# Patient Record
Sex: Male | Born: 1935
Health system: Southern US, Community
[De-identification: ages and names within clinical notes are randomized; demographics above are authoritative.]

## PROBLEM LIST (undated history)

## (undated) DIAGNOSIS — K760 Fatty (change of) liver, not elsewhere classified: Secondary | ICD-10-CM

## (undated) DIAGNOSIS — M1990 Unspecified osteoarthritis, unspecified site: Secondary | ICD-10-CM

## (undated) DIAGNOSIS — I10 Essential (primary) hypertension: Secondary | ICD-10-CM

## (undated) DIAGNOSIS — I472 Ventricular tachycardia, unspecified: Secondary | ICD-10-CM

## (undated) DIAGNOSIS — I5022 Chronic systolic (congestive) heart failure: Secondary | ICD-10-CM

## (undated) DIAGNOSIS — Z91199 Patient's noncompliance with other medical treatment and regimen due to unspecified reason: Secondary | ICD-10-CM

## (undated) DIAGNOSIS — I4891 Unspecified atrial fibrillation: Secondary | ICD-10-CM

## (undated) DIAGNOSIS — K219 Gastro-esophageal reflux disease without esophagitis: Secondary | ICD-10-CM

## (undated) DIAGNOSIS — N289 Disorder of kidney and ureter, unspecified: Secondary | ICD-10-CM

## (undated) DIAGNOSIS — J9601 Acute respiratory failure with hypoxia: Secondary | ICD-10-CM

## (undated) DIAGNOSIS — I739 Peripheral vascular disease, unspecified: Secondary | ICD-10-CM

## (undated) DIAGNOSIS — M316 Other giant cell arteritis: Secondary | ICD-10-CM

## (undated) DIAGNOSIS — I071 Rheumatic tricuspid insufficiency: Secondary | ICD-10-CM

## (undated) DIAGNOSIS — E785 Hyperlipidemia, unspecified: Secondary | ICD-10-CM

## (undated) DIAGNOSIS — N182 Chronic kidney disease, stage 2 (mild): Secondary | ICD-10-CM

## (undated) DIAGNOSIS — R570 Cardiogenic shock: Secondary | ICD-10-CM

## (undated) DIAGNOSIS — R42 Dizziness and giddiness: Secondary | ICD-10-CM

## (undated) DIAGNOSIS — J45909 Unspecified asthma, uncomplicated: Secondary | ICD-10-CM

## (undated) DIAGNOSIS — I350 Nonrheumatic aortic (valve) stenosis: Secondary | ICD-10-CM

## (undated) DIAGNOSIS — I679 Cerebrovascular disease, unspecified: Secondary | ICD-10-CM

## (undated) DIAGNOSIS — R0600 Dyspnea, unspecified: Secondary | ICD-10-CM

## (undated) DIAGNOSIS — I499 Cardiac arrhythmia, unspecified: Secondary | ICD-10-CM

## (undated) DIAGNOSIS — I340 Nonrheumatic mitral (valve) insufficiency: Secondary | ICD-10-CM

## (undated) DIAGNOSIS — R7989 Other specified abnormal findings of blood chemistry: Secondary | ICD-10-CM

## (undated) DIAGNOSIS — I482 Chronic atrial fibrillation, unspecified: Secondary | ICD-10-CM

## (undated) DIAGNOSIS — Z952 Presence of prosthetic heart valve: Secondary | ICD-10-CM

## (undated) DIAGNOSIS — R0609 Other forms of dyspnea: Secondary | ICD-10-CM

## (undated) DIAGNOSIS — Z9119 Patient's noncompliance with other medical treatment and regimen: Secondary | ICD-10-CM

## (undated) DIAGNOSIS — R778 Other specified abnormalities of plasma proteins: Secondary | ICD-10-CM

## (undated) HISTORY — DX: Unspecified atrial fibrillation: I48.91

## (undated) HISTORY — PX: ROTATOR CUFF REPAIR: SHX139

## (undated) HISTORY — DX: Other specified abnormalities of plasma proteins: R77.8

## (undated) HISTORY — DX: Nonrheumatic mitral (valve) insufficiency: I34.0

## (undated) HISTORY — DX: Fatty (change of) liver, not elsewhere classified: K76.0

## (undated) HISTORY — DX: Patient's noncompliance with other medical treatment and regimen due to unspecified reason: Z91.199

## (undated) HISTORY — DX: Nonrheumatic aortic (valve) stenosis: I35.0

## (undated) HISTORY — DX: Gastro-esophageal reflux disease without esophagitis: K21.9

## (undated) HISTORY — DX: Other forms of dyspnea: R06.09

## (undated) HISTORY — DX: Ventricular tachycardia, unspecified: I47.20

## (undated) HISTORY — DX: Unspecified osteoarthritis, unspecified site: M19.90

## (undated) HISTORY — DX: Hyperlipidemia, unspecified: E78.5

## (undated) HISTORY — DX: Ventricular tachycardia: I47.2

## (undated) HISTORY — DX: Chronic systolic (congestive) heart failure: I50.22

## (undated) HISTORY — DX: Dyspnea, unspecified: R06.00

## (undated) HISTORY — DX: Acute respiratory failure with hypoxia: J96.01

## (undated) HISTORY — DX: Cardiac arrhythmia, unspecified: I49.9

## (undated) HISTORY — DX: Rheumatic tricuspid insufficiency: I07.1

## (undated) HISTORY — DX: Chronic kidney disease, stage 2 (mild): N18.2

## (undated) HISTORY — DX: Patient's noncompliance with other medical treatment and regimen: Z91.19

## (undated) HISTORY — DX: Peripheral vascular disease, unspecified: I73.9

## (undated) HISTORY — DX: Cardiogenic shock: R57.0

## (undated) HISTORY — DX: Dizziness and giddiness: R42

## (undated) HISTORY — PX: URETHRAL STRICTURE DILATATION: SHX477

## (undated) HISTORY — DX: Other specified abnormal findings of blood chemistry: R79.89

## (undated) HISTORY — DX: Chronic atrial fibrillation, unspecified: I48.20

## (undated) HISTORY — DX: Cerebrovascular disease, unspecified: I67.9

---

## 1978-11-14 HISTORY — PX: LIPOMA EXCISION: SHX5283

## 1998-11-14 HISTORY — PX: ORIF ANKLE FRACTURE: SUR919

## 2000-11-14 HISTORY — PX: COLONOSCOPY: SHX174

## 2001-06-12 ENCOUNTER — Ambulatory Visit (HOSPITAL_COMMUNITY): Admission: RE | Admit: 2001-06-12 | Discharge: 2001-06-12 | Payer: Self-pay | Admitting: Internal Medicine

## 2002-06-24 ENCOUNTER — Inpatient Hospital Stay (HOSPITAL_COMMUNITY): Admission: AD | Admit: 2002-06-24 | Discharge: 2002-06-27 | Payer: Self-pay | Admitting: Family Medicine

## 2002-06-24 ENCOUNTER — Encounter: Payer: Self-pay | Admitting: Family Medicine

## 2002-06-27 ENCOUNTER — Encounter: Payer: Self-pay | Admitting: Family Medicine

## 2004-05-31 ENCOUNTER — Ambulatory Visit (HOSPITAL_COMMUNITY): Admission: RE | Admit: 2004-05-31 | Discharge: 2004-05-31 | Payer: Self-pay | Admitting: Family Medicine

## 2004-09-15 ENCOUNTER — Ambulatory Visit (HOSPITAL_COMMUNITY): Admission: RE | Admit: 2004-09-15 | Discharge: 2004-09-15 | Payer: Self-pay | Admitting: Family Medicine

## 2004-09-16 ENCOUNTER — Ambulatory Visit: Payer: Self-pay | Admitting: Cardiology

## 2004-09-30 ENCOUNTER — Ambulatory Visit (HOSPITAL_COMMUNITY): Admission: RE | Admit: 2004-09-30 | Discharge: 2004-09-30 | Payer: Self-pay | Admitting: Cardiology

## 2004-09-30 ENCOUNTER — Ambulatory Visit: Payer: Self-pay | Admitting: Cardiology

## 2004-10-18 ENCOUNTER — Ambulatory Visit: Payer: Self-pay | Admitting: Cardiology

## 2005-11-14 DIAGNOSIS — I350 Nonrheumatic aortic (valve) stenosis: Secondary | ICD-10-CM

## 2005-11-14 HISTORY — DX: Nonrheumatic aortic (valve) stenosis: I35.0

## 2006-05-08 ENCOUNTER — Encounter: Admission: RE | Admit: 2006-05-08 | Discharge: 2006-05-08 | Payer: Self-pay | Admitting: Orthopaedic Surgery

## 2006-05-09 ENCOUNTER — Ambulatory Visit: Payer: Self-pay | Admitting: Cardiology

## 2006-05-10 ENCOUNTER — Ambulatory Visit (HOSPITAL_COMMUNITY): Admission: RE | Admit: 2006-05-10 | Discharge: 2006-05-10 | Payer: Self-pay | Admitting: Family Medicine

## 2006-05-10 ENCOUNTER — Ambulatory Visit: Payer: Self-pay | Admitting: *Deleted

## 2006-05-18 ENCOUNTER — Ambulatory Visit (HOSPITAL_BASED_OUTPATIENT_CLINIC_OR_DEPARTMENT_OTHER): Admission: RE | Admit: 2006-05-18 | Discharge: 2006-05-19 | Payer: Self-pay | Admitting: Orthopaedic Surgery

## 2006-05-23 ENCOUNTER — Ambulatory Visit: Payer: Self-pay | Admitting: Cardiology

## 2006-05-26 ENCOUNTER — Ambulatory Visit: Payer: Self-pay | Admitting: Cardiology

## 2006-05-30 ENCOUNTER — Encounter (HOSPITAL_COMMUNITY): Admission: RE | Admit: 2006-05-30 | Discharge: 2006-06-29 | Payer: Self-pay | Admitting: Orthopaedic Surgery

## 2006-06-02 ENCOUNTER — Ambulatory Visit: Payer: Self-pay | Admitting: *Deleted

## 2006-06-14 ENCOUNTER — Ambulatory Visit: Payer: Self-pay | Admitting: Cardiology

## 2006-06-30 ENCOUNTER — Encounter (HOSPITAL_COMMUNITY): Admission: RE | Admit: 2006-06-30 | Discharge: 2006-07-30 | Payer: Self-pay | Admitting: Orthopaedic Surgery

## 2006-08-07 ENCOUNTER — Ambulatory Visit: Payer: Self-pay | Admitting: Cardiology

## 2006-08-15 ENCOUNTER — Ambulatory Visit: Payer: Self-pay | Admitting: Cardiology

## 2006-09-05 ENCOUNTER — Ambulatory Visit: Payer: Self-pay | Admitting: *Deleted

## 2006-10-10 ENCOUNTER — Ambulatory Visit: Payer: Self-pay | Admitting: Cardiovascular Disease

## 2006-11-09 ENCOUNTER — Ambulatory Visit: Payer: Self-pay | Admitting: Cardiology

## 2006-12-14 ENCOUNTER — Ambulatory Visit: Payer: Self-pay | Admitting: Cardiology

## 2006-12-21 ENCOUNTER — Ambulatory Visit: Payer: Self-pay | Admitting: Cardiology

## 2007-01-11 ENCOUNTER — Ambulatory Visit: Payer: Self-pay | Admitting: Cardiology

## 2007-01-15 ENCOUNTER — Ambulatory Visit: Payer: Self-pay | Admitting: Cardiology

## 2007-01-15 ENCOUNTER — Encounter (HOSPITAL_COMMUNITY): Admission: RE | Admit: 2007-01-15 | Discharge: 2007-02-14 | Payer: Self-pay | Admitting: Cardiology

## 2007-01-25 ENCOUNTER — Ambulatory Visit: Payer: Self-pay | Admitting: Cardiology

## 2007-02-06 ENCOUNTER — Ambulatory Visit: Payer: Self-pay | Admitting: *Deleted

## 2007-03-01 ENCOUNTER — Ambulatory Visit: Payer: Self-pay | Admitting: Cardiology

## 2007-03-13 ENCOUNTER — Ambulatory Visit: Payer: Self-pay | Admitting: Cardiovascular Disease

## 2007-04-10 ENCOUNTER — Ambulatory Visit: Payer: Self-pay | Admitting: Cardiology

## 2007-04-11 ENCOUNTER — Ambulatory Visit: Payer: Self-pay | Admitting: Cardiology

## 2007-04-23 ENCOUNTER — Ambulatory Visit: Payer: Self-pay | Admitting: Internal Medicine

## 2007-05-29 ENCOUNTER — Ambulatory Visit: Payer: Self-pay | Admitting: Cardiology

## 2007-06-04 ENCOUNTER — Ambulatory Visit: Payer: Self-pay | Admitting: Cardiology

## 2007-07-05 ENCOUNTER — Ambulatory Visit: Payer: Self-pay | Admitting: Cardiology

## 2007-07-12 ENCOUNTER — Ambulatory Visit: Payer: Self-pay | Admitting: Cardiology

## 2007-07-19 ENCOUNTER — Ambulatory Visit: Payer: Self-pay | Admitting: Cardiology

## 2007-08-20 ENCOUNTER — Ambulatory Visit: Payer: Self-pay | Admitting: Cardiovascular Disease

## 2007-09-25 ENCOUNTER — Ambulatory Visit: Payer: Self-pay | Admitting: Cardiology

## 2007-10-10 ENCOUNTER — Ambulatory Visit: Payer: Self-pay | Admitting: Cardiology

## 2007-10-29 ENCOUNTER — Ambulatory Visit (HOSPITAL_COMMUNITY): Admission: RE | Admit: 2007-10-29 | Discharge: 2007-10-29 | Payer: Self-pay | Admitting: Family Medicine

## 2007-11-02 ENCOUNTER — Encounter (HOSPITAL_COMMUNITY): Admission: RE | Admit: 2007-11-02 | Discharge: 2007-11-14 | Payer: Self-pay | Admitting: Family Medicine

## 2007-11-13 ENCOUNTER — Ambulatory Visit: Payer: Self-pay | Admitting: Cardiovascular Disease

## 2007-12-05 ENCOUNTER — Ambulatory Visit: Payer: Self-pay | Admitting: Cardiology

## 2008-01-03 ENCOUNTER — Ambulatory Visit: Payer: Self-pay | Admitting: Cardiovascular Disease

## 2008-02-05 ENCOUNTER — Ambulatory Visit: Payer: Self-pay | Admitting: Cardiology

## 2008-04-15 ENCOUNTER — Emergency Department (HOSPITAL_COMMUNITY): Admission: EM | Admit: 2008-04-15 | Discharge: 2008-04-15 | Payer: Self-pay | Admitting: Emergency Medicine

## 2010-07-15 DIAGNOSIS — I679 Cerebrovascular disease, unspecified: Secondary | ICD-10-CM

## 2010-07-15 HISTORY — DX: Cerebrovascular disease, unspecified: I67.9

## 2010-08-04 ENCOUNTER — Emergency Department (HOSPITAL_COMMUNITY): Admission: EM | Admit: 2010-08-04 | Discharge: 2010-08-04 | Payer: Self-pay | Admitting: Emergency Medicine

## 2010-08-12 ENCOUNTER — Ambulatory Visit (HOSPITAL_COMMUNITY): Admission: RE | Admit: 2010-08-12 | Discharge: 2010-08-12 | Payer: Self-pay | Admitting: Family Medicine

## 2011-01-27 LAB — BASIC METABOLIC PANEL
BUN: 10 mg/dL (ref 6–23)
CO2: 28 mEq/L (ref 19–32)
Calcium: 9.1 mg/dL (ref 8.4–10.5)
Chloride: 104 mEq/L (ref 96–112)
Creatinine, Ser: 0.9 mg/dL (ref 0.4–1.5)
GFR calc Af Amer: >60 mL/min (ref >60)
GFR calc non Af Amer: >60 mL/min (ref >60)
Glucose, Bld: 169 mg/dL — ABNORMAL HIGH (ref 70–99)
Potassium: 4 mEq/L (ref 3.5–5.1)
Sodium: 137 mEq/L (ref 135–145)

## 2011-01-27 LAB — DIFFERENTIAL
Basophils Absolute: 0 10*3/uL (ref 0.0–0.1)
Basophils Relative: 0 % (ref 0–1)
Eosinophils Absolute: 0.1 10*3/uL (ref 0.0–0.7)
Eosinophils Relative: 1 % (ref 0–5)
Lymphocytes Relative: 10 % — ABNORMAL LOW (ref 12–46)
Lymphs Abs: 0.7 10*3/uL (ref 0.7–4.0)
Monocytes Absolute: 0.5 10*3/uL (ref 0.1–1.0)
Monocytes Relative: 6 % (ref 3–12)
Neutro Abs: 6.3 10*3/uL (ref 1.7–7.7)
Neutrophils Relative %: 83 % — ABNORMAL HIGH (ref 43–77)

## 2011-01-27 LAB — CBC
HCT: 44.3 % (ref 39.0–52.0)
Hemoglobin: 15.1 g/dL (ref 13.0–17.0)
MCH: 31.9 pg (ref 26.0–34.0)
MCHC: 34.1 g/dL (ref 30.0–36.0)
MCV: 93.6 fL (ref 78.0–100.0)
Platelets: 210 10*3/uL (ref 150–400)
RBC: 4.73 MIL/uL (ref 4.22–5.81)
RDW: 13.2 % (ref 11.5–15.5)
WBC: 7.6 10*3/uL (ref 4.0–10.5)

## 2011-01-27 LAB — GLUCOSE, CAPILLARY: Glucose-Capillary: 171 mg/dL — ABNORMAL HIGH (ref 70–99)

## 2011-01-27 LAB — PROTIME-INR
INR: 1.88 — ABNORMAL HIGH (ref 0.00–1.49)
Prothrombin Time: 21.8 seconds — ABNORMAL HIGH (ref 11.6–15.2)

## 2011-01-27 LAB — APTT: aPTT: 39 seconds — ABNORMAL HIGH (ref 24–37)

## 2011-03-29 NOTE — Letter (Signed)
February 05, 2008    [name] A. Gerda Diss, MD  [address]  [place], [place]  [address]   RE:  [name] [name], [name]  MRN:  [id number]  /  DOB:  October 28, 1932   Dear [name] [name] [name]:   [name] [name] [name] returns to the office as scheduled for continued assessment  and treatment of paroxysmal atrial fibrillation.  Since his last visit  he describes a number of issues.  The exact time course of these is  unclear.  He has multiple bowel movements every morning that may be  soft, but are not clearly liquid.  He reports malaise and easy  fatigability.  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  He is about to attend a funeral  for a middle-aged nephew.  His last medication was renewed improperly  resulting in a prescription for metoprolol 25 mg b.i.d. instead of  Toprol 25 mg daily.  His other medications include Prilosec 20 mg daily,  warfarin as directed with stable and therapeutic anticoagulation,  diltiazem 120 mg daily, glyburide 3 mg daily, fexofenadine 180 mg daily,  zolpidem 10 mg q.h.s.  Since his last visit he became intolerant to  [facility].   PHYSICAL EXAMINATION:  GENERAL:  [name] [name] [name] is not his usual exuberant  self.  VITAL SIGNS:  Weight is 258 5 pounds more than seven months ago, blood  pressure 115/75, heart rate 78 and regular, respirations 18.  NECK:  No jugular venous distention; no carotid bruits.  LUNGS:  Clear.  CARDIAC:  Normal first and second heart sounds; fourth heart sound  present.  ABDOMEN:  Soft and nontender; no organomegaly.  EXTREMITIES:  No edema.   LABORATORY DATA:  INR 2.5.   IMPRESSION:  [name] [name] [name] is stable from a cardiovascular standpoint.  I  suggested he schedule a visit to discuss some of his other issues with  you and for appropriate examination.  He had stool for Hemoccult testing  less than a year ago that was negative.  We will obtain a CBC and  chemistry profile.  There appears no point in attempting to find a  lipid  lowering regimen for him.  He has already failed six different agents.  Fortunately he does not have known coronary disease and hopefully will  not be adversely affected by his moderately elevated total and LDL  cholesterol.  The copay for management of anticoagulation will be lower  in your office than mine.  Accordingly, [name] [name] [name] is requesting that  [name] task be transferred to you.  I will plan to see [name] [name] [name]  again in six months.    Sincerely,      [name] Friends. Dietrich Pates, MD, [facility]  Electronically Signed    RMR/MedQ  DD: 02/05/2008  DT: 02/05/2008  Job #: [id number]

## 2011-03-29 NOTE — Letter (Signed)
Apr 11, 2007    Scott A. Gerda Diss, MD  [address]  [place], [place] [address]   RE:  KELYN, KOSKELA  MRN:  [id number]  /  DOB:  1932/11/04   Dear [name]:   Mr. Pound is seen in the office today after having complained of  fatigue at a Coumadin clinic visit.  He also tried to take Niaspan, but  developed an intolerable flush with the first dose.  He did not use  aspirin as I suggested for fear of an adverse indirection with Warfarin.  He has not noted palpitations.  He has no orthopnea or PND.  There has  been no edema.   It is unclear exactly who he is taking all of his medications.  He is  supposed to be using Prilosec OTC.  Warfarin dosage has been fairly  consistent and therapeutic.  He is supposed to be using Lodine on a  p.r.n. basis.  He is also scheduled for Cardizem 120 mg daily, glyburide  3 mg q.a.m., Fexofenadine 180 mg daily, Zolpidem 10 mg q.h.s., Toprol 25  mg daily and Welchol 650 mg t.i.d.  He has a blister pack from the  pharmacy that appears to indicate once a day scheduling.   PHYSICAL EXAMINATION:  GENERAL:  Pleasant gentleman in no acute  distress.  VITAL SIGNS:  Weight 251, stable.  Blood pressure 120/75, heart rate 68  and irregular, respirations 16.  NECK:  No jugular venous distention.  LUNGS:  Clear.  CARDIAC:  Irregular rhythm.  Otherwise, unremarkable.  EXTREMITIES:  Trace edema.   IMPRESSION:  Mr.  Roger has nonspecific symptoms.  TSH, CBC and  echocardiogram have been checked within the past 6-12 months.  He is  experiencing significant stress.  His sister is under hospice care and  expected to die soon.  His wife requires considerable attention from  him.  He will call my office or yours if symptoms persist.  Otherwise, I  will plan to see him later this year as previously scheduled.   He will increase WelChol to 625 mg t.i.d. as previously suggested.  He  will try to take Niaspan 250 mg with preceding aspirin.  If this is  successful,  we will try to very gradually increase the dose.    Sincerely,      Gerrit Friends. Dietrich Pates, MD, [facility]  Electronically Signed    RMR/MedQ  DD: 04/11/2007  DT: 04/11/2007  Job #: [id number]

## 2011-03-29 NOTE — Letter (Signed)
June 04, 2007    [name] A. Gerda Diss, MD  [address]  [place], [place] [address]   RE:  [name] [name], [name] [name]  MRN:  [id number]  /  DOB:  [birth year]   Dear [name] [name],   [name] [name] returns to the office for continued assessment and treatment  of hypertension, dyslipidemia, atrial fibrillation, and symptomatic  spells of weakness.  Since the last visit, he has been stable.  Some  days he feels fine and plays golf.  Other days, he notes extreme  fatigue.  He sleeps poorly, but this is not definitely related to  sleeplessness.  He does have some exertional dyspnea.  He has had no  lightheadedness nor syncope.   His current medications are somewhat uncertain.  For instance, we have  on our list Lodine, but he has no familiarity with that medication or  with any arthritis medication.  We will make an effort to be absolutely  certain regarding his medications.  He is taking Zolpidem every night.  I suggested he use this more on a p.r.n. basis.   On exam, pleasant gentleman in no acute distress.  The weight is 252, 1  pound more than in May.  Blood pressure 125/75, heart rate 85 and  regular, respirations 16.  NECK:  No jugular venous distention; normal carotid upstrokes without  bruits.  LUNGS:  Clear.  CARDIAC:  Normal first and second heart sounds; irregular rhythm;  otherwise unremarkable.  ABDOMEN:  Soft and nontender, no organomegaly.  EXTREMITIES:  Trace edema on the right.   IMPRESSION:  [name] [name] is generally doing fairly well.  Management of  hyperlipidemia is challenging since he is unable to take either statins  or niacin.  We will increase Welchol to 2 tablets t.i.d. and add  Ezetimibe 10 mg daily.  Blood pressure control is adequate.  His symptom  of spells are of unclear etiology.  We will rule out tachycardia or  bradycardia with an event recorder.  With the modest doses of metoprolol  and diltiazem he is taking, I doubt whether he has symptomatic  hypotension.  We will  check orthostatic vital signs.  I will reassess  this nice gentleman again in one month after event recording has been  completed.    Sincerely,      [name] Friends. Dietrich Pates, MD, [facility]  Electronically Signed    RMR/MedQ  DD: 06/04/2007  DT: 06/04/2007  Job #: [id number]

## 2011-03-29 NOTE — Letter (Signed)
July 05, 2007    Scott A. Gerda Diss, MD  [address]  [place], [place] [address]   RE:  HOLLISTER, WESSLER  MRN:  [id number]  /  DOB:  [birth year]   Dear [name]:   Mr. Fonder returns to the office for continued assessment and treatment  of intermittent dyspnea and fatigue as well as paroxysmal atrial  fibrillation.  Since his last visit, he has felt a good deal better.  He  carried an event recorder for a month but never indicated a symptomatic  episode.  Multiple strips were sent automatically showing atrial  fibrillation with good control of heart rate.  There was no substantial  bradycardia or tachycardia.  The longest pause recorded was 2.5 seconds.   MEDICATIONS:  Unchanged from his last visit.   PHYSICAL EXAMINATION:  GENERAL:  A pleasant gentleman in no acute  distress.  VITAL SIGNS:  The weight is 253, 1 pound more than in July.  Blood  pressure  115/75, heart rate is 60 and irregular, respirations  18.  NECK:  No jugular venous distention.  LUNGS:  Clear.  CARDIAC:  Normal 1st and 2nd heart sounds; a grade 1/6 holosystolic  apical murmur.  ABDOMEN:  Soft and nontender; no organomegaly.  EXTREMITIES:  Trace edema.   IMPRESSION:  Mr. Yasuda is doing well from a symptomatic standpoint.   He is tolerating Welchol 2 tablets b.i.d. and ezetimibe 10 mg daily.  A  repeat fasting lipid profile will be obtained.  We will continue to  monitor anticoagulation in clinic and plan a return office visit in 7  months.    Sincerely,      Gerrit Friends. Dietrich Pates, MD, [facility]  Electronically Signed    RMR/MedQ  DD: 07/05/2007  DT: 07/06/2007  Job #: [id number]

## 2011-04-01 NOTE — Procedures (Signed)
NAMESANEL, STEMMER                ACCOUNT NO.:  [id number]   MEDICAL RECORD NO.:  [id number]          PATIENT TYPE:  OUT   LOCATION:  RAD                           FACILITY:  [facility]   PHYSICIAN:  [place] Bing, M.D.  DATE OF BIRTH:  Sep 07, 1932   DATE OF PROCEDURE:  09/30/2004  DATE OF DISCHARGE:                                  ECHOCARDIOGRAM   REFERRING PHYSICIANS:  Dr. Lilyan Punt and Dr. Dietrich Pates.   CLINICAL DATA:  This is a 76 year old gentleman with chest pain and  diabetes.   M-MODE:  1.  Aorta 3.3.  2.  Left atrium 4.0.  3.  Septum 1.6.  4.  Posterior wall 1.3.  5.  LV diastole 4.6.  6.  LV systole 3.3.   1.  Technically adequate echocardiographic study.  2.  Mild to moderate left atrial enlargement; mild right atrial enlargement.      Right ventricular size and function.  3.  Mild aortic valvular sclerosis; mild calcification of the proximal      ascending aorta.  4.  Normal tricuspid valve; trivial regurgitation.  5.  Normal pulmonary valve and proximal pulmonary artery.  6.  Slight mitral valve calcification with mild annular calcification and      mild regurgitation.  7.  Normal internal dimension of the left ventricle; mild hypertrophy with      disproportionate proximal septal thickening.  Normal regional and global      LV systolic function.  8.  Normal IVC.  9.  No change when compared to a prior study of June 25, 2002.     [place]   RR/MEDQ  D:  09/30/2004  T:  10/01/2004  Job:  [id number]

## 2011-04-01 NOTE — H&P (Signed)
NAME:  [name] [name], [name] [name]                          ACCOUNT NO.:  [id number]   MEDICAL RECORD NO.:  [id number]                   PATIENT TYPE:  INP   LOCATION:  A222                                 FACILITY:  [facility]   PHYSICIAN:  Donna Bernard, M.D.             DATE OF BIRTH:  05-10-1932   DATE OF ADMISSION:  06/24/2002  DATE OF DISCHARGE:                                HISTORY & PHYSICAL   CHIEF COMPLAINT:  Chest pain.   SUBJECTIVE:  This patient is a 77 year old white male with a history of  hyperlipidemia, who presents to the office the day of admission with  complaints of chest pain.  He stated that his chest discomfort was primarily  substernal with no significant radiation.  At times it felt like a deep  pressure sensation.  Generally, it occurred at rest, although it could occur  with exertion.  The patient did feel short of breath intermittently in  addition.  He notes no diaphoresis or nausea with his symptoms.  He relates  a very strong family history of coronary artery disease with siblings with  known CAD.  The patient smoked up until approximately 15 years ago.  He also  notes ongoing stress with his sick wife.  Recently, Lexapro 10 mg daily was  initiated a number of weeks ago.  This discomfort has been off and on for a  number of months but seems to have worsened in the last few weeks.  The  patient claims compliance with his usual medications which include Lipitor  10 mg daily, Lexapro 10 mg daily, Darvocet-N 100 one q.4-6h. p.r.n. and  Allegra-D b.i.d. p.r.n.   FAMILY HISTORY:  Positive for coronary artery disease, hypertension, type 2  diabetes, congestive heart failure.   SOCIAL HISTORY:  The patient is married with a sick wife at home.  No  tobacco or alcohol abuse.   REVIEW OF SYSTEMS:  Review of systems otherwise negative.   PHYSICAL EXAMINATION:  VITAL SIGNS:  BP 132/82.  Afebrile.  Weight 251.  GENERAL:  Patient is alert, somewhat anxious appearing.  HEENT:  Normal.  NECK:  Supple.  No lymphadenopathy.  No JVD.  Thyroid nonpalpable.  LUNGS:  Clear.  HEART:  Regular rhythm.  CHEST:  No chest wall tenderness.  ABDOMEN:  Nontender.  No masses.  No rebound.  No guarding.  EXTREMITIES:  Normal.  NEUROLOGIC:  Exam intact.   LABORATORY AND ACCESSORY CLINICAL DATA:  EKG:  Normal sinus rhythm.  No  significant ST-T changes.   IMPRESSION:  1. Chest pain.  Though it is somewhat atypical in presentation, the patient     has significant list of risk factors.  It could well represent unstable     angina.  2. Hyperlipidemia.  Of note, LDL was over 200 before initiating the Lipitor.  3. Fatigue with element of depression.  4. Strong family history of coronary artery disease.  PLAN:  As per orders.                                               Donna Bernard, M.D.    Karie Chimera  D:  06/26/2002  T:  06/26/2002  Job:  [id number]

## 2011-04-01 NOTE — Discharge Summary (Signed)
   [name], [name]                            ACCOUNT NO.:  [id number]   MEDICAL RECORD NO.:  [id number]                  PATIENT TYPE:   LOCATION:                                       FACILITY:   PHYSICIAN:  Donna Bernard, M.D.             DATE OF BIRTH:   DATE OF ADMISSION:  DATE OF DISCHARGE:  06/27/2002                                 DISCHARGE SUMMARY   FINAL DIAGNOSES:  1. Chest pain, myocardial infarction ruled out.  2. Hyperlipidemia.  3. Chronic fatigue.   FINAL DISPOSITION:  1. The patient discharged to home.  2. Discharge medications:  The patient maintained on same medications.   FOLLOW UP:  Follow up with Dr. Lilyan Punt in one week.   INITIAL HISTORY AND PHYSICAL:  Please see H&P as dictated.   HOSPITAL COURSE:  This patient is a 77 year old white male with history of  hyperlipidemia who presented to the office on the day of admission with  complaints of chest discomfort.  The discomfort felt like a deep pressure  sensation.  It occurred both at rest and with exertion.  The patient noted  compliance with his medications which include Lexapro 10 mg daily, Lipitor  10 mg daily, Darvocet p.r.n. for pain.  The patient was admitted to the  hospital.  Serial cardiac enzymes were performed.  These proved to be  negative.  Cardiology folks were consulted.  They did an echocardiogram  which showed some left ventricular hypertrophy and some left atrial  dilatation.  In addition, after the enzymes were negative, they recommended  going ahead and doing a stress Cardiolite.  There was some delay in the  performance of this.  When the result came back normal, it was felt safe for  the patient to go home with diagnosis and disposition as noted above.                                               Donna Bernard, M.D.    WSL/MEDQ  D:  10/26/2002  T:  10/27/2002  Job:  [id number]

## 2011-04-01 NOTE — Group Therapy Note (Signed)
   NAME:  [name] [name], [name] [name]                          ACCOUNT NO.:  [id number]   MEDICAL RECORD NO.:  [id number]                   PATIENT TYPE:  INP   LOCATION:  A222                                 FACILITY:  [facility]   PHYSICIAN:  Pricilla Riffle, M.D. [facility]             DATE OF BIRTH:  [birth year]   DATE OF PROCEDURE:  06/27/2002  DATE OF DISCHARGE:                                   PROGRESS NOTE   PROCEDURE:  Exercise Cardiolite.   BRIEF HISTORY:  This patient is a pleasant, 77 year old male admitted to  [facility] on 06/24/02 for evaluation of chest pain.  He has a  history of elevated lipids plus a family history of coronary artery disease  and a history of a right ankle fracture.  He was seen in consultation by  cardiology, and scheduled for an exercise Cardiolite after his enzymes were  found to be negative.   DESCRIPTION OF PROCEDURE:  Prior to the study, today the patient had no  complaints.  His baseline EKG showed sinus rhythm, rate 59 beats per minute  without ischemic changes.  Blood pressure was 160/84.  Target heart rate was  130 beats/minute.   The patient was able to exercise for a total of 4 minutes and 52 seconds.  His maximum heart rate was 142 beats/minute.  He did have some PACs during  the test.  He was injected at 3 minutes and 38 seconds into the study at  which time his heart rate was 128.   He did have a hypertensive response to exercise with the blood pressure of  210/70 during exercise and 198/64 in recovery.   The test was concluded secondary to leg weakness and fatigue.  The patient  had no chest pain.  There was no ischemic changes on the EKG.  In recovery  the blood pressure was 230/40; it eventually came down to 198/64.  The  patient, as noted, had no chest pain.  The final images are pending at the  time of this dictation.     Delton See, PA [facility]                    Pricilla Riffle, M.D. [facility]    DR/MEDQ  D:  06/27/2002  T:  07/01/2002  Job:   [id number]

## 2011-04-01 NOTE — Letter (Signed)
March 01, 2007    [name] [name] [name] A. Gerda Diss, MD  [address]  [place], [place] [address]   RE:  [name] [name] [name], [name] [name]  MRN:  [id number]  /  DOB:  1932-01-24   Dear [name] [name] [name] [name] [name]:   [name] [name] [name] [name] [name] returns to the office for continued assessment and treatment  of atrial fibrillation and dyslipidemia.  Since his last visit, he feels  a good deal better.  He has been quite active, including splitting large  amounts of wood without much in the way of problems.  He does note  fatigue at night and early in the morning, but this resolves as he  starts to go through his day.  He has noted no light-headedness, nor  syncope.  He experiences no palpitations.  He has no chest pain.   CURRENT MEDICATIONS:  1. Prilosec 20 mg daily.  2. Ambien 5 mg nightly.  3. Warfarin as directed with stable and therapeutic anticoagulation.  4. Lodine 400 mg b.i.d. p.r.n.  5. Diltiazem 120 mg daily.  6. Glyburide 3 mg q. a.m.  7. Fexofenadine 180 mg daily.  8. Toprol 25 mg daily.   EXAM:  Pleasant, burly gentleman in no acute distress.  The weight is 252, 3 pounds more than last month.  Blood pressure  125/70, heart rate 60 and irregular, respirations 16.  NECK:  No jugular venous distension; normal carotid upstrokes without  bruits.  LUNGS:  Clear.  CARDIAC:  Normal first and second heart sounds.  ABDOMEN:  Soft and nontender; no organomegaly.  EXTREMITIES:  With 1/2+ ankle edema; distal pulses intact.   RHYTHM STRIP:  Atrial fibrillation with a controlled ventricular  response; heart rate is 60 beats per minute.   IMPRESSION:  [name] [name] [name] [name] [name] feels better after adjustment of his medications  by stopping pravastatin and adding Toprol at a low dose.  Due to  bradycardia at rest, further increases in his rate-control medicine are  not advisable.  He feels better having stopped pravastatin.  This is the  third Statin he did not tolerate.  Accordingly, we will obtain a lipid  profile and provide him with second-tier therapy.  Stool  for Hemoccult  testing and CBC were obtained late in 2007 and were fine.  Vaccinations  are up to date.  I will plan to reassess this nice gentleman again in 6  months.    Sincerely,      [name] [name] [name] Friends. Dietrich Pates, MD, [facility]  Electronically Signed    RMR/MedQ  DD: 03/01/2007  DT: 03/01/2007  Job #: [id number]

## 2011-04-01 NOTE — Consult Note (Signed)
NAME:  [name] [name], [name] [name]                          ACCOUNT NO.:  [id number]   MEDICAL RECORD NO.:  [id number]                   PATIENT TYPE:  INP   LOCATION:  A222                                 FACILITY:  [facility]   PHYSICIAN:  Luis Abed, M.D. [facility]           DATE OF BIRTH:  1932/03/01   DATE OF CONSULTATION:  DATE OF DISCHARGE:  06/27/2002                              CARDIOLOGY CONSULTATION   The patient has chest discomfort.  He has a history of increased lipids and  a family history of coronary disease.  He has had epigastric discomfort for  about a month.  He has been under significant stress recently.  This  includes the fact that he needs care at home to help with his wife's  colostomy and his son is on dialysis.  The discomfort can last from five to  30 minutes.   ALLERGIES:  None known.   MEDICATIONS:  Aspirin, lactulose, Zocor 20, Xanax p.r.n.   PAST MEDICAL HISTORY:  See the complete list below.   SOCIAL HISTORY:  The patient is married.  Living in __________.  As  described above, he has significant stresses in his life.  He quit smoking  approximately 15 years ago.   FAMILY HISTORY:  The patient does have a sister with a history of CABG.   REVIEW OF SYMPTOMS:  GENERAL:  He is doing well other than feeling fatigued.  He complains of some chills, but no documented fevers.  He has no major  headaches.  There are no skin problems.  He does complain of chest pain and  shortness of breath as described above.  He has no GU symptoms.  He has had  some nausea.  He also has some arthralgias.  The remainder of his review of  systems is negative.   PHYSICAL EXAMINATION:  GENERAL:  The patient is in no distress.  VITAL SIGNS:  Temperature 97, blood pressure 135/68, pulse 57.  HEENT:  No significant abnormalities.  NECK:  There are no carotid bruits.  CARDIAC:  S1 with an S2, but no clicks or significant murmurs.  Heart sounds  are distant.  LUNGS:  Clear.  SKIN:  No  significant abnormalities.  ABDOMEN:  Obese, but soft.  GENITOURINARY:  Deferred.  RECTAL:  Deferred.  EXTREMITIES:  He has no peripheral edema.  There are no obvious  musculoskeletal deformities.  NEUROLOGIC:  Grossly intact.   LABORATORY DATA:  EKG reveals no diagnostic abnormalities.  His first two  troponins are normal.  Hemoglobin is normal.    PROBLEMS:  1. Problems with constipation.  2. Hyperlipidemia.  3. Family history of coronary disease.  4. Current chest discomfort.  5. Significant stress at home.  6. Significant fatigue.   At this point I am not convinced of significant ischemic heart disease.  A 2-  D echocardiogram will be done to be sure that there is no unexplained left  ventricular dysfunction.  Cardiolite will also be done to rule out ischemic  disease.  If there are no significant findings I feel that further cardiac  work-up would not be necessary.  TSH is also to be checked.                                               Luis Abed, M.D. [facility]    JDK/MEDQ  D:  06/25/2002  T:  07/01/2002  Job:  [id number]   cc:   [name] A. Gerda Diss, M.D.

## 2011-04-01 NOTE — Letter (Signed)
January 11, 2007    Scott A. Gerda Diss, MD  [address], [address]  [place], [place] [address]   RE:  [name] [name], [name] [name]  MRN:  [id number]  /  DOB:  07-09-1932   Dear [name]:   Mr. Sigmon returns to the office for continued assessment and treatment  of paroxysmal atrial fibrillation and minimal aortic stenosis.  Since  his last visit, he describes continuing fatigue and malaise.  Nonetheless, he plays golf 3 days per week.  It is hard to pin down a  timeline and specific symptoms.  He also complains of pain in both  thighs when he walks.  He notes a warm-up effect where he feels poorly  in the morning but better as the day wears on.  He recently had a lipid  profile, CBC, and chemistry profile in your office with generally good  results, except for documentation of his known hyperlipidemia.  He  developed adverse reactions to atorvastatin and simvastatin and has not  been tried on additional medication.  His most recent echocardiogram was  approximately 18 months ago, at which time he was found to have very  mild aortic stenosis, LVH, and normal to hyperdynamic LV systolic  function.   PHYSICAL EXAMINATION:  Pleasant gentleman in no acute distress.  The weight is 251, 3 pounds less than last August.  Blood pressure  125/60, heart rate 66.  NECK:  No jugular venous distension, normal carotid upstrokes without  bruits.  LUNGS:  Clear.  CARDIAC:  Normal 1st and 2nd heart sounds, modest systolic ejection  murmur.  Normal PMI.  ABDOMEN:  Soft and nontender, no masses, no organomegaly.  EXTREMITIES:  1/2+ ankle edema, more prominent on the right.   IMPRESSION:  Mr. Sitar's symptoms are probably chronic, but he is  somewhat concerned by them.  We will proceed with an assessment of his  TSH level, and then a stress Myoview study.  Further testing may be  undertaken based upon the results of these initial studies.  Vaccinations are up to date.  I will reassess this nice gentleman after  his  exercise test has been completed.    Sincerely,      Gerrit Friends. Dietrich Pates, MD, [facility]  Electronically Signed    RMR/MedQ  DD: 01/11/2007  DT: 01/11/2007  Job #: [id number]

## 2011-04-01 NOTE — Procedures (Signed)
[name] [name], [name] [name]                ACCOUNT NO.:  [id number]   MEDICAL RECORD NO.:  [id number]          PATIENT TYPE:  OUT   LOCATION:  RAD                           FACILITY:  [facility]   PHYSICIAN:  Vida Roller, M.D.   DATE OF BIRTH:  03-Jun-1932   DATE OF PROCEDURE:  DATE OF DISCHARGE:                                  ECHOCARDIOGRAM   PROCEDURE:  Echocardiogram.   Tape #[id number].  Tape count [id number].  This is a preoperative assessment in a  gentleman with atrial fibrillation.  Technical quality of the study is  adequate.  The patient is in atrial fibrillation throughout the study.   M-mode tracings:  The aorta is 25 mm.   Left atrium 57 mm.   Septum is 15 mm.   Posterior wall is 14 mm.   Left ventricular diastolic dimension 41 mm.   Left ventricular systolic dimension 29 mm.   2-D Doppler imaging: The left ventricle is normal size.  There is preserved  LV systolic function with an estimated ejection fraction of 60-70%.  There  is mild concentric left ventricular hypertrophy with no significant wall  motion abnormalities.  The right ventricle is normal size with normal  systolic function.   The left atrium is enlarged.   Aortic valve is sclerotic.  There is some increased velocity across the  valve approximately 2 meters/second giving a peak gradient of about 15 mm of  mercury, which gives a minimal aortic stenosis.  There is trivial aortic  insufficiency.  The mitral valve has mild annular calcification with mild  regurgitation.   The tricuspid valve has trivial regurgitation.   There is no pericardial effusion.      Vida Roller, M.D.  Electronically Signed     JH/MEDQ  D:  05/10/2006  T:  05/10/2006  Job:  [id number]   cc:   [name] A. Gerda Diss, MD  Fax: [phone]

## 2011-04-01 NOTE — Letter (Signed)
June 14, 2006     Scott A. Gerda Diss, M.D.  [address]  [place]  [address]   RE:  KAZUO, DURNIL  MRN:  [id number]  /  DOB:  07/12/1932   Dear [name]:   Mr. Dovidio returns to the office for continued assessment and treatment of  paroxysmal atrial fibrillation.  He underwent right shoulder surgery without  problems.  He has been anticoagulated since then without difficulty.  He  denies all cardiopulmonary symptoms.   MEDICATIONS:  1.  Metformin 500 mg b.i.d.  2.  Prilosec 1 every day.  3.  Ambien 5 mg q.h.s.  4.  Warfarin 7.5 mg every day.  5.  Lodine 400 mg b.i.d. p.r.n., used rarely.  6.  Diltiazem 120 mg every day.   PHYSICAL EXAMINATION:  GENERAL:  A very pleasant gentleman in no acute  distress.  VITAL SIGNS:  The weight is 254, 1 pound less than in June.  Blood pressure  125/65, heart rate is 70 and somewhat irregular, respirations 16.  NECK:  No jugular venous distention; normal carotid upstrokes without  bruits.  LUNGS:  Decreased breath sounds at the bases.  Otherwise clear.  CARDIAC:  Normal first and sensation heart sounds.  Minimal basilar systolic  ejection murmur.  ABDOMEN:  Soft, nontender.  No organomegaly.  EXTREMITIES:  No edema.   Rhythm strip:  A sinus rhythm with sinus arrhythmia and blocked PACs.   Echocardiogram:  Minimal aortic stenosis; mild LVH with normal LV systolic  function; mild MR.   TSH was normal.  CBC was normal.  Stool for hemoccult was negative x3.   IMPRESSION:  Mr. Mckelvy is doing well with current therapy.  He now  demonstrates that he has paroxysmal atrial fibrillation.  Since he is  asymptomatic, no further therapy is warranted.  He is cautioned to use  Lodine as little as possible to minimize the risk of peptic ulcer disease.  He is reminded to receive influenza vaccine in you office when available.  We will continue to adjust the Warfarin dosage and plan a return office  visit in 6 months.     Sincerely,      Gerrit Friends. Dietrich Pates, MD, [facility]   RMR/MedQ  DD:  06/14/2006  DT:  06/14/2006  Job #:  [id number]

## 2011-04-01 NOTE — Procedures (Signed)
   NAME:  TRIPP, GOINS                          ACCOUNT NO.:  [id number]   MEDICAL RECORD NO.:  [id number]                   PATIENT TYPE:  INP   LOCATION:  A222                                 FACILITY:  [facility]   PHYSICIAN:  Donna Bernard, M.D.             DATE OF BIRTH:  04/25/1932   DATE OF PROCEDURE:  06/24/2002  DATE OF DISCHARGE:  06/27/2002                                EKG INTERPRETATION   EKG reveals normal sinus rhythm with no significant ST-T changes.  There is  one premature atrial contraction evident.  Diminishment of R-wave in V3  raises the possibility of an old anterior infarction.                                               Donna Bernard, M.D.    Karie Chimera  D:  12/09/2002  T:  12/09/2002  Job:  [id number]

## 2011-04-01 NOTE — Op Note (Signed)
NAMENICHOALS, HEYDE                ACCOUNT NO.:  [id number]   MEDICAL RECORD NO.:  [id number]          PATIENT TYPE:  AMB   LOCATION:  DSC                          FACILITY:  [facility]   PHYSICIAN:  Claude Manges. Whitfield, M.D.DATE OF BIRTH:  1932-02-02   DATE OF PROCEDURE:  05/18/2006  DATE OF DISCHARGE:                                 OPERATIVE REPORT   PREOPERATIVE DIAGNOSES:  1.  Rotator cuff repair with impingement right shoulder.  2.  Degenerative joint disease acromioclavicular joint right shoulder.  3.  Biceps dislocation with biceps tear.   POSTOPERATIVE DIAGNOSES:  1.  Impingement with partial rotator cuff tear.  2.  Degenerative joint disease, acromioclavicular joint.  3.  Dislocation and tearing of biceps tendon.   PROCEDURE:  1.  Diagnostic arthroscopy right shoulder.  2.  Debridement of synovitis and partial rotator cuff tear.  3.  Arthroscopic subacromial decompression.  4.  Arthroscopic distal clavicle resection.  5.  Open biceps tenodesis.   SURGEON:  Claude Manges. Cleophas Dunker, M.D.   ASSISTANT:  Richardean Canal, P.A.-C   ANESTHESIA:  General orotracheal with supplemental interscalene nerve block.   COMPLICATIONS:  None.   HISTORY:  This 77 year old gentleman has been having trouble with his right  shoulder for approximately 6 months.  He did have an injury in 2006 when he  fell in his basement at home.  Since that time he has had some difficulty  raising his arm above his head.  He has had a fair amount of weakness with  suspicion of a rotator cuff tear. He had an MRI scan performed on  04/21/2006.  This revealed hypertrophic AC joint arthropathy and  anterodistal posterior rotator cuff full thickness tear without evidence of  rupture or traction.  There was a large joint effusion probably  subscapularis tear with displacement of the biceps tendon long head medially  into the joint space.  There was no evidence of an acute labral tear.  He is  noted to have an  arthroscopic evaluation.   DESCRIPTION OF PROCEDURE:  With the patient comfortable on the operating  table and under general orotracheal anesthesia the patient was placed in a  semisitting position with the shoulder frame.  The right shoulder was then  prepped with DuraPrep from the base of the neck circumferentially to below  the elbow.  Sterile draping was performed.  The patient had a preoperative  interscalene nerve block.   A marking pen was used to outline the [facility] joint, the coracoid and the  acromion at a pointed fingerbreadth posterior and medial to the posterior  angle of the acromion where a small stab wound was made.  The arthroscope  was then easily placed into the shoulder joint.  There was no effusion.   Diagnostic arthroscopy revealed some fraying of the anterior __________  labrum.  There was abundant synovitis.  The biceps tendon was obviously torn  about 1.5 cm from its attachment of the superior glenoid and there was  partial rotator cuff tear.  A second portal was established anteriorly and  synovectomy was performed, followed by debridement  of the partial rotator  cuff tear.  The arthroscope was then placed in the subacromial space  posteriorly, the cannula in the subacromial space anteriorly and a third  portal established in a lateral subacromial space.  An arthroscopic  subacromial decompression was performed debrided abundant bursal tissue and  synovitis.  It was obvious anterior overhang of the acromion and this was  debrided with a 6-mm burr with an excellent decompression.  There was  considerable overhang of both the acromion and the clavicle with  degenerative changes at the [facility] joint.  A distal clavicle resection was  performed with the 6-mm bur.  With evidence of rotator cuff tear and  significant biceps pathology, an open procedure was then performed.   At the anterior aspect of the shoulder, about 1-1/2-inch incision was made  with a 15-blade knife  beginning at the level of the acromion.  By sharp  dissection the incision was carried down to the subcutaneous tissue and  small bleeders were bovied coagulated.  Deltoid fascia was incised the  length of the skin incision.  By a blunt dissection the fibers were  separated and the subacromial space entered.  A self-retaining retractor was  inserted.  The rotator cuff was carefully evaluated without evidence of an  obvious tear.  There were some bursal surface changes from the previous  impingement.  I thought that he had a very nice decompression and by finger  palpation a nice resection of the distal clavicle.  The biceps groove was  identified.  It was incised with a 15-blade knife.  The biceps tendon was  not in the groove, but subluxed medially.  It was retrieved with  considerable i.e. over 50% tearing and accordingly a biceps tenodesis was  performed.  The tendon was tagged with a Vicryl suture and then its origin  incised from the superior glenoid. A bleeding bone was established in the  biceps groove.  A single Mitek anchor inserted, and then sutured to the  groove. I supplemented the tenodesis with a #1 Ethibond suture through the  distal cuff and biceps groove ligament and then through the tendon.   The wound was then irrigated and the tear in the rotator cuff was then  repaired.  I did not see an obvious subscapularis tear in the subscapula  looked relatively intact through the arthroscope.   The wound was then irrigated with saline solution.  The deltoid fascia was  closed with a running #0 Vicryl.  The subcu with 2-0 Vicryl, and the skin  closed with Steri-Strips over benzoin.  A sterile bulky dressing was applied  followed by a sling.  Plan for recovery care.  Percocet for pain.  The  patient has been recently diagnosed with atrial fibrillation and will start  his Coumadin.      Claude Manges. Cleophas Dunker, M.D.  Electronically Signed     PWW/MEDQ  D:  05/18/2006  T:   05/18/2006  Job:  [id number]

## 2011-04-01 NOTE — Letter (Signed)
January 25, 2007    Scott A. Gerda Diss, M.D.  [address]  [place], [place] [address]   RE:  [name], [name]  MRN:  [id number]  /  DOB:  [birth year]   Dear [name]:   [name] [name] returns to the office for a continuing evaluation of exercise  intolerance.  His TSH and stress test were quite good.  The only issue  was a rapid increase in heart rate at low level exercise in atrial  fibrillation.  He reports that his symptoms are unchanged.  He seems a  little vague with respect to his medicine, some of which is in bubble  packs and some of which is in pill containers.  He has done fairly well  with his Coumadin, in terms of having a stable therapeutic INR.   PHYSICAL EXAMINATION:  GENERAL:  A pleasant gentleman, in no acute  distress.  VITAL SIGNS:  Weight is 249 pounds, 2 pounds less than two weeks ago.  Blood pressure 135/60, heart rate 70 and irregular, respirations 16.  NECK:  No jugular venous distention.  LUNGS:  Clear.  HEART:  An irregular rhythm, normal first and second heart sounds,  modest systolic murmur.  ABDOMEN:  Soft, nontender.  No organomegaly.  EXTREMITIES:  A trace of edema.   IMPRESSION:  [name] [name] is doing generally well.  I cannot totally  understand exactly what he is driving at with respect to his  medications, but he appears to want a trial off of pravastatin.  We will  do this over the next month.  I will also add Toprol 25 mg q.d. to his  regimen, in an attempt  to control his heart rate and determine whether this improves exercise  capacity.  I will see him again in one month.  We discussed the  importance of continuing anticoagulation.  He questioned me about the  possibility of a procedure to occlude the left atrial appendage.  I told  him that this was not likely in his future.    Sincerely,      Gerrit Friends. Dietrich Pates, MD, [facility]  Electronically Signed    RMR/MedQ  DD: 01/25/2007  DT: 01/26/2007  Job #: [id number]

## 2011-07-01 ENCOUNTER — Other Ambulatory Visit: Payer: Self-pay | Admitting: Family Medicine

## 2011-07-01 ENCOUNTER — Ambulatory Visit (HOSPITAL_COMMUNITY)
Admission: RE | Admit: 2011-07-01 | Discharge: 2011-07-01 | Disposition: A | Discharge status: Home / Self Care | Payer: Medicare Other | Source: Ambulatory Visit | Attending: Family Medicine | Admitting: Family Medicine

## 2011-07-01 DIAGNOSIS — R0989 Other specified symptoms and signs involving the circulatory and respiratory systems: Secondary | ICD-10-CM | POA: Insufficient documentation

## 2011-07-01 DIAGNOSIS — R0609 Other forms of dyspnea: Secondary | ICD-10-CM

## 2011-07-06 ENCOUNTER — Other Ambulatory Visit: Payer: Self-pay | Admitting: Family Medicine

## 2011-07-06 DIAGNOSIS — R6889 Other general symptoms and signs: Secondary | ICD-10-CM

## 2011-07-06 DIAGNOSIS — R42 Dizziness and giddiness: Secondary | ICD-10-CM

## 2011-07-11 ENCOUNTER — Ambulatory Visit (HOSPITAL_COMMUNITY): Payer: Medicare Other

## 2011-07-12 ENCOUNTER — Ambulatory Visit (HOSPITAL_COMMUNITY)
Admission: RE | Admit: 2011-07-12 | Discharge: 2011-07-12 | Disposition: A | Discharge status: Home / Self Care | Payer: Medicare Other | Source: Ambulatory Visit | Attending: Family Medicine | Admitting: Family Medicine

## 2011-07-12 ENCOUNTER — Encounter (HOSPITAL_COMMUNITY): Payer: Self-pay

## 2011-07-12 DIAGNOSIS — R6889 Other general symptoms and signs: Secondary | ICD-10-CM

## 2011-07-12 DIAGNOSIS — E119 Type 2 diabetes mellitus without complications: Secondary | ICD-10-CM | POA: Insufficient documentation

## 2011-07-12 DIAGNOSIS — I10 Essential (primary) hypertension: Secondary | ICD-10-CM | POA: Insufficient documentation

## 2011-07-12 DIAGNOSIS — R42 Dizziness and giddiness: Secondary | ICD-10-CM | POA: Insufficient documentation

## 2011-07-12 DIAGNOSIS — R413 Other amnesia: Secondary | ICD-10-CM | POA: Insufficient documentation

## 2011-07-12 DIAGNOSIS — G319 Degenerative disease of nervous system, unspecified: Secondary | ICD-10-CM | POA: Insufficient documentation

## 2011-07-12 HISTORY — DX: Essential (primary) hypertension: I10

## 2011-07-14 ENCOUNTER — Ambulatory Visit (INDEPENDENT_AMBULATORY_CARE_PROVIDER_SITE_OTHER): Payer: Medicare Other | Admitting: Cardiology

## 2011-07-14 ENCOUNTER — Encounter: Payer: Self-pay | Admitting: Cardiology

## 2011-07-14 DIAGNOSIS — Z7901 Long term (current) use of anticoagulants: Secondary | ICD-10-CM

## 2011-07-14 DIAGNOSIS — I359 Nonrheumatic aortic valve disorder, unspecified: Secondary | ICD-10-CM

## 2011-07-14 DIAGNOSIS — I679 Cerebrovascular disease, unspecified: Secondary | ICD-10-CM

## 2011-07-14 DIAGNOSIS — E119 Type 2 diabetes mellitus without complications: Secondary | ICD-10-CM | POA: Insufficient documentation

## 2011-07-14 DIAGNOSIS — I4891 Unspecified atrial fibrillation: Secondary | ICD-10-CM | POA: Insufficient documentation

## 2011-07-14 DIAGNOSIS — E785 Hyperlipidemia, unspecified: Secondary | ICD-10-CM

## 2011-07-14 DIAGNOSIS — Z91199 Patient's noncompliance with other medical treatment and regimen due to unspecified reason: Secondary | ICD-10-CM

## 2011-07-14 DIAGNOSIS — Z9119 Patient's noncompliance with other medical treatment and regimen: Secondary | ICD-10-CM

## 2011-07-14 DIAGNOSIS — R0602 Shortness of breath: Secondary | ICD-10-CM

## 2011-07-14 DIAGNOSIS — I350 Nonrheumatic aortic (valve) stenosis: Secondary | ICD-10-CM

## 2011-07-14 DIAGNOSIS — I10 Essential (primary) hypertension: Secondary | ICD-10-CM

## 2011-07-14 NOTE — Assessment & Plan Note (Signed)
Arrhythmia may have progressed and may no longer be paroxysmal.  Nonetheless, AF has been present for many years, at least intermittently, and has not caused significant symptoms.  While it is possible that low level exercise produces a nonacceptable increase in heart rate, this is unlikely.  We will proceed with a stress echocardiogram for further evaluation.

## 2011-07-14 NOTE — Patient Instructions (Signed)
A chest x-ray takes a picture of the organs and structures inside the chest, including the heart, lungs, and blood vessels. This test can show several things, including, whether the heart is enlarges; whether fluid is building up in the lungs; and whether pacemaker / defibrillator leads are still in place.  Your physician has requested that you have a stress echocardiogram. For further information please visit [email or web address] Please follow instruction sheet as given.  Your physician has recommended that you have a pulmonary function test. Pulmonary Function Tests are a group of tests that measure how well air moves in and out of your lungs.  Your physician recommends that you return for lab work in: today  Your physician recommends that you schedule a follow-up appointment in: after tests

## 2011-07-14 NOTE — Assessment & Plan Note (Signed)
Blood pressure control is excellent with current medications, which will be continued. 

## 2011-07-14 NOTE — Assessment & Plan Note (Signed)
Non-statin agents can be utilized in an attempt to better control hyperlipidemia.  Although patient has no known vascular disease, he does have diabetes.  Ezetimibe will be started dose of 10 mg q.d and WelChol at 1250 mg b.i.d with meals.  Lipid profile will be reassessed in one month.

## 2011-07-14 NOTE — Assessment & Plan Note (Signed)
Examination suggests that there has not been much progression in the severity of aortic stenosis, which previously was minimal.  Valve will be reassessed in conjunction with patient's stress echocardiogram.

## 2011-07-14 NOTE — Progress Notes (Signed)
HPI:  Mr. Quintanar returns after a hiatus of 3 years, referred by Dr. Lilyan Punt for evaluation of progressive exertional dyspnea and fatigue.  This nice gentleman has sustained significant psychologic stress, caring for his wife who has significant medical problems for the past few years.  During the past 12 months predominantly, he is noted progressive malaise, exercise intolerance, dyspnea associated with exertion and marked fatigue with minimal effort.  He denies chest discomfort or palpitations.  He has had episodic lightheadedness with exertion but no syncope.  Diabetic control has been good.  He has had no significant hypertension.  Ambulation is difficult as a result of pain in his right ankle related to a severe traumatic injury in the past.    Prior records obtained from Dr. Lilyan Punt and reviewed.  Prior EKG reviewed.  Current Outpatient Prescriptions  Medication Sig Dispense Refill  . fluticasone (FLONASE) 50 MCG/ACT nasal spray       . glyBURIDE micronized (GLYNASE) 3 MG tablet Take 3 mg by mouth daily with breakfast.       . HYDROcodone-acetaminophen (VICODIN) 5-500 MG per tablet 1 tablet as needed.       . metoprolol succinate (TOPROL-XL) 25 MG 24 hr tablet Take 25 mg by mouth daily.       [name] omeprazole (PRILOSEC) 20 MG capsule Take 20 mg by mouth daily.        [name] warfarin (COUMADIN) 5 MG tablet Take 5 mg by mouth daily. As directed.      . zolpidem (AMBIEN) 10 MG tablet Take 10 mg by mouth at bedtime as needed.          Allergies  Allergen Reactions  . Seasonal       Past Medical History  Diagnosis Date  . Diabetes mellitus   . Hypertension   . Cerebrovascular disease 07/2010    TIA; carotid ultrasound in 07/2010-significant bilateral plaque without focal internal carotid artery stenosis; MRI -encephalomalacia left temporal and right temporal lobes; small inferior right cerebellar infarct; small vessel disease  . Atrial fibrillation     Paroxysmal; Echocardiogram in  2007-normal EF; mild LVH; left atrial enlargement; mild stenosis and minimal AI; negative stress nuclear study in 2008  . Hyperlipidemia     adverse reactions to statins and niacin  . History of noncompliance with medical treatment   . Aortic stenosis 2007    Very mild     Past Surgical History  Procedure Date  . Rotator cuff repair     Right     Family History  Problem Relation Age of Onset  . Stroke Mother   . Diabetes Father      History   Social History  . Marital Status: Married    Spouse Name: N/A    Number of Children: 1  . Years of Education: N/A   Occupational History  . Retired    Social History Main Topics  . Smoking status: Former Smoker    Quit date: 04/28/2001  . Smokeless tobacco: Former Neurosurgeon    Quit date: 04/28/1997  . Alcohol Use: No  . Drug Use: No  . Sexually Active: Not Currently   Other Topics Concern  . Not on file   Social History Narrative  . No narrative on file     ROS: Denies abdominal discomfort, change in bowel habit, nausea, emesis, history of lung disease or history of anemia.  He was employed at a [employer] and was exposed to powdered chemicals, but not to  sulfuric acid or other fumes.   All other systems reviewed and are negative.  PHYSICAL EXAM: BP 102/75  Pulse 74  Resp 18  Ht 6\' 1"  (1.854 m)  Wt 117.028 kg (258 lb)  BMI 34.04 kg/m2  SpO2 97%  General-Well-developed; no acute distress Body Habitus-proportionate weight and height HEENT-[place]/AT; PERRL; EOM intact; conjunctiva and lids nl Neck-No JVD; no carotid bruits Endocrine-No thyromegaly Lungs-Clear lung fields; resonant percussion; normal I-to-E ratio Cardiovascular- normal PMI; normal S1 and S2; grade 1-2/6 systolic ejection murmur Abdomen-BS normal; soft and non-tender without masses or organomegaly Musculoskeletal-No deformities, cyanosis or clubbing Neurologic-Nl cranial nerves; symmetric strength and tone Skin- Warm, no significant  lesions Extremities-Nl distal pulses; 1/2+ edema  EKG performed 07/01/11: Atrial fibrillation with a controlled ventricular response; borderline low voltage in the limb leads; otherwise normal.  Comparison with a prior tracing performed 09/16/04, atrial fibrillation has replaced sinus rhythm; limb lead voltage has decreased.  CXR performed 07/01/11: nodular apical scarring; otherwise clear lung fields; otherwise normal.  No change compared with a previous tracing of 05/08/2006.  ASSESSMENT AND PLAN:

## 2011-07-15 ENCOUNTER — Ambulatory Visit (INDEPENDENT_AMBULATORY_CARE_PROVIDER_SITE_OTHER): Payer: Medicare Other | Admitting: Urology

## 2011-07-15 DIAGNOSIS — R351 Nocturia: Secondary | ICD-10-CM

## 2011-07-15 DIAGNOSIS — R350 Frequency of micturition: Secondary | ICD-10-CM

## 2011-07-15 DIAGNOSIS — N3941 Urge incontinence: Secondary | ICD-10-CM

## 2011-07-15 DIAGNOSIS — N138 Other obstructive and reflux uropathy: Secondary | ICD-10-CM

## 2011-07-15 DIAGNOSIS — R3129 Other microscopic hematuria: Secondary | ICD-10-CM

## 2011-07-15 DIAGNOSIS — N401 Enlarged prostate with lower urinary tract symptoms: Secondary | ICD-10-CM

## 2011-07-15 LAB — CBC WITH DIFFERENTIAL/PLATELET
Eosinophils Relative: 2 % (ref 0–5)
HCT: 47.3 % (ref 39.0–52.0)
Hemoglobin: 16.4 g/dL (ref 13.0–17.0)
Lymphocytes Relative: 19 % (ref 12–46)
Lymphs Abs: 1.1 10*3/uL (ref 0.7–4.0)
MCH: 31.7 pg (ref 26.0–34.0)
MCV: 91.3 fL (ref 78.0–100.0)
Monocytes Relative: 6 % (ref 3–12)
Platelets: 233 10*3/uL (ref 150–400)
RBC: 5.18 MIL/uL (ref 4.22–5.81)
WBC: 5.6 10*3/uL (ref 4.0–10.5)

## 2011-07-22 ENCOUNTER — Encounter: Payer: Self-pay | Admitting: Cardiology

## 2011-07-25 ENCOUNTER — Ambulatory Visit (HOSPITAL_COMMUNITY)
Admission: RE | Admit: 2011-07-25 | Discharge: 2011-07-25 | Disposition: A | Discharge status: Home / Self Care | Payer: Medicare Other | Source: Ambulatory Visit | Attending: Cardiology | Admitting: Cardiology

## 2011-07-25 DIAGNOSIS — R0989 Other specified symptoms and signs involving the circulatory and respiratory systems: Secondary | ICD-10-CM | POA: Insufficient documentation

## 2011-07-25 DIAGNOSIS — R0609 Other forms of dyspnea: Secondary | ICD-10-CM | POA: Insufficient documentation

## 2011-07-25 LAB — BLOOD GAS, ARTERIAL
TCO2: 19.1 mmol/L (ref 0–100)
pCO2 arterial: 31.6 mmHg — ABNORMAL LOW (ref 35.0–45.0)
pH, Arterial: 7.477 — ABNORMAL HIGH (ref 7.350–7.450)
pO2, Arterial: 82.7 mmHg (ref 80.0–100.0)

## 2011-07-25 LAB — PULMONARY FUNCTION TEST

## 2011-07-25 MED ORDER — LEVALBUTEROL HCL 0.63 MG/3ML IN NEBU
0.6300 mg | INHALATION_SOLUTION | Freq: Once | RESPIRATORY_TRACT | Status: AC
  Administered 2011-07-25: 0.63 mg via RESPIRATORY_TRACT
  Filled 2011-07-25: qty 3

## 2011-07-26 ENCOUNTER — Ambulatory Visit (HOSPITAL_COMMUNITY)
Admission: RE | Admit: 2011-07-26 | Discharge: 2011-07-26 | Disposition: A | Discharge status: Home / Self Care | Payer: Medicare Other | Source: Ambulatory Visit | Attending: Cardiology | Admitting: Cardiology

## 2011-07-26 ENCOUNTER — Encounter (HOSPITAL_COMMUNITY): Payer: Self-pay | Admitting: Cardiology

## 2011-07-26 DIAGNOSIS — R0602 Shortness of breath: Secondary | ICD-10-CM

## 2011-07-26 DIAGNOSIS — R5381 Other malaise: Secondary | ICD-10-CM | POA: Insufficient documentation

## 2011-07-26 DIAGNOSIS — R0989 Other specified symptoms and signs involving the circulatory and respiratory systems: Secondary | ICD-10-CM | POA: Insufficient documentation

## 2011-07-26 DIAGNOSIS — R0609 Other forms of dyspnea: Secondary | ICD-10-CM

## 2011-07-26 NOTE — Progress Notes (Signed)
*  PRELIMINARY RESULTS* Echocardiogram Echocardiogram Stress Test has been performed.  [name] [name] 07/26/2011, 10:29 AM3

## 2011-07-26 NOTE — Progress Notes (Signed)
Stress Lab Nurses Notes - [name] 07/26/2011  Reason for doing test: Dyspnea and fatigue  Type of test: Stress Echo  Nurse performing test: Parke Poisson, RN  Nuclear Medicine Tech: Not Applicable  Echo Tech: Karrie Doffing  MD performing test: R. Rothbart  Family MD: Lilyan Punt  Test explained and consent signed: yes  IV started: No IV started  Symptoms: O2 sat 93% @ rest, during test O2 sat 95%.  SOB and fatigue  Treatment/Intervention: None  Reason test stopped: SOB  After recovery IV was: NA  Patient to return to Nuc. Med at : NA  Patient discharged: Home  Patient's Condition upon discharge was: stable  Comments: Peak BP 158/68, HR 122 & recovery BP 122/62 , HR 75. Symptoms resolved in recovery.  [name]

## 2011-07-29 NOTE — Procedures (Signed)
[name] [name], [name] [name]                ACCOUNT NO.:  [id number]  MEDICAL RECORD NO.:  [id number]  LOCATION:  [facility]H                          FACILITY:  [facility]  PHYSICIAN:  Cid Agena L. Juanetta Gosling, M.D.DATE OF BIRTH:  May 12, 1932  DATE OF PROCEDURE: DATE OF DISCHARGE:  07/26/2011                           PULMONARY FUNCTION TEST   REASON FOR PULMONARY FUNCTION TESTING:  Shortness of breath.  1. Spirometry shows only minimal airflow obstruction and no     ventilatory defect. 2. Lung volumes are normal. 3. DLCO is normal. 4. Arterial blood gas is normal. 5. There is no significant bronchodilator improvement. 6. Pulmonary function testing does not demonstrate a cause of     shortness of breath.     Stephens Shreve L. Juanetta Gosling, M.D.     ELH/MEDQ  D:  07/29/2011  T:  07/29/2011  Job:  [id number]

## 2011-08-01 ENCOUNTER — Encounter: Payer: Self-pay | Admitting: Adult Health

## 2011-08-01 ENCOUNTER — Ambulatory Visit (INDEPENDENT_AMBULATORY_CARE_PROVIDER_SITE_OTHER): Payer: Medicare Other | Admitting: Adult Health

## 2011-08-01 DIAGNOSIS — R0609 Other forms of dyspnea: Secondary | ICD-10-CM

## 2011-08-01 DIAGNOSIS — I350 Nonrheumatic aortic (valve) stenosis: Secondary | ICD-10-CM

## 2011-08-01 DIAGNOSIS — I359 Nonrheumatic aortic valve disorder, unspecified: Secondary | ICD-10-CM

## 2011-08-01 NOTE — Assessment & Plan Note (Signed)
Stress echo and diagnostic echo demonstrated normal fx and EF.  There was only mild calcification on AoV.  He had severely decreased exercise tolerance with shortness of breath.  LV global systolic function was normal. EF of 55%. No echocardiogenic evidence of stress-induced ischemia.  He is given reassurance concerning test results and is encouraged to begin an exercise program at the [facility].

## 2011-08-01 NOTE — Progress Notes (Signed)
HPI: [name] is a friendly 77 y/o patient of Dr. Dietrich Pates we are following after multiple tests for complaints of DOE and fatigue. He has a history of hypertension, AoV stenosis, Atrial fibrillation, on coumadin followed by Dr. Gerda Diss, hyperlipidemia, and cerebral vascular disease.  On last visit stress echo, PFT;s, TSH, and other labs were ordered. He is here for follow-up.  He denies any new symptoms, but continues to be short of breath with exertion.  Allergies  Allergen Reactions  . Lipitor (Atorvastatin Calcium) Other (See Comments)    myalgias  . Ranitidine Other (See Comments)    Chest discomfort  . Seasonal   . Simvastatin Other (See Comments)    Myalgias    Current Outpatient Prescriptions  Medication Sig Dispense Refill  . fish oil-omega-3 fatty acids 1000 MG capsule Take 2 g by mouth daily.        [name] glyBURIDE micronized (GLYNASE) 3 MG tablet Take 3 mg by mouth daily with breakfast.       . HYDROcodone-acetaminophen (VICODIN) 5-500 MG per tablet 1 tablet as needed.       . metoprolol succinate (TOPROL-XL) 25 MG 24 hr tablet Take 25 mg by mouth daily.       [name] warfarin (COUMADIN) 5 MG tablet Take 5 mg by mouth daily. As directed.      . zolpidem (AMBIEN) 10 MG tablet Take 10 mg by mouth at bedtime as needed.       . fluticasone (FLONASE) 50 MCG/ACT nasal spray       . omeprazole (PRILOSEC) 20 MG capsule Take 20 mg by mouth daily.          Past Medical History  Diagnosis Date  . Diabetes mellitus     no insulin; A1c of 6.6 in 2005  . Hypertension     Borderline  . Cerebrovascular disease 07/2010    TIA; carotid ultrasound in 07/2010-significant bilateral plaque without focal internal carotid artery stenosis; MRI -encephalomalacia left temporal and right temporal lobes; small inferior right cerebellar infarct; small vessel disease  . Atrial fibrillation     Paroxysmal; Echocardiogram in 2007-normal EF; mild LVH; left atrial enlargement; mild stenosis and minimal AI; negative  stress nuclear study in 2008  . Hyperlipidemia     adverse reactions to statins and niacin  . History of noncompliance with medical treatment   . Aortic stenosis 2007    Very mild  . Hepatic steatosis     Past Surgical History  Procedure Date  . Rotator cuff repair     Right  . Lipoma excision 1980  . Urethral stricture dilatation 1980s  . Orif ankle fracture 2000  . Colonoscopy 2002    ZOX:WRUEAV of systems complete and found to be negative unless listed above PHYSICAL EXAM BP 150/75  Pulse 67  Resp 18  Ht 6\' 1"  (1.854 m)  Wt 257 lb 12.8 oz (116.937 kg)  BMI 34.01 kg/m2  SpO2 97% General: Well developed, well nourished, in no acute distress Head: Eyes PERRLA, No xanthomas.   Normal cephalic and atramatic  Lungs: Clear bilaterally to auscultation and percussion. Heart: HRRR S1 S2, without MRG.  Pulses are 2+ & equal.            No carotid bruit. No JVD.  No abdominal bruits. No femoral bruits. Neuro: Alert and oriented X 3. Psych:  Good affect, responds appropriately   ASSESSMENT AND PLAN

## 2011-08-01 NOTE — Assessment & Plan Note (Signed)
PFT's were found to be normal with normal lung volumes, DLCO, ABG and spirometry.  He believes the shortness of breath is related to inactivity. He states he lays on the couch most of the day watching television and has stopped playing golf and walking.  I have advised him to go to [facility] and use stationary bike to avoid pressure on a sore heal.  He will also benefit from social interaction with others there as he appears to be in need of this.  He verbalizes understanding and is going to go.

## 2011-08-01 NOTE — Patient Instructions (Signed)
Your physician wants you to follow-up in: 12 months with Dr Debbe Mounts will receive a reminder letter in the mail two months in advance. If you don't receive a letter, please call our office to schedule the follow-up appointment.

## 2011-08-11 LAB — POCT CARDIAC MARKERS
Myoglobin, poc: 70.5
Operator id: 208401

## 2011-08-11 LAB — COMPREHENSIVE METABOLIC PANEL
AST: 25
BUN: 14
CO2: 24
Calcium: 9.4
Chloride: 105
Creatinine, Ser: 1.09
GFR calc Af Amer: >60
GFR calc non Af Amer: >60
Total Bilirubin: 0.8

## 2011-08-11 LAB — DIFFERENTIAL
Basophils Absolute: 0.1
Lymphocytes Relative: 18
Lymphs Abs: 0.9
Neutro Abs: 3.5
Neutrophils Relative %: 75

## 2011-08-11 LAB — CBC
HCT: 42.5
MCHC: 35.9
MCV: 91.2
RBC: 4.66
WBC: 4.7

## 2011-08-12 ENCOUNTER — Ambulatory Visit (INDEPENDENT_AMBULATORY_CARE_PROVIDER_SITE_OTHER): Payer: Medicare Other | Admitting: Urology

## 2011-08-12 DIAGNOSIS — N401 Enlarged prostate with lower urinary tract symptoms: Secondary | ICD-10-CM

## 2011-08-12 DIAGNOSIS — N3941 Urge incontinence: Secondary | ICD-10-CM

## 2011-08-29 ENCOUNTER — Telehealth: Payer: Self-pay | Admitting: Cardiology

## 2011-08-29 NOTE — Telephone Encounter (Signed)
Determined who manages anticoagulation and obtain the most recent INR and date performed.

## 2011-08-29 NOTE — Telephone Encounter (Signed)
[name] [name] is requesting cardiac clearance for TURP.  In addition would like to stop coumadin 5 days before and 1 week after procedure.

## 2011-09-01 NOTE — Telephone Encounter (Signed)
Dr Lilyan Punt manages coumadin at his office and has addressed coumadin recommendations with Dr Annabell Howells.

## 2011-09-01 NOTE — Telephone Encounter (Signed)
Noted  

## 2011-09-02 ENCOUNTER — Encounter (HOSPITAL_COMMUNITY): Payer: Self-pay | Admitting: Cardiology

## 2011-09-05 ENCOUNTER — Telehealth: Payer: Self-pay | Admitting: *Deleted

## 2011-09-05 NOTE — Telephone Encounter (Signed)
Patient notified of normal PFT results.

## 2011-09-05 NOTE — Telephone Encounter (Signed)
Message copied by Gaynelle Adu on Mon Sep 05, 2011  9:08 AM ------      Message from: Kathlen Brunswick      Created: Sun Sep 04, 2011  1:56 PM       Diagnostic testing reviewed; Normal or stable results.      No change in medical therapy.

## 2011-09-15 ENCOUNTER — Telehealth: Payer: Self-pay | Admitting: Cardiology

## 2011-09-15 HISTORY — PX: TRANSURETHRAL RESECTION OF PROSTATE: SHX73

## 2011-09-15 NOTE — Telephone Encounter (Signed)
All Cardiac faxed to [facility] @ [phone]  09/15/11/km

## 2011-09-20 ENCOUNTER — Other Ambulatory Visit: Payer: Self-pay

## 2011-09-20 ENCOUNTER — Encounter (HOSPITAL_COMMUNITY): Payer: Self-pay

## 2011-09-20 ENCOUNTER — Encounter (HOSPITAL_COMMUNITY): Payer: Medicare Other

## 2011-09-20 LAB — CBC
Hemoglobin: 16.4 g/dL (ref 13.0–17.0)
MCH: 32.3 pg (ref 26.0–34.0)
MCHC: 36.2 g/dL — ABNORMAL HIGH (ref 30.0–36.0)
Platelets: 218 10*3/uL (ref 150–400)
RDW: 12.9 % (ref 11.5–15.5)

## 2011-09-20 LAB — BASIC METABOLIC PANEL
Calcium: 10 mg/dL (ref 8.4–10.5)
GFR calc Af Amer: >90 mL/min (ref >90)
GFR calc non Af Amer: 80 mL/min — ABNORMAL LOW (ref >90)
Glucose, Bld: 152 mg/dL — ABNORMAL HIGH (ref 70–99)
Potassium: 4.6 mEq/L (ref 3.5–5.1)
Sodium: 136 mEq/L (ref 135–145)

## 2011-09-20 LAB — SURGICAL PCR SCREEN: Staphylococcus aureus: NEGATIVE

## 2011-09-20 LAB — PROTIME-INR: INR: 2.2 — ABNORMAL HIGH (ref 0.00–1.49)

## 2011-09-20 NOTE — Patient Instructions (Signed)
20 [name] [name]  09/20/2011   Your procedure is scheduled on: 09/27/2011  Report to [facility] at 0700 AM.  Call this number if you have problems the morning of surgery: [phone]   Remember:   Do not eat food:After Midnight.  Do not drink clear liquids: After Midnight.  Take these medicines the morning of surgery with A SIP OF WATER: Metoprolol,Prilosec, may use Flonase nasal spray   Do not wear jewelry, make-up or nail polish.  Do not wear lotions, powders, or perfumes.   Do not shave 48 hours prior to surgery.  Do not bring valuables to the hospital.  Contacts, dentures or bridgework may not be worn into surgery.  Leave suitcase in the car. After surgery it may be brought to your room.  For patients admitted to the hospital, checkout time is 11:00 AM the day of discharge.   Patients discharged the day of surgery will not be allowed to drive home.  Name and phone number of your driver:   Special Instructions: CHG Shower Use Special Wash: 1/2 bottle night before surgery and 1/2 bottle morning of surgery.   Please read over the following fact sheets that you were given: MRSA Information

## 2011-09-20 NOTE — Pre-Procedure Instructions (Signed)
09/05/2011-clearance from Dr. Gerda Diss on chart, 08/02/2011-Stress Echocardiography on chart,07/01/2011-chest 2 view on chart

## 2011-09-26 NOTE — H&P (Signed)
1. [name] [name] 600.01 2. Microscopic Hematuria 599.72 3. Nocturia 788.43 4. Obstructive Uropathy 599.60 5. Urge Incontinence Of Urine 788.31  History of Present Illness  [name] [name] returns today for a TURP for his BPH with BOO with UUI and instability.   Past Medical History Problems  1. History of  Arthritis V13.4 2. History of  Atrial Fibrillation 427.31 3. History of  Depression 311 4. History of  Diabetes Mellitus 250.00 5. History of  Esophageal Reflux 530.81 6. History of  Hypercholesterolemia 272.0 7. History of  Hypertension 401.9 8. History of  Nephrolithiasis V13.01  Surgical History Problems  1. History of  Abdominal Surgery 2. History of  Ankle Surgery 3. History of  Foot Surgery  Current Meds 1. Fluticasone Propionate 50 MCG/ACT Nasal Suspension; Therapy: (Recorded:10Aug2011) to 2. GlyBURIDE Micronized 3 MG Oral Tablet; Therapy: 18Jan2012 to 3. Hydrocodone-Acetaminophen TABS; Therapy: (Recorded:10Aug2011) to 4. Metoprolol Succinate TB24; Therapy: (Recorded:10Aug2011) to 5. Omeprazole TBEC; Therapy: (Recorded:10Aug2011) to 6. TraZODone HCl 100 MG Oral Tablet; Therapy: (Recorded:10Aug2011) to 7. Warfarin Sodium TABS; Therapy: (Recorded:10Aug2011) to 8. Zolpidem Tartrate TABS; Therapy: (Recorded:10Aug2011) to  Allergies Medication  1. Statins  Family History Problems  1. Sororal history of  Breast Cancer V16.3 2. Family history of  Death In The Family Father 56's 3. Family history of  Death In The Family Mother 41's 4. Paternal history of  Diabetes Mellitus V18.0 5. Family history of  Family Health Status Number Of Children 1 son 6. Family history of  Heart Disease V17.49 7. Maternal history of  Stroke Syndrome V17.1  Social History Problems  1. Caffeine Use 1; cola 2. Marital History - Currently Married 3. Retired From Work 4. Tobacco Use V15.82 Smoked 1/2 ppd for 15-20 yrs but quit about 20 years  ago. Denied  5. History of  Alcohol Use  Physical Exam Constitutional: Well nourished and well developed . No acute distress.  Pulmonary: No respiratory distress and normal respiratory rhythm and effort.  Cardiovascular: Heart rate and rhythm are normal . No peripheral edema.    Results/Data Urine [Data Includes: Last 1 Day]  11Oct2012  COLOR: YELLOW  Reference Range YELLOW APPEARANCE: CLEAR  Reference Range CLEAR SPECIFIC GRAVITY: 1.025  Reference Range 1.005-1.030 pH: 5.5  Reference Range 5.0-8.0 GLUCOSE: NEG mg/dL Reference Range NEG BILIRUBIN: NEG  Reference Range NEG KETONE: NEG mg/dL Reference Range NEG BLOOD: LARGE  Abnormal Reference Range NEG PROTEIN: NEG mg/dL Reference Range NEG UROBILINOGEN: 0.2 mg/dL Reference Range 8.2-9.5 NITRITE: NEG  Reference Range NEG LEUKOCYTE ESTERASE: NEG  Reference Range NEG SQUAMOUS EPITHELIAL/HPF: NONE SEEN  Reference Range RARE WBC: 0-3 WBC/hpf Reference Range <4 RBC: 0-3 RBC/hpf Reference Range <4 BACTERIA: RARE  Reference Range RARE CRYSTALS: NONE SEEN  Reference Range NEG CASTS: NONE SEEN  Reference Range NEG  The following images/tracing/specimen were independently visualized:  His prostate [place] today shows a volume of 44cc. See study sheet for all measurements. He has a normal prostate apart from calcifications on the left between the TZ and PZ.    Assessment Assessed  1. [name] [name] 600.01 2. Urge Incontinence Of Urine 788.31 3. Nocturia 788.43   He has BPH with BOO and instability and after reviewing the options I believe his best option is a TURP.   Plan [name] [name] (600.01)  1. PROSTATE U/S  Done: 11Oct2012 12:00AM 2. Follow-up Schedule Surgery Office  Follow-up  Requested for: 11Oct2012     He  is aware of the risks of the TURP including but not limited to bleeding, infection, incontinence, stricturing, fluid overload, thrombotic events  and anesthetic complications. He was advised of the expectations of the procedure.  Surgical clearance was obtained for him to be off of his anticoagulation.

## 2011-09-27 ENCOUNTER — Encounter (HOSPITAL_COMMUNITY): Payer: Self-pay | Admitting: *Deleted

## 2011-09-27 ENCOUNTER — Other Ambulatory Visit: Payer: Self-pay | Admitting: Urology

## 2011-09-27 ENCOUNTER — Encounter (HOSPITAL_COMMUNITY): Payer: Self-pay | Admitting: Anesthesiology

## 2011-09-27 ENCOUNTER — Ambulatory Visit (HOSPITAL_COMMUNITY): Payer: Medicare Other | Admitting: Anesthesiology

## 2011-09-27 ENCOUNTER — Encounter (HOSPITAL_COMMUNITY)
Admission: RE | Disposition: A | Discharge status: Home / Self Care | Payer: Self-pay | Source: Ambulatory Visit | Attending: Urology

## 2011-09-27 ENCOUNTER — Observation Stay (HOSPITAL_COMMUNITY)
Admission: RE | Admit: 2011-09-27 | Discharge: 2011-09-28 | Disposition: A | Discharge status: Home / Self Care | Payer: Medicare Other | Source: Ambulatory Visit | Attending: Urology | Admitting: Urology

## 2011-09-27 DIAGNOSIS — N3941 Urge incontinence: Secondary | ICD-10-CM | POA: Insufficient documentation

## 2011-09-27 DIAGNOSIS — E119 Type 2 diabetes mellitus without complications: Secondary | ICD-10-CM | POA: Insufficient documentation

## 2011-09-27 DIAGNOSIS — R3129 Other microscopic hematuria: Secondary | ICD-10-CM | POA: Insufficient documentation

## 2011-09-27 DIAGNOSIS — E7800 Pure hypercholesterolemia, unspecified: Secondary | ICD-10-CM | POA: Insufficient documentation

## 2011-09-27 DIAGNOSIS — I4891 Unspecified atrial fibrillation: Secondary | ICD-10-CM | POA: Insufficient documentation

## 2011-09-27 DIAGNOSIS — Z01812 Encounter for preprocedural laboratory examination: Secondary | ICD-10-CM | POA: Insufficient documentation

## 2011-09-27 DIAGNOSIS — Z7901 Long term (current) use of anticoagulants: Secondary | ICD-10-CM | POA: Insufficient documentation

## 2011-09-27 DIAGNOSIS — Z01810 Encounter for preprocedural cardiovascular examination: Secondary | ICD-10-CM | POA: Insufficient documentation

## 2011-09-27 DIAGNOSIS — N401 Enlarged prostate with lower urinary tract symptoms: Secondary | ICD-10-CM | POA: Diagnosis present

## 2011-09-27 DIAGNOSIS — I10 Essential (primary) hypertension: Secondary | ICD-10-CM | POA: Insufficient documentation

## 2011-09-27 DIAGNOSIS — Z79899 Other long term (current) drug therapy: Secondary | ICD-10-CM | POA: Insufficient documentation

## 2011-09-27 DIAGNOSIS — R351 Nocturia: Secondary | ICD-10-CM | POA: Insufficient documentation

## 2011-09-27 DIAGNOSIS — F3289 Other specified depressive episodes: Secondary | ICD-10-CM | POA: Insufficient documentation

## 2011-09-27 DIAGNOSIS — F329 Major depressive disorder, single episode, unspecified: Secondary | ICD-10-CM | POA: Insufficient documentation

## 2011-09-27 DIAGNOSIS — N138 Other obstructive and reflux uropathy: Secondary | ICD-10-CM

## 2011-09-27 DIAGNOSIS — K219 Gastro-esophageal reflux disease without esophagitis: Secondary | ICD-10-CM | POA: Insufficient documentation

## 2011-09-27 DIAGNOSIS — N320 Bladder-neck obstruction: Secondary | ICD-10-CM | POA: Insufficient documentation

## 2011-09-27 DIAGNOSIS — N139 Obstructive and reflux uropathy, unspecified: Secondary | ICD-10-CM | POA: Insufficient documentation

## 2011-09-27 HISTORY — DX: Benign prostatic hyperplasia with lower urinary tract symptoms: N13.8

## 2011-09-27 LAB — APTT: aPTT: 33 seconds (ref 24–37)

## 2011-09-27 LAB — GLUCOSE, CAPILLARY
Glucose-Capillary: 161 mg/dL — ABNORMAL HIGH (ref 70–99)
Glucose-Capillary: 158 mg/dL — ABNORMAL HIGH (ref 70–99)

## 2011-09-27 LAB — PROTIME-INR: INR: 1.1 (ref 0.00–1.49)

## 2011-09-27 SURGERY — TRANSURETHRAL PROSTATECTOMY WITH GYRUS INSTRUMENTS
Anesthesia: Choice | Site: Bladder | Wound class: Clean Contaminated

## 2011-09-27 MED ORDER — GLYCOPYRROLATE 0.2 MG/ML IJ SOLN
INTRAMUSCULAR | Status: DC | PRN
  Administered 2011-09-27: .2 mg via INTRAVENOUS

## 2011-09-27 MED ORDER — ONDANSETRON HCL 4 MG/2ML IJ SOLN
INTRAMUSCULAR | Status: DC | PRN
  Administered 2011-09-27: 4 mg via INTRAVENOUS

## 2011-09-27 MED ORDER — INSULIN ASPART 100 UNIT/ML ~~LOC~~ SOLN
0.0000 [IU] | SUBCUTANEOUS | Status: DC
  Administered 2011-09-27 (×2): 3 [IU] via SUBCUTANEOUS
  Administered 2011-09-28 (×2): 2 [IU] via SUBCUTANEOUS
  Filled 2011-09-27: qty 3

## 2011-09-27 MED ORDER — EPHEDRINE SULFATE 50 MG/ML IJ SOLN
INTRAMUSCULAR | Status: DC | PRN
  Administered 2011-09-27: 10 mg via INTRAVENOUS

## 2011-09-27 MED ORDER — ONDANSETRON HCL 4 MG/2ML IJ SOLN: 4.0000 mg | INTRAMUSCULAR | Status: DC | PRN

## 2011-09-27 MED ORDER — GLYBURIDE MICRONIZED 3 MG PO TABS
3.0000 mg | ORAL_TABLET | Freq: Every day | ORAL | Status: DC
  Administered 2011-09-28: 3 mg via ORAL
  Filled 2011-09-27: qty 1

## 2011-09-27 MED ORDER — LIDOCAINE HCL (CARDIAC) 20 MG/ML IV SOLN
INTRAVENOUS | Status: DC | PRN
  Administered 2011-09-27: 80 mg via INTRAVENOUS

## 2011-09-27 MED ORDER — CIPROFLOXACIN IN D5W 400 MG/200ML IV SOLN
400.0000 mg | INTRAVENOUS | Status: AC
  Administered 2011-09-27: 400 mg via INTRAVENOUS

## 2011-09-27 MED ORDER — LACTATED RINGERS IV SOLN
INTRAVENOUS | Status: DC | PRN
  Administered 2011-09-27 (×2): via INTRAVENOUS

## 2011-09-27 MED ORDER — PROPOFOL 10 MG/ML IV EMUL
INTRAVENOUS | Status: DC | PRN
  Administered 2011-09-27: 180 mg via INTRAVENOUS

## 2011-09-27 MED ORDER — METOPROLOL SUCCINATE ER 25 MG PO TB24
25.0000 mg | ORAL_TABLET | ORAL | Status: DC
  Administered 2011-09-27 – 2011-09-28 (×2): 25 mg via ORAL
  Filled 2011-09-27 (×4): qty 1

## 2011-09-27 MED ORDER — CITALOPRAM HYDROBROMIDE 20 MG PO TABS
20.0000 mg | ORAL_TABLET | Freq: Every day | ORAL | Status: DC
  Administered 2011-09-27: 20 mg via ORAL
  Filled 2011-09-27 (×3): qty 1

## 2011-09-27 MED ORDER — SODIUM CHLORIDE 0.9 % IR SOLN
Status: DC | PRN
  Administered 2011-09-27: 3000 mL

## 2011-09-27 MED ORDER — MORPHINE SULFATE 2 MG/ML IJ SOLN
2.0000 mg | INTRAMUSCULAR | Status: DC | PRN
  Administered 2011-09-27 (×2): 2 mg via INTRAVENOUS
  Filled 2011-09-27 (×2): qty 1

## 2011-09-27 MED ORDER — FLUTICASONE PROPIONATE 50 MCG/ACT NA SUSP
1.0000 | Freq: Every day | NASAL | Status: DC | PRN
  Filled 2011-09-27: qty 16

## 2011-09-27 MED ORDER — ZOLPIDEM TARTRATE 10 MG PO TABS: 10.0000 mg | ORAL_TABLET | Freq: Every evening | ORAL | Status: DC | PRN

## 2011-09-27 MED ORDER — FENTANYL CITRATE 0.05 MG/ML IJ SOLN
INTRAMUSCULAR | Status: DC | PRN
  Administered 2011-09-27 (×2): 50 ug via INTRAVENOUS

## 2011-09-27 MED ORDER — POTASSIUM CHLORIDE IN NACL 20-0.45 MEQ/L-% IV SOLN
INTRAVENOUS | Status: DC
  Administered 2011-09-27: 12:00:00 via INTRAVENOUS
  Filled 2011-09-27 (×5): qty 1000

## 2011-09-27 MED ORDER — CIPROFLOXACIN HCL 250 MG PO TABS
250.0000 mg | ORAL_TABLET | Freq: Two times a day (BID) | ORAL | Status: DC
  Administered 2011-09-27 – 2011-09-28 (×3): 250 mg via ORAL
  Filled 2011-09-27 (×5): qty 1

## 2011-09-27 MED ORDER — PROMETHAZINE HCL 25 MG/ML IJ SOLN: 6.2500 mg | INTRAMUSCULAR | Status: DC | PRN

## 2011-09-27 MED ORDER — SODIUM CHLORIDE 0.9 % IR SOLN
3000.0000 mL | Status: DC
  Administered 2011-09-27 (×2): 3000 mL

## 2011-09-27 MED ORDER — BACITRACIN-NEOMYCIN-POLYMYXIN 400-5-5000 EX OINT: 1.0000 "application " | TOPICAL_OINTMENT | Freq: Three times a day (TID) | CUTANEOUS | Status: DC | PRN

## 2011-09-27 MED ORDER — HYDROCODONE-ACETAMINOPHEN 5-325 MG PO TABS
1.0000 | ORAL_TABLET | Freq: Four times a day (QID) | ORAL | Status: DC | PRN
  Administered 2011-09-27: 1 via ORAL
  Filled 2011-09-27: qty 1

## 2011-09-27 MED ORDER — HYDROCODONE-ACETAMINOPHEN 5-325 MG PO TABS
1.0000 | ORAL_TABLET | ORAL | Status: DC | PRN
  Filled 2011-09-27: qty 2

## 2011-09-27 MED ORDER — DOCUSATE SODIUM 100 MG PO CAPS
100.0000 mg | ORAL_CAPSULE | Freq: Two times a day (BID) | ORAL | Status: DC
  Administered 2011-09-27 (×2): 100 mg via ORAL
  Filled 2011-09-27 (×5): qty 1

## 2011-09-27 MED ORDER — ACETAMINOPHEN 325 MG PO TABS: 650.0000 mg | ORAL_TABLET | ORAL | Status: DC | PRN

## 2011-09-27 MED ORDER — HYDROMORPHONE HCL PF 1 MG/ML IJ SOLN
0.2500 mg | INTRAMUSCULAR | Status: DC | PRN
  Administered 2011-09-27: 0.5 mg via INTRAVENOUS

## 2011-09-27 MED ORDER — PHENAZOPYRIDINE HCL 200 MG PO TABS
200.0000 mg | ORAL_TABLET | Freq: Three times a day (TID) | ORAL | Status: DC
  Administered 2011-09-27 – 2011-09-28 (×2): 200 mg via ORAL
  Filled 2011-09-27 (×5): qty 1

## 2011-09-27 SURGICAL SUPPLY — 24 items
BAG URINE DRAINAGE (UROLOGICAL SUPPLIES) ×1 IMPLANT
BAG URO CATCHER STRL LF (DRAPE) ×2 IMPLANT
BLADE SURG 15 STRL LF DISP TIS (BLADE) IMPLANT
BLADE SURG 15 STRL SS (BLADE)
CATH FOLEY 3WAY 30CC 22FR (CATHETERS) ×1 IMPLANT
CLOTH BEACON ORANGE TIMEOUT ST (SAFETY) ×2 IMPLANT
DRAPE CAMERA CLOSED 9X96 (DRAPES) ×1 IMPLANT
ELECT BUTTON HF 24-28F 2 30DE (ELECTRODE) ×2 IMPLANT
ELECT HF RESECT BIPO 24F 45 ND (CUTTING LOOP) ×2 IMPLANT
ELECT LOOP MED HF 24F 12D (CUTTING LOOP) ×1 IMPLANT
ELECT REM PT RETURN 9FT ADLT (ELECTROSURGICAL)
ELECTRODE REM PT RTRN 9FT ADLT (ELECTROSURGICAL) ×1 IMPLANT
GLOVE SURG SS PI 8.0 STRL IVOR (GLOVE) ×2 IMPLANT
GOWN PREVENTION PLUS XLARGE (GOWN DISPOSABLE) ×2 IMPLANT
GOWN STRL REIN XL XLG (GOWN DISPOSABLE) ×2 IMPLANT
HOLDER FOLEY CATH W/STRAP (MISCELLANEOUS) IMPLANT
IV NS IRRIG 3000ML ARTHROMATIC (IV SOLUTION) ×6 IMPLANT
KIT ASPIRATION TUBING (SET/KITS/TRAYS/PACK) ×2 IMPLANT
MANIFOLD NEPTUNE II (INSTRUMENTS) ×2 IMPLANT
PACK CYSTO (CUSTOM PROCEDURE TRAY) ×2 IMPLANT
SUT ETHILON 3 0 PS 1 (SUTURE) IMPLANT
SYR 30ML LL (SYRINGE) ×1 IMPLANT
SYRINGE IRR TOOMEY STRL 70CC (SYRINGE) ×1 IMPLANT
TUBING CONNECTING 10 (TUBING) ×2 IMPLANT

## 2011-09-27 NOTE — Progress Notes (Signed)
Pt states he is unsure if he last took his glyburide this am or yesterday.

## 2011-09-27 NOTE — Interval H&P Note (Signed)
History and Physical Interval Note:   09/27/2011   8:27 AM   [name] [name]  has presented today for surgery, with the diagnosis of Benign Prostatic Hypertorphy with Bladder Outlet Obstruction  The various methods of treatment have been discussed with the patient and family. After consideration of risks, benefits and other options for treatment, the patient has consented to  Procedure(s): TRANSURETHRAL PROSTATECTOMY WITH GYRUS INSTRUMENTS as a surgical intervention .  The patients' history has been reviewed, patient examined, no change in status, stable for surgery.  I have reviewed the patients' chart and labs.  Questions were answered to the patient's satisfaction.     [name]  MD

## 2011-09-27 NOTE — Progress Notes (Signed)
[name] is doing well with clear urine on slow CBI.  He has some urgency, but is otherwise without complaints.

## 2011-09-27 NOTE — Anesthesia Preprocedure Evaluation (Addendum)
Anesthesia Evaluation  Patient identified by MRN, date of birth, ID band Patient awake    Reviewed: Allergy & Precautions, H&P , NPO status , Patient's Chart, lab work & pertinent test results, reviewed documented beta blocker date and time   Airway Mallampati: II TM Distance: >3 FB Neck ROM: Full    Dental  (+) Upper Dentures and Lower Dentures   Pulmonary neg pulmonary ROS, former smoker clear to auscultation        Cardiovascular hypertension, Pt. on medications Irregular Normal Atrial Fib, denies cardiac symptoms Off coumadin, nl INR   Neuro/Psych Negative Neurological ROS  Negative Psych ROS   GI/Hepatic negative GI ROS, Neg liver ROS,   Endo/Other  Diabetes mellitus-, Poorly Controlled, Type 2SSI peri-op  Renal/GU negative Renal ROS   BPH    Musculoskeletal negative musculoskeletal ROS (+)   Abdominal   Peds negative pediatric ROS (+)  Hematology negative hematology ROS (+)   Anesthesia Other Findings   Reproductive/Obstetrics negative OB ROS                          Anesthesia Physical Anesthesia Plan  ASA: III  Anesthesia Plan: General   Post-op Pain Management:    Induction: Intravenous  Airway Management Planned: LMA  Additional Equipment:   Intra-op Plan:   Post-operative Plan: Extubation in OR  Informed Consent: I have reviewed the patients History and Physical, chart, labs and discussed the procedure including the risks, benefits and alternatives for the proposed anesthesia with the patient or authorized representative who has indicated his/her understanding and acceptance.     Plan Discussed with: CRNA and Surgeon  Anesthesia Plan Comments:         Anesthesia Quick Evaluation

## 2011-09-27 NOTE — Brief Op Note (Signed)
09/27/2011  9:49 AM  PATIENT:  [name] [name]  77 y.o. male  PRE-OPERATIVE DIAGNOSIS:  Benign Prostatic Hypertorphy with Bladder Outlet Obstruction  POST-OPERATIVE DIAGNOSIS:  benign hypertrophy with bladder outlet obstruction  PROCEDURE:  Procedure(s): TRANSURETHRAL PROSTATECTOMY WITH GYRUS INSTRUMENTS  SURGEON:  Surgeon(s): [name]  PHYSICIAN ASSISTANT:   ASSISTANTS: none   ANESTHESIA:   general  EBL:     BLOOD ADMINISTERED:none  DRAINS: Urinary Catheter (Foley)   LOCAL MEDICATIONS USED:  NONE  SPECIMEN:  Source of Specimen:  Prostate chips  DISPOSITION OF SPECIMEN:  PATHOLOGY  COUNTS:  YES  TOURNIQUET:  * No tourniquets in log *  DICTATION: .Other Dictation: Dictation Number [id number]  PLAN OF CARE: Admit for overnight observation  PATIENT DISPOSITION:  PACU - hemodynamically stable.   Delay start of Pharmacological VTE agent (>24hrs) due to surgical blood loss or risk of bleeding:  {YES/NO/NOT APPLICABLE:20182

## 2011-09-27 NOTE — Op Note (Signed)
[name] [name], [name] [name]                ACCOUNT NO.:  [id number]  MEDICAL RECORD NO.:  [id number]  LOCATION:                               FACILITY:  [facility]  PHYSICIAN:  Excell Seltzer. Annabell Howells, M.D.    DATE OF BIRTH:  04-04-1932  DATE OF PROCEDURE:  09/26/2011 DATE OF DISCHARGE:                              OPERATIVE REPORT   PROCEDURE:  Transurethral resection of prostate.  PREOPERATIVE DIAGNOSES:  Benign prostate hypertrophy and bladder outlet obstruction.  POSTOPERATIVE DIAGNOSES:  Benign prostate hypertrophy and bladder outlet obstruction.  SURGEON:  Excell Seltzer. Annabell Howells, M.D.  ANESTHESIA:  General.  SPECIMEN:  Prostate chips.  BLOOD LOSS:  100 cc.  DRAINS:  22-French 3-way Foley catheter.  COMPLICATIONS:  None.  INDICATIONS:  [name] [name] is a 77 year old white male with BPH and bladder outlet obstruction.  He has elected to undergo TURP.  FINDINGS OF PROCEDURE:  He was given Cipro.  He was taken to operating room, where general anesthetic was induced.  PAS hoses were placed.  He was positioned in the lithotomy position.  His perineum and genitalia were prepped with Betadine solution.  He was draped in usual sterile fashion and time-out was performed.  The 22-French cystoscope with 12 and 70 degree lenses were used to perform endoscopy.  This revealed a normal urethra.  The external sphincter was intact.  Prostatic urethra had trilobar hyperplasia with obstruction.  It was approximately 3 cm in length.  The bladder wall had mild to moderate trabeculation with some cellules.  No tumors, stones, or inflammation were noted.  Ureteral orifices were unremarkable well away from the bladder neck.  Urethra was then calibrated to 30-French with Sissy Hoff sounds, and a 28- French continuous flow resectoscope sheath was inserted.  This was fitted with an Wandra Scot handle with a Gyrus loop and 12 degree lens. Saline was used as the irrigant.  The middle lobe was resected from 5 to 7 o'clock,  down to the capsular fibers, followed by the floor of the prostate after alongside the verumontanum.  The right lobe of the prostate was resected from bladder neck to apex out to the capsular fibers, followed by the left lobe in a similar fashion.  At this point, chips were evacuated and additional resection was performed in the apical and anterior areas as needed.  Once an adequate resection had been performed, all chips were evacuated. Hemostasis was achieved.  Inspection revealed intact ureteral orifices. No active bleeding and no retained chips.  The bladder was left full. The scope was removed.  Pressure on the bladder produced an excellent stream.  A 22-French 3-way Foley catheter was inserted with the aid of a catheter guide.  Balloon was filled with 30 cc of sterile fluid.  The catheter was placed on straight drainage and continuous irrigation after hand irrigation returned clear.  At this point, the patient was taken down from lithotomy position.  His anesthetic was reversed.  He was moved to recovery room in stable condition.  There were no complications.     Excell Seltzer. Annabell Howells, M.D.     JJW/MEDQ  D:  09/27/2011  T:  09/27/2011  Job:  [id number]  cc:   Scott A. Gerda Diss, MD Fax: [phone]

## 2011-09-27 NOTE — Anesthesia Postprocedure Evaluation (Signed)
  Anesthesia Post-op Note  Patient: [name] [name]  Procedure(s) Performed:  TRANSURETHRAL PROSTATECTOMY WITH GYRUS INSTRUMENTS  Patient Location: PACU  Anesthesia Type: General  Level of Consciousness: oriented and sedated  Airway and Oxygen Therapy: Patient Spontanous Breathing and Patient connected to nasal cannula oxygen  Post-op Pain: mild  Post-op Assessment: Post-op Vital signs reviewed, Patient's Cardiovascular Status Stable, Respiratory Function Stable and Patent Airway  Post-op Vital Signs: stable  Complications: No apparent anesthesia complications

## 2011-09-27 NOTE — Transfer of Care (Signed)
Immediate Anesthesia Transfer of Care Note  Patient: [name] [name]  Procedure(s) Performed:  TRANSURETHRAL PROSTATECTOMY WITH GYRUS INSTRUMENTS  Patient Location: PACU  Anesthesia Type: General  Level of Consciousness: awake and alert   Airway & Oxygen Therapy: Patient Spontanous Breathing and Patient connected to face mask oxygen  Post-op Assessment: Report given to PACU RN  Post vital signs: Reviewed and stable  Complications: No apparent anesthesia complications

## 2011-09-28 LAB — BASIC METABOLIC PANEL
BUN: 11 mg/dL (ref 6–23)
Chloride: 100 mEq/L (ref 96–112)
GFR calc Af Amer: >90 mL/min (ref >90)
Potassium: 4.5 mEq/L (ref 3.5–5.1)
Sodium: 135 mEq/L (ref 135–145)

## 2011-09-28 LAB — GLUCOSE, CAPILLARY
Glucose-Capillary: 132 mg/dL — ABNORMAL HIGH (ref 70–99)
Glucose-Capillary: 146 mg/dL — ABNORMAL HIGH (ref 70–99)

## 2011-09-28 MED ORDER — DSS 100 MG PO CAPS: 100.0000 mg | ORAL_CAPSULE | Freq: Two times a day (BID) | ORAL | Status: AC

## 2011-09-28 MED ORDER — OMEGA-3 FATTY ACIDS 1000 MG PO CAPS: 1.0000 g | ORAL_CAPSULE | Freq: Two times a day (BID) | ORAL | Status: DC

## 2011-09-28 NOTE — Progress Notes (Signed)
Patient discharged to home. DC instructions given with no concerns voiced. Prescription x 1 given for stool softener. Left unit via wheelchair pushed by nurse tech. Left in good condition. Ambulatory without difficulties prior to discharge.

## 2011-09-28 NOTE — Discharge Summary (Signed)
Physician Discharge Summary  Patient ID: [name] [name] MRN: [id number] DOB/AGE: Jul 14, 1932 77 y.o.  Admit date: 09/27/2011 Discharge date: 09/28/2011  Admission Diagnoses:BPH with BOO.  Discharge Diagnoses:  Principal Problem:  *BPH (benign prostatic hypertrophy) with urinary obstruction   Discharged Condition: good  Hospital Course: He had a TURP on 11/12 and had an uneventful postop course.  The foley was removed this morning and he will be sent home when voiding.  Consults: none  Significant Diagnostic Studies: labs: BMP  Treatments: surgery: TURP  Discharge Exam: Blood pressure 144/64, pulse 68, temperature 97.8 F (36.6 C), temperature source Oral, resp. rate 16, height 6\' 2"  (1.88 m), weight 114.76 kg (253 lb), SpO2 95.00%. General appearance: alert Male genitalia: normal, Foley draining clear urine.  Disposition:   Discharge Orders    Future Orders Please Complete By Expires   Diet - low sodium heart healthy      Foley catheter - discontinue      Scheduling Instructions:   Remove now and keep a string of three bottles.   Home when voiding.   Increase activity slowly      Driving Restrictions      Comments:   No driving for a week   Lifting restrictions      Comments:   No lifting over 5 pounds for 1 week.   Sexual Activity Restrictions      Comments:   No sex for 3 weeks.   Call MD for:  temperature >100.4      Call MD for:  severe uncontrolled pain      Discontinue IV        Current Discharge Medication List    START taking these medications   Details  docusate sodium 100 MG CAPS Take 100 mg by mouth 2 (two) times daily. Qty: 60 capsule, Refills: 1      CONTINUE these medications which have CHANGED   Details  fish oil-omega-3 fatty acids 1000 MG capsule Take 1 capsule (1 g total) by mouth 2 (two) times daily. Qty: 30 capsule, Refills: 0      CONTINUE these medications which have NOT CHANGED   Details  citalopram (CELEXA) 20 MG tablet Take  20 mg by mouth daily.     HYDROcodone-acetaminophen (NORCO) 5-325 MG per tablet Take 1 tablet by mouth every 6 (six) hours as needed. pain    metoprolol succinate (TOPROL-XL) 25 MG 24 hr tablet Take 25 mg by mouth every morning.     traZODone (DESYREL) 100 MG tablet Take 100 mg by mouth at bedtime.     zolpidem (AMBIEN) 10 MG tablet Take 10 mg by mouth at bedtime as needed.     fluticasone (FLONASE) 50 MCG/ACT nasal spray Place 1 spray into the nose daily as needed. Allergies     glyBURIDE micronized (GLYNASE) 3 MG tablet Take 3 mg by mouth daily with breakfast.     omeprazole (PRILOSEC) 20 MG capsule Take 20 mg by mouth daily.     warfarin (COUMADIN) 5 MG tablet Take 2.5-5 mg by mouth every evening. 2.5mg  on Monday and all other day is 5 mg.       Follow-up Information    Follow up with [facility] (As previously scheduled.)    Contact information:   [address] [facility] [place] [address] [phone]        A/P:   His foley will be removed this morning and he will be  D/C'd home when voiding. IV and CBI DC'd. His Glucose is 142.  SignedAnner Crete 09/28/2011, 6:43 AM

## 2011-10-16 ENCOUNTER — Encounter: Payer: Self-pay | Admitting: Cardiology

## 2011-11-17 DIAGNOSIS — E119 Type 2 diabetes mellitus without complications: Secondary | ICD-10-CM | POA: Diagnosis not present

## 2011-11-17 DIAGNOSIS — I4891 Unspecified atrial fibrillation: Secondary | ICD-10-CM | POA: Diagnosis not present

## 2011-11-17 DIAGNOSIS — R358 Other polyuria: Secondary | ICD-10-CM | POA: Diagnosis not present

## 2011-11-18 DIAGNOSIS — M25519 Pain in unspecified shoulder: Secondary | ICD-10-CM | POA: Diagnosis not present

## 2011-12-01 ENCOUNTER — Encounter (INDEPENDENT_AMBULATORY_CARE_PROVIDER_SITE_OTHER): Payer: Self-pay | Admitting: *Deleted

## 2011-12-01 ENCOUNTER — Other Ambulatory Visit (INDEPENDENT_AMBULATORY_CARE_PROVIDER_SITE_OTHER): Payer: Self-pay | Admitting: *Deleted

## 2011-12-01 DIAGNOSIS — I4891 Unspecified atrial fibrillation: Secondary | ICD-10-CM | POA: Diagnosis not present

## 2011-12-01 DIAGNOSIS — Z1211 Encounter for screening for malignant neoplasm of colon: Secondary | ICD-10-CM

## 2011-12-12 DIAGNOSIS — N139 Obstructive and reflux uropathy, unspecified: Secondary | ICD-10-CM | POA: Diagnosis not present

## 2011-12-12 DIAGNOSIS — R82998 Other abnormal findings in urine: Secondary | ICD-10-CM | POA: Diagnosis not present

## 2011-12-15 DIAGNOSIS — I4891 Unspecified atrial fibrillation: Secondary | ICD-10-CM | POA: Diagnosis not present

## 2011-12-15 DIAGNOSIS — M75100 Unspecified rotator cuff tear or rupture of unspecified shoulder, not specified as traumatic: Secondary | ICD-10-CM | POA: Diagnosis not present

## 2011-12-15 DIAGNOSIS — M719 Bursopathy, unspecified: Secondary | ICD-10-CM | POA: Diagnosis not present

## 2011-12-15 DIAGNOSIS — M67919 Unspecified disorder of synovium and tendon, unspecified shoulder: Secondary | ICD-10-CM | POA: Diagnosis not present

## 2011-12-15 DIAGNOSIS — S46819A Strain of other muscles, fascia and tendons at shoulder and upper arm level, unspecified arm, initial encounter: Secondary | ICD-10-CM | POA: Diagnosis not present

## 2011-12-15 DIAGNOSIS — M25519 Pain in unspecified shoulder: Secondary | ICD-10-CM | POA: Diagnosis not present

## 2011-12-15 DIAGNOSIS — M19019 Primary osteoarthritis, unspecified shoulder: Secondary | ICD-10-CM | POA: Diagnosis not present

## 2011-12-16 HISTORY — PX: PROSTATE SURGERY: SHX751

## 2011-12-23 DIAGNOSIS — M25519 Pain in unspecified shoulder: Secondary | ICD-10-CM | POA: Diagnosis not present

## 2011-12-27 ENCOUNTER — Telehealth (INDEPENDENT_AMBULATORY_CARE_PROVIDER_SITE_OTHER): Payer: Self-pay | Admitting: *Deleted

## 2011-12-27 NOTE — Telephone Encounter (Signed)
agree

## 2011-12-27 NOTE — Telephone Encounter (Signed)
Requesting MD/PCP:  [name] luking     Name & DOB: Carroll Reaney 08-Apr-2032     Procedure: tcs  Reason/Indication:  screening  Has patient had this procedure before?  yes  If so, when, by whom and where?  Over 10 yrs ago  Is there a family history of colon cancer?  no  Who?  What age when diagnosed?    Is patient diabetic?   yes      Does patient have prosthetic heart valve?  no  Do you have a pacemaker?  no  Has patient had joint replacement within last 12 months?  no  Is patient on Coumadin, Plavix and/or Aspirin? yes  Medications: coumadin 5 mg 1 tab tues-sun and 1 1/2 tab mon, metoprolol 25 mg daily, glyburide 3 mg daily (a.m.), zolpidem 10 mg daily, hydrocodone/acetaminophen 5/325 1 tab qid, fluticasone nasal spray, fish oil 1200 mg daily  Allergies: nkda  Pharmacy: [facility]  Medication Adjustment: coumadin 5 days, hold glyburide day before  Procedure date & time: 01/19/12 at 1030

## 2011-12-30 ENCOUNTER — Ambulatory Visit (HOSPITAL_COMMUNITY)
Admission: RE | Admit: 2011-12-30 | Discharge: 2011-12-30 | Disposition: A | Discharge status: Home / Self Care | Payer: Medicare Other | Source: Ambulatory Visit | Attending: Orthopaedic Surgery | Admitting: Orthopaedic Surgery

## 2011-12-30 DIAGNOSIS — I10 Essential (primary) hypertension: Secondary | ICD-10-CM | POA: Insufficient documentation

## 2011-12-30 DIAGNOSIS — M25619 Stiffness of unspecified shoulder, not elsewhere classified: Secondary | ICD-10-CM | POA: Diagnosis not present

## 2011-12-30 DIAGNOSIS — IMO0001 Reserved for inherently not codable concepts without codable children: Secondary | ICD-10-CM | POA: Insufficient documentation

## 2011-12-30 DIAGNOSIS — M6281 Muscle weakness (generalized): Secondary | ICD-10-CM | POA: Insufficient documentation

## 2011-12-30 DIAGNOSIS — M25519 Pain in unspecified shoulder: Secondary | ICD-10-CM | POA: Insufficient documentation

## 2011-12-30 DIAGNOSIS — E785 Hyperlipidemia, unspecified: Secondary | ICD-10-CM | POA: Insufficient documentation

## 2011-12-30 DIAGNOSIS — E119 Type 2 diabetes mellitus without complications: Secondary | ICD-10-CM | POA: Diagnosis not present

## 2011-12-30 NOTE — Evaluation (Signed)
Occupational Therapy Evaluation  Patient Details  Name: [name] [name] MRN: [id number] Date of Birth: 06-12-1932  Today's Date: 12/30/2011 Time: 8657-8469 Evaluation   30' Patient education 15' Time Calculation (min): 45 min  Visit#: 1  of 18   Re-eval: 01/23/12  Assessment Diagnosis: L Partial Rotator Cuff Injury Next MD Visit: 3/13 Prior Therapy: None  Past Medical History:  Past Medical History  Diagnosis Date  . Hypertension     Borderline  . Cerebrovascular disease 07/2010    TIA; carotid ultrasound in 07/2010-significant bilateral plaque without focal internal carotid artery stenosis; MRI -encephalomalacia left temporal and right temporal lobes; small inferior right cerebellar infarct; small vessel disease  . Hyperlipidemia     adverse reactions to statins and niacin  . History of noncompliance with medical treatment   . Aortic stenosis 2007    Very mild  . Hepatic steatosis   . Diabetes mellitus     no insulin; A1c of 6.6 in 2005  . Atrial fibrillation     Paroxysmal; Echocardiogram in 2007-normal EF; mild LVH; left atrial enlargement; mild stenosis and minimal AI; negative stress nuclear study in 2008  . Exertional dyspnea   . Degenerative joint disease     feet and legs  . Dizziness     occurs daily,especially in am  . Gastroesophageal reflux disease    Past Surgical History:  Past Surgical History  Procedure Date  . Rotator cuff repair     Right  . Lipoma excision 1980  . Urethral stricture dilatation 1980s  . Orif ankle fracture 2000    Right  . Colonoscopy 2002  . Transurethral resection of prostate 09/2011    Subjective Symptoms/Limitations Symptoms: Mr. Morgenthaler is a married retired gentleman presenting with pain on elevation of his left shoulder  Limitations: Patient states 3-4 month history of pain in the left shoulder. Recent MRI and  consult with Dr.Whitfield revealed a partial rotator cuff tear. Pain Assessment Currently in Pain?: Yes Pain  Score:   5 Pain Location: Shoulder Pain Orientation: Left Pain Type: Acute pain Pain Radiating Towards: Anterior and radiates to lateral and medial portion of upper arm. Pain Onset: Other (comment) (3-4 months) Pain Frequency: Constant Pain Relieving Factors: rest,  Effect of Pain on Daily Activities: pain with shoulder elevation into flexion and abduction Multiple Pain Sites: No  Precautions/Restrictions  Precautions Precautions: Shoulder  Prior Functioning  Home Living Lives With: Spouse Prior Function Level of Independence: Independent with basic ADLs;Independent with homemaking with ambulation;Independent with gait;Independent with transfers Able to Take Stairs?: Yes Driving: Yes  Assessment ADL/Vision/Perception Vision - History Baseline Vision: No visual deficits Vision - Assessment Eye Alignment: Within Functional Limits Perception Perception: Within Functional Limits Praxis Praxis: Intact  Cognition/Observation Cognition Overall Cognitive Status: Appears within functional limits for tasks assessed Arousal/Alertness: Awake/alert Orientation Level: Oriented X4 Safety/Judgment: Appears intact  Sensation/Coordination/Edema Sensation Light Touch: Appears Intact Proprioception: Appears Intact Coordination Gross Motor Movements are Fluid and Coordinated: Yes Fine Motor Movements are Fluid and Coordinated: Yes Edema Edema: None Noted  Additional Assessments RUE Assessment RUE Assessment: Within Functional Limits LUE Assessment LUE Assessment: Exceptions to [facility] LUE AROM (degrees) Left Shoulder Flexion  0-170: 135 Degrees Left Shoulder ABduction 0-40: 115 Degrees Left Shoulder Internal Rotation  0-70: 70 Degrees Left Shoulder External Rotation  0-90: 70 Degrees LUE PROM (degrees) Left Shoulder Flexion  0-170: 160 Degrees Left Shoulder ABduction 0-40: 138 Degrees Left Shoulder Internal Rotation  0-70: 70 Degrees Left Shoulder External Rotation  0-90: 70  Degrees  Exercise/Treatments Trained in table slides to do at home until next session.  Occupational Therapy Assessment and Plan OT Assessment and Plan Clinical Impression Statement: A: 78 y/o married male. Wife is cared for at home by a CNA [phone] (over the last 15 years). He is cirrently experiencing  left shoulder pain with decreased mobility, strength and increased pain with decreased functional use secondary to a partial rotator cuff tear. Rehab Potential: Good OT Frequency: Min 3X/week OT Duration: 6 weeks OT Treatment/Interventions: Self-care/ADL training;Therapeutic exercise;Therapeutic activities;Manual therapy;Patient/family education;Modalities OT Plan: P: Skilled OT indicated 3 x a week for 6-8 weeks to return patient to prior level of function and restore mobility, strength and decrease pain.   Goals Home Exercise Program Pt will Perform Home Exercise Program: Independently PT Goal: Perform Home Exercise Program - Progress: Goal set today Short Term Goals Time to Complete Short Term Goals: 3 weeks Short Term Goal 1: Patient will state decreased pain 2/10 with movement  Short Term Goal 2: Increase AROM by 20 degrees Flex and Abd from supine position Short Term Goal 3: Patient will be able to dress himself using BUE's without pain Short Term Goal 4: Patient will be independent with HEP for Stretching and light strengthening. Short Term Goal 5: [name] will increase strenght to 4/5 from 3/5 Long Term Goals Time to Complete Long Term Goals: 8 weeks Long Term Goal 1: [name] will be able to wash and comb his hair with the left UE. Long Term Goal 2: [name] will be able to demonstrate 5/5 strength in Flex and Abd and ER of Left shoulder. Long Term Goal 3: Patient will be able to reach forward and to both sides without gaurding. Long Term Goal 4: Patient will be able to continue independent with strengthening program at home Long Term Goal 5: Patient will return to full  functional mobility and use of the left UE  Problem List Patient Active Problem List  Diagnoses  . Hypertension  . Cerebrovascular disease  . Atrial fibrillation  . Hyperlipidemia  . History of noncompliance with medical treatment  . Aortic stenosis  . Diabetes mellitus  . Dyspnea on exertion  . BPH (benign prostatic hypertrophy) with urinary obstruction    End of Session Activity Tolerance: Patient tolerated treatment well General Behavior During Session: [facility] for tasks performed Cognition: [facility] for tasks performed OT Plan of Care OT Home Exercise Plan: [name] trained in table slides and given a hand out. Consulted and Agree with Plan of Care: Patient   Lisa Roca OTR/L 12/30/2011, 5:06 PM  Physician Documentation Your signature is required to indicate approval of the treatment plan as stated above.  Please sign and either send electronically or make a copy of this report for your files and return this physician signed original.  Please mark one 1.__approve of plan  2. ___approve of plan with the following conditions.   ______________________________                                                          _____________________ Physician Signature  Date  

## 2012-01-03 ENCOUNTER — Ambulatory Visit (HOSPITAL_COMMUNITY)
Admission: RE | Admit: 2012-01-03 | Discharge: 2012-01-03 | Disposition: A | Discharge status: Home / Self Care | Payer: Medicare Other | Source: Ambulatory Visit | Attending: Orthopaedic Surgery | Admitting: Orthopaedic Surgery

## 2012-01-03 NOTE — Progress Notes (Signed)
Occupational Therapy Treatment  Patient Details  Name: [name] [name] MRN: [id number] Date of Birth: 1932-10-27  Today's Date: 01/03/2012 Time: 1020-1110 Time Calculation (min): 50 min Visit#: 2  of 18   Re-eval: 01/23/12   Subjective Symptoms/Limitations Symptoms: [name] [name] states he is still having pain in his left shoulder. Pain Assessment Currently in Pain?: Yes Pain Score:   5 Pain Location: Shoulder Pain Orientation: Left Pain Type: Acute pain  Precautions/Restrictions Slow progression.   Exercise/Treatments Supine Protraction: PROM;AAROM;20 reps Horizontal ABduction: PROM;AAROM;10 reps External Rotation: PROM;AAROM;10 reps Internal Rotation: PROM;AAROM;10 reps Flexion: PROM;10 reps ABduction: PROM;10 reps Seated Elevation: AROM;20 reps Extension: AROM;20 reps Retraction: AROM;10 reps Row: AROM;20 reps Therapy Ball Flexion: 20 reps ABduction: 20 reps ROM / Strengthening / Isometric Strengthening Pendulum: 5 Prot/Ret//Elev/Dep: 12 Flexion: 5X5" Extension: 5X5" External Rotation: 5X5" Internal Rotation: 5X5"  Occupational Therapy Assessment and Plan OT Assessment and Plan Clinical Impression Statement: A: Patient advanced to supine submaximal isometrics in the clinic only. Trained also in elevation, protraction, retraction and depression. Trained in pendelum exercises.  Rehab Potential: Good OT Frequency: Min 3X/week OT Duration: 6 weeks OT Treatment/Interventions: Self-care/ADL training;Therapeutic exercise;Therapeutic activities;Manual therapy;Patient/family education;Modalities OT Plan: P: [name] still having pain. Slowly advance exercises within pain tolerance without creating pain. Mobility of shoulder has improved with decreased tighness and restrictions.   Goals Home Exercise Program Pt will Perform Home Exercise Program: Independently Short Term Goals Time to Complete Short Term Goals: 3 weeks Short Term Goal 1: Patient will state decreased  pain 2/10 with movement  Short Term Goal 1 Progress: Progressing toward goal Short Term Goal 2: Increase AROM by 20 degrees Flex and Abd from supine position Short Term Goal 2 Progress: Progressing toward goal Short Term Goal 3: Patient will be able to dress himself using BUE's without pain Short Term Goal 4: Patient will be independent with HEP for Stretching and light strengthening. Short Term Goal 4 Progress: Partly met Short Term Goal 5: [name] will increase strenght to 4/5 from 3/5 Long Term Goals Time to Complete Long Term Goals: 8 weeks Long Term Goal 1: [name] will be able to wash and comb his hair with the left UE. Long Term Goal 2: [name] will be able to demonstrate 5/5 strength in Flex and Abd and ER of Left shoulder. Long Term Goal 3: Patient will be able to reach forward and to both sides without gaurding. Long Term Goal 4: Patient will be able to continue independent with strengthening program at home Long Term Goal 5: Patient will return to full functional mobility and use of the left UE  Problem List Patient Active Problem List  Diagnoses  . Hypertension  . Cerebrovascular disease  . Atrial fibrillation  . Hyperlipidemia  . History of noncompliance with medical treatment  . Aortic stenosis  . Diabetes mellitus  . Dyspnea on exertion  . BPH (benign prostatic hypertrophy) with urinary obstruction    End of Session Activity Tolerance: Patient tolerated treatment well General Behavior During Session: [facility] for tasks performed Cognition: [facility] for tasks performed  GO No functional reporting required  Lisa Roca OTR/L 01/03/2012, 11:23 AM

## 2012-01-04 ENCOUNTER — Ambulatory Visit (HOSPITAL_COMMUNITY)
Admission: RE | Admit: 2012-01-04 | Discharge: 2012-01-04 | Disposition: A | Discharge status: Home / Self Care | Payer: Medicare Other | Source: Ambulatory Visit | Attending: Orthopaedic Surgery | Admitting: Orthopaedic Surgery

## 2012-01-04 NOTE — Progress Notes (Signed)
Occupational Therapy Treatment  Patient Details  Name: QUINDELL SHERE MRN: [id number] Date of Birth: 1932/08/21  Today's Date: 01/04/2012 Time: 4098-1191 Time Calculation (min): 45 min  Visit#: 3  of 18   Re-eval: 01/23/12 Manual Therapy 140-159 19' Therapeutic Exercise 200-225 25'    Subjective Symptoms/Limitations Symptoms: S:  I am tired, I woke up in the middle of the night hurting, I used some ice and it helped.  Precautions/Restrictions     Exercise/Treatments Supine Protraction: PROM;10 reps;AAROM;12 reps Horizontal ABduction: PROM;10 reps;AAROM;12 reps External Rotation: PROM;10 reps;AAROM;12 reps Internal Rotation: PROM;10 reps;AAROM;12 reps Flexion: PROM;10 reps;AAROM;15 reps ABduction: PROM;10 reps;AROM;15 reps Seated Elevation: AROM;20 reps Extension: AROM;20 reps Retraction: AROM;10 reps Row: AROM;20 reps Therapy Ball Flexion: 20 reps ABduction: 20 reps ROM / Strengthening / Isometric Strengthening UBE (Upper Arm Bike): 3' forward 3' backward 1.0 Wall Wash: 2'        Manual Therapy Manual Therapy: Myofascial release Myofascial Release: MFR and manual stretching to left UE to provide realease from restrictions to decrease pain and increase A/PROM so patient can return to pain free ADL's  Occupational Therapy Assessment and Plan OT Assessment and Plan Clinical Impression Statement: A: Added AAROM supine with dowel, wall wash and UBE.  Patient tolerated all well. Rehab Potential: Good OT Plan: P:  Add thumbtacks.   Goals Home Exercise Program Pt will Perform Home Exercise Program: Independently Short Term Goals Time to Complete Short Term Goals: 3 weeks Short Term Goal 1: Patient will state decreased pain 2/10 with movement  Short Term Goal 2: Increase AROM by 20 degrees Flex and Abd from supine position Short Term Goal 3: Patient will be able to dress himself using BUE's without pain Short Term Goal 4: Patient will be independent with HEP for  Stretching and light strengthening. Short Term Goal 5: [name] will increase strenght to 4/5 from 3/5 Long Term Goals Time to Complete Long Term Goals: 8 weeks Long Term Goal 1: [name] will be able to wash and comb his hair with the left UE. Long Term Goal 2: [name] will be able to demonstrate 5/5 strength in Flex and Abd and ER of Left shoulder. Long Term Goal 3: Patient will be able to reach forward and to both sides without gaurding. Long Term Goal 4: Patient will be able to continue independent with strengthening program at home Long Term Goal 5: Patient will return to full functional mobility and use of the left UE  Problem List Patient Active Problem List  Diagnoses  . Hypertension  . Cerebrovascular disease  . Atrial fibrillation  . Hyperlipidemia  . History of noncompliance with medical treatment  . Aortic stenosis  . Diabetes mellitus  . Dyspnea on exertion  . BPH (benign prostatic hypertrophy) with urinary obstruction    End of Session Activity Tolerance: Patient tolerated treatment well General Behavior During Session: [facility] for tasks performed Cognition: [facility] for tasks performed  GO No functional reporting required   Regene Mccarthy L. Noralee Stain, COTA/L  01/04/2012, 3:44 PM

## 2012-01-06 ENCOUNTER — Ambulatory Visit (HOSPITAL_COMMUNITY)
Admission: RE | Admit: 2012-01-06 | Discharge: 2012-01-06 | Disposition: A | Discharge status: Home / Self Care | Payer: Medicare Other | Source: Ambulatory Visit | Attending: Family Medicine | Admitting: Family Medicine

## 2012-01-06 NOTE — Progress Notes (Signed)
Occupational Therapy Treatment  Patient Details  Name: ABDULMALIK [name] MRN: [id number] Date of Birth: [birth year]  Today's Date: 01/06/2012 Time: 1478-2956 Manual therapy 10 min Therapeutic Exercise 28 min Time Calculation (min): 38 min  Visit#: 4  of 18   Re-eval: 01/23/12    Subjective Symptoms/Limitations Symptoms: S: Patient indicates he had a little pain after last treatment but it is now gone Repetition: Decreases Symptoms Pain Assessment Currently in Pain?: No/denies Pain Score: 0-No pain  Precautions/Restrictions To Tolerance    Exercise/Treatments Supine Protraction: PROM;10 reps;AAROM;12 reps Horizontal ABduction: PROM;10 reps;AAROM;12 reps External Rotation: PROM;10 reps;AAROM;12 reps Internal Rotation: PROM;10 reps;AAROM;12 reps Flexion: PROM;10 reps;AAROM;15 reps ABduction: PROM;10 reps;AROM;15 reps Seated Elevation: AROM;20 reps Extension: AROM;20 reps Retraction: AROM;10 reps Row: AROM;20 reps Therapy Ball Flexion: 20 reps ABduction: 20 reps ROM / Strengthening / Isometric Strengthening UBE (Upper Arm Bike): 3' forward 3' backward 1.0 Flexion: 5X5" Extension: 5X5" External Rotation: 5X5" Internal Rotation: 5X5"  Manual Therapy Manual Therapy: Joint mobilization Joint Mobilization: Joint mobilization of shoulder in supine  Occupational Therapy Assessment and Plan OT Assessment and Plan Clinical Impression Statement: A: Patient tolerated all treatment nicely Rehab Potential: Good OT Frequency: Min 3X/week OT Duration: 6 weeks OT Treatment/Interventions: Self-care/ADL training;Therapeutic exercise;Therapeutic activities;Manual therapy OT Plan: P: Patient gainining in strenght with isometric exercies. Continue to advance as tolerated   Goals Home Exercise Program Pt will Perform Home Exercise Program: Independently Short Term Goals Time to Complete Short Term Goals: 3 weeks Short Term Goal 1: Patient will state decreased pain 2/10 with  movement  Short Term Goal 2: Increase AROM by 20 degrees Flex and Abd from supine position Short Term Goal 3: Patient will be able to dress himself using BUE's without pain Short Term Goal 4: Patient will be independent with HEP for Stretching and light strengthening. Short Term Goal 5: [name] will increase strenght to 4/5 from 3/5 Long Term Goals Time to Complete Long Term Goals: 8 weeks Long Term Goal 1: [name] will be able to wash and comb his hair with the left UE. Long Term Goal 2: [name] will be able to demonstrate 5/5 strength in Flex and Abd and ER of Left shoulder. Long Term Goal 3: Patient will be able to reach forward and to both sides without gaurding. Long Term Goal 4: Patient will be able to continue independent with strengthening program at home Long Term Goal 5: Patient will return to full functional mobility and use of the left UE  Problem List Patient Active Problem List  Diagnoses  . Hypertension  . Cerebrovascular disease  . Atrial fibrillation  . Hyperlipidemia  . History of noncompliance with medical treatment  . Aortic stenosis  . Diabetes mellitus  . Dyspnea on exertion  . BPH (benign prostatic hypertrophy) with urinary obstruction   End of Session Activity Tolerance: Patient tolerated treatment well General Behavior During Session: [facility] for tasks performed Cognition: [facility] for tasks performed  Lisa Roca OTR/L 01/06/2012, 11:46 AM

## 2012-01-10 DIAGNOSIS — N401 Enlarged prostate with lower urinary tract symptoms: Secondary | ICD-10-CM | POA: Diagnosis not present

## 2012-01-10 DIAGNOSIS — N3941 Urge incontinence: Secondary | ICD-10-CM | POA: Diagnosis not present

## 2012-01-11 ENCOUNTER — Ambulatory Visit (HOSPITAL_COMMUNITY)
Admission: RE | Admit: 2012-01-11 | Discharge: 2012-01-11 | Disposition: A | Discharge status: Home / Self Care | Payer: Medicare Other | Source: Ambulatory Visit | Attending: Orthopaedic Surgery | Admitting: Orthopaedic Surgery

## 2012-01-11 NOTE — Progress Notes (Signed)
Occupational Therapy Treatment  Patient Details  Name: HY SWIATEK MRN: [id number] Date of Birth: [birth year]  Today's Date: 01/11/2012 Time: 4098-1191 Time Calculation (min): 36 min  Visit#: 5  of 18   Re-eval: 01/23/12 Manual Therapy 316-331 15' Therapeutic Exercise 332-352 20'    Subjective Symptoms/Limitations Symptoms: S;  I don't have any pain to amount to anything. Pain Assessment Currently in Pain?: No/denies  Precautions/Restrictions     Exercise/Treatments Supine Protraction: PROM;10 reps;AAROM;15 reps Horizontal ABduction: PROM;10 reps;AAROM;15 reps External Rotation: PROM;10 reps;AAROM;15 reps Internal Rotation: PROM;10 reps;AAROM;15 reps Flexion: PROM;10 reps;AAROM;15 reps ABduction: PROM;10 reps;AROM;15 reps Seated Elevation: AROM;10 reps Extension: AROM;10 reps Retraction: AROM;10 reps Row: AROM;10 reps Therapy Ball Flexion: 20 reps ABduction: 20 reps ROM / Strengthening / Isometric Strengthening UBE (Upper Arm Bike): 3' forward 3' backward 1.5 Wall Wash: 3'       Manual Therapy Manual Therapy: Myofascial release Myofascial Release: MFR and manual stretching to left UE to provide realease from restrictions to decrease pain and increase A/PROM so patient can return to pain free ADL's  Occupational Therapy Assessment and Plan OT Assessment and Plan Clinical Impression Statement: A:  Increased time with wall wash which fatiques patient but was able to complete all ex Rehab Potential: Good OT Plan: P:  Add tband for scapular strengthening.   Goals Home Exercise Program Pt will Perform Home Exercise Program: Independently Short Term Goals Time to Complete Short Term Goals: 3 weeks Short Term Goal 1: Patient will state decreased pain 2/10 with movement  Short Term Goal 2: Increase AROM by 20 degrees Flex and Abd from supine position Short Term Goal 3: Patient will be able to dress himself using BUE's without pain Short Term Goal 4: Patient  will be independent with HEP for Stretching and light strengthening. Short Term Goal 5: [name] will increase strenght to 4/5 from 3/5 Long Term Goals Time to Complete Long Term Goals: 8 weeks Long Term Goal 1: [name] will be able to wash and comb his hair with the left UE. Long Term Goal 2: [name] will be able to demonstrate 5/5 strength in Flex and Abd and ER of Left shoulder. Long Term Goal 3: Patient will be able to reach forward and to both sides without gaurding. Long Term Goal 4: Patient will be able to continue independent with strengthening program at home Long Term Goal 5: Patient will return to full functional mobility and use of the left UE  Problem List Patient Active Problem List  Diagnoses  . Hypertension  . Cerebrovascular disease  . Atrial fibrillation  . Hyperlipidemia  . History of noncompliance with medical treatment  . Aortic stenosis  . Diabetes mellitus  . Dyspnea on exertion  . BPH (benign prostatic hypertrophy) with urinary obstruction    End of Session Activity Tolerance: Patient tolerated treatment well General Behavior During Session: [facility] for tasks performed Cognition: [facility] for tasks performed  GO No functional reporting required   Garold Sheeler L. Noralee Stain, COTA/L  01/11/2012, 5:33 PM

## 2012-01-12 ENCOUNTER — Telehealth (HOSPITAL_COMMUNITY): Payer: Self-pay | Admitting: Physical Therapy

## 2012-01-12 ENCOUNTER — Inpatient Hospital Stay (HOSPITAL_COMMUNITY): Admission: RE | Admit: 2012-01-12 | Payer: Medicare Other | Source: Ambulatory Visit | Admitting: Occupational Therapy

## 2012-01-13 DIAGNOSIS — I4891 Unspecified atrial fibrillation: Secondary | ICD-10-CM | POA: Diagnosis not present

## 2012-01-16 ENCOUNTER — Encounter (HOSPITAL_COMMUNITY): Payer: Self-pay | Admitting: Pharmacy Technician

## 2012-01-16 ENCOUNTER — Ambulatory Visit (HOSPITAL_COMMUNITY)
Admission: RE | Admit: 2012-01-16 | Discharge: 2012-01-16 | Disposition: A | Discharge status: Home / Self Care | Payer: Medicare Other | Source: Ambulatory Visit | Attending: Family Medicine | Admitting: Family Medicine

## 2012-01-16 DIAGNOSIS — I10 Essential (primary) hypertension: Secondary | ICD-10-CM | POA: Diagnosis not present

## 2012-01-16 DIAGNOSIS — M25519 Pain in unspecified shoulder: Secondary | ICD-10-CM | POA: Diagnosis not present

## 2012-01-16 DIAGNOSIS — E119 Type 2 diabetes mellitus without complications: Secondary | ICD-10-CM | POA: Diagnosis not present

## 2012-01-16 DIAGNOSIS — M6281 Muscle weakness (generalized): Secondary | ICD-10-CM | POA: Diagnosis not present

## 2012-01-16 DIAGNOSIS — IMO0001 Reserved for inherently not codable concepts without codable children: Secondary | ICD-10-CM | POA: Insufficient documentation

## 2012-01-16 DIAGNOSIS — M25619 Stiffness of unspecified shoulder, not elsewhere classified: Secondary | ICD-10-CM | POA: Diagnosis not present

## 2012-01-16 DIAGNOSIS — E785 Hyperlipidemia, unspecified: Secondary | ICD-10-CM | POA: Insufficient documentation

## 2012-01-16 NOTE — Progress Notes (Signed)
Occupational Therapy Treatment  Patient Details  Name: [name] [name] MRN: [id number] Date of Birth: June 26, 1932  Today's Date: 01/16/2012 Time: 7829-5621 Time Calculation (min): 44 min  Visit#: 6  of 18   Re-eval: 01/23/12 Manual Therapy 209-231 22' Therapeutic Exercise 232-253     Subjective Symptoms/Limitations Symptoms: S:  I have been on the go today.  It hurts about the same but I can use it better. Pain Assessment Currently in Pain?: Yes Pain Score:   5 Pain Location: Shoulder Pain Orientation: Left  Precautions/Restrictions     Exercise/Treatments Supine Protraction: PROM;AROM;10 reps Horizontal ABduction: PROM;AROM;10 reps External Rotation: PROM;AROM;10 reps Internal Rotation: PROM;AROM;10 reps Flexion: PROM;AROM;10 reps ABduction: PROM;AROM;10 reps Seated Elevation: Theraband;10 reps Theraband Level (Shoulder Elevation): Level 3 (Green) Extension: Theraband;10 reps Theraband Level (Shoulder Extension): Level 3 (Green) Retraction: Theraband;10 reps Theraband Level (Shoulder Retraction): Level 3 (Green) Row: Theraband;10 reps Theraband Level (Shoulder Row): Level 3 (Green) External Rotation: Theraband;10 reps Theraband Level (Shoulder External Rotation): Level 3 (Green) Internal Rotation: Theraband;10 reps Theraband Level (Shoulder Internal Rotation): Level 3 (Green) Therapy Ball Flexion: 20 reps ABduction: 20 reps Right/Left: 5 reps ROM / Strengthening / Isometric Strengthening UBE (Upper Arm Bike): 3' forward 3' backward 2.0 Wall Wash: 4'      Manual Therapy Manual Therapy: Myofascial release Myofascial Release: MFR and manual stretching to left UE to provide realease from restrictions to decrease pain and increase A/PROM so patient can return to pain free ADL's  Occupational Therapy Assessment and Plan OT Assessment and Plan Clinical Impression Statement: A: Increased time with wall wash and added ball circles.  Also, added tband for scapular  strengthening. Rehab Potential: Good OT Plan: P:  Attempt AROM in seated.   Goals Home Exercise Program Pt will Perform Home Exercise Program: Independently Short Term Goals Time to Complete Short Term Goals: 3 weeks Short Term Goal 1: Patient will state decreased pain 2/10 with movement  Short Term Goal 2: Increase AROM by 20 degrees Flex and Abd from supine position Short Term Goal 3: Patient will be able to dress himself using BUE's without pain Short Term Goal 4: Patient will be independent with HEP for Stretching and light strengthening. Short Term Goal 5: [name] will increase strenght to 4/5 from 3/5 Long Term Goals Time to Complete Long Term Goals: 8 weeks Long Term Goal 1: [name] will be able to wash and comb his hair with the left UE. Long Term Goal 2: [name] will be able to demonstrate 5/5 strength in Flex and Abd and ER of Left shoulder. Long Term Goal 3: Patient will be able to reach forward and to both sides without gaurding. Long Term Goal 4: Patient will be able to continue independent with strengthening program at home Long Term Goal 5: Patient will return to full functional mobility and use of the left UE  Problem List Patient Active Problem List  Diagnoses  . Hypertension  . Cerebrovascular disease  . Atrial fibrillation  . Hyperlipidemia  . History of noncompliance with medical treatment  . Aortic stenosis  . Diabetes mellitus  . Dyspnea on exertion  . BPH (benign prostatic hypertrophy) with urinary obstruction    End of Session Activity Tolerance: Patient tolerated treatment well General Behavior During Session: [facility] for tasks performed Cognition: [facility] for tasks performed  GO No functional reporting required   Zandon Talton L. Camilia Caywood, COTA/L  01/16/2012, 3:17 PM

## 2012-01-18 ENCOUNTER — Ambulatory Visit (HOSPITAL_COMMUNITY): Payer: Medicare Other | Admitting: Occupational Therapy

## 2012-01-18 MED ORDER — SODIUM CHLORIDE 0.45 % IV SOLN
Freq: Once | INTRAVENOUS | Status: AC
  Administered 2012-01-19: 10:00:00 via INTRAVENOUS

## 2012-01-19 ENCOUNTER — Ambulatory Visit (HOSPITAL_COMMUNITY)
Admission: RE | Admit: 2012-01-19 | Discharge: 2012-01-19 | Disposition: A | Discharge status: Home Health | Payer: Medicare Other | Source: Ambulatory Visit | Attending: Internal Medicine | Admitting: Internal Medicine

## 2012-01-19 ENCOUNTER — Encounter (HOSPITAL_COMMUNITY): Payer: Self-pay | Admitting: *Deleted

## 2012-01-19 ENCOUNTER — Encounter (HOSPITAL_COMMUNITY)
Admission: RE | Disposition: A | Discharge status: Home Health | Payer: Self-pay | Source: Ambulatory Visit | Attending: Internal Medicine

## 2012-01-19 DIAGNOSIS — Z79899 Other long term (current) drug therapy: Secondary | ICD-10-CM | POA: Diagnosis not present

## 2012-01-19 DIAGNOSIS — Z7901 Long term (current) use of anticoagulants: Secondary | ICD-10-CM | POA: Insufficient documentation

## 2012-01-19 DIAGNOSIS — Z1211 Encounter for screening for malignant neoplasm of colon: Secondary | ICD-10-CM | POA: Insufficient documentation

## 2012-01-19 DIAGNOSIS — D126 Benign neoplasm of colon, unspecified: Secondary | ICD-10-CM | POA: Insufficient documentation

## 2012-01-19 DIAGNOSIS — E119 Type 2 diabetes mellitus without complications: Secondary | ICD-10-CM | POA: Insufficient documentation

## 2012-01-19 DIAGNOSIS — E785 Hyperlipidemia, unspecified: Secondary | ICD-10-CM | POA: Diagnosis not present

## 2012-01-19 DIAGNOSIS — I10 Essential (primary) hypertension: Secondary | ICD-10-CM | POA: Diagnosis not present

## 2012-01-19 DIAGNOSIS — Z01812 Encounter for preprocedural laboratory examination: Secondary | ICD-10-CM | POA: Insufficient documentation

## 2012-01-19 DIAGNOSIS — K5730 Diverticulosis of large intestine without perforation or abscess without bleeding: Secondary | ICD-10-CM

## 2012-01-19 HISTORY — PX: COLONOSCOPY: SHX5424

## 2012-01-19 SURGERY — COLONOSCOPY
Anesthesia: Moderate Sedation

## 2012-01-19 MED ORDER — MEPERIDINE HCL 50 MG/ML IJ SOLN
INTRAMUSCULAR | Status: AC
  Filled 2012-01-19: qty 1

## 2012-01-19 MED ORDER — STERILE WATER FOR IRRIGATION IR SOLN
Status: DC | PRN
  Administered 2012-01-19: 10:00:00

## 2012-01-19 MED ORDER — MEPERIDINE HCL 50 MG/ML IJ SOLN
INTRAMUSCULAR | Status: DC | PRN
  Administered 2012-01-19 (×2): 25 mg via INTRAVENOUS

## 2012-01-19 MED ORDER — MIDAZOLAM HCL 5 MG/5ML IJ SOLN
INTRAMUSCULAR | Status: AC
  Filled 2012-01-19: qty 10

## 2012-01-19 MED ORDER — MIDAZOLAM HCL 5 MG/5ML IJ SOLN
INTRAMUSCULAR | Status: DC | PRN
  Administered 2012-01-19: 1 mg via INTRAVENOUS
  Administered 2012-01-19: 2 mg via INTRAVENOUS

## 2012-01-19 NOTE — H&P (Signed)
[name] [name] is an 78 y.o. male.   Chief Complaint: Patient is here for colonoscopy. HPI: Patient is a 78 year old Caucasian male who is in for average risk screening colonoscopy. His last examination was over 10 years ago. He denies abdominal pain change in his bowel habits or rectal bleeding. Family history is negative for colorectal carcinoma. His warfarin is on hold.  Past Medical History  Diagnosis Date  . Hypertension     Borderline  . Cerebrovascular disease 07/2010    TIA; carotid ultrasound in 07/2010-significant bilateral plaque without focal internal carotid artery stenosis; MRI -encephalomalacia left temporal and right temporal lobes; small inferior right cerebellar infarct; small vessel disease  . Hyperlipidemia     adverse reactions to statins and niacin  . History of noncompliance with medical treatment   . Aortic stenosis 2007    Very mild  . Hepatic steatosis   . Diabetes mellitus     no insulin; A1c of 6.6 in 2005  . Atrial fibrillation     Paroxysmal; Echocardiogram in 2007-normal EF; mild LVH; left atrial enlargement; mild stenosis and minimal AI; negative stress nuclear study in 2008  . Exertional dyspnea   . Degenerative joint disease     feet and legs  . Dizziness     occurs daily,especially in am  . Gastroesophageal reflux disease     Past Surgical History  Procedure Date  . Rotator cuff repair     Right  . Lipoma excision 1980  . Urethral stricture dilatation 1980s  . Orif ankle fracture 2000    Right  . Colonoscopy 2002  . Transurethral resection of prostate 09/2011    Family History  Problem Relation Age of Onset  . Stroke Mother   . Diabetes Father   . Colon cancer Neg Hx    Social History:  reports that he quit smoking about 14 years ago. His smoking use included Cigarettes. He has a 20 pack-year smoking history. He quit smokeless tobacco use about 14 years ago. He reports that he does not drink alcohol or use illicit drugs.  Allergies:    Allergies  Allergen Reactions  . Lipitor (Atorvastatin Calcium) Other (See Comments)    myalgias  . Ranitidine Other (See Comments)    Chest discomfort  . Seasonal   . Simvastatin Other (See Comments)    Myalgias    Medications Prior to Admission  Medication Dose Route Frequency Provider Last Rate Last Dose  . 0.45 % sodium chloride infusion   Intravenous Once [name] Hippo, MD 20 mL/hr at 01/19/12 0954    . meperidine (DEMEROL) 50 MG/ML injection           . midazolam (VERSED) 5 MG/5ML injection            Medications Prior to Admission  Medication Sig Dispense Refill  . glyBURIDE micronized (GLYNASE) 3 MG tablet Take 3 mg by mouth daily with breakfast.       . HYDROcodone-acetaminophen (NORCO) 5-325 MG per tablet Take 1 tablet by mouth every 6 (six) hours as needed. pain      . metoprolol succinate (TOPROL-XL) 25 MG 24 hr tablet Take 25 mg by mouth every morning.       . zolpidem (AMBIEN) 10 MG tablet Take 10 mg by mouth at bedtime as needed. For sleep      . citalopram (CELEXA) 20 MG tablet Take 20 mg by mouth daily.       . fish oil-omega-3 fatty acids 1000 MG  capsule Take 1 capsule (1 g total) by mouth 2 (two) times daily.  30 capsule  0  . fluticasone (FLONASE) 50 MCG/ACT nasal spray Place 1 spray into the nose daily as needed. Allergies       . traZODone (DESYREL) 100 MG tablet Take 100 mg by mouth at bedtime.       [name] warfarin (COUMADIN) 5 MG tablet Take 5 mg by mouth daily.         Results for orders placed during the hospital encounter of 01/19/12 (from the past 48 hour(s))  GLUCOSE, CAPILLARY     Status: Abnormal   Collection Time   01/19/12  9:51 AM      Component Value Range Comment   Glucose-Capillary 169 (*) 70 - 99 (mg/dL)    No results found.  Review of Systems  Constitutional: Negative for weight loss.  Gastrointestinal: Negative for abdominal pain, diarrhea, constipation, blood in stool and melena.    Blood pressure 133/82, pulse 88, temperature 97.5  F (36.4 C), temperature source Oral, resp. rate 24, height 6\' 2"  (1.88 m), weight 253 lb (114.76 kg), SpO2 100.00%. Physical Exam  Constitutional: He appears well-developed and well-nourished.  HENT:  Mouth/Throat: Oropharynx is clear and moist.  Eyes: Conjunctivae are normal. No scleral icterus.  Neck: No thyromegaly present.  Cardiovascular: Normal rate and normal heart sounds.   No murmur heard.      Irregular rhythm  Respiratory: Effort normal and breath sounds normal.  GI: Soft. He exhibits no mass. There is no tenderness.       Small umbilical hernia  Musculoskeletal: He exhibits no edema.  Lymphadenopathy:    He has no cervical adenopathy.  Neurological: He is alert.  Skin: Skin is warm and dry.     Assessment/Plan Average risk screening colonoscopy.  [name] U 01/19/2012, 10:13 AM

## 2012-01-19 NOTE — Discharge Instructions (Signed)
Resume warfarin starting tomorrow evening. Resume medications as before. High fiber diet. No driving for [id number]-XWRUE. Physician will contact you with the biopsy results.  Colon Polyps A polyp is extra tissue that grows inside your body. Colon polyps grow in the large intestine. The large intestine, also called the colon, is part of your digestive system. It is a long, hollow tube at the end of your digestive tract where your body makes and stores stool. Most polyps are not dangerous. They are benign. This means they are not cancerous. But over time, some types of polyps can turn into cancer. Polyps that are smaller than a pea are usually not harmful. But larger polyps could someday become or may already be cancerous. To be safe, doctors remove all polyps and test them.  WHO GETS POLYPS? Anyone can get polyps, but certain people are more likely than others. You may have a greater chance of getting polyps if:  You are over 50.   You have had polyps before.   Someone in your family has had polyps.   Someone in your family has had cancer of the large intestine.   Find out if someone in your family has had polyps. You may also be more likely to get polyps if you:   Eat a lot of fatty foods.   Smoke.   Drink alcohol.   Do not exercise.   Eat too much.  SYMPTOMS  Most small polyps do not cause symptoms. People often do not know they have one until their caregiver finds it during a regular checkup or while testing them for something else. Some people do have symptoms like these:  Bleeding from the anus. You might notice blood on your underwear or on toilet paper after you have had a bowel movement.   Constipation or diarrhea that lasts more than a week.   Blood in the stool. Blood can make stool look black or it can show up as red streaks in the stool.  If you have any of these symptoms, see your caregiver. HOW DOES THE DOCTOR TEST FOR POLYPS? The doctor can use four tests to check for  polyps:  Digital rectal exam. The caregiver wears gloves and checks your rectum (the last part of the large intestine) to see if it feels normal. This test would find polyps only in the rectum. Your caregiver may need to do one of the other tests listed below to find polyps higher up in the intestine.   Barium enema. The caregiver puts a liquid called barium into your rectum before taking x-rays of your large intestine. Barium makes your intestine look white in the pictures. Polyps are dark, so they are easy to see.   Sigmoidoscopy. With this test, the caregiver can see inside your large intestine. A thin flexible tube is placed into your rectum. The device is called a sigmoidoscope, which has a light and a tiny video camera in it. The caregiver uses the sigmoidoscope to look at the last third of your large intestine.   Colonoscopy. This test is like sigmoidoscopy, but the caregiver looks at all of the large intestine. It usually requires sedation. This is the most common method for finding and removing polyps.  TREATMENT   The caregiver will remove the polyp during sigmoidoscopy or colonoscopy. The polyp is then tested for cancer.   If you have had polyps, your caregiver may want you to get tested regularly in the future.  PREVENTION  There is not one sure way  to prevent polyps. You might be able to lower your risk of getting them if you:  Eat more fruits and vegetables and less fatty food.   Do not smoke.   Avoid alcohol.   Exercise every day.   Lose weight if you are overweight.   Eating more calcium and folate can also lower your risk of getting polyps. Some foods that are rich in calcium are milk, cheese, and broccoli. Some foods that are rich in folate are chickpeas, kidney beans, and spinach.   Aspirin might help prevent polyps. Studies are under way.  Document Released: 07/27/2004 Document Revised: 10/20/2011 Document Reviewed: 01/02/2008 [facility] Patient Information 2012  [place], [place].  Colonoscopy Care After These instructions give you information on caring for yourself after your procedure. Your doctor may also give you more specific instructions. Call your doctor if you have any problems or questions after your procedure. HOME CARE  Take it easy for the next 24 hours.   Rest.   Walk or use warm packs on your belly (abdomen) if you have belly cramping or gas.   Do not drive for 24 hours.   You may shower.   Do not sign important papers or use machinery for 24 hours.   Drink enough fluids to keep your pee (urine) clear or pale yellow.   Resume your normal diet. Avoid heavy or fried foods.   Avoid alcohol.   Continue taking your normal medicines.   Only take medicine as told by your doctor. Do not take aspirin.  If you had growths (polyps) removed:  Do not take aspirin.   Do not drink alcohol for 7 days or as told by your doctor.   Eat a soft diet for 24 hours.  GET HELP RIGHT AWAY IF:  You have a fever.   You pass clumps of tissue (blood clots) or fill the toilet with blood.   You have belly pain that gets worse and medicine does not help.   Your belly is puffy (swollen).   You feel sick to your stomach (nauseous) or throw up (vomit).  MAKE SURE YOU:  Understand these instructions.   Will watch your condition.   Will get help right away if you are not doing well or get worse.  Document Released: 12/03/2010 Document Revised: 10/20/2011 Document Reviewed: 12/03/2010 [facility] Patient Information 2012 [place], [place].  High Fiber Diet A high fiber diet changes your normal diet to include more whole grains, legumes, fruits, and vegetables. Changes in the diet involve replacing refined carbohydrates with unrefined foods. The calorie level of the diet is essentially unchanged. The Dietary Reference Intake (recommended amount) for adult males is 38 g per day. For adult females, it is 25 g per day. Pregnant and lactating women should  consume 28 g of fiber per day. Fiber is the intact part of a plant that is not broken down during digestion. Functional fiber is fiber that has been isolated from the plant to provide a beneficial effect in the body. PURPOSE  Increase stool bulk.   Ease and regulate bowel movements.   Lower cholesterol.  INDICATIONS THAT YOU NEED MORE FIBER  Constipation and hemorrhoids.   Uncomplicated diverticulosis (intestine condition) and irritable bowel syndrome.   Weight management.   As a protective measure against hardening of the arteries (atherosclerosis), diabetes, and cancer.  NOTE OF CAUTION If you have a digestive or bowel problem, ask your caregiver for advice before adding high fiber foods to your diet. Some of the following  medical problems are such that a high fiber diet should not be used without consulting your caregiver:  Acute diverticulitis (intestine infection).   Partial small bowel obstructions.   Complicated diverticular disease involving bleeding, rupture (perforation), or abscess (boil, furuncle).   Presence of autonomic neuropathy (nerve damage) or gastric paresis (stomach cannot empty itself).  GUIDELINES FOR INCREASING FIBER  Start adding fiber to the diet slowly. A gradual increase of about 5 more grams (2 slices of whole-wheat bread, 2 servings of most fruits or vegetables, or 1 bowl of high fiber cereal) per day is best. Too rapid an increase in fiber may result in constipation, flatulence, and bloating.   Drink enough water and fluids to keep your urine clear or pale yellow. Water, juice, or caffeine-free drinks are recommended. Not drinking enough fluid may cause constipation.   Eat a variety of high fiber foods rather than one type of fiber.   Try to increase your intake of fiber through using high fiber foods rather than fiber pills or supplements that contain small amounts of fiber.   The goal is to change the types of food eaten. Do not supplement your  present diet with high fiber foods, but replace foods in your present diet.  INCLUDE A VARIETY OF FIBER SOURCES  Replace refined and processed grains with whole grains, canned fruits with fresh fruits, and incorporate other fiber sources. White rice, white breads, and most bakery goods contain little or no fiber.   Brown whole-grain rice, buckwheat oats, and many fruits and vegetables are all good sources of fiber. These include: broccoli, Brussels sprouts, cabbage, cauliflower, beets, sweet potatoes, white potatoes (skin on), carrots, tomatoes, eggplant, squash, berries, fresh fruits, and dried fruits.   Cereals appear to be the richest source of fiber. Cereal fiber is found in whole grains and bran. Bran is the fiber-rich outer coat of cereal grain, which is largely removed in refining. In whole-grain cereals, the bran remains. In breakfast cereals, the largest amount of fiber is found in those with "bran" in their names. The fiber content is sometimes indicated on the label.   You may need to include additional fruits and vegetables each day.   In baking, for 1 cup white flour, you may use the following substitutions:   1 cup whole-wheat flour minus 2 tbs.    cup white flour plus  cup whole-wheat flour.  Document Released: 10/31/2005 Document Revised: 10/20/2011 Document Reviewed: 09/08/2009 [facility] Patient Information 2012 [place], [place].

## 2012-01-19 NOTE — Op Note (Signed)
COLONOSCOPY PROCEDURE REPORT  PATIENT:  [name]  MR#:  [id number] Birthdate:  04-16-1932, 78 y.o., male Endoscopist:  Dr. Malissa Hippo, MD Referred By:  Dr. Lilyan Punt, MD Procedure Date: 01/19/2012  Procedure:   Colonoscopy with snare polypectomy.  Indications:  Patient is a 78 year old Caucasian male who is undergoing average risk screening colonoscopy.  Informed Consent:  Procedure and risks reviewed with the patient and informed consent obtained.  Medications:  Demerol 50 mg IV Versed 3 mg IV  Description of procedure:  After a digital rectal exam was performed, that colonoscope was advanced from the anus through the rectum and colon to the area of the cecum, ileocecal valve and appendiceal orifice. The cecum was deeply intubated. These structures were well-seen and photographed for the record. From the level of the cecum and ileocecal valve, the scope was slowly and cautiously withdrawn. The mucosal surfaces were carefully surveyed utilizing scope tip to flexion to facilitate fold flattening as needed. The scope was pulled down into the rectum where a thorough exam including retroflexion was performed.  Findings:   Prep fair to satisfactory. 7 mm polyp snared from proximal transverse colon. Small polyp ablated via cold biopsy from splenic flexure. Another 6-7 mm sessile polyp snared from descending colon. Another 8 mm polyp snared from sigmoid colon. 3 small polyps were coagulated. 2 of these are located at descending colon and the third one was at rectum just above the dentate line. Scattered diverticula throughout the colon.  Therapeutic/Diagnostic Maneuvers Performed:  See above  Complications:  None  Cecal Withdrawal Time:  27 minutes  Impression:  Examination performed to cecum. Pancolonic diverticulosis. Three sessile polyps 6-8 mm in diameter snared. These are located at proximal transverse , descending  and sigmoid colon. Three small polyps were coagulated  using snare tip as above. Small polyp ablated via cold biopsy from splenic flexure. All in all he had 7 polyps  Recommendations:  Standard instructions given. Resume warfarin starting tomorrow evening. I will be contacting patient with results of biopsy and further recommendations  Zalma Channing U  01/19/2012 10:59 AM  CC: Dr. Lilyan Punt, MD, MD & Dr. Bonnetta Barry ref. provider found

## 2012-01-20 ENCOUNTER — Ambulatory Visit (HOSPITAL_COMMUNITY)
Admission: RE | Admit: 2012-01-20 | Discharge: 2012-01-20 | Disposition: A | Discharge status: Home / Self Care | Payer: Medicare Other | Source: Ambulatory Visit | Attending: Family Medicine | Admitting: Family Medicine

## 2012-01-20 NOTE — Progress Notes (Signed)
Occupational Therapy Treatment  Patient Details  Name: [name] [name] MRN: [id number] Date of Birth: [birth year]  Today's Date: 01/20/2012 Time: 1308-6578 Time Calculation (min): 42 min  Visit#: 7  of 18   Re-eval: 01/23/12 Manual Therapy 154-209 15' Therapeutic Exercise 210-236 26'    Subjective Symptoms/Limitations Symptoms: S:  It feels about the same, I need to  move around more. Pain Assessment Currently in Pain?: Yes Pain Score:   5 Pain Location: Shoulder  Precautions/Restrictions     Exercise/Treatments Supine   Seated Extension: Theraband;12 reps Theraband Level (Shoulder Extension): Level 3 (Green) Retraction: Theraband;12 reps Theraband Level (Shoulder Retraction): Level 3 (Green) Row: Theraband;12 reps Theraband Level (Shoulder Row): Level 3 (Green) External Rotation: Theraband Theraband Level (Shoulder External Rotation): Level 3 (Green) Internal Rotation: Theraband;12 reps Theraband Level (Shoulder Internal Rotation): Level 3 (Green) Therapy Ball Flexion: 20 reps ABduction: 20 reps Right/Left: 5 reps ROM / Strengthening / Isometric Strengthening UBE (Upper Arm Bike): 3' forward 3' backward 2.0 Wall Wash: 3' with 1# Thumb Tacks: 1'        Manual Therapy Myofascial Release: MFR and manual stretching to left UE to provide release from restrictios to ecrease pain and increase A/PROM so patient can return to pain free ADL's  Occupational Therapy Assessment and Plan OT Assessment and Plan Clinical Impression Statement: A:  Added 1# to wall wash. Rehab Potential: Good OT Plan: P:  Reassess next week.   Goals Home Exercise Program Pt will Perform Home Exercise Program: Independently Short Term Goals Time to Complete Short Term Goals: 3 weeks Short Term Goal 1: Patient will state decreased pain 2/10 with movement  Short Term Goal 2: Increase AROM by 20 degrees Flex and Abd from supine position Short Term Goal 3: Patient will be able to dress  himself using BUE's without pain Short Term Goal 4: Patient will be independent with HEP for Stretching and light strengthening. Short Term Goal 5: [name] will increase strenght to 4/5 from 3/5 Long Term Goals Time to Complete Long Term Goals: 8 weeks Long Term Goal 1: [name] will be able to wash and comb his hair with the left UE. Long Term Goal 2: [name] will be able to demonstrate 5/5 strength in Flex and Abd and ER of Left shoulder. Long Term Goal 3: Patient will be able to reach forward and to both sides without gaurding. Long Term Goal 4: Patient will be able to continue independent with strengthening program at home Long Term Goal 5: Patient will return to full functional mobility and use of the left UE  Problem List Patient Active Problem List  Diagnoses  . Hypertension  . Cerebrovascular disease  . Atrial fibrillation  . Hyperlipidemia  . History of noncompliance with medical treatment  . Aortic stenosis  . Diabetes mellitus  . Dyspnea on exertion  . BPH (benign prostatic hypertrophy) with urinary obstruction    End of Session Activity Tolerance: Patient tolerated treatment well General Behavior During Session: [name] for tasks performed Cognition: [name] for tasks performed  GO   No functional reporting required  [name] [name] L. Noralee Stain, COTA/L  01/20/2012, 5:32 PM

## 2012-01-23 ENCOUNTER — Encounter (HOSPITAL_COMMUNITY): Payer: Self-pay | Admitting: Internal Medicine

## 2012-01-23 ENCOUNTER — Ambulatory Visit (HOSPITAL_COMMUNITY)
Admission: RE | Admit: 2012-01-23 | Discharge: 2012-01-23 | Disposition: A | Discharge status: Home / Self Care | Payer: Medicare Other | Source: Ambulatory Visit | Attending: Family Medicine | Admitting: Family Medicine

## 2012-01-23 NOTE — Progress Notes (Signed)
Occupational Therapy Treatment  Patient Details  Name: [name] [name] MRN: [id number] Date of Birth: [birth year]  Today's Date: 01/23/2012 Time: 4098-1191 Time Calculation (min): 53 min  Visit#: 8  of 30   Re-eval: 02/20/12 Manual Therapy 150-211 21' Therapeutic Exercise 212-243 31'  Subjective Symptoms/Limitations Symptoms: S:  It is getting a little better. Pain Assessment Currently in Pain?: Yes Pain Score:   4 Pain Location: Shoulder Pain Orientation: Left Pain Type: Acute pain  Precautions/Restrictions     Exercise/Treatments Supine Protraction: PROM;10 reps;AROM;12 reps Horizontal ABduction: PROM;10 reps;AROM;12 reps External Rotation: PROM;10 reps;AROM;12 reps Internal Rotation: PROM;10 reps;AROM;12 reps Flexion: PROM;10 reps;AROM;12 reps ABduction: PROM;10 reps;AROM;12 reps Seated Extension: Theraband;12 reps Theraband Level (Shoulder Extension): Level 3 (Green) Retraction: Theraband;12 reps Theraband Level (Shoulder Retraction): Level 3 (Green) Row: Theraband;12 reps Theraband Level (Shoulder Row): Level 3 (Green) External Rotation: Theraband Theraband Level (Shoulder External Rotation): Level 3 (Green) Internal Rotation: Theraband;12 reps Theraband Level (Shoulder Internal Rotation): Level 3 (Green) Therapy Ball Flexion: 20 reps ABduction: 20 reps Right/Left: 5 reps ROM / Strengthening / Isometric Strengthening UBE (Upper Arm Bike): 3' forward 3' backward 2.5 Wall Wash: 4' with 1#      Manual Therapy Manual Therapy: Myofascial release Myofascial Release: MFR and manual stretching to left UE to provide realease from restrictions to decrease pain and increase A/PROM so patient can return to pain free ADL's  Occupational Therapy Assessment and Plan OT Assessment and Plan Clinical Impression Statement: A:  See progress note. Rehab Potential: Good OT Plan: P:  Continue 3x a week for 4 weeks.   Goals Home Exercise Program Pt will Perform Home  Exercise Program: Independently Short Term Goals Time to Complete Short Term Goals: 3 weeks Short Term Goal 1: Patient will state decreased pain 2/10 with movement  Short Term Goal 2: Increase AROM by 20 degrees Flex and Abd from supine position Short Term Goal 3: Patient will be able to dress himself using BUE's without pain Short Term Goal 4: Patient will be independent with HEP for Stretching and light strengthening. Short Term Goal 5: [name] will increase strenght to 4/5 from 3/5 Long Term Goals Time to Complete Long Term Goals: 8 weeks Long Term Goal 1: [name] will be able to wash and comb his hair with the left UE. Long Term Goal 2: [name] will be able to demonstrate 5/5 strength in Flex and Abd and ER of Left shoulder. Long Term Goal 3: Patient will be able to reach forward and to both sides without gaurding. Long Term Goal 4: Patient will be able to continue independent with strengthening program at home Long Term Goal 5: Patient will return to full functional mobility and use of the left UE  Problem List Patient Active Problem List  Diagnoses  . Hypertension  . Cerebrovascular disease  . Atrial fibrillation  . Hyperlipidemia  . History of noncompliance with medical treatment  . Aortic stenosis  . Diabetes mellitus  . Dyspnea on exertion  . BPH (benign prostatic hypertrophy) with urinary obstruction    End of Session Activity Tolerance: Patient tolerated treatment well General Behavior During Session: [facility] for tasks performed Cognition: [facility] for tasks performed  GO No functional reporting required   Krisy Dix L. Noralee Stain, COTA/L  01/23/2012, 2:39 PM

## 2012-01-24 ENCOUNTER — Encounter (INDEPENDENT_AMBULATORY_CARE_PROVIDER_SITE_OTHER): Payer: Self-pay | Admitting: *Deleted

## 2012-01-25 ENCOUNTER — Ambulatory Visit (HOSPITAL_COMMUNITY)
Admission: RE | Admit: 2012-01-25 | Discharge: 2012-01-25 | Disposition: A | Discharge status: Home / Self Care | Payer: Medicare Other | Source: Ambulatory Visit | Attending: Family Medicine | Admitting: Family Medicine

## 2012-01-25 NOTE — Progress Notes (Signed)
Occupational Therapy Treatment  Patient Details  Name: [name] [name] MRN: [id number] Date of Birth: [birth year]  Today's Date: 01/25/2012 Time: 7829-5621 Time Calculation (min): 28 min  Visit#: 9  of 30   Re-eval: 02/20/12 Manual therapy [id number] 17' Therapeutic Exercise [id number] 10'    Subjective Symptoms/Limitations Symptoms: S:  I did some work around the house the other day and now I am really sore. Pain Assessment Currently in Pain?: Yes Pain Score:   7 Pain Location: Shoulder Pain Orientation: Left  Precautions/Restrictions     Exercise/Treatments Supine Protraction: PROM;10 reps Horizontal ABduction: PROM;10 reps External Rotation: PROM;10 reps Internal Rotation: PROM;10 reps Flexion: PROM;10 reps ABduction: PROM;10 reps       Modalities Modalities: Moist Heat Manual Therapy Myofascial Release: MFR and manual stretching to left UE to provide realease from restrictions to decrease pain and increase A/PROM so patient can return to pain free ADL's Moist Heat Therapy Number Minutes Moist Heat: 10 Minutes Moist Heat Location: Shoulder  Occupational Therapy Assessment and Plan OT Assessment and Plan Clinical Impression Statement: A:  Patient with increased pain, requested manual and stretching.  Patient also received MH;  Patients pain decreased to 3/10 after treatment. Rehab Potential: Good OT Plan: P:  Resume ex.   Goals Home Exercise Program Pt will Perform Home Exercise Program: Independently Short Term Goals Time to Complete Short Term Goals: 3 weeks Short Term Goal 1: Patient will state decreased pain 2/10 with movement  Short Term Goal 2: Increase AROM by 20 degrees Flex and Abd from supine position Short Term Goal 3: Patient will be able to dress himself using BUE's without pain Short Term Goal 4: Patient will be independent with HEP for Stretching and light strengthening. Short Term Goal 5: [name] will increase strenght to 4/5 from 3/5 Long  Term Goals Time to Complete Long Term Goals: 8 weeks Long Term Goal 1: [name] will be able to wash and comb his hair with the left UE. Long Term Goal 2: [name] will be able to demonstrate 5/5 strength in Flex and Abd and ER of Left shoulder. Long Term Goal 3: Patient will be able to reach forward and to both sides without gaurding. Long Term Goal 4: Patient will be able to continue independent with strengthening program at home Long Term Goal 5: Patient will return to full functional mobility and use of the left UE  Problem List Patient Active Problem List  Diagnoses  . Hypertension  . Cerebrovascular disease  . Atrial fibrillation  . Hyperlipidemia  . History of noncompliance with medical treatment  . Aortic stenosis  . Diabetes mellitus  . Dyspnea on exertion  . BPH (benign prostatic hypertrophy) with urinary obstruction    End of Session Activity Tolerance: Patient tolerated treatment well General Behavior During Session: [facility] for tasks performed Cognition: [facility] for tasks performed  GO No functional reporting required  Raychelle Hudman L. Noralee Stain, COTA/L  01/25/2012, 5:21 PM

## 2012-01-27 ENCOUNTER — Ambulatory Visit (HOSPITAL_COMMUNITY)
Admission: RE | Admit: 2012-01-27 | Discharge: 2012-01-27 | Disposition: A | Discharge status: Home / Self Care | Payer: Medicare Other | Source: Ambulatory Visit | Attending: Family Medicine | Admitting: Family Medicine

## 2012-01-27 DIAGNOSIS — I4891 Unspecified atrial fibrillation: Secondary | ICD-10-CM | POA: Diagnosis not present

## 2012-01-27 NOTE — Progress Notes (Signed)
Occupational Therapy Treatment  Patient Details  Name: ALEXANDERJAMES BERG MRN: [id number] Date of Birth: 1932/09/17  Today's Date: 01/27/2012 Time: 4098-1191 Time Calculation (min): 37 min  Visit#: 10  of 30   Re-eval: 02/20/12 Therapeutic Exercise 154-220 26' Manual/PROM 221-231 10'    Subjective Symptoms/Limitations Symptoms: S:  I but a hot water heater in my rental hous\+ Pain Assessment Currently in Pain?: No/denies Pain Score: 0-No pain  Precautions/Restrictions     Exercise/Treatments Supine Protraction: PROM;AROM;10 reps Horizontal ABduction: PROM;AROM;10 reps External Rotation: PROM;AROM;10 reps Internal Rotation: PROM;AROM;10 reps Flexion: PROM;AROM;10 reps ABduction: PROM;AROM;10 reps Seated Extension: Theraband;12 reps Theraband Level (Shoulder Extension): Level 4 (Blue) Retraction: Theraband;12 reps Theraband Level (Shoulder Retraction): Level 4 (Blue) Row: Theraband;12 reps Theraband Level (Shoulder Row): Level 4 (Blue) Protraction: AROM;10 reps Horizontal ABduction: AROM;10 reps External Rotation: Theraband;12 reps;AROM;10 reps Theraband Level (Shoulder External Rotation): Level 4 (Blue) Internal Rotation: Theraband;AROM;10 reps Theraband Level (Shoulder Internal Rotation): Level 4 (Blue) Flexion: AROM;10 reps Abduction: AROM;10 reps Therapy Ball Flexion: 20 reps ABduction: 20 reps Right/Left: 5 reps ROM / Strengthening / Isometric Strengthening UBE (Upper Arm Bike): 3' forward 3' backward 3.0 Wall Wash: 4' with 1#         Manual Therapy Manual Therapy: Other (comment) Other Manual Therapy: PROM  Occupational Therapy Assessment and Plan OT Assessment and Plan Clinical Impression Statement: A:  Patient with no pain, full PROM. Increased band to blue added AROM seated and supine. OT Plan: P:  Increase reps and add 1# to supine if patient tolerates.   Goals Home Exercise Program Pt will Perform Home Exercise Program: Independently Short  Term Goals Time to Complete Short Term Goals: 3 weeks Short Term Goal 1: Patient will state decreased pain 2/10 with movement  Short Term Goal 2: Increase AROM by 20 degrees Flex and Abd from supine position Short Term Goal 3: Patient will be able to dress himself using BUE's without pain Short Term Goal 4: Patient will be independent with HEP for Stretching and light strengthening. Short Term Goal 5: [name] will increase strenght to 4/5 from 3/5 Long Term Goals Time to Complete Long Term Goals: 8 weeks Long Term Goal 1: [name] will be able to wash and comb his hair with the left UE. Long Term Goal 2: [name] will be able to demonstrate 5/5 strength in Flex and Abd and ER of Left shoulder. Long Term Goal 3: Patient will be able to reach forward and to both sides without gaurding. Long Term Goal 4: Patient will be able to continue independent with strengthening program at home Long Term Goal 5: Patient will return to full functional mobility and use of the left UE  Problem List Patient Active Problem List  Diagnoses  . Hypertension  . Cerebrovascular disease  . Atrial fibrillation  . Hyperlipidemia  . History of noncompliance with medical treatment  . Aortic stenosis  . Diabetes mellitus  . Dyspnea on exertion  . BPH (benign prostatic hypertrophy) with urinary obstruction    End of Session Activity Tolerance: Patient tolerated treatment well General Behavior During Session: [facility] for tasks performed Cognition: [facility] for tasks performed  GO No functional reporting required   Authur Cubit L. Owens Hara, COTA/L  01/27/2012, 2:59 PM

## 2012-01-30 ENCOUNTER — Ambulatory Visit (HOSPITAL_COMMUNITY)
Admission: RE | Admit: 2012-01-30 | Discharge: 2012-01-30 | Disposition: A | Discharge status: Home / Self Care | Payer: Medicare Other | Source: Ambulatory Visit | Attending: Family Medicine | Admitting: Family Medicine

## 2012-01-30 NOTE — Progress Notes (Signed)
Occupational Therapy Treatment  Patient Details  Name: [name] [name] MRN: [id number] Date of Birth: 14-Apr-1932  Today's Date: 01/30/2012 Time: 2956-2130 Time Calculation (min): 45 min  Visit#: 11  of 30   Re-eval: 02/20/12 Manual Therapy   205-214  9' Therapeutic Exercise 129-204 35'    Subjective Symptoms/Limitations Symptoms: S:  It is feeling a lot better.  Precautions/Restrictions     Exercise/Treatments Supine Protraction: PROM;Strengthening;10 reps Protraction Weight (lbs): 1# Horizontal ABduction: PROM;Strengthening;10 reps Horizontal ABduction Weight (lbs): 1# External Rotation: PROM;Strengthening;10 reps External Rotation Weight (lbs): 1# Internal Rotation: PROM;Strengthening;10 reps Internal Rotation Weight (lbs): 1# Flexion: PROM;Strengthening;10 reps Shoulder Flexion Weight (lbs): 1# ABduction: PROM;Strengthening;10 reps Shoulder ABduction Weight (lbs): 1# Seated Extension: Theraband;15 reps Theraband Level (Shoulder Extension): Level 4 (Blue) Retraction: Theraband;12 reps Theraband Level (Shoulder Retraction): Level 4 (Blue) Row: Theraband;12 reps Theraband Level (Shoulder Row): Level 4 (Blue) Protraction: AROM;12 reps Horizontal ABduction: AROM;12 reps External Rotation: Theraband;AROM;12 reps Theraband Level (Shoulder External Rotation): Level 4 (Blue) Internal Rotation: Theraband;AROM;12 reps Theraband Level (Shoulder Internal Rotation): Level 4 (Blue) Flexion: AROM;12 reps Abduction: AROM;12 reps Prone    Sidelying   Standing   Pulleys   Therapy Ball Flexion: 20 reps ABduction: 20 reps Right/Left: 5 reps ROM / Strengthening / Isometric Strengthening UBE (Upper Arm Bike): 3' forward 3' backward 3.0 Proximal Shoulder Strengthening, Supine: x20   Stretches   Power Press photographer Therapy Manual Therapy: Myofascial release Myofascial Release: MFR and manual stretching to left UE to provide realease  from restrictions to decrease pain and increase A/PROM so patient can return to pain free ADL's  Occupational Therapy Assessment and Plan OT Assessment and Plan Clinical Impression Statement: A:  Added 1# and proximal shoulder strengthening to supine ex. Rehab Potential: Good OT Plan: P:  Add 1# to seated.   Goals Home Exercise Program Pt will Perform Home Exercise Program: Independently Short Term Goals Time to Complete Short Term Goals: 3 weeks Short Term Goal 1: Patient will state decreased pain 2/10 with movement  Short Term Goal 2: Increase AROM by 20 degrees Flex and Abd from supine position Short Term Goal 3: Patient will be able to dress himself using BUE's without pain Short Term Goal 4: Patient will be independent with HEP for Stretching and light strengthening. Short Term Goal 5: [name] will increase strenght to 4/5 from 3/5 Long Term Goals Time to Complete Long Term Goals: 8 weeks Long Term Goal 1: [name] will be able to wash and comb his hair with the left UE. Long Term Goal 2: [name] will be able to demonstrate 5/5 strength in Flex and Abd and ER of Left shoulder. Long Term Goal 3: Patient will be able to reach forward and to both sides without gaurding. Long Term Goal 4: Patient will be able to continue independent with strengthening program at home Long Term Goal 5: Patient will return to full functional mobility and use of the left UE  Problem List Patient Active Problem List  Diagnoses  . Hypertension  . Cerebrovascular disease  . Atrial fibrillation  . Hyperlipidemia  . History of noncompliance with medical treatment  . Aortic stenosis  . Diabetes mellitus  . Dyspnea on exertion  . BPH (benign prostatic hypertrophy) with urinary obstruction    End of Session Activity Tolerance: Patient tolerated treatment well General Behavior During Session: [facility] for tasks performed Cognition: [name] for tasks performed  GO No functional reporting  required   Apryle Stowell L. Noralee Stain, COTA/L  01/30/2012, 2:21 PM

## 2012-02-01 ENCOUNTER — Telehealth (HOSPITAL_COMMUNITY): Payer: Self-pay

## 2012-02-01 ENCOUNTER — Ambulatory Visit (HOSPITAL_COMMUNITY): Payer: Medicare Other | Admitting: Occupational Therapy

## 2012-02-03 ENCOUNTER — Ambulatory Visit (HOSPITAL_COMMUNITY)
Admission: RE | Admit: 2012-02-03 | Discharge: 2012-02-03 | Disposition: A | Discharge status: Home / Self Care | Payer: Medicare Other | Source: Ambulatory Visit | Attending: Orthopaedic Surgery | Admitting: Orthopaedic Surgery

## 2012-02-03 NOTE — Progress Notes (Signed)
Occupational Therapy Treatment  Patient Details  Name: [name] [name] MRN: [id number] Date of Birth: [birth year]  Today's Date: 02/03/2012 Time: 4098-1191 Time Calculation (min): 34 min  Visit#: 12  of 30   Re-eval: 02/20/12 Therex 132-157 22' Manual Therapy 158-206     Subjective Symptoms/Limitations Symptoms: S:  I don't feel well, I didn't sleep at all last night. Pain Assessment Currently in Pain?: No/denies  Precautions/Restrictions     Exercise/Treatments Supine Protraction: PROM;10 reps Horizontal ABduction: PROM;10 reps External Rotation: PROM;10 reps Internal Rotation: PROM;10 reps Flexion: PROM;10 reps ABduction: PROM;10 reps Seated Extension: Theraband;15 reps Theraband Level (Shoulder Extension): Level 4 (Blue) Retraction: Theraband;15 reps Theraband Level (Shoulder Retraction): Level 4 (Blue) Row: Theraband;15 reps Theraband Level (Shoulder Row): Level 4 (Blue) External Rotation: Theraband;15 reps Theraband Level (Shoulder External Rotation): Level 4 (Blue) Internal Rotation: Theraband;15 reps Theraband Level (Shoulder Internal Rotation): Level 4 (Blue)    Therapy Ball Flexion: 20 reps ABduction: 20 reps Right/Left: 5 reps ROM / Strengthening / Isometric Strengthening UBE (Upper Arm Bike): 3' forward 3' backward 3.0     Manual Therapy Manual Therapy: Myofascial release Myofascial Release: MFR and manual stretching to left UE to decrease restrictions and reach optimal AROM/PROM without pain.  Occupational Therapy Assessment and Plan OT Assessment and Plan Clinical Impression Statement: A:  Held some ex today per patient request secondary to not feeling well. Rehab Potential: Good OT Plan: P:  Resume missed ex.   Goals Home Exercise Program Pt will Perform Home Exercise Program: Independently Short Term Goals Time to Complete Short Term Goals: 3 weeks Short Term Goal 1: Patient will state decreased pain 2/10 with movement  Short Term Goal  2: Increase AROM by 20 degrees Flex and Abd from supine position Short Term Goal 3: Patient will be able to dress himself using BUE's without pain Short Term Goal 4: Patient will be independent with HEP for Stretching and light strengthening. Short Term Goal 5: [name] will increase strenght to 4/5 from 3/5 Long Term Goals Time to Complete Long Term Goals: 8 weeks Long Term Goal 1: [name] will be able to wash and comb his hair with the left UE. Long Term Goal 2: [name] will be able to demonstrate 5/5 strength in Flex and Abd and ER of Left shoulder. Long Term Goal 3: Patient will be able to reach forward and to both sides without gaurding. Long Term Goal 4: Patient will be able to continue independent with strengthening program at home Long Term Goal 5: Patient will return to full functional mobility and use of the left UE  Problem List Patient Active Problem List  Diagnoses  . Hypertension  . Cerebrovascular disease  . Atrial fibrillation  . Hyperlipidemia  . History of noncompliance with medical treatment  . Aortic stenosis  . Diabetes mellitus  . Dyspnea on exertion  . BPH (benign prostatic hypertrophy) with urinary obstruction    End of Session Activity Tolerance: Patient limited by fatigue General Behavior During Session: [facility] for tasks performed Cognition: [facility] for tasks performed  GO No functional reporting required  Dalene Robards L. Noralee Stain, COTA/L  02/03/2012, 2:46 PM

## 2012-02-06 ENCOUNTER — Ambulatory Visit (HOSPITAL_COMMUNITY)
Admission: RE | Admit: 2012-02-06 | Discharge: 2012-02-06 | Disposition: A | Discharge status: Home / Self Care | Payer: Medicare Other | Source: Ambulatory Visit | Attending: Family Medicine | Admitting: Family Medicine

## 2012-02-06 DIAGNOSIS — M25519 Pain in unspecified shoulder: Secondary | ICD-10-CM | POA: Diagnosis not present

## 2012-02-06 NOTE — Progress Notes (Signed)
Occupational Therapy Treatment  Patient Details  Name: KYROLLOS CORDELL MRN: [id number] Date of Birth: [birth year]  Today's Date: 02/06/2012 Time: 4098-1191 Time Calculation (min): 44 min  Visit#: 13  of 30   Re-eval: 02/16/12 Manual Therapy 144-200 16' Therapeutic Exercise 201-228 27'    Subjective Symptoms/Limitations Symptoms: S:  The doctor want me to keep coming for a while. Pain Assessment Currently in Pain?: Yes Pain Score:   4 Pain Location: Shoulder Pain Orientation: Left Pain Type: Acute pain Pain Frequency: Intermittent  Precautions/Restrictions     Exercise/Treatments Supine Protraction: PROM;10 reps Horizontal ABduction: PROM;10 reps External Rotation: PROM;10 reps Internal Rotation: PROM;10 reps Flexion: PROM;10 reps ABduction: PROM;10 reps Seated Extension: Theraband;10 reps Theraband Level (Shoulder Extension): Level 4 (Blue) Retraction: Theraband;10 reps Theraband Level (Shoulder Retraction): Level 4 (Blue) Row: Theraband;10 reps Theraband Level (Shoulder Row): Level 4 (Blue) Protraction: Strengthening;10 reps Horizontal ABduction: Strengthening;10 reps External Rotation: Strengthening;Theraband;10 reps Theraband Level (Shoulder External Rotation): Level 4 (Blue) Internal Rotation: Strengthening;Theraband;10 reps Theraband Level (Shoulder Internal Rotation): Level 4 (Blue) Flexion: Strengthening;10 reps Abduction: Strengthening;10 reps Therapy Ball Flexion: 20 reps ABduction: 20 reps Right/Left: 5 reps ROM / Strengthening / Isometric Strengthening UBE (Upper Arm Bike): 3' forward 3' backward 3.5 Wall Wash: 4' with 1# Proximal Shoulder Strengthening, Supine: x20 Rhythmic Stabilization, Supine: x60'          Manual Therapy Manual Therapy: Myofascial release Myofascial Release: MFR and manual stretching to left UE to provide realease from restrictions to decrease pain and increase A/PROM so patient can return to pain free  ADL's  Occupational Therapy Assessment and Plan OT Assessment and Plan Clinical Impression Statement: A:  Increased rythmic stab supine.  Added tband to HEP. Added 1# to seated ex and d/c supine strengthening. Rehab Potential: Good OT Plan: P:  Add cybex press and row   Goals Home Exercise Program Pt will Perform Home Exercise Program: Independently Short Term Goals Time to Complete Short Term Goals: 3 weeks Short Term Goal 1: Patient will state decreased pain 2/10 with movement  Short Term Goal 2: Increase AROM by 20 degrees Flex and Abd from supine position Short Term Goal 3: Patient will be able to dress himself using BUE's without pain Short Term Goal 4: Patient will be independent with HEP for Stretching and light strengthening. Short Term Goal 5: [name] will increase strenght to 4/5 from 3/5 Long Term Goals Time to Complete Long Term Goals: 8 weeks Long Term Goal 1: [name] will be able to wash and comb his hair with the left UE. Long Term Goal 2: [name] will be able to demonstrate 5/5 strength in Flex and Abd and ER of Left shoulder. Long Term Goal 3: Patient will be able to reach forward and to both sides without gaurding. Long Term Goal 4: Patient will be able to continue independent with strengthening program at home Long Term Goal 5: Patient will return to full functional mobility and use of the left UE  Problem List Patient Active Problem List  Diagnoses  . Hypertension  . Cerebrovascular disease  . Atrial fibrillation  . Hyperlipidemia  . History of noncompliance with medical treatment  . Aortic stenosis  . Diabetes mellitus  . Dyspnea on exertion  . BPH (benign prostatic hypertrophy) with urinary obstruction    End of Session Activity Tolerance: Patient tolerated treatment well General Behavior During Session: [facility] for tasks performed Cognition: [facility] for tasks performed OT Plan of Care OT Home Exercise Plan: Added tband to hep with handouts.  Consulted and  Agree with Plan of Care: Patient  GO No functional reporting required   Kdyn Vonbehren L. Noralee Stain, COTA/L  02/06/2012, 2:34 PM

## 2012-02-08 ENCOUNTER — Ambulatory Visit (HOSPITAL_COMMUNITY)
Admission: RE | Admit: 2012-02-08 | Discharge: 2012-02-08 | Disposition: A | Discharge status: Home / Self Care | Payer: Medicare Other | Source: Ambulatory Visit | Attending: Family Medicine | Admitting: Family Medicine

## 2012-02-08 NOTE — Progress Notes (Signed)
Occupational Therapy Treatment  Patient Details  Name: [name] [name] MRN: [id number] Date of Birth: 1932/06/18  Today's Date: 02/08/2012 Time: 4010-2725 Time Calculation (min): 45 min  Visit#: 14  of 30   Re-eval: 02/16/12 Manual Therapy [id number] 10' Therapeutic Exercise 157-231 34'    Subjective Symptoms/Limitations Symptoms: S:  This morning and last night it was hurting a little but nothing now. Pain Assessment Currently in Pain?: No/denies  Precautions/Restrictions     Exercise/Treatments Supine Protraction: PROM;Strengthening;10 reps Protraction Weight (lbs): 2# Horizontal ABduction: PROM;Strengthening;10 reps Horizontal ABduction Weight (lbs): 2# External Rotation: PROM;Strengthening;10 reps External Rotation Weight (lbs): 2# Internal Rotation: PROM;Strengthening;10 reps Internal Rotation Weight (lbs): 2# Flexion: PROM;Strengthening;10 reps Shoulder Flexion Weight (lbs): 2# ABduction: PROM;Strengthening;10 reps Shoulder ABduction Weight (lbs): 2# Seated Extension: Theraband;10 reps Theraband Level (Shoulder Extension): Level 4 (Blue) Retraction: Theraband;10 reps Theraband Level (Shoulder Retraction): Level 4 (Blue) Row: Theraband;10 reps Theraband Level (Shoulder Row): Level 4 (Blue) Protraction: Strengthening;12 reps Protraction Weight (lbs): 2# Horizontal ABduction: Strengthening;12 reps Horizontal ABduction Weight (lbs): 2# External Rotation: Strengthening;12 reps Theraband Level (Shoulder External Rotation): Level 4 (Blue) External Rotation Weight (lbs): 2# Internal Rotation: Strengthening;12 reps;Theraband;15 reps Theraband Level (Shoulder Internal Rotation): Level 4 (Blue) Internal Rotation Weight (lbs): 2# Flexion: Strengthening;12 reps Flexion Weight (lbs): 2# Abduction: Strengthening;12 reps ABduction Weight (lbs): 2# Therapy Ball Flexion: 20 reps ABduction: 20 reps Right/Left: 5 reps ROM / Strengthening / Isometric Strengthening UBE  (Upper Arm Bike): 3' forward 3' backward 4.0 Wall Wash: 4' with 1#       Manual Therapy Manual Therapy: Myofascial release Myofascial Release: MFR and manual stretching to left UE to provide realease from restrictions to decrease pain and increase A/PROM so patient can return to pain free ADL's  Occupational Therapy Assessment and Plan OT Assessment and Plan Clinical Impression Statement: A:  Added cybex press and row. and 2# to seated and supinie. Rehab Potential: Excellent OT Plan: P:  Increase reps with strengthening.   Goals Home Exercise Program Pt will Perform Home Exercise Program: Independently Short Term Goals Time to Complete Short Term Goals: 3 weeks Short Term Goal 1: Patient will state decreased pain 2/10 with movement  Short Term Goal 2: Increase AROM by 20 degrees Flex and Abd from supine position Short Term Goal 3: Patient will be able to dress himself using BUE's without pain Short Term Goal 4: Patient will be independent with HEP for Stretching and light strengthening. Short Term Goal 5: [name] will increase strenght to 4/5 from 3/5 Long Term Goals Time to Complete Long Term Goals: 8 weeks Long Term Goal 1: [name] will be able to wash and comb his hair with the left UE. Long Term Goal 2: [name] will be able to demonstrate 5/5 strength in Flex and Abd and ER of Left shoulder. Long Term Goal 3: Patient will be able to reach forward and to both sides without gaurding. Long Term Goal 4: Patient will be able to continue independent with strengthening program at home Long Term Goal 5: Patient will return to full functional mobility and use of the left UE  Problem List Patient Active Problem List  Diagnoses  . Hypertension  . Cerebrovascular disease  . Atrial fibrillation  . Hyperlipidemia  . History of noncompliance with medical treatment  . Aortic stenosis  . Diabetes mellitus  . Dyspnea on exertion  . BPH (benign prostatic hypertrophy) with urinary  obstruction    End of Session Activity Tolerance: Patient tolerated treatment well General Behavior During Session:  WFL for tasks performed Cognition: [facility] for tasks performed  GO No functional reporting required   Mahamadou Weltz L. Noralee Stain, COTA/L  02/08/2012, 2:29 PM

## 2012-02-10 ENCOUNTER — Ambulatory Visit (HOSPITAL_COMMUNITY): Payer: Medicare Other | Admitting: Occupational Therapy

## 2012-02-14 ENCOUNTER — Ambulatory Visit (HOSPITAL_COMMUNITY)
Admission: RE | Admit: 2012-02-14 | Discharge: 2012-02-14 | Disposition: A | Discharge status: Home / Self Care | Payer: Medicare Other | Source: Ambulatory Visit | Attending: Family Medicine | Admitting: Family Medicine

## 2012-02-14 DIAGNOSIS — M25619 Stiffness of unspecified shoulder, not elsewhere classified: Secondary | ICD-10-CM | POA: Insufficient documentation

## 2012-02-14 DIAGNOSIS — E785 Hyperlipidemia, unspecified: Secondary | ICD-10-CM | POA: Diagnosis not present

## 2012-02-14 DIAGNOSIS — I10 Essential (primary) hypertension: Secondary | ICD-10-CM | POA: Insufficient documentation

## 2012-02-14 DIAGNOSIS — IMO0001 Reserved for inherently not codable concepts without codable children: Secondary | ICD-10-CM | POA: Insufficient documentation

## 2012-02-14 DIAGNOSIS — M25519 Pain in unspecified shoulder: Secondary | ICD-10-CM | POA: Insufficient documentation

## 2012-02-14 DIAGNOSIS — E119 Type 2 diabetes mellitus without complications: Secondary | ICD-10-CM | POA: Diagnosis not present

## 2012-02-14 DIAGNOSIS — M6281 Muscle weakness (generalized): Secondary | ICD-10-CM | POA: Diagnosis not present

## 2012-02-14 NOTE — Progress Notes (Signed)
Occupational Therapy Treatment  Patient Details  Name: [name] [name] MRN: [id number] Date of Birth: 06-Apr-1932  Today's Date: 02/14/2012 Time: 4401-0272 Time Calculation (min): 37 min  Visit#: 15  of 30   Re-eval: 02/16/12 Therapeutic Exercise [id number] 37'     Subjective Symptoms/Limitations Symptoms: S:  The worst thing about my arm is getting up here for therapy. Pain Assessment Currently in Pain?: Yes Pain Score:   3 Pain Location: Shoulder Pain Orientation: Left Pain Type: Acute pain  Precautions/Restrictions     Exercise/Treatments    Seated Protraction: Strengthening;12 reps Protraction Weight (lbs): 2# Horizontal ABduction: Strengthening;12 reps Horizontal ABduction Weight (lbs): 2# External Rotation: Strengthening;12 reps External Rotation Weight (lbs): 2# Internal Rotation: Strengthening;15 reps Internal Rotation Weight (lbs): 2# Flexion: Strengthening;12 reps Flexion Weight (lbs): 2# Abduction: Strengthening;12 reps ABduction Weight (lbs): 2# Therapy Ball Flexion: 20 reps ABduction: 20 reps Right/Left: 5 reps ROM / Strengthening / Isometric Strengthening UBE (Upper Arm Bike): 3' forward 3' backward 4.5 Wall Wash: 4' with 1# Thumb Tacks: 1' Proximal Shoulder Strengthening, Seated: x20 Rhythmic Stabilization, Seated: x30"  Occupational Therapy Assessment and Plan OT Assessment and Plan Clinical Impression Statement: A:  D/C  tband to HEP,  exercise only today.  No pain after treatment. Rehab Potential: Excellent OT Plan: P:  REASSESS   Goals Home Exercise Program Pt will Perform Home Exercise Program: Independently Short Term Goals Time to Complete Short Term Goals: 3 weeks Short Term Goal 1: Patient will state decreased pain 2/10 with movement  Short Term Goal 2: Increase AROM by 20 degrees Flex and Abd from supine position Short Term Goal 3: Patient will be able to dress himself using BUE's without pain Short Term Goal 4: Patient will be  independent with HEP for Stretching and light strengthening. Short Term Goal 5: [name] will increase strenght to 4/5 from 3/5 Long Term Goals Time to Complete Long Term Goals: 8 weeks Long Term Goal 1: [name] will be able to wash and comb his hair with the left UE. Long Term Goal 2: [name] will be able to demonstrate 5/5 strength in Flex and Abd and ER of Left shoulder. Long Term Goal 3: Patient will be able to reach forward and to both sides without gaurding. Long Term Goal 4: Patient will be able to continue independent with strengthening program at home Long Term Goal 5: Patient will return to full functional mobility and use of the left UE  Problem List Patient Active Problem List  Diagnoses  . Hypertension  . Cerebrovascular disease  . Atrial fibrillation  . Hyperlipidemia  . History of noncompliance with medical treatment  . Aortic stenosis  . Diabetes mellitus  . Dyspnea on exertion  . BPH (benign prostatic hypertrophy) with urinary obstruction    End of Session Activity Tolerance: Patient tolerated treatment well General Behavior During Session: [facility] for tasks performed Cognition: [facility] for tasks performed  GO No functional reporting required  Faelyn Sigler L. Lavita Pontius, COTA/L  02/14/2012, 1:48 PM

## 2012-02-15 DIAGNOSIS — I10 Essential (primary) hypertension: Secondary | ICD-10-CM | POA: Diagnosis not present

## 2012-02-15 DIAGNOSIS — E785 Hyperlipidemia, unspecified: Secondary | ICD-10-CM | POA: Diagnosis not present

## 2012-02-15 DIAGNOSIS — E119 Type 2 diabetes mellitus without complications: Secondary | ICD-10-CM | POA: Diagnosis not present

## 2012-02-15 DIAGNOSIS — J309 Allergic rhinitis, unspecified: Secondary | ICD-10-CM | POA: Diagnosis not present

## 2012-02-15 DIAGNOSIS — I4891 Unspecified atrial fibrillation: Secondary | ICD-10-CM | POA: Diagnosis not present

## 2012-02-16 DIAGNOSIS — Z79899 Other long term (current) drug therapy: Secondary | ICD-10-CM | POA: Diagnosis not present

## 2012-02-16 DIAGNOSIS — E782 Mixed hyperlipidemia: Secondary | ICD-10-CM | POA: Diagnosis not present

## 2012-02-17 ENCOUNTER — Ambulatory Visit (HOSPITAL_COMMUNITY)
Admission: RE | Admit: 2012-02-17 | Discharge: 2012-02-17 | Disposition: A | Discharge status: Home / Self Care | Payer: Medicare Other | Source: Ambulatory Visit | Attending: Family Medicine | Admitting: Family Medicine

## 2012-02-17 NOTE — Progress Notes (Addendum)
Occupational Therapy Treatment  Patient Details  Name: [name] [name] MRN: [id number] Date of Birth: Apr 17, 1932  Today's Date: 02/17/2012 Time: 1478-2956 Time Calculation (min): 52 min Manual Therapy [id number]  16' Therapeutic Exercise [id number] 35'  Visit#: 16  of 30   Re-eval: 02/16/12 Assessment Diagnosis: L Partial Rotator Cuff Injury  Subjective Symptoms/Limitations Symptoms: S:  Sometimes it hurts to reach out sometimes it doesn't  Precautions/Restrictions     Exercise/Treatments Supine Protraction: PROM;Strengthening;10 reps Protraction Weight (lbs): 3# Horizontal ABduction: PROM;Strengthening;10 reps Horizontal ABduction Weight (lbs): 3# External Rotation: PROM;Strengthening;10 reps External Rotation Weight (lbs): 3# Internal Rotation: PROM;Strengthening;10 reps Internal Rotation Weight (lbs): 3# Flexion: PROM;Strengthening;10 reps Shoulder Flexion Weight (lbs): 3# ABduction: PROM;Strengthening;10 reps Shoulder ABduction Weight (lbs): 3# Seated Extension: Theraband;15 reps Theraband Level (Shoulder Extension): Level 4 (Blue) Retraction: Theraband;15 reps Theraband Level (Shoulder Retraction): Level 4 (Blue) Row: Theraband;15 reps Theraband Level (Shoulder Row): Level 4 (Blue) Protraction: Strengthening;15 reps Protraction Weight (lbs): 3# Horizontal ABduction: Strengthening;10 reps Horizontal ABduction Weight (lbs): 3# External Rotation: Strengthening;10 reps;Theraband;15 reps Theraband Level (Shoulder External Rotation): Level 4 (Blue) External Rotation Weight (lbs): 3# Internal Rotation: Strengthening;10 reps;Theraband;15 reps Theraband Level (Shoulder Internal Rotation): Level 4 (Blue) Internal Rotation Weight (lbs): 3# Flexion: Strengthening;10 reps Flexion Weight (lbs): 3# Abduction: Strengthening;10 reps ABduction Weight (lbs): 3# Therapy Ball Flexion: 20 reps ABduction: 20 reps Right/Left: 5 reps ROM / Strengthening / Isometric  Strengthening UBE (Upper Arm Bike): 3' forward 3' backward 5.0 Proximal Shoulder Strengthening, Seated: d/c Rhythmic Stabilization, Seated: d/c         Manual Therapy Manual Therapy: Myofascial release Myofascial Release: MFR and manual stretching to left UE to provide release from restrictions to decrease pain and increase A/PROM so patient can return to pain free ADL's  Occupational Therapy Assessment and Plan OT Assessment and Plan Clinical Impression Statement: A:  See progress note. Rehab Potential: Excellent OT Plan: P:  D/C to HEP.   Goals Home Exercise Program Pt will Perform Home Exercise Program: Independently Short Term Goals Time to Complete Short Term Goals: 3 weeks Short Term Goal 1: Patient will state decreased pain 2/10 with movement  Short Term Goal 1 Progress: Met Short Term Goal 2: Increase AROM by 20 degrees Flex and Abd from supine position Short Term Goal 2 Progress: Met Short Term Goal 3: Patient will be able to dress himself using BUE's without pain Short Term Goal 3 Progress: Met Short Term Goal 4: Patient will be independent with HEP for Stretching and light strengthening. Short Term Goal 4 Progress: Met Short Term Goal 5: [name] will increase strenght to 4/5 from 3/5 Short Term Goal 5 Progress: Met Long Term Goals Time to Complete Long Term Goals: 8 weeks Long Term Goal 1: [name] will be able to wash and comb his hair with the left UE. Long Term Goal 1 Progress: Met Long Term Goal 2: [name] will be able to demonstrate 5/5 strength in Flex and Abd and ER of Left shoulder. Long Term Goal 2 Progress: Met Long Term Goal 3: Patient will be able to reach forward and to both sides without gaurding. Long Term Goal 3 Progress: Met Long Term Goal 4: Patient will be able to continue independent with strengthening program at home Long Term Goal 4 Progress: Met Long Term Goal 5: Patient will return to full functional mobility and use of the left UE Long  Term Goal 5 Progress: Met  Problem List Patient Active Problem List  Diagnoses  . Hypertension  . Cerebrovascular  disease  . Atrial fibrillation  . Hyperlipidemia  . History of noncompliance with medical treatment  . Aortic stenosis  . Diabetes mellitus  . Dyspnea on exertion  . BPH (benign prostatic hypertrophy) with urinary obstruction    End of Session Activity Tolerance: Patient tolerated treatment well General Behavior During Session: [facility] for tasks performed Cognition: [facility] for tasks performed  GO No functional reporting required  Chelcee Korpi L. Aysen Shieh, COTA/L  02/17/2012, 12:01 PM

## 2012-02-20 ENCOUNTER — Ambulatory Visit (HOSPITAL_COMMUNITY): Payer: Medicare Other | Admitting: Occupational Therapy

## 2012-02-21 DIAGNOSIS — E119 Type 2 diabetes mellitus without complications: Secondary | ICD-10-CM | POA: Diagnosis not present

## 2012-02-21 DIAGNOSIS — H2510 Age-related nuclear cataract, unspecified eye: Secondary | ICD-10-CM | POA: Diagnosis not present

## 2012-02-21 DIAGNOSIS — H25019 Cortical age-related cataract, unspecified eye: Secondary | ICD-10-CM | POA: Diagnosis not present

## 2012-02-24 ENCOUNTER — Ambulatory Visit (HOSPITAL_COMMUNITY): Payer: Medicare Other | Admitting: Specialist

## 2012-02-27 ENCOUNTER — Ambulatory Visit (HOSPITAL_COMMUNITY): Payer: Medicare Other | Admitting: Occupational Therapy

## 2012-03-02 ENCOUNTER — Ambulatory Visit (HOSPITAL_COMMUNITY): Payer: Medicare Other | Admitting: Occupational Therapy

## 2012-03-05 ENCOUNTER — Ambulatory Visit (HOSPITAL_COMMUNITY): Payer: Medicare Other | Admitting: Occupational Therapy

## 2012-03-09 ENCOUNTER — Ambulatory Visit (HOSPITAL_COMMUNITY): Payer: Medicare Other | Admitting: Occupational Therapy

## 2012-03-12 ENCOUNTER — Ambulatory Visit (HOSPITAL_COMMUNITY): Payer: Medicare Other | Admitting: Occupational Therapy

## 2012-03-20 DIAGNOSIS — D689 Coagulation defect, unspecified: Secondary | ICD-10-CM | POA: Diagnosis not present

## 2012-03-20 DIAGNOSIS — I4891 Unspecified atrial fibrillation: Secondary | ICD-10-CM | POA: Diagnosis not present

## 2012-04-03 ENCOUNTER — Encounter (HOSPITAL_COMMUNITY): Payer: Self-pay

## 2012-04-03 ENCOUNTER — Emergency Department (HOSPITAL_COMMUNITY): Payer: Medicare Other

## 2012-04-03 ENCOUNTER — Emergency Department (HOSPITAL_COMMUNITY)
Admission: EM | Admit: 2012-04-03 | Discharge: 2012-04-03 | Disposition: A | Discharge status: Home / Self Care | Payer: Medicare Other | Attending: Emergency Medicine | Admitting: Emergency Medicine

## 2012-04-03 DIAGNOSIS — Z8673 Personal history of transient ischemic attack (TIA), and cerebral infarction without residual deficits: Secondary | ICD-10-CM | POA: Diagnosis not present

## 2012-04-03 DIAGNOSIS — R5381 Other malaise: Secondary | ICD-10-CM | POA: Diagnosis not present

## 2012-04-03 DIAGNOSIS — K219 Gastro-esophageal reflux disease without esophagitis: Secondary | ICD-10-CM | POA: Diagnosis not present

## 2012-04-03 DIAGNOSIS — Z79899 Other long term (current) drug therapy: Secondary | ICD-10-CM | POA: Insufficient documentation

## 2012-04-03 DIAGNOSIS — R3915 Urgency of urination: Secondary | ICD-10-CM | POA: Insufficient documentation

## 2012-04-03 DIAGNOSIS — R42 Dizziness and giddiness: Secondary | ICD-10-CM | POA: Diagnosis not present

## 2012-04-03 DIAGNOSIS — R531 Weakness: Secondary | ICD-10-CM

## 2012-04-03 DIAGNOSIS — R0609 Other forms of dyspnea: Secondary | ICD-10-CM | POA: Diagnosis not present

## 2012-04-03 DIAGNOSIS — R4701 Aphasia: Secondary | ICD-10-CM | POA: Diagnosis not present

## 2012-04-03 DIAGNOSIS — E785 Hyperlipidemia, unspecified: Secondary | ICD-10-CM | POA: Diagnosis not present

## 2012-04-03 DIAGNOSIS — I69998 Other sequelae following unspecified cerebrovascular disease: Secondary | ICD-10-CM | POA: Diagnosis not present

## 2012-04-03 DIAGNOSIS — R0602 Shortness of breath: Secondary | ICD-10-CM | POA: Insufficient documentation

## 2012-04-03 DIAGNOSIS — I4891 Unspecified atrial fibrillation: Secondary | ICD-10-CM | POA: Diagnosis not present

## 2012-04-03 DIAGNOSIS — R51 Headache: Secondary | ICD-10-CM

## 2012-04-03 DIAGNOSIS — R0600 Dyspnea, unspecified: Secondary | ICD-10-CM

## 2012-04-03 DIAGNOSIS — I10 Essential (primary) hypertension: Secondary | ICD-10-CM | POA: Insufficient documentation

## 2012-04-03 DIAGNOSIS — D689 Coagulation defect, unspecified: Secondary | ICD-10-CM | POA: Diagnosis not present

## 2012-04-03 DIAGNOSIS — Z7901 Long term (current) use of anticoagulants: Secondary | ICD-10-CM | POA: Diagnosis not present

## 2012-04-03 DIAGNOSIS — E119 Type 2 diabetes mellitus without complications: Secondary | ICD-10-CM | POA: Insufficient documentation

## 2012-04-03 DIAGNOSIS — R4789 Other speech disturbances: Secondary | ICD-10-CM | POA: Diagnosis not present

## 2012-04-03 DIAGNOSIS — R5383 Other fatigue: Secondary | ICD-10-CM | POA: Diagnosis not present

## 2012-04-03 DIAGNOSIS — R0989 Other specified symptoms and signs involving the circulatory and respiratory systems: Secondary | ICD-10-CM | POA: Insufficient documentation

## 2012-04-03 DIAGNOSIS — G459 Transient cerebral ischemic attack, unspecified: Secondary | ICD-10-CM | POA: Diagnosis not present

## 2012-04-03 LAB — COMPREHENSIVE METABOLIC PANEL
AST: 47 U/L — ABNORMAL HIGH (ref 0–37)
Alkaline Phosphatase: 86 U/L (ref 39–117)
BUN: 10 mg/dL (ref 6–23)
CO2: 29 mEq/L (ref 19–32)
Chloride: 95 mEq/L — ABNORMAL LOW (ref 96–112)
Creatinine, Ser: 0.75 mg/dL (ref 0.50–1.35)
GFR calc non Af Amer: 86 mL/min — ABNORMAL LOW (ref >90)
Potassium: 4.1 mEq/L (ref 3.5–5.1)
Total Bilirubin: 0.6 mg/dL (ref 0.3–1.2)

## 2012-04-03 LAB — CBC
HCT: 39.6 % (ref 39.0–52.0)
Hemoglobin: 13.7 g/dL (ref 13.0–17.0)
WBC: 11.4 10*3/uL — ABNORMAL HIGH (ref 4.0–10.5)

## 2012-04-03 LAB — SEDIMENTATION RATE: Sed Rate: 114 mm/hr — ABNORMAL HIGH (ref 0–16)

## 2012-04-03 LAB — URINALYSIS, ROUTINE W REFLEX MICROSCOPIC
Bilirubin Urine: NEGATIVE
Glucose, UA: NEGATIVE mg/dL
Specific Gravity, Urine: <1.005 — ABNORMAL LOW (ref 1.005–1.030)
Urobilinogen, UA: 0.2 mg/dL (ref 0.0–1.0)

## 2012-04-03 LAB — PROTIME-INR: Prothrombin Time: 24 seconds — ABNORMAL HIGH (ref 11.6–15.2)

## 2012-04-03 LAB — DIFFERENTIAL
Basophils Absolute: 0 10*3/uL (ref 0.0–0.1)
Lymphocytes Relative: 10 % — ABNORMAL LOW (ref 12–46)
Monocytes Absolute: 1 10*3/uL (ref 0.1–1.0)
Monocytes Relative: 9 % (ref 3–12)
Neutro Abs: 9.2 10*3/uL — ABNORMAL HIGH (ref 1.7–7.7)

## 2012-04-03 LAB — URINE MICROSCOPIC-ADD ON

## 2012-04-03 LAB — TROPONIN I: Troponin I: <0.3 ng/mL (ref <0.30)

## 2012-04-03 LAB — PRO B NATRIURETIC PEPTIDE: Pro B Natriuretic peptide (BNP): 563.2 pg/mL — ABNORMAL HIGH (ref 0–450)

## 2012-04-03 NOTE — ED Notes (Signed)
Patient transported to CT 

## 2012-04-03 NOTE — ED Notes (Signed)
Pt has not felt well since sat. Having trouble walking for 2 days, tried to mow the grass today came in w/ slurred speech per wife. Pt having general weakness and headache

## 2012-04-03 NOTE — ED Provider Notes (Signed)
History  This chart was scribed for Ward Givens, MD by Bennett Scrape. This patient was seen in room [facility]16A/[facility] and the patient's care was started at 3:42PM.  CSN: [id number]  Arrival date & time 04/03/12  1534   First MD Initiated Contact with Patient 04/03/12 1542      Chief Complaint  Patient presents with  . Aphasia  . Headache  . Shortness of Breath    Patient is a 78 y.o. male presenting with shortness of breath. The history is provided by the patient and the spouse. History Limited By: Poor Historian. No language interpreter was used.  Shortness of Breath  The current episode started more than 2 weeks ago. The onset was gradual. The problem occurs continuously. The problem has been gradually worsening. The symptoms are relieved by nothing. The symptoms are aggravated by nothing. Associated symptoms include shortness of breath. Pertinent negatives include no chest pain, no fever and no cough. His past medical history does not include asthma. There were no sick contacts. He has received no recent medical care.     Siddiq Kaluzny Wedel is a 78 y.o. male who presents to the Emergency Department complaining of slurred speech described by daughter as "having a hard time getting his words out" with associated increased SOB that started around 3 hours ago. Daughter admits that the aphasia could have been caused by the increased SOB. Pt denies any pain or aphasia currently. Pt family states that they were worried about DVT/PE and stroke. Wife states that she also noticed that the pt had trouble walking around the same time. Pt states that he has been experiencing balance problems described feeling like he is leaning forward when walking for the past 3 to 4 weeks which he attributes to arthritis from a foot injury 13 years ago. Wife states that the pt has been weak and SOB for the past 2 months but got worse 2 days ago. Pt states that he felt fine when he woke up at 9AM today. He states that he went  out and moved the lawn on a riding lawn mower, came back in and began to feel increased SOB. Pt states that he is not concerned by the increased SOB or the balance problems. Pt also c/o a HA described as a band around his head that goes from ear to ear for the past 3 months that he attributes to sinuses. He reports HA for the last 4 to 5 years that is normally in his temples. He states that he is not experiencing a band currently, just soreness at the right temple. He denies visual changes. Pt also c/o urgency that started last night. He denies any modifying factors. He has not taken any OTC medications to improve his symptoms. He denies chest pain, leg swelling, diaphoresis and nausea. He has a h/o HTN, CVA, HLD and DM. He is a current everyday tobacco chewer but denies alcohol use. The slurred speech was ? Around 12:30 pm.   PCP is Dr. Lilyan Punt  Past Medical History  Diagnosis Date  . Hypertension     Borderline  . Cerebrovascular disease 07/2010    TIA; carotid ultrasound in 07/2010-significant bilateral plaque without focal internal carotid artery stenosis; MRI -encephalomalacia left temporal and right temporal lobes; small inferior right cerebellar infarct; small vessel disease  . Hyperlipidemia     adverse reactions to statins and niacin  . History of noncompliance with medical treatment   . Aortic stenosis 2007    Very  mild  . Hepatic steatosis   . Diabetes mellitus     no insulin; A1c of 6.6 in 2005  . Atrial fibrillation     Paroxysmal; Echocardiogram in 2007-normal EF; mild LVH; left atrial enlargement; mild stenosis and minimal AI; negative stress nuclear study in 2008  . Exertional dyspnea   . Degenerative joint disease     feet and legs  . Dizziness     occurs daily,especially in am  . Gastroesophageal reflux disease     Past Surgical History  Procedure Date  . Rotator cuff repair     Right  . Lipoma excision 1980  . Urethral stricture dilatation 1980s  . Orif ankle  fracture 2000    Right  . Colonoscopy 2002  . Transurethral resection of prostate 09/2011  . Colonoscopy 01/19/2012    Procedure: COLONOSCOPY;  Surgeon: Malissa Hippo, MD;  Location: [facility];  Service: Endoscopy;  Laterality: N/A;  1030    Family History  Problem Relation Age of Onset  . Stroke Mother   . Diabetes Father   . Colon cancer Neg Hx     History  Substance Use Topics  . Smoking status: Former Smoker -- 1.0 packs/day for 20 years    Types: Cigarettes    Quit date: 04/28/1997  . Smokeless tobacco: Former Neurosurgeon    Quit date: 04/28/1997  . Alcohol Use: No  Chew tobacco-one pack per day Lives at home Lives with spouse   Review of Systems  Constitutional: Negative for fever and chills.  Eyes: Negative for visual disturbance.  Respiratory: Positive for shortness of breath. Negative for cough.   Cardiovascular: Negative for chest pain.  Neurological: Positive for speech difficulty and headaches. Negative for numbness.  All other systems reviewed and are negative.      Allergies  Cholestatin; Lipitor; Ranitidine; and Simvastatin  Home Medications   Current Outpatient Rx  Name Route Sig Dispense Refill  . CITALOPRAM HYDROBROMIDE 20 MG PO TABS Oral Take 20 mg by mouth daily.     . OMEGA-3 FATTY ACIDS 1000 MG PO CAPS Oral Take 1 capsule (1 g total) by mouth 2 (two) times daily. 30 capsule 0    Don't resume for one week  . FLUTICASONE PROPIONATE 50 MCG/ACT NA SUSP Nasal Place 1 spray into the nose daily as needed. Allergies     . GLYBURIDE MICRONIZED 3 MG PO TABS Oral Take 3 mg by mouth daily with breakfast.     . HYDROCODONE-ACETAMINOPHEN 5-325 MG PO TABS Oral Take 1 tablet by mouth every 6 (six) hours as needed. pain    . METOPROLOL SUCCINATE ER 25 MG PO TB24 Oral Take 25 mg by mouth every morning.     . TRAZODONE HCL 100 MG PO TABS Oral Take 100 mg by mouth at bedtime.     . WARFARIN SODIUM 5 MG PO TABS Oral Take 5 mg by mouth daily.     [name] ZOLPIDEM  TARTRATE 10 MG PO TABS Oral Take 10 mg by mouth at bedtime as needed. For sleep      Triage Vitals: BP 151/81  Pulse 101  Temp(Src) 98.1 F (36.7 C) (Oral)  Resp 18  Ht 6\' 1"  (1.854 m)  Wt 255 lb (115.667 kg)  BMI 33.64 kg/m2  SpO2 97%  Vital signs normal except for hypertension, tachycardia   Physical Exam  Nursing note and vitals reviewed. Constitutional: He is oriented to person, place, and time. He appears well-developed and well-nourished.  Non-toxic  appearance. He does not appear ill. No distress.  HENT:  Head: Normocephalic and atraumatic.  Right Ear: External ear normal.  Left Ear: External ear normal.  Nose: Nose normal. No mucosal edema or rhinorrhea.  Mouth/Throat: Oropharynx is clear and moist and mucous membranes are normal. No dental abscesses or uvula swelling.  Eyes: Conjunctivae and EOM are normal. Pupils are equal, round, and reactive to light.  Neck: Normal range of motion and full passive range of motion without pain. Neck supple. No tracheal deviation present.  Cardiovascular: Normal rate, regular rhythm and normal heart sounds.  Exam reveals no gallop and no friction rub.   No murmur heard. Pulmonary/Chest: Effort normal and breath sounds normal. No respiratory distress. He has no wheezes. He has no rhonchi. He has no rales. He exhibits no tenderness and no crepitus.  Abdominal: Soft. Normal appearance and bowel sounds are normal. He exhibits no distension. There is no tenderness. There is no rebound and no guarding.  Musculoskeletal: Normal range of motion. He exhibits no edema and no tenderness.       Moves all extremities well.   Neurological: He is alert and oriented to person, place, and time. He has normal strength. No cranial nerve deficit.       No pronator's drift, finger to nose is normal, heel to shin is normal bilaterally  Skin: Skin is warm, dry and intact. No rash noted. No erythema. No pallor.  Psychiatric: He has a normal mood and affect. His  speech is normal and behavior is normal. His mood appears not anxious.    ED Course  Procedures (including critical care time)  DIAGNOSTIC STUDIES: Oxygen Saturation is 97% on room air, adequate by my interpretation.    COORDINATION OF CARE: 4:10PM-Discussed treatment plan which includes CT of head and urinalysis with pt and pt agreed to plan. 7:00PM-Informed pt that CT scans of head show old CVA but nothing new, abnormal inflammation blood work and negative chest x-ray. Discussed following up with Dr. Gerda Diss for the abnormal inflammation blood work. Will provide pain medication and discharge pt home.   19:44 pt ambulating in the hall without difficulty and without assistance.  Results for orders placed during the hospital encounter of 04/03/12  SEDIMENTATION RATE      Component Value Range   Sed Rate 114 (*) 0 - 16 (mm/hr)  CBC      Component Value Range   WBC 11.4 (*) 4.0 - 10.5 (K/uL)   RBC 4.45  4.22 - 5.81 (MIL/uL)   Hemoglobin 13.7  13.0 - 17.0 (g/dL)   HCT 62.1  30.8 - 65.7 (%)   MCV 89.0  78.0 - 100.0 (fL)   MCH 30.8  26.0 - 34.0 (pg)   MCHC 34.6  30.0 - 36.0 (g/dL)   RDW 84.6  96.2 - 95.2 (%)   Platelets 315  150 - 400 (K/uL)  DIFFERENTIAL      Component Value Range   Neutrophils Relative 81 (*) 43 - 77 (%)   Neutro Abs 9.2 (*) 1.7 - 7.7 (K/uL)   Lymphocytes Relative 10 (*) 12 - 46 (%)   Lymphs Abs 1.1  0.7 - 4.0 (K/uL)   Monocytes Relative 9  3 - 12 (%)   Monocytes Absolute 1.0  0.1 - 1.0 (K/uL)   Eosinophils Relative 1  0 - 5 (%)   Eosinophils Absolute 0.1  0.0 - 0.7 (K/uL)   Basophils Relative 0  0 - 1 (%)   Basophils Absolute 0.0  0.0 - 0.1 (K/uL)  COMPREHENSIVE METABOLIC PANEL      Component Value Range   Sodium 133 (*) 135 - 145 (mEq/L)   Potassium 4.1  3.5 - 5.1 (mEq/L)   Chloride 95 (*) 96 - 112 (mEq/L)   CO2 29  19 - 32 (mEq/L)   Glucose, Bld 97  70 - 99 (mg/dL)   BUN 10  6 - 23 (mg/dL)   Creatinine, Ser 9.14  0.50 - 1.35 (mg/dL)   Calcium 9.5  8.4  - 10.5 (mg/dL)   Total Protein 7.1  6.0 - 8.3 (g/dL)   Albumin 2.8 (*) 3.5 - 5.2 (g/dL)   AST 47 (*) 0 - 37 (U/L)   ALT 68 (*) 0 - 53 (U/L)   Alkaline Phosphatase 86  39 - 117 (U/L)   Total Bilirubin 0.6  0.3 - 1.2 (mg/dL)   GFR calc non Af Amer 86 (*) >90 (mL/min)   GFR calc Af Amer >90  >90 (mL/min)  APTT      Component Value Range   aPTT 67 (*) 24 - 37 (seconds)  PROTIME-INR      Component Value Range   Prothrombin Time 24.0 (*) 11.6 - 15.2 (seconds)   INR 2.11 (*) 0.00 - 1.49   URINALYSIS, ROUTINE W REFLEX MICROSCOPIC      Component Value Range   Color, Urine YELLOW  YELLOW    APPearance CLEAR  CLEAR    Specific Gravity, Urine <1.005 (*) 1.005 - 1.030    pH 6.0  5.0 - 8.0    Glucose, UA NEGATIVE  NEGATIVE (mg/dL)   Hgb urine dipstick LARGE (*) NEGATIVE    Bilirubin Urine NEGATIVE  NEGATIVE    Ketones, ur NEGATIVE  NEGATIVE (mg/dL)   Protein, ur NEGATIVE  NEGATIVE (mg/dL)   Urobilinogen, UA 0.2  0.0 - 1.0 (mg/dL)   Nitrite NEGATIVE  NEGATIVE    Leukocytes, UA NEGATIVE  NEGATIVE   PRO B NATRIURETIC PEPTIDE      Component Value Range   Pro B Natriuretic peptide (BNP) 563.2 (*) 0 - 450 (pg/mL)  TROPONIN I      Component Value Range   Troponin I <0.30  <0.30 (ng/mL)  URINE MICROSCOPIC-ADD ON      Component Value Range   Squamous Epithelial / LPF RARE  RARE    WBC, UA 0-2  <3 (WBC/hpf)   RBC / HPF 7-10  <3 (RBC/hpf)   Bacteria, UA RARE  RARE    Laboratory interpretation all normal except therapeutic INR, mild leukocytosis, mild hyponatremia and low chloride, minor elevation of LFTs   Dg Chest 2 View  04/03/2012  *RADIOLOGY REPORT*  Clinical Data: Shortness of breath, head pain, aphasia, history hypertension, diabetes, atrial fibrillation, GERD  CHEST - 2 VIEW  Comparison: 07/01/2011  Findings: Borderline enlargement of cardiac silhouette. Mediastinal contours and pulmonary vascularity normal. Mild chronic peribronchial thickening. No infiltrate, pleural effusion or  pneumothorax. Bones appear demineralized.  IMPRESSION: Chronic bronchitic changes. Borderline enlargement of cardiac silhouette.  Original Report Authenticated By: Lollie Marrow, M.D.   Ct Head Wo Contrast  04/03/2012  *RADIOLOGY REPORT*  Clinical Data: Weakness and dizziness.  CT HEAD WITHOUT CONTRAST  Technique:  Contiguous axial images were obtained from the base of the skull through the vertex without contrast.  Comparison: Head CT 07/12/2011.  Findings: There is a well-defined focus of decreased attenuation in the posterior limb of the left internal capsule which is unchanged, consistent with an old lacunar infarction.  Well-defined  areas of decreased attenuation are also noted in the posterior right cerebellar hemisphere, and in the anterior aspect of the right temporal lobe, compatible with areas of encephalomalacia from old infarctions.  No definite acute intracranial abnormalities. Specifically, no definitive signs of acute/subacute cerebral ischemia, no focal mass or mass effect, no hemorrhage, no hydrocephalus or abnormal intra or extra-axial fluid collections. No acute displaced skull fractures are noted.  The visualized paranasal sinuses and mastoids are well pneumatized.  IMPRESSION: 1.  No acute intracranial abnormalities. 2.  Sequela of multiple old infarctions in the anterior aspect of the right temporal lobe, right cerebellar hemisphere and posterior limb of the left internal capsule, similar to 07/12/2011.  Original Report Authenticated By: Florencia Reasons, M.D.      Date: 04/03/2012  Rate: 97  Rhythm: atrial fibrillation  QRS Axis: normal  Intervals: normal  ST/T Wave abnormalities: normal  Conduction Disutrbances:none  Narrative Interpretation:   Old EKG Reviewed: unchanged from 09/20/2011    1. Dyspnea   2. Weakness   3. Headache    Plan discharge  Devoria Albe, MD, FACEP    MDM   I personally performed the services described in this documentation, which was  scribed in my presence. The recorded information has been reviewed and considered. Devoria Albe, MD, Armando Gang         Ward Givens, MD 04/03/12 1945

## 2012-04-03 NOTE — ED Notes (Signed)
Bladder scan =70ML

## 2012-04-03 NOTE — Discharge Instructions (Signed)
Your head CT tonight shows an old stroke but no bleeding which was a concern because of the Coumadin. Your MRI does not show an acute stroke tonight or in the past couple days. You do have a very elevated sedimentation rate and with the pain in your temples is a concern for temporal arteritis. Dr. Gerda Diss can arrange to have you get a biopsy of the artery and your temple to see if you have temporal arteritis. Please call his office in the morning so he can look at the tests you had done today.

## 2012-04-04 ENCOUNTER — Other Ambulatory Visit: Payer: Self-pay | Admitting: Family Medicine

## 2012-04-04 DIAGNOSIS — G459 Transient cerebral ischemic attack, unspecified: Secondary | ICD-10-CM

## 2012-04-06 ENCOUNTER — Ambulatory Visit (HOSPITAL_COMMUNITY)
Admission: RE | Admit: 2012-04-06 | Discharge: 2012-04-06 | Disposition: A | Discharge status: Home / Self Care | Payer: Medicare Other | Source: Ambulatory Visit | Attending: Family Medicine | Admitting: Family Medicine

## 2012-04-06 DIAGNOSIS — R4789 Other speech disturbances: Secondary | ICD-10-CM | POA: Diagnosis not present

## 2012-04-06 DIAGNOSIS — I6529 Occlusion and stenosis of unspecified carotid artery: Secondary | ICD-10-CM | POA: Diagnosis not present

## 2012-04-06 DIAGNOSIS — Z87891 Personal history of nicotine dependence: Secondary | ICD-10-CM | POA: Insufficient documentation

## 2012-04-06 DIAGNOSIS — H538 Other visual disturbances: Secondary | ICD-10-CM | POA: Insufficient documentation

## 2012-04-06 DIAGNOSIS — E119 Type 2 diabetes mellitus without complications: Secondary | ICD-10-CM | POA: Insufficient documentation

## 2012-04-06 DIAGNOSIS — G459 Transient cerebral ischemic attack, unspecified: Secondary | ICD-10-CM | POA: Diagnosis not present

## 2012-04-10 ENCOUNTER — Other Ambulatory Visit: Payer: Self-pay | Admitting: Family Medicine

## 2012-04-10 DIAGNOSIS — I4891 Unspecified atrial fibrillation: Secondary | ICD-10-CM | POA: Diagnosis not present

## 2012-04-10 DIAGNOSIS — R0609 Other forms of dyspnea: Secondary | ICD-10-CM | POA: Diagnosis not present

## 2012-04-10 DIAGNOSIS — R1084 Generalized abdominal pain: Secondary | ICD-10-CM | POA: Diagnosis not present

## 2012-04-10 DIAGNOSIS — R0989 Other specified symptoms and signs involving the circulatory and respiratory systems: Secondary | ICD-10-CM | POA: Diagnosis not present

## 2012-04-12 ENCOUNTER — Emergency Department (HOSPITAL_COMMUNITY)
Admission: EM | Admit: 2012-04-12 | Discharge: 2012-04-12 | Disposition: A | Discharge status: Home / Self Care | Payer: Medicare Other | Attending: Emergency Medicine | Admitting: Emergency Medicine

## 2012-04-12 ENCOUNTER — Emergency Department (HOSPITAL_COMMUNITY): Payer: Medicare Other

## 2012-04-12 ENCOUNTER — Other Ambulatory Visit: Payer: Self-pay

## 2012-04-12 ENCOUNTER — Encounter (HOSPITAL_COMMUNITY): Payer: Self-pay | Admitting: *Deleted

## 2012-04-12 ENCOUNTER — Ambulatory Visit (HOSPITAL_COMMUNITY)
Admission: RE | Admit: 2012-04-12 | Discharge: 2012-04-12 | Disposition: A | Discharge status: Home / Self Care | Payer: Medicare Other | Source: Ambulatory Visit | Attending: Family Medicine | Admitting: Family Medicine

## 2012-04-12 DIAGNOSIS — E119 Type 2 diabetes mellitus without complications: Secondary | ICD-10-CM | POA: Insufficient documentation

## 2012-04-12 DIAGNOSIS — R0609 Other forms of dyspnea: Secondary | ICD-10-CM | POA: Diagnosis not present

## 2012-04-12 DIAGNOSIS — I359 Nonrheumatic aortic valve disorder, unspecified: Secondary | ICD-10-CM

## 2012-04-12 DIAGNOSIS — I10 Essential (primary) hypertension: Secondary | ICD-10-CM | POA: Diagnosis not present

## 2012-04-12 DIAGNOSIS — E785 Hyperlipidemia, unspecified: Secondary | ICD-10-CM | POA: Insufficient documentation

## 2012-04-12 DIAGNOSIS — Z8673 Personal history of transient ischemic attack (TIA), and cerebral infarction without residual deficits: Secondary | ICD-10-CM | POA: Insufficient documentation

## 2012-04-12 DIAGNOSIS — R0602 Shortness of breath: Secondary | ICD-10-CM | POA: Diagnosis not present

## 2012-04-12 DIAGNOSIS — R109 Unspecified abdominal pain: Secondary | ICD-10-CM | POA: Diagnosis not present

## 2012-04-12 DIAGNOSIS — R0600 Dyspnea, unspecified: Secondary | ICD-10-CM

## 2012-04-12 DIAGNOSIS — K7689 Other specified diseases of liver: Secondary | ICD-10-CM | POA: Diagnosis not present

## 2012-04-12 DIAGNOSIS — R0989 Other specified symptoms and signs involving the circulatory and respiratory systems: Secondary | ICD-10-CM | POA: Insufficient documentation

## 2012-04-12 DIAGNOSIS — I509 Heart failure, unspecified: Secondary | ICD-10-CM | POA: Diagnosis not present

## 2012-04-12 DIAGNOSIS — Z79899 Other long term (current) drug therapy: Secondary | ICD-10-CM | POA: Diagnosis not present

## 2012-04-12 LAB — CBC
HCT: 36.1 % — ABNORMAL LOW (ref 39.0–52.0)
MCHC: 34.9 g/dL (ref 30.0–36.0)
MCV: 87.8 fL (ref 78.0–100.0)
Platelets: 461 10*3/uL — ABNORMAL HIGH (ref 150–400)
RDW: 12.5 % (ref 11.5–15.5)
WBC: 15.7 10*3/uL — ABNORMAL HIGH (ref 4.0–10.5)

## 2012-04-12 LAB — DIFFERENTIAL
Basophils Absolute: 0 10*3/uL (ref 0.0–0.1)
Basophils Relative: 0 % (ref 0–1)
Eosinophils Relative: 1 % (ref 0–5)
Lymphocytes Relative: 6 % — ABNORMAL LOW (ref 12–46)
Monocytes Absolute: 0.8 10*3/uL (ref 0.1–1.0)
Neutro Abs: 13.8 10*3/uL — ABNORMAL HIGH (ref 1.7–7.7)

## 2012-04-12 LAB — PRO B NATRIURETIC PEPTIDE: Pro B Natriuretic peptide (BNP): 935.3 pg/mL — ABNORMAL HIGH (ref 0–450)

## 2012-04-12 LAB — COMPREHENSIVE METABOLIC PANEL
ALT: 155 U/L — ABNORMAL HIGH (ref 0–53)
AST: 93 U/L — ABNORMAL HIGH (ref 0–37)
Albumin: 2.1 g/dL — ABNORMAL LOW (ref 3.5–5.2)
CO2: 27 mEq/L (ref 19–32)
Calcium: 8.8 mg/dL (ref 8.4–10.5)
Creatinine, Ser: 0.68 mg/dL (ref 0.50–1.35)
Sodium: 130 mEq/L — ABNORMAL LOW (ref 135–145)

## 2012-04-12 NOTE — ED Provider Notes (Cosign Needed Addendum)
History     CSN: [id number]  Arrival date & time 04/12/12  1551   First MD Initiated Contact with Patient 04/12/12 1626      Chief Complaint  Patient presents with  . Shortness of Breath    (Consider location/radiation/quality/duration/timing/severity/associated sxs/prior treatment) HPI Comments: Sent here for shortness of breath, worse over the past several days.  Was seen by Dr. Gerda Diss and was sent here for eval as he had an elevated wbc.  He denies fever or chills.  No chest pain.  Patient is a 78 y.o. male presenting with shortness of breath. The history is provided by the patient.  Shortness of Breath  The current episode started 3 to 5 days ago. The onset was gradual. The problem occurs continuously. The problem has been gradually worsening. The problem is moderate. The symptoms are relieved by nothing. The symptoms are aggravated by activity. Associated symptoms include shortness of breath.    Past Medical History  Diagnosis Date  . Hypertension     Borderline  . Cerebrovascular disease 07/2010    TIA; carotid ultrasound in 07/2010-significant bilateral plaque without focal internal carotid artery stenosis; MRI -encephalomalacia left temporal and right temporal lobes; small inferior right cerebellar infarct; small vessel disease  . Hyperlipidemia     adverse reactions to statins and niacin  . History of noncompliance with medical treatment   . Aortic stenosis 2007    Very mild  . Hepatic steatosis   . Diabetes mellitus     no insulin; A1c of 6.6 in 2005  . Atrial fibrillation     Paroxysmal; Echocardiogram in 2007-normal EF; mild LVH; left atrial enlargement; mild stenosis and minimal AI; negative stress nuclear study in 2008  . Exertional dyspnea   . Degenerative joint disease     feet and legs  . Dizziness     occurs daily,especially in am  . Gastroesophageal reflux disease     Past Surgical History  Procedure Date  . Rotator cuff repair     Right  . Lipoma  excision 1980  . Urethral stricture dilatation 1980s  . Orif ankle fracture 2000    Right  . Colonoscopy 2002  . Transurethral resection of prostate 09/2011  . Colonoscopy 01/19/2012    Procedure: COLONOSCOPY;  Surgeon: Malissa Hippo, MD;  Location: [facility];  Service: Endoscopy;  Laterality: N/A;  1030    Family History  Problem Relation Age of Onset  . Stroke Mother   . Diabetes Father   . Colon cancer Neg Hx     History  Substance Use Topics  . Smoking status: Former Smoker -- 1.0 packs/day for 20 years    Types: Cigarettes    Quit date: 04/28/1997  . Smokeless tobacco: Former Neurosurgeon    Quit date: 04/28/1997  . Alcohol Use: No      Review of Systems  Respiratory: Positive for shortness of breath.   All other systems reviewed and are negative.    Allergies  Cholestatin; Lipitor; Ranitidine; and Simvastatin  Home Medications   Current Outpatient Rx  Name Route Sig Dispense Refill  . CITALOPRAM HYDROBROMIDE 20 MG PO TABS Oral Take 20 mg by mouth daily.     . OMEGA-3 FATTY ACIDS 1000 MG PO CAPS Oral Take 1 capsule (1 g total) by mouth 2 (two) times daily. 30 capsule 0    Don't resume for one week  . FLUTICASONE PROPIONATE 50 MCG/ACT NA SUSP Nasal Place 1 spray into the nose daily as  needed. Allergies     . GLYBURIDE MICRONIZED 3 MG PO TABS Oral Take 3 mg by mouth daily with breakfast.     . HYDROCODONE-ACETAMINOPHEN 5-325 MG PO TABS Oral Take 1 tablet by mouth every 6 (six) hours as needed. pain    . METOPROLOL SUCCINATE ER 25 MG PO TB24 Oral Take 25 mg by mouth every morning.     . TRAZODONE HCL 100 MG PO TABS Oral Take 100 mg by mouth at bedtime.     . WARFARIN SODIUM 5 MG PO TABS Oral Take 5 mg by mouth daily.     [name] ZOLPIDEM TARTRATE 10 MG PO TABS Oral Take 10 mg by mouth at bedtime as needed. For sleep      BP 114/66  Pulse 100  Resp 24  Ht 6\' 1"  (1.854 m)  Wt 252 lb (114.306 kg)  BMI 33.25 kg/m2  SpO2 100%  Physical Exam  Nursing note and vitals  reviewed. Constitutional: He is oriented to person, place, and time. He appears well-developed and well-nourished. No distress.  HENT:  Head: Normocephalic and atraumatic.  Neck: Normal range of motion. Neck supple.  Cardiovascular: Normal rate and regular rhythm.   No murmur heard. Pulmonary/Chest: Effort normal and breath sounds normal. No respiratory distress. He has no wheezes.  Abdominal: Soft. Bowel sounds are normal. He exhibits no distension. There is no tenderness.  Musculoskeletal: Normal range of motion. He exhibits edema.       1+ ble pitting edema.  Neurological: He is alert and oriented to person, place, and time. No cranial nerve deficit.  Skin: Skin is warm and dry. He is not diaphoretic.    ED Course  Procedures (including critical care time)   Labs Reviewed  PRO B NATRIURETIC PEPTIDE  CBC  DIFFERENTIAL  COMPREHENSIVE METABOLIC PANEL  TROPONIN I   US Abdomen Complete  04/12/2012  *RADIOLOGY REPORT*  Clinical Data:  Abdominal pain.  COMPLETE ABDOMINAL ULTRASOUND  Comparison:  08/12/2010  Findings:  Gallbladder:  No gallstones, gallbladder wall thickening, or pericholecystic fluid.  Common bile duct:   Normal caliber, 3 mm.  Liver:  No focal lesion identified.  Within normal limits in parenchymal echogenicity.  IVC:  Appears normal.  Pancreas:  No focal abnormality seen.  Spleen:  Within normal limits in size and echotexture.  Right Kidney:   Normal in size and parenchymal echogenicity.  No evidence of mass or hydronephrosis.  Left Kidney:  2.9 simple appearing cyst in the upper pole.  No hydronephrosis.  Abdominal aorta:  No aneurysm identified.  IMPRESSION: No acute findings sonographically.  Original Report Authenticated By: Cyndie Chime, M.D.     No diagnosis found.   Date: 04/12/2012  Rate: 99  Rhythm: atrial fibrillation  QRS Axis: normal  Intervals: normal  ST/T Wave abnormalities: normal  Conduction Disutrbances:none  Narrative Interpretation:   Old  EKG Reviewed: unchanged    MDM  The patient was sent here by Dr. Gerda Diss for eval of shortness of breath and elevated wbc count.  He had an echo today that I am unable to find the results.  The workup reveals an elevated wbc, bnp, but no fever or infiltrate on the xray.  He is adamant about needing to go home to care for his wife.  His o2 sats have been 98% or better while he has been here.  I will discharge him to home, to return prn.  He has a followup appointment with his cardiologist tomorrow and I have  urged him to keep this.        Geoffery Lyons, MD 04/12/12 1610  Geoffery Lyons, MD 04/12/12 [phone]

## 2012-04-12 NOTE — Progress Notes (Signed)
*  PRELIMINARY RESULTS* Echocardiogram 2D Echocardiogram has been performed.  Caswell Corwin 04/12/2012, 9:27 AM

## 2012-04-12 NOTE — ED Notes (Signed)
Pt c/o shortness of breath. Pt seen by Dr. Gerda Diss on Tuesday and sent for outpatient labs. Pt was told by Dr. Gerda Diss to come to ED for further evaluation for elevated WBC.

## 2012-04-12 NOTE — ED Notes (Signed)
Per Dr. Judd Lien pt able to eat.

## 2012-04-13 ENCOUNTER — Ambulatory Visit (INDEPENDENT_AMBULATORY_CARE_PROVIDER_SITE_OTHER): Payer: Medicare Other | Admitting: Adult Health

## 2012-04-13 ENCOUNTER — Encounter: Payer: Self-pay | Admitting: Adult Health

## 2012-04-13 VITALS — BP 123/72 | HR 99 | Resp 18 | Ht 73.0 in | Wt 255.0 lb

## 2012-04-13 DIAGNOSIS — I10 Essential (primary) hypertension: Secondary | ICD-10-CM

## 2012-04-13 DIAGNOSIS — R0602 Shortness of breath: Secondary | ICD-10-CM

## 2012-04-13 DIAGNOSIS — I4891 Unspecified atrial fibrillation: Secondary | ICD-10-CM | POA: Diagnosis not present

## 2012-04-13 DIAGNOSIS — I739 Peripheral vascular disease, unspecified: Secondary | ICD-10-CM | POA: Diagnosis not present

## 2012-04-13 DIAGNOSIS — R0989 Other specified symptoms and signs involving the circulatory and respiratory systems: Secondary | ICD-10-CM

## 2012-04-13 DIAGNOSIS — I679 Cerebrovascular disease, unspecified: Secondary | ICD-10-CM

## 2012-04-13 DIAGNOSIS — I2510 Atherosclerotic heart disease of native coronary artery without angina pectoris: Secondary | ICD-10-CM

## 2012-04-13 NOTE — Assessment & Plan Note (Addendum)
He is scheduled to see vascular surgeon on 04/25/2012 for discussion of need intervention.

## 2012-04-13 NOTE — Assessment & Plan Note (Signed)
I have spent over 35 minutes with this patient and his son answering multiple questions, going over the labs, x-rays, and ultrasounds, along with echocardiogram results obtained just prior to his appointment.  He does not seem satisfied that there is no cardiac issue causing his symptoms. I have reviewed stress echo completed in 2012 demonstrating no evidence of stress induced ischemia.  After long discussion, I will plan a dobutamine stress myoview to evaluate for scintigraphic evidence of ischemia.   I do not think his symptoms are related to cardiac etiology. He is overweight with large abdomen. He is deconditioned due to pain in his back and chronic right leg weakness from spinal stenosis. Weight loss is best treatment to allow him to have better stamina. He will follow-up with Dr. Dietrich Pates on next appointment to discuss the results of myoview.

## 2012-04-13 NOTE — Patient Instructions (Addendum)
**Note De-Identified  Obfuscation** Your physician has requested that you have a dobutamine myoview. For furth information please visit [email or web address] Please follow instruction sheet, as given.  Your physician recommends that you schedule a follow-up appointment in: after test

## 2012-04-13 NOTE — Assessment & Plan Note (Signed)
Rate is well controlled. He will continue coumadin as directed. No changes.

## 2012-04-13 NOTE — Progress Notes (Signed)
HPI: Mr. Radloff is a 78 y/o patient of Dr. Dietrich Pates we are following for known history of CVA, atrial fibrillation, mild AoV stenosis, hypertension, with multiple somatic complaints. He has recently been seen by Dr. Gerda Diss after experiencing multiple tests for several complaints of fatigue, DOE and leg pain. He has a history of spinal stenosis, GERD and diabetes.   Echocardiogram was completed demonstrating Normal EF of 50-55%, only mild AoV stenosis, with moderately dilated left atrium. He has had PFT's in the past that were found to be normal. He was not anemic. Abdominal ultrasound was found to be normal without evidence of AAA. Carotid ultrasound which was abnormal for Right external carotid stenosis. He is to see the vascular surgeon next month.    He continues to insist that he is not feeling well and cannot walk more that 30 feet without having to stop and catch his breath and rest. He has no energy. He also complains of right leg pain with walking.    Allergies  Allergen Reactions  . Cholestatin   . Lipitor (Atorvastatin Calcium) Other (See Comments)    myalgias  . Ranitidine Other (See Comments)    Chest discomfort  . Simvastatin Other (See Comments)    Myalgias    Current Outpatient Prescriptions  Medication Sig Dispense Refill  . albuterol (PROVENTIL HFA;VENTOLIN HFA) 108 (90 BASE) MCG/ACT inhaler Inhale 2 puffs into the lungs as needed.      . fish oil-omega-3 fatty acids 1000 MG capsule Take 1 capsule (1 g total) by mouth 2 (two) times daily.  30 capsule  0  . fluticasone (FLONASE) 50 MCG/ACT nasal spray Place 1 spray into the nose daily as needed. Allergies       . furosemide (LASIX) 20 MG tablet Take 20-40 mg by mouth daily. Take two tablets by mouth on day 1, then take one tablet thereafter as directed by physician. (Two tablets (40mg ) were taken today as initial dose as directed by physician)      . glyBURIDE micronized (GLYNASE) 3 MG tablet Take 3 mg by mouth daily with  breakfast.       . HYDROcodone-acetaminophen (NORCO) 10-325 MG per tablet Take 1 tablet by mouth every 6 (six) hours as needed.      . metoprolol succinate (TOPROL-XL) 25 MG 24 hr tablet Take 25 mg by mouth every morning.       . potassium chloride (K-DUR) 10 MEQ tablet Take 20 mEq by mouth daily.      [name] warfarin (COUMADIN) 5 MG tablet Take 5 mg by mouth every evening.       . zolpidem (AMBIEN) 10 MG tablet Take 10 mg by mouth at bedtime as needed. For sleep        Past Medical History  Diagnosis Date  . Hypertension     Borderline  . Cerebrovascular disease 07/2010    TIA; carotid ultrasound in 07/2010-significant bilateral plaque without focal internal carotid artery stenosis; MRI -encephalomalacia left temporal and right temporal lobes; small inferior right cerebellar infarct; small vessel disease  . Hyperlipidemia     adverse reactions to statins and niacin  . History of noncompliance with medical treatment   . Aortic stenosis 2007    Very mild  . Hepatic steatosis   . Diabetes mellitus     no insulin; A1c of 6.6 in 2005  . Atrial fibrillation     Paroxysmal; Echocardiogram in 2007-normal EF; mild LVH; left atrial enlargement; mild stenosis and minimal AI; negative  stress nuclear study in 2008  . Exertional dyspnea   . Degenerative joint disease     feet and legs  . Dizziness     occurs daily,especially in am  . Gastroesophageal reflux disease     Past Surgical History  Procedure Date  . Rotator cuff repair     Right  . Lipoma excision 1980  . Urethral stricture dilatation 1980s  . Orif ankle fracture 2000    Right  . Colonoscopy 2002  . Transurethral resection of prostate 09/2011  . Colonoscopy 01/19/2012    Procedure: COLONOSCOPY;  Surgeon: Malissa Hippo, MD;  Location: [facility];  Service: Endoscopy;  Laterality: N/A;  1030    AVW:UJWJXB of systems complete and found to be negative unless listed above  PHYSICAL EXAM BP 123/72  Pulse 99  Resp 18  Ht 6\' 1"   (1.854 m)  Wt 255 lb (115.667 kg)  BMI 33.64 kg/m2  General: Well developed, well nourished, in no acute distress Head: Eyes PERRLA, No xanthomas.   Normal cephalic and atramatic  Lungs: Mild inspiratory crackles but wheezes or diminished breath sounds. Heart: HRIR S1 S2, without MRG.  Pulses are 2+ & equal.            No carotid bruit. No JVD.  No abdominal bruits. No femoral bruits. Abdomen: Bowel sounds are positive, abdomen soft and non-tender without masses or Hernia's noted. Obese. Msk:  Back normal, normal gait. Diminhsed strength in the right leg.  Extremities: No clubbing, cyanosis, 1+ edema left leg..  DP +1 Neuro: Alert and oriented X 3. Psych:  Good affect, responds appropriately    ASSESSMENT AND PLAN

## 2012-04-13 NOTE — Assessment & Plan Note (Signed)
Blood pressure is controlled and therefore no further medication changes are needed. He is encouraged to be medically compliant.

## 2012-04-13 NOTE — Progress Notes (Deleted)
HPI: [name] [name] is a 78 y/o patient of Dr. Dietrich Pates we are seeing on hospital follow-iup for ongoing assessment and treatment of CAD and CVRF, hypertension, and COPD. She was admitted to [facility] in the setting of severe neck pain to r/o MI. She was found to have no evidence of ACS. EKG was unremarkable and pain improved with morphine and NTG.    She states that the pain in her neck was unlike any pain she experienced with prior cardiac pain. She also is experiencing pain in her LLE with mild redness in the ankle area and decreased pulses.She has had no further complaints of chest pain or neck pain.  Allergies  Allergen Reactions  . Cholestatin   . Lipitor (Atorvastatin Calcium) Other (See Comments)    myalgias  . Ranitidine Other (See Comments)    Chest discomfort  . Simvastatin Other (See Comments)    Myalgias    Current Outpatient Prescriptions  Medication Sig Dispense Refill  . albuterol (PROVENTIL HFA;VENTOLIN HFA) 108 (90 BASE) MCG/ACT inhaler Inhale 2 puffs into the lungs as needed.      . fish oil-omega-3 fatty acids 1000 MG capsule Take 1 capsule (1 g total) by mouth 2 (two) times daily.  30 capsule  0  . fluticasone (FLONASE) 50 MCG/ACT nasal spray Place 1 spray into the nose daily as needed. Allergies       . furosemide (LASIX) 20 MG tablet Take 20-40 mg by mouth daily. Take two tablets by mouth on day 1, then take one tablet thereafter as directed by physician. (Two tablets (40mg ) were taken today as initial dose as directed by physician)      . glyBURIDE micronized (GLYNASE) 3 MG tablet Take 3 mg by mouth daily with breakfast.       . HYDROcodone-acetaminophen (NORCO) 10-325 MG per tablet Take 1 tablet by mouth every 6 (six) hours as needed.      . metoprolol succinate (TOPROL-XL) 25 MG 24 hr tablet Take 25 mg by mouth every morning.       . potassium chloride (K-DUR) 10 MEQ tablet Take 20 mEq by mouth daily.      [name] warfarin (COUMADIN) 5 MG tablet Take 5 mg by mouth every  evening.       . zolpidem (AMBIEN) 10 MG tablet Take 10 mg by mouth at bedtime as needed. For sleep        Past Medical History  Diagnosis Date  . Hypertension     Borderline  . Cerebrovascular disease 07/2010    TIA; carotid ultrasound in 07/2010-significant bilateral plaque without focal internal carotid artery stenosis; MRI -encephalomalacia left temporal and right temporal lobes; small inferior right cerebellar infarct; small vessel disease  . Hyperlipidemia     adverse reactions to statins and niacin  . History of noncompliance with medical treatment   . Aortic stenosis 2007    Very mild  . Hepatic steatosis   . Diabetes mellitus     no insulin; A1c of 6.6 in 2005  . Atrial fibrillation     Paroxysmal; Echocardiogram in 2007-normal EF; mild LVH; left atrial enlargement; mild stenosis and minimal AI; negative stress nuclear study in 2008  . Exertional dyspnea   . Degenerative joint disease     feet and legs  . Dizziness     occurs daily,especially in am  . Gastroesophageal reflux disease     Past Surgical History  Procedure Date  . Rotator cuff repair     Right  .  Lipoma excision 1980  . Urethral stricture dilatation 1980s  . Orif ankle fracture 2000    Right  . Colonoscopy 2002  . Transurethral resection of prostate 09/2011  . Colonoscopy 01/19/2012    Procedure: COLONOSCOPY;  Surgeon: Malissa Hippo, MD;  Location: [facility];  Service: Endoscopy;  Laterality: N/A;  1030    WUJ:WJXBJY of systems complete and found to be negative unless listed above PHYSICAL EXAM BP 123/72  Pulse 99  Resp 18  Ht 6\' 1"  (1.854 m)  Wt 255 lb (115.667 kg)  BMI 33.64 kg/m2  General: Well developed, well nourished, in no acute distress Head: Eyes PERRLA, No xanthomas.   Normal cephalic and atramatic  Lungs: Clear bilaterally to auscultation and percussion. Heart: HRRR S1 S2, with 1/6 systolic murmur. Pulses are 2+ & equal.            No carotid bruit. No JVD.  No abdominal  bruits. No femoral bruits. Abdomen: Bowel sounds are positive, abdomen soft and non-tender without masses or                  Hernia's noted. Msk:  Back normal, normal gait. Normal strength and tone for age. Extremities: No clubbing, cyanosis or edema. Decreased pulse in the left lower extremity, doppler pulse is thready at PT and DP. No femoral bruits are noted. Good DP on the right.   Neuro: Alert and oriented X 3. Psych:  Good affect, responds appropriately    ASSESSMENT AND PLAN

## 2012-04-15 ENCOUNTER — Encounter: Payer: Self-pay | Admitting: Cardiology

## 2012-04-16 ENCOUNTER — Other Ambulatory Visit: Payer: Self-pay

## 2012-04-16 DIAGNOSIS — I6529 Occlusion and stenosis of unspecified carotid artery: Secondary | ICD-10-CM

## 2012-04-20 ENCOUNTER — Ambulatory Visit (INDEPENDENT_AMBULATORY_CARE_PROVIDER_SITE_OTHER): Payer: Medicare Other

## 2012-04-20 ENCOUNTER — Encounter (HOSPITAL_COMMUNITY)
Admission: RE | Admit: 2012-04-20 | Discharge: 2012-04-20 | Disposition: A | Discharge status: Home / Self Care | Payer: Medicare Other | Source: Ambulatory Visit | Attending: Adult Health | Admitting: Adult Health

## 2012-04-20 ENCOUNTER — Encounter (HOSPITAL_COMMUNITY): Payer: Self-pay | Admitting: Cardiology

## 2012-04-20 ENCOUNTER — Encounter (HOSPITAL_COMMUNITY): Payer: Self-pay

## 2012-04-20 DIAGNOSIS — I2510 Atherosclerotic heart disease of native coronary artery without angina pectoris: Secondary | ICD-10-CM

## 2012-04-20 DIAGNOSIS — R5383 Other fatigue: Secondary | ICD-10-CM | POA: Insufficient documentation

## 2012-04-20 DIAGNOSIS — R0989 Other specified symptoms and signs involving the circulatory and respiratory systems: Secondary | ICD-10-CM | POA: Insufficient documentation

## 2012-04-20 DIAGNOSIS — R5381 Other malaise: Secondary | ICD-10-CM | POA: Diagnosis not present

## 2012-04-20 DIAGNOSIS — R0609 Other forms of dyspnea: Secondary | ICD-10-CM | POA: Diagnosis not present

## 2012-04-20 DIAGNOSIS — I4891 Unspecified atrial fibrillation: Secondary | ICD-10-CM | POA: Diagnosis not present

## 2012-04-20 DIAGNOSIS — I359 Nonrheumatic aortic valve disorder, unspecified: Secondary | ICD-10-CM | POA: Insufficient documentation

## 2012-04-20 DIAGNOSIS — R0602 Shortness of breath: Secondary | ICD-10-CM

## 2012-04-20 DIAGNOSIS — Z79899 Other long term (current) drug therapy: Secondary | ICD-10-CM | POA: Diagnosis not present

## 2012-04-20 DIAGNOSIS — R300 Dysuria: Secondary | ICD-10-CM | POA: Diagnosis not present

## 2012-04-20 HISTORY — DX: Unspecified asthma, uncomplicated: J45.909

## 2012-04-20 HISTORY — DX: Disorder of kidney and ureter, unspecified: N28.9

## 2012-04-20 MED ORDER — TECHNETIUM TC 99M TETROFOSMIN IV KIT
30.0000 | PACK | Freq: Once | INTRAVENOUS | Status: AC | PRN
  Administered 2012-04-20: 30 via INTRAVENOUS

## 2012-04-20 MED ORDER — TECHNETIUM TC 99M TETROFOSMIN IV KIT
10.0000 | PACK | Freq: Once | INTRAVENOUS | Status: AC | PRN
  Administered 2012-04-20: 9 via INTRAVENOUS

## 2012-04-20 NOTE — Progress Notes (Signed)
Stress Lab Nurses Notes - [name] [name] 04/20/2012 Reason for doing test: CAD and Dyspnea Type of test: Steffanie Dunn Nurse performing test: Parke Poisson, RN Nuclear Medicine Tech: Lyndel Pleasure Echo Tech: Not Applicable MD performing test: R. Dietrich Pates Family MD: Lilyan Punt Test explained and consent signed: yes IV started: 22g jelco, Saline lock flushed, No redness or edema and Saline lock started in radiology Symptoms: none Treatment/Intervention: None Reason test stopped: protocol completed After recovery IV was: Discontinued via X-ray tech and No redness or edema Patient to return to Nuc. Med at : 12:15 Patient discharged: Home Patient's Condition upon discharge was: stable Comments: During test BP 102/50 & HR 122.  Recovery BP 112/58 & HR 112.  Symptoms resolved in recovery. [name]

## 2012-04-21 DIAGNOSIS — R0989 Other specified symptoms and signs involving the circulatory and respiratory systems: Secondary | ICD-10-CM | POA: Diagnosis not present

## 2012-04-21 DIAGNOSIS — R0609 Other forms of dyspnea: Secondary | ICD-10-CM | POA: Diagnosis not present

## 2012-04-21 DIAGNOSIS — I509 Heart failure, unspecified: Secondary | ICD-10-CM | POA: Diagnosis not present

## 2012-04-21 DIAGNOSIS — R609 Edema, unspecified: Secondary | ICD-10-CM | POA: Diagnosis not present

## 2012-04-21 DIAGNOSIS — E119 Type 2 diabetes mellitus without complications: Secondary | ICD-10-CM | POA: Diagnosis not present

## 2012-04-24 ENCOUNTER — Encounter: Payer: Self-pay | Admitting: Vascular Surgery

## 2012-04-25 ENCOUNTER — Encounter: Payer: Self-pay | Admitting: Vascular Surgery

## 2012-04-25 ENCOUNTER — Ambulatory Visit (INDEPENDENT_AMBULATORY_CARE_PROVIDER_SITE_OTHER): Payer: Medicare Other | Admitting: Vascular Surgery

## 2012-04-25 ENCOUNTER — Other Ambulatory Visit (INDEPENDENT_AMBULATORY_CARE_PROVIDER_SITE_OTHER): Payer: Medicare Other | Admitting: *Deleted

## 2012-04-25 VITALS — BP 136/69 | HR 101 | Temp 97.5°F | Resp 12 | Ht 73.0 in | Wt 254.0 lb

## 2012-04-25 DIAGNOSIS — I6529 Occlusion and stenosis of unspecified carotid artery: Secondary | ICD-10-CM

## 2012-04-25 HISTORY — DX: Occlusion and stenosis of unspecified carotid artery: I65.29

## 2012-04-25 NOTE — Assessment & Plan Note (Signed)
Carotid duplex scan in our office does not show any evidence of carotid disease. In addition he is asymptomatic from that standpoint. I have recommended a follow up carotid duplex scan in 1 year now see him back at that time. He knows to call sooner if he has problems. If his carotid duplex scan is normal at that time I do not think he will need routine follow up studies. In addition the patient has no evidence of vertebral basilar insufficiency. Arm pressures are equal and he has antegrade flow in both vertebral arteries.

## 2012-04-25 NOTE — Progress Notes (Signed)
[facility]  Patient name: [name] [name] MRN: [id number] DOB: [birth year] Sex: male  REASON FOR CONSULT: bilateral carotid disease  HPI: [name] [name] is a 78 y.o. male who experienced some blurred vision in was generally not feeling well. Part of his workup included a carotid duplex scan which was done at [facility] on 04/06/2012. This suggested bilateral 50-69% carotid stenoses. He was sent for vascular consultation. Of note he is right-handed. He denies any history of stroke, TIAs, expressive or receptive aphasia. He has had some blurred vision in both eyes but I do not get any history consistent with classic amaurosis fugax.  He states that lately he has been weak in general with no specific complaints other than that. His been going on for approximately 3 weeks.  He has a history of atrial fibrillation and is on Coumadin. He is not on aspirin.  Past Medical History  Diagnosis Date  . Hypertension     Borderline  . Cerebrovascular disease 07/2010    TIA; carotid ultrasound in 07/2010-significant bilateral plaque without focal internal carotid artery stenosis; MRI -encephalomalacia left temporal and right temporal lobes; small inferior right cerebellar infarct; small vessel disease  . Hyperlipidemia     adverse reactions to statins and niacin  . History of noncompliance with medical treatment   . Aortic stenosis 2007    Very mild  . Hepatic steatosis   . Diabetes mellitus     no insulin; A1c of 6.6 in 2005  . Atrial fibrillation     Paroxysmal; Echocardiogram in 2007-normal EF; mild LVH; left atrial enlargement; mild stenosis and minimal AI; negative stress nuclear study in 2008  . Exertional dyspnea   . Degenerative joint disease     feet and legs  . Dizziness     occurs daily,especially in am  . Gastroesophageal reflux disease   . Renal insufficiency   . Asthma   . Irregular heartbeat   . Peripheral vascular disease     Family History    Problem Relation Age of Onset  . Stroke Mother   . Diabetes Father   . Colon cancer Neg Hx     SOCIAL HISTORY: History  Substance Use Topics  . Smoking status: Former Smoker -- 1.0 packs/day for 20 years    Types: Cigarettes    Quit date: 04/25/1992  . Smokeless tobacco: Former Neurosurgeon    Types: Chew    Quit date: 12/16/2011  . Alcohol Use: No    Allergies  Allergen Reactions  . Cholestatin   . Lipitor (Atorvastatin Calcium) Other (See Comments)    myalgias  . Ranitidine Other (See Comments)    Chest discomfort  . Simvastatin Other (See Comments)    Myalgias    Current Outpatient Prescriptions  Medication Sig Dispense Refill  . albuterol (PROVENTIL HFA;VENTOLIN HFA) 108 (90 BASE) MCG/ACT inhaler Inhale 2 puffs into the lungs as needed.      . diclofenac (VOLTAREN) 75 MG EC tablet Take 75 mg by mouth daily.      [name] escitalopram (LEXAPRO) 10 MG tablet Take 10 mg by mouth daily.      . fish oil-omega-3 fatty acids 1000 MG capsule Take 1 capsule (1 g total) by mouth 2 (two) times daily.  30 capsule  0  . fluticasone (FLONASE) 50 MCG/ACT nasal spray Place 1 spray into the nose daily as needed. Allergies       . furosemide (LASIX) 20 MG tablet Take 20-40 mg  by mouth daily. Take two tablets by mouth on day 1, then take one tablet thereafter as directed by physician. (Two tablets (40mg ) were taken today as initial dose as directed by physician)      . glyBURIDE micronized (GLYNASE) 3 MG tablet Take 3 mg by mouth daily with breakfast.       . HYDROcodone-acetaminophen (NORCO) 10-325 MG per tablet Take 1 tablet by mouth every 6 (six) hours as needed.      [name] levofloxacin (LEVAQUIN) 500 MG tablet Take 500 mg by mouth daily.      . metoprolol succinate (TOPROL-XL) 25 MG 24 hr tablet Take 25 mg by mouth every morning.       . potassium chloride (K-DUR) 10 MEQ tablet Take 20 mEq by mouth daily.      [name] warfarin (COUMADIN) 5 MG tablet Take 5 mg by mouth every evening.       . zolpidem  (AMBIEN) 10 MG tablet Take 10 mg by mouth at bedtime as needed. For sleep        REVIEW OF SYSTEMS: Arly.Keller ] denotes positive finding; [  ] denotes negative finding  CARDIOVASCULAR:  [ ]  chest pain   [ ]  chest pressure   [ ]  palpitations   Arly.Keller ] orthopnea   Arly.Keller ] dyspnea on exertion   [ ]  claudication   [ ]  rest pain   [ ]  DVT   [ ]  phlebitis PULMONARY:   [ ]  productive cough   [ ]  asthma   [ ]  wheezing NEUROLOGIC:   [ ]  weakness  [ ]  paresthesias  [ ]  aphasia  [ ]  amaurosis  Arly.Keller ] dizziness HEMATOLOGIC:   [ ]  bleeding problems   [ ]  clotting disorders MUSCULOSKELETAL:  [ ]  joint pain   [ ]  joint swelling Arly.Keller ] leg swelling GASTROINTESTINAL: [ ]   blood in stool  [ ]   hematemesis GENITOURINARY:  [ ]   dysuria  [ ]   hematuria PSYCHIATRIC:  Arly.Keller ] history of major depression INTEGUMENTARY:  [ ]  rashes  Arly.Keller ] ulcers CONSTITUTIONAL:  [ ]  fever   [ ]  chills  PHYSICAL EXAM: Filed Vitals:   04/25/12 1126 04/25/12 1133  BP: 118/77 136/69  Pulse: 92 101  Temp: 97.5 F (36.4 C)   TempSrc: Oral   Resp:  12  Height: 6\' 1"  (1.854 m)   Weight: 254 lb (115.214 kg)   SpO2: 98%    Body mass index is 33.51 kg/(m^2). GENERAL: The patient is a well-nourished male, in no acute distress. The vital signs are documented above. CARDIOVASCULAR: There is a regular rate and rhythm without significant murmur appreciated. I do not detect carotid bruits. He has palpable femoral popliteal and pedal pulses bilaterally. He has mild bilateral lower extremity swelling. PULMONARY: There is good air exchange bilaterally without wheezing or rales. ABDOMEN: Soft and non-tender with normal pitched bowel sounds. I cannot palpate an aneurysm although his abdomen is large and difficult to assess. MUSCULOSKELETAL: There are no major deformities or cyanosis. NEUROLOGIC: No focal weakness or paresthesias are detected. SKIN: There are no ulcers or rashes noted. PSYCHIATRIC: The patient has a normal affect.  DATA:  Lab Results    Component Value Date   WBC 15.7* 04/12/2012   HGB 12.6* 04/12/2012   HCT 36.1* 04/12/2012   MCV 87.8 04/12/2012   PLT 461* 04/12/2012   Lab Results  Component Value Date   NA 130* 04/12/2012   K 3.6 04/12/2012   CL 92*  04/12/2012   CO2 27 04/12/2012   Lab Results  Component Value Date   CREATININE 0.68 04/12/2012   Lab Results  Component Value Date   INR 2.11* 04/03/2012   INR 1.10 09/27/2011   INR 2.20* 09/20/2011   I have reviewed his duplex scan from [facility] which by velocity criteria does suggest bilateral 50-69% carotid stenoses.  I have independently interpreted her carotid duplex scan done in our office. We were unable to obtain the same velocities obtained at [facility]. By her duplex he did not have any significant carotid disease on either side. He does have an external carotid artery stenosis on the right which was also seen there. Both vertebral arteries had antegrade flow.  EKG shows atrial flutter ablation  MEDICAL ISSUES:  Occlusion and stenosis of carotid artery without mention of cerebral infarction Carotid duplex scan in our office does not show any evidence of carotid disease. In addition he is asymptomatic from that standpoint. I have recommended a follow up carotid duplex scan in 1 year now see him back at that time. He knows to call sooner if he has problems. If his carotid duplex scan is normal at that time I do not think he will need routine follow up studies. In addition the patient has no evidence of vertebral basilar insufficiency. Arm pressures are equal and he has antegrade flow in both vertebral arteries.   Aira Sallade S [facility] [place] [facility]: [phone]

## 2012-04-27 ENCOUNTER — Ambulatory Visit (INDEPENDENT_AMBULATORY_CARE_PROVIDER_SITE_OTHER): Payer: Medicare Other | Admitting: Adult Health

## 2012-04-27 ENCOUNTER — Encounter: Payer: Self-pay | Admitting: Adult Health

## 2012-04-27 VITALS — BP 105/69 | HR 100 | Resp 18 | Ht 73.0 in | Wt 240.0 lb

## 2012-04-27 DIAGNOSIS — R0609 Other forms of dyspnea: Secondary | ICD-10-CM

## 2012-04-27 DIAGNOSIS — R0989 Other specified symptoms and signs involving the circulatory and respiratory systems: Secondary | ICD-10-CM | POA: Diagnosis not present

## 2012-04-27 NOTE — Patient Instructions (Signed)
**Note De-identified  Obfuscation** Your physician recommends that you continue on your current medications as directed. Please refer to the Current Medication list given to you today.  Your physician recommends that you schedule a follow-up appointment in: 1 year  

## 2012-04-27 NOTE — Assessment & Plan Note (Signed)
Stress test was completed, and found to be negative for stress induced ECG abnormalities, Normal LV fx. There was a minimal area at the base of the inferior wall with apparently abnormal perfusion, likely the result of attenuation artifact. I have explained this to the patient and his son who is in attendance. No further cardiac testing is planned. I will see him in one year.

## 2012-04-27 NOTE — Progress Notes (Signed)
HPI: [name] is a 78 y/o patient of Dr. Dietrich Pates we are following for known history of CVA, atrial fibrillation, mild AoV stenosis, hypertension, with multiple somatic complaints. He has recently been seen by Dr. Gerda Diss after experiencing multiple tests for several complaints of fatigue, DOE and leg pain. He has a history of spinal stenosis, GERD and diabetes.   Echocardiogram was completed demonstrating Normal EF of 50-55%, only mild AoV stenosis, with moderately dilated left atrium. He has had PFT's in the past that were found to be normal. He was not anemic. Abdominal ultrasound was found to be normal without evidence of AAA. Carotid ultrasound which was abnormal for Right external carotid stenosis. He is to see the vascular surgeon next month.    He continues to insist that he is not feeling well and cannot walk more that 30 feet without having to stop and catch his breath and rest. He has no energy. He also complains of right leg pain with walking.  On last visit, after multiple questions and time spent with the patient, I ordered a dobutamine stress test. He is here to discuss the results.   Allergies  Allergen Reactions  . Cholestatin   . Lipitor (Atorvastatin Calcium) Other (See Comments)    myalgias  . Ranitidine Other (See Comments)    Chest discomfort  . Simvastatin Other (See Comments)    Myalgias    Current Outpatient Prescriptions  Medication Sig Dispense Refill  . albuterol (PROVENTIL HFA;VENTOLIN HFA) 108 (90 BASE) MCG/ACT inhaler Inhale 2 puffs into the lungs as needed.      . diclofenac (VOLTAREN) 75 MG EC tablet Take 75 mg by mouth daily.      [name] escitalopram (LEXAPRO) 10 MG tablet Take 10 mg by mouth daily.      . fish oil-omega-3 fatty acids 1000 MG capsule Take 1 capsule (1 g total) by mouth 2 (two) times daily.  30 capsule  0  . fluticasone (FLONASE) 50 MCG/ACT nasal spray Place 1 spray into the nose daily as needed. Allergies       . furosemide (LASIX) 20 MG  tablet Take 20-40 mg by mouth daily. Take two tablets by mouth on day 1, then take one tablet thereafter as directed by physician. (Two tablets (40mg ) were taken today as initial dose as directed by physician)      . glyBURIDE micronized (GLYNASE) 3 MG tablet Take 3 mg by mouth daily with breakfast.       . HYDROcodone-acetaminophen (NORCO) 10-325 MG per tablet Take 1 tablet by mouth every 6 (six) hours as needed.      [name] levofloxacin (LEVAQUIN) 500 MG tablet Take 500 mg by mouth daily.      . metoprolol succinate (TOPROL-XL) 25 MG 24 hr tablet Take 25 mg by mouth every morning.       . potassium chloride (K-DUR) 10 MEQ tablet Take 20 mEq by mouth daily.      [name] warfarin (COUMADIN) 5 MG tablet Take 5 mg by mouth every evening.       . zolpidem (AMBIEN) 10 MG tablet Take 10 mg by mouth at bedtime as needed. For sleep        Past Medical History  Diagnosis Date  . Hypertension     Borderline  . Cerebrovascular disease 07/2010    TIA; carotid ultrasound in 07/2010-significant bilateral plaque without focal internal carotid artery stenosis; MRI -encephalomalacia left temporal and right temporal lobes; small inferior right cerebellar infarct; small vessel  disease  . Hyperlipidemia     adverse reactions to statins and niacin  . History of noncompliance with medical treatment   . Aortic stenosis 2007    Very mild  . Hepatic steatosis   . Diabetes mellitus     no insulin; A1c of 6.6 in 2005  . Atrial fibrillation     Paroxysmal; Echocardiogram in 2007-normal EF; mild LVH; left atrial enlargement; mild stenosis and minimal AI; negative stress nuclear study in 2008  . Exertional dyspnea   . Degenerative joint disease     feet and legs  . Dizziness     occurs daily,especially in am  . Gastroesophageal reflux disease   . Renal insufficiency   . Asthma   . Irregular heartbeat   . Peripheral vascular disease     Past Surgical History  Procedure Date  . Rotator cuff repair     Right  . Lipoma  excision 1980  . Urethral stricture dilatation 1980s  . Orif ankle fracture 2000    Right  . Colonoscopy 2002  . Transurethral resection of prostate 09/2011  . Colonoscopy 01/19/2012    Procedure: COLONOSCOPY;  Surgeon: Malissa Hippo, MD;  Location: [facility];  Service: Endoscopy;  Laterality: N/A;  1030  . Prostate surgery 12/2011    ZOX:WRUEAV of systems complete and found to be negative unless listed above  PHYSICAL EXAM BP 105/69  Pulse 100  Resp 18  Ht 6\' 1"  (1.854 m)  Wt 240 lb (108.863 kg)  BMI 31.66 kg/m2  General: Well developed, well nourished, in no acute distress Lungs: Mild inspiratory crackles but wheezes or diminished breath sounds. Heart: HRIR S1 S2, without MRG.  Pulses are 2+ & equal.  No carotid bruit. No JVD.  No abdominal bruits. No femoral bruits. Neuro: Alert and oriented X 3. Psych:  Good affect, responds appropriately    ASSESSMENT AND PLAN

## 2012-05-02 DIAGNOSIS — R5381 Other malaise: Secondary | ICD-10-CM | POA: Diagnosis not present

## 2012-05-02 DIAGNOSIS — R42 Dizziness and giddiness: Secondary | ICD-10-CM | POA: Diagnosis not present

## 2012-05-02 DIAGNOSIS — R5383 Other fatigue: Secondary | ICD-10-CM | POA: Diagnosis not present

## 2012-05-02 NOTE — Procedures (Unsigned)
CAROTID DUPLEX EXAM  INDICATION:  Carotid disease.  HISTORY: Diabetes:  Yes. Cardiac:  A-fib. Hypertension:  Yes. Smoking:  Previous. Previous Surgery:  No. CV History:  Complaint of dizziness/fatigue. Amaurosis Fugax No, Paresthesias No, Hemiparesis No                                      RIGHT             LEFT Brachial systolic pressure:         132               128 Brachial Doppler waveforms:         Normal            Normal Vertebral direction of flow:        Antegrade         Antegrade DUPLEX VELOCITIES (cm/sec) CCA peak systolic                   65                55 ECA peak systolic                   425               107 ICA peak systolic                   103               114 ICA end diastolic                   24                32 PLAQUE MORPHOLOGY:                  Heterogeneous     Heterogeneous PLAQUE AMOUNT:                      Mild              Mild PLAQUE LOCATION:                    ICA/ ECA          ICA/ECA  IMPRESSION: 1. Doppler velocity suggests 1-39% stenosis of the bilateral proximal     internal carotid arteries. 2. Right external carotid artery stenosis noted.   ___________________________________________ Di Kindle. Edilia Bo, M.D.  CH/MEDQ  D:  04/27/2012  T:  04/27/2012  Job:  [id number]

## 2012-05-03 DIAGNOSIS — E119 Type 2 diabetes mellitus without complications: Secondary | ICD-10-CM | POA: Diagnosis not present

## 2012-05-03 DIAGNOSIS — H5310 Unspecified subjective visual disturbances: Secondary | ICD-10-CM | POA: Diagnosis not present

## 2012-05-03 DIAGNOSIS — H35039 Hypertensive retinopathy, unspecified eye: Secondary | ICD-10-CM | POA: Diagnosis not present

## 2012-05-04 ENCOUNTER — Telehealth: Payer: Self-pay | Admitting: *Deleted

## 2012-05-04 NOTE — Telephone Encounter (Signed)
I spoke with [name] [name] on 05/03/12 he said [name] [name] had just seen Dr Dione Booze because he has had over the last few days episodes where he can not see the TV for 3-5 mins. [name] [name] said he thought Dr Dione Booze was going to send [place] & Dr Torrie Mayers a report. As of this moment I have not received anything. I spoke with Dr Edilia Bo and set him up for another appt with him next Wed. I told [name] [name] that if had more of these episodes and they lasted longer to go to the ER.[name] [name] said he understood.

## 2012-05-08 ENCOUNTER — Other Ambulatory Visit: Payer: Self-pay | Admitting: Family Medicine

## 2012-05-08 DIAGNOSIS — H539 Unspecified visual disturbance: Secondary | ICD-10-CM

## 2012-05-09 ENCOUNTER — Ambulatory Visit: Payer: Medicare Other | Admitting: Vascular Surgery

## 2012-05-11 ENCOUNTER — Ambulatory Visit (HOSPITAL_COMMUNITY)
Admission: RE | Admit: 2012-05-11 | Discharge: 2012-05-11 | Disposition: A | Discharge status: Home / Self Care | Payer: Medicare Other | Source: Ambulatory Visit | Attending: Family Medicine | Admitting: Family Medicine

## 2012-05-11 DIAGNOSIS — I658 Occlusion and stenosis of other precerebral arteries: Secondary | ICD-10-CM | POA: Diagnosis not present

## 2012-05-11 DIAGNOSIS — H539 Unspecified visual disturbance: Secondary | ICD-10-CM | POA: Diagnosis not present

## 2012-05-11 DIAGNOSIS — I6529 Occlusion and stenosis of unspecified carotid artery: Secondary | ICD-10-CM | POA: Insufficient documentation

## 2012-05-11 MED ORDER — IOHEXOL 350 MG/ML SOLN
100.0000 mL | Freq: Once | INTRAVENOUS | Status: AC | PRN
  Administered 2012-05-11: 100 mL via INTRAVENOUS

## 2012-05-21 DIAGNOSIS — IMO0001 Reserved for inherently not codable concepts without codable children: Secondary | ICD-10-CM | POA: Diagnosis not present

## 2012-05-21 DIAGNOSIS — R5381 Other malaise: Secondary | ICD-10-CM | POA: Diagnosis not present

## 2012-05-21 DIAGNOSIS — R5383 Other fatigue: Secondary | ICD-10-CM | POA: Diagnosis not present

## 2012-05-25 DIAGNOSIS — R5381 Other malaise: Secondary | ICD-10-CM | POA: Diagnosis not present

## 2012-05-25 DIAGNOSIS — E119 Type 2 diabetes mellitus without complications: Secondary | ICD-10-CM | POA: Diagnosis not present

## 2012-05-25 DIAGNOSIS — R51 Headache: Secondary | ICD-10-CM | POA: Diagnosis not present

## 2012-05-31 ENCOUNTER — Telehealth: Payer: Self-pay | Admitting: Cardiology

## 2012-05-31 DIAGNOSIS — I4891 Unspecified atrial fibrillation: Secondary | ICD-10-CM | POA: Diagnosis not present

## 2012-05-31 DIAGNOSIS — D689 Coagulation defect, unspecified: Secondary | ICD-10-CM | POA: Diagnosis not present

## 2012-05-31 DIAGNOSIS — M316 Other giant cell arteritis: Secondary | ICD-10-CM | POA: Diagnosis not present

## 2012-05-31 DIAGNOSIS — E119 Type 2 diabetes mellitus without complications: Secondary | ICD-10-CM | POA: Diagnosis not present

## 2012-05-31 NOTE — Telephone Encounter (Signed)
Please review and advise.

## 2012-05-31 NOTE — Telephone Encounter (Signed)
Yes he can proceed for CEA. Had recent dobutamine stress test and was negative for ischemia. Low risk for cardiac event during surgery.

## 2012-05-31 NOTE — Telephone Encounter (Signed)
DR Gerda Diss CALLED TO SEE IF WE WOULD DO A SURGICAL CLEARANCE ON PT W/O ANOTHER F/U. PT WAS JUST SEEN IN June FOR F/U MYOVIEW.  PT NEEDS TO HAVE RIGHT ENDARTERECTOMY DONE. DR Edilia Bo WITH [facility] WILL DO SURGERY, USING GENERAL ANASTASIA. iT HAS NOT BEEN SCHEDULED YET.    [facility] PHONE # [phone] [facility] PHONE # [phone]

## 2012-06-01 DIAGNOSIS — M316 Other giant cell arteritis: Secondary | ICD-10-CM | POA: Diagnosis not present

## 2012-06-01 NOTE — Telephone Encounter (Signed)
Advised Dr Gerda Diss of recommendations

## 2012-06-05 ENCOUNTER — Encounter: Payer: Self-pay | Admitting: Vascular Surgery

## 2012-06-06 ENCOUNTER — Encounter: Payer: Self-pay | Admitting: Vascular Surgery

## 2012-06-06 ENCOUNTER — Ambulatory Visit (INDEPENDENT_AMBULATORY_CARE_PROVIDER_SITE_OTHER): Payer: Medicare Other | Admitting: Vascular Surgery

## 2012-06-06 VITALS — BP 132/74 | HR 94 | Temp 97.8°F | Resp 12 | Ht 73.0 in | Wt 231.0 lb

## 2012-06-06 DIAGNOSIS — I6529 Occlusion and stenosis of unspecified carotid artery: Secondary | ICD-10-CM | POA: Diagnosis not present

## 2012-06-06 NOTE — Assessment & Plan Note (Signed)
This patient has bilateral moderate carotid artery stenoses. I think his CT scan oftentimes overestimate the severity of the stenosis. However clearly he has some irregular plaque on both sides. However, given that his left I blurred vision has resolved since he has been on prednisone I'm not convinced that these symptoms were related to his left carotid stenosis. At this point I would still consider the his carotid disease asymptomatic. We would typically not consider carotid endarterectomy in an asymptomatic patient unless the stenosis progressed to greater than 80%. Certainly if he developed new symptoms such as focal weakness or paresthesias or amaurosis fugax we would have to reconsider addressing the carotid disease. However this point I would simply recommend a follow carotid duplex scan in 6 months and I'll see him back at that time. The other consideration would be to add 81 mg of aspirin to his medications. He is on Coumadin slightly that decision to Dr. Adelina Mings. Plan on seeing him back in 6 months. He knows to call sooner if he develops any new neurologic symptoms.

## 2012-06-06 NOTE — Progress Notes (Signed)
[facility]  Patient name: [name] [name] MRN: [id number] DOB: 1931/12/18 Sex: male  REASON FOR VISIT: follow up of carotid disease. Referred by Dr. Lilyan Punt  HPI: [name] [name] is a 78 y.o. male who I last saw on 04/25/2012. At that time he had been experiencing some occasional blurred vision in his left eye. He had a carotid duplex scan in and [facility] which suggested bilateral 50-69% carotid stenoses. However, duplex scan in our office did not show the same velocities. He has significant external carotid artery stenosis on the right but only mild internal carotid artery disease bilaterally.I was not convinced that his symptoms could be attributed to his mild carotid disease.  However he continued to have some symptoms and this prompted a CT angio of of the and neck which showed irregular carotid plaques bilaterally. The CT scan did suggest a 60% right internal carotid artery stenosis and a 70% left internal carotid artery stenosis. This reason I felt he should be reevaluated for possible carotid endarterectomy.  In the meantime, he has been evaluated for possible temporal arteritis and has been started on prednisone. He been having pain over the left eye associated with his visual disturbance. He states that since he has been on prednisone his symptoms have resolved. He's had no further blurred vision in either eye. I have never get any history consistent with amaurosis fugax. He said no focal weakness or paresthesias. He said no history of stroke.   REVIEW OF SYSTEMS: Arly.Keller ] denotes positive finding; [  ] denotes negative finding  CARDIOVASCULAR:  [ ]  chest pain   [ ]  dyspnea on exertion    CONSTITUTIONAL:  [ ]  fever   [ ]  chills  PHYSICAL EXAM: Filed Vitals:   06/06/12 1040 06/06/12 1042  BP: 99/82 132/74  Pulse: 102 94  Temp: 97.8 F (36.6 C)   TempSrc: Oral   Resp:  12  Height: 6\' 1"  (1.854 m)   Weight: 231 lb (104.781 kg)   SpO2: 99%     Body mass index is 30.48 kg/(m^2). GENERAL: The patient is a well-nourished male, in no acute distress. The vital signs are documented above. CARDIOVASCULAR: There is a regular rate and rhythm. I do not detect carotid bruits. PULMONARY: There is good air exchange bilaterally without wheezing or rales. He has no focal weakness or paresthesias.  MEDICAL ISSUES:  Occlusion and stenosis of carotid artery without mention of cerebral infarction This patient has bilateral moderate carotid artery stenoses. I think his CT scan oftentimes overestimate the severity of the stenosis. However clearly he has some irregular plaque on both sides. However, given that his left I blurred vision has resolved since he has been on prednisone I'm not convinced that these symptoms were related to his left carotid stenosis. At this point I would still consider the his carotid disease asymptomatic. We would typically not consider carotid endarterectomy in an asymptomatic patient unless the stenosis progressed to greater than 80%. Certainly if he developed new symptoms such as focal weakness or paresthesias or amaurosis fugax we would have to reconsider addressing the carotid disease. However this point I would simply recommend a follow carotid duplex scan in 6 months and I'll see him back at that time. The other consideration would be to add 81 mg of aspirin to his medications. He is on Coumadin slightly that decision to Dr. Adelina Mings. Plan on seeing him back in 6 months. He knows to call sooner if  he develops any new neurologic symptoms.   Verba Ainley S [facility] [place] Beeper: [phone]   '

## 2012-06-07 DIAGNOSIS — D689 Coagulation defect, unspecified: Secondary | ICD-10-CM | POA: Diagnosis not present

## 2012-06-07 DIAGNOSIS — I4891 Unspecified atrial fibrillation: Secondary | ICD-10-CM | POA: Diagnosis not present

## 2012-06-14 DIAGNOSIS — M316 Other giant cell arteritis: Secondary | ICD-10-CM | POA: Diagnosis not present

## 2012-06-20 ENCOUNTER — Ambulatory Visit: Payer: Medicare Other | Admitting: Vascular Surgery

## 2012-06-22 DIAGNOSIS — D235 Other benign neoplasm of skin of trunk: Secondary | ICD-10-CM | POA: Diagnosis not present

## 2012-06-22 DIAGNOSIS — Z85828 Personal history of other malignant neoplasm of skin: Secondary | ICD-10-CM | POA: Diagnosis not present

## 2012-06-22 DIAGNOSIS — E119 Type 2 diabetes mellitus without complications: Secondary | ICD-10-CM | POA: Diagnosis not present

## 2012-06-22 DIAGNOSIS — I4891 Unspecified atrial fibrillation: Secondary | ICD-10-CM | POA: Diagnosis not present

## 2012-06-22 DIAGNOSIS — C4442 Squamous cell carcinoma of skin of scalp and neck: Secondary | ICD-10-CM | POA: Diagnosis not present

## 2012-07-06 DIAGNOSIS — I4891 Unspecified atrial fibrillation: Secondary | ICD-10-CM | POA: Diagnosis not present

## 2012-07-12 DIAGNOSIS — M316 Other giant cell arteritis: Secondary | ICD-10-CM | POA: Diagnosis not present

## 2012-07-30 DIAGNOSIS — C44201 Unspecified malignant neoplasm of skin of unspecified ear and external auricular canal: Secondary | ICD-10-CM | POA: Diagnosis not present

## 2012-07-30 DIAGNOSIS — C4442 Squamous cell carcinoma of skin of scalp and neck: Secondary | ICD-10-CM | POA: Diagnosis not present

## 2012-08-06 DIAGNOSIS — Z23 Encounter for immunization: Secondary | ICD-10-CM | POA: Diagnosis not present

## 2012-08-06 DIAGNOSIS — I4891 Unspecified atrial fibrillation: Secondary | ICD-10-CM | POA: Diagnosis not present

## 2012-08-08 DIAGNOSIS — H2510 Age-related nuclear cataract, unspecified eye: Secondary | ICD-10-CM | POA: Diagnosis not present

## 2012-08-08 DIAGNOSIS — H25019 Cortical age-related cataract, unspecified eye: Secondary | ICD-10-CM | POA: Diagnosis not present

## 2012-08-08 DIAGNOSIS — E119 Type 2 diabetes mellitus without complications: Secondary | ICD-10-CM | POA: Diagnosis not present

## 2012-08-16 DIAGNOSIS — M316 Other giant cell arteritis: Secondary | ICD-10-CM | POA: Diagnosis not present

## 2012-09-05 DIAGNOSIS — R5383 Other fatigue: Secondary | ICD-10-CM | POA: Diagnosis not present

## 2012-09-05 DIAGNOSIS — E119 Type 2 diabetes mellitus without complications: Secondary | ICD-10-CM | POA: Diagnosis not present

## 2012-09-05 DIAGNOSIS — I4891 Unspecified atrial fibrillation: Secondary | ICD-10-CM | POA: Diagnosis not present

## 2012-09-05 DIAGNOSIS — R5381 Other malaise: Secondary | ICD-10-CM | POA: Diagnosis not present

## 2012-09-05 DIAGNOSIS — R42 Dizziness and giddiness: Secondary | ICD-10-CM | POA: Diagnosis not present

## 2012-09-10 DIAGNOSIS — R5381 Other malaise: Secondary | ICD-10-CM | POA: Diagnosis not present

## 2012-09-10 DIAGNOSIS — R5383 Other fatigue: Secondary | ICD-10-CM | POA: Diagnosis not present

## 2012-09-10 DIAGNOSIS — Z79899 Other long term (current) drug therapy: Secondary | ICD-10-CM | POA: Diagnosis not present

## 2012-09-13 DIAGNOSIS — M316 Other giant cell arteritis: Secondary | ICD-10-CM | POA: Diagnosis not present

## 2012-10-03 DIAGNOSIS — I4891 Unspecified atrial fibrillation: Secondary | ICD-10-CM | POA: Diagnosis not present

## 2012-10-16 DIAGNOSIS — M75100 Unspecified rotator cuff tear or rupture of unspecified shoulder, not specified as traumatic: Secondary | ICD-10-CM | POA: Diagnosis not present

## 2012-10-16 DIAGNOSIS — M316 Other giant cell arteritis: Secondary | ICD-10-CM | POA: Diagnosis not present

## 2012-10-16 DIAGNOSIS — M25519 Pain in unspecified shoulder: Secondary | ICD-10-CM | POA: Diagnosis not present

## 2012-10-18 DIAGNOSIS — E1149 Type 2 diabetes mellitus with other diabetic neurological complication: Secondary | ICD-10-CM | POA: Diagnosis not present

## 2012-10-18 DIAGNOSIS — E119 Type 2 diabetes mellitus without complications: Secondary | ICD-10-CM | POA: Diagnosis not present

## 2012-10-31 DIAGNOSIS — M25519 Pain in unspecified shoulder: Secondary | ICD-10-CM | POA: Diagnosis not present

## 2012-11-02 DIAGNOSIS — E119 Type 2 diabetes mellitus without complications: Secondary | ICD-10-CM | POA: Diagnosis not present

## 2012-11-02 DIAGNOSIS — I4891 Unspecified atrial fibrillation: Secondary | ICD-10-CM | POA: Diagnosis not present

## 2012-11-21 DIAGNOSIS — M25519 Pain in unspecified shoulder: Secondary | ICD-10-CM | POA: Diagnosis not present

## 2012-11-29 ENCOUNTER — Ambulatory Visit (INDEPENDENT_AMBULATORY_CARE_PROVIDER_SITE_OTHER): Payer: Medicare Other | Admitting: Otolaryngology

## 2012-11-29 DIAGNOSIS — J343 Hypertrophy of nasal turbinates: Secondary | ICD-10-CM

## 2012-11-29 DIAGNOSIS — J310 Chronic rhinitis: Secondary | ICD-10-CM | POA: Diagnosis not present

## 2012-11-29 DIAGNOSIS — D2320 Other benign neoplasm of skin of unspecified ear and external auricular canal: Secondary | ICD-10-CM

## 2012-11-29 DIAGNOSIS — R51 Headache: Secondary | ICD-10-CM | POA: Diagnosis not present

## 2012-11-30 ENCOUNTER — Other Ambulatory Visit: Payer: Self-pay | Admitting: *Deleted

## 2012-11-30 DIAGNOSIS — I6529 Occlusion and stenosis of unspecified carotid artery: Secondary | ICD-10-CM

## 2012-12-06 DIAGNOSIS — M316 Other giant cell arteritis: Secondary | ICD-10-CM | POA: Diagnosis not present

## 2012-12-06 DIAGNOSIS — I4891 Unspecified atrial fibrillation: Secondary | ICD-10-CM | POA: Diagnosis not present

## 2012-12-06 DIAGNOSIS — E119 Type 2 diabetes mellitus without complications: Secondary | ICD-10-CM | POA: Diagnosis not present

## 2012-12-06 DIAGNOSIS — E782 Mixed hyperlipidemia: Secondary | ICD-10-CM | POA: Diagnosis not present

## 2012-12-06 DIAGNOSIS — Z79899 Other long term (current) drug therapy: Secondary | ICD-10-CM | POA: Diagnosis not present

## 2012-12-11 ENCOUNTER — Encounter: Payer: Self-pay | Admitting: Neurosurgery

## 2012-12-12 ENCOUNTER — Ambulatory Visit: Payer: Medicare Other | Admitting: Neurosurgery

## 2012-12-12 ENCOUNTER — Other Ambulatory Visit: Payer: Medicare Other

## 2012-12-17 DIAGNOSIS — M316 Other giant cell arteritis: Secondary | ICD-10-CM | POA: Diagnosis not present

## 2012-12-24 DIAGNOSIS — E119 Type 2 diabetes mellitus without complications: Secondary | ICD-10-CM | POA: Diagnosis not present

## 2013-01-01 DIAGNOSIS — D689 Coagulation defect, unspecified: Secondary | ICD-10-CM | POA: Diagnosis not present

## 2013-01-01 DIAGNOSIS — I4891 Unspecified atrial fibrillation: Secondary | ICD-10-CM | POA: Diagnosis not present

## 2013-01-03 DIAGNOSIS — Z85828 Personal history of other malignant neoplasm of skin: Secondary | ICD-10-CM | POA: Diagnosis not present

## 2013-01-03 DIAGNOSIS — D0420 Carcinoma in situ of skin of unspecified ear and external auricular canal: Secondary | ICD-10-CM | POA: Diagnosis not present

## 2013-01-03 DIAGNOSIS — L570 Actinic keratosis: Secondary | ICD-10-CM | POA: Diagnosis not present

## 2013-01-03 DIAGNOSIS — I872 Venous insufficiency (chronic) (peripheral): Secondary | ICD-10-CM | POA: Diagnosis not present

## 2013-01-09 DIAGNOSIS — R609 Edema, unspecified: Secondary | ICD-10-CM | POA: Diagnosis not present

## 2013-01-09 DIAGNOSIS — E119 Type 2 diabetes mellitus without complications: Secondary | ICD-10-CM | POA: Diagnosis not present

## 2013-01-09 DIAGNOSIS — G609 Hereditary and idiopathic neuropathy, unspecified: Secondary | ICD-10-CM | POA: Diagnosis not present

## 2013-01-12 ENCOUNTER — Encounter: Payer: Self-pay | Admitting: *Deleted

## 2013-01-17 DIAGNOSIS — H61009 Unspecified perichondritis of external ear, unspecified ear: Secondary | ICD-10-CM | POA: Diagnosis not present

## 2013-01-17 DIAGNOSIS — I872 Venous insufficiency (chronic) (peripheral): Secondary | ICD-10-CM | POA: Diagnosis not present

## 2013-01-26 ENCOUNTER — Encounter: Payer: Self-pay | Admitting: *Deleted

## 2013-01-30 ENCOUNTER — Ambulatory Visit (INDEPENDENT_AMBULATORY_CARE_PROVIDER_SITE_OTHER): Payer: Medicare Other | Admitting: Family Medicine

## 2013-01-30 ENCOUNTER — Encounter: Payer: Self-pay | Admitting: Family Medicine

## 2013-01-30 VITALS — BP 130/78 | HR 90 | Wt 266.0 lb

## 2013-01-30 DIAGNOSIS — E119 Type 2 diabetes mellitus without complications: Secondary | ICD-10-CM

## 2013-01-30 DIAGNOSIS — I4891 Unspecified atrial fibrillation: Secondary | ICD-10-CM | POA: Diagnosis not present

## 2013-01-30 LAB — POCT GLYCOSYLATED HEMOGLOBIN (HGB A1C): Hemoglobin A1C: 6.8

## 2013-01-30 LAB — POCT INR: INR: 2.1

## 2013-01-30 MED ORDER — TRAMADOL HCL 50 MG PO TABS: 50.0000 mg | ORAL_TABLET | Freq: Three times a day (TID) | ORAL | Status: DC | PRN

## 2013-01-30 NOTE — Patient Instructions (Signed)
Did INR monthly  Followup here in 3 months for diabetic checkup. Bring all your medicine at that time. End of June would be fine.

## 2013-01-30 NOTE — Progress Notes (Signed)
Subjective:    Patient ID: [name] [name], male    DOB: 12-30-1931, 79 y.o.   MRN: [id number]  Diabetes He presents for his follow-up diabetic visit. He has type 2 diabetes mellitus. The initial diagnosis of diabetes was made 10 years ago. His disease course has been stable. Pertinent negatives for hypoglycemia include no confusion, dizziness or seizures. Associated symptoms include fatigue and foot paresthesias. Pertinent negatives for diabetes include no blurred vision, no chest pain, no foot ulcerations, no polydipsia, no polyphagia and no visual change. There are no hypoglycemic complications. Symptoms are stable. Diabetic complications include heart disease and peripheral neuropathy. Pertinent negatives for diabetic complications include no retinopathy. Risk factors for coronary artery disease include diabetes mellitus, dyslipidemia, hypertension and male sex. Current diabetic treatment includes diet and oral agent (monotherapy). He is compliant with treatment most of the time. His weight is stable. He is following a diabetic and generally healthy diet. Meal planning includes avoidance of concentrated sweets. He rarely participates in exercise. There is no change in his home blood glucose trend. His breakfast blood glucose range is generally 130-140 mg/dl.  Atrial Fibrillation Presents for follow-up visit. Symptoms are negative for chest pain, dizziness, hypertension, hypotension and tachycardia. The symptoms have been stable. Past medical history includes atrial fibrillation. There are no medication compliance problems.   This patient relates that he is having some foot pain he describes as burning sensation nortriptyline seems to be helping a little but he's been using tramadol 3 times a day and it seems to be doing a good job for him he would like to have a prescription so that it covers a larger amount. Patient does try to get eye checkup so on yearly basis and has been advised to do so. Patient  denies any wheezing difficulty breathing shortness of breath today he denies abusing his medications. Denies any strokelike symptoms. He also needs to have his INR checked today.   Review of Systems  Constitutional: Positive for fatigue. Negative for activity change.  HENT: Negative for congestion and facial swelling.   Eyes: Negative for blurred vision.  Respiratory: Negative for cough and chest tightness.   Cardiovascular: Negative for chest pain and leg swelling.  Gastrointestinal: Negative.   Endocrine: Negative for polydipsia and polyphagia.  Neurological: Negative for dizziness, seizures and light-headedness.  Psychiatric/Behavioral: Negative for confusion.       Objective:   Physical Exam  Constitutional: He appears well-nourished.  HENT:  Head: Normocephalic.  Mouth/Throat: No oropharyngeal exudate.  Neck: Normal range of motion. No thyromegaly present.  Cardiovascular: Normal rate and normal heart sounds.   No murmur heard. Pulmonary/Chest: Effort normal and breath sounds normal.  Abdominal: Soft.  Musculoskeletal: Normal range of motion. He exhibits no edema.  Lymphadenopathy:    He has no cervical adenopathy.  Skin: He is not diaphoretic.   Patient's INR looks good. A1c looks very good.       Assessment & Plan:  Atrial fibrillation - Plan: POCT INR, POCT INR  Diabetes - Plan: POCT HgB A1C, POCT HgB A1C   Orders Placed This Encounter  Procedures  . POCT INR    Standing Status: Standing     Number of Occurrences: 52     Standing Expiration Date: 01/30/2014  . POCT HgB A1C    Standing Status: Standing     Number of Occurrences: 4     Standing Expiration Date: 01/30/2014  he is to followup in approximately 3 months but to get his monthly INR.

## 2013-02-16 ENCOUNTER — Other Ambulatory Visit: Payer: Self-pay | Admitting: *Deleted

## 2013-02-16 DIAGNOSIS — Z7901 Long term (current) use of anticoagulants: Secondary | ICD-10-CM

## 2013-02-21 DIAGNOSIS — M25579 Pain in unspecified ankle and joints of unspecified foot: Secondary | ICD-10-CM | POA: Diagnosis not present

## 2013-02-21 DIAGNOSIS — E119 Type 2 diabetes mellitus without complications: Secondary | ICD-10-CM | POA: Diagnosis not present

## 2013-02-21 DIAGNOSIS — E1149 Type 2 diabetes mellitus with other diabetic neurological complication: Secondary | ICD-10-CM | POA: Diagnosis not present

## 2013-03-03 ENCOUNTER — Other Ambulatory Visit: Payer: Self-pay | Admitting: Family Medicine

## 2013-03-05 ENCOUNTER — Ambulatory Visit (INDEPENDENT_AMBULATORY_CARE_PROVIDER_SITE_OTHER): Payer: Medicare Other

## 2013-03-05 DIAGNOSIS — I4891 Unspecified atrial fibrillation: Secondary | ICD-10-CM | POA: Diagnosis not present

## 2013-03-05 DIAGNOSIS — Z7901 Long term (current) use of anticoagulants: Secondary | ICD-10-CM

## 2013-03-05 LAB — POCT INR: INR: 2.9

## 2013-03-19 ENCOUNTER — Other Ambulatory Visit: Payer: Self-pay | Admitting: Family Medicine

## 2013-03-20 NOTE — Telephone Encounter (Signed)
Ok times 4 

## 2013-03-21 ENCOUNTER — Ambulatory Visit (INDEPENDENT_AMBULATORY_CARE_PROVIDER_SITE_OTHER): Payer: Medicare Other | Admitting: Family Medicine

## 2013-03-21 ENCOUNTER — Ambulatory Visit: Payer: Medicare Other | Admitting: Family Medicine

## 2013-03-21 ENCOUNTER — Encounter: Payer: Self-pay | Admitting: Family Medicine

## 2013-03-21 VITALS — BP 120/76 | Temp 97.5°F | Wt 258.8 lb

## 2013-03-21 DIAGNOSIS — J309 Allergic rhinitis, unspecified: Secondary | ICD-10-CM | POA: Diagnosis not present

## 2013-03-21 MED ORDER — METHYLPREDNISOLONE ACETATE 40 MG/ML IJ SUSP
40.0000 mg | Freq: Once | INTRAMUSCULAR | Status: AC
  Administered 2013-03-21: 40 mg via INTRAMUSCULAR

## 2013-03-21 MED ORDER — ZOLPIDEM TARTRATE 10 MG PO TABS: 10.0000 mg | ORAL_TABLET | Freq: Every evening | ORAL | Status: DC | PRN

## 2013-03-21 MED ORDER — FLUTICASONE PROPIONATE 50 MCG/ACT NA SUSP: 1.0000 | Freq: Every day | NASAL | Status: DC | PRN

## 2013-03-21 NOTE — Progress Notes (Signed)
  Subjective:    Patient ID: [name] [name], male    DOB: 04-25-1932, 79 y.o.   MRN: [id number]  HPI Patient head congestion drainage coughing sneezing symptoms over the past couple weeks denies any wheezing or shortness of breath denies nausea vomiting diarrhea. Denies high fever. Has not tried any medicines for it. Has history of allergies. Also has a history of multiple other problems please see the notes. Family history noncontributory patient doesn't smoke.   Review of Systems Please see above.    Objective:   Physical Exam Vital signs stable. Eardrums normal throat is normal sinus nontender neck no masses lungs are clear no crackles heart is ir- regular pulse rate normal       Assessment & Plan:  Allergic rhinitis-Depo-Medrol shot, Flonase, over-the-counter loratadine followup if ongoing troubles warning signs discussed

## 2013-03-21 NOTE — Patient Instructions (Signed)
Use generic Claritin ( Loratadine 10 mg ) one daily for next 30 days  flonase - allergy spray- use 1 sray in each nare then 1 minute later do 1 spray each nostril use this for the next 4 weeks

## 2013-04-04 ENCOUNTER — Ambulatory Visit (INDEPENDENT_AMBULATORY_CARE_PROVIDER_SITE_OTHER): Payer: Medicare Other | Admitting: *Deleted

## 2013-04-04 DIAGNOSIS — I4891 Unspecified atrial fibrillation: Secondary | ICD-10-CM | POA: Diagnosis not present

## 2013-04-04 DIAGNOSIS — Z7901 Long term (current) use of anticoagulants: Secondary | ICD-10-CM | POA: Diagnosis not present

## 2013-04-04 LAB — POCT INR: INR: 2.8

## 2013-04-10 ENCOUNTER — Other Ambulatory Visit: Payer: Self-pay | Admitting: Family Medicine

## 2013-04-24 DIAGNOSIS — S8010XA Contusion of unspecified lower leg, initial encounter: Secondary | ICD-10-CM | POA: Diagnosis not present

## 2013-04-27 ENCOUNTER — Inpatient Hospital Stay (HOSPITAL_COMMUNITY)
Admission: EM | Admit: 2013-04-27 | Discharge: 2013-04-30 | DRG: 552 | Disposition: A | Discharge status: Skilled Nursing Facility | Payer: Medicare Other | Attending: Internal Medicine | Admitting: Internal Medicine

## 2013-04-27 ENCOUNTER — Encounter (HOSPITAL_COMMUNITY): Payer: Self-pay | Admitting: Physical Medicine and Rehabilitation

## 2013-04-27 ENCOUNTER — Emergency Department (HOSPITAL_COMMUNITY): Payer: Medicare Other

## 2013-04-27 DIAGNOSIS — I359 Nonrheumatic aortic valve disorder, unspecified: Secondary | ICD-10-CM | POA: Diagnosis present

## 2013-04-27 DIAGNOSIS — K219 Gastro-esophageal reflux disease without esophagitis: Secondary | ICD-10-CM | POA: Diagnosis present

## 2013-04-27 DIAGNOSIS — Z8673 Personal history of transient ischemic attack (TIA), and cerebral infarction without residual deficits: Secondary | ICD-10-CM

## 2013-04-27 DIAGNOSIS — E785 Hyperlipidemia, unspecified: Secondary | ICD-10-CM | POA: Diagnosis present

## 2013-04-27 DIAGNOSIS — M7989 Other specified soft tissue disorders: Secondary | ICD-10-CM | POA: Diagnosis not present

## 2013-04-27 DIAGNOSIS — M7730 Calcaneal spur, unspecified foot: Secondary | ICD-10-CM | POA: Diagnosis not present

## 2013-04-27 DIAGNOSIS — T148XXA Other injury of unspecified body region, initial encounter: Secondary | ICD-10-CM | POA: Diagnosis not present

## 2013-04-27 DIAGNOSIS — IMO0002 Reserved for concepts with insufficient information to code with codable children: Secondary | ICD-10-CM | POA: Diagnosis not present

## 2013-04-27 DIAGNOSIS — M5137 Other intervertebral disc degeneration, lumbosacral region: Secondary | ICD-10-CM | POA: Diagnosis not present

## 2013-04-27 DIAGNOSIS — I10 Essential (primary) hypertension: Secondary | ICD-10-CM | POA: Diagnosis not present

## 2013-04-27 DIAGNOSIS — S335XXA Sprain of ligaments of lumbar spine, initial encounter: Principal | ICD-10-CM | POA: Diagnosis present

## 2013-04-27 DIAGNOSIS — I4891 Unspecified atrial fibrillation: Secondary | ICD-10-CM | POA: Diagnosis present

## 2013-04-27 DIAGNOSIS — M545 Low back pain, unspecified: Secondary | ICD-10-CM | POA: Diagnosis not present

## 2013-04-27 DIAGNOSIS — Z7901 Long term (current) use of anticoagulants: Secondary | ICD-10-CM

## 2013-04-27 DIAGNOSIS — Z87891 Personal history of nicotine dependence: Secondary | ICD-10-CM | POA: Diagnosis not present

## 2013-04-27 DIAGNOSIS — M549 Dorsalgia, unspecified: Secondary | ICD-10-CM | POA: Diagnosis present

## 2013-04-27 DIAGNOSIS — M6281 Muscle weakness (generalized): Secondary | ICD-10-CM | POA: Diagnosis not present

## 2013-04-27 DIAGNOSIS — M1990 Unspecified osteoarthritis, unspecified site: Secondary | ICD-10-CM | POA: Diagnosis present

## 2013-04-27 DIAGNOSIS — Z833 Family history of diabetes mellitus: Secondary | ICD-10-CM | POA: Diagnosis not present

## 2013-04-27 DIAGNOSIS — Z823 Family history of stroke: Secondary | ICD-10-CM

## 2013-04-27 DIAGNOSIS — I739 Peripheral vascular disease, unspecified: Secondary | ICD-10-CM | POA: Diagnosis present

## 2013-04-27 DIAGNOSIS — Z9119 Patient's noncompliance with other medical treatment and regimen: Secondary | ICD-10-CM

## 2013-04-27 DIAGNOSIS — Z91199 Patient's noncompliance with other medical treatment and regimen due to unspecified reason: Secondary | ICD-10-CM

## 2013-04-27 DIAGNOSIS — K7689 Other specified diseases of liver: Secondary | ICD-10-CM | POA: Diagnosis present

## 2013-04-27 DIAGNOSIS — K5900 Constipation, unspecified: Secondary | ICD-10-CM | POA: Diagnosis not present

## 2013-04-27 DIAGNOSIS — R262 Difficulty in walking, not elsewhere classified: Secondary | ICD-10-CM | POA: Diagnosis not present

## 2013-04-27 DIAGNOSIS — R279 Unspecified lack of coordination: Secondary | ICD-10-CM | POA: Diagnosis not present

## 2013-04-27 DIAGNOSIS — N138 Other obstructive and reflux uropathy: Secondary | ICD-10-CM

## 2013-04-27 DIAGNOSIS — W1789XA Other fall from one level to another, initial encounter: Secondary | ICD-10-CM | POA: Diagnosis present

## 2013-04-27 DIAGNOSIS — E119 Type 2 diabetes mellitus without complications: Secondary | ICD-10-CM | POA: Diagnosis present

## 2013-04-27 DIAGNOSIS — R609 Edema, unspecified: Secondary | ICD-10-CM | POA: Diagnosis not present

## 2013-04-27 DIAGNOSIS — R600 Localized edema: Secondary | ICD-10-CM | POA: Diagnosis present

## 2013-04-27 DIAGNOSIS — N401 Enlarged prostate with lower urinary tract symptoms: Secondary | ICD-10-CM | POA: Diagnosis not present

## 2013-04-27 DIAGNOSIS — Z9181 History of falling: Secondary | ICD-10-CM | POA: Diagnosis not present

## 2013-04-27 HISTORY — DX: Other giant cell arteritis: M31.6

## 2013-04-27 LAB — CBC WITH DIFFERENTIAL/PLATELET
Eosinophils Absolute: 0.2 10*3/uL (ref 0.0–0.7)
Hemoglobin: 15.3 g/dL (ref 13.0–17.0)
Lymphocytes Relative: 11 % — ABNORMAL LOW (ref 12–46)
Lymphs Abs: 1 10*3/uL (ref 0.7–4.0)
MCH: 30.5 pg (ref 26.0–34.0)
MCV: 88.6 fL (ref 78.0–100.0)
Monocytes Relative: 5 % (ref 3–12)
Neutrophils Relative %: 82 % — ABNORMAL HIGH (ref 43–77)
Platelets: 230 10*3/uL (ref 150–400)
RBC: 5.01 MIL/uL (ref 4.22–5.81)
WBC: 9.2 10*3/uL (ref 4.0–10.5)

## 2013-04-27 LAB — URINALYSIS, ROUTINE W REFLEX MICROSCOPIC
Bilirubin Urine: NEGATIVE
Glucose, UA: NEGATIVE mg/dL
Specific Gravity, Urine: 1.013 (ref 1.005–1.030)
Urobilinogen, UA: 0.2 mg/dL (ref 0.0–1.0)
pH: 5 (ref 5.0–8.0)

## 2013-04-27 LAB — BASIC METABOLIC PANEL
BUN: 14 mg/dL (ref 6–23)
CO2: 29 mEq/L (ref 19–32)
Chloride: 103 mEq/L (ref 96–112)
GFR calc non Af Amer: 68 mL/min — ABNORMAL LOW (ref >90)
Glucose, Bld: 118 mg/dL — ABNORMAL HIGH (ref 70–99)
Potassium: 4.5 mEq/L (ref 3.5–5.1)
Sodium: 139 mEq/L (ref 135–145)

## 2013-04-27 LAB — URINE MICROSCOPIC-ADD ON

## 2013-04-27 LAB — PROTIME-INR: INR: 2.82 — ABNORMAL HIGH (ref 0.00–1.49)

## 2013-04-27 MED ORDER — METHOCARBAMOL 100 MG/ML IJ SOLN: 500.0000 mg | Freq: Four times a day (QID) | INTRAVENOUS | Status: DC | PRN

## 2013-04-27 MED ORDER — INSULIN ASPART 100 UNIT/ML ~~LOC~~ SOLN
0.0000 [IU] | Freq: Three times a day (TID) | SUBCUTANEOUS | Status: DC
  Administered 2013-04-28: 3 [IU] via SUBCUTANEOUS
  Administered 2013-04-28 – 2013-04-29 (×3): 2 [IU] via SUBCUTANEOUS
  Administered 2013-04-29: 3 [IU] via SUBCUTANEOUS
  Administered 2013-04-29 – 2013-04-30 (×3): 2 [IU] via SUBCUTANEOUS

## 2013-04-27 MED ORDER — SODIUM CHLORIDE 0.9 % IV SOLN: 250.0000 mL | INTRAVENOUS | Status: DC | PRN

## 2013-04-27 MED ORDER — WARFARIN SODIUM 5 MG PO TABS
5.0000 mg | ORAL_TABLET | Freq: Once | ORAL | Status: AC
  Administered 2013-04-27: 5 mg via ORAL
  Filled 2013-04-27: qty 1

## 2013-04-27 MED ORDER — ONDANSETRON HCL 4 MG PO TABS: 4.0000 mg | ORAL_TABLET | Freq: Four times a day (QID) | ORAL | Status: DC | PRN

## 2013-04-27 MED ORDER — FUROSEMIDE 20 MG PO TABS
20.0000 mg | ORAL_TABLET | Freq: Every day | ORAL | Status: DC
  Administered 2013-04-28 – 2013-04-30 (×3): 20 mg via ORAL
  Filled 2013-04-27 (×3): qty 1

## 2013-04-27 MED ORDER — INSULIN ASPART 100 UNIT/ML ~~LOC~~ SOLN: 0.0000 [IU] | Freq: Every day | SUBCUTANEOUS | Status: DC

## 2013-04-27 MED ORDER — HYDROCODONE-ACETAMINOPHEN 5-325 MG PO TABS
1.0000 | ORAL_TABLET | Freq: Once | ORAL | Status: AC
  Administered 2013-04-27: 1 via ORAL
  Filled 2013-04-27: qty 1

## 2013-04-27 MED ORDER — ALBUTEROL SULFATE HFA 108 (90 BASE) MCG/ACT IN AERS
2.0000 | INHALATION_SPRAY | RESPIRATORY_TRACT | Status: DC | PRN
  Administered 2013-04-29: 2 via RESPIRATORY_TRACT
  Filled 2013-04-27: qty 6.7

## 2013-04-27 MED ORDER — DIAZEPAM 5 MG PO TABS
5.0000 mg | ORAL_TABLET | Freq: Every evening | ORAL | Status: DC | PRN
  Administered 2013-04-29: 5 mg via ORAL
  Filled 2013-04-27: qty 1

## 2013-04-27 MED ORDER — ONDANSETRON HCL 4 MG/2ML IJ SOLN
4.0000 mg | Freq: Four times a day (QID) | INTRAMUSCULAR | Status: DC | PRN
  Administered 2013-04-29: 4 mg via INTRAVENOUS
  Filled 2013-04-27: qty 2

## 2013-04-27 MED ORDER — PANTOPRAZOLE SODIUM 40 MG PO TBEC
40.0000 mg | DELAYED_RELEASE_TABLET | Freq: Every day | ORAL | Status: DC
  Administered 2013-04-28 – 2013-04-30 (×3): 40 mg via ORAL
  Filled 2013-04-27 (×3): qty 1

## 2013-04-27 MED ORDER — SENNA 8.6 MG PO TABS
1.0000 | ORAL_TABLET | Freq: Two times a day (BID) | ORAL | Status: DC
  Administered 2013-04-27 – 2013-04-30 (×6): 8.6 mg via ORAL
  Filled 2013-04-27 (×7): qty 1

## 2013-04-27 MED ORDER — HYDROCODONE-ACETAMINOPHEN 5-325 MG PO TABS
1.0000 | ORAL_TABLET | ORAL | Status: DC | PRN
  Administered 2013-04-27 – 2013-04-28 (×4): 1 via ORAL
  Filled 2013-04-27 (×4): qty 1

## 2013-04-27 MED ORDER — ACETAMINOPHEN 325 MG PO TABS: 650.0000 mg | ORAL_TABLET | Freq: Four times a day (QID) | ORAL | Status: DC | PRN

## 2013-04-27 MED ORDER — WARFARIN - PHARMACIST DOSING INPATIENT
Freq: Every day | Status: DC
  Administered 2013-04-29: 18:00:00

## 2013-04-27 MED ORDER — CITALOPRAM HYDROBROMIDE 10 MG PO TABS
10.0000 mg | ORAL_TABLET | Freq: Every day | ORAL | Status: DC
  Administered 2013-04-28 – 2013-04-30 (×3): 10 mg via ORAL
  Filled 2013-04-27 (×3): qty 1

## 2013-04-27 MED ORDER — SODIUM CHLORIDE 0.9 % IJ SOLN: 3.0000 mL | INTRAMUSCULAR | Status: DC | PRN

## 2013-04-27 MED ORDER — ACETAMINOPHEN 650 MG RE SUPP: 650.0000 mg | Freq: Four times a day (QID) | RECTAL | Status: DC | PRN

## 2013-04-27 MED ORDER — TRAMADOL HCL 50 MG PO TABS
50.0000 mg | ORAL_TABLET | Freq: Four times a day (QID) | ORAL | Status: DC | PRN
  Filled 2013-04-27: qty 1

## 2013-04-27 MED ORDER — SODIUM CHLORIDE 0.9 % IJ SOLN
3.0000 mL | Freq: Two times a day (BID) | INTRAMUSCULAR | Status: DC
  Administered 2013-04-28 – 2013-04-30 (×3): 3 mL via INTRAVENOUS

## 2013-04-27 MED ORDER — METOPROLOL SUCCINATE ER 25 MG PO TB24
25.0000 mg | ORAL_TABLET | Freq: Every day | ORAL | Status: DC
  Administered 2013-04-28 – 2013-04-30 (×3): 25 mg via ORAL
  Filled 2013-04-27 (×3): qty 1

## 2013-04-27 MED ORDER — HYDROMORPHONE HCL PF 1 MG/ML IJ SOLN: 1.0000 mg | INTRAMUSCULAR | Status: DC | PRN

## 2013-04-27 MED ORDER — ONDANSETRON HCL 4 MG/2ML IJ SOLN
4.0000 mg | Freq: Once | INTRAMUSCULAR | Status: AC
  Administered 2013-04-27: 4 mg via INTRAVENOUS
  Filled 2013-04-27: qty 2

## 2013-04-27 MED ORDER — ESCITALOPRAM OXALATE 10 MG PO TABS: 10.0000 mg | ORAL_TABLET | Freq: Every day | ORAL | Status: DC

## 2013-04-27 MED ORDER — POLYETHYLENE GLYCOL 3350 17 G PO PACK
17.0000 g | PACK | Freq: Every day | ORAL | Status: DC | PRN
  Administered 2013-04-29: 17 g via ORAL
  Filled 2013-04-27: qty 1

## 2013-04-27 MED ORDER — SODIUM CHLORIDE 0.9 % IJ SOLN
3.0000 mL | Freq: Two times a day (BID) | INTRAMUSCULAR | Status: DC
  Administered 2013-04-27 – 2013-04-30 (×4): 3 mL via INTRAVENOUS

## 2013-04-27 MED ORDER — DOCUSATE SODIUM 100 MG PO CAPS
100.0000 mg | ORAL_CAPSULE | Freq: Two times a day (BID) | ORAL | Status: DC
  Administered 2013-04-27 – 2013-04-30 (×6): 100 mg via ORAL
  Filled 2013-04-27 (×7): qty 1

## 2013-04-27 MED ORDER — MORPHINE SULFATE 4 MG/ML IJ SOLN
4.0000 mg | Freq: Once | INTRAMUSCULAR | Status: AC
  Administered 2013-04-27: 4 mg via INTRAVENOUS
  Filled 2013-04-27: qty 1

## 2013-04-27 NOTE — Progress Notes (Signed)
EMIGDIO WILDEMAN [id number] Admitted to [id number]: 04/27/2013 2122 Attending Provider: Therisa Doyne, MD    Genene Churn Kamm is a 79 y.o. male patient admitted from ED awake, alert  & orientated  X 3,  Full Code, VSS - Blood pressure 145/61, pulse 73, temperature 97.3 F (36.3 C), temperature source Oral, resp. rate 18, height 6' 1.5" (1.867 m), weight 115.3 kg (254 lb 3.1 oz), SpO2 96.00%.RA, no c/o shortness of breath, no c/o chest pain, no distress noted. Tele [phone] placed and pt is currently running: atrial fibrillation rate 70's.   IV site WDL:  wrist right, condition patent and no redness with a transparent dsg that's clean dry and intact.  Allergies:   Allergies  Allergen Reactions  . Ambien (Zolpidem Tartrate) Other (See Comments)    Sleep walks  . Cholestatin   . Lipitor (Atorvastatin Calcium) Other (See Comments)    myalgias  . Ranitidine Other (See Comments)    Chest discomfort  . Simvastatin Other (See Comments)    Myalgias  . Xanax Xr (Alprazolam Er)     Tightness in chest     Past Medical History  Diagnosis Date  . Hypertension     Borderline  . Cerebrovascular disease 07/2010    TIA; carotid ultrasound in 07/2010-significant bilateral plaque without focal internal carotid artery stenosis; MRI -encephalomalacia left temporal and right temporal lobes; small inferior right cerebellar infarct; small vessel disease  . Hyperlipidemia     adverse reactions to statins and niacin  . History of noncompliance with medical treatment   . Aortic stenosis 2007    Very mild  . Hepatic steatosis   . Diabetes mellitus     no insulin; A1c of 6.6 in 2005  . Atrial fibrillation     Paroxysmal; Echocardiogram in 2007-normal EF; mild LVH; left atrial enlargement; mild stenosis and minimal AI; negative stress nuclear study in 2008  . Exertional dyspnea   . Degenerative joint disease     feet and legs  . Dizziness     occurs daily,especially in am  . Gastroesophageal reflux disease   .  Renal insufficiency   . Asthma   . Irregular heartbeat   . Peripheral vascular disease   . Temporal arteritis     History:  obtained from the patient.  Pt orientation to unit, room and routine. Information packet given to patient/family and safety video watched.  Admission INP armband ID verified with patient/family, and in place. SR up x 2, fall risk assessment completed with patient and pt verbalizes understanding of risks associated with falls. Pt verbalizes an understanding of how to use the call bell and to call for help before getting out of bed.  Skin, clean-dry- intact with BUE scattered bruises, scab to LLFA and to Rt elbow. Non pitting edema to LLE.  No evidence of skin break down noted on exam.   Will cont to monitor and assist as needed.  Julien Nordmann [facility], RN 04/27/2013 11:17 PM

## 2013-04-27 NOTE — Progress Notes (Signed)
ANTICOAGULATION CONSULT NOTE - Initial Consult  Pharmacy Consult for coumadin Indication: atrial fibrillation  Allergies  Allergen Reactions  . Cholestatin   . Lipitor (Atorvastatin Calcium) Other (See Comments)    myalgias  . Ranitidine Other (See Comments)    Chest discomfort  . Simvastatin Other (See Comments)    Myalgias  . Xanax Xr (Alprazolam Er)     Tightness in chest    Patient Measurements: Height: 6\' 1"  (185.4 cm) Weight: 255 lb (115.667 kg) IBW/kg (Calculated) : 79.9  Vital Signs: Temp: 97.4 F (36.3 C) (06/14 1352) Temp src: Oral (06/14 1352) BP: 125/70 mmHg (06/14 2026) Pulse Rate: 69 (06/14 2026)  Labs:  Recent Labs  04/27/13 1442  HGB 15.3  HCT 44.4  PLT 230  LABPROT 28.2*  INR 2.82*  CREATININE 1.02    Estimated Creatinine Clearance: 79.5 ml/min (by C-G formula based on Cr of 1.02).   Medical History: Past Medical History  Diagnosis Date  . Hypertension     Borderline  . Cerebrovascular disease 07/2010    TIA; carotid ultrasound in 07/2010-significant bilateral plaque without focal internal carotid artery stenosis; MRI -encephalomalacia left temporal and right temporal lobes; small inferior right cerebellar infarct; small vessel disease  . Hyperlipidemia     adverse reactions to statins and niacin  . History of noncompliance with medical treatment   . Aortic stenosis 2007    Very mild  . Hepatic steatosis   . Diabetes mellitus     no insulin; A1c of 6.6 in 2005  . Atrial fibrillation     Paroxysmal; Echocardiogram in 2007-normal EF; mild LVH; left atrial enlargement; mild stenosis and minimal AI; negative stress nuclear study in 2008  . Exertional dyspnea   . Degenerative joint disease     feet and legs  . Dizziness     occurs daily,especially in am  . Gastroesophageal reflux disease   . Renal insufficiency   . Asthma   . Irregular heartbeat   . Peripheral vascular disease   . Temporal arteritis     Medications:  Prescriptions  prior to admission  Medication Sig Dispense Refill  . diazepam (VALIUM) 5 MG tablet Take 5 mg by mouth at bedtime as needed for anxiety or sleep.      . furosemide (LASIX) 20 MG tablet Take 20-40 mg by mouth daily. Take two tablets by mouth on day 1, then take one tablet thereafter as directed by physician. (Two tablets (40mg ) were taken today as initial dose as directed by physician)      . glyBURIDE micronized (GLYNASE) 3 MG tablet TAKE 1 TABLET BY MOUTH EVERY DAY FOR DIABETES  30 tablet  3  . HYDROcodone-acetaminophen (NORCO) 10-325 MG per tablet Take 1 tablet by mouth every 6 (six) hours as needed.      . metoprolol succinate (TOPROL-XL) 25 MG 24 hr tablet Take 25 mg by mouth every morning.       . warfarin (COUMADIN) 5 MG tablet Take 5 mg by mouth. Take as directed      . albuterol (PROVENTIL HFA;VENTOLIN HFA) 108 (90 BASE) MCG/ACT inhaler Inhale 2 puffs into the lungs as needed.      . citalopram (CELEXA) 10 MG tablet       . clobetasol cream (TEMOVATE) 0.05 % Apply topically 2 (two) times daily.      . diclofenac (VOLTAREN) 75 MG EC tablet Take 75 mg by mouth daily.      [name] escitalopram (LEXAPRO) 10 MG tablet Take  10 mg by mouth daily.      . fluticasone (FLONASE) 50 MCG/ACT nasal spray Place 1 spray into the nose daily as needed. Allergies  16 g  6  . hydrOXYzine (ATARAX/VISTARIL) 25 MG tablet Take 25 mg by mouth daily as needed for itching.       . nortriptyline (PAMELOR) 25 MG capsule Take 25 mg by mouth at bedtime.      . potassium chloride (K-DUR) 10 MEQ tablet Take 20 mEq by mouth daily.      . potassium chloride (K-DUR,KLOR-CON) 10 MEQ tablet TAKE 2 TABLETS BY MOUTH EVERY DAY  60 tablet  3  . traMADol (ULTRAM) 50 MG tablet Take 1 tablet (50 mg total) by mouth every 8 (eight) hours as needed.  90 tablet  4   Scheduled:  . citalopram  10 mg Oral Daily  . docusate sodium  100 mg Oral BID  . escitalopram  10 mg Oral Daily  . [START ON 04/28/2013] furosemide  20 mg Oral Daily  .  [START ON 04/28/2013] insulin aspart  0-15 Units Subcutaneous TID WC  . insulin aspart  0-5 Units Subcutaneous QHS  . [START ON 04/28/2013] metoprolol succinate  25 mg Oral [id number]  . [START ON 04/28/2013] pantoprazole  40 mg Oral Q1200  . senna  1 tablet Oral BID  . sodium chloride  3 mL Intravenous Q12H  . sodium chloride  3 mL Intravenous Q12H    Assessment: 79 yo male here with back pain (s/p tractor accident) on coumadin PTA for afib to continue while admitted. INR today= 2.82, hg/hct= 15.3/44.4.  Home coumadin dose: 5mg /day per patient  Goal of Therapy:  INR 2-3 Monitor platelets by anticoagulation protocol: Yes   Plan:  -Coumadin 5mg  po today -PT/INR daily  Harland German, Pharm D 04/27/2013 9:36 PM

## 2013-04-27 NOTE — H&P (Signed)
PCP:  Lilyan Punt, MD  Cardiology in [place].  [facility]  Chief Complaint:   Back pain  HPI: [name] [name] is a 79 y.o. male   has a past medical history of Hypertension; Cerebrovascular disease (07/2010); Hyperlipidemia; History of noncompliance with medical treatment; Aortic stenosis (2007); Hepatic steatosis; Diabetes mellitus; Atrial fibrillation; Exertional dyspnea; Degenerative joint disease; Dizziness; Gastroesophageal reflux disease; Renal insufficiency; Asthma; Irregular heartbeat; and Peripheral vascular disease.   Presented with  Riding on a tractor and had an accident when his tractor dropped down about 18 inches. Since then he have had severe back pain. He is able to lay down but has pain with sitting up and ambulation. He have not had any neurological problems since his injury. Able to move his legs well and no incontinence. CT scan in ED showed some shallow disk bulging at L5-S1. The pain is perivertebral worse on the right shooting down to the buttock.  Otherwise he is at baseline of his health. Denies any fevers or chills, no chest pain, no shortness of breath.  His pain was not controlled in ED and hospitalist was calleld for an admission. 2 weeks ago he had a lown mover fall on his Left leg and have had some skin laceration that is now healing but his leg has been swelling.   Review of Systems:    Pertinent positives include: back pain  Constitutional:  No weight loss, night sweats, Fevers, chills, fatigue, weight loss  HEENT:  No headaches, Difficulty swallowing,Tooth/dental problems,Sore throat,  No sneezing, itching, ear ache, nasal congestion, post nasal drip,  Cardio-vascular:  No chest pain, Orthopnea, PND, anasarca, dizziness, palpitations.no Bilateral lower extremity swelling  GI:  No heartburn, indigestion, abdominal pain, nausea, vomiting, diarrhea, change in bowel habits, loss of appetite, melena, blood in stool, hematemesis Resp:  no shortness  of breath at rest. No dyspnea on exertion, No excess mucus, no productive cough, No non-productive cough, No coughing up of blood.No change in color of mucus.No wheezing. Skin:  no rash or lesions. No jaundice GU:  no dysuria, change in color of urine, no urgency or frequency. No straining to urinate.  No flank pain.  Musculoskeletal:  No joint pain or no joint swelling. No decreased range of motion. No back pain.  Psych:  No change in mood or affect. No depression or anxiety. No memory loss.  Neuro: no localizing neurological complaints, no tingling, no weakness, no double vision, no gait abnormality, no slurred speech, no confusion  Otherwise ROS are negative except for above, 10 systems were reviewed  Past Medical History: Past Medical History  Diagnosis Date  . Hypertension     Borderline  . Cerebrovascular disease 07/2010    TIA; carotid ultrasound in 07/2010-significant bilateral plaque without focal internal carotid artery stenosis; MRI -encephalomalacia left temporal and right temporal lobes; small inferior right cerebellar infarct; small vessel disease  . Hyperlipidemia     adverse reactions to statins and niacin  . History of noncompliance with medical treatment   . Aortic stenosis 2007    Very mild  . Hepatic steatosis   . Diabetes mellitus     no insulin; A1c of 6.6 in 2005  . Atrial fibrillation     Paroxysmal; Echocardiogram in 2007-normal EF; mild LVH; left atrial enlargement; mild stenosis and minimal AI; negative stress nuclear study in 2008  . Exertional dyspnea   . Degenerative joint disease     feet and legs  . Dizziness     occurs daily,especially  in am  . Gastroesophageal reflux disease   . Renal insufficiency   . Asthma   . Irregular heartbeat   . Peripheral vascular disease    Past Surgical History  Procedure Laterality Date  . Rotator cuff repair      Right  . Lipoma excision  1980  . Urethral stricture dilatation  1980s  . Orif ankle fracture   2000    Right  . Colonoscopy  2002  . Transurethral resection of prostate  09/2011  . Colonoscopy  01/19/2012    Procedure: COLONOSCOPY;  Surgeon: Malissa Hippo, MD;  Location: [facility];  Service: Endoscopy;  Laterality: N/A;  1030  . Prostate surgery  12/2011     Medications: Prior to Admission medications   Medication Sig Start Date End Date Taking? Authorizing Provider  furosemide (LASIX) 20 MG tablet Take 20-40 mg by mouth daily. Take two tablets by mouth on day 1, then take one tablet thereafter as directed by physician. (Two tablets (40mg ) were taken today as initial dose as directed by physician)   Yes Historical Provider, MD  glyBURIDE micronized (GLYNASE) 3 MG tablet TAKE 1 TABLET BY MOUTH EVERY DAY FOR DIABETES 04/10/13  Yes Babs Sciara, MD  HYDROcodone-acetaminophen (NORCO) 10-325 MG per tablet Take 1 tablet by mouth every 6 (six) hours as needed.   Yes Historical Provider, MD  metoprolol succinate (TOPROL-XL) 25 MG 24 hr tablet Take 25 mg by mouth every morning.  07/11/11  Yes Historical Provider, MD  warfarin (COUMADIN) 5 MG tablet Take 5 mg by mouth. Take as directed 07/01/11  Yes Historical Provider, MD  albuterol (PROVENTIL HFA;VENTOLIN HFA) 108 (90 BASE) MCG/ACT inhaler Inhale 2 puffs into the lungs as needed.    Historical Provider, MD  citalopram (CELEXA) 10 MG tablet  01/30/13   Historical Provider, MD  clobetasol cream (TEMOVATE) 0.05 % Apply topically 2 (two) times daily.    Historical Provider, MD  diclofenac (VOLTAREN) 75 MG EC tablet Take 75 mg by mouth daily. 04/21/12   Historical Provider, MD  escitalopram (LEXAPRO) 10 MG tablet Take 10 mg by mouth daily. 04/21/12   Historical Provider, MD  fluticasone (FLONASE) 50 MCG/ACT nasal spray Place 1 spray into the nose daily as needed. Allergies 03/21/13   Babs Sciara, MD  hydrOXYzine (ATARAX/VISTARIL) 25 MG tablet Take 25 mg by mouth daily as needed for itching.     Historical Provider, MD  nortriptyline (PAMELOR) 25 MG  capsule Take 25 mg by mouth at bedtime.    Historical Provider, MD  potassium chloride (K-DUR) 10 MEQ tablet Take 20 mEq by mouth daily.    Historical Provider, MD  potassium chloride (K-DUR,KLOR-CON) 10 MEQ tablet TAKE 2 TABLETS BY MOUTH EVERY DAY 03/03/13   Babs Sciara, MD  traMADol (ULTRAM) 50 MG tablet Take 1 tablet (50 mg total) by mouth every 8 (eight) hours as needed. 01/30/13   Babs Sciara, MD    Allergies:   Allergies  Allergen Reactions  . Cholestatin   . Lipitor (Atorvastatin Calcium) Other (See Comments)    myalgias  . Ranitidine Other (See Comments)    Chest discomfort  . Simvastatin Other (See Comments)    Myalgias  . Xanax Xr (Alprazolam Er)     Tightness in chest    Social History:  Ambulatory   Independently  Lives at  home   reports that he quit smoking about 21 years ago. His smoking use included Cigarettes. He has a 20  pack-year smoking history. He quit smokeless tobacco use about 16 months ago. His smokeless tobacco use included Chew. He reports that he does not drink alcohol or use illicit drugs.   Family History: family history includes Diabetes in his father and Stroke in his mother.  There is no history of Colon cancer.    Physical Exam: Patient Vitals for the past 24 hrs:  BP Temp Temp src Pulse Resp SpO2 Height Weight  04/27/13 1600 122/64 mmHg - - 73 - 99 % - -  04/27/13 1559 143/71 mmHg - - 71 16 99 % - -  04/27/13 1352 133/78 mmHg 97.4 F (36.3 C) Oral 76 18 94 % 6\' 1"  (1.854 m) 115.667 kg (255 lb)    1. General:  in No Acute distress 2. Psychological: Alert and  Oriented 3. Head/ENT:   Moist  Mucous Membranes                          Head Non traumatic, neck supple                          Normal   Dentition 4. SKIN:  decreased Skin turgor,  Skin clean Dry and intact no rash, an abrasion to left ankle 5. Heart: Regular rate and rhythm no Murmur, Rub or gallop 6. Lungs: Clear to auscultation bilaterally, no wheezes or crackles, distant    7. Abdomen: Soft, non-tender, Non distended 8. Lower extremities: no clubbing, cyanosis, 2+ edema on left only 9. Neurologically Strength intact in all 4 ext. CN intact 10. MSK: dieminished ability to sit up due to pain in his lower back.  body mass index is 33.65 kg/(m^2).   Labs on Admission:   Recent Labs  04/27/13 1442  NA 139  K 4.5  CL 103  CO2 29  GLUCOSE 118*  BUN 14  CREATININE 1.02  CALCIUM 9.5   No results found for this basename: AST, ALT, ALKPHOS, BILITOT, PROT, ALBUMIN,  in the last 72 hours No results found for this basename: LIPASE, AMYLASE,  in the last 72 hours  Recent Labs  04/27/13 1442  WBC 9.2  NEUTROABS 7.5  HGB 15.3  HCT 44.4  MCV 88.6  PLT 230   No results found for this basename: CKTOTAL, CKMB, CKMBINDEX, TROPONINI,  in the last 72 hours No results found for this basename: TSH, T4TOTAL, FREET3, T3FREE, THYROIDAB,  in the last 72 hours No results found for this basename: VITAMINB12, FOLATE, FERRITIN, TIBC, IRON, RETICCTPCT,  in the last 72 hours Lab Results  Component Value Date   HGBA1C 6.8 01/30/2013    Estimated Creatinine Clearance: 79.5 ml/min (by C-G formula based on Cr of 1.02). ABG    Component Value Date/Time   PHART 7.477* 07/25/2011 1319   HCO3 23.1 07/25/2011 1319   TCO2 19.1 07/25/2011 1319   ACIDBASEDEF 0.0 07/25/2011 1319   O2SAT 97.1 07/25/2011 1319     No results found for this basename: DDIMER     Other results:  I have pearsonaly reviewed this: ECG REPORT  Rate: 74  Rhythm: A. fib ST&T Change: no ischemia  UA NO evidence of infection   Cultures: No results found for this basename: sdes, specrequest, cult, reptstatus       Radiological Exams on Admission: Dg Lumbar Spine Complete  04/27/2013   *RADIOLOGY REPORT*  Clinical Data: Fall from tractor.  Low back pain.  LUMBAR SPINE - COMPLETE  4+ VIEW  Comparison: None.  Findings: No evidence of acute fracture, spondylolysis, or spondylolisthesis.  Mild to  moderate degenerative disc disease with vacuum disc phenomenon seen at levels of L3-4, L4-5, and L5-S1.  No evidence of facet arthropathy other significant bone abnormality. Atherosclerotic calcification of abdominal aorta seen, without definite evidence of aneurysm.  IMPRESSION:  1.  No acute findings. 2.  Mild to moderate lower lumbar degenerative disc disease.   Original Report Authenticated By: Myles Rosenthal, M.D.   Dg Ankle Complete Left  04/27/2013   *RADIOLOGY REPORT*  Clinical Data: Back pain, leg pain  LEFT ANKLE COMPLETE - 3+ VIEW  Comparison: None.  Findings: Three views of the left ankle submitted.  No acute fracture or subluxation.  Ankle mortise is preserved.  Plantar spur of the calcaneus.  IMPRESSION: No acute fracture or subluxation.  Small plantar spur of the calcaneus.   Original Report Authenticated By: Natasha Mead, M.D.   Ct Lumbar Spine Wo Contrast  04/27/2013   *RADIOLOGY REPORT*  Clinical Data: Back pain after twisting injury.  CT LUMBAR SPINE WITHOUT CONTRAST  Technique:  Multidetector CT imaging of the lumbar spine was performed without intravenous contrast administration. Multiplanar CT image reconstructions were also generated.  Comparison: Plain films lumbar spine earlier this same day.  CT abdomen and pelvis 06/24/2010.  Findings: There is no fracture or subluxation of the lumbar spine with vertebral body height and alignment maintained throughout. Small Schmorl's node in the superior endplate of L4 is new since the prior study.  Imaged paraspinous structures demonstrate scattered atherosclerotic vascular disease.  T11-12:  Negative.  T12-L1:  Negative.  L1-2:  There is some facet degenerative change.  No disc bulge or protrusion.  Central canal and foramina are open.  L2-3:  Facet arthropathy is identified.  Minimal disc bulge without central canal or foraminal narrowing.  L3-4:  There is facet arthropathy and a shallow disc bulge.  Vacuum disc phenomenon is noted.  The central canal  and foramina appear open.  L4-5:  There is facet degenerative disease.  Shallow disc bulge eccentric to the left and ligamentum flavum thickening are identified.  The central canal is mildly to moderately narrowed. There is also mild to moderate left foraminal narrowing.  The right foramen is open.  L5-S1:  Shallow disc bulge.  The central canal and foramina are patent.  IMPRESSION:  1.  Negative for fracture or other acute abnormality.  2.  Degenerative disc disease appearing most notable at L4-5 where there is mild to moderate central canal and left foraminal narrowing.   Original Report Authenticated By: Holley Dexter, M.D.    Chart has been reviewed  Assessment/Plan  79 yo M W hx of A.fib on coumadin here with back injury and uncontrolled pain.  Present on Admission:  . Back pain - pain secondary to injury CT scan did not show any evidence of fracture or bleeding. Will continue with conservative management of IV pain medications. Once pain is under somewhat better control can start PTOT evaluation and treatment  . Hypertension - continue home medications  . Diabetes mellitus - hold medication and initiate sliding scale  . Atrial fibrillation - continue Coumadin and Toprol watch in telemetry  . Leg edema, left - also patient is therapeutic on his Coumadin given asymmetric leg size will obtain Dopplers   Prophylaxis: coumadin , Protonix  CODE STATUS: FULL CODE according to the patient  Other plan as per orders.  I have spent a total of 55  min on this admission  Lucerito Rosinski 04/27/2013, 7:58 PM

## 2013-04-27 NOTE — ED Notes (Signed)
Pt's POA, Vilinda Boehringer, would like to be called when the pt is admitted. [phone]

## 2013-04-27 NOTE — ED Provider Notes (Signed)
History     CSN: [id number]  Arrival date & time 04/27/13  1346   First MD Initiated Contact with Patient 04/27/13 1351      Chief Complaint  Patient presents with  . Back Pain    (Consider location/radiation/quality/duration/timing/severity/associated sxs/prior treatment) HPI [name] [name] is a 79 y.o. male who presents to ED with complaint of back pain. States was sitting on a tractor that was on top of a trailer when tractor tipped back and jerked him back forward. States jerked him now having back pain. Denies weakness in extremities. No neck pain. No head injury. Denies problems with bowels or urine. No prior back problems. Also reports left lower leg pain from an injury to it 1 wk ago after a lawn mower ran over it. States already seen his orthopedist for it, negative x-rays at that time.    Past Medical History  Diagnosis Date  . Hypertension     Borderline  . Cerebrovascular disease 07/2010    TIA; carotid ultrasound in 07/2010-significant bilateral plaque without focal internal carotid artery stenosis; MRI -encephalomalacia left temporal and right temporal lobes; small inferior right cerebellar infarct; small vessel disease  . Hyperlipidemia     adverse reactions to statins and niacin  . History of noncompliance with medical treatment   . Aortic stenosis 2007    Very mild  . Hepatic steatosis   . Diabetes mellitus     no insulin; A1c of 6.6 in 2005  . Atrial fibrillation     Paroxysmal; Echocardiogram in 2007-normal EF; mild LVH; left atrial enlargement; mild stenosis and minimal AI; negative stress nuclear study in 2008  . Exertional dyspnea   . Degenerative joint disease     feet and legs  . Dizziness     occurs daily,especially in am  . Gastroesophageal reflux disease   . Renal insufficiency   . Asthma   . Irregular heartbeat   . Peripheral vascular disease     Past Surgical History  Procedure Laterality Date  . Rotator cuff repair      Right  . Lipoma  excision  1980  . Urethral stricture dilatation  1980s  . Orif ankle fracture  2000    Right  . Colonoscopy  2002  . Transurethral resection of prostate  09/2011  . Colonoscopy  01/19/2012    Procedure: COLONOSCOPY;  Surgeon: Malissa Hippo, MD;  Location: [facility];  Service: Endoscopy;  Laterality: N/A;  1030  . Prostate surgery  12/2011    Family History  Problem Relation Age of Onset  . Stroke Mother   . Diabetes Father   . Colon cancer Neg Hx     History  Substance Use Topics  . Smoking status: Former Smoker -- 1.00 packs/day for 20 years    Types: Cigarettes    Quit date: 04/25/1992  . Smokeless tobacco: Former Neurosurgeon    Types: Chew    Quit date: 12/16/2011  . Alcohol Use: No      Review of Systems  Constitutional: Negative for fever and chills.  HENT: Negative for neck pain and neck stiffness.   Respiratory: Negative.   Cardiovascular: Negative.   Gastrointestinal: Negative.   Musculoskeletal: Positive for back pain and arthralgias.  Skin: Negative.   Neurological: Negative for dizziness, weakness, numbness and headaches.  All other systems reviewed and are negative.    Allergies  Cholestatin; Lipitor; Ranitidine; Simvastatin; and Xanax xr  Home Medications   Current Outpatient Rx  Name  Route  Sig  Dispense  Refill  . furosemide (LASIX) 20 MG tablet   Oral   Take 20-40 mg by mouth daily. Take two tablets by mouth on day 1, then take one tablet thereafter as directed by physician. (Two tablets (40mg ) were taken today as initial dose as directed by physician)         . glyBURIDE micronized (GLYNASE) 3 MG tablet      TAKE 1 TABLET BY MOUTH EVERY DAY FOR DIABETES   30 tablet   3   . HYDROcodone-acetaminophen (NORCO) 10-325 MG per tablet   Oral   Take 1 tablet by mouth every 6 (six) hours as needed.         . metoprolol succinate (TOPROL-XL) 25 MG 24 hr tablet   Oral   Take 25 mg by mouth every morning.          . warfarin (COUMADIN) 5 MG  tablet   Oral   Take 5 mg by mouth. Take as directed         . albuterol (PROVENTIL HFA;VENTOLIN HFA) 108 (90 BASE) MCG/ACT inhaler   Inhalation   Inhale 2 puffs into the lungs as needed.         . citalopram (CELEXA) 10 MG tablet               . clobetasol cream (TEMOVATE) 0.05 %   Topical   Apply topically 2 (two) times daily.         . diclofenac (VOLTAREN) 75 MG EC tablet   Oral   Take 75 mg by mouth daily.         [name] escitalopram (LEXAPRO) 10 MG tablet   Oral   Take 10 mg by mouth daily.         . fluticasone (FLONASE) 50 MCG/ACT nasal spray   Nasal   Place 1 spray into the nose daily as needed. Allergies   16 g   6   . hydrOXYzine (ATARAX/VISTARIL) 25 MG tablet   Oral   Take 25 mg by mouth daily as needed for itching.          [name] levofloxacin (LEVAQUIN) 500 MG tablet   Oral   Take 500 mg by mouth daily.         . nortriptyline (PAMELOR) 25 MG capsule   Oral   Take 25 mg by mouth at bedtime.         . potassium chloride (K-DUR) 10 MEQ tablet   Oral   Take 20 mEq by mouth daily.         . potassium chloride (K-DUR,KLOR-CON) 10 MEQ tablet      TAKE 2 TABLETS BY MOUTH EVERY DAY   60 tablet   3   . traMADol (ULTRAM) 50 MG tablet   Oral   Take 1 tablet (50 mg total) by mouth every 8 (eight) hours as needed.   90 tablet   4   . zolpidem (AMBIEN) 10 MG tablet   Oral   Take 1 tablet (10 mg total) by mouth at bedtime as needed for sleep.   30 tablet   4     BP 133/78  Pulse 76  Temp(Src) 97.4 F (36.3 C) (Oral)  Resp 18  Ht 6\' 1"  (1.854 m)  Wt 255 lb (115.667 kg)  BMI 33.65 kg/m2  SpO2 94%  Physical Exam  Nursing note and vitals reviewed. Constitutional: He is oriented to person, place, and time. He appears well-developed  and well-nourished. No distress.  HENT:  Head: Normocephalic and atraumatic.  Eyes: Conjunctivae are normal.  Neck: Neck supple.  Cardiovascular: Normal rate, regular rhythm and normal heart sounds.    Pulmonary/Chest: Effort normal and breath sounds normal. No respiratory distress. He has no wheezes. He has no rales.  Abdominal: Soft. Bowel sounds are normal. He exhibits no distension. There is no tenderness. There is no rebound and no guarding.  Musculoskeletal:  Midline lumbar spine tenderness. No paravertebral tenderness. No pain with straight leg raise. Pelvis normal. No cervical or thoracic spine tenderness. Left lower leg swelling over distal tib fib and ankle. Pain with ROM. There is a scabbed area to the distal shin, with mild surrounding erythema.   Neurological: He is alert and oriented to person, place, and time.  5/5 and equal lower extremity strength. 2+ and equal patellar reflexes bilaterally. Pt able to dorsiflex bilateral toes and feet with good strength against resistance. Equal sensation bilaterally over thighs and lower legs. Normal dorsal pedal pulses bilaterally.    Skin: Skin is warm and dry.  Psychiatric: He has a normal mood and affect. His behavior is normal.    ED Course  Procedures (including critical care time)  Results for orders placed during the hospital encounter of 04/27/13  PROTIME-INR      Result Value Range   Prothrombin Time 28.2 (*) 11.6 - 15.2 seconds   INR 2.82 (*) 0.00 - 1.49  CBC WITH DIFFERENTIAL      Result Value Range   WBC 9.2  4.0 - 10.5 K/uL   RBC 5.01  4.22 - 5.81 MIL/uL   Hemoglobin 15.3  13.0 - 17.0 g/dL   HCT 19.1  47.8 - 29.5 %   MCV 88.6  78.0 - 100.0 fL   MCH 30.5  26.0 - 34.0 pg   MCHC 34.5  30.0 - 36.0 g/dL   RDW 62.1  30.8 - 65.7 %   Platelets 230  150 - 400 K/uL   Neutrophils Relative % 82 (*) 43 - 77 %   Neutro Abs 7.5  1.7 - 7.7 K/uL   Lymphocytes Relative 11 (*) 12 - 46 %   Lymphs Abs 1.0  0.7 - 4.0 K/uL   Monocytes Relative 5  3 - 12 %   Monocytes Absolute 0.5  0.1 - 1.0 K/uL   Eosinophils Relative 2  0 - 5 %   Eosinophils Absolute 0.2  0.0 - 0.7 K/uL   Basophils Relative 0  0 - 1 %   Basophils Absolute 0.0   0.0 - 0.1 K/uL  BASIC METABOLIC PANEL      Result Value Range   Sodium 139  135 - 145 mEq/L   Potassium 4.5  3.5 - 5.1 mEq/L   Chloride 103  96 - 112 mEq/L   CO2 29  19 - 32 mEq/L   Glucose, Bld 118 (*) 70 - 99 mg/dL   BUN 14  6 - 23 mg/dL   Creatinine, Ser 8.46  0.50 - 1.35 mg/dL   Calcium 9.5  8.4 - 96.2 mg/dL   GFR calc non Af Amer 68 (*) >90 mL/min   GFR calc Af Amer 79 (*) >90 mL/min   Dg Lumbar Spine Complete  04/27/2013   *RADIOLOGY REPORT*  Clinical Data: Fall from tractor.  Low back pain.  LUMBAR SPINE - COMPLETE 4+ VIEW  Comparison: None.  Findings: No evidence of acute fracture, spondylolysis, or spondylolisthesis.  Mild to moderate degenerative disc disease with vacuum  disc phenomenon seen at levels of L3-4, L4-5, and L5-S1.  No evidence of facet arthropathy other significant bone abnormality. Atherosclerotic calcification of abdominal aorta seen, without definite evidence of aneurysm.  IMPRESSION:  1.  No acute findings. 2.  Mild to moderate lower lumbar degenerative disc disease.   Original Report Authenticated By: Myles Rosenthal, M.D.   Dg Ankle Complete Left  04/27/2013   *RADIOLOGY REPORT*  Clinical Data: Back pain, leg pain  LEFT ANKLE COMPLETE - 3+ VIEW  Comparison: None.  Findings: Three views of the left ankle submitted.  No acute fracture or subluxation.  Ankle mortise is preserved.  Plantar spur of the calcaneus.  IMPRESSION: No acute fracture or subluxation.  Small plantar spur of the calcaneus.   Original Report Authenticated By: Natasha Mead, M.D.    4:38 PM Attempted to ambulate pt. Able to stand up but unable to take any steps. Got in severe pain, needed to lay back down. Also reported feeling dizzy. Was unable to sit on the stretcher. Pt also unable to have his head of the bed elevated. Will get CT lumbar spine. ECG obtained due to dizziness. Morphine ordered for pain.    Date: 04/27/2013  Rate: 74  Rhythm: atrial fibrillation  QRS Axis: left  Intervals: normal   ST/T Wave abnormalities: normal  Conduction Disutrbances:none  Narrative Interpretation:   Old EKG Reviewed: none available    1. Back pain       MDM  Pt here with lower back pain after being jerked while on a tractor, when tractor tipped over and back down . Pt has no neuro deficits, however pain is severe, unable to stand up or walk.    4:52 PM Pt signed out to Dr. Ethelda Chick at shift change pending CT lumbar spine. Morphine 4mg  ivp ordered. Will need reassessment, ambulation if CT negative.        Lottie Mussel, PA-C 04/28/13 1657

## 2013-04-27 NOTE — ED Notes (Signed)
LSB removed by PA. c-collar remains in place.

## 2013-04-27 NOTE — ED Notes (Signed)
Pt presents to department via [facility] for evaluation of back pain. Was working with tractor this afternoon and twisted back when tractor moved. 3/10 pain, becomes worse with movement. Pt is conscious alert and oriented x4. CBG 151.

## 2013-04-27 NOTE — ED Provider Notes (Signed)
6:45 PM pain is not well controlled after intravenous pain medicine. He is comfortable when lying supine however upon attempting to sit up he has exquisite pain in his lower back. I've spoken with Dr.Doutova who will arrange for inpatient stay for pain control  Date: 04/27/2013  Rate: 75  Rhythm: atrial fibrillation  QRS Axis: left  Intervals: normal  ST/T Wave abnormalities: nonspecific T wave changes  Conduction Disutrbances:none  Narrative Interpretation:   Old EKG Reviewed: Tracing from 04/03/2012 showed atrial fibrillation approximately 95 beats per minute otherwise no significant change interpreted by me Results for orders placed during the hospital encounter of 04/27/13  PROTIME-INR      Result Value Range   Prothrombin Time 28.2 (*) 11.6 - 15.2 seconds   INR 2.82 (*) 0.00 - 1.49  CBC WITH DIFFERENTIAL      Result Value Range   WBC 9.2  4.0 - 10.5 K/uL   RBC 5.01  4.22 - 5.81 MIL/uL   Hemoglobin 15.3  13.0 - 17.0 g/dL   HCT 16.1  09.6 - 04.5 %   MCV 88.6  78.0 - 100.0 fL   MCH 30.5  26.0 - 34.0 pg   MCHC 34.5  30.0 - 36.0 g/dL   RDW 40.9  81.1 - 91.4 %   Platelets 230  150 - 400 K/uL   Neutrophils Relative % 82 (*) 43 - 77 %   Neutro Abs 7.5  1.7 - 7.7 K/uL   Lymphocytes Relative 11 (*) 12 - 46 %   Lymphs Abs 1.0  0.7 - 4.0 K/uL   Monocytes Relative 5  3 - 12 %   Monocytes Absolute 0.5  0.1 - 1.0 K/uL   Eosinophils Relative 2  0 - 5 %   Eosinophils Absolute 0.2  0.0 - 0.7 K/uL   Basophils Relative 0  0 - 1 %   Basophils Absolute 0.0  0.0 - 0.1 K/uL  BASIC METABOLIC PANEL      Result Value Range   Sodium 139  135 - 145 mEq/L   Potassium 4.5  3.5 - 5.1 mEq/L   Chloride 103  96 - 112 mEq/L   CO2 29  19 - 32 mEq/L   Glucose, Bld 118 (*) 70 - 99 mg/dL   BUN 14  6 - 23 mg/dL   Creatinine, Ser 7.82  0.50 - 1.35 mg/dL   Calcium 9.5  8.4 - 95.6 mg/dL   GFR calc non Af Amer 68 (*) >90 mL/min   GFR calc Af Amer 79 (*) >90 mL/min  URINALYSIS, ROUTINE W REFLEX MICROSCOPIC      Result Value Range   Color, Urine YELLOW  YELLOW   APPearance CLEAR  CLEAR   Specific Gravity, Urine 1.013  1.005 - 1.030   pH 5.0  5.0 - 8.0   Glucose, UA NEGATIVE  NEGATIVE mg/dL   Hgb urine dipstick SMALL (*) NEGATIVE   Bilirubin Urine NEGATIVE  NEGATIVE   Ketones, ur NEGATIVE  NEGATIVE mg/dL   Protein, ur NEGATIVE  NEGATIVE mg/dL   Urobilinogen, UA 0.2  0.0 - 1.0 mg/dL   Nitrite NEGATIVE  NEGATIVE   Leukocytes, UA NEGATIVE  NEGATIVE  URINE MICROSCOPIC-ADD ON      Result Value Range   Squamous Epithelial / LPF RARE  RARE   RBC / HPF 0-2  <3 RBC/hpf   Casts HYALINE CASTS (*) NEGATIVE   Dg Lumbar Spine Complete  04/27/2013   *RADIOLOGY REPORT*  Clinical Data: Fall from tractor.  Low back pain.  LUMBAR SPINE - COMPLETE 4+ VIEW  Comparison: None.  Findings: No evidence of acute fracture, spondylolysis, or spondylolisthesis.  Mild to moderate degenerative disc disease with vacuum disc phenomenon seen at levels of L3-4, L4-5, and L5-S1.  No evidence of facet arthropathy other significant bone abnormality. Atherosclerotic calcification of abdominal aorta seen, without definite evidence of aneurysm.  IMPRESSION:  1.  No acute findings. 2.  Mild to moderate lower lumbar degenerative disc disease.   Original Report Authenticated By: Myles Rosenthal, M.D.   Dg Ankle Complete Left  04/27/2013   *RADIOLOGY REPORT*  Clinical Data: Back pain, leg pain  LEFT ANKLE COMPLETE - 3+ VIEW  Comparison: None.  Findings: Three views of the left ankle submitted.  No acute fracture or subluxation.  Ankle mortise is preserved.  Plantar spur of the calcaneus.  IMPRESSION: No acute fracture or subluxation.  Small plantar spur of the calcaneus.   Original Report Authenticated By: Natasha Mead, M.D.   Ct Lumbar Spine Wo Contrast  04/27/2013   *RADIOLOGY REPORT*  Clinical Data: Back pain after twisting injury.  CT LUMBAR SPINE WITHOUT CONTRAST  Technique:  Multidetector CT imaging of the lumbar spine was performed without  intravenous contrast administration. Multiplanar CT image reconstructions were also generated.  Comparison: Plain films lumbar spine earlier this same day.  CT abdomen and pelvis 06/24/2010.  Findings: There is no fracture or subluxation of the lumbar spine with vertebral body height and alignment maintained throughout. Small Schmorl's node in the superior endplate of L4 is new since the prior study.  Imaged paraspinous structures demonstrate scattered atherosclerotic vascular disease.  T11-12:  Negative.  T12-L1:  Negative.  L1-2:  There is some facet degenerative change.  No disc bulge or protrusion.  Central canal and foramina are open.  L2-3:  Facet arthropathy is identified.  Minimal disc bulge without central canal or foraminal narrowing.  L3-4:  There is facet arthropathy and a shallow disc bulge.  Vacuum disc phenomenon is noted.  The central canal and foramina appear open.  L4-5:  There is facet degenerative disease.  Shallow disc bulge eccentric to the left and ligamentum flavum thickening are identified.  The central canal is mildly to moderately narrowed. There is also mild to moderate left foraminal narrowing.  The right foramen is open.  L5-S1:  Shallow disc bulge.  The central canal and foramina are patent.  IMPRESSION:  1.  Negative for fracture or other acute abnormality.  2.  Degenerative disc disease appearing most notable at L4-5 where there is mild to moderate central canal and left foraminal narrowing.   Original Report Authenticated By: Holley Dexter, M.D.     Doug Sou, MD 04/27/13 Ernestina Columbia

## 2013-04-28 DIAGNOSIS — N401 Enlarged prostate with lower urinary tract symptoms: Secondary | ICD-10-CM

## 2013-04-28 DIAGNOSIS — M549 Dorsalgia, unspecified: Secondary | ICD-10-CM | POA: Diagnosis not present

## 2013-04-28 DIAGNOSIS — N139 Obstructive and reflux uropathy, unspecified: Secondary | ICD-10-CM

## 2013-04-28 DIAGNOSIS — E119 Type 2 diabetes mellitus without complications: Secondary | ICD-10-CM | POA: Diagnosis not present

## 2013-04-28 DIAGNOSIS — I4891 Unspecified atrial fibrillation: Secondary | ICD-10-CM | POA: Diagnosis not present

## 2013-04-28 DIAGNOSIS — M7989 Other specified soft tissue disorders: Secondary | ICD-10-CM

## 2013-04-28 LAB — COMPREHENSIVE METABOLIC PANEL
ALT: 15 U/L (ref 0–53)
Alkaline Phosphatase: 97 U/L (ref 39–117)
BUN: 13 mg/dL (ref 6–23)
CO2: 28 mEq/L (ref 19–32)
Chloride: 98 mEq/L (ref 96–112)
GFR calc Af Amer: 81 mL/min — ABNORMAL LOW (ref >90)
GFR calc non Af Amer: 70 mL/min — ABNORMAL LOW (ref >90)
Glucose, Bld: 147 mg/dL — ABNORMAL HIGH (ref 70–99)
Potassium: 4.1 mEq/L (ref 3.5–5.1)
Sodium: 135 mEq/L (ref 135–145)
Total Bilirubin: 0.7 mg/dL (ref 0.3–1.2)

## 2013-04-28 LAB — GLUCOSE, CAPILLARY
Glucose-Capillary: 166 mg/dL — ABNORMAL HIGH (ref 70–99)
Glucose-Capillary: 150 mg/dL — ABNORMAL HIGH (ref 70–99)

## 2013-04-28 LAB — CBC
HCT: 43.7 % (ref 39.0–52.0)
MCHC: 34.3 g/dL (ref 30.0–36.0)
Platelets: 203 10*3/uL (ref 150–400)
RDW: 13.5 % (ref 11.5–15.5)
WBC: 8.3 10*3/uL (ref 4.0–10.5)

## 2013-04-28 LAB — PROTIME-INR
INR: 3.1 — ABNORMAL HIGH (ref 0.00–1.49)
Prothrombin Time: 30.3 seconds — ABNORMAL HIGH (ref 11.6–15.2)

## 2013-04-28 LAB — HEMOGLOBIN A1C: Hgb A1c MFr Bld: 6.7 % — ABNORMAL HIGH (ref <5.7)

## 2013-04-28 MED ORDER — METHOCARBAMOL 500 MG PO TABS
500.0000 mg | ORAL_TABLET | Freq: Three times a day (TID) | ORAL | Status: DC
  Administered 2013-04-28 – 2013-04-30 (×7): 500 mg via ORAL
  Filled 2013-04-28 (×9): qty 1

## 2013-04-28 MED ORDER — WARFARIN SODIUM 2 MG PO TABS
2.0000 mg | ORAL_TABLET | Freq: Once | ORAL | Status: AC
  Administered 2013-04-28: 2 mg via ORAL
  Filled 2013-04-28: qty 1

## 2013-04-28 NOTE — Progress Notes (Signed)
ANTICOAGULATION CONSULT NOTE   Pharmacy Consult for coumadin Indication: atrial fibrillation   Labs:  Recent Labs  04/27/13 1442 04/28/13 0610  HGB 15.3 15.0  HCT 44.4 43.7  PLT 230 203  LABPROT 28.2* 30.3*  INR 2.82* 3.10*  CREATININE 1.02 1.00    Estimated Creatinine Clearance: 81.6 ml/min (by C-G formula based on Cr of 1).  Assessment: 79 yo male here with back pain (s/p tractor accident) on coumadin PTA for afib to continue while admitted. INR on admission was 2.82. It is now up to 3.1 this morning. No bleeding noted, no surgeries planned. CBC has remained stable.  Home coumadin dose: 5mg /day per patient  Goal of Therapy:  INR 2-3 Monitor platelets by anticoagulation protocol: Yes   Plan:  -Coumadin 2 mg po today -PT/INR daily  Sheppard Coil PharmD., BCPS Clinical Pharmacist Pager [phone] 04/28/2013 1:50 PM

## 2013-04-28 NOTE — Progress Notes (Signed)
VASCULAR LAB PRELIMINARY  PRELIMINARY  PRELIMINARY  PRELIMINARY  Left lower extremity venous Doppler completed.    Preliminary report:  There is no DVT or SVT noted in the left lower extremity.  Rosealie Reach, RVT 04/28/2013, 9:30 AM

## 2013-04-28 NOTE — Plan of Care (Signed)
Problem: Phase II Progression Outcomes Goal: Progress activity as tolerated unless otherwise ordered Outcome: Progressing PT consulted

## 2013-04-28 NOTE — Progress Notes (Signed)
[facility] PROGRESS NOTE  [name] [name] WUJ:[id number] DOB: 1932/05/01 DOA: 04/27/2013 PCP: Lilyan Punt, MD  HPI/Subjective: Continues to have low back pain, likely lumbar sprain.   Assessment/Plan:  Low back pain -CT scan of lumbar spine did not show any evidence of fracture. -Low back pain secondary to lumbar sprain, continue pain medications and Robaxin. -If pain did not get better quickly we might add steroid taper (doesn't seem like there is a lot of inflammation)  Acute pain -Secondary to lumbar sprain, continue narcotics, NSAIDs and muscle relaxants.  Hypertension -Continue preadmission oral medications.   Diabetes mellitus type 2 -Oral hypoglycemics held, patient started on insulin sliding scale and carbohydrate modified diet.  Atrial fibrillation -Rate controlled with Toprol-XL, patient is on telemetry. -Coumadin for thromboembolic risk prevention.  Code Status: Full code Family Communication: Discussed with the patient Disposition Plan: Remain inpatient   Consultants:  None  Procedures:  None  Antibiotics:  None    Objective: Filed Vitals:   04/27/13 2139 04/28/13 0206 04/28/13 0459 04/28/13 0945  BP: 145/61 124/60 131/62 131/71  Pulse: 73 98 92 88  Temp: 97.3 F (36.3 C) 97.4 F (36.3 C) 98.1 F (36.7 C) 97.7 F (36.5 C)  TempSrc: Oral Oral Oral Oral  Resp: 18 18 18 18   Height: 6' 1.5" (1.867 m)     Weight: 115.3 kg (254 lb 3.1 oz)     SpO2: 96% 91% 90% 90%    Intake/Output Summary (Last 24 hours) at 04/28/13 1057 Last data filed at 04/28/13 0900  Gross per 24 hour  Intake    243 ml  Output    100 ml  Net    143 ml   Filed Weights   04/27/13 1352 04/27/13 2139  Weight: 115.667 kg (255 lb) 115.3 kg (254 lb 3.1 oz)    Exam: General: Alert and awake, oriented x3, not in any acute distress. HEENT: anicteric sclera, pupils reactive to light and accommodation, EOMI CVS: S1-S2 clear, no murmur rubs or gallops Chest: clear to  auscultation bilaterally, no wheezing, rales or rhonchi Abdomen: soft nontender, nondistended, normal bowel sounds, no organomegaly Extremities: no cyanosis, clubbing or edema noted bilaterally Neuro: Cranial nerves II-XII intact, no focal neurological deficits  Data Reviewed: Basic Metabolic Panel:  Recent Labs Lab 04/27/13 1442 04/28/13 0610  NA 139 135  K 4.5 4.1  CL 103 98  CO2 29 28  GLUCOSE 118* 147*  BUN 14 13  CREATININE 1.02 1.00  CALCIUM 9.5 9.0  MG  --  1.9  PHOS  --  3.1   Liver Function Tests:  Recent Labs Lab 04/28/13 0610  AST 14  ALT 15  ALKPHOS 97  BILITOT 0.7  PROT 6.9  ALBUMIN 3.4*   No results found for this basename: LIPASE, AMYLASE,  in the last 168 hours No results found for this basename: AMMONIA,  in the last 168 hours CBC:  Recent Labs Lab 04/27/13 1442 04/28/13 0610  WBC 9.2 8.3  NEUTROABS 7.5  --   HGB 15.3 15.0  HCT 44.4 43.7  MCV 88.6 89.2  PLT 230 203   Cardiac Enzymes: No results found for this basename: CKTOTAL, CKMB, CKMBINDEX, TROPONINI,  in the last 168 hours BNP (last 3 results) No results found for this basename: PROBNP,  in the last 8760 hours CBG:  Recent Labs Lab 04/27/13 2130 04/28/13 0729  GLUCAP 102* 166*    No results found for this or any previous visit (from the past 240 hour(s)).  Studies: Dg Lumbar Spine Complete  04/27/2013   *RADIOLOGY REPORT*  Clinical Data: Fall from tractor.  Low back pain.  LUMBAR SPINE - COMPLETE 4+ VIEW  Comparison: None.  Findings: No evidence of acute fracture, spondylolysis, or spondylolisthesis.  Mild to moderate degenerative disc disease with vacuum disc phenomenon seen at levels of L3-4, L4-5, and L5-S1.  No evidence of facet arthropathy other significant bone abnormality. Atherosclerotic calcification of abdominal aorta seen, without definite evidence of aneurysm.  IMPRESSION:  1.  No acute findings. 2.  Mild to moderate lower lumbar degenerative disc disease.    Original Report Authenticated By: Myles Rosenthal, M.D.   Dg Ankle Complete Left  04/27/2013   *RADIOLOGY REPORT*  Clinical Data: Back pain, leg pain  LEFT ANKLE COMPLETE - 3+ VIEW  Comparison: None.  Findings: Three views of the left ankle submitted.  No acute fracture or subluxation.  Ankle mortise is preserved.  Plantar spur of the calcaneus.  IMPRESSION: No acute fracture or subluxation.  Small plantar spur of the calcaneus.   Original Report Authenticated By: Natasha Mead, M.D.   Ct Lumbar Spine Wo Contrast  04/27/2013   *RADIOLOGY REPORT*  Clinical Data: Back pain after twisting injury.  CT LUMBAR SPINE WITHOUT CONTRAST  Technique:  Multidetector CT imaging of the lumbar spine was performed without intravenous contrast administration. Multiplanar CT image reconstructions were also generated.  Comparison: Plain films lumbar spine earlier this same day.  CT abdomen and pelvis 06/24/2010.  Findings: There is no fracture or subluxation of the lumbar spine with vertebral body height and alignment maintained throughout. Small Schmorl's node in the superior endplate of L4 is new since the prior study.  Imaged paraspinous structures demonstrate scattered atherosclerotic vascular disease.  T11-12:  Negative.  T12-L1:  Negative.  L1-2:  There is some facet degenerative change.  No disc bulge or protrusion.  Central canal and foramina are open.  L2-3:  Facet arthropathy is identified.  Minimal disc bulge without central canal or foraminal narrowing.  L3-4:  There is facet arthropathy and a shallow disc bulge.  Vacuum disc phenomenon is noted.  The central canal and foramina appear open.  L4-5:  There is facet degenerative disease.  Shallow disc bulge eccentric to the left and ligamentum flavum thickening are identified.  The central canal is mildly to moderately narrowed. There is also mild to moderate left foraminal narrowing.  The right foramen is open.  L5-S1:  Shallow disc bulge.  The central canal and foramina are  patent.  IMPRESSION:  1.  Negative for fracture or other acute abnormality.  2.  Degenerative disc disease appearing most notable at L4-5 where there is mild to moderate central canal and left foraminal narrowing.   Original Report Authenticated By: Holley Dexter, M.D.    Scheduled Meds: . citalopram  10 mg Oral Daily  . docusate sodium  100 mg Oral BID  . furosemide  20 mg Oral Daily  . insulin aspart  0-15 Units Subcutaneous TID WC  . insulin aspart  0-5 Units Subcutaneous QHS  . metoprolol succinate  25 mg Oral Daily  . pantoprazole  40 mg Oral Q1200  . senna  1 tablet Oral BID  . sodium chloride  3 mL Intravenous Q12H  . sodium chloride  3 mL Intravenous Q12H  . Warfarin - Pharmacist Dosing Inpatient   Does not apply q1800   Continuous Infusions:   Active Problems:   Hypertension   Atrial fibrillation   Diabetes mellitus  Long term (current) use of anticoagulants   Back pain   Leg edema, left    Time spent: 35 minutes    [facility]  [facility] Pager [phone]. If 7PM-7AM, please contact night-coverage at [email or web address], password [facility] 04/28/2013, 10:57 AM  LOS: 1 day

## 2013-04-29 DIAGNOSIS — I4891 Unspecified atrial fibrillation: Secondary | ICD-10-CM | POA: Diagnosis not present

## 2013-04-29 DIAGNOSIS — I10 Essential (primary) hypertension: Secondary | ICD-10-CM | POA: Diagnosis not present

## 2013-04-29 DIAGNOSIS — E119 Type 2 diabetes mellitus without complications: Secondary | ICD-10-CM | POA: Diagnosis not present

## 2013-04-29 DIAGNOSIS — M549 Dorsalgia, unspecified: Secondary | ICD-10-CM | POA: Diagnosis not present

## 2013-04-29 LAB — GLUCOSE, CAPILLARY
Glucose-Capillary: 151 mg/dL — ABNORMAL HIGH (ref 70–99)
Glucose-Capillary: 119 mg/dL — ABNORMAL HIGH (ref 70–99)

## 2013-04-29 LAB — PROTIME-INR: INR: 3.09 — ABNORMAL HIGH (ref 0.00–1.49)

## 2013-04-29 MED ORDER — NICOTINE 14 MG/24HR TD PT24
14.0000 mg | MEDICATED_PATCH | Freq: Every day | TRANSDERMAL | Status: DC
  Administered 2013-04-29 – 2013-04-30 (×2): 14 mg via TRANSDERMAL
  Filled 2013-04-29 (×2): qty 1

## 2013-04-29 MED ORDER — WARFARIN SODIUM 2 MG PO TABS
2.0000 mg | ORAL_TABLET | Freq: Once | ORAL | Status: AC
  Administered 2013-04-29: 2 mg via ORAL
  Filled 2013-04-29: qty 1

## 2013-04-29 NOTE — Clinical Social Work Placement (Addendum)
Clinical Social Work Department CLINICAL SOCIAL WORK PLACEMENT NOTE 04/29/2013  Patient:  [name] [name], [name] [name]  Account Number:  [id number] Admit date:  04/27/2013  Clinical Social Worker:  Genelle Bal, LCSW  Date/time:  04/29/2013 04:39 AM  Clinical Social Work is seeking post-discharge placement for this patient at the following level of care:   SKILLED NURSING   (*CSW will update this form in Epic as items are completed)     Patient/family provided with [facility] Department of Clinical Social Work's list of facilities offering this level of care within the geographic area requested by the patient (or if unable, by the patient's family).  04/29/2013  Patient/family informed of their freedom to choose among providers that offer the needed level of care, that participate in Medicare, Medicaid or managed care program needed by the patient, have an available bed and are willing to accept the patient.  04/29/2013  Patient/family informed of [facility]' ownership interest in [facility], as well as of the fact that they are under no obligation to receive care at this facility.  PASARR submitted to EDS on 04/29/2013 PASARR number received from EDS on 04/29/2013 - [id number]  FL2 transmitted to all facilities in geographic area requested by pt/family on  04/29/2013 FL2 transmitted to all facilities within larger geographic area on   Patient informed that his/her managed care company has contracts with or will negotiate with  certain facilities, including the following:     Patient/family informed of bed offers received:  04/30/13 Patient chooses bed at [facility] Physician recommends and patient chooses bed at    Patient to be transferred to [facility] on 04/30/13   Patient to be transferred to facility by ambulance  The following physician request were entered in Epic:   Additional Comments: 04/30/13 - Patient's son-in-law completed admissions paperwork at [facility].

## 2013-04-29 NOTE — Progress Notes (Signed)
ANTICOAGULATION CONSULT NOTE   Pharmacy Consult for coumadin Indication: atrial fibrillation   Labs:  Recent Labs  04/27/13 1442 04/28/13 0610 04/29/13 0510  HGB 15.3 15.0  --   HCT 44.4 43.7  --   PLT 230 203  --   LABPROT 28.2* 30.3* 30.2*  INR 2.82* 3.10* 3.09*  CREATININE 1.02 1.00  --     Estimated Creatinine Clearance: 81.6 ml/min (by C-G formula based on Cr of 1).  Assessment: 79 yo male here with back pain (s/p tractor accident) on coumadin PTA for afib to continue while admitted. INR on admission was 2.82. It was up to 3.1 6/15 and lower dose Coumadin 2mg  given last pm. No bleeding noted, no surgeries planned. CBC has remained stable. INR today 3.09.  Will repeat lower dose of Coumadin to allow INR to drop slightly.  Home coumadin dose: 5mg /day per patient  Goal of Therapy:  INR 2-3 Monitor platelets by anticoagulation protocol: Yes   Plan:  -Coumadin 2 mg po today -PT/INR daily  Leota Sauers Pharm.D. CPP, BCPS Clinical Pharmacist [phone] 04/29/2013 8:24 AM

## 2013-04-29 NOTE — ED Provider Notes (Signed)
Medical screening examination/treatment/procedure(s) were conducted as a shared visit with non-physician practitioner(s) and myself.  I personally evaluated the patient during the encounter  Low back pain after jerking injury on tractor. No weakness, numbness, tingling, no incontinence. LLE swelling and erythema from trauma 2 weeks ago. Midline lumbar spine tenderness. 5/5 strength in bilateral lower extremities. Ankle plantar and dorsiflexion intact. Great toe extension intact bilaterally. +2 DP and PT pulses. +2 patellar reflexes bilaterally.  No pain with lying down. Unable to stand up or walk.   Glynn Octave, MD 04/29/13 [phone]

## 2013-04-29 NOTE — Care Management Note (Signed)
    Page 1 of 1   05/01/2013     10:38:20 AM   CARE MANAGEMENT NOTE 05/01/2013  Patient:  [name] [name], [name] [name]   Account Number:  [id number]  Date Initiated:  04/29/2013  Documentation initiated by:  Letha Cape  Subjective/Objective Assessment:   dx bback pain, ( fell off tractor)  admit- lives with spouse.     Action/Plan:   pt eval- rec snf   Anticipated DC Date:  04/30/2013   Anticipated DC Plan:  SKILLED NURSING FACILITY  In-house referral  Clinical Social Worker      DC Planning Services  CM consult      Choice offered to / List presented to:             Status of service:  Completed, signed off Medicare Important Message given?   (If response is "NO", the following Medicare IM given date fields will be blank) Date Medicare IM given:   Date Additional Medicare IM given:    Discharge Disposition:  SKILLED NURSING FACILITY  Per UR Regulation:  Reviewed for med. necessity/level of care/duration of stay  If discussed at Long Length of Stay Meetings, dates discussed:    Comments:  05/01/13 10:37 Letha Cape RN,BSN [phone] patient dc to snf , CSW following.  04/29/13 16:26 Letha Cape RN, BSN [phone] patient lives with spouse,  Per phsycial therapy recs snf, CSW referral.

## 2013-04-29 NOTE — Evaluation (Signed)
Physical Therapy Evaluation Patient Details Name: [name] [name] MRN: [id number] DOB: 08-Mar-1932 Today's Date: 04/29/2013 Time: 4098-1191 PT Time Calculation (min): 40 min  PT Assessment / Plan / Recommendation Clinical Impression    Pt admitted with back pain and a-fib. Pt currently with functional limitations due to the deficits listed below (PT Problem List). Pt will benefit from skilled PT to increase their independence and safety with mobility to allow discharge possibly to ST-SNF prior to return home.   According to pt and POA pt also has long term care insurance and has the option of ALF with PT.     PT Assessment       Follow Up Recommendations  SNF (Per pt/POA the pt has long term care insurance and could possibly go there with PT)    Does the patient have the potential to tolerate intense rehabilitation      Barriers to Discharge Decreased caregiver support      Equipment Recommendations  Rolling walker with 5" wheels    Recommendations for Other Services     Frequency Min 3X/week    Precautions / Restrictions Precautions Precautions: Fall Restrictions Weight Bearing Restrictions: No   Pertinent Vitals/Pain See flow sheet      Mobility  Bed Mobility Bed Mobility: Rolling Left;Rolling Right Rolling Right: 3: Mod assist;With rail Rolling Left: 3: Mod assist;With rail Transfers Transfers: Sit to Stand;Stand to Sit Sit to Stand: 1: +2 Total assist;With upper extremity assist;From bed Sit to Stand: Patient Percentage: 70% Stand to Sit: 4: Min assist;With upper extremity assist;With armrests;To chair/3-in-1;To bed Details for Transfer Assistance: Verbal cues for hand placement and assist to bring hips up. Ambulation/Gait Ambulation/Gait Assistance: 4: Min assist Ambulation Distance (Feet): 90 Feet (x 2) Assistive device: Rolling walker Ambulation/Gait Assistance Details: Verbal cues to stay closer to walker. Gait Pattern: Step-through pattern;Decreased step  length - right;Decreased step length - left;Trunk flexed Gait velocity: decr    Exercises     PT Diagnosis: Difficulty walking;Acute pain  PT Problem List: Decreased strength;Decreased activity tolerance;Decreased mobility;Decreased knowledge of use of DME;Pain PT Treatment Interventions: DME instruction;Gait training;Patient/family education;Functional mobility training;Therapeutic activities;Therapeutic exercise   PT Goals Acute Rehab PT Goals PT Goal Formulation: With patient Time For Goal Achievement: 05/06/13 Potential to Achieve Goals: Good Pt will Roll Supine to Left Side: with supervision PT Goal: Rolling Supine to Left Side - Progress: Goal set today Pt will go Supine/Side to Sit: with supervision PT Goal: Supine/Side to Sit - Progress: Goal set today Pt will go Sit to Stand: with supervision PT Goal: Sit to Stand - Progress: Goal set today Pt will go Stand to Sit: with supervision PT Goal: Stand to Sit - Progress: Goal set today Pt will Ambulate: 51 - 150 feet;with supervision;with least restrictive assistive device PT Goal: Ambulate - Progress: Goal set today Pt will Go Up / Down Stairs: 3-5 stairs;with min assist;with rail(s) PT Goal: Up/Down Stairs - Progress: Goal set today  Visit Information  Last PT Received On: 04/29/13 Assistance Needed: +2    Subjective Data  Patient Stated Goal: Return home   Prior Functioning  Home Living Lives With: Spouse (Pt takes care of spouse. Hired caregivers currently for wife but she can't provide the physical assist pt needs currently ) Type of Home: House Home Access: Stairs to enter [employer] of Steps: 6 Entrance Stairs-Rails: Right Home Layout: Laundry or work area in [employer] Equipment: None Prior Function Level of Independence: Independent Able to Take Stairs?: Yes  Driving: Yes Vocation: Retired Musician: No difficulties    Copywriter, advertising Arousal/Alertness:  Awake/alert Behavior During Therapy: WFL for tasks assessed/performed Overall Cognitive Status: Within Functional Limits for tasks assessed    Extremity/Trunk Assessment Right Lower Extremity Assessment RLE ROM/Strength/Tone: Deficits RLE ROM/Strength/Tone Deficits: grossly 4/5 due to pain Left Lower Extremity Assessment LLE ROM/Strength/Tone: Deficits LLE ROM/Strength/Tone Deficits: grossly 4/5 due to pain   Balance Balance Balance Assessed: Yes Static Standing Balance Static Standing - Balance Support: Bilateral upper extremity supported Static Standing - Level of Assistance: 5: Stand by assistance  End of Session PT - End of Session Equipment Utilized During Treatment: Gait belt Activity Tolerance: Patient limited by pain Patient left: in bed;with call bell/phone within reach;with bed alarm set;with family/visitor present Nurse Communication: Mobility status  GP     Josemiguel Gries 04/29/2013, 11:24 AM  Skip Mayer PT [phone]

## 2013-04-29 NOTE — Progress Notes (Signed)
[facility] PROGRESS NOTE  [name] [name] GEX:[id number] DOB: 1932/10/28 DOA: 04/27/2013 PCP: Lilyan Punt, MD  Assessment/Plan: Low back pain  -CT scan of lumbar spine did not show any evidence of fracture.  -Low back pain secondary to lumbar sprain, continue pain medications and Robaxin.  -PT/OT consult to assess weakness, mobility - PT suggested SNF or rehab facility to improve strength and mobility   Acute pain  -Secondary to lumbar sprain, continue narcotics, NSAIDs and muscle relaxants.   Left Lower Extremity Pain - No DVT on duplex  Hypertension  - Stable - Continue preadmission oral medications.   Diabetes mellitus type 2  -Oral hypoglycemics held, patient started on insulin sliding scale and carbohydrate modified diet.   Atrial fibrillation  -Rate controlled with Toprol-XL, patient is on telemetry.  -Coumadin for thromboembolic risk prevention.   Code Status: Full Family Communication: POA present at bedside  Disposition Plan: likely need SNF versus assisted living facility on discharge.   Consultants:  PT/OT eval  Social Work consult - needs SNF or rehab facility  Procedures:  Lower Extremity Venous Duplex Left Summary:  - No evidence of deep vein or superficial thrombosis involving the left lower extremity and right common femoral vein. - No evidence of Baker's cyst on the left. Other specific details can be found in the table(s) above. Prepared and Electronically Authenticated by  Cari Caraway 2013-04-29   Antibiotics:  none  HPI/Subjective: Patient continues to improve. Pain is minimal when laying in bed however still significant when walking or sitting. He is able to roll in bed without assistance. He notes increased ability to move his legs. He is concerned about returning home without help. He denies chest pain, N/V, SOB.   Objective: Filed Vitals:   04/28/13 1414 04/28/13 2100 04/29/13 0205 04/29/13 0630  BP: 119/63 134/81  106/64 132/60  Pulse: 80 83 97 78  Temp: 98.3 F (36.8 C) 98.2 F (36.8 C) 99.9 F (37.7 C)   TempSrc: Oral Oral Oral   Resp: 18 20 20 20   Height:      Weight:      SpO2: 96% 96% 93% 95%    Intake/Output Summary (Last 24 hours) at 04/29/13 1040 Last data filed at 04/29/13 0930  Gross per 24 hour  Intake    620 ml  Output   1000 ml  Net   -380 ml   Filed Weights   04/27/13 1352 04/27/13 2139  Weight: 115.667 kg (255 lb) 115.3 kg (254 lb 3.1 oz)    Exam:   General:  Alert, Oriented x 3, laying in bed, pt is difficult to understand  Cardiovascular: S1-S2 clear, no m/g/r   Respiratory: clear to ascultation bilaterally, no wheezes, rales, or rhonchi   Abdomen: normoactive BS, soft, non-tender, non-distended  Musculoskeletal: low back pain limiting movement. L lower extremity has mild effusion likely secondary to wound on lower, medial pretibial area after a lawn mower incident 2 weeks ago. No signs of infection.  Data Reviewed: Basic Metabolic Panel:  Recent Labs Lab 04/27/13 1442 04/28/13 0610  NA 139 135  K 4.5 4.1  CL 103 98  CO2 29 28  GLUCOSE 118* 147*  BUN 14 13  CREATININE 1.02 1.00  CALCIUM 9.5 9.0  MG  --  1.9  PHOS  --  3.1   Liver Function Tests:  Recent Labs Lab 04/28/13 0610  AST 14  ALT 15  ALKPHOS 97  BILITOT 0.7  PROT 6.9  ALBUMIN 3.4*  CBC:  Recent  Labs Lab 04/27/13 1442 04/28/13 0610  WBC 9.2 8.3  NEUTROABS 7.5  --   HGB 15.3 15.0  HCT 44.4 43.7  MCV 88.6 89.2  PLT 230 203    CBG:  Recent Labs Lab 04/28/13 0729 04/28/13 1148 04/28/13 1735 04/28/13 2122 04/29/13 0829  GLUCAP 166* 143* 150* 167* 151*       Studies: Dg Lumbar Spine Complete  04/27/2013   *RADIOLOGY REPORT*  Clinical Data: Fall from tractor.  Low back pain.  LUMBAR SPINE - COMPLETE 4+ VIEW  Comparison: None.  Findings: No evidence of acute fracture, spondylolysis, or spondylolisthesis.  Mild to moderate degenerative disc disease with vacuum disc  phenomenon seen at levels of L3-4, L4-5, and L5-S1.  No evidence of facet arthropathy other significant bone abnormality. Atherosclerotic calcification of abdominal aorta seen, without definite evidence of aneurysm.  IMPRESSION:  1.  No acute findings. 2.  Mild to moderate lower lumbar degenerative disc disease.   Original Report Authenticated By: Myles Rosenthal, M.D.   Dg Ankle Complete Left  04/27/2013   *RADIOLOGY REPORT*  Clinical Data: Back pain, leg pain  LEFT ANKLE COMPLETE - 3+ VIEW  Comparison: None.  Findings: Three views of the left ankle submitted.  No acute fracture or subluxation.  Ankle mortise is preserved.  Plantar spur of the calcaneus.  IMPRESSION: No acute fracture or subluxation.  Small plantar spur of the calcaneus.   Original Report Authenticated By: Natasha Mead, M.D.   Ct Lumbar Spine Wo Contrast  04/27/2013   *RADIOLOGY REPORT*  Clinical Data: Back pain after twisting injury.  CT LUMBAR SPINE WITHOUT CONTRAST  Technique:  Multidetector CT imaging of the lumbar spine was performed without intravenous contrast administration. Multiplanar CT image reconstructions were also generated.  Comparison: Plain films lumbar spine earlier this same day.  CT abdomen and pelvis 06/24/2010.  Findings: There is no fracture or subluxation of the lumbar spine with vertebral body height and alignment maintained throughout. Small Schmorl's node in the superior endplate of L4 is new since the prior study.  Imaged paraspinous structures demonstrate scattered atherosclerotic vascular disease.  T11-12:  Negative.  T12-L1:  Negative.  L1-2:  There is some facet degenerative change.  No disc bulge or protrusion.  Central canal and foramina are open.  L2-3:  Facet arthropathy is identified.  Minimal disc bulge without central canal or foraminal narrowing.  L3-4:  There is facet arthropathy and a shallow disc bulge.  Vacuum disc phenomenon is noted.  The central canal and foramina appear open.  L4-5:  There is facet  degenerative disease.  Shallow disc bulge eccentric to the left and ligamentum flavum thickening are identified.  The central canal is mildly to moderately narrowed. There is also mild to moderate left foraminal narrowing.  The right foramen is open.  L5-S1:  Shallow disc bulge.  The central canal and foramina are patent.  IMPRESSION:  1.  Negative for fracture or other acute abnormality.  2.  Degenerative disc disease appearing most notable at L4-5 where there is mild to moderate central canal and left foraminal narrowing.   Original Report Authenticated By: Holley Dexter, M.D.    Scheduled Meds: . citalopram  10 mg Oral Daily  . docusate sodium  100 mg Oral BID  . furosemide  20 mg Oral Daily  . insulin aspart  0-15 Units Subcutaneous TID WC  . insulin aspart  0-5 Units Subcutaneous QHS  . methocarbamol  500 mg Oral TID  . metoprolol succinate  25  mg Oral Daily  . nicotine  14 mg Transdermal Daily  . pantoprazole  40 mg Oral Q1200  . senna  1 tablet Oral BID  . sodium chloride  3 mL Intravenous Q12H  . sodium chloride  3 mL Intravenous Q12H  . warfarin  2 mg Oral ONCE-1800  . Warfarin - Pharmacist Dosing Inpatient   Does not apply q1800    Active Problems:   Hypertension   Atrial fibrillation   Diabetes mellitus   Long term (current) use of anticoagulants   Back pain   Leg edema, left     Ralene Muskrat PA-S [facility]  04/29/2013, 10:40 AM  LOS: 2 days    Addendum:   I have seen and assessed the patient and agree with Antonietta Jewel, PA assessment and plan.

## 2013-04-29 NOTE — Clinical Social Work Psychosocial (Signed)
Clinical Social Work Department BRIEF PSYCHOSOCIAL ASSESSMENT 04/29/2013  Patient:  [name] [name], [name] [name]     Account Number:  [id number]     Admit date:  04/27/2013  Clinical Social Worker:  Delmer Islam  Date/Time:  04/29/2013 04:27 AM  Referred by:  Physician  Date Referred:  04/29/2013 Referred for  SNF Placement   Other Referral:   Interview type:  Patient Other interview type:   CSW also talked with son-in-law and POA [name] [name]. Ph [phone] (c).    PSYCHOSOCIAL DATA Living Status:  WIFE Admitted from facility:   Level of care:   Primary support name:  [name] [name] Primary support relationship to patient:  FAMILY Degree of support available:   [name] and his wife [name] [name] are POA's and assure patient and his wife are taken care of.    CURRENT CONCERNS Current Concerns  Post-Acute Placement   Other Concerns:    SOCIAL WORK ASSESSMENT / PLAN CSW talked with patient and POA [name] [name], who was at the bedside regarding dishcarge plans and recommendation of ST rehab. Patient and family in agreement and SNF preference is [facility]. When asked for a 2nd choice in case [facility] does not have a bed, neither patient or [name] [name] provided an alternative. CSW advised that patient's wife is in the home and requires 24/7 care that is provided by an aide.   Assessment/plan status:  Psychosocial Support/Ongoing Assessment of Needs Other assessment/ plan:   Information/referral to community resources:   [name] declined a SNF list for [facility], indicating that he is aware of all the facilites in [place].    PATIENT'S/FAMILY'S RESPONSE TO PLAN OF CARE: Both patient and [name] [name] pleasant and was expecting to talk with CSW regarding ST rehab.

## 2013-04-30 ENCOUNTER — Inpatient Hospital Stay
Admission: RE | Admit: 2013-04-30 | Discharge: 2013-05-15 | Disposition: A | Discharge status: Home / Self Care | Payer: PRIVATE HEALTH INSURANCE | Source: Ambulatory Visit | Attending: Internal Medicine | Admitting: Internal Medicine

## 2013-04-30 ENCOUNTER — Other Ambulatory Visit: Payer: Self-pay | Admitting: Family Medicine

## 2013-04-30 DIAGNOSIS — K5900 Constipation, unspecified: Secondary | ICD-10-CM | POA: Diagnosis not present

## 2013-04-30 DIAGNOSIS — I4891 Unspecified atrial fibrillation: Secondary | ICD-10-CM | POA: Diagnosis not present

## 2013-04-30 DIAGNOSIS — M549 Dorsalgia, unspecified: Secondary | ICD-10-CM | POA: Diagnosis not present

## 2013-04-30 DIAGNOSIS — T8130XA Disruption of wound, unspecified, initial encounter: Secondary | ICD-10-CM | POA: Diagnosis not present

## 2013-04-30 DIAGNOSIS — M5137 Other intervertebral disc degeneration, lumbosacral region: Secondary | ICD-10-CM | POA: Diagnosis not present

## 2013-04-30 DIAGNOSIS — IMO0002 Reserved for concepts with insufficient information to code with codable children: Secondary | ICD-10-CM | POA: Diagnosis not present

## 2013-04-30 DIAGNOSIS — E119 Type 2 diabetes mellitus without complications: Secondary | ICD-10-CM | POA: Diagnosis not present

## 2013-04-30 DIAGNOSIS — R05 Cough: Principal | ICD-10-CM

## 2013-04-30 DIAGNOSIS — I10 Essential (primary) hypertension: Secondary | ICD-10-CM | POA: Diagnosis not present

## 2013-04-30 DIAGNOSIS — R279 Unspecified lack of coordination: Secondary | ICD-10-CM | POA: Diagnosis not present

## 2013-04-30 DIAGNOSIS — J449 Chronic obstructive pulmonary disease, unspecified: Secondary | ICD-10-CM | POA: Diagnosis not present

## 2013-04-30 DIAGNOSIS — Z9181 History of falling: Secondary | ICD-10-CM | POA: Diagnosis not present

## 2013-04-30 DIAGNOSIS — R262 Difficulty in walking, not elsewhere classified: Secondary | ICD-10-CM | POA: Diagnosis not present

## 2013-04-30 DIAGNOSIS — M6281 Muscle weakness (generalized): Secondary | ICD-10-CM | POA: Diagnosis not present

## 2013-04-30 DIAGNOSIS — R0989 Other specified symptoms and signs involving the circulatory and respiratory systems: Secondary | ICD-10-CM | POA: Diagnosis not present

## 2013-04-30 DIAGNOSIS — R062 Wheezing: Secondary | ICD-10-CM | POA: Diagnosis not present

## 2013-04-30 DIAGNOSIS — R609 Edema, unspecified: Secondary | ICD-10-CM | POA: Diagnosis not present

## 2013-04-30 LAB — PROTIME-INR: INR: 2.39 — ABNORMAL HIGH (ref 0.00–1.49)

## 2013-04-30 LAB — GLUCOSE, CAPILLARY: Glucose-Capillary: 140 mg/dL — ABNORMAL HIGH (ref 70–99)

## 2013-04-30 MED ORDER — TRAMADOL HCL 50 MG PO TABS: 50.0000 mg | ORAL_TABLET | Freq: Three times a day (TID) | ORAL | Status: DC | PRN

## 2013-04-30 MED ORDER — FLEET ENEMA 7-19 GM/118ML RE ENEM
1.0000 | ENEMA | Freq: Once | RECTAL | Status: AC
  Administered 2013-04-30: 1 via RECTAL
  Filled 2013-04-30: qty 1

## 2013-04-30 MED ORDER — DSS 100 MG PO CAPS: 100.0000 mg | ORAL_CAPSULE | Freq: Two times a day (BID) | ORAL | Status: DC

## 2013-04-30 MED ORDER — SENNA 8.6 MG PO TABS: 1.0000 | ORAL_TABLET | Freq: Two times a day (BID) | ORAL | Status: DC

## 2013-04-30 MED ORDER — POLYETHYLENE GLYCOL 3350 17 G PO PACK
17.0000 g | PACK | Freq: Two times a day (BID) | ORAL | Status: DC
  Administered 2013-04-30: 17 g via ORAL
  Filled 2013-04-30 (×2): qty 1

## 2013-04-30 MED ORDER — HYDROCODONE-ACETAMINOPHEN 10-325 MG PO TABS: 1.0000 | ORAL_TABLET | Freq: Four times a day (QID) | ORAL | Status: DC | PRN

## 2013-04-30 MED ORDER — SORBITOL 70 % PO SOLN: 30.0000 mL | Freq: Every day | ORAL | Status: DC | PRN

## 2013-04-30 MED ORDER — METHOCARBAMOL 500 MG PO TABS: 500.0000 mg | ORAL_TABLET | Freq: Three times a day (TID) | ORAL | Status: DC

## 2013-04-30 MED ORDER — POLYETHYLENE GLYCOL 3350 17 G PO PACK: 17.0000 g | PACK | Freq: Two times a day (BID) | ORAL | Status: DC

## 2013-04-30 MED ORDER — WARFARIN SODIUM 5 MG PO TABS
5.0000 mg | ORAL_TABLET | Freq: Once | ORAL | Status: AC
  Administered 2013-04-30: 5 mg via ORAL
  Filled 2013-04-30: qty 1

## 2013-04-30 MED ORDER — NICOTINE 14 MG/24HR TD PT24: 1.0000 | MEDICATED_PATCH | Freq: Every day | TRANSDERMAL | Status: DC

## 2013-04-30 MED ORDER — MAGNESIUM CITRATE PO SOLN
1.0000 | Freq: Once | ORAL | Status: AC
  Administered 2013-04-30: 1 via ORAL
  Filled 2013-04-30: qty 296

## 2013-04-30 NOTE — Progress Notes (Signed)
[facility] discharged Skilled nursing facility per MD order.  Report called to receiving Nicholaus Bloom, LPN at [facility].     Medication List    TAKE these medications       albuterol 108 (90 BASE) MCG/ACT inhaler  Commonly known as:  PROVENTIL HFA;VENTOLIN HFA  Inhale 2 puffs into the lungs as needed.     citalopram 10 MG tablet  Commonly known as:  CELEXA  TAKE 1 TABLET BY MOUTH EVERY DAY FOR MOOD     diazepam 5 MG tablet  Commonly known as:  VALIUM  Take 5 mg by mouth at bedtime as needed for anxiety or sleep.     DSS 100 MG Caps  Take 100 mg by mouth 2 (two) times daily.     fluticasone 50 MCG/ACT nasal spray  Commonly known as:  FLONASE  Place 1 spray into the nose daily as needed. Allergies     furosemide 20 MG tablet  Commonly known as:  LASIX  Take 20 mg by mouth 2 (two) times daily with breakfast and lunch.     glyBURIDE micronized 3 MG tablet  Commonly known as:  GLYNASE  Take 3 mg by mouth daily with breakfast.     HYDROcodone-acetaminophen 10-325 MG per tablet  Commonly known as:  NORCO  Take 1 tablet by mouth every 6 (six) hours as needed.     methocarbamol 500 MG tablet  Commonly known as:  ROBAXIN  Take 1 tablet (500 mg total) by mouth 3 (three) times daily.     metoprolol succinate 25 MG 24 hr tablet  Commonly known as:  TOPROL-XL  Take 25 mg by mouth every morning.     nicotine 14 mg/24hr patch  Commonly known as:  NICODERM CQ - dosed in mg/24 hours  Place 1 patch onto the skin daily.     polyethylene glycol packet  Commonly known as:  MIRALAX / GLYCOLAX  Take 17 g by mouth 2 (two) times daily.     potassium chloride 10 MEQ tablet  Commonly known as:  K-DUR  Take 20 mEq by mouth daily.     senna 8.6 MG Tabs  Commonly known as:  SENOKOT  Take 1 tablet (8.6 mg total) by mouth 2 (two) times daily.     sorbitol 70 % solution  Take 30 mLs by mouth daily as needed. For constipation.     traMADol 50 MG tablet  Commonly known as:  ULTRAM   Take 1 tablet (50 mg total) by mouth every 8 (eight) hours as needed.     warfarin 5 MG tablet  Commonly known as:  COUMADIN  Take 5 mg by mouth every evening.        Patients skin is clean, dry and intact, no evidence of skin break down., sacrum slightly reddened. IV site discontinued and catheter remains intact. Site without signs and symptoms of complications. Dressing and pressure applied.  Patient transported on a stretcher by non emergent EMS,  no distress noted upon discharge.  Laural Benes, Sadeen Wiegel C 04/30/2013 7:41 PM

## 2013-04-30 NOTE — Progress Notes (Signed)
Utilization Review Complete  

## 2013-04-30 NOTE — Progress Notes (Signed)
Physical Therapy Treatment Patient Details Name: [name] [name] MRN: [id number] DOB: 27-Dec-1931 Today's Date: 04/30/2013 Time: 4098-1191 PT Time Calculation (min): 33 min  PT Assessment / Plan / Recommendation Comments on Treatment Session  Progressing slowly with mobility. Still having difficulty with transitional movement. Plan is for d/c to SNF later today.     Follow Up Recommendations  SNF     Does the patient have the potential to tolerate intense rehabilitation     Barriers to Discharge        Equipment Recommendations  Rolling walker with 5" wheels    Recommendations for Other Services    Frequency Min 3X/week   Plan Discharge plan remains appropriate    Precautions / Restrictions Precautions Precautions: Fall Restrictions Weight Bearing Restrictions: No   Pertinent Vitals/Pain 3/10 at rest; 5/10 with activity-back    Mobility  Bed Mobility Bed Mobility: Rolling Left;Rolling Right;Right Sidelying to Sit Rolling Right: 4: Min assist Rolling Left: 4: Min assist Right Sidelying to Sit: 3: Mod assist Details for Bed Mobility Assistance: Assist for bil LEs off/onto bed and trunk to upright/sidelying. Increased time. VCs safety, technique, hand placement.  Transfers Transfers: Sit to Stand;Stand to Sit Sit to Stand: 1: +2 Total assist;From bed;From chair/3-in-1 Sit to Stand: Patient Percentage: 60% Stand to Sit: 4: Min assist;To chair/3-in-1;To bed Details for Transfer Assistance: +2 to rise from low surface. Mod assist to rise from elevated bed. VCs safety, technique, hand placement.  Ambulation/Gait Ambulation Distance (Feet): 115 Feet (115x1, 50' x1) Assistive device: Rolling walker Ambulation/Gait Assistance Details: VCS safety, posture, distance from walker. slow gait speed. 1 seated rest break.  Gait Pattern: Trunk flexed;Step-through pattern;Decreased stride length;Decreased step length - right;Decreased step length - left    Exercises     PT Diagnosis:     PT Problem List:   PT Treatment Interventions:     PT Goals Acute Rehab PT Goals Pt will Roll Supine to Left Side: with supervision PT Goal: Rolling Supine to Left Side - Progress: Progressing toward goal Pt will go Supine/Side to Sit: with supervision PT Goal: Supine/Side to Sit - Progress: Progressing toward goal Pt will go Sit to Stand: with supervision PT Goal: Sit to Stand - Progress: Progressing toward goal Pt will go Stand to Sit: with supervision PT Goal: Stand to Sit - Progress: Progressing toward goal Pt will Ambulate: 51 - 150 feet;with supervision;with least restrictive assistive device PT Goal: Ambulate - Progress: Progressing toward goal PT Goal: Up/Down Stairs - Progress: Discontinued (comment) (defer to SNF)  Visit Information  Last PT Received On: 04/30/13 Assistance Needed: +2    Subjective Data  Subjective: That pain is gonna creep up once I get moving Patient Stated Goal: Return home   Cognition  Cognition Arousal/Alertness: Awake/alert Behavior During Therapy: WFL for tasks assessed/performed Overall Cognitive Status: Within Functional Limits for tasks assessed    Balance     End of Session PT - End of Session Equipment Utilized During Treatment: Gait belt Activity Tolerance: Patient tolerated treatment well Patient left: in bed;with call bell/phone within reach;with nursing in room   GP     Rebeca Alert, MPT Pager: [phone]

## 2013-04-30 NOTE — Discharge Summary (Signed)
Physician Discharge Summary  [name] [name] ZOX:[id number] DOB: 12-Oct-1932 DOA: 04/27/2013  PCP: Lilyan Punt, MD  Admit date: 04/27/2013 Discharge date: 04/30/2013  Time spent: 60 minutes  Recommendations for Outpatient Follow-up:  1. Patient is to followup with M.D. at the skilled nursing facility. Patient's constipation will need to be followed up upon his been placed on a bowel regimen.  Discharge Diagnoses:  Principal Problem:   Back pain Active Problems:   Hypertension   Atrial fibrillation   Diabetes mellitus   Long term (current) use of anticoagulants   Leg edema, left   Unspecified constipation   Discharge Condition: Stable  Diet recommendation: Heart healthy  Filed Weights   04/27/13 1352 04/27/13 2139  Weight: 115.667 kg (255 lb) 115.3 kg (254 lb 3.1 oz)    History of present illness:  [name] [name] is a 79 y.o. male  has a past medical history of Hypertension; Cerebrovascular disease (07/2010); Hyperlipidemia; History of noncompliance with medical treatment; Aortic stenosis (2007); Hepatic steatosis; Diabetes mellitus; Atrial fibrillation; Exertional dyspnea; Degenerative joint disease; Dizziness; Gastroesophageal reflux disease; Renal insufficiency; Asthma; Irregular heartbeat; and Peripheral vascular disease.  Presented with  Riding on a tractor and had an accident when his tractor dropped down about 18 inches. Since then he have had severe back pain. He is able to lay down but has pain with sitting up and ambulation. He have not had any neurological problems since his injury. Able to move his legs well and no incontinence. CT scan in ED showed some shallow disk bulging at L5-S1. The pain is perivertebral worse on the right shooting down to the buttock. Otherwise he is at baseline of his health. Denies any fevers or chills, no chest pain, no shortness of breath. His pain was not controlled in ED and hospitalist was calleld for an admission.  2 weeks ago he had a lown  mover fall on his Left leg and have had some skin laceration that is now healing but his leg has been swelling   Hospital Course:  #1 low back pain Patient had presented after an accident on his tractor when he fell about 18 inches and had been complaining of severe back pain and inability to lay down pain onset and ambulation. Patient was noted not to have any neurological symptoms since his injury. Patient denied any urinary incontinence. Plan domes of the L-spine which were done showed no acute findings however this showed mild to moderate lower lumbar degenerative disc disease. CT of the L-spine was negative for any fracture or acute abnormality. Also show degenerative disc disease appear most notably at L4-5 where there was mild to moderate central canal and left foraminal narrowing. Patient was seen by physical therapy during the hospitalization were recommended skilled nursing facility. Patient was placed on narcotic pain medication as well as a muscle relaxant and NSAIDs. Patient was placed on pain management with some improvement in his low back pain. Patient be discharged to a skilled nursing facility in stable condition.  #2 constipation During the hospitalization patient did complain of constipation x5 days. Patient was given some MiraLAX orally with no bowel movement. Patient was also given some magnesium citrate with no bowel movement. Patient was subsequently given a fleets enema and soap suds enema. Patient will be discharged on a bowel mat regimen of MiraLAX twice daily as well as Senokot. Patient will also be discharged on sorbitol as needed. If patient continues to be constipated may consider another enema on manual disimpaction.  #3 left  lower extremity pain Felt to be secondary to recent fall. Lower extremity Dopplers were done which were negative for DVT. X-ray of the ankle was done which was negative for any acute fracture.  The rest of patient's chronic medical issues remained  stable throughout the hospitalization the patient was discharged in stable and improved condition.  Procedures:  Lower extremity venous duplex left leg 04/28/2013  CT of the L-spine 04/27/2013  Plain films of the L-spine 04/27/2013  Plain films of the left ankle 04/28/2039  Consultations:  None  Discharge Exam: Filed Vitals:   04/29/13 2159 04/30/13 0616 04/30/13 1054 04/30/13 1315  BP: 120/74 134/70 144/78 113/51  Pulse: 86 87 86 77  Temp: 98.1 F (36.7 C) 98.3 F (36.8 C)  98.4 F (36.9 C)  TempSrc: Oral Oral  Oral  Resp: 20 20  20   Height:      Weight:      SpO2: 94% 95%  87%    General: NAD Cardiovascular: RRR Respiratory: CTAB  Discharge Instructions      Discharge Orders   Future Appointments Provider Department Dept Phone   05/06/2013 1:10 PM Babs Sciara, MD [facility] [phone]   05/15/2013 10:30 AM [facility] [phone]   05/15/2013 11:45 AM Chuck Hint, MD [facility] [phone]   Future Orders Complete By Expires     Diet - low sodium heart healthy  As directed     Discharge instructions  As directed     Comments:      Follow up with MD at SNF.    Increase activity slowly  As directed         Medication List    TAKE these medications       albuterol 108 (90 BASE) MCG/ACT inhaler  Commonly known as:  PROVENTIL HFA;VENTOLIN HFA  Inhale 2 puffs into the lungs as needed.     citalopram 10 MG tablet  Commonly known as:  CELEXA  TAKE 1 TABLET BY MOUTH EVERY DAY FOR MOOD     diazepam 5 MG tablet  Commonly known as:  VALIUM  Take 5 mg by mouth at bedtime as needed for anxiety or sleep.     DSS 100 MG Caps  Take 100 mg by mouth 2 (two) times daily.     fluticasone 50 MCG/ACT nasal spray  Commonly known as:  FLONASE  Place 1 spray into the nose daily as needed. Allergies     furosemide 20 MG tablet  Commonly known as:  LASIX  Take 20  mg by mouth 2 (two) times daily with breakfast and lunch.     glyBURIDE micronized 3 MG tablet  Commonly known as:  GLYNASE  Take 3 mg by mouth daily with breakfast.     HYDROcodone-acetaminophen 10-325 MG per tablet  Commonly known as:  NORCO  Take 1 tablet by mouth every 6 (six) hours as needed.     methocarbamol 500 MG tablet  Commonly known as:  ROBAXIN  Take 1 tablet (500 mg total) by mouth 3 (three) times daily.     metoprolol succinate 25 MG 24 hr tablet  Commonly known as:  TOPROL-XL  Take 25 mg by mouth every morning.     nicotine 14 mg/24hr patch  Commonly known as:  NICODERM CQ - dosed in mg/24 hours  Place 1 patch onto the skin daily.     polyethylene glycol packet  Commonly known as:  MIRALAX /  GLYCOLAX  Take 17 g by mouth 2 (two) times daily.     potassium chloride 10 MEQ tablet  Commonly known as:  K-DUR  Take 20 mEq by mouth daily.     senna 8.6 MG Tabs  Commonly known as:  SENOKOT  Take 1 tablet (8.6 mg total) by mouth 2 (two) times daily.     sorbitol 70 % solution  Take 30 mLs by mouth daily as needed. For constipation.     traMADol 50 MG tablet  Commonly known as:  ULTRAM  Take 1 tablet (50 mg total) by mouth every 8 (eight) hours as needed.     warfarin 5 MG tablet  Commonly known as:  COUMADIN  Take 5 mg by mouth every evening.       Allergies  Allergen Reactions  . Ambien (Zolpidem Tartrate) Other (See Comments)    Sleep walks  . Cholestatin   . Lipitor (Atorvastatin Calcium) Other (See Comments)    myalgias  . Ranitidine Other (See Comments)    Chest discomfort  . Simvastatin Other (See Comments)    Myalgias  . Xanax Xr (Alprazolam Er)     Tightness in chest   Follow-up Information   Please follow up. (F/U WITH md AT SNF)        The results of significant diagnostics from this hospitalization (including imaging, microbiology, ancillary and laboratory) are listed below for reference.    Significant Diagnostic Studies: Dg  Lumbar Spine Complete  04/27/2013   *RADIOLOGY REPORT*  Clinical Data: Fall from tractor.  Low back pain.  LUMBAR SPINE - COMPLETE 4+ VIEW  Comparison: None.  Findings: No evidence of acute fracture, spondylolysis, or spondylolisthesis.  Mild to moderate degenerative disc disease with vacuum disc phenomenon seen at levels of L3-4, L4-5, and L5-S1.  No evidence of facet arthropathy other significant bone abnormality. Atherosclerotic calcification of abdominal aorta seen, without definite evidence of aneurysm.  IMPRESSION:  1.  No acute findings. 2.  Mild to moderate lower lumbar degenerative disc disease.   Original Report Authenticated By: Myles Rosenthal, M.D.   Dg Ankle Complete Left  04/27/2013   *RADIOLOGY REPORT*  Clinical Data: Back pain, leg pain  LEFT ANKLE COMPLETE - 3+ VIEW  Comparison: None.  Findings: Three views of the left ankle submitted.  No acute fracture or subluxation.  Ankle mortise is preserved.  Plantar spur of the calcaneus.  IMPRESSION: No acute fracture or subluxation.  Small plantar spur of the calcaneus.   Original Report Authenticated By: Natasha Mead, M.D.   Ct Lumbar Spine Wo Contrast  04/27/2013   *RADIOLOGY REPORT*  Clinical Data: Back pain after twisting injury.  CT LUMBAR SPINE WITHOUT CONTRAST  Technique:  Multidetector CT imaging of the lumbar spine was performed without intravenous contrast administration. Multiplanar CT image reconstructions were also generated.  Comparison: Plain films lumbar spine earlier this same day.  CT abdomen and pelvis 06/24/2010.  Findings: There is no fracture or subluxation of the lumbar spine with vertebral body height and alignment maintained throughout. Small Schmorl's node in the superior endplate of L4 is new since the prior study.  Imaged paraspinous structures demonstrate scattered atherosclerotic vascular disease.  T11-12:  Negative.  T12-L1:  Negative.  L1-2:  There is some facet degenerative change.  No disc bulge or protrusion.  Central  canal and foramina are open.  L2-3:  Facet arthropathy is identified.  Minimal disc bulge without central canal or foraminal narrowing.  L3-4:  There is facet arthropathy  and a shallow disc bulge.  Vacuum disc phenomenon is noted.  The central canal and foramina appear open.  L4-5:  There is facet degenerative disease.  Shallow disc bulge eccentric to the left and ligamentum flavum thickening are identified.  The central canal is mildly to moderately narrowed. There is also mild to moderate left foraminal narrowing.  The right foramen is open.  L5-S1:  Shallow disc bulge.  The central canal and foramina are patent.  IMPRESSION:  1.  Negative for fracture or other acute abnormality.  2.  Degenerative disc disease appearing most notable at L4-5 where there is mild to moderate central canal and left foraminal narrowing.   Original Report Authenticated By: Holley Dexter, M.D.    Microbiology: No results found for this or any previous visit (from the past 240 hour(s)).   Labs: Basic Metabolic Panel:  Recent Labs Lab 04/27/13 1442 04/28/13 0610  NA 139 135  K 4.5 4.1  CL 103 98  CO2 29 28  GLUCOSE 118* 147*  BUN 14 13  CREATININE 1.02 1.00  CALCIUM 9.5 9.0  MG  --  1.9  PHOS  --  3.1   Liver Function Tests:  Recent Labs Lab 04/28/13 0610  AST 14  ALT 15  ALKPHOS 97  BILITOT 0.7  PROT 6.9  ALBUMIN 3.4*   No results found for this basename: LIPASE, AMYLASE,  in the last 168 hours No results found for this basename: AMMONIA,  in the last 168 hours CBC:  Recent Labs Lab 04/27/13 1442 04/28/13 0610  WBC 9.2 8.3  NEUTROABS 7.5  --   HGB 15.3 15.0  HCT 44.4 43.7  MCV 88.6 89.2  PLT 230 203   Cardiac Enzymes: No results found for this basename: CKTOTAL, CKMB, CKMBINDEX, TROPONINI,  in the last 168 hours BNP: BNP (last 3 results) No results found for this basename: PROBNP,  in the last 8760 hours CBG:  Recent Labs Lab 04/29/13 1243 04/29/13 1741 04/29/13 2202  04/30/13 0813 04/30/13 1219  GLUCAP 141* 127* 119* 140* 124*       Signed:  Dianelys Scinto  [facility] 04/30/2013, 2:02 PM

## 2013-04-30 NOTE — Progress Notes (Signed)
Occupational Therapy Discharge Patient Details Name: [name] [name] MRN: [id number] DOB: August 24, 1932 Today's Date: 04/30/2013 Time:  -     Chart review completed; pt planning to go to SNF at discharge. Will defer OT evaluation to SNF. Will sign off.       GO     [name] A 04/30/2013, 9:38 AM

## 2013-04-30 NOTE — Progress Notes (Signed)
ANTICOAGULATION CONSULT NOTE - Follow Up Consult  Pharmacy Consult for Coumadin Indication: atrial fibrillation  Patient Measurements: Height: 6' 1.5" (186.7 cm) Weight: 254 lb 3.1 oz (115.3 kg) IBW/kg (Calculated) : 81.05  Vital Signs: Temp: 98.4 F (36.9 C) (06/17 1315) Temp src: Oral (06/17 1315) BP: 113/51 mmHg (06/17 1315) Pulse Rate: 77 (06/17 1315)  Labs:  Recent Labs  04/28/13 0610 04/29/13 0510 04/30/13 0630  HGB 15.0  --   --   HCT 43.7  --   --   PLT 203  --   --   LABPROT 30.3* 30.2* 25.0*  INR 3.10* 3.09* 2.39*  CREATININE 1.00  --   --     Estimated Creatinine Clearance: 81.6 ml/min (by C-G formula based on Cr of 1).  Assessment:   Home Coumadin dose 5 mg daily.   Coumadin 2 mg given on 6/15 and 6/16 when INR >3, now down to 2.39, therapeutic.  Goal of Therapy:  INR 2-3 Monitor platelets by anticoagulation protocol: Yes   Plan:   Coumadin 5 mg today.   Will give today's dose prior to discharge to SNF.  Dennie Fetters, RPh Pager: [phone] 04/30/2013,2:59 PM

## 2013-05-01 ENCOUNTER — Other Ambulatory Visit: Payer: Medicare Other

## 2013-05-01 ENCOUNTER — Ambulatory Visit: Payer: Medicare Other | Admitting: Neurosurgery

## 2013-05-01 ENCOUNTER — Other Ambulatory Visit: Payer: Self-pay | Admitting: Geriatric Medicine

## 2013-05-01 ENCOUNTER — Non-Acute Institutional Stay (SKILLED_NURSING_FACILITY): Payer: Medicare Other | Admitting: Internal Medicine

## 2013-05-01 DIAGNOSIS — R609 Edema, unspecified: Secondary | ICD-10-CM

## 2013-05-01 DIAGNOSIS — M549 Dorsalgia, unspecified: Secondary | ICD-10-CM | POA: Diagnosis not present

## 2013-05-01 DIAGNOSIS — R062 Wheezing: Secondary | ICD-10-CM | POA: Diagnosis not present

## 2013-05-01 DIAGNOSIS — Z7901 Long term (current) use of anticoagulants: Secondary | ICD-10-CM

## 2013-05-01 DIAGNOSIS — K5900 Constipation, unspecified: Secondary | ICD-10-CM

## 2013-05-01 DIAGNOSIS — I4891 Unspecified atrial fibrillation: Secondary | ICD-10-CM

## 2013-05-01 DIAGNOSIS — E119 Type 2 diabetes mellitus without complications: Secondary | ICD-10-CM

## 2013-05-01 DIAGNOSIS — R0989 Other specified symptoms and signs involving the circulatory and respiratory systems: Secondary | ICD-10-CM | POA: Diagnosis not present

## 2013-05-01 DIAGNOSIS — I10 Essential (primary) hypertension: Secondary | ICD-10-CM

## 2013-05-01 DIAGNOSIS — R600 Localized edema: Secondary | ICD-10-CM

## 2013-05-01 LAB — GLUCOSE, CAPILLARY
Comment 2: 219661
Glucose-Capillary: 137 mg/dL — ABNORMAL HIGH (ref 70–99)
Glucose-Capillary: 149 mg/dL — ABNORMAL HIGH (ref 70–99)

## 2013-05-01 MED ORDER — DIAZEPAM 5 MG PO TABS: 5.0000 mg | ORAL_TABLET | Freq: Every evening | ORAL | Status: DC | PRN

## 2013-05-01 MED ORDER — HYDROCODONE-ACETAMINOPHEN 10-325 MG PO TABS: 1.0000 | ORAL_TABLET | Freq: Four times a day (QID) | ORAL | Status: DC | PRN

## 2013-05-02 ENCOUNTER — Ambulatory Visit (HOSPITAL_COMMUNITY): Payer: Medicare Other | Attending: Internal Medicine

## 2013-05-02 ENCOUNTER — Non-Acute Institutional Stay (SKILLED_NURSING_FACILITY): Payer: Medicare Other | Admitting: Internal Medicine

## 2013-05-02 DIAGNOSIS — R062 Wheezing: Secondary | ICD-10-CM | POA: Diagnosis not present

## 2013-05-02 DIAGNOSIS — R05 Cough: Secondary | ICD-10-CM | POA: Insufficient documentation

## 2013-05-02 DIAGNOSIS — J449 Chronic obstructive pulmonary disease, unspecified: Secondary | ICD-10-CM | POA: Diagnosis not present

## 2013-05-02 DIAGNOSIS — R0989 Other specified symptoms and signs involving the circulatory and respiratory systems: Secondary | ICD-10-CM

## 2013-05-02 DIAGNOSIS — R059 Cough, unspecified: Secondary | ICD-10-CM | POA: Insufficient documentation

## 2013-05-02 LAB — GLUCOSE, CAPILLARY
Glucose-Capillary: 146 mg/dL — ABNORMAL HIGH (ref 70–99)
Glucose-Capillary: 123 mg/dL — ABNORMAL HIGH (ref 70–99)
Glucose-Capillary: 141 mg/dL — ABNORMAL HIGH (ref 70–99)

## 2013-05-02 NOTE — Progress Notes (Signed)
Patient ID: [name] [name], male   DOB: April 26, 1932, 79 y.o.   MRN: [id number] This is an acute visit.  Level of care skilled.  Facility [facility].  Chief complaint-acute visit secondary to chest congestion-cough-.  History of present illness.  Patient is a pleasant elderly resident who was recently admitted for rehabilitation after sustaining a fall on his tractor.  There was no acute abnormalities found on x-ray except for degenerative changes.  Physical therapy recommended short-term rehabilitation which is why he is here.  Apparently last night he states he developed a cough this appears to progress today he is having some chest congestion denies any acute shortness of breath.--This is additionally he has noticed increased shortness of breath today.  His also had a cough productive of green phlegm   nursing staff is concerned saying he just doesn't look like he feels well.  He does have a listed history of asthma and and is on albuterol inhaler when necessary He is afebrile his vital signs actually are stable as well as O2 saturation which is in the high 90s on room air.  Family medical social history has been reviewed per discharge summary on 04/30/2013.  Medications have been reviewed.  Review of systems.  General he denies any fever chills says he just doesn't feel real well.  Skin-denies issues.  Head ears eyes nose mouth and throat-does not complaining of a headache or sore throat or visual changes.  Respiratory-complains at times of feeling short of breath since last night-does have chest congestion.  Cardiac-does not complaining of chest pain.  GI-does not complaining of any abdominal pain nausea vomiting.  Muscle skeletal does have some back pain apparently the pain medication is helping.  Physical exam.  Temperature is 98.0 pulse 69 respirations 20 blood pressure 122/69 O2 saturation is 98% on room air  In general this is a somewhat obese elderly male in no  distress but did appear initially uncomfortable lying in bed however when he was placed in his wheelchair he appeared to be more comfortable no signs of respiratory distress  Skin initially somewhat flushed but then quickly improved.  Oropharynx is clear mucous membranes moist.  Neck no adenopathy.  Chest he does have diffuse scattered rales and expiratory wheezing there is no labored breathing  Heart-distant heart sounds-largely regular rate and rhythm.  He has baseline lower extremity edema this is pretty mild and not changed from yesterday.  Abdomen obese soft nontender with positive bowel sounds.  Labs.  12/29/2012.  WBC 8.3 hemoglobin 15.0 platelets 203 3  Liver function tests within normal limits except albumin of 3.2.  Sodium 135 potassium 4.1 BUN 13 creatinine 1.00.  Assessment and plan.  #1-cough with chest congestion.--This point he does not appear to be unstable although certainly does not appear to feel as well as he did yesterday-Will obtain a chest x-ray-also will start duo nebs every 6 hours routine for 48 hours and then when necessary-also add Mucinex 600 mg twice a day for 7 days Monitor with vital signs pulse ox every 4 hours x2 and then Q. shift-also start prednisone taper 40 mg for 2 days 30 mg a day for 2 days 20 mg a day for 2 days 10 mg a day for 2 days and then discontinue.  Also will obtain CBC with differential and basic metabolic panel a.m. tomorrow.  Addendum.  I did reassess him later in the evening and he appeared to be feeling better he is lying in bed comfortably chest exam  is improved he has a some f expiratory wheezing status post nebulizer this appears better.  We also have obtained chest x-ray results which showed COPD changes but no infiltrate or acute process.  will need to monitor he appears he might be somewhat more vulnerable respiratory wise  [id number]-of note greater than 30 minutes spent assessing patient and formulating a plan of  care as well as reassessing patient  .

## 2013-05-03 ENCOUNTER — Non-Acute Institutional Stay (SKILLED_NURSING_FACILITY): Payer: Medicare Other | Admitting: Internal Medicine

## 2013-05-03 DIAGNOSIS — R062 Wheezing: Secondary | ICD-10-CM | POA: Diagnosis not present

## 2013-05-03 DIAGNOSIS — R05 Cough: Secondary | ICD-10-CM | POA: Diagnosis not present

## 2013-05-03 DIAGNOSIS — R0989 Other specified symptoms and signs involving the circulatory and respiratory systems: Secondary | ICD-10-CM | POA: Diagnosis not present

## 2013-05-03 LAB — GLUCOSE, CAPILLARY
Glucose-Capillary: 159 mg/dL — ABNORMAL HIGH (ref 70–99)
Glucose-Capillary: 145 mg/dL — ABNORMAL HIGH (ref 70–99)

## 2013-05-03 NOTE — Progress Notes (Signed)
Patient ID: [name] [name], male   DOB: August 27, 1932, 79 y.o.   MRN: [id number]  This is an acute visit.  Level of care skilled.  Facility [facility]  Chief complaint-acute visit status post hospitalization for back pain after tractor accident.  History of present illness.  Patient is a pleasant 79 year old male with a history of hypertension CVA aortic stenosis and diabetes as well as atrial fibrillation.  Apparently he was riding on a tractor and had an accident when his tractor dropped about 18 inches.  He experienced severe back pain.  He did not have any neurologic symptoms.  CT in the emergency department showed disc bulging at L5-S1 area.  He was seen by physical therapy and recommendation was made for physical therapy.  He was started on Vicodin-when necessary tramadol l as well as a muscle relaxant and apparently also received an NSAID.  Apparently this helped some but he has been discharged to skilled nursing for further conditioning.  He did have lower extremity Dopplers that were negative for any DVT-x-ray of the ankle was negative for any acute fracture and I believe this was on the left.  His other conditions apparently were stable during hospitalization.  He does have a history of atrial fibrillation is on Coumadin INR is therapeutic today-he continues on Toprol for rate control  Previous medical history.  History of low back pain.  Hypertension.  Atrial fibrillation.  Diabetes type 2.  History of left leg edema.  History of constipation.  History of CVA-TIA.  Aortic stenosis considered to be very mild.  Hepatic steatosis  Degenerative joint disease of the feet and legs.  Dizziness.  GERD.  Renal insufficiency.  Asthma.  Peripheral vascular disease.    Previous surgical history.  Right rotator cuff repair.  Lipoma excision in 1980.  Urethral stricture dilation 1980s.  Open reduction internal fixation right ankle fracture  2000.  Previous history of TURP November 2012  Hospital studies.  04/27/2013.  Lumbar x-ray-no acute findings mild to moderate lower lumbar degenerative disc disease.  04/27/2013-CT lumbar spine.  Negative for fracture or other acute abnormality did show degenerative disc disease most notably at L4-5  Social history.  Patient lives by himself at home-apparently quit smoking about 20 years ago he was included cigarettes with a 20-pack-year history.  Use smokeless tobacco often involved 16 months ago this was chewing tobacco apparently.  Denies alcohol or illicit drug use.  Family history.  Father with diabetes   Mother with history of CVA.   Medications.  Senna 8.6 mg twice a day.  Coumadin 5 mg daily.  Celexa 10 mg daily.  Lasix 20 mg twice a day.  Glyburide 3 mg daily.  Robaxin 500 mg 3 times a day.  Toprol-XL 25 mg every morning.  Nicotine patch.  MiraLax 17 g twice a day.  Potassium 10 mEq 2 tabs daily.  Albuterol inhaler 2 puffs 3 times a day when necessary diazepam 5 mg when necessary each bedtime.  Flonase nasal spray daily when necessary Vicodin 10-325 mg 1 tab every 6 hours when necessary.  Tramadol 50 mg every 8 hours when necessary pain.  Sorbitol 30 mL daily when necessary  Review of systems.  In general denies any fever or chills.  Skin-does have an abrasion on his left inner leg this was apparently from a lawnmower accident previously he says this is looking better.  Head ears eyes nose mouth and throat-denies visual changes or difficulty swallowing.  Respiratory-does not complaining of shortness of  breath or cough apparently has some history of asthma on when necessary albuterol inhaler.  Cardiac-denies any chest pain or palpitations.  GI-history of constipation and does not complaining of that today denies abdominal pain nausea or vomiting or diarrhea.  GU-no complaints of dysuria does have a history of TURP.  Muscle  skeletal-does complain of low back pain with much movement apparently this is getting a bit better he says the pain medication does help.  Neurologic-he denies any numbness tingling headache or dizziness.  Psych as a history of depression-he is on Celexa.  Physical exam.  Temperature is 98.7 pulse 90 respirations 20 blood pressure 116/76.  Gen. this is a pleasant somewhat obese elderly male in no distress sitting comfortably in his wheelchair however he does have some pain with extensive movement.  Skin is warm and dry he does have an abrasion left lower leg this appears to be scabbed and an old wound it does not appear to be infected no drainage or significant erythema.  Eyes pupils appear equal round reactive to light sclera and conjunctiva are clear.  Oropharynx is clear mucous membranes moist.  Chest is clear to auscultation with somewhat reduced breath sounds no labored breathing.  Heart is distant heart sounds is irregular irregular rate and rhythm he has some mild lower extremity edema more so on the left.  Abdomen obese soft nontender with positive bowel sounds muscle skeletal is able to move all extremities x4 has some lower extremity weakness-I cannot appreciate any deformity of the back however there is tenderness to palpation of the left lower back area. [name]  Upper extremity strength appears to be intact grip strength intact.  Neurologic-grossly intact no focal deficits cranial nerves intact speech is clear.  Psych he is alert and oriented x3 pleasant and appropriate   Labs.    05/01/2012.  INR-2.59.  04/30/2013.  INR 2.39.  04/28/2013.  TSH-2.73.  Liver function tests within normal limits except albumin of 3.1.  04/28/2013.   sodium 135 potassium 4.1 BUN 13 creatinine 1.0.  WBC 8.3 hemoglobin 15.0 platelets 203  Assessment and plan.  #1-history of low back pain-. Will continue to monitor her he is having some discomfort today but he says the  Vicodin helps he also has Ultram when necessary as well as a muscle relaxer Robaxin 3 times a day routine-  #2-A. fib-this appears to be rate controlled he is on Toprol r-he is on Coumadin for anticoagulation continue to monitor again this does appear to be rate controlled   #3-history of edema-this appears improved he is on Lasix date BMP [facility] the next week to assess renal function and electrolytes.  #3-anticoagulation-again with history of A. fib on Coumadin we'll update INR in 2 days it is therapeutic.  [name] #4 -- history of diabetes-he continues on glyburide-monitor CBGs a.c. and each bedtime.  #5-constipation --apparently this was an issue in the hospital he is on senna twice a day MiraLax twice a day as well as sorbitol when necessary-  \#6-history of depression --he is on Celexa continue to monitor--  #7 past history of asthma? he is on when necessary albuterol nebulizers continue to monitor this appears to be stable    .  #8-history of anxiety is on diazepam when necessary does not appear to be anxious this afternoon  #9-history of TIAs-again he is on anticoagulation  #10-hypertension-this appears stable he is on Toprol   of note we'll update CBC in addition to BMP for updated values next week  .  [id number]-of note 50 minutes spent assessing patient-review of hospital records as well as formulating a plan of care for numerous diagnoses-.  #2

## 2013-05-04 LAB — GLUCOSE, CAPILLARY
Glucose-Capillary: 340 mg/dL — ABNORMAL HIGH (ref 70–99)
Glucose-Capillary: 236 mg/dL — ABNORMAL HIGH (ref 70–99)

## 2013-05-05 LAB — GLUCOSE, CAPILLARY: Glucose-Capillary: 119 mg/dL — ABNORMAL HIGH (ref 70–99)

## 2013-05-06 ENCOUNTER — Ambulatory Visit: Payer: Medicare Other | Admitting: Family Medicine

## 2013-05-06 LAB — GLUCOSE, CAPILLARY
Glucose-Capillary: 144 mg/dL — ABNORMAL HIGH (ref 70–99)
Glucose-Capillary: 141 mg/dL — ABNORMAL HIGH (ref 70–99)
Glucose-Capillary: 191 mg/dL — ABNORMAL HIGH (ref 70–99)

## 2013-05-07 ENCOUNTER — Non-Acute Institutional Stay (SKILLED_NURSING_FACILITY): Payer: Medicare Other | Admitting: Internal Medicine

## 2013-05-07 DIAGNOSIS — R0989 Other specified symptoms and signs involving the circulatory and respiratory systems: Secondary | ICD-10-CM

## 2013-05-07 DIAGNOSIS — R609 Edema, unspecified: Secondary | ICD-10-CM | POA: Diagnosis not present

## 2013-05-07 DIAGNOSIS — I4891 Unspecified atrial fibrillation: Secondary | ICD-10-CM | POA: Diagnosis not present

## 2013-05-07 DIAGNOSIS — R062 Wheezing: Secondary | ICD-10-CM

## 2013-05-07 DIAGNOSIS — M549 Dorsalgia, unspecified: Secondary | ICD-10-CM | POA: Diagnosis not present

## 2013-05-07 DIAGNOSIS — E119 Type 2 diabetes mellitus without complications: Secondary | ICD-10-CM | POA: Diagnosis not present

## 2013-05-07 LAB — GLUCOSE, CAPILLARY: Glucose-Capillary: 152 mg/dL — ABNORMAL HIGH (ref 70–99)

## 2013-05-07 NOTE — Progress Notes (Signed)
Patient ID: [name] [name], male   DOB: 1932-08-14, 79 y.o.   MRN: [id number] is on glyburide.       This is an acute visit.  Level care skilled.  Facility [facility].  Chief complaint-acute visit secondary to anticoagulation management-respiratory issues followup.  History of present illness.  Patient is a very pleasant elderly resident for rehabilitation after a back injury after a tractor accident.  -He appears to be doing somewhat better here he is receiving Vicodin when necessary he is a bit more mobile.  He does have a history of atrial fibrillation is on chronic Coumadin-INR did become supratherapeutic over the weekend at 3.3 for his Coumadin was held for one day and has been restarted at 4.5 mg a day-INR yesterday was down to 2.67  He also has received a short course of Avelox for suspected URI he continues on prednisone-this has improved significantly he was no longer wheezing or short of breath and has been stable throughout the weekend-I suspect possibly Avelox contributed to the high INR again this has been discontinued.  His blood sugars appear to be stable although occasionally later in the day were rising to the 200s I see him in 300s to I suspect this might be due to the prednisone he does have a history of diabetes  Family medical social history is reviewed per discharge note on 04/30/2013.  Medications have been reviewed.  Review of systems.  General denies fever or chills.  Respiratory says he feels much better than last week denies any shortness of breath cough or wheezing.  Cardiac-no complaint of chest pain.  GI no complaints of nausea vomiting diarrhea constipation or abdominal pain.  Muscle skeletal-continues to complain at times of back pain but says this is improved.  Temperature 97.1 pulse 66 respirations 22 blood pressure 129/61  . In general this is a pleasant elderly male in no distress sitting comfortably in his wheelchair.  The skin is warm and  dry.   medial left leg he continued to have an abrasion this appears relatively unchanged there is minimal surrounding erythema this does not appear to be progressing.  Heart is regular irregular rate and rhythm without murmur gallop or rub.  Chest is clear to auscultation without rhonchi rales or wheezes slightly reduced air entry.  Muscle skeletal does ambulate in wheelchair has some mild left leg edema this is unchanged appears to be at baseline he is able to move a bit better than he did last week  .  Labs.  05/06/2013.  WBC 8.9 hemoglobin 13.8 platelets 279.  INR-2.67.  Sodium 140 potassium 4.4 BUN 20 creatinine 0.93.  Assessment and plan.  #1-history of A. fib on anticoagulation-INR is therapeutic I do not see increased bruising or bleeding update INR is due for tomorrow.--He is currently on 4.5 mg a day.-- rate controlled he is on Toprol  #2-respiratory issues --r wheezing and chest congestion-this appears l- improved and stable -- is completing a prednisone taper.  #3-diabetes-blood sugars appear to be satisfactory by in 100's   earlier in the day somewhat higher at times later in the day at this point we will continue to monitor 4 times a day but suspect  when the prednisone is stopped we can reduce his CBGs  #4-edema-this appears to be at baseline-he is on Lasix with potassium metabolic panel was unremarkable  [id number]

## 2013-05-08 LAB — GLUCOSE, CAPILLARY
Glucose-Capillary: 122 mg/dL — ABNORMAL HIGH (ref 70–99)
Glucose-Capillary: 160 mg/dL — ABNORMAL HIGH (ref 70–99)
Glucose-Capillary: 222 mg/dL — ABNORMAL HIGH (ref 70–99)

## 2013-05-09 ENCOUNTER — Non-Acute Institutional Stay (SKILLED_NURSING_FACILITY): Payer: Medicare Other | Admitting: Internal Medicine

## 2013-05-09 DIAGNOSIS — T148XXA Other injury of unspecified body region, initial encounter: Secondary | ICD-10-CM

## 2013-05-09 DIAGNOSIS — IMO0002 Reserved for concepts with insufficient information to code with codable children: Secondary | ICD-10-CM | POA: Diagnosis not present

## 2013-05-09 DIAGNOSIS — M549 Dorsalgia, unspecified: Secondary | ICD-10-CM

## 2013-05-09 LAB — GLUCOSE, CAPILLARY: Glucose-Capillary: 130 mg/dL — ABNORMAL HIGH (ref 70–99)

## 2013-05-09 NOTE — Progress Notes (Signed)
Patient ID: [name] [name], male   DOB: 28-May-1932, 79 y.o.   MRN: [id number] This is an acute visit.  Local care skilled.  Facility [facility].  History of present illness.  Patient is a pleasant elderly resident here for rehabilitation after sustaining a back injury after falling from a tractor.  Workup did not really reveal any acute process but did show degenerative changes of his lumbar spine.  He is doing better in regards to this-he is taking his Vicodin it appears 1-3 times a day he is also on tramadol takes this sporadically--and also takes a muscle relaxer at times.  However at night he says he still has some pain and would really prefer a nonnarcotic treatment.  .  Family medical social history has been reviewed per discharge summary on 04/30/2013.  Medications have been reviewed per MAR.  Review of systems.  General denies fever or chills.  Respiratory does not   complain of shortness breath or cough-he is completing a course of Mucinex and a prednisone taper for suspected  respiratory issue URI several days ago this is better  . Cardiac-denies any chest pain.  Muscle skeletal-as stated in history of present illness does have back pain is doing much better with this but still has pain at times.  Physical exam.  Temperature is 97.4 pulse 84 respirations 20 blood pressure 134/76.  In general this is a very pleasant elderly male in no distress sitting comfortably in bed he is able to sit up in his bed with much less difficulty and pain compared to last week  . His skin is warm and dry.--low medial left leg appears to have a healing abrasion with darkened tissue but no increased erythema around it  Chest is clear to auscultation without rhonchi rales or wheezes.  Heart is irregular irregular rate and rhythm without murmur gallop or rub.  Muscle skeletal-back exam is baseline I do note a small abrasion left thorax area this appears to be healing unremarkably-I do not note  any deformity of the lumbar spine-however there is some tenderness to palpation of the left back although this appears less tender than when I assessed that last week.  Labs.  05/06/2013.  WBC 8.9 hemoglobin 13.8 platelets 279.  INR 2.67.  Sodium 140 potassium 4.4 BUN 20 creatinine 0.93.  Assessment and plan.  #1-back pain-this is improved but still bothers him a bit-will try to avoid further narcotics-will write an order for bio freeze-to be provided by physical therapy will have therapy look at this  Continue to monitor  this appears headed in the right direction.  #2-left leg lesion-this appears to be a healing abrasion-however we will have to keep an eye on it it does not appear to be infected at this point  [id number]

## 2013-05-10 LAB — GLUCOSE, CAPILLARY
Glucose-Capillary: 201 mg/dL — ABNORMAL HIGH (ref 70–99)
Glucose-Capillary: 145 mg/dL — ABNORMAL HIGH (ref 70–99)
Glucose-Capillary: 138 mg/dL — ABNORMAL HIGH (ref 70–99)

## 2013-05-11 LAB — GLUCOSE, CAPILLARY: Glucose-Capillary: 145 mg/dL — ABNORMAL HIGH (ref 70–99)

## 2013-05-12 LAB — GLUCOSE, CAPILLARY
Glucose-Capillary: 140 mg/dL — ABNORMAL HIGH (ref 70–99)
Glucose-Capillary: 184 mg/dL — ABNORMAL HIGH (ref 70–99)

## 2013-05-13 ENCOUNTER — Non-Acute Institutional Stay (SKILLED_NURSING_FACILITY): Payer: Medicare Other | Admitting: Internal Medicine

## 2013-05-13 DIAGNOSIS — I4891 Unspecified atrial fibrillation: Secondary | ICD-10-CM

## 2013-05-13 DIAGNOSIS — T8130XA Disruption of wound, unspecified, initial encounter: Secondary | ICD-10-CM

## 2013-05-13 LAB — GLUCOSE, CAPILLARY: Glucose-Capillary: 187 mg/dL — ABNORMAL HIGH (ref 70–99)

## 2013-05-13 NOTE — Progress Notes (Signed)
Patient ID: [name] [name], male   DOB: 10/12/1932, 79 y.o.   MRN: [id number]  This is an acute visit.  Care skilled.  Facility [facility].  Chief complaint-followup respiratory issues.  History of present illness.  Patient is a pleasant elderly resident here for rehabilitation and strengthening after sustaining a fall from his tractor with back pain with degenerative changes.  [name] [name] I saw him for fairly acute onset of wheezing he also had a cough and green phlegm-he was started on antibiotic Avelox as well as routine duo nebs-Mucinex as well as a prednisone taper.  He appeared to be doing better last night I am following up on this today.  His vital signs are stable he is afebrile-he says he feels much better today he appears to be back at his baseline ambulating in his wheelchair.  Family medical social history reviewed.  Medications reviewed.  Review of systems-.  In general denies any fever or chills.  Respiratory does not complain of any shortness of breath today does have occasional cough.  Cardiac-no complaints of chest pain.  This was then.  He is afebrile O2 sats are in the 90s on room air.  In general this is a somewhat obese elderly male in no distress sitting comfortably in his wheelchair.  The skin is warm and dry.  Oropharynx is clear mucous membranes moist.  Chest is clear to auscultation without any rhonchi rales or wheezes--no labored breathing.  Heart is irregular irregular rate and rhythm without murmur gallop or rub.  Labs.  01/03/2013.  WBC 5.7 hemoglobin 14.1 platelets 232.  Sodium 136 potassium 3.7 BUN 15 creatinine 0.84.  INR-2.50.  Assessment and plan.  #1-cough wheezing chest congestion-this appears significantly improved we'll continue current treatments including when necessary duo nebs-Avelox-Mucinex-and prednisone taper.  #2-history of A. fib on anticoagulation with Coumadin-we'll have to keep a close eye on this since patient  is on antibiotic Avelox-update INR is pending.  [id number]

## 2013-05-14 LAB — GLUCOSE, CAPILLARY: Glucose-Capillary: 128 mg/dL — ABNORMAL HIGH (ref 70–99)

## 2013-05-15 ENCOUNTER — Ambulatory Visit: Payer: Medicare Other | Admitting: Vascular Surgery

## 2013-05-15 ENCOUNTER — Other Ambulatory Visit: Payer: Medicare Other

## 2013-05-15 LAB — GLUCOSE, CAPILLARY
Glucose-Capillary: 147 mg/dL — ABNORMAL HIGH (ref 70–99)
Glucose-Capillary: 145 mg/dL — ABNORMAL HIGH (ref 70–99)

## 2013-05-15 NOTE — Progress Notes (Shared)
Patient ID: [name] [name], male   DOB: 09/02/1932, 79 y.o.   MRN: [id number]           PROGRESS NOTE  DATE:  05/13/2013  FACILITY: [facility]   LEVEL OF CARE:   SNF   Acute Visit   CHIEF COMPLAINT:  Pre-discharge review.    HISTORY OF PRESENT ILLNESS:  [name] [name] is a 79 year-old man who was riding a tractor when his tractor suddenly dropped about 18 inches and he developed severe low back pain.  CT scan in the ED showed some shallow disk bulging at L5/S1, but negative for any fracture.  He has mild to moderate central canal and left foraminal narrowing.  The patient's pain has been considerably better with therapy and he is now ready for consideration of discharge.  He is able to get up and stand on his own.  He is walking with a walker.  He expresses concern about his frail wife at home and how he is having to pay for most of her care with nursing assistance.      He had also had a recent injury to his left leg.  A duplex ultrasound was negative for a DVT.  He does have what looks to be a traumatic wound in the setting of some venous stasis.  This is covered with an eschar.   I will change him to Santyl to debride this and then he can follow up with home health.    PHYSICAL EXAMINATION:   SKIN:  INSPECTION:  Left leg:  The area in question is an oval-shaped wound, roughly 1 in x 0.5 in.  There is a thick eschar here.  This will need some debridement, as mentioned.    NEUROLOGICAL:    BALANCE/GAIT:  The patient is able to bring himself to a standing position.  His gait looks fine.  He does not appear to be hindered with pain at all.    ASSESSMENT/PLAN:  Mechanical low back pain, which appears to have resolved.    Traumatic wound in the setting of venous stasis.   This will need to be debrided on my next visit.    CPT CODE: 40981    ADDENDUM:  He is on Coumadin for atrial fibrillation, 5 mg.  His INR today is 2.43.  I will check this again on Wednesday.  However, I do not  think this should hinder his discharge.

## 2013-05-15 NOTE — Progress Notes (Signed)
Patient ID: [name] [name], male   DOB: [birth year], 79 y.o.   MRN: [id number] Facility; [facility] Chief complaint; review of left leg wound, predischarge.  History; is a gentleman who came here suffering acute low back pain. Imaging studies did not show a fracture and the pain is already somewhat better. He is mobilizing him going home.. patient had had a prior reasons. All as well. This left him with a small oval-shaped wound on his left anterior leg. This was covered with a blackened eschar. Using a small scalpel. I removed this occurred underneath that was there was what is clearly a resolving hematoma. This is small, but has some depth.  Impression/plan #1 left leg traumatic wound. I have removed. The eschar, which had a small hematoma. This will need Santyl occlusive kerlix Coban changed every 3x per week #2 acute low back pain currently much better. The patient will return home. He is mobilizing well. #3 atrial fibrillation on Coumadin. His INR is 2-5 on 5 mg per day. He follows with Dr.Luking.

## 2013-05-17 DIAGNOSIS — S81009A Unspecified open wound, unspecified knee, initial encounter: Secondary | ICD-10-CM | POA: Diagnosis not present

## 2013-05-17 DIAGNOSIS — M159 Polyosteoarthritis, unspecified: Secondary | ICD-10-CM | POA: Diagnosis not present

## 2013-05-17 DIAGNOSIS — I4891 Unspecified atrial fibrillation: Secondary | ICD-10-CM | POA: Diagnosis not present

## 2013-05-17 DIAGNOSIS — E119 Type 2 diabetes mellitus without complications: Secondary | ICD-10-CM | POA: Diagnosis not present

## 2013-05-17 DIAGNOSIS — M545 Low back pain: Secondary | ICD-10-CM | POA: Diagnosis not present

## 2013-05-17 DIAGNOSIS — Z8673 Personal history of transient ischemic attack (TIA), and cerebral infarction without residual deficits: Secondary | ICD-10-CM | POA: Diagnosis not present

## 2013-05-20 DIAGNOSIS — M545 Low back pain: Secondary | ICD-10-CM | POA: Diagnosis not present

## 2013-05-20 DIAGNOSIS — I4891 Unspecified atrial fibrillation: Secondary | ICD-10-CM | POA: Diagnosis not present

## 2013-05-20 DIAGNOSIS — M159 Polyosteoarthritis, unspecified: Secondary | ICD-10-CM | POA: Diagnosis not present

## 2013-05-20 DIAGNOSIS — Z8673 Personal history of transient ischemic attack (TIA), and cerebral infarction without residual deficits: Secondary | ICD-10-CM | POA: Diagnosis not present

## 2013-05-20 DIAGNOSIS — E119 Type 2 diabetes mellitus without complications: Secondary | ICD-10-CM | POA: Diagnosis not present

## 2013-05-20 DIAGNOSIS — S81009A Unspecified open wound, unspecified knee, initial encounter: Secondary | ICD-10-CM | POA: Diagnosis not present

## 2013-05-21 ENCOUNTER — Telehealth: Payer: Self-pay | Admitting: Family Medicine

## 2013-05-21 DIAGNOSIS — M545 Low back pain: Secondary | ICD-10-CM | POA: Diagnosis not present

## 2013-05-21 DIAGNOSIS — Z8673 Personal history of transient ischemic attack (TIA), and cerebral infarction without residual deficits: Secondary | ICD-10-CM | POA: Diagnosis not present

## 2013-05-21 DIAGNOSIS — M159 Polyosteoarthritis, unspecified: Secondary | ICD-10-CM | POA: Diagnosis not present

## 2013-05-21 DIAGNOSIS — E119 Type 2 diabetes mellitus without complications: Secondary | ICD-10-CM | POA: Diagnosis not present

## 2013-05-21 DIAGNOSIS — S81009A Unspecified open wound, unspecified knee, initial encounter: Secondary | ICD-10-CM | POA: Diagnosis not present

## 2013-05-21 DIAGNOSIS — I4891 Unspecified atrial fibrillation: Secondary | ICD-10-CM | POA: Diagnosis not present

## 2013-05-21 MED ORDER — COLLAGENASE 250 UNIT/GM EX OINT: TOPICAL_OINTMENT | Freq: Three times a day (TID) | CUTANEOUS | Status: DC

## 2013-05-21 NOTE — Telephone Encounter (Signed)
Sarah, Nurse from [facility] called to see if Patient could get a prescription phoned in for Santyl Cream to use 3 times a day to would.  [facility] - [place]  Please call Maralyn Sago to let her know -- [phone]

## 2013-05-21 NOTE — Telephone Encounter (Signed)
RX sent into pharmacy. Maralyn Sago was notified. Per Dr. [name]

## 2013-05-22 DIAGNOSIS — E119 Type 2 diabetes mellitus without complications: Secondary | ICD-10-CM | POA: Diagnosis not present

## 2013-05-22 DIAGNOSIS — S81809A Unspecified open wound, unspecified lower leg, initial encounter: Secondary | ICD-10-CM | POA: Diagnosis not present

## 2013-05-22 DIAGNOSIS — S81009A Unspecified open wound, unspecified knee, initial encounter: Secondary | ICD-10-CM | POA: Diagnosis not present

## 2013-05-22 DIAGNOSIS — M545 Low back pain: Secondary | ICD-10-CM | POA: Diagnosis not present

## 2013-05-22 DIAGNOSIS — M159 Polyosteoarthritis, unspecified: Secondary | ICD-10-CM | POA: Diagnosis not present

## 2013-05-22 DIAGNOSIS — I4891 Unspecified atrial fibrillation: Secondary | ICD-10-CM | POA: Diagnosis not present

## 2013-05-22 DIAGNOSIS — Z8673 Personal history of transient ischemic attack (TIA), and cerebral infarction without residual deficits: Secondary | ICD-10-CM | POA: Diagnosis not present

## 2013-05-23 DIAGNOSIS — I4891 Unspecified atrial fibrillation: Secondary | ICD-10-CM | POA: Diagnosis not present

## 2013-05-23 DIAGNOSIS — M159 Polyosteoarthritis, unspecified: Secondary | ICD-10-CM | POA: Diagnosis not present

## 2013-05-23 DIAGNOSIS — S81009A Unspecified open wound, unspecified knee, initial encounter: Secondary | ICD-10-CM | POA: Diagnosis not present

## 2013-05-23 DIAGNOSIS — Z8673 Personal history of transient ischemic attack (TIA), and cerebral infarction without residual deficits: Secondary | ICD-10-CM | POA: Diagnosis not present

## 2013-05-23 DIAGNOSIS — E119 Type 2 diabetes mellitus without complications: Secondary | ICD-10-CM | POA: Diagnosis not present

## 2013-05-23 DIAGNOSIS — M545 Low back pain: Secondary | ICD-10-CM | POA: Diagnosis not present

## 2013-05-24 ENCOUNTER — Ambulatory Visit (INDEPENDENT_AMBULATORY_CARE_PROVIDER_SITE_OTHER): Payer: Medicare Other | Admitting: Family Medicine

## 2013-05-24 ENCOUNTER — Encounter: Payer: Self-pay | Admitting: Family Medicine

## 2013-05-24 DIAGNOSIS — M549 Dorsalgia, unspecified: Secondary | ICD-10-CM

## 2013-05-24 DIAGNOSIS — Z7901 Long term (current) use of anticoagulants: Secondary | ICD-10-CM

## 2013-05-24 MED ORDER — DIAZEPAM 5 MG PO TABS: ORAL_TABLET | ORAL | Status: DC

## 2013-05-24 NOTE — Progress Notes (Signed)
  Subjective:    Patient ID: [name] [name], male    DOB: [birth year], 79 y.o.   MRN: [id number]  HPIhere for a follow up. Discharged from [facility] last week. He fell off tractor and hurt his back.  Check wound on left leg. He hit leg with lawnmower over 1 month ago. Patient was recently in the hospital and then he was in the nursing home he is now being followed as an outpatient by the nurse he has a wound on the lower leg that needs continued home health attention. In addition to this patient does have hyperlipidemia diabetes and sleep problems in addition to this he also has heart disease. He also has chronic back pain and leg pain PMH see above. Family history noncontributory social does not smoke lives with his wife  Review of Systems He denies chest tightness pressure pain denies headaches denies rectal bleeding hematuria denies difficulty breathing, no fever or chills.    Objective:   Physical Exam He has a lower leg wound approximately three-eighths of an inch wide and she'll all lungs are clear heart irregular rate controlled pulse normal extremities no edema abdomen soft  Hospital records were reviewed while patient was present     Assessment & Plan:  #1 chronic Coumadin, INR good recheck first week of August #2 leg wound-has special cream that she is using to help home health comes out 3 times a week #3 diabetes stable 25 minutes spent with patient going over all of this issues. [id number] patient to followup first week of August for INR recheck of leg, dressing was applied to the leg

## 2013-05-24 NOTE — Patient Instructions (Signed)
Use sansyl medicine three times a week  As for the valium ( diazepam) 5 mg tablet, use 1/2 tablet at night as needed for sleep. Use a whole tablet only if 1/2 doesn't work  Follow up in first week of August

## 2013-05-27 DIAGNOSIS — M545 Low back pain: Secondary | ICD-10-CM | POA: Diagnosis not present

## 2013-05-27 DIAGNOSIS — S81009A Unspecified open wound, unspecified knee, initial encounter: Secondary | ICD-10-CM | POA: Diagnosis not present

## 2013-05-27 DIAGNOSIS — S91009A Unspecified open wound, unspecified ankle, initial encounter: Secondary | ICD-10-CM

## 2013-05-27 DIAGNOSIS — Z8673 Personal history of transient ischemic attack (TIA), and cerebral infarction without residual deficits: Secondary | ICD-10-CM | POA: Diagnosis not present

## 2013-05-27 DIAGNOSIS — I4891 Unspecified atrial fibrillation: Secondary | ICD-10-CM | POA: Diagnosis not present

## 2013-05-27 DIAGNOSIS — S81809A Unspecified open wound, unspecified lower leg, initial encounter: Secondary | ICD-10-CM

## 2013-05-27 DIAGNOSIS — M159 Polyosteoarthritis, unspecified: Secondary | ICD-10-CM | POA: Diagnosis not present

## 2013-05-27 DIAGNOSIS — E119 Type 2 diabetes mellitus without complications: Secondary | ICD-10-CM | POA: Diagnosis not present

## 2013-05-28 DIAGNOSIS — S81009A Unspecified open wound, unspecified knee, initial encounter: Secondary | ICD-10-CM | POA: Diagnosis not present

## 2013-05-28 DIAGNOSIS — E119 Type 2 diabetes mellitus without complications: Secondary | ICD-10-CM | POA: Diagnosis not present

## 2013-05-28 DIAGNOSIS — M159 Polyosteoarthritis, unspecified: Secondary | ICD-10-CM | POA: Diagnosis not present

## 2013-05-28 DIAGNOSIS — M545 Low back pain: Secondary | ICD-10-CM | POA: Diagnosis not present

## 2013-05-28 DIAGNOSIS — S81809A Unspecified open wound, unspecified lower leg, initial encounter: Secondary | ICD-10-CM | POA: Diagnosis not present

## 2013-05-28 DIAGNOSIS — Z8673 Personal history of transient ischemic attack (TIA), and cerebral infarction without residual deficits: Secondary | ICD-10-CM | POA: Diagnosis not present

## 2013-05-28 DIAGNOSIS — I4891 Unspecified atrial fibrillation: Secondary | ICD-10-CM | POA: Diagnosis not present

## 2013-05-30 DIAGNOSIS — Z8673 Personal history of transient ischemic attack (TIA), and cerebral infarction without residual deficits: Secondary | ICD-10-CM | POA: Diagnosis not present

## 2013-05-30 DIAGNOSIS — S91009A Unspecified open wound, unspecified ankle, initial encounter: Secondary | ICD-10-CM | POA: Diagnosis not present

## 2013-05-30 DIAGNOSIS — M159 Polyosteoarthritis, unspecified: Secondary | ICD-10-CM | POA: Diagnosis not present

## 2013-05-30 DIAGNOSIS — M545 Low back pain: Secondary | ICD-10-CM | POA: Diagnosis not present

## 2013-05-30 DIAGNOSIS — S81009A Unspecified open wound, unspecified knee, initial encounter: Secondary | ICD-10-CM | POA: Diagnosis not present

## 2013-05-30 DIAGNOSIS — E119 Type 2 diabetes mellitus without complications: Secondary | ICD-10-CM | POA: Diagnosis not present

## 2013-05-30 DIAGNOSIS — I4891 Unspecified atrial fibrillation: Secondary | ICD-10-CM | POA: Diagnosis not present

## 2013-05-31 DIAGNOSIS — Z8673 Personal history of transient ischemic attack (TIA), and cerebral infarction without residual deficits: Secondary | ICD-10-CM | POA: Diagnosis not present

## 2013-05-31 DIAGNOSIS — E119 Type 2 diabetes mellitus without complications: Secondary | ICD-10-CM | POA: Diagnosis not present

## 2013-05-31 DIAGNOSIS — S81009A Unspecified open wound, unspecified knee, initial encounter: Secondary | ICD-10-CM | POA: Diagnosis not present

## 2013-05-31 DIAGNOSIS — M545 Low back pain: Secondary | ICD-10-CM | POA: Diagnosis not present

## 2013-05-31 DIAGNOSIS — I4891 Unspecified atrial fibrillation: Secondary | ICD-10-CM | POA: Diagnosis not present

## 2013-05-31 DIAGNOSIS — M159 Polyosteoarthritis, unspecified: Secondary | ICD-10-CM | POA: Diagnosis not present

## 2013-06-03 DIAGNOSIS — M545 Low back pain: Secondary | ICD-10-CM | POA: Diagnosis not present

## 2013-06-03 DIAGNOSIS — E119 Type 2 diabetes mellitus without complications: Secondary | ICD-10-CM | POA: Diagnosis not present

## 2013-06-03 DIAGNOSIS — I4891 Unspecified atrial fibrillation: Secondary | ICD-10-CM | POA: Diagnosis not present

## 2013-06-03 DIAGNOSIS — S81009A Unspecified open wound, unspecified knee, initial encounter: Secondary | ICD-10-CM | POA: Diagnosis not present

## 2013-06-03 DIAGNOSIS — M159 Polyosteoarthritis, unspecified: Secondary | ICD-10-CM | POA: Diagnosis not present

## 2013-06-03 DIAGNOSIS — Z8673 Personal history of transient ischemic attack (TIA), and cerebral infarction without residual deficits: Secondary | ICD-10-CM | POA: Diagnosis not present

## 2013-06-05 DIAGNOSIS — Z8673 Personal history of transient ischemic attack (TIA), and cerebral infarction without residual deficits: Secondary | ICD-10-CM | POA: Diagnosis not present

## 2013-06-05 DIAGNOSIS — E119 Type 2 diabetes mellitus without complications: Secondary | ICD-10-CM | POA: Diagnosis not present

## 2013-06-05 DIAGNOSIS — I4891 Unspecified atrial fibrillation: Secondary | ICD-10-CM | POA: Diagnosis not present

## 2013-06-05 DIAGNOSIS — M545 Low back pain: Secondary | ICD-10-CM | POA: Diagnosis not present

## 2013-06-05 DIAGNOSIS — M159 Polyosteoarthritis, unspecified: Secondary | ICD-10-CM | POA: Diagnosis not present

## 2013-06-05 DIAGNOSIS — S81009A Unspecified open wound, unspecified knee, initial encounter: Secondary | ICD-10-CM | POA: Diagnosis not present

## 2013-06-07 DIAGNOSIS — M545 Low back pain: Secondary | ICD-10-CM | POA: Diagnosis not present

## 2013-06-07 DIAGNOSIS — E119 Type 2 diabetes mellitus without complications: Secondary | ICD-10-CM | POA: Diagnosis not present

## 2013-06-07 DIAGNOSIS — M159 Polyosteoarthritis, unspecified: Secondary | ICD-10-CM | POA: Diagnosis not present

## 2013-06-07 DIAGNOSIS — Z8673 Personal history of transient ischemic attack (TIA), and cerebral infarction without residual deficits: Secondary | ICD-10-CM | POA: Diagnosis not present

## 2013-06-07 DIAGNOSIS — S81009A Unspecified open wound, unspecified knee, initial encounter: Secondary | ICD-10-CM | POA: Diagnosis not present

## 2013-06-07 DIAGNOSIS — I4891 Unspecified atrial fibrillation: Secondary | ICD-10-CM | POA: Diagnosis not present

## 2013-06-10 ENCOUNTER — Telehealth: Payer: Self-pay | Admitting: Family Medicine

## 2013-06-10 DIAGNOSIS — E119 Type 2 diabetes mellitus without complications: Secondary | ICD-10-CM | POA: Diagnosis not present

## 2013-06-10 DIAGNOSIS — Z8673 Personal history of transient ischemic attack (TIA), and cerebral infarction without residual deficits: Secondary | ICD-10-CM | POA: Diagnosis not present

## 2013-06-10 DIAGNOSIS — M159 Polyosteoarthritis, unspecified: Secondary | ICD-10-CM | POA: Diagnosis not present

## 2013-06-10 DIAGNOSIS — S81009A Unspecified open wound, unspecified knee, initial encounter: Secondary | ICD-10-CM | POA: Diagnosis not present

## 2013-06-10 DIAGNOSIS — I4891 Unspecified atrial fibrillation: Secondary | ICD-10-CM | POA: Diagnosis not present

## 2013-06-10 DIAGNOSIS — M545 Low back pain: Secondary | ICD-10-CM | POA: Diagnosis not present

## 2013-06-10 DIAGNOSIS — S81809A Unspecified open wound, unspecified lower leg, initial encounter: Secondary | ICD-10-CM | POA: Diagnosis not present

## 2013-06-10 NOTE — Telephone Encounter (Signed)
Patient had a fall Friday. He fell out of bed Friday night. He had a little pain and swelling in his right arm, but it has pretty much healed. Just an [place]

## 2013-06-12 DIAGNOSIS — S91009A Unspecified open wound, unspecified ankle, initial encounter: Secondary | ICD-10-CM | POA: Diagnosis not present

## 2013-06-12 DIAGNOSIS — I4891 Unspecified atrial fibrillation: Secondary | ICD-10-CM | POA: Diagnosis not present

## 2013-06-12 DIAGNOSIS — E119 Type 2 diabetes mellitus without complications: Secondary | ICD-10-CM | POA: Diagnosis not present

## 2013-06-12 DIAGNOSIS — M159 Polyosteoarthritis, unspecified: Secondary | ICD-10-CM | POA: Diagnosis not present

## 2013-06-12 DIAGNOSIS — M545 Low back pain: Secondary | ICD-10-CM | POA: Diagnosis not present

## 2013-06-12 DIAGNOSIS — S81009A Unspecified open wound, unspecified knee, initial encounter: Secondary | ICD-10-CM | POA: Diagnosis not present

## 2013-06-12 DIAGNOSIS — Z8673 Personal history of transient ischemic attack (TIA), and cerebral infarction without residual deficits: Secondary | ICD-10-CM | POA: Diagnosis not present

## 2013-06-14 ENCOUNTER — Encounter: Payer: Self-pay | Admitting: Family Medicine

## 2013-06-14 ENCOUNTER — Ambulatory Visit (INDEPENDENT_AMBULATORY_CARE_PROVIDER_SITE_OTHER): Payer: Medicare Other | Admitting: Family Medicine

## 2013-06-14 VITALS — BP 130/72 | HR 90 | Wt 247.4 lb

## 2013-06-14 DIAGNOSIS — Z5189 Encounter for other specified aftercare: Secondary | ICD-10-CM

## 2013-06-14 DIAGNOSIS — S81002D Unspecified open wound, left knee, subsequent encounter: Secondary | ICD-10-CM

## 2013-06-14 DIAGNOSIS — Z7901 Long term (current) use of anticoagulants: Secondary | ICD-10-CM

## 2013-06-14 DIAGNOSIS — M549 Dorsalgia, unspecified: Secondary | ICD-10-CM

## 2013-06-14 NOTE — Patient Instructions (Addendum)
Keep coumadin the same, recheck in 1 month  Your blood thinner test was 2.0 which is where it needs to be. Keep coumadin as is  Next office visit in 2 months

## 2013-06-14 NOTE — Progress Notes (Signed)
  Subjective:    Patient ID: [name] [name], male    DOB: [birth year], 79 y.o.   MRN: [id number]  HPI Patient is here for an follow up on back pain says he is still having pain and his arms are burning This patient actually states that he is doing much better he relates he's not having to take much in the way of medications other than tramadol for pain. He also relates that he's not having any bleeding issues the open wound on his leg is actually healing. Home health comes out dresses it twice a week. He also states his back pain is present but not as bad  He's under a lot of stress because his son apparently has a terminal illness less in 6 months to live he denies being depressed but he does relate being stressed  Past medical history was reviewed. Family history view. Social doesn't smoke  Review of Systems He denies chest pain or shortness of breath    Objective:   Physical Exam Lungs are clear hearts ir- regular pulse normal rate is controlled Extremities trace edema noted on the lower leg right leg seems to be healing well INR was checked was acceptable      Assessment & Plan:  Chronic Coumadin-continue Coumadin as is. Recheck this in one month Leg wound-continue home health doing wound care until it's healing. Back pain-improving tramadol only currently. Patient states he is not taking hydrocodone. Office visit with me in 2 months sooner if need

## 2013-06-17 DIAGNOSIS — M159 Polyosteoarthritis, unspecified: Secondary | ICD-10-CM | POA: Diagnosis not present

## 2013-06-17 DIAGNOSIS — E119 Type 2 diabetes mellitus without complications: Secondary | ICD-10-CM | POA: Diagnosis not present

## 2013-06-17 DIAGNOSIS — S81009A Unspecified open wound, unspecified knee, initial encounter: Secondary | ICD-10-CM | POA: Diagnosis not present

## 2013-06-17 DIAGNOSIS — Z8673 Personal history of transient ischemic attack (TIA), and cerebral infarction without residual deficits: Secondary | ICD-10-CM | POA: Diagnosis not present

## 2013-06-17 DIAGNOSIS — M545 Low back pain: Secondary | ICD-10-CM | POA: Diagnosis not present

## 2013-06-17 DIAGNOSIS — I4891 Unspecified atrial fibrillation: Secondary | ICD-10-CM | POA: Diagnosis not present

## 2013-06-19 ENCOUNTER — Other Ambulatory Visit: Payer: Self-pay | Admitting: Family Medicine

## 2013-06-19 ENCOUNTER — Telehealth: Payer: Self-pay | Admitting: Family Medicine

## 2013-06-19 DIAGNOSIS — E119 Type 2 diabetes mellitus without complications: Secondary | ICD-10-CM | POA: Diagnosis not present

## 2013-06-19 DIAGNOSIS — I4891 Unspecified atrial fibrillation: Secondary | ICD-10-CM | POA: Diagnosis not present

## 2013-06-19 DIAGNOSIS — M545 Low back pain: Secondary | ICD-10-CM | POA: Diagnosis not present

## 2013-06-19 DIAGNOSIS — M159 Polyosteoarthritis, unspecified: Secondary | ICD-10-CM | POA: Diagnosis not present

## 2013-06-19 DIAGNOSIS — Z8673 Personal history of transient ischemic attack (TIA), and cerebral infarction without residual deficits: Secondary | ICD-10-CM | POA: Diagnosis not present

## 2013-06-19 DIAGNOSIS — S81009A Unspecified open wound, unspecified knee, initial encounter: Secondary | ICD-10-CM | POA: Diagnosis not present

## 2013-06-19 MED ORDER — CITALOPRAM HYDROBROMIDE 10 MG PO TABS: 10.0000 mg | ORAL_TABLET | Freq: Every day | ORAL | Status: DC

## 2013-06-19 MED ORDER — WARFARIN SODIUM 5 MG PO TABS: 5.0000 mg | ORAL_TABLET | Freq: Every evening | ORAL | Status: DC

## 2013-06-19 NOTE — Telephone Encounter (Signed)
Refills sent electronically to [facility]. Patient notified.

## 2013-06-19 NOTE — Telephone Encounter (Signed)
Patient needs Rx for citalopram 10 mg and warfarin 5 mg.. And he would like a refill on his test strips to [facility]

## 2013-06-20 DIAGNOSIS — Z8673 Personal history of transient ischemic attack (TIA), and cerebral infarction without residual deficits: Secondary | ICD-10-CM | POA: Diagnosis not present

## 2013-06-20 DIAGNOSIS — M159 Polyosteoarthritis, unspecified: Secondary | ICD-10-CM | POA: Diagnosis not present

## 2013-06-20 DIAGNOSIS — M545 Low back pain: Secondary | ICD-10-CM | POA: Diagnosis not present

## 2013-06-20 DIAGNOSIS — S91009A Unspecified open wound, unspecified ankle, initial encounter: Secondary | ICD-10-CM | POA: Diagnosis not present

## 2013-06-20 DIAGNOSIS — E119 Type 2 diabetes mellitus without complications: Secondary | ICD-10-CM | POA: Diagnosis not present

## 2013-06-20 DIAGNOSIS — I4891 Unspecified atrial fibrillation: Secondary | ICD-10-CM | POA: Diagnosis not present

## 2013-06-20 DIAGNOSIS — S81009A Unspecified open wound, unspecified knee, initial encounter: Secondary | ICD-10-CM | POA: Diagnosis not present

## 2013-06-24 DIAGNOSIS — E119 Type 2 diabetes mellitus without complications: Secondary | ICD-10-CM | POA: Diagnosis not present

## 2013-06-24 DIAGNOSIS — M159 Polyosteoarthritis, unspecified: Secondary | ICD-10-CM | POA: Diagnosis not present

## 2013-06-24 DIAGNOSIS — I4891 Unspecified atrial fibrillation: Secondary | ICD-10-CM | POA: Diagnosis not present

## 2013-06-24 DIAGNOSIS — Z8673 Personal history of transient ischemic attack (TIA), and cerebral infarction without residual deficits: Secondary | ICD-10-CM | POA: Diagnosis not present

## 2013-06-24 DIAGNOSIS — S91009A Unspecified open wound, unspecified ankle, initial encounter: Secondary | ICD-10-CM | POA: Diagnosis not present

## 2013-06-24 DIAGNOSIS — S81009A Unspecified open wound, unspecified knee, initial encounter: Secondary | ICD-10-CM | POA: Diagnosis not present

## 2013-06-24 DIAGNOSIS — M545 Low back pain: Secondary | ICD-10-CM | POA: Diagnosis not present

## 2013-06-25 DIAGNOSIS — M545 Low back pain: Secondary | ICD-10-CM | POA: Diagnosis not present

## 2013-06-25 DIAGNOSIS — M159 Polyosteoarthritis, unspecified: Secondary | ICD-10-CM | POA: Diagnosis not present

## 2013-06-25 DIAGNOSIS — Z8673 Personal history of transient ischemic attack (TIA), and cerebral infarction without residual deficits: Secondary | ICD-10-CM | POA: Diagnosis not present

## 2013-06-25 DIAGNOSIS — E119 Type 2 diabetes mellitus without complications: Secondary | ICD-10-CM | POA: Diagnosis not present

## 2013-06-25 DIAGNOSIS — S81809A Unspecified open wound, unspecified lower leg, initial encounter: Secondary | ICD-10-CM | POA: Diagnosis not present

## 2013-06-25 DIAGNOSIS — I4891 Unspecified atrial fibrillation: Secondary | ICD-10-CM | POA: Diagnosis not present

## 2013-06-25 DIAGNOSIS — S81009A Unspecified open wound, unspecified knee, initial encounter: Secondary | ICD-10-CM | POA: Diagnosis not present

## 2013-06-26 DIAGNOSIS — E119 Type 2 diabetes mellitus without complications: Secondary | ICD-10-CM | POA: Diagnosis not present

## 2013-06-26 DIAGNOSIS — Z8673 Personal history of transient ischemic attack (TIA), and cerebral infarction without residual deficits: Secondary | ICD-10-CM | POA: Diagnosis not present

## 2013-06-26 DIAGNOSIS — M545 Low back pain: Secondary | ICD-10-CM | POA: Diagnosis not present

## 2013-06-26 DIAGNOSIS — I4891 Unspecified atrial fibrillation: Secondary | ICD-10-CM | POA: Diagnosis not present

## 2013-06-26 DIAGNOSIS — S81009A Unspecified open wound, unspecified knee, initial encounter: Secondary | ICD-10-CM | POA: Diagnosis not present

## 2013-06-26 DIAGNOSIS — M159 Polyosteoarthritis, unspecified: Secondary | ICD-10-CM | POA: Diagnosis not present

## 2013-06-26 DIAGNOSIS — S81809A Unspecified open wound, unspecified lower leg, initial encounter: Secondary | ICD-10-CM | POA: Diagnosis not present

## 2013-06-27 DIAGNOSIS — M545 Low back pain: Secondary | ICD-10-CM | POA: Diagnosis not present

## 2013-06-27 DIAGNOSIS — I4891 Unspecified atrial fibrillation: Secondary | ICD-10-CM | POA: Diagnosis not present

## 2013-06-27 DIAGNOSIS — S91009A Unspecified open wound, unspecified ankle, initial encounter: Secondary | ICD-10-CM | POA: Diagnosis not present

## 2013-06-27 DIAGNOSIS — M159 Polyosteoarthritis, unspecified: Secondary | ICD-10-CM | POA: Diagnosis not present

## 2013-06-27 DIAGNOSIS — E119 Type 2 diabetes mellitus without complications: Secondary | ICD-10-CM | POA: Diagnosis not present

## 2013-06-27 DIAGNOSIS — Z8673 Personal history of transient ischemic attack (TIA), and cerebral infarction without residual deficits: Secondary | ICD-10-CM | POA: Diagnosis not present

## 2013-06-27 DIAGNOSIS — S81009A Unspecified open wound, unspecified knee, initial encounter: Secondary | ICD-10-CM | POA: Diagnosis not present

## 2013-07-01 DIAGNOSIS — I4891 Unspecified atrial fibrillation: Secondary | ICD-10-CM | POA: Diagnosis not present

## 2013-07-01 DIAGNOSIS — E119 Type 2 diabetes mellitus without complications: Secondary | ICD-10-CM | POA: Diagnosis not present

## 2013-07-01 DIAGNOSIS — M159 Polyosteoarthritis, unspecified: Secondary | ICD-10-CM | POA: Diagnosis not present

## 2013-07-01 DIAGNOSIS — Z8673 Personal history of transient ischemic attack (TIA), and cerebral infarction without residual deficits: Secondary | ICD-10-CM | POA: Diagnosis not present

## 2013-07-01 DIAGNOSIS — S81009A Unspecified open wound, unspecified knee, initial encounter: Secondary | ICD-10-CM | POA: Diagnosis not present

## 2013-07-01 DIAGNOSIS — M545 Low back pain: Secondary | ICD-10-CM | POA: Diagnosis not present

## 2013-07-03 DIAGNOSIS — S81009A Unspecified open wound, unspecified knee, initial encounter: Secondary | ICD-10-CM | POA: Diagnosis not present

## 2013-07-03 DIAGNOSIS — Z8673 Personal history of transient ischemic attack (TIA), and cerebral infarction without residual deficits: Secondary | ICD-10-CM | POA: Diagnosis not present

## 2013-07-03 DIAGNOSIS — E119 Type 2 diabetes mellitus without complications: Secondary | ICD-10-CM | POA: Diagnosis not present

## 2013-07-03 DIAGNOSIS — M545 Low back pain: Secondary | ICD-10-CM | POA: Diagnosis not present

## 2013-07-03 DIAGNOSIS — I4891 Unspecified atrial fibrillation: Secondary | ICD-10-CM | POA: Diagnosis not present

## 2013-07-03 DIAGNOSIS — M159 Polyosteoarthritis, unspecified: Secondary | ICD-10-CM | POA: Diagnosis not present

## 2013-07-10 DIAGNOSIS — Z8673 Personal history of transient ischemic attack (TIA), and cerebral infarction without residual deficits: Secondary | ICD-10-CM | POA: Diagnosis not present

## 2013-07-10 DIAGNOSIS — I4891 Unspecified atrial fibrillation: Secondary | ICD-10-CM | POA: Diagnosis not present

## 2013-07-10 DIAGNOSIS — S81009A Unspecified open wound, unspecified knee, initial encounter: Secondary | ICD-10-CM | POA: Diagnosis not present

## 2013-07-10 DIAGNOSIS — E119 Type 2 diabetes mellitus without complications: Secondary | ICD-10-CM | POA: Diagnosis not present

## 2013-07-10 DIAGNOSIS — M545 Low back pain: Secondary | ICD-10-CM | POA: Diagnosis not present

## 2013-07-10 DIAGNOSIS — M159 Polyosteoarthritis, unspecified: Secondary | ICD-10-CM | POA: Diagnosis not present

## 2013-07-15 DIAGNOSIS — I4891 Unspecified atrial fibrillation: Secondary | ICD-10-CM | POA: Diagnosis not present

## 2013-07-15 DIAGNOSIS — E119 Type 2 diabetes mellitus without complications: Secondary | ICD-10-CM | POA: Diagnosis not present

## 2013-07-15 DIAGNOSIS — S81009A Unspecified open wound, unspecified knee, initial encounter: Secondary | ICD-10-CM | POA: Diagnosis not present

## 2013-07-15 DIAGNOSIS — M545 Low back pain: Secondary | ICD-10-CM | POA: Diagnosis not present

## 2013-07-15 DIAGNOSIS — M159 Polyosteoarthritis, unspecified: Secondary | ICD-10-CM | POA: Diagnosis not present

## 2013-07-15 DIAGNOSIS — Z8673 Personal history of transient ischemic attack (TIA), and cerebral infarction without residual deficits: Secondary | ICD-10-CM | POA: Diagnosis not present

## 2013-07-16 ENCOUNTER — Ambulatory Visit (INDEPENDENT_AMBULATORY_CARE_PROVIDER_SITE_OTHER): Payer: Medicare Other | Admitting: *Deleted

## 2013-07-16 DIAGNOSIS — I4891 Unspecified atrial fibrillation: Secondary | ICD-10-CM | POA: Diagnosis not present

## 2013-07-16 DIAGNOSIS — Z7901 Long term (current) use of anticoagulants: Secondary | ICD-10-CM

## 2013-07-18 DIAGNOSIS — H2589 Other age-related cataract: Secondary | ICD-10-CM | POA: Diagnosis not present

## 2013-08-06 ENCOUNTER — Ambulatory Visit (INDEPENDENT_AMBULATORY_CARE_PROVIDER_SITE_OTHER): Payer: Medicare Other | Admitting: Family Medicine

## 2013-08-06 ENCOUNTER — Encounter: Payer: Self-pay | Admitting: Family Medicine

## 2013-08-06 ENCOUNTER — Other Ambulatory Visit: Payer: Self-pay | Admitting: Family Medicine

## 2013-08-06 VITALS — BP 128/80 | Temp 97.2°F | Ht 73.0 in | Wt 257.8 lb

## 2013-08-06 DIAGNOSIS — E119 Type 2 diabetes mellitus without complications: Secondary | ICD-10-CM

## 2013-08-06 DIAGNOSIS — R21 Rash and other nonspecific skin eruption: Secondary | ICD-10-CM

## 2013-08-06 DIAGNOSIS — E785 Hyperlipidemia, unspecified: Secondary | ICD-10-CM

## 2013-08-06 DIAGNOSIS — I10 Essential (primary) hypertension: Secondary | ICD-10-CM

## 2013-08-06 DIAGNOSIS — Z79899 Other long term (current) drug therapy: Secondary | ICD-10-CM

## 2013-08-06 MED ORDER — METOPROLOL SUCCINATE ER 25 MG PO TB24: 25.0000 mg | ORAL_TABLET | ORAL | Status: DC

## 2013-08-06 MED ORDER — METHYLPREDNISOLONE ACETATE 40 MG/ML IJ SUSP
40.0000 mg | Freq: Once | INTRAMUSCULAR | Status: AC
  Administered 2013-08-06: 40 mg via INTRAMUSCULAR

## 2013-08-06 MED ORDER — FUROSEMIDE 20 MG PO TABS: 20.0000 mg | ORAL_TABLET | Freq: Two times a day (BID) | ORAL | Status: DC

## 2013-08-06 MED ORDER — CITALOPRAM HYDROBROMIDE 10 MG PO TABS: 10.0000 mg | ORAL_TABLET | Freq: Every day | ORAL | Status: DC

## 2013-08-06 MED ORDER — POTASSIUM CHLORIDE ER 10 MEQ PO TBCR: 20.0000 meq | EXTENDED_RELEASE_TABLET | Freq: Every day | ORAL | Status: DC

## 2013-08-06 NOTE — Progress Notes (Signed)
  Subjective:    Patient ID: [name] [name], male    DOB: 1932/02/10, 79 y.o.   MRN: [id number]  Rash This is a new problem. The current episode started more than 1 month ago. The problem has been gradually worsening since onset. The affected locations include the left arm, right arm, back, left upper leg, left lower leg, right lower leg and right upper leg. The rash is characterized by scaling, redness, peeling, itchiness and burning. Past treatments include antibiotic cream and anti-itch cream. The treatment provided no relief.   It is hard to figure out what is causing this rash it is not certain he feels like it's unlikely to be anything he is eating he has not made any major medicine changes lately also he uses traditional soaps for his laundry   Review of Systems  Skin: Positive for rash.       Objective:   Physical Exam  He has a non-distinct ear edematous rash on his back and upper chest he also has dry skin on his arms and legs no cellulitis.      Assessment & Plan:  Rash-Depo-Medrol this might upset his diabetes controlled but I would recommend that we get him set up with dermatology. Patient will also do some lab work and followup here for office visit regarding his diabetes in the near future

## 2013-08-07 ENCOUNTER — Encounter: Payer: Self-pay | Admitting: Family Medicine

## 2013-08-07 LAB — LIPID PANEL
HDL: 36 mg/dL — ABNORMAL LOW (ref >39)
LDL Cholesterol: 191 mg/dL — ABNORMAL HIGH (ref 0–99)

## 2013-08-07 LAB — CBC WITH DIFFERENTIAL/PLATELET
Basophils Relative: 1 % (ref 0–1)
Eosinophils Absolute: 0.4 10*3/uL (ref 0.0–0.7)
Eosinophils Relative: 6 % — ABNORMAL HIGH (ref 0–5)
HCT: 44.2 % (ref 39.0–52.0)
Hemoglobin: 15 g/dL (ref 13.0–17.0)
MCH: 29.9 pg (ref 26.0–34.0)
MCHC: 33.9 g/dL (ref 30.0–36.0)
Monocytes Absolute: 0.5 10*3/uL (ref 0.1–1.0)
Monocytes Relative: 7 % (ref 3–12)

## 2013-08-07 LAB — BASIC METABOLIC PANEL
BUN: 10 mg/dL (ref 6–23)
CO2: 31 mEq/L (ref 19–32)
Chloride: 100 mEq/L (ref 96–112)
Creat: 0.95 mg/dL (ref 0.50–1.35)

## 2013-08-08 DIAGNOSIS — L259 Unspecified contact dermatitis, unspecified cause: Secondary | ICD-10-CM | POA: Diagnosis not present

## 2013-08-15 ENCOUNTER — Ambulatory Visit (INDEPENDENT_AMBULATORY_CARE_PROVIDER_SITE_OTHER): Payer: Medicare Other | Admitting: Family Medicine

## 2013-08-15 ENCOUNTER — Encounter: Payer: Self-pay | Admitting: Family Medicine

## 2013-08-15 ENCOUNTER — Ambulatory Visit: Payer: Medicare Other

## 2013-08-15 ENCOUNTER — Ambulatory Visit: Payer: Medicare Other | Admitting: Family Medicine

## 2013-08-15 VITALS — BP 100/78 | Ht 73.0 in | Wt 245.5 lb

## 2013-08-15 DIAGNOSIS — E119 Type 2 diabetes mellitus without complications: Secondary | ICD-10-CM | POA: Diagnosis not present

## 2013-08-15 DIAGNOSIS — Z7901 Long term (current) use of anticoagulants: Secondary | ICD-10-CM

## 2013-08-15 DIAGNOSIS — I10 Essential (primary) hypertension: Secondary | ICD-10-CM | POA: Diagnosis not present

## 2013-08-15 DIAGNOSIS — I4891 Unspecified atrial fibrillation: Secondary | ICD-10-CM | POA: Diagnosis not present

## 2013-08-15 DIAGNOSIS — E785 Hyperlipidemia, unspecified: Secondary | ICD-10-CM

## 2013-08-15 DIAGNOSIS — Z23 Encounter for immunization: Secondary | ICD-10-CM

## 2013-08-15 LAB — POCT INR: INR: 1.9

## 2013-08-15 NOTE — Progress Notes (Signed)
  Subjective:    Patient ID: [name] [name], male    DOB: 05/01/1932, 79 y.o.   MRN: [id number]  Diabetes He presents for his follow-up diabetic visit. He has type 2 diabetes mellitus. His disease course has been stable. There are no hypoglycemic associated symptoms. There are no diabetic associated symptoms. There are no hypoglycemic complications. Symptoms are stable. There are no diabetic complications. There are no known risk factors for coronary artery disease. Current diabetic treatment includes oral agent (monotherapy). He is compliant with treatment all of the time.  Patient also here for INR check. Patient states he has no concerns today. Lab results already done and in the system. The patient was seen today as part of a comprehensive diabetic check up. The patient had the following elements completed: -Review of medication compliance -Review of glucose monitoring results -Review of any complications do to high or low sugars -Diabetic foot exam was completed as part of today's visit. The following was also discussed: -Importance of yearly eye exams -Importance of following diabetic/low sugar-starch diet -Importance of exercise and regular activity -Importance of regular followup visits. -Most recent hemoglobin A1c were reviewed with the patient along with goals regarding diabetes. Patient also has hyperlipidemia but he cannot tolerate statins she tries to watch his diet as best he can Also has a history of heart disease and hypertension. The denies any angina pressure pain he does get little short of breath when he moves around Also takes chronic Coumadin to prevent strokes denies any bleeding with it. No hematuria or rectal bleeding. Family history noncontributory social does not smoke he is more physically active he's been losing some weight because of it and trying eat healthier.   Review of Systems See above    Objective:   Physical Exam  Vitals reviewed. Constitutional: He  appears well-developed and well-nourished.  HENT:  Right Ear: External ear normal.  Left Ear: External ear normal.  Cardiovascular: Normal rate and normal heart sounds.   No murmur heard. Pulmonary/Chest: Effort normal and breath sounds normal. No respiratory distress. He has no wheezes.  Abdominal: Soft. He exhibits no distension.  Musculoskeletal: He exhibits no edema.  Lymphadenopathy:    He has no cervical adenopathy.  Skin: Skin is warm and dry.          Assessment & Plan:  DM- good control, recheck in 3 months Lipid- diet, can't tolerate statins HTN-good, good control. Continue current medications.  Atrial fibrillation under good control Coumadin looking good. Stick with current measures. Flu shot today. Followup 3 months, monthly INR

## 2013-09-02 ENCOUNTER — Other Ambulatory Visit: Payer: Self-pay | Admitting: Family Medicine

## 2013-09-02 NOTE — Telephone Encounter (Signed)
Okay x4 

## 2013-09-17 ENCOUNTER — Ambulatory Visit (INDEPENDENT_AMBULATORY_CARE_PROVIDER_SITE_OTHER): Payer: Medicare Other | Admitting: *Deleted

## 2013-09-17 DIAGNOSIS — Z7901 Long term (current) use of anticoagulants: Secondary | ICD-10-CM

## 2013-09-17 NOTE — Patient Instructions (Signed)
Take one tablet every day. Recheck in 2-3 weeks.

## 2013-09-27 DIAGNOSIS — H11129 Conjunctival concretions, unspecified eye: Secondary | ICD-10-CM | POA: Diagnosis not present

## 2013-09-30 ENCOUNTER — Other Ambulatory Visit: Payer: Self-pay | Admitting: Family Medicine

## 2013-10-01 NOTE — Telephone Encounter (Signed)
Ok times 4 

## 2013-10-02 ENCOUNTER — Other Ambulatory Visit: Payer: Self-pay | Admitting: Family Medicine

## 2013-10-08 ENCOUNTER — Ambulatory Visit (INDEPENDENT_AMBULATORY_CARE_PROVIDER_SITE_OTHER): Payer: Medicare Other | Admitting: *Deleted

## 2013-10-08 DIAGNOSIS — Z7901 Long term (current) use of anticoagulants: Secondary | ICD-10-CM

## 2013-10-08 DIAGNOSIS — I4891 Unspecified atrial fibrillation: Secondary | ICD-10-CM

## 2013-11-06 ENCOUNTER — Ambulatory Visit (INDEPENDENT_AMBULATORY_CARE_PROVIDER_SITE_OTHER): Payer: Medicare Other | Admitting: Family Medicine

## 2013-11-06 ENCOUNTER — Encounter: Payer: Self-pay | Admitting: Family Medicine

## 2013-11-06 VITALS — BP 138/86 | Ht 73.0 in | Wt 240.0 lb

## 2013-11-06 DIAGNOSIS — I10 Essential (primary) hypertension: Secondary | ICD-10-CM

## 2013-11-06 DIAGNOSIS — E785 Hyperlipidemia, unspecified: Secondary | ICD-10-CM

## 2013-11-06 DIAGNOSIS — Z7901 Long term (current) use of anticoagulants: Secondary | ICD-10-CM | POA: Diagnosis not present

## 2013-11-06 DIAGNOSIS — I4891 Unspecified atrial fibrillation: Secondary | ICD-10-CM

## 2013-11-06 DIAGNOSIS — E119 Type 2 diabetes mellitus without complications: Secondary | ICD-10-CM

## 2013-11-06 LAB — POCT INR: INR: 2.9

## 2013-11-06 NOTE — Progress Notes (Signed)
   Subjective:    Patient ID: [name] [name], male    DOB: 1932/05/15, 79 y.o.   MRN: [id number]  HPI Patient arrives for a follow up on blood pressure and an INR. INR 2.9 patient reports taking Coumadin 5 mg every day. Patient reports no problems or concerns. Patient denies any bleeding problems. Denies any chest tightness pressure pain shortness breath. He does have a history hypertension chronic pain with his ankles from previous fracture he states he tries to use pain medicine sparingly. He does try to watch his diet. States his sugars have been looking okay. Has history of diabetes as well. Family history noncontributory patient doesn't smoke   Review of Systems See above.    Objective:   Physical Exam  Vitals reviewed. Constitutional: He appears well-nourished. No distress.  Cardiovascular: Normal rate and normal heart sounds.   No murmur heard. Patient does have atrial fibrillation. Rate is controlled.  Pulmonary/Chest: Effort normal and breath sounds normal. No respiratory distress.  Musculoskeletal: He exhibits no edema.  Lymphadenopathy:    He has no cervical adenopathy.  Neurological: He is alert.  Psychiatric: His behavior is normal.          Assessment & Plan:  Atrial fib-rate controlled currently Hyperlipidemia does not tolerate statins Chronic ankle pain uses pain medication intermittently he Chronic Coumadin INR looks good recheck it again in one month followup office visit in 3 months

## 2013-11-13 ENCOUNTER — Ambulatory Visit: Payer: Medicare Other | Admitting: Family Medicine

## 2013-11-13 DIAGNOSIS — J392 Other diseases of pharynx: Secondary | ICD-10-CM | POA: Diagnosis not present

## 2013-11-13 DIAGNOSIS — J45909 Unspecified asthma, uncomplicated: Secondary | ICD-10-CM | POA: Diagnosis not present

## 2013-11-15 ENCOUNTER — Ambulatory Visit (INDEPENDENT_AMBULATORY_CARE_PROVIDER_SITE_OTHER): Payer: Medicare Other | Admitting: Family Medicine

## 2013-11-15 ENCOUNTER — Encounter: Payer: Self-pay | Admitting: Family Medicine

## 2013-11-15 ENCOUNTER — Ambulatory Visit: Payer: Medicare Other | Admitting: Family Medicine

## 2013-11-15 ENCOUNTER — Telehealth: Payer: Self-pay | Admitting: *Deleted

## 2013-11-15 ENCOUNTER — Other Ambulatory Visit: Payer: Self-pay | Admitting: Family Medicine

## 2013-11-15 VITALS — Temp 98.2°F | Ht 73.0 in | Wt 240.0 lb

## 2013-11-15 DIAGNOSIS — J209 Acute bronchitis, unspecified: Secondary | ICD-10-CM | POA: Diagnosis not present

## 2013-11-15 DIAGNOSIS — J189 Pneumonia, unspecified organism: Secondary | ICD-10-CM

## 2013-11-15 MED ORDER — ALBUTEROL SULFATE HFA 108 (90 BASE) MCG/ACT IN AERS: 2.0000 | INHALATION_SPRAY | RESPIRATORY_TRACT | Status: DC | PRN

## 2013-11-15 MED ORDER — PREDNISONE 20 MG PO TABS
ORAL_TABLET | ORAL | Status: AC
Start: 2013-11-15 — End: 2013-11-24

## 2013-11-15 NOTE — Telephone Encounter (Signed)
Pt seen at urgent care yesterday.  Diagnosed with pneumonia. Having congestion and wheezing. Feeling worse today. Taking cefdinir 300mg  BID for 10 days and OTC cough med. Consult with Dr. Nicki Reaper. Ventolin HFA 2 puffs q 4 hours prn wheezing and office visit today. Med sent to pharm. Pt made appt today.

## 2013-11-15 NOTE — Progress Notes (Signed)
   Subjective:    Patient ID: [name] [name], male    DOB: July 31, 1932, 80 y.o.   MRN: [id number]  Wheezing  This is a new problem. The current episode started in the past 7 days. Associated symptoms include coughing and rhinorrhea. Pertinent negatives include no chest pain, ear pain or fever.   No fever Went to urgent care 2 days ago PMH HTN atrial film on Coumadin  Review of Systems  Constitutional: Negative for fever and activity change.  HENT: Positive for congestion and rhinorrhea. Negative for ear pain.   Eyes: Negative for discharge.  Respiratory: Positive for cough and wheezing.   Cardiovascular: Negative for chest pain.       Objective:   Physical Exam  Nursing note and vitals reviewed. Constitutional: He appears well-developed.  HENT:  Head: Normocephalic.  Mouth/Throat: Oropharynx is clear and moist. No oropharyngeal exudate.  Neck: Normal range of motion.  Cardiovascular: Normal rate, regular rhythm and normal heart sounds.   No murmur heard. Pulmonary/Chest: Effort normal. He has wheezes.  Moving air well  Lymphadenopathy:    He has no cervical adenopathy.  Neurological: He exhibits normal muscle tone.  Skin: Skin is warm and dry.          Assessment & Plan:  This patient has what is more than likely walking pneumonia but there is no sign of any type of serious underlying issue I don't recommend blood work or x-ray currently warning signs were discussed prednisone taper along with watching his diet plus also antibiotics and inhalers he seemed to understand nebulizer treatment did help go to the ER if worse followup next week for recheck

## 2013-11-26 ENCOUNTER — Other Ambulatory Visit: Payer: Self-pay | Admitting: Family Medicine

## 2013-12-02 DIAGNOSIS — L538 Other specified erythematous conditions: Secondary | ICD-10-CM | POA: Diagnosis not present

## 2013-12-02 DIAGNOSIS — C44621 Squamous cell carcinoma of skin of unspecified upper limb, including shoulder: Secondary | ICD-10-CM | POA: Diagnosis not present

## 2013-12-02 DIAGNOSIS — L570 Actinic keratosis: Secondary | ICD-10-CM | POA: Diagnosis not present

## 2013-12-05 ENCOUNTER — Ambulatory Visit (INDEPENDENT_AMBULATORY_CARE_PROVIDER_SITE_OTHER): Payer: Medicare Other | Admitting: Family Medicine

## 2013-12-05 ENCOUNTER — Encounter: Payer: Self-pay | Admitting: Family Medicine

## 2013-12-05 VITALS — BP 100/70 | Ht 73.0 in | Wt 259.0 lb

## 2013-12-05 DIAGNOSIS — Z7901 Long term (current) use of anticoagulants: Secondary | ICD-10-CM

## 2013-12-05 DIAGNOSIS — Z0189 Encounter for other specified special examinations: Secondary | ICD-10-CM | POA: Diagnosis not present

## 2013-12-05 DIAGNOSIS — E119 Type 2 diabetes mellitus without complications: Secondary | ICD-10-CM

## 2013-12-05 DIAGNOSIS — I4891 Unspecified atrial fibrillation: Secondary | ICD-10-CM

## 2013-12-05 DIAGNOSIS — E782 Mixed hyperlipidemia: Secondary | ICD-10-CM | POA: Diagnosis not present

## 2013-12-05 DIAGNOSIS — Z79899 Other long term (current) drug therapy: Secondary | ICD-10-CM

## 2013-12-05 LAB — GLUCOSE, POCT (MANUAL RESULT ENTRY): POC GLUCOSE: 230 mg/dL — AB (ref 70–99)

## 2013-12-05 LAB — POCT INR: INR: 3.9

## 2013-12-05 MED ORDER — MUPIROCIN 2 % EX OINT: TOPICAL_OINTMENT | CUTANEOUS | Status: AC

## 2013-12-05 MED ORDER — GLIPIZIDE ER 2.5 MG PO TB24: 2.5000 mg | ORAL_TABLET | Freq: Every day | ORAL | Status: DC

## 2013-12-05 NOTE — Progress Notes (Addendum)
   Subjective:    Patient ID: [name] [name], male    DOB: [birth year], 80 y.o.   MRN: [id number]  HPI Patient is here today because he has a spot on his left arm that he is concerned about. He states that Dr. 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 His INR today is 3.9 and glucose is 230.   INR glucose noted Review of Systems Denies excessive thirst denies chest pain shortness of breath relate some soreness in the left no bleeding issues    Objective:   Physical Exam The area on his left arm shows worries had this removed but does not show any sign of any type of cancer or infection  Lungs clear       Assessment & Plan:  #1 left arm sore secondary to skin growth removal from dermatologist-Bactroban ointment apply daily for the next couple weeks  #2 patient will do comprehensive lab work followup for a diabetic hyperlipidemia recheck  #3 test shows his blood is thinner than what it needs to be no Coumadin today reduce Coumadin to half a tablet on Mondays and Fridays 1 on all other days recheck INR in 2 weeks  Also his insurance will no longer cover glove you ride. Switch to glipizide 2.5 mg XL. May have to increase the dose of this as time moves forward. Follow his sugars accordingly.

## 2013-12-09 ENCOUNTER — Telehealth: Payer: Self-pay | Admitting: Family Medicine

## 2013-12-09 NOTE — Telephone Encounter (Signed)
Not to my knowledge.

## 2013-12-09 NOTE — Telephone Encounter (Signed)
Patient calling to find out if the [facility] has been in touch with anyone at our office.

## 2013-12-09 NOTE — Telephone Encounter (Signed)
Patient was notified.

## 2013-12-10 DIAGNOSIS — E119 Type 2 diabetes mellitus without complications: Secondary | ICD-10-CM | POA: Diagnosis not present

## 2013-12-10 DIAGNOSIS — E782 Mixed hyperlipidemia: Secondary | ICD-10-CM | POA: Diagnosis not present

## 2013-12-10 DIAGNOSIS — Z79899 Other long term (current) drug therapy: Secondary | ICD-10-CM | POA: Diagnosis not present

## 2013-12-10 LAB — HEPATIC FUNCTION PANEL
ALBUMIN: 3.8 g/dL (ref 3.5–5.2)
ALK PHOS: 58 U/L (ref 39–117)
ALT: 21 U/L (ref 0–53)
AST: 14 U/L (ref 0–37)
BILIRUBIN INDIRECT: 0.4 mg/dL (ref 0.0–0.9)
BILIRUBIN TOTAL: 0.5 mg/dL (ref 0.3–1.2)
Bilirubin, Direct: 0.1 mg/dL (ref 0.0–0.3)
Total Protein: 6.4 g/dL (ref 6.0–8.3)

## 2013-12-10 LAB — LIPID PANEL
Cholesterol: 247 mg/dL — ABNORMAL HIGH (ref 0–200)
HDL: 43 mg/dL (ref >39)
LDL CALC: 175 mg/dL — AB (ref 0–99)
Total CHOL/HDL Ratio: 5.7 Ratio
Triglycerides: 147 mg/dL (ref <150)
VLDL: 29 mg/dL (ref 0–40)

## 2013-12-10 LAB — BASIC METABOLIC PANEL
BUN: 12 mg/dL (ref 6–23)
CHLORIDE: 100 meq/L (ref 96–112)
CO2: 31 meq/L (ref 19–32)
CREATININE: 0.9 mg/dL (ref 0.50–1.35)
Calcium: 9.3 mg/dL (ref 8.4–10.5)
Glucose, Bld: 176 mg/dL — ABNORMAL HIGH (ref 70–99)
Potassium: 4.8 mEq/L (ref 3.5–5.3)
Sodium: 141 mEq/L (ref 135–145)

## 2013-12-10 LAB — HEMOGLOBIN A1C
Hgb A1c MFr Bld: 8.1 % — ABNORMAL HIGH (ref <5.7)
Mean Plasma Glucose: 186 mg/dL — ABNORMAL HIGH (ref <117)

## 2013-12-19 ENCOUNTER — Encounter: Payer: Self-pay | Admitting: Family Medicine

## 2013-12-19 ENCOUNTER — Ambulatory Visit (INDEPENDENT_AMBULATORY_CARE_PROVIDER_SITE_OTHER): Payer: Medicare Other | Admitting: Family Medicine

## 2013-12-19 VITALS — BP 118/70 | Ht 73.0 in | Wt 255.0 lb

## 2013-12-19 DIAGNOSIS — I10 Essential (primary) hypertension: Secondary | ICD-10-CM

## 2013-12-19 DIAGNOSIS — I4891 Unspecified atrial fibrillation: Secondary | ICD-10-CM | POA: Diagnosis not present

## 2013-12-19 DIAGNOSIS — E119 Type 2 diabetes mellitus without complications: Secondary | ICD-10-CM | POA: Diagnosis not present

## 2013-12-19 DIAGNOSIS — Z7901 Long term (current) use of anticoagulants: Secondary | ICD-10-CM

## 2013-12-19 DIAGNOSIS — E785 Hyperlipidemia, unspecified: Secondary | ICD-10-CM

## 2013-12-19 LAB — POCT INR: INR: 1.3

## 2013-12-19 MED ORDER — GLIPIZIDE ER 5 MG PO TB24: ORAL_TABLET | ORAL | Status: DC

## 2013-12-19 NOTE — Progress Notes (Signed)
   Subjective:    Patient ID: [name] [name], male    DOB: [birth year], 80 y.o.   MRN: [id number]  HPIFollow up on bloodwork. No conerns. He cannot tolerate statins. But he does try to watch his diet as best as he can. Patient denies any rectal bleeding denies nausea vomiting diarrhea.  INR 1.3 today. I tried to explain to the patient his need Coumadin dose I do write it down for him. Patient had multiple questions but seemed to understand.  Diabetes subpar control A1c went up we went ahead and increase the dose of his medicine from 2.5 to -5 mg. patient denies any low sugar spells. He does state he tries the healthy and try to minimize sugar intake  Patient does relate he does have some fatigue and tiredness has difficult time exercising because of that.    Review of Systems  Constitutional: Negative for activity change, appetite change and fatigue.  HENT: Negative for congestion.   Respiratory: Negative for cough and chest tightness.   Cardiovascular: Negative for chest pain.  Gastrointestinal: Negative for abdominal pain.  Endocrine: Negative for polydipsia and polyphagia.  Neurological: Negative for weakness.  Psychiatric/Behavioral: Negative for confusion.       Objective:   Physical Exam  Vitals reviewed. Constitutional: He appears well-nourished. No distress.  Cardiovascular: Normal rate, regular rhythm and normal heart sounds.   No murmur heard. Pulmonary/Chest: Effort normal and breath sounds normal. No respiratory distress.  Abdominal: Soft.  Musculoskeletal: He exhibits no edema.  Lymphadenopathy:    He has no cervical adenopathy.  Neurological: He is alert.  Psychiatric: His behavior is normal.          Assessment & Plan:  #1 diabetes subpar control adjust medication from 2.5 now will use 5 mg extended release. We will look at A1c again in 3 months  #2 hyperlipidemia does not tolerate statins healthy diet stress  #3 hypertension good control continue  current measures  #4 atrial fibrillation on Coumadin no bleeding complications adjusted medicine recheck INR in 2 weeks.  #5 foot and ankle pain with walking patient was encouraged to walk as much as possible/as he tolerates

## 2013-12-24 ENCOUNTER — Other Ambulatory Visit: Payer: Self-pay | Admitting: Family Medicine

## 2013-12-25 ENCOUNTER — Other Ambulatory Visit: Payer: Self-pay | Admitting: Family Medicine

## 2014-01-03 ENCOUNTER — Ambulatory Visit (INDEPENDENT_AMBULATORY_CARE_PROVIDER_SITE_OTHER): Payer: Medicare Other | Admitting: *Deleted

## 2014-01-03 DIAGNOSIS — Z7901 Long term (current) use of anticoagulants: Secondary | ICD-10-CM | POA: Diagnosis not present

## 2014-01-03 LAB — POCT INR: INR: 2.2

## 2014-01-15 DIAGNOSIS — E119 Type 2 diabetes mellitus without complications: Secondary | ICD-10-CM | POA: Diagnosis not present

## 2014-01-15 DIAGNOSIS — H2510 Age-related nuclear cataract, unspecified eye: Secondary | ICD-10-CM | POA: Diagnosis not present

## 2014-01-31 ENCOUNTER — Ambulatory Visit: Payer: Medicare Other

## 2014-02-04 ENCOUNTER — Ambulatory Visit (INDEPENDENT_AMBULATORY_CARE_PROVIDER_SITE_OTHER): Payer: Medicare Other | Admitting: *Deleted

## 2014-02-04 ENCOUNTER — Ambulatory Visit: Payer: Medicare Other | Admitting: Family Medicine

## 2014-02-04 DIAGNOSIS — Z7901 Long term (current) use of anticoagulants: Secondary | ICD-10-CM | POA: Diagnosis not present

## 2014-02-04 LAB — POCT INR: INR: 2.1

## 2014-02-04 NOTE — Patient Instructions (Signed)
Continue the same and recheck in one month.

## 2014-02-19 ENCOUNTER — Other Ambulatory Visit: Payer: Self-pay

## 2014-02-19 ENCOUNTER — Telehealth: Payer: Self-pay | Admitting: Family Medicine

## 2014-02-19 ENCOUNTER — Other Ambulatory Visit: Payer: Self-pay | Admitting: *Deleted

## 2014-02-19 MED ORDER — ZOLPIDEM TARTRATE 10 MG PO TABS: 10.0000 mg | ORAL_TABLET | Freq: Every evening | ORAL | Status: DC | PRN

## 2014-02-19 NOTE — Telephone Encounter (Signed)
Patient needs Rx for his ambien sent to [facility]. They said they have not received it.

## 2014-02-19 NOTE — Telephone Encounter (Signed)
Script faxed to [employer] [facility]. Patient was notified.

## 2014-03-07 ENCOUNTER — Ambulatory Visit (INDEPENDENT_AMBULATORY_CARE_PROVIDER_SITE_OTHER): Payer: Medicare Other | Admitting: Family Medicine

## 2014-03-07 ENCOUNTER — Ambulatory Visit: Payer: Medicare Other

## 2014-03-07 ENCOUNTER — Encounter: Payer: Self-pay | Admitting: Family Medicine

## 2014-03-07 VITALS — Ht 73.0 in | Wt 255.0 lb

## 2014-03-07 DIAGNOSIS — Z7901 Long term (current) use of anticoagulants: Secondary | ICD-10-CM | POA: Diagnosis not present

## 2014-03-07 DIAGNOSIS — E119 Type 2 diabetes mellitus without complications: Secondary | ICD-10-CM | POA: Diagnosis not present

## 2014-03-07 DIAGNOSIS — I10 Essential (primary) hypertension: Secondary | ICD-10-CM

## 2014-03-07 DIAGNOSIS — J309 Allergic rhinitis, unspecified: Secondary | ICD-10-CM

## 2014-03-07 DIAGNOSIS — J019 Acute sinusitis, unspecified: Secondary | ICD-10-CM | POA: Diagnosis not present

## 2014-03-07 LAB — POCT INR: INR: 2.6

## 2014-03-07 MED ORDER — FLUTICASONE PROPIONATE 50 MCG/ACT NA SUSP: 1.0000 | Freq: Every day | NASAL | Status: DC | PRN

## 2014-03-07 MED ORDER — CEFPROZIL 500 MG PO TABS: 500.0000 mg | ORAL_TABLET | Freq: Two times a day (BID) | ORAL | Status: DC

## 2014-03-07 NOTE — Progress Notes (Signed)
   Subjective:    Patient ID: [name] [name], male    DOB: Jan 13, 1932, 80 y.o.   MRN: [id number]  HPI Patient arrives for a sore on his right arm. Patient also due an INR and having problems with headaches. INR 2.6. Patient was confused about his medicine he brings all of him and he like for [place] to go over each one of mom. He denies any particular problems with his medicines. Patient understands that he's not quite is healthy and young as he used to be he denies any other particular trouble denies any chest tightness pressure pain shortness breath nausea vomiting. He denies any rectal bleeding. No hematuria. Denies chest flutters. Patient does relate bilateral ankle pain with being on his feet a lot. He denies any low sugar spells states his glucose readings have been doing okay He states his allergies been giving him trouble with sinus pressure sinus pain and discomfort. Review of Systems See above    Objective:   Physical Exam Lungs are clear hearts are regular rate is controlled he has a skin tear on the right arm with some small amount of associated cellulitis extremities trace edema       Assessment & Plan:  #1 he will followup in approximate months we'll check hemoglobin A1c at that time #2 Coumadin doing well continue INR as is and medication as is #3 recent cellulitis around his right arm plus sinus infection 7 days of antibiotics #4 cognitive issues-patient starting to have some cognitive issues as well as his wife. I went overall of his medicines wrote them down when to take them. We will look into this further on followup 25 minutes spent with patient

## 2014-03-24 ENCOUNTER — Other Ambulatory Visit: Payer: Self-pay | Admitting: Family Medicine

## 2014-04-03 ENCOUNTER — Ambulatory Visit (INDEPENDENT_AMBULATORY_CARE_PROVIDER_SITE_OTHER): Payer: Medicare Other | Admitting: Family Medicine

## 2014-04-03 ENCOUNTER — Encounter: Payer: Self-pay | Admitting: Family Medicine

## 2014-04-03 VITALS — Ht 73.0 in | Wt 263.0 lb

## 2014-04-03 DIAGNOSIS — Z7901 Long term (current) use of anticoagulants: Secondary | ICD-10-CM | POA: Diagnosis not present

## 2014-04-03 DIAGNOSIS — E119 Type 2 diabetes mellitus without complications: Secondary | ICD-10-CM | POA: Diagnosis not present

## 2014-04-03 LAB — POCT GLYCOSYLATED HEMOGLOBIN (HGB A1C): Hemoglobin A1C: 7.3

## 2014-04-03 LAB — POCT INR: INR: 3.2

## 2014-04-03 NOTE — Progress Notes (Signed)
   Subjective:    Patient ID: [name] [name], male    DOB: 01-19-1932, 80 y.o.   MRN: [id number]  Diabetes He presents for his follow-up diabetic visit. He has type 2 diabetes mellitus. Pertinent negatives for hypoglycemia include no confusion. Pertinent negatives for diabetes include no chest pain, no fatigue, no polydipsia, no polyphagia and no weakness. His lunch blood glucose range is generally >200 mg/dl. He sees a podiatrist.Eye exam is current.  A1C 7.3 All of his medications were reviewed in detail today. INR today 3.2   Recheck cellulitis on arm. This is actually doing better.   Review of Systems  Constitutional: Negative for activity change, appetite change and fatigue.  HENT: Negative for congestion.   Respiratory: Negative for cough.   Cardiovascular: Negative for chest pain.  Gastrointestinal: Negative for abdominal pain.  Endocrine: Negative for polydipsia and polyphagia.  Neurological: Negative for weakness.  Psychiatric/Behavioral: Negative for confusion.       Objective:   Physical Exam  Vitals reviewed. Constitutional: He appears well-nourished. No distress.  Cardiovascular: Normal rate and normal heart sounds.   No murmur heard. Pulmonary/Chest: Effort normal and breath sounds normal. No respiratory distress.  Musculoskeletal: He exhibits no edema.  Lymphadenopathy:    He has no cervical adenopathy.  Neurological: He is alert.  Psychiatric: His behavior is normal.          Assessment & Plan:  #1 chronic Coumadin adjusted dose today. Recheck INR in 4 weeks  #2 atrial fibrillation rate is controlled well.  #3 diabetes A1c looks good continue current measures  #4 chronic ankle pain due to previous fracture tramadol as necessary for pain  #5 insomnia Ambien prescribed tolerates this well  #6 patient was encouraged to watch diet try to lose weight.  #7 INR in 4 weeks' time office visit 3 months time

## 2014-04-21 ENCOUNTER — Other Ambulatory Visit: Payer: Self-pay | Admitting: Family Medicine

## 2014-05-01 ENCOUNTER — Ambulatory Visit (INDEPENDENT_AMBULATORY_CARE_PROVIDER_SITE_OTHER): Payer: Medicare Other | Admitting: *Deleted

## 2014-05-01 ENCOUNTER — Telehealth: Payer: Self-pay | Admitting: *Deleted

## 2014-05-01 DIAGNOSIS — Z7901 Long term (current) use of anticoagulants: Secondary | ICD-10-CM

## 2014-05-01 LAB — POCT INR: INR: 3.1

## 2014-05-01 MED ORDER — TRIAMCINOLONE ACETONIDE 0.1 % EX CREA: 1.0000 "application " | TOPICAL_CREAM | Freq: Two times a day (BID) | CUTANEOUS | Status: DC

## 2014-05-01 MED ORDER — GABAPENTIN 100 MG PO CAPS: 100.0000 mg | ORAL_CAPSULE | Freq: Two times a day (BID) | ORAL | Status: DC

## 2014-05-01 NOTE — Telephone Encounter (Signed)
Pt in today for INR. Would like rx for the burning in his feet. States this has been going on for a long time.  Has taken nortriptyline in the past.  Also has a sore on his arm that will not heal. Can a cream be called into [facility] to deliver.

## 2014-05-01 NOTE — Patient Instructions (Signed)
Continue the same dose and recheck in 4 weeks.

## 2014-05-01 NOTE — Telephone Encounter (Signed)
#  1 for the arm try Kenalog cream 0.1%, apply twice a day if ongoing he will need a formal office visit. #2 try gabapentin 100 mg 1 twice a day for the feet. #60, 3 refills, followup office visit in several weeks, 4 weeks, to see how this is doing.

## 2014-05-01 NOTE — Telephone Encounter (Signed)
Medication sent to pharmacy. Patient transferred to front desk to schedule appointment in 4 weeks.

## 2014-05-13 ENCOUNTER — Other Ambulatory Visit: Payer: Self-pay

## 2014-05-13 MED ORDER — TRAMADOL HCL 50 MG PO TABS: 50.0000 mg | ORAL_TABLET | Freq: Three times a day (TID) | ORAL | Status: DC | PRN

## 2014-05-30 ENCOUNTER — Encounter: Payer: Self-pay | Admitting: Family Medicine

## 2014-05-30 ENCOUNTER — Ambulatory Visit (INDEPENDENT_AMBULATORY_CARE_PROVIDER_SITE_OTHER): Payer: Medicare Other | Admitting: Family Medicine

## 2014-05-30 VITALS — BP 134/82 | Ht 73.0 in | Wt 260.4 lb

## 2014-05-30 DIAGNOSIS — E0842 Diabetes mellitus due to underlying condition with diabetic polyneuropathy: Secondary | ICD-10-CM

## 2014-05-30 DIAGNOSIS — M25579 Pain in unspecified ankle and joints of unspecified foot: Secondary | ICD-10-CM

## 2014-05-30 DIAGNOSIS — E0821 Diabetes mellitus due to underlying condition with diabetic nephropathy: Secondary | ICD-10-CM

## 2014-05-30 DIAGNOSIS — E1329 Other specified diabetes mellitus with other diabetic kidney complication: Secondary | ICD-10-CM | POA: Diagnosis not present

## 2014-05-30 DIAGNOSIS — N058 Unspecified nephritic syndrome with other morphologic changes: Secondary | ICD-10-CM

## 2014-05-30 DIAGNOSIS — Z7901 Long term (current) use of anticoagulants: Secondary | ICD-10-CM | POA: Diagnosis not present

## 2014-05-30 DIAGNOSIS — E1140 Type 2 diabetes mellitus with diabetic neuropathy, unspecified: Secondary | ICD-10-CM | POA: Insufficient documentation

## 2014-05-30 DIAGNOSIS — E1349 Other specified diabetes mellitus with other diabetic neurological complication: Secondary | ICD-10-CM

## 2014-05-30 DIAGNOSIS — M25571 Pain in right ankle and joints of right foot: Secondary | ICD-10-CM

## 2014-05-30 DIAGNOSIS — G8929 Other chronic pain: Secondary | ICD-10-CM

## 2014-05-30 DIAGNOSIS — E1142 Type 2 diabetes mellitus with diabetic polyneuropathy: Secondary | ICD-10-CM | POA: Insufficient documentation

## 2014-05-30 LAB — POCT INR: INR: 1.9

## 2014-05-30 MED ORDER — GABAPENTIN 100 MG PO CAPS: 200.0000 mg | ORAL_CAPSULE | Freq: Two times a day (BID) | ORAL | Status: DC

## 2014-05-30 MED ORDER — HYDROCODONE-ACETAMINOPHEN 5-325 MG PO TABS: 1.0000 | ORAL_TABLET | Freq: Four times a day (QID) | ORAL | Status: DC | PRN

## 2014-05-30 NOTE — Progress Notes (Signed)
   Subjective:    Patient ID: [name] [name], male    DOB: 1931/11/19, 80 y.o.   MRN: [id number]  HPI Patient arrives for complaint of swelling in feet and hands. Patient also complains of dry mouth. Patient's INR 1.9 C/o pain in the feet Relates right ankle pain then foot pain Worse in the evening and worse with walking Relates hand /finger pain No hand stiffness C/o r ankle poain with walikng Some sciatica Discussion held regarding chronic ankle pain discussion held regarding his Coumadin discussion regarding his perceived swelling in his hands and legs also discussion held regarding neuropathy in his feet which is felt to be diabetic neuropathy Review of Systems  Constitutional: Negative for activity change, appetite change and fatigue.  HENT: Negative for congestion.   Respiratory: Negative for cough.   Cardiovascular: Negative for chest pain.  Gastrointestinal: Negative for abdominal pain.  Endocrine: Negative for polydipsia and polyphagia.  Neurological: Negative for weakness.  Psychiatric/Behavioral: Negative for confusion.       Objective:   Physical Exam Heart irregular but rate is controlled lungs are clear no crackles abdomen soft obese slight ankle edema on the right side has restricted range of motion do to previous ankle fracture no cyanosis does have mild neuropathy of both feet.       Assessment & Plan:  Neuropathy-increase Neurontin 2 tablets in the morning 2 in the evening 100 mg capsules. Also patient was advised this medication is to try to take away the stinging it will not correct the underlying numbness  Diabetes good control  Chronic Coumadin and doing well INR 1.9 was 3.0 he states he has not changed anything the past few visits it's actually been very good on the same dose therefore we will continue the same dose and recheck the INR again in 4 weeks  Chronic ankle pain tramadol no longer helping stop tramadol and use hydrocodone 5 mg/325 one every 6  hours as needed for severe pain  INR in 4 weeks  Office visit 8 weeks  25 minutes spent with patient

## 2014-06-19 DIAGNOSIS — M999 Biomechanical lesion, unspecified: Secondary | ICD-10-CM | POA: Diagnosis not present

## 2014-06-19 DIAGNOSIS — M545 Low back pain, unspecified: Secondary | ICD-10-CM | POA: Diagnosis not present

## 2014-06-19 DIAGNOSIS — M5430 Sciatica, unspecified side: Secondary | ICD-10-CM | POA: Diagnosis not present

## 2014-06-23 ENCOUNTER — Ambulatory Visit (INDEPENDENT_AMBULATORY_CARE_PROVIDER_SITE_OTHER): Payer: Medicare Other | Admitting: Family Medicine

## 2014-06-23 ENCOUNTER — Encounter: Payer: Self-pay | Admitting: Family Medicine

## 2014-06-23 VITALS — BP 128/82 | Ht 73.0 in | Wt 262.4 lb

## 2014-06-23 DIAGNOSIS — E1349 Other specified diabetes mellitus with other diabetic neurological complication: Secondary | ICD-10-CM | POA: Diagnosis not present

## 2014-06-23 DIAGNOSIS — M999 Biomechanical lesion, unspecified: Secondary | ICD-10-CM | POA: Diagnosis not present

## 2014-06-23 DIAGNOSIS — M545 Low back pain, unspecified: Secondary | ICD-10-CM | POA: Diagnosis not present

## 2014-06-23 DIAGNOSIS — E0843 Diabetes mellitus due to underlying condition with diabetic autonomic (poly)neuropathy: Secondary | ICD-10-CM

## 2014-06-23 DIAGNOSIS — G909 Disorder of the autonomic nervous system, unspecified: Secondary | ICD-10-CM

## 2014-06-23 DIAGNOSIS — M5430 Sciatica, unspecified side: Secondary | ICD-10-CM | POA: Diagnosis not present

## 2014-06-23 DIAGNOSIS — M546 Pain in thoracic spine: Secondary | ICD-10-CM | POA: Diagnosis not present

## 2014-06-23 LAB — POCT URINALYSIS DIPSTICK
SPEC GRAV UA: 1.02
pH, UA: 7

## 2014-06-23 MED ORDER — HYDROCODONE-ACETAMINOPHEN 5-325 MG PO TABS: 1.0000 | ORAL_TABLET | Freq: Four times a day (QID) | ORAL | Status: DC | PRN

## 2014-06-23 MED ORDER — GABAPENTIN 300 MG PO CAPS: 300.0000 mg | ORAL_CAPSULE | Freq: Two times a day (BID) | ORAL | Status: DC

## 2014-06-23 NOTE — Progress Notes (Signed)
   Subjective:    Patient ID: [name] [name], male    DOB: 1932/03/27, 80 y.o.   MRN: [id number]  HPI Patient arrives with back pain for a while-several months- worse lately seeing chiropractor without relief. Patient feels like he has a pinched nerve in his back.  Patient also complains of severe diabetic neuropathy-patient states he cant hardly stand the pain anymore.    This patient has diabetes atrial fibrillation history of diabetic neuropathy and a history of back pain  Review of Systems    see below. Patient denies fever sweats. Relates back pain relates leg pain relates burning in the feet. Objective:   Physical Exam Low back mild tenderness has subjective discomfort goes down the right leg also has burning in both feet denies any abdominal symptoms denies dysuria denies hematuria denies chills sweats denies nausea vomiting diarrhea His lungs are clear heart rate controlled rate irregular      Assessment & Plan:  Atrial fib chronic Coumadin continue this check INR in another week and a half Diabetic neuropathy of the feet increase Neurontin 300 mg twice daily Lumbar pain-prostate exam is normal urinalysis and under the microscope looks good find no evidence of infection feel that this is probably a pinched nerve no need for x-rays her MRI currently refill of pain medicine given cautioned drowsiness

## 2014-06-26 DIAGNOSIS — M546 Pain in thoracic spine: Secondary | ICD-10-CM | POA: Diagnosis not present

## 2014-06-26 DIAGNOSIS — M545 Low back pain, unspecified: Secondary | ICD-10-CM | POA: Diagnosis not present

## 2014-06-26 DIAGNOSIS — M5430 Sciatica, unspecified side: Secondary | ICD-10-CM | POA: Diagnosis not present

## 2014-06-26 DIAGNOSIS — M999 Biomechanical lesion, unspecified: Secondary | ICD-10-CM | POA: Diagnosis not present

## 2014-06-30 DIAGNOSIS — M5430 Sciatica, unspecified side: Secondary | ICD-10-CM | POA: Diagnosis not present

## 2014-06-30 DIAGNOSIS — M545 Low back pain, unspecified: Secondary | ICD-10-CM | POA: Diagnosis not present

## 2014-06-30 DIAGNOSIS — M999 Biomechanical lesion, unspecified: Secondary | ICD-10-CM | POA: Diagnosis not present

## 2014-06-30 DIAGNOSIS — M546 Pain in thoracic spine: Secondary | ICD-10-CM | POA: Diagnosis not present

## 2014-07-01 ENCOUNTER — Ambulatory Visit: Payer: Medicare Other

## 2014-07-02 ENCOUNTER — Ambulatory Visit (INDEPENDENT_AMBULATORY_CARE_PROVIDER_SITE_OTHER): Payer: Medicare Other | Admitting: *Deleted

## 2014-07-02 ENCOUNTER — Telehealth: Payer: Self-pay | Admitting: Family Medicine

## 2014-07-02 DIAGNOSIS — Z7901 Long term (current) use of anticoagulants: Secondary | ICD-10-CM

## 2014-07-02 DIAGNOSIS — M545 Low back pain, unspecified: Secondary | ICD-10-CM | POA: Diagnosis not present

## 2014-07-02 DIAGNOSIS — M5430 Sciatica, unspecified side: Secondary | ICD-10-CM | POA: Diagnosis not present

## 2014-07-02 DIAGNOSIS — M546 Pain in thoracic spine: Secondary | ICD-10-CM | POA: Diagnosis not present

## 2014-07-02 DIAGNOSIS — M999 Biomechanical lesion, unspecified: Secondary | ICD-10-CM | POA: Diagnosis not present

## 2014-07-02 LAB — POCT INR: INR: 1.4

## 2014-07-02 NOTE — Telephone Encounter (Signed)
We did adjust his coumadin, recheck  2 to 4 weeks, also he will be seen next week for back pain probable MRI and neurosurg referral

## 2014-07-02 NOTE — Patient Instructions (Signed)
Take one half tablet on Friday and one tablet all other days.

## 2014-07-03 ENCOUNTER — Telehealth: Payer: Self-pay | Admitting: Family Medicine

## 2014-07-03 ENCOUNTER — Telehealth: Payer: Self-pay | Admitting: *Deleted

## 2014-07-03 ENCOUNTER — Other Ambulatory Visit: Payer: Self-pay | Admitting: *Deleted

## 2014-07-03 MED ORDER — HYDROCODONE-ACETAMINOPHEN 5-325 MG PO TABS: 1.0000 | ORAL_TABLET | Freq: Four times a day (QID) | ORAL | Status: DC | PRN

## 2014-07-03 NOTE — Telephone Encounter (Signed)
Pt would like a handicap License Plate  See form in red folder, not sure whom  The car is registered in, but if you would sign  It for him to take over to the dmv please   Call pt when ready

## 2014-07-03 NOTE — Telephone Encounter (Signed)
done

## 2014-07-03 NOTE — Telephone Encounter (Signed)
rx ready. Pt notified. Pt has ov next week with Dr. Nicki Reaper

## 2014-07-03 NOTE — Telephone Encounter (Signed)
Requesting refill on hydrocodone. Last seen 08/10. Called pharm. Last filled #60 on 08/11. Pt states he has one pill left. Consult with Dr. Richardson Landry. Give #20 and office visit with Dr. Nicki Reaper next week.

## 2014-07-07 ENCOUNTER — Ambulatory Visit: Payer: Medicare Other | Admitting: Family Medicine

## 2014-07-09 ENCOUNTER — Ambulatory Visit (INDEPENDENT_AMBULATORY_CARE_PROVIDER_SITE_OTHER): Payer: Medicare Other | Admitting: Family Medicine

## 2014-07-09 ENCOUNTER — Encounter: Payer: Self-pay | Admitting: Family Medicine

## 2014-07-09 VITALS — BP 122/68 | Wt 260.0 lb

## 2014-07-09 DIAGNOSIS — M5430 Sciatica, unspecified side: Secondary | ICD-10-CM | POA: Diagnosis not present

## 2014-07-09 DIAGNOSIS — M545 Low back pain, unspecified: Secondary | ICD-10-CM | POA: Diagnosis not present

## 2014-07-09 DIAGNOSIS — E0849 Diabetes mellitus due to underlying condition with other diabetic neurological complication: Secondary | ICD-10-CM

## 2014-07-09 DIAGNOSIS — M5431 Sciatica, right side: Secondary | ICD-10-CM

## 2014-07-09 DIAGNOSIS — E1349 Other specified diabetes mellitus with other diabetic neurological complication: Secondary | ICD-10-CM

## 2014-07-09 MED ORDER — HYDROCODONE-ACETAMINOPHEN 5-325 MG PO TABS: ORAL_TABLET | ORAL | Status: DC

## 2014-07-09 NOTE — Progress Notes (Signed)
   Subjective:    Patient ID: [name] [name], male    DOB: 1932/04/22, 80 y.o.   MRN: [id number]  HPIFollow up Back pain.   Bilateral foot pain.Taking neurotin 300mg  BID Long discussion held with patient he states he has lower back pain discomfort radiates into the right leg and progressive over the past 6 months and longer than that in present but worse over the past 6 months 2 point walk only a couple 100 feet without sitting he states it's really limited his lifestyle despite using pain medicine. He also states burning in both feet has been progressive ever since. He denies. Has tried admission assess tried Tylenol success also try to stretch her he denies loss of bowel or bladder he denies fever chills he does state the pain wakes him up at night Review of Systems    Seema Objective:   Physical Exam Lumbar pain increased pain with raising of the right leg patient also with neuropathy in both. Patient has difficult time standing he walks slowly. Lungs are clear heart irregular rate controlled extremities no significant edema       Assessment & Plan:  #1 severe lumbar pain and discomfort with sciatica. This is been going on for well over a year but has been progressive over the past 6 months causing him great her problems I recommend that this patient go forward with MRI testing of the lumbar spine. I recommended that he followup one week after the MRI to discuss. I suspect possibility of a foraminal stenosis or herniated disc because of the increased pain and discomfort being debilitating and presents for greater than 6 months despite Tylenol stretching and conservative measures he would benefit from an MRI  #2 pain control hydrocodone no greater than 3 times per day prescription given. Cautioned regarding overusage  #3 I saw the patient at length about keeping his medications he saw he states that somebody had gone through his medicine so he doesn't know who it was.  #4 neuropathy of the  feet continue Neurontin 300 mg twice a day stop ltram  Greater than 25 minutes this patient going over his findings on today's exam are every morning plus swelling over his pain medicine control

## 2014-07-15 ENCOUNTER — Ambulatory Visit (HOSPITAL_COMMUNITY)
Admission: RE | Admit: 2014-07-15 | Discharge: 2014-07-15 | Disposition: A | Discharge status: Home / Self Care | Payer: Medicare Other | Source: Ambulatory Visit | Attending: Family Medicine | Admitting: Family Medicine

## 2014-07-15 ENCOUNTER — Other Ambulatory Visit: Payer: Self-pay | Admitting: *Deleted

## 2014-07-15 ENCOUNTER — Telehealth: Payer: Self-pay | Admitting: Family Medicine

## 2014-07-15 DIAGNOSIS — M899 Disorder of bone, unspecified: Secondary | ICD-10-CM | POA: Diagnosis not present

## 2014-07-15 DIAGNOSIS — M48061 Spinal stenosis, lumbar region without neurogenic claudication: Secondary | ICD-10-CM | POA: Diagnosis not present

## 2014-07-15 DIAGNOSIS — M545 Low back pain, unspecified: Secondary | ICD-10-CM | POA: Diagnosis not present

## 2014-07-15 DIAGNOSIS — M949 Disorder of cartilage, unspecified: Secondary | ICD-10-CM

## 2014-07-15 MED ORDER — ZOLPIDEM TARTRATE 10 MG PO TABS: 10.0000 mg | ORAL_TABLET | Freq: Every evening | ORAL | Status: DC | PRN

## 2014-07-15 NOTE — Telephone Encounter (Signed)
Patient needs Rx for zolpidem (AMBIEN) 10 MG tablet to [facility].

## 2014-07-17 NOTE — Telephone Encounter (Signed)
Done

## 2014-07-23 ENCOUNTER — Other Ambulatory Visit: Payer: Self-pay | Admitting: Family Medicine

## 2014-07-29 ENCOUNTER — Encounter: Payer: Self-pay | Admitting: Family Medicine

## 2014-07-29 ENCOUNTER — Ambulatory Visit (INDEPENDENT_AMBULATORY_CARE_PROVIDER_SITE_OTHER): Payer: Medicare Other | Admitting: Family Medicine

## 2014-07-29 VITALS — BP 124/80 | Wt 258.0 lb

## 2014-07-29 DIAGNOSIS — Z23 Encounter for immunization: Secondary | ICD-10-CM

## 2014-07-29 DIAGNOSIS — G909 Disorder of the autonomic nervous system, unspecified: Secondary | ICD-10-CM

## 2014-07-29 DIAGNOSIS — E119 Type 2 diabetes mellitus without complications: Secondary | ICD-10-CM

## 2014-07-29 DIAGNOSIS — M549 Dorsalgia, unspecified: Secondary | ICD-10-CM

## 2014-07-29 DIAGNOSIS — I10 Essential (primary) hypertension: Secondary | ICD-10-CM | POA: Diagnosis not present

## 2014-07-29 DIAGNOSIS — E1143 Type 2 diabetes mellitus with diabetic autonomic (poly)neuropathy: Secondary | ICD-10-CM

## 2014-07-29 DIAGNOSIS — E1149 Type 2 diabetes mellitus with other diabetic neurological complication: Secondary | ICD-10-CM

## 2014-07-29 DIAGNOSIS — Z7901 Long term (current) use of anticoagulants: Secondary | ICD-10-CM

## 2014-07-29 DIAGNOSIS — E785 Hyperlipidemia, unspecified: Secondary | ICD-10-CM

## 2014-07-29 DIAGNOSIS — G8929 Other chronic pain: Secondary | ICD-10-CM

## 2014-07-29 LAB — POCT INR: INR: 2.3

## 2014-07-29 MED ORDER — HYDROCODONE-ACETAMINOPHEN 5-325 MG PO TABS: ORAL_TABLET | ORAL | Status: DC

## 2014-07-29 MED ORDER — FUROSEMIDE 20 MG PO TABS: ORAL_TABLET | ORAL | Status: DC

## 2014-07-29 NOTE — Patient Instructions (Signed)
Coumadin take one tablet all days except on Friday take one half tablet. Recheck INR in 4 weeks and office visit with DR. Scott in 3 months.

## 2014-07-29 NOTE — Progress Notes (Signed)
   Subjective:    Patient ID: [name] [name], male    DOB: September 26, 1932, 80 y.o.   MRN: [id number]  HPIFollow up on back pain. Has prescription for tramadol and hydrocodone. Pt has only been taking hydrocodone.   Balance is off. Feels dizzy when turning head or getting up too fast. Headache and nausea. Started 6 months to 1 year ago.   INR today 2.3.  Flu vaccine given today.  Review of Systems     Objective:   Physical Exam Lungs are clear heart rate controlled heart irregular abdomen soft extremities trace edema subjective lumbar pain       Assessment & Plan:  Low back pain-stop tramadol Intermittent orthostatic hypotension slight drop in blood pressure reduce Lasix to half a tablet each morning followup patient in 12 weeksllowup sooner problems Low back pain is not surgical. Continue on with Neurontin. Use hydrocodone when necessary.  Under a lot of stress.  1. Need for prophylactic vaccination and inoculation against influenza Given today  2. Essential hypertension Good control check kidney functions - Basic metabolic panel  3. Type 2 diabetes mellitus without complication Has been a long time since he is checked this we will check A1c watch starches and diet - Hemoglobin A1c  4. Diabetic autonomic neuropathy associated with type 2 diabetes mellitus Continue Neurontin  5. Hyperlipidemia Has not been able to tolerate statins - Lipid panel  6. Chronic back pain Use hydrocodone as necessary for pain caution drowsiness stop tramadol  7. Long term (current) use of anticoagulants Nurse was writing down his current dosage followup every 4 weeks office visit 3 months - POCT INR

## 2014-08-01 ENCOUNTER — Ambulatory Visit: Payer: Medicare Other | Admitting: Family Medicine

## 2014-08-01 DIAGNOSIS — I10 Essential (primary) hypertension: Secondary | ICD-10-CM | POA: Diagnosis not present

## 2014-08-01 DIAGNOSIS — E785 Hyperlipidemia, unspecified: Secondary | ICD-10-CM | POA: Diagnosis not present

## 2014-08-01 DIAGNOSIS — E119 Type 2 diabetes mellitus without complications: Secondary | ICD-10-CM | POA: Diagnosis not present

## 2014-08-02 LAB — BASIC METABOLIC PANEL
BUN: 15 mg/dL (ref 6–23)
CHLORIDE: 101 meq/L (ref 96–112)
CO2: 29 meq/L (ref 19–32)
CREATININE: 0.99 mg/dL (ref 0.50–1.35)
Calcium: 8.7 mg/dL (ref 8.4–10.5)
GLUCOSE: 196 mg/dL — AB (ref 70–99)
Potassium: 4.9 mEq/L (ref 3.5–5.3)
Sodium: 139 mEq/L (ref 135–145)

## 2014-08-02 LAB — LIPID PANEL
CHOLESTEROL: 211 mg/dL — AB (ref 0–200)
HDL: 36 mg/dL — ABNORMAL LOW (ref >39)
LDL CALC: 126 mg/dL — AB (ref 0–99)
TRIGLYCERIDES: 245 mg/dL — AB (ref <150)
Total CHOL/HDL Ratio: 5.9 Ratio
VLDL: 49 mg/dL — ABNORMAL HIGH (ref 0–40)

## 2014-08-02 LAB — HEMOGLOBIN A1C
Hgb A1c MFr Bld: 8.8 % — ABNORMAL HIGH (ref <5.7)
Mean Plasma Glucose: 206 mg/dL — ABNORMAL HIGH (ref <117)

## 2014-08-05 ENCOUNTER — Other Ambulatory Visit: Payer: Self-pay | Admitting: Family Medicine

## 2014-08-20 ENCOUNTER — Other Ambulatory Visit: Payer: Self-pay | Admitting: Family Medicine

## 2014-08-26 ENCOUNTER — Ambulatory Visit (INDEPENDENT_AMBULATORY_CARE_PROVIDER_SITE_OTHER): Payer: Medicare Other | Admitting: *Deleted

## 2014-08-26 ENCOUNTER — Telehealth: Payer: Self-pay | Admitting: *Deleted

## 2014-08-26 ENCOUNTER — Other Ambulatory Visit: Payer: Self-pay | Admitting: *Deleted

## 2014-08-26 DIAGNOSIS — Z7901 Long term (current) use of anticoagulants: Secondary | ICD-10-CM | POA: Diagnosis not present

## 2014-08-26 DIAGNOSIS — Z23 Encounter for immunization: Secondary | ICD-10-CM

## 2014-08-26 LAB — POCT INR: INR: 2.7

## 2014-08-26 MED ORDER — CITALOPRAM HYDROBROMIDE 10 MG PO TABS: 10.0000 mg | ORAL_TABLET | Freq: Every day | ORAL | Status: DC

## 2014-08-26 MED ORDER — POTASSIUM CHLORIDE CRYS ER 10 MEQ PO TBCR: EXTENDED_RELEASE_TABLET | ORAL | Status: DC

## 2014-08-26 NOTE — Telephone Encounter (Signed)
Pt dropped off blood sugar readings. He is taking glipizide 5mg  one qam. Has appt for follow up in one month.

## 2014-08-26 NOTE — Patient Instructions (Signed)
COUMADIN 5MG  TABLETS. TAKE ONE HALF ON Friday AND ONE TABLET ALL OTHER DAYS. RECHECK INR IN ONE MONTH WITH OFFICE VISIT WITH DR. Nicki Reaper.

## 2014-08-27 ENCOUNTER — Other Ambulatory Visit: Payer: Self-pay | Admitting: *Deleted

## 2014-08-27 MED ORDER — GLIPIZIDE 5 MG PO TABS: 5.0000 mg | ORAL_TABLET | Freq: Two times a day (BID) | ORAL | Status: DC

## 2014-08-27 NOTE — Telephone Encounter (Signed)
Recommend changing his medication, use glipizide 5 mg tablet, not extended release. One in the morning one at supper, #60, 6 refills, monitor her sugars like he has been if any low spells and notify us followup in one month as planned (his fasting sugars are running between 158 and 186 glucose is later in the day are in the 160s in near bedtime are in the 140s)

## 2014-08-27 NOTE — Telephone Encounter (Signed)
[facility] - med has been sent to pharm.

## 2014-08-28 ENCOUNTER — Other Ambulatory Visit: Payer: Self-pay | Admitting: *Deleted

## 2014-08-28 MED ORDER — GLIPIZIDE 5 MG PO TABS: 5.0000 mg | ORAL_TABLET | Freq: Two times a day (BID) | ORAL | Status: DC

## 2014-08-28 NOTE — Telephone Encounter (Signed)
Pt will pick up script tomorrow and wants to speak with a nurse about script. Pt was at [place] when i called. His son just passed away and states he will come tomorrow to discuss new med and stopping the extended release.

## 2014-08-28 NOTE — Telephone Encounter (Signed)
rx up front 

## 2014-09-01 NOTE — Telephone Encounter (Signed)
Discussed with Patient. Patient verbalized understanding and wanted med faxed to [facility] for delivery. Patient advised multiple times to make sure he does not take med from his old bottle and the new bottle. Patient also had me explain this to their home health assistant who also verbalized understanding.

## 2014-09-26 ENCOUNTER — Encounter: Payer: Self-pay | Admitting: Family Medicine

## 2014-09-26 ENCOUNTER — Ambulatory Visit (INDEPENDENT_AMBULATORY_CARE_PROVIDER_SITE_OTHER): Payer: Medicare Other | Admitting: Family Medicine

## 2014-09-26 VITALS — BP 104/70 | Ht 73.0 in | Wt 257.2 lb

## 2014-09-26 DIAGNOSIS — E785 Hyperlipidemia, unspecified: Secondary | ICD-10-CM | POA: Diagnosis not present

## 2014-09-26 DIAGNOSIS — R29898 Other symptoms and signs involving the musculoskeletal system: Secondary | ICD-10-CM

## 2014-09-26 DIAGNOSIS — Z7901 Long term (current) use of anticoagulants: Secondary | ICD-10-CM

## 2014-09-26 LAB — POCT INR: INR: 2.8

## 2014-09-26 MED ORDER — METFORMIN HCL 500 MG PO TABS: ORAL_TABLET | ORAL | Status: DC

## 2014-09-26 NOTE — Progress Notes (Signed)
   Subjective:    Patient ID: [name] [name], male    DOB: [birth year], 80 y.o.   MRN: [id number]  Diabetes He presents for his follow-up diabetic visit. He has type 2 diabetes mellitus. His disease course has been stable. There are no hypoglycemic associated symptoms. There are no diabetic associated symptoms. There are no hypoglycemic complications. Symptoms are improving. There are no diabetic complications. There are no known risk factors for coronary artery disease. Current diabetic treatment includes oral agent (dual therapy). He is compliant with treatment all of the time.   Patient had A1C done about 2 months ago.   Patient states he has bilateral leg pain. It has been present for about several years now.   Abnormal MRI proximally 2 months ago 25 minutes was spent with this patient trying to explain his medicines to him and trying to explain how we are going to increase his medication in addition to this time was spent discussing his leg weakness Review of Systems     Objective:   Physical Exam  Lungs are clear hearts regular pulse irregular but rate controlled extremities no edema skin warm dry  Patient does have difficulty getting out of a chair seems to have more proximal muscle weakness    Assessment & Plan:  #1 diabetes subpar add metformin half tablet twice daily  Hypertension heart disease stable  Leg weakness I cannot rule out the possibility of polymyalgia rheumatica. I cannot rule out the possibility of nerve impingement in his back. I don't feel this patient is a good surgical candidate. We will refer him to neurology for further evaluation.  I also believe that this patient is developing dementia have asked for him to come back within a few weeks with his family member for further discussion.

## 2014-09-26 NOTE — Patient Instructions (Signed)
COUMADIN 5MG . TAKE ONE TABLET EVERY DAY EXCEPT ONE HALF ON FRIDAY. RECHECK IN 4 WEEKS.

## 2014-09-27 LAB — CK: Total CK: 54 U/L (ref 7–232)

## 2014-09-27 LAB — SEDIMENTATION RATE: SED RATE: 9 mm/h (ref 0–16)

## 2014-10-17 ENCOUNTER — Ambulatory Visit (INDEPENDENT_AMBULATORY_CARE_PROVIDER_SITE_OTHER): Payer: Medicare Other | Admitting: Family Medicine

## 2014-10-17 ENCOUNTER — Encounter: Payer: Self-pay | Admitting: Family Medicine

## 2014-10-17 VITALS — BP 132/80 | Wt 257.0 lb

## 2014-10-17 DIAGNOSIS — M549 Dorsalgia, unspecified: Secondary | ICD-10-CM

## 2014-10-17 DIAGNOSIS — I10 Essential (primary) hypertension: Secondary | ICD-10-CM

## 2014-10-17 DIAGNOSIS — E118 Type 2 diabetes mellitus with unspecified complications: Secondary | ICD-10-CM | POA: Diagnosis not present

## 2014-10-17 DIAGNOSIS — I481 Persistent atrial fibrillation: Secondary | ICD-10-CM | POA: Diagnosis not present

## 2014-10-17 DIAGNOSIS — E0842 Diabetes mellitus due to underlying condition with diabetic polyneuropathy: Secondary | ICD-10-CM | POA: Diagnosis not present

## 2014-10-17 DIAGNOSIS — Z7901 Long term (current) use of anticoagulants: Secondary | ICD-10-CM | POA: Diagnosis not present

## 2014-10-17 DIAGNOSIS — G8929 Other chronic pain: Secondary | ICD-10-CM

## 2014-10-17 DIAGNOSIS — I4819 Other persistent atrial fibrillation: Secondary | ICD-10-CM

## 2014-10-17 LAB — POCT INR: INR: 2.1

## 2014-10-17 NOTE — Progress Notes (Signed)
   Subjective:    Patient ID: [name] [name], male    DOB: 10-01-1932, 80 y.o.   MRN: [id number]  HPIwanting rx for a lift chair. Having bilateral foot pain and burning. Back pain when walking.  Fatigue. No energy. Sleeps a lot.  INR today 2.1 25 minutes discussing issues Lift chair script given for weakness  Review of Systems  Constitutional: Negative for activity change, appetite change and fatigue.  HENT: Negative for congestion.   Respiratory: Negative for cough.   Cardiovascular: Negative for chest pain.  Gastrointestinal: Negative for vomiting, abdominal pain and diarrhea.  Endocrine: Negative for polydipsia and polyphagia.  Musculoskeletal: Positive for back pain and arthralgias. Negative for myalgias and gait problem.  Neurological: Negative for weakness.  Psychiatric/Behavioral: Negative for confusion.       Objective:   Physical Exam  Constitutional: He appears well-nourished. No distress.  Cardiovascular: Normal rate and normal heart sounds.   No murmur heard. Pulmonary/Chest: Effort normal and breath sounds normal. No respiratory distress.  Abdominal: Soft. He exhibits no distension.  Musculoskeletal: He exhibits no edema.  Lymphadenopathy:    He has no cervical adenopathy.  Neurological: He is alert.  Psychiatric: His behavior is normal.  Vitals reviewed.         Assessment & Plan:  Essential hypertension  Long term current use of anticoagulant therapy - Plan: POCT INR, POCT INR, POCT INR  Persistent atrial fibrillation  Type 2 diabetes mellitus with complication  Diabetic polyneuropathy associated with diabetes mellitus due to underlying condition  Chronic back pain  Reviewed overr all meds, discussed in detail his illnesses with him and POA [name] [name] Local day time driving only Some cognitive dysfunxction but not alzheimers

## 2014-10-24 ENCOUNTER — Ambulatory Visit: Payer: Medicare Other

## 2014-10-28 ENCOUNTER — Other Ambulatory Visit: Payer: Self-pay | Admitting: Family Medicine

## 2014-11-13 ENCOUNTER — Encounter: Payer: Self-pay | Admitting: Family Medicine

## 2014-11-13 ENCOUNTER — Ambulatory Visit (INDEPENDENT_AMBULATORY_CARE_PROVIDER_SITE_OTHER): Payer: Medicare Other | Admitting: Family Medicine

## 2014-11-13 VITALS — BP 130/84 | Ht 73.0 in | Wt 256.0 lb

## 2014-11-13 DIAGNOSIS — E119 Type 2 diabetes mellitus without complications: Secondary | ICD-10-CM

## 2014-11-13 DIAGNOSIS — Z7901 Long term (current) use of anticoagulants: Secondary | ICD-10-CM | POA: Diagnosis not present

## 2014-11-13 LAB — POCT GLYCOSYLATED HEMOGLOBIN (HGB A1C): HEMOGLOBIN A1C: 7.4

## 2014-11-13 LAB — POCT INR: INR: 2

## 2014-11-13 NOTE — Progress Notes (Signed)
   Subjective:    Patient ID: [name] [name], male    DOB: [birth year], 80 y.o.   MRN: [id number]  Diabetes He presents for his follow-up diabetic visit. He has type 2 diabetes mellitus. His breakfast blood glucose range is generally 110-130 mg/dl. He sees a podiatrist (Dr. Berline Lopes. but has not been in awhile).Eye exam is current.  A1C 7.4.  Pt states he want to cancel appt with sleep specialist. He states he takes naps during the day and that's the reason he is not sleeping good at night.    Review of Systems Patient denies any chest tightness pressure pain shortness of breath    Objective:   Physical Exam Lungs are clear heart regular pulse normal abdomen soft extremities no edema skin warm dry       Assessment & Plan:  Patient will be seeing eye specialist next month  Diabetes under better control we reinforced proper eating a proper taking a medicine  INR stable recheck this again in one month  Recommend follow-up again in 3 months time for office visit

## 2014-11-19 DIAGNOSIS — H2513 Age-related nuclear cataract, bilateral: Secondary | ICD-10-CM | POA: Diagnosis not present

## 2014-11-24 ENCOUNTER — Other Ambulatory Visit: Payer: Self-pay | Admitting: Family Medicine

## 2014-12-11 ENCOUNTER — Ambulatory Visit (INDEPENDENT_AMBULATORY_CARE_PROVIDER_SITE_OTHER): Payer: Medicare Other

## 2014-12-11 DIAGNOSIS — Z7901 Long term (current) use of anticoagulants: Secondary | ICD-10-CM

## 2014-12-11 LAB — POCT INR: INR: 2.6

## 2015-01-09 ENCOUNTER — Ambulatory Visit (INDEPENDENT_AMBULATORY_CARE_PROVIDER_SITE_OTHER): Payer: Medicare Other | Admitting: *Deleted

## 2015-01-09 DIAGNOSIS — Z7901 Long term (current) use of anticoagulants: Secondary | ICD-10-CM | POA: Diagnosis not present

## 2015-01-09 LAB — POCT INR: INR: 3.4

## 2015-01-09 NOTE — Patient Instructions (Signed)
DO NOT TAKE A TABLET TOMORROW (SATURDAY 2/27) Take one half on Mondays and Fridays and one tablet all other days. Recheck with OV in 3 weeks.

## 2015-01-26 ENCOUNTER — Other Ambulatory Visit: Payer: Self-pay | Admitting: Family Medicine

## 2015-01-30 ENCOUNTER — Ambulatory Visit (INDEPENDENT_AMBULATORY_CARE_PROVIDER_SITE_OTHER): Payer: Medicare Other | Admitting: Family Medicine

## 2015-01-30 ENCOUNTER — Encounter: Payer: Self-pay | Admitting: Family Medicine

## 2015-01-30 VITALS — BP 122/68 | Ht 73.0 in | Wt 257.0 lb

## 2015-01-30 DIAGNOSIS — R51 Headache: Secondary | ICD-10-CM

## 2015-01-30 DIAGNOSIS — G8929 Other chronic pain: Secondary | ICD-10-CM

## 2015-01-30 DIAGNOSIS — Z7901 Long term (current) use of anticoagulants: Secondary | ICD-10-CM | POA: Diagnosis not present

## 2015-01-30 DIAGNOSIS — I10 Essential (primary) hypertension: Secondary | ICD-10-CM | POA: Diagnosis not present

## 2015-01-30 DIAGNOSIS — E785 Hyperlipidemia, unspecified: Secondary | ICD-10-CM | POA: Diagnosis not present

## 2015-01-30 DIAGNOSIS — E119 Type 2 diabetes mellitus without complications: Secondary | ICD-10-CM | POA: Diagnosis not present

## 2015-01-30 DIAGNOSIS — M549 Dorsalgia, unspecified: Secondary | ICD-10-CM

## 2015-01-30 DIAGNOSIS — R519 Headache, unspecified: Secondary | ICD-10-CM

## 2015-01-30 LAB — POCT INR: INR: 2.3

## 2015-01-30 MED ORDER — AMOXICILLIN 500 MG PO TABS: 500.0000 mg | ORAL_TABLET | Freq: Three times a day (TID) | ORAL | Status: DC

## 2015-01-30 MED ORDER — FUROSEMIDE 20 MG PO TABS: ORAL_TABLET | ORAL | Status: DC

## 2015-01-30 MED ORDER — POTASSIUM CHLORIDE CRYS ER 10 MEQ PO TBCR: EXTENDED_RELEASE_TABLET | ORAL | Status: DC

## 2015-01-30 MED ORDER — METFORMIN HCL 500 MG PO TABS: ORAL_TABLET | ORAL | Status: DC

## 2015-01-30 MED ORDER — TRAZODONE HCL 50 MG PO TABS: ORAL_TABLET | ORAL | Status: DC

## 2015-01-30 MED ORDER — METOPROLOL SUCCINATE ER 25 MG PO TB24: ORAL_TABLET | ORAL | Status: DC

## 2015-01-30 NOTE — Progress Notes (Signed)
   Subjective:    Patient ID: [name] [name], male    DOB: Oct 30, 1932, 81 y.o.   MRN: [id number]  HPI Patient arrives for a follow up on meds. Patient reports flare of back pain and pain in face across eyes to ears. INR 2.3 Multiple different issues First sinus pressure congestion drainage coughing symptoms over the past week intermittent temporal headaches for the past several weeks Patient with chronic low back pain worse on the right side is had a MRI in the past nonsurgical has spinal stenosis along with osteoarthritis and bone spurs patient takes hydrocodone occasionally and otherwise Tylenol Patient denies bleeding issues Patient states difficult time sleeping at night but he does take long naps during the day Patient relates his sugars are been doing good. It is too early for his A1c. Review of Systems Complains of head congestion drainage coughing complains of back pain denies abdominal pain denies nausea vomiting diarrhea. Denies bleeding in stool.    Objective:   Physical Exam Mild sinus tenderness to percussion temporal area not much tenderness ears are normal throat is normal neck no masses lungs clear heart irregular rate controlled abdomen obese extremities no edema       Assessment & Plan:  1. Long term current use of anticoagulant therapy Stick with the current dose INR looks good no signs of bleeding check CBC though because of using anticoagulants - POCT INR - CBC with Differential/Platelet  2. Chronic back pain Back pain this is a nonoperable condition to do stretching gentle walking live with it the best he can Tylenol when necessary pain medication when absolute necessary  3. Essential hypertension Blood pressure overall doing well. Keep as is - CBC with Differential/Platelet  4. Type 2 diabetes mellitus without complication N0I doing well. Keep as is. Check A1c coming up. - Hepatic function panel - Basic metabolic panel - Hemoglobin A1c - Microalbumin,  urine  5. Hyperlipidemia Check lipid profile await results - Lipid panel  6. Headache, temporal Check sedimentation rate. Rule out temporal arteritis. - CBC with Differential/Platelet - Sedimentation rate Treat sinus infection with amoxicillin 25 minutes spent patient greater than half was spent in discussion of multiple issues

## 2015-02-16 DIAGNOSIS — R51 Headache: Secondary | ICD-10-CM | POA: Diagnosis not present

## 2015-02-16 DIAGNOSIS — I10 Essential (primary) hypertension: Secondary | ICD-10-CM | POA: Diagnosis not present

## 2015-02-16 DIAGNOSIS — E785 Hyperlipidemia, unspecified: Secondary | ICD-10-CM | POA: Diagnosis not present

## 2015-02-16 DIAGNOSIS — Z7901 Long term (current) use of anticoagulants: Secondary | ICD-10-CM | POA: Diagnosis not present

## 2015-02-16 DIAGNOSIS — E119 Type 2 diabetes mellitus without complications: Secondary | ICD-10-CM | POA: Diagnosis not present

## 2015-02-17 LAB — BASIC METABOLIC PANEL
BUN/Creatinine Ratio: 8 — ABNORMAL LOW (ref 10–22)
BUN: 8 mg/dL (ref 8–27)
CALCIUM: 9.4 mg/dL (ref 8.6–10.2)
CO2: 23 mmol/L (ref 18–29)
CREATININE: 1.04 mg/dL (ref 0.76–1.27)
Chloride: 95 mmol/L — ABNORMAL LOW (ref 97–108)
GFR calc Af Amer: 78 mL/min/{1.73_m2} (ref >59)
GFR, EST NON AFRICAN AMERICAN: 67 mL/min/{1.73_m2} (ref >59)
Glucose: 207 mg/dL — ABNORMAL HIGH (ref 65–99)
POTASSIUM: 4.1 mmol/L (ref 3.5–5.2)
SODIUM: 137 mmol/L (ref 134–144)

## 2015-02-17 LAB — LIPID PANEL
Chol/HDL Ratio: 7 ratio units — ABNORMAL HIGH (ref 0.0–5.0)
Cholesterol, Total: 288 mg/dL — ABNORMAL HIGH (ref 100–199)
HDL: 41 mg/dL (ref >39)
LDL Calculated: 201 mg/dL — ABNORMAL HIGH (ref 0–99)
Triglycerides: 228 mg/dL — ABNORMAL HIGH (ref 0–149)
VLDL Cholesterol Cal: 46 mg/dL — ABNORMAL HIGH (ref 5–40)

## 2015-02-17 LAB — CBC WITH DIFFERENTIAL/PLATELET
BASOS: 1 %
Basophils Absolute: 0.1 10*3/uL (ref 0.0–0.2)
Eos: 3 %
Eosinophils Absolute: 0.2 10*3/uL (ref 0.0–0.4)
HEMATOCRIT: 48.4 % (ref 37.5–51.0)
Hemoglobin: 16.6 g/dL (ref 12.6–17.7)
Immature Grans (Abs): 0 10*3/uL (ref 0.0–0.1)
Immature Granulocytes: 0 %
LYMPHS ABS: 0.8 10*3/uL (ref 0.7–3.1)
Lymphs: 13 %
MCH: 30 pg (ref 26.6–33.0)
MCHC: 34.3 g/dL (ref 31.5–35.7)
MCV: 87 fL (ref 79–97)
MONOCYTES: 4 %
Monocytes Absolute: 0.3 10*3/uL (ref 0.1–0.9)
NEUTROS ABS: 4.7 10*3/uL (ref 1.4–7.0)
Neutrophils Relative %: 79 %
Platelets: 214 10*3/uL (ref 150–379)
RBC: 5.54 x10E6/uL (ref 4.14–5.80)
RDW: 14.4 % (ref 12.3–15.4)
WBC: 6 10*3/uL (ref 3.4–10.8)

## 2015-02-17 LAB — HEPATIC FUNCTION PANEL
ALBUMIN: 4.7 g/dL (ref 3.5–4.7)
ALK PHOS: 87 IU/L (ref 39–117)
ALT: 25 IU/L (ref 0–44)
AST: 21 IU/L (ref 0–40)
BILIRUBIN TOTAL: 0.9 mg/dL (ref 0.0–1.2)
BILIRUBIN, DIRECT: 0.18 mg/dL (ref 0.00–0.40)
Total Protein: 7.8 g/dL (ref 6.0–8.5)

## 2015-02-17 LAB — SEDIMENTATION RATE: Sed Rate: 2 mm/hr (ref 0–30)

## 2015-02-17 LAB — HEMOGLOBIN A1C
Est. average glucose Bld gHb Est-mCnc: 194 mg/dL
Hgb A1c MFr Bld: 8.4 % — ABNORMAL HIGH (ref 4.8–5.6)

## 2015-02-17 LAB — MICROALBUMIN, URINE: Microalbumin, Urine: 113.4 ug/mL — ABNORMAL HIGH (ref 0.0–17.0)

## 2015-02-27 ENCOUNTER — Other Ambulatory Visit: Payer: Self-pay | Admitting: *Deleted

## 2015-03-01 NOTE — Telephone Encounter (Signed)
Reduce the amount to 45 tablets, 1 tid prn ,use sparingly

## 2015-03-02 ENCOUNTER — Encounter: Payer: Self-pay | Admitting: Family Medicine

## 2015-03-02 ENCOUNTER — Ambulatory Visit (INDEPENDENT_AMBULATORY_CARE_PROVIDER_SITE_OTHER): Payer: Medicare Other | Admitting: Family Medicine

## 2015-03-02 VITALS — BP 122/68 | Ht 73.0 in | Wt 257.0 lb

## 2015-03-02 DIAGNOSIS — G8929 Other chronic pain: Secondary | ICD-10-CM

## 2015-03-02 DIAGNOSIS — I481 Persistent atrial fibrillation: Secondary | ICD-10-CM | POA: Diagnosis not present

## 2015-03-02 DIAGNOSIS — I4819 Other persistent atrial fibrillation: Secondary | ICD-10-CM

## 2015-03-02 DIAGNOSIS — E119 Type 2 diabetes mellitus without complications: Secondary | ICD-10-CM | POA: Diagnosis not present

## 2015-03-02 DIAGNOSIS — Z7901 Long term (current) use of anticoagulants: Secondary | ICD-10-CM

## 2015-03-02 DIAGNOSIS — M549 Dorsalgia, unspecified: Secondary | ICD-10-CM | POA: Diagnosis not present

## 2015-03-02 DIAGNOSIS — E785 Hyperlipidemia, unspecified: Secondary | ICD-10-CM

## 2015-03-02 LAB — POCT INR: INR: 2.2

## 2015-03-02 MED ORDER — POTASSIUM CHLORIDE CRYS ER 10 MEQ PO TBCR: EXTENDED_RELEASE_TABLET | ORAL | Status: DC

## 2015-03-02 MED ORDER — HYDROCODONE-ACETAMINOPHEN 5-325 MG PO TABS: 1.0000 | ORAL_TABLET | Freq: Two times a day (BID) | ORAL | Status: DC | PRN

## 2015-03-02 MED ORDER — FUROSEMIDE 20 MG PO TABS: ORAL_TABLET | ORAL | Status: DC

## 2015-03-02 MED ORDER — GLIPIZIDE 5 MG PO TABS: ORAL_TABLET | ORAL | Status: DC

## 2015-03-02 MED ORDER — WARFARIN SODIUM 5 MG PO TABS: 5.0000 mg | ORAL_TABLET | Freq: Every day | ORAL | Status: DC

## 2015-03-02 MED ORDER — GABAPENTIN 300 MG PO CAPS: 300.0000 mg | ORAL_CAPSULE | Freq: Two times a day (BID) | ORAL | Status: DC

## 2015-03-02 MED ORDER — TRAZODONE HCL 50 MG PO TABS: ORAL_TABLET | ORAL | Status: DC

## 2015-03-02 MED ORDER — METFORMIN HCL 500 MG PO TABS: ORAL_TABLET | ORAL | Status: DC

## 2015-03-02 MED ORDER — METOPROLOL SUCCINATE ER 25 MG PO TB24: ORAL_TABLET | ORAL | Status: DC

## 2015-03-02 NOTE — Progress Notes (Signed)
   Subjective:    Patient ID: [name] [name], male    DOB: 07-Oct-1932, 81 y.o.   MRN: [id number]  HPIFollow up on diabetes. Pt forgot to bring in blood sugar readings. He states they have been high. He will drop off readings. a1c on bloodwork 8.4.   INR today 2.2.  Requesting refill on hydrocodone. Having back pain. Requesting 3 month supply.   Pt would like for you to go through his meds. Zolpidem is in his bag but not on med list. Pt states he does take this. Citalopram is also in his bag. Note on trazodone bottle states to stop celexa but he has a bottle of celexa in his bag.  Also has rx for lorazepam in his bag that is not on med list and a pill bottle with different pills in his bottle. Cannot read label.     Review of Systems     Objective:   Physical Exam        Assessment & Plan:

## 2015-03-02 NOTE — Patient Instructions (Addendum)
Diabetes Mellitus and Food It is important for you to manage your blood sugar (glucose) level. Your blood glucose level can be greatly affected by what you eat. Eating healthier foods in the appropriate amounts throughout the day at about the same time each day will help you control your blood glucose level. It can also help slow or prevent worsening of your diabetes mellitus. Healthy eating may even help you improve the level of your blood pressure and reach or maintain a healthy weight.  HOW CAN FOOD AFFECT ME? Carbohydrates Carbohydrates affect your blood glucose level more than any other type of food. Your dietitian will help you determine how many carbohydrates to eat at each meal and teach you how to count carbohydrates. Counting carbohydrates is important to keep your blood glucose at a healthy level, especially if you are using insulin or taking certain medicines for diabetes mellitus. Alcohol Alcohol can cause sudden decreases in blood glucose (hypoglycemia), especially if you use insulin or take certain medicines for diabetes mellitus. Hypoglycemia can be a life-threatening condition. Symptoms of hypoglycemia (sleepiness, dizziness, and disorientation) are similar to symptoms of having too much alcohol.  If your health care provider has given you approval to drink alcohol, do so in moderation and use the following guidelines:  Women should not have more than one drink per day, and men should not have more than two drinks per day. One drink is equal to:  12 oz of beer.  5 oz of wine.  1 oz of hard liquor.  Do not drink on an empty stomach.  Keep yourself hydrated. Have water, diet soda, or unsweetened iced tea.  Regular soda, juice, and other mixers might contain a lot of carbohydrates and should be counted. WHAT FOODS ARE NOT RECOMMENDED? As you make food choices, it is important to remember that all foods are not the same. Some foods have fewer nutrients per serving than other  foods, even though they might have the same number of calories or carbohydrates. It is difficult to get your body what it needs when you eat foods with fewer nutrients. Examples of foods that you should avoid that are high in calories and carbohydrates but low in nutrients include:  Trans fats (most processed foods list trans fats on the Nutrition Facts label).  Regular soda.  Juice.  Candy.  Sweets, such as cake, pie, doughnuts, and cookies.  Fried foods. WHAT FOODS CAN I EAT? Have nutrient-rich foods, which will nourish your body and keep you healthy. The food you should eat also will depend on several factors, including:  The calories you need.  The medicines you take.  Your weight.  Your blood glucose level.  Your blood pressure level.  Your cholesterol level. You also should eat a variety of foods, including:  Protein, such as meat, poultry, fish, tofu, nuts, and seeds (lean animal proteins are best).  Fruits.  Vegetables.  Dairy products, such as milk, cheese, and yogurt (low fat is best).  Breads, grains, pasta, cereal, rice, and beans.  Fats such as olive oil, trans fat-free margarine, canola oil, avocado, and olives. DOES EVERYONE WITH DIABETES MELLITUS HAVE THE SAME MEAL PLAN? Because every person with diabetes mellitus is different, there is not one meal plan that works for everyone. It is very important that you meet with a dietitian who will help you create a meal plan that is just right for you. Document Released: 07/28/2005 Document Revised: 11/05/2013 Document Reviewed: 09/27/2013 [employer] Patient Information 2015 [employer]. This   information is not intended to replace advice given to you by your health care provider. Make sure you discuss any questions you have with your health care provider.  COUMADIN 5MG  TABLETS. TAKE ONE EVERY DAY EXCEPT ON Monday AND FRIDAYS TAKE ONE HALF TABLET. RECHECK INR IN 4 WEEKS

## 2015-03-02 NOTE — Progress Notes (Signed)
   Subjective:    Patient ID: [name] [name], male    DOB: Oct 18, 1932, 81 y.o.   MRN: [id number]  HPI Long discussion held with patient regarding multiple issues including diabetes he denies a low sugar spells does consume too much starches  Chronic low back pain discomfort worse ever since a fall off of his tractor last year and a fall in the yard this year MRI in the past did not show anything surgical  Chronic insomnia recently switched medicine trazodone seems to be helping some still uses Ambien when necessary I told him that this is not a good idea based on his age  Has history of heart disease on appropriate medicine blood pressure good  History hyperlipidemia does not tolerate any statins despite numerous tries  [employer] with Him. Review of Systems  Constitutional: Negative for activity change, appetite change and fatigue.  HENT: Negative for congestion.   Respiratory: Negative for cough.   Cardiovascular: Negative for chest pain.  Gastrointestinal: Negative for abdominal pain.  Endocrine: Negative for polydipsia and polyphagia.  Musculoskeletal: Positive for back pain and arthralgias.  Neurological: Negative for weakness.  Psychiatric/Behavioral: Negative for confusion.       Objective:   Physical Exam  Constitutional: He appears well-nourished. No distress.  Cardiovascular: Normal rate and normal heart sounds.   No murmur heard. Pulmonary/Chest: Effort normal and breath sounds normal. No respiratory distress.  Musculoskeletal: He exhibits no edema.  Lymphadenopathy:    He has no cervical adenopathy.  Neurological: He is alert.  Psychiatric: His behavior is normal.  Vitals reviewed.         Assessment & Plan:  Diabetes subpar control patient to do a better job watching diet increase the dose of the medicine glipizide 5 mg tablet in the morning one and a half tablet at suppertime  History hypertension blood pressure good continue current measures  Pedal  edema on diuretic potassium appropriately  Insomnia trazodone at nighttime stop Ambien  Chronic back pain worse with falls that he took last year and one this year hydrocodone no more than 2 per day 3 prescriptions given  Chronic anticoagulant INR looks good continue current measures  Follow-up monthly INR, office visit 3 months [id number]

## 2015-04-01 ENCOUNTER — Ambulatory Visit (INDEPENDENT_AMBULATORY_CARE_PROVIDER_SITE_OTHER): Payer: Medicare Other | Admitting: *Deleted

## 2015-04-01 DIAGNOSIS — Z7901 Long term (current) use of anticoagulants: Secondary | ICD-10-CM | POA: Diagnosis not present

## 2015-04-01 LAB — POCT INR: INR: 2.9

## 2015-04-01 NOTE — Patient Instructions (Signed)
Continue the same on coumadin. Take one tablet every day except on Monday and Friday take one half tablet. Recheck in 4 weeks.

## 2015-04-11 ENCOUNTER — Other Ambulatory Visit: Payer: Self-pay | Admitting: Family Medicine

## 2015-04-29 ENCOUNTER — Ambulatory Visit (INDEPENDENT_AMBULATORY_CARE_PROVIDER_SITE_OTHER): Payer: Medicare Other

## 2015-04-29 DIAGNOSIS — Z7901 Long term (current) use of anticoagulants: Secondary | ICD-10-CM | POA: Diagnosis not present

## 2015-04-29 LAB — POCT INR: INR: 2.6

## 2015-05-29 ENCOUNTER — Encounter: Payer: Self-pay | Admitting: Family Medicine

## 2015-05-29 ENCOUNTER — Ambulatory Visit (INDEPENDENT_AMBULATORY_CARE_PROVIDER_SITE_OTHER): Payer: Medicare Other | Admitting: Family Medicine

## 2015-05-29 VITALS — BP 122/78 | Ht 73.0 in | Wt 250.0 lb

## 2015-05-29 DIAGNOSIS — F09 Unspecified mental disorder due to known physiological condition: Secondary | ICD-10-CM

## 2015-05-29 DIAGNOSIS — E119 Type 2 diabetes mellitus without complications: Secondary | ICD-10-CM | POA: Diagnosis not present

## 2015-05-29 DIAGNOSIS — I10 Essential (primary) hypertension: Secondary | ICD-10-CM

## 2015-05-29 DIAGNOSIS — M549 Dorsalgia, unspecified: Secondary | ICD-10-CM

## 2015-05-29 DIAGNOSIS — G8929 Other chronic pain: Secondary | ICD-10-CM | POA: Diagnosis not present

## 2015-05-29 DIAGNOSIS — M5431 Sciatica, right side: Secondary | ICD-10-CM

## 2015-05-29 DIAGNOSIS — Z7901 Long term (current) use of anticoagulants: Secondary | ICD-10-CM | POA: Diagnosis not present

## 2015-05-29 HISTORY — DX: Unspecified mental disorder due to known physiological condition: F09

## 2015-05-29 LAB — POCT INR: INR: 2.3

## 2015-05-29 MED ORDER — HYDROCODONE-ACETAMINOPHEN 5-325 MG PO TABS: 1.0000 | ORAL_TABLET | Freq: Two times a day (BID) | ORAL | Status: DC | PRN

## 2015-05-29 NOTE — Progress Notes (Signed)
   Subjective:    Patient ID: [name] [name], male    DOB: [birth year], 81 y.o.   MRN: [id number]  Hypertension This is a chronic problem. The current episode started more than 1 year ago. Pertinent negatives include no chest pain. Risk factors for coronary artery disease include dyslipidemia, obesity and male gender. Treatments tried: lasix.   INR 2.3  Feeling dizzy at times this happens when he stands up after sitting for a length of time  Sciatica into the right leg this is been going on but seems to be getting worse. Takes his medicine but relates severe pain  C/o foot pain patient relates bilateral foot pain but worse in the left around the MTP and associated joints. Hurts when he gets to that in the evening hurts to walk on.    Review of Systems  Constitutional: Negative for activity change, appetite change and fatigue.  HENT: Negative for congestion.   Respiratory: Negative for cough.   Cardiovascular: Negative for chest pain.  Gastrointestinal: Negative for abdominal pain.  Endocrine: Negative for polydipsia and polyphagia.  Neurological: Negative for weakness.  Psychiatric/Behavioral: Negative for confusion.       Objective:   Physical Exam  Constitutional: He appears well-nourished. No distress.  Cardiovascular: Normal rate and normal heart sounds.   No murmur heard. Pulmonary/Chest: Effort normal and breath sounds normal. No respiratory distress.  Musculoskeletal: He exhibits no edema.  Lymphadenopathy:    He has no cervical adenopathy.  Neurological: He is alert.  Psychiatric: His behavior is normal.  Vitals reviewed.  The patient has subjective discomfort down his right leg into his foot he also has tenderness metatarsalgia of the left foot  Exceptionally nice patient I went over all of his medicines. Family friend here with him. The patient has a difficult time understanding what is being told to him we wrote instructions down as well    neurologically he is  able to repeat what is said to him but cannot draw clock Assessment & Plan:  1. Long-term (current) use of anticoagulants INR looks good continue current measures these were written down for him he will follow-up in one month - POCT INR  2. Diabetes mellitus without complication He will need to check A1c as well as metabolic function he states sugars have been doing good lately continue medications - Basic metabolic panel - Hemoglobin A1c  3. Chronic back pain The patient has impingement back that's causing radiation of pain down the right leg increase gabapentin follow-up in one month  4. Essential hypertension Blood pressure is been stable but when I checked orthostatics there is considerable drop when patient goes from sitting to standing. Stable from laying to sitting. Reduce metoprolol in half  5. Sciatica associated with disorder of lumbar spine, right Gabapentin as discussed above  6. Cognitive dysfunction I wrote down all of his instructions are he would not get confused regarding his medications greater and 25 minutes spent with the patient greater than half in discussion of these multiple issues

## 2015-05-30 ENCOUNTER — Other Ambulatory Visit: Payer: Self-pay | Admitting: Family Medicine

## 2015-05-30 LAB — BASIC METABOLIC PANEL
BUN/Creatinine Ratio: 13 (ref 10–22)
BUN: 11 mg/dL (ref 8–27)
CALCIUM: 9.5 mg/dL (ref 8.6–10.2)
CO2: 23 mmol/L (ref 18–29)
Chloride: 97 mmol/L (ref 97–108)
Creatinine, Ser: 0.83 mg/dL (ref 0.76–1.27)
GFR calc non Af Amer: 83 mL/min/{1.73_m2} (ref >59)
GFR, EST AFRICAN AMERICAN: 96 mL/min/{1.73_m2} (ref >59)
GLUCOSE: 152 mg/dL — AB (ref 65–99)
POTASSIUM: 4.5 mmol/L (ref 3.5–5.2)
SODIUM: 139 mmol/L (ref 134–144)

## 2015-05-30 LAB — HEMOGLOBIN A1C
Est. average glucose Bld gHb Est-mCnc: 163 mg/dL
Hgb A1c MFr Bld: 7.3 % — ABNORMAL HIGH (ref 4.8–5.6)

## 2015-06-01 ENCOUNTER — Ambulatory Visit: Payer: Medicare Other | Admitting: Family Medicine

## 2015-06-29 ENCOUNTER — Encounter: Payer: Self-pay | Admitting: Family Medicine

## 2015-06-29 ENCOUNTER — Ambulatory Visit (INDEPENDENT_AMBULATORY_CARE_PROVIDER_SITE_OTHER): Payer: Medicare Other | Admitting: Family Medicine

## 2015-06-29 VITALS — BP 132/80 | Ht 73.0 in | Wt 250.0 lb

## 2015-06-29 DIAGNOSIS — Z7901 Long term (current) use of anticoagulants: Secondary | ICD-10-CM

## 2015-06-29 DIAGNOSIS — F09 Unspecified mental disorder due to known physiological condition: Secondary | ICD-10-CM | POA: Diagnosis not present

## 2015-06-29 DIAGNOSIS — E119 Type 2 diabetes mellitus without complications: Secondary | ICD-10-CM

## 2015-06-29 DIAGNOSIS — I10 Essential (primary) hypertension: Secondary | ICD-10-CM | POA: Diagnosis not present

## 2015-06-29 DIAGNOSIS — E0842 Diabetes mellitus due to underlying condition with diabetic polyneuropathy: Secondary | ICD-10-CM | POA: Diagnosis not present

## 2015-06-29 LAB — POCT INR: INR: 1.8

## 2015-06-29 MED ORDER — METFORMIN HCL 500 MG PO TABS: ORAL_TABLET | ORAL | Status: DC

## 2015-06-29 MED ORDER — PAMELOR 10 MG PO CAPS: ORAL_CAPSULE | ORAL | Status: DC

## 2015-06-29 MED ORDER — METOPROLOL SUCCINATE ER 25 MG PO TB24: ORAL_TABLET | ORAL | Status: DC

## 2015-06-29 MED ORDER — NORTRIPTYLINE HCL 10 MG PO CAPS: ORAL_CAPSULE | ORAL | Status: DC

## 2015-06-29 MED ORDER — FUROSEMIDE 20 MG PO TABS: ORAL_TABLET | ORAL | Status: DC

## 2015-06-29 MED ORDER — HYDROCODONE-ACETAMINOPHEN 5-325 MG PO TABS: 1.0000 | ORAL_TABLET | Freq: Two times a day (BID) | ORAL | Status: DC | PRN

## 2015-06-29 MED ORDER — POTASSIUM CHLORIDE ER 10 MEQ PO TBCR: EXTENDED_RELEASE_TABLET | ORAL | Status: DC

## 2015-06-29 MED ORDER — WARFARIN SODIUM 5 MG PO TABS: 5.0000 mg | ORAL_TABLET | Freq: Every day | ORAL | Status: DC

## 2015-06-29 MED ORDER — TRAZODONE HCL 50 MG PO TABS: ORAL_TABLET | ORAL | Status: DC

## 2015-06-29 MED ORDER — GLIPIZIDE 5 MG PO TABS: ORAL_TABLET | ORAL | Status: DC

## 2015-06-29 NOTE — Progress Notes (Signed)
Subjective:    Patient ID: [name] [name], male    DOB: 05/12/1932, 81 y.o.   MRN: [id number]  Diabetes He presents for his follow-up diabetic visit. He has type 2 diabetes mellitus. Pertinent negatives for hypoglycemia include no confusion. Pertinent negatives for diabetes include no chest pain, no fatigue, no polydipsia, no polyphagia and no weakness. He sees a podiatrist.Eye exam is not current.  FBS this am was 150. A1C one month ago was 7.3.  Pt taking glipizide 5mg  one qam and half at supper. Directions on bottle match this but on med list it is one qam and one and a half at supper. Please clarify how pt should take.  Pt has been only taking one half of metoprolol instead of one tablet.  Pt also has only been only taking one potassium instead of two.   Pt stopped taking gabapentin because he thinks it was making him dizzy.   Pt requesting referral to have a cna come out and help him. Patient has difficult time getting up and getting around. Has difficult time with remembering when he should be taking medicines. States he has a very difficult time helping his wife out unable to help change her colostomy bag.  Check left arm. Pt's dog scratched him.   INR today 1.8.   Review of Systems  Constitutional: Negative for activity change, appetite change and fatigue.  HENT: Negative for congestion.   Respiratory: Negative for cough and shortness of breath.   Cardiovascular: Positive for leg swelling. Negative for chest pain.  Gastrointestinal: Negative for abdominal pain.  Endocrine: Negative for polydipsia and polyphagia.  Neurological: Negative for weakness.  Psychiatric/Behavioral: Negative for confusion.       Objective:   Physical Exam  Constitutional: He appears well-nourished. No distress.  Cardiovascular: Normal rate and normal heart sounds.   No murmur heard. Pulmonary/Chest: Effort normal and breath sounds normal. No respiratory distress.  Abdominal: Soft. There is no  tenderness.  Musculoskeletal: He exhibits no edema.  Lymphadenopathy:    He has no cervical adenopathy.  Neurological: He is alert.  Psychiatric: His behavior is normal.  Vitals reviewed.  Left arm has skin tear from where her dog scratched him Minimal ankle edema. No sign of CHF.       Assessment & Plan:  1. Long term current use of anticoagulant therapy The patient's INR is a slight bit lower than what it used to be. I believe that this patient may or may not have been taking his medicines as directed. All the previous readings over the past months have been good. I would recommend sticking with the current dose recheck in one month - POCT INR  2. Diabetes mellitus without complication His G3T on the last visit was good patient slightly confused about his medications was noted he should be doing 1 in the morning and half at supper time. He denies any low sugar spells.  3. Essential hypertension Patient currently doing one half metoprolol daily I think it is fine to continue that.  4. Cognitive dysfunction Patient does have some memory issues and has some reasoning difficulties not severe enough to be classified as dementia but enough to where I recommend that he has help at home in taking care of himself as well as his wife.this patient population of health issues including arthritis cognitive issues as well as complications from peripheral neuropathy vascular issues have made such to where he has a hard time getting around. Has difficult time with fine  motor movements of the hand. he is not capable currently of helping his wife. Currently he has a aide that comes in during the daybut he needs someone they are from approximately 4 PM to 9 PM to help out around the home. This person could make sure that he is taking his medicines correctly help with food preparation and with light household duties  5. Diabetic polyneuropathy associated with diabetes mellitus due to underlying  condition Patient did not tolerate gabapentin. We will go ahead and try nortriptyline 10 mg daily at bedtime 1 for the first week at bedtime if this does not help outenough then increase to 2 tablets if excessive dry mouth or difficulty urinating stop medication  I believe it is in the patient's best interest to recheck in one month with office visit

## 2015-07-02 ENCOUNTER — Encounter: Payer: Self-pay | Admitting: Family Medicine

## 2015-07-02 ENCOUNTER — Telehealth: Payer: Self-pay | Admitting: Family Medicine

## 2015-07-02 NOTE — Telephone Encounter (Signed)
Patient says that Dr. Nicki Reaper was supposed to have a paper for patient to take to his insurance ready today.  I don't see anything up front.  Please advise.

## 2015-07-02 NOTE — Telephone Encounter (Signed)
I routed a letter to [employer], thank you

## 2015-07-02 NOTE — Telephone Encounter (Signed)
Notified patient to come pick up letter.

## 2015-07-03 ENCOUNTER — Other Ambulatory Visit: Payer: Self-pay | Admitting: Family Medicine

## 2015-07-08 ENCOUNTER — Other Ambulatory Visit: Payer: Self-pay | Admitting: *Deleted

## 2015-07-08 MED ORDER — GLIPIZIDE 5 MG PO TABS: ORAL_TABLET | ORAL | Status: DC

## 2015-07-16 ENCOUNTER — Other Ambulatory Visit: Payer: Self-pay | Admitting: Family Medicine

## 2015-07-28 ENCOUNTER — Ambulatory Visit (INDEPENDENT_AMBULATORY_CARE_PROVIDER_SITE_OTHER): Payer: Medicare Other

## 2015-07-28 DIAGNOSIS — Z23 Encounter for immunization: Secondary | ICD-10-CM

## 2015-07-28 DIAGNOSIS — Z7901 Long term (current) use of anticoagulants: Secondary | ICD-10-CM | POA: Diagnosis not present

## 2015-07-28 LAB — POCT INR: INR: 2.2

## 2015-08-20 ENCOUNTER — Emergency Department (HOSPITAL_COMMUNITY): Payer: Medicare Other

## 2015-08-20 ENCOUNTER — Observation Stay (HOSPITAL_COMMUNITY)
Admission: EM | Admit: 2015-08-20 | Discharge: 2015-08-21 | Disposition: A | Discharge status: Home / Self Care | Payer: Medicare Other | Attending: Internal Medicine | Admitting: Internal Medicine

## 2015-08-20 ENCOUNTER — Ambulatory Visit (INDEPENDENT_AMBULATORY_CARE_PROVIDER_SITE_OTHER): Payer: Medicare Other | Admitting: Family Medicine

## 2015-08-20 ENCOUNTER — Encounter (HOSPITAL_COMMUNITY): Payer: Self-pay | Admitting: Emergency Medicine

## 2015-08-20 ENCOUNTER — Encounter: Payer: Self-pay | Admitting: Family Medicine

## 2015-08-20 VITALS — Temp 97.8°F | Ht 73.0 in | Wt 241.4 lb

## 2015-08-20 DIAGNOSIS — E119 Type 2 diabetes mellitus without complications: Secondary | ICD-10-CM

## 2015-08-20 DIAGNOSIS — J45901 Unspecified asthma with (acute) exacerbation: Secondary | ICD-10-CM

## 2015-08-20 DIAGNOSIS — I499 Cardiac arrhythmia, unspecified: Secondary | ICD-10-CM | POA: Insufficient documentation

## 2015-08-20 DIAGNOSIS — J45909 Unspecified asthma, uncomplicated: Secondary | ICD-10-CM | POA: Diagnosis not present

## 2015-08-20 DIAGNOSIS — R42 Dizziness and giddiness: Secondary | ICD-10-CM | POA: Diagnosis present

## 2015-08-20 DIAGNOSIS — Z87891 Personal history of nicotine dependence: Secondary | ICD-10-CM | POA: Diagnosis not present

## 2015-08-20 DIAGNOSIS — M1990 Unspecified osteoarthritis, unspecified site: Secondary | ICD-10-CM | POA: Diagnosis not present

## 2015-08-20 DIAGNOSIS — Z7901 Long term (current) use of anticoagulants: Secondary | ICD-10-CM | POA: Diagnosis not present

## 2015-08-20 DIAGNOSIS — R0789 Other chest pain: Secondary | ICD-10-CM | POA: Diagnosis not present

## 2015-08-20 DIAGNOSIS — E785 Hyperlipidemia, unspecified: Secondary | ICD-10-CM | POA: Insufficient documentation

## 2015-08-20 DIAGNOSIS — N289 Disorder of kidney and ureter, unspecified: Secondary | ICD-10-CM | POA: Insufficient documentation

## 2015-08-20 DIAGNOSIS — Z79899 Other long term (current) drug therapy: Secondary | ICD-10-CM | POA: Insufficient documentation

## 2015-08-20 DIAGNOSIS — I350 Nonrheumatic aortic (valve) stenosis: Secondary | ICD-10-CM | POA: Diagnosis not present

## 2015-08-20 DIAGNOSIS — E860 Dehydration: Principal | ICD-10-CM | POA: Insufficient documentation

## 2015-08-20 DIAGNOSIS — R0689 Other abnormalities of breathing: Secondary | ICD-10-CM

## 2015-08-20 DIAGNOSIS — R062 Wheezing: Secondary | ICD-10-CM

## 2015-08-20 DIAGNOSIS — I481 Persistent atrial fibrillation: Secondary | ICD-10-CM | POA: Diagnosis not present

## 2015-08-20 DIAGNOSIS — N179 Acute kidney failure, unspecified: Secondary | ICD-10-CM | POA: Diagnosis not present

## 2015-08-20 DIAGNOSIS — K219 Gastro-esophageal reflux disease without esophagitis: Secondary | ICD-10-CM | POA: Insufficient documentation

## 2015-08-20 DIAGNOSIS — E1140 Type 2 diabetes mellitus with diabetic neuropathy, unspecified: Secondary | ICD-10-CM | POA: Diagnosis present

## 2015-08-20 DIAGNOSIS — E1129 Type 2 diabetes mellitus with other diabetic kidney complication: Secondary | ICD-10-CM

## 2015-08-20 DIAGNOSIS — I951 Orthostatic hypotension: Secondary | ICD-10-CM | POA: Diagnosis present

## 2015-08-20 DIAGNOSIS — E1142 Type 2 diabetes mellitus with diabetic polyneuropathy: Secondary | ICD-10-CM

## 2015-08-20 DIAGNOSIS — I10 Essential (primary) hypertension: Secondary | ICD-10-CM | POA: Insufficient documentation

## 2015-08-20 DIAGNOSIS — I4891 Unspecified atrial fibrillation: Secondary | ICD-10-CM | POA: Insufficient documentation

## 2015-08-20 DIAGNOSIS — R0600 Dyspnea, unspecified: Secondary | ICD-10-CM | POA: Diagnosis not present

## 2015-08-20 DIAGNOSIS — Z9119 Patient's noncompliance with other medical treatment and regimen: Secondary | ICD-10-CM | POA: Diagnosis not present

## 2015-08-20 HISTORY — DX: Acute kidney failure, unspecified: N17.9

## 2015-08-20 HISTORY — DX: Unspecified asthma with (acute) exacerbation: J45.901

## 2015-08-20 LAB — URINALYSIS, ROUTINE W REFLEX MICROSCOPIC
Bilirubin Urine: NEGATIVE
GLUCOSE, UA: NEGATIVE mg/dL
Ketones, ur: NEGATIVE mg/dL
LEUKOCYTES UA: NEGATIVE
Nitrite: NEGATIVE
PH: 6 (ref 5.0–8.0)
Protein, ur: NEGATIVE mg/dL
SPECIFIC GRAVITY, URINE: 1.01 (ref 1.005–1.030)
Urobilinogen, UA: 0.2 mg/dL (ref 0.0–1.0)

## 2015-08-20 LAB — COMPREHENSIVE METABOLIC PANEL
ALT: 27 U/L (ref 17–63)
AST: 23 U/L (ref 15–41)
Albumin: 4.3 g/dL (ref 3.5–5.0)
Alkaline Phosphatase: 73 U/L (ref 38–126)
Anion gap: 11 (ref 5–15)
BILIRUBIN TOTAL: 0.8 mg/dL (ref 0.3–1.2)
BUN: 18 mg/dL (ref 6–20)
CHLORIDE: 98 mmol/L — AB (ref 101–111)
CO2: 26 mmol/L (ref 22–32)
CREATININE: 1.32 mg/dL — AB (ref 0.61–1.24)
Calcium: 9.1 mg/dL (ref 8.9–10.3)
GFR calc Af Amer: 57 mL/min — ABNORMAL LOW (ref >60)
GFR, EST NON AFRICAN AMERICAN: 49 mL/min — AB (ref >60)
GLUCOSE: 113 mg/dL — AB (ref 65–99)
Potassium: 4.3 mmol/L (ref 3.5–5.1)
Sodium: 135 mmol/L (ref 135–145)
Total Protein: 8.2 g/dL — ABNORMAL HIGH (ref 6.5–8.1)

## 2015-08-20 LAB — CBC WITH DIFFERENTIAL/PLATELET
Basophils Absolute: 0.1 10*3/uL (ref 0.0–0.1)
Basophils Relative: 1 %
EOS ABS: 0.2 10*3/uL (ref 0.0–0.7)
Eosinophils Relative: 3 %
HEMATOCRIT: 48.1 % (ref 39.0–52.0)
HEMOGLOBIN: 17.2 g/dL — AB (ref 13.0–17.0)
LYMPHS PCT: 19 %
Lymphs Abs: 1.5 10*3/uL (ref 0.7–4.0)
MCH: 31.9 pg (ref 26.0–34.0)
MCHC: 35.8 g/dL (ref 30.0–36.0)
MCV: 89.1 fL (ref 78.0–100.0)
MONOS PCT: 8 %
Monocytes Absolute: 0.7 10*3/uL (ref 0.1–1.0)
NEUTROS ABS: 5.7 10*3/uL (ref 1.7–7.7)
NEUTROS PCT: 69 %
Platelets: 247 10*3/uL (ref 150–400)
RBC: 5.4 MIL/uL (ref 4.22–5.81)
RDW: 13.1 % (ref 11.5–15.5)
WBC: 8.2 10*3/uL (ref 4.0–10.5)

## 2015-08-20 LAB — TROPONIN I

## 2015-08-20 LAB — GLUCOSE, CAPILLARY: GLUCOSE-CAPILLARY: 157 mg/dL — AB (ref 65–99)

## 2015-08-20 LAB — URINE MICROSCOPIC-ADD ON

## 2015-08-20 LAB — LACTIC ACID, PLASMA
Lactic Acid, Venous: 3.1 mmol/L (ref 0.5–2.0)
Lactic Acid, Venous: 2 mmol/L (ref 0.5–2.0)

## 2015-08-20 LAB — BRAIN NATRIURETIC PEPTIDE: B NATRIURETIC PEPTIDE 5: 41 pg/mL (ref 0.0–100.0)

## 2015-08-20 LAB — PROTIME-INR
INR: 2.08 — ABNORMAL HIGH (ref 0.00–1.49)
Prothrombin Time: 23.3 seconds — ABNORMAL HIGH (ref 11.6–15.2)

## 2015-08-20 LAB — CREATININE, URINE, RANDOM: Creatinine, Urine: 116.7 mg/dL

## 2015-08-20 LAB — SODIUM, URINE, RANDOM: SODIUM UR: 29 mmol/L

## 2015-08-20 LAB — PROCALCITONIN: Procalcitonin: <0.1 ng/mL

## 2015-08-20 MED ORDER — HYDROCODONE-ACETAMINOPHEN 5-325 MG PO TABS: 1.0000 | ORAL_TABLET | Freq: Two times a day (BID) | ORAL | Status: DC | PRN

## 2015-08-20 MED ORDER — SODIUM CHLORIDE 0.9 % IV SOLN
INTRAVENOUS | Status: DC
Start: 2015-08-20 — End: 2015-08-20
  Administered 2015-08-20: 75 mL/h via INTRAVENOUS

## 2015-08-20 MED ORDER — TRAZODONE HCL 50 MG PO TABS
50.0000 mg | ORAL_TABLET | Freq: Every day | ORAL | Status: DC
  Administered 2015-08-20: 50 mg via ORAL
  Filled 2015-08-20: qty 1

## 2015-08-20 MED ORDER — SODIUM CHLORIDE 0.9 % IV BOLUS (SEPSIS)
250.0000 mL | Freq: Once | INTRAVENOUS | Status: AC
  Administered 2015-08-20: 250 mL via INTRAVENOUS

## 2015-08-20 MED ORDER — PREDNISONE 20 MG PO TABS
40.0000 mg | ORAL_TABLET | Freq: Every day | ORAL | Status: DC
  Administered 2015-08-21: 40 mg via ORAL
  Filled 2015-08-20: qty 2

## 2015-08-20 MED ORDER — SODIUM CHLORIDE 0.9 % IV BOLUS (SEPSIS)
500.0000 mL | Freq: Once | INTRAVENOUS | Status: AC
  Administered 2015-08-20: 500 mL via INTRAVENOUS

## 2015-08-20 MED ORDER — ALBUTEROL SULFATE (2.5 MG/3ML) 0.083% IN NEBU: 2.5000 mg | INHALATION_SOLUTION | Freq: Four times a day (QID) | RESPIRATORY_TRACT | Status: DC

## 2015-08-20 MED ORDER — ALBUTEROL SULFATE (2.5 MG/3ML) 0.083% IN NEBU: 2.5000 mg | INHALATION_SOLUTION | RESPIRATORY_TRACT | Status: DC | PRN

## 2015-08-20 MED ORDER — IPRATROPIUM-ALBUTEROL 0.5-2.5 (3) MG/3ML IN SOLN
3.0000 mL | Freq: Four times a day (QID) | RESPIRATORY_TRACT | Status: DC
  Administered 2015-08-21: 3 mL via RESPIRATORY_TRACT
  Filled 2015-08-20: qty 3

## 2015-08-20 MED ORDER — POTASSIUM CHLORIDE ER 10 MEQ PO TBCR
20.0000 meq | EXTENDED_RELEASE_TABLET | Freq: Once | ORAL | Status: AC
  Administered 2015-08-20: 20 meq via ORAL
  Filled 2015-08-20 (×2): qty 2

## 2015-08-20 MED ORDER — INSULIN ASPART 100 UNIT/ML ~~LOC~~ SOLN: 0.0000 [IU] | Freq: Every day | SUBCUTANEOUS | Status: DC

## 2015-08-20 MED ORDER — IPRATROPIUM BROMIDE 0.02 % IN SOLN: 0.5000 mg | Freq: Once | RESPIRATORY_TRACT | Status: DC

## 2015-08-20 MED ORDER — SODIUM CHLORIDE 0.9 % IV SOLN: INTRAVENOUS | Status: DC

## 2015-08-20 MED ORDER — INSULIN ASPART 100 UNIT/ML ~~LOC~~ SOLN
0.0000 [IU] | Freq: Three times a day (TID) | SUBCUTANEOUS | Status: DC
  Administered 2015-08-21: 2 [IU] via SUBCUTANEOUS

## 2015-08-20 MED ORDER — WARFARIN - PHARMACIST DOSING INPATIENT: Status: DC

## 2015-08-20 MED ORDER — NORTRIPTYLINE HCL 10 MG PO CAPS
ORAL_CAPSULE | ORAL | Status: AC
  Filled 2015-08-20: qty 2

## 2015-08-20 MED ORDER — METOPROLOL SUCCINATE ER 25 MG PO TB24
12.5000 mg | ORAL_TABLET | Freq: Every day | ORAL | Status: DC
  Administered 2015-08-21: 12.5 mg via ORAL
  Filled 2015-08-20: qty 1

## 2015-08-20 MED ORDER — IPRATROPIUM-ALBUTEROL 0.5-2.5 (3) MG/3ML IN SOLN
3.0000 mL | Freq: Four times a day (QID) | RESPIRATORY_TRACT | Status: DC
  Administered 2015-08-20: 3 mL via RESPIRATORY_TRACT
  Filled 2015-08-20: qty 3

## 2015-08-20 MED ORDER — NORTRIPTYLINE HCL 10 MG PO CAPS
20.0000 mg | ORAL_CAPSULE | Freq: Every day | ORAL | Status: DC
  Administered 2015-08-20: 20 mg via ORAL
  Filled 2015-08-20 (×4): qty 2

## 2015-08-20 NOTE — ED Notes (Signed)
Pt reports dizziness with standing,sob,chest tightness for last several weeks. Pt sent here from Dr. Pierre Bali office. Moderate dyspnea noted in triage.

## 2015-08-20 NOTE — Progress Notes (Signed)
   Subjective:    Patient ID: [name] [name], male    DOB: January 28, 1932, 81 y.o.   MRN: [id number]  Cough This is a new problem. The current episode started in the past 7 days. Associated symptoms include headaches, nasal congestion, a sore throat and wheezing. Associated symptoms comments: sob.   Patient started proximally 4 days ago with head congestion drainage coughing not feeling good them progressed into wheezing shortness of breath dizziness poor appetite poor fluid intake. Past medical history-atrial fibrillation, history of stroke, aortic stenosis, exertional dyspnea, intermittent reactive airway, chronic Coumadin 25 minutes spent with patient evaluating him. Talking with family. Initiating transfer to the ER. Review of Systems  HENT: Positive for sore throat.   Respiratory: Positive for cough and wheezing.   Neurological: Positive for headaches.   patient denies high fever does relate some chills denies sweats relates shortness of breath both at rest and with activity patient relates significant dizziness when sitting up or standing poor by mouth intake over the past several days decreased activity the past several days     Objective:   Physical Exam Patient looks to feel ill. Patient is orthostatic. Blood pressure 110/68 laying 86/60 sitting 74 over palpable standing patient with obvious weakness upon standing Some crackles in the bases heart irregular Abdomen soft no masses Extremities skin warm dry but cooler in the feet and hands Neurologic patient aware of surroundings interacts appropriately but patient at his baseline  bilateral expiratory wheeze Patient appears pale unable to get O2 sat meter to work. It would not read on this patient. Also unable to get hemoglobin.  No obvious sign of stroke    Assessment & Plan:   Significant orthostasis with recent illness wheezing shortness of breath suspect the possibility of pneumonia and dehydration patient needs further testing  may need IV fluids may need admission. ER doctor spoke with. Family member will bring the patient to the ER for further evaluation

## 2015-08-20 NOTE — ED Notes (Signed)
Assumed care of patient from [place], [place]. Pt resting quietly at this time. Awaiting disposition. No acute distress noted. Skin pink and dry. Reports he feels that he is improving since receiving IV Fluid.

## 2015-08-20 NOTE — H&P (Signed)
History and Physical  [name] [name]  [id number]  DOB: January 28, 1932  DOA: 08/20/2015  Referring physician: Francine Graven, MD PCP: Sallee Lange, MD   Chief Complaint: Cough, wheezing and dizziness  HPI: [name] [name] is a 81 y.o. male with a past medical history significant for A. fib on warfarin, pPVD, intermittent asthma, hypertension, and diabetes with neuropathy who presents with 1 week increased cough/wheeze and 2 days increased fatigue and dizziness with standing.  The patient was in his usual state of health until about one week ago when he started to have cough, and increased wheezing. During this time he wasn't eating or drinking much, and in the last few days this has progressed until he feels tired and lightheaded/"swimmy headed" with standing. He was seen in his PCPs office where he was noted to be orthostatic and sent to the ER. Of note he has chronic dyspnea on exertion, with normal PFTs in 2012 and a normal echo in 2013.  In the ED, he was hemodynamically stable and saturating well on ambient air.  He was wheezing but had no fever, leukocytosis, or new lung infiltrates on chest x-ray. However, he had an elevated lactic acid, elevated creatinine (1.32 mg/dL from a baseline of 0.8).       Review of Systems:  Patient seen 7:54 PM on 08/20/2015. Pt complains of cough, wheezing, fatigue, dizziness with standing.   Pt denies any fever, chills, hematochezia, melena, dysphagia, vomiting.  All other systems were negative.  Past Medical History  Diagnosis Date  . Hypertension     Borderline  . Cerebrovascular disease 07/2010    TIA; carotid ultrasound in 07/2010-significant bilateral plaque without focal internal carotid artery stenosis; MRI -encephalomalacia left temporal and right temporal lobes; small inferior right cerebellar infarct; small vessel disease  . Hyperlipidemia     adverse reactions to statins and niacin  . History of noncompliance with medical treatment   .  Aortic stenosis 2007    Very mild  . Hepatic steatosis   . Diabetes mellitus     no insulin; A1c of 6.6 in 2005  . Atrial fibrillation (HCC)     Paroxysmal; Echocardiogram in 2007-normal EF; mild LVH; left atrial enlargement; mild stenosis and minimal AI; negative stress nuclear study in 2008  . Exertional dyspnea   . Degenerative joint disease     feet and legs  . Dizziness     occurs daily,especially in am  . Gastroesophageal reflux disease   . Renal insufficiency   . Asthma   . Irregular heartbeat   . Peripheral vascular disease ([place])   . Temporal arteritis ([place])   The above past medical history was reviewed.  Past Surgical History  Procedure Laterality Date  . Rotator cuff repair      Right  . Lipoma excision  1980  . Urethral stricture dilatation  1980s  . Orif ankle fracture  2000    Right  . Colonoscopy  2002  . Transurethral resection of prostate  09/2011  . Colonoscopy  01/19/2012    Procedure: COLONOSCOPY;  Surgeon: Rogene Houston, MD;  Location: [facility];  Service: Endoscopy;  Laterality: N/A;  1030  . Prostate surgery  12/2011  The above surgical history was reviewed.  Social History: Patient lives with his wife at home. They have nursing aides who come in the house to help his with his wife who is disabled. He is a former smoker quit 5 years ago. He does not drink alcohol. 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  Allergies  Allergen Reactions  . Ambien [Zolpidem Tartrate] Other (See Comments)    Sleep walks  . Cholestatin   . Lipitor [Atorvastatin Calcium] Other (See Comments)    myalgias  . Ranitidine Other (See Comments)    Chest discomfort  . Simvastatin Other (See Comments)    Myalgias  . Xanax Xr [Alprazolam Er]     Tightness in chest    Family History  Problem Relation Age of Onset  . Stroke Mother   . Diabetes Father   . Colon cancer Neg Hx   No family history of lung disease.  Prior to Admission medications   Medication Sig  Start Date End Date Taking? Authorizing Provider  albuterol (PROVENTIL HFA;VENTOLIN HFA) 108 (90 BASE) MCG/ACT inhaler Inhale 2 puffs into the lungs every 4 (four) hours as needed. 11/15/13  Yes Kathyrn Drown, MD  furosemide (LASIX) 20 MG tablet TAKE 1/2 TABLET IN MORNING FOR BLOOD PRESSURE & FLUID. 07/16/15  Yes Kathyrn Drown, MD  glipiZIDE (GLUCOTROL) 5 MG tablet Take one tablet qam and one half tablets at supper Patient taking differently: Take 2.5-5 mg by mouth 2 (two) times daily. Take one tablet qam and one half tablets at supper 07/08/15  Yes Kathyrn Drown, MD  guaifenesin (MUCUS RELIEF) 400 MG TABS tablet Take 400 mg by mouth daily as needed (for congestion).   Yes Historical Provider, MD  HYDROcodone-acetaminophen (NORCO/VICODIN) 5-325 MG per tablet Take 1 tablet by mouth 2 (two) times daily as needed for moderate pain. 06/29/15  Yes Kathyrn Drown, MD  metFORMIN (GLUCOPHAGE) 500 MG tablet 1/2 tablet twice a day Patient taking differently: Take 250 mg by mouth 2 (two) times daily. 1/2 tablet twice a day 06/29/15  Yes Kathyrn Drown, MD  metoprolol succinate (TOPROL-XL) 25 MG 24 hr tablet TAKE ONE HALF TABLET DAILY FOR BLOOD PRESSURE. Patient taking differently: Take 12.5 mg by mouth daily. TAKE ONE HALF TABLET DAILY FOR BLOOD PRESSURE. 06/29/15  Yes Kathyrn Drown, MD  nortriptyline (PAMELOR) 10 MG capsule 1 to 2 at bedtime for burning in feet Patient taking differently: Take 20 mg by mouth at bedtime.  06/29/15  Yes Kathyrn Drown, MD  potassium chloride (K-DUR) 10 MEQ tablet TAKE 2 TABLETS BY MOUTH ONCE DAILY. 07/03/15  Yes Kathyrn Drown, MD  traZODone (DESYREL) 50 MG tablet 1 qhs Patient taking differently: Take 50 mg by mouth at bedtime. 1 qhs 06/29/15  Yes Kathyrn Drown, MD  warfarin (COUMADIN) 5 MG tablet Take 1 tablet (5 mg total) by mouth daily. TAKE AS DIRECTED BY PHYSICIAN Patient taking differently: Take 2.5-5 mg by mouth every morning. Take one-half tablet on Mondays and Fridays and  take one tablet on all other days 06/29/15  Yes Kathyrn Drown, MD  ONE TOUCH ULTRA TEST test strip FOLLOW PACKAGE DIRECTIONS TO TEST BLOOD SUGAR TWICE DAILY AS NEEDED DUE TO FLUCTUATING SUGARS 11/15/13   Kathyrn Drown, MD    Physical Exam: BP 126/67 mmHg  Pulse 87  Temp(Src) 97.6 F (36.4 C) (Oral)  Resp 15  Ht 6' 1.5" (1.867 m)  Wt 112.038 kg (247 lb)  BMI 32.14 kg/m2  SpO2 99% General: Well-developed, adult male, appears younger than his stated age, alert and in no acute distress.  Responds appropriately to questions.   HEENT: Head normal.   Ears: Normal auditory acuity. Eyes: Conjunctiva pink, lids and lashes normal.  PERRL and EOMI.  Nose: No deformity, discharge, or epistaxis.   Mouth: Dentures in place. OP moist without erythema, exudates, cobblestoning, or ulcers.  No airway deformities.   Neck: Supple. No thyromegaly or masses.  Lymph: No cervical, supraclavicular lymphadenopathy. Skin: Warm and dry.  No jaundice.  Diffuse hyperpigmentation on the forearms and lower extremities, appearing sequela of senile purpura. No suspicious rashes or lesions on the face neck chest back arms or legs. Cardiac: Irregularly irregular, nl S1-S2, no murmurs appreciated.  Capillary refill is less than 2 seconds.  JVP normal.  No LE edema.  Radial pulses 2+ and symmetric. Respiratory: Normal respiratory rate and rhythm.  Bilateral expiratory wheezes. No focal rales.  Abdomen: BS present.  Abdomen soft without rigidity.  No TTP or rebound all quadrants. No ascites, distension.   Neuro: Sensorium intact.  Cranial nerves grossly intact.  Speech is fluent.  Attention and concentration are normal.  Memory seems mildly impaired, and the patient is tangential in thought process.  Moves all extremities equally and with normal coordination.    Psych: Appropriate affect.  Speech normal. No evidence of aural or visual hallucinations or delusions.       Labs on Admission:  The metabolic panel is notable for  normal sodium, potassium, bicarbonate. The serum creatinine is 1.32 mg/dL from a baseline of 0.8 mg/dL. The transaminases and bilirubin are normal. The BNP is normal. The troponin is negative. The lactic acid is elevated at 3.1 mmol/L. The complete blood count is notable for no evidence of leukocytosis, slight hemoconcentration, normal platelets.   Radiological Exams on Admission: Personally reviewed: Dg Chest Port 1 View 08/20/2015 This appears hazy bilaterally, but compared with his chest x-ray from 2014 there are no new focal opacities.    EKG: Independently reviewed. Atrial fibrillation, rate 74, no ST changes.    Assessment/Plan Active Problems:   Hypertension   Atrial fibrillation (HCC)   Diabetes mellitus (HCC)   Diabetic neuropathy (HCC)   Orthostatic hypotension   AKI (acute kidney injury) ([place])   Asthma exacerbation  1. Orthostatic hypotension from hypovolemia:  This appears to be from poor intake over the last 2 days. -1 L normal saline given in the ER -Continue normal saline at 150 mL/h overnight -Repeat orthostatic vitals tomorrow  2. Acute asthma exacerbation:  The patient has normal PFTs in 2012, and a history of intermittent asthma. He's very wheezy on exam but doesn't appear to be working hard to breathe. This appears to be severe enough that it precipitated #1 above, and so I will treat with: -Prednisone 40 mg for 5 days -Nebulized albuterol-ipratropium once followed by albuterol every 6 hours -Clinically doesn't appear to have a pneumonia, and I will confirm with Procalcitonin  3. Acute kidney injury:  This is new. Suspect that this is related to hypovolemia/pre-renal azotemia. -Fluid resuscitation as above -Urine electrolytes -If this is worse tomorrow rather than better after fluid resuscitation consider obstruction as the patient has history of BPH  4. Elevated lactic acid:  This is new. The patient does not meet criteria for sepsis nor does he  appear to be septic. Suspect that this is all due to #1 above -Repeat lactic acid after fluid resuscitation  5. Type 2 diabetes, well controlled complicated by neuropathy:  Stable. -Hold home orals given diminished renal function at the moment. -Sliding scale corrections -Continue nortriptyline for neuropathy  6. Hypertension:  Stable. At goal. -Hold furosemide given hypovolemia. Restart at discharge -Continue metoprolol -Continue home potassium  7. Atrial fibrillation:  Stable. -Continue warfarin and metoprolol     DVT PPx: On warfarin Diet: Diabetic Code Status: Full  Disposition Plan:  Admission for observation is judged to be reasonable and necessary at this time because the patient is unable to take in enough by mouth in the context of his acute respiratory illness and has become hypovolemic with elevated lactic acid and acute kidney injury, which is untreated could lead to serious morbidity. It is expected that his lactic acid will clear overnight and that his renal function will be returned to baseline by tomorrow. Otherwise his acute respiratory illness alone is rather mild and does not warrant inpatient treatment. He has a wife at home who he normally takes care of at night, but for whom a niece is watching her tonight and so he is motivated to be discharged.      Edwin Dada [facility] Pager [phone]

## 2015-08-20 NOTE — ED Notes (Signed)
CRITICAL VALUE ALERT  Critical value received: Lactic acid 3.1  Date of notification:  08/20/2015  Time of notification:  8101  Critical value read back:Yes.    Nurse who received alert:  Laurell Josephs RN  MD notified (1st page):  Thurnell Garbe  Time of first page:  [phone]  MD notified (2nd page):  Time of second page:  Responding MD:  Thurnell Garbe  Time MD responded:  [phone]

## 2015-08-20 NOTE — ED Notes (Signed)
According to [facility], patient assigned to 305, attempted to call report to inpatient unit, RN states they will have to call back for report.

## 2015-08-20 NOTE — ED Provider Notes (Signed)
CSN: [id number]     Arrival date & time 08/20/15  1716 History   First MD Initiated Contact with Patient 08/20/15 1718     Chief Complaint  Patient presents with  . Dizziness  . Cough  . Shortness of Breath     HPI  Pt was seen at 1725. Per pt and his family, c/o gradual onset and worsening of persistent cough and SOB for the past 4 days. Has been associated with decreased PO intake, generalized weakness/fatigue, and lightheadedness when moving from sitting to standing. Pt was evaluated at his PMD's office PTA, then sent to the ED for further evaluation. Denies N/V/D, no abd pain, no back pain, no fevers, no rash, no focal motor weakness, no tingling/numbness in extremities, no syncope.   Past Medical History  Diagnosis Date  . Hypertension     Borderline  . Cerebrovascular disease 07/2010    TIA; carotid ultrasound in 07/2010-significant bilateral plaque without focal internal carotid artery stenosis; MRI -encephalomalacia left temporal and right temporal lobes; small inferior right cerebellar infarct; small vessel disease  . Hyperlipidemia     adverse reactions to statins and niacin  . History of noncompliance with medical treatment   . Aortic stenosis 2007    Very mild  . Hepatic steatosis   . Diabetes mellitus     no insulin; A1c of 6.6 in 2005  . Atrial fibrillation (HCC)     Paroxysmal; Echocardiogram in 2007-normal EF; mild LVH; left atrial enlargement; mild stenosis and minimal AI; negative stress nuclear study in 2008  . Exertional dyspnea   . Degenerative joint disease     feet and legs  . Dizziness     occurs daily,especially in am  . Gastroesophageal reflux disease   . Renal insufficiency   . Asthma   . Irregular heartbeat   . Peripheral vascular disease ([place])   . Temporal arteritis [facility])    Past Surgical History  Procedure Laterality Date  . Rotator cuff repair      Right  . Lipoma excision  1980  . Urethral stricture dilatation  1980s  . Orif ankle fracture   2000    Right  . Colonoscopy  2002  . Transurethral resection of prostate  09/2011  . Colonoscopy  01/19/2012    Procedure: COLONOSCOPY;  Surgeon: Rogene Houston, MD;  Location: [facility] SUITE;  Service: Endoscopy;  Laterality: N/A;  1030  . Prostate surgery  12/2011   Family History  Problem Relation Age of Onset  . Stroke Mother   . Diabetes Father   . Colon cancer Neg Hx    Social History  Substance Use Topics  . Smoking status: Former Smoker -- 1.00 packs/day for 20 years    Types: Cigarettes    Quit date: 04/25/1992  . Smokeless tobacco: Former Systems developer    Types: [place] date: 12/16/2011  . Alcohol Use: No    Review of Systems ROS: Statement: All systems negative except as marked or noted in the HPI; Constitutional: Negative for fever and chills. +generalized weakness/fatigue. ; ; Eyes: Negative for eye pain, redness and discharge. ; ; ENMT: Negative for ear pain, hoarseness, nasal congestion, sinus pressure and sore throat. ; ; Cardiovascular: Negative for chest pain, palpitations, diaphoresis, and peripheral edema. ; ; Respiratory: +cough, wheezing, SOB. Negative for stridor. ; ; Gastrointestinal: Negative for nausea, vomiting, diarrhea, abdominal pain, blood in stool, hematemesis, jaundice and rectal bleeding. . ; ; Genitourinary: Negative for dysuria, flank pain  and hematuria. ; ; Musculoskeletal: Negative for back pain and neck pain. Negative for swelling and trauma.; ; Skin: Negative for pruritus, rash, abrasions, blisters, bruising and skin lesion.; ; Neuro: +lightheadedness. Negative for headache and neck stiffness. Negative for altered level of consciousness , altered mental status, extremity weakness, paresthesias, involuntary movement, seizure and syncope.      Allergies  Ambien; Cholestatin; Lipitor; Ranitidine; Simvastatin; and Xanax xr  Home Medications   Prior to Admission medications   Medication Sig Start Date End Date Taking? Authorizing Provider  furosemide  (LASIX) 20 MG tablet TAKE 1/2 TABLET IN MORNING FOR BLOOD PRESSURE & FLUID. 07/16/15  Yes Kathyrn Drown, MD  glipiZIDE (GLUCOTROL) 5 MG tablet Take one tablet qam and one half tablets at supper Patient taking differently: Take 2.5-5 mg by mouth 2 (two) times daily. Take one tablet qam and one half tablets at supper 07/08/15  Yes Kathyrn Drown, MD  guaifenesin (MUCUS RELIEF) 400 MG TABS tablet Take 400 mg by mouth daily as needed (for congestion).   Yes Historical Provider, MD  metFORMIN (GLUCOPHAGE) 500 MG tablet 1/2 tablet twice a day Patient taking differently: Take 250 mg by mouth 2 (two) times daily. 1/2 tablet twice a day 06/29/15  Yes Kathyrn Drown, MD  metoprolol succinate (TOPROL-XL) 25 MG 24 hr tablet TAKE ONE HALF TABLET DAILY FOR BLOOD PRESSURE. Patient taking differently: Take 12.5 mg by mouth daily. TAKE ONE HALF TABLET DAILY FOR BLOOD PRESSURE. 06/29/15  Yes Kathyrn Drown, MD  nortriptyline (PAMELOR) 10 MG capsule 1 to 2 at bedtime for burning in feet 06/29/15  Yes Kathyrn Drown, MD  potassium chloride (K-DUR) 10 MEQ tablet TAKE 2 TABLETS BY MOUTH ONCE DAILY. 07/03/15  Yes Kathyrn Drown, MD  traZODone (DESYREL) 50 MG tablet 1 qhs Patient taking differently: Take 50 mg by mouth at bedtime. 1 qhs 06/29/15  Yes Kathyrn Drown, MD  albuterol (PROVENTIL HFA;VENTOLIN HFA) 108 (90 BASE) MCG/ACT inhaler Inhale 2 puffs into the lungs every 4 (four) hours as needed. 11/15/13   Kathyrn Drown, MD  fluticasone (FLONASE) 50 MCG/ACT nasal spray Place 1 spray into both nostrils daily as needed. Allergies 03/07/14   Kathyrn Drown, MD  HYDROcodone-acetaminophen (NORCO/VICODIN) 5-325 MG per tablet Take 1 tablet by mouth 2 (two) times daily as needed for moderate pain. 06/29/15   Kathyrn Drown, MD  ONE TOUCH ULTRA TEST test strip FOLLOW PACKAGE DIRECTIONS TO TEST BLOOD SUGAR TWICE DAILY AS NEEDED DUE TO FLUCTUATING SUGARS 11/15/13   Kathyrn Drown, MD  warfarin (COUMADIN) 5 MG tablet Take 1 tablet (5 mg total)  by mouth daily. TAKE AS DIRECTED BY PHYSICIAN 06/29/15   Kathyrn Drown, MD   BP 149/79 mmHg  Pulse 76  Resp 20  Ht 6' 1.5" (1.867 m)  Wt 247 lb (112.038 kg)  BMI 32.14 kg/m2   17:36 Orthostatic Vital Signs AC  Orthostatic Lying  - BP- Lying: 119/60 mmHg ; Pulse- Lying: 76  Orthostatic Sitting - BP- Sitting: 123/80 mmHg ; Pulse- Sitting: 78  Orthostatic Standing at 0 minutes - BP- Standing at 0 minutes: 97/61 mmHg ; Pulse- Standing at 0 minutes: 88      Physical Exam 1730: Physical examination:  Nursing notes reviewed; Vital signs and O2 SAT reviewed;  Constitutional: Well developed, Well nourished, In no acute distress; Head:  Normocephalic, atraumatic; Eyes: EOMI, PERRL, No scleral icterus; ENMT: Mouth and pharynx normal, Mucous membranes dry; Neck: Supple, Full range  of motion, No lymphadenopathy; Cardiovascular: Irregular irregular rate and rhythm, No gallop; Respiratory: Breath sounds coarse & equal bilaterally, No wheezes.  Speaking full sentences with ease, Normal respiratory effort/excursion; Chest: Nontender, Movement normal; Abdomen: Soft, Nontender, Nondistended, Normal bowel sounds; Genitourinary: No CVA tenderness; Extremities: Pulses normal, No tenderness, No edema, No calf edema or asymmetry.; Neuro: AA&Ox3, Major CN grossly intact.  Speech clear. No gross focal motor deficits in extremities.; Skin: Color normal, Warm, Dry.    ED Course  Procedures (including critical care time) Labs Review   Imaging Review  I have personally reviewed and evaluated these images and lab results as part of my medical decision-making.   EKG Interpretation   Date/Time:  Thursday August 20 2015 17:24:24 EDT Ventricular Rate:  74 PR Interval:    QRS Duration: 107 QT Interval:  394 QTC Calculation: 437 R Axis:   -15 Text Interpretation:  Atrial fibrillation Borderline left axis deviation  Abnormal R-wave progression, early transition When compared with ECG of  04/27/2013 No  significant change was found Confirmed by [facility]  MD,  Nunzio Cory [phone]) on 08/20/2015 5:54:55 PM      MDM  MDM Reviewed: previous chart, nursing note and vitals Reviewed previous: labs and ECG Interpretation: labs, ECG and x-ray   Results for orders placed or performed during the hospital encounter of 08/20/15  Brain natriuretic peptide  Result Value Ref Range   B Natriuretic Peptide 41.0 0.0 - 100.0 pg/mL  Comprehensive metabolic panel  Result Value Ref Range   Sodium 135 135 - 145 mmol/L   Potassium 4.3 3.5 - 5.1 mmol/L   Chloride 98 (L) 101 - 111 mmol/L   CO2 26 22 - 32 mmol/L   Glucose, Bld 113 (H) 65 - 99 mg/dL   BUN 18 6 - 20 mg/dL   Creatinine, Ser 1.32 (H) 0.61 - 1.24 mg/dL   Calcium 9.1 8.9 - 10.3 mg/dL   Total Protein 8.2 (H) 6.5 - 8.1 g/dL   Albumin 4.3 3.5 - 5.0 g/dL   AST 23 15 - 41 U/L   ALT 27 17 - 63 U/L   Alkaline Phosphatase 73 38 - 126 U/L   Total Bilirubin 0.8 0.3 - 1.2 mg/dL   GFR calc non Af Amer 49 (L) >60 mL/min   GFR calc Af Amer 57 (L) >60 mL/min   Anion gap 11 5 - 15  Troponin I  Result Value Ref Range   Troponin I <0.03 <0.031 ng/mL  Lactic acid, plasma  Result Value Ref Range   Lactic Acid, Venous 3.1 (HH) 0.5 - 2.0 mmol/L  CBC with Differential  Result Value Ref Range   WBC 8.2 4.0 - 10.5 K/uL   RBC 5.40 4.22 - 5.81 MIL/uL   Hemoglobin 17.2 (H) 13.0 - 17.0 g/dL   HCT 48.1 39.0 - 52.0 %   MCV 89.1 78.0 - 100.0 fL   MCH 31.9 26.0 - 34.0 pg   MCHC 35.8 30.0 - 36.0 g/dL   RDW 13.1 11.5 - 15.5 %   Platelets 247 150 - 400 K/uL   Neutrophils Relative % 69 %   Neutro Abs 5.7 1.7 - 7.7 K/uL   Lymphocytes Relative 19 %   Lymphs Abs 1.5 0.7 - 4.0 K/uL   Monocytes Relative 8 %   Monocytes Absolute 0.7 0.1 - 1.0 K/uL   Eosinophils Relative 3 %   Eosinophils Absolute 0.2 0.0 - 0.7 K/uL   Basophils Relative 1 %   Basophils Absolute 0.1 0.0 - 0.1  K/uL  Protime-INR  Result Value Ref Range   Prothrombin Time 23.3 (H) 11.6 - 15.2 seconds    INR 2.08 (H) 0.00 - 1.49   Dg Chest Port 1 View 08/20/2015   CLINICAL DATA:  Lightheadedness, dyspnea and central chest tightness of several weeks duration.  EXAM: PORTABLE CHEST 1 VIEW  COMPARISON:  04/1913  FINDINGS: A single AP portable view of the chest demonstrates no focal airspace consolidation or alveolar edema. The lungs are grossly clear. There is no large effusion or pneumothorax. Cardiac and mediastinal contours appear unremarkable.  IMPRESSION: No active disease.   Electronically Signed   By: Andreas Newport M.D.   On: 08/20/2015 18:48    Results for HUGO, LYBRAND (MRN [id number]) as of 08/20/2015 19:00  Ref. Range 08/06/2013 08:37 12/10/2013 09:30 08/01/2014 10:03 02/16/2015 11:06 05/29/2015 15:04 08/20/2015 17:45  BUN Latest Ref Range: 6-20 mg/dL 10 12 15 8 11 18   Creatinine Latest Ref Range: 0.61-1.24 mg/dL 0.95 0.90 0.99 1.04 0.83 1.32 (H)    1905:  Pt orthostatic on VS and appears clinically dehydrated; judicious IVF given. Dx and testing d/w pt and family.  Questions answered.  Verb understanding, agreeable to admit. T/C to [facility] Dr. Loleta Books, case discussed, including:  HPI, pertinent PM/SHx, VS/PE, dx testing, ED course and treatment:  Agreeable to admit, requests to write temporary orders, obtain medical bed to team [facility]mits.  2030: 2nd lactic acid trending downward after IVF. UA/UC pending.   Francine Graven, DO 08/24/15 [phone]

## 2015-08-21 DIAGNOSIS — I480 Paroxysmal atrial fibrillation: Secondary | ICD-10-CM | POA: Diagnosis not present

## 2015-08-21 DIAGNOSIS — I951 Orthostatic hypotension: Secondary | ICD-10-CM | POA: Diagnosis not present

## 2015-08-21 DIAGNOSIS — N179 Acute kidney failure, unspecified: Secondary | ICD-10-CM | POA: Diagnosis not present

## 2015-08-21 DIAGNOSIS — E860 Dehydration: Secondary | ICD-10-CM | POA: Diagnosis not present

## 2015-08-21 LAB — CBC
HEMATOCRIT: 43.1 % (ref 39.0–52.0)
Hemoglobin: 14.7 g/dL (ref 13.0–17.0)
MCH: 30.8 pg (ref 26.0–34.0)
MCHC: 34.1 g/dL (ref 30.0–36.0)
MCV: 90.4 fL (ref 78.0–100.0)
Platelets: 212 10*3/uL (ref 150–400)
RBC: 4.77 MIL/uL (ref 4.22–5.81)
RDW: 13.1 % (ref 11.5–15.5)
WBC: 8 10*3/uL (ref 4.0–10.5)

## 2015-08-21 LAB — BASIC METABOLIC PANEL
ANION GAP: 5 (ref 5–15)
BUN: 17 mg/dL (ref 6–20)
CALCIUM: 8.1 mg/dL — AB (ref 8.9–10.3)
CHLORIDE: 106 mmol/L (ref 101–111)
CO2: 27 mmol/L (ref 22–32)
Creatinine, Ser: 1.03 mg/dL (ref 0.61–1.24)
GFR calc Af Amer: >60 mL/min (ref >60)
GFR calc non Af Amer: >60 mL/min (ref >60)
GLUCOSE: 163 mg/dL — AB (ref 65–99)
POTASSIUM: 4.6 mmol/L (ref 3.5–5.1)
Sodium: 138 mmol/L (ref 135–145)

## 2015-08-21 LAB — GLUCOSE, CAPILLARY
GLUCOSE-CAPILLARY: 186 mg/dL — AB (ref 65–99)
Glucose-Capillary: 185 mg/dL — ABNORMAL HIGH (ref 65–99)

## 2015-08-21 MED ORDER — WARFARIN SODIUM 5 MG PO TABS: 2.5000 mg | ORAL_TABLET | Freq: Once | ORAL | Status: DC

## 2015-08-21 NOTE — Discharge Summary (Signed)
Physician Discharge Summary  [name] [name] [id number] DOB: 01/09/1932 DOA: 08/20/2015  PCP: Sallee Lange, MD  Admit date: 08/20/2015 Discharge date: 08/21/2015  Time spent: 45 minutes  Recommendations for Outpatient Follow-up:  -Will be discharged home today. -Advised to follow up with PCP in 2 weeks.   Discharge Diagnoses:  Active Problems:   Hypertension   Atrial fibrillation (HCC)   Diabetes mellitus ([place])   Diabetic neuropathy (HCC)   Orthostatic hypotension   AKI (acute kidney injury) ([place])   Asthma exacerbation   Discharge Condition: Stable and improved  Filed Weights   08/20/15 1724 08/20/15 2225  Weight: 112.038 kg (247 lb) 110.224 kg (243 lb)    History of present illness:  As per Dr. Loleta Books 10/6: [name] [name] is a 81 y.o. male with a past medical history significant for A. fib on warfarin, pPVD, intermittent asthma, hypertension, and diabetes with neuropathy who presents with 1 week increased cough/wheeze and 2 days increased fatigue and dizziness with standing.  The patient was in his usual state of health until about one week ago when he started to have cough, and increased wheezing. During this time he wasn't eating or drinking much, and in the last few days this has progressed until he feels tired and lightheaded/"swimmy headed" with standing. He was seen in his PCPs office where he was noted to be orthostatic and sent to the ER. Of note he has chronic dyspnea on exertion, with normal PFTs in 2012 and a normal echo in 2013.  In the ED, he was hemodynamically stable and saturating well on ambient air. He was wheezing but had no fever, leukocytosis, or new lung infiltrates on chest x-ray. However, he had an elevated lactic acid, elevated creatinine (1.32 mg/dL from a baseline of 0.8).      Hospital Course:   Orthostatic Hypotension -Resolved with IVF. -Secondary to decreased oral intake. Patient has had a lot of social stressors recently including  the death of several family members and caring for his ailing wife.  ARF -Resolved with IVF, 2/2 prerenal azotemia. -Will resume lasix at tis point. -Advised close follow up with PCP for monitoring.  DM II -Fair control. -Continue current medications.  HTN -Well controlled. -Continue current medications.  A Fib -Rate controlled. -Anticoagulated on coumadin.  Procedures:  None   Consultations:  None  Discharge Instructions  Discharge Instructions    Diet - low sodium heart healthy    Complete by:  As directed      Increase activity slowly    Complete by:  As directed             Medication List    TAKE these medications        albuterol 108 (90 BASE) MCG/ACT inhaler  Commonly known as:  PROVENTIL HFA;VENTOLIN HFA  Inhale 2 puffs into the lungs every 4 (four) hours as needed.     furosemide 20 MG tablet  Commonly known as:  LASIX  TAKE 1/2 TABLET IN MORNING FOR BLOOD PRESSURE & FLUID.     glipiZIDE 5 MG tablet  Commonly known as:  GLUCOTROL  Take one tablet qam and one half tablets at supper     HYDROcodone-acetaminophen 5-325 MG tablet  Commonly known as:  NORCO/VICODIN  Take 1 tablet by mouth 2 (two) times daily as needed for moderate pain.     metFORMIN 500 MG tablet  Commonly known as:  GLUCOPHAGE  1/2 tablet twice a day     metoprolol  succinate 25 MG 24 hr tablet  Commonly known as:  TOPROL-XL  TAKE ONE HALF TABLET DAILY FOR BLOOD PRESSURE.     MUCUS RELIEF 400 MG Tabs tablet  Generic drug:  guaifenesin  Take 400 mg by mouth daily as needed (for congestion).     nortriptyline 10 MG capsule  Commonly known as:  PAMELOR  1 to 2 at bedtime for burning in feet     ONE TOUCH ULTRA TEST test strip  Generic drug:  glucose blood  FOLLOW PACKAGE DIRECTIONS TO TEST BLOOD SUGAR TWICE DAILY AS NEEDED DUE TO FLUCTUATING SUGARS     potassium chloride 10 MEQ tablet  Commonly known as:  K-DUR  TAKE 2 TABLETS BY MOUTH ONCE DAILY.     traZODone 50 MG  tablet  Commonly known as:  DESYREL  1 qhs     warfarin 5 MG tablet  Commonly known as:  COUMADIN  Take 1 tablet (5 mg total) by mouth daily. TAKE AS DIRECTED BY PHYSICIAN       Allergies  Allergen Reactions  . Ambien [Zolpidem Tartrate] Other (See Comments)    Sleep walks  . Cholestatin   . Lipitor [Atorvastatin Calcium] Other (See Comments)    myalgias  . Ranitidine Other (See Comments)    Chest discomfort  . Simvastatin Other (See Comments)    Myalgias  . Xanax Xr [Alprazolam Er]     Tightness in chest       Follow-up Information    Follow up with Sallee Lange, MD. Schedule an appointment as soon as possible for a visit in 2 weeks.   Specialty:  Family Medicine   Contact information:   [address] [place] [address] [phone]        The results of significant diagnostics from this hospitalization (including imaging, microbiology, ancillary and laboratory) are listed below for reference.    Significant Diagnostic Studies: Dg Chest Port 1 View  08/20/2015   CLINICAL DATA:  Lightheadedness, dyspnea and central chest tightness of several weeks duration.  EXAM: PORTABLE CHEST 1 VIEW  COMPARISON:  04/1913  FINDINGS: A single AP portable view of the chest demonstrates no focal airspace consolidation or alveolar edema. The lungs are grossly clear. There is no large effusion or pneumothorax. Cardiac and mediastinal contours appear unremarkable.  IMPRESSION: No active disease.   Electronically Signed   By: Andreas Newport M.D.   On: 08/20/2015 18:48    Microbiology: No results found for this or any previous visit (from the past 240 hour(s)).   Labs: Basic Metabolic Panel:  Recent Labs Lab 08/20/15 1745 08/21/15 0552  NA 135 138  K 4.3 4.6  CL 98* 106  CO2 26 27  GLUCOSE 113* 163*  BUN 18 17  CREATININE 1.32* 1.03  CALCIUM 9.1 8.1*   Liver Function Tests:  Recent Labs Lab 08/20/15 1745  AST 23  ALT 27  ALKPHOS 73  BILITOT 0.8  PROT  8.2*  ALBUMIN 4.3   No results for input(s): LIPASE, AMYLASE in the last 168 hours. No results for input(s): AMMONIA in the last 168 hours. CBC:  Recent Labs Lab 08/20/15 1745 08/21/15 0552  WBC 8.2 8.0  NEUTROABS 5.7  --   HGB 17.2* 14.7  HCT 48.1 43.1  MCV 89.1 90.4  PLT 247 212   Cardiac Enzymes:  Recent Labs Lab 08/20/15 1745  TROPONINI <0.03   BNP: BNP (last 3 results)  Recent Labs  08/20/15 1745  BNP 41.0  ProBNP (last 3 results) No results for input(s): PROBNP in the last 8760 hours.  CBG:  Recent Labs Lab 08/20/15 2231 08/21/15 0740  GLUCAP 157* 185*       Signed:  HERNANDEZ ACOSTA,ESTELA  [facility] Pager: [phone] 08/21/2015, 10:19 AM

## 2015-08-21 NOTE — Progress Notes (Signed)
Discharge instructions given on medications,and follow up visits,patient verbalized understanding. Vital signs stable.No c/o pain or discomfort noted. Accompanied by staff to an awaiting vehicle.. 

## 2015-08-21 NOTE — Care Management Note (Signed)
Case Management Note  Patient Details  Name: BLAIZE EPPLE MRN: [id number] Date of Birth: 01-22-1932  Expected Discharge Date:    08/21/2015              Expected Discharge Plan:  [place]  In-House Referral:  NA  Discharge planning Services  CM Consult  Post Acute Care Choice:  Home Health Choice offered to:  Patient  DME Arranged:    DME Agency:     HH Arranged:  RN [place] Agency:  [place]  Status of Service:  Completed, signed off  Medicare Important Message Given:    Date Medicare IM Given:    Medicare IM give by:    Date Additional Medicare IM Given:    Additional Medicare Important Message give by:     If discussed at [place] of Stay Meetings, dates discussed:    Additional Comments: Pt is from home, lives with his wife and is ind at baseline. Pt admitted with ortho hypotension. Pt says his mobility has been getting worse and he is in the process of getting a nurse to come work with him at home. Attending has ordered [facility] services and pt would like [facility] as that is who is wife uses. Kyla Balzarine, of [place], made aware of referral and will obtain pt info from chart. Pt discharged home today. Pt aware HH has 48 hours to make first visit. No further CM needs.  Sherald Barge, RN 08/21/2015, 12:15 PM

## 2015-08-21 NOTE — Progress Notes (Signed)
ANTICOAGULATION CONSULT NOTE - Initial Consult  Pharmacy Consult for Coumadin (chronic Rx PTA) Indication: atrial fibrillation  Allergies  Allergen Reactions  . Ambien [Zolpidem Tartrate] Other (See Comments)    Sleep walks  . Cholestatin   . Lipitor [Atorvastatin Calcium] Other (See Comments)    myalgias  . Ranitidine Other (See Comments)    Chest discomfort  . Simvastatin Other (See Comments)    Myalgias  . Xanax Xr [Alprazolam Er]     Tightness in chest   Patient Measurements: Height: 6' 1.5" (186.7 cm) Weight: 243 lb (110.224 kg) IBW/kg (Calculated) : 81.05  Vital Signs: Temp: 98.5 F (36.9 C) (10/07 0522) Temp Source: Oral (10/07 0522) BP: 145/91 mmHg (10/06 2225) Pulse Rate: 81 (10/06 2225)  Labs:  Recent Labs  08/20/15 1745 08/21/15 0552  HGB 17.2* 14.7  HCT 48.1 43.1  PLT 247 212  LABPROT 23.3*  --   INR 2.08*  --   CREATININE 1.32* 1.03  TROPONINI <0.03  --    Estimated Creatinine Clearance: 73.8 mL/min (by C-G formula based on Cr of 1.03).  Medical History: Past Medical History  Diagnosis Date  . Hypertension     Borderline  . Cerebrovascular disease 07/2010    TIA; carotid ultrasound in 07/2010-significant bilateral plaque without focal internal carotid artery stenosis; MRI -encephalomalacia left temporal and right temporal lobes; small inferior right cerebellar infarct; small vessel disease  . Hyperlipidemia     adverse reactions to statins and niacin  . History of noncompliance with medical treatment   . Aortic stenosis 2007    Very mild  . Hepatic steatosis   . Diabetes mellitus     no insulin; A1c of 6.6 in 2005  . Atrial fibrillation (HCC)     Paroxysmal; Echocardiogram in 2007-normal EF; mild LVH; left atrial enlargement; mild stenosis and minimal AI; negative stress nuclear study in 2008  . Exertional dyspnea   . Degenerative joint disease     feet and legs  . Dizziness     occurs daily,especially in am  . Gastroesophageal reflux  disease   . Renal insufficiency   . Asthma   . Irregular heartbeat   . Peripheral vascular disease ([place])   . Temporal arteritis (HCC)     Medications:  Prescriptions prior to admission  Medication Sig Dispense Refill Last Dose  . albuterol (PROVENTIL HFA;VENTOLIN HFA) 108 (90 BASE) MCG/ACT inhaler Inhale 2 puffs into the lungs every 4 (four) hours as needed. 18 g 0 unknown  . furosemide (LASIX) 20 MG tablet TAKE 1/2 TABLET IN MORNING FOR BLOOD PRESSURE & FLUID. 15 tablet 0 08/20/2015 at Unknown time  . glipiZIDE (GLUCOTROL) 5 MG tablet Take one tablet qam and one half tablets at supper (Patient taking differently: Take 2.5-5 mg by mouth 2 (two) times daily. Take one tablet qam and one half tablets at supper) 135 tablet 1 08/20/2015 at Unknown time  . guaifenesin (MUCUS RELIEF) 400 MG TABS tablet Take 400 mg by mouth daily as needed (for congestion).   08/20/2015 at Unknown time  . HYDROcodone-acetaminophen (NORCO/VICODIN) 5-325 MG per tablet Take 1 tablet by mouth 2 (two) times daily as needed for moderate pain. 60 tablet 0 08/19/2015 at Unknown time  . metFORMIN (GLUCOPHAGE) 500 MG tablet 1/2 tablet twice a day (Patient taking differently: Take 250 mg by mouth 2 (two) times daily. 1/2 tablet twice a day) 30 tablet 5 08/20/2015 at Unknown time  . metoprolol succinate (TOPROL-XL) 25 MG 24 hr tablet TAKE ONE  HALF TABLET DAILY FOR BLOOD PRESSURE. (Patient taking differently: Take 12.5 mg by mouth daily. TAKE ONE HALF TABLET DAILY FOR BLOOD PRESSURE.) 15 tablet 5 08/20/2015 at 1000a  . nortriptyline (PAMELOR) 10 MG capsule 1 to 2 at bedtime for burning in feet (Patient taking differently: Take 20 mg by mouth at bedtime. ) 60 capsule 3 08/19/2015 at Unknown time  . potassium chloride (K-DUR) 10 MEQ tablet TAKE 2 TABLETS BY MOUTH ONCE DAILY. 60 tablet 4 08/20/2015 at Unknown time  . traZODone (DESYREL) 50 MG tablet 1 qhs (Patient taking differently: Take 50 mg by mouth at bedtime. 1 qhs) 30 tablet 5 08/19/2015  at Unknown time  . warfarin (COUMADIN) 5 MG tablet Take 1 tablet (5 mg total) by mouth daily. TAKE AS DIRECTED BY PHYSICIAN (Patient taking differently: Take 2.5-5 mg by mouth every morning. Take one-half tablet on Mondays and Fridays and take one tablet on all other days) 30 tablet 5 08/20/2015 at [place]  . ONE TOUCH ULTRA TEST test strip FOLLOW PACKAGE DIRECTIONS TO TEST BLOOD SUGAR TWICE DAILY AS NEEDED DUE TO FLUCTUATING SUGARS 104 each 1 Taking    Assessment: 81yo male on chronic Coumadin PTA for h/o afib.  Home dose listed above.  INR therapeutic on admission.  Goal of Therapy:  INR 2-3 Monitor platelets by anticoagulation protocol: Yes   Plan:  Coumadin 2.5mg  po today x 1 (home dose) INR daily  Yousif Edelson A 08/21/2015,10:24 AM

## 2015-08-22 DIAGNOSIS — J45901 Unspecified asthma with (acute) exacerbation: Secondary | ICD-10-CM | POA: Diagnosis not present

## 2015-08-22 DIAGNOSIS — E1140 Type 2 diabetes mellitus with diabetic neuropathy, unspecified: Secondary | ICD-10-CM | POA: Diagnosis not present

## 2015-08-25 DIAGNOSIS — E1140 Type 2 diabetes mellitus with diabetic neuropathy, unspecified: Secondary | ICD-10-CM | POA: Diagnosis not present

## 2015-08-25 DIAGNOSIS — J45901 Unspecified asthma with (acute) exacerbation: Secondary | ICD-10-CM | POA: Diagnosis not present

## 2015-08-27 ENCOUNTER — Ambulatory Visit (INDEPENDENT_AMBULATORY_CARE_PROVIDER_SITE_OTHER): Payer: Medicare Other | Admitting: Family Medicine

## 2015-08-27 ENCOUNTER — Ambulatory Visit: Payer: Medicare Other

## 2015-08-27 ENCOUNTER — Encounter: Payer: Self-pay | Admitting: Family Medicine

## 2015-08-27 VITALS — BP 114/72 | Ht 73.0 in | Wt 247.0 lb

## 2015-08-27 DIAGNOSIS — Z7901 Long term (current) use of anticoagulants: Secondary | ICD-10-CM | POA: Diagnosis not present

## 2015-08-27 DIAGNOSIS — N289 Disorder of kidney and ureter, unspecified: Secondary | ICD-10-CM

## 2015-08-27 DIAGNOSIS — E1140 Type 2 diabetes mellitus with diabetic neuropathy, unspecified: Secondary | ICD-10-CM | POA: Diagnosis not present

## 2015-08-27 DIAGNOSIS — J45901 Unspecified asthma with (acute) exacerbation: Secondary | ICD-10-CM | POA: Diagnosis not present

## 2015-08-27 MED ORDER — ALBUTEROL SULFATE HFA 108 (90 BASE) MCG/ACT IN AERS: 2.0000 | INHALATION_SPRAY | RESPIRATORY_TRACT | Status: DC | PRN

## 2015-08-27 NOTE — Progress Notes (Signed)
   Subjective:    Patient ID: [name] [name], male    DOB: [birth year], 81 y.o.   MRN: [id number]  HPIFollow up ER visit.  Pt states he is feeling better.  Patient states his overall energy level is fair he takes his medicine as directed he is not having any low sugar spells. He was in the hospital was dehydrated. Calcium and potassium low discharged INR today 2.5.  Patient denies any fever or cough   Review of Systems Patient states energy is better still feels weak but not as bad denies dizziness nausea vomiting diarrhea    Objective:   Physical Exam  Heart is irregular but rate is controlled lungs are clear no crackles blood pressure laying sitting in approximately 108/64 extremities no edema skin warm dry abdomen soft      Assessment & Plan:  INR stable continue as is I doubt patient dehydrated currently Stop trazodone because of a.m. drowsiness Reduce nortriptyline 1 tablet at night Repeat metabolic 7 because of low calcium and low potassium

## 2015-08-28 DIAGNOSIS — E1140 Type 2 diabetes mellitus with diabetic neuropathy, unspecified: Secondary | ICD-10-CM | POA: Diagnosis not present

## 2015-08-28 DIAGNOSIS — J45901 Unspecified asthma with (acute) exacerbation: Secondary | ICD-10-CM | POA: Diagnosis not present

## 2015-08-28 LAB — BASIC METABOLIC PANEL
BUN / CREAT RATIO: 17 (ref 10–22)
BUN: 17 mg/dL (ref 8–27)
CO2: 24 mmol/L (ref 18–29)
CREATININE: 0.99 mg/dL (ref 0.76–1.27)
Calcium: 9 mg/dL (ref 8.6–10.2)
Chloride: 95 mmol/L — ABNORMAL LOW (ref 97–108)
GFR calc Af Amer: 82 mL/min/{1.73_m2} (ref >59)
GFR, EST NON AFRICAN AMERICAN: 71 mL/min/{1.73_m2} (ref >59)
Glucose: 143 mg/dL — ABNORMAL HIGH (ref 65–99)
Potassium: 4.7 mmol/L (ref 3.5–5.2)
SODIUM: 136 mmol/L (ref 134–144)

## 2015-09-01 DIAGNOSIS — E1140 Type 2 diabetes mellitus with diabetic neuropathy, unspecified: Secondary | ICD-10-CM | POA: Diagnosis not present

## 2015-09-01 DIAGNOSIS — J45901 Unspecified asthma with (acute) exacerbation: Secondary | ICD-10-CM | POA: Diagnosis not present

## 2015-09-01 DIAGNOSIS — I10 Essential (primary) hypertension: Secondary | ICD-10-CM | POA: Diagnosis not present

## 2015-09-01 DIAGNOSIS — I951 Orthostatic hypotension: Secondary | ICD-10-CM | POA: Diagnosis not present

## 2015-09-02 DIAGNOSIS — J45901 Unspecified asthma with (acute) exacerbation: Secondary | ICD-10-CM | POA: Diagnosis not present

## 2015-09-02 DIAGNOSIS — E1140 Type 2 diabetes mellitus with diabetic neuropathy, unspecified: Secondary | ICD-10-CM | POA: Diagnosis not present

## 2015-09-03 DIAGNOSIS — E1140 Type 2 diabetes mellitus with diabetic neuropathy, unspecified: Secondary | ICD-10-CM | POA: Diagnosis not present

## 2015-09-03 DIAGNOSIS — J45901 Unspecified asthma with (acute) exacerbation: Secondary | ICD-10-CM | POA: Diagnosis not present

## 2015-09-07 DIAGNOSIS — E1140 Type 2 diabetes mellitus with diabetic neuropathy, unspecified: Secondary | ICD-10-CM | POA: Diagnosis not present

## 2015-09-07 DIAGNOSIS — J45901 Unspecified asthma with (acute) exacerbation: Secondary | ICD-10-CM | POA: Diagnosis not present

## 2015-09-08 DIAGNOSIS — E1140 Type 2 diabetes mellitus with diabetic neuropathy, unspecified: Secondary | ICD-10-CM | POA: Diagnosis not present

## 2015-09-08 DIAGNOSIS — J45901 Unspecified asthma with (acute) exacerbation: Secondary | ICD-10-CM | POA: Diagnosis not present

## 2015-09-15 DIAGNOSIS — E1140 Type 2 diabetes mellitus with diabetic neuropathy, unspecified: Secondary | ICD-10-CM | POA: Diagnosis not present

## 2015-09-15 DIAGNOSIS — J45901 Unspecified asthma with (acute) exacerbation: Secondary | ICD-10-CM | POA: Diagnosis not present

## 2015-09-16 DIAGNOSIS — J45901 Unspecified asthma with (acute) exacerbation: Secondary | ICD-10-CM | POA: Diagnosis not present

## 2015-09-16 DIAGNOSIS — E1140 Type 2 diabetes mellitus with diabetic neuropathy, unspecified: Secondary | ICD-10-CM | POA: Diagnosis not present

## 2015-09-22 DIAGNOSIS — J45901 Unspecified asthma with (acute) exacerbation: Secondary | ICD-10-CM | POA: Diagnosis not present

## 2015-09-22 DIAGNOSIS — E1140 Type 2 diabetes mellitus with diabetic neuropathy, unspecified: Secondary | ICD-10-CM | POA: Diagnosis not present

## 2015-09-24 ENCOUNTER — Ambulatory Visit (INDEPENDENT_AMBULATORY_CARE_PROVIDER_SITE_OTHER): Payer: Medicare Other

## 2015-09-24 DIAGNOSIS — Z7901 Long term (current) use of anticoagulants: Secondary | ICD-10-CM | POA: Diagnosis not present

## 2015-09-24 LAB — POCT INR: INR: 2.7

## 2015-09-24 NOTE — Patient Instructions (Signed)
Take 1/2 tablet on Mondays and Friday and 1 whole tablet all other days.

## 2015-10-01 ENCOUNTER — Emergency Department (HOSPITAL_COMMUNITY): Payer: Medicare Other

## 2015-10-01 ENCOUNTER — Telehealth: Payer: Self-pay | Admitting: Family Medicine

## 2015-10-01 ENCOUNTER — Encounter (HOSPITAL_COMMUNITY): Payer: Self-pay

## 2015-10-01 ENCOUNTER — Emergency Department (HOSPITAL_COMMUNITY)
Admission: EM | Admit: 2015-10-01 | Discharge: 2015-10-01 | Disposition: A | Discharge status: Home / Self Care | Payer: Medicare Other | Attending: Emergency Medicine | Admitting: Emergency Medicine

## 2015-10-01 DIAGNOSIS — Z8673 Personal history of transient ischemic attack (TIA), and cerebral infarction without residual deficits: Secondary | ICD-10-CM | POA: Insufficient documentation

## 2015-10-01 DIAGNOSIS — Z9119 Patient's noncompliance with other medical treatment and regimen: Secondary | ICD-10-CM | POA: Diagnosis not present

## 2015-10-01 DIAGNOSIS — Z87891 Personal history of nicotine dependence: Secondary | ICD-10-CM | POA: Diagnosis not present

## 2015-10-01 DIAGNOSIS — E119 Type 2 diabetes mellitus without complications: Secondary | ICD-10-CM | POA: Diagnosis not present

## 2015-10-01 DIAGNOSIS — I480 Paroxysmal atrial fibrillation: Secondary | ICD-10-CM | POA: Insufficient documentation

## 2015-10-01 DIAGNOSIS — Z87448 Personal history of other diseases of urinary system: Secondary | ICD-10-CM | POA: Insufficient documentation

## 2015-10-01 DIAGNOSIS — Z8719 Personal history of other diseases of the digestive system: Secondary | ICD-10-CM | POA: Diagnosis not present

## 2015-10-01 DIAGNOSIS — Z8739 Personal history of other diseases of the musculoskeletal system and connective tissue: Secondary | ICD-10-CM | POA: Diagnosis not present

## 2015-10-01 DIAGNOSIS — Z7901 Long term (current) use of anticoagulants: Secondary | ICD-10-CM | POA: Diagnosis not present

## 2015-10-01 DIAGNOSIS — Z79899 Other long term (current) drug therapy: Secondary | ICD-10-CM | POA: Insufficient documentation

## 2015-10-01 DIAGNOSIS — R42 Dizziness and giddiness: Secondary | ICD-10-CM | POA: Diagnosis not present

## 2015-10-01 DIAGNOSIS — R531 Weakness: Secondary | ICD-10-CM | POA: Diagnosis not present

## 2015-10-01 DIAGNOSIS — J45909 Unspecified asthma, uncomplicated: Secondary | ICD-10-CM | POA: Diagnosis not present

## 2015-10-01 LAB — I-STAT TROPONIN, ED: Troponin i, poc: 0 ng/mL (ref 0.00–0.08)

## 2015-10-01 LAB — COMPREHENSIVE METABOLIC PANEL
ALBUMIN: 3.8 g/dL (ref 3.5–5.0)
ALT: 35 U/L (ref 17–63)
AST: 28 U/L (ref 15–41)
Alkaline Phosphatase: 66 U/L (ref 38–126)
Anion gap: 9 (ref 5–15)
BUN: 18 mg/dL (ref 6–20)
CHLORIDE: 99 mmol/L — AB (ref 101–111)
CO2: 25 mmol/L (ref 22–32)
CREATININE: 1.06 mg/dL (ref 0.61–1.24)
Calcium: 9 mg/dL (ref 8.9–10.3)
GFR calc non Af Amer: >60 mL/min (ref >60)
GLUCOSE: 188 mg/dL — AB (ref 65–99)
Potassium: 4.4 mmol/L (ref 3.5–5.1)
SODIUM: 133 mmol/L — AB (ref 135–145)
Total Bilirubin: 0.7 mg/dL (ref 0.3–1.2)
Total Protein: 7.4 g/dL (ref 6.5–8.1)

## 2015-10-01 LAB — CBC WITH DIFFERENTIAL/PLATELET
BASOS ABS: 0.1 10*3/uL (ref 0.0–0.1)
BASOS PCT: 1 %
EOS ABS: 0.2 10*3/uL (ref 0.0–0.7)
EOS PCT: 3 %
HCT: 43.9 % (ref 39.0–52.0)
Hemoglobin: 15.3 g/dL (ref 13.0–17.0)
LYMPHS ABS: 1.2 10*3/uL (ref 0.7–4.0)
LYMPHS PCT: 16 %
MCH: 31.2 pg (ref 26.0–34.0)
MCHC: 34.9 g/dL (ref 30.0–36.0)
MCV: 89.4 fL (ref 78.0–100.0)
Monocytes Absolute: 0.7 10*3/uL (ref 0.1–1.0)
Monocytes Relative: 10 %
NEUTROS ABS: 5.2 10*3/uL (ref 1.7–7.7)
NEUTROS PCT: 70 %
PLATELETS: 210 10*3/uL (ref 150–400)
RBC: 4.91 MIL/uL (ref 4.22–5.81)
RDW: 13.1 % (ref 11.5–15.5)
WBC: 7.3 10*3/uL (ref 4.0–10.5)

## 2015-10-01 LAB — CBG MONITORING, ED: Glucose-Capillary: 188 mg/dL — ABNORMAL HIGH (ref 65–99)

## 2015-10-01 LAB — PROTIME-INR
INR: 2.51 — ABNORMAL HIGH (ref 0.00–1.49)
PROTHROMBIN TIME: 26.8 s — AB (ref 11.6–15.2)

## 2015-10-01 MED ORDER — SODIUM CHLORIDE 0.9 % IV BOLUS (SEPSIS)
500.0000 mL | Freq: Once | INTRAVENOUS | Status: AC
  Administered 2015-10-01: 500 mL via INTRAVENOUS

## 2015-10-01 NOTE — ED Notes (Signed)
Pt reports dizziness x 2 weeks and unsteady on feet.  Denies any pain at this time.

## 2015-10-01 NOTE — Discharge Instructions (Signed)
Drink plenty of fluids. And follow up with your md next week.

## 2015-10-01 NOTE — ED Provider Notes (Signed)
CSN: [id number]     Arrival date & time 10/01/15  1424 History   First MD Initiated Contact with Patient 10/01/15 1506     Chief Complaint  Patient presents with  . Dizziness     (Consider location/radiation/quality/duration/timing/severity/associated sxs/prior Treatment) Patient is a 81 y.o. male presenting with dizziness. The history is provided by the patient (The patient states that he feels a little bit dizzy last month but worse over the last week. He has had similar episodes of dizziness that was treated with fluids and improved. He stated that he has been dehydrated before).  Dizziness Quality:  Lightheadedness Severity:  Mild Onset quality:  Gradual Timing:  Intermittent Progression:  Waxing and waning Chronicity:  Recurrent Context: not with ear pain   Relieved by:  Nothing Exacerbated by: standing. Ineffective treatments:  None tried Associated symptoms: no blood in stool, no chest pain, no diarrhea and no headaches   Risk factors: no anemia     Past Medical History  Diagnosis Date  . Hypertension     Borderline  . Cerebrovascular disease 07/2010    TIA; carotid ultrasound in 07/2010-significant bilateral plaque without focal internal carotid artery stenosis; MRI -encephalomalacia left temporal and right temporal lobes; small inferior right cerebellar infarct; small vessel disease  . Hyperlipidemia     adverse reactions to statins and niacin  . History of noncompliance with medical treatment   . Aortic stenosis 2007    Very mild  . Hepatic steatosis   . Diabetes mellitus     no insulin; A1c of 6.6 in 2005  . Atrial fibrillation (HCC)     Paroxysmal; Echocardiogram in 2007-normal EF; mild LVH; left atrial enlargement; mild stenosis and minimal AI; negative stress nuclear study in 2008  . Exertional dyspnea   . Degenerative joint disease     feet and legs  . Dizziness     occurs daily,especially in am  . Gastroesophageal reflux disease   . Renal insufficiency    . Asthma   . Irregular heartbeat   . Peripheral vascular disease ([place])   . Temporal arteritis [facility])    Past Surgical History  Procedure Laterality Date  . Rotator cuff repair      Right  . Lipoma excision  1980  . Urethral stricture dilatation  1980s  . Orif ankle fracture  2000    Right  . Colonoscopy  2002  . Transurethral resection of prostate  09/2011  . Colonoscopy  01/19/2012    Procedure: COLONOSCOPY;  Surgeon: Rogene Houston, MD;  Location: [facility];  Service: Endoscopy;  Laterality: N/A;  1030  . Prostate surgery  12/2011   Family History  Problem Relation Age of Onset  . Stroke Mother   . Diabetes Father   . Colon cancer Neg Hx    Social History  Substance Use Topics  . Smoking status: Former Smoker -- 1.00 packs/day for 20 years    Types: Cigarettes    Quit date: 04/25/1992  . Smokeless tobacco: Current User    Types: Chew  . Alcohol Use: No    Review of Systems  Constitutional: Negative for appetite change and fatigue.  HENT: Negative for congestion, ear discharge and sinus pressure.   Eyes: Negative for discharge.  Respiratory: Negative for cough.   Cardiovascular: Negative for chest pain.  Gastrointestinal: Negative for abdominal pain, diarrhea and blood in stool.  Genitourinary: Negative for frequency and hematuria.  Musculoskeletal: Negative for back pain.  Skin: Negative for rash.  Neurological: Positive for dizziness. Negative for seizures and headaches.  Psychiatric/Behavioral: Negative for hallucinations.      Allergies  Ambien; Cholestatin; Lipitor; Ranitidine; Simvastatin; and Xanax xr  Home Medications   Prior to Admission medications   Medication Sig Start Date End Date Taking? Authorizing Provider  albuterol (PROVENTIL HFA;VENTOLIN HFA) 108 (90 BASE) MCG/ACT inhaler Inhale 2 puffs into the lungs every 4 (four) hours as needed. Patient taking differently: Inhale 2 puffs into the lungs every 4 (four) hours as needed for wheezing  or shortness of breath.  08/27/15  Yes Kathyrn Drown, MD  furosemide (LASIX) 20 MG tablet TAKE 1/2 TABLET IN MORNING FOR BLOOD PRESSURE & FLUID. 07/16/15  Yes Kathyrn Drown, MD  glipiZIDE (GLUCOTROL) 5 MG tablet Take one tablet qam and one half tablets at supper Patient taking differently: Take 2.5-5 mg by mouth 2 (two) times daily. Take one tablet qam and one half tablets at supper 07/08/15  Yes Kathyrn Drown, MD  HYDROcodone-acetaminophen (NORCO/VICODIN) 5-325 MG per tablet Take 1 tablet by mouth 2 (two) times daily as needed for moderate pain. 06/29/15  Yes Kathyrn Drown, MD  metFORMIN (GLUCOPHAGE) 500 MG tablet 1/2 tablet twice a day Patient taking differently: Take 250 mg by mouth 2 (two) times daily. 1/2 tablet twice a day 06/29/15  Yes Kathyrn Drown, MD  metoprolol succinate (TOPROL-XL) 25 MG 24 hr tablet TAKE ONE HALF TABLET DAILY FOR BLOOD PRESSURE. Patient taking differently: Take 12.5 mg by mouth daily. TAKE ONE HALF TABLET DAILY FOR BLOOD PRESSURE. 06/29/15  Yes Kathyrn Drown, MD  nortriptyline (PAMELOR) 10 MG capsule 1 to 2 at bedtime for burning in feet Patient taking differently: Take 20 mg by mouth at bedtime.  06/29/15  Yes Kathyrn Drown, MD  potassium chloride (K-DUR) 10 MEQ tablet TAKE 2 TABLETS BY MOUTH ONCE DAILY. 07/03/15  Yes Kathyrn Drown, MD  traZODone (DESYREL) 50 MG tablet 1 qhs Patient taking differently: Take 50 mg by mouth at bedtime. 1 qhs 06/29/15  Yes Kathyrn Drown, MD  warfarin (COUMADIN) 5 MG tablet Take 1 tablet (5 mg total) by mouth daily. TAKE AS DIRECTED BY PHYSICIAN Patient taking differently: Take 2.5-5 mg by mouth every morning. Take one-half tablet on Mondays and Fridays and take one tablet on all other days 06/29/15  Yes Kathyrn Drown, MD  ONE TOUCH ULTRA TEST test strip FOLLOW PACKAGE DIRECTIONS TO TEST BLOOD SUGAR TWICE DAILY AS NEEDED DUE TO FLUCTUATING SUGARS 11/15/13   Scott A Luking, MD   BP 120/70 mmHg  Pulse 85  Temp(Src) 98 F (36.7 C) (Oral)   Resp 14  Ht 6\' 1"  (1.854 m)  Wt 240 lb (108.863 kg)  BMI 31.67 kg/m2  SpO2 99% Physical Exam  Constitutional: He is oriented to person, place, and time. He appears well-developed.  HENT:  Head: Normocephalic.  Eyes: Conjunctivae and EOM are normal. No scleral icterus.  Neck: Neck supple. No thyromegaly present.  Cardiovascular: Normal rate and regular rhythm.  Exam reveals no gallop and no friction rub.   No murmur heard. Pulmonary/Chest: No stridor. He has no wheezes. He has no rales. He exhibits no tenderness.  Abdominal: He exhibits no distension. There is no tenderness. There is no rebound.  Musculoskeletal: Normal range of motion. He exhibits no edema.  Lymphadenopathy:    He has no cervical adenopathy.  Neurological: He is oriented to person, place, and time. He exhibits normal muscle tone. Coordination normal.  Skin: No  rash noted. No erythema.  Psychiatric: He has a normal mood and affect. His behavior is normal.    ED Course  Procedures (including critical care time) Labs Review Labs Reviewed  COMPREHENSIVE METABOLIC PANEL - Abnormal; Notable for the following:    Sodium 133 (*)    Chloride 99 (*)    Glucose, Bld 188 (*)    All other components within normal limits  PROTIME-INR - Abnormal; Notable for the following:    Prothrombin Time 26.8 (*)    INR 2.51 (*)    All other components within normal limits  CBG MONITORING, ED - Abnormal; Notable for the following:    Glucose-Capillary 188 (*)    All other components within normal limits  CBC WITH DIFFERENTIAL/PLATELET  Randolm Idol, ED    Imaging Review Dg Chest 2 View  10/01/2015  CLINICAL DATA:  Generalized weakness, dizziness EXAM: CHEST  2 VIEW COMPARISON:  08/20/2015 FINDINGS: Lungs are clear.  No pleural effusion or pneumothorax. The heart is normal in size. Visualized osseous structures are within normal limits. IMPRESSION: No evidence of acute cardiopulmonary disease. Electronically Signed   By:  Julian Hy M.D.   On: 10/01/2015 16:58   Ct Head Wo Contrast  10/01/2015  CLINICAL DATA:  Dizziness x 2 weeks/ unsteady on feet Hx HTN, DM, renal insufficiency EXAM: CT HEAD WITHOUT CONTRAST TECHNIQUE: Contiguous axial images were obtained from the base of the skull through the vertex without intravenous contrast. COMPARISON:  04/03/2012 FINDINGS: There is mild central and cortical atrophy. There is no intra or extra-axial fluid collection or mass lesion. There is encephalomalacia in the inferior right temporal lobe consistent with old infarct. Punctate lacunar infarcts identified within the right cerebellum and internal capsule the left basal ganglia. The basilar cisterns and ventricles have a normal appearance. There is no CT evidence for acute infarction or hemorrhage. Bone windows show atherosclerotic calcification of the internal carotid arteries. No acute abnormality. IMPRESSION: 1. Atrophy. 2. Old infarcts involving the left basal ganglia, right cerebellum, and right temporal lobe appear stable. 3.  No evidence for acute intracranial abnormality. Electronically Signed   By: Nolon Nations M.D.   On: 10/01/2015 16:30   I have personally reviewed and evaluated these images and lab results as part of my medical decision-making.   EKG Interpretation None      MDM   Final diagnoses:  Dizzy    Patient mildly orthostatic initially. Patient was given 500 mL of fluids. Patient was able to stand without any dizziness at all. Labs unremarkable CT scan shows no acute process. Patient will be discharged home and told to drink more fluids and follow-up with PCP next week    Milton Ferguson, MD 10/01/15 1747

## 2015-10-01 NOTE — Telephone Encounter (Signed)
Patient called in stating that he was having near syncope spells, and extreme dizziness upon standing. Patient stated that his blood sugars have been running 270, 280, 288. Discussed symptoms in real time with Dr.Scott Luking and was told to inform patient to go to the ER for further evaluation as soon as possible. Patient verbalized understanding.

## 2015-10-05 ENCOUNTER — Other Ambulatory Visit: Payer: Self-pay | Admitting: Family Medicine

## 2015-10-06 ENCOUNTER — Encounter: Payer: Self-pay | Admitting: Family Medicine

## 2015-10-06 ENCOUNTER — Ambulatory Visit (INDEPENDENT_AMBULATORY_CARE_PROVIDER_SITE_OTHER): Payer: Medicare Other | Admitting: Family Medicine

## 2015-10-06 VITALS — BP 130/80 | Ht 73.0 in | Wt 250.1 lb

## 2015-10-06 DIAGNOSIS — I480 Paroxysmal atrial fibrillation: Secondary | ICD-10-CM

## 2015-10-06 DIAGNOSIS — I10 Essential (primary) hypertension: Secondary | ICD-10-CM | POA: Diagnosis not present

## 2015-10-06 DIAGNOSIS — E1142 Type 2 diabetes mellitus with diabetic polyneuropathy: Secondary | ICD-10-CM | POA: Diagnosis not present

## 2015-10-06 DIAGNOSIS — N289 Disorder of kidney and ureter, unspecified: Secondary | ICD-10-CM | POA: Diagnosis not present

## 2015-10-06 LAB — POCT GLYCOSYLATED HEMOGLOBIN (HGB A1C): Hemoglobin A1C: 7.2

## 2015-10-06 MED ORDER — POTASSIUM CHLORIDE ER 10 MEQ PO TBCR: EXTENDED_RELEASE_TABLET | ORAL | Status: DC

## 2015-10-06 NOTE — Progress Notes (Signed)
   Subjective:    Patient ID: [name] [name], male    DOB: [birth year], 81 y.o.   MRN: [id number]  Dizziness This is a recurrent problem. The current episode started 1 to 4 weeks ago. The problem occurs 2 to 4 times per day. Pertinent negatives include no abdominal pain, chest pain, congestion, coughing, fatigue or weakness. The symptoms are aggravated by standing and walking. He has tried nothing for the symptoms.   Patient recently seen on November 17th. Patient stated that at ER was told he had orthostatic hypotension.   This patient has chronic dizziness. He denies any unilateral numbness or weakness. States his energy level overall is doing okay. Patient denies chest tightness pressure pain. Patient had difficult time tending for himself at home. Has difficult time fixing food. Sometimes gets confused about his medications if the not set out for him. Has difficult time bathing himself on a regular basis. Difficult time with food preparation and at times has difficult times getting dressed.    Review of Systems  Constitutional: Negative for activity change, appetite change and fatigue.  HENT: Negative for congestion.   Respiratory: Negative for cough.   Cardiovascular: Negative for chest pain.  Gastrointestinal: Negative for abdominal pain.  Endocrine: Negative for polydipsia and polyphagia.  Neurological: Positive for dizziness. Negative for weakness.  Psychiatric/Behavioral: Negative for confusion.       Objective:   Physical Exam  Constitutional: He appears well-nourished. No distress.  Cardiovascular: Normal rate and normal heart sounds.   No murmur heard. Pulmonary/Chest: Effort normal and breath sounds normal. No respiratory distress.  Musculoskeletal: He exhibits no edema.  Lymphadenopathy:    He has no cervical adenopathy.  Neurological: He is alert.  Psychiatric: His behavior is normal.  Vitals reviewed.         Assessment & Plan:   25 minutes spent in patient  greater than half in discussion with him and his caretaker family member   Orthostatic hypotension he is to take care in getting up. He is also to keep himself well-hydrated Renal insufficiency check metabolic 7 Diabetes fair control we went over his medicine regimen continue this patient cannot afford other medication he is doing well on the glipizide  Anticoagulation no bleeding issues they will consider possibly switching to one of the newer anticoagulants but currently right now 1 stick with the current one because of cost    monthly INR   follow-up 3 months  This patient would benefit from having in-home aide. Because of his advancing age frailty weakness and poor stamina combined with peripheral neuropathy diabetes hypertension and arthritis this patient would benefit from having aide to help with dressing, bathing, food preparation. I believe the patient can self administer medications as long as his medicines are set out for him or put in bubble packs.  Long discussion held regarding anticoagulants patient at this point in time would like to stick with Coumadin because of cost related issues no bleeding issues currently

## 2015-10-07 DIAGNOSIS — E119 Type 2 diabetes mellitus without complications: Secondary | ICD-10-CM | POA: Diagnosis not present

## 2015-10-07 DIAGNOSIS — H25813 Combined forms of age-related cataract, bilateral: Secondary | ICD-10-CM | POA: Diagnosis not present

## 2015-10-07 DIAGNOSIS — H353131 Nonexudative age-related macular degeneration, bilateral, early dry stage: Secondary | ICD-10-CM | POA: Diagnosis not present

## 2015-10-07 DIAGNOSIS — H02834 Dermatochalasis of left upper eyelid: Secondary | ICD-10-CM | POA: Diagnosis not present

## 2015-10-07 DIAGNOSIS — H02831 Dermatochalasis of right upper eyelid: Secondary | ICD-10-CM | POA: Diagnosis not present

## 2015-10-07 LAB — BASIC METABOLIC PANEL
BUN / CREAT RATIO: 19 (ref 10–22)
BUN: 17 mg/dL (ref 8–27)
CO2: 22 mmol/L (ref 18–29)
Calcium: 9.6 mg/dL (ref 8.6–10.2)
Chloride: 96 mmol/L — ABNORMAL LOW (ref 97–106)
Creatinine, Ser: 0.89 mg/dL (ref 0.76–1.27)
GFR calc Af Amer: 93 mL/min/{1.73_m2} (ref >59)
GFR calc non Af Amer: 80 mL/min/{1.73_m2} (ref >59)
GLUCOSE: 189 mg/dL — AB (ref 65–99)
POTASSIUM: 5.2 mmol/L (ref 3.5–5.2)
SODIUM: 136 mmol/L (ref 136–144)

## 2015-10-08 ENCOUNTER — Encounter: Payer: Self-pay | Admitting: Family Medicine

## 2015-10-22 ENCOUNTER — Ambulatory Visit: Payer: Medicare Other

## 2015-10-22 ENCOUNTER — Ambulatory Visit (INDEPENDENT_AMBULATORY_CARE_PROVIDER_SITE_OTHER): Payer: Medicare Other

## 2015-10-22 DIAGNOSIS — Z7901 Long term (current) use of anticoagulants: Secondary | ICD-10-CM | POA: Diagnosis not present

## 2015-10-22 LAB — POCT INR: INR: 2

## 2015-11-02 DIAGNOSIS — H25812 Combined forms of age-related cataract, left eye: Secondary | ICD-10-CM | POA: Diagnosis not present

## 2015-11-02 DIAGNOSIS — H2512 Age-related nuclear cataract, left eye: Secondary | ICD-10-CM | POA: Diagnosis not present

## 2015-11-05 ENCOUNTER — Other Ambulatory Visit: Payer: Self-pay | Admitting: Family Medicine

## 2015-11-05 NOTE — Telephone Encounter (Signed)
Ok in [place] name for six mo

## 2015-11-13 DIAGNOSIS — H2511 Age-related nuclear cataract, right eye: Secondary | ICD-10-CM | POA: Diagnosis not present

## 2015-11-19 ENCOUNTER — Ambulatory Visit (INDEPENDENT_AMBULATORY_CARE_PROVIDER_SITE_OTHER): Payer: Medicare Other

## 2015-11-19 DIAGNOSIS — Z7901 Long term (current) use of anticoagulants: Secondary | ICD-10-CM | POA: Diagnosis not present

## 2015-11-19 LAB — POCT INR: INR: 1.7

## 2015-11-23 DIAGNOSIS — H25811 Combined forms of age-related cataract, right eye: Secondary | ICD-10-CM | POA: Diagnosis not present

## 2015-11-23 DIAGNOSIS — H2511 Age-related nuclear cataract, right eye: Secondary | ICD-10-CM | POA: Diagnosis not present

## 2015-12-02 ENCOUNTER — Other Ambulatory Visit: Payer: Self-pay | Admitting: Family Medicine

## 2015-12-09 ENCOUNTER — Other Ambulatory Visit: Payer: Self-pay | Admitting: Family Medicine

## 2015-12-11 ENCOUNTER — Other Ambulatory Visit: Payer: Self-pay | Admitting: Family Medicine

## 2015-12-17 ENCOUNTER — Ambulatory Visit (INDEPENDENT_AMBULATORY_CARE_PROVIDER_SITE_OTHER): Payer: Medicare Other

## 2015-12-17 DIAGNOSIS — Z7901 Long term (current) use of anticoagulants: Secondary | ICD-10-CM

## 2015-12-17 LAB — POCT INR: INR: 2.9

## 2016-01-05 ENCOUNTER — Other Ambulatory Visit: Payer: Self-pay | Admitting: Family Medicine

## 2016-01-05 ENCOUNTER — Ambulatory Visit (HOSPITAL_COMMUNITY)
Admission: RE | Admit: 2016-01-05 | Discharge: 2016-01-05 | Disposition: A | Discharge status: Home / Self Care | Payer: Medicare Other | Source: Ambulatory Visit | Attending: Family Medicine | Admitting: Family Medicine

## 2016-01-05 ENCOUNTER — Ambulatory Visit (INDEPENDENT_AMBULATORY_CARE_PROVIDER_SITE_OTHER): Payer: Medicare Other | Admitting: Family Medicine

## 2016-01-05 ENCOUNTER — Encounter: Payer: Self-pay | Admitting: Family Medicine

## 2016-01-05 VITALS — BP 118/78 | Ht 73.0 in | Wt 250.0 lb

## 2016-01-05 DIAGNOSIS — E119 Type 2 diabetes mellitus without complications: Secondary | ICD-10-CM

## 2016-01-05 DIAGNOSIS — M545 Low back pain: Secondary | ICD-10-CM

## 2016-01-05 DIAGNOSIS — M4806 Spinal stenosis, lumbar region: Secondary | ICD-10-CM | POA: Diagnosis not present

## 2016-01-05 DIAGNOSIS — E785 Hyperlipidemia, unspecified: Secondary | ICD-10-CM | POA: Diagnosis not present

## 2016-01-05 DIAGNOSIS — M5136 Other intervertebral disc degeneration, lumbar region: Secondary | ICD-10-CM | POA: Diagnosis not present

## 2016-01-05 MED ORDER — HYDROCODONE-ACETAMINOPHEN 5-325 MG PO TABS: 1.0000 | ORAL_TABLET | Freq: Three times a day (TID) | ORAL | Status: DC | PRN

## 2016-01-05 MED ORDER — TRAZODONE HCL 100 MG PO TABS
100.0000 mg | ORAL_TABLET | Freq: Every day | ORAL | Status: DC
Start: 2016-01-05 — End: 2016-03-28

## 2016-01-05 MED ORDER — LISINOPRIL 2.5 MG PO TABS: 2.5000 mg | ORAL_TABLET | Freq: Every day | ORAL | Status: DC

## 2016-01-05 NOTE — Telephone Encounter (Signed)
May we refill Trazadone?

## 2016-01-05 NOTE — Progress Notes (Signed)
   Subjective:    Patient ID: [name] [name], male    DOB: 1932-10-08, 82 y.o.   MRN: [id number]  Hypertension This is a chronic problem. The current episode started more than 1 year ago. Risk factors for coronary artery disease include diabetes mellitus, obesity and male gender. Treatments tried: lasix. There are no compliance problems.     Trouble sleeping  This patient having trouble sleeping. But when talked to in detail he lays down takes him a while to go to sleep then various things will go on and then he finally goes to sleep around 3 AM and then he wakes up around 10 AM then the same thing repeats itself he denies any middle the night awakenings. He does try trazodone but doesn't help much.  and trouble getting around -trouble walking he states when he tries to get around he has low back pain and discomfort. He denies any injury to it. Does not wake him up at night but it does cause significant discomfort.  Patient has confusions regarding his medications. He uses bubble packs but he has a hard time understanding why he takes what he does. He denies any low sugars. He states compliance with his medications. He also takes nortriptyline at nighttime to help him with his neuropathy in his feet. Review of Systems   he denies chest tightness pressure pain shortness breath he relates low back pain denies abdominal pain denies dysuria hematuria. Denies low sugar spells. Objective:   Physical Exam Lungs are clear heart irregular rate controlled abdomen is soft extremities no edema neuropathy in the feet neurologic grossly normal patient is able to understand when things are broken down into straightforward terms because of this it does take longer with this patient greater than half the time was spent in discussion of these issues.        Incredibly nice man yet incredibly complex greater and have to time was spent discussing and reviewing with the patient various questions plus also  clarifications of medications. Greater than have to time spent 30-35 minutes spent with patient Assessment & Plan:   low back pain patient do some x-rays await the results of this Hyperlipidemia check lipid profile Diabetes fair control check A1c  high risk medication check liver profile High risk medications and renal insufficiency check metabolic 7  chronic pain-prescriptions given do not exceed 3 per day  we will be making changes in the medication  Start lisinopril 2.5 mg a day because of proteinuria Stop potassium  because a sleep issues increase trazodone to 100 mg   keep regular INR visits Follow-up here 2 months office visit We will send a noticed to his pharmacy regarding these medications

## 2016-01-05 NOTE — Telephone Encounter (Signed)
This patient has a office visit coming up please tell patient to bring all of his medications in the bottles

## 2016-01-08 DIAGNOSIS — M9902 Segmental and somatic dysfunction of thoracic region: Secondary | ICD-10-CM | POA: Diagnosis not present

## 2016-01-08 DIAGNOSIS — M5441 Lumbago with sciatica, right side: Secondary | ICD-10-CM | POA: Diagnosis not present

## 2016-01-08 DIAGNOSIS — M9905 Segmental and somatic dysfunction of pelvic region: Secondary | ICD-10-CM | POA: Diagnosis not present

## 2016-01-08 DIAGNOSIS — M9903 Segmental and somatic dysfunction of lumbar region: Secondary | ICD-10-CM | POA: Diagnosis not present

## 2016-01-08 NOTE — Telephone Encounter (Signed)
Please put metoprolol on the last have him continue it

## 2016-01-08 NOTE — Telephone Encounter (Signed)
Nurse's-on this particular patient the trazodone was increased to 100 mg said therefore that part is RD been addressed. As for the furosemide and metoprolol I believe that those were in his bubble pack but I do not remember that 100% please verify with [facility] regarding the metoprolol in the Lasix if this is in his bubble pack on a regular basis go ahead and refill these medications for 6 months thank you

## 2016-01-08 NOTE — Telephone Encounter (Signed)
See other notation. 

## 2016-01-08 NOTE — Telephone Encounter (Signed)
Called pharmacy and verified medications. Patient last got metoprolol and lasix filled on the 18th. Check patient's med list and I no longer see the metoprolol on med list. I see Lisinopril that was filled on 01/05/16 at patient's appointment with the 100 mg Trazodone. Please advise?

## 2016-01-11 ENCOUNTER — Telehealth: Payer: Self-pay | Admitting: Family Medicine

## 2016-01-11 DIAGNOSIS — M5441 Lumbago with sciatica, right side: Secondary | ICD-10-CM | POA: Diagnosis not present

## 2016-01-11 DIAGNOSIS — M9902 Segmental and somatic dysfunction of thoracic region: Secondary | ICD-10-CM | POA: Diagnosis not present

## 2016-01-11 DIAGNOSIS — M9903 Segmental and somatic dysfunction of lumbar region: Secondary | ICD-10-CM | POA: Diagnosis not present

## 2016-01-11 DIAGNOSIS — M9905 Segmental and somatic dysfunction of pelvic region: Secondary | ICD-10-CM | POA: Diagnosis not present

## 2016-01-11 NOTE — Telephone Encounter (Signed)
Discussed with heather at [facility]. They will correct the bubble packs as prescribed. Patient notified.

## 2016-01-11 NOTE — Telephone Encounter (Signed)
Pt was told by the Dr to stop taking the potassium. The pharmacy recently sent him refills on it. Pt wants someone to call the pharmacy and tell them that he is not suppose to get this medication anymore.    [facility] [place]

## 2016-01-13 ENCOUNTER — Telehealth: Payer: Self-pay | Admitting: Family Medicine

## 2016-01-13 DIAGNOSIS — Z85828 Personal history of other malignant neoplasm of skin: Secondary | ICD-10-CM | POA: Diagnosis not present

## 2016-01-13 DIAGNOSIS — M9903 Segmental and somatic dysfunction of lumbar region: Secondary | ICD-10-CM | POA: Diagnosis not present

## 2016-01-13 DIAGNOSIS — M9902 Segmental and somatic dysfunction of thoracic region: Secondary | ICD-10-CM | POA: Diagnosis not present

## 2016-01-13 DIAGNOSIS — M5441 Lumbago with sciatica, right side: Secondary | ICD-10-CM | POA: Diagnosis not present

## 2016-01-13 DIAGNOSIS — D225 Melanocytic nevi of trunk: Secondary | ICD-10-CM | POA: Diagnosis not present

## 2016-01-13 DIAGNOSIS — M9905 Segmental and somatic dysfunction of pelvic region: Secondary | ICD-10-CM | POA: Diagnosis not present

## 2016-01-13 DIAGNOSIS — X32XXXD Exposure to sunlight, subsequent encounter: Secondary | ICD-10-CM | POA: Diagnosis not present

## 2016-01-13 DIAGNOSIS — Z08 Encounter for follow-up examination after completed treatment for malignant neoplasm: Secondary | ICD-10-CM | POA: Diagnosis not present

## 2016-01-13 DIAGNOSIS — L570 Actinic keratosis: Secondary | ICD-10-CM | POA: Diagnosis not present

## 2016-01-13 NOTE — Telephone Encounter (Signed)
This form was completed. If additional information is needed please let me know. I certify that this patient did in fact need assistance with stated needs in regards to assisting with bathing and dressing. Patient's age along with chronic medical issues and progressive medical issues have brought him to the point where he is not able to do bathing or dressing

## 2016-01-13 NOTE — Telephone Encounter (Signed)
Please review and complete the Home Health Certification for patient in the yellow folder.  This is pertaining to the rehab he received from [facility] agency in his home from 08/22/15 through 09/22/2015.  [name] from [place] is requesting this be filled out and faxed back to him at [phone].

## 2016-01-14 ENCOUNTER — Ambulatory Visit: Payer: Medicare Other

## 2016-01-15 ENCOUNTER — Ambulatory Visit (INDEPENDENT_AMBULATORY_CARE_PROVIDER_SITE_OTHER): Payer: Medicare Other

## 2016-01-15 DIAGNOSIS — M5441 Lumbago with sciatica, right side: Secondary | ICD-10-CM | POA: Diagnosis not present

## 2016-01-15 DIAGNOSIS — M9905 Segmental and somatic dysfunction of pelvic region: Secondary | ICD-10-CM | POA: Diagnosis not present

## 2016-01-15 DIAGNOSIS — M9903 Segmental and somatic dysfunction of lumbar region: Secondary | ICD-10-CM | POA: Diagnosis not present

## 2016-01-15 DIAGNOSIS — Z7901 Long term (current) use of anticoagulants: Secondary | ICD-10-CM

## 2016-01-15 DIAGNOSIS — M9902 Segmental and somatic dysfunction of thoracic region: Secondary | ICD-10-CM | POA: Diagnosis not present

## 2016-01-15 LAB — POCT INR: INR: 2.9

## 2016-01-19 DIAGNOSIS — M5441 Lumbago with sciatica, right side: Secondary | ICD-10-CM | POA: Diagnosis not present

## 2016-01-19 DIAGNOSIS — M9902 Segmental and somatic dysfunction of thoracic region: Secondary | ICD-10-CM | POA: Diagnosis not present

## 2016-01-19 DIAGNOSIS — M9905 Segmental and somatic dysfunction of pelvic region: Secondary | ICD-10-CM | POA: Diagnosis not present

## 2016-01-19 DIAGNOSIS — M9903 Segmental and somatic dysfunction of lumbar region: Secondary | ICD-10-CM | POA: Diagnosis not present

## 2016-01-22 DIAGNOSIS — M9902 Segmental and somatic dysfunction of thoracic region: Secondary | ICD-10-CM | POA: Diagnosis not present

## 2016-01-22 DIAGNOSIS — M9905 Segmental and somatic dysfunction of pelvic region: Secondary | ICD-10-CM | POA: Diagnosis not present

## 2016-01-22 DIAGNOSIS — M9903 Segmental and somatic dysfunction of lumbar region: Secondary | ICD-10-CM | POA: Diagnosis not present

## 2016-01-22 DIAGNOSIS — M5441 Lumbago with sciatica, right side: Secondary | ICD-10-CM | POA: Diagnosis not present

## 2016-01-27 DIAGNOSIS — M9905 Segmental and somatic dysfunction of pelvic region: Secondary | ICD-10-CM | POA: Diagnosis not present

## 2016-01-27 DIAGNOSIS — M9902 Segmental and somatic dysfunction of thoracic region: Secondary | ICD-10-CM | POA: Diagnosis not present

## 2016-01-27 DIAGNOSIS — M9903 Segmental and somatic dysfunction of lumbar region: Secondary | ICD-10-CM | POA: Diagnosis not present

## 2016-01-27 DIAGNOSIS — M5441 Lumbago with sciatica, right side: Secondary | ICD-10-CM | POA: Diagnosis not present

## 2016-02-04 ENCOUNTER — Other Ambulatory Visit: Payer: Self-pay | Admitting: Family Medicine

## 2016-02-05 ENCOUNTER — Other Ambulatory Visit: Payer: Self-pay

## 2016-02-05 MED ORDER — WARFARIN SODIUM 5 MG PO TABS: 5.0000 mg | ORAL_TABLET | Freq: Every day | ORAL | Status: DC

## 2016-02-05 MED ORDER — METFORMIN HCL 500 MG PO TABS: ORAL_TABLET | ORAL | Status: DC

## 2016-02-05 MED ORDER — METOPROLOL SUCCINATE ER 25 MG PO TB24: ORAL_TABLET | ORAL | Status: DC

## 2016-02-05 MED ORDER — TRAZODONE HCL 100 MG PO TABS: 100.0000 mg | ORAL_TABLET | Freq: Every day | ORAL | Status: DC

## 2016-02-05 NOTE — Telephone Encounter (Signed)
What I have on Epic is correct. What is being sent to [place] is the old directions. Please make adjustments may have 6 month supply

## 2016-02-10 DIAGNOSIS — M9903 Segmental and somatic dysfunction of lumbar region: Secondary | ICD-10-CM | POA: Diagnosis not present

## 2016-02-10 DIAGNOSIS — M9902 Segmental and somatic dysfunction of thoracic region: Secondary | ICD-10-CM | POA: Diagnosis not present

## 2016-02-10 DIAGNOSIS — M5441 Lumbago with sciatica, right side: Secondary | ICD-10-CM | POA: Diagnosis not present

## 2016-02-10 DIAGNOSIS — M9905 Segmental and somatic dysfunction of pelvic region: Secondary | ICD-10-CM | POA: Diagnosis not present

## 2016-02-15 NOTE — Telephone Encounter (Signed)
I cannot tell if this has already been handled? Patient may have refills but basically upon what is stated in Epic

## 2016-02-16 ENCOUNTER — Ambulatory Visit (INDEPENDENT_AMBULATORY_CARE_PROVIDER_SITE_OTHER): Payer: Medicare Other

## 2016-02-16 DIAGNOSIS — Z7901 Long term (current) use of anticoagulants: Secondary | ICD-10-CM

## 2016-02-16 LAB — POCT INR: INR: 3.2

## 2016-02-16 NOTE — Patient Instructions (Signed)
Take one half tablet Tuesdays, and  Fridays and one tablet all other days. Recheck within 4 WEEKS

## 2016-02-26 ENCOUNTER — Other Ambulatory Visit: Payer: Self-pay | Admitting: Family Medicine

## 2016-02-29 ENCOUNTER — Ambulatory Visit: Payer: Medicare Other | Admitting: Family Medicine

## 2016-03-15 ENCOUNTER — Ambulatory Visit (INDEPENDENT_AMBULATORY_CARE_PROVIDER_SITE_OTHER): Payer: Medicare Other

## 2016-03-15 DIAGNOSIS — Z7901 Long term (current) use of anticoagulants: Secondary | ICD-10-CM | POA: Diagnosis not present

## 2016-03-15 LAB — POCT INR: INR: 1.9

## 2016-03-17 ENCOUNTER — Telehealth: Payer: Self-pay | Admitting: Family Medicine

## 2016-03-17 MED ORDER — HYDROCODONE-ACETAMINOPHEN 5-325 MG PO TABS: 1.0000 | ORAL_TABLET | Freq: Three times a day (TID) | ORAL | Status: DC | PRN

## 2016-03-17 NOTE — Telephone Encounter (Signed)
Patient requesting Rx for HYDROcodone-acetaminophen (NORCO/VICODIN) 5-325 MG tablet.  [place]

## 2016-03-17 NOTE — Telephone Encounter (Signed)
Patient may have refill 

## 2016-03-17 NOTE — Telephone Encounter (Signed)
[facility] 03/17/16 (script ready for pick up)

## 2016-03-18 NOTE — Telephone Encounter (Signed)
Spoke with patient's wife and informed her that patient's prescription is ready for pick up.

## 2016-03-22 ENCOUNTER — Telehealth: Payer: Self-pay | Admitting: Family Medicine

## 2016-03-22 NOTE — Telephone Encounter (Signed)
Error

## 2016-03-28 ENCOUNTER — Other Ambulatory Visit: Payer: Self-pay | Admitting: Family Medicine

## 2016-03-29 NOTE — Telephone Encounter (Signed)
May have 6 months on each 

## 2016-04-12 ENCOUNTER — Ambulatory Visit (HOSPITAL_COMMUNITY)
Admission: RE | Admit: 2016-04-12 | Discharge: 2016-04-12 | Disposition: A | Discharge status: Home / Self Care | Payer: Medicare Other | Source: Ambulatory Visit | Attending: Family Medicine | Admitting: Family Medicine

## 2016-04-12 ENCOUNTER — Ambulatory Visit (INDEPENDENT_AMBULATORY_CARE_PROVIDER_SITE_OTHER): Payer: Medicare Other | Admitting: Family Medicine

## 2016-04-12 ENCOUNTER — Encounter: Payer: Self-pay | Admitting: Family Medicine

## 2016-04-12 VITALS — BP 126/70 | Ht 73.0 in | Wt 233.1 lb

## 2016-04-12 DIAGNOSIS — D638 Anemia in other chronic diseases classified elsewhere: Secondary | ICD-10-CM | POA: Insufficient documentation

## 2016-04-12 DIAGNOSIS — R918 Other nonspecific abnormal finding of lung field: Secondary | ICD-10-CM | POA: Diagnosis not present

## 2016-04-12 DIAGNOSIS — I481 Persistent atrial fibrillation: Secondary | ICD-10-CM

## 2016-04-12 DIAGNOSIS — R0902 Hypoxemia: Secondary | ICD-10-CM | POA: Insufficient documentation

## 2016-04-12 DIAGNOSIS — R0602 Shortness of breath: Secondary | ICD-10-CM | POA: Diagnosis not present

## 2016-04-12 DIAGNOSIS — M549 Dorsalgia, unspecified: Secondary | ICD-10-CM

## 2016-04-12 DIAGNOSIS — G8929 Other chronic pain: Secondary | ICD-10-CM | POA: Insufficient documentation

## 2016-04-12 DIAGNOSIS — Z7901 Long term (current) use of anticoagulants: Secondary | ICD-10-CM

## 2016-04-12 DIAGNOSIS — E119 Type 2 diabetes mellitus without complications: Secondary | ICD-10-CM | POA: Insufficient documentation

## 2016-04-12 DIAGNOSIS — I4819 Other persistent atrial fibrillation: Secondary | ICD-10-CM

## 2016-04-12 DIAGNOSIS — R0609 Other forms of dyspnea: Secondary | ICD-10-CM | POA: Insufficient documentation

## 2016-04-12 DIAGNOSIS — R0600 Dyspnea, unspecified: Secondary | ICD-10-CM | POA: Diagnosis present

## 2016-04-12 LAB — POCT GLYCOSYLATED HEMOGLOBIN (HGB A1C): Hemoglobin A1C: 5.4

## 2016-04-12 LAB — POCT INR: INR: 2.1

## 2016-04-12 MED ORDER — GLIPIZIDE 5 MG PO TABS: ORAL_TABLET | ORAL | Status: DC

## 2016-04-12 NOTE — Progress Notes (Signed)
Subjective:    Patient ID: [name] [name], male    DOB: [birth year], 82 y.o.   MRN: [id number]  Diabetes He presents for his follow-up diabetic visit. He has type 2 diabetes mellitus. Hypoglycemia symptoms include dizziness. Pertinent negatives for hypoglycemia include no confusion. Associated symptoms include fatigue. Pertinent negatives for diabetes include no chest pain, no polydipsia, no polyphagia and no weakness. There are no hypoglycemic complications. There are no diabetic complications. There are no known risk factors for coronary artery disease. Current diabetic treatment includes oral agent (dual therapy). He is compliant with treatment all of the time.   Patient states that he needs daily assistance at home with his activities of daily living. Patient would like to talk with Dr. Nicki Reaper concerning this today.  This patient states that he is having difficult time getting dressed difficult time bathing has difficult time walking more than 50 feet at a time has to hold onto furniture has to use a cane this makes it very difficult for him to fix food also makes it difficult for him to be able to help his wife. Patient states that he needs a aid in the house to help him. He would prefer for this aid be present in the evening because he states he needs more help in the evening time. The patient states he's tried various ways to compensate and he is at a point that if he does not have a aid he will have no other choice but to go into a assisted living or possibly nursing home. Patient also has some mild cognitive issues that he would benefit from having someone help make sure that he maintains his medication. Results for orders placed or performed in visit on 04/12/16  POCT INR  Result Value Ref Range   INR 2.1   POCT glycosylated hemoglobin (Hb A1C)  Result Value Ref Range   Hemoglobin A1C 5.4    Review of Systems  Constitutional: Positive for fatigue. Negative for fever, activity change and  appetite change.  HENT: Negative for congestion.   Respiratory: Positive for cough and shortness of breath.   Cardiovascular: Negative for chest pain and leg swelling.  Gastrointestinal: Negative for abdominal pain.  Endocrine: Negative for polydipsia and polyphagia.  Musculoskeletal: Positive for back pain and arthralgias.  Neurological: Positive for dizziness. Negative for weakness.  Psychiatric/Behavioral: Negative for confusion.       Objective:   Physical Exam  Constitutional: He appears well-nourished. No distress.  Cardiovascular: Normal rate and normal heart sounds.   No murmur heard. Pulmonary/Chest: Effort normal and breath sounds normal. No respiratory distress.  Musculoskeletal: He exhibits no edema.  Lymphadenopathy:    He has no cervical adenopathy.  Neurological: He is alert.  Psychiatric: His behavior is normal.  Vitals reviewed.    Greater than 25 minutes spent with this patient discussing multiple different issues.     Assessment & Plan:  Diabetes to good control decrease glipizide to half tablet twice daily recheck A1c in 3 months  Significant fatigue and tiredness related to his age as well as multiple debilitating health issues patient unable to care for himself well. I believe his patient would benefit from having a in his house. The problem is he is hoping to get the aid from 4 PM until approximately 9:30 or 10 PM this might be difficult. He states he has insurance that should cover this in needs a letter from [place]. Certainly with his health related issues I support him getting a  aid. It will be up to him and his insurance agent to see if he qualifies or if a specific timeframe can be used  Chronic atrial fibrillation under decent control INR good no sign of any bleeding  Dyspnea with exertion chest x-ray ordered this could be related to cardiac  Chronic back pain-hydrocodone when necessary for pain patient will notify us when he needs pain  prescription  Relative hypoxia 91% O2 sat on room air contributing some to his fatigue and tiredness  History of anemia check CBC await results of his lab work  The patient was counseled that any driving he does should be local in daytime driving only no highway driving no distance driving.

## 2016-04-13 ENCOUNTER — Encounter: Payer: Self-pay | Admitting: Family Medicine

## 2016-04-13 DIAGNOSIS — R0902 Hypoxemia: Secondary | ICD-10-CM | POA: Diagnosis not present

## 2016-04-13 DIAGNOSIS — E119 Type 2 diabetes mellitus without complications: Secondary | ICD-10-CM | POA: Diagnosis not present

## 2016-04-13 DIAGNOSIS — Z7901 Long term (current) use of anticoagulants: Secondary | ICD-10-CM | POA: Diagnosis not present

## 2016-04-13 DIAGNOSIS — M549 Dorsalgia, unspecified: Secondary | ICD-10-CM | POA: Diagnosis not present

## 2016-04-13 DIAGNOSIS — I481 Persistent atrial fibrillation: Secondary | ICD-10-CM | POA: Diagnosis not present

## 2016-04-13 DIAGNOSIS — G8929 Other chronic pain: Secondary | ICD-10-CM | POA: Diagnosis not present

## 2016-04-13 DIAGNOSIS — D638 Anemia in other chronic diseases classified elsewhere: Secondary | ICD-10-CM | POA: Diagnosis not present

## 2016-04-13 DIAGNOSIS — R0609 Other forms of dyspnea: Secondary | ICD-10-CM | POA: Diagnosis not present

## 2016-04-13 MED ORDER — CEFPROZIL 250 MG PO TABS: ORAL_TABLET | ORAL | Status: DC

## 2016-04-14 LAB — CBC WITH DIFFERENTIAL/PLATELET
BASOS ABS: 0 10*3/uL (ref 0.0–0.2)
Basos: 1 %
EOS (ABSOLUTE): 0.1 10*3/uL (ref 0.0–0.4)
Eos: 2 %
HEMOGLOBIN: 15.2 g/dL (ref 12.6–17.7)
Hematocrit: 44.9 % (ref 37.5–51.0)
IMMATURE GRANS (ABS): 0 10*3/uL (ref 0.0–0.1)
IMMATURE GRANULOCYTES: 0 %
LYMPHS ABS: 0.5 10*3/uL — AB (ref 0.7–3.1)
LYMPHS: 10 %
MCH: 29.7 pg (ref 26.6–33.0)
MCHC: 33.9 g/dL (ref 31.5–35.7)
MCV: 88 fL (ref 79–97)
MONOCYTES: 7 %
Monocytes Absolute: 0.4 10*3/uL (ref 0.1–0.9)
NEUTROS PCT: 80 %
Neutrophils Absolute: 4.3 10*3/uL (ref 1.4–7.0)
Platelets: 197 10*3/uL (ref 150–379)
RBC: 5.11 x10E6/uL (ref 4.14–5.80)
RDW: 13.7 % (ref 12.3–15.4)
WBC: 5.3 10*3/uL (ref 3.4–10.8)

## 2016-04-14 LAB — BASIC METABOLIC PANEL
BUN/Creatinine Ratio: 11 (ref 10–24)
BUN: 10 mg/dL (ref 8–27)
CHLORIDE: 92 mmol/L — AB (ref 96–106)
CO2: 24 mmol/L (ref 18–29)
Calcium: 9.1 mg/dL (ref 8.6–10.2)
Creatinine, Ser: 0.93 mg/dL (ref 0.76–1.27)
GFR calc Af Amer: 89 mL/min/{1.73_m2} (ref >59)
GFR calc non Af Amer: 77 mL/min/{1.73_m2} (ref >59)
GLUCOSE: 159 mg/dL — AB (ref 65–99)
Potassium: 3.6 mmol/L (ref 3.5–5.2)
Sodium: 135 mmol/L (ref 134–144)

## 2016-04-14 LAB — LIPID PANEL
CHOL/HDL RATIO: 6.6 ratio — AB (ref 0.0–5.0)
Cholesterol, Total: 225 mg/dL — ABNORMAL HIGH (ref 100–199)
HDL: 34 mg/dL — AB (ref >39)
LDL Calculated: 165 mg/dL — ABNORMAL HIGH (ref 0–99)
Triglycerides: 128 mg/dL (ref 0–149)
VLDL Cholesterol Cal: 26 mg/dL (ref 5–40)

## 2016-04-14 LAB — HEPATIC FUNCTION PANEL
ALK PHOS: 75 IU/L (ref 39–117)
ALT: 20 IU/L (ref 0–44)
AST: 16 IU/L (ref 0–40)
Albumin: 4.3 g/dL (ref 3.5–4.7)
BILIRUBIN TOTAL: 1.4 mg/dL — AB (ref 0.0–1.2)
BILIRUBIN, DIRECT: 0.38 mg/dL (ref 0.00–0.40)
TOTAL PROTEIN: 6.9 g/dL (ref 6.0–8.5)

## 2016-04-15 ENCOUNTER — Encounter: Payer: Self-pay | Admitting: Family Medicine

## 2016-04-19 ENCOUNTER — Telehealth: Payer: Self-pay | Admitting: Family Medicine

## 2016-04-19 ENCOUNTER — Encounter: Payer: Self-pay | Admitting: Family Medicine

## 2016-04-19 ENCOUNTER — Ambulatory Visit: Payer: Medicare Other | Admitting: Family Medicine

## 2016-04-19 NOTE — Telephone Encounter (Signed)
This has been signed and is being forwarded to you thank you

## 2016-04-19 NOTE — Telephone Encounter (Signed)
Dr. Nicki Reaper,   [facility] called and needed BA's name corrected on letter that was given to him for request for an in home aid.  I have corrected this.  Can you please sign and fax to [phone]?

## 2016-04-20 ENCOUNTER — Ambulatory Visit (INDEPENDENT_AMBULATORY_CARE_PROVIDER_SITE_OTHER): Payer: Medicare Other | Admitting: *Deleted

## 2016-04-20 DIAGNOSIS — Z7901 Long term (current) use of anticoagulants: Secondary | ICD-10-CM

## 2016-04-20 LAB — POCT INR: INR: 1.8

## 2016-04-20 NOTE — Telephone Encounter (Signed)
Faxed

## 2016-04-29 ENCOUNTER — Telehealth: Payer: Self-pay | Admitting: Family Medicine

## 2016-04-29 ENCOUNTER — Other Ambulatory Visit: Payer: Self-pay | Admitting: *Deleted

## 2016-04-29 MED ORDER — PREDNISONE 20 MG PO TABS: ORAL_TABLET | ORAL | Status: DC

## 2016-04-29 MED ORDER — ALBUTEROL SULFATE HFA 108 (90 BASE) MCG/ACT IN AERS
2.0000 | INHALATION_SPRAY | RESPIRATORY_TRACT | Status: DC | PRN
Start: 2016-04-29 — End: 2016-05-28

## 2016-04-29 MED ORDER — LEVOFLOXACIN 500 MG PO TABS: 500.0000 mg | ORAL_TABLET | Freq: Every day | ORAL | Status: DC

## 2016-04-29 NOTE — Telephone Encounter (Signed)
Discussed with pt and with [name]. meds sent to pharm. Advised to go to ED if worse.

## 2016-04-29 NOTE — Telephone Encounter (Signed)
[facility]  To get more info. Xray on 5/30 showed small area that could be early pneumonia. Prescribed cefzil.

## 2016-04-29 NOTE — Telephone Encounter (Signed)
Lev 500 qd for ten d, use albuterol faithfully every four hours while awake,  pred 20 mg three daily for three d, two daily for threed may incr sugars temporarily but that is ok, should help the wheezing, ER if worsens

## 2016-04-29 NOTE — Telephone Encounter (Signed)
Finished antibiotic yesterday. No fever, hard to take a deep breath, nausea when lying down.

## 2016-04-29 NOTE — Telephone Encounter (Signed)
Pt was seen recently for pneumonia and is taking his last antibiotic today. Pt is still wheezing and having a hard time breathing. Please advise.

## 2016-05-03 ENCOUNTER — Encounter (HOSPITAL_COMMUNITY): Payer: Self-pay | Admitting: Emergency Medicine

## 2016-05-03 ENCOUNTER — Other Ambulatory Visit: Payer: Self-pay

## 2016-05-03 ENCOUNTER — Emergency Department (HOSPITAL_COMMUNITY): Payer: Medicare Other

## 2016-05-03 ENCOUNTER — Emergency Department (HOSPITAL_COMMUNITY)
Admission: EM | Admit: 2016-05-03 | Discharge: 2016-05-03 | Disposition: A | Discharge status: Home / Self Care | Payer: Medicare Other | Attending: Emergency Medicine | Admitting: Emergency Medicine

## 2016-05-03 ENCOUNTER — Telehealth: Payer: Self-pay | Admitting: Family Medicine

## 2016-05-03 DIAGNOSIS — Z79899 Other long term (current) drug therapy: Secondary | ICD-10-CM | POA: Diagnosis not present

## 2016-05-03 DIAGNOSIS — I509 Heart failure, unspecified: Secondary | ICD-10-CM | POA: Diagnosis not present

## 2016-05-03 DIAGNOSIS — J45909 Unspecified asthma, uncomplicated: Secondary | ICD-10-CM | POA: Insufficient documentation

## 2016-05-03 DIAGNOSIS — E1151 Type 2 diabetes mellitus with diabetic peripheral angiopathy without gangrene: Secondary | ICD-10-CM | POA: Insufficient documentation

## 2016-05-03 DIAGNOSIS — Z7984 Long term (current) use of oral hypoglycemic drugs: Secondary | ICD-10-CM | POA: Diagnosis not present

## 2016-05-03 DIAGNOSIS — Z87891 Personal history of nicotine dependence: Secondary | ICD-10-CM | POA: Diagnosis not present

## 2016-05-03 DIAGNOSIS — E785 Hyperlipidemia, unspecified: Secondary | ICD-10-CM | POA: Insufficient documentation

## 2016-05-03 DIAGNOSIS — R945 Abnormal results of liver function studies: Secondary | ICD-10-CM

## 2016-05-03 DIAGNOSIS — E1165 Type 2 diabetes mellitus with hyperglycemia: Secondary | ICD-10-CM | POA: Diagnosis not present

## 2016-05-03 DIAGNOSIS — R0602 Shortness of breath: Secondary | ICD-10-CM | POA: Diagnosis not present

## 2016-05-03 DIAGNOSIS — I10 Essential (primary) hypertension: Secondary | ICD-10-CM | POA: Diagnosis not present

## 2016-05-03 DIAGNOSIS — R739 Hyperglycemia, unspecified: Secondary | ICD-10-CM

## 2016-05-03 DIAGNOSIS — R7989 Other specified abnormal findings of blood chemistry: Secondary | ICD-10-CM

## 2016-05-03 DIAGNOSIS — I110 Hypertensive heart disease with heart failure: Secondary | ICD-10-CM | POA: Diagnosis not present

## 2016-05-03 LAB — URINALYSIS, ROUTINE W REFLEX MICROSCOPIC
Bilirubin Urine: NEGATIVE
GLUCOSE, UA: NEGATIVE mg/dL
KETONES UR: NEGATIVE mg/dL
LEUKOCYTES UA: NEGATIVE
Nitrite: NEGATIVE
PH: 5.5 (ref 5.0–8.0)
Protein, ur: NEGATIVE mg/dL
SPECIFIC GRAVITY, URINE: 1.01 (ref 1.005–1.030)

## 2016-05-03 LAB — CBC WITH DIFFERENTIAL/PLATELET
BASOS ABS: 0 10*3/uL (ref 0.0–0.1)
Basophils Relative: 0 %
Eosinophils Absolute: 0 10*3/uL (ref 0.0–0.7)
Eosinophils Relative: 0 %
HEMATOCRIT: 45.4 % (ref 39.0–52.0)
Hemoglobin: 15.3 g/dL (ref 13.0–17.0)
LYMPHS PCT: 5 %
Lymphs Abs: 0.5 10*3/uL — ABNORMAL LOW (ref 0.7–4.0)
MCH: 29.9 pg (ref 26.0–34.0)
MCHC: 33.7 g/dL (ref 30.0–36.0)
MCV: 88.7 fL (ref 78.0–100.0)
Monocytes Absolute: 0.2 10*3/uL (ref 0.1–1.0)
Monocytes Relative: 2 %
NEUTROS ABS: 10.8 10*3/uL — AB (ref 1.7–7.7)
NEUTROS PCT: 93 %
PLATELETS: 253 10*3/uL (ref 150–400)
RBC: 5.12 MIL/uL (ref 4.22–5.81)
RDW: 14.4 % (ref 11.5–15.5)
WBC: 11.6 10*3/uL — AB (ref 4.0–10.5)

## 2016-05-03 LAB — URINE MICROSCOPIC-ADD ON

## 2016-05-03 LAB — COMPREHENSIVE METABOLIC PANEL
ALBUMIN: 4.1 g/dL (ref 3.5–5.0)
ALT: 97 U/L — AB (ref 17–63)
AST: 61 U/L — AB (ref 15–41)
Alkaline Phosphatase: 70 U/L (ref 38–126)
Anion gap: 11 (ref 5–15)
BILIRUBIN TOTAL: 2 mg/dL — AB (ref 0.3–1.2)
BUN: 22 mg/dL — AB (ref 6–20)
CHLORIDE: 98 mmol/L — AB (ref 101–111)
CO2: 25 mmol/L (ref 22–32)
CREATININE: 1.08 mg/dL (ref 0.61–1.24)
Calcium: 9.1 mg/dL (ref 8.9–10.3)
GFR calc Af Amer: >60 mL/min (ref >60)
GLUCOSE: 173 mg/dL — AB (ref 65–99)
Potassium: 3.8 mmol/L (ref 3.5–5.1)
Sodium: 134 mmol/L — ABNORMAL LOW (ref 135–145)
Total Protein: 7.4 g/dL (ref 6.5–8.1)

## 2016-05-03 LAB — TROPONIN I: TROPONIN I: 0.03 ng/mL (ref <0.031)

## 2016-05-03 LAB — D-DIMER, QUANTITATIVE: D-Dimer, Quant: 0.28 ug/mL-FEU (ref 0.00–0.50)

## 2016-05-03 LAB — BRAIN NATRIURETIC PEPTIDE: B Natriuretic Peptide: 232 pg/mL — ABNORMAL HIGH (ref 0.0–100.0)

## 2016-05-03 MED ORDER — FUROSEMIDE 10 MG/ML IJ SOLN
20.0000 mg | Freq: Once | INTRAMUSCULAR | Status: AC
Start: 2016-05-03 — End: 2016-05-03
  Administered 2016-05-03: 20 mg via INTRAVENOUS
  Filled 2016-05-03: qty 2

## 2016-05-03 MED ORDER — ALBUTEROL SULFATE (2.5 MG/3ML) 0.083% IN NEBU
5.0000 mg | INHALATION_SOLUTION | Freq: Once | RESPIRATORY_TRACT | Status: AC
  Administered 2016-05-03: 5 mg via RESPIRATORY_TRACT
  Filled 2016-05-03: qty 6

## 2016-05-03 MED ORDER — FUROSEMIDE 20 MG PO TABS: 20.0000 mg | ORAL_TABLET | Freq: Every day | ORAL | Status: DC

## 2016-05-03 NOTE — ED Notes (Signed)
Pt transported to XR.  

## 2016-05-03 NOTE — ED Notes (Signed)
Pt. returned from XR. 

## 2016-05-03 NOTE — ED Notes (Signed)
Pt made aware to return if symptoms worsen or if any life threatening symptoms occur.   

## 2016-05-03 NOTE — Telephone Encounter (Signed)
Pt is at the ED, the family wanted you to know Is having a chest xray, not feeling well, nausea, SOB He has had a breathing treatment and drew blood   FYI

## 2016-05-03 NOTE — ED Notes (Signed)
Pt reports increasing SOB, orthopnea and nausea when he tries to turn on his side.

## 2016-05-03 NOTE — ED Notes (Signed)
Respiratory notified that pt needs breathing tx,

## 2016-05-03 NOTE — ED Provider Notes (Signed)
CSN: [id number]     Arrival date & time 05/03/16  1243 History  By signing my name below, I, Reola Mosher, attest that this documentation has been prepared under the direction and in the presence of Dorie Rank, MD.  Electronically Signed: Reola Mosher, ED Scribe. 05/03/2016. 1:09 PM.    Chief Complaint  Patient presents with  . Shortness of Breath    The history is provided by the patient and the spouse. No language interpreter was used.   HPI Comments: [name] [name] is a 82 y.o. male who presents to the Emergency Department complaining of gradual onset, gradually worsening, constant, moderate shortness of breath that began 3 weeks ago. Pt reports that he has associated nausea when he is laying down and turns onto his side, and orthopnea. Pt was evaluated by his PCP and performed an outpatient CXR on 04/12/16 which showed that he may have a touch of PNA.  At that time he prescribed the pt with antibiotics which have not alleviated his sx. He was scheduled to f/u with his PCP on 05/12/16 for a second CXR. Pt denies a hx of similar lung problems. He reports that he is a former smoker. Pt denies CP, abdominal pain, or leg swelling.  Past Medical History  Diagnosis Date  . Hypertension     Borderline  . Cerebrovascular disease 07/2010    TIA; carotid ultrasound in 07/2010-significant bilateral plaque without focal internal carotid artery stenosis; MRI -encephalomalacia left temporal and right temporal lobes; small inferior right cerebellar infarct; small vessel disease  . Hyperlipidemia     adverse reactions to statins and niacin  . History of noncompliance with medical treatment   . Aortic stenosis 2007    Very mild  . Hepatic steatosis   . Diabetes mellitus     no insulin; A1c of 6.6 in 2005  . Atrial fibrillation (HCC)     Paroxysmal; Echocardiogram in 2007-normal EF; mild LVH; left atrial enlargement; mild stenosis and minimal AI; negative stress nuclear study in 2008  .  Exertional dyspnea   . Degenerative joint disease     feet and legs  . Dizziness     occurs daily,especially in am  . Gastroesophageal reflux disease   . Renal insufficiency   . Asthma   . Irregular heartbeat   . Peripheral vascular disease ([place])   . Temporal arteritis [facility])    Past Surgical History  Procedure Laterality Date  . Rotator cuff repair      Right  . Lipoma excision  1980  . Urethral stricture dilatation  1980s  . Orif ankle fracture  2000    Right  . Colonoscopy  2002  . Transurethral resection of prostate  09/2011  . Colonoscopy  01/19/2012    Procedure: COLONOSCOPY;  Surgeon: Rogene Houston, MD;  Location: [facility];  Service: Endoscopy;  Laterality: N/A;  1030  . Prostate surgery  12/2011   Family History  Problem Relation Age of Onset  . Stroke Mother   . Diabetes Father   . Colon cancer Neg Hx    Social History  Substance Use Topics  . Smoking status: Former Smoker -- 1.00 packs/day for 20 years    Types: Cigarettes    Quit date: 04/25/1992  . Smokeless tobacco: Current User    Types: Chew  . Alcohol Use: No    Review of Systems  Respiratory: Positive for shortness of breath.   Cardiovascular: Negative for chest pain and leg swelling.  Gastrointestinal: Negative for abdominal pain.  All other systems reviewed and are negative.  Allergies  Ambien; Cholestatin; Lipitor; Ranitidine; Simvastatin; and Xanax xr  Home Medications   Prior to Admission medications   Medication Sig Start Date End Date Taking? Authorizing Provider  albuterol (PROVENTIL HFA;VENTOLIN HFA) 108 (90 Base) MCG/ACT inhaler Inhale 2 puffs into the lungs every 4 (four) hours as needed. 04/29/16  Yes Mikey Kirschner, MD  glipiZIDE (GLUCOTROL) 5 MG tablet TAKE 1/2 TABLET IN THE MORNING AND THEN TAKE 1/2 TABLET AT SUPPER. 04/12/16  Yes Kathyrn Drown, MD  levofloxacin (LEVAQUIN) 500 MG tablet Take 1 tablet (500 mg total) by mouth daily. 04/29/16  Yes Mikey Kirschner, MD   lisinopril (PRINIVIL,ZESTRIL) 2.5 MG tablet TAKE (1) TABLET BY MOUTH ONCE DAILY. 02/29/16  Yes Kathyrn Drown, MD  metFORMIN (GLUCOPHAGE) 500 MG tablet 1/2 tablet twice a day 02/05/16  Yes Nilda Simmer, NP  metoprolol succinate (TOPROL-XL) 25 MG 24 hr tablet TAKE 1/2 TABLET ONCE DAILY FOR HIGH BLOOD PRESSURE. 02/05/16  Yes Nilda Simmer, NP  predniSONE (DELTASONE) 20 MG tablet TAKE 3 DAILY FOR 3 DAYS, THEN 2 DAILY FOR 3 DAYS 04/29/16  Yes Mikey Kirschner, MD  traZODone (DESYREL) 100 MG tablet Take 1 tablet (100 mg total) by mouth at bedtime. 02/05/16  Yes Nilda Simmer, NP  warfarin (COUMADIN) 5 MG tablet Take 1 tablet (5 mg total) by mouth daily. TAKE AS DIRECTED BY PHYSICIAN 02/05/16  Yes Nilda Simmer, NP  furosemide (LASIX) 20 MG tablet Take 1 tablet (20 mg total) by mouth daily. 05/03/16   Dorie Rank, MD  HYDROcodone-acetaminophen (NORCO/VICODIN) 5-325 MG tablet Take 1 tablet by mouth 3 (three) times daily as needed for moderate pain. 03/17/16   Kathyrn Drown, MD  nortriptyline (PAMELOR) 10 MG capsule TAKE 1 TO 2 CAPSULES AT BEDTIME FOR BURNING IN FEET. Patient not taking: Reported on 05/03/2016 03/29/16   Kathyrn Drown, MD   BP 152/99 mmHg  Pulse 99  Resp 29  Ht 6\' 1"  (1.854 m)  Wt 104.327 kg  BMI 30.35 kg/m2  SpO2 95%   Physical Exam  Constitutional: No distress.  HENT:  Head: Normocephalic and atraumatic.  Right Ear: External ear normal.  Left Ear: External ear normal.  Eyes: Conjunctivae are normal. Right eye exhibits no discharge. Left eye exhibits no discharge. No scleral icterus.  Neck: Neck supple. No tracheal deviation present.  Cardiovascular: Normal rate, regular rhythm and intact distal pulses.   Pulmonary/Chest: Effort normal and breath sounds normal. No stridor. No respiratory distress. He has no wheezes. He has no rales.  Abdominal: Soft. Bowel sounds are normal. He exhibits no distension. There is no tenderness. There is no rebound and no guarding.   Musculoskeletal: He exhibits edema (trace bilateral ankles). He exhibits no tenderness.  Neurological: He is alert. He has normal strength. No cranial nerve deficit (no facial droop, extraocular movements intact, no slurred speech) or sensory deficit. He exhibits normal muscle tone. He displays no seizure activity. Coordination normal.  Skin: Skin is warm and dry. No rash noted. He is not diaphoretic.  Psychiatric: He has a normal mood and affect.  Nursing note and vitals reviewed.   ED Course  Procedures (including critical care time)DIAGNOSTIC STUDIES: Oxygen Saturation is 95% on RA, adequate by my interpretation.   COORDINATION OF CARE: 1:05 PM-Discussed next steps with pt including UA, DG Chest, EKG, CMP, and CBC. Pt verbalized understanding and is agreeable with the  plan.   Meds given in the ED Medications  albuterol (PROVENTIL) (2.5 MG/3ML) 0.083% nebulizer solution 5 mg (5 mg Nebulization Given 05/03/16 1335)  furosemide (LASIX) injection 20 mg (20 mg Intravenous Given 05/03/16 1527)     Labs Review Labs Reviewed  CBC WITH DIFFERENTIAL/PLATELET - Abnormal; Notable for the following:    WBC 11.6 (*)    Neutro Abs 10.8 (*)    Lymphs Abs 0.5 (*)    All other components within normal limits  COMPREHENSIVE METABOLIC PANEL - Abnormal; Notable for the following:    Sodium 134 (*)    Chloride 98 (*)    Glucose, Bld 173 (*)    BUN 22 (*)    AST 61 (*)    ALT 97 (*)    Total Bilirubin 2.0 (*)    All other components within normal limits  BRAIN NATRIURETIC PEPTIDE - Abnormal; Notable for the following:    B Natriuretic Peptide 232.0 (*)    All other components within normal limits  URINALYSIS, ROUTINE W REFLEX MICROSCOPIC (NOT AT [facility]) - Abnormal; Notable for the following:    Hgb urine dipstick SMALL (*)    All other components within normal limits  URINE MICROSCOPIC-ADD ON - Abnormal; Notable for the following:    Squamous Epithelial / LPF 0-5 (*)    Bacteria, UA RARE (*)     All other components within normal limits  TROPONIN I  D-DIMER, QUANTITATIVE (NOT AT [facility])    Imaging Review Dg Chest 2 View  05/03/2016  CLINICAL DATA:  Worsening shortness of breath.  Orthopnea.  Nausea. EXAM: CHEST  2 VIEW COMPARISON:  04/12/2016 FINDINGS: Mild enlargement of the cardiopericardial silhouette with indistinct pulmonary vasculature. Faint interstitial accentuation favoring the lung bases. Blunting of both posterior costophrenic angles compatible with small bilateral pleural effusions. Atherosclerotic calcification of the aortic arch. IMPRESSION: 1. Mild enlargement of the cardiopericardial silhouette with a suggestion of pulmonary venous hypertension. Faint interstitial accentuation at the lung bases could be further characterized with high-resolution CT, if clinically warranted, but may reflect resolving interstitial edema. 2. Small bilateral pleural effusions. Electronically Signed   By: Van Clines M.D.   On: 05/03/2016 13:38   I have personally reviewed and evaluated these images and lab results as part of my medical decision-making.   EKG Interpretation   Date/Time:  Tuesday May 03 2016 12:51:17 EDT Ventricular Rate:  106 PR Interval:    QRS Duration: 97 QT Interval:  355 QTC Calculation: 472 R Axis:   8 Text Interpretation:  Atrial fibrillation Premature ventricular complexes  No significant change since last tracing Confirmed by Terie Lear  MD-J, Shanea Karney  [id number]) on 05/03/2016 1:10:37 PM      MDM   Final diagnoses:  Acute on chronic congestive heart failure, unspecified congestive heart failure type ([place])   The patient's laboratory tests show an increase in his BNP. He also has chest x-ray findings to suggest mild pulmonary edema. No evidence of pneumonia. D-dimer is negative doubt PE. I doubt acute cardiac ischemia. Patient normally takes half tablet of 20 mg Lasix daily.  Patient does have tachypnea but is oxygenating normally and does not appear to be in  any distress. I will give him a dose of IV Lasix.  Plan is for the patient to increase his Lasix to 20 mg daily for the next few days. I'll ask him to follow up with his primary care doctor Dr. Brendia Sacks later this week.  I discussed the findings and plan with  the patient and his daughter and they're comfortable with this.  I did discuss the incidental elevated LFTs. Not having any abdominal pain. He will follow-up with his doctor regarding this.  I personally performed the services described in this documentation, which was scribed in my presence.  The recorded information has been reviewed and is accurate.     Dorie Rank, MD 05/03/16 [phone]

## 2016-05-03 NOTE — Telephone Encounter (Signed)
appt made already for next week for ED follow up

## 2016-05-03 NOTE — Telephone Encounter (Signed)
Certainly if the patient is discharged from the ER later today in needs follow-up later this week I will be happy to see him on Friday

## 2016-05-03 NOTE — Discharge Instructions (Signed)

## 2016-05-04 ENCOUNTER — Telehealth: Payer: Self-pay | Admitting: *Deleted

## 2016-05-04 NOTE — Telephone Encounter (Signed)
Fax from [facility] requesting refill on hydrocodone 5/325 #90 one tid prn moderate pain. Last seen for office visit 5/30

## 2016-05-05 ENCOUNTER — Telehealth: Payer: Self-pay | Admitting: Family Medicine

## 2016-05-05 MED ORDER — HYDROCODONE-ACETAMINOPHEN 5-325 MG PO TABS: 1.0000 | ORAL_TABLET | Freq: Three times a day (TID) | ORAL | Status: DC | PRN

## 2016-05-05 NOTE — Telephone Encounter (Signed)
Pt was in the Pharmacy to get refills today on his Inhaler which he just got  Not long ago, they were calling because they were alarmed at his health Condition and wanted you to be aware that he could barely talk without Having to stop every other word to take a breath.  Just fyi

## 2016-05-05 NOTE — Telephone Encounter (Signed)
May have script for 90, 1 tid

## 2016-05-05 NOTE — Telephone Encounter (Signed)
[place] to have delivery personal pick up prescription for patient per patient's request. Patient notified that [facility] will pick up the prescription.

## 2016-05-05 NOTE — Telephone Encounter (Signed)
Patient states that he has an appointment on Monday and he will keep that. If he worsens he will call us back.

## 2016-05-05 NOTE — Telephone Encounter (Signed)
The patient is scheduled to follow-up on Monday. Patient recently treated in the ER released instructed to follow-up if the patient feels that he needs to follow-up this week I will see him tomorrow-preferably in the morning-otherwise monday

## 2016-05-09 ENCOUNTER — Encounter: Payer: Self-pay | Admitting: Family Medicine

## 2016-05-09 ENCOUNTER — Ambulatory Visit (INDEPENDENT_AMBULATORY_CARE_PROVIDER_SITE_OTHER): Payer: Medicare Other | Admitting: Family Medicine

## 2016-05-09 VITALS — BP 110/70 | Ht 73.0 in | Wt 226.0 lb

## 2016-05-09 DIAGNOSIS — R270 Ataxia, unspecified: Secondary | ICD-10-CM

## 2016-05-09 DIAGNOSIS — R748 Abnormal levels of other serum enzymes: Secondary | ICD-10-CM

## 2016-05-09 DIAGNOSIS — R0609 Other forms of dyspnea: Secondary | ICD-10-CM

## 2016-05-09 DIAGNOSIS — I481 Persistent atrial fibrillation: Secondary | ICD-10-CM

## 2016-05-09 DIAGNOSIS — R29898 Other symptoms and signs involving the musculoskeletal system: Secondary | ICD-10-CM | POA: Diagnosis not present

## 2016-05-09 DIAGNOSIS — K5900 Constipation, unspecified: Secondary | ICD-10-CM | POA: Diagnosis not present

## 2016-05-09 DIAGNOSIS — I4819 Other persistent atrial fibrillation: Secondary | ICD-10-CM

## 2016-05-09 NOTE — Progress Notes (Signed)
Subjective:    Patient ID: [name] [name], male    DOB: 03/29/1932, 82 y.o.   MRN: [id number]  HPI Patient is here today for a ER follow up visit. Patient was seen at [facility] ER on 05/03/16 for CHF. Patient states that his symptoms have not improved at all. Patient states that he is concerned about what his diagnose is. Family member wants to know if physical therapy could be of benefit to the patient.  Patient with significant shortness of breath with activity. Patient admits that he is not been physically active in some time. Does minimal activity. He does have intermittent constipation He has poor nutrition at home his wife is been in the hospital then in the rest home for the past several weeks Patient recently in the ER diagnosed with possible early CHF was placed on a diuretic. Patient also had elevated liver enzymes in the hospital but denied any abdominal pain flank pain or jaundice Energy level is been low patient denies being depressed. Patient is at the point where he would benefit from being in a supported care facility but he does not one Aleve his home because his wife is coming home from the nursing home this week. He would benefit from having aide in the house helping him with bathing fixing food and basic household chores. It is unknown if he will qualify for this.   Review of Systems Patient relates some shortness of breath with activity denies chest tightness pressure pain states a lot of weakness in his legs relates ataxia. Denies headaches. States he feels bad. Low energy. Low appetite. Denies sweats chills    Objective:   Physical Exam Patient looks to feel weak but he does not appear toxic he does not appear respiratory distress. He states he just doesn't feel good. Unfortunately he has not felt good for several months. Patient intermittently does not get much physical activity he gets out of breath when he tries to do things he denies chest pressure tightness on exam the  lungs are clear no crackles heart irregular rate is controlled blood pressure good pulse normal abdomen soft extremities no edema skin warm dry       Assessment & Plan:  1. Persistent atrial fibrillation ([place]) Patient is having increased fatigue tiredness. Could well be related to atrial fibrillation. Patient needs referral to cardiology will need to have a echo. - Ambulatory referral to Cardiology  2. DOE (dyspnea on exertion) Patient having difficult time taking care of himself has very significant weakness fatigue tiredness. Needs home health. Face-to-face done. Would probably benefit from physical therapy as well. Patient is not far from being in assisted living he would benefit from having a live-in aide if his insurance carrier could help him with this. - Ambulatory referral to Home Health - Hepatic function panel - Basic metabolic panel  3. Constipation, unspecified constipation type Intermittent constipation MiraLAX recommended. If progressive troubles may need referral for further workup.  4. Elevated liver enzymes Recent liver enzymes elevated. Repeat lab work. - Hepatic function panel - Basic metabolic panel  5. Ataxia Ataxia related weakness. Patient not eating well. Needs to do a better job eating his wife is in the nursing home currently this is affecting him. Patient would benefit from physical therapy - Ambulatory referral to [facility] - Ambulatory referral to Physical Therapy  6. Weakness of both lower extremities Significant weakness physical therapy home health recommended patient is at risk for falling - Ambulatory referral to [facility] -  Ambulatory referral to Physical Therapy  Patient should follow-up with [place] in approximately 6-8 weeks. INR in a couple weeks.

## 2016-05-10 LAB — HEPATIC FUNCTION PANEL
ALT: 38 IU/L (ref 0–44)
AST: 15 IU/L (ref 0–40)
Albumin: 3.9 g/dL (ref 3.5–4.7)
Alkaline Phosphatase: 77 IU/L (ref 39–117)
BILIRUBIN, DIRECT: 0.32 mg/dL (ref 0.00–0.40)
Bilirubin Total: 0.9 mg/dL (ref 0.0–1.2)
Total Protein: 6.5 g/dL (ref 6.0–8.5)

## 2016-05-10 LAB — BASIC METABOLIC PANEL
BUN / CREAT RATIO: 15 (ref 10–24)
BUN: 16 mg/dL (ref 8–27)
CHLORIDE: 95 mmol/L — AB (ref 96–106)
CO2: 27 mmol/L (ref 18–29)
Calcium: 9.2 mg/dL (ref 8.6–10.2)
Creatinine, Ser: 1.08 mg/dL (ref 0.76–1.27)
GFR calc Af Amer: 74 mL/min/{1.73_m2} (ref >59)
GFR calc non Af Amer: 64 mL/min/{1.73_m2} (ref >59)
GLUCOSE: 97 mg/dL (ref 65–99)
POTASSIUM: 4.3 mmol/L (ref 3.5–5.2)
SODIUM: 139 mmol/L (ref 134–144)

## 2016-05-12 ENCOUNTER — Ambulatory Visit: Payer: Medicare Other

## 2016-05-15 DIAGNOSIS — R270 Ataxia, unspecified: Secondary | ICD-10-CM | POA: Diagnosis not present

## 2016-05-15 DIAGNOSIS — I10 Essential (primary) hypertension: Secondary | ICD-10-CM | POA: Diagnosis not present

## 2016-05-15 DIAGNOSIS — E119 Type 2 diabetes mellitus without complications: Secondary | ICD-10-CM | POA: Diagnosis not present

## 2016-05-15 DIAGNOSIS — E785 Hyperlipidemia, unspecified: Secondary | ICD-10-CM | POA: Diagnosis not present

## 2016-05-15 DIAGNOSIS — I481 Persistent atrial fibrillation: Secondary | ICD-10-CM | POA: Diagnosis not present

## 2016-05-17 DIAGNOSIS — I481 Persistent atrial fibrillation: Secondary | ICD-10-CM | POA: Diagnosis not present

## 2016-05-17 DIAGNOSIS — I10 Essential (primary) hypertension: Secondary | ICD-10-CM | POA: Diagnosis not present

## 2016-05-17 DIAGNOSIS — E785 Hyperlipidemia, unspecified: Secondary | ICD-10-CM | POA: Diagnosis not present

## 2016-05-17 DIAGNOSIS — E119 Type 2 diabetes mellitus without complications: Secondary | ICD-10-CM | POA: Diagnosis not present

## 2016-05-17 DIAGNOSIS — R270 Ataxia, unspecified: Secondary | ICD-10-CM | POA: Diagnosis not present

## 2016-05-19 ENCOUNTER — Ambulatory Visit: Payer: Medicare Other | Admitting: Family Medicine

## 2016-05-19 DIAGNOSIS — I481 Persistent atrial fibrillation: Secondary | ICD-10-CM | POA: Diagnosis not present

## 2016-05-19 DIAGNOSIS — I10 Essential (primary) hypertension: Secondary | ICD-10-CM | POA: Diagnosis not present

## 2016-05-19 DIAGNOSIS — E119 Type 2 diabetes mellitus without complications: Secondary | ICD-10-CM | POA: Diagnosis not present

## 2016-05-19 DIAGNOSIS — R270 Ataxia, unspecified: Secondary | ICD-10-CM | POA: Diagnosis not present

## 2016-05-19 DIAGNOSIS — E785 Hyperlipidemia, unspecified: Secondary | ICD-10-CM | POA: Diagnosis not present

## 2016-05-23 ENCOUNTER — Ambulatory Visit (INDEPENDENT_AMBULATORY_CARE_PROVIDER_SITE_OTHER): Payer: Medicare Other | Admitting: Family Medicine

## 2016-05-23 ENCOUNTER — Encounter: Payer: Self-pay | Admitting: Family Medicine

## 2016-05-23 ENCOUNTER — Telehealth: Payer: Self-pay | Admitting: Family Medicine

## 2016-05-23 VITALS — BP 102/68 | HR 40 | Ht 73.0 in

## 2016-05-23 DIAGNOSIS — I350 Nonrheumatic aortic (valve) stenosis: Secondary | ICD-10-CM | POA: Diagnosis not present

## 2016-05-23 DIAGNOSIS — E119 Type 2 diabetes mellitus without complications: Secondary | ICD-10-CM | POA: Diagnosis not present

## 2016-05-23 DIAGNOSIS — Z7901 Long term (current) use of anticoagulants: Secondary | ICD-10-CM | POA: Diagnosis not present

## 2016-05-23 DIAGNOSIS — I481 Persistent atrial fibrillation: Secondary | ICD-10-CM | POA: Diagnosis not present

## 2016-05-23 DIAGNOSIS — E785 Hyperlipidemia, unspecified: Secondary | ICD-10-CM | POA: Diagnosis not present

## 2016-05-23 DIAGNOSIS — R270 Ataxia, unspecified: Secondary | ICD-10-CM | POA: Diagnosis not present

## 2016-05-23 DIAGNOSIS — I480 Paroxysmal atrial fibrillation: Secondary | ICD-10-CM | POA: Diagnosis not present

## 2016-05-23 DIAGNOSIS — I10 Essential (primary) hypertension: Secondary | ICD-10-CM | POA: Diagnosis not present

## 2016-05-23 LAB — POCT INR: INR: 3.2

## 2016-05-23 LAB — POCT GLYCOSYLATED HEMOGLOBIN (HGB A1C): HEMOGLOBIN A1C: 6.1

## 2016-05-23 NOTE — Progress Notes (Signed)
   Subjective:    Patient ID: [name] [name], male    DOB: 1932-10-09, 82 y.o.   MRN: [id number]  HPI  Patietn arrives with c/o sob, weakness and dizziness. Patient finds himself getting weaker. States he gets short of breath when he tries to do stuff. States had some spells of dizziness. Denies passing out. Denies high fever chills chest tightness pressure pain. Has been in the ER couple different times and has been here to the office. Patient is been getting progressively weaker over the past few months. Patient has had numerous testing including slight elevation of BNP. Normal d-dimer. Other lab work stable. Review of Systems Patient is found increasing fatigue tiredness low energy. Denies high fever chills denies cough. Was on antibiotic recently but just stop this at 2 days back    Objective:   Physical Exam  Lungs are clear no crackles heart rate irregular rate is controlled at 80-90 pulse normal BP good extremities no edema skin warm dry patient looks to feel ill but does not appear toxic 25 minutes was spent with the patient. Greater than half the time was spent in discussion and answering questions and counseling regarding the issues that the patient came in for today.      Assessment & Plan:  Atrial fibrillation rate is controlled currently INR is 3.2 but he was just recently on antibiotics we will recheck INR again in 2 weeks' time continue as is  Concerning regarding possible cardiac issues will be seen cardiologist tomorrow hopefully will get echo. Has history of aortic stenosis. Not sure if this is causing an issue  Progressive fatigue and dyspnea probably related to deconditioning as well  It appears pneumonia has resolved  Diabetes A1c is lower than what I would like to see it stop glipizide I'm concerned about possibility of low sugars

## 2016-05-23 NOTE — Telephone Encounter (Signed)
Having dizziness and sob for over one week. Worse today. O2 on room air was 96 %, bp 124/68, pulse 84. Consult with dr Nicki Reaper. Ov today. If in respiratory distress go directly to ED. Latina home health nurse at [facility] verbalized understanding and pt is coming in today.

## 2016-05-23 NOTE — Patient Instructions (Signed)
Glipizide please stop this medication   Check your sugars in the am before eating at least twice a week  Send me readings  Continue metformin

## 2016-05-23 NOTE — Telephone Encounter (Signed)
Just wanted to let Dr. Nicki Reaper know that ever since she has been there today, patient has been complaining of dizzyness and feeling SOB.  His O2 on room air is 96%.  Please advise.

## 2016-05-24 ENCOUNTER — Telehealth: Payer: Self-pay | Admitting: Family Medicine

## 2016-05-24 ENCOUNTER — Encounter: Payer: Self-pay | Admitting: Cardiology

## 2016-05-24 ENCOUNTER — Ambulatory Visit: Payer: Medicare Other

## 2016-05-24 ENCOUNTER — Ambulatory Visit (INDEPENDENT_AMBULATORY_CARE_PROVIDER_SITE_OTHER): Payer: Medicare Other | Admitting: Cardiology

## 2016-05-24 VITALS — BP 140/70 | HR 115 | Ht 73.0 in | Wt 231.0 lb

## 2016-05-24 DIAGNOSIS — I350 Nonrheumatic aortic (valve) stenosis: Secondary | ICD-10-CM | POA: Diagnosis not present

## 2016-05-24 DIAGNOSIS — I4891 Unspecified atrial fibrillation: Secondary | ICD-10-CM

## 2016-05-24 DIAGNOSIS — R0602 Shortness of breath: Secondary | ICD-10-CM

## 2016-05-24 DIAGNOSIS — R42 Dizziness and giddiness: Secondary | ICD-10-CM

## 2016-05-24 DIAGNOSIS — I6529 Occlusion and stenosis of unspecified carotid artery: Secondary | ICD-10-CM | POA: Diagnosis not present

## 2016-05-24 MED ORDER — METOPROLOL SUCCINATE ER 25 MG PO TB24: 25.0000 mg | ORAL_TABLET | Freq: Every day | ORAL | Status: DC

## 2016-05-24 NOTE — Progress Notes (Deleted)
Orthostatic BP ended after first standing BP. Patient became very dizzy and needed to sit down.

## 2016-05-24 NOTE — Telephone Encounter (Signed)
I have not received information from cardiologist yet. The only fluid pill we had him on was Lasix. If they increase the dose please follow the dose that cardiology recommended. I am edging cardiology also recommended follow-up lab work or possible follow-up office visit? Cardiology will post their office visit to [place] somewhere in the next 2-3 days I will be watching for

## 2016-05-24 NOTE — Progress Notes (Signed)
Clinical Summary Mr. Mase is a 82 y.o.male seen today as a new patient, he is referred by Dr Sallee Lange for the following medical problems. He was last seen by NP Purcell Nails in 2013  1. Afib - denies any palpitations - on coumadin, INR followed by Dr Wolfgang Phoenix  2. Aortic stenosis - mild to moderate  AS by echo in 2013  mean grad 18 AVA 1.16 - chronic SOB. No chest pain, no syncope.   3. HTN - compliant with meds  4. Carotid stenosis - mild bilateral carotid stenosis in 2013 - no recent focal neurologicla symptoms  5. Dizziness - positional, occurs with postion changes, changes in head posittion while laying down. Ongoing for 2 years  6. Chronic SOB - previous echo as reported above with normal LVEF, mild to mod AS - former tobacco x 34 years. He does not recall ever having PFTs  Past Medical History  Diagnosis Date  . Hypertension     Borderline  . Cerebrovascular disease 07/2010    TIA; carotid ultrasound in 07/2010-significant bilateral plaque without focal internal carotid artery stenosis; MRI -encephalomalacia left temporal and right temporal lobes; small inferior right cerebellar infarct; small vessel disease  . Hyperlipidemia     adverse reactions to statins and niacin  . History of noncompliance with medical treatment   . Aortic stenosis 2007    Very mild  . Hepatic steatosis   . Diabetes mellitus     no insulin; A1c of 6.6 in 2005  . Atrial fibrillation (HCC)     Paroxysmal; Echocardiogram in 2007-normal EF; mild LVH; left atrial enlargement; mild stenosis and minimal AI; negative stress nuclear study in 2008  . Exertional dyspnea   . Degenerative joint disease     feet and legs  . Dizziness     occurs daily,especially in am  . Gastroesophageal reflux disease   . Renal insufficiency   . Asthma   . Irregular heartbeat   . Peripheral vascular disease ([place])   . Temporal arteritis (HCC)      Allergies  Allergen Reactions  . Ambien [Zolpidem Tartrate]  Other (See Comments)    Sleep walks  . Cholestatin   . Lipitor [Atorvastatin Calcium] Other (See Comments)    myalgias  . Ranitidine Other (See Comments)    Chest discomfort  . Simvastatin Other (See Comments)    Myalgias  . Xanax Xr [Alprazolam Er]     Tightness in chest     Current Outpatient Prescriptions  Medication Sig Dispense Refill  . albuterol (PROVENTIL HFA;VENTOLIN HFA) 108 (90 Base) MCG/ACT inhaler Inhale 2 puffs into the lungs every 4 (four) hours as needed. 18 g 6  . furosemide (LASIX) 20 MG tablet Take 1 tablet (20 mg total) by mouth daily. 15 tablet 5  . HYDROcodone-acetaminophen (NORCO/VICODIN) 5-325 MG tablet Take 1 tablet by mouth 3 (three) times daily as needed for moderate pain. 90 tablet 0  . lisinopril (PRINIVIL,ZESTRIL) 2.5 MG tablet TAKE (1) TABLET BY MOUTH ONCE DAILY. 30 tablet 5  . metFORMIN (GLUCOPHAGE) 500 MG tablet 1/2 tablet twice a day 30 tablet 5  . metoprolol succinate (TOPROL-XL) 25 MG 24 hr tablet TAKE 1/2 TABLET ONCE DAILY FOR HIGH BLOOD PRESSURE. 15 tablet 5  . nortriptyline (PAMELOR) 10 MG capsule TAKE 1 TO 2 CAPSULES AT BEDTIME FOR BURNING IN FEET. (Patient not taking: Reported on 05/03/2016) 60 capsule 5  . traZODone (DESYREL) 100 MG tablet Take 1 tablet (100 mg total) by mouth  at bedtime. 30 tablet 5  . warfarin (COUMADIN) 5 MG tablet Take 1 tablet (5 mg total) by mouth daily. TAKE AS DIRECTED BY PHYSICIAN 30 tablet 5   No current facility-administered medications for this visit.     Past Surgical History  Procedure Laterality Date  . Rotator cuff repair      Right  . Lipoma excision  1980  . Urethral stricture dilatation  1980s  . Orif ankle fracture  2000    Right  . Colonoscopy  2002  . Transurethral resection of prostate  09/2011  . Colonoscopy  01/19/2012    Procedure: COLONOSCOPY;  Surgeon: Rogene Houston, MD;  [facility];  Service: Endoscopy;  Laterality: N/A;  1030  . Prostate surgery  12/2011     Allergies    Allergen Reactions  . Ambien [Zolpidem Tartrate] Other (See Comments)    Sleep walks  . Cholestatin   . Lipitor [Atorvastatin Calcium] Other (See Comments)    myalgias  . Ranitidine Other (See Comments)    Chest discomfort  . Simvastatin Other (See Comments)    Myalgias  . Xanax Xr [Alprazolam Er]     Tightness in chest      Family History  Problem Relation Age of Onset  . Stroke Mother   . Diabetes Father   . Colon cancer Neg Hx      Social History Mr. Hecksel reports that he quit smoking about 24 years ago. His smoking use included Cigarettes. He has a 20 pack-year smoking history. His smokeless tobacco use includes Chew. Mr. Calkin reports that he does not drink alcohol.   Review of Systems CONSTITUTIONAL: No weight loss, fever, chills, weakness or fatigue.  HEENT: Eyes: No visual loss, blurred vision, double vision or yellow sclerae.No hearing loss, sneezing, congestion, runny nose or sore throat.  SKIN: No rash or itching.  CARDIOVASCULAR: per HPI RESPIRATORY: per HPI GASTROINTESTINAL: No anorexia, nausea, vomiting or diarrhea. No abdominal pain or blood.  GENITOURINARY: No burning on urination, no polyuria NEUROLOGICAL: No headache, dizziness, syncope, paralysis, ataxia, numbness or tingling in the extremities. No change in bowel or bladder control.  MUSCULOSKELETAL: No muscle, back pain, joint pain or stiffness.  LYMPHATICS: No enlarged nodes. No history of splenectomy.  PSYCHIATRIC: No history of depression or anxiety.  ENDOCRINOLOGIC: No reports of sweating, cold or heat intolerance. No polyuria or polydipsia.  [name]   Physical Examination Filed Vitals:   05/24/16 0941 05/24/16 0946  BP: 132/64 140/70  Pulse: 103 115   Filed Vitals:   05/24/16 0926  Height: 6\' 1"  (1.854 m)  Weight: 231 lb (104.781 kg)    Gen: resting comfortably, no acute distress HEENT: no scleral icterus, pupils equal round and reactive, no palptable cervical adenopathy,  CV: irreg,  tachy [id number], 3/6 systolic murmur rusb, no jvd, +carotid bruits bilateral Resp: Clear to auscultation bilaterally GI: abdomen is soft, non-tender, non-distended, normal bowel sounds, no hepatosplenomegaly MSK: extremities are warm, no edema.  Skin: warm, no rash Neuro:  no focal deficits Psych: appropriate affect   Diagnostic Studies 04/2012 Nuclear stress test IMPRESSION: Negative pharmacologic stress nuclear myocardial study revealing no stress induced EKG abnormalities, normal left ventricular size and normal left ventricular systolic function. By scintigraphic imaging, there was a minimal area at the base of the inferior wall with apparently abnormal perfusion, likely the result of attenuation artifact.   03/2012 echo Study Conclusions  - Left ventricle: The cavity size was normal. There was mild focal basal hypertrophy  of the septum. Systolic function was low normal. The estimated ejection fraction was in the range of 50% to 55%. Although no diagnostic regional wall motion abnormality was identified, this possibility cannot be completely excluded on the basis of this study - cannot exclude inferior hypokinesis. The study is not technically sufficient to allow evaluation of LV diastolic function. - Aortic valve: Mildly calcified annulus. Trileaflet; moderately calcified leaflets. Cusp separation was reduced. There was mild stenosis. Trivial regurgitation. Mean gradient: 80mm Hg (S). Valve area: 1.16cm^2 (Vmax). - Mitral valve: Calcified annulus. Mildly thickened leaflets . Mild regurgitation. - Left atrium: The atrium was moderately dilated. - Atrial septum: There was increased thickness of the septum, consistent with lipomatous hypertrophy. - Tricuspid valve: Trivial regurgitation. - Pericardium, extracardiac: There was no pericardial effusion.   Assessment and Plan   1. Afib - he denies any palpitations. EKG in clinic today shows afib with  elevated rates, this may be playing some role in his SOB - we will increase Toprol XL to 25mg  daily - CHADS2Vasc score of 4, continue coumaind.   2. Aortic stenosis - we will repeat echo  3. HTN - above goal given his history of DM2, increase Toprol XL as described above  4. Carotid stenosis - repeat carotid US  5. Vertigo - weill give prn meclcizine  6. SOB - f/u repeat echo - increased rate control of afib as described above - consider PFTs in future given long tobacco history   F/u 3-4 weeks.    Arnoldo Lenis, M.D., F.A.C.C.

## 2016-05-24 NOTE — Patient Instructions (Signed)
Your physician recommends that you schedule a follow-up appointment in: [place] DR. [place]  Your physician has recommended you make the following change in your medication:   INCREASE TOPROL XL 25 MG DAILY  Your physician has requested that you have an echocardiogram. Echocardiography is a painless test that uses sound waves to create images of your heart. It provides your doctor with information about the size and shape of your heart and how well your heart's chambers and valves are working. This procedure takes approximately one hour. There are no restrictions for this procedure.   Your physician has requested that you have a carotid duplex. This test is an ultrasound of the carotid arteries in your neck. It looks at blood flow through these arteries that supply the brain with blood. Allow one hour for this exam. There are no restrictions or special instructions.  Thank you for choosing [facility]!!.

## 2016-05-24 NOTE — Telephone Encounter (Signed)
[facility] 05/24/16

## 2016-05-24 NOTE — Telephone Encounter (Signed)
Pt's heart doc has increased his furosemide and the family was wanting to make  Sure that there was no other meds he was taking that was for the same thing that They would need to stop.   Please call [name] back at [phone]

## 2016-05-25 DIAGNOSIS — R270 Ataxia, unspecified: Secondary | ICD-10-CM | POA: Diagnosis not present

## 2016-05-25 DIAGNOSIS — I10 Essential (primary) hypertension: Secondary | ICD-10-CM | POA: Diagnosis not present

## 2016-05-25 DIAGNOSIS — E119 Type 2 diabetes mellitus without complications: Secondary | ICD-10-CM | POA: Diagnosis not present

## 2016-05-25 DIAGNOSIS — E785 Hyperlipidemia, unspecified: Secondary | ICD-10-CM | POA: Diagnosis not present

## 2016-05-25 DIAGNOSIS — I481 Persistent atrial fibrillation: Secondary | ICD-10-CM | POA: Diagnosis not present

## 2016-05-25 NOTE — Telephone Encounter (Signed)
Discussed with Barbara.

## 2016-05-26 ENCOUNTER — Telehealth: Payer: Self-pay | Admitting: Family Medicine

## 2016-05-26 NOTE — Telephone Encounter (Signed)
Telephone note from 7-11 [name] was told to follow directions from cardiologist. Is this what you still recommend.

## 2016-05-26 NOTE — Telephone Encounter (Signed)
I would recommend furosemide once daily if fluid retention occurs. I also recommend that all of his medications be brought with him to Dr. branches appointment so there staff and Dr. can go over each one and determine what regimen they feel he needs to be on. Keep follow-up visit in August(if ongoing confusion regarding this patient touch base with Dr. Harl Bowie)

## 2016-05-26 NOTE — Telephone Encounter (Signed)
Pt's niece is wanting to know if the pt should continue his lasix. Please advise

## 2016-05-26 NOTE — Telephone Encounter (Signed)
Nurses talk with BA family member [name]. She knows what is going on. Find out what the cardiologist has to say regarding the Lasix. Then discuss with me

## 2016-05-26 NOTE — Telephone Encounter (Signed)
Talked with [place]. 2 days ago when she called the message was dr branch increased furosemide. Today she states it was metoprolol not furosemide. She doesn't know if dr branch wants him on furosemide or not. She states he has not taken any since being home. No swelling. Has appt next week with dr branch and follow up with you on 8/7

## 2016-05-27 ENCOUNTER — Telehealth: Payer: Self-pay | Admitting: Cardiology

## 2016-05-27 ENCOUNTER — Telehealth: Payer: Self-pay | Admitting: *Deleted

## 2016-05-27 ENCOUNTER — Other Ambulatory Visit: Payer: Self-pay | Admitting: *Deleted

## 2016-05-27 DIAGNOSIS — R270 Ataxia, unspecified: Secondary | ICD-10-CM | POA: Diagnosis not present

## 2016-05-27 DIAGNOSIS — E785 Hyperlipidemia, unspecified: Secondary | ICD-10-CM | POA: Diagnosis not present

## 2016-05-27 DIAGNOSIS — E119 Type 2 diabetes mellitus without complications: Secondary | ICD-10-CM | POA: Diagnosis not present

## 2016-05-27 DIAGNOSIS — I10 Essential (primary) hypertension: Secondary | ICD-10-CM | POA: Diagnosis not present

## 2016-05-27 DIAGNOSIS — I481 Persistent atrial fibrillation: Secondary | ICD-10-CM | POA: Diagnosis not present

## 2016-05-27 MED ORDER — FUROSEMIDE 20 MG PO TABS: 20.0000 mg | ORAL_TABLET | Freq: Every day | ORAL | Status: DC

## 2016-05-27 NOTE — Telephone Encounter (Signed)
Expand All Collapse All   Discussed with pt's niece [name]. Dr. Nicki Reaper recommends furosemide once daily if fluid retention occurs. He also recommends that all of his medications be brought with him to Dr. branches appointment so there staff and Dr. Harl Bowie can go over each one and determine what regimen they feel he needs to be on. Keep follow-up visit in August(if ongoing confusion regarding this patient touch base with Dr. Harl Bowie) . [name] verbalized understanding.

## 2016-05-27 NOTE — Telephone Encounter (Signed)
Sarah w/ [facility] called pt is having SOB/crackles --pls call Judson Roch [phone] ASAP

## 2016-05-27 NOTE — Telephone Encounter (Signed)
[name] [name] from [place] care at pt's home. States he is having sob cannot lay down, hears some crackles. , 02 is 98%. Pt is retaining some fluid. Consult with dr Nicki Reaper. ER if sob is worse. Furosemide 52m take 2 today, then start taking one qam. rx sent [facility]. Do met 7 on Monday. Keep follow up with dr branch next week. ER if worse. Per dr sNicki Reaper SClarise Cruzfrom home health verbalized understanding. Got verbal order for home health nurse to come out tomorrow to check on patient.

## 2016-05-27 NOTE — Telephone Encounter (Signed)
[id number] 

## 2016-05-27 NOTE — Telephone Encounter (Signed)
Saw Dr Harl Bowie in [place] on 05/24/16, pt has gained 2 lbs overnite, and has bi-basilar crackles, HHN states his abdomen is distended .She noted he was only given #15 lasix 20 mg tablets.She called Dr Wolfgang Phoenix and he called in #30 with instructions to take 40 mg tonight, then go back to 20 mg daily and have nurse visit tomorrow.BMET to be done on Monday 05/30/16   Will forward to Dr Francine Graven

## 2016-05-27 NOTE — Telephone Encounter (Signed)
Left message to return call 

## 2016-05-28 ENCOUNTER — Emergency Department (HOSPITAL_COMMUNITY)
Admission: EM | Admit: 2016-05-28 | Discharge: 2016-05-28 | Disposition: A | Discharge status: Home / Self Care | Payer: Medicare Other | Attending: Emergency Medicine | Admitting: Emergency Medicine

## 2016-05-28 ENCOUNTER — Emergency Department (HOSPITAL_COMMUNITY): Payer: Medicare Other

## 2016-05-28 ENCOUNTER — Encounter (HOSPITAL_COMMUNITY): Payer: Self-pay | Admitting: Emergency Medicine

## 2016-05-28 DIAGNOSIS — R270 Ataxia, unspecified: Secondary | ICD-10-CM | POA: Diagnosis not present

## 2016-05-28 DIAGNOSIS — G8929 Other chronic pain: Secondary | ICD-10-CM | POA: Diagnosis not present

## 2016-05-28 DIAGNOSIS — Z87891 Personal history of nicotine dependence: Secondary | ICD-10-CM | POA: Insufficient documentation

## 2016-05-28 DIAGNOSIS — J45909 Unspecified asthma, uncomplicated: Secondary | ICD-10-CM | POA: Insufficient documentation

## 2016-05-28 DIAGNOSIS — I4891 Unspecified atrial fibrillation: Secondary | ICD-10-CM | POA: Diagnosis not present

## 2016-05-28 DIAGNOSIS — J811 Chronic pulmonary edema: Secondary | ICD-10-CM | POA: Diagnosis not present

## 2016-05-28 DIAGNOSIS — Z7984 Long term (current) use of oral hypoglycemic drugs: Secondary | ICD-10-CM | POA: Diagnosis not present

## 2016-05-28 DIAGNOSIS — Z79899 Other long term (current) drug therapy: Secondary | ICD-10-CM | POA: Insufficient documentation

## 2016-05-28 DIAGNOSIS — J810 Acute pulmonary edema: Secondary | ICD-10-CM | POA: Diagnosis not present

## 2016-05-28 DIAGNOSIS — E119 Type 2 diabetes mellitus without complications: Secondary | ICD-10-CM | POA: Diagnosis not present

## 2016-05-28 DIAGNOSIS — R0682 Tachypnea, not elsewhere classified: Secondary | ICD-10-CM | POA: Diagnosis not present

## 2016-05-28 DIAGNOSIS — I10 Essential (primary) hypertension: Secondary | ICD-10-CM | POA: Insufficient documentation

## 2016-05-28 DIAGNOSIS — I739 Peripheral vascular disease, unspecified: Secondary | ICD-10-CM | POA: Insufficient documentation

## 2016-05-28 DIAGNOSIS — R064 Hyperventilation: Secondary | ICD-10-CM | POA: Diagnosis not present

## 2016-05-28 DIAGNOSIS — E785 Hyperlipidemia, unspecified: Secondary | ICD-10-CM | POA: Diagnosis not present

## 2016-05-28 DIAGNOSIS — I481 Persistent atrial fibrillation: Secondary | ICD-10-CM | POA: Diagnosis not present

## 2016-05-28 DIAGNOSIS — R0602 Shortness of breath: Secondary | ICD-10-CM | POA: Diagnosis not present

## 2016-05-28 LAB — CBC WITH DIFFERENTIAL/PLATELET
Basophils Absolute: 0 10*3/uL (ref 0.0–0.1)
Basophils Relative: 1 %
EOS PCT: 4 %
Eosinophils Absolute: 0.3 10*3/uL (ref 0.0–0.7)
HEMATOCRIT: 40.7 % (ref 39.0–52.0)
Hemoglobin: 13.9 g/dL (ref 13.0–17.0)
LYMPHS PCT: 20 %
Lymphs Abs: 1.4 10*3/uL (ref 0.7–4.0)
MCH: 29.8 pg (ref 26.0–34.0)
MCHC: 34.2 g/dL (ref 30.0–36.0)
MCV: 87.3 fL (ref 78.0–100.0)
MONO ABS: 0.5 10*3/uL (ref 0.1–1.0)
MONOS PCT: 8 %
NEUTROS ABS: 4.8 10*3/uL (ref 1.7–7.7)
Neutrophils Relative %: 67 %
Platelets: 255 10*3/uL (ref 150–400)
RBC: 4.66 MIL/uL (ref 4.22–5.81)
RDW: 14.6 % (ref 11.5–15.5)
WBC: 7 10*3/uL (ref 4.0–10.5)

## 2016-05-28 LAB — BASIC METABOLIC PANEL
ANION GAP: 7 (ref 5–15)
BUN: 19 mg/dL (ref 6–20)
CALCIUM: 9 mg/dL (ref 8.9–10.3)
CHLORIDE: 99 mmol/L — AB (ref 101–111)
CO2: 28 mmol/L (ref 22–32)
Creatinine, Ser: 1.14 mg/dL (ref 0.61–1.24)
GFR calc non Af Amer: 58 mL/min — ABNORMAL LOW (ref >60)
Glucose, Bld: 144 mg/dL — ABNORMAL HIGH (ref 65–99)
Potassium: 3.7 mmol/L (ref 3.5–5.1)
Sodium: 134 mmol/L — ABNORMAL LOW (ref 135–145)

## 2016-05-28 LAB — BRAIN NATRIURETIC PEPTIDE: B NATRIURETIC PEPTIDE 5: 294 pg/mL — AB (ref 0.0–100.0)

## 2016-05-28 LAB — TROPONIN I: Troponin I: <0.03 ng/mL (ref <0.03)

## 2016-05-28 MED ORDER — ALBUTEROL SULFATE HFA 108 (90 BASE) MCG/ACT IN AERS: 1.0000 | INHALATION_SPRAY | Freq: Every day | RESPIRATORY_TRACT | Status: DC | PRN

## 2016-05-28 MED ORDER — ALBUTEROL SULFATE (2.5 MG/3ML) 0.083% IN NEBU
5.0000 mg | INHALATION_SOLUTION | Freq: Once | RESPIRATORY_TRACT | Status: AC
  Administered 2016-05-28: 5 mg via RESPIRATORY_TRACT
  Filled 2016-05-28: qty 6

## 2016-05-28 NOTE — ED Notes (Signed)
Having SOB for last 6 months but increasing SOB in last 2 weeks.  Sats 92 % on RA and 99% on 4l/m via n/c.  #20 left forearm via EMS.

## 2016-05-28 NOTE — ED Provider Notes (Signed)
CSN: [id number]     Arrival date & time 05/28/16  1516 History   First MD Initiated Contact with Patient 05/28/16 1527     Chief Complaint  Patient presents with  . Shortness of Breath     (Consider location/radiation/quality/duration/timing/severity/associated sxs/prior Treatment) HPI....[name] [name] for several months, worse for the past 2 weeks. Patient was recently placed on Lasix by his primary care doctor. He has known heart disease. No crushing substernal chest pain, diaphoresis, nausea. He is able to walk but only minimally. Severity is mild to moderate. He has used an inhaler at home with moderate success  Past Medical History  Diagnosis Date  . Hypertension     Borderline  . Cerebrovascular disease 07/2010    TIA; carotid ultrasound in 07/2010-significant bilateral plaque without focal internal carotid artery stenosis; MRI -encephalomalacia left temporal and right temporal lobes; small inferior right cerebellar infarct; small vessel disease  . Hyperlipidemia     adverse reactions to statins and niacin  . History of noncompliance with medical treatment   . Aortic stenosis 2007    Very mild  . Hepatic steatosis   . Diabetes mellitus     no insulin; A1c of 6.6 in 2005  . Atrial fibrillation (HCC)     Paroxysmal; Echocardiogram in 2007-normal EF; mild LVH; left atrial enlargement; mild stenosis and minimal AI; negative stress nuclear study in 2008  . Exertional dyspnea   . Degenerative joint disease     feet and legs  . Dizziness     occurs daily,especially in am  . Gastroesophageal reflux disease   . Renal insufficiency   . Asthma   . Irregular heartbeat   . Peripheral vascular disease ([place])   . Temporal arteritis [facility])    Past Surgical History  Procedure Laterality Date  . Rotator cuff repair      Right  . Lipoma excision  1980  . Urethral stricture dilatation  1980s  . Orif ankle fracture  2000    Right  . Colonoscopy  2002  . Transurethral resection of prostate   09/2011  . Colonoscopy  01/19/2012    Procedure: COLONOSCOPY;  Surgeon: Rogene Houston, MD;  Location: [facility];  Service: Endoscopy;  Laterality: N/A;  1030  . Prostate surgery  12/2011   Family History  Problem Relation Age of Onset  . Stroke Mother   . Diabetes Father   . Colon cancer Neg Hx    Social History  Substance Use Topics  . Smoking status: Former Smoker -- 1.00 packs/day for 20 years    Types: Cigarettes    Start date: 07/04/1950    Quit date: 04/25/1992  . Smokeless tobacco: Current User    Types: Chew  . Alcohol Use: No    Review of Systems  All other systems reviewed and are negative.     Allergies  Ambien; Cholestatin; Lipitor; Ranitidine; Simvastatin; and Xanax xr  Home Medications   Prior to Admission medications   Medication Sig Start Date End Date Taking? Authorizing Provider  furosemide (LASIX) 20 MG tablet Take 1 tablet (20 mg total) by mouth daily. 05/27/16  Yes Kathyrn Drown, MD  glipiZIDE (GLUCOTROL) 5 MG tablet Take 5 mg by mouth daily before breakfast.  05/04/16  Yes Historical Provider, MD  HYDROcodone-acetaminophen (NORCO/VICODIN) 5-325 MG tablet Take 1 tablet by mouth 3 (three) times daily as needed for moderate pain. 05/05/16  Yes Kathyrn Drown, MD  lisinopril (PRINIVIL,ZESTRIL) 2.5 MG tablet TAKE (1) TABLET BY MOUTH  ONCE DAILY. 02/29/16  Yes Kathyrn Drown, MD  metFORMIN (GLUCOPHAGE) 500 MG tablet 1/2 tablet twice a day 02/05/16  Yes Nilda Simmer, NP  metoprolol succinate (TOPROL-XL) 25 MG 24 hr tablet Take 1 tablet (25 mg total) by mouth daily. 05/24/16  Yes Arnoldo Lenis, MD  nortriptyline (PAMELOR) 10 MG capsule TAKE 1 TO 2 CAPSULES AT BEDTIME FOR BURNING IN FEET. 03/29/16  Yes Kathyrn Drown, MD  traZODone (DESYREL) 100 MG tablet Take 1 tablet (100 mg total) by mouth at bedtime. 02/05/16  Yes Nilda Simmer, NP  warfarin (COUMADIN) 5 MG tablet Take 1 tablet (5 mg total) by mouth daily. TAKE AS DIRECTED BY PHYSICIAN 02/05/16  Yes  Nilda Simmer, NP  albuterol (PROVENTIL HFA;VENTOLIN HFA) 108 (90 Base) MCG/ACT inhaler Inhale 1-2 puffs into the lungs 5 (five) times daily as needed for wheezing or shortness of breath. 05/28/16   Nat Christen, MD   BP 129/99 mmHg  Pulse 91  Temp(Src) 97.9 F (36.6 C) (Oral)  Resp 29  Ht 6\' 1"  (1.854 m)  Wt 233 lb (105.688 kg)  BMI 30.75 kg/m2  SpO2 99% Physical Exam  Constitutional: He is oriented to person, place, and time.  Overweight, no acute distress, no obvious dyspnea.  HENT:  Head: Normocephalic and atraumatic.  Eyes: Conjunctivae are normal.  Neck: Neck supple.  Cardiovascular: Normal rate and regular rhythm.   Pulmonary/Chest: Effort normal.  No rales appreciated.  Abdominal: Soft. Bowel sounds are normal.  Musculoskeletal: Normal range of motion.  Neurological: He is alert and oriented to person, place, and time.  Skin:  No peripheral edema  Psychiatric: He has a normal mood and affect. His behavior is normal.  Nursing note and vitals reviewed.   ED Course  Procedures (including critical care time) Labs Review Labs Reviewed  BASIC METABOLIC PANEL - Abnormal; Notable for the following:    Sodium 134 (*)    Chloride 99 (*)    Glucose, Bld 144 (*)    GFR calc non Af Amer 58 (*)    All other components within normal limits  BRAIN NATRIURETIC PEPTIDE - Abnormal; Notable for the following:    B Natriuretic Peptide 294.0 (*)    All other components within normal limits  CBC WITH DIFFERENTIAL/PLATELET  TROPONIN I    Imaging Review Dg Chest 2 View  05/28/2016  CLINICAL DATA:  Shortness of breath. EXAM: CHEST  2 VIEW COMPARISON:  Radiograph May 03, 2016. FINDINGS: Stable cardiomegaly. Mild bilateral perihilar and basilar interstitial densities are noted concerning for edema. Mild bilateral pleural effusions are noted. No pneumothorax is noted. Bony thorax is unremarkable. IMPRESSION: Mild bilateral pulmonary edema and pleural effusions are noted. Electronically  Signed   By: Marijo Conception, M.D.   On: 05/28/2016 16:26   I have personally reviewed and evaluated these images and lab results as part of my medical decision-making.   EKG Interpretation   Date/Time:  Saturday May 28 2016 15:26:18 EDT Ventricular Rate:  91 PR Interval:    QRS Duration: 107 QT Interval:  468 QTC Calculation: 576 R Axis:   6 Text Interpretation:  Atrial fibrillation Prolonged QT interval Baseline  wander in lead(s) V1 Confirmed by Cerenity Goshorn  MD, Lonny Eisen ([id number]) on 05/28/2016  3:54:02 PM      MDM   Final diagnoses:  Chronic pulmonary edema    Chest x-ray suggests mild pulmonary edema. Troponin is negative. EKG shows atrial fibrillation but this is not new. Will stay  on Lasix. Patient has cardiology follow-up next Wednesday. These findings were discussed with the patient and his friend.   Nat Christen, MD 05/28/16 [id number]

## 2016-05-28 NOTE — ED Notes (Signed)
Resp called for treatment

## 2016-05-28 NOTE — Discharge Instructions (Signed)
Pulmonary Edema Pulmonary edema is abnormal fluid buildup in the lungs that can make it hard to breathe. HOME CARE  Talk to your doctor about an exercise program.  Eat a healthy diet:  Eat fresh fruits, vegetables, and lean meats.  Limit high fat and salty foods.  Avoid processed, canned, or fried foods.  Avoid fast food.  Follow your doctor's advice about taking medicine and recording the medicine you take.  Follow your doctor's advice about keeping a record of your weight.  Talk to your doctor about keeping track of your blood pressure.  Do not smoke.  Do not use nicotine patches or nicotine gum.  Make a follow-up appointment with your doctor.  Ask your doctor for a copy of your latest heart tracing (ECG) and keep a copy with you at all times. GET HELP RIGHT AWAY IF:   You have chest pain. THIS IS AN EMERGENCY. Do not wait to see if the pain will go away. Call for local emergency medical help. Do not drive yourself to the hospital.  You have sweating, feel sick to your stomach (nauseous), or are experiencing shortness of breath.  Your weight increases more than your doctor tells you it should.  You start to have shortness of breath.  You notice more swelling in your hands, feet, ankles, or belly.  You have dizziness, blurred vision, headache, or unsteadiness that does not go away.  You cough up bloody spit.  You have a cough that does not go away.  You are unable to sleep because it is hard to breathe.  You begin to feel a "jumping" or "fluttering" sensation (palpitations) in the chest that is unusual for you. MAKE SURE YOU:   Understand these instructions.  Will watch your condition.  Will get help right away if you are not doing well or get worse.   This information is not intended to replace advice given to you by your health care provider. Make sure you discuss any questions you have with your health care provider.   Document Released: 10/19/2009  Document Revised: 11/05/2013 Document Reviewed: 07/08/2013 Elsevier Interactive Patient Education 2016 [place] have a small amount of fluid in your lungs.   Continue your lasix.  Decrease salt.  Rest

## 2016-05-28 NOTE — ED Notes (Signed)
HR of 31 is incorrect.  Current HR 90

## 2016-05-30 DIAGNOSIS — E119 Type 2 diabetes mellitus without complications: Secondary | ICD-10-CM | POA: Diagnosis not present

## 2016-05-30 DIAGNOSIS — E785 Hyperlipidemia, unspecified: Secondary | ICD-10-CM | POA: Diagnosis not present

## 2016-05-30 DIAGNOSIS — I481 Persistent atrial fibrillation: Secondary | ICD-10-CM | POA: Diagnosis not present

## 2016-05-30 DIAGNOSIS — R270 Ataxia, unspecified: Secondary | ICD-10-CM | POA: Diagnosis not present

## 2016-05-30 DIAGNOSIS — I10 Essential (primary) hypertension: Secondary | ICD-10-CM | POA: Diagnosis not present

## 2016-05-30 NOTE — Telephone Encounter (Signed)
HR today 05/30/16 was 82, at last visit 05/26/16  HR 78, Saturday  05/27/16 HR 87

## 2016-05-30 NOTE — Telephone Encounter (Signed)
Agree with Dr Malachy Moan assessment regarding lasix. Please forward [place] the results of the nursing visit, specifically heart rate as we have been working to get his afib under control which could play some role in fluid retention   Zandra Abts MD

## 2016-06-01 ENCOUNTER — Ambulatory Visit (INDEPENDENT_AMBULATORY_CARE_PROVIDER_SITE_OTHER): Payer: Medicare Other | Admitting: Cardiology

## 2016-06-01 ENCOUNTER — Ambulatory Visit: Payer: Medicare Other

## 2016-06-01 ENCOUNTER — Other Ambulatory Visit: Payer: Self-pay

## 2016-06-01 ENCOUNTER — Encounter: Payer: Self-pay | Admitting: Cardiology

## 2016-06-01 VITALS — BP 109/69 | HR 77 | Ht 73.0 in | Wt 229.0 lb

## 2016-06-01 DIAGNOSIS — I10 Essential (primary) hypertension: Secondary | ICD-10-CM | POA: Diagnosis not present

## 2016-06-01 DIAGNOSIS — I350 Nonrheumatic aortic (valve) stenosis: Secondary | ICD-10-CM | POA: Diagnosis not present

## 2016-06-01 DIAGNOSIS — I4891 Unspecified atrial fibrillation: Secondary | ICD-10-CM

## 2016-06-01 DIAGNOSIS — I6529 Occlusion and stenosis of unspecified carotid artery: Secondary | ICD-10-CM | POA: Diagnosis not present

## 2016-06-01 DIAGNOSIS — I071 Rheumatic tricuspid insufficiency: Secondary | ICD-10-CM

## 2016-06-01 DIAGNOSIS — R0602 Shortness of breath: Secondary | ICD-10-CM

## 2016-06-01 DIAGNOSIS — I5021 Acute systolic (congestive) heart failure: Secondary | ICD-10-CM | POA: Diagnosis not present

## 2016-06-01 MED ORDER — FUROSEMIDE 40 MG PO TABS: 40.0000 mg | ORAL_TABLET | Freq: Every day | ORAL | Status: DC

## 2016-06-01 NOTE — Patient Instructions (Signed)
Your physician recommends that you schedule a follow-up appointment in: [place] has recommended you make the following change in your medication:   INCREASE LASIX 40 MG DAILY  Your physician recommends that you return for lab work in: 2 WEEKS BMP/MG  Thank you for choosing [facility]!!

## 2016-06-01 NOTE — Progress Notes (Signed)
Clinical Summary [name] [name] is a 82 y.o.male seen today as an add on patient for ongoing SOB. This is a focused visit on ongoing SOB and recent abnormal echo findings.   1. Afib - denies any palpitations - on coumadin, INR followed by Dr Wolfgang Phoenix - last visit we increased his Toprol XL to 25mg  daily due to elevated rates, tolerating well.   2. Aortic stenosis - mild to moderate AS by echo in 2013 mean grad 18 AVA 1.16 - repeat echo today shows LVEF 35-40%, moderate to severe AS mean grad 17, AVA VTI 0.6, dimensionless index 0.16. Probable lower gradient due to low cardiac output due to low LVEF and significant MR.   3. Mitral regurgitation - moderate to severe by echo - ongoing SOB that is unchanged. Denies any significant LE edema  4. Acute systolic HF - new diagnosis, echo with LVEF 35-40%. Probably truly lower LVEF in setting of significant MR - SOB remains unchanged since last visit.     Past Medical History  Diagnosis Date  . Hypertension     Borderline  . Cerebrovascular disease 07/2010    TIA; carotid ultrasound in 07/2010-significant bilateral plaque without focal internal carotid artery stenosis; MRI -encephalomalacia left temporal and right temporal lobes; small inferior right cerebellar infarct; small vessel disease  . Hyperlipidemia     adverse reactions to statins and niacin  . History of noncompliance with medical treatment   . Aortic stenosis 2007    Very mild  . Hepatic steatosis   . Diabetes mellitus     no insulin; A1c of 6.6 in 2005  . Atrial fibrillation (HCC)     Paroxysmal; Echocardiogram in 2007-normal EF; mild LVH; left atrial enlargement; mild stenosis and minimal AI; negative stress nuclear study in 2008  . Exertional dyspnea   . Degenerative joint disease     feet and legs  . Dizziness     occurs daily,especially in am  . Gastroesophageal reflux disease   . Renal insufficiency   . Asthma   . Irregular heartbeat   . Peripheral vascular  disease ([place])   . Temporal arteritis (HCC)      Allergies  Allergen Reactions  . Ambien [Zolpidem Tartrate] Other (See Comments)    Sleep walks  . Cholestatin   . Lipitor [Atorvastatin Calcium] Other (See Comments)    myalgias  . Ranitidine Other (See Comments)    Chest discomfort  . Simvastatin Other (See Comments)    Myalgias  . Xanax Xr [Alprazolam Er]     Tightness in chest     Current Outpatient Prescriptions  Medication Sig Dispense Refill  . albuterol (PROVENTIL HFA;VENTOLIN HFA) 108 (90 Base) MCG/ACT inhaler Inhale 1-2 puffs into the lungs 5 (five) times daily as needed for wheezing or shortness of breath. 1 Inhaler 0  . furosemide (LASIX) 20 MG tablet Take 1 tablet (20 mg total) by mouth daily. 30 tablet 5  . glipiZIDE (GLUCOTROL) 5 MG tablet Take 5 mg by mouth daily before breakfast.     . HYDROcodone-acetaminophen (NORCO/VICODIN) 5-325 MG tablet Take 1 tablet by mouth 3 (three) times daily as needed for moderate pain. 90 tablet 0  . lisinopril (PRINIVIL,ZESTRIL) 2.5 MG tablet TAKE (1) TABLET BY MOUTH ONCE DAILY. 30 tablet 5  . metFORMIN (GLUCOPHAGE) 500 MG tablet 1/2 tablet twice a day 30 tablet 5  . metoprolol succinate (TOPROL-XL) 25 MG 24 hr tablet Take 1 tablet (25 mg total) by mouth daily. 90 tablet 3  .  nortriptyline (PAMELOR) 10 MG capsule TAKE 1 TO 2 CAPSULES AT BEDTIME FOR BURNING IN FEET. 60 capsule 5  . traZODone (DESYREL) 100 MG tablet Take 1 tablet (100 mg total) by mouth at bedtime. 30 tablet 5  . warfarin (COUMADIN) 5 MG tablet Take 1 tablet (5 mg total) by mouth daily. TAKE AS DIRECTED BY PHYSICIAN 30 tablet 5   No current facility-administered medications for this visit.     Past Surgical History  Procedure Laterality Date  . Rotator cuff repair      Right  . Lipoma excision  1980  . Urethral stricture dilatation  1980s  . Orif ankle fracture  2000    Right  . Colonoscopy  2002  . Transurethral resection of prostate  09/2011  . Colonoscopy   01/19/2012    Procedure: COLONOSCOPY;  Surgeon: Rogene Houston, MD;  [facility];  Service: Endoscopy;  Laterality: N/A;  1030  . Prostate surgery  12/2011     Allergies  Allergen Reactions  . Ambien [Zolpidem Tartrate] Other (See Comments)    Sleep walks  . Cholestatin   . Lipitor [Atorvastatin Calcium] Other (See Comments)    myalgias  . Ranitidine Other (See Comments)    Chest discomfort  . Simvastatin Other (See Comments)    Myalgias  . Xanax Xr [Alprazolam Er]     Tightness in chest      Family History  Problem Relation Age of Onset  . Stroke Mother   . Diabetes Father   . Colon cancer Neg Hx      Social History [name] [name] reports that he quit smoking about 24 years ago. His smoking use included Cigarettes. He started smoking about 65 years ago. He has a 20 pack-year smoking history. His smokeless tobacco use includes Chew. [name] [name] reports that he does not drink alcohol.   Review of Systems CONSTITUTIONAL: No weight loss, fever, chills, weakness or fatigue.  HEENT: Eyes: No visual loss, blurred vision, double vision or yellow sclerae.No hearing loss, sneezing, congestion, runny nose or sore throat.  SKIN: No rash or itching.  CARDIOVASCULAR: per HPI RESPIRATORY: per HPI GASTROINTESTINAL: No anorexia, nausea, vomiting or diarrhea. No abdominal pain or blood.  GENITOURINARY: No burning on urination, no polyuria NEUROLOGICAL: No headache, dizziness, syncope, paralysis, ataxia, numbness or tingling in the extremities. No change in bowel or bladder control.  MUSCULOSKELETAL: No muscle, back pain, joint pain or stiffness.  LYMPHATICS: No enlarged nodes. No history of splenectomy.  PSYCHIATRIC: No history of depression or anxiety.  ENDOCRINOLOGIC: No reports of sweating, cold or heat intolerance. No polyuria or polydipsia.  [name]   Physical Examination Filed Vitals:   06/01/16 1133  BP: 109/69  Pulse: 77   Filed Weights   06/01/16 1133  Weight: 229  lb (103.874 kg)    Gen: resting comfortably, no acute distress HEENT: no scleral icterus, pupils equal round and reactive, no palptable cervical adenopathy,  CV: RRR, 3/6 systolic murmur RUSB and apex, no jvd.  Resp: Clear to auscultation bilaterally GI: abdomen is soft, non-tender, non-distended, normal bowel sounds, no hepatosplenomegaly MSK: extremities are warm, no edema.  Skin: warm, no rash Neuro:  no focal deficits Psych: appropriate affect   Diagnostic Studies 04/2012 Nuclear stress test IMPRESSION: Negative pharmacologic stress nuclear myocardial study revealing no stress induced EKG abnormalities, normal left ventricular size and normal left ventricular systolic function. By scintigraphic imaging, there was a minimal area at the base of the inferior wall with apparently abnormal  perfusion, likely the result of attenuation artifact.   03/2012 echo Study Conclusions  - Left ventricle: The cavity size was normal. There was mild focal basal hypertrophy of the septum. Systolic function was low normal. The estimated ejection fraction was in the range of 50% to 55%. Although no diagnostic regional wall motion abnormality was identified, this possibility cannot be completely excluded on the basis of this study - cannot exclude inferior hypokinesis. The study is not technically sufficient to allow evaluation of LV diastolic function. - Aortic valve: Mildly calcified annulus. Trileaflet; moderately calcified leaflets. Cusp separation was reduced. There was mild stenosis. Trivial regurgitation. Mean gradient: 55mm Hg (S). Valve area: 1.16cm^2 (Vmax). - Mitral valve: Calcified annulus. Mildly thickened leaflets . Mild regurgitation. - Left atrium: The atrium was moderately dilated. - Atrial septum: There was increased thickness of the septum, consistent with lipomatous hypertrophy. - Tricuspid valve: Trivial regurgitation. - Pericardium,  extracardiac: There was no pericardial effusion.    Assessment and Plan  1. Afib - EKG in clinic shows afib rate controlled - continue beta blocker. CHADS2Vasc score of 5, continue coumadin  2. Aortic stenosis - severe by area and dimensionless index, mild to moderate based on gradients. Discrepancy is in the setting of decreased LVEF and significant MR with decreased overall cardiac output - will need further evaluation for possible AVR. Will first diurese him for symptom improvmenent, plan for TEE to further evaluate MR  3. Mitral regurgitation - moderate to severe by echo, somewhat eccentric so may be underestimated.  - increase lasix to 40mg  daily, once SOB imrpoves would plan for TEE to further evaluate - coexisting TR as well, we will further evaluate with TEE once less symptomatic  4. Acute systolic HF - new diagnosis, LVEF 35-40%. Potential etilogies included valvular heart diease, tachycardia medicated CM, and ICM. - continue toprol xl, lisinopril. Medication titration limited by soft bp's - increase lasix to 40mg  daily, check BMET and Mg in 2 weeks.  - will need LHC/RHC/AV study once less acutely symptomatic.    F/u 2 weeks. Asked to call and update [place] on his weights and symptoms with increased lasix.     Arnoldo Lenis, M.D.

## 2016-06-02 ENCOUNTER — Ambulatory Visit: Payer: Medicare Other | Admitting: Cardiology

## 2016-06-02 DIAGNOSIS — E119 Type 2 diabetes mellitus without complications: Secondary | ICD-10-CM | POA: Diagnosis not present

## 2016-06-02 DIAGNOSIS — R270 Ataxia, unspecified: Secondary | ICD-10-CM | POA: Diagnosis not present

## 2016-06-02 DIAGNOSIS — E785 Hyperlipidemia, unspecified: Secondary | ICD-10-CM | POA: Diagnosis not present

## 2016-06-02 DIAGNOSIS — I10 Essential (primary) hypertension: Secondary | ICD-10-CM | POA: Diagnosis not present

## 2016-06-02 DIAGNOSIS — I481 Persistent atrial fibrillation: Secondary | ICD-10-CM | POA: Diagnosis not present

## 2016-06-03 DIAGNOSIS — I481 Persistent atrial fibrillation: Secondary | ICD-10-CM | POA: Diagnosis not present

## 2016-06-03 DIAGNOSIS — R270 Ataxia, unspecified: Secondary | ICD-10-CM | POA: Diagnosis not present

## 2016-06-03 DIAGNOSIS — I10 Essential (primary) hypertension: Secondary | ICD-10-CM | POA: Diagnosis not present

## 2016-06-03 DIAGNOSIS — E785 Hyperlipidemia, unspecified: Secondary | ICD-10-CM | POA: Diagnosis not present

## 2016-06-03 DIAGNOSIS — E119 Type 2 diabetes mellitus without complications: Secondary | ICD-10-CM | POA: Diagnosis not present

## 2016-06-06 DIAGNOSIS — I10 Essential (primary) hypertension: Secondary | ICD-10-CM | POA: Diagnosis not present

## 2016-06-06 DIAGNOSIS — R270 Ataxia, unspecified: Secondary | ICD-10-CM | POA: Diagnosis not present

## 2016-06-06 DIAGNOSIS — E785 Hyperlipidemia, unspecified: Secondary | ICD-10-CM | POA: Diagnosis not present

## 2016-06-06 DIAGNOSIS — I481 Persistent atrial fibrillation: Secondary | ICD-10-CM | POA: Diagnosis not present

## 2016-06-06 DIAGNOSIS — E119 Type 2 diabetes mellitus without complications: Secondary | ICD-10-CM | POA: Diagnosis not present

## 2016-06-07 ENCOUNTER — Telehealth: Payer: Self-pay | Admitting: Family Medicine

## 2016-06-07 ENCOUNTER — Ambulatory Visit (INDEPENDENT_AMBULATORY_CARE_PROVIDER_SITE_OTHER): Payer: Medicare Other

## 2016-06-07 DIAGNOSIS — Z7901 Long term (current) use of anticoagulants: Secondary | ICD-10-CM | POA: Diagnosis not present

## 2016-06-07 DIAGNOSIS — I10 Essential (primary) hypertension: Secondary | ICD-10-CM | POA: Diagnosis not present

## 2016-06-07 DIAGNOSIS — E119 Type 2 diabetes mellitus without complications: Secondary | ICD-10-CM | POA: Diagnosis not present

## 2016-06-07 DIAGNOSIS — I481 Persistent atrial fibrillation: Secondary | ICD-10-CM | POA: Diagnosis not present

## 2016-06-07 DIAGNOSIS — R270 Ataxia, unspecified: Secondary | ICD-10-CM | POA: Diagnosis not present

## 2016-06-07 DIAGNOSIS — E785 Hyperlipidemia, unspecified: Secondary | ICD-10-CM | POA: Diagnosis not present

## 2016-06-07 LAB — POCT INR: INR: 3.8

## 2016-06-07 NOTE — Telephone Encounter (Signed)
Glucose readings look good keep all regular scheduled follow-up visits. Keep all medications as is.

## 2016-06-07 NOTE — Patient Instructions (Signed)
No Coumadin on Wednesday 06/08/16, or Thursday 06/09/16. Then Take 1/2 tablet on Tuesdays, Thursdays, and Saturdays. Take 1 whole tablet on all other days. Recheck INR 06/14/16

## 2016-06-07 NOTE — Telephone Encounter (Signed)
Patient dropped off glucose readings. Readings in yellow folder in [place] office.

## 2016-06-08 ENCOUNTER — Telehealth: Payer: Self-pay | Admitting: *Deleted

## 2016-06-08 ENCOUNTER — Encounter: Payer: Self-pay | Admitting: Physician Assistant

## 2016-06-08 ENCOUNTER — Telehealth: Payer: Self-pay | Admitting: Family Medicine

## 2016-06-08 DIAGNOSIS — I10 Essential (primary) hypertension: Secondary | ICD-10-CM | POA: Diagnosis not present

## 2016-06-08 DIAGNOSIS — I481 Persistent atrial fibrillation: Secondary | ICD-10-CM | POA: Diagnosis not present

## 2016-06-08 DIAGNOSIS — R270 Ataxia, unspecified: Secondary | ICD-10-CM | POA: Diagnosis not present

## 2016-06-08 DIAGNOSIS — E785 Hyperlipidemia, unspecified: Secondary | ICD-10-CM | POA: Diagnosis not present

## 2016-06-08 DIAGNOSIS — E119 Type 2 diabetes mellitus without complications: Secondary | ICD-10-CM | POA: Diagnosis not present

## 2016-06-08 NOTE — Progress Notes (Signed)
Cardiology Office Note    Date:  06/09/2016  ID:  [name] [name], DOB [birth year], MRN [id number] PCP:  Sallee Lange, MD  Cardiologist: Branch  Chief Complaint: BP dropping  History of Present Illness:  [name] [name] is a 82 y.o. male with history of chronic-appearing atrial fib, recently diagnosed systolic CHF, mitral regurg, mod-severe AS, TIA, HLD, hepatic steatosis, DM, GERD, PVD, temporal arteritis, CKD stage II, asthma who presents back for f/u CHF. He recently saw Dr. Harl Bowie for echocardiogram showing progressive valvular disease and decreased EF - 06/01/16 EF 35-40%, diffuse HK, mod-severe AS, mod gradient, severe AVA VTI likely due to decreased cardiac output in setting of systolic dysfsunction and significant mitral regurgitation, mild MR, mod-severe MR, severe LAE, mild-mod RV dilation, mild RAE, mild-mod TR, mod PASP 69mmHg. His Lasix was increased with plans to revisit further workup of valvular disease once less symptomatic. Last labs 05/28/16: BUN/Cr 19/1.14, Na 134, CBC WNL.  [facility] called because he was having issues with orthostatic hypotension - BP 132/60 to 98/58 upon standing. He was brought in the office today for a recheck. His SOB has improved. It has not fully resolved. He has mild orthopnea. His major complaint is dizziness. No LEE. No syncope. No chest pain. BP in clinic is 102/62 and HR 56.   He also reports continuing to lose weight steadily over the past few years. In 2015: 260s lbs. In 2016: 240s. Most recently: 233->229->216. He has not seen his PCP. He reports good appetite but his niece shakes her head.  Past Medical History:  Diagnosis Date  . Aortic stenosis    a. mod-sev by echo 05/2016.  . Asthma   . Cerebrovascular disease 07/2010   TIA; carotid ultrasound in 07/2010-significant bilateral plaque without focal internal carotid artery stenosis; MRI -encephalomalacia left temporal and right temporal lobes; small inferior right cerebellar infarct; small  vessel disease  . Chronic atrial fibrillation (HCC)    Paroxysmal; Echocardiogram in 2007-normal EF; mild LVH; left atrial enlargement; mild stenosis and minimal AI; negative stress nuclear study in 2008  . Chronic systolic CHF (congestive heart failure) ([place])    a. dx 05/2016 - EF 35-40%, diffuse HK, mod-severe AS, mod gradient, severe AVA VTI likely due to decreased cardiac output in setting of systolic dysfsunction and significant mitral regurgitation, mild MR, mod-severe MR, severe LAE, mild-mod RV dilation, mild RAE, mild-mod TR, mod PASP 62mmHg.  . CKD (chronic kidney disease), stage II   . Degenerative joint disease    feet and legs  . Diabetes mellitus    no insulin; A1c of 6.6 in 2005  . Dizziness    occurs daily,especially in am  . Exertional dyspnea   . Gastroesophageal reflux disease   . Hepatic steatosis   . History of noncompliance with medical treatment   . Hyperlipidemia    adverse reactions to statins and niacin  . Hypertension    Borderline  . Irregular heartbeat   . Mitral regurgitation    a. mod-sev by echo 05/2016  . Peripheral vascular disease ([place])   . Renal insufficiency   . Temporal arteritis ([place])   . Tricuspid regurgitation    a. mild-mod TR by echo 05/2016    Past Surgical History:  Procedure Laterality Date  . COLONOSCOPY  2002  . COLONOSCOPY  01/19/2012   Procedure: COLONOSCOPY;  Surgeon: Rogene Houston, MD;  [facility];  Service: Endoscopy;  Laterality: N/A;  1030  . LIPOMA EXCISION  1980  .  ORIF ANKLE FRACTURE  2000   Right  . PROSTATE SURGERY  12/2011  . ROTATOR CUFF REPAIR     Right  . TRANSURETHRAL RESECTION OF PROSTATE  09/2011  . URETHRAL STRICTURE DILATATION  1980s    Current Medications: Outpatient Medications Prior to Visit  Medication Sig Dispense Refill  . albuterol (PROVENTIL HFA;VENTOLIN HFA) 108 (90 Base) MCG/ACT inhaler Inhale 1-2 puffs into the lungs 5 (five) times daily as needed for wheezing or shortness of  breath. 1 Inhaler 0  . furosemide (LASIX) 40 MG tablet Take 1 tablet (40 mg total) by mouth daily. 90 tablet 3  . HYDROcodone-acetaminophen (NORCO/VICODIN) 5-325 MG tablet Take 1 tablet by mouth 3 (three) times daily as needed for moderate pain. 90 tablet 0  . lisinopril (PRINIVIL,ZESTRIL) 2.5 MG tablet TAKE (1) TABLET BY MOUTH ONCE DAILY. 30 tablet 5  . metFORMIN (GLUCOPHAGE) 500 MG tablet 1/2 tablet twice a day 30 tablet 5  . metoprolol succinate (TOPROL-XL) 25 MG 24 hr tablet Take 1 tablet (25 mg total) by mouth daily. 90 tablet 3  . nortriptyline (PAMELOR) 10 MG capsule TAKE 1 TO 2 CAPSULES AT BEDTIME FOR BURNING IN FEET. 60 capsule 5  . traZODone (DESYREL) 100 MG tablet Take 1 tablet (100 mg total) by mouth at bedtime. 30 tablet 5  . warfarin (COUMADIN) 5 MG tablet Take 1 tablet (5 mg total) by mouth daily. TAKE AS DIRECTED BY PHYSICIAN 30 tablet 5   No facility-administered medications prior to visit.      Allergies:   Ambien [zolpidem tartrate]; Cholestatin; Lipitor [atorvastatin calcium]; Ranitidine; Simvastatin; and Xanax xr Staci Acosta er]   Social History   Social History  . Marital status: Married    Spouse name: N/A  . Number of children: 1  . Years of education: N/A   Occupational History  . Retired    Social History Main Topics  . Smoking status: Former Smoker    Packs/day: 1.00    Years: 20.00    Types: Cigarettes    Start date: 07/04/1950    Quit date: 04/25/1992  . Smokeless tobacco: Current User    Types: Chew  . Alcohol use No  . Drug use: No  . Sexual activity: Not Currently   Other Topics Concern  . None   Social History Narrative  . None     Family History:  The patient's family history includes Diabetes in his father; Stroke in his mother.   ROS:   Please see the history of present illness.  All other systems are reviewed and otherwise negative.    PHYSICAL EXAM:   VS:  BP 102/62   Pulse (!) 56   Ht 6\' 1"  (1.854 m)   Wt 216 lb (98 kg)    SpO2 97%   BMI 28.50 kg/m   BMI: Body mass index is 28.5 kg/m. GEN: Well nourished, well developed elderly WM, in no acute distress  HEENT: normocephalic, atraumatic Neck: no JVD, carotid bruits, or masses Cardiac: irregularly irregular, soft SEM throughout, no edema  Respiratory:  clear to auscultation bilaterally, normal work of breathing GI: soft, nontender, nondistended, + BS MS: no deformity or atrophy  Skin: warm and dry, no rash Neuro:  Alert and Oriented x 3, Strength and sensation are intact, follows commands, hard of hearing Psych: euthymic mood, full affect  Wt Readings from Last 3 Encounters:  06/09/16 216 lb (98 kg)  06/01/16 229 lb (103.9 kg)  05/28/16 233 lb (105.7 kg)  Studies/Labs Reviewed:   EKG:  EKG was not ordered today.  Recent Labs: 05/09/2016: ALT 38 05/28/2016: B Natriuretic Peptide 294.0; BUN 19; Creatinine, Ser 1.14; Hemoglobin 13.9; Platelets 255; Potassium 3.7; Sodium 134   Lipid Panel    Component Value Date/Time   CHOL 225 (H) 04/13/2016 1010   TRIG 128 04/13/2016 1010   HDL 34 (L) 04/13/2016 1010   CHOLHDL 6.6 (H) 04/13/2016 1010   CHOLHDL 5.9 08/01/2014 1003   VLDL 49 (H) 08/01/2014 1003   LDLCALC 165 (H) 04/13/2016 1010    Additional studies/ records that were reviewed today include: Summarized above.    ASSESSMENT & PLAN:   1. Orthostatic hypotension - the patient was seen and examined by myself and Dr. Harl Bowie given his complex hx recently. Suspect he generally remains symptomatic due to his valvular disease - primarily dizziness and exertional dyspnea. See below for further discussion. Cut Lasix in half. Stop lisinopril. Continue to follow BP with home health. 2. Chronic systolic CHF (recently diagnosed) - diuresed well based on weights. SOB improved. Per Dr. Harl Bowie, patient appears better than when he saw him in clinic the other day. Recheck labs today. Med adjustment as above. 3. Valvular disease with aortic stenosis and  mitral regurgitation - he has reached the point where further eval is necessary. Per d/w MD, recommend TEE with [facility] and aortic valve study. Risks and benefits of cardiac catheterization and TEE have been discussed with the patient.  These include bleeding, infection, kidney damage, stroke, heart attack, death, esophageal perforation. The patient understands these risks and is willing to proceed. Prior records indicate the patient has had a TIA (as well as small stroke on prior imaging) thus we will ask pharmacist to help [place] with bridging the patient with Lovenox. Check pre-cath labs. Dr. Harl Bowie has requested either Dr. Angelena Form or Burt Knack do the cath to give their thoughts regarding his valvular disease. With his comorbidities and weight loss, traditional valve surgery would be more tenuous - however, with both mitral valve and aortic valve disease, TAVR may be less ideal as well. 4. Chronic atrial fib - bridge as above. Pt denies any bleeding.  5. CKD stage II - hold Lasix AM of cath. F/u BMET today.   Disposition: F/u with Dr Harl Bowie tentatively next week as scheduled. Also instructed to f/u PCP for weight loss.   Medication Adjustments/Labs and Tests Ordered: Current medicines are reviewed at length with the patient today.  Concerns regarding medicines are outlined above. Medication changes, Labs and Tests ordered today are summarized above and listed in the Patient Instructions accessible in Encounters.   Signed, [place] Location in [place], [place] [address] Ph: [phone]; Fax [phone]

## 2016-06-08 NOTE — Telephone Encounter (Signed)
Calling to request extension for PT 2 times weekly, for 3 additional weeks.  Also, would like clarification on coumadin instructions.

## 2016-06-08 NOTE — Telephone Encounter (Signed)
[name] [name] from [facility] is with pt today says pt is having orthostatic hypotention BP sitting 132/60 standing 98/58 HR 86 - says pt is dizzy when standing and SOB - noted in LOV with Dr. Harl Bowie. Nurse requesting that pt be seen sooner than next Friday. Scheduled with extender at [facility] tomorrow at 3pm.

## 2016-06-08 NOTE — Telephone Encounter (Signed)
Called stacey ( physcial therapist) and explained coumadin instructions. She states she will call pt and let him know. While she was there she states he was confused about how to take. She will call steve and let him know of instructions. I will also call and make sure steve knows.

## 2016-06-08 NOTE — Telephone Encounter (Signed)
See message below about physcial therapy.

## 2016-06-08 NOTE — Telephone Encounter (Signed)
#  1 may have approval of physical therapy #2 his caretakers must create a simple pillbox that lays out his Coumadin that he takes in the evening time. Failure of this patient to follow direction could actually increase her risk of a life-threatening bleed or stroke or premature death. It is highly important his caretakers lay out his medicine in a pillbox and monitor a he is properly taking his medicine. I would not trust the patient to figure out his own medications. Please document that caretakers have been informed of the importance of this

## 2016-06-08 NOTE — Telephone Encounter (Signed)
Discussed with steve on how pt should be taking coumadin.

## 2016-06-08 NOTE — Telephone Encounter (Signed)
Calling to requesting clarification on patients' coumadin instructions for this week.

## 2016-06-08 NOTE — Telephone Encounter (Signed)
Spoke with patient and informed him per Dr.Scott Luking- Glucose readings look good keep all regular scheduled follow-up visits. Keep all medications as is. Patient verbalized understanding.

## 2016-06-09 ENCOUNTER — Ambulatory Visit (INDEPENDENT_AMBULATORY_CARE_PROVIDER_SITE_OTHER): Payer: Medicare Other | Admitting: Physician Assistant

## 2016-06-09 ENCOUNTER — Encounter: Payer: Self-pay | Admitting: Physician Assistant

## 2016-06-09 ENCOUNTER — Other Ambulatory Visit: Payer: Self-pay | Admitting: *Deleted

## 2016-06-09 VITALS — BP 102/62 | HR 56 | Ht 73.0 in | Wt 216.0 lb

## 2016-06-09 DIAGNOSIS — I481 Persistent atrial fibrillation: Secondary | ICD-10-CM | POA: Diagnosis not present

## 2016-06-09 DIAGNOSIS — I482 Chronic atrial fibrillation, unspecified: Secondary | ICD-10-CM

## 2016-06-09 DIAGNOSIS — I5022 Chronic systolic (congestive) heart failure: Secondary | ICD-10-CM | POA: Diagnosis not present

## 2016-06-09 DIAGNOSIS — I10 Essential (primary) hypertension: Secondary | ICD-10-CM | POA: Diagnosis not present

## 2016-06-09 DIAGNOSIS — E785 Hyperlipidemia, unspecified: Secondary | ICD-10-CM | POA: Diagnosis not present

## 2016-06-09 DIAGNOSIS — I350 Nonrheumatic aortic (valve) stenosis: Secondary | ICD-10-CM | POA: Diagnosis not present

## 2016-06-09 DIAGNOSIS — E119 Type 2 diabetes mellitus without complications: Secondary | ICD-10-CM | POA: Diagnosis not present

## 2016-06-09 DIAGNOSIS — R270 Ataxia, unspecified: Secondary | ICD-10-CM | POA: Diagnosis not present

## 2016-06-09 DIAGNOSIS — I340 Nonrheumatic mitral (valve) insufficiency: Secondary | ICD-10-CM

## 2016-06-09 DIAGNOSIS — I951 Orthostatic hypotension: Secondary | ICD-10-CM

## 2016-06-09 DIAGNOSIS — I6529 Occlusion and stenosis of unspecified carotid artery: Secondary | ICD-10-CM

## 2016-06-09 MED ORDER — ENOXAPARIN SODIUM 150 MG/ML ~~LOC~~ SOLN: 150.0000 mg | SUBCUTANEOUS | 1 refills | Status: DC

## 2016-06-09 MED ORDER — FUROSEMIDE 20 MG PO TABS: 20.0000 mg | ORAL_TABLET | Freq: Every day | ORAL | 0 refills | Status: DC

## 2016-06-09 NOTE — Telephone Encounter (Signed)
[place] 06/09/16 (voicemail box full)

## 2016-06-09 NOTE — Patient Instructions (Addendum)
Your physician has recommended you make the following change in your medication:  STOP Taking Lisinopril  Decrease Lasix to 20 mg Daily    Your physician recommends that you have lab work in done today.  Your physician has requested that you have a cardiac catheterization. Cardiac catheterization is used to diagnose and/or treat various heart conditions. Doctors may recommend this procedure for a number of different reasons. The most common reason is to evaluate chest pain. Chest pain can be a symptom of coronary artery disease (CAD), and cardiac catheterization can show whether plaque is narrowing or blocking your heart's arteries. This procedure is also used to evaluate the valves, as well as measure the blood flow and oxygen levels in different parts of your heart. For further information please visit [email or web address]. Please follow instruction sheet, as given.  Your physician has requested that you have a TEE. During a TEE, sound waves are used to create images of your heart. It provides your doctor with information about the size and shape of your heart and how well your heart's chambers and valves are working. In this test, a transducer is attached to the end of a flexible tube that's guided down your throat and into your esophagus (the tube leading from you mouth to your stomach) to get a more detailed image of your heart. You are not awake for the procedure. Please see the instruction sheet given to you today. For further information please visit [email or web address].  Do NOT TAKE COUMADIN ON SUNDAY  If you need a refill on your cardiac medications before your next appointment, please call your pharmacy.  Thank you for choosing [facility]!

## 2016-06-09 NOTE — Telephone Encounter (Signed)
Spoke with patient's nephew and Richardson Landry and informed him not to take coumadin today, and to resume tomorrow and ensure patient takes coumadin every evening instead of the morning. The patient verbalized understanding and stated that he will inform patient and that the family had laid his pills out for how is is supposed to take them.

## 2016-06-10 ENCOUNTER — Telehealth: Payer: Self-pay | Admitting: *Deleted

## 2016-06-10 LAB — CBC WITH DIFFERENTIAL/PLATELET
BASOS ABS: 72 {cells}/uL (ref 0–200)
BASOS PCT: 1 %
EOS ABS: 216 {cells}/uL (ref 15–500)
Eosinophils Relative: 3 %
HCT: 46.5 % (ref 38.5–50.0)
Hemoglobin: 15.2 g/dL (ref 13.2–17.1)
LYMPHS ABS: 1440 {cells}/uL (ref 850–3900)
Lymphocytes Relative: 20 %
MCH: 29.3 pg (ref 27.0–33.0)
MCHC: 32.7 g/dL (ref 32.0–36.0)
MCV: 89.6 fL (ref 80.0–100.0)
MONOS PCT: 8 %
MPV: 10.6 fL (ref 7.5–12.5)
Monocytes Absolute: 576 cells/uL (ref 200–950)
NEUTROS ABS: 4896 {cells}/uL (ref 1500–7800)
Neutrophils Relative %: 68 %
PLATELETS: 256 10*3/uL (ref 140–400)
RBC: 5.19 MIL/uL (ref 4.20–5.80)
RDW: 15.3 % — AB (ref 11.0–15.0)
WBC: 7.2 10*3/uL (ref 3.8–10.8)

## 2016-06-10 LAB — PROTIME-INR
INR: 1.6 — ABNORMAL HIGH
Prothrombin Time: 17 s — ABNORMAL HIGH (ref 9.0–11.5)

## 2016-06-10 LAB — BASIC METABOLIC PANEL
BUN: 21 mg/dL (ref 7–25)
CHLORIDE: 100 mmol/L (ref 98–110)
CO2: 30 mmol/L (ref 20–31)
CREATININE: 1.31 mg/dL — AB (ref 0.70–1.11)
Calcium: 10.1 mg/dL (ref 8.6–10.3)
Glucose, Bld: 153 mg/dL — ABNORMAL HIGH (ref 65–99)
POTASSIUM: 5.2 mmol/L (ref 3.5–5.3)
SODIUM: 142 mmol/L (ref 135–146)

## 2016-06-10 NOTE — Telephone Encounter (Signed)
Wasn't sure if things changed but Dr. Harl Bowie requested the cath be done by Dr. Angelena Form or [name] if that's a possibility. Were they unavailable?

## 2016-06-10 NOTE — Telephone Encounter (Signed)
Patient scheduled on August 4 for TEE Case # [id number] at 8:00 am and  Cath at 10:30 am with Dr. Irish Lack. Patient will come for coumadin bridge on Monday with Lattie Haw at 7:30 am.

## 2016-06-13 ENCOUNTER — Ambulatory Visit (INDEPENDENT_AMBULATORY_CARE_PROVIDER_SITE_OTHER): Payer: Medicare Other | Admitting: *Deleted

## 2016-06-13 ENCOUNTER — Encounter: Payer: Self-pay | Admitting: *Deleted

## 2016-06-13 ENCOUNTER — Telehealth: Payer: Self-pay | Admitting: *Deleted

## 2016-06-13 DIAGNOSIS — Z01818 Encounter for other preprocedural examination: Secondary | ICD-10-CM

## 2016-06-13 DIAGNOSIS — Z7901 Long term (current) use of anticoagulants: Secondary | ICD-10-CM

## 2016-06-13 DIAGNOSIS — I4891 Unspecified atrial fibrillation: Secondary | ICD-10-CM | POA: Diagnosis not present

## 2016-06-13 LAB — POCT INR: INR: 1.4

## 2016-06-13 NOTE — Telephone Encounter (Signed)
-----   Message from Charlie Pitter, [place] sent at 06/13/2016  1:30 PM EDT ----- Alda Berthold - Please call patient. Labs show borderline elevated potassium and creatinine but we made some med changes at his office visit that should help these. PT/INR being managed by Coumadin clinic. Can you touch base with Dr. Harl Bowie to find out if he wants to trend BMET as outpatient prior to cath or just recheck on the day of the cath? Dayna Dunn PA-C

## 2016-06-13 NOTE — Patient Instructions (Signed)
Labs: 05/28/16  SCr 1.14  CrCl 59.88  Wt: 99kg  7/29  Took last dose of coumadin 7/30  No Lovenox or coumadin 7/31  Lovenox 150mg  sq 8:00am 8/1   Lovenox 150mg  sq 8:00am 8/2   Lovenox 150mg  sq 8:00am 8/3   Lovenox 150mg  sq 8:00am 8/4  No Lovenox-------TEE and Heart Cath-------coumadin 5mg  pm 8/5   Lovenox 150mg  sq 8:00am and coumadin 5mg  pm 8/6   Lovenox 150mg  sq [id number] and coumadin 5mg  pm 8/7   Lovenox 150mg  sq 8:00am and INR appt at 9:10am  Lovenox 150mg  sq #10 syringes x 1 refill to [facility]

## 2016-06-14 ENCOUNTER — Other Ambulatory Visit: Payer: Self-pay | Admitting: Physician Assistant

## 2016-06-14 ENCOUNTER — Telehealth: Payer: Self-pay | Admitting: Physician Assistant

## 2016-06-14 DIAGNOSIS — E119 Type 2 diabetes mellitus without complications: Secondary | ICD-10-CM | POA: Diagnosis not present

## 2016-06-14 DIAGNOSIS — I481 Persistent atrial fibrillation: Secondary | ICD-10-CM | POA: Diagnosis not present

## 2016-06-14 DIAGNOSIS — E785 Hyperlipidemia, unspecified: Secondary | ICD-10-CM | POA: Diagnosis not present

## 2016-06-14 DIAGNOSIS — R270 Ataxia, unspecified: Secondary | ICD-10-CM | POA: Diagnosis not present

## 2016-06-14 DIAGNOSIS — I10 Essential (primary) hypertension: Secondary | ICD-10-CM | POA: Diagnosis not present

## 2016-06-14 MED ORDER — INSULIN ASPART 100 UNIT/ML ~~LOC~~ SOLN: 0.0000 [IU] | SUBCUTANEOUS | Status: DC

## 2016-06-14 NOTE — Telephone Encounter (Signed)
Thank you for all of your help.   

## 2016-06-14 NOTE — Telephone Encounter (Signed)
Can you make sure patient is aware to hold his metformin starting night before cath? I've written orders today - we are only getting KVO fluids pre-cath and rechecking labs the AM of cath. Thanks. Dayna Dunn PA-C

## 2016-06-14 NOTE — Telephone Encounter (Signed)
Pt had written instructions to hold metformin before cath,Kisha LPN,called and spoke with niece

## 2016-06-15 ENCOUNTER — Ambulatory Visit: Payer: Medicare Other

## 2016-06-16 ENCOUNTER — Ambulatory Visit: Payer: Medicare Other

## 2016-06-16 DIAGNOSIS — I481 Persistent atrial fibrillation: Secondary | ICD-10-CM | POA: Diagnosis not present

## 2016-06-16 DIAGNOSIS — I10 Essential (primary) hypertension: Secondary | ICD-10-CM | POA: Diagnosis not present

## 2016-06-16 DIAGNOSIS — E785 Hyperlipidemia, unspecified: Secondary | ICD-10-CM | POA: Diagnosis not present

## 2016-06-16 DIAGNOSIS — R270 Ataxia, unspecified: Secondary | ICD-10-CM | POA: Diagnosis not present

## 2016-06-16 DIAGNOSIS — E119 Type 2 diabetes mellitus without complications: Secondary | ICD-10-CM | POA: Diagnosis not present

## 2016-06-17 ENCOUNTER — Ambulatory Visit (HOSPITAL_COMMUNITY)
Admission: RE | Admit: 2016-06-17 | Discharge: 2016-06-17 | Disposition: A | Discharge status: Home / Self Care | Payer: Medicare Other | Source: Ambulatory Visit | Attending: Interventional Cardiology | Admitting: Interventional Cardiology

## 2016-06-17 ENCOUNTER — Encounter (HOSPITAL_COMMUNITY)
Admission: RE | Disposition: A | Discharge status: Home / Self Care | Payer: Self-pay | Source: Ambulatory Visit | Attending: Interventional Cardiology

## 2016-06-17 ENCOUNTER — Encounter (HOSPITAL_COMMUNITY): Payer: Self-pay

## 2016-06-17 ENCOUNTER — Ambulatory Visit: Payer: Medicare Other | Admitting: Cardiology

## 2016-06-17 ENCOUNTER — Ambulatory Visit (HOSPITAL_BASED_OUTPATIENT_CLINIC_OR_DEPARTMENT_OTHER)
Admission: RE | Admit: 2016-06-17 | Discharge: 2016-06-17 | Disposition: A | Discharge status: Home / Self Care | Payer: Medicare Other | Source: Ambulatory Visit | Attending: Physician Assistant | Admitting: Physician Assistant

## 2016-06-17 DIAGNOSIS — Z87891 Personal history of nicotine dependence: Secondary | ICD-10-CM | POA: Diagnosis not present

## 2016-06-17 DIAGNOSIS — Z7901 Long term (current) use of anticoagulants: Secondary | ICD-10-CM | POA: Insufficient documentation

## 2016-06-17 DIAGNOSIS — E1122 Type 2 diabetes mellitus with diabetic chronic kidney disease: Secondary | ICD-10-CM | POA: Diagnosis not present

## 2016-06-17 DIAGNOSIS — I080 Rheumatic disorders of both mitral and aortic valves: Secondary | ICD-10-CM | POA: Diagnosis not present

## 2016-06-17 DIAGNOSIS — I5022 Chronic systolic (congestive) heart failure: Secondary | ICD-10-CM | POA: Insufficient documentation

## 2016-06-17 DIAGNOSIS — I350 Nonrheumatic aortic (valve) stenosis: Secondary | ICD-10-CM

## 2016-06-17 DIAGNOSIS — Z8673 Personal history of transient ischemic attack (TIA), and cerebral infarction without residual deficits: Secondary | ICD-10-CM | POA: Insufficient documentation

## 2016-06-17 DIAGNOSIS — I951 Orthostatic hypotension: Secondary | ICD-10-CM | POA: Diagnosis not present

## 2016-06-17 DIAGNOSIS — Z7984 Long term (current) use of oral hypoglycemic drugs: Secondary | ICD-10-CM | POA: Insufficient documentation

## 2016-06-17 DIAGNOSIS — I130 Hypertensive heart and chronic kidney disease with heart failure and stage 1 through stage 4 chronic kidney disease, or unspecified chronic kidney disease: Secondary | ICD-10-CM | POA: Insufficient documentation

## 2016-06-17 DIAGNOSIS — E785 Hyperlipidemia, unspecified: Secondary | ICD-10-CM | POA: Diagnosis not present

## 2016-06-17 DIAGNOSIS — I2510 Atherosclerotic heart disease of native coronary artery without angina pectoris: Secondary | ICD-10-CM

## 2016-06-17 DIAGNOSIS — N182 Chronic kidney disease, stage 2 (mild): Secondary | ICD-10-CM | POA: Diagnosis not present

## 2016-06-17 DIAGNOSIS — K219 Gastro-esophageal reflux disease without esophagitis: Secondary | ICD-10-CM | POA: Diagnosis not present

## 2016-06-17 DIAGNOSIS — I482 Chronic atrial fibrillation: Secondary | ICD-10-CM | POA: Insufficient documentation

## 2016-06-17 HISTORY — PX: TEE WITHOUT CARDIOVERSION: SHX5443

## 2016-06-17 LAB — POCT I-STAT 3, VENOUS BLOOD GAS (G3P V)
ACID-BASE EXCESS: 3 mmol/L — AB (ref 0.0–2.0)
Acid-Base Excess: 3 mmol/L — ABNORMAL HIGH (ref 0.0–2.0)
BICARBONATE: 29.2 meq/L — AB (ref 20.0–24.0)
Bicarbonate: 29.5 mEq/L — ABNORMAL HIGH (ref 20.0–24.0)
O2 Saturation: 52 %
O2 Saturation: 52 %
PCO2 VEN: 49.4 mmHg (ref 45.0–50.0)
PH VEN: 7.384 — AB (ref 7.250–7.300)
PH VEN: 7.384 — AB (ref 7.250–7.300)
PO2 VEN: 29 mmHg — AB (ref 31.0–45.0)
PO2 VEN: 29 mmHg — AB (ref 31.0–45.0)
TCO2: 31 mmol/L (ref 0–100)
TCO2: 31 mmol/L (ref 0–100)
pCO2, Ven: 49 mmHg (ref 45.0–50.0)

## 2016-06-17 LAB — POCT I-STAT 3, ART BLOOD GAS (G3+)
Acid-Base Excess: 3 mmol/L — ABNORMAL HIGH (ref 0.0–2.0)
Bicarbonate: 27.4 mEq/L — ABNORMAL HIGH (ref 20.0–24.0)
O2 Saturation: 96 %
PCO2 ART: 38.7 mmHg (ref 35.0–45.0)
PH ART: 7.458 — AB (ref 7.350–7.450)
TCO2: 29 mmol/L (ref 0–100)
pO2, Arterial: 80 mmHg (ref 80.0–100.0)

## 2016-06-17 LAB — GLUCOSE, CAPILLARY
GLUCOSE-CAPILLARY: 145 mg/dL — AB (ref 65–99)
Glucose-Capillary: 156 mg/dL — ABNORMAL HIGH (ref 65–99)

## 2016-06-17 LAB — POCT ACTIVATED CLOTTING TIME: Activated Clotting Time: 202 seconds

## 2016-06-17 LAB — PROTIME-INR
INR: 1.05
PROTHROMBIN TIME: 13.8 s (ref 11.4–15.2)

## 2016-06-17 SURGERY — ECHOCARDIOGRAM, TRANSESOPHAGEAL
Anesthesia: Moderate Sedation

## 2016-06-17 SURGERY — RIGHT HEART CATH AND CORONARY ANGIOGRAPHY

## 2016-06-17 MED ORDER — MIDAZOLAM HCL 5 MG/ML IJ SOLN
INTRAMUSCULAR | Status: AC
  Filled 2016-06-17: qty 2

## 2016-06-17 MED ORDER — IOPAMIDOL (ISOVUE-370) INJECTION 76%
INTRAVENOUS | Status: DC | PRN
  Administered 2016-06-17: 50 mL via INTRA_ARTERIAL

## 2016-06-17 MED ORDER — BUTAMBEN-TETRACAINE-BENZOCAINE 2-2-14 % EX AERO
INHALATION_SPRAY | CUTANEOUS | Status: DC | PRN
  Administered 2016-06-17: 2 via TOPICAL

## 2016-06-17 MED ORDER — SODIUM CHLORIDE 0.9% FLUSH: 3.0000 mL | INTRAVENOUS | Status: DC | PRN

## 2016-06-17 MED ORDER — MIDAZOLAM HCL 10 MG/2ML IJ SOLN
INTRAMUSCULAR | Status: DC | PRN
  Administered 2016-06-17 (×2): 2 mg via INTRAVENOUS

## 2016-06-17 MED ORDER — IOPAMIDOL (ISOVUE-370) INJECTION 76%
INTRAVENOUS | Status: AC
  Filled 2016-06-17: qty 100

## 2016-06-17 MED ORDER — HEPARIN (PORCINE) IN NACL 2-0.9 UNIT/ML-% IJ SOLN
INTRAMUSCULAR | Status: DC | PRN
  Administered 2016-06-17: 1000 mL

## 2016-06-17 MED ORDER — SODIUM CHLORIDE 0.9 % IV SOLN
INTRAVENOUS | Status: DC
Start: 2016-06-17 — End: 2016-06-17
  Administered 2016-06-17: 500 mL via INTRAVENOUS

## 2016-06-17 MED ORDER — LIDOCAINE HCL (PF) 1 % IJ SOLN
INTRAMUSCULAR | Status: DC | PRN
  Administered 2016-06-17: 25 mL

## 2016-06-17 MED ORDER — HEPARIN SODIUM (PORCINE) 1000 UNIT/ML IJ SOLN
INTRAMUSCULAR | Status: AC
  Filled 2016-06-17: qty 1

## 2016-06-17 MED ORDER — SODIUM CHLORIDE 0.9 % IV SOLN: INTRAVENOUS | Status: DC

## 2016-06-17 MED ORDER — HEPARIN SODIUM (PORCINE) 1000 UNIT/ML IJ SOLN
INTRAMUSCULAR | Status: DC | PRN
  Administered 2016-06-17: 5000 [IU] via INTRAVENOUS

## 2016-06-17 MED ORDER — VERAPAMIL HCL 2.5 MG/ML IV SOLN
INTRAVENOUS | Status: AC
  Filled 2016-06-17: qty 2

## 2016-06-17 MED ORDER — SODIUM CHLORIDE 0.9 % IV SOLN: 250.0000 mL | INTRAVENOUS | Status: DC | PRN

## 2016-06-17 MED ORDER — HEPARIN (PORCINE) IN NACL 2-0.9 UNIT/ML-% IJ SOLN
INTRAMUSCULAR | Status: DC | PRN
  Administered 2016-06-17: 10 mL via INTRA_ARTERIAL

## 2016-06-17 MED ORDER — LIDOCAINE VISCOUS 2 % MT SOLN
OROMUCOSAL | Status: AC
  Filled 2016-06-17: qty 15

## 2016-06-17 MED ORDER — METFORMIN HCL 500 MG PO TABS: ORAL_TABLET | ORAL | 5 refills | Status: DC

## 2016-06-17 MED ORDER — ENOXAPARIN SODIUM 150 MG/ML ~~LOC~~ SOLN: 150.0000 mg | SUBCUTANEOUS | 1 refills | Status: DC

## 2016-06-17 MED ORDER — ASPIRIN 81 MG PO CHEW
81.0000 mg | CHEWABLE_TABLET | ORAL | Status: DC
  Administered 2016-06-17: 81 mg via ORAL
  Filled 2016-06-17: qty 1

## 2016-06-17 MED ORDER — SODIUM CHLORIDE 0.9% FLUSH: 3.0000 mL | Freq: Two times a day (BID) | INTRAVENOUS | Status: DC

## 2016-06-17 MED ORDER — FENTANYL CITRATE (PF) 100 MCG/2ML IJ SOLN
INTRAMUSCULAR | Status: AC
  Filled 2016-06-17: qty 2

## 2016-06-17 MED ORDER — FENTANYL CITRATE (PF) 100 MCG/2ML IJ SOLN
INTRAMUSCULAR | Status: DC | PRN
  Administered 2016-06-17 (×2): 25 ug via INTRAVENOUS

## 2016-06-17 SURGICAL SUPPLY — 12 items
CATH INFINITI 5 FR JL3.5 (CATHETERS) ×2 IMPLANT
CATH INFINITI JR4 5F (CATHETERS) ×2 IMPLANT
CATH SWAN GANZ 7F STRAIGHT (CATHETERS) ×2 IMPLANT
DEVICE RAD COMP TR BAND LRG (VASCULAR PRODUCTS) ×2 IMPLANT
GLIDESHEATH SLEND SS 6F .021 (SHEATH) ×2 IMPLANT
KIT HEART LEFT (KITS) ×3 IMPLANT
PACK CARDIAC CATHETERIZATION (CUSTOM PROCEDURE TRAY) ×3 IMPLANT
SHEATH PINNACLE 7F 10CM (SHEATH) ×2 IMPLANT
TRANSDUCER W/STOPCOCK (MISCELLANEOUS) ×6 IMPLANT
TUBING CIL FLEX 10 FLL-RA (TUBING) ×3 IMPLANT
WIRE EMERALD ST .035X150CM (WIRE) ×2 IMPLANT
WIRE SAFE-T 1.5MM-J .035X260CM (WIRE) ×2 IMPLANT

## 2016-06-17 NOTE — Progress Notes (Signed)
Echocardiogram Echocardiogram Pharmacologic Stress Test has been performed.  [name] [name] 06/17/2016, 9:30 AM

## 2016-06-17 NOTE — Telephone Encounter (Signed)
-----   Message from Arnoldo Lenis, MD sent at 06/17/2016  3:08 PM EDT ----- Can we refer this patient to Dr Roxy Manns CT surgery to be evaluated for CAD/Aortic stenosis/Mitral regurgitation. Let him know based on his testing he will need to be considered for possible surgery for all 3 issues.  Zandra Abts MD

## 2016-06-17 NOTE — Interval H&P Note (Signed)
Cath Lab Visit (complete for each Cath Lab visit)  Clinical Evaluation Leading to the Procedure:   ACS: No.  Non-ACS:    Anginal Classification: CCS III  Anti-ischemic medical therapy: Minimal Therapy (1 class of medications)  Non-Invasive Test Results: Intermediate-risk stress test findings: cardiac mortality 1-3%/year  Prior CABG: No previous CABG   TEE  Performed earlier today.  He has already received some sedation.   History and Physical Interval Note:  06/17/2016 10:19 AM  [name] [name]  has presented today for surgery, with the diagnosis of aortic stenosis, mitrial regurgetation  The various methods of treatment have been discussed with the patient and family. After consideration of risks, benefits and other options for treatment, the patient has consented to  Procedure(s): Right/Left Heart Cath and Coronary Angiography (N/A) as a surgical intervention .  The patient's history has been reviewed, patient examined, no change in status, stable for surgery.  I have reviewed the patient's chart and labs.  Questions were answered to the patient's satisfaction.     Larae Grooms

## 2016-06-17 NOTE — Progress Notes (Signed)
Site area: rt groin fv sheath pulled by [place] Site Prior to Removal:  Level 0 Pressure Applied For:  10 minutes Manual:   yes Patient Status During Pull:  stable Post Pull Site:  Level  0 Post Pull Instructions Given:  yes Post Pull Pulses Present: yes Dressing Applied:  tegaderm Bedrest begins @  1130 Comments:

## 2016-06-17 NOTE — CV Procedure (Addendum)
     TEE  Indications: Systolic heart failure, evaluate aortic valve and mitral valve.  Timeout performed  Findings:  LV: Ejection fraction 30-35% global hypokinesis  Aortic valve: Aortic valve stenosis appears severe. Valve is heavily calcified, severely restricted. The right and non-coronary cusp are essentially fused. Peak velocity 3.2 m/s with mean gradient of 22 mmHg is underestimated because of decreased cardiac output with reduced ejection fraction. Dimensionless index, VTI, is 0.2 (less than 0.25 is considered severe aortic stenosis). Planimetry is 0.8 cm. Valve area VTI is 0.7 cm CONSISTENT with severe aortic stenosis.  There is also mild aortic regurgitation  Mitral valve: Mitral valve regurgitation appears moderate to severe, likely more severe than moderate given the eccentric posteriorly directed jet and color-flow Doppler encompassing approximate 40% of left atrial dimension as well as systolic blunting of pulmonary vein pulse-wave Doppler. PISA measurement is likely inaccurate based upon several factors however radius is measured 0.7 cm.  Pulmonic valve: Mild regurgitation  Aorta: No evidence of aneurysm thoracic. Mild to moderate aortic atherosclerosis diffuse.  Tricuspid valve: No significant regurgitation.  Left atrium: Dilated, no thrombus in left atrial appendage. Spontaneous contrast "smoke" noted.  Please see full echo report for details  Findings were relayed to family.  Candee Furbish, MD

## 2016-06-17 NOTE — Interval H&P Note (Signed)
History and Physical Interval Note:  06/17/2016 7:03 AM  [name] [name]  has presented today for surgery, with the diagnosis of AORTIC STENOSIS,MITRAL REGURGITATION  The various methods of treatment have been discussed with the patient and family. After consideration of risks, benefits and other options for treatment, the patient has consented to  Procedure(s): TRANSESOPHAGEAL ECHOCARDIOGRAM (TEE) (N/A) as a surgical intervention .  The patient's history has been reviewed, patient examined, no change in status, stable for surgery.  I have reviewed the patient's chart and labs.  Questions were answered to the patient's satisfaction.     [employer]

## 2016-06-17 NOTE — Discharge Instructions (Signed)
Venogram, Care After Refer to this sheet in the next few weeks. These instructions provide you with information on caring for yourself after your procedure. Your health care provider may also give you more specific instructions. Your treatment has been planned according to current medical practices, but problems sometimes occur. Call your health care provider if you have any problems or questions after your procedure. WHAT TO EXPECT AFTER THE PROCEDURE After your procedure, it is typical to have the following sensations:  Mild discomfort at the catheter insertion site. HOME CARE INSTRUCTIONS   Take all medicines exactly as directed.  Follow any prescribed diet.  Follow instructions regarding both rest and physical activity.  Drink more fluids for the first several days after the procedure in order to help flush dye from your kidneys. SEEK MEDICAL CARE IF:  You develop a rash.  You have fever not controlled by medicine. SEEK IMMEDIATE MEDICAL CARE IF:  There is pain, drainage, bleeding, redness, swelling, warmth or a red streak at the site of the IV tube.  The extremity where your IV tube was placed becomes discolored, numb, or cool.  You have difficulty breathing or shortness of breath.  You develop chest pain.  You have excessive dizziness or fainting.   This information is not intended to replace advice given to you by your health care provider. Make sure you discuss any questions you have with your health care provider.   Document Released: 08/21/2013 Document Revised: 11/05/2013 Document Reviewed: 08/21/2013 Elsevier Interactive Patient Education 2016 [place] Refer to this sheet in the next few weeks. These instructions provide you with information about caring for yourself after your procedure. Your health care provider may also give you more specific instructions. Your treatment has been planned according to current medical practices, but problems  sometimes occur. Call your health care provider if you have any problems or questions after your procedure. WHAT TO EXPECT AFTER THE PROCEDURE After your procedure, it is typical to have the following:  Bruising at the radial site that usually fades within 1-2 weeks.  Blood collecting in the tissue (hematoma) that may be painful to the touch. It should usually decrease in size and tenderness within 1-2 weeks. HOME CARE INSTRUCTIONS  Take medicines only as directed by your health care provider.  You may shower 24-48 hours after the procedure or as directed by your health care provider. Remove the bandage (dressing) and gently wash the site with plain soap and water. Pat the area dry with a clean towel. Do not rub the site, because this may cause bleeding.  Do not take baths, swim, or use a hot tub until your health care provider approves.  Check your insertion site every day for redness, swelling, or drainage.  Do not apply powder or lotion to the site.  Do not flex or bend the affected arm for 24 hours or as directed by your health care provider.  Do not push or pull heavy objects with the affected arm for 24 hours or as directed by your health care provider.  Do not lift over 10 lb (4.5 kg) for 5 days after your procedure or as directed by your health care provider.  Ask your health care provider when it is okay to:  Return to work or school.  Resume usual physical activities or sports.  Resume sexual activity.  Do not drive home if you are discharged the same day as the procedure. Have someone else drive you.  You may  drive 24 hours after the procedure unless otherwise instructed by your health care provider.  Do not operate machinery or power tools for 24 hours after the procedure.  If your procedure was done as an outpatient procedure, which means that you went home the same day as your procedure, a responsible adult should be with you for the first 24 hours after you  arrive home.  Keep all follow-up visits as directed by your health care provider. This is important. SEEK MEDICAL CARE IF:  You have a fever.  You have chills.  You have increased bleeding from the radial site. Hold pressure on the site. SEEK IMMEDIATE MEDICAL CARE IF:  You have unusual pain at the radial site.  You have redness, warmth, or swelling at the radial site.  You have drainage (other than a small amount of blood on the dressing) from the radial site.  The radial site is bleeding, and the bleeding does not stop after 30 minutes of holding steady pressure on the site.  Your arm or hand becomes pale, cool, tingly, or numb.   This information is not intended to replace advice given to you by your health care provider. Make sure you discuss any questions you have with your health care provider.   Document Released: 12/03/2010 Document Revised: 11/21/2014 Document Reviewed: 05/19/2014 Elsevier Interactive Patient Education [employer].

## 2016-06-17 NOTE — H&P (View-Only) (Signed)
Cardiology Office Note    Date:  06/09/2016  ID:  [name] [name], DOB December 23, 1931, MRN [id number] PCP:  Sallee Lange, MD  Cardiologist: Branch  Chief Complaint: BP dropping  History of Present Illness:  [name] [name] is a 82 y.o. male with history of chronic-appearing atrial fib, recently diagnosed systolic CHF, mitral regurg, mod-severe AS, TIA, HLD, hepatic steatosis, DM, GERD, PVD, temporal arteritis, CKD stage II, asthma who presents back for f/u CHF. He recently saw Dr. Harl Bowie for echocardiogram showing progressive valvular disease and decreased EF - 06/01/16 EF 35-40%, diffuse HK, mod-severe AS, mod gradient, severe AVA VTI likely due to decreased cardiac output in setting of systolic dysfsunction and significant mitral regurgitation, mild MR, mod-severe MR, severe LAE, mild-mod RV dilation, mild RAE, mild-mod TR, mod PASP 66mmHg. His Lasix was increased with plans to revisit further workup of valvular disease once less symptomatic. Last labs 05/28/16: BUN/Cr 19/1.14, Na 134, CBC WNL.  [facility] called because he was having issues with orthostatic hypotension - BP 132/60 to 98/58 upon standing. He was brought in the office today for a recheck. His SOB has improved. It has not fully resolved. He has mild orthopnea. His major complaint is dizziness. No LEE. No syncope. No chest pain. BP in clinic is 102/62 and HR 56.   He also reports continuing to lose weight steadily over the past few years. In 2015: 260s lbs. In 2016: 240s. Most recently: 233->229->216. He has not seen his PCP. He reports good appetite but his niece shakes her head.  Past Medical History:  Diagnosis Date  . Aortic stenosis    a. mod-sev by echo 05/2016.  . Asthma   . Cerebrovascular disease 07/2010   TIA; carotid ultrasound in 07/2010-significant bilateral plaque without focal internal carotid artery stenosis; MRI -encephalomalacia left temporal and right temporal lobes; small inferior right cerebellar infarct; small  vessel disease  . Chronic atrial fibrillation (HCC)    Paroxysmal; Echocardiogram in 2007-normal EF; mild LVH; left atrial enlargement; mild stenosis and minimal AI; negative stress nuclear study in 2008  . Chronic systolic CHF (congestive heart failure) ([place])    a. dx 05/2016 - EF 35-40%, diffuse HK, mod-severe AS, mod gradient, severe AVA VTI likely due to decreased cardiac output in setting of systolic dysfsunction and significant mitral regurgitation, mild MR, mod-severe MR, severe LAE, mild-mod RV dilation, mild RAE, mild-mod TR, mod PASP 84mmHg.  . CKD (chronic kidney disease), stage II   . Degenerative joint disease    feet and legs  . Diabetes mellitus    no insulin; A1c of 6.6 in 2005  . Dizziness    occurs daily,especially in am  . Exertional dyspnea   . Gastroesophageal reflux disease   . Hepatic steatosis   . History of noncompliance with medical treatment   . Hyperlipidemia    adverse reactions to statins and niacin  . Hypertension    Borderline  . Irregular heartbeat   . Mitral regurgitation    a. mod-sev by echo 05/2016  . Peripheral vascular disease ([place])   . Renal insufficiency   . Temporal arteritis ([place])   . Tricuspid regurgitation    a. mild-mod TR by echo 05/2016    Past Surgical History:  Procedure Laterality Date  . COLONOSCOPY  2002  . COLONOSCOPY  01/19/2012   Procedure: COLONOSCOPY;  Surgeon: Rogene Houston, MD;  Location: [facility];  Service: Endoscopy;  Laterality: N/A;  1030  . LIPOMA EXCISION  1980  .  ORIF ANKLE FRACTURE  2000   Right  . PROSTATE SURGERY  12/2011  . ROTATOR CUFF REPAIR     Right  . TRANSURETHRAL RESECTION OF PROSTATE  09/2011  . URETHRAL STRICTURE DILATATION  1980s    Current Medications: Outpatient Medications Prior to Visit  Medication Sig Dispense Refill  . albuterol (PROVENTIL HFA;VENTOLIN HFA) 108 (90 Base) MCG/ACT inhaler Inhale 1-2 puffs into the lungs 5 (five) times daily as needed for wheezing or shortness of  breath. 1 Inhaler 0  . furosemide (LASIX) 40 MG tablet Take 1 tablet (40 mg total) by mouth daily. 90 tablet 3  . HYDROcodone-acetaminophen (NORCO/VICODIN) 5-325 MG tablet Take 1 tablet by mouth 3 (three) times daily as needed for moderate pain. 90 tablet 0  . lisinopril (PRINIVIL,ZESTRIL) 2.5 MG tablet TAKE (1) TABLET BY MOUTH ONCE DAILY. 30 tablet 5  . metFORMIN (GLUCOPHAGE) 500 MG tablet 1/2 tablet twice a day 30 tablet 5  . metoprolol succinate (TOPROL-XL) 25 MG 24 hr tablet Take 1 tablet (25 mg total) by mouth daily. 90 tablet 3  . nortriptyline (PAMELOR) 10 MG capsule TAKE 1 TO 2 CAPSULES AT BEDTIME FOR BURNING IN FEET. 60 capsule 5  . traZODone (DESYREL) 100 MG tablet Take 1 tablet (100 mg total) by mouth at bedtime. 30 tablet 5  . warfarin (COUMADIN) 5 MG tablet Take 1 tablet (5 mg total) by mouth daily. TAKE AS DIRECTED BY PHYSICIAN 30 tablet 5   No facility-administered medications prior to visit.      Allergies:   Ambien [zolpidem tartrate]; Cholestatin; Lipitor [atorvastatin calcium]; Ranitidine; Simvastatin; and Xanax xr Staci Acosta er]   Social History   Social History  . Marital status: Married    Spouse name: N/A  . Number of children: 1  . Years of education: N/A   Occupational History  . Retired    Social History Main Topics  . Smoking status: Former Smoker    Packs/day: 1.00    Years: 20.00    Types: Cigarettes    Start date: 07/04/1950    Quit date: 04/25/1992  . Smokeless tobacco: Current User    Types: Chew  . Alcohol use No  . Drug use: No  . Sexual activity: Not Currently   Other Topics Concern  . None   Social History Narrative  . None     Family History:  The patient's family history includes Diabetes in his father; Stroke in his mother.   ROS:   Please see the history of present illness.  All other systems are reviewed and otherwise negative.    PHYSICAL EXAM:   VS:  BP 102/62   Pulse (!) 56   Ht 6\' 1"  (1.854 m)   Wt 216 lb (98 kg)    SpO2 97%   BMI 28.50 kg/m   BMI: Body mass index is 28.5 kg/m. GEN: Well nourished, well developed elderly WM, in no acute distress  HEENT: normocephalic, atraumatic Neck: no JVD, carotid bruits, or masses Cardiac: irregularly irregular, soft SEM throughout, no edema  Respiratory:  clear to auscultation bilaterally, normal work of breathing GI: soft, nontender, nondistended, + BS MS: no deformity or atrophy  Skin: warm and dry, no rash Neuro:  Alert and Oriented x 3, Strength and sensation are intact, follows commands, hard of hearing Psych: euthymic mood, full affect  Wt Readings from Last 3 Encounters:  06/09/16 216 lb (98 kg)  06/01/16 229 lb (103.9 kg)  05/28/16 233 lb (105.7 kg)  Studies/Labs Reviewed:   EKG:  EKG was not ordered today.  Recent Labs: 05/09/2016: ALT 38 05/28/2016: B Natriuretic Peptide 294.0; BUN 19; Creatinine, Ser 1.14; Hemoglobin 13.9; Platelets 255; Potassium 3.7; Sodium 134   Lipid Panel    Component Value Date/Time   CHOL 225 (H) 04/13/2016 1010   TRIG 128 04/13/2016 1010   HDL 34 (L) 04/13/2016 1010   CHOLHDL 6.6 (H) 04/13/2016 1010   CHOLHDL 5.9 08/01/2014 1003   VLDL 49 (H) 08/01/2014 1003   LDLCALC 165 (H) 04/13/2016 1010    Additional studies/ records that were reviewed today include: Summarized above.    ASSESSMENT & PLAN:   1. Orthostatic hypotension - the patient was seen and examined by myself and Dr. Harl Bowie given his complex hx recently. Suspect he generally remains symptomatic due to his valvular disease - primarily dizziness and exertional dyspnea. See below for further discussion. Cut Lasix in half. Stop lisinopril. Continue to follow BP with home health. 2. Chronic systolic CHF (recently diagnosed) - diuresed well based on weights. SOB improved. Per Dr. Harl Bowie, patient appears better than when he saw him in clinic the other day. Recheck labs today. Med adjustment as above. 3. Valvular disease with aortic stenosis and  mitral regurgitation - he has reached the point where further eval is necessary. Per d/w MD, recommend TEE with [facility] and aortic valve study. Risks and benefits of cardiac catheterization and TEE have been discussed with the patient.  These include bleeding, infection, kidney damage, stroke, heart attack, death, esophageal perforation. The patient understands these risks and is willing to proceed. Prior records indicate the patient has had a TIA (as well as small stroke on prior imaging) thus we will ask pharmacist to help [place] with bridging the patient with Lovenox. Check pre-cath labs. Dr. Harl Bowie has requested either Dr. Angelena Form or Burt Knack do the cath to give their thoughts regarding his valvular disease. With his comorbidities and weight loss, traditional valve surgery would be more tenuous - however, with both mitral valve and aortic valve disease, TAVR may be less ideal as well. 4. Chronic atrial fib - bridge as above. Pt denies any bleeding.  5. CKD stage II - hold Lasix AM of cath. F/u BMET today.   Disposition: F/u with Dr Harl Bowie tentatively next week as scheduled. Also instructed to f/u PCP for weight loss.   Medication Adjustments/Labs and Tests Ordered: Current medicines are reviewed at length with the patient today.  Concerns regarding medicines are outlined above. Medication changes, Labs and Tests ordered today are summarized above and listed in the Patient Instructions accessible in Encounters.   Signed, [facility] in [name], [place] [address] Ph: [phone]; Fax [phone]

## 2016-06-17 NOTE — Telephone Encounter (Signed)
Called pt, no answer. Will call back on Monday.

## 2016-06-19 ENCOUNTER — Encounter (HOSPITAL_COMMUNITY): Payer: Self-pay | Admitting: Cardiology

## 2016-06-20 ENCOUNTER — Other Ambulatory Visit: Payer: Self-pay

## 2016-06-20 ENCOUNTER — Telehealth: Payer: Self-pay | Admitting: Family Medicine

## 2016-06-20 ENCOUNTER — Ambulatory Visit (INDEPENDENT_AMBULATORY_CARE_PROVIDER_SITE_OTHER): Payer: Medicare Other | Admitting: *Deleted

## 2016-06-20 ENCOUNTER — Telehealth: Payer: Self-pay

## 2016-06-20 ENCOUNTER — Telehealth: Payer: Self-pay | Admitting: Cardiology

## 2016-06-20 ENCOUNTER — Ambulatory Visit: Payer: Medicare Other | Admitting: Family Medicine

## 2016-06-20 ENCOUNTER — Other Ambulatory Visit: Payer: Self-pay | Admitting: Cardiology

## 2016-06-20 DIAGNOSIS — I4891 Unspecified atrial fibrillation: Secondary | ICD-10-CM

## 2016-06-20 DIAGNOSIS — I350 Nonrheumatic aortic (valve) stenosis: Secondary | ICD-10-CM

## 2016-06-20 DIAGNOSIS — I6523 Occlusion and stenosis of bilateral carotid arteries: Secondary | ICD-10-CM

## 2016-06-20 DIAGNOSIS — Z7901 Long term (current) use of anticoagulants: Secondary | ICD-10-CM | POA: Diagnosis not present

## 2016-06-20 LAB — POCT INR: INR: 1.5

## 2016-06-20 NOTE — Telephone Encounter (Signed)
-----   Message from Arnoldo Lenis, MD sent at 06/17/2016  3:08 PM EDT ----- Can we refer this patient to Dr Roxy Manns CT surgery to be evaluated for CAD/Aortic stenosis/Mitral regurgitation. Let him know based on his testing he will need to be considered for possible surgery for all 3 issues.  Zandra Abts MD

## 2016-06-20 NOTE — Telephone Encounter (Signed)
Good question- I have not seen anything but please remember I was gone last week. I am still going through some paperwork to see me.

## 2016-06-20 NOTE — Telephone Encounter (Signed)
Have we received the paperwork from [facility] ([employer] in [place]?  It was supposed to be mailed to our office and filled out.

## 2016-06-20 NOTE — Telephone Encounter (Signed)
Pt was confused,explained that he was going to see a heart surgeon to discuss options for his defective heart valves and no surgery is scheduled yet. He then voiced understanding

## 2016-06-20 NOTE — Telephone Encounter (Signed)
Spoke to pt and he asked me to explain the referral being made to Dr. Roxy Manns to his niece. I spoke with her and let her know, I put in referral to ct surgery with Dr. Roxy Manns.

## 2016-06-21 NOTE — Telephone Encounter (Signed)
Per Danae Chen, we have received this information.  She is saving this for his appointment this week.

## 2016-06-21 NOTE — Telephone Encounter (Signed)
Notified Duane Whitt that we have received this information.

## 2016-06-22 ENCOUNTER — Ambulatory Visit: Payer: Self-pay | Admitting: *Deleted

## 2016-06-22 ENCOUNTER — Encounter: Payer: Self-pay | Admitting: Cardiology

## 2016-06-22 ENCOUNTER — Ambulatory Visit (INDEPENDENT_AMBULATORY_CARE_PROVIDER_SITE_OTHER): Payer: Medicare Other | Admitting: *Deleted

## 2016-06-22 ENCOUNTER — Ambulatory Visit: Payer: Medicare Other

## 2016-06-22 ENCOUNTER — Ambulatory Visit (INDEPENDENT_AMBULATORY_CARE_PROVIDER_SITE_OTHER): Payer: Medicare Other | Admitting: Cardiology

## 2016-06-22 VITALS — BP 136/78 | HR 87 | Ht 73.0 in | Wt 223.0 lb

## 2016-06-22 DIAGNOSIS — I350 Nonrheumatic aortic (valve) stenosis: Secondary | ICD-10-CM

## 2016-06-22 DIAGNOSIS — I340 Nonrheumatic mitral (valve) insufficiency: Secondary | ICD-10-CM

## 2016-06-22 DIAGNOSIS — Z7901 Long term (current) use of anticoagulants: Secondary | ICD-10-CM

## 2016-06-22 DIAGNOSIS — I6523 Occlusion and stenosis of bilateral carotid arteries: Secondary | ICD-10-CM

## 2016-06-22 DIAGNOSIS — Z79899 Other long term (current) drug therapy: Secondary | ICD-10-CM | POA: Diagnosis not present

## 2016-06-22 DIAGNOSIS — I5022 Chronic systolic (congestive) heart failure: Secondary | ICD-10-CM | POA: Diagnosis not present

## 2016-06-22 DIAGNOSIS — I2510 Atherosclerotic heart disease of native coronary artery without angina pectoris: Secondary | ICD-10-CM

## 2016-06-22 DIAGNOSIS — I4891 Unspecified atrial fibrillation: Secondary | ICD-10-CM | POA: Diagnosis not present

## 2016-06-22 LAB — VAS US CAROTID
LCCADDIAS: -6 cm/s
LCCADSYS: -63 cm/s
LCCAPDIAS: 0 cm/s
LEFT ECA DIAS: 0 cm/s
LEFT VERTEBRAL DIAS: -10 cm/s
LICADDIAS: -20 cm/s
LICADSYS: -114 cm/s
LICAPDIAS: -52 cm/s
LICAPSYS: -254 cm/s
Left CCA prox sys: 86 cm/s
RCCAPSYS: 61 cm/s
RIGHT VERTEBRAL DIAS: -15 cm/s
Right CCA prox dias: 0 cm/s
Right cca dist sys: -85 cm/s

## 2016-06-22 LAB — POCT INR: INR: 2.7

## 2016-06-22 MED ORDER — METOPROLOL SUCCINATE ER 25 MG PO TB24: 12.5000 mg | ORAL_TABLET | Freq: Every day | ORAL | 3 refills | Status: DC

## 2016-06-22 MED ORDER — FUROSEMIDE 20 MG PO TABS: 40.0000 mg | ORAL_TABLET | Freq: Every day | ORAL | 3 refills | Status: DC

## 2016-06-22 NOTE — Progress Notes (Signed)
Clinical Summary [name] [name] is a 82 y.o.male seen today for follow up of the following medical problems.   1. Afib - denies any recent palpitations - on coumadin, INR followed by his pcp   2. Aortic stenosis - mild to moderate AS by echo in 2013 mean grad 18 AVA 1.16 - repeat echo 05/2016 shows LVEF 35-40%, moderate to severe AS mean grad 17, AVA VTI 0.6, dimensionless index 0.16. Probable lower gradient due to low cardiac output due to low LVEF and significant MR.  - chronic SOB unchanged  3. Mitral regurgitation - moderate to severe by TTE - TEE 06/2016 with moderate to severe MR.  - ongoing SOB that is unchanged. Denies any significant LE edema  4. Chronic systolic HF - new diagnosis, echo with LVEF 35-40%. Probably truly lower LVEF in setting of significant MR - cath 06/2016 with 80% LCX, LAD 75%, D2 75%. Reported CI 1.46.  - weights 216, down from 229 on 06/01/16. Has had some orthostatic symptoms, we have been conservative on lasix dosing.    5. Carotid stenosis -  123456 stenosis RICA, LICA 123456 - no recent neuro symptoms    Past Medical History:  Diagnosis Date  . Aortic stenosis    a. mod-sev by echo 05/2016.  . Asthma   . Cerebrovascular disease 07/2010   TIA; carotid ultrasound in 07/2010-significant bilateral plaque without focal internal carotid artery stenosis; MRI -encephalomalacia left temporal and right temporal lobes; small inferior right cerebellar infarct; small vessel disease  . Chronic atrial fibrillation (HCC)    Paroxysmal; Echocardiogram in 2007-normal EF; mild LVH; left atrial enlargement; mild stenosis and minimal AI; negative stress nuclear study in 2008  . Chronic systolic CHF (congestive heart failure) ([employer])    a. dx 05/2016 - EF 35-40%, diffuse HK, mod-severe AS, mod gradient, severe AVA VTI likely due to decreased cardiac output in setting of systolic dysfsunction and significant mitral regurgitation, mild MR, mod-severe MR, severe LAE,  mild-mod RV dilation, mild RAE, mild-mod TR, mod PASP 62mmHg.  . CKD (chronic kidney disease), stage II   . Degenerative joint disease    feet and legs  . Diabetes mellitus    no insulin; A1c of 6.6 in 2005  . Dizziness    occurs daily,especially in am  . Exertional dyspnea   . Gastroesophageal reflux disease   . Hepatic steatosis   . History of noncompliance with medical treatment   . Hyperlipidemia    adverse reactions to statins and niacin  . Hypertension    Borderline  . Irregular heartbeat   . Mitral regurgitation    a. mod-sev by echo 05/2016  . Peripheral vascular disease ([place])   . Renal insufficiency   . Temporal arteritis ([place])   . Tricuspid regurgitation    a. mild-mod TR by echo 05/2016     Allergies  Allergen Reactions  . Ambien [Zolpidem Tartrate] Other (See Comments)    Sleep walks  . Cholestatin   . Lipitor [Atorvastatin Calcium] Other (See Comments)    myalgias  . Ranitidine Other (See Comments)    Chest discomfort  . Simvastatin Other (See Comments)    Myalgias  . Xanax Xr [Alprazolam Er]     Tightness in chest     Current Outpatient Prescriptions  Medication Sig Dispense Refill  . albuterol (PROVENTIL HFA;VENTOLIN HFA) 108 (90 Base) MCG/ACT inhaler Inhale 1-2 puffs into the lungs 5 (five) times daily as needed for wheezing or shortness of breath. 1 Inhaler  0  . enoxaparin (LOVENOX) 150 MG/ML injection Inject 1 mL (150 mg total) into the skin daily. At 8:00am 10 Syringe 1  . furosemide (LASIX) 20 MG tablet Take 1 tablet (20 mg total) by mouth daily. (Patient taking differently: Take 40 mg by mouth daily. ) 90 tablet 0  . glipiZIDE (GLUCOTROL) 5 MG tablet Take 2.5 mg by mouth 2 (two) times daily.    [name] HYDROcodone-acetaminophen (NORCO/VICODIN) 5-325 MG tablet Take 1 tablet by mouth 3 (three) times daily as needed for moderate pain. 90 tablet 0  . lisinopril (PRINIVIL,ZESTRIL) 2.5 MG tablet Take 2.5 mg by mouth daily.    . metFORMIN (GLUCOPHAGE) 500 MG  tablet 1/2 tablet twice a day 30 tablet 5  . metoprolol succinate (TOPROL-XL) 25 MG 24 hr tablet Take 1 tablet (25 mg total) by mouth daily. 90 tablet 3  . nortriptyline (PAMELOR) 10 MG capsule TAKE 1 TO 2 CAPSULES AT BEDTIME FOR BURNING IN FEET. 60 capsule 5  . traZODone (DESYREL) 100 MG tablet Take 1 tablet (100 mg total) by mouth at bedtime. 30 tablet 5  . warfarin (COUMADIN) 5 MG tablet Take 1 tablet (5 mg total) by mouth daily. TAKE AS DIRECTED BY PHYSICIAN 30 tablet 5   Current Facility-Administered Medications  Medication Dose Route Frequency Provider Last Rate Last Dose  . insulin aspart (novoLOG) injection 0-9 Units  0-9 Units Subcutaneous Q4H Charlie Pitter, PA-C         Past Surgical History:  Procedure Laterality Date  . COLONOSCOPY  2002  . COLONOSCOPY  01/19/2012   Procedure: COLONOSCOPY;  Surgeon: Rogene Houston, MD;  Location: [facility];  Service: Endoscopy;  Laterality: N/A;  1030  . LIPOMA EXCISION  1980  . ORIF ANKLE FRACTURE  2000   Right  . PROSTATE SURGERY  12/2011  . ROTATOR CUFF REPAIR     Right  . TEE WITHOUT CARDIOVERSION N/A 06/17/2016   Procedure: TRANSESOPHAGEAL ECHOCARDIOGRAM (TEE);  Surgeon: Jerline Pain, MD;  Location: [facility];  Service: Cardiovascular;  Laterality: N/A;  . TRANSURETHRAL RESECTION OF PROSTATE  09/2011  . URETHRAL STRICTURE DILATATION  1980s     Allergies  Allergen Reactions  . Ambien [Zolpidem Tartrate] Other (See Comments)    Sleep walks  . Cholestatin   . Lipitor [Atorvastatin Calcium] Other (See Comments)    myalgias  . Ranitidine Other (See Comments)    Chest discomfort  . Simvastatin Other (See Comments)    Myalgias  . Xanax Xr [Alprazolam Er]     Tightness in chest      Family History  Problem Relation Age of Onset  . Stroke Mother   . Diabetes Father   . Colon cancer Neg Hx      Social History [name] [name] reports that he quit smoking about 24 years ago. His smoking use included Cigarettes. He started  smoking about 66 years ago. He has a 20.00 pack-year smoking history. His smokeless tobacco use includes Chew. [name] [name] reports that he does not drink alcohol.   Review of Systems CONSTITUTIONAL: No weight loss, fever, chills, weakness or fatigue.  HEENT: Eyes: No visual loss, blurred vision, double vision or yellow sclerae.No hearing loss, sneezing, congestion, runny nose or sore throat.  SKIN: No rash or itching.  CARDIOVASCULAR: per HPI RESPIRATORY: per HPI GASTROINTESTINAL: No anorexia, nausea, vomiting or diarrhea. No abdominal pain or blood.  GENITOURINARY: No burning on urination, no polyuria NEUROLOGICAL: No headache, dizziness, syncope, paralysis, ataxia, numbness or tingling  in the extremities. No change in bowel or bladder control.  MUSCULOSKELETAL: No muscle, back pain, joint pain or stiffness.  LYMPHATICS: No enlarged nodes. No history of splenectomy.  PSYCHIATRIC: No history of depression or anxiety.  ENDOCRINOLOGIC: No reports of sweating, cold or heat intolerance. No polyuria or polydipsia.  [name]   Physical Examination Vitals:   06/22/16 1310  BP: 136/78  Pulse: 87   Vitals:   06/22/16 1310  Weight: 223 lb (101.2 kg)  Height: 6\' 1"  (1.854 m)    Gen: resting comfortably, no acute distress HEENT: no scleral icterus, pupils equal round and reactive, no palptable cervical adenopathy,  CV: irreg, 3/6 systolic murmur RUSB, 2/6 systolic murmur apex, no jvd Resp: Clear to auscultation bilaterally GI: abdomen is soft, non-tender, non-distended, normal bowel sounds, no hepatosplenomegaly MSK: extremities are warm, no edema.  Skin: warm, no rash Neuro:  no focal deficits Psych: appropriate affect   Diagnostic Studies 04/2012 Nuclear stress test IMPRESSION: Negative pharmacologic stress nuclear myocardial study revealing no stress induced EKG abnormalities, normal left ventricular size and normal left ventricular systolic function. By scintigraphic imaging, there  was a minimal area at the base of the inferior wall with apparently abnormal perfusion, likely the result of attenuation artifact.   03/2012 echo Study Conclusions  - Left ventricle: The cavity size was normal. There was mild focal basal hypertrophy of the septum. Systolic function was low normal. The estimated ejection fraction was in the range of 50% to 55%. Although no diagnostic regional wall motion abnormality was identified, this possibility cannot be completely excluded on the basis of this study - cannot exclude inferior hypokinesis. The study is not technically sufficient to allow evaluation of LV diastolic function. - Aortic valve: Mildly calcified annulus. Trileaflet; moderately calcified leaflets. Cusp separation was reduced. There was mild stenosis. Trivial regurgitation. Mean gradient: 54mm Hg (S). Valve area: 1.16cm^2 (Vmax). - Mitral valve: Calcified annulus. Mildly thickened leaflets . Mild regurgitation. - Left atrium: The atrium was moderately dilated. - Atrial septum: There was increased thickness of the septum, consistent with lipomatous hypertrophy. - Tricuspid valve: Trivial regurgitation. - Pericardium, extracardiac: There was no pericardial effusion.   06/2016 Cath  Ost Cx to Prox Cx lesion, 50 %stenosed.  Mid Cx to Dist Cx lesion, 80 %stenosed.  Mid RCA lesion, 40 %stenosed.  Mid LAD lesion, 75 %stenosed.  2nd Diag lesion, 75 %stenosed.  Hemodynamic findings consistent with moderate pulmonary hypertension.  Unable to cross the aortic valve.  PA sat 52%. CO 3.2 L/min.   Continue with plans for CT surgery evaluation, for valve disease and two vessel CAD.   06/2016 TEE Study Conclusions  - Left ventricle: Systolic function was moderately to severely   reduced. The estimated ejection fraction was in the range of 30%   to 35%. Diffuse hypokinesis. - Aortic valve: Cusp separation was severely reduced with fusion  of   left and non coronary cusps. There was severe stenosis. There was   mild regurgitation. Dimensionless index (VTI) is 0.2 (<0.25 is   severe). Peak and mean velocity and gradients are underestimated   by decreased EF/ CO. Valve area (VTI): 0.7 cm^2. Valve area   (Vmax): 0.78 cm^2. Valve area (Vmean): 0.75 cm^2. Planemtry is   0.84 cm squared. - Mitral valve: There was moderate to severe regurgitation directed   eccentrically and posteriorly. There is pulmonary flow systolic   blunting noted. - Left atrium: The atrium was dilated. No evidence of thrombus in   the atrial cavity  or appendage. No evidence of thrombus in the   appendage. There was spontaneous echo contrast (&quot;smoke&quot;). - Right ventricle: Systolic function was mildly reduced. - Atrial septum: No defect or patent foramen ovale was identified. - Superior vena cava: The study excluded a thrombus.  Assessment and Plan  1. Afib - no current symptoms - CHADS2Vasc score of 5, continue coumadin  2. Aortic stenosis - severe by area and dimensionless index, mild to moderate based on gradients. Discrepancy is in the setting of decreased LVEF and significant MR with decreased overall cardiac output - will need further evaluation for possible AVR in setting of low LVEF and coexisting CAD. Refer to CT surgery  3. Mitral regurgitation - moderate to severe by TEE - this also will need to be evaluated for MVR, refer to CT surgery  4. Chronic systolic HF - new diagnosis, LVEF 35-40%. Potential etilogies included valvular heart diease, tachycardia medicated CM, and ICM. - fairly low CI on recent RHC, we will decreaes his Toprol XL to 12.5mg  daily. Refer to CHF clinic, if considered for CABG/AVR/MVR he likely would need medical optimziation prior. - elevated PCWP on RHC, stable weights since last visit. We will increase lasix to 40mg  daily - check BMET/Mg in 1 week.   5. CAD - noted on recent cath, in setting of systolic  dysfunction will need consideration for CABG. Refer to CT surgery  6. Carotid stensosis - severe RICA stenosis 80-99%, we will refer to vascular. Will need consultation between vascular and CT surgery regarding his severe stenosis with potential need for CABG on bypass.       Arnoldo Lenis, M.D.

## 2016-06-22 NOTE — Patient Instructions (Signed)
Medication Instructions:  INCREASE LASIX TO 40 MG DAILY DECREASE TOPROL XL TO 12.5 MG DAILY  Labwork: Your physician recommends that you return for lab work in: [place]    Testing/Procedures: NONE  Follow-Up: Your physician recommends that you schedule a follow-up appointment in: [place] [facility]    Any Other Special Instructions Will Be Listed Below (If Applicable). * I HAVE PLACED A REFERRAL FOR THE HEART FAILURE CLINIC * I HAVE PLACED A REFERRAL TO VASCULAR SURGERY  * I HAVE PLACED A REFERRAL WITH CT SURGERY  SOMEONE FROM THOSE OFFICES WILL CALL YOU TO SET UP THOSE APPOINTMENTS    If you need a refill on your cardiac medications before your next appointment, please call your pharmacy.

## 2016-06-23 ENCOUNTER — Ambulatory Visit (INDEPENDENT_AMBULATORY_CARE_PROVIDER_SITE_OTHER): Payer: Medicare Other | Admitting: Family Medicine

## 2016-06-23 ENCOUNTER — Encounter: Payer: Self-pay | Admitting: Family Medicine

## 2016-06-23 VITALS — BP 100/70 | Ht 73.0 in | Wt 224.4 lb

## 2016-06-23 DIAGNOSIS — I6523 Occlusion and stenosis of bilateral carotid arteries: Secondary | ICD-10-CM | POA: Diagnosis not present

## 2016-06-23 DIAGNOSIS — E0842 Diabetes mellitus due to underlying condition with diabetic polyneuropathy: Secondary | ICD-10-CM | POA: Diagnosis not present

## 2016-06-23 DIAGNOSIS — I4891 Unspecified atrial fibrillation: Secondary | ICD-10-CM

## 2016-06-23 DIAGNOSIS — I350 Nonrheumatic aortic (valve) stenosis: Secondary | ICD-10-CM | POA: Diagnosis not present

## 2016-06-23 NOTE — Progress Notes (Signed)
   Subjective:    Patient ID: [name] [name], male    DOB: Jun 22, 1932, 82 y.o.   MRN: [id number]  HPI Patient is here today for a follow up visit on his many health issues. Patient had INR done yesterday and the result was 2.7. Next INR in 2 weeks.  Long discussion held with patient regarding his current condition plus also regarding his significant health findings on a recent coronary catheterization. Patient will be seeing cardiothoracic surgeon a vascular surgeon and cardiologist to help determine if surgery would be appropriate greater and 25 minutes was spent with the patient discussing these multiple issues Review of Systems He relates diabetic neuropathy some burning   see above relates shortness of breath fatigue tiredness low appetite Objective:   Physical Exam Patient is having significant dyspnea even at rest. He has lost significant amount of weight. Lungs are clear hearts irregular  extremities moderate edema, no crackles in the lungs, patient does have murmur.       Assessment & Plan:  Severe aortic stenosis along with coronary artery disease and mitral regurgitation-Very guarded prognosis. Patient is seeing cardiothoracic surgeon coming up. They will determine whether or not they feel heart surgery would be helpful and what his chances of survival would be. Patient is still undecided he is hopeful that they can help him currently this patient is so severely debilitated that he needs 24 hour assistance with bathing meal preparation and medications. The patient has a policy that would help get him a at home. If it wasn't for this policy he would be in a nursing home.

## 2016-06-24 ENCOUNTER — Telehealth: Payer: Self-pay | Admitting: Family Medicine

## 2016-06-24 DIAGNOSIS — I481 Persistent atrial fibrillation: Secondary | ICD-10-CM | POA: Diagnosis not present

## 2016-06-24 DIAGNOSIS — I10 Essential (primary) hypertension: Secondary | ICD-10-CM | POA: Diagnosis not present

## 2016-06-24 DIAGNOSIS — E119 Type 2 diabetes mellitus without complications: Secondary | ICD-10-CM | POA: Diagnosis not present

## 2016-06-24 DIAGNOSIS — R270 Ataxia, unspecified: Secondary | ICD-10-CM | POA: Diagnosis not present

## 2016-06-24 DIAGNOSIS — E785 Hyperlipidemia, unspecified: Secondary | ICD-10-CM | POA: Diagnosis not present

## 2016-06-24 NOTE — Telephone Encounter (Signed)
Advised patient per Dr. Nicki Reaper to go to the ER asap if he is having difficulty breathing. Patient states that he doesn't feel that it is bad enough to go to the ER and his home health nurse will be at his home shortly and he will have her assess him when she gets there.

## 2016-06-24 NOTE — Telephone Encounter (Signed)
Patient is having trouble breathing.  He wants to know if the breathing machine can be hooked up to him for 20 or so minutes today?

## 2016-06-28 ENCOUNTER — Encounter (HOSPITAL_COMMUNITY): Payer: Self-pay | Admitting: Emergency Medicine

## 2016-06-28 ENCOUNTER — Telehealth: Payer: Self-pay

## 2016-06-28 ENCOUNTER — Emergency Department (HOSPITAL_COMMUNITY): Payer: Medicare Other

## 2016-06-28 ENCOUNTER — Inpatient Hospital Stay (HOSPITAL_COMMUNITY)
Admission: EM | Admit: 2016-06-28 | Discharge: 2016-07-23 | DRG: 266 | Disposition: A | Discharge status: Skilled Nursing Facility | Payer: Medicare Other | Attending: Thoracic Surgery (Cardiothoracic Vascular Surgery) | Admitting: Thoracic Surgery (Cardiothoracic Vascular Surgery)

## 2016-06-28 DIAGNOSIS — R319 Hematuria, unspecified: Secondary | ICD-10-CM | POA: Diagnosis not present

## 2016-06-28 DIAGNOSIS — E875 Hyperkalemia: Secondary | ICD-10-CM | POA: Diagnosis not present

## 2016-06-28 DIAGNOSIS — Z952 Presence of prosthetic heart valve: Secondary | ICD-10-CM

## 2016-06-28 DIAGNOSIS — N182 Chronic kidney disease, stage 2 (mild): Secondary | ICD-10-CM | POA: Diagnosis present

## 2016-06-28 DIAGNOSIS — N281 Cyst of kidney, acquired: Secondary | ICD-10-CM | POA: Diagnosis present

## 2016-06-28 DIAGNOSIS — I472 Ventricular tachycardia, unspecified: Secondary | ICD-10-CM

## 2016-06-28 DIAGNOSIS — D696 Thrombocytopenia, unspecified: Secondary | ICD-10-CM | POA: Diagnosis not present

## 2016-06-28 DIAGNOSIS — E876 Hypokalemia: Secondary | ICD-10-CM | POA: Diagnosis present

## 2016-06-28 DIAGNOSIS — I25119 Atherosclerotic heart disease of native coronary artery with unspecified angina pectoris: Secondary | ICD-10-CM | POA: Diagnosis present

## 2016-06-28 DIAGNOSIS — I739 Peripheral vascular disease, unspecified: Secondary | ICD-10-CM | POA: Diagnosis not present

## 2016-06-28 DIAGNOSIS — E1142 Type 2 diabetes mellitus with diabetic polyneuropathy: Secondary | ICD-10-CM | POA: Diagnosis present

## 2016-06-28 DIAGNOSIS — I5042 Chronic combined systolic (congestive) and diastolic (congestive) heart failure: Secondary | ICD-10-CM | POA: Diagnosis not present

## 2016-06-28 DIAGNOSIS — I4891 Unspecified atrial fibrillation: Secondary | ICD-10-CM | POA: Diagnosis not present

## 2016-06-28 DIAGNOSIS — K760 Fatty (change of) liver, not elsewhere classified: Secondary | ICD-10-CM | POA: Diagnosis present

## 2016-06-28 DIAGNOSIS — I482 Chronic atrial fibrillation: Secondary | ICD-10-CM | POA: Diagnosis not present

## 2016-06-28 DIAGNOSIS — I481 Persistent atrial fibrillation: Secondary | ICD-10-CM | POA: Diagnosis not present

## 2016-06-28 DIAGNOSIS — E1129 Type 2 diabetes mellitus with other diabetic kidney complication: Secondary | ICD-10-CM

## 2016-06-28 DIAGNOSIS — I272 Other secondary pulmonary hypertension: Secondary | ICD-10-CM | POA: Diagnosis present

## 2016-06-28 DIAGNOSIS — J45909 Unspecified asthma, uncomplicated: Secondary | ICD-10-CM | POA: Diagnosis present

## 2016-06-28 DIAGNOSIS — K219 Gastro-esophageal reflux disease without esophagitis: Secondary | ICD-10-CM | POA: Diagnosis present

## 2016-06-28 DIAGNOSIS — E785 Hyperlipidemia, unspecified: Secondary | ICD-10-CM | POA: Diagnosis present

## 2016-06-28 DIAGNOSIS — D638 Anemia in other chronic diseases classified elsewhere: Secondary | ICD-10-CM | POA: Diagnosis present

## 2016-06-28 DIAGNOSIS — I350 Nonrheumatic aortic (valve) stenosis: Secondary | ICD-10-CM

## 2016-06-28 DIAGNOSIS — I6529 Occlusion and stenosis of unspecified carotid artery: Secondary | ICD-10-CM | POA: Diagnosis not present

## 2016-06-28 DIAGNOSIS — Z7984 Long term (current) use of oral hypoglycemic drugs: Secondary | ICD-10-CM

## 2016-06-28 DIAGNOSIS — I083 Combined rheumatic disorders of mitral, aortic and tricuspid valves: Secondary | ICD-10-CM | POA: Diagnosis not present

## 2016-06-28 DIAGNOSIS — J9811 Atelectasis: Secondary | ICD-10-CM | POA: Diagnosis not present

## 2016-06-28 DIAGNOSIS — Z8673 Personal history of transient ischemic attack (TIA), and cerebral infarction without residual deficits: Secondary | ICD-10-CM

## 2016-06-28 DIAGNOSIS — R0600 Dyspnea, unspecified: Secondary | ICD-10-CM | POA: Diagnosis present

## 2016-06-28 DIAGNOSIS — I10 Essential (primary) hypertension: Secondary | ICD-10-CM | POA: Diagnosis not present

## 2016-06-28 DIAGNOSIS — N390 Urinary tract infection, site not specified: Secondary | ICD-10-CM | POA: Diagnosis not present

## 2016-06-28 DIAGNOSIS — I6523 Occlusion and stenosis of bilateral carotid arteries: Secondary | ICD-10-CM | POA: Diagnosis present

## 2016-06-28 DIAGNOSIS — I5020 Unspecified systolic (congestive) heart failure: Secondary | ICD-10-CM

## 2016-06-28 DIAGNOSIS — I679 Cerebrovascular disease, unspecified: Secondary | ICD-10-CM | POA: Diagnosis present

## 2016-06-28 DIAGNOSIS — E1122 Type 2 diabetes mellitus with diabetic chronic kidney disease: Secondary | ICD-10-CM | POA: Diagnosis not present

## 2016-06-28 DIAGNOSIS — E1151 Type 2 diabetes mellitus with diabetic peripheral angiopathy without gangrene: Secondary | ICD-10-CM | POA: Diagnosis present

## 2016-06-28 DIAGNOSIS — Z006 Encounter for examination for normal comparison and control in clinical research program: Secondary | ICD-10-CM | POA: Diagnosis not present

## 2016-06-28 DIAGNOSIS — Z87891 Personal history of nicotine dependence: Secondary | ICD-10-CM

## 2016-06-28 DIAGNOSIS — Z48812 Encounter for surgical aftercare following surgery on the circulatory system: Secondary | ICD-10-CM | POA: Diagnosis not present

## 2016-06-28 DIAGNOSIS — I5043 Acute on chronic combined systolic (congestive) and diastolic (congestive) heart failure: Secondary | ICD-10-CM | POA: Diagnosis present

## 2016-06-28 DIAGNOSIS — M316 Other giant cell arteritis: Secondary | ICD-10-CM | POA: Diagnosis present

## 2016-06-28 DIAGNOSIS — J9601 Acute respiratory failure with hypoxia: Secondary | ICD-10-CM | POA: Diagnosis present

## 2016-06-28 DIAGNOSIS — E119 Type 2 diabetes mellitus without complications: Secondary | ICD-10-CM | POA: Diagnosis not present

## 2016-06-28 DIAGNOSIS — N401 Enlarged prostate with lower urinary tract symptoms: Secondary | ICD-10-CM | POA: Diagnosis not present

## 2016-06-28 DIAGNOSIS — K5900 Constipation, unspecified: Secondary | ICD-10-CM | POA: Diagnosis not present

## 2016-06-28 DIAGNOSIS — I5023 Acute on chronic systolic (congestive) heart failure: Secondary | ICD-10-CM | POA: Diagnosis not present

## 2016-06-28 DIAGNOSIS — I776 Arteritis, unspecified: Secondary | ICD-10-CM | POA: Diagnosis not present

## 2016-06-28 DIAGNOSIS — R338 Other retention of urine: Secondary | ICD-10-CM | POA: Diagnosis not present

## 2016-06-28 DIAGNOSIS — M6281 Muscle weakness (generalized): Secondary | ICD-10-CM | POA: Diagnosis not present

## 2016-06-28 DIAGNOSIS — R570 Cardiogenic shock: Secondary | ICD-10-CM | POA: Diagnosis not present

## 2016-06-28 DIAGNOSIS — R310 Gross hematuria: Secondary | ICD-10-CM | POA: Diagnosis not present

## 2016-06-28 DIAGNOSIS — I340 Nonrheumatic mitral (valve) insufficiency: Secondary | ICD-10-CM | POA: Diagnosis present

## 2016-06-28 DIAGNOSIS — R040 Epistaxis: Secondary | ICD-10-CM | POA: Diagnosis not present

## 2016-06-28 DIAGNOSIS — I2510 Atherosclerotic heart disease of native coronary artery without angina pectoris: Secondary | ICD-10-CM | POA: Diagnosis present

## 2016-06-28 DIAGNOSIS — E118 Type 2 diabetes mellitus with unspecified complications: Secondary | ICD-10-CM | POA: Diagnosis not present

## 2016-06-28 DIAGNOSIS — R079 Chest pain, unspecified: Secondary | ICD-10-CM | POA: Diagnosis not present

## 2016-06-28 DIAGNOSIS — I509 Heart failure, unspecified: Secondary | ICD-10-CM

## 2016-06-28 DIAGNOSIS — I130 Hypertensive heart and chronic kidney disease with heart failure and stage 1 through stage 4 chronic kidney disease, or unspecified chronic kidney disease: Secondary | ICD-10-CM | POA: Diagnosis present

## 2016-06-28 DIAGNOSIS — I6521 Occlusion and stenosis of right carotid artery: Secondary | ICD-10-CM | POA: Diagnosis not present

## 2016-06-28 DIAGNOSIS — R0602 Shortness of breath: Secondary | ICD-10-CM | POA: Diagnosis present

## 2016-06-28 DIAGNOSIS — Z79899 Other long term (current) drug therapy: Secondary | ICD-10-CM

## 2016-06-28 DIAGNOSIS — I480 Paroxysmal atrial fibrillation: Secondary | ICD-10-CM | POA: Diagnosis not present

## 2016-06-28 DIAGNOSIS — D3705 Neoplasm of uncertain behavior of pharynx: Secondary | ICD-10-CM | POA: Diagnosis not present

## 2016-06-28 DIAGNOSIS — Z888 Allergy status to other drugs, medicaments and biological substances status: Secondary | ICD-10-CM

## 2016-06-28 DIAGNOSIS — G8929 Other chronic pain: Secondary | ICD-10-CM | POA: Diagnosis not present

## 2016-06-28 DIAGNOSIS — N189 Chronic kidney disease, unspecified: Secondary | ICD-10-CM | POA: Diagnosis not present

## 2016-06-28 DIAGNOSIS — Z954 Presence of other heart-valve replacement: Secondary | ICD-10-CM | POA: Diagnosis not present

## 2016-06-28 DIAGNOSIS — T45525A Adverse effect of antithrombotic drugs, initial encounter: Secondary | ICD-10-CM | POA: Diagnosis not present

## 2016-06-28 DIAGNOSIS — Z452 Encounter for adjustment and management of vascular access device: Secondary | ICD-10-CM | POA: Diagnosis not present

## 2016-06-28 DIAGNOSIS — I110 Hypertensive heart disease with heart failure: Secondary | ICD-10-CM | POA: Diagnosis not present

## 2016-06-28 DIAGNOSIS — Z7901 Long term (current) use of anticoagulants: Secondary | ICD-10-CM

## 2016-06-28 DIAGNOSIS — N2889 Other specified disorders of kidney and ureter: Secondary | ICD-10-CM | POA: Diagnosis not present

## 2016-06-28 DIAGNOSIS — L270 Generalized skin eruption due to drugs and medicaments taken internally: Secondary | ICD-10-CM | POA: Diagnosis not present

## 2016-06-28 HISTORY — DX: Presence of prosthetic heart valve: Z95.2

## 2016-06-28 HISTORY — DX: Atherosclerotic heart disease of native coronary artery without angina pectoris: I25.10

## 2016-06-28 LAB — CBC WITH DIFFERENTIAL/PLATELET
BASOS ABS: 0.1 10*3/uL (ref 0.0–0.1)
Basophils Relative: 1 %
EOS ABS: 0.2 10*3/uL (ref 0.0–0.7)
EOS PCT: 3 %
HCT: 44.6 % (ref 39.0–52.0)
Hemoglobin: 15.2 g/dL (ref 13.0–17.0)
Lymphocytes Relative: 14 %
Lymphs Abs: 1 10*3/uL (ref 0.7–4.0)
MCH: 30.5 pg (ref 26.0–34.0)
MCHC: 34.1 g/dL (ref 30.0–36.0)
MCV: 89.6 fL (ref 78.0–100.0)
MONO ABS: 0.5 10*3/uL (ref 0.1–1.0)
Monocytes Relative: 8 %
Neutro Abs: 5.1 10*3/uL (ref 1.7–7.7)
Neutrophils Relative %: 74 %
PLATELETS: 266 10*3/uL (ref 150–400)
RBC: 4.98 MIL/uL (ref 4.22–5.81)
RDW: 15.3 % (ref 11.5–15.5)
WBC: 6.8 10*3/uL (ref 4.0–10.5)

## 2016-06-28 LAB — HEPATIC FUNCTION PANEL
ALT: 27 U/L (ref 17–63)
AST: 24 U/L (ref 15–41)
Albumin: 3.9 g/dL (ref 3.5–5.0)
Alkaline Phosphatase: 72 U/L (ref 38–126)
BILIRUBIN INDIRECT: 1.2 mg/dL — AB (ref 0.3–0.9)
Bilirubin, Direct: 0.5 mg/dL (ref 0.1–0.5)
TOTAL PROTEIN: 7.1 g/dL (ref 6.5–8.1)
Total Bilirubin: 1.7 mg/dL — ABNORMAL HIGH (ref 0.3–1.2)

## 2016-06-28 LAB — BASIC METABOLIC PANEL
ANION GAP: 10 (ref 5–15)
BUN: 19 mg/dL (ref 6–20)
CALCIUM: 9.3 mg/dL (ref 8.9–10.3)
CO2: 28 mmol/L (ref 22–32)
Chloride: 99 mmol/L — ABNORMAL LOW (ref 101–111)
Creatinine, Ser: 1.1 mg/dL (ref 0.61–1.24)
Glucose, Bld: 118 mg/dL — ABNORMAL HIGH (ref 65–99)
Potassium: 3.1 mmol/L — ABNORMAL LOW (ref 3.5–5.1)
SODIUM: 137 mmol/L (ref 135–145)

## 2016-06-28 LAB — D-DIMER, QUANTITATIVE (NOT AT ARMC)

## 2016-06-28 LAB — I-STAT TROPONIN, ED: TROPONIN I, POC: 0.03 ng/mL (ref 0.00–0.08)

## 2016-06-28 LAB — BRAIN NATRIURETIC PEPTIDE: B NATRIURETIC PEPTIDE 5: 222 pg/mL — AB (ref 0.0–100.0)

## 2016-06-28 LAB — PROTIME-INR
INR: 1.66
PROTHROMBIN TIME: 19.8 s — AB (ref 11.4–15.2)

## 2016-06-28 MED ORDER — ACETAMINOPHEN 650 MG RE SUPP: 650.0000 mg | Freq: Four times a day (QID) | RECTAL | Status: DC | PRN

## 2016-06-28 MED ORDER — METFORMIN HCL 500 MG PO TABS
250.0000 mg | ORAL_TABLET | Freq: Two times a day (BID) | ORAL | Status: DC
  Administered 2016-06-29 – 2016-06-30 (×3): 250 mg via ORAL
  Filled 2016-06-28 (×3): qty 1

## 2016-06-28 MED ORDER — ONDANSETRON HCL 4 MG PO TABS: 4.0000 mg | ORAL_TABLET | Freq: Four times a day (QID) | ORAL | Status: DC | PRN

## 2016-06-28 MED ORDER — FUROSEMIDE 10 MG/ML IJ SOLN
40.0000 mg | Freq: Once | INTRAMUSCULAR | Status: AC
  Administered 2016-06-28: 40 mg via INTRAVENOUS
  Filled 2016-06-28: qty 4

## 2016-06-28 MED ORDER — SODIUM CHLORIDE 0.9% FLUSH: 3.0000 mL | INTRAVENOUS | Status: DC | PRN

## 2016-06-28 MED ORDER — ONDANSETRON HCL 4 MG/2ML IJ SOLN: 4.0000 mg | Freq: Four times a day (QID) | INTRAMUSCULAR | Status: DC | PRN

## 2016-06-28 MED ORDER — POTASSIUM CHLORIDE CRYS ER 20 MEQ PO TBCR
40.0000 meq | EXTENDED_RELEASE_TABLET | Freq: Once | ORAL | Status: AC
  Administered 2016-06-28: 40 meq via ORAL
  Filled 2016-06-28: qty 2

## 2016-06-28 MED ORDER — WARFARIN - PHYSICIAN DOSING INPATIENT: Freq: Every day | Status: DC

## 2016-06-28 MED ORDER — SODIUM CHLORIDE 0.9% FLUSH
3.0000 mL | Freq: Two times a day (BID) | INTRAVENOUS | Status: DC
  Administered 2016-06-28 – 2016-07-18 (×22): 3 mL via INTRAVENOUS

## 2016-06-28 MED ORDER — ACETAMINOPHEN 325 MG PO TABS: 650.0000 mg | ORAL_TABLET | Freq: Four times a day (QID) | ORAL | Status: DC | PRN

## 2016-06-28 MED ORDER — INSULIN ASPART 100 UNIT/ML ~~LOC~~ SOLN
0.0000 [IU] | Freq: Three times a day (TID) | SUBCUTANEOUS | Status: DC
  Administered 2016-06-29: 1 [IU] via SUBCUTANEOUS
  Administered 2016-06-29 (×2): 2 [IU] via SUBCUTANEOUS
  Administered 2016-06-30: 1 [IU] via SUBCUTANEOUS
  Administered 2016-06-30: 3 [IU] via SUBCUTANEOUS
  Administered 2016-06-30 – 2016-07-01 (×2): 1 [IU] via SUBCUTANEOUS
  Administered 2016-07-01: 2 [IU] via SUBCUTANEOUS
  Administered 2016-07-02: 3 [IU] via SUBCUTANEOUS
  Administered 2016-07-03 – 2016-07-04 (×5): 2 [IU] via SUBCUTANEOUS
  Administered 2016-07-04 – 2016-07-05 (×2): 3 [IU] via SUBCUTANEOUS
  Administered 2016-07-05 (×2): 2 [IU] via SUBCUTANEOUS
  Administered 2016-07-06: 5 [IU] via SUBCUTANEOUS
  Administered 2016-07-06 – 2016-07-07 (×4): 3 [IU] via SUBCUTANEOUS
  Administered 2016-07-08: 5 [IU] via SUBCUTANEOUS
  Administered 2016-07-08: 3 [IU] via SUBCUTANEOUS
  Administered 2016-07-08: 2 [IU] via SUBCUTANEOUS
  Administered 2016-07-09: 5 [IU] via SUBCUTANEOUS
  Administered 2016-07-09 (×2): 2 [IU] via SUBCUTANEOUS
  Administered 2016-07-10: 3 [IU] via SUBCUTANEOUS
  Administered 2016-07-10: 2 [IU] via SUBCUTANEOUS
  Administered 2016-07-10: 3 [IU] via SUBCUTANEOUS
  Administered 2016-07-11 – 2016-07-12 (×3): 2 [IU] via SUBCUTANEOUS
  Administered 2016-07-12: 13:00:00 via SUBCUTANEOUS
  Administered 2016-07-13 (×2): 2 [IU] via SUBCUTANEOUS
  Administered 2016-07-13 – 2016-07-14 (×3): 3 [IU] via SUBCUTANEOUS
  Administered 2016-07-15: 2 [IU] via SUBCUTANEOUS
  Administered 2016-07-15 (×2): 3 [IU] via SUBCUTANEOUS
  Administered 2016-07-15 – 2016-07-16 (×3): 2 [IU] via SUBCUTANEOUS
  Administered 2016-07-16: 3 [IU] via SUBCUTANEOUS
  Administered 2016-07-17 (×3): 2 [IU] via SUBCUTANEOUS
  Administered 2016-07-18 (×2): 1 [IU] via SUBCUTANEOUS
  Administered 2016-07-18: 2 [IU] via SUBCUTANEOUS

## 2016-06-28 MED ORDER — WARFARIN SODIUM 5 MG PO TABS
5.0000 mg | ORAL_TABLET | Freq: Every day | ORAL | Status: DC
  Administered 2016-06-28: 5 mg via ORAL
  Filled 2016-06-28: qty 1

## 2016-06-28 MED ORDER — FUROSEMIDE 10 MG/ML IJ SOLN
40.0000 mg | Freq: Two times a day (BID) | INTRAMUSCULAR | Status: DC
  Administered 2016-06-29 – 2016-06-30 (×3): 40 mg via INTRAVENOUS
  Filled 2016-06-28 (×3): qty 4

## 2016-06-28 MED ORDER — NORTRIPTYLINE HCL 10 MG PO CAPS
10.0000 mg | ORAL_CAPSULE | Freq: Every evening | ORAL | Status: DC | PRN
  Administered 2016-07-04: 10 mg via ORAL
  Filled 2016-06-28 (×3): qty 1

## 2016-06-28 MED ORDER — HYDROCODONE-ACETAMINOPHEN 5-325 MG PO TABS
1.0000 | ORAL_TABLET | Freq: Three times a day (TID) | ORAL | Status: DC | PRN
  Administered 2016-07-07 – 2016-07-14 (×5): 1 via ORAL
  Filled 2016-06-28 (×5): qty 1

## 2016-06-28 MED ORDER — TRAZODONE HCL 50 MG PO TABS
100.0000 mg | ORAL_TABLET | Freq: Every day | ORAL | Status: DC
  Administered 2016-06-28 – 2016-07-18 (×21): 100 mg via ORAL
  Filled 2016-06-28 (×20): qty 2
  Filled 2016-06-28: qty 1

## 2016-06-28 MED ORDER — BISACODYL 10 MG RE SUPP
10.0000 mg | Freq: Every day | RECTAL | Status: DC | PRN
  Administered 2016-06-30: 10 mg via RECTAL
  Filled 2016-06-28 (×2): qty 1

## 2016-06-28 MED ORDER — SODIUM CHLORIDE 0.9 % IV SOLN
250.0000 mL | INTRAVENOUS | Status: DC | PRN
  Administered 2016-07-11: 250 mL via INTRAVENOUS

## 2016-06-28 NOTE — ED Notes (Signed)
Attempted to call report. RN does not answer phone.

## 2016-06-28 NOTE — Consult Note (Signed)
Primary cardiologist: Consulting cardiologist:  Clinical Summary [name] [name] is a 82 y.o.male history of chronic systolic HF LVEF 123456, recently diagosed 2 vessel CAD by cath, moderate to severe AS in the setting of low LVEF, moderate to severe MR, severe carotid stenosis presents with increasing SOB. After our last clinic visit he was referred to CT surgery to evaluate for possible CABG with possible AVR/MVR, referred to CHF clinic, and vascular clinic as well given his recent diagnosis of severe carotid stenosis. He had a low CI on recent RHC, I lowered his Toprol to 12.5mg  last visit and increased his lasix to 40mg  daily. Weight is down from 223 to 216 lbs since that visit. He is down around 15 lbs since I started seeing him last month. Despite diuresis his symptoms or SOB/DOE and orthopnea have continued to progress. He presents to the Er today after severe orthopnea last light kept him from laying flat at all. DOE at home walking just a few feet.    K 3.1, Cr 1.10, Hgb 15.2, Plt 266, BNP 222 (down from 294), trop neg CXR small bilateral effusions EKG: afib   Allergies  Allergen Reactions  . Ambien [Zolpidem Tartrate] Other (See Comments)    Sleep walks  . Cholestatin   . Lipitor [Atorvastatin Calcium] Other (See Comments)    myalgias  . Ranitidine Other (See Comments)    Chest discomfort  . Simvastatin Other (See Comments)    Myalgias  . Xanax Xr [Alprazolam Er]     Tightness in chest    Medications Scheduled Medications:     Infusions:     PRN Medications:     Past Medical History:  Diagnosis Date  . Aortic stenosis    a. mod-sev by echo 05/2016.  . Asthma   . Cerebrovascular disease 07/2010   TIA; carotid ultrasound in 07/2010-significant bilateral plaque without focal internal carotid artery stenosis; MRI -encephalomalacia left temporal and right temporal lobes; small inferior right cerebellar infarct; small vessel disease  . Chronic atrial fibrillation  (HCC)    Paroxysmal; Echocardiogram in 2007-normal EF; mild LVH; left atrial enlargement; mild stenosis and minimal AI; negative stress nuclear study in 2008  . Chronic systolic CHF (congestive heart failure) ([place])    a. dx 05/2016 - EF 35-40%, diffuse HK, mod-severe AS, mod gradient, severe AVA VTI likely due to decreased cardiac output in setting of systolic dysfsunction and significant mitral regurgitation, mild MR, mod-severe MR, severe LAE, mild-mod RV dilation, mild RAE, mild-mod TR, mod PASP 24mmHg.  . CKD (chronic kidney disease), stage II   . Degenerative joint disease    feet and legs  . Diabetes mellitus    no insulin; A1c of 6.6 in 2005  . Dizziness    occurs daily,especially in am  . Exertional dyspnea   . Gastroesophageal reflux disease   . Hepatic steatosis   . History of noncompliance with medical treatment   . Hyperlipidemia    adverse reactions to statins and niacin  . Hypertension    Borderline  . Irregular heartbeat   . Mitral regurgitation    a. mod-sev by echo 05/2016  . Peripheral vascular disease ([place])   . Renal insufficiency   . Temporal arteritis ([place])   . Tricuspid regurgitation    a. mild-mod TR by echo 05/2016    Past Surgical History:  Procedure Laterality Date  . COLONOSCOPY  2002  . COLONOSCOPY  01/19/2012   Procedure: COLONOSCOPY;  Surgeon: Rogene [place], MD;  [facility];  Service: Endoscopy;  Laterality: N/A;  1030  . LIPOMA EXCISION  1980  . ORIF ANKLE FRACTURE  2000   Right  . PROSTATE SURGERY  12/2011  . ROTATOR CUFF REPAIR     Right  . TEE WITHOUT CARDIOVERSION N/A 06/17/2016   Procedure: TRANSESOPHAGEAL ECHOCARDIOGRAM (TEE);  Surgeon: Jerline Pain, MD;  Location: [facility];  Service: Cardiovascular;  Laterality: N/A;  . TRANSURETHRAL RESECTION OF PROSTATE  09/2011  . URETHRAL STRICTURE DILATATION  1980s    Family History  Problem Relation Age of Onset  . Stroke Mother   . Diabetes Father   . Colon cancer Neg Hx      Social History [name] [name] reports that he quit smoking about 24 years ago. His smoking use included Cigarettes. He started smoking about 66 years ago. He has a 20.00 pack-year smoking history. His smokeless tobacco use includes Chew. [name] [name] reports that he does not drink alcohol.  Review of Systems CONSTITUTIONAL: No weight loss, fever, chills, weakness or fatigue.  HEENT: Eyes: No visual loss, blurred vision, double vision or yellow sclerae. No hearing loss, sneezing, congestion, runny nose or sore throat.  SKIN: No rash or itching.  CARDIOVASCULAR: per HPI RESPIRATORY: per HPI.  GASTROINTESTINAL: No anorexia, nausea, vomiting or diarrhea. No abdominal pain or blood.  GENITOURINARY: no polyuria, no dysuria NEUROLOGICAL: No headache, dizziness, syncope, paralysis, ataxia, numbness or tingling in the extremities. No change in bowel or bladder control.  MUSCULOSKELETAL: No muscle, back pain, joint pain or stiffness.  HEMATOLOGIC: No anemia, bleeding or bruising.  LYMPHATICS: No enlarged nodes. No history of splenectomy.  PSYCHIATRIC: No history of depression or anxiety.      Physical Examination Blood pressure 101/78, pulse (!) 55, resp. rate (!) 32, height 6' (1.829 m), weight 216 lb (98 kg), SpO2 98 %. No intake or output data in the 24 hours ending 06/28/16 1604  HEENT: sclera clear, throat clear  Cardiovascular: irreg, 3/6 systolic murmur RUSB, 3/6 systolic murmur apex  Respiratory: crackles bilateral bases  GI: abdomen soft, NT, ND  MSK: trace biltaeral LE edema  Neuro: no focal deficits  Psych: appropriate affect   Lab Results  Basic Metabolic Panel:  Recent Labs Lab 06/28/16 1316  NA 137  K 3.1*  CL 99*  CO2 28  GLUCOSE 118*  BUN 19  CREATININE 1.10  CALCIUM 9.3    Liver Function Tests:  Recent Labs Lab 06/28/16 1316  AST 24  ALT 27  ALKPHOS 72  BILITOT 1.7*  PROT 7.1  ALBUMIN 3.9    CBC:  Recent Labs Lab 06/28/16 1316  WBC 6.8   NEUTROABS 5.1  HGB 15.2  HCT 44.6  MCV 89.6  PLT 266    Cardiac Enzymes: No results for input(s): CKTOTAL, CKMB, CKMBINDEX, TROPONINI in the last 168 hours.  BNP: Invalid input(s): POCBNP   ECG   Imaging  06/2016 RHC/LHC  Ost Cx to Prox Cx lesion, 50 %stenosed.  Mid Cx to Dist Cx lesion, 80 %stenosed.  Mid RCA lesion, 40 %stenosed.  Mid LAD lesion, 75 %stenosed.  2nd Diag lesion, 75 %stenosed.  Hemodynamic findings consistent with moderate pulmonary hypertension.  Unable to cross the aortic valve.  PA sat 52%. CO 3.2 L/min.   Continue with plans for CT surgery evaluation, for valve disease and two vessel CAD.   06/2016 TEE Study Conclusions  - Left ventricle: Systolic function was moderately to severely   reduced. The estimated ejection fraction was in  the range of 30%   to 35%. Diffuse hypokinesis. - Aortic valve: Cusp separation was severely reduced with fusion of   left and non coronary cusps. There was severe stenosis. There was   mild regurgitation. Dimensionless index (VTI) is 0.2 (<0.25 is   severe). Peak and mean velocity and gradients are underestimated   by decreased EF/ CO. Valve area (VTI): 0.7 cm^2. Valve area   (Vmax): 0.78 cm^2. Valve area (Vmean): 0.75 cm^2. Planemtry is   0.84 cm squared. - Mitral valve: There was moderate to severe regurgitation directed   eccentrically and posteriorly. There is pulmonary flow systolic   blunting noted. - Left atrium: The atrium was dilated. No evidence of thrombus in   the atrial cavity or appendage. No evidence of thrombus in the   appendage. There was spontaneous echo contrast (&quot;smoke&quot;). - Right ventricle: Systolic function was mildly reduced. - Atrial septum: No defect or patent foramen ovale was identified. - Superior vena cava: The study excluded a thrombus.   05/2016 echo Study Conclusions  - Left ventricle: The cavity size was normal. Wall thickness was   normal. Systolic  function was moderately reduced. The estimated   ejection fraction was in the range of 35% to 40%. Diffuse   hypokinesis. - Aortic valve: Mildly to moderately calcified annulus. Moderately   thickened leaflets. There was moderate to severe stenosis.   Moderate gradient, severe AVA VTI likely due to decreased cardiac   output in setting of systolic dysfunction and significant mitral   regurgitation. There was mild regurgitation. VTI ratio of LVOT to   aortic valve: 0.16. Valve area (VTI): 0.6 cm^2. Valve area   (Vmax): 0.77 cm^2. Valve area (Vmean): 0.73 cm^2. - Mitral valve: Mildly to moderately calcified annulus. Moderately   thickened leaflets . There was moderate to severe regurgitation.   Valve area by continuity equation (using LVOT flow): 1.5 cm^2. - Left atrium: The atrium was severely dilated. - Right ventricle: Systolic function was mildly to moderately   reduced. - Right atrium: The atrium was mildly dilated. - Atrial septum: No defect or patent foramen ovale was identified. - Tricuspid valve: There was mild-moderate regurgitation. - Pulmonary arteries: Systolic pressure was moderately increased.   PA peak pressure: 54 mm Hg (S).     Impression/Recommendations 1. Acute on chronic systolic HF - new diagnosis last month. LVEF 35-40%, with his MR LVEF likely truly even lower. Etiology likely mixed with his 2 vessel CAD and moderate to severe AS and moderate to severe MR - despite significant diuresis symptoms have aggressively progressed over the last few weeks, class IV CHF.  - low CI during RHC noted - we will admit him for diuresis, will likely consider transfer to [facility] over the next few days to be evaluated by CHF service and CT surgery given his rapid progression of symptoms - will start lasix IV 40mg  bid tonight.   2. CAD - 2 vessel CAD by recent cath. He was to see CT surgery as outpatient to consider possible CABG along with AVR/MVR - if transferred will need  inpatient evaluation  3. Vavluar heart disease - moderate to severe AS in setting of low LVEF, moderate to severe MR - will need consideration for valve replacement if surgical candidtate  4. Afib - continue current meds  5. Carotid stenosis - severe carotid stenosis RICA 80-99%, he was to see vascular surgery at outpatient - will need to be considered if he is a possible CABG/valve surgery candidate  6. Afib - continue current meds. CHADS2Vasc score of 6, continue coumadin.    Carlyle Dolly, M.D.

## 2016-06-28 NOTE — Telephone Encounter (Signed)
Pt called stating he was having trouble breathing, nausea, fatigue and heaviness in  his chest. He complains of this for 2 days now but today his symptoms are a lot worse than usual as he has not been able to sleep because he cannot catch his breath.  He stated his wife was at home with him but she isn't well. I advised him to go to the ED to be evaluated. He stated he would call someone to take him or go by ambulance because he is unable to drive.

## 2016-06-28 NOTE — ED Triage Notes (Addendum)
Patient complaining of shortness of breath, dizziness, and nausea. States "this has been going on but they can't find out what's going on. They finally found out it was my heart." Per family member patient has appointment with cardiology surgeon on Friday. Patient points to upper abdomen when asked about pain.

## 2016-06-28 NOTE — ED Provider Notes (Signed)
[name] [name]   CSN: [id number] Arrival date & time: 06/28/16  1249   By signing my name below, I, [name] [name], attest that this documentation has been prepared under the direction and in the presence of Milton Ferguson, MD . Electronically Signed: Estanislado [name], Scribe. 06/28/2016. 1:33 PM.   History   Chief Complaint Chief Complaint  Patient presents with  . Shortness of Breath  LEVEL 5 CAVEAT DUE TO ACUITY OF CONDITION   Patient complains of shortness of breath. He has congestive heart failure along with aortic stenosis and mitral regurg. He is to see the CT surgeon on Friday but the shortness of breath getting worse and he states he needs to suggest heart Dr.   The history is provided by the patient and a relative. No language interpreter was used.  Shortness of Breath  This is a chronic problem. The average episode lasts 6 weeks (worse). The problem occurs frequently.The current episode started more than 2 days ago. The problem has been gradually worsening. Pertinent negatives include no headaches, no ear pain, no cough, no chest pain, no abdominal pain and no rash. It is unknown what precipitated the problem. Treatments tried: Lasix. The treatment provided mild relief. He has had prior hospitalizations. He has had prior ED visits. He has had prior ICU admissions. Associated medical issues do not include pneumonia.   HPI Comments:  [name] [name] is a 82 y.o. male with PMHx of CKD, aortic stenosis, and asthma who presents to the Emergency Department complaining of chronic SOB that has worsened over the last 6 weeks. SOB has worsened upon laying down and with exertion. Pt has Hx. Pt has appointment to see surgeon on 07/01/16 to discuss possibility of CABG. Dr. Harl Bowie is his cardiologist.   Past Medical History:  Diagnosis Date  . Aortic stenosis    a. mod-sev by echo 05/2016.  . Asthma   . Cerebrovascular disease 07/2010   TIA; carotid ultrasound in  07/2010-significant bilateral plaque without focal internal carotid artery stenosis; MRI -encephalomalacia left temporal and right temporal lobes; small inferior right cerebellar infarct; small vessel disease  . Chronic atrial fibrillation (HCC)    Paroxysmal; Echocardiogram in 2007-normal EF; mild LVH; left atrial enlargement; mild stenosis and minimal AI; negative stress nuclear study in 2008  . Chronic systolic CHF (congestive heart failure) ([place])    a. dx 05/2016 - EF 35-40%, diffuse HK, mod-severe AS, mod gradient, severe AVA VTI likely due to decreased cardiac output in setting of systolic dysfsunction and significant mitral regurgitation, mild MR, mod-severe MR, severe LAE, mild-mod RV dilation, mild RAE, mild-mod TR, mod PASP 66mmHg.  . CKD (chronic kidney disease), stage II   . Degenerative joint disease    feet and legs  . Diabetes mellitus    no insulin; A1c of 6.6 in 2005  . Dizziness    occurs daily,especially in am  . Exertional dyspnea   . Gastroesophageal reflux disease   . Hepatic steatosis   . History of noncompliance with medical treatment   . Hyperlipidemia    adverse reactions to statins and niacin  . Hypertension    Borderline  . Irregular heartbeat   . Mitral regurgitation    a. mod-sev by echo 05/2016  . Peripheral vascular disease ([place])   . Renal insufficiency   . Temporal arteritis ([place])   . Tricuspid regurgitation    a. mild-mod TR by echo 05/2016    Patient Active Problem List   Diagnosis Date Noted  .  Anemia of chronic disease 04/12/2016  . Orthostatic hypotension 08/20/2015  . AKI (acute kidney injury) ([place]) 08/20/2015  . Asthma exacerbation 08/20/2015  . Cognitive dysfunction 05/29/2015  . Chronic back pain 07/29/2014  . Lumbar pain 06/23/2014  . Chronic pain of right ankle 05/30/2014  . Diabetic neuropathy ([place]) 05/30/2014  . Chest congestion 05/02/2013  . Wheezing 05/02/2013  . Unspecified constipation 04/30/2013  . Leg edema, left 04/27/2013    . Occlusion and stenosis of carotid artery without mention of cerebral infarction 04/25/2012  . BPH (benign prostatic hypertrophy) with urinary obstruction 09/27/2011  . Dyspnea on exertion 08/01/2011  . Diabetes mellitus ([place]) 07/14/2011  . Hypertension   . Atrial fibrillation ([place])   . Hyperlipidemia   . History of noncompliance with medical treatment   . Aortic stenosis   . Cerebrovascular disease 07/15/2010    Past Surgical History:  Procedure Laterality Date  . COLONOSCOPY  2002  . COLONOSCOPY  01/19/2012   Procedure: COLONOSCOPY;  Surgeon: Rogene [place], MD;  [facility];  Service: Endoscopy;  Laterality: N/A;  1030  . LIPOMA EXCISION  1980  . ORIF ANKLE FRACTURE  2000   Right  . PROSTATE SURGERY  12/2011  . ROTATOR CUFF REPAIR     Right  . TEE WITHOUT CARDIOVERSION N/A 06/17/2016   Procedure: TRANSESOPHAGEAL ECHOCARDIOGRAM (TEE);  Surgeon: Jerline Pain, MD;  Location: [facility];  Service: Cardiovascular;  Laterality: N/A;  . TRANSURETHRAL RESECTION OF PROSTATE  09/2011  . URETHRAL STRICTURE DILATATION  1980s       Home Medications    Prior to Admission medications   Medication Sig Start Date End Date Taking? Authorizing Provider  albuterol (PROVENTIL HFA;VENTOLIN HFA) 108 (90 Base) MCG/ACT inhaler Inhale 1-2 puffs into the lungs 5 (five) times daily as needed for wheezing or shortness of breath. 05/28/16  Yes Nat Christen, MD  furosemide (LASIX) 20 MG tablet Take 2 tablets (40 mg total) by mouth daily. 06/22/16 09/20/16 Yes Arnoldo Lenis, MD  HYDROcodone-acetaminophen (NORCO/VICODIN) 5-325 MG tablet Take 1 tablet by mouth 3 (three) times daily as needed for moderate pain. 05/05/16  Yes Kathyrn Drown, MD  metFORMIN (GLUCOPHAGE) 500 MG tablet 1/2 tablet twice a day 06/19/16  Yes Jettie Booze, MD  metoprolol succinate (TOPROL-XL) 25 MG 24 hr tablet Take 0.5 tablets (12.5 mg total) by mouth daily. 06/22/16  Yes Arnoldo Lenis, MD  nortriptyline (PAMELOR)  10 MG capsule TAKE 1 TO 2 CAPSULES AT BEDTIME FOR BURNING IN FEET. 03/29/16  Yes Kathyrn Drown, MD  traZODone (DESYREL) 100 MG tablet Take 1 tablet (100 mg total) by mouth at bedtime. 02/05/16  Yes Nilda Simmer, NP  warfarin (COUMADIN) 5 MG tablet Take 1 tablet (5 mg total) by mouth daily. TAKE AS DIRECTED BY PHYSICIAN 02/05/16  Yes Nilda Simmer, NP  enoxaparin (LOVENOX) 150 MG/ML injection Inject 1 mL (150 mg total) into the skin daily. At 8:00am Patient not taking: Reported on 06/23/2016 06/18/16   Jettie Booze, MD    Family History Family History  Problem Relation Age of Onset  . Stroke Mother   . Diabetes Father   . Colon cancer Neg Hx     Social History Social History  Substance Use Topics  . Smoking status: Former Smoker    Packs/day: 1.00    Years: 20.00    Types: Cigarettes    Start date: 07/04/1950    Quit date: 04/25/1992  . Smokeless tobacco: Current  User    Types: Chew  . Alcohol use No     Allergies   Ambien [zolpidem tartrate]; Cholestatin; Lipitor [atorvastatin calcium]; Ranitidine; Simvastatin; and Xanax xr [alprazolam er]   Review of Systems Review of Systems  Unable to perform ROS: Acuity of condition  Constitutional: Negative for appetite change and fatigue.  HENT: Negative for congestion, ear discharge, ear pain and sinus pressure.   Eyes: Negative for discharge.  Respiratory: Positive for shortness of breath. Negative for cough.   Cardiovascular: Negative for chest pain.  Gastrointestinal: Negative for abdominal pain and diarrhea.  Genitourinary: Negative for frequency and hematuria.  Musculoskeletal: Negative for back pain.  Skin: Negative for rash.  Neurological: Negative for seizures and headaches.  Psychiatric/Behavioral: Negative for hallucinations.     Physical Exam Updated Vital Signs BP 114/85 (BP Location: Left Arm)   Pulse 84   Resp (!) 32   Ht 6' (1.829 m)   Wt 216 lb (98 kg)   SpO2 97%   BMI 29.29 kg/m   Physical  Exam  Constitutional: He is oriented to person, place, and time. He appears well-developed.  HENT:  Head: Normocephalic.  Eyes: Conjunctivae and EOM are normal. No scleral icterus.  Neck: Neck supple. No thyromegaly present.  Cardiovascular: Exam reveals no gallop and no friction rub.   Tachycardia  Pulmonary/Chest: No stridor. He has no wheezes. He has no rales. He exhibits no tenderness.  Tachypneia  Abdominal: He exhibits no distension. There is no tenderness. There is no rebound.  Musculoskeletal: Normal range of motion. He exhibits no edema.  Lymphadenopathy:    He has no cervical adenopathy.  Neurological: He is oriented to person, place, and time. He exhibits normal muscle tone. Coordination normal.  Skin: No rash noted. No erythema.  Psychiatric: He has a normal mood and affect. His behavior is normal.  Nursing [name] and vitals reviewed.    ED Treatments / Results  DIAGNOSTIC STUDIES:  Oxygen Saturation is 97% on , Normal by my interpretation.    COORDINATION OF CARE:  1:09 PM Discussed treatment plan with pt at bedside and pt agreed to plan.   Labs (all labs ordered are listed, but only abnormal results are displayed) Labs Reviewed  BASIC METABOLIC PANEL  CBC WITH DIFFERENTIAL/PLATELET  HEPATIC FUNCTION PANEL  BRAIN NATRIURETIC PEPTIDE  D-DIMER, QUANTITATIVE (NOT AT [facility])  [place], ED    EKG  EKG Interpretation None       Radiology No results found.  Procedures Procedures (including critical care time)  Medications Ordered in ED Medications - No data to display   Initial Impression / Assessment and Plan / ED Course  I have reviewed the triage vital signs and the nursing notes.  Pertinent labs & imaging results that were available during my care of the patient were reviewed by me and considered in my medical decision making (see chart for details).  Clinical Course    Patient in moderate condition of heart failure.  Patient was consult by cardiology who felt like patient needs to be admitted for his heart failure  Final Clinical Impressions(s) / ED Diagnoses   Final diagnoses:  None    New Prescriptions New Prescriptions   No medications on file       Milton Ferguson, MD 06/28/16 1717

## 2016-06-28 NOTE — H&P (Signed)
[facility] History and Physical  [name] [name] [id number] DOB: 1932/03/08 DOA: 06/28/2016  Referring physician: Dr Roderic Palau PCP: Sallee Lange, MD   Chief Complaint: SOB  HPI: [name] [name] is a 82 y.o. male with history of CAF, HTN, HL, DM2, systolic CHF w reduced EF 35-40%, carotid stenosis and aortic valve stenosis.  Presenting with marked DOE , can't walk 10-15 feet and unable to lie flat last night to sleep.  Seen in ED by cardiologist and noted that wt's are down 15 lbs since last visit but still severe DOE.  Plan is for admission for [facility], diuresis as BP tolerates and planning transfer to [facility] for further evaluation.   Patient denies any CP, fevers, prod cough, no abd pain ,no n/v/d.  Voiding w/o difficutly.     Home meds > lasix 40 /d, SSI novolog, metformin, Toprol-XL, Pamelor, Desyrel, coumadin, albuterol  ROS  denies CP  no joint pain   no HA  no blurry vision  no rash  no diarrhea  no nausea/ vomiting  no dysuria  no difficulty voiding  no change in urine color    Past Medical History  Past Medical History:  Diagnosis Date  . Aortic stenosis    a. mod-sev by echo 05/2016.  . Asthma   . Cerebrovascular disease 07/2010   TIA; carotid ultrasound in 07/2010-significant bilateral plaque without focal internal carotid artery stenosis; MRI -encephalomalacia left temporal and right temporal lobes; small inferior right cerebellar infarct; small vessel disease  . Chronic atrial fibrillation (HCC)    Paroxysmal; Echocardiogram in 2007-normal EF; mild LVH; left atrial enlargement; mild stenosis and minimal AI; negative stress nuclear study in 2008  . Chronic systolic CHF (congestive heart failure) ([place])    a. dx 05/2016 - EF 35-40%, diffuse HK, mod-severe AS, mod gradient, severe AVA VTI likely due to decreased cardiac output in setting of systolic dysfsunction and significant mitral regurgitation, mild MR, mod-severe MR, severe LAE, mild-mod RV dilation, mild RAE, mild-mod  TR, mod PASP 5mmHg.  . CKD (chronic kidney disease), stage II   . Degenerative joint disease    feet and legs  . Diabetes mellitus    no insulin; A1c of 6.6 in 2005  . Dizziness    occurs daily,especially in am  . Exertional dyspnea   . Gastroesophageal reflux disease   . Hepatic steatosis   . History of noncompliance with medical treatment   . Hyperlipidemia    adverse reactions to statins and niacin  . Hypertension    Borderline  . Irregular heartbeat   . Mitral regurgitation    a. mod-sev by echo 05/2016  . Peripheral vascular disease ([place])   . Renal insufficiency   . Temporal arteritis ([place])   . Tricuspid regurgitation    a. mild-mod TR by echo 05/2016   Past Surgical History  Past Surgical History:  Procedure Laterality Date  . COLONOSCOPY  2002  . COLONOSCOPY  01/19/2012   Procedure: COLONOSCOPY;  Surgeon: Rogene Houston, MD;  Location[facility];  Service: Endoscopy;  Laterality: N/A;  1030  . LIPOMA EXCISION  1980  . ORIF ANKLE FRACTURE  2000   Right  . PROSTATE SURGERY  12/2011  . ROTATOR CUFF REPAIR     Right  . TEE WITHOUT CARDIOVERSION N/A 06/17/2016   Procedure: TRANSESOPHAGEAL ECHOCARDIOGRAM (TEE);  Surgeon: Jerline Pain, MD;  Location: [place];  Service: Cardiovascular;  Laterality: N/A;  . TRANSURETHRAL RESECTION OF PROSTATE  09/2011  . URETHRAL  STRICTURE DILATATION  1980s   Family History  Family History  Problem Relation Age of Onset  . Stroke Mother   . Diabetes Father   . Colon cancer Neg Hx    Social History  reports that he quit smoking about 24 years ago. His smoking use included Cigarettes. He started smoking about 66 years ago. He has a 20.00 pack-year smoking history. His smokeless tobacco use includes Chew. He reports that he does not drink alcohol or use drugs. Allergies  Allergies  Allergen Reactions  . Ambien [Zolpidem Tartrate] Other (See Comments)    Sleep walks  . Cholestatin   . Lipitor [Atorvastatin Calcium] Other (See  Comments)    myalgias  . Ranitidine Other (See Comments)    Chest discomfort  . Simvastatin Other (See Comments)    Myalgias  . Xanax Xr [Alprazolam Er]     Tightness in chest   Home medications Prior to Admission medications   Medication Sig Start Date End Date Taking? Authorizing Provider  albuterol (PROVENTIL HFA;VENTOLIN HFA) 108 (90 Base) MCG/ACT inhaler Inhale 1-2 puffs into the lungs 5 (five) times daily as needed for wheezing or shortness of breath. 05/28/16  Yes Nat Christen, MD  furosemide (LASIX) 20 MG tablet Take 2 tablets (40 mg total) by mouth daily. 06/22/16 09/20/16 Yes Arnoldo Lenis, MD  HYDROcodone-acetaminophen (NORCO/VICODIN) 5-325 MG tablet Take 1 tablet by mouth 3 (three) times daily as needed for moderate pain. 05/05/16  Yes Kathyrn Drown, MD  metFORMIN (GLUCOPHAGE) 500 MG tablet 1/2 tablet twice a day 06/19/16  Yes Jettie Booze, MD  metoprolol succinate (TOPROL-XL) 25 MG 24 hr tablet Take 0.5 tablets (12.5 mg total) by mouth daily. 06/22/16  Yes Arnoldo Lenis, MD  nortriptyline (PAMELOR) 10 MG capsule TAKE 1 TO 2 CAPSULES AT BEDTIME FOR BURNING IN FEET. 03/29/16  Yes Kathyrn Drown, MD  traZODone (DESYREL) 100 MG tablet Take 1 tablet (100 mg total) by mouth at bedtime. 02/05/16  Yes Nilda Simmer, NP  warfarin (COUMADIN) 5 MG tablet Take 1 tablet (5 mg total) by mouth daily. TAKE AS DIRECTED BY PHYSICIAN 02/05/16  Yes Nilda Simmer, NP  enoxaparin (LOVENOX) 150 MG/ML injection Inject 1 mL (150 mg total) into the skin daily. At 8:00am Patient not taking: Reported on 06/23/2016 06/18/16   Jettie Booze, MD   Liver Function Tests  Recent Labs Lab 06/28/16 1316  AST 24  ALT 27  ALKPHOS 72  BILITOT 1.7*  PROT 7.1  ALBUMIN 3.9   No results for input(s): LIPASE, AMYLASE in the last 168 hours. CBC  Recent Labs Lab 06/28/16 1316  WBC 6.8  NEUTROABS 5.1  HGB 15.2  HCT 44.6  MCV 89.6  PLT 123456   Basic Metabolic Panel  Recent Labs Lab  06/28/16 1316  NA 137  K 3.1*  CL 99*  CO2 28  GLUCOSE 118*  BUN 19  CREATININE 1.10  CALCIUM 9.3     Vitals:   06/28/16 1430 06/28/16 1500 06/28/16 1530 06/28/16 1721  BP: 119/71 101/78 124/87 117/80  Pulse: 89 (!) 55 96 93  Resp:   23 (!) 29  SpO2: 97% 98% 96% 98%  Weight:      Height:       Exam: Gen eldelry, dyspneic, can only finish about 1/2 a sentence at a time No rash, cyanosis or gangrene Sclera anicteric, throat clear  No jvd or bruits Chest bilat fine rales 1/3 up post, no wheezing Cor  irreg irreg 2/6 SEM no RG Abd soft ntnd no mass or ascites +bs no hsm GU normal male MS no joint effusions or deformity, diffuse skin spotting and darkening of the lower legs below the knee Ext trace pretib edema / no wounds or ulcers Neuro is alert, Ox 3 , nf   Na 137 K 3.1  BUN 19 Cr 1.10  Glu 118  WBC 6k  Hb 15  INR 1.66  EKG (independ reviewed) > afib 101, no ischemic changes CXR (independ reviewed) > Small bilateral pleural effusions with right basilar atelectasis.  Home meds > lasix 40 /d, SSI novolog, metformin, Toprol-XL, Pamelor, Desyrel, coumadin, albuterol  Assessment: 1.  Severe dyspnea - not hypoxemic and CXR w effusions only.  Per cardiology eval likely has mixed picture due to low EF, 2-vessel CAD and valve disease (AS, MR).  Plan is for admit, diurese, and prob transfer to [facility] soon.  Started on IV lasix 40 mg bid.   2.  CAD 2-vessel by recent cath, no intervention done yet 3.  Valvular heart dz - AS and MR 4.  Afib, chronic- on coumadin/ Toprol 5.  Carotid stenosis 6.  DM on SSI and metformin, cont  Plan - as above     Sol Blazing [facility] Pager [phone]  Cell [phone]  If 7PM-7AM, please contact night-coverage [email or web address] Password [facility] 06/28/2016, 6:03 PM

## 2016-06-29 DIAGNOSIS — R0600 Dyspnea, unspecified: Secondary | ICD-10-CM

## 2016-06-29 LAB — BASIC METABOLIC PANEL
Anion gap: 11 (ref 5–15)
BUN: 20 mg/dL (ref 6–20)
CALCIUM: 9.1 mg/dL (ref 8.9–10.3)
CHLORIDE: 97 mmol/L — AB (ref 101–111)
CO2: 29 mmol/L (ref 22–32)
CREATININE: 1.16 mg/dL (ref 0.61–1.24)
GFR calc Af Amer: >60 mL/min (ref >60)
GFR, EST NON AFRICAN AMERICAN: 57 mL/min — AB (ref >60)
GLUCOSE: 148 mg/dL — AB (ref 65–99)
Potassium: 3.1 mmol/L — ABNORMAL LOW (ref 3.5–5.1)
Sodium: 137 mmol/L (ref 135–145)

## 2016-06-29 LAB — GLUCOSE, CAPILLARY
GLUCOSE-CAPILLARY: 147 mg/dL — AB (ref 65–99)
GLUCOSE-CAPILLARY: 141 mg/dL — AB (ref 65–99)
GLUCOSE-CAPILLARY: 186 mg/dL — AB (ref 65–99)
Glucose-Capillary: 156 mg/dL — ABNORMAL HIGH (ref 65–99)
Glucose-Capillary: 175 mg/dL — ABNORMAL HIGH (ref 65–99)
Glucose-Capillary: 124 mg/dL — ABNORMAL HIGH (ref 65–99)
Glucose-Capillary: 122 mg/dL — ABNORMAL HIGH (ref 65–99)

## 2016-06-29 LAB — PROTIME-INR
INR: 1.72
PROTHROMBIN TIME: 20.4 s — AB (ref 11.4–15.2)

## 2016-06-29 LAB — MRSA PCR SCREENING: MRSA by PCR: NEGATIVE

## 2016-06-29 MED ORDER — METOPROLOL SUCCINATE ER 25 MG PO TB24
12.5000 mg | ORAL_TABLET | Freq: Every day | ORAL | Status: DC
  Administered 2016-06-29 – 2016-06-30 (×2): 12.5 mg via ORAL
  Filled 2016-06-29 (×2): qty 1

## 2016-06-29 MED ORDER — WARFARIN SODIUM 5 MG PO TABS
5.0000 mg | ORAL_TABLET | Freq: Once | ORAL | Status: AC
  Administered 2016-06-29: 5 mg via ORAL
  Filled 2016-06-29: qty 1

## 2016-06-29 MED ORDER — WARFARIN - PHYSICIAN DOSING INPATIENT: Status: DC

## 2016-06-29 MED ORDER — POTASSIUM CHLORIDE CRYS ER 20 MEQ PO TBCR
40.0000 meq | EXTENDED_RELEASE_TABLET | Freq: Two times a day (BID) | ORAL | Status: AC
  Administered 2016-06-29 (×2): 40 meq via ORAL
  Filled 2016-06-29 (×2): qty 2

## 2016-06-29 NOTE — Progress Notes (Signed)
PROGRESS NOTE    [name] [name]  [id number] DOB: 1932-05-19 DOA: 06/28/2016 PCP: Sallee Lange, MD    Brief Narrative:  82 y.o. male with history of CAF, HTN, HL, DM2, systolic CHF w reduced EF 35-40%, carotid stenosis and aortic valve stenosis.  Presenting with marked DOE , can't walk 10-15 feet and unable to lie flat last night to sleep.  Seen in ED by cardiologist and noted that wt's are down 15 lbs since last visit but still severe DOE.  Plan is for admission for [facility], diuresis as BP tolerates and planning transfer to [facility] for further evaluation   Assessment & Plan:   Principal Problem:   Dyspnea Active Problems:   Hypertension   Atrial fibrillation (HCC)   Aortic stenosis   Diabetes mellitus (HCC)   Carotid stenosis   Systolic congestive heart failure (HCC)   CAD (coronary artery disease), native coronary artery   CHF (congestive heart failure) ([place])   1.  Severe dyspnea - not hypoxemic and CXR w effusions only.  Per cardiology eval likely has mixed picture due to low EF, 2-vessel CAD and valve disease (AS, MR).   patient was started on IV twice a day Lasix. Cardiology was consulted. His case with cardiology today with plans to transfer to [facility] for CT surgery evaluation.   2.  CAD 2-vessel by recent cath, no intervention done yet 3.  Valvular heart dz - AS and MR 4.  Afib, chronic- on coumadin/ Toprol 5.  Carotid stenosis 6.  DM on SSI and metformin, cont  DVT prophylaxis: Coumadin Code Status: Full Family Communication: Patient in room, family at bedside Disposition Plan: Transfer to [facility]  Consultants:   Cardiology  Procedures:     Antimicrobials: Anti-infectives    None       Subjective: No complaints at present  Objective: Vitals:   06/29/16 1300 06/29/16 1400 06/29/16 1500 06/29/16 1700  BP: 118/87 107/76  121/71  Pulse: 72 (!) 35 (!) 131 97  Resp: (!) 33 (!) 43 (!) 32 (!) 22  Temp:    97 F (36.1 C)  TempSrc:    Oral  SpO2: 99%  98% 98% 100%  Weight:    96.7 kg (213 lb 1.6 oz)  Height:    6\' 1"  (1.854 m)    Intake/Output Summary (Last 24 hours) at 06/29/16 1832 Last data filed at 06/29/16 1227  Gross per 24 hour  Intake              480 ml  Output              825 ml  Net             -345 ml   Filed Weights   06/28/16 1256 06/28/16 2021 06/29/16 1700  Weight: 98 kg (216 lb) 97.9 kg (215 lb 13.3 oz) 96.7 kg (213 lb 1.6 oz)    Examination:  General exam: Appears calm and comfortable, Sitting in chair  Respiratory system: Clear to auscultation. Respiratory effort normal. Cardiovascular system: S1 & S2 heard, RRR.  Gastrointestinal system: Abdomen is nondistended, soft and nontender. No organomegaly or masses felt. Normal bowel sounds heard. Central nervous system: Alert and oriented. No focal neurological deficits. Extremities: Symmetric 5 x 5 power. Skin: No rashes, lesions Psychiatry: Judgement and insight appear normal. Mood & affect appropriate.   Data Reviewed: I have personally reviewed following labs and imaging studies  CBC:  Recent Labs Lab 06/28/16 1316  WBC 6.8  NEUTROABS 5.1  HGB 15.2  HCT 44.6  MCV 89.6  PLT 123456   Basic Metabolic Panel:  Recent Labs Lab 06/28/16 1316 06/29/16 0405  NA 137 137  K 3.1* 3.1*  CL 99* 97*  CO2 28 29  GLUCOSE 118* 148*  BUN 19 20  CREATININE 1.10 1.16  CALCIUM 9.3 9.1   GFR: Estimated Creatinine Clearance: 61.2 mL/min (by C-G formula based on SCr of 1.16 mg/dL). Liver Function Tests:  Recent Labs Lab 06/28/16 1316  AST 24  ALT 27  ALKPHOS 72  BILITOT 1.7*  PROT 7.1  ALBUMIN 3.9   No results for input(s): LIPASE, AMYLASE in the last 168 hours. No results for input(s): AMMONIA in the last 168 hours. Coagulation Profile:  Recent Labs Lab 06/28/16 1316 06/29/16 0405  INR 1.66 1.72   Cardiac Enzymes: No results for input(s): CKTOTAL, CKMB, CKMBINDEX, TROPONINI in the last 168 hours. BNP (last 3 results) No results for  input(s): PROBNP in the last 8760 hours. HbA1C: No results for input(s): HGBA1C in the last 72 hours. CBG:  Recent Labs Lab 06/29/16 0723 06/29/16 1139 06/29/16 1452 06/29/16 1646 06/29/16 1808  GLUCAP 156* 175* 147* 124* 141*   Lipid Profile: No results for input(s): CHOL, HDL, LDLCALC, TRIG, CHOLHDL, LDLDIRECT in the last 72 hours. Thyroid Function Tests: No results for input(s): TSH, T4TOTAL, FREET4, T3FREE, THYROIDAB in the last 72 hours. Anemia Panel: No results for input(s): VITAMINB12, FOLATE, FERRITIN, TIBC, IRON, RETICCTPCT in the last 72 hours. Sepsis Labs: No results for input(s): PROCALCITON, LATICACIDVEN in the last 168 hours.  Recent Results (from the past 240 hour(s))  MRSA PCR Screening     Status: None   Collection Time: 06/29/16  2:15 AM  Result Value Ref Range Status   MRSA by PCR NEGATIVE NEGATIVE Final    Comment:        The GeneXpert MRSA Assay (FDA approved for NASAL specimens only), is one component of a comprehensive MRSA colonization surveillance program. It is not intended to diagnose MRSA infection nor to guide or monitor treatment for MRSA infections.      Radiology Studies: Dg Chest 2 View  Result Date: 06/28/2016 CLINICAL DATA:  Shortness of Breath EXAM: CHEST  2 VIEW COMPARISON:  05/28/2016 FINDINGS: Cardiac shadow is mildly enlarged but stable. Right-sided pleural effusion and right basilar atelectasis is again noted and stable. A small left pleural effusion is noted. No focal confluent infiltrate is seen. No acute bony abnormality is noted. IMPRESSION: Small bilateral pleural effusions with right basilar atelectasis. Electronically Signed   By: Inez Catalina M.D.   On: 06/28/2016 13:57    Scheduled Meds: . furosemide  40 mg Intravenous BID  . insulin aspart  0-9 Units Subcutaneous TID WC  . metFORMIN  250 mg Oral BID WC  . metoprolol succinate  12.5 mg Oral Daily  . potassium chloride  40 mEq Oral BID  . sodium chloride flush  3 mL  Intravenous Q12H  . traZODone  100 mg Oral QHS  . [START ON 06/30/2016] Warfarin - Physician Dosing Inpatient   Does not apply Q24H   Continuous Infusions:    LOS: 1 day   CHIU, Orpah Melter, MD [facility] Pager [phone]  If 7PM-7AM, please contact night-coverage [email or web address] Password [facility] 06/29/2016, 6:32 PM

## 2016-06-29 NOTE — Progress Notes (Signed)
[facility]  Patient Status: Active (receiving services up to time of hospitalization)  [facility] is providing the following services: RN and PT  If patient discharges after hours, please call [phone].   [name] [name] 06/29/2016, 5:07 PM

## 2016-06-29 NOTE — Care Management Note (Signed)
Case Management Note  Patient Details  Name: [name] [name] MRN: [id number] Date of Birth: Jan 20, 1932  Subjective/Objective:                  Admitted with CHF. Pt is from home, lives with his wife, both of which receive strong family support. Pt is active with [facility] for [facility] nursing and PT services. Romualdo Bolk, of [facility], aware of admission. Pt uses a cane for ambulation and has a walker and WC if needed. He does not use supplemental oxygen at home but is on it here in the hospital. His PCP is Dr. Mickie Hillier, he has family who drive pt to appointments and he has no difficulty affording medications. At this time family anticipate pt will return home with resumption of [facility] services. Pt will transfer to [facility] today for continued care.  Action/Plan: CM at [facility] to continue to follow for DC planning needs.   Expected Discharge Date:     07/08/2016             Expected Discharge Plan:  [place]  In-House Referral:  NA  Discharge planning Services  CM Consult  Post Acute Care Choice:  Home Health, Resumption of Svcs/PTA Provider Choice offered to:  Patient  DME Arranged:    DME Agency:     HH Arranged:  RN, PT [place] Agency:  [place]  Status of Service:  In process, will continue to follow  If discussed at Long Length of Stay Meetings, dates discussed:    Additional Comments:  Sherald Barge, RN 06/29/2016, 4:02 PM

## 2016-06-29 NOTE — Progress Notes (Signed)
[name] [name] for Warfarin Indication: atrial fibrillation  Allergies  Allergen Reactions  . Ambien [Zolpidem Tartrate] Other (See Comments)    Sleep walks  . Cholestatin   . Lipitor [Atorvastatin Calcium] Other (See Comments)    myalgias  . Ranitidine Other (See Comments)    Chest discomfort  . Simvastatin Other (See Comments)    Myalgias  . Xanax Xr [Alprazolam Er]     Tightness in chest    Patient Measurements: Height: 6\' 1"  (185.4 cm) Weight: 215 lb 13.3 oz (97.9 kg) IBW/kg (Calculated) : 79.9  Vital Signs: Temp: 97.7 F (36.5 C) (08/16 0400) Temp Source: Oral (08/16 0400) BP: 127/104 (08/16 0500) Pulse Rate: 96 (08/16 0500)  Labs:  Recent Labs  06/28/16 1316 06/29/16 0405  HGB 15.2  --   HCT 44.6  --   PLT 266  --   LABPROT 19.8* 20.4*  INR 1.66 1.72  CREATININE 1.10 1.16    Estimated Creatinine Clearance: 61.5 mL/min (by C-G formula based on SCr of 1.16 mg/dL).   Medications:  Facility-Administered Medications Prior to Admission  Medication Dose Route Frequency Provider Last Rate Last Dose  . insulin aspart (novoLOG) injection 0-9 Units  0-9 Units Subcutaneous Q4H Dayna N Dunn, PA-C       Prescriptions Prior to Admission  Medication Sig Dispense Refill Last Dose  . albuterol (PROVENTIL HFA;VENTOLIN HFA) 108 (90 Base) MCG/ACT inhaler Inhale 1-2 puffs into the lungs 5 (five) times daily as needed for wheezing or shortness of breath. 1 Inhaler 0 06/28/2016 at Unknown time  . furosemide (LASIX) 20 MG tablet Take 2 tablets (40 mg total) by mouth daily. 180 tablet 3 06/28/2016 at Unknown time  . HYDROcodone-acetaminophen (NORCO/VICODIN) 5-325 MG tablet Take 1 tablet by mouth 3 (three) times daily as needed for moderate pain. 90 tablet 0 06/28/2016 at Unknown time  . metFORMIN (GLUCOPHAGE) 500 MG tablet 1/2 tablet twice a day 30 tablet 5 06/28/2016 at Unknown time  . metoprolol succinate (TOPROL-XL) 25 MG 24 hr tablet Take 0.5  tablets (12.5 mg total) by mouth daily. 90 tablet 3 06/28/2016 at 0800  . nortriptyline (PAMELOR) 10 MG capsule TAKE 1 TO 2 CAPSULES AT BEDTIME FOR BURNING IN FEET. 60 capsule 5 06/27/2016 at Unknown time  . traZODone (DESYREL) 100 MG tablet Take 1 tablet (100 mg total) by mouth at bedtime. 30 tablet 5 06/27/2016 at Unknown time  . warfarin (COUMADIN) 5 MG tablet Take 1 tablet (5 mg total) by mouth daily. TAKE AS DIRECTED BY PHYSICIAN 30 tablet 5 06/28/2016 at 0800    Assessment: Okay for Protocol, Patient received 5mg  last evening.   Goal of Therapy:  INR 2-3   Plan:  Warfarin 5mg  PO x 1 Daily PT/INR Monitor for signs and symptoms of bleeding.   [name] 06/29/2016,8:02 AM

## 2016-06-29 NOTE — Care Management Important Message (Signed)
Important Message  Patient Details  Name: AZEL BENEDETTI MRN: [id number] Date of Birth: 1932/03/20   Medicare Important Message Given:  Yes    Sherald Barge, RN 06/29/2016, 4:06 PM

## 2016-06-29 NOTE — Progress Notes (Addendum)
Subjective:    Breathing mildy improved this AM. Did not sleep well, tired this AM.   Objective:   Temp:  [96.8 F (36 C)-98.1 F (36.7 C)] 96.8 F (36 C) (08/16 0820) Pulse Rate:  [36-117] 96 (08/16 0500) Resp:  [15-32] 15 (08/16 0500) BP: (94-141)/(55-104) 127/104 (08/16 0500) SpO2:  [92 %-99 %] 98 % (08/16 0500) Weight:  [215 lb 13.3 oz (97.9 kg)-216 lb (98 kg)] 215 lb 13.3 oz (97.9 kg) (08/15 2021)    Filed Weights   06/28/16 1256 06/28/16 2021  Weight: 216 lb (98 kg) 215 lb 13.3 oz (97.9 kg)    Intake/Output Summary (Last 24 hours) at 06/29/16 [id number] Last data filed at 06/29/16 0004  Gross per 24 hour  Intake                0 ml  Output              600 ml  Net             -600 ml    Telemetry: afib, some elevated rates.   Exam:  General: NAD  HEENT: sclera clear, throat clear  Resp: mild crackles bilateral bases  Cardiac: irreg, 3/6 systolic murmur RUSB, 3/6 systolic murmur apex  GI: abdomen soft, NT, ND  MSK: trace bilateral Le edema  Neuro: no focal deficits   Psych: appropriate affect  Lab Results:  Basic Metabolic Panel:  Recent Labs Lab 06/28/16 1316 06/29/16 0405  NA 137 137  K 3.1* 3.1*  CL 99* 97*  CO2 28 29  GLUCOSE 118* 148*  BUN 19 20  CREATININE 1.10 1.16  CALCIUM 9.3 9.1    Liver Function Tests:  Recent Labs Lab 06/28/16 1316  AST 24  ALT 27  ALKPHOS 72  BILITOT 1.7*  PROT 7.1  ALBUMIN 3.9    CBC:  Recent Labs Lab 06/28/16 1316  WBC 6.8  HGB 15.2  HCT 44.6  MCV 89.6  PLT 266    Cardiac Enzymes: No results for input(s): CKTOTAL, CKMB, CKMBINDEX, TROPONINI in the last 168 hours.  BNP: No results for input(s): PROBNP in the last 8760 hours.  Coagulation:  Recent Labs Lab 06/22/16 1226 06/28/16 1316 06/29/16 0405  INR 2.7 1.66 1.72    ECG:   Medications:   Scheduled Medications: . furosemide  40 mg Intravenous BID  . insulin aspart  0-9 Units Subcutaneous TID WC  . metFORMIN  250 mg  Oral BID WC  . potassium chloride  40 mEq Oral BID  . sodium chloride flush  3 mL Intravenous Q12H  . traZODone  100 mg Oral QHS  . warfarin  5 mg Oral Once  . [START ON 06/30/2016] Warfarin - Physician Dosing Inpatient   Does not apply Q24H     Infusions:     PRN Medications:  sodium chloride, acetaminophen **OR** acetaminophen, bisacodyl, HYDROcodone-acetaminophen, nortriptyline, ondansetron **OR** ondansetron (ZOFRAN) IV, sodium chloride flush     Assessment/Plan    1. Acute on chronic systolic HF - new diagnosis last month. LVEF 35-40%, with his MR LVEF likely truly even lower. Etiology likely mixed with his 2 vessel CAD and moderate to severe AS and moderate to severe MR - despite significant diuresis symptoms have aggressively progressed over the last few weeks, class IV CHF.  - low CI during RHC noted, Toprol was decreased at last clinic visit. Not on ACE/ARB/entresto/aldactone due to soft bp's and orthostatic symptoms. Lisinopril recently stopped  - we  will admit him for diuresis, will likely consider transfer to [facility] over the next few days to be evaluated by CHF service and CT surgery given his rapid progression of symptoms  - started on IV lasix 40mg  IV bid, negative 648mL over night though I/Os incomplete. Mild uptrend in Cr and BUN.   2. CAD - 2 vessel CAD by recent cath. He was to see CT surgery as outpatient to consider possible CABG along with AVR/MVR - if transferred will need inpatient evaluation  3. Vavluar heart disease - moderate to severe AS in setting of low LVEF, moderate to severe MR - will need consideration for valve replacement with CABG if surgical candidtate  4. Afib - continue current meds - elevated rates overnight, have normalized this AM. Continue to follow.   5. Carotid stenosis - severe carotid stenosis RICA 80-99%, he was to see vascular surgery at outpatient - will need to be considered if he is a possible CABG/valve surgery  candidate  6. Afib - continue current meds. CHADS2Vasc score of 6, he will continue coumadin.        Carlyle Dolly, M.D.

## 2016-06-29 NOTE — Progress Notes (Signed)
25 Transferred in from [facility] The patient is brought to by [place] transport TEam.Pt with Sob on exertion. .Able to talk in broken speech .Able to understand pt well. Kept comfortable in bed  Placed on o2 2 lmnc

## 2016-06-29 NOTE — Progress Notes (Signed)
[facility] just picked up patient for transport to [facility] and I am now calling [facility] to talk to RN

## 2016-06-30 ENCOUNTER — Inpatient Hospital Stay (HOSPITAL_COMMUNITY): Payer: Medicare Other

## 2016-06-30 DIAGNOSIS — R570 Cardiogenic shock: Secondary | ICD-10-CM

## 2016-06-30 DIAGNOSIS — I350 Nonrheumatic aortic (valve) stenosis: Secondary | ICD-10-CM

## 2016-06-30 DIAGNOSIS — J9601 Acute respiratory failure with hypoxia: Secondary | ICD-10-CM

## 2016-06-30 DIAGNOSIS — I480 Paroxysmal atrial fibrillation: Secondary | ICD-10-CM

## 2016-06-30 DIAGNOSIS — I6521 Occlusion and stenosis of right carotid artery: Secondary | ICD-10-CM

## 2016-06-30 DIAGNOSIS — I340 Nonrheumatic mitral (valve) insufficiency: Secondary | ICD-10-CM

## 2016-06-30 DIAGNOSIS — I2510 Atherosclerotic heart disease of native coronary artery without angina pectoris: Secondary | ICD-10-CM

## 2016-06-30 LAB — MAGNESIUM: Magnesium: 1.9 mg/dL (ref 1.7–2.4)

## 2016-06-30 LAB — GLUCOSE, CAPILLARY
Glucose-Capillary: 137 mg/dL — ABNORMAL HIGH (ref 65–99)
Glucose-Capillary: 207 mg/dL — ABNORMAL HIGH (ref 65–99)
Glucose-Capillary: 139 mg/dL — ABNORMAL HIGH (ref 65–99)
Glucose-Capillary: 145 mg/dL — ABNORMAL HIGH (ref 65–99)

## 2016-06-30 LAB — CARBOXYHEMOGLOBIN
CARBOXYHEMOGLOBIN: 1 % (ref 0.5–1.5)
METHEMOGLOBIN: 0.8 % (ref 0.0–1.5)
O2 SAT: 45.7 %
TOTAL HEMOGLOBIN: 14.5 g/dL (ref 13.5–18.0)

## 2016-06-30 LAB — PROTIME-INR
INR: 1.53
Prothrombin Time: 18.5 seconds — ABNORMAL HIGH (ref 11.4–15.2)

## 2016-06-30 MED ORDER — SODIUM CHLORIDE 0.9% FLUSH
10.0000 mL | INTRAVENOUS | Status: DC | PRN
  Administered 2016-07-09: 10 mL
  Filled 2016-06-30: qty 40

## 2016-06-30 MED ORDER — AMIODARONE HCL IN DEXTROSE 360-4.14 MG/200ML-% IV SOLN
30.0000 mg/h | INTRAVENOUS | Status: DC
  Administered 2016-06-30 – 2016-07-08 (×12): 30 mg/h via INTRAVENOUS
  Filled 2016-06-30 (×16): qty 200

## 2016-06-30 MED ORDER — AMIODARONE HCL IN DEXTROSE 360-4.14 MG/200ML-% IV SOLN
60.0000 mg/h | INTRAVENOUS | Status: DC
  Administered 2016-06-30 (×2): 60 mg/h via INTRAVENOUS
  Filled 2016-06-30: qty 400

## 2016-06-30 MED ORDER — POTASSIUM CHLORIDE CRYS ER 20 MEQ PO TBCR
40.0000 meq | EXTENDED_RELEASE_TABLET | Freq: Two times a day (BID) | ORAL | Status: DC
  Administered 2016-06-30 – 2016-07-07 (×15): 40 meq via ORAL
  Filled 2016-06-30 (×16): qty 2

## 2016-06-30 MED ORDER — MILRINONE LACTATE IN DEXTROSE 20-5 MG/100ML-% IV SOLN
0.1250 ug/kg/min | INTRAVENOUS | Status: DC
  Administered 2016-06-30: 0.125 ug/kg/min via INTRAVENOUS
  Filled 2016-06-30: qty 100

## 2016-06-30 MED ORDER — HEPARIN (PORCINE) IN NACL 100-0.45 UNIT/ML-% IJ SOLN: 1400.0000 [IU]/h | INTRAMUSCULAR | Status: DC

## 2016-06-30 MED ORDER — HEPARIN (PORCINE) IN NACL 100-0.45 UNIT/ML-% IJ SOLN
1300.0000 [IU]/h | INTRAMUSCULAR | Status: DC
  Administered 2016-06-30 – 2016-07-07 (×8): 1400 [IU]/h via INTRAVENOUS
  Administered 2016-07-08: 1300 [IU]/h via INTRAVENOUS
  Administered 2016-07-08: 1400 [IU]/h via INTRAVENOUS
  Administered 2016-07-09 – 2016-07-10 (×2): 1300 [IU]/h via INTRAVENOUS
  Filled 2016-06-30 (×14): qty 250

## 2016-06-30 MED ORDER — FUROSEMIDE 10 MG/ML IJ SOLN
80.0000 mg | Freq: Two times a day (BID) | INTRAMUSCULAR | Status: DC
  Administered 2016-06-30 (×2): 80 mg via INTRAVENOUS
  Filled 2016-06-30 (×2): qty 8

## 2016-06-30 MED ORDER — SODIUM CHLORIDE 0.9% FLUSH
10.0000 mL | Freq: Two times a day (BID) | INTRAVENOUS | Status: DC
  Administered 2016-06-30 – 2016-07-08 (×10): 10 mL
  Administered 2016-07-08: 30 mL
  Administered 2016-07-09 – 2016-07-13 (×5): 10 mL
  Administered 2016-07-14: 20 mL
  Administered 2016-07-15 – 2016-07-18 (×5): 10 mL

## 2016-06-30 MED ORDER — AMIODARONE LOAD VIA INFUSION
150.0000 mg | Freq: Once | INTRAVENOUS | Status: AC
  Administered 2016-06-30: 150 mg via INTRAVENOUS
  Filled 2016-06-30: qty 83.34

## 2016-06-30 MED ORDER — CETYLPYRIDINIUM CHLORIDE 0.05 % MT LIQD
7.0000 mL | Freq: Two times a day (BID) | OROMUCOSAL | Status: DC
  Administered 2016-07-01 – 2016-07-08 (×13): 7 mL via OROMUCOSAL

## 2016-06-30 MED ORDER — POTASSIUM CHLORIDE CRYS ER 20 MEQ PO TBCR
40.0000 meq | EXTENDED_RELEASE_TABLET | ORAL | Status: AC
  Administered 2016-06-30 (×2): 40 meq via ORAL
  Filled 2016-06-30 (×2): qty 2

## 2016-06-30 NOTE — Consult Note (Addendum)
[address]       [address],[place] [address]             [phone]          CARDIOTHORACIC SURGERY CONSULTATION REPORT  PCP is Sallee Lange, MD Referring Provider is Bensimhon, Shaune Pascal, MD Primary Cardiologist is Arnoldo Lenis, MD  Reason for consultation:  Severe aortic stenosis, mitral regurgitation, coronary artery disease and atrial fibrillation  HPI:  Patient is an 82 year old male with long-standing history of aortic stenosis, chronic atrial fibrillation, hypertension, type II diabetes mellitus w/ complications, hyperlipidemia, and cerebrovascular disease who has recently been diagnosed with severe low gradient low ejection fraction aortic stenosis, severe mitral regurgitation, and acute on chronic systolic congestive heart failure with cardiogenic shock who has been referred for surgical consultation to discuss treatment options. The patient has a long history of aortic stenosis and chronic atrial fibrillation on long-term anticoagulation using warfarin. In the past the patient had been followed by Dr. Lattie Haw.  He was last seen by Bunnie Domino and Dr. Izell Wisner office on 04/27/2012.  Echocardiogram performed 04/12/2012 revealed moderate aortic stenosis with mean transvalvular gradient estimated 18 mmHg, aortic valve area calculated 1.16 cm, and left ventricular ejection fraction estimated 50-55%. The patient states that over the past 4-5 years he has experienced slow gradual decline in exercise tolerance with progressive exertional shortness of breath and fatigue. He still remained reasonably active physically until January of this year. Over the past 6 months the patient has experienced precipitous decline in his exercise tolerance with severe exertional shortness of breath, intermittent dizzy spells with near syncopal episodes, intermittent resting shortness of breath, lower extremity edema, and a tendency to simply "give out" with any significant  activity. He was seen in follow-up by his primary care physician and referred for cardiology evaluation approximately one month ago.  Follow-up echocardiogram performed 06/01/2016 revealed severe low gradient low ejection fraction aortic stenosis. Peak velocity across the aortic valve was reported 2.6 m/s corresponding to mean transvalvular gradient estimated 17 mmHg.  The dimensionless velocity ratio was measured 0.16.  Left ventricular ejection fraction was estimated 35-40% with diffuse global hypokinesis. There was moderate to severe mitral regurgitation, severe left atrial enlargement, mild to moderate tricuspid regurgitation, and findings suggestive of moderate pulmonary hypertension.  The patient underwent outpatient transesophageal echocardiogram and left and right heart catheterization on 06/17/2016. TEE revealed moderate to severe left ventricular systolic dysfunction with ejection fraction estimated 30-35%. There was severe aortic stenosis with mild aortic insufficiency. The dimensionless velocity ratio was measured 0.2 with peak velocity across the aortic valve measured 3.2 m/s corresponding to mean transvalvular gradient estimated 22 mmHg. There was moderate to severe mitral regurgitation with the jet of regurgitation directed eccentrically and posteriorly.  PISA was felt to be possibly inaccurate but ERO measured 0.2 cm corresponding to regurgitant volume estimated 38 mL.  Diagnostic cardiac catheterization revealed moderate coronary artery disease with 75% stenosis of the mid left anterior descending coronary artery, 80% stenosis of the distal left circumflex coronary artery, and otherwise moderate nonobstructive disease. Attempts to cross the aortic valve were unsuccessful at catheterization.  Right heart catheterization revealed moderate pulmonary hypertension with PA pressures measured 52/23, mean pulmonary A wedge pressure measured 25 mmHg, central venous pressure 11 mmHg, and low cardiac output  (3.2 L/m) and mixed venous oxygen saturation (52%).  The patient was seen in follow-up by Dr. Harl Bowie and his dose of beta blocker was decreased and dose of Lasix  increased. Outpatient carotid duplex scan revealed severe right internal carotid artery stenosis.  He was referred for surgical consultation but prior to his office visit the patient presented acutely to the emergency department 06/28/2016 with further progression of symptoms of shortness of breath and orthopnea. He was admitted to [facility] under the care and direction of Dr. Haroldine Laws and the advanced heart failure team. Cardiothoracic surgical consultation was requested.  The patient is married and lives in [place] with his wife. 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 He has remained reasonably active physically throughout his retirement until recently. He describes a gradual progressive decline in his exercise tolerance over the last 4-5 years. He still was doing fairly well until January of this year when he began to go downhill more rapidly. He has lost at least 15-20 pounds in weight. His appetite is been marginal. He is developed progressive exertional shortness of breath with intermittent resting shortness of breath, PND, orthopnea, and lower extremity edema. He has had occasional dizzy spells with several near syncopal episodes associated with false. He has not had any chest pain or chest tightness with activity or at rest. He also has been experiencing occasional episodes of transient monocular blindness and blurry vision that affect only the left eye. He has not had any transient numbness or weakness involving either upper or lower extremity. His mobility is limited primarily by exertional shortness of breath and generalized weakness. He does have some degenerative arthritis involving his lumbar spine and knees. He walks using a cane or walker for assistance.  He has a  nephew and niece who are very supportive, involved in his healthcare and personal matters, and at the bedside for consultation this evening.  Past Medical History:  Diagnosis Date  . Aortic stenosis    a. mod-sev by echo 05/2016.  . Asthma   . Cerebrovascular disease 07/2010   TIA; carotid ultrasound in 07/2010-significant bilateral plaque without focal internal carotid artery stenosis; MRI -encephalomalacia left temporal and right temporal lobes; small inferior right cerebellar infarct; small vessel disease  . Chronic atrial fibrillation (HCC)    Paroxysmal; Echocardiogram in 2007-normal EF; mild LVH; left atrial enlargement; mild stenosis and minimal AI; negative stress nuclear study in 2008  . Chronic systolic CHF (congestive heart failure) ([place])    a. dx 05/2016 - EF 35-40%, diffuse HK, mod-severe AS, mod gradient, severe AVA VTI likely due to decreased cardiac output in setting of systolic dysfsunction and significant mitral regurgitation, mild MR, mod-severe MR, severe LAE, mild-mod RV dilation, mild RAE, mild-mod TR, mod PASP 41mmHg.  . CKD (chronic kidney disease), stage II   . Degenerative joint disease    feet and legs  . Diabetes mellitus    no insulin; A1c of 6.6 in 2005  . Dizziness    occurs daily,especially in am  . Exertional dyspnea   . Gastroesophageal reflux disease   . Hepatic steatosis   . History of noncompliance with medical treatment   . Hyperlipidemia    adverse reactions to statins and niacin  . Hypertension    Borderline  . Irregular heartbeat   . Mitral regurgitation    a. mod-sev by echo 05/2016  . Peripheral vascular disease ([place])   . Renal insufficiency   . Temporal arteritis ([place])   . Tricuspid regurgitation    a. mild-mod TR by echo 05/2016    Past Surgical History:  Procedure Laterality Date  . COLONOSCOPY  2002  . COLONOSCOPY  01/19/2012   Procedure: COLONOSCOPY;  Surgeon: Rogene Houston, MD;  Location[facility];  Service: Endoscopy;   Laterality: N/A;  1030  . LIPOMA EXCISION  1980  . ORIF ANKLE FRACTURE  2000   Right  . PROSTATE SURGERY  12/2011  . ROTATOR CUFF REPAIR     Right  . TEE WITHOUT CARDIOVERSION N/A 06/17/2016   Procedure: TRANSESOPHAGEAL ECHOCARDIOGRAM (TEE);  Surgeon: Jerline Pain, MD;  Location: [facility];  Service: Cardiovascular;  Laterality: N/A;  . TRANSURETHRAL RESECTION OF PROSTATE  09/2011  . URETHRAL STRICTURE DILATATION  1980s    Family History  Problem Relation Age of Onset  . Stroke Mother   . Diabetes Father   . Colon cancer Neg Hx     Social History   Social History  . Marital status: Married    Spouse name: N/A  . Number of children: 1  . Years of education: N/A   Occupational History  . Retired    Social History Main Topics  . Smoking status: Former Smoker    Packs/day: 1.00    Years: 20.00    Types: Cigarettes    Start date: 07/04/1950    Quit date: 04/25/1992  . Smokeless tobacco: Current User    Types: Chew  . Alcohol use No  . Drug use: No  . Sexual activity: Not Currently   Other Topics Concern  . Not on file   Social History Narrative  . No narrative on file    Prior to Admission medications   Medication Sig Start Date End Date Taking? Authorizing Provider  albuterol (PROVENTIL HFA;VENTOLIN HFA) 108 (90 Base) MCG/ACT inhaler Inhale 1-2 puffs into the lungs 5 (five) times daily as needed for wheezing or shortness of breath. 05/28/16  Yes Nat Christen, MD  furosemide (LASIX) 20 MG tablet Take 2 tablets (40 mg total) by mouth daily. 06/22/16 09/20/16 Yes Arnoldo Lenis, MD  HYDROcodone-acetaminophen (NORCO/VICODIN) 5-325 MG tablet Take 1 tablet by mouth 3 (three) times daily as needed for moderate pain. 05/05/16  Yes Kathyrn Drown, MD  metFORMIN (GLUCOPHAGE) 500 MG tablet 1/2 tablet twice a day 06/19/16  Yes Jettie Booze, MD  metoprolol succinate (TOPROL-XL) 25 MG 24 hr tablet Take 0.5 tablets (12.5 mg total) by mouth daily. 06/22/16  Yes Arnoldo Lenis,  MD  nortriptyline (PAMELOR) 10 MG capsule TAKE 1 TO 2 CAPSULES AT BEDTIME FOR BURNING IN FEET. 03/29/16  Yes Kathyrn Drown, MD  traZODone (DESYREL) 100 MG tablet Take 1 tablet (100 mg total) by mouth at bedtime. 02/05/16  Yes Nilda Simmer, NP  warfarin (COUMADIN) 5 MG tablet Take 1 tablet (5 mg total) by mouth daily. TAKE AS DIRECTED BY PHYSICIAN Patient taking differently: Take 2.5-5 mg by mouth See admin instructions. TAKE 5mg  daily except take 2.5mg  (1/2 tab) on Tuesday, Thursday and Saturday. 02/05/16  Yes Nilda Simmer, NP    Current Facility-Administered Medications  Medication Dose Route Frequency Provider Last Rate Last Dose  . 0.9 %  sodium chloride infusion  250 mL Intravenous PRN Roney Jaffe, MD      . acetaminophen (TYLENOL) tablet 650 mg  650 mg Oral Q6H PRN Roney Jaffe, MD       Or  . acetaminophen (TYLENOL) suppository 650 mg  650 mg Rectal Q6H PRN Roney Jaffe, MD      . amiodarone (NEXTERONE PREMIX) 360-4.14 MG/200ML-% (1.8 mg/mL) IV infusion  30 mg/hr Intravenous Continuous Quillian Quince  R Bensimhon, MD      . antiseptic oral rinse (CPC / CETYLPYRIDINIUM CHLORIDE 0.05%) solution 7 mL  7 mL Mouth Rinse BID Satira Mccallum Tillery, PA-C      . bisacodyl (DULCOLAX) suppository 10 mg  10 mg Rectal Daily PRN Roney Jaffe, MD   10 mg at 06/30/16 0834  . furosemide (LASIX) injection 80 mg  80 mg Intravenous BID Jolaine Artist, MD   80 mg at 06/30/16 1759  . heparin ADULT infusion 100 units/mL (25000 units/263mL sodium chloride 0.45%)  1,400 Units/hr Intravenous Continuous Otilio Miu, RPH 14 mL/hr at 06/30/16 1757 1,400 Units/hr at 06/30/16 1757  . HYDROcodone-acetaminophen (NORCO/VICODIN) 5-325 MG per tablet 1 tablet  1 tablet Oral TID PRN Roney Jaffe, MD      . insulin aspart (novoLOG) injection 0-9 Units  0-9 Units Subcutaneous TID WC Roney Jaffe, MD   1 Units at 06/30/16 1759  . milrinone (PRIMACOR) 20 MG/100 ML (0.2 mg/mL) infusion  0.125 mcg/kg/min  Intravenous Continuous Shaune Pascal Bensimhon, MD 3.6 mL/hr at 06/30/16 1013 0.125 mcg/kg/min at 06/30/16 1013  . nortriptyline (PAMELOR) capsule 10 mg  10 mg Oral QHS PRN Roney Jaffe, MD      . ondansetron [facility]) tablet 4 mg  4 mg Oral Q6H PRN Roney Jaffe, MD       Or  . ondansetron [facility]) injection 4 mg  4 mg Intravenous Q6H PRN Roney Jaffe, MD      . potassium chloride SA (K-DUR,KLOR-CON) CR tablet 40 mEq  40 mEq Oral Q4H Shirley Friar, PA-C   40 mEq at 06/30/16 1218  . potassium chloride SA (K-DUR,KLOR-CON) CR tablet 40 mEq  40 mEq Oral BID Satira Mccallum Tillery, PA-C      . sodium chloride flush (NS) 0.9 % injection 10-40 mL  10-40 mL Intracatheter Q12H Shirley Friar, PA-C      . sodium chloride flush (NS) 0.9 % injection 10-40 mL  10-40 mL Intracatheter PRN Satira Mccallum Tillery, PA-C      . sodium chloride flush (NS) 0.9 % injection 3 mL  3 mL Intravenous Q12H Roney Jaffe, MD   3 mL at 06/30/16 1000  . sodium chloride flush (NS) 0.9 % injection 3 mL  3 mL Intravenous PRN Roney Jaffe, MD      . traZODone (DESYREL) tablet 100 mg  100 mg Oral QHS Roney Jaffe, MD   100 mg at 06/29/16 2214    Allergies  Allergen Reactions  . Ambien [Zolpidem Tartrate] Other (See Comments)    Sleep walks  . Cholestatin   . Lipitor [Atorvastatin Calcium] Other (See Comments)    myalgias  . Ranitidine Other (See Comments)    Chest discomfort  . Simvastatin Other (See Comments)    Myalgias  . Xanax Xr [Alprazolam Er]     Tightness in chest      Review of Systems:   General:  decreased appetite, decreased energy, no weight gain, + weight loss, no fever  Cardiac:  no chest pain with exertion, no chest pain at rest, +SOB with exertion, + intermittent resting SOB, + PND, + orthopnea, no palpitations, + arrhythmia, + atrial fibrillation, + LE edema, + dizzy spells, + near syncope  Respiratory:  + shortness of breath, no home oxygen, intermittent productive cough, +  chronic dry cough, no bronchitis, no wheezing, no hemoptysis, no asthma, no pain with inspiration or cough, no sleep apnea, no CPAP at night  GI:   no difficulty swallowing, no  reflux, no frequent heartburn, no hiatal hernia, no abdominal pain, + constipation, no diarrhea, no hematochezia, no hematemesis, no melena  GU:   no dysuria,  no frequency, no urinary tract infection, no hematuria, + enlarged prostate, no kidney stones, no kidney disease  Vascular:  no pain suggestive of claudication, + pain in feet, no leg cramps, no varicose veins, no DVT, no non-healing foot ulcer  Neuro:   + reported h/o stroke noted on previous imaging studies w/ no clinical events, + reported TIA in remote past, no seizures, no headaches, + temporary blindness one eye,  no slurred speech, + peripheral neuropathy, mild chronic pain, + instability of gait, no memory/cognitive dysfunction  Musculoskeletal: + arthritis - primarily involving the lower back and kness, no joint swelling, no myalgias, some difficulty walking, decreased mobility   Skin:   no rash, no itching, no skin infections, no pressure sores or ulcerations  Psych:   no anxiety, no depression, no nervousness, no unusual recent stress  Eyes:   + blurry vision, no floaters, + recent vision changes  ENT:   no hearing loss, no loose or painful teeth, full set dentures  Hematologic:  + easy bruising, + abnormal bleeding, no clotting disorder, no frequent epistaxis  Endocrine:  + diabetes, does not check CBG's at home     Physical Exam:   BP 129/61   Pulse (!) 103   Temp 97.4 F (36.3 C) (Axillary)   Resp (!) 28   Ht 6\' 1"  (1.854 m)   Wt 210 lb 12.2 oz (95.6 kg)   SpO2 97%   BMI 27.81 kg/m   General:  Elderly male in NAD but notably tachypneic with speech   HEENT:  Unremarkable   Neck:   no JVD, + bilateral bruits, no adenopathy   Chest:   clear to auscultation, symmetrical breath sounds, no wheezes, no rhonchi   CV:   Irregular rate and rhythm,  grade III-IV/VI systolic murmur best LLSB  Abdomen:  soft, non-tender, no masses   Extremities:  warm, well-perfused, pulses diminished, trace lower extremity edema  Rectal/GU  Deferred  Neuro:   Grossly non-focal and symmetrical throughout  Skin:   Clean and dry, chronic hemosiderosis and bruising consistent with long-term warfarin therapy , no breakdown   Diagnostic Tests:  Transthoracic Echocardiography  Patient:  Konnar, Belli MR #:    [id number] Study Date: 04/12/2012 Gender:   M Age:    12 Height:   185.4cm Weight:   114.3kg BSA:    2.106m^2 Pt. Status: Room:  PERFORMING  Aneta Mins [place] SONOGRAPHER Essie Christine cc:  ------------------------------------------------------------ LV EF: 50% -  55%  ------------------------------------------------------------ Indications:   Dyspnea 786.09.  ------------------------------------------------------------ History:  PMH:  Atrial fibrillation. Aortic valve disease. PMH: Hyperlipidemia Risk factors: Hypertension. Diabetes mellitus.  ------------------------------------------------------------ Study Conclusions  - Left ventricle: The cavity size was normal. There was mild focal basal hypertrophy of the septum. Systolic function was low normal. The estimated ejection fraction was in the range of 50% to 55%. Although no diagnostic regional wall motion abnormality was identified, this possibility cannot be completely excluded on the basis of this study - cannot exclude inferior hypokinesis. The study is not technically sufficient to allow evaluation of LV diastolic function. - Aortic valve: Mildly calcified annulus. Trileaflet; moderately calcified leaflets. Cusp separation was reduced. There was mild stenosis. Trivial regurgitation. Mean gradient: 25mm Hg (S). Valve area: 1.16cm^2 (Vmax). - Mitral valve: Calcified annulus. Mildly thickened leaflets . Mild  regurgitation. - Left  atrium: The atrium was moderately dilated. - Atrial septum: There was increased thickness of the septum, consistent with lipomatous hypertrophy. - Tricuspid valve: Trivial regurgitation. - Pericardium, extracardiac: There was no pericardial effusion. Transthoracic echocardiography. M-mode, complete 2D, spectral Doppler, and color Doppler. Height: Height: 185.4cm. Height: 73in. Weight: Weight: 114.3kg. Weight: 251.5lb. Body mass index: BMI: 33.2kg/m^2. Body surface area:  BSA: 2.6m^2. Patient status: Outpatient. Location: Echo laboratory.  ------------------------------------------------------------  ------------------------------------------------------------ Left ventricle: The cavity size was normal. There was mild focal basal hypertrophy of the septum. Systolic function was low normal. The estimated ejection fraction was in the range of 50% to 55%. Although no diagnostic regional wall motion abnormality was identified, this possibility cannot be completely excluded on the basis of this study - cannot exclude inferior hypokinesis. The study is not technically sufficient to allow evaluation of LV diastolic function.  ------------------------------------------------------------ Aortic valve:  Mildly calcified annulus. Trileaflet; moderately calcified leaflets. Cusp separation was reduced. Doppler:  There was mild stenosis.  Trivial regurgitation.  Peak velocity ratio of LVOT to aortic valve: 0.31. Valve area: 1.16cm^2 (Vmax). Indexed valve area: 0.49cm^2/m^2 (Vmax).  Mean gradient: 28mm Hg (S). Peak gradient: 85mm Hg (S).  ------------------------------------------------------------ Aorta: Aortic root: The aortic root was normal in size.  ------------------------------------------------------------ Mitral valve:  Calcified annulus. Mildly thickened leaflets . Doppler:  Mild  regurgitation.  ------------------------------------------------------------ Left atrium: The atrium was moderately dilated.  ------------------------------------------------------------ Atrial septum: There was increased thickness of the septum, consistent with lipomatous hypertrophy.  ------------------------------------------------------------ Right ventricle: The cavity size was normal. Systolic function was normal.  ------------------------------------------------------------ Pulmonic valve:  The valve appears to be grossly normal. Doppler:  No significant regurgitation.  ------------------------------------------------------------ Tricuspid valve:  The valve appears to be grossly normal. Doppler:  Trivial regurgitation.  ------------------------------------------------------------ Pulmonary artery:  Systolic pressure could not be accurately estimated.  ------------------------------------------------------------ Right atrium: The atrium was normal in size.  ------------------------------------------------------------ Pericardium: There was no pericardial effusion.  ------------------------------------------------------------ Systemic veins: Inferior vena cava: The vessel was normal in size; the respirophasic diameter changes were in the normal range (= 50%); findings are consistent with normal central venous pressure.  ------------------------------------------------------------  2D measurements    Normal Doppler measurements  Norma Left ventricle                    l LVID ED,  47.1 mm   43-52  LVOT chord,             Peak vel, 80. cm/s   ----- PLAX              S      9 LVID ES,  36.8 mm   23-38  Aortic valve chord,             Peak vel, 265 cm/s   ----- PLAX              S FS, chord,  22 %   >29   Mean vel, 204 cm/s   ----- PLAX               S LVPW, ED  10.5 mm   ------ VTI, S  51. cm    ----- IVS/LVPW  1.34    <1.3        5 ratio, ED           Mean    18 mm Hg  ----- Ventricular septum       gradient, IVS, ED  14.1 mm   ------ S LVOT  Peak    28 mm Hg  ----- Diam, S   22 mm   ------ gradient, Area    3.8 cm^2  ------ S Aorta             Peak vel 0.3     ----- Root diam,  35 mm   ------ ratio,   1 ED               LVOT/AV Left atrium          Area,   1.1 cm^2   ----- AP dim    50 mm   ------ Vmax    6 AP dim   2.11 cm/m^2 <2.2  Area   0.4 cm^2/m^2 ----- index             index    9 Right ventricle        (Vmax) RVID ED,   30 mm   19-38 PLAX  ------------------------------------------------------------ Prepared and Electronically Authenticated by  Rozann Lesches, MD 2013-05-30T09:47:[id number]    Transthoracic Echocardiography  (Report amended )  Patient:    Markevious, Zaloudek MR #:       [id number] Study Date: 06/01/2016 Gender:     M Age:        33 Height:     185.4 cm Weight:     105.7 kg BSA:        2.36 m^2 Pt. Status: Room:   ATTENDING    Kerry Hough, M.D.  Berna Spare, M.D.  REFERRING    Kerry Hough, M.D.  PERFORMING   [facility] [place]  SONOGRAPHER  Jason Coop, RCS  cc:  ------------------------------------------------------------------- LV EF: 35% -   40%  ------------------------------------------------------------------- History:   PMH:   Dyspnea.  Atrial fibrillation.  Coronary artery disease.  Aortic valve disease.  Risk factors:  Former tobacco use. Hypertension. Diabetes mellitus. Dyslipidemia.  ------------------------------------------------------------------- Study Conclusions  - Left ventricle: The cavity size was normal. Wall thickness was   normal.  Systolic function was moderately reduced. The estimated   ejection fraction was in the range of 35% to 40%. Diffuse   hypokinesis. - Aortic valve: Mildly to moderately calcified annulus. Moderately   thickened leaflets. There was moderate to severe stenosis.   Moderate gradient, severe AVA VTI likely due to decreased cardiac   output in setting of systolic dysfunction and significant mitral   regurgitation. There was mild regurgitation. VTI ratio of LVOT to   aortic valve: 0.16. Valve area (VTI): 0.6 cm^2. Valve area   (Vmax): 0.77 cm^2. Valve area (Vmean): 0.73 cm^2. - Mitral valve: Mildly to moderately calcified annulus. Moderately   thickened leaflets . There was moderate to severe regurgitation.   Valve area by continuity equation (using LVOT flow): 1.5 cm^2. - Left atrium: The atrium was severely dilated. - Right ventricle: Systolic function was mildly to moderately   reduced. - Right atrium: The atrium was mildly dilated. - Atrial septum: No defect or patent foramen ovale was identified. - Tricuspid valve: There was mild-moderate regurgitation. - Pulmonary arteries: Systolic pressure was moderately increased.   PA peak pressure: 54 mm Hg (S).  ------------------------------------------------------------------- Study data:  Comparison was made to the study of 04/12/2012.  Study status:  Routine.  Procedure:  The patient reported no pain pre or post test. Transthoracic echocardiography for left ventricular function evaluation, for right ventricular function evaluation, and for assessment of valvular function. Image quality was adequate. Study completion:  There were no complications. Transthoracic  echocardiography.  M-mode, complete 2D, spectral Doppler, and color Doppler.  Birthdate:  Patient birthdate: 25-Sep-1932.  Age:  Patient is 82 yr old.  Sex:  Gender: male. BMI: 30.7 kg/m^2.  Blood pressure:     110/64  Patient status: Outpatient.  Study date:  Study date: 06/01/2016.  Study time: 11:57 AM.  -------------------------------------------------------------------  ------------------------------------------------------------------- Left ventricle:  The cavity size was normal. Wall thickness was normal. Systolic function was moderately reduced. The estimated ejection fraction was in the range of 35% to 40%. Diffuse hypokinesis. The study was not technically sufficient to allow evaluation of LV diastolic dysfunction due to atrial fibrillation.   ------------------------------------------------------------------- Aortic valve:   Mildly to moderately calcified annulus. Moderately thickened leaflets. Uncertain number of leaflets.  Doppler:   There was moderate to severe stenosis.   Moderate gradient, severe AVA VTI likely due to decreased cardiac output in setting of systolic dysfunction and significant mitral regurgitation. There was mild regurgitation.    VTI ratio of LVOT to aortic valve: 0.16. Valve area (VTI): 0.6 cm^2. Indexed valve area (VTI): 0.25 cm^2/m^2. Peak velocity ratio of LVOT to aortic valve: 0.2. Valve area (Vmax): 0.77 cm^2. Indexed valve area (Vmax): 0.33 cm^2/m^2. Mean velocity ratio of LVOT to aortic valve: 0.19. Valve area (Vmean): 0.73 cm^2. Indexed valve area (Vmean): 0.31 cm^2/m^2.    Mean gradient (S): 17 mm Hg. Peak gradient (S): 27 mm Hg.  ------------------------------------------------------------------- Aorta:  Aortic root: The aortic root was normal in size.  ------------------------------------------------------------------- Mitral valve:   Mildly to moderately calcified annulus. Moderately thickened leaflets .  Doppler:   There was no evidence for stenosis.   There was moderate to severe regurgitation. The MR vena contracta is 0.6 cm. The MR is eccentric and may be underestimated.    Valve area by continuity equation (using LVOT flow): 1.5 cm^2. Indexed valve area by continuity equation (using LVOT flow):  0.64 cm^2/m^2.    Mean gradient (D): 2 mm Hg. Peak gradient (D): 9 mm Hg.  ------------------------------------------------------------------- Left atrium:  The atrium was severely dilated.  ------------------------------------------------------------------- Atrial septum:  No defect or patent foramen ovale was identified.   ------------------------------------------------------------------- Right ventricle:  The cavity size was normal. Systolic function was mildly to moderately reduced.  ------------------------------------------------------------------- Pulmonic valve:   Not well visualized.  Doppler:   There was no evidence for stenosis.   There was no significant regurgitation.   ------------------------------------------------------------------- Tricuspid valve:   Normal thickness leaflets.  Doppler:   There was no evidence for stenosis.   There was mild-moderate regurgitation. There is some suggestion of systolic hepatic vein flow reversal, suggesting TR may be more severe.  ------------------------------------------------------------------- Pulmonary artery:   Systolic pressure was moderately increased.  ------------------------------------------------------------------- Right atrium:  The atrium was mildly dilated.  ------------------------------------------------------------------- Pericardium:  There was no pericardial effusion.  ------------------------------------------------------------------- Systemic veins:  IVC is dilated with normal respiratory variation,estimated RA pressure is 57mmHg.  ------------------------------------------------------------------- Measurements   Left ventricle                            Value          Reference  LV ID, ED, PLAX chordal           (H)     58.6  mm       43 - 52  LV ID, ES, PLAX chordal           (H)     52.3  mm  23 - 38  LV fx shortening, PLAX chordal    (L)     11    %        >=29  LV PW thickness, ED                        7.77  mm       ---------  IVS/LV PW ratio, ED                       1.11           <=1.3  Stroke volume, 2D                         34    ml       ---------  Stroke volume/bsa, 2D                     14    ml/m^2   ---------  LV ejection fraction, 1-p A4C             39    %        ---------  LV end-diastolic volume, 2-p              120   ml       ---------  LV end-systolic volume, 2-p               87    ml       ---------  LV ejection fraction, 2-p                 27    %        ---------  Stroke volume, 2-p                        33    ml       ---------  LV end-diastolic volume/bsa, 2-p          51    ml/m^2   ---------  LV end-systolic volume/bsa, 2-p           37    ml/m^2   ---------  Stroke volume/bsa, 2-p                    14    ml/m^2   ---------  LV filling time, D, DP                    380   ms       ---------    Ventricular septum                        Value          Reference  IVS thickness, ED                         8.6   mm       ---------    LVOT                                      Value          Reference  LVOT ID, S  22    mm       ---------  LVOT area                                 3.8   cm^2     ---------  LVOT ID                                   22    mm       ---------  LVOT peak velocity, S                     53.2  cm/s     ---------  LVOT mean velocity, S                     36.4  cm/s     ---------  LVOT VTI, S                               9.02  cm       ---------  Stroke volume (SV), LVOT DP               34.3  ml       ---------  Stroke index (SV/bsa), LVOT DP            14.5  ml/m^2   ---------    Aortic valve                              Value          Reference  Aortic valve peak velocity, S             262   cm/s     ---------  Aortic valve mean velocity, S             190   cm/s     ---------  Aortic valve VTI, S                       57.3  cm       ---------  Aortic mean gradient, S                    17    mm Hg    ---------  Aortic peak gradient, S                   27    mm Hg    ---------  VTI ratio, LVOT/AV                        0.16           ---------  Aortic valve area, VTI                    0.6   cm^2     ---------  Aortic valve area/bsa, VTI                0.25  cm^2/m^2 ---------  Velocity ratio, peak, LVOT/AV             0.2            ---------  Aortic valve  area, peak velocity          0.77  cm^2     ---------  Aortic valve area/bsa, peak               0.33  cm^2/m^2 ---------  velocity  Velocity ratio, mean, LVOT/AV             0.19           ---------  Aortic valve area, mean velocity          0.73  cm^2     ---------  Aortic valve area/bsa, mean               0.31  cm^2/m^2 ---------  velocity    Aorta                                     Value          Reference  Aortic root ID, ED                        34    mm       ---------    Left atrium                               Value          Reference  LA ID, A-P, ES                            44    mm       ---------  LA ID/bsa, A-P                            1.87  cm/m^2   <=2.2  LA volume/bsa, S                          69.5  ml/m^2   ---------  LA volume/bsa, ES, 1-p A4C                75.9  ml/m^2   ---------    Mitral valve                              Value          Reference  Mitral E-wave peak velocity               148   cm/s     ---------  Mitral mean velocity, D                   58.87 cm/s     ---------  Mitral deceleration time          (L)     103   ms       150 - 230  Mitral mean gradient, D                   2     mm Hg    ---------  Mitral peak gradient, D                   9     mm Hg    ---------  Mitral valve area, LVOT                   1.5   cm^2     ---------  continuity  Mitral valve area/bsa, LVOT               0.64  cm^2/m^2 ---------  continuity  Mitral regurg VTI, PISA                   156   cm       ---------  Mitral ERO, PISA                          0.69  cm^2     ---------   Mitral regurg volume, PISA                108   ml       ---------  Mitral regurg fraction, PISA              75.9  %        ---------    Pulmonary arteries                        Value          Reference  PA pressure, S, DP                (H)     54    mm Hg    <=30    Tricuspid valve                           Value          Reference  Tricuspid regurg peak velocity            316   cm/s     ---------  Tricuspid peak RV-RA gradient             40    mm Hg    ---------    Right ventricle                           Value          Reference  TAPSE                                     11.8  mm       ---------  RV s&', lateral, S                         7     cm/s     ---------    Pulmonic valve                            Value          Reference  Pulmonic valve peak velocity, S           38.8  cm/s     ---------  Legend: (L)  and  (H)  mark values outside specified reference range.  ------------------------------------------------------------------- June Leap, M.D. 2017-07-19T16:04:36    Transesophageal Echocardiography  Patient:    Hossam, Deflorio MR #:       [id number] Study  Date: 06/17/2016 Gender:     M Age:        39 Height:     185.4 cm Weight:     98 kg BSA:        2.26 m^2 Pt. Status: Room:   PERFORMING   Candee Furbish, M.D.  ATTENDING    Dunn, Gaston Islam, [place], [place], Jayadeep S  SONOGRAPHER  Jimmy Reel, RDCS  cc:  ------------------------------------------------------------------- LV EF: 30% -   35%  ------------------------------------------------------------------- Indications:      Aortic stenosis 424.1.  Mitral regurgitation 424.0.  ------------------------------------------------------------------- Study Conclusions  - Left ventricle: Systolic function was moderately to severely   reduced. The estimated ejection fraction was in the range of 30%   to 35%. Diffuse  hypokinesis. - Aortic valve: Cusp separation was severely reduced with fusion of   left and non coronary cusps. There was severe stenosis. There was   mild regurgitation. Dimensionless index (VTI) is 0.2 (<0.25 is   severe). Peak and mean velocity and gradients are underestimated   by decreased EF/ CO. Valve area (VTI): 0.7 cm^2. Valve area   (Vmax): 0.78 cm^2. Valve area (Vmean): 0.75 cm^2. Planemtry is   0.84 cm squared. - Mitral valve: There was moderate to severe regurgitation directed   eccentrically and posteriorly. There is pulmonary flow systolic   blunting noted. - Left atrium: The atrium was dilated. No evidence of thrombus in   the atrial cavity or appendage. No evidence of thrombus in the   appendage. There was spontaneous echo contrast (&quot;smoke&quot;). - Right ventricle: Systolic function was mildly reduced. - Atrial septum: No defect or patent foramen ovale was identified. - Superior vena cava: The study excluded a thrombus.  ------------------------------------------------------------------- Study data:   Study status:  Routine.  Consent:  The risks, benefits, and alternatives to the procedure were explained to the patient and informed consent was obtained.  Procedure:  The patient reported no pain pre or post test. Initial setup. The patient was brought to the laboratory. Surface ECG leads were monitored. Sedation. Conscious sedation was administered by cardiology staff. Sedation was reversed at the end of the procedure. Transesophageal echocardiography. Topical anesthesia was obtained using viscous lidocaine. An adult multiplane transesophageal probe was inserted by the attending cardiologistwithout difficulty. Image quality was adequate.  Study completion:  The patient tolerated the procedure well. There were no complications.  Administered medications: Fentanyl, 65mcg, IV.  Midazolam, 4mg , IV.          Diagnostic transesophageal echocardiography.  2D and color  Doppler. Birthdate:  Patient birthdate: 1932/10/01.  Age:  Patient is 82 yr old.  Sex:  Gender: male.    BMI: 28.5 kg/m^2.  Blood pressure: 141/87  Patient status:  Outpatient.  Study date:  Study date: 06/17/2016. Study time: 08:20 AM.  Location:  Endoscopy.  -------------------------------------------------------------------  ------------------------------------------------------------------- Left ventricle:  Systolic function was moderately to severely reduced. The estimated ejection fraction was in the range of 30% to 35%. Diffuse hypokinesis.  ------------------------------------------------------------------- Aortic valve:   Severely thickened, severely calcified leaflets. Cusp separation was severely reduced with fusion of left and non coronary cusps. Dimensionless index (VTI) is 0.2 (<0.25 is severe). Peak and mean velocity and gradients are underestimated by decreased EF/ CO.  Doppler:   There was severe stenosis.   There was mild regurgitation.    VTI ratio of LVOT to aortic valve: 0.18. Valve  area (VTI): 0.7 cm^2. Indexed valve area (VTI): 0.31 cm^2/m^2. Peak velocity ratio of LVOT to aortic valve: 0.21. Valve area (Vmax): 0.78 cm^2. Indexed valve area (Vmax): 0.34 cm^2/m^2. Mean velocity ratio of LVOT to aortic valve: 0.2. Valve area (Vmean): 0.75 cm^2. Indexed valve area (Vmean): 0.33 cm^2/m^2. Mean gradient (S): 22 mm Hg. Peak gradient (S): 41 mm Hg.  ------------------------------------------------------------------- Aorta:  The aorta was not dilated and moderately calcified.  ------------------------------------------------------------------- Mitral valve:   Moderately thickened leaflets .  Doppler:  There was moderate to severe regurgitation directed eccentrically and posteriorly. PISA is inaccurate given multiple confounding factors. There is pulmonary flow systolic blunting noted. Regurgitant color Doppler signal encompasses 40% of LA  size.  ------------------------------------------------------------------- Left atrium:  The atrium was dilated.  No evidence of thrombus in the atrial cavity or appendage.  No evidence of thrombus in the appendage. There was spontaneous echo contrast (&quot;smoke&quot;). The appendage was of normal size. Emptying velocity was normal.  ------------------------------------------------------------------- Atrial septum:  No defect or patent foramen ovale was identified.   ------------------------------------------------------------------- Right ventricle:  The cavity size was normal. Systolic function was mildly reduced.  ------------------------------------------------------------------- Pulmonic valve:    Structurally normal valve.   Cusp separation was normal.  Doppler:  There was mild regurgitation.  ------------------------------------------------------------------- Tricuspid valve:   Structurally normal valve.   Leaflet separation was normal.  No evidence of vegetation.  Doppler:  There was no regurgitation.  ------------------------------------------------------------------- Pulmonary artery:   The main pulmonary artery was normal-sized.  ------------------------------------------------------------------- Right atrium:  The atrium was normal in size.  No evidence of thrombus in the atrial cavity or appendage.  ------------------------------------------------------------------- Pericardium:  The pericardium was normal in appearance. There was no pericardial effusion.  ------------------------------------------------------------------- Systemic veins: Superior vena cava: The study excluded a thrombus.  ------------------------------------------------------------------- Post procedure conclusions Ascending Aorta:  - The aorta was not dilated and moderately calcified.  ------------------------------------------------------------------- Measurements   Left  ventricle                           Value  Stroke volume, 2D                        51    ml  Stroke volume/bsa, 2D                    23    ml/m^2    LVOT                                     Value  LVOT ID, S                               22    mm  LVOT area                                3.8   cm^2  LVOT peak velocity, S                    65.7  cm/s  LVOT mean velocity, S                    41.5  cm/s  LVOT VTI, S  13.5  cm    Aortic valve                             Value  Aortic valve peak velocity, S            319   cm/s  Aortic valve mean velocity, S            211   cm/s  Aortic valve VTI, S                      73.6  cm  Aortic mean gradient, S                  22    mm Hg  Aortic peak gradient, S                  41    mm Hg  VTI ratio, LVOT/AV                       0.18  Aortic valve area, VTI                   0.7   cm^2  Aortic valve area/bsa, VTI               0.31  cm^2/m^2  Velocity ratio, peak, LVOT/AV            0.21  Aortic valve area, peak velocity         0.78  cm^2  Aortic valve area/bsa, peak velocity     0.34  cm^2/m^2  Velocity ratio, mean, LVOT/AV            0.2  Aortic valve area, mean velocity         0.75  cm^2  Aortic valve area/bsa, mean velocity     0.33  cm^2/m^2    Mitral valve                             Value  Mitral regurg VTI, PISA                  191   cm  Mitral ERO, PISA                         0.2   cm^2  Mitral regurg volume, PISA               38    ml  Legend: (L)  and  (H)  mark values outside specified reference range.  ------------------------------------------------------------------- Prepared and Electronically Authenticated by  Candee Furbish, M.D. 2017-08-04T10:16:21    Procedures   Right Heart Cath and Coronary Angiography  Conclusion     Ost Cx to Prox Cx lesion, 50 %stenosed.  Mid Cx to Dist Cx lesion, 80 %stenosed.  Mid RCA lesion, 40 %stenosed.  Mid LAD lesion, 75  %stenosed.  2nd Diag lesion, 75 %stenosed.  Hemodynamic findings consistent with moderate pulmonary hypertension.  Unable to cross the aortic valve.  PA sat 52%. CO 3.2 L/min.   Continue with plans for CT surgery evaluation, for valve disease and two vessel CAD.   Indications   Aortic stenosis [I35.0 (ICD-10-CM)]  Procedural Details/Technique   Technical Details The risks, benefits, and details of the procedure were explained to the patient. The patient verbalized  understanding and wanted to proceed. Informed written consent was obtained.  PROCEDURE TECHNIQUE: After Xylocaine anesthesia, a 7 French sheath was placed in the right common femoral vein. A 7 French balloontipped Swan-Ganz catheter was advanced to the pulmonary artery under fluoroscopic guidance. Hemodynamic pressures were obtained. Oxygen saturations were obtained. After Xylocaine anesthesia, a 62F sheath was placed in the right radial artery with a single anterior needle wall stick. Left coronary angiography was done using a Judkins L3.5 guide catheter. Right coronary angiography was done using a Judkins R4 guide catheter. Left heart cath was not done due to inability to cross the aortic valve despite use of a straight wire.     Contrast: 50 cc  The patient had been sedated for TEE earlier so additional sedation was not given.   Estimated blood loss <50 mL. .    Complications   Complications documented before study signed (06/17/2016 11:19 AM EDT)    No complications were associated with this study.  Documented by Jettie Booze, MD - 06/17/2016 11:18 AM EDT    Coronary Findings   Dominance: Right  Left Anterior Descending  Mid LAD lesion, 75% stenosed. The lesion is type C and located at the major branch.  Second Diagonal Branch  Vessel is small in size.  2nd Diag lesion, 75% stenosed.  Third Diagonal Branch  Vessel is small in size.  Ramus Intermedius  Vessel is small.  Left Circumflex  Ost Cx to Prox  Cx lesion, 50% stenosed.  Mid Cx to Dist Cx lesion, 80% stenosed.  Right Coronary Artery  Vessel is large. There is mild the vessel.  Mid RCA lesion, 40% stenosed. The lesion is irregular.  Right Heart   Right Heart Pressures Hemodynamic findings consistent with moderate pulmonary hypertension.    Coronary Diagrams   Diagnostic Diagram     Implants     No implant documentation for this case.  PACS Images   Show images for Cardiac catheterization   Link to Procedure Log   Procedure Log    Hemo Data   Flowsheet Row Most Recent Value  Fick Cardiac Output 3.25 L/min  Fick Cardiac Output Index 1.46 (L/min)/BSA  RA A Wave -99 mmHg  RA V Wave 13 mmHg  RA Mean 11 mmHg  RV Systolic Pressure 48 mmHg  RV Diastolic Pressure 3 mmHg  RV EDP 9 mmHg  PA Systolic Pressure 52 mmHg  PA Diastolic Pressure 23 mmHg  PA Mean 37 mmHg  PW A Wave -99 mmHg  PW V Wave 41 mmHg  PW Mean 25 mmHg  AO Systolic Pressure [id number] mmHg  AO Diastolic Pressure 60 mmHg  AO Mean 87 mmHg  QP/QS 1  TPVR Index 25.3 HRUI  TSVR Index 59.5 HRUI  PVR SVR Ratio 0.16  TPVR/TSVR Ratio 0.43  Order-Level Documents:   There are no order-level documents.  Encounter-Level Documents - 06/17/2016:   Scan on 06/19/2016 11:13 AM by Provider Default, MD  Scan on 06/19/2016 10:47 AM by Provider Default, MD  Document on 06/17/2016 1:23 PM by Ashley Murrain, RN : AVS  Document on 06/17/2016 1:23 PM by Ashley Murrain, RN : After Visit Summary  Document on 06/17/2016 1:23 PM by Ashley Murrain, RN : IP After Visit Summary  Scan on 06/17/2016 11:05 AM by Provider Default, MD  Electronic signature on 06/17/2016 6:25 AM      Signed   Electronically signed by Jettie Booze, MD on 06/17/16 at 1119 EDT    CAROTID  DUPLEX SCAN   D.O.B: Technologist: Prior Exam: Date: Male Referring Dr. Joylene Igo: [id number] MRN FIN Accession # [facility] Ext[facility]ranial Examination Admit: RIGHT (S) (D)  Percent ECA DICA MICA PICA DCCA MCCA PCCA SUBCL 271 85 222 477 28 61 91 0 13 37  223 0 0 0 LEFT Percent ECA DICA MICA PICA DCCA MCCA PCCA SUBCL Carotid drawing is provided for reference only and should not be used for diagnosis ICA/CCA Ratio Vertebral Flow Blood Pressure antegrade 102 4.0 antegrade 102 0 0 6 52 30 20 104 86 63  254 193 114 (S) (D) 131 0 Max ICA Stenosis ICA/CCA Ratio 80-99 Max ICA Stenosis 40-59 17.0 Impressions Duplex imaging, with color Doppler, of the carotid arteries reveals heterogeneous plaque with shadowing in the ICA's ECA's and bifurcations, bilaterally. The RICA velocities are severely elevated with string flow in the proximal segment. The LICA velocities are elevated. The RECA velocities are elevated. The subclavian arteries are widely patent, with normal velocity flow. The vertebral arteries are patent with antegrade flow, bilaterally. Technologist Notes Plaque Plaque Examination Data cm/s cm/s Technically challenging due to neck girth, respirations and cardiac arrhythmia. Heterogeneous plaque with shadowing, bilaterally. 80-99% stenosis RICA, with string flow. [id number] LICA stenosis, upper end of scale. > 50% RECA stenosis. Normal subclavian arteries, bilaterally. Patent vertebral arteries with antegrade flow. Suggest Surgical consult Results sent to Dr Harl Bowie Observations: Risk Factors: In 6/13 at [facility], carotid Duplex showed right peak systolic velocity to be [id number] cm/s and end diastolic velocity to be 24 cm/s. The left peak systolic velocity was [id number] cm/s and end diastolic velocity was 32 cm/s. Patient complains of SOB and dizziness. Primary Indication: Follow up known disease Diabetes:Yes, Hyperlipidemia:Yes, Peripheral Vascular Disease:Yes,   STS Risk Calculator  Procedure    AVR + CABG  Risk of Mortality   14.8% Morbidity or Mortality  60.0% Prolonged LOS   29.1% Short LOS    9.5% Permanent  Stroke   5.8% Prolonged Vent Support  43.9% DSW Infection    0.7% Renal Failure    19.7% Reoperation    17.1%    Impression:  Patient has severe low gradient, low ejection fraction aortic stenosis with at least moderate to severe mitral regurgitation with multivessel coronary artery disease, chronic persistent atrial fibrillation, and acute on chronic combined systolic and diastolic congestive heart failure.  He presents with a slow progressive decline in his exercise tolerance over the past 4-5 years and a fairly precipitous decline over the past 6 months.  Over the past few weeks he has gone downhill very quickly and at present he is in decompensated class IV acute systolic heart failure with hemodynamic conditions consistent with early cardiogenic shock.   I have personally reviewed the patient's recent echocardiograms and diagnostic cardiac catheterization. The aortic valve is tricuspid with severe calcification, thickening, fibrosis, and restricted leaflet mobility involving all 3 leaflets. There is severe low gradient, low ejection fraction aortic stenosis. With the patient in atrial fibrillation the peak velocity across the aortic valve ranges between 2.6 and 3.1 m/s correspond to mean transvalvular gradient estimated 22 mmHg. The dimensionless velocity ratio is very low, measuring between 0.16 and 0.20. The left ventricle is moderately hypertrophied and moderately dilated. There is a combination of type I and type IIIB dysfunction of the mitral valve with moderate to severe mitral regurgitation. The mitral valve leaflets are relatively thin and restricted only during systole. There is moderate mitral annular dilatation with downward "tenting" of the anterior leaflet. There is severe  left atrial enlargement. The patient has moderate coronary artery disease with perhaps 75% stenosis of the mid left anterior descending coronary artery, 85% stenosis of the distal left circumflex coronary artery, and  otherwise moderate nonobstructive disease.   The patient's condition is further complicated by the presence of numerous comorbid medical problems including hypertension, chronic persistent atrial fibrillation, type II diabetes mellitus with peripheral neuropathy, history of subclinical stroke in the past and severe right internal carotid artery stenosis with ongoing symptoms of transient monocular visual disturbance involving the left eye which is not characteristic of amaurosis fugax but nonetheless worrisome for possible ongoing TIAs.  Risks associated with conventional surgical aortic valve replacement and coronary artery bypass grafting with or without mitral valve repair or replacement would be extremely high, likely much higher than that predicted using the STS risk calculator (which does not take into account the need for mitral valve repair or replacement or the presence of ongoing symptomatic cerebrovascular disease).  Under the current circumstances I would not consider this patient a candidate for conventional surgery.  It is conceivable that we could reconsider high risk conventional surgery if he improves dramatically with medical therapy and his symptomatic carotid disease can be successfully treated. However, it might be reasonable to consider transcatheter aortic valve replacement as an alternative if the patient's condition improves with aggressive medical therapy.  Based upon review of the patient's transesophageal echocardiogram it appears that the patient's underlying mitral regurgitation may be purely functional.  Moreover, the patient's coronary artery disease is not critical and may not be contributing to any significant degree to his current condition which I believe is primarily related to long-standing aortic stenosis.       Plan:  I discussed matters at length with the patient, his nephew, and his niece at the bedside this evening. I explained the multiple causes of the patient's  precipitous decline in functional status and development of acute decompensated congestive heart failure. We discussed the various components and my belief that the patient's aortic stenosis may be the principle driving force. The grave nature of these problems have been discussed as well as various options for management including long-term palliative medical therapy, extremely high risk conventional surgery, and transcatheter aortic valve replacement.   I agree with plans outlined by Dr. Haroldine Laws and colleagues to begin inotropic support using milrinone and aggressively resuscitate the patient. If he improves clinically, follow-up transthoracic echocardiogram and right heart catheterization may be considered some time next week. He will need vascular surgery consult because of the presence of possibly symptomatic critical right internal carotid artery stenosis. If he improves, CT angiography of the heart and aorta will need to be performed to assess the feasibility of transcatheter aortic valve replacement as an alternative to extremely high risk conventional surgery.   If he improves he will also need a formal physical therapy evaluation. We will continue to follow along intermittently.   I spent in excess of 120 minutes during the conduct of this hospital consultation and >50% of this time involved direct face-to-face encounter for counseling and/or coordination of the patient's care.    Valentina Gu. Roxy Manns, MD 06/30/2016 6:00 PM

## 2016-06-30 NOTE — Progress Notes (Signed)
Patient transferredt to [facility] accompanied by RN and Rapid response nurse via bed. Report given to Fraser Din, RN prior to transfer.

## 2016-06-30 NOTE — Progress Notes (Signed)
Initial Nutrition Assessment  DOCUMENTATION CODES:   Not applicable  INTERVENTION:  -RD to continue to monitor  NUTRITION DIAGNOSIS:   Inadequate oral intake related to poor appetite, chronic illness as evidenced by per patient/family report.  GOAL:   Patient will meet greater than or equal to 90% of their needs  MONITOR:   PO intake, I & O's, Labs, Weight trends, Supplement acceptance  REASON FOR ASSESSMENT:   Malnutrition Screening Tool   ASSESSMENT:    [name] [name] is a 82 y.o. male with history of CAF, HTN, HL, DM2, systolic CHF w reduced EF 35-40%, carotid stenosis and aortic valve stenosis.  Presenting with marked DOE , can't walk 10-15 feet and unable to lie flat last night to sleep.  Seen in ED by cardiologist and noted that wt's are down 15 lbs since last visit but still severe DOE  Spoke with [name] [name] at bedside. Rapid response was called for him this morning with afib and RVR, hr in 140s. Was transferred from [place] to [facility] He endorses being 258# a  Little over 2 months ago, and dropping to 215# upon this admission. After reviewing the chart, pt exhibits a 14#/6.25% severe wt loss in 1 month, but no evidence of weight being as high as 258# He exhibits DOE and Orthopnea Undergoing diuresis, likely cause for wt fluctuations demonstrated in chart. Pt states at home during time of wt loss, PO was very poor, appears it has improved documented PO intake 75-100% thus far. Had pancakes with sausage this morning he states he ate all of. Nutrition-Focused physical exam completed. Findings are no fat depletion, mild muscle depletion, and no edema.   Labs and Medications reviewed: IV Lasix 80mg  bid, KCL 66mEq TID Amiodarone drip, milrinone drip CBGs 207, K 3.1, Tot Bili 1.7  Diet Order:  Diet Carb Modified Fluid consistency: Thin; Room service appropriate? Yes  Skin:  Reviewed, no issues  Last BM:  PTA  Height:   Ht Readings from Last 1 Encounters:  06/30/16 6\' 1"   (1.854 m)    Weight:   Wt Readings from Last 1 Encounters:  06/30/16 210 lb 12.2 oz (95.6 kg)    Ideal Body Weight:  83.63 kg  BMI:  Body mass index is 27.81 kg/m.  Estimated Nutritional Needs:   Kcal:  1800-2100 calories  Protein:  95-115 grams  Fluid:  >/= 1.8L  EDUCATION NEEDS:   No education needs identified at this time  [name] Anis. Wisdom Rickey, MS, RD LDN Inpatient Clinical Dietitian Pager [phone]

## 2016-06-30 NOTE — Progress Notes (Signed)
Peripherally Inserted Central Catheter/Midline Placement  The IV Nurse has discussed with the patient and/or persons authorized to consent for the patient, the purpose of this procedure and the potential benefits and risks involved with this procedure.  The benefits include less needle sticks, lab draws from the catheter and patient may be discharged home with the catheter.  Risks include, but not limited to, infection, bleeding, blood clot (thrombus formation), and puncture of an artery; nerve damage and irregular heat beat.  Alternatives to this procedure were also discussed.  PICC/Midline Placement Documentation     Spoke to Hartley Barefoot- nephew per patient request-Ok that patient to sign   Synthia Innocent 06/30/2016, 3:35 PM

## 2016-06-30 NOTE — Progress Notes (Signed)
ANTICOAGULATION CONSULT NOTE - Initial Consult  Pharmacy Consult for Heparin Indication: atrial fibrillation  Allergies  Allergen Reactions  . Ambien [Zolpidem Tartrate] Other (See Comments)    Sleep walks  . Cholestatin   . Lipitor [Atorvastatin Calcium] Other (See Comments)    myalgias  . Ranitidine Other (See Comments)    Chest discomfort  . Simvastatin Other (See Comments)    Myalgias  . Xanax Xr [Alprazolam Er]     Tightness in chest    Patient Measurements: Height: 6\' 1"  (185.4 cm) Weight: 211 lb 3.2 oz (95.8 kg) IBW/kg (Calculated) : 79.9  Vital Signs: Temp: 97.5 F (36.4 C) (08/17 0415) Temp Source: Oral (08/17 0415) BP: 127/73 (08/17 0930) Pulse Rate: 95 (08/17 0930)  Labs:  Recent Labs  06/28/16 1316 06/29/16 0405 06/30/16 0404  HGB 15.2  --   --   HCT 44.6  --   --   PLT 266  --   --   LABPROT 19.8* 20.4* 18.5*  INR 1.66 1.72 1.53  CREATININE 1.10 1.16  --     Estimated Creatinine Clearance: 56.4 mL/min (by C-G formula based on SCr of 1.16 mg/dL).   Medical History: Past Medical History:  Diagnosis Date  . Aortic stenosis    a. mod-sev by echo 05/2016.  . Asthma   . Cerebrovascular disease 07/2010   TIA; carotid ultrasound in 07/2010-significant bilateral plaque without focal internal carotid artery stenosis; MRI -encephalomalacia left temporal and right temporal lobes; small inferior right cerebellar infarct; small vessel disease  . Chronic atrial fibrillation (HCC)    Paroxysmal; Echocardiogram in 2007-normal EF; mild LVH; left atrial enlargement; mild stenosis and minimal AI; negative stress nuclear study in 2008  . Chronic systolic CHF (congestive heart failure) ([place])    a. dx 05/2016 - EF 35-40%, diffuse HK, mod-severe AS, mod gradient, severe AVA VTI likely due to decreased cardiac output in setting of systolic dysfsunction and significant mitral regurgitation, mild MR, mod-severe MR, severe LAE, mild-mod RV dilation, mild RAE, mild-mod TR,  mod PASP 40mmHg.  . CKD (chronic kidney disease), stage II   . Degenerative joint disease    feet and legs  . Diabetes mellitus    no insulin; A1c of 6.6 in 2005  . Dizziness    occurs daily,especially in am  . Exertional dyspnea   . Gastroesophageal reflux disease   . Hepatic steatosis   . History of noncompliance with medical treatment   . Hyperlipidemia    adverse reactions to statins and niacin  . Hypertension    Borderline  . Irregular heartbeat   . Mitral regurgitation    a. mod-sev by echo 05/2016  . Peripheral vascular disease ([place])   . Renal insufficiency   . Temporal arteritis ([place])   . Tricuspid regurgitation    a. mild-mod TR by echo 05/2016   Assessment: 83yom on on coumadin for afib. Initially continued on admission - received 5mg  yesterday and INR 1.53 today. Patient is now in cardiogenic shock so will hold coumadin and start heparin. CBC from 8/15 wnl.   Goal of Therapy:  Heparin level 0.3-0.7 units/ml Monitor platelets by anticoagulation protocol: Yes   Plan:  1) Begin heparin at 1400 units/hr, no bolus 2) 8 hour heparin level 3) Daily heparin level and CBC  Deboraha Sprang 06/30/2016,9:38 AM

## 2016-06-30 NOTE — Progress Notes (Signed)
Advanced Heart Failure Rounding Note   Subjective:    Today he is SOB at rest and with exertion.   RHC/LHC  RA 11 PCWP 25  CO/CI 3.25/1.4   Ost Cx to Prox Cx lesion, 50 %stenosed.  Mid Cx to Dist Cx lesion, 80 %stenosed.  Mid RCA lesion, 40 %stenosed.  Mid LAD lesion, 75 %stenosed.  2nd Diag lesion, 75 %stenosed.  Hemodynamic findings consistent with moderate pulmonary hypertension.  Unable to cross the aortic valve.  PA sat 52%. CO 3.2 L/min.  Objective:   Weight Range:  Vital Signs:   Temp:  [97 F (36.1 C)-97.6 F (36.4 C)] 97.5 F (36.4 C) (08/17 0415) Pulse Rate:  [35-131] 49 (08/17 0415) Resp:  [18-43] 18 (08/17 0016) BP: (91-132)/(50-87) 101/78 (08/17 0415) SpO2:  [94 %-100 %] 100 % (08/17 0822) Weight:  [211 lb 3.2 oz (95.8 kg)-213 lb 1.6 oz (96.7 kg)] 211 lb 3.2 oz (95.8 kg) (08/17 0415)    Weight change: Filed Weights   06/28/16 2021 06/29/16 1700 06/30/16 0415  Weight: 215 lb 13.3 oz (97.9 kg) 213 lb 1.6 oz (96.7 kg) 211 lb 3.2 oz (95.8 kg)    Intake/Output:   Intake/Output Summary (Last 24 hours) at 06/30/16 0906 Last data filed at 06/30/16 0846  Gross per 24 hour  Intake             1542 ml  Output             1675 ml  Net             -133 ml     Physical Exam: General:  Dyspneic at rest. In bed HEENT: normal Neck: supple. JVP 6-7 . Carotids 2+ bilat; no bruits. No lymphadenopathy or thryomegaly appreciated. Cor: PMI nondisplaced. Irregular RVR ate & rhythm. No rubs, or murmurs. + S3  Lungs: Decreased in bases on 4 liters  Abdomen: soft, nontender, nondistended. No hepatosplenomegaly. No bruits or masses. Good bowel sounds. Extremities: no cyanosis, clubbing, rash,  R and LLE no edema with cool extrmities/ edema Neuro: alert & orientedx3, cranial nerves grossly intact. moves all 4 extremities w/o difficulty. Affect pleasant  Telemetry:  A fib 130-140s   Labs: Basic Metabolic Panel:  Recent Labs Lab 06/28/16 1316  06/29/16 0405  NA 137 137  K 3.1* 3.1*  CL 99* 97*  CO2 28 29  GLUCOSE 118* 148*  BUN 19 20  CREATININE 1.10 1.16  CALCIUM 9.3 9.1    Liver Function Tests:  Recent Labs Lab 06/28/16 1316  AST 24  ALT 27  ALKPHOS 72  BILITOT 1.7*  PROT 7.1  ALBUMIN 3.9   No results for input(s): LIPASE, AMYLASE in the last 168 hours. No results for input(s): AMMONIA in the last 168 hours.  CBC:  Recent Labs Lab 06/28/16 1316  WBC 6.8  NEUTROABS 5.1  HGB 15.2  HCT 44.6  MCV 89.6  PLT 266    Cardiac Enzymes: No results for input(s): CKTOTAL, CKMB, CKMBINDEX, TROPONINI in the last 168 hours.  BNP: BNP (last 3 results)  Recent Labs  05/03/16 1305 05/28/16 1617 06/28/16 1316  BNP 232.0* 294.0* 222.0*    ProBNP (last 3 results) No results for input(s): PROBNP in the last 8760 hours.    Other results:  Imaging: Dg Chest 2 View  Result Date: 06/28/2016 CLINICAL DATA:  Shortness of Breath EXAM: CHEST  2 VIEW COMPARISON:  05/28/2016 FINDINGS: Cardiac shadow is mildly enlarged but stable. Right-sided pleural effusion and  right basilar atelectasis is again noted and stable. A small left pleural effusion is noted. No focal confluent infiltrate is seen. No acute bony abnormality is noted. IMPRESSION: Small bilateral pleural effusions with right basilar atelectasis. Electronically Signed   By: Inez Catalina M.D.   On: 06/28/2016 13:57      Medications:     Scheduled Medications: . furosemide  40 mg Intravenous BID  . insulin aspart  0-9 Units Subcutaneous TID WC  . metFORMIN  250 mg Oral BID WC  . metoprolol succinate  12.5 mg Oral Daily  . sodium chloride flush  3 mL Intravenous Q12H  . traZODone  100 mg Oral QHS  . Warfarin - Physician Dosing Inpatient   Does not apply Q24H     Infusions:     PRN Medications:  sodium chloride, acetaminophen **OR** acetaminophen, bisacodyl, HYDROcodone-acetaminophen, nortriptyline, ondansetron **OR** ondansetron (ZOFRAN) IV,  sodium chloride flush   Assessment:  1. Cardiogenic Shock  2.Acute Respiratory Failure 3.  A fib RVR 4. CAD -  2 veseel disease 5. Valvular Heart Disease-  Severe AS/severe MR 6. Caroltid Stenosis- Severe carotid stenosis RICA 80-90s   Plan/Discussion:    Acutely SOB with A fib RVR. O2 Sat 80%. Now on 4 liters with O2 Sat up to 100%.  Stat CXR .  Volume status does not appear elevated. Legs cool suspect low output with cardiogenic shock. RHC from 06/2016 reviewed with low output noted at that time. Will likely need inotropes.   Place central line. Will need CO-OX and CVP.  Give 80 mg IV lasix now. Start milrinone 0.25 mcg. Move to ICU. Will need to stop bb. No ace with elevated creatinine.    Afib RVR 140-150s, Received Toprol this morning. Start amio drip and heparin     Dr Haroldine Laws aware and agrees with plan.    Length of Stay: 2   Amy Clegg NP-C  06/30/2016, 9:06 AM  Advanced Heart Failure Team Pager [phone] (M-F; 7a - 4p)  Please contact [facility] for night-coverage after hours (4p -7a ) and weekends on [email or web address]   Patient seen and examined with Darrick Grinder, NP. We discussed all aspects of the encounter. I agree with the assessment and plan as stated above.   Echo and cath films/data reviewed personally and reviewed with Dr. Roxy Manns in person. Mr. Griscom was in cardiogenic shock with acute respiratory failue and AF with RVR this morning. Rapid Response was called and we moved him to the CCU. He was treated with IV lasix, mirlinone and IV amiodarone with subsequent improvement but he still remains very tenuous. In speaking with him and his family he has had a precipitous decline over the last 6 months and now is minimally active.   Echo shows severe LV dysfunction with severe AS and severe MR which is mostly functional in nature. Cath shows moderate CAD with high filling pressures and low cardiac output. Given his age, low EF and poor functional capacity, I am not sure we can  get him through a double valve surgery/CABG and CEA. We will attempt to optimize him in the ICU over the next few days and if he improves the best option may be to consider TAVR and see if his LLV function and MR improve with treatment of his AS. We will also need to discuss treatment of his carotid stenosis with [facility] and our interventional partners.   He and his family realize that he is sick enough now that we may never get  him to the point where he is ready for any high-risk procedures and we may have to consider palliative care as an option. We will see how he progresses.   The patient is critically ill with multiple organ systems failure and requires high complexity decision making for assessment and support, frequent evaluation and titration of therapies, application of advanced monitoring technologies and extensive interpretation of multiple databases.   Critical Care Time devoted to patient care services described in this note is 90 Minutes.  Khloi Rawl,MD 12:32 AM

## 2016-06-30 NOTE — Progress Notes (Signed)
Patient HR 130's to 140's. Ninfa Meeker, NP and Rapid response, charge nurse and rapid response nurse at bedside. Patient will be transferred to [facility] for further evaluation.

## 2016-06-30 NOTE — Progress Notes (Signed)
RT called to bedside by MD for Rapid Response. Pt with Increased RR but stable on 4L [place]. Pt with clear/diminished BS. Pt went into a-fib with RVR and will be transferred to [facility]. Per MD, RT not needed at this time, but will continue to monitor. RT called report to receiving RT.

## 2016-07-01 ENCOUNTER — Encounter: Payer: Medicare Other | Admitting: Thoracic Surgery (Cardiothoracic Vascular Surgery)

## 2016-07-01 ENCOUNTER — Other Ambulatory Visit: Payer: Self-pay | Admitting: Vascular Surgery

## 2016-07-01 ENCOUNTER — Encounter: Payer: Medicare Other | Admitting: Vascular Surgery

## 2016-07-01 ENCOUNTER — Encounter (HOSPITAL_COMMUNITY): Payer: Self-pay | Admitting: Thoracic Surgery (Cardiothoracic Vascular Surgery)

## 2016-07-01 DIAGNOSIS — J9601 Acute respiratory failure with hypoxia: Secondary | ICD-10-CM | POA: Diagnosis present

## 2016-07-01 DIAGNOSIS — R570 Cardiogenic shock: Secondary | ICD-10-CM | POA: Diagnosis present

## 2016-07-01 DIAGNOSIS — I6523 Occlusion and stenosis of bilateral carotid arteries: Secondary | ICD-10-CM

## 2016-07-01 DIAGNOSIS — I5043 Acute on chronic combined systolic (congestive) and diastolic (congestive) heart failure: Secondary | ICD-10-CM

## 2016-07-01 LAB — CARBOXYHEMOGLOBIN
CARBOXYHEMOGLOBIN: 1.4 % (ref 0.5–1.5)
Carboxyhemoglobin: 1.5 % (ref 0.5–1.5)
METHEMOGLOBIN: 0.7 % (ref 0.0–1.5)
Methemoglobin: 0.8 % (ref 0.0–1.5)
O2 SAT: 47.7 %
O2 SAT: 56 %
TOTAL HEMOGLOBIN: 13.9 g/dL (ref 13.5–18.0)
Total hemoglobin: 14.6 g/dL (ref 13.5–18.0)

## 2016-07-01 LAB — CBC
HCT: 42.5 % (ref 39.0–52.0)
Hemoglobin: 14 g/dL (ref 13.0–17.0)
MCH: 29.9 pg (ref 26.0–34.0)
MCHC: 32.9 g/dL (ref 30.0–36.0)
MCV: 90.8 fL (ref 78.0–100.0)
PLATELETS: 272 10*3/uL (ref 150–400)
RBC: 4.68 MIL/uL (ref 4.22–5.81)
RDW: 15 % (ref 11.5–15.5)
WBC: 10.1 10*3/uL (ref 4.0–10.5)

## 2016-07-01 LAB — BASIC METABOLIC PANEL
ANION GAP: 9 (ref 5–15)
BUN: 18 mg/dL (ref 6–20)
CALCIUM: 9.5 mg/dL (ref 8.9–10.3)
CO2: 30 mmol/L (ref 22–32)
Chloride: 97 mmol/L — ABNORMAL LOW (ref 101–111)
Creatinine, Ser: 1.27 mg/dL — ABNORMAL HIGH (ref 0.61–1.24)
GFR, EST AFRICAN AMERICAN: 59 mL/min — AB (ref >60)
GFR, EST NON AFRICAN AMERICAN: 51 mL/min — AB (ref >60)
GLUCOSE: 157 mg/dL — AB (ref 65–99)
Potassium: 4.2 mmol/L (ref 3.5–5.1)
SODIUM: 136 mmol/L (ref 135–145)

## 2016-07-01 LAB — GLUCOSE, CAPILLARY
GLUCOSE-CAPILLARY: 165 mg/dL — AB (ref 65–99)
Glucose-Capillary: 145 mg/dL — ABNORMAL HIGH (ref 65–99)
Glucose-Capillary: 140 mg/dL — ABNORMAL HIGH (ref 65–99)

## 2016-07-01 LAB — HEPARIN LEVEL (UNFRACTIONATED)
HEPARIN UNFRACTIONATED: 0.42 [IU]/mL (ref 0.30–0.70)
Heparin Unfractionated: 0.48 IU/mL (ref 0.30–0.70)

## 2016-07-01 MED ORDER — MILRINONE LACTATE IN DEXTROSE 20-5 MG/100ML-% IV SOLN
0.3750 ug/kg/min | INTRAVENOUS | Status: DC
  Administered 2016-07-01 – 2016-07-19 (×28): 0.25 ug/kg/min via INTRAVENOUS
  Administered 2016-07-19 (×2): 0.375 ug/kg/min via INTRAVENOUS
  Filled 2016-07-01 (×35): qty 100

## 2016-07-01 MED ORDER — MAGNESIUM SULFATE 2 GM/50ML IV SOLN
2.0000 g | Freq: Once | INTRAVENOUS | Status: AC
  Administered 2016-07-01: 2 g via INTRAVENOUS
  Filled 2016-07-01: qty 50

## 2016-07-01 MED ORDER — FUROSEMIDE 40 MG PO TABS
40.0000 mg | ORAL_TABLET | Freq: Every day | ORAL | Status: DC
  Administered 2016-07-01 – 2016-07-04 (×4): 40 mg via ORAL
  Filled 2016-07-01 (×5): qty 1

## 2016-07-01 MED ORDER — DIGOXIN 125 MCG PO TABS
0.1250 mg | ORAL_TABLET | Freq: Every day | ORAL | Status: DC
  Administered 2016-07-01 – 2016-07-15 (×15): 0.125 mg via ORAL
  Filled 2016-07-01 (×15): qty 1

## 2016-07-01 NOTE — Progress Notes (Signed)
      [address]       [place] [address]             [phone]        CARDIOTHORACIC SURGERY PROGRESS NOTE  Subjective: Reports feeling a little better, less SOB  Not quite as tachypneic.   Objective: Vital signs: BP Readings from Last 1 Encounters:  07/01/16 (!) 104/49   Pulse Readings from Last 1 Encounters:  07/01/16 83   Resp Readings from Last 1 Encounters:  07/01/16 19   Temp Readings from Last 1 Encounters:  07/01/16 97.6 F (36.4 C) (Oral)    Hemodynamics: CVP:  [0 mmHg-11 mmHg] 10 mmHg  Mixed venous co-ox 47.7%  Physical Exam:  Rhythm:   Afib  Breath sounds: Scattered rhonchi  Heart sounds:  Irregular w/ systolic murmur  Incisions:  n/a  Abdomen:  Soft, non-distended, non-tender  Extremities:  Warm, adequately-perfused    Intake/Output from previous day: 08/17 0701 - 08/18 0700 In: 1847.8 [P.O.:1080; I.V.:767.8] Out: 2830 [Urine:2830] Intake/Output this shift: Total I/O In: 598.2 [P.O.:440; I.V.:108.2; IV Piggyback:50] Out: 300 [Urine:300]  Lab Results:  CBC: Recent Labs  06/28/16 1316 07/01/16 0254  WBC 6.8 10.1  HGB 15.2 14.0  HCT 44.6 42.5  PLT 266 272    BMET:  Recent Labs  06/29/16 0405 07/01/16 0254  NA 137 136  K 3.1* 4.2  CL 97* 97*  CO2 29 30  GLUCOSE 148* 157*  BUN 20 18  CREATININE 1.16 1.27*  CALCIUM 9.1 9.5     PT/INR:   Recent Labs  06/30/16 0404  LABPROT 18.5*  INR 1.53    CBG (last 3)   Recent Labs  06/30/16 1620 06/30/16 2115 07/01/16 0752  GLUCAP 139* 145* 145*    ABG    Component Value Date/Time   PHART 7.458 (H) 06/17/2016 1035   PCO2ART 38.7 06/17/2016 1035   PO2ART 80.0 06/17/2016 1035   HCO3 29.2 (H) 06/17/2016 1053   TCO2 31 06/17/2016 1053   ACIDBASEDEF 0.0 07/25/2011 1319   O2SAT 47.7 07/01/2016 0250    CXR: PORTABLE CHEST 1 VIEW  COMPARISON:  06/30/2016  FINDINGS: Cardiomediastinal silhouette is stable. Mild congestion/pulmonary edema again  noted. There is right arm PICC line with tip in right atrium. No pneumothorax.  IMPRESSION: Right arm PICC line with tip in right atrium. No pneumothorax. Persistent mild congestion/ pulmonary edema.   Electronically Signed   By: Lahoma Crocker M.D.   On: 06/30/2016 16:20   Assessment/Plan:   No significant change but perhaps slightly improved on low dose milrinone.  Will ask Vascular Surgery to see regarding severe RICA stenosis which may be symptomatic.  Assuming this is the case then he would not be considered a candidate for TAVR unless his RICA stenosis was successfully treated first.  If he improves over the weekend will plan cardiac-gated CTA heart and CTA chest/abd/pelvis some time next week to further evaluate the feasibility of TAVR.  CTA of carotid arteries could be done at that time if indicated.  Once he is allowed to resume getting up and out of bed he will need formal physical therapy evaluation.  Will f/u again on Monday.  I spent in excess of 15 minutes during the conduct of this hospital encounter and >50% of this time involved direct face-to-face encounter with the patient for counseling and/or coordination of their care.   Rexene Alberts, MD 07/01/2016 10:48 AM

## 2016-07-01 NOTE — Progress Notes (Signed)
Advanced Heart Failure Rounding Note   Subjective:    Moved to ICU and given IV lasix and started on milrinone yesterday with NYHA IV symptoms.  Diuresed well. PICC placed.   Coox remains low this am at 47.7% despite milrinone 0.125 mcg/kg/min.   Feels OK this morning. Less SOB at rest, but still DOE with minimal exertion including rolling around in bed.   Negative only 300 cc.  No weight yet this am. Creatinine relatively stable. CVP 4-5  RHC/LHC 06/17/16 RA 11 PCWP 25  CO/CI 3.25/1.4   Ost Cx to Prox Cx lesion, 50 %stenosed.  Mid Cx to Dist Cx lesion, 80 %stenosed.  Mid RCA lesion, 40 %stenosed.  Mid LAD lesion, 75 %stenosed.  2nd Diag lesion, 75 %stenosed.  Hemodynamic findings consistent with moderate pulmonary hypertension.  Unable to cross the aortic valve.  PA sat 52%. CO 3.2 L/min.  Objective:   Weight Range:  Vital Signs:   Temp:  [97.4 F (36.3 C)-98.7 F (37.1 C)] 97.6 F (36.4 C) (08/18 0400) Pulse Rate:  [34-139] 42 (08/18 0600) Resp:  [10-40] 10 (08/18 0600) BP: (89-152)/(53-138) 107/53 (08/18 0600) SpO2:  [90 %-100 %] 100 % (08/18 0600) Weight:  [210 lb 12.2 oz (95.6 kg)] 210 lb 12.2 oz (95.6 kg) (08/17 1000) Last BM Date: 06/30/16  Weight change: Filed Weights   06/29/16 1700 06/30/16 0415 06/30/16 1000  Weight: 213 lb 1.6 oz (96.7 kg) 211 lb 3.2 oz (95.8 kg) 210 lb 12.2 oz (95.6 kg)    Intake/Output:   Intake/Output Summary (Last 24 hours) at 07/01/16 0725 Last data filed at 07/01/16 0600  Gross per 24 hour  Intake          1813.47 ml  Output             2830 ml  Net         -1016.53 ml     Physical Exam: CVP 4-5 General:  Dyspneic at rest. In bed HEENT: normal Neck: supple. JVP flat. Carotids 2+ bilat; no bruits. No thyromegaly or nodule noted. Cor: PMI nondisplaced. Irregular RVR ate & rhythm. No rubs, or murmurs. + S3  Lungs: Decreased in bases on 4 liters [place] Abdomen: soft, NT, ND, no HSM. No bruits or masses. +BS    Extremities: no cyanosis, clubbing, rash,  R and LLE no edema. Cool to the touch.  Neuro: alert & orientedx3, cranial nerves grossly intact. moves all 4 extremities w/o difficulty. Affect pleasant  Telemetry: Reviewed personally, 90-110s   Labs: Basic Metabolic Panel:  Recent Labs Lab 06/28/16 1316 06/29/16 0405 06/30/16 1315 07/01/16 0254  NA 137 137  --  136  K 3.1* 3.1*  --  4.2  CL 99* 97*  --  97*  CO2 28 29  --  30  GLUCOSE 118* 148*  --  157*  BUN 19 20  --  18  CREATININE 1.10 1.16  --  1.27*  CALCIUM 9.3 9.1  --  9.5  MG  --   --  1.9  --     Liver Function Tests:  Recent Labs Lab 06/28/16 1316  AST 24  ALT 27  ALKPHOS 72  BILITOT 1.7*  PROT 7.1  ALBUMIN 3.9   No results for input(s): LIPASE, AMYLASE in the last 168 hours. No results for input(s): AMMONIA in the last 168 hours.  CBC:  Recent Labs Lab 06/28/16 1316 07/01/16 0254  WBC 6.8 10.1  NEUTROABS 5.1  --   HGB  15.2 14.0  HCT 44.6 42.5  MCV 89.6 90.8  PLT 266 272    Cardiac Enzymes: No results for input(s): CKTOTAL, CKMB, CKMBINDEX, TROPONINI in the last 168 hours.  BNP: BNP (last 3 results)  Recent Labs  05/03/16 1305 05/28/16 1617 06/28/16 1316  BNP 232.0* 294.0* 222.0*    ProBNP (last 3 results) No results for input(s): PROBNP in the last 8760 hours.    Other results:  Imaging: Dg Chest Port 1 View  Result Date: 06/30/2016 CLINICAL DATA:  Central line placement EXAM: PORTABLE CHEST 1 VIEW COMPARISON:  06/30/2016 FINDINGS: Cardiomediastinal silhouette is stable. Mild congestion/pulmonary edema again noted. There is right arm PICC line with tip in right atrium. No pneumothorax. IMPRESSION: Right arm PICC line with tip in right atrium. No pneumothorax. Persistent mild congestion/ pulmonary edema. Electronically Signed   By: Lahoma Crocker M.D.   On: 06/30/2016 16:20   Dg Chest Port 1 View  Result Date: 06/30/2016 CLINICAL DATA:  Shortness of breath today. EXAM: PORTABLE  CHEST 1 VIEW COMPARISON:  Chest x-rays dated 06/28/2016 and 05/28/2016. FINDINGS: Mild cardiomegaly is stable. There is central pulmonary vascular congestion without overt alveolar pulmonary edema. Suspect small right pleural effusion and mild bibasilar atelectasis. No pneumothorax seen. Atherosclerotic changes noted at the aortic arch. Osseous structures about the chest are unremarkable. IMPRESSION: 1. Cardiomegaly with central pulmonary vascular congestion suggesting mild CHF/volume overload. 2. Cardiomegaly is stable. 3. Probable small right pleural effusion. 4. Aortic atherosclerosis. Electronically Signed   By: Franki Cabot M.D.   On: 06/30/2016 13:37     Medications:     Scheduled Medications: . antiseptic oral rinse  7 mL Mouth Rinse BID  . furosemide  80 mg Intravenous BID  . insulin aspart  0-9 Units Subcutaneous TID WC  . potassium chloride  40 mEq Oral BID  . sodium chloride flush  10-40 mL Intracatheter Q12H  . sodium chloride flush  3 mL Intravenous Q12H  . traZODone  100 mg Oral QHS    Infusions: . amiodarone 30 mg/hr (06/30/16 1931)  . heparin 1,400 Units/hr (06/30/16 1931)  . milrinone 0.125 mcg/kg/min (06/30/16 1931)    PRN Medications: sodium chloride, acetaminophen **OR** acetaminophen, bisacodyl, HYDROcodone-acetaminophen, nortriptyline, ondansetron **OR** ondansetron (ZOFRAN) IV, sodium chloride flush, sodium chloride flush   Assessment:   1. Cardiogenic Shock  2. Acute Respiratory Failure 3.  A fib RVR 4. CAD -  2 veseel disease 5. Valvular Heart Disease-  Severe AS/severe MR 6. Caroltid Stenosis- Severe carotid stenosis RICA 80-90s   Plan/Discussion:    Volume status appears stable. Primarily a low output presentation.  Will increase milrinone to 0.25 mcg/kg/min. Unsure why it was on 0.125 this am, but looks as if it was ordered this way initially.   Will switch po lasix to his home dose of 40 mg daily. And can repeat IV lasix as needed.   No BB with  low output. No ace with elevated creatinine.   Add digoxin 0.125 mg daily.  Pressure relatively stable this morning in 100s.   Continue amio drip at 30 mg/hr and heparin for Afib      Plan is to optimize to see if he would be even considered for valvular surgery +/- bypass. At least may consider TAVR alone.   Pt and family are aware that he may not be stabilized to this point.   Prognosis very guarded at this point. Discussions ongoing.   Length of Stay: 3  Shirley Friar PA-C 07/01/2016, 7:25  AM  Advanced Heart Failure Team Pager [phone] (M-F; 7a - 4p)  Please contact [facility] for night-coverage after hours (4p -7a ) and weekends on [email or web address]  Patient seen and examined with Oda Kilts, PA-C. We discussed all aspects of the encounter. I agree with the assessment and plan as stated above.   He has diuresed well. CVP down to 4-5 but respiratory status still tenuous. Co-ox remains very low. Agree with increasing milrinone but watch BP closely as his afterload is fixed with AS and he may get hypotensive. Add norepi as needed. Will continue to try and optimize over the weekend and plan full RHC next week. Continue amio and heparin for AF. Switch lasix to po. Will continue to discuss options with Dr. Roxy Manns. Possibility of TAVR has been raised but he remains extremely high risk. Carotid stenosis will also need to be addressed at some point. Will try and mobilize as tolerated.  Tarae Wooden,MD 8:59 AM

## 2016-07-01 NOTE — Progress Notes (Signed)
ANTICOAGULATION CONSULT NOTE - Follow Up Consult  Pharmacy Consult for Heparin (warfarin on hold) Indication: atrial fibrillation  Allergies  Allergen Reactions  . Ambien [Zolpidem Tartrate] Other (See Comments)    Sleep walks  . Cholestatin   . Lipitor [Atorvastatin Calcium] Other (See Comments)    myalgias  . Ranitidine Other (See Comments)    Chest discomfort  . Simvastatin Other (See Comments)    Myalgias  . Xanax Xr [Alprazolam Er]     Tightness in chest    Patient Measurements: Height: 6\' 1"  (185.4 cm) Weight: 210 lb 12.2 oz (95.6 kg) IBW/kg (Calculated) : 79.9  Vital Signs: Temp: 97.5 F (36.4 C) (08/17 2315) Temp Source: Oral (08/17 2315) BP: 114/61 (08/17 2000) Pulse Rate: 89 (08/17 2000)  Labs:  Recent Labs  06/28/16 1316 06/29/16 0405 06/30/16 0404 07/01/16 0254  HGB 15.2  --   --  14.0  HCT 44.6  --   --  42.5  PLT 266  --   --  272  LABPROT 19.8* 20.4* 18.5*  --   INR 1.66 1.72 1.53  --   HEPARINUNFRC  --   --   --  0.42  CREATININE 1.10 1.16  --  1.27*    Estimated Creatinine Clearance: 51.6 mL/min (by C-G formula based on SCr of 1.27 mg/dL).  Assessment: Heparin while warfarin on hold, initial heparin level is therapeutic at 0.42  Goal of Therapy:  Heparin level 0.3-0.7 units/ml Monitor platelets by anticoagulation protocol: Yes   Plan:  -Cont heparin at 1400 units/hr -1200 confirmatory HL   Narda Bonds 07/01/2016,4:00 AM

## 2016-07-01 NOTE — Progress Notes (Signed)
ANTICOAGULATION CONSULT NOTE - Follow Up Consult  Pharmacy Consult for Heparin Indication: atrial fibrillation  Allergies  Allergen Reactions  . Ambien [Zolpidem Tartrate] Other (See Comments)    Sleep walks  . Cholestatin   . Lipitor [Atorvastatin Calcium] Other (See Comments)    myalgias  . Ranitidine Other (See Comments)    Chest discomfort  . Simvastatin Other (See Comments)    Myalgias  . Xanax Xr [Alprazolam Er]     Tightness in chest    Patient Measurements: Height: 6\' 1"  (185.4 cm) Weight: 207 lb 0.2 oz (93.9 kg) IBW/kg (Calculated) : 79.9  Vital Signs: Temp: 97.5 F (36.4 C) (08/18 1100) Temp Source: Oral (08/18 1100) BP: 115/48 (08/18 1100) Pulse Rate: 84 (08/18 1100)  Labs:  Recent Labs  06/29/16 0405 06/30/16 0404 07/01/16 0254 07/01/16 1200  HGB  --   --  14.0  --   HCT  --   --  42.5  --   PLT  --   --  272  --   LABPROT 20.4* 18.5*  --   --   INR 1.72 1.53  --   --   HEPARINUNFRC  --   --  0.42 0.48  CREATININE 1.16  --  1.27*  --     Estimated Creatinine Clearance: 51.6 mL/min (by C-G formula based on SCr of 1.27 mg/dL).  Medications: Heparin @ 1400 units/hr  Assessment: 83yom continues on heparin for afib (while coumadin on hold) in the setting of cardiogenic shock. Heparin level is therapeutic at 0.48. CBC is stable. No bleeding.  Goal of Therapy:  Heparin level 0.3-0.7 units/ml Monitor platelets by anticoagulation protocol: Yes   Plan:  1) Continue heparin at 1400 units/hr 2) Daily heparin level, CBC  Deboraha Sprang 07/01/2016,2:10 PM

## 2016-07-01 NOTE — Consult Note (Signed)
Patient name: [name] [name] MRN: [id number] DOB: November 12, 1932 Sex: male  REASON FOR CONSULT: bilateral carotid stenosis, consult is from Dr. Roxy Manns ([facility])  HPI: [name] [name] is a 82 y.o. male, who presents for evaluation of bilateral carotid stenosis. The patient is accompanied by his niece at the bedside. The niece provides some of the history. Recent carotid duplex from on 06/22/16 from [facility] outpatient vascular lab revealed RICA 80-99% with PSV 477 cm/s EDV 223 cm/s. RECA stenosis >50 % with PSV 271 cm/s.  LICA stenosis of 123456 with PSV of 254 cm/s, EDV 52 cm/s. He presented to the [facility] ED on 06/28/16 with increasing SOB.  He is followed by cardiologist Dr. Harl Bowie. Despite significant diuresis, his symptoms of SOB, DOE and orthopnea have progressed. The patient reports shortness of breath at rest and minimal ambulation distance.   Regarding his carotid stenosis, the patient was seen by Dr. Scot Dock back in 2013. At that time, he had mild bilateral asymptomatic carotid stenosis. His disease did not progressed to greater than 80% therefore routine carotid surveillance was recommended. He has not followed up since then. He denies amaurosis fugax. He has mentioned some episodes of blurriness. In the last 2-3 months, the patient reported an episode of right upper extremity weakness. He says he was sitting around and holding something and it fell out of his hand. He denies any hemiplegia, expressive or receptive aphasia. He denies a history of TIA/stroke in the past. He had an MRI of the head back on 04/03/2012 that showed no intracranial hemorrhage but possible remote infarcts although posttraumatic and cephalo-malacia was not able to be excluded.  The MRI was done for slurred speech and headache. The patient is right handed.   His past medical history is significant for recently diagnosed (dx last month) chronic systolic HF with LVEF 123456, recently diagnosed 2 vessel CAD by cath, moderate to severe  AS, and moderate to severe MR. Dr. Roxy Manns with [facility] was consulted regarding his CAD and severe valvular disease. Prior to 2 months ago, the patient was active.  His past medical history also includes chronic afib on Coumadin, CKD stage II. Atherosclerotic risk factors include HLD, HTN, DM. He denies having a previous MI. He reports chest pain 3-4 years ago. The patient is a previous smoker quitting approximately 20 years ago. He however chews tobacco.  Today, he reports that his breathing is better.  For social history, the patient lives at home with his wife. His wife is ill and requires a home nurse aide. He is mostly sedentary.  Past Medical History:  Diagnosis Date  . Aortic stenosis    a. mod-sev by echo 05/2016.  . Asthma   . Cerebrovascular disease 07/2010   TIA; carotid ultrasound in 07/2010-significant bilateral plaque without focal internal carotid artery stenosis; MRI -encephalomalacia left temporal and right temporal lobes; small inferior right cerebellar infarct; small vessel disease  . Chronic atrial fibrillation (HCC)    Paroxysmal; Echocardiogram in 2007-normal EF; mild LVH; left atrial enlargement; mild stenosis and minimal AI; negative stress nuclear study in 2008  . Chronic systolic CHF (congestive heart failure) ([place])    a. dx 05/2016 - EF 35-40%, diffuse HK, mod-severe AS, mod gradient, severe AVA VTI likely due to decreased cardiac output in setting of systolic dysfsunction and significant mitral regurgitation, mild MR, mod-severe MR, severe LAE, mild-mod RV dilation, mild RAE, mild-mod TR, mod PASP 39mmHg.  . CKD (chronic kidney disease), stage II   .  Degenerative joint disease    feet and legs  . Diabetes mellitus    no insulin; A1c of 6.6 in 2005  . Dizziness    occurs daily,especially in am  . Exertional dyspnea   . Gastroesophageal reflux disease   . Hepatic steatosis   . History of noncompliance with medical treatment   . Hyperlipidemia    adverse reactions to  statins and niacin  . Hypertension    Borderline  . Irregular heartbeat   . Mitral regurgitation    a. mod-sev by echo 05/2016  . Peripheral vascular disease ([place])   . Renal insufficiency   . Temporal arteritis ([place])   . Tricuspid regurgitation    a. mild-mod TR by echo 05/2016    Family History  Problem Relation Age of Onset  . Stroke Mother   . Diabetes Father   . Colon cancer Neg Hx     SOCIAL HISTORY: Social History   Social History  . Marital status: Married    Spouse name: N/A  . Number of children: 1  . Years of education: N/A   Occupational History  . Retired    Social History Main Topics  . Smoking status: Former Smoker    Packs/day: 1.00    Years: 20.00    Types: Cigarettes    Start date: 07/04/1950    Quit date: 04/25/1992  . Smokeless tobacco: Current User    Types: Chew  . Alcohol use No  . Drug use: No  . Sexual activity: Not Currently   Other Topics Concern  . Not on file   Social History Narrative  . No narrative on file    Allergies  Allergen Reactions  . Ambien [Zolpidem Tartrate] Other (See Comments)    Sleep walks  . Cholestatin   . Lipitor [Atorvastatin Calcium] Other (See Comments)    myalgias  . Ranitidine Other (See Comments)    Chest discomfort  . Simvastatin Other (See Comments)    Myalgias  . Xanax Xr [Alprazolam Er]     Tightness in chest    Current Facility-Administered Medications  Medication Dose Route Frequency Provider Last Rate Last Dose  . 0.9 %  sodium chloride infusion  250 mL Intravenous PRN Roney Jaffe, MD      . acetaminophen (TYLENOL) tablet 650 mg  650 mg Oral Q6H PRN Roney Jaffe, MD       Or  . acetaminophen (TYLENOL) suppository 650 mg  650 mg Rectal Q6H PRN Roney Jaffe, MD      . amiodarone (NEXTERONE PREMIX) 360-4.14 MG/200ML-% (1.8 mg/mL) IV infusion  30 mg/hr Intravenous Continuous Jolaine Artist, MD 16.7 mL/hr at 06/30/16 1931 30 mg/hr at 06/30/16 1931  . antiseptic oral rinse (CPC /  CETYLPYRIDINIUM CHLORIDE 0.05%) solution 7 mL  7 mL Mouth Rinse BID Satira Mccallum Tillery, PA-C   7 mL at 07/01/16 0800  . bisacodyl (DULCOLAX) suppository 10 mg  10 mg Rectal Daily PRN Roney Jaffe, MD   10 mg at 06/30/16 0834  . furosemide (LASIX) tablet 40 mg  40 mg Oral Daily Satira Mccallum Tillery, PA-C   40 mg at 07/01/16 1016  . heparin ADULT infusion 100 units/mL (25000 units/266mL sodium chloride 0.45%)  1,400 Units/hr Intravenous Continuous Otilio Miu, RPH 14 mL/hr at 06/30/16 1931 1,400 Units/hr at 06/30/16 1931  . HYDROcodone-acetaminophen (NORCO/VICODIN) 5-325 MG per tablet 1 tablet  1 tablet Oral TID PRN Roney Jaffe, MD      . insulin aspart (novoLOG) injection 0-9  Units  0-9 Units Subcutaneous TID WC Roney Jaffe, MD   1 Units at 07/01/16 [phone]  . milrinone (PRIMACOR) 20 MG/100 ML (0.2 mg/mL) infusion  0.25 mcg/kg/min Intravenous Continuous Shirley Friar, PA-C 7.2 mL/hr at 07/01/16 0933 0.25 mcg/kg/min at 07/01/16 0933  . nortriptyline (PAMELOR) capsule 10 mg  10 mg Oral QHS PRN Roney Jaffe, MD      . ondansetron [facility]) tablet 4 mg  4 mg Oral Q6H PRN Roney Jaffe, MD       Or  . ondansetron [facility]) injection 4 mg  4 mg Intravenous Q6H PRN Roney Jaffe, MD      . potassium chloride SA (K-DUR,KLOR-CON) CR tablet 40 mEq  40 mEq Oral BID Satira Mccallum Tillery, PA-C   40 mEq at 07/01/16 1016  . sodium chloride flush (NS) 0.9 % injection 10-40 mL  10-40 mL Intracatheter Q12H Satira Mccallum Tillery, PA-C   10 mL at 06/30/16 1600  . sodium chloride flush (NS) 0.9 % injection 10-40 mL  10-40 mL Intracatheter PRN Satira Mccallum Tillery, PA-C      . sodium chloride flush (NS) 0.9 % injection 3 mL  3 mL Intravenous Q12H Roney Jaffe, MD   3 mL at 07/01/16 1000  . sodium chloride flush (NS) 0.9 % injection 3 mL  3 mL Intravenous PRN Roney Jaffe, MD      . traZODone (DESYREL) tablet 100 mg  100 mg Oral QHS Roney Jaffe, MD   100 mg at 06/30/16 2153     REVIEW OF SYSTEMS:  [X]  denotes positive finding, [ ]  denotes negative finding Cardiac  Comments:  Chest pain or chest pressure:    Shortness of breath upon exertion: x With walking a few yards   Short of breath when lying flat: x   Irregular heart rhythm: x Chronic atrial fibrillation       Vascular    Pain in calf, thigh, or hip brought on by ambulation:    Pain in feet at night that wakes you up from your sleep:     Blood clot in your veins:    Leg swelling:         Pulmonary    Oxygen at home:    Productive cough:     Wheezing:         Neurologic    Sudden weakness in arms or legs:  x Right arm, 2-3 months ago with holding an item in his right hand.   Sudden numbness in arms or legs:     Sudden onset of difficulty speaking or slurred speech:    Temporary loss of vision in one eye:     Problems with dizziness:  x       Gastrointestinal    Blood in stool:     Vomited blood:         Genitourinary    Burning when urinating:     Blood in urine:        Psychiatric    Major depression:         Hematologic    Bleeding problems:    Problems with blood clotting too easily:        Skin    Rashes or ulcers:        Constitutional    Fever or chills:      PHYSICAL EXAM: Vitals:   07/01/16 0600 07/01/16 0700 07/01/16 0800 07/01/16 1000  BP: (!) 107/53 133/66 (!) 142/104 (!) 104/49  Pulse: (!) 42 (!) 38 [name])  47 83  Resp: 10 (!) 25 (!) 22 19  Temp:      TempSrc:      SpO2: 100% 98% 98% 94%  Weight:   207 lb 0.2 oz (93.9 kg)   Height:        GENERAL: The patient is a well-nourished male, in no acute distress. The patient often turns to his niece having difficulty answering questions. He pausesevery few words to breathe. The vital signs are documented above. CARDIAC: Irregularly irregular. No carotid bruits. VASCULAR: 2+ radial pulses bilaterally. 2+ femoral pulses bilaterally. Prominent right popliteal pulse. Nonpalpable left popliteal pulse. Palpable dorsalis  pedis pulses bilaterally. Palpable right posterior tibial pulse. PULMONARY: Decreased breath sounds at bases bilaterally. No rales heard. On 4 L nasal cannula. ABDOMEN: Soft and non-tender. No pulsatile mass, although her abdomen is obese. MUSCULOSKELETAL: There are no major deformities.  Cyanotic feet.  Some chronic venous skin changes. NEUROLOGIC: No focal weakness or paresthesias are detected. CN 2-12 intact SKIN: There are no ulcers or rashes noted.   PSYCHIATRIC: The patient has a normal affect. HEAD: [place]/AT EAR/NOSE/THROAT: Hearing grossly intact, nares without erythema or drainage, oropharynx with Erythema w/o Exudate, Mallampati score: 3 NECK: Supple, no nuchal rigidity, no palpable LAD LYMPH:  No Cervical, Axillary, or Inguinal lymphadenopathy   DATA: Recent carotid duplex from on 06/22/16 from [facility] outpatient vascular lab revealed RICA 80-99% with PSV 477 cm/s EDV 223 cm/s. RECA stenosis >50 % with PSV 271 cm/s.  LICA stenosis of 123456 with PSV of 254 cm/s, EDV 52 cm/s.  Vertebral arteries are patent with antegrade flow bilaterally.  Laboratory: CBC:    Component Value Date/Time   WBC 10.1 07/01/2016 0254   RBC 4.68 07/01/2016 0254   HGB 14.0 07/01/2016 0254   HCT 42.5 07/01/2016 0254   HCT 44.9 04/13/2016 1010   PLT 272 07/01/2016 0254   PLT 197 04/13/2016 1010   MCV 90.8 07/01/2016 0254   MCV 88 04/13/2016 1010   MCH 29.9 07/01/2016 0254   MCHC 32.9 07/01/2016 0254   RDW 15.0 07/01/2016 0254   RDW 13.7 04/13/2016 1010   LYMPHSABS 1.0 06/28/2016 1316   LYMPHSABS 0.5 (L) 04/13/2016 1010   MONOABS 0.5 06/28/2016 1316   EOSABS 0.2 06/28/2016 1316   EOSABS 0.1 04/13/2016 1010   BASOSABS 0.1 06/28/2016 1316   BASOSABS 0.0 04/13/2016 1010    BMP:    Component Value Date/Time   NA 136 07/01/2016 0254   NA 139 05/09/2016 1357   K 4.2 07/01/2016 0254   CL 97 (L) 07/01/2016 0254   CO2 30 07/01/2016 0254   GLUCOSE 157 (H) 07/01/2016 0254   BUN 18 07/01/2016 0254    BUN 16 05/09/2016 1357   CREATININE 1.27 (H) 07/01/2016 0254   CREATININE 1.31 (H) 06/09/2016 1654   CALCIUM 9.5 07/01/2016 0254   GFRNONAA 51 (L) 07/01/2016 0254   GFRAA 59 (L) 07/01/2016 0254    Coagulation: Lab Results  Component Value Date   INR 1.53 06/30/2016   INR 1.72 06/29/2016   INR 1.66 06/28/2016   No results found for: PTT  Lipids:    Component Value Date/Time   CHOL 225 (H) 04/13/2016 1010   TRIG 128 04/13/2016 1010   HDL 34 (L) 04/13/2016 1010   CHOLHDL 6.6 (H) 04/13/2016 1010   CHOLHDL 5.9 08/01/2014 1003   VLDL 49 (H) 08/01/2014 1003   LDLCALC 165 (H) 04/13/2016 1010     MEDICAL ISSUES: High grade right asymptomatic right ICA stenosis  Moderate asymptomatic left ICA stenosis   The patient reports an episode of sudden right upper extremity weakness approximately 2-3 months ago that resolved after a few minutes. He provides very vague details and during the interview, he often turns to his niece for help with answering questions. Am not convinced that this was a TIA. He has no prior history of TIA or stroke. Given his significant cardiac comorbidities, would not pursue carotid endarterectomy at this time. He may possibly be considered for a carotid stent at some point. Dr. Bridgett Larsson to evaluate patient this afternoon and make further recommendations.       Virgina Jock, PA-C [facility] [place] [phone]  Addendum  I have independently interviewed and examined the patient, and I agree with the physician assistant's findings.  Pt describes to me transient LEFT sided weakness over one year ago.  Obviously this is different from what he told my PA.  In the carotid duplex report, the vascular tech notes some technical limitations with the B carotid duplex, so I would recommend: CTA head and neck as part of his imaging as suggested by Dr. Ricard Dillon.  His neuro exam is relatively normal today with abnormal cardiopulmonary exam.  1. Severe AS 2.  Severe MR 3. CAD - 2v 4. Possible sx R ICA stenosis >80%  - CTA Neck and Head once hemodynamically stable. - I suspect his cardiac status will favor R CAS over CEA. - Will have Dr. Scot Dock resume care of this patient on Monday.  Adele Barthel, MD [facility] [place] Office: [phone] Pager: [phone]  07/01/2016, 2:01 PM

## 2016-07-02 ENCOUNTER — Inpatient Hospital Stay (HOSPITAL_COMMUNITY): Payer: Medicare Other

## 2016-07-02 DIAGNOSIS — I481 Persistent atrial fibrillation: Secondary | ICD-10-CM

## 2016-07-02 LAB — BASIC METABOLIC PANEL
ANION GAP: 12 (ref 5–15)
BUN: 19 mg/dL (ref 6–20)
CHLORIDE: 96 mmol/L — AB (ref 101–111)
CO2: 28 mmol/L (ref 22–32)
CREATININE: 1.08 mg/dL (ref 0.61–1.24)
Calcium: 9.2 mg/dL (ref 8.9–10.3)
GFR calc non Af Amer: >60 mL/min (ref >60)
Glucose, Bld: 204 mg/dL — ABNORMAL HIGH (ref 65–99)
Potassium: 4.3 mmol/L (ref 3.5–5.1)
SODIUM: 136 mmol/L (ref 135–145)

## 2016-07-02 LAB — CBC
HCT: 41.1 % (ref 39.0–52.0)
HEMOGLOBIN: 13.6 g/dL (ref 13.0–17.0)
MCH: 30 pg (ref 26.0–34.0)
MCHC: 33.1 g/dL (ref 30.0–36.0)
MCV: 90.5 fL (ref 78.0–100.0)
Platelets: 230 10*3/uL (ref 150–400)
RBC: 4.54 MIL/uL (ref 4.22–5.81)
RDW: 14.9 % (ref 11.5–15.5)
WBC: 8.1 10*3/uL (ref 4.0–10.5)

## 2016-07-02 LAB — GLUCOSE, CAPILLARY
GLUCOSE-CAPILLARY: 126 mg/dL — AB (ref 65–99)
Glucose-Capillary: 145 mg/dL — ABNORMAL HIGH (ref 65–99)
Glucose-Capillary: 205 mg/dL — ABNORMAL HIGH (ref 65–99)
Glucose-Capillary: 171 mg/dL — ABNORMAL HIGH (ref 65–99)

## 2016-07-02 LAB — HEPARIN LEVEL (UNFRACTIONATED): HEPARIN UNFRACTIONATED: 0.49 [IU]/mL (ref 0.30–0.70)

## 2016-07-02 LAB — CARBOXYHEMOGLOBIN
Carboxyhemoglobin: 1.2 % (ref 0.5–1.5)
Methemoglobin: 0.8 % (ref 0.0–1.5)
O2 SAT: 61.6 %
TOTAL HEMOGLOBIN: 13.9 g/dL (ref 13.5–18.0)

## 2016-07-02 MED ORDER — ASPIRIN 81 MG PO CHEW
81.0000 mg | CHEWABLE_TABLET | Freq: Every day | ORAL | Status: DC
  Administered 2016-07-02 – 2016-07-18 (×17): 81 mg via ORAL
  Filled 2016-07-02 (×17): qty 1

## 2016-07-02 MED ORDER — ROSUVASTATIN CALCIUM 10 MG PO TABS
5.0000 mg | ORAL_TABLET | Freq: Every day | ORAL | Status: DC
  Administered 2016-07-02 – 2016-07-18 (×17): 5 mg via ORAL
  Filled 2016-07-02 (×17): qty 1

## 2016-07-02 MED ORDER — IOPAMIDOL (ISOVUE-370) INJECTION 76%
INTRAVENOUS | Status: AC
  Administered 2016-07-02: 50 mL
  Filled 2016-07-02: qty 50

## 2016-07-02 NOTE — Progress Notes (Signed)
Patient ID: [name] [name], male   DOB: 03/26/1932, 82 y.o.   MRN: [id number]    Advanced Heart Failure Rounding Note   Subjective:    Moved to ICU and given IV lasix and started on milrinone with NYHA IV symptoms.  Diuresed well. PICC placed.  Milrinone increased to 0.25 yesterday, co-ox 62% this morning.  SBP around 120.   He remains in atrial fibrillation with HR 90s-100s on amiodarone gtt and heparin gtt.    No complaints this morning, not dyspneic.    CVP 6, on po Lasix.   TEE (8/17): EF 30-35%, severe AS, moderate to severe MR (suspect functional MR).   Carotid dopplers > 80% RICA stenosis.   RHC/LHC 06/17/16 RA 11 PCWP 25  CO/CI 3.25/1.4   Ost Cx to Prox Cx lesion, 50 %stenosed.  Mid Cx to Dist Cx lesion, 80 %stenosed.  Mid RCA lesion, 40 %stenosed.  Mid LAD lesion, 75 %stenosed.  2nd Diag lesion, 75 %stenosed.  Hemodynamic findings consistent with moderate pulmonary hypertension.  Unable to cross the aortic valve.  PA sat 52%. CO 3.2 L/min.   Objective:   Weight Range:  Vital Signs:   Temp:  [97.5 F (36.4 C)-98.4 F (36.9 C)] 98.4 F (36.9 C) (08/18 2300) Pulse Rate:  [47-112] 51 (08/19 0700) Resp:  [10-33] 13 (08/19 0700) BP: (94-136)/(42-92) 126/42 (08/19 0700) SpO2:  [94 %-100 %] 100 % (08/19 0700) Weight:  [212 lb 6.4 oz (96.3 kg)] 212 lb 6.4 oz (96.3 kg) (08/19 0600) Last BM Date: 07/01/16  Weight change: Filed Weights   06/30/16 1000 07/01/16 0800 07/02/16 0600  Weight: 210 lb 12.2 oz (95.6 kg) 207 lb 0.2 oz (93.9 kg) 212 lb 6.4 oz (96.3 kg)    Intake/Output:   Intake/Output Summary (Last 24 hours) at 07/02/16 0845 Last data filed at 07/02/16 0700  Gross per 24 hour  Intake           1581.7 ml  Output             1621 ml  Net            -39.3 ml     Physical Exam: CVP 6 General:  Dyspneic at rest. In bed HEENT: normal Neck: supple. JVP flat. Carotids 2+ bilat; no bruits. No thyromegaly or nodule noted. Cor: PMI nondisplaced.  Irregular RVR ate & rhythm. No S3.  2/6 systolic crescendo-decrescendo murmur with muffling of S2.   Lungs: Decreased in bases on 4 liters Gratis Abdomen: soft, NT, ND, no HSM. No bruits or masses. +BS  Extremities: no cyanosis, clubbing, rash,  R and LLE no edema.  Neuro: alert & orientedx3, cranial nerves grossly intact. moves all 4 extremities w/o difficulty. Affect pleasant  Telemetry: Reviewed personally, 90s-100s  Labs: Basic Metabolic Panel:  Recent Labs Lab 06/28/16 1316 06/29/16 0405 06/30/16 1315 07/01/16 0254 07/02/16 0440  NA 137 137  --  136 136  K 3.1* 3.1*  --  4.2 4.3  CL 99* 97*  --  97* 96*  CO2 28 29  --  30 28  GLUCOSE 118* 148*  --  157* 204*  BUN 19 20  --  18 19  CREATININE 1.10 1.16  --  1.27* 1.08  CALCIUM 9.3 9.1  --  9.5 9.2  MG  --   --  1.9  --   --     Liver Function Tests:  Recent Labs Lab 06/28/16 1316  AST 24  ALT 27  ALKPHOS 72  BILITOT 1.7*  PROT 7.1  ALBUMIN 3.9   No results for input(s): LIPASE, AMYLASE in the last 168 hours. No results for input(s): AMMONIA in the last 168 hours.  CBC:  Recent Labs Lab 06/28/16 1316 07/01/16 0254 07/02/16 0440  WBC 6.8 10.1 8.1  NEUTROABS 5.1  --   --   HGB 15.2 14.0 13.6  HCT 44.6 42.5 41.1  MCV 89.6 90.8 90.5  PLT 266 272 230    Cardiac Enzymes: No results for input(s): CKTOTAL, CKMB, CKMBINDEX, TROPONINI in the last 168 hours.  BNP: BNP (last 3 results)  Recent Labs  05/03/16 1305 05/28/16 1617 06/28/16 1316  BNP 232.0* 294.0* 222.0*    ProBNP (last 3 results) No results for input(s): PROBNP in the last 8760 hours.    Other results:  Imaging: Dg Chest Port 1 View  Result Date: 06/30/2016 CLINICAL DATA:  Central line placement EXAM: PORTABLE CHEST 1 VIEW COMPARISON:  06/30/2016 FINDINGS: Cardiomediastinal silhouette is stable. Mild congestion/pulmonary edema again noted. There is right arm PICC line with tip in right atrium. No pneumothorax. IMPRESSION: Right arm  PICC line with tip in right atrium. No pneumothorax. Persistent mild congestion/ pulmonary edema. Electronically Signed   By: Lahoma Crocker M.D.   On: 06/30/2016 16:20   Dg Chest Port 1 View  Result Date: 06/30/2016 CLINICAL DATA:  Shortness of breath today. EXAM: PORTABLE CHEST 1 VIEW COMPARISON:  Chest x-rays dated 06/28/2016 and 05/28/2016. FINDINGS: Mild cardiomegaly is stable. There is central pulmonary vascular congestion without overt alveolar pulmonary edema. Suspect small right pleural effusion and mild bibasilar atelectasis. No pneumothorax seen. Atherosclerotic changes noted at the aortic arch. Osseous structures about the chest are unremarkable. IMPRESSION: 1. Cardiomegaly with central pulmonary vascular congestion suggesting mild CHF/volume overload. 2. Cardiomegaly is stable. 3. Probable small right pleural effusion. 4. Aortic atherosclerosis. Electronically Signed   By: Franki Cabot M.D.   On: 06/30/2016 13:37     Medications:     Scheduled Medications: . antiseptic oral rinse  7 mL Mouth Rinse BID  . aspirin  81 mg Oral Daily  . digoxin  0.125 mg Oral Daily  . furosemide  40 mg Oral Daily  . insulin aspart  0-9 Units Subcutaneous TID WC  . potassium chloride  40 mEq Oral BID  . rosuvastatin  5 mg Oral q1800  . sodium chloride flush  10-40 mL Intracatheter Q12H  . sodium chloride flush  3 mL Intravenous Q12H  . traZODone  100 mg Oral QHS    Infusions: . amiodarone 30 mg/hr (07/02/16 0458)  . heparin 1,400 Units/hr (07/02/16 0458)  . milrinone 0.25 mcg/kg/min (07/02/16 0200)    PRN Medications: sodium chloride, acetaminophen **OR** acetaminophen, bisacodyl, HYDROcodone-acetaminophen, nortriptyline, ondansetron **OR** ondansetron (ZOFRAN) IV, sodium chloride flush, sodium chloride flush   Assessment:   1. Cardiogenic Shock- TEE with EF 30-35% 2. Acute Respiratory Failure 3. A fib RVR 4. CAD -  2 vessel disease 5. Valvular Heart Disease-  Severe AS/moderate-severe MR  (MR may be functional).  6. Caroltid Stenosis- Severe carotid stenosis RICA >80% stenosis   Plan/Discussion:    CVP 6, now on po Lasix.  Co-ox 62% now on milrinone 0.25.  Will continue.  He is on digoxin.   HR 90s-100s on amiodarone gtt for rate control + heparin gtt.    2 vessel CAD, on ASA 81 and will add low dose Crestor (prior myalgias with simvastatin).   No BB with low output. May be able to start low  dose ACEI soon if creatinine remains stable.   He is too high risk for conventional surgery.   We may be able to get him to TAVR.  However, prior to TAVR he would need RICA severe stenosis addressed.  Suspect that this would have to be by carotid stenting. He has been seen by valvular surgery.  I am going to go ahead and order CTA head/neck as recommended by vascular surgery today to assess RICA stenosis.     Work with PT/cardiac rehab.   Length of Stay: [place]  07/02/2016, 8:45 AM

## 2016-07-02 NOTE — Progress Notes (Signed)
ANTICOAGULATION CONSULT NOTE - Follow Up Consult  Pharmacy Consult for Heparin Indication: atrial fibrillation  Allergies  Allergen Reactions  . Ambien [Zolpidem Tartrate] Other (See Comments)    Sleep walks  . Cholestatin   . Lipitor [Atorvastatin Calcium] Other (See Comments)    myalgias  . Ranitidine Other (See Comments)    Chest discomfort  . Simvastatin Other (See Comments)    Myalgias  . Xanax Xr [Alprazolam Er]     Tightness in chest    Patient Measurements: Height: 6\' 1"  (185.4 cm) Weight: 212 lb 6.4 oz (96.3 kg) IBW/kg (Calculated) : 79.9  Vital Signs: Temp: 98 F (36.7 C) (08/19 0800) Temp Source: Oral (08/19 0800) BP: 126/53 (08/19 0800) Pulse Rate: 84 (08/19 0800)  Labs:  Recent Labs  06/30/16 0404 07/01/16 0254 07/01/16 1200 07/02/16 0440  HGB  --  14.0  --  13.6  HCT  --  42.5  --  41.1  PLT  --  272  --  230  LABPROT 18.5*  --   --   --   INR 1.53  --   --   --   HEPARINUNFRC  --  0.42 0.48 0.49  CREATININE  --  1.27*  --  1.08    Estimated Creatinine Clearance: 65.6 mL/min (by C-G formula based on SCr of 1.08 mg/dL).  Medications: Heparin @ 1400 units/hr  Assessment: 83yom continues on heparin for afib (while coumadin on hold) in the setting of cardiogenic shock. Heparin level remains therapeutic at 0.49. CBC is stable. No bleeding.  Goal of Therapy:  Heparin level 0.3-0.7 units/ml Monitor platelets by anticoagulation protocol: Yes   Plan:  1) Continue heparin at 1400 units/hr 2) Daily heparin level, CBC   Jahdiel Krol K. Velva Harman, PharmD, BCPS, CPP Clinical Pharmacist Pager: [phone] Phone: [phone] 07/02/2016 10:15 AM

## 2016-07-03 LAB — CBC
HCT: 39.3 % (ref 39.0–52.0)
HEMOGLOBIN: 12.7 g/dL — AB (ref 13.0–17.0)
MCH: 29.2 pg (ref 26.0–34.0)
MCHC: 32.3 g/dL (ref 30.0–36.0)
MCV: 90.3 fL (ref 78.0–100.0)
Platelets: 205 10*3/uL (ref 150–400)
RBC: 4.35 MIL/uL (ref 4.22–5.81)
RDW: 14.8 % (ref 11.5–15.5)
WBC: 6.4 10*3/uL (ref 4.0–10.5)

## 2016-07-03 LAB — GLUCOSE, CAPILLARY
Glucose-Capillary: 156 mg/dL — ABNORMAL HIGH (ref 65–99)
Glucose-Capillary: 186 mg/dL — ABNORMAL HIGH (ref 65–99)
Glucose-Capillary: 140 mg/dL — ABNORMAL HIGH (ref 65–99)
Glucose-Capillary: 169 mg/dL — ABNORMAL HIGH (ref 65–99)

## 2016-07-03 LAB — BASIC METABOLIC PANEL
Anion gap: 10 (ref 5–15)
BUN: 17 mg/dL (ref 6–20)
CHLORIDE: 95 mmol/L — AB (ref 101–111)
CO2: 30 mmol/L (ref 22–32)
CREATININE: 1.23 mg/dL (ref 0.61–1.24)
Calcium: 9.2 mg/dL (ref 8.9–10.3)
GFR calc Af Amer: >60 mL/min (ref >60)
GFR calc non Af Amer: 53 mL/min — ABNORMAL LOW (ref >60)
Glucose, Bld: 189 mg/dL — ABNORMAL HIGH (ref 65–99)
Potassium: 4.6 mmol/L (ref 3.5–5.1)
SODIUM: 135 mmol/L (ref 135–145)

## 2016-07-03 LAB — CARBOXYHEMOGLOBIN
Carboxyhemoglobin: 1.6 % — ABNORMAL HIGH (ref 0.5–1.5)
METHEMOGLOBIN: 0.7 % (ref 0.0–1.5)
O2 Saturation: 67.7 %
Total hemoglobin: 13 g/dL — ABNORMAL LOW (ref 13.5–18.0)

## 2016-07-03 LAB — HEPARIN LEVEL (UNFRACTIONATED): HEPARIN UNFRACTIONATED: 0.4 [IU]/mL (ref 0.30–0.70)

## 2016-07-03 NOTE — Progress Notes (Signed)
Patient ID: [name] [name], male   DOB: 1932-08-21, 82 y.o.   MRN: [id number]    Advanced Heart Failure Rounding Note   Subjective:    Moved to ICU and given IV lasix and started on milrinone with NYHA IV symptoms.  Diuresed well. PICC placed.  Milrinone increased to 0.25, co-ox 68% this morning.  BP stable.    He remains in atrial fibrillation with HR 80s-100s on amiodarone gtt and heparin gtt.    No complaints this morning, not dyspneic.    CVP 4, on po Lasix.   TEE (8/17): EF 30-35%, severe AS, moderate to severe MR (suspect functional MR).   Carotid dopplers > 80% RICA stenosis.  CTA head/neck with >90% (critical) RICA stenosis and [id number] LICA.   RHC/LHC 06/17/16 RA 11 PCWP 25  CO/CI 3.25/1.4   Ost Cx to Prox Cx lesion, 50 %stenosed.  Mid Cx to Dist Cx lesion, 80 %stenosed.  Mid RCA lesion, 40 %stenosed.  Mid LAD lesion, 75 %stenosed.  2nd Diag lesion, 75 %stenosed.  Hemodynamic findings consistent with moderate pulmonary hypertension.  Unable to cross the aortic valve.  PA sat 52%. CO 3.2 L/min.   Objective:   Weight Range:  Vital Signs:   Temp:  [97.3 F (36.3 C)-98 F (36.7 C)] 97.5 F (36.4 C) (08/20 0400) Pulse Rate:  [42-103] 42 (08/20 0600) Resp:  [11-25] 24 (08/19 1700) BP: (104-161)/(47-104) 132/52 (08/20 0600) SpO2:  [93 %-100 %] 96 % (08/20 0600) Weight:  [211 lb 8 oz (95.9 kg)] 211 lb 8 oz (95.9 kg) (08/20 0500) Last BM Date: 07/01/16  Weight change: Filed Weights   07/01/16 0800 07/02/16 0600 07/03/16 0500  Weight: 207 lb 0.2 oz (93.9 kg) 212 lb 6.4 oz (96.3 kg) 211 lb 8 oz (95.9 kg)    Intake/Output:   Intake/Output Summary (Last 24 hours) at 07/03/16 0715 Last data filed at 07/03/16 0600  Gross per 24 hour  Intake           2031.7 ml  Output             1925 ml  Net            106.7 ml     Physical Exam: CVP 4 General:  Dyspneic at rest. In bed HEENT: normal Neck: supple. JVP flat. Carotids 2+ bilat; no bruits. No thyromegaly  or nodule noted. Cor: PMI nondisplaced. Irregular RVR ate & rhythm. No S3.  2/6 systolic crescendo-decrescendo murmur with muffling of S2.   Lungs: Decreased in bases on 4 liters [place] Abdomen: soft, NT, ND, no HSM. No bruits or masses. +BS  Extremities: no cyanosis, clubbing, rash,  R and LLE no edema.  Neuro: alert & orientedx3, cranial nerves grossly intact. moves all 4 extremities w/o difficulty. Affect pleasant  Telemetry: Reviewed personally, atrial fibrillation 80s-100s  Labs: Basic Metabolic Panel:  Recent Labs Lab 06/28/16 1316 06/29/16 0405 06/30/16 1315 07/01/16 0254 07/02/16 0440 07/03/16 0446  NA 137 137  --  136 136 135  K 3.1* 3.1*  --  4.2 4.3 4.6  CL 99* 97*  --  97* 96* 95*  CO2 28 29  --  30 28 30   GLUCOSE 118* 148*  --  157* 204* 189*  BUN 19 20  --  18 19 17   CREATININE 1.10 1.16  --  1.27* 1.08 1.23  CALCIUM 9.3 9.1  --  9.5 9.2 9.2  MG  --   --  1.9  --   --   --  Liver Function Tests:  Recent Labs Lab 06/28/16 1316  AST 24  ALT 27  ALKPHOS 72  BILITOT 1.7*  PROT 7.1  ALBUMIN 3.9   No results for input(s): LIPASE, AMYLASE in the last 168 hours. No results for input(s): AMMONIA in the last 168 hours.  CBC:  Recent Labs Lab 06/28/16 1316 07/01/16 0254 07/02/16 0440 07/03/16 0446  WBC 6.8 10.1 8.1 6.4  NEUTROABS 5.1  --   --   --   HGB 15.2 14.0 13.6 12.7*  HCT 44.6 42.5 41.1 39.3  MCV 89.6 90.8 90.5 90.3  PLT 266 272 230 205    Cardiac Enzymes: No results for input(s): CKTOTAL, CKMB, CKMBINDEX, TROPONINI in the last 168 hours.  BNP: BNP (last 3 results)  Recent Labs  05/03/16 1305 05/28/16 1617 06/28/16 1316  BNP 232.0* 294.0* 222.0*    ProBNP (last 3 results) No results for input(s): PROBNP in the last 8760 hours.    Other results:  Imaging: Ct Angio Head W Or Wo Contrast  Result Date: 07/02/2016 CLINICAL DATA:  Carotid stenosis EXAM: CT ANGIOGRAPHY HEAD AND NECK TECHNIQUE: Multidetector CT imaging of the head  and neck was performed using the standard protocol during bolus administration of intravenous contrast. Multiplanar CT image reconstructions and MIPs were obtained to evaluate the vascular anatomy. Carotid stenosis measurements (when applicable) are obtained utilizing NASCET criteria, using the distal internal carotid diameter as the denominator. CONTRAST:  50 mL Isovue 370 COMPARISON:  MRI brain 04/03/2012, head CT 10/01/2015. FINDINGS: CT HEAD Brain: No mass lesion, intraparenchymal hemorrhage or extra-axial collection. No evidence of acute infarct. No midline shift or hydrocephalus. There is confluent periventricular hypoattenuation compatible with chronic microvascular disease. Left temporal encephalomalacia is unchanged. Old right cerebellar infarct. Ventricles and sulci are normal for patient age. Calvarium and skull base: The skull is normal. Visualized extracranial soft tissues are normal. Paranasal sinuses: Normal Orbits: Normal CTA HEAD Intracranial internal carotid arteries: Mild atherosclerotic calcification at the cavernous and clinoid segments. Anterior cerebral arteries: Normal Middle cerebral arteries: Normal. Posterior communicating arteries: Present on the right. Absent on the left. Posterior cerebral arteries: Normal. Basilar artery: Normal. Vertebral arteries: Right-dominant. There is loss of the normal opacification of the left vertebral artery V4 segment distal to the origin of the PICA. Superior cerebellar arteries: Normal. Anterior inferior cerebellar arteries: Not clearly visualized. Posterior inferior cerebellar arteries: Normal. Venous sinuses: Normal Anatomic variants: None Delayed phase: Unremarkable CTA NECK Aortic arch: There is mild atherosclerotic calcification within the aortic arch and proximal great vessels. Normal 3 vessel configuration. Moderate narrowing of the proximal left common carotid artery secondary to atherosclerotic plaque. Both subclavian arteries are patent. Right  carotid system: There is atherosclerotic plaque at the right carotid bifurcation resulting in near complete common greater than 90% stenosis. This stenosis covers only a short segment of the artery in the remainder of the right internal carotid artery is normal. Left carotid system: As above, moderate atherosclerotic narrowing of the proximal left common carotid artery. There is atherosclerotic plaquing calcification at the left carotid bifurcation resulting in stenosis of approximately 70%. The remainder of the course of the left internal carotid artery is normal. Vertebral arteries:There is atherosclerotic plaque at the origin of the right vertebral artery, but the origin remains clearly patent. The course and caliber of the right vertebral artery are normal without stenosis. There is atherosclerotic calcification of the origin of the left vertebral artery without significant stenosis. The left vertebral artery is diminutive along its entire course.  Skeleton: Multilevel facet arthrosis and osteophytosis. No lytic or blastic osseous lesions. Other neck: Numerous scattered subcentimeter mediastinal lymph nodes. Parotid and submandibular glands are normal. The larynx is normal. There is fullness within the left fossa of Rosenmuller. Unremarkable thyroid Upper chest: There are large bilateral pleural effusions and biapical emphysema. No nodules or masses. IMPRESSION: 1. Critical stenosis of the proximal right internal carotid artery, secondary to the presence of atherosclerotic plaque. 2. Moderate to severe stenosis of the proximal left internal carotid artery, measuring approximately 70%. 3. **An incidental finding of potential clinical significance has been found. There is fullness within the left fossa of Rosenmuller, which could indicate the presence of a nasopharyngeal mass. Recommend correlation with direct visualization.** 4. Severe narrowing of the distal V4 segment of the left vertebral artery, distal to the  PICA origin. 5. No intracranial occlusion or advanced stenosis. 6. No acute intracranial abnormality. 7. Large bilateral pleural effusions. Electronically Signed   By: Ulyses Jarred M.D.   On: 07/02/2016 22:59   Ct Angio Neck W Or Wo Contrast  Result Date: 07/02/2016 CLINICAL DATA:  Carotid stenosis EXAM: CT ANGIOGRAPHY HEAD AND NECK TECHNIQUE: Multidetector CT imaging of the head and neck was performed using the standard protocol during bolus administration of intravenous contrast. Multiplanar CT image reconstructions and MIPs were obtained to evaluate the vascular anatomy. Carotid stenosis measurements (when applicable) are obtained utilizing NASCET criteria, using the distal internal carotid diameter as the denominator. CONTRAST:  50 mL Isovue 370 COMPARISON:  MRI brain 04/03/2012, head CT 10/01/2015. FINDINGS: CT HEAD Brain: No mass lesion, intraparenchymal hemorrhage or extra-axial collection. No evidence of acute infarct. No midline shift or hydrocephalus. There is confluent periventricular hypoattenuation compatible with chronic microvascular disease. Left temporal encephalomalacia is unchanged. Old right cerebellar infarct. Ventricles and sulci are normal for patient age. Calvarium and skull base: The skull is normal. Visualized extracranial soft tissues are normal. Paranasal sinuses: Normal Orbits: Normal CTA HEAD Intracranial internal carotid arteries: Mild atherosclerotic calcification at the cavernous and clinoid segments. Anterior cerebral arteries: Normal Middle cerebral arteries: Normal. Posterior communicating arteries: Present on the right. Absent on the left. Posterior cerebral arteries: Normal. Basilar artery: Normal. Vertebral arteries: Right-dominant. There is loss of the normal opacification of the left vertebral artery V4 segment distal to the origin of the PICA. Superior cerebellar arteries: Normal. Anterior inferior cerebellar arteries: Not clearly visualized. Posterior inferior  cerebellar arteries: Normal. Venous sinuses: Normal Anatomic variants: None Delayed phase: Unremarkable CTA NECK Aortic arch: There is mild atherosclerotic calcification within the aortic arch and proximal great vessels. Normal 3 vessel configuration. Moderate narrowing of the proximal left common carotid artery secondary to atherosclerotic plaque. Both subclavian arteries are patent. Right carotid system: There is atherosclerotic plaque at the right carotid bifurcation resulting in near complete common greater than 90% stenosis. This stenosis covers only a short segment of the artery in the remainder of the right internal carotid artery is normal. Left carotid system: As above, moderate atherosclerotic narrowing of the proximal left common carotid artery. There is atherosclerotic plaquing calcification at the left carotid bifurcation resulting in stenosis of approximately 70%. The remainder of the course of the left internal carotid artery is normal. Vertebral arteries:There is atherosclerotic plaque at the origin of the right vertebral artery, but the origin remains clearly patent. The course and caliber of the right vertebral artery are normal without stenosis. There is atherosclerotic calcification of the origin of the left vertebral artery without significant stenosis. The left vertebral artery is  diminutive along its entire course. Skeleton: Multilevel facet arthrosis and osteophytosis. No lytic or blastic osseous lesions. Other neck: Numerous scattered subcentimeter mediastinal lymph nodes. Parotid and submandibular glands are normal. The larynx is normal. There is fullness within the left fossa of Rosenmuller. Unremarkable thyroid Upper chest: There are large bilateral pleural effusions and biapical emphysema. No nodules or masses. IMPRESSION: 1. Critical stenosis of the proximal right internal carotid artery, secondary to the presence of atherosclerotic plaque. 2. Moderate to severe stenosis of the proximal  left internal carotid artery, measuring approximately 70%. 3. **An incidental finding of potential clinical significance has been found. There is fullness within the left fossa of Rosenmuller, which could indicate the presence of a nasopharyngeal mass. Recommend correlation with direct visualization.** 4. Severe narrowing of the distal V4 segment of the left vertebral artery, distal to the PICA origin. 5. No intracranial occlusion or advanced stenosis. 6. No acute intracranial abnormality. 7. Large bilateral pleural effusions. Electronically Signed   By: Ulyses Jarred M.D.   On: 07/02/2016 22:59     Medications:     Scheduled Medications: . antiseptic oral rinse  7 mL Mouth Rinse BID  . aspirin  81 mg Oral Daily  . digoxin  0.125 mg Oral Daily  . furosemide  40 mg Oral Daily  . insulin aspart  0-9 Units Subcutaneous TID WC  . potassium chloride  40 mEq Oral BID  . rosuvastatin  5 mg Oral q1800  . sodium chloride flush  10-40 mL Intracatheter Q12H  . sodium chloride flush  3 mL Intravenous Q12H  . traZODone  100 mg Oral QHS    Infusions: . amiodarone 30 mg/hr (07/03/16 0600)  . heparin 1,400 Units/hr (07/03/16 0600)  . milrinone 0.25 mcg/kg/min (07/03/16 0600)    PRN Medications: sodium chloride, acetaminophen **OR** acetaminophen, bisacodyl, HYDROcodone-acetaminophen, nortriptyline, ondansetron **OR** ondansetron (ZOFRAN) IV, sodium chloride flush, sodium chloride flush   Assessment:   1. Cardiogenic Shock- TEE with EF 30-35% 2. Acute Respiratory Failure 3. A fib RVR 4. CAD -  2 vessel disease 5. Valvular Heart Disease-  Severe AS/moderate-severe MR (MR may be functional).  6. Caroltid Stenosis- Severe carotid stenosis RICA >80% stenosis by carotid dopplers, CTA head/neck with critical >90% RICA stenosis an [id number] LICA.    Plan/Discussion:    CVP 4, now on po Lasix.  Co-ox 68% now on milrinone 0.25.  Will continue.  He is on digoxin.   HR 80s-100s on amiodarone gtt for rate  control + heparin gtt.    2 vessel CAD, on ASA 81 and added low dose Crestor (prior myalgias with simvastatin).   No BB with low output. May be able to start low dose ACEI soon if creatinine remains stable.   He is too high risk for conventional surgery (low gradient severe AS and moderate to severe probably functional MR).   He is being evaluated for TAVR.  However, prior to TAVR he would need RICA severe stenosis addressed.  Suspect that this would have to be by carotid stenting. He has been seen by valvular surgery.  CTA head/neck on 8/19 showed critical RICA stenosis. Will need discussion with Dr Roxy Manns and vascular MD tomorrow.   Work with PT/cardiac rehab. Get out of bed today.   Length of Stay: [place]  07/03/2016, 7:15 AM

## 2016-07-03 NOTE — Evaluation (Signed)
Physical Therapy Evaluation Patient Details Name: [name] [name] MRN: [id number] DOB: 02-May-1932 Today's Date: 07/03/2016   History of Present Illness  Patient is an 82 yo male admitted 06/28/16 with marked DOE.  Patient with cardiogenic shock, acute respiratory failure, CAD-2 vessel disease, valvular disease - severe AS/severe MR, carotid stenosis - RICA stenosis 80-90, Afib with RVR.      PMH:  CAD, HTN, HLD, DM, systolic CHF w reduced EF 35-40%, carotid stenosis and aortic valve stenosis, TIA/CVA, Afib, CKD, DJD, PVD    Clinical Impression  Patient presents with problems listed below.  Will benefit from acute PT to maximize functional mobility prior to discharge.  At this point, recommend HHPT for continued therapy at d/c.  Will need to continue to assess as medical/ surgical plan evolves.    Follow Up Recommendations Home health PT;Supervision for mobility/OOB (Will continue to assess based on medical/surgical plan)    Equipment Recommendations  None recommended by PT    Recommendations for Other Services       Precautions / Restrictions Precautions Precautions: Fall Restrictions Weight Bearing Restrictions: No      Mobility  Bed Mobility Overal bed mobility: Needs Assistance Bed Mobility: Supine to Sit;Sit to Supine     Supine to sit: Min guard;+2 for safety/equipment Sit to supine: Min assist;+2 for physical assistance   General bed mobility comments: Verbal cues for technique.  Assist to bring LE's onto bed and control trunk descent.    Transfers Overall transfer level: Needs assistance Equipment used: 2 person hand held assist Transfers: Sit to/from Stand Sit to Stand: Min assist;+2 physical assistance         General transfer comment: Assist to steady and for lines.  Good balance in static stance.  Ambulation/Gait Ambulation/Gait assistance: Min assist;+2 physical assistance;+2 safety/equipment Ambulation Distance (Feet): 140 Feet Assistive device: 2 person  hand held assist Gait Pattern/deviations: Step-through pattern;Decreased step length - right;Decreased step length - left;Decreased stride length;Shuffle;Drifts right/left Gait velocity: decreased Gait velocity interpretation: Below normal speed for age/gender General Gait Details: Patient with slightly unsteady gait, taking short shuffling steps.  Assist for balance.  Patient ambulated on room air with O2 sats remaining 94-100%, HR increased to 123.  Stairs            Wheelchair Mobility    Modified Rankin (Stroke Patients Only)       Balance Overall balance assessment: Needs assistance         Standing balance support: Single extremity supported;During functional activity Standing balance-Leahy Scale: Poor                               Pertinent Vitals/Pain Pain Assessment: No/denies pain    Home Living Family/patient expects to be discharged to:: Private residence Living Arrangements: Spouse/significant other Available Help at Discharge: Family;Available 24 hours/day Type of Home: House       Home Layout: One level Home Equipment: [place] - 2 wheels;Cane - single point;Wheelchair - manual      Prior Function Level of Independence: Independent with assistive device(s)         Comments: Uses cane occasionally     Hand Dominance        Extremity/Trunk Assessment   Upper Extremity Assessment: Overall WFL for tasks assessed           Lower Extremity Assessment: Generalized weakness         Communication   Communication: No difficulties  Cognition Arousal/Alertness: Awake/alert Behavior During Therapy: WFL for tasks assessed/performed Overall Cognitive Status: Within Functional Limits for tasks assessed                      General Comments      Exercises        Assessment/Plan    PT Assessment Patient needs continued PT services  PT Diagnosis Abnormality of gait;Generalized weakness   PT Problem List  Decreased strength;Decreased activity tolerance;Decreased balance;Decreased mobility;Decreased knowledge of use of DME;Cardiopulmonary status limiting activity  PT Treatment Interventions DME instruction;Gait training;Functional mobility training;Therapeutic activities;Balance training;Patient/family education   PT Goals (Current goals can be found in the Care Plan section) Acute Rehab PT Goals Patient Stated Goal: To get better PT Goal Formulation: With patient Time For Goal Achievement: 07/17/16 Potential to Achieve Goals: Good    Frequency Min 3X/week   Barriers to discharge        Co-evaluation               End of Session   Activity Tolerance: Patient tolerated treatment well;Patient limited by fatigue Patient left: in bed;with call bell/phone within reach;with nursing/sitter in room Nurse Communication: Mobility status (O2 sats and HR with gait)         Time: [id number] PT Time Calculation (min) (ACUTE ONLY): 28 min   Charges:   PT Evaluation $PT Eval High Complexity: 1 Procedure PT Treatments $Gait Training: 8-22 mins   PT G CodesDespina Pole 07-13-2016, 5:36 PM Carita Pian. Sanjuana Kava, [place] Pager [phone]

## 2016-07-03 NOTE — Progress Notes (Signed)
ANTICOAGULATION CONSULT NOTE - Follow Up Consult  Pharmacy Consult for Heparin Indication: atrial fibrillation  Allergies  Allergen Reactions  . Ambien [Zolpidem Tartrate] Other (See Comments)    Sleep walks  . Cholestatin   . Lipitor [Atorvastatin Calcium] Other (See Comments)    myalgias  . Ranitidine Other (See Comments)    Chest discomfort  . Simvastatin Other (See Comments)    Myalgias  . Xanax Xr [Alprazolam Er]     Tightness in chest    Patient Measurements: Height: 6\' 1"  (185.4 cm) Weight: 211 lb 8 oz (95.9 kg) IBW/kg (Calculated) : 79.9  Vital Signs: Temp: 97.5 F (36.4 C) (08/20 0400) Temp Source: Oral (08/20 0400) BP: 132/52 (08/20 0600) Pulse Rate: 42 (08/20 0600)  Labs:  Recent Labs  07/01/16 0254 07/01/16 1200 07/02/16 0440 07/03/16 0446 07/03/16 0447  HGB 14.0  --  13.6 12.7*  --   HCT 42.5  --  41.1 39.3  --   PLT 272  --  230 205  --   HEPARINUNFRC 0.42 0.48 0.49  --  0.40  CREATININE 1.27*  --  1.08 1.23  --     Estimated Creatinine Clearance: 57.5 mL/min (by C-G formula based on SCr of 1.23 mg/dL).  Medications: Heparin @ 1400 units/hr  Assessment: 83yom continues on heparin for afib (while coumadin on hold) in the setting of cardiogenic shock. Heparin level remains therapeutic at 0.40. H/H and plt remain wnl but slowly trending down. No bleeding reported.  Goal of Therapy:  Heparin level 0.3-0.7 units/ml Monitor platelets by anticoagulation protocol: Yes   Plan:  1) Continue heparin at 1400 units/hr 2) Daily heparin level, CBC   Dmarcus Decicco K. Velva Harman, PharmD, BCPS, CPP Clinical Pharmacist Pager: [phone] Phone: [phone] 07/03/2016 8:05 AM

## 2016-07-04 ENCOUNTER — Other Ambulatory Visit: Payer: Self-pay | Admitting: *Deleted

## 2016-07-04 DIAGNOSIS — I350 Nonrheumatic aortic (valve) stenosis: Secondary | ICD-10-CM

## 2016-07-04 LAB — CARBOXYHEMOGLOBIN
CARBOXYHEMOGLOBIN: 1.7 % — AB (ref 0.5–1.5)
METHEMOGLOBIN: 0.7 % (ref 0.0–1.5)
O2 SAT: 82 %
Total hemoglobin: 12.9 g/dL — ABNORMAL LOW (ref 13.5–18.0)

## 2016-07-04 LAB — GLUCOSE, CAPILLARY
GLUCOSE-CAPILLARY: 167 mg/dL — AB (ref 65–99)
Glucose-Capillary: 215 mg/dL — ABNORMAL HIGH (ref 65–99)
Glucose-Capillary: 168 mg/dL — ABNORMAL HIGH (ref 65–99)
Glucose-Capillary: 181 mg/dL — ABNORMAL HIGH (ref 65–99)

## 2016-07-04 LAB — BASIC METABOLIC PANEL
ANION GAP: 9 (ref 5–15)
BUN: 13 mg/dL (ref 6–20)
CALCIUM: 9.2 mg/dL (ref 8.9–10.3)
CHLORIDE: 96 mmol/L — AB (ref 101–111)
CO2: 28 mmol/L (ref 22–32)
Creatinine, Ser: 1.11 mg/dL (ref 0.61–1.24)
GFR calc non Af Amer: 60 mL/min — ABNORMAL LOW (ref >60)
GLUCOSE: 273 mg/dL — AB (ref 65–99)
Potassium: 4.5 mmol/L (ref 3.5–5.1)
Sodium: 133 mmol/L — ABNORMAL LOW (ref 135–145)

## 2016-07-04 LAB — CBC
HEMATOCRIT: 39.2 % (ref 39.0–52.0)
HEMOGLOBIN: 12.8 g/dL — AB (ref 13.0–17.0)
MCH: 29.6 pg (ref 26.0–34.0)
MCHC: 32.7 g/dL (ref 30.0–36.0)
MCV: 90.5 fL (ref 78.0–100.0)
Platelets: 199 10*3/uL (ref 150–400)
RBC: 4.33 MIL/uL (ref 4.22–5.81)
RDW: 14.8 % (ref 11.5–15.5)
WBC: 5.5 10*3/uL (ref 4.0–10.5)

## 2016-07-04 LAB — HEPARIN LEVEL (UNFRACTIONATED): HEPARIN UNFRACTIONATED: 0.53 [IU]/mL (ref 0.30–0.70)

## 2016-07-04 MED ORDER — SODIUM CHLORIDE 0.9 % IV BOLUS (SEPSIS): 500.0000 mL | Freq: Once | INTRAVENOUS | Status: DC

## 2016-07-04 MED ORDER — ATROPINE SULFATE 1 MG/10ML IJ SOSY: 0.5000 mg | PREFILLED_SYRINGE | Freq: Once | INTRAMUSCULAR | Status: DC

## 2016-07-04 NOTE — Progress Notes (Addendum)
Walked in hall being with one person on each side. No oxygen on. Sats 94%. Tolerated well.

## 2016-07-04 NOTE — Progress Notes (Signed)
PT Cancellation Note  Patient Details Name: [name] [name] MRN: [id number] DOB: 1932/05/04   Cancelled Treatment:    Reason Eval/Treat Not Completed: Other (comment) Pt finishing up with cardiac rehab and ambulated earlier with RN this AM. Will follow up next available time.  [name] 07/04/2016, 2:08 PM Wray Kearns, [place], DPT [phone]

## 2016-07-04 NOTE — Progress Notes (Signed)
ANTICOAGULATION CONSULT NOTE - Follow Up Consult  Pharmacy Consult for Heparin Indication: atrial fibrillation  Allergies  Allergen Reactions  . Ambien [Zolpidem Tartrate] Other (See Comments)    Sleep walks  . Cholestatin   . Lipitor [Atorvastatin Calcium] Other (See Comments)    myalgias  . Ranitidine Other (See Comments)    Chest discomfort  . Simvastatin Other (See Comments)    Myalgias  . Xanax Xr [Alprazolam Er]     Tightness in chest    Patient Measurements: Height: 6\' 1"  (185.4 cm) Weight: 207 lb 7.3 oz (94.1 kg) IBW/kg (Calculated) : 79.9  Vital Signs: Temp: 97.3 F (36.3 C) (08/21 0800) BP: 126/50 (08/21 0900) Pulse Rate: 96 (08/21 0900)  Labs:  Recent Labs  07/02/16 0440 07/03/16 0446 07/03/16 0447 07/04/16 0533 07/04/16 0534  HGB 13.6 12.7*  --   --  12.8*  HCT 41.1 39.3  --   --  39.2  PLT 230 205  --   --  199  HEPARINUNFRC 0.49  --  0.40 0.53  --   CREATININE 1.08 1.23  --   --  1.11    Estimated Creatinine Clearance: 58 mL/min (by C-G formula based on SCr of 1.11 mg/dL).  Medications: Heparin @ 1400 units/hr  Assessment: 83yom continues on heparin for afib (while coumadin on hold) in the setting of cardiogenic shock. Heparin level remains therapeutic at 0.53. CBC stable. No bleeding reported. Anticipating possible TAVR.  Goal of Therapy:  Heparin level 0.3-0.7 units/ml Monitor platelets by anticoagulation protocol: Yes   Plan:  1) Continue heparin at 1400 units/hr 2) Daily heparin level, CBC   Nena Jordan, PharmD, BCPS 07/04/2016 11:45 AM

## 2016-07-04 NOTE — Progress Notes (Addendum)
   VASCULAR SURGERY ASSESSMENT & PLAN:  This patient has a greater than 80% right carotid stenosis which may be symptomatic, although it is not clear. His history has been different several times. On my history, he denies any sudden left upper extremity or left lower extremity weakness or clear cut  right amaurosis fugax. Regardless he has a very tight right carotid stenosis.  I think there are 3 options. First, would be to consider carotid stenting. I have reviewed his CT angiogram of the neck and does have some calcium in the arch. I have asked my partner Dr. Oneida Alar to review these films to determine if he might be a candidate for carotid stenting. If he is a candidate, then this may be the safest option given his severe aortic stenosis. This could potentially be done prior to his TAVR. If he is not a candidate for carotid stenting, and I think the options would be to proceed with the TAVR and consider right carotid endarterectomy postoperatively. The the other option would be a right carotid endarterectomy prior to his TAVR but I think he would be at higher risk for cardiac complications perioperatively. Of note, he is on aspirin now. According to his family he was not on aspirin or Plavix prior to this admission.  He is currently on heparin, aspirin, and a statin. He is not on Plavix. I will follow.  The situation is further complicated by an incidental finding of a nasopharyngeal mass.  I suspect that he will need ENT to weigh in on this finding.  SUBJECTIVE: On my history he denies any sudden left sided weakness or paresthesias. He has had some visual disturbances but is not clear which I though he tells me that his ophthalmologist said the issues were with the left eye. His history is sometimes inconsistent.  PHYSICAL EXAM: Vitals:   07/04/16 0600 07/04/16 0700 07/04/16 0800 07/04/16 0900  BP: (!) 122/58 (!) 106/57 (!) 126/59 (!) 126/50  Pulse: (!) 58 (!) 47 91 96  Resp: 15 17 (!) 23 20    Temp:   97.3 F (36.3 C)   TempSrc:      SpO2: 99% 93% 96% 92%  Weight:      Height:       He has palpable femoral pulses and palpable posterior tibial pulses bilaterally. NEURO: He has no focal weakness or paresthesias.  LABS: Lab Results  Component Value Date   WBC 5.5 07/04/2016   HGB 12.8 (L) 07/04/2016   HCT 39.2 07/04/2016   MCV 90.5 07/04/2016   PLT 199 07/04/2016   Lab Results  Component Value Date   CREATININE 1.11 07/04/2016   Lab Results  Component Value Date   INR 1.53 06/30/2016   CBG (last 3)   Recent Labs  07/03/16 2118 07/04/16 0820 07/04/16 1112  GLUCAP 169* 167* 215*    Principal Problem:   Cardiogenic shock (HCC) Active Problems:   Hypertension   Cerebrovascular disease   Atrial fibrillation (HCC)   Hyperlipidemia   Aortic stenosis   Diabetes mellitus (HCC)   Carotid stenosis   Anemia of chronic disease   Systolic congestive heart failure (HCC)   Dyspnea   CAD (coronary artery disease), native coronary artery   Acute on chronic combined systolic and diastolic CHF, NYHA class 4 ([place])   Acute respiratory failure with hypoxia ([place])   Gae Gallop [id number] 07/04/2016

## 2016-07-04 NOTE — Progress Notes (Unsigned)
spiro

## 2016-07-04 NOTE — Progress Notes (Signed)
[facility] CommunitySuite 411       [place] [address]             [phone]        CARDIOTHORACIC SURGERY PROGRESS NOTE  Subjective: Looks and feels better.  Denies SOB  Objective: Vital signs: BP Readings from Last 1 Encounters:  07/04/16 (!) 126/50   Pulse Readings from Last 1 Encounters:  07/04/16 96   Resp Readings from Last 1 Encounters:  07/04/16 20   Temp Readings from Last 1 Encounters:  07/04/16 97.3 F (36.3 C)    Hemodynamics: CVP:  [4 mmHg-5 mmHg] 4 mmHg  Mixed venous co-ox 82%  Physical Exam:  Rhythm:   Afib  Breath sounds: clear  Heart sounds:  Irregular w/ grade III/VI systolic murmur best LLSB  Incisions:  n/a  Abdomen:  soft  Extremities:  Warm, well perfused   Intake/Output from previous day: 08/20 0701 - 08/21 0700 In: 1629.6 [P.O.:720; I.V.:909.6] Out: 4600 [Urine:4600] Intake/Output this shift: Total I/O In: 511.6 [P.O.:360; I.V.:151.6] Out: -   Lab Results:  CBC: Recent Labs  07/03/16 0446 07/04/16 0534  WBC 6.4 5.5  HGB 12.7* 12.8*  HCT 39.3 39.2  PLT 205 199    BMET:  Recent Labs  07/03/16 0446 07/04/16 0534  NA 135 133*  K 4.6 4.5  CL 95* 96*  CO2 30 28  GLUCOSE 189* 273*  BUN 17 13  CREATININE 1.23 1.11  CALCIUM 9.2 9.2     PT/INR:  No results for input(s): LABPROT, INR in the last 72 hours.  CBG (last 3)   Recent Labs  07/03/16 2118 07/04/16 0820 07/04/16 1112  GLUCAP 169* 167* 215*    ABG    Component Value Date/Time   PHART 7.458 (H) 06/17/2016 1035   PCO2ART 38.7 06/17/2016 1035   PO2ART 80.0 06/17/2016 1035   HCO3 29.2 (H) 06/17/2016 1053   TCO2 31 06/17/2016 1053   ACIDBASEDEF 0.0 07/25/2011 1319   O2SAT 82.0 07/04/2016 0550    CT ANGIOGRAPHY HEAD AND NECK  TECHNIQUE: Multidetector CT imaging of the head and neck was performed using the standard protocol during bolus administration of intravenous contrast. Multiplanar CT image reconstructions and MIPs were obtained to  evaluate the vascular anatomy. Carotid stenosis measurements (when applicable) are obtained utilizing NASCET criteria, using the distal internal carotid diameter as the denominator.  CONTRAST:  50 mL Isovue 370  COMPARISON:  MRI brain 04/03/2012, head CT 10/01/2015.  FINDINGS: CT HEAD  Brain: No mass lesion, intraparenchymal hemorrhage or extra-axial collection. No evidence of acute infarct. No midline shift or hydrocephalus. There is confluent periventricular hypoattenuation compatible with chronic microvascular disease. Left temporal encephalomalacia is unchanged. Old right cerebellar infarct. Ventricles and sulci are normal for patient age.  Calvarium and skull base: The skull is normal. Visualized extracranial soft tissues are normal.  Paranasal sinuses: Normal  Orbits: Normal  CTA HEAD  Intracranial internal carotid arteries: Mild atherosclerotic calcification at the cavernous and clinoid segments.  Anterior cerebral arteries: Normal  Middle cerebral arteries: Normal.  Posterior communicating arteries: Present on the right. Absent on the left.  Posterior cerebral arteries: Normal.  Basilar artery: Normal.  Vertebral arteries: Right-dominant. There is loss of the normal opacification of the left vertebral artery V4 segment distal to the origin of the PICA.  Superior cerebellar arteries: Normal.  Anterior inferior cerebellar arteries: Not clearly visualized.  Posterior inferior cerebellar arteries: Normal.  Venous sinuses: Normal  Anatomic variants: None  Delayed phase: Unremarkable  CTA NECK  Aortic arch: There is mild atherosclerotic calcification within the aortic arch and proximal great vessels. Normal 3 vessel configuration. Moderate narrowing of the proximal left common carotid artery secondary to atherosclerotic plaque. Both subclavian arteries are patent.  Right carotid system: There is atherosclerotic plaque at the  right carotid bifurcation resulting in near complete common greater than 90% stenosis. This stenosis covers only a short segment of the artery in the remainder of the right internal carotid artery is normal.  Left carotid system: As above, moderate atherosclerotic narrowing of the proximal left common carotid artery. There is atherosclerotic plaquing calcification at the left carotid bifurcation resulting in stenosis of approximately 70%. The remainder of the course of the left internal carotid artery is normal.  Vertebral arteries:There is atherosclerotic plaque at the origin of the right vertebral artery, but the origin remains clearly patent. The course and caliber of the right vertebral artery are normal without stenosis. There is atherosclerotic calcification of the origin of the left vertebral artery without significant stenosis. The left vertebral artery is diminutive along its entire course.  Skeleton: Multilevel facet arthrosis and osteophytosis. No lytic or blastic osseous lesions.  Other neck: Numerous scattered subcentimeter mediastinal lymph nodes. Parotid and submandibular glands are normal. The larynx is normal. There is fullness within the left fossa of Rosenmuller. Unremarkable thyroid  Upper chest: There are large bilateral pleural effusions and biapical emphysema. No nodules or masses.  IMPRESSION: 1. Critical stenosis of the proximal right internal carotid artery, secondary to the presence of atherosclerotic plaque. 2. Moderate to severe stenosis of the proximal left internal carotid artery, measuring approximately 70%. 3. **An incidental finding of potential clinical significance has been found. There is fullness within the left fossa of Rosenmuller, which could indicate the presence of a nasopharyngeal mass. Recommend correlation with direct visualization.** 4. Severe narrowing of the distal V4 segment of the left vertebral artery, distal to the  PICA origin. 5. No intracranial occlusion or advanced stenosis. 6. No acute intracranial abnormality. 7. Large bilateral pleural effusions.   Electronically Signed   By: Ulyses Jarred M.D.   On: 07/02/2016 22:59    Assessment/Plan:  [name] [name] looks considerably better than he did last week.   Breathing much better, CVP low and co-ox 82%  I have personally reviewed the patient's CTA head and neck and discussed the results with the patient and 2 family members at the bedside.     Will proceed with cardiac-gated CTA heart and CTA chest/abd/pelvis to complete the imaging work-up to assess the feasibility of TAVR  I spent in excess of 15 minutes during the conduct of this hospital encounter and >50% of this time involved direct face-to-face encounter with the patient for counseling and/or coordination of their care.   Rexene Alberts, MD 07/04/2016 4:29 PM

## 2016-07-04 NOTE — Progress Notes (Signed)
CARDIAC REHAB PHASE I   PRE:  Rate/Rhythm: 90 afib    BP: sitting 129/53    SaO2: 97 RA  MODE:  Ambulation: 200 ft   POST:  Rate/Rhythm: 121 max, mostly 100s    BP: sitting 112/54     SaO2: 97 RA  Pt eager to walk. Used RW, gait belt, assist x2. Unsteady at times, esp when making decisions. He seems cognitively better when he is resting. When walking he doesn't seem to process decisions as well. Denied fatigue however seemed SOB/fatigued to [place]. To recliner, positioned chair to see eclipse out window at his request. Pony, ACSM 07/04/2016 1:25 PM

## 2016-07-04 NOTE — Progress Notes (Signed)
Patient ID: [name] [name], male   DOB: [birth year], 82 y.o.   MRN: [id number]    Advanced Heart Failure Rounding Note   Subjective:    Moved to ICU and given IV lasix and started on milrinone with NYHA IV symptoms.      He remains in atrial fibrillation with HR 80s-100s on amiodarone gtt and heparin gtt.  Remains on milrinone 0.25 mcg. Todays CO-OX is 82%.   Complaining of fatigue. Denies SOB.    TEE (8/17): EF 30-35%, severe AS, moderate to severe MR (suspect functional MR).   Carotid dopplers > 80% RICA stenosis.  CTA head/neck with >90% (critical) RICA stenosis and [id number] LICA.   RHC/LHC 06/17/16 RA 11 PCWP 25  CO/CI 3.25/1.4   Ost Cx to Prox Cx lesion, 50 %stenosed.  Mid Cx to Dist Cx lesion, 80 %stenosed.  Mid RCA lesion, 40 %stenosed.  Mid LAD lesion, 75 %stenosed.  2nd Diag lesion, 75 %stenosed.  Hemodynamic findings consistent with moderate pulmonary hypertension.  Unable to cross the aortic valve.  PA sat 52%. CO 3.2 L/min.   Objective:   Weight Range:  Vital Signs:   Temp:  [97.2 F (36.2 C)-97.4 F (36.3 C)] 97.3 F (36.3 C) (08/21 0400) Pulse Rate:  [36-98] 58 (08/21 0600) Resp:  [14-18] 15 (08/21 0600) BP: (102-141)/(38-105) 122/58 (08/21 0600) SpO2:  [94 %-99 %] 99 % (08/21 0600) Weight:  [207 lb 7.3 oz (94.1 kg)] 207 lb 7.3 oz (94.1 kg) (08/21 0500) Last BM Date: 07/01/16  Weight change: Filed Weights   07/02/16 0600 07/03/16 0500 07/04/16 0500  Weight: 212 lb 6.4 oz (96.3 kg) 211 lb 8 oz (95.9 kg) 207 lb 7.3 oz (94.1 kg)    Intake/Output:   Intake/Output Summary (Last 24 hours) at 07/04/16 0710 Last data filed at 07/04/16 0600  Gross per 24 hour  Intake           1591.7 ml  Output             4600 ml  Net          -3008.3 ml     Physical Exam: CVP 3  General:  NAD. In bed HEENT: normal Neck: supple. JVP flat. Carotids 2+ bilat; no bruits. No thyromegaly or nodule noted. Cor: PMI nondisplaced. Irregular RVR ate & rhythm. No S3.   2/6 systolic crescendo-decrescendo murmur with muffling of S2.   Lungs: Decreased in bases on 4 liters [place] Abdomen: soft, NT, ND, no HSM. No bruits or masses. +BS  Extremities: no cyanosis, clubbing, rash,  R and LLE no edema.  Neuro: alert & orientedx3, cranial nerves grossly intact. moves all 4 extremities w/o difficulty. Affect pleasant GU: Foley   Telemetry: A fib 80-90s  Labs: Basic Metabolic Panel:  Recent Labs Lab 06/29/16 0405 06/30/16 1315 07/01/16 0254 07/02/16 0440 07/03/16 0446 07/04/16 0534  NA 137  --  136 136 135 133*  K 3.1*  --  4.2 4.3 4.6 4.5  CL 97*  --  97* 96* 95* 96*  CO2 29  --  30 28 30 28   GLUCOSE 148*  --  157* 204* 189* 273*  BUN 20  --  18 19 17 13   CREATININE 1.16  --  1.27* 1.08 1.23 1.11  CALCIUM 9.1  --  9.5 9.2 9.2 9.2  MG  --  1.9  --   --   --   --     Liver Function Tests:  Recent Labs Lab 06/28/16 1316  AST 24  ALT 27  ALKPHOS 72  BILITOT 1.7*  PROT 7.1  ALBUMIN 3.9   No results for input(s): LIPASE, AMYLASE in the last 168 hours. No results for input(s): AMMONIA in the last 168 hours.  CBC:  Recent Labs Lab 06/28/16 1316 07/01/16 0254 07/02/16 0440 07/03/16 0446 07/04/16 0534  WBC 6.8 10.1 8.1 6.4 5.5  NEUTROABS 5.1  --   --   --   --   HGB 15.2 14.0 13.6 12.7* 12.8*  HCT 44.6 42.5 41.1 39.3 39.2  MCV 89.6 90.8 90.5 90.3 90.5  PLT 266 272 230 205 199    Cardiac Enzymes: No results for input(s): CKTOTAL, CKMB, CKMBINDEX, TROPONINI in the last 168 hours.  BNP: BNP (last 3 results)  Recent Labs  05/03/16 1305 05/28/16 1617 06/28/16 1316  BNP 232.0* 294.0* 222.0*    ProBNP (last 3 results) No results for input(s): PROBNP in the last 8760 hours.    Other results:  Imaging: Ct Angio Head W Or Wo Contrast  Result Date: 07/02/2016 CLINICAL DATA:  Carotid stenosis EXAM: CT ANGIOGRAPHY HEAD AND NECK TECHNIQUE: Multidetector CT imaging of the head and neck was performed using the standard protocol during  bolus administration of intravenous contrast. Multiplanar CT image reconstructions and MIPs were obtained to evaluate the vascular anatomy. Carotid stenosis measurements (when applicable) are obtained utilizing NASCET criteria, using the distal internal carotid diameter as the denominator. CONTRAST:  50 mL Isovue 370 COMPARISON:  MRI brain 04/03/2012, head CT 10/01/2015. FINDINGS: CT HEAD Brain: No mass lesion, intraparenchymal hemorrhage or extra-axial collection. No evidence of acute infarct. No midline shift or hydrocephalus. There is confluent periventricular hypoattenuation compatible with chronic microvascular disease. Left temporal encephalomalacia is unchanged. Old right cerebellar infarct. Ventricles and sulci are normal for patient age. Calvarium and skull base: The skull is normal. Visualized extracranial soft tissues are normal. Paranasal sinuses: Normal Orbits: Normal CTA HEAD Intracranial internal carotid arteries: Mild atherosclerotic calcification at the cavernous and clinoid segments. Anterior cerebral arteries: Normal Middle cerebral arteries: Normal. Posterior communicating arteries: Present on the right. Absent on the left. Posterior cerebral arteries: Normal. Basilar artery: Normal. Vertebral arteries: Right-dominant. There is loss of the normal opacification of the left vertebral artery V4 segment distal to the origin of the PICA. Superior cerebellar arteries: Normal. Anterior inferior cerebellar arteries: Not clearly visualized. Posterior inferior cerebellar arteries: Normal. Venous sinuses: Normal Anatomic variants: None Delayed phase: Unremarkable CTA NECK Aortic arch: There is mild atherosclerotic calcification within the aortic arch and proximal great vessels. Normal 3 vessel configuration. Moderate narrowing of the proximal left common carotid artery secondary to atherosclerotic plaque. Both subclavian arteries are patent. Right carotid system: There is atherosclerotic plaque at the right  carotid bifurcation resulting in near complete common greater than 90% stenosis. This stenosis covers only a short segment of the artery in the remainder of the right internal carotid artery is normal. Left carotid system: As above, moderate atherosclerotic narrowing of the proximal left common carotid artery. There is atherosclerotic plaquing calcification at the left carotid bifurcation resulting in stenosis of approximately 70%. The remainder of the course of the left internal carotid artery is normal. Vertebral arteries:There is atherosclerotic plaque at the origin of the right vertebral artery, but the origin remains clearly patent. The course and caliber of the right vertebral artery are normal without stenosis. There is atherosclerotic calcification of the origin of the left vertebral artery without significant stenosis. The left vertebral artery is diminutive along its entire course.  Skeleton: Multilevel facet arthrosis and osteophytosis. No lytic or blastic osseous lesions. Other neck: Numerous scattered subcentimeter mediastinal lymph nodes. Parotid and submandibular glands are normal. The larynx is normal. There is fullness within the left fossa of Rosenmuller. Unremarkable thyroid Upper chest: There are large bilateral pleural effusions and biapical emphysema. No nodules or masses. IMPRESSION: 1. Critical stenosis of the proximal right internal carotid artery, secondary to the presence of atherosclerotic plaque. 2. Moderate to severe stenosis of the proximal left internal carotid artery, measuring approximately 70%. 3. **An incidental finding of potential clinical significance has been found. There is fullness within the left fossa of Rosenmuller, which could indicate the presence of a nasopharyngeal mass. Recommend correlation with direct visualization.** 4. Severe narrowing of the distal V4 segment of the left vertebral artery, distal to the PICA origin. 5. No intracranial occlusion or advanced  stenosis. 6. No acute intracranial abnormality. 7. Large bilateral pleural effusions. Electronically Signed   By: Ulyses Jarred M.D.   On: 07/02/2016 22:59   Ct Angio Neck W Or Wo Contrast  Result Date: 07/02/2016 CLINICAL DATA:  Carotid stenosis EXAM: CT ANGIOGRAPHY HEAD AND NECK TECHNIQUE: Multidetector CT imaging of the head and neck was performed using the standard protocol during bolus administration of intravenous contrast. Multiplanar CT image reconstructions and MIPs were obtained to evaluate the vascular anatomy. Carotid stenosis measurements (when applicable) are obtained utilizing NASCET criteria, using the distal internal carotid diameter as the denominator. CONTRAST:  50 mL Isovue 370 COMPARISON:  MRI brain 04/03/2012, head CT 10/01/2015. FINDINGS: CT HEAD Brain: No mass lesion, intraparenchymal hemorrhage or extra-axial collection. No evidence of acute infarct. No midline shift or hydrocephalus. There is confluent periventricular hypoattenuation compatible with chronic microvascular disease. Left temporal encephalomalacia is unchanged. Old right cerebellar infarct. Ventricles and sulci are normal for patient age. Calvarium and skull base: The skull is normal. Visualized extracranial soft tissues are normal. Paranasal sinuses: Normal Orbits: Normal CTA HEAD Intracranial internal carotid arteries: Mild atherosclerotic calcification at the cavernous and clinoid segments. Anterior cerebral arteries: Normal Middle cerebral arteries: Normal. Posterior communicating arteries: Present on the right. Absent on the left. Posterior cerebral arteries: Normal. Basilar artery: Normal. Vertebral arteries: Right-dominant. There is loss of the normal opacification of the left vertebral artery V4 segment distal to the origin of the PICA. Superior cerebellar arteries: Normal. Anterior inferior cerebellar arteries: Not clearly visualized. Posterior inferior cerebellar arteries: Normal. Venous sinuses: Normal Anatomic  variants: None Delayed phase: Unremarkable CTA NECK Aortic arch: There is mild atherosclerotic calcification within the aortic arch and proximal great vessels. Normal 3 vessel configuration. Moderate narrowing of the proximal left common carotid artery secondary to atherosclerotic plaque. Both subclavian arteries are patent. Right carotid system: There is atherosclerotic plaque at the right carotid bifurcation resulting in near complete common greater than 90% stenosis. This stenosis covers only a short segment of the artery in the remainder of the right internal carotid artery is normal. Left carotid system: As above, moderate atherosclerotic narrowing of the proximal left common carotid artery. There is atherosclerotic plaquing calcification at the left carotid bifurcation resulting in stenosis of approximately 70%. The remainder of the course of the left internal carotid artery is normal. Vertebral arteries:There is atherosclerotic plaque at the origin of the right vertebral artery, but the origin remains clearly patent. The course and caliber of the right vertebral artery are normal without stenosis. There is atherosclerotic calcification of the origin of the left vertebral artery without significant stenosis. The left vertebral artery is  diminutive along its entire course. Skeleton: Multilevel facet arthrosis and osteophytosis. No lytic or blastic osseous lesions. Other neck: Numerous scattered subcentimeter mediastinal lymph nodes. Parotid and submandibular glands are normal. The larynx is normal. There is fullness within the left fossa of Rosenmuller. Unremarkable thyroid Upper chest: There are large bilateral pleural effusions and biapical emphysema. No nodules or masses. IMPRESSION: 1. Critical stenosis of the proximal right internal carotid artery, secondary to the presence of atherosclerotic plaque. 2. Moderate to severe stenosis of the proximal left internal carotid artery, measuring approximately 70%.  3. **An incidental finding of potential clinical significance has been found. There is fullness within the left fossa of Rosenmuller, which could indicate the presence of a nasopharyngeal mass. Recommend correlation with direct visualization.** 4. Severe narrowing of the distal V4 segment of the left vertebral artery, distal to the PICA origin. 5. No intracranial occlusion or advanced stenosis. 6. No acute intracranial abnormality. 7. Large bilateral pleural effusions. Electronically Signed   By: Ulyses Jarred M.D.   On: 07/02/2016 22:59     Medications:     Scheduled Medications: . antiseptic oral rinse  7 mL Mouth Rinse BID  . aspirin  81 mg Oral Daily  . digoxin  0.125 mg Oral Daily  . furosemide  40 mg Oral Daily  . insulin aspart  0-9 Units Subcutaneous TID WC  . potassium chloride  40 mEq Oral BID  . rosuvastatin  5 mg Oral q1800  . sodium chloride flush  10-40 mL Intracatheter Q12H  . sodium chloride flush  3 mL Intravenous Q12H  . traZODone  100 mg Oral QHS    Infusions: . amiodarone 30 mg/hr (07/04/16 0600)  . heparin 1,400 Units/hr (07/04/16 0600)  . milrinone 0.25 mcg/kg/min (07/04/16 0600)    PRN Medications: sodium chloride, acetaminophen **OR** acetaminophen, bisacodyl, HYDROcodone-acetaminophen, nortriptyline, ondansetron **OR** ondansetron (ZOFRAN) IV, sodium chloride flush, sodium chloride flush   Assessment:   1. Cardiogenic Shock- TEE with EF 30-35% 2. Acute Respiratory Failure 3. A fib RVR 4. CAD -  2 vessel disease 5. Valvular Heart Disease-  Severe AS/moderate-severe MR (MR may be functional).  6. Caroltid Stenosis- Severe carotid stenosis RICA >80% stenosis by carotid dopplers, CTA head/neck with critical >90% RICA stenosis an [id number] LICA.    Plan/Discussion:    CVP 3, now on po Lasix.  Co-ox 82% now on milrinone 0.25.  Will continue.  He is on digoxin. Renal function stable.   HR 80s-100s on amiodarone gtt for rate control + heparin gtt.    2 vessel  CAD, on ASA 81 and added low dose Crestor (prior myalgias with simvastatin).   No BB with low output. May be able to start low dose ACEI soon if creatinine remains stable.   He is too high risk for conventional surgery (low gradient severe AS and moderate to severe probably functional MR).   He is being evaluated for TAVR.  However, prior to TAVR he would need RICA severe stenosis addressed.  Suspect that this would have to be by carotid stenting. He has been seen by vascular surgery.  CTA head/neck on 8/19 showed critical RICA stenosis.    PT recommending HH.    Length of Stay: [place] NP-C  07/04/2016, 7:10 AM  Patient seen and examined with Darrick Grinder, NP. We discussed all aspects of the encounter. I agree with the assessment and plan as stated above.   CVP and co-ox ox on milrinone. Feels ok. This is probably as  good as we can get him hemodynamically. Will review with TCTS and VVS about next steps for possible TAVR and carotid revsacularization. We can proceed with repeat RHC to obtain complete set of hemodynamics as needed. Continue IV amio and heparin for AF. Can move to SDU.   Vlad Mayberry,MD 8:09 AM

## 2016-07-05 ENCOUNTER — Inpatient Hospital Stay (HOSPITAL_COMMUNITY): Payer: Medicare Other

## 2016-07-05 ENCOUNTER — Encounter (HOSPITAL_COMMUNITY): Payer: Self-pay | Admitting: *Deleted

## 2016-07-05 LAB — CBC
HEMATOCRIT: 41.3 % (ref 39.0–52.0)
HEMOGLOBIN: 13.6 g/dL (ref 13.0–17.0)
MCH: 29.7 pg (ref 26.0–34.0)
MCHC: 32.9 g/dL (ref 30.0–36.0)
MCV: 90.2 fL (ref 78.0–100.0)
Platelets: 220 10*3/uL (ref 150–400)
RBC: 4.58 MIL/uL (ref 4.22–5.81)
RDW: 14.6 % (ref 11.5–15.5)
WBC: 7.2 10*3/uL (ref 4.0–10.5)

## 2016-07-05 LAB — BASIC METABOLIC PANEL
ANION GAP: 7 (ref 5–15)
BUN: 12 mg/dL (ref 6–20)
CHLORIDE: 98 mmol/L — AB (ref 101–111)
CO2: 30 mmol/L (ref 22–32)
CREATININE: 1.21 mg/dL (ref 0.61–1.24)
Calcium: 9.4 mg/dL (ref 8.9–10.3)
GFR calc non Af Amer: 54 mL/min — ABNORMAL LOW (ref >60)
Glucose, Bld: 180 mg/dL — ABNORMAL HIGH (ref 65–99)
Potassium: 4.7 mmol/L (ref 3.5–5.1)
Sodium: 135 mmol/L (ref 135–145)

## 2016-07-05 LAB — HEPARIN LEVEL (UNFRACTIONATED): HEPARIN UNFRACTIONATED: 0.4 [IU]/mL (ref 0.30–0.70)

## 2016-07-05 LAB — GLUCOSE, CAPILLARY
GLUCOSE-CAPILLARY: 191 mg/dL — AB (ref 65–99)
GLUCOSE-CAPILLARY: 201 mg/dL — AB (ref 65–99)
GLUCOSE-CAPILLARY: 187 mg/dL — AB (ref 65–99)
Glucose-Capillary: 219 mg/dL — ABNORMAL HIGH (ref 65–99)

## 2016-07-05 LAB — CARBOXYHEMOGLOBIN
CARBOXYHEMOGLOBIN: 1.1 % (ref 0.5–1.5)
METHEMOGLOBIN: 0.7 % (ref 0.0–1.5)
O2 SAT: 72.3 %
Total hemoglobin: 13.6 g/dL (ref 13.5–18.0)

## 2016-07-05 LAB — DIGOXIN LEVEL: DIGOXIN LVL: 0.3 ng/mL — AB (ref 0.8–2.0)

## 2016-07-05 MED ORDER — FUROSEMIDE 10 MG/ML IJ SOLN
40.0000 mg | Freq: Two times a day (BID) | INTRAMUSCULAR | Status: DC
  Administered 2016-07-05 – 2016-07-06 (×3): 40 mg via INTRAVENOUS
  Filled 2016-07-05 (×3): qty 4

## 2016-07-05 MED ORDER — METOPROLOL TARTRATE 5 MG/5ML IV SOLN
INTRAVENOUS | Status: AC
  Administered 2016-07-05: 5 mg
  Filled 2016-07-05: qty 10

## 2016-07-05 MED ORDER — METOPROLOL TARTRATE 5 MG/5ML IV SOLN: 5.0000 mg | Freq: Once | INTRAVENOUS | Status: AC

## 2016-07-05 MED ORDER — IOPAMIDOL (ISOVUE-370) INJECTION 76%
INTRAVENOUS | Status: AC
  Administered 2016-07-05: 100 mL
  Filled 2016-07-05: qty 100

## 2016-07-05 MED ORDER — CLOPIDOGREL BISULFATE 75 MG PO TABS
75.0000 mg | ORAL_TABLET | Freq: Every day | ORAL | Status: DC
  Administered 2016-07-05 – 2016-07-07 (×3): 75 mg via ORAL
  Filled 2016-07-05 (×3): qty 1

## 2016-07-05 MED ORDER — METOPROLOL TARTRATE 5 MG/5ML IV SOLN
5.0000 mg | Freq: Once | INTRAVENOUS | Status: AC
  Administered 2016-07-05: 5 mg via INTRAVENOUS

## 2016-07-05 MED ORDER — METOPROLOL TARTRATE 25 MG PO TABS
25.0000 mg | ORAL_TABLET | Freq: Once | ORAL | Status: AC
  Administered 2016-07-05: 25 mg via ORAL
  Filled 2016-07-05: qty 1

## 2016-07-05 NOTE — Progress Notes (Signed)
Physical Therapy Treatment Patient Details Name: [name] [name] MRN: [id number] DOB: [birth year] Today's Date: 07/05/2016    History of Present Illness Patient is an 82 yo male admitted 06/28/16 with marked DOE.  Patient with cardiogenic shock, acute respiratory failure, CAD-2 vessel disease, valvular disease - severe AS/severe MR, carotid stenosis - RICA stenosis 80-90, Afib with RVR.      PMH:  CAD, HTN, HLD, DM, systolic CHF w reduced EF 35-40%, carotid stenosis and aortic valve stenosis, TIA/CVA, Afib, CKD, DJD, PVD    PT Comments    Pt admitted with above diagnosis. Pt currently with functional limitations due to balance and endurance deficits. Pt was able to ambulate with RW with need for steadying assist at times.  Slightly impulsive at times.  Pre TAVR assessed as below.  D/C plan will need to be reassessed depending on surgical plan.   Pt may end up needing Rehab prior to surgery if needs to get stronger first.  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  Pt will benefit from skilled PT to increase their independence and safety with mobility to allow discharge to the venue listed below.    Follow Up Recommendations  Home health PT;Supervision for mobility/OOB (Will continue to assess based on medical/surgical plan)     Equipment Recommendations  None recommended by PT    Recommendations for Other Services       Precautions / Restrictions Precautions Precautions: Fall Restrictions Weight Bearing Restrictions: No    Mobility  Bed Mobility Overal bed mobility: Needs Assistance Bed Mobility: Supine to Sit     Supine to sit: Mod assist     General bed mobility comments: Verbal cues for technique.  Assist to bring LE's off bed and for elevation of trunk.  Transfers Overall transfer level: Needs assistance Equipment used: Rolling walker (2 wheeled) Transfers: Sit to/from Stand Sit to Stand: Min assist;+2 physical assistance         General transfer  comment: Assist to steady and for lines.  Good balance in static stance.  Ambulation/Gait Ambulation/Gait assistance: Min assist;Mod assist;+2 safety/equipment Ambulation Distance (Feet): 376 Feet Assistive device: Rolling walker (2 wheeled) Gait Pattern/deviations: Step-through pattern;Decreased stride length;Ataxic;Staggering left;Drifts right/left;Staggering right;Trunk flexed Gait velocity: decreased Gait velocity interpretation: Below normal speed for age/gender General Gait Details: Patient with slightly unsteady gait, taking short shuffling steps.  Assist for balance.   Cues to stay close to RW as pt pushing RW too far in front.    Stairs            Wheelchair Mobility    Modified Rankin (Stroke Patients Only)       Balance Overall balance assessment: Needs assistance Sitting-balance support: No upper extremity supported;Feet supported Sitting balance-Leahy Scale: Fair     Standing balance support: Bilateral upper extremity supported;During functional activity Standing balance-Leahy Scale: Poor Standing balance comment: relieson RW for balance with dynamic activities.             High level balance activites: Direction changes;Turns;Sudden stops High Level Balance Comments: min to mod assist with LOB with RW.     Cognition Arousal/Alertness: Awake/alert Behavior During Therapy: WFL for tasks assessed/performed Overall Cognitive Status: Within Functional Limits for tasks assessed                      Exercises      General Comments  Frailty score - 5 -mildly frail  6 minute walk test -  376 feet in 6 minutes   Pre BP 116/64, HR 95-98 bpm, O2 88-92% on RA therefore placed on 2LO2, Dyspnea 1, RPE 7  Post BP 132/76, HR 128-135 bpm, O2 94% on 2LO2, Dyspnea 3, RPE 9  Gait speed = 14.5 sec, 12.5 sec, 12.5 sec with average of 13.16 sec with gait speed average =1.25 ft/sec   Pertinent Vitals/Pain Pain Assessment: No/denies pain    Home Living                       Prior Function            PT Goals (current goals can now be found in the care plan section) Progress towards PT goals: Progressing toward goals    Frequency  Min 3X/week    PT Plan Current plan remains appropriate    Co-evaluation             End of Session Equipment Utilized During Treatment: Gait belt;Oxygen Activity Tolerance: Patient limited by fatigue Patient left: in chair;with call bell/phone within reach;with family/visitor present (nephew present)     Time: [id number] PT Time Calculation (min) (ACUTE ONLY): 38 min  Charges:  $Gait Training: 23-37 mins $Therapeutic Activity: 8-22 mins                    G CodesIrwin Brakeman F 07/17/16, 11:42 AM [facility] [phone] (pager)

## 2016-07-05 NOTE — Progress Notes (Signed)
CARDIAC REHAB PHASE I   PRE:  Rate/Rhythm: 96 a fib  BP:  Sitting: 129/53        SaO2: 96 2L  MODE:  Ambulation: 390 ft   POST:  Rate/Rhythm: 141 a fib  BP:  Sitting: 126/84         SaO2: 98 2L  Pt in bed, did require some assistance out of bed, stood with minimal assistance. Pt ambulated 390 ft on 2L O2, rolling walker, IV, foley catheter, assist x2 for safety/equipment, fairly steady gait, tolerated fairly well. Pt did have some mild-moderate DOE, brief standing rest, able to increase distance today. HR elevated during walk, improved with rest. Pt to recliner after walk, feet elevated, call bell within reach. Will continue to follow.   [place], RN, BSN 07/05/2016 2:12 PM

## 2016-07-05 NOTE — Consult Note (Signed)
Reason for Consult: Nasopharyngeal mass Referring Physician: [name], MD  HPI:  [name] [name] is an 82 y.o. male who was admitted on 06/28/16 for increased dyspnea. He was noted to have multiple cardiovascular disease, including cardiogenic shock, acute respiratory failure, CAD-2 vessel disease, valvular disease - severe AS/severe MR, carotid stenosis - RICA stenosis 80-90, Afib with RVR. On his neck CT scan, a possible nasopharyngeal mass was noted. ENT consulted for further evaluation.  Past Medical History:  Diagnosis Date  . Aortic stenosis    a. mod-sev by echo 05/2016.  . Asthma   . Cerebrovascular disease 07/2010   TIA; carotid ultrasound in 07/2010-significant bilateral plaque without focal internal carotid artery stenosis; MRI -encephalomalacia left temporal and right temporal lobes; small inferior right cerebellar infarct; small vessel disease  . Chronic atrial fibrillation (HCC)    Paroxysmal; Echocardiogram in 2007-normal EF; mild LVH; left atrial enlargement; mild stenosis and minimal AI; negative stress nuclear study in 2008  . Chronic systolic CHF (congestive heart failure) ([place])    a. dx 05/2016 - EF 35-40%, diffuse HK, mod-severe AS, mod gradient, severe AVA VTI likely due to decreased cardiac output in setting of systolic dysfsunction and significant mitral regurgitation, mild MR, mod-severe MR, severe LAE, mild-mod RV dilation, mild RAE, mild-mod TR, mod PASP 91mHg.  . CKD (chronic kidney disease), stage II   . Degenerative joint disease    feet and legs  . Diabetes mellitus    no insulin; A1c of 6.6 in 2005  . Dizziness    occurs daily,especially in am  . Exertional dyspnea   . Gastroesophageal reflux disease   . Hepatic steatosis   . History of noncompliance with medical treatment   . Hyperlipidemia    adverse reactions to statins and niacin  . Hypertension    Borderline  . Irregular heartbeat   . Mitral regurgitation    a. mod-sev by echo 05/2016  .  Peripheral vascular disease ([place]   . Renal insufficiency   . [place] arteritis [place]   . Tricuspid regurgitation    a. mild-mod TR by echo 05/2016    Past Surgical History:  Procedure Laterality Date  . COLONOSCOPY  2002  . COLONOSCOPY  01/19/2012   Procedure: COLONOSCOPY;  Surgeon: NRogene Houston MD;  Location: [facility];  Service: Endoscopy;  Laterality: N/A;  1030  . LIPOMA EXCISION  1980  . ORIF ANKLE FRACTURE  2000   Right  . PROSTATE SURGERY  12/2011  . ROTATOR CUFF REPAIR     Right  . TEE WITHOUT CARDIOVERSION N/A 06/17/2016   Procedure: TRANSESOPHAGEAL ECHOCARDIOGRAM (TEE);  Surgeon: MJerline Pain MD;  Location: [facility]  Service: Cardiovascular;  Laterality: N/A;  . TRANSURETHRAL RESECTION OF PROSTATE  09/2011  . URETHRAL STRICTURE DILATATION  1980s    Family History  Problem Relation Age of Onset  . Stroke Mother   . Diabetes Father   . Colon cancer Neg Hx     Social History:  reports that he quit smoking about 24 years ago. His smoking use included Cigarettes. He started smoking about 66 years ago. He has a 20.00 pack-year smoking history. His smokeless tobacco use includes Chew. He reports that he does not drink alcohol or use drugs.  Allergies:  Allergies  Allergen Reactions  . Ambien [Zolpidem Tartrate] Other (See Comments)    Sleep walks  . Cholestatin   . Lipitor [Atorvastatin Calcium] Other (See Comments)    myalgias  . Ranitidine Other (See  Comments)    Chest discomfort  . Simvastatin Other (See Comments)    Myalgias  . Xanax Xr [Alprazolam Er]     Tightness in chest    Prior to Admission medications   Medication Sig Start Date End Date Taking? Authorizing Provider  albuterol (PROVENTIL HFA;VENTOLIN HFA) 108 (90 Base) MCG/ACT inhaler Inhale 1-2 puffs into the lungs 5 (five) times daily as needed for wheezing or shortness of breath. 05/28/16  Yes Nat Christen, MD  furosemide (LASIX) 20 MG tablet Take 2 tablets (40 mg total) by mouth daily.  06/22/16 09/20/16 Yes Arnoldo Lenis, MD  HYDROcodone-acetaminophen (NORCO/VICODIN) 5-325 MG tablet Take 1 tablet by mouth 3 (three) times daily as needed for moderate pain. 05/05/16  Yes Kathyrn Drown, MD  metFORMIN (GLUCOPHAGE) 500 MG tablet 1/2 tablet twice a day 06/19/16  Yes Jettie Booze, MD  metoprolol succinate (TOPROL-XL) 25 MG 24 hr tablet Take 0.5 tablets (12.5 mg total) by mouth daily. 06/22/16  Yes Arnoldo Lenis, MD  nortriptyline (PAMELOR) 10 MG capsule TAKE 1 TO 2 CAPSULES AT BEDTIME FOR BURNING IN FEET. 03/29/16  Yes Kathyrn Drown, MD  traZODone (DESYREL) 100 MG tablet Take 1 tablet (100 mg total) by mouth at bedtime. 02/05/16  Yes Nilda Simmer, NP  warfarin (COUMADIN) 5 MG tablet Take 1 tablet (5 mg total) by mouth daily. TAKE AS DIRECTED BY PHYSICIAN Patient taking differently: Take 2.5-5 mg by mouth See admin instructions. TAKE 87m daily except take 2.518m(1/2 tab) on Tuesday, Thursday and Saturday. 02/05/16  Yes CaNilda SimmerNP    Medications:  I have reviewed the patient's current medications. Scheduled: . antiseptic oral rinse  7 mL Mouth Rinse BID  . aspirin  81 mg Oral Daily  . clopidogrel  75 mg Oral Daily  . digoxin  0.125 mg Oral Daily  . furosemide  40 mg Intravenous BID  . insulin aspart  0-9 Units Subcutaneous TID WC  . iopamidol      . potassium chloride  40 mEq Oral BID  . rosuvastatin  5 mg Oral q1800  . sodium chloride  500 mL Intravenous Once  . sodium chloride flush  10-40 mL Intracatheter Q12H  . sodium chloride flush  3 mL Intravenous Q12H  . traZODone  100 mg Oral QHS   PRGGY:IRSWNIhloride, acetaminophen **OR** acetaminophen, bisacodyl, HYDROcodone-acetaminophen, nortriptyline, ondansetron **OR** ondansetron (ZOFRAN) IV, sodium chloride flush, sodium chloride flush  Results for orders placed or performed during the hospital encounter of 06/28/16 (from the past 48 hour(s))  Glucose, capillary     Status: Abnormal   Collection Time:  07/03/16  4:48 PM  Result Value Ref Range   Glucose-Capillary 140 (H) 65 - 99 mg/dL  Glucose, capillary     Status: Abnormal   Collection Time: 07/03/16  9:18 PM  Result Value Ref Range   Glucose-Capillary 169 (H) 65 - 99 mg/dL  Heparin level (unfractionated)     Status: None   Collection Time: 07/04/16  5:33 AM  Result Value Ref Range   Heparin Unfractionated 0.53 0.30 - 0.70 IU/mL    Comment:        IF HEPARIN RESULTS ARE BELOW EXPECTED VALUES, AND PATIENT DOSAGE HAS BEEN CONFIRMED, SUGGEST FOLLOW UP TESTING OF ANTITHROMBIN III LEVELS.   CBC     Status: Abnormal   Collection Time: 07/04/16  5:34 AM  Result Value Ref Range   WBC 5.5 4.0 - 10.5 K/uL   RBC 4.33 4.22 -  5.81 MIL/uL   Hemoglobin 12.8 (L) 13.0 - 17.0 g/dL   HCT 39.2 39.0 - 52.0 %   MCV 90.5 78.0 - 100.0 fL   MCH 29.6 26.0 - 34.0 pg   MCHC 32.7 30.0 - 36.0 g/dL   RDW 14.8 11.5 - 15.5 %   Platelets 199 150 - 400 K/uL  Basic metabolic panel     Status: Abnormal   Collection Time: 07/04/16  5:34 AM  Result Value Ref Range   Sodium 133 (L) 135 - 145 mmol/L   Potassium 4.5 3.5 - 5.1 mmol/L   Chloride 96 (L) 101 - 111 mmol/L   CO2 28 22 - 32 mmol/L   Glucose, Bld 273 (H) 65 - 99 mg/dL   BUN 13 6 - 20 mg/dL   Creatinine, Ser 1.11 0.61 - 1.24 mg/dL   Calcium 9.2 8.9 - 10.3 mg/dL   GFR calc non Af Amer 60 (L) >60 mL/min   GFR calc Af Amer >60 >60 mL/min    Comment: (NOTE) The eGFR has been calculated using the CKD EPI equation. This calculation has not been validated in all clinical situations. eGFR's persistently <60 mL/min signify possible Chronic Kidney Disease.    Anion gap 9 5 - 15  Carboxyhemoglobin     Status: Abnormal   Collection Time: 07/04/16  5:50 AM  Result Value Ref Range   Total hemoglobin 12.9 (L) 13.5 - 18.0 g/dL   O2 Saturation 82.0 %   Carboxyhemoglobin 1.7 (H) 0.5 - 1.5 %   Methemoglobin 0.7 0.0 - 1.5 %  Glucose, capillary     Status: Abnormal   Collection Time: 07/04/16  8:20 AM   Result Value Ref Range   Glucose-Capillary 167 (H) 65 - 99 mg/dL   Comment 1 Notify RN   Glucose, capillary     Status: Abnormal   Collection Time: 07/04/16 11:12 AM  Result Value Ref Range   Glucose-Capillary 215 (H) 65 - 99 mg/dL  Glucose, capillary     Status: Abnormal   Collection Time: 07/04/16  4:38 PM  Result Value Ref Range   Glucose-Capillary 168 (H) 65 - 99 mg/dL   Comment 1 Notify RN   Glucose, capillary     Status: Abnormal   Collection Time: 07/04/16 10:41 PM  Result Value Ref Range   Glucose-Capillary 181 (H) 65 - 99 mg/dL   Comment 1 Capillary Specimen   CBC     Status: None   Collection Time: 07/05/16  3:45 AM  Result Value Ref Range   WBC 7.2 4.0 - 10.5 K/uL   RBC 4.58 4.22 - 5.81 MIL/uL   Hemoglobin 13.6 13.0 - 17.0 g/dL   HCT 41.3 39.0 - 52.0 %   MCV 90.2 78.0 - 100.0 fL   MCH 29.7 26.0 - 34.0 pg   MCHC 32.9 30.0 - 36.0 g/dL   RDW 14.6 11.5 - 15.5 %   Platelets 220 150 - 400 K/uL  Basic metabolic panel     Status: Abnormal   Collection Time: 07/05/16  3:45 AM  Result Value Ref Range   Sodium 135 135 - 145 mmol/L   Potassium 4.7 3.5 - 5.1 mmol/L   Chloride 98 (L) 101 - 111 mmol/L   CO2 30 22 - 32 mmol/L   Glucose, Bld 180 (H) 65 - 99 mg/dL   BUN 12 6 - 20 mg/dL   Creatinine, Ser 1.21 0.61 - 1.24 mg/dL   Calcium 9.4 8.9 - 10.3 mg/dL   GFR calc  non Af Amer 54 (L) >60 mL/min   GFR calc Af Amer >60 >60 mL/min    Comment: (NOTE) The eGFR has been calculated using the CKD EPI equation. This calculation has not been validated in all clinical situations. eGFR's persistently <60 mL/min signify possible Chronic Kidney Disease.    Anion gap 7 5 - 15  Heparin level (unfractionated)     Status: None   Collection Time: 07/05/16  3:45 AM  Result Value Ref Range   Heparin Unfractionated 0.40 0.30 - 0.70 IU/mL    Comment:        IF HEPARIN RESULTS ARE BELOW EXPECTED VALUES, AND PATIENT DOSAGE HAS BEEN CONFIRMED, SUGGEST FOLLOW UP TESTING OF ANTITHROMBIN  III LEVELS.   Digoxin level     Status: Abnormal   Collection Time: 07/05/16  3:45 AM  Result Value Ref Range   Digoxin Level 0.3 (L) 0.8 - 2.0 ng/mL  Carboxyhemoglobin     Status: None   Collection Time: 07/05/16  4:10 AM  Result Value Ref Range   Total hemoglobin 13.6 13.5 - 18.0 g/dL   O2 Saturation 72.3 %   Carboxyhemoglobin 1.1 0.5 - 1.5 %   Methemoglobin 0.7 0.0 - 1.5 %  Glucose, capillary     Status: Abnormal   Collection Time: 07/05/16  7:47 AM  Result Value Ref Range   Glucose-Capillary 191 (H) 65 - 99 mg/dL   Comment 1 Notify RN     No results found.  ROS: As per HPI and PMH. Other systems are reviewed and otherwise negative.   Blood pressure 116/64, pulse (!) 48, temperature 97.5 F (36.4 C), temperature source Oral, resp. rate 19, height 6' 1"  (1.854 m), weight 207 lb 10.8 oz (94.2 kg), SpO2 94 %. GEN: Well nourished, well developed WM, in no acute distress  HEENT: normocephalic, atraumatic. Ears: Normal auricles and EACs. Nose: Dry blood clots in both [place]. Mouth: Normal mucosa. No lesion. Neck: Trachea midline. No mass or lesion. Cardiac: irregularly irregular Respiratory:  clear to auscultation bilaterally, normal work of breathing Skin: warm and dry, no rash Neuro:  Alert and Oriented x 3, Strength and sensation are intact, follows commands, hard of hearing Psych: euthymic mood, full affect  Procedure:  Flexible Fiberoptic Nasal Endoscopy Anesthesia: Topical oxymetazoline and lidocaine Indication: Examination for possible nasopharyngeal mass. Description: Risks, benefits, and alternatives of flexible endoscopy were explained to the patient.  Specific mention was made of the risk of throat numbness with difficulty swallowing, possible bleeding from the nose and mouth, and pain from the procedure.  The patient gave oral consent to proceed.  The nasal cavities were decongested and anesthetised with a combination of oxymetazoline and 4% lidocaine solution.  The  flexible scope was inserted into the right nasal cavity. Dry blood clots were noted. No polyp, mass, or lesion was appreciated.  Olfactory cleft was clear.  Nasopharynx has a significant amount of blood clots. After removal, no NP mass or lesion was noted.  Turbinates were without mass.  The procedure was repeated on the contralateral side with similar findings.  The patient tolerated the procedure well.    Assessment/Plan: Blood clots are noted within the nasopharynx, likely secondary to intermittent epistaxis/ drying effect of [place] oxygen. No suspicious mass or lesion is noted. No intervention is needed at this time. Consider switching the patient's oxygen from [place] to facemask.  Xayla Puzio,SUI W 07/05/2016, 11:25 AM

## 2016-07-05 NOTE — Progress Notes (Signed)
Patient ID: [name] [name], male   DOB: 1932-03-07, 82 y.o.   MRN: [id number]    Advanced Heart Failure Rounding Note   Subjective:    Moved to ICU and given IV lasix and started on milrinone with NYHA IV symptoms.    He remains in atrial fibrillation with HR 80s-100s on amiodarone gtt and heparin gtt.  Remains on milrinone 0.25 mcg. Todays CO-OX is  72%. CVP up to 11. Weight unchanged.   Denies SOB/CP.    TEE (8/17): EF 30-35%, severe AS, moderate to severe MR (suspect functional MR).   Carotid dopplers > 80% RICA stenosis.  CTA head/neck with >90% (critical) RICA stenosis and [id number] LICA.   RHC/LHC 06/17/16 RA 11 PCWP 25  CO/CI 3.25/1.4   Ost Cx to Prox Cx lesion, 50 %stenosed.  Mid Cx to Dist Cx lesion, 80 %stenosed.  Mid RCA lesion, 40 %stenosed.  Mid LAD lesion, 75 %stenosed.  2nd Diag lesion, 75 %stenosed.  Hemodynamic findings consistent with moderate pulmonary hypertension.  Unable to cross the aortic valve.  PA sat 52%. CO 3.2 L/min.   Objective:   Weight Range:  Vital Signs:   Temp:  [97.3 F (36.3 C)-97.7 F (36.5 C)] 97.7 F (36.5 C) (08/22 0400) Pulse Rate:  [48-149] 65 (08/22 0600) Resp:  [17-33] 18 (08/22 0600) BP: (95-132)/(46-68) 119/50 (08/22 0600) SpO2:  [92 %-100 %] 94 % (08/22 0600) Weight:  [94.2 kg (207 lb 10.8 oz)] 94.2 kg (207 lb 10.8 oz) (08/22 0500) Last BM Date: 07/02/16  Weight change: Filed Weights   07/03/16 0500 07/04/16 0500 07/05/16 0500  Weight: 95.9 kg (211 lb 8 oz) 94.1 kg (207 lb 7.3 oz) 94.2 kg (207 lb 10.8 oz)    Intake/Output:   Intake/Output Summary (Last 24 hours) at 07/05/16 0734 Last data filed at 07/05/16 0600  Gross per 24 hour  Intake           1677.7 ml  Output             3650 ml  Net          -1972.3 ml     Physical Exam: CVP 11 General:  NAD. In bed HEENT: normal Neck: supple. JVP ~10. Carotids 2+ bilat; no bruits. No thyromegaly or nodule noted. Cor: PMI nondisplaced. Irregular ate & rhythm.  No S3.  2/6 systolic crescendo-decrescendo murmur with muffling of S2.   Lungs: Decreased in bases on 4 liters [place] Abdomen: soft, NT, ND, no HSM. No bruits or masses. +BS  Extremities: no cyanosis, clubbing, rash,  R and LLE no edema.  Neuro: alert & orientedx3, cranial nerves grossly intact. moves all 4 extremities w/o difficulty. Affect pleasant GU: Foley   Telemetry: A fib 80-90s  Labs: Basic Metabolic Panel:  Recent Labs Lab 06/30/16 1315 07/01/16 0254 07/02/16 0440 07/03/16 0446 07/04/16 0534 07/05/16 0345  NA  --  136 136 135 133* 135  K  --  4.2 4.3 4.6 4.5 4.7  CL  --  97* 96* 95* 96* 98*  CO2  --  30 28 30 28 30   GLUCOSE  --  157* 204* 189* 273* 180*  BUN  --  18 19 17 13 12   CREATININE  --  1.27* 1.08 1.23 1.11 1.21  CALCIUM  --  9.5 9.2 9.2 9.2 9.4  MG 1.9  --   --   --   --   --     Liver Function Tests:  Recent Labs Lab 06/28/16 1316  AST 24  ALT 27  ALKPHOS 72  BILITOT 1.7*  PROT 7.1  ALBUMIN 3.9   No results for input(s): LIPASE, AMYLASE in the last 168 hours. No results for input(s): AMMONIA in the last 168 hours.  CBC:  Recent Labs Lab 06/28/16 1316 07/01/16 0254 07/02/16 0440 07/03/16 0446 07/04/16 0534 07/05/16 0345  WBC 6.8 10.1 8.1 6.4 5.5 7.2  NEUTROABS 5.1  --   --   --   --   --   HGB 15.2 14.0 13.6 12.7* 12.8* 13.6  HCT 44.6 42.5 41.1 39.3 39.2 41.3  MCV 89.6 90.8 90.5 90.3 90.5 90.2  PLT 266 272 230 205 199 220    Cardiac Enzymes: No results for input(s): CKTOTAL, CKMB, CKMBINDEX, TROPONINI in the last 168 hours.  BNP: BNP (last 3 results)  Recent Labs  05/03/16 1305 05/28/16 1617 06/28/16 1316  BNP 232.0* 294.0* 222.0*    ProBNP (last 3 results) No results for input(s): PROBNP in the last 8760 hours.    Other results:  Imaging: No results found.   Medications:     Scheduled Medications: . antiseptic oral rinse  7 mL Mouth Rinse BID  . aspirin  81 mg Oral Daily  . digoxin  0.125 mg Oral Daily  .  furosemide  40 mg Oral Daily  . insulin aspart  0-9 Units Subcutaneous TID WC  . potassium chloride  40 mEq Oral BID  . rosuvastatin  5 mg Oral q1800  . sodium chloride  500 mL Intravenous Once  . sodium chloride flush  10-40 mL Intracatheter Q12H  . sodium chloride flush  3 mL Intravenous Q12H  . traZODone  100 mg Oral QHS    Infusions: . amiodarone 30 mg/hr (07/05/16 0600)  . heparin 1,400 Units/hr (07/05/16 0600)  . milrinone 0.25 mcg/kg/min (07/05/16 0600)    PRN Medications: sodium chloride, acetaminophen **OR** acetaminophen, bisacodyl, HYDROcodone-acetaminophen, nortriptyline, ondansetron **OR** ondansetron (ZOFRAN) IV, sodium chloride flush, sodium chloride flush   Assessment:   1. Cardiogenic Shock- TEE with EF 30-35% 2. Acute Respiratory Failure 3. A fib RVR 4. CAD -  2 vessel disease 5. Valvular Heart Disease-  Severe AS/moderate-severe MR (MR may be functional).  6. Caroltid Stenosis- Severe carotid stenosis RICA >80% stenosis by carotid dopplers, CTA head/neck with critical >90% RICA stenosis an [id number] LICA.  7. Bilateral Effusions  8. Nasopharyngeal mass   Plan/Discussion:    CVP up to 11. Stop po lasix and start 40 mg IV lasix twice a daily.Co-ox 72% now on milrinone 0.25.  Will continue.  He is on digoxin. Renal function stable.   HR 80s-90s on amiodarone gtt for rate control + heparin gtt.    2 vessel CAD, on ASA 81 and added low dose Crestor (prior myalgias with simvastatin).   No BB with low output. May be able to start low dose ACEI soon if creatinine remains stable.   He is too high risk for conventional surgery (low gradient severe AS and moderate to severe probably functional MR).   He is being evaluated for TAVR.  However, prior to TAVR he would need RICA severe stenosis addressed.  Suspect that this would have to be by carotid stenting.   Vascular following with final recommendations pending. CTA head/neck on 8/19 showed critical RICA stenosis.   PT  recommending HH.    Transfer to stepdown.   Length of Stay: [place] NP-C  07/05/2016, 7:34 AM  Patient seen and examined with Darrick Grinder, NP.  We discussed all aspects of the encounter. I agree with the assessment and plan as stated above.   Remains on milrinone. CVP back up today. Co-ox ok. Will restart IV lasix. AF rate controlled on IV amio. He has been seen by TCTS and VVS. Undergoing scans now for possible TAVR. VVS discussing whether or not carotid stenting is possible. He looks weak but was able to ambulate 200 feet with CR yesterday and he feels like he is getting stronger. Also, has incidental finding of naspoharyngeal mass.   Overall, I think he remains high-risk for any major procedure but he is improving. Will have ENT consult today to assess nasopharyngeal mass and await results of pre-TAVR scans to decide on next steps. Continue milrinone for now. Continue to ambulate.   Jaivon Vanbeek,MD 8:52 AM

## 2016-07-05 NOTE — Progress Notes (Signed)
ANTICOAGULATION CONSULT NOTE - Follow Up Consult  Pharmacy Consult for Heparin Indication: atrial fibrillation  Allergies  Allergen Reactions  . Ambien [Zolpidem Tartrate] Other (See Comments)    Sleep walks  . Cholestatin   . Lipitor [Atorvastatin Calcium] Other (See Comments)    myalgias  . Ranitidine Other (See Comments)    Chest discomfort  . Simvastatin Other (See Comments)    Myalgias  . Xanax Xr [Alprazolam Er]     Tightness in chest    Patient Measurements: Height: 6\' 1"  (185.4 cm) Weight: 207 lb 10.8 oz (94.2 kg) IBW/kg (Calculated) : 79.9  Vital Signs: Temp: 97.5 F (36.4 C) (08/22 0746) Temp Source: Oral (08/22 0746) BP: 116/64 (08/22 0800) Pulse Rate: 48 (08/22 0800)  Labs:  Recent Labs  07/03/16 0446 07/03/16 0447 07/04/16 0533 07/04/16 0534 07/05/16 0345  HGB 12.7*  --   --  12.8* 13.6  HCT 39.3  --   --  39.2 41.3  PLT 205  --   --  199 220  HEPARINUNFRC  --  0.40 0.53  --  0.40  CREATININE 1.23  --   --  1.11 1.21    Estimated Creatinine Clearance: 53.2 mL/min (by C-G formula based on SCr of 1.21 mg/dL).  Medications: Heparin @ 1400 units/hr  Assessment: [name] [name] continues on heparin for afib (while coumadin on hold) in the setting of cardiogenic shock. Heparin level remains therapeutic at 0.40. CBC stable. No bleeding reported. Anticipating possible TAVR.  Goal of Therapy:  Heparin level 0.3-0.7 units/ml Monitor platelets by anticoagulation protocol: Yes   Plan:  1) Continue heparin at 1400 units/hr 2) Daily heparin level, CBC   Nena Jordan, PharmD, BCPS 07/05/2016 9:27 AM

## 2016-07-05 NOTE — Progress Notes (Signed)
   VASCULAR SURGERY ASSESSMENT & PLAN:  I have reviewed the CT of the neck with Dr. Oneida Alar. He appears to be a candidate for a right Carotid stent. I will start Plavix. Discussed with Dr. Roxy Manns. Will try to schedule for early next week.   SUBJECTIVE: No complaints.   PHYSICAL EXAM: Vitals:   07/05/16 0600 07/05/16 0746 07/05/16 0800 07/05/16 1200  BP: (!) 119/50 (!) 101/42 116/64 135/72  Pulse: 65 100 (!) 48 74  Resp: 18 20 19  (!) 28  Temp:  97.5 F (36.4 C)  97.4 F (36.3 C)  TempSrc:  Oral  Oral  SpO2: 94% 93% 94% 94%  Weight:      Height:       Neuro: intact.   LABS: Lab Results  Component Value Date   WBC 7.2 07/05/2016   HGB 13.6 07/05/2016   HCT 41.3 07/05/2016   MCV 90.2 07/05/2016   PLT 220 07/05/2016   Lab Results  Component Value Date   CREATININE 1.21 07/05/2016   Lab Results  Component Value Date   INR 1.53 06/30/2016   CBG (last 3)   Recent Labs  07/04/16 2241 07/05/16 0747 07/05/16 1229  GLUCAP 181* 191* 201*    Principal Problem:   Cardiogenic shock (HCC) Active Problems:   Hypertension   Cerebrovascular disease   Atrial fibrillation (HCC)   Hyperlipidemia   Aortic stenosis   Diabetes mellitus (HCC)   Carotid stenosis   Anemia of chronic disease   Systolic congestive heart failure (HCC)   Dyspnea   CAD (coronary artery disease), native coronary artery   Acute on chronic combined systolic and diastolic CHF, NYHA class 4 ([place])   Acute respiratory failure with hypoxia ([place])  Gae Gallop [id number] 07/05/2016

## 2016-07-06 ENCOUNTER — Telehealth: Payer: Self-pay | Admitting: Cardiovascular Disease

## 2016-07-06 LAB — BASIC METABOLIC PANEL
Anion gap: 9 (ref 5–15)
BUN: 16 mg/dL (ref 6–20)
CHLORIDE: 93 mmol/L — AB (ref 101–111)
CO2: 30 mmol/L (ref 22–32)
CREATININE: 1.22 mg/dL (ref 0.61–1.24)
Calcium: 9.6 mg/dL (ref 8.9–10.3)
GFR calc Af Amer: >60 mL/min (ref >60)
GFR calc non Af Amer: 53 mL/min — ABNORMAL LOW (ref >60)
GLUCOSE: 190 mg/dL — AB (ref 65–99)
Potassium: 4.9 mmol/L (ref 3.5–5.1)
SODIUM: 132 mmol/L — AB (ref 135–145)

## 2016-07-06 LAB — CBC
HEMATOCRIT: 41.8 % (ref 39.0–52.0)
HEMOGLOBIN: 14.2 g/dL (ref 13.0–17.0)
MCH: 30.2 pg (ref 26.0–34.0)
MCHC: 34 g/dL (ref 30.0–36.0)
MCV: 88.9 fL (ref 78.0–100.0)
Platelets: 236 10*3/uL (ref 150–400)
RBC: 4.7 MIL/uL (ref 4.22–5.81)
RDW: 14.5 % (ref 11.5–15.5)
WBC: 8.4 10*3/uL (ref 4.0–10.5)

## 2016-07-06 LAB — CARBOXYHEMOGLOBIN
CARBOXYHEMOGLOBIN: 1.3 % (ref 0.5–1.5)
Methemoglobin: 0.8 % (ref 0.0–1.5)
O2 SAT: 60.4 %
TOTAL HEMOGLOBIN: 14.4 g/dL (ref 13.5–18.0)

## 2016-07-06 LAB — GLUCOSE, CAPILLARY
GLUCOSE-CAPILLARY: 213 mg/dL — AB (ref 65–99)
Glucose-Capillary: 214 mg/dL — ABNORMAL HIGH (ref 65–99)
Glucose-Capillary: 201 mg/dL — ABNORMAL HIGH (ref 65–99)
Glucose-Capillary: 169 mg/dL — ABNORMAL HIGH (ref 65–99)

## 2016-07-06 LAB — HEPARIN LEVEL (UNFRACTIONATED): HEPARIN UNFRACTIONATED: 0.52 [IU]/mL (ref 0.30–0.70)

## 2016-07-06 MED ORDER — FUROSEMIDE 40 MG PO TABS
40.0000 mg | ORAL_TABLET | Freq: Two times a day (BID) | ORAL | Status: DC
  Administered 2016-07-06 – 2016-07-09 (×6): 40 mg via ORAL
  Filled 2016-07-06 (×6): qty 1

## 2016-07-06 NOTE — Progress Notes (Signed)
Nutrition Follow-up  DOCUMENTATION CODES:   Not applicable  INTERVENTION:   -Continue with Heart Healthy diet -Continue to encourage adequate PO intake  NUTRITION DIAGNOSIS:   Inadequate oral intake related to poor appetite, chronic illness as evidenced by per patient/family report.  Progressing  GOAL:   Patient will meet greater than or equal to 90% of their needs  Progressing  MONITOR:   PO intake, I & O's, Labs, Weight trends, Supplement acceptance  REASON FOR ASSESSMENT:   Malnutrition Screening Tool    ASSESSMENT:    [name] [name] is a 82 y.o. male with history of CAF, HTN, HL, DM2, systolic CHF w reduced EF 35-40%, carotid stenosis and aortic valve stenosis.  Presenting with marked DOE , can't walk 10-15 feet and unable to lie flat last night to sleep.  Seen in ED by cardiologist and noted that wt's are down 15 lbs since last visit but still severe DOE  Per [facility] note, pt is high surgical risk and considering TAVR. ENT note reveals nasopharyngeal mass consistent with blood clots.   Pt on phone at time of visit. Spoke with pt's niece at bedside. Noted pack of peanuts on bedside table. Meal completion 70-95%. Pt niece reports good continued good appetite. She denies pt with further needs at present.   Labs reviewed: Na: 132, CBGS: 213-219.   Diet Order:  Diet heart healthy/carb modified Room service appropriate? Yes; Fluid consistency: Thin; Fluid restriction: 2000 mL Fluid  Skin:  Reviewed, no issues  Last BM:  07/03/16  Height:   Ht Readings from Last 1 Encounters:  07/01/16 6\' 1"  (1.854 m)    Weight:   Wt Readings from Last 1 Encounters:  07/06/16 206 lb 9.1 oz (93.7 kg)    Ideal Body Weight:  83.63 kg  BMI:  Body mass index is 27.25 kg/m.  Estimated Nutritional Needs:   Kcal:  1800-2100 calories  Protein:  95-115 grams  Fluid:  >/= 1.8L  EDUCATION NEEDS:   No education needs identified at this time  Jaliah Foody A. Jimmye Norman, RD, LDN,  CDE Pager: [phone] After hours Pager: [phone]

## 2016-07-06 NOTE — Progress Notes (Addendum)
[address]       [place] [address]             [phone]        CARDIOTHORACIC SURGERY PROGRESS NOTE  Subjective: Looks and feels well.  Denies SOB.  Eating well.  Ambulating some.  Objective: Vital signs: BP Readings from Last 1 Encounters:  07/06/16 (!) 113/56   Pulse Readings from Last 1 Encounters:  07/06/16 85   Resp Readings from Last 1 Encounters:  07/06/16 (!) 21   Temp Readings from Last 1 Encounters:  07/06/16 97.9 F (36.6 C) (Oral)    Hemodynamics: CVP:  [1 mmHg-4 mmHg] 4 mmHg  Mixed venous co-ox 60.4%  Physical Exam:  Rhythm:   Afib  Breath sounds: Scattered rhonchi but overall clear  Heart sounds:  Irregular w/ systolic murmur  Incisions:  n/a  Abdomen:  soft  Extremities:  Warm, well perfused   Intake/Output from previous day: 08/22 0701 - 08/23 0700 In: 1069.6 [P.O.:160; I.V.:909.6] Out: 4025 [Urine:4025] Intake/Output this shift: Total I/O In: 10 [I.V.:10] Out: 50 [Urine:50]  Lab Results:  CBC: Recent Labs  07/05/16 0345 07/06/16 0415  WBC 7.2 8.4  HGB 13.6 14.2  HCT 41.3 41.8  PLT 220 236    BMET:  Recent Labs  07/05/16 0345 07/06/16 0415  NA 135 132*  K 4.7 4.9  CL 98* 93*  CO2 30 30  GLUCOSE 180* 190*  BUN 12 16  CREATININE 1.21 1.22  CALCIUM 9.4 9.6     PT/INR:  No results for input(s): LABPROT, INR in the last 72 hours.  CBG (last 3)   Recent Labs  07/05/16 1821 07/05/16 2102 07/06/16 0914  GLUCAP 187* 219* 213*    ABG    Component Value Date/Time   PHART 7.458 (H) 06/17/2016 1035   PCO2ART 38.7 06/17/2016 1035   PO2ART 80.0 06/17/2016 1035   HCO3 29.2 (H) 06/17/2016 1053   TCO2 31 06/17/2016 1053   ACIDBASEDEF 0.0 07/25/2011 1319   O2SAT 60.4 07/06/2016 0425    CXR: N/a  Cardiac TAVR CT  TECHNIQUE: The patient was scanned on a Philips 256 scanner. A 120 kV retrospective scan was triggered in the descending thoracic aorta at 111 HU's. Gantry rotation speed  was 270 msecs and collimation was .9 mm. 10 mg of iv Metoprolol and no nitro were given. The 3D data set was reconstructed in 5% intervals of the R-R cycle. Systolic and diastolic phases were analyzed on a dedicated work station using MPR, MIP and VRT modes. The patient received 80 cc of contrast.  FINDINGS: Aortic Valve: Trileaflet, severely thickened and calcified with severely restricted leaflet opening. There are only mild calcifications extending into the LVOT.  Aorta: Normal caliber, moderate diffuse calcifications and severe atherosclerosis on the aortic arch and descending thoracic aorta. No dissection.  Sinotubular Junction:  27 x 27 mm  Ascending Thoracic Aorta:  32 x 30 mm  Aortic Arch:  27 x 27 mm  Descending Thoracic Aorta:  28 x 27 mm  Sinus of Valsalva Measurements:  Non-coronary:  33 mm  Right -coronary:  33 mm  Left -coronary:  32 mm  Coronary Artery Height above Annulus:  Left Main:  13 mm  Right Coronary:  14 mm  Virtual Basal Annulus Measurements:  Maximum/Minimum Diameter:  29 x 24 mm  Perimeter:  92 mm  Area:  574 mm2  Optimum Fluoroscopic Angle for Delivery:  RAO 6 CRA 4  Other findings:  There is a very large left atrial appendage with no evidence of a thrombus.  Normal pulmonary vein drainage into the left atrium.  Dilated pulmonary artery measuring 32 x 29 mm suggestive of pulmonary hypertension.  IMPRESSION: 1. Trileaflet, severely thickened and calcified with severely restricted leaflet opening and annular measurements suitable for delivery of 29 mm Edward-SAPIEN TAVR valve.  2. Sufficient coronaries to annulus distance.  3. Optimum Fluoroscopic Angle for Delivery:  RAO 6 CRA 4  Ena Dawley   Electronically Signed   By: Ena Dawley   On: 07/05/2016 19:58  Assessment/Plan:   Patient continues to improve.  I have personally reviewed the patient's cardiac gated CTA heart and CTA  chest/abd/pelvis.  Cardiac gated CTA heart reveals findings c/w severe aortic stenosis w/ anatomical characteristics suitable for TAVR w/out any complicating features.  CTA chest/abd/pelvis has not yet been read by a radiologist but reveals findings c/w adequate pelvic vascular access for TAVR via transfemoral approach.  We will move forward w/ plans for TAVR once the patient has undergone stenting of right carotid artery.  Will get 2D echo today to f/u LV function and severity of MR now that hemodynamics are much improved.  Discussed at length with patient and his wife.  All questions answered.   I spent in excess of 30 minutes during the conduct of this hospital encounter and >50% of this time involved direct face-to-face encounter with the patient for counseling and/or coordination of their care.    Rexene Alberts, MD 07/06/2016 9:37 AM

## 2016-07-06 NOTE — Progress Notes (Signed)
CARDIAC REHAB PHASE I   PRE:  Rate/Rhythm: 73 afib  BP:  Supine:   Sitting: 101/58  Standing:    SaO2: 99%RA  MODE:  Ambulation: 350 ft   POST:  Rate/Rhythm: 111 afib  BP:  Supine:   Sitting: 140/57  Standing:    SaO2: 100%RA 1115-1140 Pt walked 350 ft on RA with gait belt use, rolling walker and asst x 2 with fairly steady gait. Slightly SOB during walk. Pt slow to respond to questions but think it is due to him being [facility]. He stated he felt fine during walk. To recliner after walk with daughter in room. Sats good on RA.   Graylon Good, RN BSN  07/06/2016 11:36 AM

## 2016-07-06 NOTE — Progress Notes (Signed)
Patient ID: [name] [name], male   DOB: 04-04-1932, 82 y.o.   MRN: [id number]    Advanced Heart Failure Rounding Note   Subjective:    He remains in atrial fibrillation with HR 80s-100s on amiodarone gtt and heparin gtt.  Yesterday diuresed with IV lasix. Remains on milrinone 0.25 mcg. CVP down from 11>4.   [name] Denies SOB/CP. Feeling better.   TEE (8/17): EF 30-35%, severe AS, moderate to severe MR (suspect functional MR).   Carotid dopplers > 80% RICA stenosis.  CTA head/neck with >90% (critical) RICA stenosis and [id number] LICA.   RHC/LHC 06/17/16 RA 11 PCWP 25  CO/CI 3.25/1.4   Ost Cx to Prox Cx lesion, 50 %stenosed.  Mid Cx to Dist Cx lesion, 80 %stenosed.  Mid RCA lesion, 40 %stenosed.  Mid LAD lesion, 75 %stenosed.  2nd Diag lesion, 75 %stenosed.  Hemodynamic findings consistent with moderate pulmonary hypertension.  Unable to cross the aortic valve.  PA sat 52%. CO 3.2 L/min.   Objective:   Weight Range:  Vital Signs:   Temp:  [97.4 F (36.3 C)-97.9 F (36.6 C)] 97.9 F (36.6 C) (08/23 0000) Pulse Rate:  [52-76] 73 (08/23 0800) Resp:  [15-28] 15 (08/23 0800) BP: (92-135)/(38-75) 113/56 (08/23 0800) SpO2:  [94 %-99 %] 99 % (08/23 0800) Weight:  [93 kg (205 lb)] 93 kg (205 lb) (08/23 0400) Last BM Date: 07/02/16  Weight change: Filed Weights   07/04/16 0500 07/05/16 0500 07/06/16 0400  Weight: 94.1 kg (207 lb 7.3 oz) 94.2 kg (207 lb 10.8 oz) 93 kg (205 lb)    Intake/Output:   Intake/Output Summary (Last 24 hours) at 07/06/16 0923 Last data filed at 07/06/16 0900  Gross per 24 hour  Intake           1003.8 ml  Output             3850 ml  Net          -2846.2 ml     Physical Exam: CVP 4  General:  NAD. In the chair.  HEENT: normal Neck: supple. JVP 5-6. Carotids 2+ bilat; no bruits. No thyromegaly or nodule noted. Cor: PMI nondisplaced. Irregular ate & rhythm. No S3.  2/6 systolic crescendo-decrescendo murmur with muffling of S2.   Lungs: Decreased  in bases on 4 liters  Abdomen: soft, NT, ND, no HSM. No bruits or masses. +BS  Extremities: no cyanosis, clubbing, rash,  R and LLE no edema.  Neuro: alert & orientedx3, cranial nerves grossly intact. moves all 4 extremities w/o difficulty. Affect pleasant GU: Foley   Telemetry: A fib 80-90s  Labs: Basic Metabolic Panel:  Recent Labs Lab 06/30/16 1315  07/02/16 0440 07/03/16 0446 07/04/16 0534 07/05/16 0345 07/06/16 0415  NA  --   < > 136 135 133* 135 132*  K  --   < > 4.3 4.6 4.5 4.7 4.9  CL  --   < > 96* 95* 96* 98* 93*  CO2  --   < > 28 30 28 30 30   GLUCOSE  --   < > 204* 189* 273* 180* 190*  BUN  --   < > 19 17 13 12 16   CREATININE  --   < > 1.08 1.23 1.11 1.21 1.22  CALCIUM  --   < > 9.2 9.2 9.2 9.4 9.6  MG 1.9  --   --   --   --   --   --   < > = values  in this interval not displayed.  Liver Function Tests: No results for input(s): AST, ALT, ALKPHOS, BILITOT, PROT, ALBUMIN in the last 168 hours. No results for input(s): LIPASE, AMYLASE in the last 168 hours. No results for input(s): AMMONIA in the last 168 hours.  CBC:  Recent Labs Lab 07/02/16 0440 07/03/16 0446 07/04/16 0534 07/05/16 0345 07/06/16 0415  WBC 8.1 6.4 5.5 7.2 8.4  HGB 13.6 12.7* 12.8* 13.6 14.2  HCT 41.1 39.3 39.2 41.3 41.8  MCV 90.5 90.3 90.5 90.2 88.9  PLT 230 205 199 220 236    Cardiac Enzymes: No results for input(s): CKTOTAL, CKMB, CKMBINDEX, TROPONINI in the last 168 hours.  BNP: BNP (last 3 results)  Recent Labs  05/03/16 1305 05/28/16 1617 06/28/16 1316  BNP 232.0* 294.0* 222.0*    ProBNP (last 3 results) No results for input(s): PROBNP in the last 8760 hours.    Other results:  Imaging: Ct Cardiac Morph/pulm Vein W/cm&w/o Ca Score  Result Date: 07/05/2016 CLINICAL DATA:  78-yaer-old male with severe aortic stenosis. EXAM: Cardiac TAVR CT TECHNIQUE: The patient was scanned on a Philips 256 scanner. A 120 kV retrospective scan was triggered in the descending  thoracic aorta at 111 HU's. Gantry rotation speed was 270 msecs and collimation was .9 mm. 10 mg of iv Metoprolol and no nitro were given. The 3D data set was reconstructed in 5% intervals of the R-R cycle. Systolic and diastolic phases were analyzed on a dedicated work station using MPR, MIP and VRT modes. The patient received 80 cc of contrast. FINDINGS: Aortic Valve: Trileaflet, severely thickened and calcified with severely restricted leaflet opening. There are only mild calcifications extending into the LVOT. Aorta: Normal caliber, moderate diffuse calcifications and severe atherosclerosis on the aortic arch and descending thoracic aorta. No dissection. Sinotubular Junction:  27 x 27 mm Ascending Thoracic Aorta:  32 x 30 mm Aortic Arch:  27 x 27 mm Descending Thoracic Aorta:  28 x 27 mm Sinus of Valsalva Measurements: Non-coronary:  33 mm Right -coronary:  33 mm Left -coronary:  32 mm Coronary Artery Height above Annulus: Left Main:  13 mm Right Coronary:  14 mm Virtual Basal Annulus Measurements: Maximum/Minimum Diameter:  29 x 24 mm Perimeter:  92 mm Area:  574 mm2 Optimum Fluoroscopic Angle for Delivery:  RAO 6 CRA 4 Other findings: There is a very large left atrial appendage with no evidence of a thrombus. Normal pulmonary vein drainage into the left atrium. Dilated pulmonary artery measuring 32 x 29 mm suggestive of pulmonary hypertension. IMPRESSION: 1. Trileaflet, severely thickened and calcified with severely restricted leaflet opening and annular measurements suitable for delivery of 29 mm Edward-SAPIEN TAVR valve. 2. Sufficient coronaries to annulus distance. 3. Optimum Fluoroscopic Angle for Delivery:  RAO 6 CRA 4 Ena Dawley Electronically Signed   By: Ena Dawley   On: 07/05/2016 19:58     Medications:     Scheduled Medications: . antiseptic oral rinse  7 mL Mouth Rinse BID  . aspirin  81 mg Oral Daily  . clopidogrel  75 mg Oral Daily  . digoxin  0.125 mg Oral Daily  .  furosemide  40 mg Intravenous BID  . insulin aspart  0-9 Units Subcutaneous TID WC  . potassium chloride  40 mEq Oral BID  . rosuvastatin  5 mg Oral q1800  . sodium chloride  500 mL Intravenous Once  . sodium chloride flush  10-40 mL Intracatheter Q12H  . sodium chloride flush  3 mL  Intravenous Q12H  . traZODone  100 mg Oral QHS    Infusions: . amiodarone 30 mg/hr (07/06/16 0700)  . heparin 1,400 Units/hr (07/06/16 0700)  . milrinone 0.25 mcg/kg/min (07/06/16 0700)    PRN Medications: sodium chloride, acetaminophen **OR** acetaminophen, bisacodyl, HYDROcodone-acetaminophen, nortriptyline, ondansetron **OR** ondansetron (ZOFRAN) IV, sodium chloride flush, sodium chloride flush   Assessment:   1. Cardiogenic Shock- TEE with EF 30-35% 2. Acute Respiratory Failure 3. A fib RVR 4. CAD -  2 vessel disease 5. Valvular Heart Disease-  Severe AS/moderate-severe MR (MR may be functional).  6. Caroltid Stenosis- Severe carotid stenosis RICA >80% stenosis by carotid dopplers, CTA head/neck with critical >90% RICA stenosis an [id number] LICA.  7. Bilateral Effusions  8. Nasopharyngeal mass   Plan/Discussion:    CVP down to 4. Stop IV lasix. Start lasix 40 mg twice a day. t 40 mg IV lasix twice a daily.Co-ox 60% on milrinone 0.25.  Will continue. He is on digoxin. Renal function stable. Remove foley.   HR 80s-90s on amiodarone gtt for rate control + heparin gtt.    2 vessel CAD, on ASA 81 and added low dose Crestor (prior myalgias with simvastatin).   No BB with low output. May be able to start low dose ACEI soon if creatinine remains stable.   He is too high risk for conventional surgery (low gradient severe AS and moderate to severe probably functional MR).   He is being evaluated for TAVR.  However, prior to TAVR he would need RICA severe stenosis addressed.  Suspect that this would have to be by carotid stenting.   Vascular planning for stent next week.    PT recommending HH.     Length of Stay: [place] NP-C  07/06/2016, 9:23 AM  Patient seen and examined with Darrick Grinder, NP. We discussed all aspects of the encounter. I agree with the assessment and plan as stated above.   Continues to improve. Volume status and co-ox optimized. Renal function stable.Swithc to po lasix.  CT scans reviewed and discussed with Dr Roxy Manns at bedside these are favorable for transfemoral TAVR.   I also spoke with Dr. Scot Dock. Will plane R carotid stenting on Monday. Plavix started.   Continue continue amio for PAF. May benefit from TEE & DC-CV prior to d/c.   Continue PT.   Sabriya Yono,MD 11:19 PM

## 2016-07-06 NOTE — Telephone Encounter (Signed)
New message       The nurse is calling to speak with [name] [name] to schedule an inpatient right carotid stent.  Pt is scheduled to have a TAVR next tues with Dr Roxy Manns and this procedure needs to be done first.  They are usually scheduled thru [facility]. However, she is out today. Not sure who takes [name]'s place when she is out to schedule this procedure. Please call [name].

## 2016-07-06 NOTE — Progress Notes (Signed)
ANTICOAGULATION CONSULT NOTE - Follow Up Consult  Pharmacy Consult for Heparin Indication: atrial fibrillation  Allergies  Allergen Reactions  . Ambien [Zolpidem Tartrate] Other (See Comments)    Sleep walks  . Cholestatin   . Lipitor [Atorvastatin Calcium] Other (See Comments)    myalgias  . Ranitidine Other (See Comments)    Chest discomfort  . Simvastatin Other (See Comments)    Myalgias  . Xanax Xr [Alprazolam Er]     Tightness in chest    Patient Measurements: Height: 6\' 1"  (185.4 cm) Weight: 206 lb 9.1 oz (93.7 kg) IBW/kg (Calculated) : 79.9  Vital Signs: Temp: 93.1 F (33.9 C) (08/23 1138) BP: 140/57 (08/23 1138) Pulse Rate: 76 (08/23 1138)  Labs:  Recent Labs  07/04/16 0533  07/04/16 0534 07/05/16 0345 07/06/16 0415  HGB  --   < > 12.8* 13.6 14.2  HCT  --   --  39.2 41.3 41.8  PLT  --   --  199 220 236  HEPARINUNFRC 0.53  --   --  0.40 0.52  CREATININE  --   --  1.11 1.21 1.22  < > = values in this interval not displayed.  Estimated Creatinine Clearance: 52.8 mL/min (by C-G formula based on SCr of 1.22 mg/dL).  Medications: Heparin @ 1400 units/hr  Assessment: 83yom continues on heparin for afib (while coumadin on hold) in the setting of cardiogenic shock. Heparin level remains therapeutic at 0.52. CBC stable. No bleeding reported. Noted plan for carotid stent and then TAVR next week.  Goal of Therapy:  Heparin level 0.3-0.7 units/ml Monitor platelets by anticoagulation protocol: Yes   Plan:  1) Continue heparin at 1400 units/hr 2) Daily heparin level, CBC 3) Follow up timing of procedures   Nena Jordan, PharmD, BCPS 07/06/2016 12:16 PM

## 2016-07-07 ENCOUNTER — Other Ambulatory Visit (HOSPITAL_COMMUNITY): Payer: Medicare Other

## 2016-07-07 ENCOUNTER — Inpatient Hospital Stay (HOSPITAL_COMMUNITY): Payer: Medicare Other

## 2016-07-07 DIAGNOSIS — I5043 Acute on chronic combined systolic (congestive) and diastolic (congestive) heart failure: Secondary | ICD-10-CM

## 2016-07-07 DIAGNOSIS — I350 Nonrheumatic aortic (valve) stenosis: Secondary | ICD-10-CM

## 2016-07-07 LAB — CBC
HCT: 42.7 % (ref 39.0–52.0)
Hemoglobin: 14.3 g/dL (ref 13.0–17.0)
MCH: 29.9 pg (ref 26.0–34.0)
MCHC: 33.5 g/dL (ref 30.0–36.0)
MCV: 89.1 fL (ref 78.0–100.0)
PLATELETS: 228 10*3/uL (ref 150–400)
RBC: 4.79 MIL/uL (ref 4.22–5.81)
RDW: 14.4 % (ref 11.5–15.5)
WBC: 7.6 10*3/uL (ref 4.0–10.5)

## 2016-07-07 LAB — BASIC METABOLIC PANEL
ANION GAP: 8 (ref 5–15)
BUN: 20 mg/dL (ref 6–20)
CALCIUM: 9.4 mg/dL (ref 8.9–10.3)
CHLORIDE: 93 mmol/L — AB (ref 101–111)
CO2: 31 mmol/L (ref 22–32)
Creatinine, Ser: 1.18 mg/dL (ref 0.61–1.24)
GFR calc non Af Amer: 56 mL/min — ABNORMAL LOW (ref >60)
Glucose, Bld: 248 mg/dL — ABNORMAL HIGH (ref 65–99)
POTASSIUM: 4.8 mmol/L (ref 3.5–5.1)
Sodium: 132 mmol/L — ABNORMAL LOW (ref 135–145)

## 2016-07-07 LAB — CARBOXYHEMOGLOBIN
Carboxyhemoglobin: 1.3 % (ref 0.5–1.5)
Methemoglobin: 0.7 % (ref 0.0–1.5)
O2 Saturation: 56.1 %
TOTAL HEMOGLOBIN: 15 g/dL (ref 13.5–18.0)

## 2016-07-07 LAB — GLUCOSE, CAPILLARY
GLUCOSE-CAPILLARY: 209 mg/dL — AB (ref 65–99)
GLUCOSE-CAPILLARY: 232 mg/dL — AB (ref 65–99)
Glucose-Capillary: 224 mg/dL — ABNORMAL HIGH (ref 65–99)
Glucose-Capillary: 241 mg/dL — ABNORMAL HIGH (ref 65–99)
Glucose-Capillary: 252 mg/dL — ABNORMAL HIGH (ref 65–99)

## 2016-07-07 LAB — ECHOCARDIOGRAM COMPLETE
HEIGHTINCHES: 73 in
WEIGHTICAEL: 3305.14 [oz_av]

## 2016-07-07 LAB — HEPARIN LEVEL (UNFRACTIONATED): Heparin Unfractionated: 0.58 IU/mL (ref 0.30–0.70)

## 2016-07-07 MED ORDER — PREDNISONE 50 MG PO TABS: 25.0000 mg | ORAL_TABLET | Freq: Two times a day (BID) | ORAL | Status: DC

## 2016-07-07 MED ORDER — DIPHENHYDRAMINE HCL 25 MG PO CAPS
25.0000 mg | ORAL_CAPSULE | Freq: Three times a day (TID) | ORAL | Status: DC | PRN
  Administered 2016-07-07 – 2016-07-18 (×10): 25 mg via ORAL
  Filled 2016-07-07 (×11): qty 1

## 2016-07-07 MED ORDER — PERFLUTREN LIPID MICROSPHERE
1.0000 mL | INTRAVENOUS | Status: AC | PRN
  Administered 2016-07-07: 2 mL via INTRAVENOUS
  Filled 2016-07-07: qty 10

## 2016-07-07 MED ORDER — PREDNISONE 20 MG PO TABS
30.0000 mg | ORAL_TABLET | Freq: Two times a day (BID) | ORAL | Status: DC
  Administered 2016-07-07: 30 mg via ORAL
  Filled 2016-07-07: qty 1

## 2016-07-07 MED ORDER — PREDNISONE 10 MG PO TABS: 10.0000 mg | ORAL_TABLET | Freq: Two times a day (BID) | ORAL | Status: DC

## 2016-07-07 MED ORDER — PREDNISONE 20 MG PO TABS: 20.0000 mg | ORAL_TABLET | Freq: Two times a day (BID) | ORAL | Status: DC

## 2016-07-07 MED ORDER — PREDNISONE 10 MG PO TABS: 15.0000 mg | ORAL_TABLET | Freq: Two times a day (BID) | ORAL | Status: DC

## 2016-07-07 NOTE — Progress Notes (Signed)
Patient ID: [name] [name], male   DOB: 20-Sep-1932, 82 y.o.   MRN: [id number]    Advanced Heart Failure Rounding Note   Subjective:    He remains in atrial fibrillation with HR 80s-100s on amiodarone gtt and heparin gtt.  Yesterday diuresed with IV lasix. Remains on milrinone 0.25 mcg. CVP 5.   . Complaining of itching on arms and back. Started 24 hour ago. Denies SOB.    TEE (8/17): EF 30-35%, severe AS, moderate to severe MR (suspect functional MR).   Carotid dopplers > 80% RICA stenosis.  CTA head/neck with >90% (critical) RICA stenosis and [id number] LICA.   RHC/LHC 06/17/16 RA 11 PCWP 25  CO/CI 3.25/1.4   Ost Cx to Prox Cx lesion, 50 %stenosed.  Mid Cx to Dist Cx lesion, 80 %stenosed.  Mid RCA lesion, 40 %stenosed.  Mid LAD lesion, 75 %stenosed.  2nd Diag lesion, 75 %stenosed.  Hemodynamic findings consistent with moderate pulmonary hypertension.  Unable to cross the aortic valve.  PA sat 52%. CO 3.2 L/min.   Objective:   Weight Range:  Vital Signs:   Temp:  [97.4 F (36.3 C)-98.7 F (37.1 C)] 98.7 F (37.1 C) (08/24 1131) Pulse Rate:  [56-95] 78 (08/24 1131) Resp:  [14-21] 16 (08/24 1131) BP: (108-149)/(43-89) 134/62 (08/24 1131) SpO2:  [70 %-100 %] 100 % (08/24 1131) Last BM Date: 07/03/16  Weight change: Filed Weights   07/05/16 0500 07/06/16 0400 07/06/16 0900  Weight: 207 lb 10.8 oz (94.2 kg) 205 lb (93 kg) 206 lb 9.1 oz (93.7 kg)    Intake/Output:   Intake/Output Summary (Last 24 hours) at 07/07/16 1142 Last data filed at 07/07/16 0900  Gross per 24 hour  Intake            833.8 ml  Output             1600 ml  Net           -766.2 ml     Physical Exam: CVP 5 General:  NAD. In the bed .  HEENT: normal Neck: supple. JVP 5-6. Carotids 2+ bilat; no bruits. No thyromegaly or nodule noted. Cor: PMI nondisplaced. Irregular ate & rhythm. No S3.  2/6 systolic crescendo-decrescendo murmur with muffling of S2.   Lungs: Decreased in bases on 4 liters    Abdomen: soft, NT, ND, no HSM. No bruits or masses. +BS  Extremities: no cyanosis, clubbing, rash,  R and LLE no edema.  Neuro: alert & orientedx3, cranial nerves grossly intact. moves all 4 extremities w/o difficulty. Affect pleasant Skin: Red rash on back   Telemetry: A fib 80-90s  Labs: Basic Metabolic Panel:  Recent Labs Lab 06/30/16 1315  07/03/16 0446 07/04/16 0534 07/05/16 0345 07/06/16 0415 07/07/16 0432  NA  --   < > 135 133* 135 132* 132*  K  --   < > 4.6 4.5 4.7 4.9 4.8  CL  --   < > 95* 96* 98* 93* 93*  CO2  --   < > 30 28 30 30 31   GLUCOSE  --   < > 189* 273* 180* 190* 248*  BUN  --   < > 17 13 12 16 20   CREATININE  --   < > 1.23 1.11 1.21 1.22 1.18  CALCIUM  --   < > 9.2 9.2 9.4 9.6 9.4  MG 1.9  --   --   --   --   --   --   < > =  values in this interval not displayed.  Liver Function Tests: No results for input(s): AST, ALT, ALKPHOS, BILITOT, PROT, ALBUMIN in the last 168 hours. No results for input(s): LIPASE, AMYLASE in the last 168 hours. No results for input(s): AMMONIA in the last 168 hours.  CBC:  Recent Labs Lab 07/03/16 0446 07/04/16 0534 07/05/16 0345 07/06/16 0415 07/07/16 0432  WBC 6.4 5.5 7.2 8.4 7.6  HGB 12.7* 12.8* 13.6 14.2 14.3  HCT 39.3 39.2 41.3 41.8 42.7  MCV 90.3 90.5 90.2 88.9 89.1  PLT 205 199 220 236 228    Cardiac Enzymes: No results for input(s): CKTOTAL, CKMB, CKMBINDEX, TROPONINI in the last 168 hours.  BNP: BNP (last 3 results)  Recent Labs  05/03/16 1305 05/28/16 1617 06/28/16 1316  BNP 232.0* 294.0* 222.0*    ProBNP (last 3 results) No results for input(s): PROBNP in the last 8760 hours.    Other results:  Imaging: Ct Cardiac Morph/pulm Vein W/cm&w/o Ca Score  Addendum Date: 07/07/2016   ADDENDUM REPORT: 07/07/2016 09:31 EXAM: OVER-READ INTERPRETATION  CT CHEST The following report is an over-read performed by radiologist Dr. Barnetta Hammersmith [facility] on 07/07/2016. This over-read  does not include interpretation of cardiac or coronary anatomy or pathology. The coronary CTA/TAVR CTA interpretation by the cardiologist is attached. COMPARISON:  None. FINDINGS: Interstitial edema present with moderate sized right pleural effusion and tiny left pleural effusion. No incidental pulmonary nodules or enlarged lymph nodes identified. Atherosclerosis of the aortic arch and descending thoracic aorta present without evidence of aneurysmal disease. Proximal great vessels show normal branching anatomy and no significant obstructive disease. Visualized bony structures are unremarkable. Visualized upper abdominal structures are unremarkable. IMPRESSION: Interstitial pulmonary edema with moderate right pleural effusion and small left pleural effusion. Electronically Signed   By: Aletta Edouard M.D.   On: 07/07/2016 09:31   Result Date: 07/07/2016 CLINICAL DATA:  8-yaer-old male with severe aortic stenosis. EXAM: Cardiac TAVR CT TECHNIQUE: The patient was scanned on a Philips 256 scanner. A 120 kV retrospective scan was triggered in the descending thoracic aorta at 111 HU's. Gantry rotation speed was 270 msecs and collimation was .9 mm. 10 mg of iv Metoprolol and no nitro were given. The 3D data set was reconstructed in 5% intervals of the R-R cycle. Systolic and diastolic phases were analyzed on a dedicated work station using MPR, MIP and VRT modes. The patient received 80 cc of contrast. FINDINGS: Aortic Valve: Trileaflet, severely thickened and calcified with severely restricted leaflet opening. There are only mild calcifications extending into the LVOT. Aorta: Normal caliber, moderate diffuse calcifications and severe atherosclerosis on the aortic arch and descending thoracic aorta. No dissection. Sinotubular Junction:  27 x 27 mm Ascending Thoracic Aorta:  32 x 30 mm Aortic Arch:  27 x 27 mm Descending Thoracic Aorta:  28 x 27 mm Sinus of Valsalva Measurements: Non-coronary:  33 mm Right -coronary:  33  mm Left -coronary:  32 mm Coronary Artery Height above Annulus: Left Main:  13 mm Right Coronary:  14 mm Virtual Basal Annulus Measurements: Maximum/Minimum Diameter:  29 x 24 mm Perimeter:  92 mm Area:  574 mm2 Optimum Fluoroscopic Angle for Delivery:  RAO 6 CRA 4 Other findings: There is a very large left atrial appendage with no evidence of a thrombus. Normal pulmonary vein drainage into the left atrium. Dilated pulmonary artery measuring 32 x 29 mm suggestive of pulmonary hypertension. IMPRESSION: 1. Trileaflet, severely thickened and calcified with severely restricted leaflet opening  and annular measurements suitable for delivery of 29 mm Edward-SAPIEN TAVR valve. 2. Sufficient coronaries to annulus distance. 3. Optimum Fluoroscopic Angle for Delivery:  RAO 6 CRA 4 Ena Dawley Electronically Signed: By: Ena Dawley On: 07/05/2016 19:58     Medications:     Scheduled Medications: . antiseptic oral rinse  7 mL Mouth Rinse BID  . aspirin  81 mg Oral Daily  . clopidogrel  75 mg Oral Daily  . digoxin  0.125 mg Oral Daily  . furosemide  40 mg Oral BID  . insulin aspart  0-9 Units Subcutaneous TID WC  . potassium chloride  40 mEq Oral BID  . rosuvastatin  5 mg Oral q1800  . sodium chloride  500 mL Intravenous Once  . sodium chloride flush  10-40 mL Intracatheter Q12H  . sodium chloride flush  3 mL Intravenous Q12H  . traZODone  100 mg Oral QHS    Infusions: . amiodarone 30 mg/hr (07/06/16 2000)  . heparin 1,400 Units/hr (07/07/16 1050)  . milrinone 0.25 mcg/kg/min (07/07/16 0149)    PRN Medications: sodium chloride, acetaminophen **OR** acetaminophen, bisacodyl, HYDROcodone-acetaminophen, nortriptyline, ondansetron **OR** ondansetron (ZOFRAN) IV, sodium chloride flush, sodium chloride flush   Assessment:   1. Cardiogenic Shock- TEE with EF 30-35% 2. Acute Respiratory Failure 3. A fib RVR 4. CAD -  2 vessel disease 5. Valvular Heart Disease-  Severe AS/moderate-severe MR  (MR may be functional).  6. Caroltid Stenosis- Severe carotid stenosis RICA >80% stenosis by carotid dopplers, CTA head/neck with critical >90% RICA stenosis an [id number] LICA.  7. Bilateral Effusions  8. Nasopharyngeal mass 9. Rash-   Plan/Discussion:    CVP 5. Continue  lasix 40 mg twice a day. Co-ox 56% on milrinone 0.25.  Will continue. He is on digoxin. Renal function stable.   HR 80s-90s on amiodarone gtt for rate control + heparin gtt.    2 vessel CAD, on ASA 81 and added low dose Crestor (prior myalgias with simvastatin).   No BB with low output. May be able to start low dose ACEI soon if creatinine remains stable.   He is too high risk for conventional surgery (low gradient severe AS and moderate to severe probably functional MR).   He is being evaluated for TAVR.  However, prior to TAVR he would need RICA severe stenosis addressed.  Suspect that this would have to be by carotid stenting.   Vascular planning for stent Monday.    Rash--> back . Onset 48 hours. ? Plavix which was started 48 hours ago or possible digoxin but this was started 8/18. Dig level 0.3. [name] Check CBC diff. Discussed with pharmacy. Will try benadryl and prednisone to this helps.   PT recommending HH.    Length of Stay: [place] NP-C  07/07/2016, 11:42 AM  Patient seen and examined with Darrick Grinder, NP. We discussed all aspects of the encounter. I agree with the assessment and plan as stated above.   He continues to improve. Walked several times today. Cardiac output and volume status stable on milrinone. Will continue. Plan carotid stent on Monday followed by TAVR  8 days later. He has drug rash likely to Palvix. Will switch to Brilinta. Continue amio and heparin for AF. May benefit from TEE/DC-CV prior to d/c. D/w VVS and TCTS.   Bensimhon, Daniel,MD 9:40 PM

## 2016-07-07 NOTE — Progress Notes (Addendum)
      [address]       [place] [address]             [phone]        CARDIOTHORACIC SURGERY PROGRESS NOTE  Subjective: No complaints  Objective: Vital signs: BP Readings from Last 1 Encounters:  07/07/16 (!) 121/97   Pulse Readings from Last 1 Encounters:  07/07/16 (!) 44   Resp Readings from Last 1 Encounters:  07/07/16 14   Temp Readings from Last 1 Encounters:  07/07/16 98.7 F (37.1 C) (Oral)    Hemodynamics: CVP:  [5 mmHg-9 mmHg] 5 mmHg  Mixed venous co-ox 56.1%  Physical Exam:  Rhythm:   Afib  Breath sounds: Scattered rhonchi  Heart sounds:  Irregular w/ systolic murmur  Incisions:  n/a  Abdomen:  soft  Extremities:  warm   Intake/Output from previous day: 08/23 0701 - 08/24 0700 In: 919.6 [I.V.:919.6] Out: 1650 [Urine:1650] Intake/Output this shift: Total I/O In: 234.2 [I.V.:234.2] Out: -   Lab Results:  CBC: Recent Labs  07/06/16 0415 07/07/16 0432  WBC 8.4 7.6  HGB 14.2 14.3  HCT 41.8 42.7  PLT 236 228    BMET:  Recent Labs  07/06/16 0415 07/07/16 0432  NA 132* 132*  K 4.9 4.8  CL 93* 93*  CO2 30 31  GLUCOSE 190* 248*  BUN 16 20  CREATININE 1.22 1.18  CALCIUM 9.6 9.4     PT/INR:  No results for input(s): LABPROT, INR in the last 72 hours.  CBG (last 3)   Recent Labs  07/07/16 0803 07/07/16 1123 07/07/16 1129  GLUCAP 224* 232* 209*    ABG    Component Value Date/Time   PHART 7.458 (H) 06/17/2016 1035   PCO2ART 38.7 06/17/2016 1035   PO2ART 80.0 06/17/2016 1035   HCO3 29.2 (H) 06/17/2016 1053   TCO2 31 06/17/2016 1053   ACIDBASEDEF 0.0 07/25/2011 1319   O2SAT 56.1 07/07/2016 0452    CXR: n/a  Assessment/Plan: S/P Procedure(s) (LRB): Carotid PTA/Stent Intervention (N/A)  Mr Rindfleisch remains clinically stable on low dose milrinone.  Follow up ECHO pending.  Carotid stent procedure has been scheduled for Monday 8/28  I have concerns about whether or not it would be wise to proceed  with TAVR <24 hours after relatively high risk carotid stent procedure, even if it goes smoothly.  At the very least the patient will have femoral arterial access, IV contrast exposure and the peri-procedural risk of stroke.  Not to mention concerns regarding the patient's severe LV dysfunction and CHF.  Discussed with [name] and we both favor delaying TAVR for at least a few days after the carotid stent.  Will tentatively plan TAVR for Tuesday 9/5.  Discussed at length with the patient and his family.  I spent in excess of 15 minutes during the conduct of this hospital encounter and >50% of this time involved direct face-to-face encounter with the patient for counseling and/or coordination of their care.   Rexene Alberts, MD 07/07/2016 2:24 PM

## 2016-07-07 NOTE — Progress Notes (Signed)
CARDIAC REHAB PHASE I   PRE:  Rate/Rhythm: 90 afib    BP: sitting 133/68    SaO2: 87 RA in bed, 93 1L  MODE:  Ambulation: 270 ft   POST:  Rate/Rhythm: 110 afib    BP: sitting 132/72     SaO2: 93 1L  Pt asleep upon arrival, niece sts he has slept all day. Easily awoke. SaO2 lower today, on 1L O2 but out of his nose. Initially did not feel up to walking however vascular surgeon encouraged him.  Pt weaker today, less steady on feet. Seemed to have less control of right leg placement. No c/o however. To recliner to eat, niece with him. Will continue to follow. Left on 1/2 L O2. [id number]  Christell Constant Terrika Zuver CES, ACSM 07/07/2016 1:50 PM

## 2016-07-07 NOTE — Progress Notes (Addendum)
   VASCULAR SURGERY ASSESSMENT & PLAN:  For right carotid stent Monday AM by Dr. Oneida Alar Dr. Gwenlyn Found.   On Brillinta because of allergy to Plavix. Discussed the procedure and risks with patient, including risk of groin complication or peri-procedural stroke (1-2% risk).  Dr. Roxy Manns plans TAVR on Tuesday 07/19/16.   SUBJECTIVE: No complaints  PHYSICAL EXAM: Vitals:   07/07/16 0800 07/07/16 1125 07/07/16 1131 07/07/16 1200  BP: (!) 121/43 (!) 149/75 134/62 (!) 121/97  Pulse: 71 90 78 (!) 44  Resp: (!) 21 20 16 14   Temp: 97.4 F (36.3 C) 97.6 F (36.4 C) 98.7 F (37.1 C)   TempSrc: Oral Oral Oral   SpO2: 94% 92% 100% 91%  Weight:      Height:       Neuro intact Palpable right femoral pulse  LABS: Lab Results  Component Value Date   WBC 7.6 07/07/2016   HGB 14.3 07/07/2016   HCT 42.7 07/07/2016   MCV 89.1 07/07/2016   PLT 228 07/07/2016   Lab Results  Component Value Date   CREATININE 1.18 07/07/2016   Lab Results  Component Value Date   INR 1.53 06/30/2016   CBG (last 3)   Recent Labs  07/07/16 0803 07/07/16 1123 07/07/16 1129  GLUCAP 224* 232* 209*    Principal Problem:   Cardiogenic shock (HCC) Active Problems:   Hypertension   Cerebrovascular disease   Atrial fibrillation ([place])   Hyperlipidemia   Aortic stenosis   Diabetes mellitus (HCC)   Carotid stenosis   Anemia of chronic disease   Systolic congestive heart failure (HCC)   Dyspnea   CAD (coronary artery disease), native coronary artery   Acute on chronic combined systolic and diastolic CHF, NYHA class 4 ([place])   Acute respiratory failure with hypoxia ([place])    Gae Gallop [id number] 07/07/2016

## 2016-07-07 NOTE — Consult Note (Signed)
[name]       [place] [address]             [phone]      Cardiothoracic Surgery Consultation   Reason for Consult: Severe aortic stenosis Referring Physician: Dr. Salvadore Farber [name] [name] is an 82 y.o. male.  HPI:   The patient is an 82 year old gentleman with a history of aortic stenosis, chronic atrial fibrillation on warfarin, diabetes, hypertension, hyperlipidemia and cerebrovascular disease who was recently diagnosed with low gradient, low EF severe AS with acute on chronic systolic heart failure and cardiogenic shock. He had known moderate AS by echo in 03/2012 with a mean gradient of 18 mm Hg and an LVEF of 50-55%. Over the past 4-5 years he has had a gradual decline in is exercise tolerance with exertional fatigue and shortness of breath. He has noted a more progressive decline over the past 6-12 months with severe exertional dyspnea, occasional resting dyspnea, profound fatigue and intermittent dizziness. He has had some lower extremity edema. He denies chest pain or pressure. He has an echo on 06/01/2016 that showed severe low gradient low EF AS with a mean gradient of 17 mm Hg and a DI of 0.16. His LVEF had declined to 35-40% with global hypokinesis. There was also moderate to severe MR, mild to moderate TR and moderate pulmonary hypertension. TEE on 06/17/2016 showed an LVEF of 30-35% with severe AS with a mean gradient of 22 mm Hg and a DI of 0.2. The aortic valve leaflets are severely thickened and calcified with fusion of the left and non-coronary leaflets and poor leaflet separation. There was moderate to severe MR. Cardiac cath the same day showed moderate disease with 75% mid LAD and 80% distal LCX stenoses. RHC showed moderate pulmonary HTN at 52/23 with a wedge of 25, low CO of 3.2 and MVO2 of 52%. He was referred for surgical consultation but before that occurred he was admitted with progression of shortness breath and orthopnea and early  cardiogenic shock with NYHA class IV symptoms. He also had a carotid duplex on 8/9 showing high grade RICA stenosis. Since admission he was started on milrinone 0.125 with a persistent low Co-ox of 48% and the milrinone was increased to 0.25. His Co-ox increased to 62%. He has been evaluated by Dr. Roxy Manns for consideration of TAVR. He has been evaluated by vascular surgery and right carotid stenting planned for Monday next week.  Past Medical History:  Diagnosis Date  . Aortic stenosis    a. mod-sev by echo 05/2016.  . Asthma   . Cerebrovascular disease 07/2010   TIA; carotid ultrasound in 07/2010-significant bilateral plaque without focal internal carotid artery stenosis; MRI -encephalomalacia left temporal and right temporal lobes; small inferior right cerebellar infarct; small vessel disease  . Chronic atrial fibrillation (HCC)    Paroxysmal; Echocardiogram in 2007-normal EF; mild LVH; left atrial enlargement; mild stenosis and minimal AI; negative stress nuclear study in 2008  . Chronic systolic CHF (congestive heart failure) ([place])    a. dx 05/2016 - EF 35-40%, diffuse HK, mod-severe AS, mod gradient, severe AVA VTI likely due to decreased cardiac output in setting of systolic dysfsunction and significant mitral regurgitation, mild MR, mod-severe MR, severe LAE, mild-mod RV dilation, mild RAE, mild-mod TR, mod PASP 37mHg.  . CKD (chronic kidney disease), stage II   . Degenerative joint disease    feet and legs  . Diabetes mellitus  no insulin; A1c of 6.6 in 2005  . Dizziness    occurs daily,especially in am  . Exertional dyspnea   . Gastroesophageal reflux disease   . Hepatic steatosis   . History of noncompliance with medical treatment   . Hyperlipidemia    adverse reactions to statins and niacin  . Hypertension    Borderline  . Irregular heartbeat   . Mitral regurgitation    a. mod-sev by echo 05/2016  . Peripheral vascular disease ([place])   . Renal insufficiency   . Temporal  arteritis ([place])   . Tricuspid regurgitation    a. mild-mod TR by echo 05/2016    Past Surgical History:  Procedure Laterality Date  . COLONOSCOPY  2002  . COLONOSCOPY  01/19/2012   Procedure: COLONOSCOPY;  Surgeon: Rogene Houston, MD;  [facility];  Service: Endoscopy;  Laterality: N/A;  1030  . LIPOMA EXCISION  1980  . ORIF ANKLE FRACTURE  2000   Right  . PROSTATE SURGERY  12/2011  . ROTATOR CUFF REPAIR     Right  . TEE WITHOUT CARDIOVERSION N/A 06/17/2016   Procedure: TRANSESOPHAGEAL ECHOCARDIOGRAM (TEE);  Surgeon: Jerline Pain, MD;  Location: [facility];  Service: Cardiovascular;  Laterality: N/A;  . TRANSURETHRAL RESECTION OF PROSTATE  09/2011  . URETHRAL STRICTURE DILATATION  1980s    Family History  Problem Relation Age of Onset  . Stroke Mother   . Diabetes Father   . Colon cancer Neg Hx     Social History:  reports that he quit smoking about 24 years ago. His smoking use included Cigarettes. He started smoking about 66 years ago. He has a 20.00 pack-year smoking history. His smokeless tobacco use includes Chew. He reports that he does not drink alcohol or use drugs.  Allergies:  Allergies  Allergen Reactions  . Ambien [Zolpidem Tartrate] Other (See Comments)    Sleep walks  . Cholestatin   . Lipitor [Atorvastatin Calcium] Other (See Comments)    myalgias  . Ranitidine Other (See Comments)    Chest discomfort  . Simvastatin Other (See Comments)    Myalgias  . Xanax Xr [Alprazolam Er]     Tightness in chest    Medications:  I have reviewed the patient's current medications. Prior to Admission:  Facility-Administered Medications Prior to Admission  Medication Dose Route Frequency Provider Last Rate Last Dose  . insulin aspart (novoLOG) injection 0-9 Units  0-9 Units Subcutaneous Q4H Dayna N Dunn, PA-C       Prescriptions Prior to Admission  Medication Sig Dispense Refill Last Dose  . albuterol (PROVENTIL HFA;VENTOLIN HFA) 108 (90 Base) MCG/ACT  inhaler Inhale 1-2 puffs into the lungs 5 (five) times daily as needed for wheezing or shortness of breath. 1 Inhaler 0 06/28/2016 at Unknown time  . furosemide (LASIX) 20 MG tablet Take 2 tablets (40 mg total) by mouth daily. 180 tablet 3 06/28/2016 at Unknown time  . HYDROcodone-acetaminophen (NORCO/VICODIN) 5-325 MG tablet Take 1 tablet by mouth 3 (three) times daily as needed for moderate pain. 90 tablet 0 06/28/2016 at Unknown time  . metFORMIN (GLUCOPHAGE) 500 MG tablet 1/2 tablet twice a day 30 tablet 5 06/28/2016 at Unknown time  . metoprolol succinate (TOPROL-XL) 25 MG 24 hr tablet Take 0.5 tablets (12.5 mg total) by mouth daily. 90 tablet 3 06/28/2016 at 0800  . nortriptyline (PAMELOR) 10 MG capsule TAKE 1 TO 2 CAPSULES AT BEDTIME FOR BURNING IN FEET. 60 capsule 5 06/27/2016 at Unknown time  .  traZODone (DESYREL) 100 MG tablet Take 1 tablet (100 mg total) by mouth at bedtime. 30 tablet 5 06/27/2016 at Unknown time  . warfarin (COUMADIN) 5 MG tablet Take 1 tablet (5 mg total) by mouth daily. TAKE AS DIRECTED BY PHYSICIAN (Patient taking differently: Take 2.5-5 mg by mouth See admin instructions. TAKE 21m daily except take 2.5109m(1/2 tab) on Tuesday, Thursday and Saturday.) 30 tablet 5 06/28/2016 at 0800   Scheduled: . antiseptic oral rinse  7 mL Mouth Rinse BID  . aspirin  81 mg Oral Daily  . clopidogrel  75 mg Oral Daily  . digoxin  0.125 mg Oral Daily  . furosemide  40 mg Oral BID  . insulin aspart  0-9 Units Subcutaneous TID WC  . potassium chloride  40 mEq Oral BID  . predniSONE  30 mg Oral BID WC   Followed by  . [START ON 07/10/2016] predniSONE  25 mg Oral BID WC   Followed by  . [START ON 07/13/2016] predniSONE  20 mg Oral BID WC   Followed by  . [START ON 07/16/2016] predniSONE  15 mg Oral BID WC   Followed by  . [START ON 07/19/2016] predniSONE  10 mg Oral BID WC  . rosuvastatin  5 mg Oral q1800  . sodium chloride  500 mL Intravenous Once  . sodium chloride flush  10-40 mL  Intracatheter Q12H  . sodium chloride flush  3 mL Intravenous Q12H  . traZODone  100 mg Oral QHS   Continuous: . amiodarone 30 mg/hr (07/06/16 2000)  . heparin 1,400 Units/hr (07/07/16 1050)  . milrinone 0.25 mcg/kg/min (07/07/16 1500)   PREXN:TZGYFVhloride, acetaminophen **OR** acetaminophen, bisacodyl, diphenhydrAMINE, HYDROcodone-acetaminophen, nortriptyline, ondansetron **OR** ondansetron (ZOFRAN) IV, perflutren lipid microspheres (DEFINITY) IV suspension, sodium chloride flush, sodium chloride flush Anti-infectives    None      Results for orders placed or performed during the hospital encounter of 06/28/16 (from the past 48 hour(s))  Glucose, capillary     Status: Abnormal   Collection Time: 07/05/16  6:21 PM  Result Value Ref Range   Glucose-Capillary 187 (H) 65 - 99 mg/dL  Glucose, capillary     Status: Abnormal   Collection Time: 07/05/16  9:02 PM  Result Value Ref Range   Glucose-Capillary 219 (H) 65 - 99 mg/dL   Comment 1 Capillary Specimen   CBC     Status: None   Collection Time: 07/06/16  4:15 AM  Result Value Ref Range   WBC 8.4 4.0 - 10.5 K/uL   RBC 4.70 4.22 - 5.81 MIL/uL   Hemoglobin 14.2 13.0 - 17.0 g/dL   HCT 41.8 39.0 - 52.0 %   MCV 88.9 78.0 - 100.0 fL   MCH 30.2 26.0 - 34.0 pg   MCHC 34.0 30.0 - 36.0 g/dL   RDW 14.5 11.5 - 15.5 %   Platelets 236 150 - 400 K/uL  Heparin level (unfractionated)     Status: None   Collection Time: 07/06/16  4:15 AM  Result Value Ref Range   Heparin Unfractionated 0.52 0.30 - 0.70 IU/mL    Comment:        IF HEPARIN RESULTS ARE BELOW EXPECTED VALUES, AND PATIENT DOSAGE HAS BEEN CONFIRMED, SUGGEST FOLLOW UP TESTING OF ANTITHROMBIN III LEVELS.   Basic metabolic panel     Status: Abnormal   Collection Time: 07/06/16  4:15 AM  Result Value Ref Range   Sodium 132 (L) 135 - 145 mmol/L   Potassium 4.9 3.5 - 5.1  mmol/L   Chloride 93 (L) 101 - 111 mmol/L   CO2 30 22 - 32 mmol/L   Glucose, Bld 190 (H) 65 - 99 mg/dL    BUN 16 6 - 20 mg/dL   Creatinine, Ser 1.22 0.61 - 1.24 mg/dL   Calcium 9.6 8.9 - 10.3 mg/dL   GFR calc non Af Amer 53 (L) >60 mL/min   GFR calc Af Amer >60 >60 mL/min    Comment: (NOTE) The eGFR has been calculated using the CKD EPI equation. This calculation has not been validated in all clinical situations. eGFR's persistently <60 mL/min signify possible Chronic Kidney Disease.    Anion gap 9 5 - 15  Carboxyhemoglobin     Status: None   Collection Time: 07/06/16  4:25 AM  Result Value Ref Range   Total hemoglobin 14.4 13.5 - 18.0 g/dL   O2 Saturation 60.4 %   Carboxyhemoglobin 1.3 0.5 - 1.5 %   Methemoglobin 0.8 0.0 - 1.5 %  Glucose, capillary     Status: Abnormal   Collection Time: 07/06/16  9:14 AM  Result Value Ref Range   Glucose-Capillary 213 (H) 65 - 99 mg/dL  Glucose, capillary     Status: Abnormal   Collection Time: 07/06/16 11:22 AM  Result Value Ref Range   Glucose-Capillary 214 (H) 65 - 99 mg/dL  Glucose, capillary     Status: Abnormal   Collection Time: 07/06/16  3:39 PM  Result Value Ref Range   Glucose-Capillary 201 (H) 65 - 99 mg/dL   Comment 1 Notify RN   Glucose, capillary     Status: Abnormal   Collection Time: 07/06/16  9:47 PM  Result Value Ref Range   Glucose-Capillary 169 (H) 65 - 99 mg/dL   Comment 1 Capillary Specimen   Heparin level (unfractionated)     Status: None   Collection Time: 07/07/16  4:28 AM  Result Value Ref Range   Heparin Unfractionated 0.58 0.30 - 0.70 IU/mL    Comment:        IF HEPARIN RESULTS ARE BELOW EXPECTED VALUES, AND PATIENT DOSAGE HAS BEEN CONFIRMED, SUGGEST FOLLOW UP TESTING OF ANTITHROMBIN III LEVELS.   CBC     Status: None   Collection Time: 07/07/16  4:32 AM  Result Value Ref Range   WBC 7.6 4.0 - 10.5 K/uL   RBC 4.79 4.22 - 5.81 MIL/uL   Hemoglobin 14.3 13.0 - 17.0 g/dL   HCT 42.7 39.0 - 52.0 %   MCV 89.1 78.0 - 100.0 fL   MCH 29.9 26.0 - 34.0 pg   MCHC 33.5 30.0 - 36.0 g/dL   RDW 14.4 11.5 - 15.5 %    Platelets 228 150 - 400 K/uL  Basic metabolic panel     Status: Abnormal   Collection Time: 07/07/16  4:32 AM  Result Value Ref Range   Sodium 132 (L) 135 - 145 mmol/L   Potassium 4.8 3.5 - 5.1 mmol/L   Chloride 93 (L) 101 - 111 mmol/L   CO2 31 22 - 32 mmol/L   Glucose, Bld 248 (H) 65 - 99 mg/dL   BUN 20 6 - 20 mg/dL   Creatinine, Ser 1.18 0.61 - 1.24 mg/dL   Calcium 9.4 8.9 - 10.3 mg/dL   GFR calc non Af Amer 56 (L) >60 mL/min   GFR calc Af Amer >60 >60 mL/min    Comment: (NOTE) The eGFR has been calculated using the CKD EPI equation. This calculation has not been validated in all  clinical situations. eGFR's persistently <60 mL/min signify possible Chronic Kidney Disease.    Anion gap 8 5 - 15  Carboxyhemoglobin     Status: None   Collection Time: 07/07/16  4:52 AM  Result Value Ref Range   Total hemoglobin 15.0 13.5 - 18.0 g/dL   O2 Saturation 56.1 %   Carboxyhemoglobin 1.3 0.5 - 1.5 %   Methemoglobin 0.7 0.0 - 1.5 %  Glucose, capillary     Status: Abnormal   Collection Time: 07/07/16  8:03 AM  Result Value Ref Range   Glucose-Capillary 224 (H) 65 - 99 mg/dL  Glucose, capillary     Status: Abnormal   Collection Time: 07/07/16 11:23 AM  Result Value Ref Range   Glucose-Capillary 232 (H) 65 - 99 mg/dL  Glucose, capillary     Status: Abnormal   Collection Time: 07/07/16 11:29 AM  Result Value Ref Range   Glucose-Capillary 209 (H) 65 - 99 mg/dL    Ct Cardiac Morph/pulm Vein W/cm&w/o Ca Score  Addendum Date: 07/07/2016   ADDENDUM REPORT: 07/07/2016 09:31 EXAM: OVER-READ INTERPRETATION  CT CHEST The following report is an over-read performed by radiologist Dr. Barnetta Hammersmith [facility] on 07/07/2016. This over-read does not include interpretation of cardiac or coronary anatomy or pathology. The coronary CTA/TAVR CTA interpretation by the cardiologist is attached. COMPARISON:  None. FINDINGS: Interstitial edema present with moderate sized right pleural effusion  and tiny left pleural effusion. No incidental pulmonary nodules or enlarged lymph nodes identified. Atherosclerosis of the aortic arch and descending thoracic aorta present without evidence of aneurysmal disease. Proximal great vessels show normal branching anatomy and no significant obstructive disease. Visualized bony structures are unremarkable. Visualized upper abdominal structures are unremarkable. IMPRESSION: Interstitial pulmonary edema with moderate right pleural effusion and small left pleural effusion. Electronically Signed   By: Aletta Edouard M.D.   On: 07/07/2016 09:31   Result Date: 07/07/2016 CLINICAL DATA:  38-yaer-old male with severe aortic stenosis. EXAM: Cardiac TAVR CT TECHNIQUE: The patient was scanned on a Philips 256 scanner. A 120 kV retrospective scan was triggered in the descending thoracic aorta at 111 HU's. Gantry rotation speed was 270 msecs and collimation was .9 mm. 10 mg of iv Metoprolol and no nitro were given. The 3D data set was reconstructed in 5% intervals of the R-R cycle. Systolic and diastolic phases were analyzed on a dedicated work station using MPR, MIP and VRT modes. The patient received 80 cc of contrast. FINDINGS: Aortic Valve: Trileaflet, severely thickened and calcified with severely restricted leaflet opening. There are only mild calcifications extending into the LVOT. Aorta: Normal caliber, moderate diffuse calcifications and severe atherosclerosis on the aortic arch and descending thoracic aorta. No dissection. Sinotubular Junction:  27 x 27 mm Ascending Thoracic Aorta:  32 x 30 mm Aortic Arch:  27 x 27 mm Descending Thoracic Aorta:  28 x 27 mm Sinus of Valsalva Measurements: Non-coronary:  33 mm Right -coronary:  33 mm Left -coronary:  32 mm Coronary Artery Height above Annulus: Left Main:  13 mm Right Coronary:  14 mm Virtual Basal Annulus Measurements: Maximum/Minimum Diameter:  29 x 24 mm Perimeter:  92 mm Area:  574 mm2 Optimum Fluoroscopic Angle for  Delivery:  RAO 6 CRA 4 Other findings: There is a very large left atrial appendage with no evidence of a thrombus. Normal pulmonary vein drainage into the left atrium. Dilated pulmonary artery measuring 32 x 29 mm suggestive of pulmonary hypertension. IMPRESSION: 1. Trileaflet, severely thickened and  calcified with severely restricted leaflet opening and annular measurements suitable for delivery of 29 mm Edward-SAPIEN TAVR valve. 2. Sufficient coronaries to annulus distance. 3. Optimum Fluoroscopic Angle for Delivery:  RAO 6 CRA 4 Ena Dawley Electronically Signed: By: Ena Dawley On: 07/05/2016 19:58    Review of Systems  Constitutional: Positive for malaise/fatigue and weight loss. Negative for chills and fever.  HENT: Negative for hearing loss.        Wears complete dentures  Eyes: Positive for blurred vision.  Respiratory: Positive for cough and shortness of breath. Negative for sputum production.   Cardiovascular: Positive for orthopnea, leg swelling and PND. Negative for chest pain, palpitations and claudication.  Gastrointestinal: Positive for constipation. Negative for abdominal pain, nausea and vomiting.  Genitourinary: Negative.        BPH  Musculoskeletal: Positive for back pain and joint pain.       Primarily lower back and knees. Has caused some difficulty with walking.  Skin: Negative.   Neurological: Positive for dizziness and sensory change.       Temporary blindness left eye  Hx of TIA in past and documented infarct on brain MRI  Endo/Heme/Allergies: Bruises/bleeds easily.  Psychiatric/Behavioral: Negative.        Under a lot of stress caring for his wife.   Blood pressure (!) 121/97, pulse 94, temperature 98.7 F (37.1 C), temperature source Oral, resp. rate (!) 22, height _0  (1.854 m), weight 93.7 kg (206 lb 9.1 oz), SpO2 97 %. Physical Exam  Constitutional: He is oriented to person, place, and time. No distress.  Elderly gentleman in no distress. Slow  mentation  HENT:  Head: Normocephalic and atraumatic.  Mouth/Throat: Oropharynx is clear and moist.  Eyes: EOM are normal. Pupils are equal, round, and reactive to light.  Neck: Normal range of motion. Neck supple. No JVD present.  Cardiovascular: Normal rate.   Irregular rhythm with 3/6 systolic murmur LLSB  Pedal pulses palpable but diminished  Respiratory: Effort normal and breath sounds normal. He has no wheezes. He has no rales.  GI: Soft. Bowel sounds are normal. He exhibits no distension and no mass. There is no tenderness.  Musculoskeletal: Normal range of motion.  Trace ankle edema  Lymphadenopathy:    He has no cervical adenopathy.  Neurological: He is alert and oriented to person, place, and time. He has normal strength. No cranial nerve deficit or sensory deficit.  Skin: Skin is warm and dry.  Chronic discoloration of extremities probably from bruising on chronic warfarin.  Psychiatric: He has a normal mood and affect.              *[facility]*                         [address] [place], [place] [address]                            [phone]  ------------------------------------------------------------------- Transesophageal Echocardiography  Patient:    Lindley, Hiney MR #:       [id number] Study Date: 06/17/2016 Gender:     M Age:        73  Height:     185.4 cm Weight:     98 kg BSA:        2.26 m^2 Pt. Status: Room:   PERFORMING   Candee Furbish, M.D.  ATTENDING    Dunn, Gaston Islam, [place], [place], Jayadeep S  SONOGRAPHER  Jimmy Reel, RDCS  cc:  ------------------------------------------------------------------- LV EF: 30% -   35%  ------------------------------------------------------------------- Indications:      Aortic stenosis 424.1.  Mitral  regurgitation 424.0.  ------------------------------------------------------------------- Study Conclusions  - Left ventricle: Systolic function was moderately to severely   reduced. The estimated ejection fraction was in the range of 30%   to 35%. Diffuse hypokinesis. - Aortic valve: Cusp separation was severely reduced with fusion of   left and non coronary cusps. There was severe stenosis. There was   mild regurgitation. Dimensionless index (VTI) is 0.2 (<0.25 is   severe). Peak and mean velocity and gradients are underestimated   by decreased EF/ CO. Valve area (VTI): 0.7 cm^2. Valve area   (Vmax): 0.78 cm^2. Valve area (Vmean): 0.75 cm^2. Planemtry is   0.84 cm squared. - Mitral valve: There was moderate to severe regurgitation directed   eccentrically and posteriorly. There is pulmonary flow systolic   blunting noted. - Left atrium: The atrium was dilated. No evidence of thrombus in   the atrial cavity or appendage. No evidence of thrombus in the   appendage. There was spontaneous echo contrast (&quot;smoke&quot;). - Right ventricle: Systolic function was mildly reduced. - Atrial septum: No defect or patent foramen ovale was identified. - Superior vena cava: The study excluded a thrombus.  ------------------------------------------------------------------- Study data:   Study status:  Routine.  Consent:  The risks, benefits, and alternatives to the procedure were explained to the patient and informed consent was obtained.  Procedure:  The patient reported no pain pre or post test. Initial setup. The patient was brought to the laboratory. Surface ECG leads were monitored. Sedation. Conscious sedation was administered by cardiology staff. Sedation was reversed at the end of the procedure. Transesophageal echocardiography. Topical anesthesia was obtained using viscous lidocaine. An adult multiplane transesophageal probe was inserted by the attending cardiologistwithout  difficulty. Image quality was adequate.  Study completion:  The patient tolerated the procedure well. There were no complications.  Administered medications: Fentanyl, 41mg, IV.  Midazolam, 460m IV.          Diagnostic transesophageal echocardiography.  2D and color Doppler. Birthdate:  Patient birthdate: 08Oct 29, 1933 Age:  Patient is [age] old.  Sex:  Gender: male.    BMI: 28.5 kg/m^2.  Blood pressure: 141/87  Patient status:  Outpatient.  Study date:  Study date: 06/17/2016. Study time: 08:20 AM.  Location:  Endoscopy.  -------------------------------------------------------------------  ------------------------------------------------------------------- Left ventricle:  Systolic function was moderately to severely reduced. The estimated ejection fraction was in the range of 30% to 35%. Diffuse hypokinesis.  ------------------------------------------------------------------- Aortic valve:   Severely thickened, severely calcified leaflets. Cusp separation was severely reduced with fusion of left and non coronary cusps. Dimensionless index (VTI) is 0.2 (<0.25 is severe). Peak and mean velocity and gradients are underestimated by decreased EF/ CO.  Doppler:   There was severe stenosis.   There was mild regurgitation.    VTI ratio of LVOT to aortic valve: 0.18. Valve area (VTI): 0.7 cm^2. Indexed valve area (VTI): 0.31 cm^2/m^2. Peak velocity ratio of LVOT to aortic  valve: 0.21. Valve area (Vmax): 0.78 cm^2. Indexed valve area (Vmax): 0.34 cm^2/m^2. Mean velocity ratio of LVOT to aortic valve: 0.2. Valve area (Vmean): 0.75 cm^2. Indexed valve area (Vmean): 0.33 cm^2/m^2. Mean gradient (S): 22 mm Hg. Peak gradient (S): 41 mm Hg.  ------------------------------------------------------------------- Aorta:  The aorta was not dilated and moderately calcified.  ------------------------------------------------------------------- Mitral valve:   Moderately thickened leaflets .   Doppler:  There was moderate to severe regurgitation directed eccentrically and posteriorly. PISA is inaccurate given multiple confounding factors. There is pulmonary flow systolic blunting noted. Regurgitant color Doppler signal encompasses 40% of LA size.  ------------------------------------------------------------------- Left atrium:  The atrium was dilated.  No evidence of thrombus in the atrial cavity or appendage.  No evidence of thrombus in the appendage. There was spontaneous echo contrast (&quot;smoke&quot;). The appendage was of normal size. Emptying velocity was normal.  ------------------------------------------------------------------- Atrial septum:  No defect or patent foramen ovale was identified.   ------------------------------------------------------------------- Right ventricle:  The cavity size was normal. Systolic function was mildly reduced.  ------------------------------------------------------------------- Pulmonic valve:    Structurally normal valve.   Cusp separation was normal.  Doppler:  There was mild regurgitation.  ------------------------------------------------------------------- Tricuspid valve:   Structurally normal valve.   Leaflet separation was normal.  No evidence of vegetation.  Doppler:  There was no regurgitation.  ------------------------------------------------------------------- Pulmonary artery:   The main pulmonary artery was normal-sized.  ------------------------------------------------------------------- Right atrium:  The atrium was normal in size.  No evidence of thrombus in the atrial cavity or appendage.  ------------------------------------------------------------------- Pericardium:  The pericardium was normal in appearance. There was no pericardial effusion.  ------------------------------------------------------------------- Systemic veins: Superior vena cava: The study excluded a  thrombus.  ------------------------------------------------------------------- Post procedure conclusions Ascending Aorta:  - The aorta was not dilated and moderately calcified.  ------------------------------------------------------------------- Measurements   Left ventricle                           Value  Stroke volume, 2D                        51    ml  Stroke volume/bsa, 2D                    23    ml/m^2    LVOT                                     Value  LVOT ID, S                               22    mm  LVOT area                                3.8   cm^2  LVOT peak velocity, S                    65.7  cm/s  LVOT mean velocity, S                    41.5  cm/s  LVOT VTI, S  13.5  cm    Aortic valve                             Value  Aortic valve peak velocity, S            319   cm/s  Aortic valve mean velocity, S            211   cm/s  Aortic valve VTI, S                      73.6  cm  Aortic mean gradient, S                  22    mm Hg  Aortic peak gradient, S                  41    mm Hg  VTI ratio, LVOT/AV                       0.18  Aortic valve area, VTI                   0.7   cm^2  Aortic valve area/bsa, VTI               0.31  cm^2/m^2  Velocity ratio, peak, LVOT/AV            0.21  Aortic valve area, peak velocity         0.78  cm^2  Aortic valve area/bsa, peak velocity     0.34  cm^2/m^2  Velocity ratio, mean, LVOT/AV            0.2  Aortic valve area, mean velocity         0.75  cm^2  Aortic valve area/bsa, mean velocity     0.33  cm^2/m^2    Mitral valve                             Value  Mitral regurg VTI, PISA                  191   cm  Mitral ERO, PISA                         0.2   cm^2  Mitral regurg volume, PISA               38    ml  Legend: (L)  and  (H)  mark values outside specified reference range.  ------------------------------------------------------------------- Prepared and Electronically  Authenticated by  Candee Furbish, M.D. 2017-08-04T10:16:21  Jettie Booze, MD (Primary)    Procedures   Right Heart Cath and Coronary Angiography  Conclusion     Ost Cx to Prox Cx lesion, 50 %stenosed.  Mid Cx to Dist Cx lesion, 80 %stenosed.  Mid RCA lesion, 40 %stenosed.  Mid LAD lesion, 75 %stenosed.  2nd Diag lesion, 75 %stenosed.  Hemodynamic findings consistent with moderate pulmonary hypertension.  Unable to cross the aortic valve.  PA sat 52%. CO 3.2 L/min.   Continue with plans for CT surgery evaluation, for valve disease and two vessel CAD.   Indications   Aortic stenosis [I35.0 (ICD-10-CM)]  Procedural Details/Technique   Technical Details The risks, benefits, and details of the procedure were explained  to the patient. The patient verbalized understanding and wanted to proceed. Informed written consent was obtained.  PROCEDURE TECHNIQUE: After Xylocaine anesthesia, a 7 French sheath was placed in the right common femoral vein. A 7 French balloontipped Swan-Ganz catheter was advanced to the pulmonary artery under fluoroscopic guidance. Hemodynamic pressures were obtained. Oxygen saturations were obtained. After Xylocaine anesthesia, a 21F sheath was placed in the right radial artery with a single anterior needle wall stick. Left coronary angiography was done using a Judkins L3.5 guide catheter. Right coronary angiography was done using a Judkins R4 guide catheter. Left heart cath was not done due to inability to cross the aortic valve despite use of a straight wire.     Contrast: 50 cc  The patient had been sedated for TEE earlier so additional sedation was not given.   Estimated blood loss <50 mL. .    Complications   Complications documented before study signed (06/17/2016 11:19 AM EDT)    No complications were associated with this study.  Documented by Jettie Booze, MD - 06/17/2016 11:18 AM EDT    Coronary Findings   Dominance: Right   Left Anterior Descending  Mid LAD lesion, 75% stenosed. The lesion is type C and located at the major branch.  Second Diagonal Branch  Vessel is small in size.  2nd Diag lesion, 75% stenosed.  Third Diagonal Branch  Vessel is small in size.  Ramus Intermedius  Vessel is small.  Left Circumflex  Ost Cx to Prox Cx lesion, 50% stenosed.  Mid Cx to Dist Cx lesion, 80% stenosed.  Right Coronary Artery  Vessel is large. There is mild the vessel.  Mid RCA lesion, 40% stenosed. The lesion is irregular.  Right Heart   Right Heart Pressures Hemodynamic findings consistent with moderate pulmonary hypertension.    Coronary Diagrams   Diagnostic Diagram     Implants     No implant documentation for this case.  PACS Images   Show images for Cardiac catheterization   Link to Procedure Log   Procedure Log    Hemo Data   Flowsheet Row Most Recent Value  Fick Cardiac Output 3.25 L/min  Fick Cardiac Output Index 1.46 (L/min)/BSA  RA A Wave -99 mmHg  RA V Wave 13 mmHg  RA Mean 11 mmHg  RV Systolic Pressure 48 mmHg  RV Diastolic Pressure 3 mmHg  RV EDP 9 mmHg  PA Systolic Pressure 52 mmHg  PA Diastolic Pressure 23 mmHg  PA Mean 37 mmHg  PW A Wave -99 mmHg  PW V Wave 41 mmHg  PW Mean 25 mmHg  AO Systolic Pressure 431 mmHg  AO Diastolic Pressure 60 mmHg  AO Mean 87 mmHg  QP/QS 1  TPVR Index 25.3 HRUI  TSVR Index 59.5 HRUI  PVR SVR Ratio 0.16  TPVR/TSVR Ratio 0.43      ADDENDUM REPORT: 07/07/2016 09:31  EXAM: OVER-READ INTERPRETATION  CT CHEST  The following report is an over-read performed by radiologist Dr. Barnetta Hammersmith [facility] on 07/07/2016. This over-read does not include interpretation of cardiac or coronary anatomy or pathology. The coronary CTA/TAVR CTA interpretation by the cardiologist is attached.  COMPARISON:  None.  FINDINGS: Interstitial edema present with moderate sized right pleural effusion and tiny left pleural  effusion. No incidental pulmonary nodules or enlarged lymph nodes identified. Atherosclerosis of the aortic arch and descending thoracic aorta present without evidence of aneurysmal disease. Proximal great vessels show normal branching anatomy and no significant obstructive disease. Visualized  bony structures are unremarkable. Visualized upper abdominal structures are unremarkable.  IMPRESSION: Interstitial pulmonary edema with moderate right pleural effusion and small left pleural effusion.   Electronically Signed   By: Aletta Edouard M.D.   On: 07/07/2016 09:31   Addended by Aletta Edouard, MD on 07/07/2016 9:33 AM    Study Result   CLINICAL DATA:  32-yaer-old male with severe aortic stenosis.  EXAM: Cardiac TAVR CT  TECHNIQUE: The patient was scanned on a Philips 256 scanner. A 120 kV retrospective scan was triggered in the descending thoracic aorta at 111 HU's. Gantry rotation speed was 270 msecs and collimation was .9 mm. 10 mg of iv Metoprolol and no nitro were given. The 3D data set was reconstructed in 5% intervals of the R-R cycle. Systolic and diastolic phases were analyzed on a dedicated work station using MPR, MIP and VRT modes. The patient received 80 cc of contrast.  FINDINGS: Aortic Valve: Trileaflet, severely thickened and calcified with severely restricted leaflet opening. There are only mild calcifications extending into the LVOT.  Aorta: Normal caliber, moderate diffuse calcifications and severe atherosclerosis on the aortic arch and descending thoracic aorta. No dissection.  Sinotubular Junction:  27 x 27 mm  Ascending Thoracic Aorta:  32 x 30 mm  Aortic Arch:  27 x 27 mm  Descending Thoracic Aorta:  28 x 27 mm  Sinus of Valsalva Measurements:  Non-coronary:  33 mm  Right -coronary:  33 mm  Left -coronary:  32 mm  Coronary Artery Height above Annulus:  Left Main:  13 mm  Right Coronary:  14 mm  Virtual Basal  Annulus Measurements:  Maximum/Minimum Diameter:  29 x 24 mm  Perimeter:  92 mm  Area:  574 mm2  Optimum Fluoroscopic Angle for Delivery:  RAO 6 CRA 4  Other findings:  There is a very large left atrial appendage with no evidence of a thrombus.  Normal pulmonary vein drainage into the left atrium.  Dilated pulmonary artery measuring 32 x 29 mm suggestive of pulmonary hypertension.  IMPRESSION: 1. Trileaflet, severely thickened and calcified with severely restricted leaflet opening and annular measurements suitable for delivery of 29 mm Edward-SAPIEN TAVR valve.  2. Sufficient coronaries to annulus distance.  3. Optimum Fluoroscopic Angle for Delivery:  RAO 6 CRA 4  Ena Dawley  Electronically Signed: By: Ena Dawley On: 07/05/2016 19:58      Assessment/Plan:  This 82 year old gentleman has severe low gradient, low EF aortic stenosis with moderate to severe mitral regurgitation, moderate multi-vessel coronary artery disease, chronic persistent atrial fibrillation presenting with acute on chronic systolic and diastolic heart failure, NYHA class IV, with early cardiogenic shock. I have personally reviewed his recent echos and cath. The aortic valve is trileaflet with severe calcification and thickening of the leaflets and poor separation. This clearly looks like a severely narrowed valve and although the gradient is only 22 mm Hg the DI is 0.16. There is moderate to severe MR but the leaflets are fairly thin and its likely that the MR is functional and may improve with relief of the AS. He has moderate coronary disease but no anginal symptoms. I don't think he would be a good open surgical candidate due to elevated risk related to his numerous comorbid conditions including hypertension, chronic persistent atrial fibrillation, type II diabetes mellitus, cerebrovascular disease, low EF and advanced age. In addition his risk would be increased significantly by  the presence of moderate to severe MR that would require concomitant  repair or replacement. I think TAVR would be a good alternative for him. His cardiac CT shows that he has anatomy suitable for a Sapien 3 valve. His abdominal and pelvic CT shows adequate pelvic access for a transfemoral approach. He is scheduled for right carotid stenting on Monday and I agree that we should wait for at least a few days after that before performing TAVR.  Fernande Boyden Bartle 07/07/2016, 4:02 PM

## 2016-07-07 NOTE — Progress Notes (Signed)
ANTICOAGULATION CONSULT NOTE - Follow Up Consult  Pharmacy Consult for Heparin Indication: atrial fibrillation  Allergies  Allergen Reactions  . Ambien [Zolpidem Tartrate] Other (See Comments)    Sleep walks  . Cholestatin   . Lipitor [Atorvastatin Calcium] Other (See Comments)    myalgias  . Ranitidine Other (See Comments)    Chest discomfort  . Simvastatin Other (See Comments)    Myalgias  . Xanax Xr [Alprazolam Er]     Tightness in chest    Patient Measurements: Height: 6\' 1"  (185.4 cm) Weight: 206 lb 9.1 oz (93.7 kg) IBW/kg (Calculated) : 79.9  Vital Signs: Temp: 98.7 F (37.1 C) (08/24 1131) Temp Source: Oral (08/24 1131) BP: 121/97 (08/24 1200) Pulse Rate: 44 (08/24 1200)  Labs:  Recent Labs  07/05/16 0345 07/06/16 0415 07/07/16 0428 07/07/16 0432  HGB 13.6 14.2  --  14.3  HCT 41.3 41.8  --  42.7  PLT 220 236  --  228  HEPARINUNFRC 0.40 0.52 0.58  --   CREATININE 1.21 1.22  --  1.18    Estimated Creatinine Clearance: 54.5 mL/min (by C-G formula based on SCr of 1.18 mg/dL).  Medications: Heparin @ 1400 units/hr  Assessment: 83yom continues on heparin for afib (while coumadin on hold) in the setting of cardiogenic shock.   Heparin level remains therapeutic at 0.5. CBC stable. No bleeding reported. Noted plan for carotid stent and then TAVR next week.  Rash on back and arm: most likely from plavix that was started on 8/22. Other possible causes are digoxin which was started on 8/18. No antibiotics this admission. His allergies with statins have been myopathy. - Discussed with HF-NP will start prn diphenhydramine and try and short course of steroids to try and keep him on his plavix  Asim et al. Jacc 2011 - outlines management with diphenhydramine and steroids. -3 weeks of tapering steroids 30mg  bid decreasing by 5mg /day every 3 days for 15 days, diphenhydramine prn  Goal of Therapy:  Heparin level 0.3-0.7 units/ml Monitor platelets by anticoagulation  protocol: Yes   Plan:  1) Continue heparin at 1400 units/hr 2) Daily heparin level, CBC 3) Follow up timing of procedures  Erin Hearing PharmD., BCPS Clinical Pharmacist Pager [phone] 07/07/2016 2:25 PM

## 2016-07-07 NOTE — Progress Notes (Signed)
Physical Therapy Treatment Patient Details Name: [name] [name] MRN: [id number] DOB: May 24, 1932 Today's Date: 07/07/2016    History of Present Illness Patient is an 82 yo male admitted 06/28/16 with marked DOE.  Patient with cardiogenic shock, acute respiratory failure, CAD-2 vessel disease, valvular disease - severe AS/severe MR, carotid stenosis - RICA stenosis 80-90, Afib with RVR.      PMH:  CAD, HTN, HLD, DM, systolic CHF w reduced EF 35-40%, carotid stenosis and aortic valve stenosis, TIA/CVA, Afib, CKD, DJD, PVD    PT Comments    Patient progressing and able to walk again after just finished with cardiac rehab.  Will continue to follow acutely and recommend HHPT at d/c.   Follow Up Recommendations  Home health PT;Supervision for mobility/OOB     Equipment Recommendations  None recommended by PT    Recommendations for Other Services       Precautions / Restrictions Precautions Precautions: Fall    Mobility  Bed Mobility Overal bed mobility: Needs Assistance Bed Mobility: Sit to Supine       Sit to supine: Min assist   General bed mobility comments: for positioning and lines  Transfers Overall transfer level: Needs assistance Equipment used: Rolling walker (2 wheeled) Transfers: Sit to/from Stand Sit to Stand: Min assist;+2 physical assistance         General transfer comment: assist for lines and for balance  Ambulation/Gait Ambulation/Gait assistance: Min assist;+2 safety/equipment Ambulation Distance (Feet): 150 Feet Assistive device: Rolling walker (2 wheeled) Gait Pattern/deviations: Step-through pattern;Decreased stride length;Trunk flexed     General Gait Details: cues for posture and for walker safety, assist for balance and step length; chair following and limited gait due to pt needing to toilet   Stairs            Wheelchair Mobility    Modified Rankin (Stroke Patients Only)       Balance Overall balance assessment: Needs  assistance         Standing balance support: During functional activity;Bilateral upper extremity supported Standing balance-Leahy Scale: Poor Standing balance comment: holds walker while PT assist with hygiene                    Cognition Arousal/Alertness: Awake/alert Behavior During Therapy: WFL for tasks assessed/performed Overall Cognitive Status: Within Functional Limits for tasks assessed                      Exercises      General Comments        Pertinent Vitals/Pain Pain Assessment: No/denies pain    Home Living                      Prior Function            PT Goals (current goals can now be found in the care plan section) Progress towards PT goals: Progressing toward goals    Frequency  Min 3X/week    PT Plan Current plan remains appropriate    Co-evaluation             End of Session Equipment Utilized During Treatment: Gait belt;Oxygen Activity Tolerance: Patient limited by fatigue Patient left: in bed;with call bell/phone within reach     Time: 1420-1445 PT Time Calculation (min) (ACUTE ONLY): 25 min  Charges:  $Gait Training: 8-22 mins $Therapeutic Activity: 8-22 mins  G CodesReginia Naas 07/07/2016, 4:58 PM  Magda Kiel, Lane 07/07/2016

## 2016-07-07 NOTE — Progress Notes (Signed)
  Echocardiogram 2D Echocardiogram with definity has been performed.  Darlina Sicilian M 07/07/2016, 4:09 PM

## 2016-07-08 ENCOUNTER — Inpatient Hospital Stay (HOSPITAL_COMMUNITY): Payer: Medicare Other

## 2016-07-08 DIAGNOSIS — I6529 Occlusion and stenosis of unspecified carotid artery: Secondary | ICD-10-CM

## 2016-07-08 DIAGNOSIS — I350 Nonrheumatic aortic (valve) stenosis: Secondary | ICD-10-CM

## 2016-07-08 DIAGNOSIS — I6523 Occlusion and stenosis of bilateral carotid arteries: Secondary | ICD-10-CM

## 2016-07-08 DIAGNOSIS — I2510 Atherosclerotic heart disease of native coronary artery without angina pectoris: Secondary | ICD-10-CM

## 2016-07-08 LAB — GLUCOSE, CAPILLARY
GLUCOSE-CAPILLARY: 210 mg/dL — AB (ref 65–99)
GLUCOSE-CAPILLARY: 272 mg/dL — AB (ref 65–99)
Glucose-Capillary: 158 mg/dL — ABNORMAL HIGH (ref 65–99)
Glucose-Capillary: 223 mg/dL — ABNORMAL HIGH (ref 65–99)

## 2016-07-08 LAB — CBC
HEMATOCRIT: 43.9 % (ref 39.0–52.0)
HEMOGLOBIN: 14.8 g/dL (ref 13.0–17.0)
MCH: 29.9 pg (ref 26.0–34.0)
MCHC: 33.7 g/dL (ref 30.0–36.0)
MCV: 88.7 fL (ref 78.0–100.0)
Platelets: 252 10*3/uL (ref 150–400)
RBC: 4.95 MIL/uL (ref 4.22–5.81)
RDW: 14.3 % (ref 11.5–15.5)
WBC: 9.8 10*3/uL (ref 4.0–10.5)

## 2016-07-08 LAB — SPIROMETRY WITH GRAPH
FEF 25-75 POST: 1.46 L/s
FEF 25-75 PRE: 1.71 L/s
FEF2575-%Change-Post: -14 %
FEF2575-%Pred-Post: 67 %
FEF2575-%Pred-Pre: 79 %
FEV1-%CHANGE-POST: -7 %
FEV1-%PRED-PRE: 68 %
FEV1-%Pred-Post: 62 %
FEV1-POST: 2 L
FEV1-Pre: 2.17 L
FEV1FVC-%Change-Post: -1 %
FEV1FVC-%Pred-Pre: 106 %
FEV6-%CHANGE-POST: -6 %
FEV6-%PRED-POST: 64 %
FEV6-%Pred-Pre: 69 %
FEV6-PRE: 2.88 L
FEV6-Post: 2.68 L
FEV6FVC-%PRED-POST: 107 %
FEV6FVC-%Pred-Pre: 107 %
FVC-%Change-Post: -6 %
FVC-%PRED-POST: 60 %
FVC-%PRED-PRE: 64 %
FVC-PRE: 2.88 L
FVC-Post: 2.68 L
POST FEV6/FVC RATIO: 100 %
PRE FEV1/FVC RATIO: 75 %
Post FEV1/FVC ratio: 75 %
Pre FEV6/FVC Ratio: 100 %

## 2016-07-08 LAB — CARBOXYHEMOGLOBIN
CARBOXYHEMOGLOBIN: 1.4 % (ref 0.5–1.5)
METHEMOGLOBIN: 0.7 % (ref 0.0–1.5)
O2 Saturation: 52.4 %
Total hemoglobin: 15.1 g/dL (ref 13.5–18.0)

## 2016-07-08 LAB — BASIC METABOLIC PANEL
Anion gap: 9 (ref 5–15)
BUN: 18 mg/dL (ref 6–20)
CALCIUM: 9.8 mg/dL (ref 8.9–10.3)
CHLORIDE: 93 mmol/L — AB (ref 101–111)
CO2: 28 mmol/L (ref 22–32)
CREATININE: 1.35 mg/dL — AB (ref 0.61–1.24)
GFR, EST AFRICAN AMERICAN: 55 mL/min — AB (ref >60)
GFR, EST NON AFRICAN AMERICAN: 47 mL/min — AB (ref >60)
Glucose, Bld: 263 mg/dL — ABNORMAL HIGH (ref 65–99)
Potassium: 5.3 mmol/L — ABNORMAL HIGH (ref 3.5–5.1)
SODIUM: 130 mmol/L — AB (ref 135–145)

## 2016-07-08 LAB — HEPARIN LEVEL (UNFRACTIONATED): HEPARIN UNFRACTIONATED: 0.7 [IU]/mL (ref 0.30–0.70)

## 2016-07-08 MED ORDER — ALBUTEROL SULFATE (2.5 MG/3ML) 0.083% IN NEBU
2.5000 mg | INHALATION_SOLUTION | Freq: Once | RESPIRATORY_TRACT | Status: AC
  Administered 2016-07-08: 2.5 mg via RESPIRATORY_TRACT

## 2016-07-08 MED ORDER — SORBITOL 70 % SOLN
30.0000 mL | Freq: Once | Status: AC
  Administered 2016-07-08: 30 mL via ORAL
  Filled 2016-07-08: qty 30

## 2016-07-08 MED ORDER — CHLORHEXIDINE GLUCONATE 0.12 % MT SOLN
15.0000 mL | Freq: Two times a day (BID) | OROMUCOSAL | Status: DC
  Administered 2016-07-08 – 2016-07-10 (×5): 15 mL via OROMUCOSAL
  Filled 2016-07-08 (×4): qty 15

## 2016-07-08 MED ORDER — AMIODARONE HCL 200 MG PO TABS
200.0000 mg | ORAL_TABLET | Freq: Two times a day (BID) | ORAL | Status: DC
  Administered 2016-07-08 – 2016-07-14 (×14): 200 mg via ORAL
  Filled 2016-07-08 (×14): qty 1

## 2016-07-08 MED ORDER — DOCUSATE SODIUM 100 MG PO CAPS
100.0000 mg | ORAL_CAPSULE | Freq: Two times a day (BID) | ORAL | Status: DC
  Administered 2016-07-08 – 2016-07-18 (×21): 100 mg via ORAL
  Filled 2016-07-08 (×21): qty 1

## 2016-07-08 MED ORDER — ORAL CARE MOUTH RINSE
15.0000 mL | Freq: Two times a day (BID) | OROMUCOSAL | Status: DC
  Administered 2016-07-09 – 2016-07-10 (×4): 15 mL via OROMUCOSAL

## 2016-07-08 MED ORDER — TICAGRELOR 90 MG PO TABS
90.0000 mg | ORAL_TABLET | Freq: Two times a day (BID) | ORAL | Status: DC
  Administered 2016-07-08 – 2016-07-10 (×5): 90 mg via ORAL
  Filled 2016-07-08 (×5): qty 1

## 2016-07-08 NOTE — Consult Note (Addendum)
CARDIOLOGY CONSULT NOTE  Patient ID: [name] [name], MRN: [id number], DOB/AGE: [birth year] 82 y.o. Admit date: 06/28/2016 Date of Consult: 07/08/2016  Primary Physician: Sallee Lange, MD Primary Cardiologist: Dr Harl Bowie Referring Physician: Dr Haroldine Laws  Chief Complaint: Shortness of breath  Reason for Consultation: Aortic stenosis  HPI: 82 yo male with Aortic stenosis, chronic atrial fibrillation, diabetes, hypertension, and extracranial cerebrovascular disease who is hospitalized with acute on chronic systolic heart failure and cardiogenic shock. He has been followed in the past for moderate aortic stenosis. The patient has had a gradual decline in his exercise capacity over the last several years with more progressive symptoms over the past 6 months.  The patient is interviewed with his niece at the bedside. He has no complaints today at rest. He just walked 500 feet with cardiac rehab with the assistance of a walker.   He has been very weak over the last 3 months. He has done very little walking and hasn't been out of the house other than to physician appointments. He has been generally weak and has shortness of breath with minimal activity. The patient's niece states that he is much better over the course of the past week on IV milrinone. He has had no chest pain or frank syncope.   The patient has been diagnosed with critical carotid artery stenosis and he is scheduled for carotid stenting on Monday. Plans have been tentatively made to perform TAVR after he undergoes carotid stenting. He was started on plavix and developed an allergic skin reaction, so this has been discontinued.    Medical History:  Past Medical History:  Diagnosis Date  . Aortic stenosis    a. mod-sev by echo 05/2016.  . Asthma   . Cerebrovascular disease 07/2010   TIA; carotid ultrasound in 07/2010-significant bilateral plaque without focal internal carotid artery stenosis; MRI -encephalomalacia left temporal and  right temporal lobes; small inferior right cerebellar infarct; small vessel disease  . Chronic atrial fibrillation (HCC)    Paroxysmal; Echocardiogram in 2007-normal EF; mild LVH; left atrial enlargement; mild stenosis and minimal AI; negative stress nuclear study in 2008  . Chronic systolic CHF (congestive heart failure) ([place])    a. dx 05/2016 - EF 35-40%, diffuse HK, mod-severe AS, mod gradient, severe AVA VTI likely due to decreased cardiac output in setting of systolic dysfsunction and significant mitral regurgitation, mild MR, mod-severe MR, severe LAE, mild-mod RV dilation, mild RAE, mild-mod TR, mod PASP 85mmHg.  . CKD (chronic kidney disease), stage II   . Degenerative joint disease    feet and legs  . Diabetes mellitus    no insulin; A1c of 6.6 in 2005  . Dizziness    occurs daily,especially in am  . Exertional dyspnea   . Gastroesophageal reflux disease   . Hepatic steatosis   . History of noncompliance with medical treatment   . Hyperlipidemia    adverse reactions to statins and niacin  . Hypertension    Borderline  . Irregular heartbeat   . Mitral regurgitation    a. mod-sev by echo 05/2016  . Peripheral vascular disease ([place])   . Renal insufficiency   . Temporal arteritis ([place])   . Tricuspid regurgitation    a. mild-mod TR by echo 05/2016      Surgical History:  Past Surgical History:  Procedure Laterality Date  . COLONOSCOPY  2002  . COLONOSCOPY  01/19/2012   Procedure: COLONOSCOPY;  Surgeon: Rogene Houston, MD;  Location: [facility];  Service:  Endoscopy;  Laterality: N/A;  1030  . LIPOMA EXCISION  1980  . ORIF ANKLE FRACTURE  2000   Right  . PROSTATE SURGERY  12/2011  . ROTATOR CUFF REPAIR     Right  . TEE WITHOUT CARDIOVERSION N/A 06/17/2016   Procedure: TRANSESOPHAGEAL ECHOCARDIOGRAM (TEE);  Surgeon: Jerline Pain, MD;  Location: [place];  Service: Cardiovascular;  Laterality: N/A;  . TRANSURETHRAL RESECTION OF PROSTATE  09/2011  . URETHRAL STRICTURE  DILATATION  1980s     Home Meds: Prior to Admission medications   Medication Sig Start Date End Date Taking? Authorizing Provider  albuterol (PROVENTIL HFA;VENTOLIN HFA) 108 (90 Base) MCG/ACT inhaler Inhale 1-2 puffs into the lungs 5 (five) times daily as needed for wheezing or shortness of breath. 05/28/16  Yes Nat Christen, MD  furosemide (LASIX) 20 MG tablet Take 2 tablets (40 mg total) by mouth daily. 06/22/16 09/20/16 Yes Arnoldo Lenis, MD  HYDROcodone-acetaminophen (NORCO/VICODIN) 5-325 MG tablet Take 1 tablet by mouth 3 (three) times daily as needed for moderate pain. 05/05/16  Yes Kathyrn Drown, MD  metFORMIN (GLUCOPHAGE) 500 MG tablet 1/2 tablet twice a day 06/19/16  Yes Jettie Booze, MD  metoprolol succinate (TOPROL-XL) 25 MG 24 hr tablet Take 0.5 tablets (12.5 mg total) by mouth daily. 06/22/16  Yes Arnoldo Lenis, MD  nortriptyline (PAMELOR) 10 MG capsule TAKE 1 TO 2 CAPSULES AT BEDTIME FOR BURNING IN FEET. 03/29/16  Yes Kathyrn Drown, MD  traZODone (DESYREL) 100 MG tablet Take 1 tablet (100 mg total) by mouth at bedtime. 02/05/16  Yes Nilda Simmer, NP  warfarin (COUMADIN) 5 MG tablet Take 1 tablet (5 mg total) by mouth daily. TAKE AS DIRECTED BY PHYSICIAN Patient taking differently: Take 2.5-5 mg by mouth See admin instructions. TAKE 5mg  daily except take 2.5mg  (1/2 tab) on Tuesday, Thursday and Saturday. 02/05/16  Yes Nilda Simmer, NP    Inpatient Medications:  . amiodarone  200 mg Oral BID  . antiseptic oral rinse  7 mL Mouth Rinse BID  . aspirin  81 mg Oral Daily  . digoxin  0.125 mg Oral Daily  . furosemide  40 mg Oral BID  . insulin aspart  0-9 Units Subcutaneous TID WC  . rosuvastatin  5 mg Oral q1800  . sodium chloride flush  10-40 mL Intracatheter Q12H  . sodium chloride flush  3 mL Intravenous Q12H  . ticagrelor  90 mg Oral BID  . traZODone  100 mg Oral QHS   . heparin 1,300 Units/hr (07/08/16 0901)  . milrinone 0.25 mcg/kg/min (07/08/16 0600)     Allergies:  Allergies  Allergen Reactions  . Ambien [Zolpidem Tartrate] Other (See Comments)    Sleep walks  . Cholestatin   . Lipitor [Atorvastatin Calcium] Other (See Comments)    myalgias  . Ranitidine Other (See Comments)    Chest discomfort  . Simvastatin Other (See Comments)    Myalgias  . Xanax Xr [Alprazolam Er]     Tightness in chest    Social History   Social History  . Marital status: Married    Spouse name: N/A  . Number of children: 1  . Years of education: N/A   Occupational History  . Retired    Social History Main Topics  . Smoking status: Former Smoker    Packs/day: 1.00    Years: 20.00    Types: Cigarettes    Start date: 07/04/1950    Quit date: 04/25/1992  . Smokeless  tobacco: Current User    Types: Chew  . Alcohol use No  . Drug use: No  . Sexual activity: Not Currently   Other Topics Concern  . Not on file   Social History Narrative  . No narrative on file     Family History  Problem Relation Age of Onset  . Stroke Mother   . Diabetes Father   . Colon cancer Neg Hx      Review of Systems:  General: negative for chills, fever, night sweats or weight changes.  ENT: positive for epistaxis Cardiovascular: positive for edema, orthopnea, and PND, now all improved/resolved Dermatological: negative for rash Respiratory: negative for cough or wheezing, positive for shortness of breath GI: negative for nausea, vomiting, diarrhea, bright red blood per rectum, melena, or hematemesis GU: no hematuria, urgency, or frequency Neurologic: positive for dizziness Heme: no easy bruising or bleeding Endo: negative for excessive thirst, thyroid disorder, or flushing Musculoskeletal: negative for joint pain or swelling, negative for myalgias  All other systems reviewed and are otherwise negative except as noted above.  Physical Exam: Blood pressure 128/69, pulse 91, temperature 97.5 F (36.4 C), temperature source Oral, resp. rate 12, height 6'  1" (1.854 m), weight 93.7 kg (206 lb 9.1 oz), SpO2 91 %. Pt is alert and oriented, pleasant elderly male, in no distress. HEENT: normal Neck: JVP normal. Carotid upstrokes normal with bilateral bruits. No thyromegaly. Lungs: equal expansion, clear bilaterally CV: Apex is discrete and nondisplaced, irregularly irregular with 3/6 harsh systolic murmur at the LLSB Abd: soft, NT, +BS, no bruit, no hepatosplenomegaly Back: no CVA tenderness Ext: no C/C/E Skin: warm and dry, rash on back Neuro: CNII-XII intact             Strength intact = bilaterally   Labs: No results for input(s): CKTOTAL, CKMB, TROPONINI in the last 72 hours. Lab Results  Component Value Date   WBC 9.8 07/08/2016   HGB 14.8 07/08/2016   HCT 43.9 07/08/2016   MCV 88.7 07/08/2016   PLT 252 07/08/2016    Recent Labs Lab 07/08/16 0410  NA 130*  K 5.3*  CL 93*  CO2 28  BUN 18  CREATININE 1.35*  CALCIUM 9.8  GLUCOSE 263*   Lab Results  Component Value Date   CHOL 225 (H) 04/13/2016   HDL 34 (L) 04/13/2016   LDLCALC 165 (H) 04/13/2016   TRIG 128 04/13/2016   Lab Results  Component Value Date   DDIMER <0.27 06/28/2016    Radiology/Studies:  Ct Angio Head W Or Wo Contrast  Result Date: 07/02/2016 CLINICAL DATA:  Carotid stenosis EXAM: CT ANGIOGRAPHY HEAD AND NECK TECHNIQUE: Multidetector CT imaging of the head and neck was performed using the standard protocol during bolus administration of intravenous contrast. Multiplanar CT image reconstructions and MIPs were obtained to evaluate the vascular anatomy. Carotid stenosis measurements (when applicable) are obtained utilizing NASCET criteria, using the distal internal carotid diameter as the denominator. CONTRAST:  50 mL Isovue 370 COMPARISON:  MRI brain 04/03/2012, head CT 10/01/2015. FINDINGS: CT HEAD Brain: No mass lesion, intraparenchymal hemorrhage or extra-axial collection. No evidence of acute infarct. No midline shift or hydrocephalus. There is confluent  periventricular hypoattenuation compatible with chronic microvascular disease. Left temporal encephalomalacia is unchanged. Old right cerebellar infarct. Ventricles and sulci are normal for patient age. Calvarium and skull base: The skull is normal. Visualized extracranial soft tissues are normal. Paranasal sinuses: Normal Orbits: Normal CTA HEAD Intracranial internal carotid arteries: Mild atherosclerotic calcification at  the cavernous and clinoid segments. Anterior cerebral arteries: Normal Middle cerebral arteries: Normal. Posterior communicating arteries: Present on the right. Absent on the left. Posterior cerebral arteries: Normal. Basilar artery: Normal. Vertebral arteries: Right-dominant. There is loss of the normal opacification of the left vertebral artery V4 segment distal to the origin of the PICA. Superior cerebellar arteries: Normal. Anterior inferior cerebellar arteries: Not clearly visualized. Posterior inferior cerebellar arteries: Normal. Venous sinuses: Normal Anatomic variants: None Delayed phase: Unremarkable CTA NECK Aortic arch: There is mild atherosclerotic calcification within the aortic arch and proximal great vessels. Normal 3 vessel configuration. Moderate narrowing of the proximal left common carotid artery secondary to atherosclerotic plaque. Both subclavian arteries are patent. Right carotid system: There is atherosclerotic plaque at the right carotid bifurcation resulting in near complete common greater than 90% stenosis. This stenosis covers only a short segment of the artery in the remainder of the right internal carotid artery is normal. Left carotid system: As above, moderate atherosclerotic narrowing of the proximal left common carotid artery. There is atherosclerotic plaquing calcification at the left carotid bifurcation resulting in stenosis of approximately 70%. The remainder of the course of the left internal carotid artery is normal. Vertebral arteries:There is  atherosclerotic plaque at the origin of the right vertebral artery, but the origin remains clearly patent. The course and caliber of the right vertebral artery are normal without stenosis. There is atherosclerotic calcification of the origin of the left vertebral artery without significant stenosis. The left vertebral artery is diminutive along its entire course. Skeleton: Multilevel facet arthrosis and osteophytosis. No lytic or blastic osseous lesions. Other neck: Numerous scattered subcentimeter mediastinal lymph nodes. Parotid and submandibular glands are normal. The larynx is normal. There is fullness within the left fossa of Rosenmuller. Unremarkable thyroid Upper chest: There are large bilateral pleural effusions and biapical emphysema. No nodules or masses. IMPRESSION: 1. Critical stenosis of the proximal right internal carotid artery, secondary to the presence of atherosclerotic plaque. 2. Moderate to severe stenosis of the proximal left internal carotid artery, measuring approximately 70%. 3. **An incidental finding of potential clinical significance has been found. There is fullness within the left fossa of Rosenmuller, which could indicate the presence of a nasopharyngeal mass. Recommend correlation with direct visualization.** 4. Severe narrowing of the distal V4 segment of the left vertebral artery, distal to the PICA origin. 5. No intracranial occlusion or advanced stenosis. 6. No acute intracranial abnormality. 7. Large bilateral pleural effusions. Electronically Signed   By: Ulyses Jarred M.D.   On: 07/02/2016 22:59   Dg Chest 2 View  Result Date: 06/28/2016 CLINICAL DATA:  Shortness of Breath EXAM: CHEST  2 VIEW COMPARISON:  05/28/2016 FINDINGS: Cardiac shadow is mildly enlarged but stable. Right-sided pleural effusion and right basilar atelectasis is again noted and stable. A small left pleural effusion is noted. No focal confluent infiltrate is seen. No acute bony abnormality is noted.  IMPRESSION: Small bilateral pleural effusions with right basilar atelectasis. Electronically Signed   By: Inez Catalina M.D.   On: 06/28/2016 13:57   Ct Angio Neck W Or Wo Contrast  Result Date: 07/02/2016 CLINICAL DATA:  Carotid stenosis EXAM: CT ANGIOGRAPHY HEAD AND NECK TECHNIQUE: Multidetector CT imaging of the head and neck was performed using the standard protocol during bolus administration of intravenous contrast. Multiplanar CT image reconstructions and MIPs were obtained to evaluate the vascular anatomy. Carotid stenosis measurements (when applicable) are obtained utilizing NASCET criteria, using the distal internal carotid diameter as the denominator. CONTRAST:  50 mL Isovue 370 COMPARISON:  MRI brain 04/03/2012, head CT 10/01/2015. FINDINGS: CT HEAD Brain: No mass lesion, intraparenchymal hemorrhage or extra-axial collection. No evidence of acute infarct. No midline shift or hydrocephalus. There is confluent periventricular hypoattenuation compatible with chronic microvascular disease. Left temporal encephalomalacia is unchanged. Old right cerebellar infarct. Ventricles and sulci are normal for patient age. Calvarium and skull base: The skull is normal. Visualized extracranial soft tissues are normal. Paranasal sinuses: Normal Orbits: Normal CTA HEAD Intracranial internal carotid arteries: Mild atherosclerotic calcification at the cavernous and clinoid segments. Anterior cerebral arteries: Normal Middle cerebral arteries: Normal. Posterior communicating arteries: Present on the right. Absent on the left. Posterior cerebral arteries: Normal. Basilar artery: Normal. Vertebral arteries: Right-dominant. There is loss of the normal opacification of the left vertebral artery V4 segment distal to the origin of the PICA. Superior cerebellar arteries: Normal. Anterior inferior cerebellar arteries: Not clearly visualized. Posterior inferior cerebellar arteries: Normal. Venous sinuses: Normal Anatomic variants:  None Delayed phase: Unremarkable CTA NECK Aortic arch: There is mild atherosclerotic calcification within the aortic arch and proximal great vessels. Normal 3 vessel configuration. Moderate narrowing of the proximal left common carotid artery secondary to atherosclerotic plaque. Both subclavian arteries are patent. Right carotid system: There is atherosclerotic plaque at the right carotid bifurcation resulting in near complete common greater than 90% stenosis. This stenosis covers only a short segment of the artery in the remainder of the right internal carotid artery is normal. Left carotid system: As above, moderate atherosclerotic narrowing of the proximal left common carotid artery. There is atherosclerotic plaquing calcification at the left carotid bifurcation resulting in stenosis of approximately 70%. The remainder of the course of the left internal carotid artery is normal. Vertebral arteries:There is atherosclerotic plaque at the origin of the right vertebral artery, but the origin remains clearly patent. The course and caliber of the right vertebral artery are normal without stenosis. There is atherosclerotic calcification of the origin of the left vertebral artery without significant stenosis. The left vertebral artery is diminutive along its entire course. Skeleton: Multilevel facet arthrosis and osteophytosis. No lytic or blastic osseous lesions. Other neck: Numerous scattered subcentimeter mediastinal lymph nodes. Parotid and submandibular glands are normal. The larynx is normal. There is fullness within the left fossa of Rosenmuller. Unremarkable thyroid Upper chest: There are large bilateral pleural effusions and biapical emphysema. No nodules or masses. IMPRESSION: 1. Critical stenosis of the proximal right internal carotid artery, secondary to the presence of atherosclerotic plaque. 2. Moderate to severe stenosis of the proximal left internal carotid artery, measuring approximately 70%. 3. **An  incidental finding of potential clinical significance has been found. There is fullness within the left fossa of Rosenmuller, which could indicate the presence of a nasopharyngeal mass. Recommend correlation with direct visualization.** 4. Severe narrowing of the distal V4 segment of the left vertebral artery, distal to the PICA origin. 5. No intracranial occlusion or advanced stenosis. 6. No acute intracranial abnormality. 7. Large bilateral pleural effusions. Electronically Signed   By: Ulyses Jarred M.D.   On: 07/02/2016 22:59   Ct Cardiac Morph/pulm Vein W/cm&w/o Ca Score  Addendum Date: 07/07/2016   ADDENDUM REPORT: 07/07/2016 09:31 EXAM: OVER-READ INTERPRETATION  CT CHEST The following report is an over-read performed by radiologist Dr. Barnetta [facility] on 07/07/2016. This over-read does not include interpretation of cardiac or coronary anatomy or pathology. The coronary CTA/TAVR CTA interpretation by the cardiologist is attached. COMPARISON:  None. FINDINGS: Interstitial edema present with moderate  sized right pleural effusion and tiny left pleural effusion. No incidental pulmonary nodules or enlarged lymph nodes identified. Atherosclerosis of the aortic arch and descending thoracic aorta present without evidence of aneurysmal disease. Proximal great vessels show normal branching anatomy and no significant obstructive disease. Visualized bony structures are unremarkable. Visualized upper abdominal structures are unremarkable. IMPRESSION: Interstitial pulmonary edema with moderate right pleural effusion and small left pleural effusion. Electronically Signed   By: Aletta Edouard M.D.   On: 07/07/2016 09:31   Result Date: 07/07/2016 CLINICAL DATA:  52-yaer-old male with severe aortic stenosis. EXAM: Cardiac TAVR CT TECHNIQUE: The patient was scanned on a Philips 256 scanner. A 120 kV retrospective scan was triggered in the descending thoracic aorta at 111 HU's. Gantry rotation  speed was 270 msecs and collimation was .9 mm. 10 mg of iv Metoprolol and no nitro were given. The 3D data set was reconstructed in 5% intervals of the R-R cycle. Systolic and diastolic phases were analyzed on a dedicated work station using MPR, MIP and VRT modes. The patient received 80 cc of contrast. FINDINGS: Aortic Valve: Trileaflet, severely thickened and calcified with severely restricted leaflet opening. There are only mild calcifications extending into the LVOT. Aorta: Normal caliber, moderate diffuse calcifications and severe atherosclerosis on the aortic arch and descending thoracic aorta. No dissection. Sinotubular Junction:  27 x 27 mm Ascending Thoracic Aorta:  32 x 30 mm Aortic Arch:  27 x 27 mm Descending Thoracic Aorta:  28 x 27 mm Sinus of Valsalva Measurements: Non-coronary:  33 mm Right -coronary:  33 mm Left -coronary:  32 mm Coronary Artery Height above Annulus: Left Main:  13 mm Right Coronary:  14 mm Virtual Basal Annulus Measurements: Maximum/Minimum Diameter:  29 x 24 mm Perimeter:  92 mm Area:  574 mm2 Optimum Fluoroscopic Angle for Delivery:  RAO 6 CRA 4 Other findings: There is a very large left atrial appendage with no evidence of a thrombus. Normal pulmonary vein drainage into the left atrium. Dilated pulmonary artery measuring 32 x 29 mm suggestive of pulmonary hypertension. IMPRESSION: 1. Trileaflet, severely thickened and calcified with severely restricted leaflet opening and annular measurements suitable for delivery of 29 mm Edward-SAPIEN TAVR valve. 2. Sufficient coronaries to annulus distance. 3. Optimum Fluoroscopic Angle for Delivery:  RAO 6 CRA 4 Ena Dawley Electronically Signed: By: Ena Dawley On: 07/05/2016 19:58   Dg Chest Port 1 View  Result Date: 06/30/2016 CLINICAL DATA:  Central line placement EXAM: PORTABLE CHEST 1 VIEW COMPARISON:  06/30/2016 FINDINGS: Cardiomediastinal silhouette is stable. Mild congestion/pulmonary edema again noted. There is right  arm PICC line with tip in right atrium. No pneumothorax. IMPRESSION: Right arm PICC line with tip in right atrium. No pneumothorax. Persistent mild congestion/ pulmonary edema. Electronically Signed   By: Lahoma Crocker M.D.   On: 06/30/2016 16:20   Dg Chest Port 1 View  Result Date: 06/30/2016 CLINICAL DATA:  Shortness of breath today. EXAM: PORTABLE CHEST 1 VIEW COMPARISON:  Chest x-rays dated 06/28/2016 and 05/28/2016. FINDINGS: Mild cardiomegaly is stable. There is central pulmonary vascular congestion without overt alveolar pulmonary edema. Suspect small right pleural effusion and mild bibasilar atelectasis. No pneumothorax seen. Atherosclerotic changes noted at the aortic arch. Osseous structures about the chest are unremarkable. IMPRESSION: 1. Cardiomegaly with central pulmonary vascular congestion suggesting mild CHF/volume overload. 2. Cardiomegaly is stable. 3. Probable small right pleural effusion. 4. Aortic atherosclerosis. Electronically Signed   By: Franki Cabot M.D.   On: 06/30/2016 13:37  Cardiac Studies: 2-D echocardiogram 07/07/2016: Left ventricle:  The cavity size was normal. Wall thickness was increased in a pattern of mild LVH. Systolic function was moderately reduced. The estimated ejection fraction was in the range of 35% to 40%.  ------------------------------------------------------------------- Aortic valve:  AV is thickened, calcified with moderately restricted motion Peak and mean gradients through the valve are 38 and 21 mm Hg respectively This is mildly increased from echo of June 2017.  Doppler:  There was mild regurgitation.    VTI ratio of LVOT to aortic valve: 0.25. Valve area (VTI): 0.86 cm^2. Indexed valve area (VTI): 0.44 cm^2/m^2. Peak velocity ratio of LVOT to aortic valve: 0.22. Valve area (Vmax): 0.75 cm^2. Indexed valve area (Vmax): 0.38 cm^2/m^2. Mean velocity ratio of LVOT to aortic valve: 0.22. Valve area (Vmean): 0.75 cm^2. Indexed valve  area (Vmean): 0.38 cm^2/m^2.    Mean gradient (S): 21 mm Hg. Peak gradient (S): 38 mm Hg.  ------------------------------------------------------------------- Mitral valve:   Mildly thickened leaflets .  Doppler:  There was mild to moderate regurgitation.    Peak gradient (D): 6 mm Hg.  ------------------------------------------------------------------- Left atrium:  The atrium was severely dilated.  ------------------------------------------------------------------- Right ventricle:  The cavity size was normal. Wall thickness was normal. Systolic function was mildly reduced.  ------------------------------------------------------------------- Pulmonic valve:    Structurally normal valve.   Cusp separation was normal.  Doppler:  Transvalvular velocity was within the normal range. There was trivial regurgitation.  ------------------------------------------------------------------- Tricuspid valve:   Structurally normal valve.   Leaflet separation was normal.  Doppler:  Transvalvular velocity was within the normal range. There was mild regurgitation.  ------------------------------------------------------------------- Right atrium:  The atrium was mildly dilated.  ------------------------------------------------------------------- Pericardium:  There was no pericardial effusion.  ------------------------------------------------------------------- Measurements   Left ventricle                            Value          Reference  LV ID, ED, PLAX chordal                   50    mm       43 - 52  LV ID, ES, PLAX chordal           (H)     42.4  mm       23 - 38  LV fx shortening, PLAX chordal    (L)     15    %        >=29  LV PW thickness, ED                       11.5  mm       ---------  IVS/LV PW ratio, ED                       1.24           <=1.3  Stroke volume, 2D                         48    ml       ---------  Stroke volume/bsa, 2D                     25    ml/m^2    ---------  LV end-systolic volume, 1-p Q000111Q  99    ml       ---------  LV ejection fraction, 1-p A4C             36    %        ---------  LV end-systolic volume/bsa, 1-p           51    ml/m^2   ---------  Q000111Q  LV end-diastolic volume, 2-p              146   ml       ---------  LV end-systolic volume, 2-p               85    ml       ---------  LV ejection fraction, 2-p                 42    %        ---------  Stroke volume, 2-p                        61    ml       ---------  LV end-diastolic volume/bsa, 2-p          75    ml/m^2   ---------  LV end-systolic volume/bsa, 2-p           43    ml/m^2   ---------  Stroke volume/bsa, 2-p                    31.3  ml/m^2   ---------  LV e&', lateral                            11.5  cm/s     ---------  LV E/e&', lateral                          11.04          ---------  LV e&', medial                             8.09  cm/s     ---------  LV E/e&', medial                           15.7           ---------  LV e&', average                            9.8   cm/s     ---------  LV E/e&', average                          12.97          ---------    Ventricular septum                        Value          Reference  IVS thickness, ED                         14.3  mm       ---------    LVOT  Value          Reference  LVOT ID, S                                21    mm       ---------  LVOT area                                 3.46  cm^2     ---------  LVOT peak velocity, S                     66.4  cm/s     ---------  LVOT mean velocity, S                     48.3  cm/s     ---------  LVOT VTI, S                               14    cm       ---------    Aortic valve                              Value          Reference  Aortic valve peak velocity, S             308   cm/s     ---------  Aortic valve mean velocity, S             222   cm/s     ---------  Aortic valve VTI, S                       56.1  cm        ---------  Aortic mean gradient, S                   21    mm Hg    ---------  Aortic peak gradient, S                   38    mm Hg    ---------  VTI ratio, LVOT/AV                        0.25           ---------  Aortic valve area, VTI                    0.86  cm^2     ---------  Aortic valve area/bsa, VTI                0.44  cm^2/m^2 ---------  Velocity ratio, peak, LVOT/AV             0.22           ---------  Aortic valve area, peak velocity          0.75  cm^2     ---------  Aortic valve area/bsa, peak               0.38  cm^2/m^2 ---------  velocity  Velocity ratio, mean, LVOT/AV  0.22           ---------  Aortic valve area, mean velocity          0.75  cm^2     ---------  Aortic valve area/bsa, mean               0.38  cm^2/m^2 ---------  velocity    Aorta                                     Value          Reference  Aortic root ID, ED                        36    mm       ---------    Left atrium                               Value          Reference  LA ID, A-P, ES                            43    mm       ---------  LA ID/bsa, A-P                            2.2   cm/m^2   <=2.2  LA volume, S                              68.7  ml       ---------  LA volume/bsa, S                          35.1  ml/m^2   ---------  LA volume, ES, 1-p A4C                    51.5  ml       ---------  LA volume/bsa, ES, 1-p A4C                26.3  ml/m^2   ---------  LA volume, ES, 1-p A2C                    82.7  ml       ---------  LA volume/bsa, ES, 1-p A2C                42.2  ml/m^2   ---------    Mitral valve                              Value          Reference  Mitral E-wave peak velocity               127   cm/s     ---------  Mitral deceleration time                  187   ms       150 - 230  Mitral peak gradient, D  6     mm Hg    ---------  Mitral regurg VTI, PISA                   168   cm       ---------    Pulmonary arteries                         Value          Reference  PA pressure, S, DP                (H)     41    mm Hg    <=30    Tricuspid valve                           Value          Reference  Tricuspid regurg peak velocity            287   cm/s     ---------  Tricuspid peak RV-RA gradient             33    mm Hg    ---------    Systemic veins                            Value          Reference  Estimated CVP                             8     mm Hg    ---------    Right ventricle                           Value          Reference  TAPSE                                     14.6  mm       ---------  RV pressure, S, DP                (H)     41    mm Hg    <=30  RV s&', lateral, S                         11    cm/s     ---------  Cardiac catheterization 06/17/2016: Conclusion     Ost Cx to Prox Cx lesion, 50 %stenosed.  Mid Cx to Dist Cx lesion, 80 %stenosed.  Mid RCA lesion, 40 %stenosed.  Mid LAD lesion, 75 %stenosed.  2nd Diag lesion, 75 %stenosed.  Hemodynamic findings consistent with moderate pulmonary hypertension.  Unable to cross the aortic valve.  PA sat 52%. CO 3.2 L/min.   Continue with plans for CT surgery evaluation, for valve disease and two vessel CAD.    Gated Cardiac CTA: FINDINGS: Aortic Valve: Trileaflet, severely thickened and calcified with severely restricted leaflet opening. There are only mild calcifications extending into the LVOT.  Aorta: Normal caliber, moderate diffuse calcifications and severe atherosclerosis on the aortic arch and descending thoracic aorta. No dissection.  Sinotubular Junction:  27 x 27  mm  Ascending Thoracic Aorta:  32 x 30 mm  Aortic Arch:  27 x 27 mm  Descending Thoracic Aorta:  28 x 27 mm  Sinus of Valsalva Measurements:  Non-coronary:  33 mm  Right -coronary:  33 mm  Left -coronary:  32 mm  Coronary Artery Height above Annulus:  Left Main:  13 mm  Right Coronary:  14 mm  Virtual Basal Annulus  Measurements:  Maximum/Minimum Diameter:  29 x 24 mm  Perimeter:  92 mm  Area:  574 mm2  Optimum Fluoroscopic Angle for Delivery:  RAO 6 CRA 4  Other findings:  There is a very large left atrial appendage with no evidence of a thrombus.  Normal pulmonary vein drainage into the left atrium.  Dilated pulmonary artery measuring 32 x 29 mm suggestive of pulmonary hypertension.  IMPRESSION: 1. Trileaflet, severely thickened and calcified with severely restricted leaflet opening and annular measurements suitable for delivery of 29 mm Edward-SAPIEN TAVR valve.  2. Sufficient coronaries to annulus distance.  3. Optimum Fluoroscopic Angle for Delivery:  RAO 6 CRA 4  ASSESSMENT AND PLAN:  82 yo male with complex cardiovascular disease with severe low gradient aortic stenosis and low-output heart failure. Complexity is increased by severe carotid stenosis, chronic atrial fibrillation, and plavix allergy. I have reviewed his cardiac catheterization and echo reports and images. Cardiac catheterization is notable for moderate diffuse nonobstructive CAD. His most recent echo (done on milrinone) shows findings consistent with moderate/severe LV systolic dysfunction, probable severe low-gradient aortic stenosis, and moderate mitral regurgitation. The degree of mitral regurgitation is less than seen on his initial echo.   I have reviewed the CTA images of his chest/abdomen/pelvis. The formal interpretation is currently pending. There are atheromatous changes in the aortic arch as well as the thoracicoabdominal aorta with fairly heavy atheroma in the abdominal aorta. The iliofemoral vessels have very mild calcification and moderate tortuosity. The minimal lumen diameter from the common femoral through the iliac arteries is greater than 6.5 mm on both sides without associated calcification.  I think this patient will be a candidate for transfemoral TAVR via a percutaneous approach from  either the right or left femoral artery. He will require a 29 mm transcatheter heart valve based on his annular dimensions. I think he would be at very high risk of bailout sternotomy/open surgery if catastrophic complication from TAVR were to occur. Will discuss further with Dr Roxy Manns, Dr Haroldine Laws and the multidisciplinary heart team but favor starting him on ticagrelor for planned carotid stenting on Monday followed by TAVR the following week. His chronic atrial fibrillation is a significant issue, but bleeding risk with ticagrelor and oral anticoagulation is prohibitive in my opinion and with carotid stenting would favor approach of 4-6 weeks of DAPT followed by discontinuation of ticagrelor and initiation of [place].  Deatra James MD, [facility] 07/08/2016, 11:42 AM

## 2016-07-08 NOTE — Progress Notes (Addendum)
[address]       ,[place] [address]             [phone]        CARDIOTHORACIC SURGERY PROGRESS NOTE  Subjective: No complaints.  Significant skin rash has developed that is felt likely secondary to Plavix  Objective: Vital signs: BP Readings from Last 1 Encounters:  07/08/16 (!) 149/61   Pulse Readings from Last 1 Encounters:  07/08/16 87   Resp Readings from Last 1 Encounters:  07/08/16 20   Temp Readings from Last 1 Encounters:  07/08/16 (!) 96.1 F (35.6 C) (Axillary)    Hemodynamics: CVP:  [5 mmHg-6 mmHg] 5 mmHg  Mixed venous co-ox 52.4%  Physical Exam:  Rhythm:   Afib w/ controlled rate   Breath sounds: Scattered rhonchi  Heart sounds:  Irregular w/ systolic murmur  Incisions:  n/a  Abdomen:  soft  Extremities:  Warm, no edema   Intake/Output from previous day: 08/24 0701 - 08/25 0700 In: 1329.6 [P.O.:420; I.V.:909.6] Out: 1625 [Urine:1625] Intake/Output this shift: Total I/O In: 887.3 [P.O.:720; I.V.:167.3] Out: 775 [Urine:775]  Lab Results:  CBC: Recent Labs  07/07/16 0432 07/08/16 0410  WBC 7.6 9.8  HGB 14.3 14.8  HCT 42.7 43.9  PLT 228 252    BMET:  Recent Labs  07/07/16 0432 07/08/16 0410  NA 132* 130*  K 4.8 5.3*  CL 93* 93*  CO2 31 28  GLUCOSE 248* 263*  BUN 20 18  CREATININE 1.18 1.35*  CALCIUM 9.4 9.8     PT/INR:  No results for input(s): LABPROT, INR in the last 72 hours.  CBG (last 3)   Recent Labs  07/07/16 2110 07/08/16 0818 07/08/16 1237  GLUCAP 252* 210* 272*    ABG    Component Value Date/Time   PHART 7.458 (H) 06/17/2016 1035   PCO2ART 38.7 06/17/2016 1035   PO2ART 80.0 06/17/2016 1035   HCO3 29.2 (H) 06/17/2016 1053   TCO2 31 06/17/2016 1053   ACIDBASEDEF 0.0 07/25/2011 1319   O2SAT 52.4 07/08/2016 0415    CXR: N/A   Transthoracic Echocardiography  Patient:    [name] [name], [name] [name] MR #:       [id number] Study Date: 07/07/2016 Gender:     M Age:         82 Height:     185.4 cm Weight:     74.8 kg BSA:        1.96 m^2 Pt. Status: Room:   PERFORMING   [facility], Inpatient  SONOGRAPHER  Darlina Sicilian, RDCS  cc:  ------------------------------------------------------------------- LV EF: 35% -   40%  ------------------------------------------------------------------- Indications:      Aortic stenosis 424.1.  ------------------------------------------------------------------- History:   PMH:  Carotid Artery Stenosis. Bilateral Effusion. Atrial fibrillation.  Coronary artery disease.  ------------------------------------------------------------------- Study Conclusions  - Left ventricle: The cavity size was normal. Wall thickness was   increased in a pattern of mild LVH. Systolic function was   moderately reduced. The estimated ejection fraction was in the   range of 35% to 40%. - Aortic valve: AV is thickened, calcified with moderately   restricted motion Peak and mean gradients through the valve are   38 and 21 mm Hg respectively This is mildly increased from echo   of June 2017. There was mild regurgitation. - Mitral valve: There was mild to moderate regurgitation. - Left atrium: The atrium was severely dilated. - Right ventricle: Systolic function was mildly reduced. - Right  atrium: The atrium was mildly dilated. - Pulmonary arteries: PA peak pressure: 41 mm Hg (S).  Impressions:  - Poor acoustic windows lmit study.  Complications:  Patient had to turn far on to his left side occluding windows for PEDOF.  ------------------------------------------------------------------- Study data:  Comparison was made to the study of 06/01/2016.  Study status:  Routine.  Procedure:  The patient reported no pain pre or post test. Transthoracic echocardiography. Image quality was poor. The study was technically difficult, as a result of body habitus. Intravenous contrast (Definity) was administered.  Study completion:  There  were no complications.          Transthoracic echocardiography.  M-mode, complete 2D, spectral Doppler, and color Doppler.  Birthdate:  Patient birthdate: [birth year].  Age:  Patient is 82 yr old.  Sex:  Gender: male.    BMI: 21.8 kg/m^2.  Blood pressure:     127/66  Patient status:  Inpatient.  Study date: Study date: 07/07/2016. Study time: 03:20 PM.  Location:  ICU/CCU   -------------------------------------------------------------------  ------------------------------------------------------------------- Left ventricle:  The cavity size was normal. Wall thickness was increased in a pattern of mild LVH. Systolic function was moderately reduced. The estimated ejection fraction was in the range of 35% to 40%.  ------------------------------------------------------------------- Aortic valve:  AV is thickened, calcified with moderately restricted motion Peak and mean gradients through the valve are 38 and 21 mm Hg respectively This is mildly increased from echo of June 2017.  Doppler:  There was mild regurgitation.    VTI ratio of LVOT to aortic valve: 0.25. Valve area (VTI): 0.86 cm^2. Indexed valve area (VTI): 0.44 cm^2/m^2. Peak velocity ratio of LVOT to aortic valve: 0.22. Valve area (Vmax): 0.75 cm^2. Indexed valve area (Vmax): 0.38 cm^2/m^2. Mean velocity ratio of LVOT to aortic valve: 0.22. Valve area (Vmean): 0.75 cm^2. Indexed valve area (Vmean): 0.38 cm^2/m^2.    Mean gradient (S): 21 mm Hg. Peak gradient (S): 38 mm Hg.  ------------------------------------------------------------------- Mitral valve:   Mildly thickened leaflets .  Doppler:  There was mild to moderate regurgitation.    Peak gradient (D): 6 mm Hg.  ------------------------------------------------------------------- Left atrium:  The atrium was severely dilated.  ------------------------------------------------------------------- Right ventricle:  The cavity size was normal. Wall thickness was normal.  Systolic function was mildly reduced.  ------------------------------------------------------------------- Pulmonic valve:    Structurally normal valve.   Cusp separation was normal.  Doppler:  Transvalvular velocity was within the normal range. There was trivial regurgitation.  ------------------------------------------------------------------- Tricuspid valve:   Structurally normal valve.   Leaflet separation was normal.  Doppler:  Transvalvular velocity was within the normal range. There was mild regurgitation.  ------------------------------------------------------------------- Right atrium:  The atrium was mildly dilated.  ------------------------------------------------------------------- Pericardium:  There was no pericardial effusion.  ------------------------------------------------------------------- Measurements   Left ventricle                            Value          Reference  LV ID, ED, PLAX chordal                   50    mm       43 - 52  LV ID, ES, PLAX chordal           (H)     42.4  mm       23 - 38  LV fx shortening, PLAX chordal    (L)  15    %        >=29  LV PW thickness, ED                       11.5  mm       ---------  IVS/LV PW ratio, ED                       1.24           <=1.3  Stroke volume, 2D                         48    ml       ---------  Stroke volume/bsa, 2D                     25    ml/m^2   ---------  LV end-systolic volume, 1-p Q000111Q           99    ml       ---------  LV ejection fraction, 1-p A4C             36    %        ---------  LV end-systolic volume/bsa, 1-p           51    ml/m^2   ---------  Q000111Q  LV end-diastolic volume, 2-p              146   ml       ---------  LV end-systolic volume, 2-p               85    ml       ---------  LV ejection fraction, 2-p                 42    %        ---------  Stroke volume, 2-p                        61    ml       ---------  LV end-diastolic volume/bsa, 2-p          75    ml/m^2    ---------  LV end-systolic volume/bsa, 2-p           43    ml/m^2   ---------  Stroke volume/bsa, 2-p                    31.3  ml/m^2   ---------  LV e&', lateral                            11.5  cm/s     ---------  LV E/e&', lateral                          11.04          ---------  LV e&', medial                             8.09  cm/s     ---------  LV E/e&', medial  15.7           ---------  LV e&', average                            9.8   cm/s     ---------  LV E/e&', average                          12.97          ---------    Ventricular septum                        Value          Reference  IVS thickness, ED                         14.3  mm       ---------    LVOT                                      Value          Reference  LVOT ID, S                                21    mm       ---------  LVOT area                                 3.46  cm^2     ---------  LVOT peak velocity, S                     66.4  cm/s     ---------  LVOT mean velocity, S                     48.3  cm/s     ---------  LVOT VTI, S                               14    cm       ---------    Aortic valve                              Value          Reference  Aortic valve peak velocity, S             308   cm/s     ---------  Aortic valve mean velocity, S             222   cm/s     ---------  Aortic valve VTI, S                       56.1  cm       ---------  Aortic mean gradient, S                   21    mm Hg    ---------  Aortic peak gradient, S  38    mm Hg    ---------  VTI ratio, LVOT/AV                        0.25           ---------  Aortic valve area, VTI                    0.86  cm^2     ---------  Aortic valve area/bsa, VTI                0.44  cm^2/m^2 ---------  Velocity ratio, peak, LVOT/AV             0.22           ---------  Aortic valve area, peak velocity          0.75  cm^2     ---------  Aortic valve area/bsa, peak               0.38  cm^2/m^2  ---------  velocity  Velocity ratio, mean, LVOT/AV             0.22           ---------  Aortic valve area, mean velocity          0.75  cm^2     ---------  Aortic valve area/bsa, mean               0.38  cm^2/m^2 ---------  velocity    Aorta                                     Value          Reference  Aortic root ID, ED                        36    mm       ---------    Left atrium                               Value          Reference  LA ID, A-P, ES                            43    mm       ---------  LA ID/bsa, A-P                            2.2   cm/m^2   <=2.2  LA volume, S                              68.7  ml       ---------  LA volume/bsa, S                          35.1  ml/m^2   ---------  LA volume, ES, 1-p A4C                    51.5  ml       ---------  LA volume/bsa, ES, 1-p A4C  26.3  ml/m^2   ---------  LA volume, ES, 1-p A2C                    82.7  ml       ---------  LA volume/bsa, ES, 1-p A2C                42.2  ml/m^2   ---------    Mitral valve                              Value          Reference  Mitral E-wave peak velocity               127   cm/s     ---------  Mitral deceleration time                  187   ms       150 - 230  Mitral peak gradient, D                   6     mm Hg    ---------  Mitral regurg VTI, PISA                   168   cm       ---------    Pulmonary arteries                        Value          Reference  PA pressure, S, DP                (H)     41    mm Hg    <=30    Tricuspid valve                           Value          Reference  Tricuspid regurg peak velocity            287   cm/s     ---------  Tricuspid peak RV-RA gradient             33    mm Hg    ---------    Systemic veins                            Value          Reference  Estimated CVP                             8     mm Hg    ---------    Right ventricle                           Value          Reference  TAPSE                                      14.6  mm       ---------  RV pressure, S, DP                (  H)     41    mm Hg    <=30  RV s&', lateral, S                         11    cm/s     ---------  Legend: (L)  and  (H)  mark values outside specified reference range.  ------------------------------------------------------------------- Prepared and Electronically Authenticated by  Dorris Carnes, M.D. 2017-08-24T18:06:54   Assessment/Plan:  I have personally reviewed the follow-up echocardiogram performed yesterday. Findings remain consistent with severe low gradient low ejection fraction aortic stenosis. The patient has moderate mitral regurgitation that appears somewhat improved in comparison with previous transesophageal echocardiogram performed prior to initiation of milrinone and aggressive medical management for decompensated congestive heart failure.  This is somewhat encouraging, but the patient's condition remains precarious.  I have discussed matters at length with Dr. Haroldine Laws and [name].  The development of a significant allergy to Plavix complicates the patient's medical therapy to a significant degree. Preoperative dual antiplatelet therapy using aspirin and Brilinta could come was significant risks of bleeding following transcatheter aortic valve replacement. However, the patient probably should not be considered a candidate for median sternotomy as a bail out during transcatheter aortic valve replacement because of his advanced age, severe left ventricular dysfunction, and severe physical deconditioning.   Preoperative use of an intravenous IIb IIIa platelet inhibitor with short half-life could be considered, but might be associated with increased risk during the interim period of time. The patient appears to be an acceptable candidate for percutaneous transfemoral approach for transcatheter aortic valve replacement, and the likelihood of severe bleeding complications following percutaneous approach on Brilinta is  probably acceptably low, although a 29 mm valve requires a 16 French sheath in the femoral artery. A significant vascular complication could be disastrous. Perhaps more concerning is the fact that the patient will need to be off of warfarin or other oral anticoagulation therapy for his atrial fibrillation while he is treated using ASA and Brilinta.  This could be avoided if the patient were to undergo high risk carotid endarterectomy using conventional surgical techniques rather than carotid stenting procedure.  Whether or not the increased risks associated with conventional surgery and the need for general anesthesia would be worth the potential benefits of avoiding the need for dual antiplatelet therapy is difficult to say.  I will defer this decision to the discretion of Dr. Haroldine Laws, [name], and the vascular surgery team.  Discussed with the patient and his family. We will follow up again next week after the patient's carotid stenting has been performed.    I spent in excess of 30 minutes during the conduct of this hospital encounter and >50% of this time involved direct face-to-face encounter with the patient for counseling and/or coordination of their care.  Rexene Alberts, MD 07/08/2016 4:20 PM

## 2016-07-08 NOTE — Progress Notes (Signed)
Patient ID: [name] [name], male   DOB: 1932-03-27, 82 y.o.   MRN: [id number]    Advanced Heart Failure Rounding Note   Subjective:    He remains in atrial fibrillation with HR 80s-100s on amiodarone gtt and heparin gtt.   Remains on milrinone 0.25 mcg. CVP 5.  Yesterday he complained of rash on back. Started on benadyrly and prednisone.   Denies SOB. Ambulated 150 feet.    TEE (8/17): EF 30-35%, severe AS, moderate to severe MR (suspect functional MR).   Carotid dopplers > 80% RICA stenosis.  CTA head/neck with >90% (critical) RICA stenosis and [id number] LICA.   RHC/LHC 06/17/16 RA 11 PCWP 25  CO/CI 3.25/1.4   Ost Cx to Prox Cx lesion, 50 %stenosed.  Mid Cx to Dist Cx lesion, 80 %stenosed.  Mid RCA lesion, 40 %stenosed.  Mid LAD lesion, 75 %stenosed.  2nd Diag lesion, 75 %stenosed.  Hemodynamic findings consistent with moderate pulmonary hypertension.  Unable to cross the aortic valve.  PA sat 52%. CO 3.2 L/min.   Objective:   Weight Range:  Vital Signs:   Temp:  [97.4 F (36.3 C)-98.7 F (37.1 C)] 98.1 F (36.7 C) (08/25 0400) Pulse Rate:  [44-95] 95 (08/25 0400) Resp:  [14-30] 19 (08/25 0400) BP: (121-149)/(43-97) 125/56 (08/25 0400) SpO2:  [91 %-100 %] 93 % (08/25 0400) Last BM Date: 07/03/16  Weight change: Filed Weights   07/05/16 0500 07/06/16 0400 07/06/16 0900  Weight: 207 lb 10.8 oz (94.2 kg) 205 lb (93 kg) 206 lb 9.1 oz (93.7 kg)    Intake/Output:   Intake/Output Summary (Last 24 hours) at 07/08/16 0751 Last data filed at 07/08/16 0700  Gross per 24 hour  Intake           1329.6 ml  Output             1625 ml  Net           -295.4 ml     Physical Exam: CVP 5 General:  NAD. In the bed .  HEENT: normal Neck: supple. JVP 5-6. Carotids 2+ bilat; no bruits. No thyromegaly or nodule noted. Cor: PMI nondisplaced. Irregular ate & rhythm. No S3.  2/6 systolic crescendo-decrescendo murmur with muffling of S2.   Lungs: Decreased in bases on 4 liters    Abdomen: soft, NT, ND, no HSM. No bruits or masses. +BS  Extremities: no cyanosis, clubbing, rash,  R and LLE no edema.  Neuro: alert & orientedx3, cranial nerves grossly intact. moves all 4 extremities w/o difficulty. Affect pleasant Skin: Red rash on back   Telemetry: A fib 80-90s  Labs: Basic Metabolic Panel:  Recent Labs Lab 07/04/16 0534 07/05/16 0345 07/06/16 0415 07/07/16 0432 07/08/16 0410  NA 133* 135 132* 132* 130*  K 4.5 4.7 4.9 4.8 5.3*  CL 96* 98* 93* 93* 93*  CO2 28 30 30 31 28   GLUCOSE 273* 180* 190* 248* 263*  BUN 13 12 16 20 18   CREATININE 1.11 1.21 1.22 1.18 1.35*  CALCIUM 9.2 9.4 9.6 9.4 9.8    Liver Function Tests: No results for input(s): AST, ALT, ALKPHOS, BILITOT, PROT, ALBUMIN in the last 168 hours. No results for input(s): LIPASE, AMYLASE in the last 168 hours. No results for input(s): AMMONIA in the last 168 hours.  CBC:  Recent Labs Lab 07/04/16 0534 07/05/16 0345 07/06/16 0415 07/07/16 0432 07/08/16 0410  WBC 5.5 7.2 8.4 7.6 9.8  HGB 12.8* 13.6 14.2 14.3 14.8  HCT 39.2 41.3  41.8 42.7 43.9  MCV 90.5 90.2 88.9 89.1 88.7  PLT 199 220 236 228 252    Cardiac Enzymes: No results for input(s): CKTOTAL, CKMB, CKMBINDEX, TROPONINI in the last 168 hours.  BNP: BNP (last 3 results)  Recent Labs  05/03/16 1305 05/28/16 1617 06/28/16 1316  BNP 232.0* 294.0* 222.0*    ProBNP (last 3 results) No results for input(s): PROBNP in the last 8760 hours.    Other results:  Imaging: No results found.   Medications:     Scheduled Medications: . antiseptic oral rinse  7 mL Mouth Rinse BID  . aspirin  81 mg Oral Daily  . clopidogrel  75 mg Oral Daily  . digoxin  0.125 mg Oral Daily  . furosemide  40 mg Oral BID  . insulin aspart  0-9 Units Subcutaneous TID WC  . predniSONE  30 mg Oral BID WC   Followed by  . [START ON 07/10/2016] predniSONE  25 mg Oral BID WC   Followed by  . [START ON 07/13/2016] predniSONE  20 mg Oral BID WC    Followed by  . [START ON 07/16/2016] predniSONE  15 mg Oral BID WC   Followed by  . [START ON 07/19/2016] predniSONE  10 mg Oral BID WC  . rosuvastatin  5 mg Oral q1800  . sodium chloride  500 mL Intravenous Once  . sodium chloride flush  10-40 mL Intracatheter Q12H  . sodium chloride flush  3 mL Intravenous Q12H  . traZODone  100 mg Oral QHS    Infusions: . amiodarone 30 mg/hr (07/08/16 0500)  . heparin 1,400 Units/hr (07/08/16 0500)  . milrinone 0.25 mcg/kg/min (07/08/16 0600)    PRN Medications: sodium chloride, acetaminophen **OR** acetaminophen, bisacodyl, diphenhydrAMINE, HYDROcodone-acetaminophen, nortriptyline, ondansetron **OR** ondansetron (ZOFRAN) IV, sodium chloride flush, sodium chloride flush   Assessment:   1. Cardiogenic Shock- TEE with EF 30-35% 2. Acute Respiratory Failure 3. A fib RVR 4. CAD -  2 vessel disease 5. Valvular Heart Disease-  Severe AS/moderate-severe MR (MR may be functional).  6. Caroltid Stenosis- Severe carotid stenosis RICA >80% stenosis by carotid dopplers, CTA head/neck with critical >90% RICA stenosis an [id number] LICA.  7. Bilateral Effusions  8. Nasopharyngeal mass 9. Rash- 10. Hyperkalemia   Plan/Discussion:    CVP 5. Continue  lasix 40 mg twice a day. Co-ox 52% on milrinone 0.25.  Will continue. He is on digoxin. Renal function stable. K stopped today.   Remains in A fib. HF 80s. Stop amio drip and start amio 200 mg twice a daily. Continue heparin drip.    2 vessel CAD, on ASA 81 and added low dose Crestor (prior myalgias with simvastatin).   No BB with low output. May be able to start low dose ACEI soon if creatinine remains stable.   He is too high risk for conventional surgery (low gradient severe AS and moderate to severe probably functional MR).  Planning TAVR one week after stent.     Vascular planning for stent Monday.    Rash--> back . Onset 48 hours. ? Plavix which was started 48 hours ago or possible digoxin but this was  started 8/18. Dig level 0.3. [name] Plavix stopped and started on brillinta. Continue benadryl and prednisone.    PT recommending HH.    Length of Stay: Lyons Switch NP-C  07/08/2016, 7:51 AM  Patient seen and examined with Darrick Grinder, NP. We discussed all aspects of the encounter. I agree with the assessment and  plan as stated above.   Looks very good. Continue to change meds to po. Co-ox on milrinone remains low. Will continue inotropic support. Rash better off Plavix. Continue Brillinta. Plan coronary stent on Monday. Then TAVR 1 week later. Will either need to stay in house or go home with milrinone after carotid stenting. Will see how he does. Will need to discuss anti-coag regimen with TAVR team in light of Plavix allergy, upcoming carotid stent and PAF. Might benefit from TEE/DCCV at some point.   Mubashir Mallek,MD 8:53 AM

## 2016-07-08 NOTE — Progress Notes (Signed)
22 beat run of v tach noted on monitor, pt awake, alert, asymptomatic.  V tach self-resolved. Will continue to monitor pt closely.

## 2016-07-08 NOTE — Progress Notes (Signed)
CARDIAC REHAB PHASE I   PRE:  Rate/Rhythm: 91 a fib  BP:  Sitting: 126/56        SaO2: 100 RA  MODE:  Ambulation: 500 ft   POST:  Rate/Rhythm: 106 a fib c/ PVCs  BP:  Sitting: 153/67         SaO2: 100 RA  Pt ambulated 500 ft on RA, IV, rolling walker, gait belt, assist x2, fairly steady gait, tolerated well, denies any complaints. Pt with some fatigue with increased distance, however, overall, pt demonstrated improved gait and posture today, declined rest stop. Pt assisted to bathroom, then to bed after walk, call bell within reach. Will follow as assist x2 for safety/equipment.   [id number] Lenna Sciara, RN, BSN 07/08/2016 12:01 PM

## 2016-07-08 NOTE — Progress Notes (Signed)
   VASCULAR SURGERY ASSESSMENT & PLAN:  For right carotid stent Monday by Dr Oneida Alar. This is for a >80% right carotid stenosis which is likely symptomatic. He is on Plavix.  His crt is 1.35 which is up slightly. Given his CHF will leave hydration over the weekend to primary service.   SUBJECTIVE: No complaints  PHYSICAL EXAM: Vitals:   07/07/16 2044 07/08/16 0000 07/08/16 0400 07/08/16 0820  BP:   (!) 125/56 128/69  Pulse: 81  95 80  Resp: (!) 30  19 12   Temp: 97.5 F (36.4 C) 98.1 F (36.7 C) 98.1 F (36.7 C) 97.5 F (36.4 C)  TempSrc: Oral Oral Oral Oral  SpO2: 93%  93% 91%  Weight:      Height:       Palpable right femoral pulse.   LABS: Lab Results  Component Value Date   WBC 9.8 07/08/2016   HGB 14.8 07/08/2016   HCT 43.9 07/08/2016   MCV 88.7 07/08/2016   PLT 252 07/08/2016   Lab Results  Component Value Date   CREATININE 1.35 (H) 07/08/2016   Lab Results  Component Value Date   INR 1.53 06/30/2016   CBG (last 3)   Recent Labs  07/07/16 1129 07/07/16 1606 07/07/16 2110  GLUCAP 209* 241* 252*    Principal Problem:   Cardiogenic shock (HCC) Active Problems:   Hypertension   Cerebrovascular disease   Atrial fibrillation (HCC)   Hyperlipidemia   Aortic stenosis   Diabetes mellitus ([place])   Carotid stenosis   Anemia of chronic disease   Systolic congestive heart failure (HCC)   Dyspnea   CAD (coronary artery disease), native coronary artery   Acute on chronic combined systolic and diastolic CHF, NYHA class 4 ([place])   Acute respiratory failure with hypoxia ([place])    Gae Gallop [id number] 07/08/2016

## 2016-07-08 NOTE — Progress Notes (Signed)
ANTICOAGULATION CONSULT NOTE - Follow Up Consult  Pharmacy Consult for Heparin Indication: atrial fibrillation  Allergies  Allergen Reactions  . Ambien [Zolpidem Tartrate] Other (See Comments)    Sleep walks  . Cholestatin   . Lipitor [Atorvastatin Calcium] Other (See Comments)    myalgias  . Ranitidine Other (See Comments)    Chest discomfort  . Simvastatin Other (See Comments)    Myalgias  . Xanax Xr [Alprazolam Er]     Tightness in chest    Patient Measurements: Height: 6\' 1"  (185.4 cm) Weight: 206 lb 9.1 oz (93.7 kg) IBW/kg (Calculated) : 79.9  Vital Signs: Temp: 98.1 F (36.7 C) (08/25 0400) Temp Source: Oral (08/25 0400) BP: 125/56 (08/25 0400) Pulse Rate: 95 (08/25 0400)  Labs:  Recent Labs  07/06/16 0415 07/07/16 0428 07/07/16 0432 07/08/16 0410  HGB 14.2  --  14.3 14.8  HCT 41.8  --  42.7 43.9  PLT 236  --  228 252  HEPARINUNFRC 0.52 0.58  --  0.70  CREATININE 1.22  --  1.18 1.35*    Estimated Creatinine Clearance: 47.7 mL/min (by C-G formula based on SCr of 1.35 mg/dL).  Medications: Heparin @ 1400 units/hr  Assessment: 83yom continues on heparin for afib (while coumadin on hold) in the setting of cardiogenic shock.   Heparin level remains therapeutic at 0.7 but now at upper end of goal. CBC stable. No bleeding reported. Noted plan for carotid stent monday and then TAVR the following week.  Rash on back and arm: most likely from plavix that was started on 8/22. Started diphenhydramine and steroids but will now switch to ticagrelor. There are some limited reports of cross reactivity with prasugrel but not studies looking at ticagrelor.  Goal of Therapy:  Heparin level 0.3-0.7 units/ml Monitor platelets by anticoagulation protocol: Yes   Plan:  1) Reduce heparin to 1300 units/hr 2) Daily heparin level, CBC 3) Follow up rash  Erin Hearing PharmD., BCPS Clinical Pharmacist Pager [phone] 07/08/2016 8:20 AM

## 2016-07-08 NOTE — Progress Notes (Signed)
Inpatient Diabetes Program Recommendations  AACE/ADA: New Consensus Statement on Inpatient Glycemic Control (2015)  Target Ranges:  Prepandial:   less than 140 mg/dL      Peak postprandial:   less than 180 mg/dL (1-2 hours)      Critically ill patients:  140 - 180 mg/dL   Results for [name] [name], [name] [name] (MRN [id number]) as of 07/08/2016 09:45  Ref. Range 07/07/2016 08:03 07/07/2016 11:23 07/07/2016 11:29 07/07/2016 16:06 07/07/2016 21:10 07/08/2016 08:18  Glucose-Capillary Latest Ref Range: 65 - 99 mg/dL 224 (H) 232 (H) 209 (H) 241 (H) 252 (H) 210 (H)   Review of Glycemic Control  Diabetes history: DM2 Outpatient Diabetes medications: Metformin 250 mg BID Current orders for Inpatient glycemic control: Novolog 0-9 units TID with meals  Inpatient Diabetes Program Recommendations: Correction (SSI): Noted patient received one time dose of Prednisone 30 mg on 07/07/16. Please consider increasing Novolog correction to moderate scale and adding Novolog bedtime correction scale.  Thanks, Barnie Alderman, RN, MSN, CDE Diabetes Coordinator Inpatient Diabetes Program [phone] (Team Pager from [place] to [place]) [phone] ([place] office) [phone] [facility] office) [phone] [facility] office)

## 2016-07-09 LAB — BASIC METABOLIC PANEL
ANION GAP: 11 (ref 5–15)
BUN: 22 mg/dL — ABNORMAL HIGH (ref 6–20)
CHLORIDE: 92 mmol/L — AB (ref 101–111)
CO2: 29 mmol/L (ref 22–32)
Calcium: 9.8 mg/dL (ref 8.9–10.3)
Creatinine, Ser: 1.53 mg/dL — ABNORMAL HIGH (ref 0.61–1.24)
GFR calc Af Amer: 47 mL/min — ABNORMAL LOW (ref >60)
GFR, EST NON AFRICAN AMERICAN: 41 mL/min — AB (ref >60)
GLUCOSE: 220 mg/dL — AB (ref 65–99)
POTASSIUM: 3.8 mmol/L (ref 3.5–5.1)
Sodium: 132 mmol/L — ABNORMAL LOW (ref 135–145)

## 2016-07-09 LAB — CBC
HCT: 43.8 % (ref 39.0–52.0)
Hemoglobin: 14.7 g/dL (ref 13.0–17.0)
MCH: 29.8 pg (ref 26.0–34.0)
MCHC: 33.6 g/dL (ref 30.0–36.0)
MCV: 88.8 fL (ref 78.0–100.0)
PLATELETS: 240 10*3/uL (ref 150–400)
RBC: 4.93 MIL/uL (ref 4.22–5.81)
RDW: 14.4 % (ref 11.5–15.5)
WBC: 8.4 10*3/uL (ref 4.0–10.5)

## 2016-07-09 LAB — GLUCOSE, CAPILLARY
GLUCOSE-CAPILLARY: 169 mg/dL — AB (ref 65–99)
GLUCOSE-CAPILLARY: 184 mg/dL — AB (ref 65–99)
Glucose-Capillary: 197 mg/dL — ABNORMAL HIGH (ref 65–99)
Glucose-Capillary: 255 mg/dL — ABNORMAL HIGH (ref 65–99)

## 2016-07-09 LAB — HEPARIN LEVEL (UNFRACTIONATED): HEPARIN UNFRACTIONATED: 0.62 [IU]/mL (ref 0.30–0.70)

## 2016-07-09 LAB — CARBOXYHEMOGLOBIN
Carboxyhemoglobin: 1.4 % (ref 0.5–1.5)
Methemoglobin: 0.7 % (ref 0.0–1.5)
O2 SAT: 60 %
TOTAL HEMOGLOBIN: 14.9 g/dL (ref 13.5–18.0)

## 2016-07-09 MED ORDER — FUROSEMIDE 40 MG PO TABS
40.0000 mg | ORAL_TABLET | Freq: Every day | ORAL | Status: DC
  Administered 2016-07-11 – 2016-07-18 (×8): 40 mg via ORAL
  Filled 2016-07-09 (×8): qty 1

## 2016-07-09 MED ORDER — HYDROCORTISONE 1 % EX CREA
TOPICAL_CREAM | Freq: Two times a day (BID) | CUTANEOUS | Status: DC | PRN
  Administered 2016-07-09: 1 via TOPICAL
  Administered 2016-07-12 – 2016-07-14 (×2): via TOPICAL
  Administered 2016-07-16: 1 via TOPICAL
  Administered 2016-07-18: 02:00:00 via TOPICAL
  Filled 2016-07-09 (×2): qty 28

## 2016-07-09 NOTE — Progress Notes (Addendum)
Patient ID: [name] [name], male   DOB: October 29, 1932, 82 y.o.   MRN: [id number]    Advanced Heart Failure Rounding Note   Subjective:    He remains in atrial fibrillation with HR 80s-100s on amiodarone gtt and heparin gtt.   Remains on milrinone 0.25 mcg. CVP 3-4.   Has 22-beat VT last night. Feels fine today. Resting comfortably.   He developed allergy to Plavix and was started on Brilinta. Long discussion with TAVR team and [facility] regarding anticoagulation and DAPT strategy.    TEE (8/17): EF 30-35%, severe AS, moderate to severe MR (suspect functional MR).   Carotid dopplers > 80% RICA stenosis.  CTA head/neck with >90% (critical) RICA stenosis and [id number] LICA.   RHC/LHC 06/17/16 RA 11 PCWP 25  CO/CI 3.25/1.4   Ost Cx to Prox Cx lesion, 50 %stenosed.  Mid Cx to Dist Cx lesion, 80 %stenosed.  Mid RCA lesion, 40 %stenosed.  Mid LAD lesion, 75 %stenosed.  2nd Diag lesion, 75 %stenosed.  Hemodynamic findings consistent with moderate pulmonary hypertension.  Unable to cross the aortic valve.  PA sat 52%. CO 3.2 L/min.   Objective:   Weight Range:  Vital Signs:   Temp:  [97 F (36.1 C)-98 F (36.7 C)] 97 F (36.1 C) (08/26 1159) Pulse Rate:  [62-90] 71 (08/26 1159) Resp:  [17-30] 21 (08/26 1159) BP: (120-134)/(53-98) 134/61 (08/26 0828) SpO2:  [95 %-99 %] 99 % (08/26 1159) Weight:  [90.1 kg (198 lb 10.2 oz)] 90.1 kg (198 lb 10.2 oz) (08/26 0332) Last BM Date: 07/09/16  Weight change: Filed Weights   07/06/16 0400 07/06/16 0900 07/09/16 0332  Weight: 93 kg (205 lb) 93.7 kg (206 lb 9.1 oz) 90.1 kg (198 lb 10.2 oz)    Intake/Output:   Intake/Output Summary (Last 24 hours) at 07/09/16 1442 Last data filed at 07/09/16 1145  Gross per 24 hour  Intake            844.2 ml  Output             1925 ml  Net          -1080.8 ml     Physical Exam: CVP 4-5 General:  NAD. Lying flat in bed,   HEENT: normal Neck: supple. JVP flat Carotids 2+ bilat; no bruits. No  thyromegaly or nodule noted. Cor: PMI nondisplaced. Irregular ate & rhythm. No S3.  2/6 systolic crescendo-decrescendo murmur with muffling of S2.   Lungs: Decreased in bases on 4 liters  Abdomen: soft, NT, ND, no HSM. No bruits or masses. +BS  Extremities: no cyanosis, clubbing, rash,  R and LLE no edema.  Neuro: alert & orientedx3, cranial nerves grossly intact. moves all 4 extremities w/o difficulty. Affect pleasant Skin: Red rash on back   Telemetry: A fib 80-90s + 22 beat NSVT  Labs: Basic Metabolic Panel:  Recent Labs Lab 07/05/16 0345 07/06/16 0415 07/07/16 0432 07/08/16 0410 07/09/16 0534  NA 135 132* 132* 130* 132*  K 4.7 4.9 4.8 5.3* 3.8  CL 98* 93* 93* 93* 92*  CO2 30 30 31 28 29   GLUCOSE 180* 190* 248* 263* 220*  BUN 12 16 20 18  22*  CREATININE 1.21 1.22 1.18 1.35* 1.53*  CALCIUM 9.4 9.6 9.4 9.8 9.8    Liver Function Tests: No results for input(s): AST, ALT, ALKPHOS, BILITOT, PROT, ALBUMIN in the last 168 hours. No results for input(s): LIPASE, AMYLASE in the last 168 hours. No results for input(s): AMMONIA in the last  168 hours.  CBC:  Recent Labs Lab 07/05/16 0345 07/06/16 0415 07/07/16 0432 07/08/16 0410 07/09/16 0534  WBC 7.2 8.4 7.6 9.8 8.4  HGB 13.6 14.2 14.3 14.8 14.7  HCT 41.3 41.8 42.7 43.9 43.8  MCV 90.2 88.9 89.1 88.7 88.8  PLT 220 236 228 252 240    Cardiac Enzymes: No results for input(s): CKTOTAL, CKMB, CKMBINDEX, TROPONINI in the last 168 hours.  BNP: BNP (last 3 results)  Recent Labs  05/03/16 1305 05/28/16 1617 06/28/16 1316  BNP 232.0* 294.0* 222.0*    ProBNP (last 3 results) No results for input(s): PROBNP in the last 8760 hours.    Other results:  Imaging: No results found.   Medications:     Scheduled Medications: . amiodarone  200 mg Oral BID  . aspirin  81 mg Oral Daily  . chlorhexidine  15 mL Mouth Rinse BID  . digoxin  0.125 mg Oral Daily  . docusate sodium  100 mg Oral BID  . furosemide  40 mg  Oral BID  . insulin aspart  0-9 Units Subcutaneous TID WC  . mouth rinse  15 mL Mouth Rinse q12n4p  . rosuvastatin  5 mg Oral q1800  . sodium chloride flush  10-40 mL Intracatheter Q12H  . sodium chloride flush  3 mL Intravenous Q12H  . ticagrelor  90 mg Oral BID  . traZODone  100 mg Oral QHS    Infusions: . heparin 1,300 Units/hr (07/09/16 0700)  . milrinone 0.25 mcg/kg/min (07/09/16 1145)    PRN Medications: sodium chloride, acetaminophen **OR** acetaminophen, bisacodyl, diphenhydrAMINE, HYDROcodone-acetaminophen, nortriptyline, ondansetron **OR** ondansetron (ZOFRAN) IV, sodium chloride flush, sodium chloride flush   Assessment:   1. Cardiogenic Shock- TEE with EF 30-35% 2. Acute Respiratory Failure 3. A fib RVR 4. CAD -  2 vessel disease 5. Valvular Heart Disease-  Severe AS/moderate-severe MR (MR may be functional).  6. Caroltid Stenosis- Severe carotid stenosis RICA >80% stenosis by carotid dopplers, CTA head/neck with critical >90% RICA stenosis an [id number] LICA.  7. Bilateral Effusions  8. Nasopharyngeal mass 9. Rash- 10. Hyperkalemia   Plan/Discussion:    CVP 4-5. Co-ox 60% on milrinone 0.25. Creatinine up slightly. Will change lasix to 40 daily   Remains in A fib. HF 80s. Continue amio 200 mg twice a daily. Continue heparin drip.  Check mag with NSVT.   2 vessel CAD, on ASA 81 and added low dose Crestor (prior myalgias with simvastatin).   No BB with low output. May be able to start low dose ACEI soon if creatinine remains stable.   He is too high risk for conventional surgery (low gradient severe AS and moderate to severe probably functional MR).  Plan carotid stent on Monday and TAVR one week after stent.     Will continue brilinta with understanding he will not be candidate for open sternotomy if problems arises with TAVR. 6 weeks after stent with switch ASA/Brilinta to Eliquis/ASA.    PT recommending HH.    Length of Stay: 11  [name] Bickers  MD 07/09/2016, 2:42 PM

## 2016-07-09 NOTE — Progress Notes (Signed)
ANTICOAGULATION CONSULT NOTE - Follow Up Consult  Pharmacy Consult for Heparin Indication: atrial fibrillation  Allergies  Allergen Reactions  . Ambien [Zolpidem Tartrate] Other (See Comments)    Sleep walks  . Cholestatin   . Lipitor [Atorvastatin Calcium] Other (See Comments)    myalgias  . Ranitidine Other (See Comments)    Chest discomfort  . Simvastatin Other (See Comments)    Myalgias  . Xanax Xr [Alprazolam Er]     Tightness in chest    Patient Measurements: Height: 6\' 1"  (185.4 cm) Weight: 198 lb 10.2 oz (90.1 kg) IBW/kg (Calculated) : 79.9  Vital Signs: Temp: 97 F (36.1 C) (08/26 1159) Temp Source: Axillary (08/26 1159) BP: 134/61 (08/26 0828) Pulse Rate: 71 (08/26 1159)  Labs:  Recent Labs  07/07/16 0428  07/07/16 0432 07/08/16 0410 07/09/16 0534 07/09/16 0535  HGB  --   < > 14.3 14.8 14.7  --   HCT  --   --  42.7 43.9 43.8  --   PLT  --   --  228 252 240  --   HEPARINUNFRC 0.58  --   --  0.70  --  0.62  CREATININE  --   --  1.18 1.35* 1.53*  --   < > = values in this interval not displayed.  Estimated Creatinine Clearance: 42.1 mL/min (by C-G formula based on SCr of 1.53 mg/dL).   Assessment: 83yom on heparin for afib (while coumadin on hold) in the setting of cardiogenic shock.   Heparin level remains therapeutic at 0.6on heparin drip 1300 uts/hr   CBC stable. No bleeding reported. Noted plan for carotid stent monday and then TAVR the following week.  Rash on back and arm: most likely from plavix that was started on 8/22. Started diphenhydramine and steroid and switched to ticagrelor. There are some limited reports of cross reactivity with prasugrel but not studies looking at ticagrelor.  Goal of Therapy:  Heparin level 0.3-0.7 units/ml Monitor platelets by anticoagulation protocol: Yes   Plan:  1) Reduce heparin to 1300 units/hr 2) Daily heparin level, CBC 3) Follow up rash  Bonnita Nasuti Pharm.D. CPP, BCPS Clinical  Pharmacist [phone] 07/09/2016 2:52 PM

## 2016-07-10 LAB — CARBOXYHEMOGLOBIN
CARBOXYHEMOGLOBIN: 1.6 % — AB (ref 0.5–1.5)
METHEMOGLOBIN: 0.6 % (ref 0.0–1.5)
O2 Saturation: 70.9 %
Total hemoglobin: 14.8 g/dL (ref 13.5–18.0)

## 2016-07-10 LAB — BASIC METABOLIC PANEL
ANION GAP: 10 (ref 5–15)
BUN: 24 mg/dL — ABNORMAL HIGH (ref 6–20)
CALCIUM: 9.4 mg/dL (ref 8.9–10.3)
CO2: 27 mmol/L (ref 22–32)
CREATININE: 1.37 mg/dL — AB (ref 0.61–1.24)
Chloride: 94 mmol/L — ABNORMAL LOW (ref 101–111)
GFR, EST AFRICAN AMERICAN: 54 mL/min — AB (ref >60)
GFR, EST NON AFRICAN AMERICAN: 46 mL/min — AB (ref >60)
Glucose, Bld: 193 mg/dL — ABNORMAL HIGH (ref 65–99)
Potassium: 4 mmol/L (ref 3.5–5.1)
SODIUM: 131 mmol/L — AB (ref 135–145)

## 2016-07-10 LAB — CBC
HEMATOCRIT: 42.6 % (ref 39.0–52.0)
Hemoglobin: 14.6 g/dL (ref 13.0–17.0)
MCH: 30.2 pg (ref 26.0–34.0)
MCHC: 34.3 g/dL (ref 30.0–36.0)
MCV: 88 fL (ref 78.0–100.0)
PLATELETS: 220 10*3/uL (ref 150–400)
RBC: 4.84 MIL/uL (ref 4.22–5.81)
RDW: 14.3 % (ref 11.5–15.5)
WBC: 7.6 10*3/uL (ref 4.0–10.5)

## 2016-07-10 LAB — GLUCOSE, CAPILLARY
GLUCOSE-CAPILLARY: 201 mg/dL — AB (ref 65–99)
GLUCOSE-CAPILLARY: 222 mg/dL — AB (ref 65–99)
GLUCOSE-CAPILLARY: 182 mg/dL — AB (ref 65–99)
Glucose-Capillary: 192 mg/dL — ABNORMAL HIGH (ref 65–99)

## 2016-07-10 LAB — MAGNESIUM: MAGNESIUM: 2 mg/dL (ref 1.7–2.4)

## 2016-07-10 LAB — HEPARIN LEVEL (UNFRACTIONATED): Heparin Unfractionated: 0.54 IU/mL (ref 0.30–0.70)

## 2016-07-10 NOTE — Progress Notes (Signed)
Patient ID: [name] [name], male   DOB: [birth year], 82 y.o.   MRN: [id number]    Advanced Heart Failure Rounding Note   Subjective:    He remains in atrial fibrillation with HR 80s-100s on amiodarone gtt and heparin gtt.   Remains on milrinone 0.25 mcg. CVP 5-6   Feels fine today. Resting comfortably. Walked entire unit 2x yesterday.   He developed allergy to Plavix and was started on Brilinta. Long discussion with TAVR team and [facility] regarding anticoagulation and DAPT strategy. Initially plan was for Brilinta but d/w [name] today and [facility] will plan to do carotid stent of Brilinta and on ASA only. Continue heparin and ASA/    TEE (8/17): EF 30-35%, severe AS, moderate to severe MR (suspect functional MR).   Carotid dopplers > 80% RICA stenosis.  CTA head/neck with >90% (critical) RICA stenosis and [id number] LICA.   RHC/LHC 06/17/16 RA 11 PCWP 25  CO/CI 3.25/1.4   Ost Cx to Prox Cx lesion, 50 %stenosed.  Mid Cx to Dist Cx lesion, 80 %stenosed.  Mid RCA lesion, 40 %stenosed.  Mid LAD lesion, 75 %stenosed.  2nd Diag lesion, 75 %stenosed.  Hemodynamic findings consistent with moderate pulmonary hypertension.  Unable to cross the aortic valve.  PA sat 52%. CO 3.2 L/min.   Objective:   Weight Range:  Vital Signs:   Temp:  [97.3 F (36.3 C)-98.1 F (36.7 C)] 97.3 F (36.3 C) (08/27 1243) Pulse Rate:  [48-96] 96 (08/27 1200) Resp:  [17-26] 18 (08/27 1200) BP: (96-125)/(55-86) 99/60 (08/27 1200) SpO2:  [97 %-100 %] 99 % (08/27 1200) Weight:  [90.9 kg (200 lb 8 oz)-91 kg (200 lb 9.9 oz)] 90.9 kg (200 lb 8 oz) (08/27 0500) Last BM Date: 07/09/16  Weight change: Filed Weights   07/09/16 0332 07/10/16 0345 07/10/16 0500  Weight: 90.1 kg (198 lb 10.2 oz) 91 kg (200 lb 9.9 oz) 90.9 kg (200 lb 8 oz)    Intake/Output:   Intake/Output Summary (Last 24 hours) at 07/10/16 1516 Last data filed at 07/10/16 1400  Gross per 24 hour  Intake            764.6 ml  Output              1550 ml  Net           -785.4 ml     Physical Exam: CVP 5-6 General:  NAD. Lying flat in bed,   HEENT: normal Neck: supple. JVP flat Carotids 2+ bilat; no bruits. No thyromegaly or nodule noted. Cor: PMI nondisplaced. Irregular ate & rhythm. No S3.  2/6 systolic crescendo-decrescendo murmur with muffling of S2.   Lungs: clears  Abdomen: soft, NT, ND, no HSM. No bruits or masses. +BS  Extremities: no cyanosis, clubbing, rash,no edema.  Neuro: alert & orientedx3, cranial nerves grossly intact. moves all 4 extremities w/o difficulty. Affect pleasant Skin: Red rash on back   Telemetry: A fib 80-90s   Labs: Basic Metabolic Panel:  Recent Labs Lab 07/06/16 0415 07/07/16 0432 07/08/16 0410 07/09/16 0534 07/10/16 0518  NA 132* 132* 130* 132* 131*  K 4.9 4.8 5.3* 3.8 4.0  CL 93* 93* 93* 92* 94*  CO2 30 31 28 29 27   GLUCOSE 190* 248* 263* 220* 193*  BUN 16 20 18  22* 24*  CREATININE 1.22 1.18 1.35* 1.53* 1.37*  CALCIUM 9.6 9.4 9.8 9.8 9.4  MG  --   --   --   --  2.0  Liver Function Tests: No results for input(s): AST, ALT, ALKPHOS, BILITOT, PROT, ALBUMIN in the last 168 hours. No results for input(s): LIPASE, AMYLASE in the last 168 hours. No results for input(s): AMMONIA in the last 168 hours.  CBC:  Recent Labs Lab 07/06/16 0415 07/07/16 0432 07/08/16 0410 07/09/16 0534 07/10/16 0518  WBC 8.4 7.6 9.8 8.4 7.6  HGB 14.2 14.3 14.8 14.7 14.6  HCT 41.8 42.7 43.9 43.8 42.6  MCV 88.9 89.1 88.7 88.8 88.0  PLT 236 228 252 240 220    Cardiac Enzymes: No results for input(s): CKTOTAL, CKMB, CKMBINDEX, TROPONINI in the last 168 hours.  BNP: BNP (last 3 results)  Recent Labs  05/03/16 1305 05/28/16 1617 06/28/16 1316  BNP 232.0* 294.0* 222.0*    ProBNP (last 3 results) No results for input(s): PROBNP in the last 8760 hours.    Other results:  Imaging: No results found.   Medications:     Scheduled Medications: . amiodarone  200 mg Oral BID  .  aspirin  81 mg Oral Daily  . chlorhexidine  15 mL Mouth Rinse BID  . digoxin  0.125 mg Oral Daily  . docusate sodium  100 mg Oral BID  . [START ON 07/11/2016] furosemide  40 mg Oral Daily  . insulin aspart  0-9 Units Subcutaneous TID WC  . mouth rinse  15 mL Mouth Rinse q12n4p  . rosuvastatin  5 mg Oral q1800  . sodium chloride flush  10-40 mL Intracatheter Q12H  . sodium chloride flush  3 mL Intravenous Q12H  . traZODone  100 mg Oral QHS    Infusions: . heparin 1,300 Units/hr (07/10/16 1400)  . milrinone 0.25 mcg/kg/min (07/10/16 1400)    PRN Medications: sodium chloride, acetaminophen **OR** acetaminophen, bisacodyl, diphenhydrAMINE, HYDROcodone-acetaminophen, hydrocortisone cream, nortriptyline, ondansetron **OR** ondansetron (ZOFRAN) IV, sodium chloride flush, sodium chloride flush   Assessment:   1. Cardiogenic Shock- TEE with EF 30-35% 2. Acute Respiratory Failure 3. A fib RVR 4. CAD -  2 vessel disease 5. Valvular Heart Disease-  Severe AS/moderate-severe MR (MR may be functional).  6. Caroltid Stenosis- Severe carotid stenosis RICA >80% stenosis by carotid dopplers, CTA head/neck with critical >90% RICA stenosis an [id number] LICA.  7. Bilateral Effusions  8. Nasopharyngeal mass 9. Rash- 10. Hyperkalemia   Plan/Discussion:    CVP 5-6. Co-ox 71% on milrinone 0.25. Volume status and creatinine stable on po lasix 40 daily   Remains in A fib. HF 80s. Continue amio 200 mg twice a daily. Continue heparin drip.  NSVT quiesccent today. k and Mag ok.   2 vessel CAD, on ASA 81 and added low dose Crestor (prior myalgias with simvastatin).   No BB with low output. May be able to start low dose ACEI soon if creatinine remains stable. Will re-evaluate after cath tomorrow (don't start prior to dye load)  He is too high risk for conventional surgery (low gradient severe AS and moderate to severe probably functional MR).  Plan carotid stent tomorrow and TAVR one week after stent.       Will continue ASA for carotid stent. Will evaluate for ENVISAGE TAVI-AF trial comparing coumadin vs Sayvasa pot-TAVR.   PT recommending HH.    Length of Stay: 12  [name] MD 07/10/2016, 3:16 PM

## 2016-07-10 NOTE — Progress Notes (Signed)
ANTICOAGULATION CONSULT NOTE - Follow Up Consult  Pharmacy Consult for Heparin Indication: atrial fibrillation  Allergies  Allergen Reactions  . Ambien [Zolpidem Tartrate] Other (See Comments)    Sleep walks  . Cholestatin   . Lipitor [Atorvastatin Calcium] Other (See Comments)    myalgias  . Ranitidine Other (See Comments)    Chest discomfort  . Simvastatin Other (See Comments)    Myalgias  . Xanax Xr [Alprazolam Er]     Tightness in chest  . Clopidogrel Bisulfate Rash    Patient Measurements: Height: 6\' 1"  (185.4 cm) Weight: 200 lb 8 oz (90.9 kg) IBW/kg (Calculated) : 79.9  Vital Signs: Temp: 97.3 F (36.3 C) (08/27 1243) Temp Source: Oral (08/27 1243) BP: 99/60 (08/27 1200) Pulse Rate: 96 (08/27 1200)  Labs:  Recent Labs  07/08/16 0410 07/09/16 0534 07/09/16 0535 07/10/16 0518 07/10/16 0519  HGB 14.8 14.7  --  14.6  --   HCT 43.9 43.8  --  42.6  --   PLT 252 240  --  220  --   HEPARINUNFRC 0.70  --  0.62  --  0.54  CREATININE 1.35* 1.53*  --  1.37*  --     Estimated Creatinine Clearance: 47 mL/min (by C-G formula based on SCr of 1.37 mg/dL).   Assessment: 83yom on heparin for afib (while coumadin on hold) in the setting of cardiogenic shock.   Heparin level remains therapeutic at 0.5 on heparin drip 1300 uts/hr   CBC stable. No bleeding reported. Noted plan for carotid stent monday and then TAVR the following week.    Goal of Therapy:  Heparin level 0.3-0.7 units/ml Monitor platelets by anticoagulation protocol: Yes   Plan:  1)  heparin to 1300 units/hr 2) Daily heparin level, CBC   Bonnita Nasuti Pharm.D. CPP, BCPS Clinical Pharmacist [phone] 07/10/2016 2:02 PM

## 2016-07-11 ENCOUNTER — Other Ambulatory Visit: Payer: Self-pay | Admitting: *Deleted

## 2016-07-11 ENCOUNTER — Encounter (HOSPITAL_COMMUNITY)
Admission: EM | Disposition: A | Discharge status: Skilled Nursing Facility | Payer: Self-pay | Source: Home / Self Care | Attending: Internal Medicine

## 2016-07-11 ENCOUNTER — Encounter (HOSPITAL_COMMUNITY): Payer: Self-pay | Admitting: Cardiovascular Disease

## 2016-07-11 DIAGNOSIS — I6521 Occlusion and stenosis of right carotid artery: Secondary | ICD-10-CM

## 2016-07-11 DIAGNOSIS — R319 Hematuria, unspecified: Secondary | ICD-10-CM

## 2016-07-11 DIAGNOSIS — Z48812 Encounter for surgical aftercare following surgery on the circulatory system: Secondary | ICD-10-CM

## 2016-07-11 DIAGNOSIS — I6523 Occlusion and stenosis of bilateral carotid arteries: Secondary | ICD-10-CM

## 2016-07-11 HISTORY — PX: PERIPHERAL VASCULAR CATHETERIZATION: SHX172C

## 2016-07-11 LAB — CBC
HEMATOCRIT: 41.3 % (ref 39.0–52.0)
Hemoglobin: 13.8 g/dL (ref 13.0–17.0)
MCH: 29.8 pg (ref 26.0–34.0)
MCHC: 33.4 g/dL (ref 30.0–36.0)
MCV: 89.2 fL (ref 78.0–100.0)
Platelets: 207 10*3/uL (ref 150–400)
RBC: 4.63 MIL/uL (ref 4.22–5.81)
RDW: 14.2 % (ref 11.5–15.5)
WBC: 6.8 10*3/uL (ref 4.0–10.5)

## 2016-07-11 LAB — GLUCOSE, CAPILLARY
GLUCOSE-CAPILLARY: 185 mg/dL — AB (ref 65–99)
GLUCOSE-CAPILLARY: 178 mg/dL — AB (ref 65–99)
GLUCOSE-CAPILLARY: 177 mg/dL — AB (ref 65–99)
Glucose-Capillary: 181 mg/dL — ABNORMAL HIGH (ref 65–99)

## 2016-07-11 LAB — URINALYSIS W MICROSCOPIC (NOT AT ARMC)
GLUCOSE, UA: NEGATIVE mg/dL
Ketones, ur: 15 mg/dL — AB
Nitrite: POSITIVE — AB
PH: 5 (ref 5.0–8.0)
Protein, ur: >300 mg/dL — AB
SPECIFIC GRAVITY, URINE: 1.018 (ref 1.005–1.030)

## 2016-07-11 LAB — BASIC METABOLIC PANEL
Anion gap: 9 (ref 5–15)
BUN: 20 mg/dL (ref 6–20)
CALCIUM: 9.3 mg/dL (ref 8.9–10.3)
CO2: 28 mmol/L (ref 22–32)
CREATININE: 1.25 mg/dL — AB (ref 0.61–1.24)
Chloride: 93 mmol/L — ABNORMAL LOW (ref 101–111)
GFR, EST NON AFRICAN AMERICAN: 52 mL/min — AB (ref >60)
GLUCOSE: 241 mg/dL — AB (ref 65–99)
Potassium: 3.9 mmol/L (ref 3.5–5.1)
Sodium: 130 mmol/L — ABNORMAL LOW (ref 135–145)

## 2016-07-11 LAB — CARBOXYHEMOGLOBIN
CARBOXYHEMOGLOBIN: 1.9 % — AB (ref 0.5–1.5)
METHEMOGLOBIN: 0.6 % (ref 0.0–1.5)
O2 SAT: 81.2 %
Total hemoglobin: 13.3 g/dL — ABNORMAL LOW (ref 13.5–18.0)

## 2016-07-11 LAB — POCT ACTIVATED CLOTTING TIME
Activated Clotting Time: 422 seconds
Activated Clotting Time: 164 seconds

## 2016-07-11 SURGERY — CAROTID PTA/STENT INTERVENTION

## 2016-07-11 MED ORDER — IODIXANOL 320 MG/ML IV SOLN
INTRAVENOUS | Status: DC | PRN
  Administered 2016-07-11: 170 mL via INTRAVENOUS

## 2016-07-11 MED ORDER — CIPROFLOXACIN HCL 500 MG PO TABS
500.0000 mg | ORAL_TABLET | Freq: Two times a day (BID) | ORAL | Status: AC
  Administered 2016-07-11 – 2016-07-17 (×13): 500 mg via ORAL
  Filled 2016-07-11 (×15): qty 1

## 2016-07-11 MED ORDER — LIDOCAINE HCL (PF) 1 % IJ SOLN
INTRAMUSCULAR | Status: DC | PRN
  Administered 2016-07-11: 10 mL via SUBCUTANEOUS

## 2016-07-11 MED ORDER — HEPARIN (PORCINE) IN NACL 2-0.9 UNIT/ML-% IJ SOLN
INTRAMUSCULAR | Status: AC
  Filled 2016-07-11: qty 1000

## 2016-07-11 MED ORDER — ONDANSETRON HCL 4 MG/2ML IJ SOLN: 4.0000 mg | Freq: Four times a day (QID) | INTRAMUSCULAR | Status: DC | PRN

## 2016-07-11 MED ORDER — BIVALIRUDIN 250 MG IV SOLR
INTRAVENOUS | Status: AC
  Filled 2016-07-11: qty 250

## 2016-07-11 MED ORDER — LIDOCAINE HCL (PF) 1 % IJ SOLN
INTRAMUSCULAR | Status: AC
  Filled 2016-07-11: qty 30

## 2016-07-11 MED ORDER — BIVALIRUDIN BOLUS VIA INFUSION - CUPID
INTRAVENOUS | Status: DC | PRN
  Administered 2016-07-11: 68.55 mg via INTRAVENOUS

## 2016-07-11 MED ORDER — ATROPINE SULFATE 1 MG/10ML IJ SOSY
PREFILLED_SYRINGE | INTRAMUSCULAR | Status: AC
  Filled 2016-07-11: qty 10

## 2016-07-11 MED ORDER — NOREPINEPHRINE BITARTRATE 1 MG/ML IV SOLN
INTRAVENOUS | Status: AC
  Filled 2016-07-11: qty 4

## 2016-07-11 MED ORDER — SODIUM CHLORIDE 0.9 % IV SOLN: INTRAVENOUS | Status: AC

## 2016-07-11 MED ORDER — HEPARIN (PORCINE) IN NACL 100-0.45 UNIT/ML-% IJ SOLN: 1000.0000 [IU]/h | INTRAMUSCULAR | Status: DC

## 2016-07-11 MED ORDER — SODIUM CHLORIDE 0.9 % IV SOLN
INTRAVENOUS | Status: DC | PRN
  Administered 2016-07-11: 1.75 mg/kg/h via INTRAVENOUS

## 2016-07-11 MED ORDER — ATROPINE SULFATE 1 MG/10ML IJ SOSY
PREFILLED_SYRINGE | INTRAMUSCULAR | Status: DC | PRN
  Administered 2016-07-11 (×2): 0.5 mg via INTRAVENOUS

## 2016-07-11 MED ORDER — MORPHINE SULFATE (PF) 2 MG/ML IV SOLN
2.0000 mg | INTRAVENOUS | Status: DC | PRN
  Administered 2016-07-11 – 2016-07-14 (×5): 2 mg via INTRAVENOUS
  Filled 2016-07-11 (×5): qty 1

## 2016-07-11 MED ORDER — ACETAMINOPHEN 325 MG PO TABS: 650.0000 mg | ORAL_TABLET | ORAL | Status: DC | PRN

## 2016-07-11 MED ORDER — HEPARIN (PORCINE) IN NACL 2-0.9 UNIT/ML-% IJ SOLN
INTRAMUSCULAR | Status: DC | PRN
  Administered 2016-07-11: 1000 mL

## 2016-07-11 SURGICAL SUPPLY — 20 items
BALLN EMERGE MR 3.0X20 (BALLOONS) ×3
BALLN VIATRAC 5X20X135 (BALLOONS) ×3
BALLOON EMERGE MR 3.0X20 (BALLOONS) IMPLANT
BALLOON VIATRAC 5X20X135 (BALLOONS) IMPLANT
CATH ANGIO 5F JB1 100CM (CATHETERS) ×2 IMPLANT
CATH ANGIO 5F PIGTAIL 100CM (CATHETERS) ×2 IMPLANT
COVER PRB 48X5XTLSCP FOLD TPE (BAG) IMPLANT
COVER PROBE 5X48 (BAG) ×3
DEVICE EMBOSHIELD NAV6 4.0-7.0 (WIRE) ×2 IMPLANT
KIT ENCORE 26 ADVANTAGE (KITS) ×2 IMPLANT
KIT PV (KITS) ×3 IMPLANT
SHEATH PINNACLE 5F 10CM (SHEATH) ×2 IMPLANT
SHEATH PINNACLE 6F 10CM (SHEATH) ×2 IMPLANT
SHEATH SHUTTLE SELECT 6F (SHEATH) ×2 IMPLANT
STENT XACT CAR 9-7X40X136 (Stent) ×2 IMPLANT
SYR MEDRAD MARK V 150ML (SYRINGE) ×3 IMPLANT
TRANSDUCER W/STOPCOCK (MISCELLANEOUS) ×3 IMPLANT
TRAY PV CATH (CUSTOM PROCEDURE TRAY) ×3 IMPLANT
WIRE AMPLATZ SSTIFF .035X260CM (WIRE) ×2 IMPLANT
WIRE HITORQ VERSACORE ST 145CM (WIRE) ×2 IMPLANT

## 2016-07-11 NOTE — Progress Notes (Signed)
PT Cancellation Note  Patient Details Name: [name] [name] MRN: [id number] DOB: 1932/06/15   Cancelled Treatment:    Reason Eval/Treat Not Completed: Patient at procedure or test/unavailable (Pt for carotid surgery with possible stent today. Will f/u at later date.)  Thanks.   Irwin Brakeman F 07/11/2016, 9:00 AM [facility] [phone] (pager)

## 2016-07-11 NOTE — H&P (View-Only) (Signed)
   VASCULAR SURGERY ASSESSMENT & PLAN:  For right carotid stent Monday by Dr Oneida Alar. This is for a >80% right carotid stenosis which is likely symptomatic. He is on Plavix.  His crt is 1.35 which is up slightly. Given his CHF will leave hydration over the weekend to primary service.   SUBJECTIVE: No complaints  PHYSICAL EXAM: Vitals:   07/07/16 2044 07/08/16 0000 07/08/16 0400 07/08/16 0820  BP:   (!) 125/56 128/69  Pulse: 81  95 80  Resp: (!) 30  19 12   Temp: 97.5 F (36.4 C) 98.1 F (36.7 C) 98.1 F (36.7 C) 97.5 F (36.4 C)  TempSrc: Oral Oral Oral Oral  SpO2: 93%  93% 91%  Weight:      Height:       Palpable right femoral pulse.   LABS: Lab Results  Component Value Date   WBC 9.8 07/08/2016   HGB 14.8 07/08/2016   HCT 43.9 07/08/2016   MCV 88.7 07/08/2016   PLT 252 07/08/2016   Lab Results  Component Value Date   CREATININE 1.35 (H) 07/08/2016   Lab Results  Component Value Date   INR 1.53 06/30/2016   CBG (last 3)   Recent Labs  07/07/16 1129 07/07/16 1606 07/07/16 2110  GLUCAP 209* 241* 252*    Principal Problem:   Cardiogenic shock (HCC) Active Problems:   Hypertension   Cerebrovascular disease   Atrial fibrillation (HCC)   Hyperlipidemia   Aortic stenosis   Diabetes mellitus ([place])   Carotid stenosis   Anemia of chronic disease   Systolic congestive heart failure (HCC)   Dyspnea   CAD (coronary artery disease), native coronary artery   Acute on chronic combined systolic and diastolic CHF, NYHA class 4 ([place])   Acute respiratory failure with hypoxia ([place])    Gae Gallop [id number] 07/08/2016

## 2016-07-11 NOTE — Progress Notes (Signed)
Right femoral sheath removed without complications.  Manual pressure held for 20 minutes.  Right femoral level 0, right DP and PT pulses 1+.  Vital signs remained stable throughout procedure.  Patient educated on keeping right leg straight, not using right leg until bedrest is completed, ending at 10 pm. Also educated on applying manual pressure to site if he needs to cough or sneeze.  Will continue to monitor pt closely.

## 2016-07-11 NOTE — Interval H&P Note (Signed)
History and Physical Interval Note:  07/11/2016 7:47 AM  [name] [name]  has presented today for surgery, with the diagnosis of carotid stenosis  The various methods of treatment have been discussed with the patient and family. After consideration of risks, benefits and other options for treatment, the patient has consented to  Procedure(s): Carotid PTA/Stent Intervention (N/A) as a surgical intervention .  The patient's history has been reviewed, patient examined, no change in status, stable for surgery.  I have reviewed the patient's chart and labs.  Questions were answered to the patient's satisfaction.     Procedure details of carotid stenting risks and benefits discussed with pt.  Complications including but not limited to bleeding 1% infection <1% stroke 5%  myocardial events 5-10% discussed with pt and he agrees to proceed.   Ruta Hinds

## 2016-07-11 NOTE — Progress Notes (Signed)
[facility]  Subjective  - feels ok   Objective (!) 100/58 95 (!) 96.5 F (35.8 C) (Axillary) (!) 24 100%  Intake/Output Summary (Last 24 hours) at 07/11/16 1240 Last data filed at 07/11/16 1100  Gross per 24 hour  Intake           461.17 ml  Output             1025 ml  Net          -563.83 ml   Right groin sheath still in place, no hematoma Neuro: symmetric ue/le 5/5/ motor no facial assymetry or slurred speech  Assessment/Planning: Heparin IV later today after sheath pull Plan is for NOAC or other anticoagulation for afib as well as ASA post Shirley Friar 07/11/2016 12:40 PM --  Laboratory Lab Results:  Recent Labs  07/10/16 0518 07/11/16 0412  WBC 7.6 6.8  HGB 14.6 13.8  HCT 42.6 41.3  PLT 220 207   BMET  Recent Labs  07/10/16 0518 07/11/16 0412  NA 131* 130*  K 4.0 3.9  CL 94* 93*  CO2 27 28  GLUCOSE 193* 241*  BUN 24* 20  CREATININE 1.37* 1.25*  CALCIUM 9.4 9.3    COAG Lab Results  Component Value Date   INR 1.53 06/30/2016   INR 1.72 06/29/2016   INR 1.66 06/28/2016   No results found for: PTT

## 2016-07-11 NOTE — H&P (View-Only) (Signed)
Patient ID: [name] [name], male   DOB: 18-Apr-1932, 82 y.o.   MRN: [id number]    Advanced Heart Failure Rounding Note   Subjective:    He remains in atrial fibrillation with HR 80s-100s on amiodarone gtt and heparin gtt.   Remains on milrinone 0.25 mcg. CVP 5-6   Feels fine today. Resting comfortably. Walked entire unit 2x yesterday.   He developed allergy to Plavix and was started on Brilinta. Long discussion with TAVR team and [facility] regarding anticoagulation and DAPT strategy. Initially plan was for Brilinta but d/w [name] today and [facility]VS will plan to do carotid stent of Brilinta and on ASA only. Continue heparin and ASA/    TEE (8/17): EF 30-35%, severe AS, moderate to severe MR (suspect functional MR).   Carotid dopplers > 80% RICA stenosis.  CTA head/neck with >90% (critical) RICA stenosis and [id number] LICA.   RHC/LHC 06/17/16 RA 11 PCWP 25  CO/CI 3.25/1.4   Ost Cx to Prox Cx lesion, 50 %stenosed.  Mid Cx to Dist Cx lesion, 80 %stenosed.  Mid RCA lesion, 40 %stenosed.  Mid LAD lesion, 75 %stenosed.  2nd Diag lesion, 75 %stenosed.  Hemodynamic findings consistent with moderate pulmonary hypertension.  Unable to cross the aortic valve.  PA sat 52%. CO 3.2 L/min.   Objective:   Weight Range:  Vital Signs:   Temp:  [97.3 F (36.3 C)-98.1 F (36.7 C)] 97.3 F (36.3 C) (08/27 1243) Pulse Rate:  [48-96] 96 (08/27 1200) Resp:  [17-26] 18 (08/27 1200) BP: (96-125)/(55-86) 99/60 (08/27 1200) SpO2:  [97 %-100 %] 99 % (08/27 1200) Weight:  [90.9 kg (200 lb 8 oz)-91 kg (200 lb 9.9 oz)] 90.9 kg (200 lb 8 oz) (08/27 0500) Last BM Date: 07/09/16  Weight change: Filed Weights   07/09/16 0332 07/10/16 0345 07/10/16 0500  Weight: 90.1 kg (198 lb 10.2 oz) 91 kg (200 lb 9.9 oz) 90.9 kg (200 lb 8 oz)    Intake/Output:   Intake/Output Summary (Last 24 hours) at 07/10/16 1516 Last data filed at 07/10/16 1400  Gross per 24 hour  Intake            764.6 ml  Output              1550 ml  Net           -785.4 ml     Physical Exam: CVP 5-6 General:  NAD. Lying flat in bed,   HEENT: normal Neck: supple. JVP flat Carotids 2+ bilat; no bruits. No thyromegaly or nodule noted. Cor: PMI nondisplaced. Irregular ate & rhythm. No S3.  2/6 systolic crescendo-decrescendo murmur with muffling of S2.   Lungs: clears  Abdomen: soft, NT, ND, no HSM. No bruits or masses. +BS  Extremities: no cyanosis, clubbing, rash,no edema.  Neuro: alert & orientedx3, cranial nerves grossly intact. moves all 4 extremities w/o difficulty. Affect pleasant Skin: Red rash on back   Telemetry: A fib 80-90s   Labs: Basic Metabolic Panel:  Recent Labs Lab 07/06/16 0415 07/07/16 0432 07/08/16 0410 07/09/16 0534 07/10/16 0518  NA 132* 132* 130* 132* 131*  K 4.9 4.8 5.3* 3.8 4.0  CL 93* 93* 93* 92* 94*  CO2 30 31 28 29 27   GLUCOSE 190* 248* 263* 220* 193*  BUN 16 20 18  22* 24*  CREATININE 1.22 1.18 1.35* 1.53* 1.37*  CALCIUM 9.6 9.4 9.8 9.8 9.4  MG  --   --   --   --  2.0  Liver Function Tests: No results for input(s): AST, ALT, ALKPHOS, BILITOT, PROT, ALBUMIN in the last 168 hours. No results for input(s): LIPASE, AMYLASE in the last 168 hours. No results for input(s): AMMONIA in the last 168 hours.  CBC:  Recent Labs Lab 07/06/16 0415 07/07/16 0432 07/08/16 0410 07/09/16 0534 07/10/16 0518  WBC 8.4 7.6 9.8 8.4 7.6  HGB 14.2 14.3 14.8 14.7 14.6  HCT 41.8 42.7 43.9 43.8 42.6  MCV 88.9 89.1 88.7 88.8 88.0  PLT 236 228 252 240 220    Cardiac Enzymes: No results for input(s): CKTOTAL, CKMB, CKMBINDEX, TROPONINI in the last 168 hours.  BNP: BNP (last 3 results)  Recent Labs  05/03/16 1305 05/28/16 1617 06/28/16 1316  BNP 232.0* 294.0* 222.0*    ProBNP (last 3 results) No results for input(s): PROBNP in the last 8760 hours.    Other results:  Imaging: No results found.   Medications:     Scheduled Medications: . amiodarone  200 mg Oral BID  .  aspirin  81 mg Oral Daily  . chlorhexidine  15 mL Mouth Rinse BID  . digoxin  0.125 mg Oral Daily  . docusate sodium  100 mg Oral BID  . [START ON 07/11/2016] furosemide  40 mg Oral Daily  . insulin aspart  0-9 Units Subcutaneous TID WC  . mouth rinse  15 mL Mouth Rinse q12n4p  . rosuvastatin  5 mg Oral q1800  . sodium chloride flush  10-40 mL Intracatheter Q12H  . sodium chloride flush  3 mL Intravenous Q12H  . traZODone  100 mg Oral QHS    Infusions: . heparin 1,300 Units/hr (07/10/16 1400)  . milrinone 0.25 mcg/kg/min (07/10/16 1400)    PRN Medications: sodium chloride, acetaminophen **OR** acetaminophen, bisacodyl, diphenhydrAMINE, HYDROcodone-acetaminophen, hydrocortisone cream, nortriptyline, ondansetron **OR** ondansetron (ZOFRAN) IV, sodium chloride flush, sodium chloride flush   Assessment:   1. Cardiogenic Shock- TEE with EF 30-35% 2. Acute Respiratory Failure 3. A fib RVR 4. CAD -  2 vessel disease 5. Valvular Heart Disease-  Severe AS/moderate-severe MR (MR may be functional).  6. Caroltid Stenosis- Severe carotid stenosis RICA >80% stenosis by carotid dopplers, CTA head/neck with critical >90% RICA stenosis an [id number] LICA.  7. Bilateral Effusions  8. Nasopharyngeal mass 9. Rash- 10. Hyperkalemia   Plan/Discussion:    CVP 5-6. Co-ox 71% on milrinone 0.25. Volume status and creatinine stable on po lasix 40 daily   Remains in A fib. HF 80s. Continue amio 200 mg twice a daily. Continue heparin drip.  NSVT quiesccent today. k and Mag ok.   2 vessel CAD, on ASA 81 and added low dose Crestor (prior myalgias with simvastatin).   No BB with low output. May be able to start low dose ACEI soon if creatinine remains stable. Will re-evaluate after cath tomorrow (don't start prior to dye load)  He is too high risk for conventional surgery (low gradient severe AS and moderate to severe probably functional MR).  Plan carotid stent tomorrow and TAVR one week after stent.       Will continue ASA for carotid stent. Will evaluate for ENVISAGE TAVI-AF trial comparing coumadin vs Sayvasa pot-TAVR.   PT recommending HH.    Length of Stay: 12  [name] MD 07/10/2016, 3:16 PM

## 2016-07-11 NOTE — Interval H&P Note (Signed)
History and Physical Interval Note:  07/11/2016 8:53 AM  [name] [name]  has presented today for surgery, with the diagnosis of carotid stenosis  The various methods of treatment have been discussed with the patient and family. After consideration of risks, benefits and other options for treatment, the patient has consented to  Procedure(s): Carotid PTA/Stent Intervention (N/A) as a surgical intervention .  The patient's history has been reviewed, patient examined, no change in status, stable for surgery.  I have reviewed the patient's chart and labs.  Questions were answered to the patient's satisfaction.     Quay Burow

## 2016-07-11 NOTE — Progress Notes (Signed)
PT Cancellation Note  Patient Details Name: [name] [name] MRN: [id number] DOB: 10-22-1932   Cancelled Treatment:    Reason Eval/Treat Not Completed: Medical issues which prohibited therapy (Pt had CEA with femoral sheath still in place.  Check tomorrow.)Thanks.    Irwin Brakeman F 07/11/2016, 2:46 PM [facility] [facility] [phone] (pager)

## 2016-07-11 NOTE — Progress Notes (Addendum)
Patient ID: [name] [name], male   DOB: 1932/11/07, 82 y.o.   MRN: [id number]    Advanced Heart Failure Rounding Note   Subjective:    Last night developed frank hematuria. Heparin stopped. UA and urine cytology sent. UA + for blood and WBCs.   Remains on milrinone 0.25 mcg. CVP 5-6. He remains in atrial fibrillation with HR 80s-100s on amiodarone gtt  Underwent successful right carotid stent today. No complaints. Breathing ok.    TEE (8/17): EF 30-35%, severe AS, moderate to severe MR (suspect functional MR).   Carotid dopplers > 80% RICA stenosis.  CTA head/neck with >90% (critical) RICA stenosis and [id number] LICA.   RHC/LHC 06/17/16 RA 11 PCWP 25  CO/CI 3.25/1.4   Ost Cx to Prox Cx lesion, 50 %stenosed.  Mid Cx to Dist Cx lesion, 80 %stenosed.  Mid RCA lesion, 40 %stenosed.  Mid LAD lesion, 75 %stenosed.  2nd Diag lesion, 75 %stenosed.  Hemodynamic findings consistent with moderate pulmonary hypertension.  Unable to cross the aortic valve.  PA sat 52%. CO 3.2 L/min.   Objective:   Weight Range:  Vital Signs:   Temp:  [97.3 F (36.3 C)-98.4 F (36.9 C)] 98.4 F (36.9 C) (08/28 0401) Pulse Rate:  [51-120] 95 (08/28 1100) Resp:  [10-29] 24 (08/28 1100) BP: (91-137)/(40-90) 100/58 (08/28 1000) SpO2:  [93 %-100 %] 100 % (08/28 1100) Arterial Line BP: (102-133)/(46-54) 116/49 (08/28 1100) Weight:  [91.4 kg (201 lb 9.6 oz)] 91.4 kg (201 lb 9.6 oz) (08/28 0401) Last BM Date: 07/10/16  Weight change: Filed Weights   07/10/16 0345 07/10/16 0500 07/11/16 0401  Weight: 91 kg (200 lb 9.9 oz) 90.9 kg (200 lb 8 oz) 91.4 kg (201 lb 9.6 oz)    Intake/Output:   Intake/Output Summary (Last 24 hours) at 07/11/16 1112 Last data filed at 07/11/16 0700  Gross per 24 hour  Intake            502.4 ml  Output             1375 ml  Net           -872.6 ml     Physical Exam: CVP 5-6 General:  NAD. Lying flat in bed,   HEENT: normal Neck: supple. JVP flat Carotids 2+ bilat;  no bruits. No thyromegaly or nodule noted. Cor: PMI nondisplaced. Irregular ate & rhythm. No S3.  2/6 systolic crescendo-decrescendo murmur with muffling of S2.   Lungs: clears  Abdomen: soft, NT, ND, no HSM. No bruits or masses. +BS  Extremities: no cyanosis, clubbing, rash,no edema. R groin site ok.  Neuro: alert & orientedx3, cranial nerves grossly intact. moves all 4 extremities w/o difficulty. Affect pleasant Skin: Red rash on back   Telemetry: A fib 80-90s   Labs: Basic Metabolic Panel:  Recent Labs Lab 07/07/16 0432 07/08/16 0410 07/09/16 0534 07/10/16 0518 07/11/16 0412  NA 132* 130* 132* 131* 130*  K 4.8 5.3* 3.8 4.0 3.9  CL 93* 93* 92* 94* 93*  CO2 31 28 29 27 28   GLUCOSE 248* 263* 220* 193* 241*  BUN 20 18 22* 24* 20  CREATININE 1.18 1.35* 1.53* 1.37* 1.25*  CALCIUM 9.4 9.8 9.8 9.4 9.3  MG  --   --   --  2.0  --     Liver Function Tests: No results for input(s): AST, ALT, ALKPHOS, BILITOT, PROT, ALBUMIN in the last 168 hours. No results for input(s): LIPASE, AMYLASE in the last 168 hours. No results  for input(s): AMMONIA in the last 168 hours.  CBC:  Recent Labs Lab 07/07/16 0432 07/08/16 0410 07/09/16 0534 07/10/16 0518 07/11/16 0412  WBC 7.6 9.8 8.4 7.6 6.8  HGB 14.3 14.8 14.7 14.6 13.8  HCT 42.7 43.9 43.8 42.6 41.3  MCV 89.1 88.7 88.8 88.0 89.2  PLT 228 252 240 220 207    Cardiac Enzymes: No results for input(s): CKTOTAL, CKMB, CKMBINDEX, TROPONINI in the last 168 hours.  BNP: BNP (last 3 results)  Recent Labs  05/03/16 1305 05/28/16 1617 06/28/16 1316  BNP 232.0* 294.0* 222.0*    ProBNP (last 3 results) No results for input(s): PROBNP in the last 8760 hours.    Other results:  Imaging: No results found.   Medications:     Scheduled Medications: . amiodarone  200 mg Oral BID  . aspirin  81 mg Oral Daily  . digoxin  0.125 mg Oral Daily  . docusate sodium  100 mg Oral BID  . furosemide  40 mg Oral Daily  . insulin  aspart  0-9 Units Subcutaneous TID WC  . rosuvastatin  5 mg Oral q1800  . sodium chloride flush  10-40 mL Intracatheter Q12H  . sodium chloride flush  3 mL Intravenous Q12H  . traZODone  100 mg Oral QHS    Infusions: . sodium chloride 75 mL/hr at 07/11/16 0930  . milrinone 0.25 mcg/kg/min (07/11/16 0700)    PRN Medications: sodium chloride, [DISCONTINUED] acetaminophen **OR** acetaminophen, acetaminophen, bisacodyl, diphenhydrAMINE, HYDROcodone-acetaminophen, hydrocortisone cream, morphine injection, nortriptyline, ondansetron **OR** ondansetron (ZOFRAN) IV, sodium chloride flush, sodium chloride flush   Assessment:   1. Cardiogenic Shock- TEE with EF 30-35% 2. Acute Respiratory Failure 3. A fib RVR 4. CAD -  2 vessel disease 5. Valvular Heart Disease-  Severe AS/moderate-severe MR (MR may be functional).  6. Caroltid Stenosis- Severe carotid stenosis RICA >80% stenosis by carotid dopplers, CTA head/neck with critical >90% RICA stenosis an [id number] LICA.     --s/p R carotid stent 8/28 7. Bilateral Effusions  8. Nasopharyngeal mass 9. Rash- 10. Hyperkalemia 11. Hematuria 12. UTI  Plan/Discussion:    CVP 5-6. Co-ox 81% on milrinone 0.25. Volume status and creatinine stable on po lasix 40 daily   Remains in A fib. HF 80s. Continue amio 200 mg twice a daily. NSVT quiesccent today. k and Mag ok. Off heparin due to hematuria. UA with UTI. Start cipro. Await urine cytology. I reviewed CT and has lesions on both kidneys. May be cysts. Will also discuss with urology regarding need for further eval/cysto.   2 vessel CAD, on ASA 81 and added low dose Crestor (prior myalgias with simvastatin).   No BB with low output. May be able to start low dose ACEI soon if creatinine remains stable after dye load.   He is too high risk for conventional surgery (low gradient severe AS and moderate to severe probably functional MR).  Plan carotid stent tomorrow and TAVR one week after stent.     He is  s/p R carotid stent today. Will continue ASA for carotid stent. (has allergy to Plavix and TAVR/VVS team felt too high risk for Brilinta). Will evaluate for ENVISAGE TAVI-AF trial comparing coumadin vs Sayvasa pot-TAVR.   Continue PT.    Length of Stay: 13  Concetta Guion MD 07/11/2016, 11:12 AM   Addendum: I spoke with Dr. Polly Cobia with radiology and lesion in left kidney appears to be simple cyst. The lesion on R kidney has motion degradation but also appears to  be simple cyst. No evidence of stones of bladder lesions.   Dawne Casali,MD 11:24 AM

## 2016-07-11 NOTE — Progress Notes (Addendum)
Addendum: Hold heparin for now per [facility]ens[facility]. Place SCDs. Continue to follow plan.  ANTICOAGULATION CONSULT NOTE - Follow Up Consult  Pharmacy Consult for Heparin Indication: atrial fibrillation  Allergies  Allergen Reactions  . Ambien [Zolpidem Tartrate] Other (See Comments)    Sleep walks  . Cholestatin   . Lipitor [Atorvastatin Calcium] Other (See Comments)    myalgias  . Ranitidine Other (See Comments)    Chest discomfort  . Simvastatin Other (See Comments)    Myalgias  . Xanax Xr [Alprazolam Er]     Tightness in chest  . Clopidogrel Bisulfate Rash    Patient Measurements: Height: 6\' 1"  (185.4 cm) Weight: 201 lb 9.6 oz (91.4 kg) IBW/kg (Calculated) : 79.9  Vital Signs: Temp: 96.5 F (35.8 C) (08/28 1119) Temp Source: Axillary (08/28 1119) BP: 100/58 (08/28 1000) Pulse Rate: 95 (08/28 1100)  Labs:  Recent Labs  07/09/16 0534 07/09/16 0535 07/10/16 0518 07/10/16 0519 07/11/16 0412  HGB 14.7  --  14.6  --  13.8  HCT 43.8  --  42.6  --  41.3  PLT 240  --  220  --  207  HEPARINUNFRC  --  0.62  --  0.54  --   CREATININE 1.53*  --  1.37*  --  1.25*    Estimated Creatinine Clearance: 51.5 mL/min (by C-G formula based on SCr of 1.25 mg/dL).   Assessment: 83yom on heparin for afib (while coumadin on hold) in the setting of cardiogenic shock.   Heparin was turned off overnight d/t hematuria. Patient is now s/p carotid stent with orders to resume anticoagulation. Nurse has not reported patient passing any urine this am to reassess hematuria but blood was seen on cath lab linens, bivalirudin and heparin bolus was used during the procedure.   Hgb is down 1g, will restart at much lower dose and titrate up slowly. Will lower heparin level goal.  Goal of Therapy:  Heparin level 0.3-0.5 units/ml Monitor platelets by anticoagulation protocol: Yes   Plan:  1) Restart heparin at 1000 units/hr 2) Heparin level in 8 hours after restart, CBC daily  Erin Hearing  PharmD., BCPS Clinical Pharmacist Pager [phone] 07/11/2016 1:57 PM

## 2016-07-12 LAB — BASIC METABOLIC PANEL
ANION GAP: 9 (ref 5–15)
BUN: 19 mg/dL (ref 6–20)
CALCIUM: 8.9 mg/dL (ref 8.9–10.3)
CO2: 26 mmol/L (ref 22–32)
CREATININE: 1.37 mg/dL — AB (ref 0.61–1.24)
Chloride: 94 mmol/L — ABNORMAL LOW (ref 101–111)
GFR, EST AFRICAN AMERICAN: 54 mL/min — AB (ref >60)
GFR, EST NON AFRICAN AMERICAN: 46 mL/min — AB (ref >60)
GLUCOSE: 233 mg/dL — AB (ref 65–99)
Potassium: 4.1 mmol/L (ref 3.5–5.1)
Sodium: 129 mmol/L — ABNORMAL LOW (ref 135–145)

## 2016-07-12 LAB — CBC
HEMATOCRIT: 37.1 % — AB (ref 39.0–52.0)
Hemoglobin: 12.5 g/dL — ABNORMAL LOW (ref 13.0–17.0)
MCH: 29.8 pg (ref 26.0–34.0)
MCHC: 33.7 g/dL (ref 30.0–36.0)
MCV: 88.5 fL (ref 78.0–100.0)
PLATELETS: 192 10*3/uL (ref 150–400)
RBC: 4.19 MIL/uL — ABNORMAL LOW (ref 4.22–5.81)
RDW: 14.4 % (ref 11.5–15.5)
WBC: 8.8 10*3/uL (ref 4.0–10.5)

## 2016-07-12 LAB — URINE CULTURE

## 2016-07-12 LAB — GLUCOSE, CAPILLARY
GLUCOSE-CAPILLARY: 177 mg/dL — AB (ref 65–99)
Glucose-Capillary: 197 mg/dL — ABNORMAL HIGH (ref 65–99)
Glucose-Capillary: 221 mg/dL — ABNORMAL HIGH (ref 65–99)
Glucose-Capillary: 187 mg/dL — ABNORMAL HIGH (ref 65–99)

## 2016-07-12 MED ORDER — LORATADINE 10 MG PO TABS
10.0000 mg | ORAL_TABLET | Freq: Every day | ORAL | Status: DC
  Administered 2016-07-13 – 2016-07-18 (×6): 10 mg via ORAL
  Filled 2016-07-12 (×6): qty 1

## 2016-07-12 MED ORDER — TAMSULOSIN HCL 0.4 MG PO CAPS
0.4000 mg | ORAL_CAPSULE | Freq: Every day | ORAL | Status: DC
Start: 2016-07-12 — End: 2016-07-19
  Administered 2016-07-12 – 2016-07-18 (×7): 0.4 mg via ORAL
  Filled 2016-07-12 (×7): qty 1

## 2016-07-12 MED ORDER — SODIUM CHLORIDE 0.9 % IV SOLN
INTRAVENOUS | Status: DC
  Administered 2016-07-12 (×2): 50 mL/h via INTRAVENOUS

## 2016-07-12 MED FILL — Norepinephrine Bitartrate IV Soln 1 MG/ML (Base Equivalent): INTRAVENOUS | Qty: 4 | Status: AC

## 2016-07-12 NOTE — Progress Notes (Signed)
PT Cancellation Note  Patient Details Name: [name] [name] MRN: [id number] DOB: 16-Dec-1931   Cancelled Treatment:    Reason Eval/Treat Not Completed: Fatigue/lethargy limiting ability to participate;Medical issues which prohibited therapy   3rd attempt to see patient today: 1) sleeping soundly, arouses and immediately back to sleep ("rough night" due to urinary retention issues)                                                      2) same                                                      3) patient s/p urology procedure. RN reports they do not want patient OOB/walking today. Ok to attempt 8/30 per RN   Laniece Hornbaker 07/12/2016, 2:47 PM  Pager [phone]

## 2016-07-12 NOTE — Progress Notes (Addendum)
  [facility] Progress Note  Subjective  - POD #1  Says right neck feels "sore." Denies issues with swallowing.   Objective Vitals:   07/12/16 0600 07/12/16 0746  BP: 105/82 114/80  Pulse: 82 79  Resp: 12   Temp:  97.3 F (36.3 C)    Intake/Output Summary (Last 24 hours) at 07/12/16 0818 Last data filed at 07/12/16 0700  Gross per 24 hour  Intake          1308.51 ml  Output              600 ml  Net           708.51 ml   Right groin with minimal ecchymosis. No hematoma.  Moving all extremities equally. No facial droop. No speech abnormalities.   Assessment/Planning: 82 y.o. male is s/p: right carotid stent 1 Day Post-Op   Stable s/p right carotid stent. Continue ASA. Ok to restart heparin.  Neuro exam intact.  Plan for TAVR next week.   Alvia Grove 07/12/2016 8:18 AM -- Agree with above.  No hematoma in groin.  Neuro at baseline If dc'd to home prior to TAVR will need dual anticoagulation of some type not just aspirin alone. Spoke with Dr Burt Knack regarding this. Will sign off and arrange follow up outpt in a few weeks  Ruta Hinds, MD [facility]: [phone] Pager: [phone]  Laboratory CBC    Component Value Date/Time   WBC 8.8 07/12/2016 0623   HGB 12.5 (L) 07/12/2016 0623   HCT 37.1 (L) 07/12/2016 0623   HCT 44.9 04/13/2016 1010   PLT 192 07/12/2016 0623   PLT 197 04/13/2016 1010    BMET    Component Value Date/Time   NA 129 (L) 07/12/2016 0623   NA 139 05/09/2016 1357   K 4.1 07/12/2016 0623   CL 94 (L) 07/12/2016 0623   CO2 26 07/12/2016 0623   GLUCOSE 233 (H) 07/12/2016 0623   BUN 19 07/12/2016 0623   BUN 16 05/09/2016 1357   CREATININE 1.37 (H) 07/12/2016 0623   CREATININE 1.31 (H) 06/09/2016 1654   CALCIUM 8.9 07/12/2016 0623   GFRNONAA 46 (L) 07/12/2016 0623   GFRAA 54 (L) 07/12/2016 0623    COAG Lab Results  Component Value Date   INR 1.53 06/30/2016   INR  1.72 06/29/2016   INR 1.66 06/28/2016   No results found for: PTT  Antibiotics Anti-infectives    Start     Dose/Rate Route Frequency Ordered Stop   07/11/16 1130  ciprofloxacin (CIPRO) tablet 500 mg     500 mg Oral 2 times daily 07/11/16 1124         Virgina Jock, PA-C [facility] Office: [phone] Pager: [phone] 07/12/2016 8:18 AM

## 2016-07-12 NOTE — Progress Notes (Signed)
1435 Came to see to walk with pt. Talked with RN. Will hold walk and follow up tomorrow. Graylon Good RN BSN 07/12/2016 2:38 PM

## 2016-07-12 NOTE — Progress Notes (Signed)
TCTS BRIEF PROGRESS NOTE   Mr Trapasso has done very well w/ carotid stent.  Overall looks good although having gross hematuria despite being off heparin.  Tentatively for TAVR next week.  Rexene Alberts, MD 07/12/2016 4:46 PM

## 2016-07-12 NOTE — Progress Notes (Signed)
Patient ID: [name] [name], male   DOB: 05-Oct-1932, 82 y.o.   MRN: [id number]    Advanced Heart Failure Rounding Note   Subjective:    Developed frank hematuria 07/10/16. Heparin stopped. UA and urine cytology sent. UA + for blood and WBCs.   S/p R Carotid stent 07/11/16/   Remains on milrinone 0.25 mcg. CVP remains 5-6.   Feeling OK this morning.  Denies SOB or CP.  Having trouble urinating, needed in/out cath this morning.  Still with blood urine and clots. Complaining of itching and rash on back.   TEE (8/17): EF 30-35%, severe AS, moderate to severe MR (suspect functional MR).   Carotid dopplers > 80% RICA stenosis.  CTA head/neck with >90% (critical) RICA stenosis and [id number] LICA.   RHC/LHC 06/17/16 RA 11 PCWP 25  CO/CI 3.25/1.4   Ost Cx to Prox Cx lesion, 50 %stenosed.  Mid Cx to Dist Cx lesion, 80 %stenosed.  Mid RCA lesion, 40 %stenosed.  Mid LAD lesion, 75 %stenosed.  2nd Diag lesion, 75 %stenosed.  Hemodynamic findings consistent with moderate pulmonary hypertension.  Unable to cross the aortic valve.  PA sat 52%. CO 3.2 L/min.   Objective:   Weight Range:  Vital Signs:   Temp:  [96.5 F (35.8 C)-98.1 F (36.7 C)] 97.3 F (36.3 C) (08/29 0746) Pulse Rate:  [60-120] 79 (08/29 0746) Resp:  [8-31] 12 (08/29 0600) BP: (85-135)/(42-87) 114/80 (08/29 0746) SpO2:  [93 %-100 %] 98 % (08/29 0746) Arterial Line BP: (98-133)/(43-62) 115/53 (08/28 1537) Weight:  [202 lb 13.2 oz (92 kg)] 202 lb 13.2 oz (92 kg) (08/29 0500) Last BM Date: 07/10/16  Weight change: Filed Weights   07/10/16 0500 07/11/16 0401 07/12/16 0500  Weight: 200 lb 8 oz (90.9 kg) 201 lb 9.6 oz (91.4 kg) 202 lb 13.2 oz (92 kg)    Intake/Output:   Intake/Output Summary (Last 24 hours) at 07/12/16 0759 Last data filed at 07/12/16 0700  Gross per 24 hour  Intake          1308.51 ml  Output              600 ml  Net           708.51 ml     Physical Exam: CVP 5-6 General:  NAD. Lying flat  in bed,   HEENT: normal Neck: supple. JVP flat Carotids 2+ bilat; no bruits. No thyromegaly or nodule noted. Cor: PMI nondisplaced. Irregularly irregular No S3.  2/6 systolic crescendo-decrescendo murmur with muffling of S2.   Lungs: clear Abdomen: soft, NT, ND, no HSM. No bruits or masses. +BS  Extremities: no cyanosis, clubbing, rash,no edema. R groin site stable.  Neuro: alert & orientedx3, cranial nerves grossly intact. moves all 4 extremities w/o difficulty. Affect pleasant Skin: Red rash on back   Telemetry: Reviewed personally, Afib 60-70s  Labs: Basic Metabolic Panel:  Recent Labs Lab 07/08/16 0410 07/09/16 0534 07/10/16 0518 07/11/16 0412 07/12/16 0623  NA 130* 132* 131* 130* 129*  K 5.3* 3.8 4.0 3.9 4.1  CL 93* 92* 94* 93* 94*  CO2 28 29 27 28 26   GLUCOSE 263* 220* 193* 241* 233*  BUN 18 22* 24* 20 19  CREATININE 1.35* 1.53* 1.37* 1.25* 1.37*  CALCIUM 9.8 9.8 9.4 9.3 8.9  MG  --   --  2.0  --   --     Liver Function Tests: No results for input(s): AST, ALT, ALKPHOS, BILITOT, PROT, ALBUMIN in the last 168  hours. No results for input(s): LIPASE, AMYLASE in the last 168 hours. No results for input(s): AMMONIA in the last 168 hours.  CBC:  Recent Labs Lab 07/08/16 0410 07/09/16 0534 07/10/16 0518 07/11/16 0412 07/12/16 0623  WBC 9.8 8.4 7.6 6.8 8.8  HGB 14.8 14.7 14.6 13.8 12.5*  HCT 43.9 43.8 42.6 41.3 37.1*  MCV 88.7 88.8 88.0 89.2 88.5  PLT 252 240 220 207 192    Cardiac Enzymes: No results for input(s): CKTOTAL, CKMB, CKMBINDEX, TROPONINI in the last 168 hours.  BNP: BNP (last 3 results)  Recent Labs  05/03/16 1305 05/28/16 1617 06/28/16 1316  BNP 232.0* 294.0* 222.0*    ProBNP (last 3 results) No results for input(s): PROBNP in the last 8760 hours.    Other results:  Imaging: No results found.   Medications:     Scheduled Medications: . amiodarone  200 mg Oral BID  . aspirin  81 mg Oral Daily  . ciprofloxacin  500 mg Oral  BID  . digoxin  0.125 mg Oral Daily  . docusate sodium  100 mg Oral BID  . furosemide  40 mg Oral Daily  . insulin aspart  0-9 Units Subcutaneous TID WC  . rosuvastatin  5 mg Oral q1800  . sodium chloride flush  10-40 mL Intracatheter Q12H  . sodium chloride flush  3 mL Intravenous Q12H  . traZODone  100 mg Oral QHS    Infusions: . milrinone 0.25 mcg/kg/min (07/11/16 1905)    PRN Medications: sodium chloride, [DISCONTINUED] acetaminophen **OR** acetaminophen, acetaminophen, bisacodyl, diphenhydrAMINE, HYDROcodone-acetaminophen, hydrocortisone cream, morphine injection, nortriptyline, ondansetron **OR** ondansetron (ZOFRAN) IV, sodium chloride flush, sodium chloride flush   Assessment:   1. Cardiogenic Shock- TEE with EF 30-35% 2. Acute Respiratory Failure 3. A fib RVR 4. CAD -  2 vessel disease 5. Valvular Heart Disease-  Severe AS/moderate-severe MR (MR may be functional).  6. Caroltid Stenosis- Severe carotid stenosis RICA >80% stenosis by carotid dopplers, CTA head/neck with critical >90% RICA stenosis an [id number] LICA.     --s/p R carotid stent 8/28 7. Bilateral Effusions  8. Nasopharyngeal mass 9. Rash- 10. Hyperkalemia 11. Hematuria 12. UTI  Plan/Discussion:    S/p R carotid stent 07/11/16. Continue ASA (allergic to plavix and too high risk for Brilinta)   CVP remains stable at 5-6. Coox pending this am on milrinone 0.25.    Volume status and creatinine stable on po lasix 40 daily.  Remains in Afib. HR 60-70s. Continue amio 200 mg BID. NSVT quiesccent. Electrolytes stable.   CT 07/04/16 with bilateral kidney lesions. Felt to be simple cysts.     He continues to have frank hematuria. Now on cipro for UTI. Awaiting urine cytology. Will consult urology for continued hematuria off heparin.   2 vessel CAD, on ASA 81 and added low dose Crestor (prior myalgias with simvastatin).   No BB with low output. Will look at starting low dose ACEI if creatinine remains stable.    He is too high risk for conventional surgery (low gradient severe AS and moderate to severe probably functional MR).   Continue PT.   Length of Stay: [place], [place] 07/12/2016, 7:59 AM  Advanced Heart Failure Team Pager [phone] (M-F; 7a - 4p)  Please contact [facility] for night-coverage after hours (4p -7a ) and weekends on [email or web address]     Patient seen and examined with Oda Kilts, PA-C. We discussed all aspects of the encounter. I agree with the assessment and plan  as stated above.   Did well with carotid stent yesterday. Still having problems with hematuria and now with some urinary retention. On cipro for UTI. CT without any evidence of stones or bladder mass. We have asked Urology to see.   Hemodynamics and CVP look good. Will continue milrinone and po lasix. Pending TAVR next week if remains stable.   AF rate controlled on po amio. Off AC due to bleeding. Has SCDs on. Consider ENVISAGE AF trial for Syvasa vs warfarin.   Continue PT.   Deyani Hegarty,MD 11:53 AM

## 2016-07-12 NOTE — Consult Note (Signed)
Urology Consult   Physician requesting consult: [name]  Reason for consult: Gross hematuria  History of Present Illness: [name] [name] is a 82 y.o. male with PMH significant for aortic stenosis, TIA, chronic afib, CHF, CKD II, DM, and BPH with BOO s/p TURP 09/27/11.  He is s/p right carotid stent 07/11/16 and has been on ASA and Heparin.  Pt developed hematuria yesterday and the heparin was stopped, however, he has continued to have gross hematuria.  UA yesterday was nitrite positive with large RBCs, WBCs, and bacteria. Urine culture was sent and he was started on PO Cipro.   He required I/O cath last night with 600cc output per RN and repeat I/O cath this morning around 8am with approx 300cc out.  He has only been able to void about 25cc red urine since and is complaining of need to void but he is not able to pass urine. Cr 1.37.  Pt and his nephew state that he was admitted over a week ago for resp failure and CHF at which time he went into retention requiring an indwelling foley.  His nephew states it was in place for several days but that the pt was able to void after it was removed. CT angio C/A/P performed 07/04/16 revealed no hydro or stones but multiple small bilateral renal cysts as well as a 2.3cm intermediate attenuation lesion at the junction of the lower pole and interpolar region of the right kidney that is new compared to the prior study, and considered indeterminate. Per radiology, further characterization with nonemergent MRI of the abdomen with and without IV gadolinium is recommended in the near future to provide definitive characterization of this lesion.  He was a pt of Dr. Rosana Hoes in our office but has not been seen since 2013 at which time he was doing well with minimal LUTS after TURP in 2012.  Pt is a poor historian but states he had one kidney stone many years ago.  He denies a history of voiding or storage urinary symptoms, hematuria, and UTIs prior to last week.   He  is currently trying to void and is restless with lower abdominal discomfort. He denies F/C, HA, SOB, CP, N/V, and diarrhea.  He states he has not had a BM since last week.   Past Medical History:  Diagnosis Date  . Aortic stenosis    a. mod-sev by echo 05/2016.  . Asthma   . Cerebrovascular disease 07/2010   TIA; carotid ultrasound in 07/2010-significant bilateral plaque without focal internal carotid artery stenosis; MRI -encephalomalacia left temporal and right temporal lobes; small inferior right cerebellar infarct; small vessel disease  . Chronic atrial fibrillation (HCC)    Paroxysmal; Echocardiogram in 2007-normal EF; mild LVH; left atrial enlargement; mild stenosis and minimal AI; negative stress nuclear study in 2008  . Chronic systolic CHF (congestive heart failure) ([place])    a. dx 05/2016 - EF 35-40%, diffuse HK, mod-severe AS, mod gradient, severe AVA VTI likely due to decreased cardiac output in setting of systolic dysfsunction and significant mitral regurgitation, mild MR, mod-severe MR, severe LAE, mild-mod RV dilation, mild RAE, mild-mod TR, mod PASP 59mmHg.  . CKD (chronic kidney disease), stage II   . Degenerative joint disease    feet and legs  . Diabetes mellitus    no insulin; A1c of 6.6 in 2005  . Dizziness    occurs daily,especially in am  . Exertional dyspnea   . Gastroesophageal reflux disease   . Hepatic steatosis   .  History of noncompliance with medical treatment   . Hyperlipidemia    adverse reactions to statins and niacin  . Hypertension    Borderline  . Irregular heartbeat   . Mitral regurgitation    a. mod-sev by echo 05/2016  . Peripheral vascular disease ([place])   . Renal insufficiency   . Temporal arteritis ([place])   . Tricuspid regurgitation    a. mild-mod TR by echo 05/2016    Past Surgical History:  Procedure Laterality Date  . COLONOSCOPY  2002  . COLONOSCOPY  01/19/2012   Procedure: COLONOSCOPY;  Surgeon: Rogene Houston, MD;  Location[facility];  Service: Endoscopy;  Laterality: N/A;  1030  . LIPOMA EXCISION  1980  . ORIF ANKLE FRACTURE  2000   Right  . PERIPHERAL VASCULAR CATHETERIZATION N/A 07/11/2016   Procedure: Carotid PTA/Stent Intervention;  Surgeon: Lorretta Harp, MD;  Location: [facility];  Service: Cardiovascular;  Laterality: N/A;  . PROSTATE SURGERY  12/2011  . ROTATOR CUFF REPAIR     Right  . TEE WITHOUT CARDIOVERSION N/A 06/17/2016   Procedure: TRANSESOPHAGEAL ECHOCARDIOGRAM (TEE);  Surgeon: Jerline Pain, MD;  Location: [facility];  Service: Cardiovascular;  Laterality: N/A;  . TRANSURETHRAL RESECTION OF PROSTATE  09/2011  . URETHRAL STRICTURE DILATATION  1980s    Current Hospital Medications:  Home Meds:   Scheduled Meds: . amiodarone  200 mg Oral BID  . aspirin  81 mg Oral Daily  . ciprofloxacin  500 mg Oral BID  . digoxin  0.125 mg Oral Daily  . docusate sodium  100 mg Oral BID  . furosemide  40 mg Oral Daily  . insulin aspart  0-9 Units Subcutaneous TID WC  . rosuvastatin  5 mg Oral q1800  . sodium chloride flush  10-40 mL Intracatheter Q12H  . sodium chloride flush  3 mL Intravenous Q12H  . traZODone  100 mg Oral QHS   Continuous Infusions: . milrinone 0.25 mcg/kg/min (07/12/16 1007)   PRN Meds:.sodium chloride, [DISCONTINUED] acetaminophen **OR** acetaminophen, acetaminophen, bisacodyl, diphenhydrAMINE, HYDROcodone-acetaminophen, hydrocortisone cream, morphine injection, nortriptyline, ondansetron **OR** ondansetron (ZOFRAN) IV, sodium chloride flush, sodium chloride flush  Allergies:  Allergies  Allergen Reactions  . Ambien [Zolpidem Tartrate] Other (See Comments)    Sleep walks  . Cholestatin   . Lipitor [Atorvastatin Calcium] Other (See Comments)    myalgias  . Ranitidine Other (See Comments)    Chest discomfort  . Simvastatin Other (See Comments)    Myalgias  . Xanax Xr [Alprazolam Er]     Tightness in chest  . Clopidogrel Bisulfate Rash    Family History   Problem Relation Age of Onset  . Stroke Mother   . Diabetes Father   . Colon cancer Neg Hx     Social History:  reports that he quit smoking about 24 years ago. His smoking use included Cigarettes. He started smoking about 66 years ago. He has a 20.00 pack-year smoking history. His smokeless tobacco use includes Chew. He reports that he does not drink alcohol or use drugs.  ROS: A complete review of systems was performed.  All systems are negative except for pertinent findings as noted.  Physical Exam:  Vital signs in last 24 hours: Temp:  [97.3 F (36.3 C)-98.1 F (36.7 C)] 97.7 F (36.5 C) (08/29 1138) Pulse Rate:  [60-90] 71 (08/29 1138) Resp:  [8-31] 22 (08/29 1138) BP: (85-121)/(42-87) 109/43 (08/29 1138) SpO2:  [90 %-100 %] 90 % (08/29 1138) Arterial Line  BP: (98-132)/(43-62) 115/53 (08/28 1537) Weight:  [92 kg (202 lb 13.2 oz)] 92 kg (202 lb 13.2 oz) (08/29 0500) Constitutional:  Alert and oriented, No acute distress Cardiovascular: irregular Respiratory: Normal respiratory effort GI: Abdomen is soft, tender and mildly distended over bladder GU: circum penis with no lesions, discharge, or rashes; clean dressing in right groin from endovascular procedure Lymphatic: No lymphadenopathy Neurologic: Grossly intact, no focal deficits Psychiatric: Normal mood and affect  Laboratory Data:   Recent Labs  07/10/16 0518 07/11/16 0412 07/12/16 0623  WBC 7.6 6.8 8.8  HGB 14.6 13.8 12.5*  HCT 42.6 41.3 37.1*  PLT 220 207 192     Recent Labs  07/10/16 0518 07/11/16 0412 07/12/16 0623  NA 131* 130* 129*  K 4.0 3.9 4.1  CL 94* 93* 94*  GLUCOSE 193* 241* 233*  BUN 24* 20 19  CALCIUM 9.4 9.3 8.9  CREATININE 1.37* 1.25* 1.37*     Results for orders placed or performed during the hospital encounter of 06/28/16 (from the past 24 hour(s))  POCT Activated clotting time     Status: None   Collection Time: 07/11/16  2:52 PM  Result Value Ref Range   Activated Clotting  Time 164 seconds  Glucose, capillary     Status: Abnormal   Collection Time: 07/11/16  3:31 PM  Result Value Ref Range   Glucose-Capillary 177 (H) 65 - 99 mg/dL   Comment 1 Capillary Specimen    Comment 2 Notify RN   Glucose, capillary     Status: Abnormal   Collection Time: 07/11/16  9:36 PM  Result Value Ref Range   Glucose-Capillary 181 (H) 65 - 99 mg/dL   Comment 1 Capillary Specimen    Comment 2 Notify RN   CBC     Status: Abnormal   Collection Time: 07/12/16  6:23 AM  Result Value Ref Range   WBC 8.8 4.0 - 10.5 K/uL   RBC 4.19 (L) 4.22 - 5.81 MIL/uL   Hemoglobin 12.5 (L) 13.0 - 17.0 g/dL   HCT 37.1 (L) 39.0 - 52.0 %   MCV 88.5 78.0 - 100.0 fL   MCH 29.8 26.0 - 34.0 pg   MCHC 33.7 30.0 - 36.0 g/dL   RDW 14.4 11.5 - 15.5 %   Platelets 192 150 - 400 K/uL  Basic metabolic panel     Status: Abnormal   Collection Time: 07/12/16  6:23 AM  Result Value Ref Range   Sodium 129 (L) 135 - 145 mmol/L   Potassium 4.1 3.5 - 5.1 mmol/L   Chloride 94 (L) 101 - 111 mmol/L   CO2 26 22 - 32 mmol/L   Glucose, Bld 233 (H) 65 - 99 mg/dL   BUN 19 6 - 20 mg/dL   Creatinine, Ser 1.37 (H) 0.61 - 1.24 mg/dL   Calcium 8.9 8.9 - 10.3 mg/dL   GFR calc non Af Amer 46 (L) >60 mL/min   GFR calc Af Amer 54 (L) >60 mL/min   Anion gap 9 5 - 15  Glucose, capillary     Status: Abnormal   Collection Time: 07/12/16  7:49 AM  Result Value Ref Range   Glucose-Capillary 197 (H) 65 - 99 mg/dL   Comment 1 Venous Specimen   Glucose, capillary     Status: Abnormal   Collection Time: 07/12/16 11:39 AM  Result Value Ref Range   Glucose-Capillary 221 (H) 65 - 99 mg/dL   Comment 1 Venous Specimen    No results found for  this or any previous visit (from the past 240 hour(s)).  Renal Function:  Recent Labs  07/06/16 0415 07/07/16 0432 07/08/16 0410 07/09/16 0534 07/10/16 0518 07/11/16 0412 07/12/16 [id number]  CREATININE 1.22 1.18 1.35* 1.53* 1.37* 1.25* 1.37*   Estimated Creatinine Clearance: 47 mL/min  (by C-G formula based on SCr of 1.37 mg/dL).  Radiologic Imaging: CLINICAL DATA:  82 year old male with history of severe aortic stenosis. Preprocedural study prior to potential transcatheter aortic valve replacement (TAVR) procedure.  EXAM: CT ANGIOGRAPHY CHEST, ABDOMEN AND PELVIS  TECHNIQUE: Multidetector CT imaging through the chest, abdomen and pelvis was performed using the standard protocol during bolus administration of intravenous contrast. Multiplanar reconstructed images and MIPs were obtained and reviewed to evaluate the vascular anatomy.  CONTRAST:  65 mL of Isovue 370.  COMPARISON:  CT the abdomen and pelvis 06/24/2010.  FINDINGS: CTA CHEST FINDINGS  Cardiovascular: Heart size is mildly enlarged with left atrial dilatation. There is no significant pericardial fluid, thickening or pericardial calcification. There is aortic atherosclerosis, as well as atherosclerosis of the great vessels of the mediastinum and the coronary arteries, including calcified atherosclerotic plaque in the left main, left anterior descending, left circumflex and right coronary arteries. Severe thickening calcifications of the aortic valve. Right upper extremity PICC with tip terminating at the superior cavoatrial junction.  Mediastinum/Nodes: No pathologically enlarged mediastinal or hilar lymph nodes. Esophagus is unremarkable in appearance. No axillary lymphadenopathy.  Lungs/Pleura: Moderate to large right and trace left pleural effusions lie dependently. Significant regions of passive subsegmental atelectasis are noted in the lower lobes of the lungs bilaterally (right greater than left). No acute consolidative airspace disease. No pleural effusions. There are several small pulmonary nodules in the lungs bilaterally. The largest solid-appearing pulmonary nodule is a 4 mm subpleural nodule associated with the major fissure in the anterior aspect of the superior segment of  the right lower lobe (image 46 of series 407). There is also a 1.6 x 1.7 cm ground-glass attenuation nodule with tiny 5 mm central solid component in the apex of the right upper lobe (image 26 of series 407 and image 33 of series 401). Mild centrilobular and paraseptal emphysema noted predominantly in the lung apices.  Musculoskeletal: Well-defined sclerotic lesion in the posterior aspect of the right T8 rib is similar to the prior study from 06/24/2010, presumably a bone island. There are no aggressive appearing lytic or blastic lesions noted in the visualized portions of the skeleton.  CTA ABDOMEN AND PELVIS FINDINGS  Hepatobiliary: The liver has a shrunken appearance and slightly nodular contour, compatible with early changes of cirrhosis. No discrete cystic or solid hepatic lesions are noted. No intra or extrahepatic biliary ductal dilatation. Gallbladder is normal in appearance.  Pancreas: No pancreatic mass. No pancreatic ductal dilatation. No pancreatic or peripancreatic fluid or inflammatory changes.  Spleen: Unremarkable.  Adrenals/Urinary Tract: Bilateral adrenal glands are normal in appearance. Exophytic 3.5 cm low-attenuation lesion in the upper pole the left kidney is only slightly larger than the prior examination from 06/24/2010, compatible with a simple cyst. Other sub cm low-attenuation lesions in the kidneys bilaterally are too small to definitively characterize, but also favored to represent tiny cysts. There is a 2.3 cm intermediate attenuation (26-34 HU) lesion in the lateral aspect of the right kidney at the junction of the interpolar and lower pole regions which is indeterminate, but new compared to the prior study. No hydroureteronephrosis. Urinary bladder is generally unremarkable in appearance, although there is a small amount of gas non  dependently in the bladder, presumably related to the indwelling Foley catheter.  Stomach/Bowel: The  appearance of the stomach is normal. There is no pathologic dilatation of small bowel or colon. Numerous colonic diverticulae are noted, particularly in the proximal sigmoid colon, without surrounding inflammatory changes to suggest an acute diverticulitis at this time. Normal appendix.  Vascular/Lymphatic: Extensive atherosclerosis is noted throughout the abdominal and pelvic vasculature, with vascular findings and measurements pertinent to potential TAVR procedure, as detailed below. No aneurysm or dissection is noted in the abdominal or pelvic vasculature. Single left renal artery. Tiny accessory renal artery to the lower pole of the right kidney noted. Renal arteries are patent bilaterally. Celiac axis and major branches appear widely patent. Mild stenosis in the proximal superior mesenteric artery, which is otherwise widely patent. Inferior mesenteric artery appears widely patent.  Reproductive: Prostate gland and seminal vesicles are unremarkable in appearance.  Other: No significant volume of ascites.  No pneumoperitoneum.  Musculoskeletal: Chronic appearing compression fracture at L2 with approximately 40% loss of central vertebral body height, only slightly increased compared to prior examinations. There are no aggressive appearing lytic or blastic lesions noted in the visualized portions of the skeleton.  VASCULAR MEASUREMENTS PERTINENT TO TAVR:  AORTA:  Minimal Aortic Diameter -  17 x 12 mm  Severity of Aortic Calcification -  moderate to severe  RIGHT PELVIS:  Right Common Iliac Artery -  Minimal Diameter - 9.2 x 9.2 mm  Tortuosity - moderate  Calcification - mild  Right External Iliac Artery -  Minimal Diameter - 6.9 x 6.5 mm  Tortuosity - moderate to severe  Calcification - none  Right Common Femoral Artery -  Minimal Diameter - 7.3 x 4.9 mm  Tortuosity - mild  Calcification - moderate  LEFT PELVIS:  Left Common  Iliac Artery -  Minimal Diameter - 9.7 x 9.7 mm  Tortuosity - minimal  Calcification - moderate  Left External Iliac Artery -  Minimal Diameter - 6.8 x 6.0 mm  Tortuosity - mild to moderate  Calcification - none  Left Common Femoral Artery -  Minimal Diameter - 7.6 x 5.4 mm  Tortuosity - mild  Calcification - mild  Review of the MIP images confirms the above findings.  IMPRESSION: 1. Vascular findings and measurements pertinent to potential TAVR procedure, as detailed above. The patient does appear to have suitable pelvic arterial access, particularly on the left side. 2. Thickened and heavily calcified aortic valve, compatible with the reported clinical history of severe aortic stenosis. 3. Cardiomegaly with left atrial dilatation. 4. Aortic atherosclerosis, in addition to left main and 3 vessel coronary artery disease. 5. An incidental finding of potential clinical significance has been found. 2.3 cm intermediate attenuation lesion at the junction of the lower pole and interpolar region of the right kidney is new compared to the prior study, and considered indeterminate. Further characterization with nonemergent MRI of the abdomen with and without IV gadolinium is recommended in the near future to provide definitive characterization of this lesion. 6. Morphologic changes in the liver suggestive of early cirrhosis. 7. Extensive colonic diverticulosis without evidence to suggest an acute diverticulitis at this time. 8. Additional incidental findings, as above.   Electronically Signed   By: Vinnie Langton M.D.   On: 07/11/2016 16:08   Foley placement:  Area prepped with betadine and draped in sterile fashion.  A 20 3-way foley was placed without difficulty with immediate return of 700cc dark yellow urine.  Few clots were noted.  30cc sterile water placed in foley balloon.  Foley was then hand irrigated with approx 300cc sterile saline with no  clots and clear/yellow fluid return.  Pt tolerated well and voiced relief of lower abdominal pain. Extra port was closed with cath plug.    Impression/Recommendation Acute urinary retention--cause is likely multifactorial including BPH/BOO, pain medications, decreased mobility, UTI, and possible blood clots from [facility].  Leave foley until acute hospital issues are resolved and he is more mobile. Start flomax. He will need a void trial either as an inpt or in our office as an outpt.   Gross hematuria--cause is also multifactorial including foley trauma, anticoagulation, and UTI.  Continue Cipro and f/u urine culture.  Adjust ABx as needed and treat for 7 days.  It appears to have resolved but for thoroughness will start CBIs for a short period of time.  Anticoagulation per [facility].   New lesion right kidney--consider MRI after he has recovered from all current medical issues.   Constipation--treatment per primary team.  Can contribute to urinary retention so resolution is needed.    [place], [place] 07/12/2016, 1:15 PM

## 2016-07-12 NOTE — Progress Notes (Signed)
Carboxyhemoglobin sample received resulted as arterial blood.  RN notified of same.  Order left pending until mixed venous sample is received.

## 2016-07-12 NOTE — Progress Notes (Signed)
Nutrition Follow-up  DOCUMENTATION CODES:   Not applicable  INTERVENTION:   -Continue with Heart Healthy diet -Continue to encourage adequate PO intake  NUTRITION DIAGNOSIS:   Inadequate oral intake related to poor appetite, chronic illness as evidenced by per patient/family report.  Progressing  GOAL:   Patient will meet greater than or equal to 90% of their needs  Progressing  MONITOR:   PO intake, I & O's, Labs, Weight trends, Supplement acceptance  REASON FOR ASSESSMENT:   Malnutrition Screening Tool    ASSESSMENT:    [name] [name] is a 82 y.o. male with history of CAF, HTN, HL, DM2, systolic CHF w reduced EF 35-40%, carotid stenosis and aortic valve stenosis.  Presenting with marked DOE , can't walk 10-15 feet and unable to lie flat last night to sleep.  Seen in ED by cardiologist and noted that wt's are down 15 lbs since last visit but still severe DOE  Pt underwent rt carotid stent placement on 07/11/16. Noted potential for future TAVR.   Pt in with MD at time of visit. Case discussed with RN, who reports that pt did not get much sleep last night, due to urinary retention. Urology has been consulted. RN confirms continued good appetite. Meal completion 75-100%.   Labs reviewed: Na: 129, CBGS: 177-221.   Diet Order:  Diet heart healthy/carb modified Room service appropriate? Yes; Fluid consistency: Thin  Skin:  Reviewed, no issues  Last BM:  07/10/16  Height:   Ht Readings from Last 1 Encounters:  07/01/16 6\' 1"  (1.854 m)    Weight:   Wt Readings from Last 1 Encounters:  07/12/16 202 lb 13.2 oz (92 kg)    Ideal Body Weight:  83.63 kg  BMI:  Body mass index is 26.76 kg/m.  Estimated Nutritional Needs:   Kcal:  1800-2100 calories  Protein:  95-115 grams  Fluid:  >/= 1.8L  EDUCATION NEEDS:   No education needs identified at this time  Chana Lindstrom A. Jimmye Norman, RD, LDN, CDE Pager: [phone] After hours Pager: [phone]

## 2016-07-13 ENCOUNTER — Ambulatory Visit: Payer: Medicare Other | Admitting: Family Medicine

## 2016-07-13 ENCOUNTER — Other Ambulatory Visit: Payer: Self-pay | Admitting: *Deleted

## 2016-07-13 DIAGNOSIS — I6523 Occlusion and stenosis of bilateral carotid arteries: Secondary | ICD-10-CM

## 2016-07-13 DIAGNOSIS — N390 Urinary tract infection, site not specified: Secondary | ICD-10-CM

## 2016-07-13 DIAGNOSIS — R339 Retention of urine, unspecified: Secondary | ICD-10-CM

## 2016-07-13 DIAGNOSIS — Z9862 Peripheral vascular angioplasty status: Secondary | ICD-10-CM

## 2016-07-13 DIAGNOSIS — A499 Bacterial infection, unspecified: Secondary | ICD-10-CM

## 2016-07-13 LAB — BASIC METABOLIC PANEL
Anion gap: 8 (ref 5–15)
BUN: 18 mg/dL (ref 6–20)
CALCIUM: 8.9 mg/dL (ref 8.9–10.3)
CO2: 27 mmol/L (ref 22–32)
CREATININE: 1.34 mg/dL — AB (ref 0.61–1.24)
Chloride: 97 mmol/L — ABNORMAL LOW (ref 101–111)
GFR calc non Af Amer: 48 mL/min — ABNORMAL LOW (ref >60)
GFR, EST AFRICAN AMERICAN: 55 mL/min — AB (ref >60)
GLUCOSE: 205 mg/dL — AB (ref 65–99)
Potassium: 3.8 mmol/L (ref 3.5–5.1)
Sodium: 132 mmol/L — ABNORMAL LOW (ref 135–145)

## 2016-07-13 LAB — CBC
HEMATOCRIT: 34.7 % — AB (ref 39.0–52.0)
Hemoglobin: 11.4 g/dL — ABNORMAL LOW (ref 13.0–17.0)
MCH: 29.2 pg (ref 26.0–34.0)
MCHC: 32.9 g/dL (ref 30.0–36.0)
MCV: 89 fL (ref 78.0–100.0)
Platelets: 162 10*3/uL (ref 150–400)
RBC: 3.9 MIL/uL — ABNORMAL LOW (ref 4.22–5.81)
RDW: 14.2 % (ref 11.5–15.5)
WBC: 7.3 10*3/uL (ref 4.0–10.5)

## 2016-07-13 LAB — CARBOXYHEMOGLOBIN
CARBOXYHEMOGLOBIN: 1.3 % (ref 0.5–1.5)
Methemoglobin: 0.8 % (ref 0.0–1.5)
O2 SAT: 64.6 %
Total hemoglobin: 11.5 g/dL — ABNORMAL LOW (ref 12.0–16.0)

## 2016-07-13 LAB — GLUCOSE, CAPILLARY
GLUCOSE-CAPILLARY: 209 mg/dL — AB (ref 65–99)
GLUCOSE-CAPILLARY: 211 mg/dL — AB (ref 65–99)
Glucose-Capillary: 177 mg/dL — ABNORMAL HIGH (ref 65–99)
Glucose-Capillary: 155 mg/dL — ABNORMAL HIGH (ref 65–99)

## 2016-07-13 LAB — DIGOXIN LEVEL: Digoxin Level: 0.5 ng/mL — ABNORMAL LOW (ref 0.8–2.0)

## 2016-07-13 MED ORDER — POLYETHYLENE GLYCOL 3350 17 G PO PACK: 17.0000 g | PACK | Freq: Every day | ORAL | Status: DC | PRN

## 2016-07-13 MED ORDER — OXYBUTYNIN CHLORIDE 5 MG PO TABS: 5.0000 mg | ORAL_TABLET | Freq: Three times a day (TID) | ORAL | Status: DC

## 2016-07-13 MED ORDER — POLYETHYLENE GLYCOL 3350 17 G PO PACK: 17.0000 g | PACK | Freq: Once | ORAL | Status: DC

## 2016-07-13 MED ORDER — BELLADONNA ALKALOIDS-OPIUM 16.2-60 MG RE SUPP
1.0000 | Freq: Three times a day (TID) | RECTAL | Status: DC | PRN
  Administered 2016-07-13 – 2016-07-14 (×2): 1 via RECTAL
  Filled 2016-07-13 (×3): qty 1

## 2016-07-13 MED ORDER — FENTANYL CITRATE (PF) 100 MCG/2ML IJ SOLN: 50.0000 ug | Freq: Once | INTRAMUSCULAR | Status: AC

## 2016-07-13 MED ORDER — OXYBUTYNIN CHLORIDE 5 MG PO TABS: 10.0000 mg | ORAL_TABLET | Freq: Once | ORAL | Status: DC

## 2016-07-13 MED ORDER — FENTANYL CITRATE (PF) 100 MCG/2ML IJ SOLN
INTRAMUSCULAR | Status: AC
  Administered 2016-07-13: 50 ug
  Filled 2016-07-13: qty 2

## 2016-07-13 MED ORDER — SORBITOL 70 % SOLN
30.0000 mL | Freq: Once | Status: AC
  Administered 2016-07-13: 30 mL via ORAL
  Filled 2016-07-13: qty 30

## 2016-07-13 MED ORDER — MIDAZOLAM HCL 2 MG/2ML IJ SOLN: 1.0000 mg | Freq: Once | INTRAMUSCULAR | Status: AC

## 2016-07-13 MED ORDER — MIDAZOLAM HCL 2 MG/2ML IJ SOLN
INTRAMUSCULAR | Status: AC
  Administered 2016-07-13: 1 mg
  Filled 2016-07-13: qty 2

## 2016-07-13 NOTE — Progress Notes (Addendum)
Paged to room for significant bladder spasm with marked discomfort.   No relief with morphine or opioid belladona suppository.   Given 50 mcg fentanyl and 1 mg versed.   Per Urologist CBI balloon deflated and reposition. With inflation pt had prompt draining of ~1000 cc of urine, despite Bladder scan only showing 138 cc.    Pt had near immediate relief.   Legrand Como [address] [place], PA-C 07/13/2016 1:15 PM

## 2016-07-13 NOTE — Progress Notes (Signed)
Patient still in distress. RN has called the urologist office and spoken with the RN. Awaiting orders. Have given a third dose of morphine for pain. Catheter is leaking, patient appears to be having bladder spasms despite the use of the opioid belladona suppository. Patient is up in chair and refuses to get back into bed, states he prefers to be able to stand up when he needs to to help with the pain. Family is at the bedside.

## 2016-07-13 NOTE — Progress Notes (Signed)
Patient ID: SAMIUELA WURTZEL, male   DOB: 03-07-1932, 82 y.o.   MRN: [id number]    Advanced Heart Failure Rounding Note   Subjective:    Developed frank hematuria 07/10/16. Heparin stopped. UA and urine cytology sent. UA + for blood and WBCs.   S/p R Carotid stent 07/11/16/   Remains on milrinone 0.25 mcg. Coox 64.6%. CVP 5-6  Urology saw 07/12/16 for hematuria/urinary retention. Thought to be likely due to infection. Cipro x 7 days + flomax. Recommended stool regimen as well.   Currently undergoing bladder irrigation.   Feels Ok this morning. Urination much improved with foley.   TEE (8/17): EF 30-35%, severe AS, moderate to severe MR (suspect functional MR).   Carotid dopplers > 80% RICA stenosis.  CTA head/neck with >90% (critical) RICA stenosis and [id number] LICA.   RHC/LHC 06/17/16 RA 11 PCWP 25  CO/CI 3.25/1.4   Ost Cx to Prox Cx lesion, 50 %stenosed.  Mid Cx to Dist Cx lesion, 80 %stenosed.  Mid RCA lesion, 40 %stenosed.  Mid LAD lesion, 75 %stenosed.  2nd Diag lesion, 75 %stenosed.  Hemodynamic findings consistent with moderate pulmonary hypertension.  Unable to cross the aortic valve.  PA sat 52%. CO 3.2 L/min.   Objective:   Weight Range:  Vital Signs:   Temp:  [97.3 F (36.3 C)-98.5 F (36.9 C)] 98.5 F (36.9 C) (08/30 0732) Pulse Rate:  [61-71] 65 (08/30 0732) Resp:  [13-32] 18 (08/30 0732) BP: (80-123)/(43-55) 123/50 (08/30 0732) SpO2:  [90 %-100 %] 97 % (08/30 0732) Weight:  [200 lb 9.9 oz (91 kg)] 200 lb 9.9 oz (91 kg) (08/30 0303) Last BM Date: 07/10/16  Weight change: Filed Weights   07/11/16 0401 07/12/16 0500 07/13/16 0303  Weight: 201 lb 9.6 oz (91.4 kg) 202 lb 13.2 oz (92 kg) 200 lb 9.9 oz (91 kg)    Intake/Output:   Intake/Output Summary (Last 24 hours) at 07/13/16 0857 Last data filed at 07/13/16 0737  Gross per 24 hour  Intake           3858.4 ml  Output             5475 ml  Net          -1616.6 ml     Physical Exam: CVP  5-6 General:  NAD. Lying flat in bed,   HEENT: normal Neck: supple. JVP flat Carotids 2+ bilat; no bruits. No thyromegaly or nodule noted. Cor: PMI nondisplaced. Irregularly irregular No S3.  2/6 systolic crescendo-decrescendo murmur with muffling of S2.   Lungs: clear Abdomen: soft, NT, ND, no HSM. No bruits or masses. +BS  Extremities: no cyanosis, clubbing, rash,no edema. R groin site stable.  Neuro: alert & orientedx3, cranial nerves grossly intact. moves all 4 extremities w/o difficulty. Affect pleasant Skin: Red rash on back   Telemetry: Reviewed, Afibs 60-70s  Labs: Basic Metabolic Panel:  Recent Labs Lab 07/09/16 0534 07/10/16 0518 07/11/16 0412 07/12/16 0623 07/13/16 0500  NA 132* 131* 130* 129* 132*  K 3.8 4.0 3.9 4.1 3.8  CL 92* 94* 93* 94* 97*  CO2 29 27 28 26 27   GLUCOSE 220* 193* 241* 233* 205*  BUN 22* 24* 20 19 18   CREATININE 1.53* 1.37* 1.25* 1.37* 1.34*  CALCIUM 9.8 9.4 9.3 8.9 8.9  MG  --  2.0  --   --   --     Liver Function Tests: No results for input(s): AST, ALT, ALKPHOS, BILITOT, PROT, ALBUMIN in the last 168  hours. No results for input(s): LIPASE, AMYLASE in the last 168 hours. No results for input(s): AMMONIA in the last 168 hours.  CBC:  Recent Labs Lab 07/09/16 0534 07/10/16 0518 07/11/16 0412 07/12/16 0623 07/13/16 0500  WBC 8.4 7.6 6.8 8.8 7.3  HGB 14.7 14.6 13.8 12.5* 11.4*  HCT 43.8 42.6 41.3 37.1* 34.7*  MCV 88.8 88.0 89.2 88.5 89.0  PLT 240 220 207 192 162    Cardiac Enzymes: No results for input(s): CKTOTAL, CKMB, CKMBINDEX, TROPONINI in the last 168 hours.  BNP: BNP (last 3 results)  Recent Labs  05/03/16 1305 05/28/16 1617 06/28/16 1316  BNP 232.0* 294.0* 222.0*    ProBNP (last 3 results) No results for input(s): PROBNP in the last 8760 hours.    Other results:  Imaging: No results found.   Medications:     Scheduled Medications: . amiodarone  200 mg Oral BID  . aspirin  81 mg Oral Daily  .  ciprofloxacin  500 mg Oral BID  . digoxin  0.125 mg Oral Daily  . docusate sodium  100 mg Oral BID  . furosemide  40 mg Oral Daily  . insulin aspart  0-9 Units Subcutaneous TID WC  . loratadine  10 mg Oral Daily  . rosuvastatin  5 mg Oral q1800  . sodium chloride flush  10-40 mL Intracatheter Q12H  . sodium chloride flush  3 mL Intravenous Q12H  . tamsulosin  0.4 mg Oral Daily  . traZODone  100 mg Oral QHS    Infusions: . sodium chloride 50 mL/hr (07/12/16 1715)  . milrinone 0.25 mcg/kg/min (07/12/16 2319)    PRN Medications: sodium chloride, [DISCONTINUED] acetaminophen **OR** acetaminophen, acetaminophen, bisacodyl, diphenhydrAMINE, HYDROcodone-acetaminophen, hydrocortisone cream, morphine injection, nortriptyline, ondansetron **OR** ondansetron (ZOFRAN) IV, sodium chloride flush, sodium chloride flush   Assessment:   1. Cardiogenic Shock- TEE with EF 30-35% 2. Acute Respiratory Failure 3. A fib RVR 4. CAD -  2 vessel disease 5. Valvular Heart Disease-  Severe AS/moderate-severe MR (MR may be functional).  6. Caroltid Stenosis- Severe carotid stenosis RICA >80% stenosis by carotid dopplers, CTA head/neck with critical >90% RICA stenosis an [id number] LICA.     --s/p R carotid stent 8/28 7. Bilateral Effusions  8. Nasopharyngeal mass 9. Rash- 10. Hyperkalemia 11. Hematuria 12. UTI  Plan/Discussion:    S/p R carotid stent 07/11/16. Continue ASA (allergic to plavix and too high risk for Brilinta.  CVP 5-6. Coox stable on milrinone 0.25 mcg/kg/min  Volume status and creatinine stable on po lasix 40 daily.  Remains in Afib. Rate controlled in 60-70s. Continue amio 200 mg BID. NSVT quiesccent. Electrolytes stable.   CT 07/04/16 with bilateral kidney lesions. Felt to be simple cysts.     Appreciate urology input.  Voiding trial recommended for 07/14/16. Will complete 7 day course of Cipro. Continue flomax.  Add bowel regimen.    2 vessel CAD, on ASA 81 and low dose Crestor (prior  myalgias with simvastatin).   No BB with low output. Will eventually add ACEi with stable creatinine.   He is too high risk for conventional surgery (low gradient severe AS and moderate to severe probably functional MR).   Continue PT.   Length of Stay: [address]  Annamaria Helling 07/13/2016, 8:57 AM  Advanced Heart Failure Team Pager [phone] (M-F; 7a - 4p)  Please contact [facility] for night-coverage after hours (4p -7a ) and weekends on [email or web address]  Patient seen and examined with Oda Kilts, PA-C. We discussed  all aspects of the encounter. I agree with the assessment and plan as stated above.   Remains stable on IV milrinone. Volume status and co-ox ok. Renal function stable. Now s/p stenting of R carotid. He developed hematuria on heparin with urinary retention. Foley with CBI placed. Today catheter became misplaced with severe pain. We spoke with Urology and catheter repositioned and now functioning normally. Continue cipro for UTI.   Will hold heparin overnight and resume tomorrow with no bolus. Continue amio for rate control.   Plan TAVR next Tuesday.  Continue medical management of CAD.   Working with PT and mobility continues to improve.   Stormy Sabol,MD 6:00 PM

## 2016-07-13 NOTE — Progress Notes (Signed)
TAVR Team Note:  Pt doing well. Chart reviewed and hematuria/urolgic issues noted. Seems to be doing better. Agree with resuming heparin tomorrow am as planned per Dr Haroldine Laws. Pt's niece at bedside - plans for TAVR next Tuesday reviewed with patient and family.   Sherren Mocha 07/13/2016 6:31 PM

## 2016-07-13 NOTE — Progress Notes (Addendum)
Physical Therapy Treatment Patient Details Name: [name] [name] MRN: [id number] DOB: 1931-12-11 Today's Date: 07/13/2016    History of Present Illness Patient is an 82 yo male admitted 06/28/16 with marked DOE.  Patient with cardiogenic shock, acute respiratory failure, CAD-2 vessel disease, valvular disease - severe AS/severe MR, carotid stenosis - RICA stenosis 80-90, Afib with RVR.      PMH:  CAD, HTN, HLD, DM, systolic CHF w reduced EF 35-40%, carotid stenosis and aortic valve stenosis, TIA/CVA, Afib, CKD, DJD, PVD    PT Comments    The patient was anxious about feeling need to empty bladder. RN aware. Continue PT while in acute care.   Follow Up Recommendations  Home health PT;SNF (family not present to discuss that patient may benefit from short term SNF/rehab if he remains weak and with medical issues.      Equipment Recommendations    TBA   Recommendations for Other Services       Precautions / Restrictions Precautions Precautions: Fall Restrictions Weight Bearing Restrictions: No    Mobility  Bed Mobility   Bed Mobility: Supine to Sit     Supine to sit: Min assist     General bed mobility comments: assist for lines and tubes  Transfers Overall transfer level: Needs assistance Equipment used: 1 person hand held assist Transfers: Sit to/from Stand;Stand Pivot Transfers Sit to Stand: Min assist Stand pivot transfers: Min assist       General transfer comment: attempted x 2 to get to standing from low bed., hand hold assist to power up.   stood for 3 minute for sponge bath.  Hand hold assist with several shuffle steps to ransfer to recliner. cues for hand placement.  Ambulation/Gait                 Stairs            Wheelchair Mobility    Modified Rankin (Stroke Patients Only)       Balance           Standing balance support: Single extremity supported;During functional activity Standing balance-Leahy Scale: Fair                      Cognition Arousal/Alertness: Awake/alert Behavior During Therapy: WFL for tasks assessed/performed;Restless Overall Cognitive Status: Within Functional Limits for tasks assessed                      Exercises      General Comments        Pertinent Vitals/Pain Pain Assessment: Faces Faces Pain Scale: Hurts whole lot Pain Location: trying to pass urine with catheter, RN aware Pain Descriptors / Indicators: Discomfort;Grimacing;Guarding;Moaning;Restless;Squeezing Pain Intervention(s): Monitored during session;Repositioned;Relaxation    Home Living                      Prior Function            PT Goals (current goals can now be found in the care plan section) Progress towards PT goals: Progressing toward goals    Frequency  Min 3X/week    PT Plan  (will need to reevaluate after  surgery which is scheduled next week.)    Co-evaluation             End of Session   Activity Tolerance: Patient limited by pain Patient left: in chair;with call bell/phone within reach;with chair alarm set;with nursing/sitter in room     Time:  [id number] PT Time Calculation (min) (ACUTE ONLY): 27 min  Charges:  $Therapeutic Activity: 23-37 mins                    G Codes:      Claretha Cooper 07/13/2016, 10:23 AM Tresa Endo PT [phone]

## 2016-07-13 NOTE — Progress Notes (Signed)
Patient up to chair. Has started having sever bladder spasms. Flow is still good, no displacement. Morphine given for pain. Have paged urologist on call for further assistance. Bladder scan reveals only 147ml. Urology has returned call and has ordered medication to help with the spasms. Will administer as soon as pharmacy allows.

## 2016-07-13 NOTE — Progress Notes (Signed)
Patient much better this afternoon, no pain, good urinary output. Vital signs are stable, no nausea or vomiting, No questions at this time. Visitors at bedside throughout the day. Call bell in reach, will continue to monitor.

## 2016-07-14 LAB — GLUCOSE, CAPILLARY
GLUCOSE-CAPILLARY: 241 mg/dL — AB (ref 65–99)
GLUCOSE-CAPILLARY: 178 mg/dL — AB (ref 65–99)
Glucose-Capillary: 196 mg/dL — ABNORMAL HIGH (ref 65–99)
Glucose-Capillary: 145 mg/dL — ABNORMAL HIGH (ref 65–99)

## 2016-07-14 LAB — BASIC METABOLIC PANEL
ANION GAP: 9 (ref 5–15)
BUN: 16 mg/dL (ref 6–20)
CALCIUM: 9 mg/dL (ref 8.9–10.3)
CO2: 26 mmol/L (ref 22–32)
Chloride: 97 mmol/L — ABNORMAL LOW (ref 101–111)
Creatinine, Ser: 1.3 mg/dL — ABNORMAL HIGH (ref 0.61–1.24)
GFR calc Af Amer: 57 mL/min — ABNORMAL LOW (ref >60)
GFR calc non Af Amer: 49 mL/min — ABNORMAL LOW (ref >60)
GLUCOSE: 185 mg/dL — AB (ref 65–99)
POTASSIUM: 3.8 mmol/L (ref 3.5–5.1)
Sodium: 132 mmol/L — ABNORMAL LOW (ref 135–145)

## 2016-07-14 LAB — CBC
HCT: 34.8 % — ABNORMAL LOW (ref 39.0–52.0)
Hemoglobin: 11.5 g/dL — ABNORMAL LOW (ref 13.0–17.0)
MCH: 29.7 pg (ref 26.0–34.0)
MCHC: 33 g/dL (ref 30.0–36.0)
MCV: 89.9 fL (ref 78.0–100.0)
PLATELETS: 171 10*3/uL (ref 150–400)
RBC: 3.87 MIL/uL — ABNORMAL LOW (ref 4.22–5.81)
RDW: 14.6 % (ref 11.5–15.5)
WBC: 7.8 10*3/uL (ref 4.0–10.5)

## 2016-07-14 LAB — CARBOXYHEMOGLOBIN
CARBOXYHEMOGLOBIN: 1.9 % — AB (ref 0.5–1.5)
Methemoglobin: 0.7 % (ref 0.0–1.5)
O2 Saturation: 71.1 %
Total hemoglobin: 11.9 g/dL — ABNORMAL LOW (ref 12.0–16.0)

## 2016-07-14 LAB — HEPARIN LEVEL (UNFRACTIONATED): HEPARIN UNFRACTIONATED: 0.2 [IU]/mL — AB (ref 0.30–0.70)

## 2016-07-14 MED ORDER — HEPARIN (PORCINE) IN NACL 100-0.45 UNIT/ML-% IJ SOLN
1400.0000 [IU]/h | INTRAMUSCULAR | Status: DC
  Administered 2016-07-14: 1000 [IU]/h via INTRAVENOUS
  Administered 2016-07-15: 1250 [IU]/h via INTRAVENOUS
  Administered 2016-07-16 – 2016-07-17 (×2): 1300 [IU]/h via INTRAVENOUS
  Administered 2016-07-18: 1400 [IU]/h via INTRAVENOUS
  Filled 2016-07-14 (×7): qty 250

## 2016-07-14 NOTE — Progress Notes (Signed)
   ANTICOAGULATION CONSULT NOTE - Follow Up Consult  Pharmacy Consult for Heparin Indication: atrial fibrillation  Allergies  Allergen Reactions  . Ambien [Zolpidem Tartrate] Other (See Comments)    Sleep walks  . Cholestatin   . Lipitor [Atorvastatin Calcium] Other (See Comments)    myalgias  . Ranitidine Other (See Comments)    Chest discomfort  . Simvastatin Other (See Comments)    Myalgias  . Xanax Xr [Alprazolam Er]     Tightness in chest  . Clopidogrel Bisulfate Rash    Patient Measurements: Height: 6\' 1"  (185.4 cm) Weight: 194 lb 7.1 oz (88.2 kg) IBW/kg (Calculated) : 79.9  Vital Signs: Temp: 98.2 F (36.8 C) (08/31 0834) Temp Source: Axillary (08/31 0834) BP: 116/73 (08/31 0834) Pulse Rate: 71 (08/31 0834)  Labs:  Recent Labs  07/12/16 0623 07/13/16 0500 07/14/16 0540  HGB 12.5* 11.4* 11.5*  HCT 37.1* 34.7* 34.8*  PLT 192 162 171  CREATININE 1.37* 1.34* 1.30*    Estimated Creatinine Clearance: 49.5 mL/min (by C-G formula based on SCr of 1.3 mg/dL).   Assessment: 83yom on heparin for afib (while coumadin on hold) in the setting of cardiogenic shock.   Heparin was turned off overnight d/t hematuria. Patient is now s/p carotid stent with orders to resume anticoagulation. Nurse has reported patient not having any hematuria this am - restart heparin.     Goal of Therapy:  Heparin level 0.3-0.5 units/ml - with hematuria Monitor platelets by anticoagulation protocol: Yes   Plan:  1) Restart heparin at 1000 units/hr 2) Heparin level in 8 hours after restart, CBC daily  Bonnita Nasuti Pharm.D. CPP, BCPS Clinical Pharmacist [phone] 07/14/2016 9:25 AM

## 2016-07-14 NOTE — Progress Notes (Signed)
   ANTICOAGULATION CONSULT NOTE - Follow Up Consult  Pharmacy Consult for Heparin Indication: atrial fibrillation  Allergies  Allergen Reactions  . Ambien [Zolpidem Tartrate] Other (See Comments)    Sleep walks  . Cholestatin   . Lipitor [Atorvastatin Calcium] Other (See Comments)    myalgias  . Ranitidine Other (See Comments)    Chest discomfort  . Simvastatin Other (See Comments)    Myalgias  . Xanax Xr [Alprazolam Er]     Tightness in chest  . Clopidogrel Bisulfate Rash    Patient Measurements: Height: 6\' 1"  (185.4 cm) Weight: 194 lb 7.1 oz (88.2 kg) IBW/kg (Calculated) : 79.9  Vital Signs: Temp: 98.8 F (37.1 C) (08/31 1546) Temp Source: Oral (08/31 1546) BP: 95/72 (08/31 1546) Pulse Rate: 67 (08/31 1546)  Labs:  Recent Labs  07/12/16 0623 07/13/16 0500 07/14/16 0540 07/14/16 1558  HGB 12.5* 11.4* 11.5*  --   HCT 37.1* 34.7* 34.8*  --   PLT 192 162 171  --   HEPARINUNFRC  --   --   --  0.20*  CREATININE 1.37* 1.34* 1.30*  --     Estimated Creatinine Clearance: 49.5 mL/min (by C-G formula based on SCr of 1.3 mg/dL).   Assessment: 83yom on heparin for afib (while coumadin on hold) in the setting of cardiogenic shock.  Heparin was turned off overnight d/t hematuria. Patient is now s/p carotid stent with orders to resume anticoagulation. Nurse has reported patient not having any hematuria this am - restart heparin.   Heparin level = 0.2, subtherapeutic, but drawn only ~ 5 hrs after infusion was started. Previously high-end therapeutic on 1300 units/hr    Goal of Therapy:  Heparin level 0.3-0.5 units/ml - with hematuria Monitor platelets by anticoagulation protocol: Yes   Plan:  - Increase heparin rate slightly to at 1100 units/hr - F/u AM heparin level  Maryanna Shape, PharmD, BCPS  Clinical Pharmacist  Pager: [phone]   07/14/2016 4:45 PM

## 2016-07-14 NOTE — Progress Notes (Signed)
CARDIAC REHAB PHASE I   PRE:  Rate/Rhythm: 68 afib    BP: sitting 105/49    SaO2: 97 RA  MODE:  Ambulation: 350 ft   POST:  Rate/Rhythm: 99 afib    BP: sitting 113/65     SaO2: 100 RA  Pt eager to walk. Able to stand and walk with RW, assist x2 with gait belt. Fairly steady today, no c/o, fairly quick pace. To recliner. Happy to walk. [place], ACSM 07/14/2016 2:23 PM

## 2016-07-14 NOTE — Progress Notes (Signed)
Urology Inpatient Progress Report  CHF (congestive heart failure), NYHA class I, acute on chronic, combined (HCC) [I50.43]  Procedure(s): Carotid PTA/Stent Intervention  3 Days Post-Op   Intv/Subj: No acute events overnight. Patient is without complaint.  Yesterday there was issues with his catheter after he got to a chair.  The balloon was deflated and then catheter placed further in.  His symptoms improved.  Hematuria appears to result.  Principal Problem:   Cardiogenic shock (HCC) Active Problems:   Hypertension   Cerebrovascular disease   Atrial fibrillation ([place])   Hyperlipidemia   Aortic stenosis   Diabetes mellitus (HCC)   Carotid stenosis   Anemia of chronic disease   Systolic congestive heart failure (HCC)   Dyspnea   CAD (coronary artery disease), native coronary artery   Acute on chronic combined systolic and diastolic CHF, NYHA class 4 (HCC)   Acute respiratory failure with hypoxia (HCC)  Current Facility-Administered Medications  Medication Dose Route Frequency Provider Last Rate Last Dose  . 0.9 %  sodium chloride infusion  250 mL Intravenous PRN Roney Jaffe, MD 10 mL/hr at 07/11/16 1541 250 mL at 07/11/16 1541  . 0.9 %  sodium chloride infusion   Intravenous Continuous Debbrah Alar, PA-C 50 mL/hr at 07/12/16 1715 50 mL/hr at 07/12/16 1715  . acetaminophen (TYLENOL) suppository 650 mg  650 mg Rectal Q6H PRN Roney Jaffe, MD      . acetaminophen (TYLENOL) tablet 650 mg  650 mg Oral Q4H PRN Lorretta Harp, MD      . amiodarone (PACERONE) tablet 200 mg  200 mg Oral BID Amy D Clegg, NP   200 mg at 07/14/16 1020  . aspirin chewable tablet 81 mg  81 mg Oral Daily Larey Dresser, MD   81 mg at 07/14/16 1020  . bisacodyl (DULCOLAX) suppository 10 mg  10 mg Rectal Daily PRN Roney Jaffe, MD   10 mg at 06/30/16 0834  . ciprofloxacin (CIPRO) tablet 500 mg  500 mg Oral BID Satira Mccallum Tillery, PA-C   500 mg at 07/14/16 1019  . digoxin (LANOXIN) tablet 0.125 mg   0.125 mg Oral Daily Satira Mccallum Tillery, PA-C   0.125 mg at 07/14/16 1019  . diphenhydrAMINE (BENADRYL) capsule 25 mg  25 mg Oral Q8H PRN Amy D Clegg, NP   25 mg at 07/14/16 1748  . docusate sodium (COLACE) capsule 100 mg  100 mg Oral BID Amy D Clegg, NP   100 mg at 07/14/16 1019  . furosemide (LASIX) tablet 40 mg  40 mg Oral Daily Jolaine Artist, MD   40 mg at 07/14/16 1020  . heparin ADULT infusion 100 units/mL (25000 units/262mL sodium chloride 0.45%)  1,100 Units/hr Intravenous Continuous Jolaine Artist, MD 11 mL/hr at 07/14/16 1700 1,100 Units/hr at 07/14/16 1700  . HYDROcodone-acetaminophen (NORCO/VICODIN) 5-325 MG per tablet 1 tablet  1 tablet Oral TID PRN Roney Jaffe, MD   1 tablet at 07/14/16 1748  . hydrocortisone cream 1 %   Topical BID PRN Jani Gravel, MD      . insulin aspart (novoLOG) injection 0-9 Units  0-9 Units Subcutaneous TID WC Roney Jaffe, MD   3 Units at 07/14/16 1318  . loratadine (CLARITIN) tablet 10 mg  10 mg Oral Daily Clayborne Dana, MD   10 mg at 07/14/16 1020  . milrinone (PRIMACOR) 20 MG/100 ML (0.2 mg/mL) infusion  0.25 mcg/kg/min Intravenous Continuous Shirley Friar, PA-C 7.2 mL/hr at 07/14/16 0340 0.25 mcg/kg/min at 07/14/16  0340  . morphine 2 MG/ML injection 2 mg  2 mg Intravenous Q1H PRN Lorretta Harp, MD   2 mg at 07/14/16 1703  . nortriptyline (PAMELOR) capsule 10 mg  10 mg Oral QHS PRN Roney Jaffe, MD   10 mg at 07/04/16 0015  . ondansetron (ZOFRAN) tablet 4 mg  4 mg Oral Q6H PRN Roney Jaffe, MD       Or  . ondansetron [facility]) injection 4 mg  4 mg Intravenous Q6H PRN Roney Jaffe, MD      . opium-belladonna (B&O SUPPRETTES) 16.2-60 MG suppository 1 suppository  1 suppository Rectal Q8H PRN Franchot Gallo, MD   1 suppository at 07/14/16 1703  . polyethylene glycol (MIRALAX / GLYCOLAX) packet 17 g  17 g Oral Daily PRN Satira Mccallum Tillery, PA-C      . rosuvastatin (CRESTOR) tablet 5 mg  5 mg Oral q1800 Larey Dresser, MD    5 mg at 07/14/16 1748  . sodium chloride flush (NS) 0.9 % injection 10-40 mL  10-40 mL Intracatheter Q12H Shirley Friar, PA-C   10 mL at 07/13/16 2120  . sodium chloride flush (NS) 0.9 % injection 10-40 mL  10-40 mL Intracatheter PRN Satira Mccallum Tillery, PA-C   10 mL at 07/09/16 1006  . sodium chloride flush (NS) 0.9 % injection 3 mL  3 mL Intravenous Q12H Roney Jaffe, MD   3 mL at 07/13/16 2120  . sodium chloride flush (NS) 0.9 % injection 3 mL  3 mL Intravenous PRN Roney Jaffe, MD      . tamsulosin [facility]) capsule 0.4 mg  0.4 mg Oral Daily Amanda Dancy, PA-C   0.4 mg at 07/14/16 1019  . traZODone (DESYREL) tablet 100 mg  100 mg Oral QHS Roney Jaffe, MD   100 mg at 07/13/16 2119     Objective: Vital: Vitals:   07/14/16 0600 07/14/16 0834 07/14/16 1206 07/14/16 1546  BP: (!) 111/51 116/73 (!) 117/41 95/72  Pulse: 72 71 62 67  Resp: 16 18 (!) 21 14  Temp:  98.2 F (36.8 C) 98.6 F (37 C) 98.8 F (37.1 C)  TempSrc:  Axillary Oral Oral  SpO2: 94% 97% 97% 98%  Weight:      Height:       I/Os: I/O last 3 completed shifts: In: 6102 [I.V.:602; Other:5500] Out: 8335 [Urine:8335]  Physical Exam:  General: Patient is in no apparent distress  The Foley catheter is draining clear urine with CBI turned off.  Lab Results:  Recent Labs  07/12/16 0623 07/13/16 0500 07/14/16 0540  WBC 8.8 7.3 7.8  HGB 12.5* 11.4* 11.5*  HCT 37.1* 34.7* 34.8*    Recent Labs  07/12/16 0623 07/13/16 0500 07/14/16 0540  NA 129* 132* 132*  K 4.1 3.8 3.8  CL 94* 97* 97*  CO2 26 27 26   GLUCOSE 233* 205* 185*  BUN 19 18 16   CREATININE 1.37* 1.34* 1.30*  CALCIUM 8.9 8.9 9.0   No results for input(s): LABPT, INR in the last 72 hours. No results for input(s): LABURIN in the last 72 hours. Results for orders placed or performed during the hospital encounter of 06/28/16  MRSA PCR Screening     Status: None   Collection Time: 06/29/16  2:15 AM  Result Value Ref Range Status    MRSA by PCR NEGATIVE NEGATIVE Final    Comment:        The GeneXpert MRSA Assay (FDA approved for NASAL specimens only), is one component  of a comprehensive MRSA colonization surveillance program. It is not intended to diagnose MRSA infection nor to guide or monitor treatment for MRSA infections.   Urine culture     Status: Abnormal   Collection Time: 07/11/16  2:26 AM  Result Value Ref Range Status   Specimen Description URINE, RANDOM  Final   Special Requests NONE  Final   Culture MULTIPLE SPECIES PRESENT, SUGGEST RECOLLECTION (A)  Final   Report Status 07/12/2016 FINAL  Final    Studies/Results: No results found.  Assessment: Procedure(s): Carotid PTA/Stent Intervention, 3 Days Post-Op  Gross hematuria likely secondary to a urinary tract infection boosted by his anti-coagulants.  This appears to have improved. Urinary retention secondary to postoperative variables.  Expect once he is more ambulatory and able to just enough voiding process he will pass a voiding trial.  Plan: I recommend initiating a voiding trial today.  The patient fails a voiding trial he'll need a catheter replaced.  We tried again in about one week.  Continue Flomax.  Please contact me with any questions or concerns.   Louis Meckel, MD Urology 07/14/2016, 5:56 PM

## 2016-07-14 NOTE — Progress Notes (Signed)
Patient ID: [name] [name], male   DOB: 1931/12/19, 82 y.o.   MRN: [id number]   Advanced Heart Failure Rounding Note   Subjective:    Developed frank hematuria 07/10/16. Heparin stopped. UA and urine cytology sent. UA + for blood and WBCs.   S/p R Carotid stent 07/11/16/   Coox 71.1% this am on milrinone 0.25 mcg/kg/min. CVP 4-5  Urology saw 07/12/16 for hematuria/urinary retention. Thought to be likely due to infection. Cipro x 7 days + flomax. Recommended stool regimen as well.   Currently undergoing bladder irrigation.   Had acute "groin" pain 07/13/16. Unrelieved from morphine and opioid belladonna suppository. Required fentanyl and versed.   Per urology CBI balloon deflated and repositioned. Pt had prompt return of > 1000 cc of fluid.   Feels good this morning. CBI still causing some discomfort, but "nothing compared to yesterday".  Still having some dyspnea with mild exertion.   TEE (8/17): EF 30-35%, severe AS, moderate to severe MR (suspect functional MR).   Carotid dopplers > 80% RICA stenosis.  CTA head/neck with >90% (critical) RICA stenosis and [id number] LICA.   RHC/LHC 06/17/16 RA 11 PCWP 25  CO/CI 3.25/1.4   Ost Cx to Prox Cx lesion, 50 %stenosed.  Mid Cx to Dist Cx lesion, 80 %stenosed.  Mid RCA lesion, 40 %stenosed.  Mid LAD lesion, 75 %stenosed.  2nd Diag lesion, 75 %stenosed.  Hemodynamic findings consistent with moderate pulmonary hypertension.  Unable to cross the aortic valve.  PA sat 52%. CO 3.2 L/min.   Objective:   Weight Range:  Vital Signs:   Temp:  [97.3 F (36.3 C)-97.8 F (36.6 C)] 97.5 F (36.4 C) (08/31 0413) Pulse Rate:  [66-87] 72 (08/31 0600) Resp:  [4-29] 16 (08/31 0600) BP: (101-131)/(38-66) 111/51 (08/31 0600) SpO2:  [94 %-98 %] 94 % (08/31 0600) Weight:  [194 lb 7.1 oz (88.2 kg)] 194 lb 7.1 oz (88.2 kg) (08/31 0413) Last BM Date: 07/11/16  Weight change: Filed Weights   07/12/16 0500 07/13/16 0303 07/14/16 0413  Weight: 202 lb  13.2 oz (92 kg) 200 lb 9.9 oz (91 kg) 194 lb 7.1 oz (88.2 kg)    Intake/Output:   Intake/Output Summary (Last 24 hours) at 07/14/16 0747 Last data filed at 07/14/16 0655  Gross per 24 hour  Intake           4015.6 ml  Output             5085 ml  Net          -1069.4 ml     Physical Exam: CVP 4-5 General:  NAD. Elderly. Sitting in bed  HEENT: Normal Neck: supple. JVP flat Carotids 2+ bilat; no bruits. No thyromegaly or nodule noted. Cor: PMI nondisplaced. Irregularly irregular No S3.  2/6 systolic crescendo-decrescendo murmur with muffling of S2.   Lungs: CTAB, normal effort Abdomen: soft, NT, ND, no HSM. No bruits or masses. +BS  Extremities: no cyanosis, clubbing, rash, no edema.  Neuro: alert & orientedx3, cranial nerves grossly intact. moves all 4 extremities w/o difficulty. Affect pleasant  Telemetry: Reviewed personally, Afib 70s  Labs: Basic Metabolic Panel:  Recent Labs Lab 07/10/16 0518 07/11/16 0412 07/12/16 0623 07/13/16 0500 07/14/16 0540  NA 131* 130* 129* 132* 132*  K 4.0 3.9 4.1 3.8 3.8  CL 94* 93* 94* 97* 97*  CO2 27 28 26 27 26   GLUCOSE 193* 241* 233* 205* 185*  BUN 24* 20 19 18 16   CREATININE 1.37* 1.25* 1.37* 1.34*  1.30*  CALCIUM 9.4 9.3 8.9 8.9 9.0  MG 2.0  --   --   --   --     Liver Function Tests: No results for input(s): AST, ALT, ALKPHOS, BILITOT, PROT, ALBUMIN in the last 168 hours. No results for input(s): LIPASE, AMYLASE in the last 168 hours. No results for input(s): AMMONIA in the last 168 hours.  CBC:  Recent Labs Lab 07/10/16 0518 07/11/16 0412 07/12/16 0623 07/13/16 0500 07/14/16 0540  WBC 7.6 6.8 8.8 7.3 7.8  HGB 14.6 13.8 12.5* 11.4* 11.5*  HCT 42.6 41.3 37.1* 34.7* 34.8*  MCV 88.0 89.2 88.5 89.0 89.9  PLT 220 207 192 162 171    Cardiac Enzymes: No results for input(s): CKTOTAL, CKMB, CKMBINDEX, TROPONINI in the last 168 hours.  BNP: BNP (last 3 results)  Recent Labs  05/03/16 1305 05/28/16 1617  06/28/16 1316  BNP 232.0* 294.0* 222.0*    ProBNP (last 3 results) No results for input(s): PROBNP in the last 8760 hours.    Other results:  Imaging: No results found.   Medications:     Scheduled Medications: . amiodarone  200 mg Oral BID  . aspirin  81 mg Oral Daily  . ciprofloxacin  500 mg Oral BID  . digoxin  0.125 mg Oral Daily  . docusate sodium  100 mg Oral BID  . furosemide  40 mg Oral Daily  . insulin aspart  0-9 Units Subcutaneous TID WC  . loratadine  10 mg Oral Daily  . rosuvastatin  5 mg Oral q1800  . sodium chloride flush  10-40 mL Intracatheter Q12H  . sodium chloride flush  3 mL Intravenous Q12H  . tamsulosin  0.4 mg Oral Daily  . traZODone  100 mg Oral QHS    Infusions: . sodium chloride 50 mL/hr (07/12/16 1715)  . milrinone 0.25 mcg/kg/min (07/14/16 0340)    PRN Medications: sodium chloride, [DISCONTINUED] acetaminophen **OR** acetaminophen, acetaminophen, bisacodyl, diphenhydrAMINE, HYDROcodone-acetaminophen, hydrocortisone cream, morphine injection, nortriptyline, ondansetron **OR** ondansetron (ZOFRAN) IV, opium-belladonna, polyethylene glycol, sodium chloride flush, sodium chloride flush   Assessment:   1. Cardiogenic Shock- TEE with EF 30-35% 2. Acute Respiratory Failure 3. A fib RVR 4. CAD -  2 vessel disease 5. Valvular Heart Disease-  Severe AS/moderate-severe MR (MR may be functional).  6. Caroltid Stenosis- Severe carotid stenosis RICA >80% stenosis by carotid dopplers, CTA head/neck with critical >90% RICA stenosis an [id number] LICA.     --s/p R carotid stent 8/28 7. Bilateral Effusions  8. Nasopharyngeal mass 9. Rash- 10. Hyperkalemia 11. Hematuria 12. UTI  Plan/Discussion:    S/p R carotid stent 07/11/16. Continue ASA (allergic to plavix and too high risk for Brilinta.  CVP 4-5. Coox remains stable on milrinone 0.25.   Volume status and creatinine remain stable on po lasix 40 daily.   Remains in Afib with good rate control.  Continue amio 200 mg BID. NSVT quiescent. Electrolytes stable. Resume heparin today with NO BOLUS.   CT 07/04/16 with bilateral kidney lesions. Felt to be simple cysts.     Appreciate urology input.  Voiding trial recommended for Today. Will complete 7 day course of Cipro. Continue flomax.  Continue bowel regimen.  Still slightly uncomfortable with CBI. Urine remains clear.   2 vessel CAD, on ASA 81 and low dose Crestor (prior myalgias with simvastatin).   No BB with low output. Will eventually consider ACEi.   He is too high risk for conventional surgery (low gradient severe AS and moderate to  severe probably functional MR).   Continue PT.   Dr Burt Knack saw last night. Continue with plan for TAVR Tuesday 07/19/16.  Length of Stay: [address]  Annamaria Helling 07/14/2016, 7:47 AM  Advanced Heart Failure Team Pager [phone] (M-F; 7a - 4p)  Please contact [facility] for night-coverage after hours (4p -7a ) and weekends on [email or web address]    Patient seen and examined with Oda Kilts, PA-C. We discussed all aspects of the encounter. I agree with the assessment and plan as stated above.   Feels better today. Hematuria resolved. Will begin voiding trial today.   Co-ox and volume status remain stable on milrinone. Renal function looks good. Will continue current regimen. Restart heparin today for AF.   On ASA for carotid stent. No Plavix due to allergy.   Continue ambulation.   Plan TAVR Tuesday with possible enrollment in the ENVISAGE-AF trial (warfarin vs Sayvasa).   Agatha Duplechain,MD 8:38 AM

## 2016-07-15 ENCOUNTER — Encounter (HOSPITAL_COMMUNITY): Payer: Medicare Other

## 2016-07-15 ENCOUNTER — Encounter: Payer: Medicare Other | Admitting: Vascular Surgery

## 2016-07-15 DIAGNOSIS — I472 Ventricular tachycardia: Secondary | ICD-10-CM

## 2016-07-15 LAB — CBC
HCT: 35.4 % — ABNORMAL LOW (ref 39.0–52.0)
Hemoglobin: 11.8 g/dL — ABNORMAL LOW (ref 13.0–17.0)
MCH: 29.5 pg (ref 26.0–34.0)
MCHC: 33.3 g/dL (ref 30.0–36.0)
MCV: 88.5 fL (ref 78.0–100.0)
PLATELETS: 186 10*3/uL (ref 150–400)
RBC: 4 MIL/uL — ABNORMAL LOW (ref 4.22–5.81)
RDW: 14.3 % (ref 11.5–15.5)
WBC: 6.2 10*3/uL (ref 4.0–10.5)

## 2016-07-15 LAB — CARBOXYHEMOGLOBIN
CARBOXYHEMOGLOBIN: 1.3 % (ref 0.5–1.5)
Carboxyhemoglobin: 1.6 % — ABNORMAL HIGH (ref 0.5–1.5)
Methemoglobin: 0.7 % (ref 0.0–1.5)
Methemoglobin: 0.8 % (ref 0.0–1.5)
O2 SAT: 53.1 %
O2 Saturation: 70.4 %
TOTAL HEMOGLOBIN: 10.7 g/dL — AB (ref 12.0–16.0)
Total hemoglobin: 10.5 g/dL — ABNORMAL LOW (ref 12.0–16.0)

## 2016-07-15 LAB — BASIC METABOLIC PANEL
Anion gap: 13 (ref 5–15)
Anion gap: 13 (ref 5–15)
BUN: 18 mg/dL (ref 6–20)
BUN: 16 mg/dL (ref 6–20)
CHLORIDE: 98 mmol/L — AB (ref 101–111)
CO2: 23 mmol/L (ref 22–32)
CO2: 23 mmol/L (ref 22–32)
CREATININE: 1.66 mg/dL — AB (ref 0.61–1.24)
Calcium: 9.3 mg/dL (ref 8.9–10.3)
Calcium: 7.6 mg/dL — ABNORMAL LOW (ref 8.9–10.3)
Chloride: 91 mmol/L — ABNORMAL LOW (ref 101–111)
Creatinine, Ser: 1.49 mg/dL — ABNORMAL HIGH (ref 0.61–1.24)
GFR calc Af Amer: 43 mL/min — ABNORMAL LOW (ref >60)
GFR calc Af Amer: 49 mL/min — ABNORMAL LOW (ref >60)
GFR calc non Af Amer: 37 mL/min — ABNORMAL LOW (ref >60)
GFR calc non Af Amer: 42 mL/min — ABNORMAL LOW (ref >60)
Glucose, Bld: 141 mg/dL — ABNORMAL HIGH (ref 65–99)
Glucose, Bld: 526 mg/dL (ref 65–99)
Potassium: 3.6 mmol/L (ref 3.5–5.1)
Potassium: 3.3 mmol/L — ABNORMAL LOW (ref 3.5–5.1)
Sodium: 134 mmol/L — ABNORMAL LOW (ref 135–145)
Sodium: 127 mmol/L — ABNORMAL LOW (ref 135–145)

## 2016-07-15 LAB — GLUCOSE, CAPILLARY
Glucose-Capillary: 198 mg/dL — ABNORMAL HIGH (ref 65–99)
Glucose-Capillary: 219 mg/dL — ABNORMAL HIGH (ref 65–99)
Glucose-Capillary: 164 mg/dL — ABNORMAL HIGH (ref 65–99)
Glucose-Capillary: 201 mg/dL — ABNORMAL HIGH (ref 65–99)

## 2016-07-15 LAB — HEPARIN LEVEL (UNFRACTIONATED)
Heparin Unfractionated: 0.18 IU/mL — ABNORMAL LOW (ref 0.30–0.70)
Heparin Unfractionated: 0.32 IU/mL (ref 0.30–0.70)

## 2016-07-15 LAB — MAGNESIUM: Magnesium: 2 mg/dL (ref 1.7–2.4)

## 2016-07-15 LAB — DIGOXIN LEVEL: Digoxin Level: 1.4 ng/mL (ref 0.8–2.0)

## 2016-07-15 MED ORDER — POTASSIUM CHLORIDE 10 MEQ/50ML IV SOLN
10.0000 meq | INTRAVENOUS | Status: AC
Start: 2016-07-15 — End: 2016-07-15
  Administered 2016-07-15 (×4): 10 meq via INTRAVENOUS

## 2016-07-15 MED ORDER — AMIODARONE HCL IN DEXTROSE 360-4.14 MG/200ML-% IV SOLN
30.0000 mg/h | INTRAVENOUS | Status: DC
  Administered 2016-07-15 – 2016-07-19 (×8): 30 mg/h via INTRAVENOUS
  Filled 2016-07-15 (×8): qty 200

## 2016-07-15 MED ORDER — MAGNESIUM SULFATE 2 GM/50ML IV SOLN
INTRAVENOUS | Status: AC
  Administered 2016-07-15: 2 g via INTRAVENOUS
  Filled 2016-07-15: qty 50

## 2016-07-15 MED ORDER — MAGNESIUM SULFATE 2 GM/50ML IV SOLN
2.0000 g | Freq: Once | INTRAVENOUS | Status: AC
Start: 2016-07-15 — End: 2016-07-15
  Administered 2016-07-15: 2 g via INTRAVENOUS

## 2016-07-15 MED ORDER — POTASSIUM CHLORIDE CRYS ER 20 MEQ PO TBCR
40.0000 meq | EXTENDED_RELEASE_TABLET | Freq: Two times a day (BID) | ORAL | Status: AC
  Administered 2016-07-15 (×2): 40 meq via ORAL
  Filled 2016-07-15 (×2): qty 2

## 2016-07-15 MED ORDER — AMIODARONE LOAD VIA INFUSION
150.0000 mg | Freq: Once | INTRAVENOUS | Status: AC
  Administered 2016-07-15: 150 mg via INTRAVENOUS
  Filled 2016-07-15: qty 83.34

## 2016-07-15 MED ORDER — POTASSIUM CHLORIDE 10 MEQ/50ML IV SOLN
INTRAVENOUS | Status: AC
  Administered 2016-07-15: 10 meq via INTRAVENOUS
  Filled 2016-07-15: qty 200

## 2016-07-15 MED ORDER — AMIODARONE HCL IN DEXTROSE 360-4.14 MG/200ML-% IV SOLN
60.0000 mg/h | INTRAVENOUS | Status: AC
Start: 2016-07-15 — End: 2016-07-15
  Administered 2016-07-15: 60 mg/h via INTRAVENOUS
  Filled 2016-07-15 (×2): qty 200

## 2016-07-15 NOTE — Progress Notes (Signed)
Patient ID: [name] [name], male   DOB: 07/12/1932, 82 y.o.   MRN: [id number]   Advanced Heart Failure Rounding Note   Subjective:    Developed frank hematuria 07/10/16. Heparin stopped. UA and urine cytology sent. UA + for blood and WBCs.   S/p R Carotid stent 07/11/16/   Coox 71.1% this am on milrinone 0.25 mcg/kg/min. CVP 4-5  Urology saw 07/12/16 for hematuria/urinary retention. Thought to be likely due to infection. Cipro x 7 days + flomax. Recommended stool regimen as well.   Currently undergoing bladder irrigation.   Had acute "groin" pain 07/13/16. Unrelieved from morphine and opioid belladonna suppository. Required fentanyl and versed.   Per urology CBI balloon deflated and repositioned. Pt had prompt return of > 1000 cc of fluid.   Began having VT this morning with palpitations and near-syncope. K supp ordered, Amio bolus + infusion restarted, 2 g Mg given empirically.   Pt had several more episodes but ectopy lessened as meds received.    Feeling slightly more weak and anxious this morning with VT.  CBI Foley out. Peeing well. No hematuria. Back on heparin.   TEE (8/17): EF 30-35%, severe AS, moderate to severe MR (suspect functional MR).   Carotid dopplers > 80% RICA stenosis.  CTA head/neck with >90% (critical) RICA stenosis and [id number] LICA.   RHC/LHC 06/17/16 RA 11 PCWP 25  CO/CI 3.25/1.4   Ost Cx to Prox Cx lesion, 50 %stenosed.  Mid Cx to Dist Cx lesion, 80 %stenosed.  Mid RCA lesion, 40 %stenosed.  Mid LAD lesion, 75 %stenosed.  2nd Diag lesion, 75 %stenosed.  Hemodynamic findings consistent with moderate pulmonary hypertension.  Unable to cross the aortic valve.  PA sat 52%. CO 3.2 L/min.   Objective:   Weight Range:  Vital Signs:   Temp:  [97.9 F (36.6 C)-98.8 F (37.1 C)] 98 F (36.7 C) (09/01 0350) Pulse Rate:  [25-76] 28 (09/01 0745) Resp:  [14-40] 32 (09/01 0745) BP: (85-120)/(32-73) 96/32 (09/01 0745) SpO2:  [97 %-100 %] 100 % (09/01  0745) Weight:  [205 lb 8 oz (93.2 kg)] 205 lb 8 oz (93.2 kg) (09/01 0402) Last BM Date: 07/14/16  Weight change: Filed Weights   07/13/16 0303 07/14/16 0413 07/15/16 0402  Weight: 200 lb 9.9 oz (91 kg) 194 lb 7.1 oz (88.2 kg) 205 lb 8 oz (93.2 kg)    Intake/Output:   Intake/Output Summary (Last 24 hours) at 07/15/16 0808 Last data filed at 07/15/16 0745  Gross per 24 hour  Intake          3691.96 ml  Output             1950 ml  Net          1741.96 ml     Physical Exam: CVP 4-5 General:  NAD. Elderly. Sitting in bed  HEENT: Normal Neck: supple. JVP flat Carotids 2+ bilat; no bruits. No thyromegaly or nodule noted. Cor: PMI nondisplaced. Irregularly irregular No S3.  2/6 systolic crescendo-decrescendo murmur with muffling of S2.   Lungs: CTAB, normal effort Abdomen: soft, NT, ND, no HSM. No bruits or masses. +BS  Extremities: no cyanosis, clubbing, rash, no edema.  Neuro: alert & orientedx3, cranial nerves grossly intact. moves all 4 extremities w/o difficulty. Affect pleasant  Telemetry: Reviewed, Afib 70s primarily. Several episodes of VT this morning up to rates of 140  Labs: Basic Metabolic Panel:  Recent Labs Lab 07/10/16 0518 07/11/16 0412 07/12/16 0623 07/13/16 0500 07/14/16 0540 07/15/16  0319  NA 131* 130* 129* 132* 132* 134*  K 4.0 3.9 4.1 3.8 3.8 3.6  CL 94* 93* 94* 97* 97* 98*  CO2 27 28 26 27 26 23   GLUCOSE 193* 241* 233* 205* 185* 141*  BUN 24* 20 19 18 16 18   CREATININE 1.37* 1.25* 1.37* 1.34* 1.30* 1.66*  CALCIUM 9.4 9.3 8.9 8.9 9.0 9.3  MG 2.0  --   --   --   --   --     Liver Function Tests: No results for input(s): AST, ALT, ALKPHOS, BILITOT, PROT, ALBUMIN in the last 168 hours. No results for input(s): LIPASE, AMYLASE in the last 168 hours. No results for input(s): AMMONIA in the last 168 hours.  CBC:  Recent Labs Lab 07/11/16 0412 07/12/16 0623 07/13/16 0500 07/14/16 0540 07/15/16 0319  WBC 6.8 8.8 7.3 7.8 6.2  HGB 13.8 12.5*  11.4* 11.5* 11.8*  HCT 41.3 37.1* 34.7* 34.8* 35.4*  MCV 89.2 88.5 89.0 89.9 88.5  PLT 207 192 162 171 186    Cardiac Enzymes: No results for input(s): CKTOTAL, CKMB, CKMBINDEX, TROPONINI in the last 168 hours.  BNP: BNP (last 3 results)  Recent Labs  05/03/16 1305 05/28/16 1617 06/28/16 1316  BNP 232.0* 294.0* 222.0*    ProBNP (last 3 results) No results for input(s): PROBNP in the last 8760 hours.    Other results:  Imaging: No results found.   Medications:     Scheduled Medications: . aspirin  81 mg Oral Daily  . ciprofloxacin  500 mg Oral BID  . digoxin  0.125 mg Oral Daily  . docusate sodium  100 mg Oral BID  . furosemide  40 mg Oral Daily  . insulin aspart  0-9 Units Subcutaneous TID WC  . loratadine  10 mg Oral Daily  . magnesium sulfate 1 - 4 g bolus IVPB  2 g Intravenous Once  . potassium chloride  10 mEq Intravenous Q1 Hr x 4  . rosuvastatin  5 mg Oral q1800  . sodium chloride flush  10-40 mL Intracatheter Q12H  . sodium chloride flush  3 mL Intravenous Q12H  . tamsulosin  0.4 mg Oral Daily  . traZODone  100 mg Oral QHS    Infusions: . sodium chloride 50 mL/hr at 07/14/16 2000  . amiodarone 60 mg/hr (07/15/16 0745)   Followed by  . amiodarone    . heparin 1,250 Units/hr (07/15/16 0414)  . milrinone 0.25 mcg/kg/min (07/14/16 2000)    PRN Medications: sodium chloride, [DISCONTINUED] acetaminophen **OR** acetaminophen, acetaminophen, bisacodyl, diphenhydrAMINE, HYDROcodone-acetaminophen, hydrocortisone cream, morphine injection, nortriptyline, ondansetron **OR** ondansetron (ZOFRAN) IV, opium-belladonna, polyethylene glycol, sodium chloride flush, sodium chloride flush   Assessment:   1. Cardiogenic Shock- TEE with EF 30-35% 2. Acute Respiratory Failure 3. A fib RVR 4. CAD -  2 vessel disease 5. Valvular Heart Disease-  Severe AS/moderate-severe MR (MR may be functional).  6. Caroltid Stenosis- Severe carotid stenosis RICA >80% stenosis by  carotid dopplers, CTA head/neck with critical >90% RICA stenosis an [id number] LICA.     --s/p R carotid stent 8/28 7. Bilateral Effusions  8. Nasopharyngeal mass 9. Rash- 10. Hyperkalemia 11. Hematuria 12. UTI 13. VT  Plan/Discussion:    S/p R carotid stent 07/11/16. Continue ASA (allergic to plavix and too high risk for Brilinta.  CVP 6-7. Coox 53% this morning but in the setting of frequent ectopy + VT.  Now more quiescent with electrolyte supp and back on amio gtt.   Will recheck coox  this afternoon.    Volume status stable. Creatinine slightly up this am. BUN stable.   Remains in Afib with good rate control. Continue amio 200 mg BID. NSVT quiescent. Electrolytes stable. Resume heparin today with NO BOLUS.   CT 07/04/16 with bilateral kidney lesions. Felt to be simple cysts.     Appreciate urology input.  Voiding trial ongoing. Good output so far. Will complete 7 day course of Cipro. Continue flomax and bowel regimen.   2 vessel CAD, on ASA 81 and low dose Crestor (prior myalgias with simvastatin).   No BB with low output. Will eventually consider ACEi.   He is too high risk for conventional surgery (low gradient severe AS and moderate to severe probably functional MR).   Continue PT.   Continue with plan for TAVR Tuesday 07/19/16.  Length of Stay: [place], [place] 07/15/2016, 8:08 AM  Advanced Heart Failure Team Pager [phone] (M-F; 7a - 4p)  Please contact [facility] for night-coverage after hours (4p -7a ) and weekends on [email or web address]  Patient seen and examined with Oda Kilts, PA-C. We discussed all aspects of the encounter. I agree with the assessment and plan as stated above.   Had multiple episodes of sustained and NSVT this am. Very symptomatic. No CP. Co-ox down. Moved to ICU. Now back on amio gtt. Electrolytes being supped. Milrinone decreased slightly.    Continue heparin for AF. Rate controlled.   CBI catheter removed last night and making good  urine. On abx for UTI.   Volume status ok.   Scheduled for TAVR on Tuesday.   The patient is critically ill with multiple organ systems failure and requires high complexity decision making for assessment and support, frequent evaluation and titration of therapies, application of advanced monitoring technologies and extensive interpretation of multiple databases.   Critical Care Time devoted to patient care services described in this note is 35 Minutes.  Adryan Shin,MD 8:48 AM

## 2016-07-15 NOTE — Care Management Important Message (Signed)
Important Message  Patient Details  Name: [name] [name] MRN: [id number] Date of Birth: Dec 16, 1931   Medicare Important Message Given:  Yes    [name] 07/15/2016, 9:56 AM

## 2016-07-15 NOTE — Progress Notes (Signed)
Rec'd a call from RN re: bradycardia and BP running lower than usual.  Pt in atrial fib and HR 50s (new). SBP 90s, has been there before but there is concern because the HR is also low.   Pt is on Lasix 40 mg po qd. No other BP meds. CVP 11. He is on Dig at 0.125 mg qd and amio at 30 mg/hr.  Spoke w/ Dr Gwenlyn Found. Will ck stat dig level and d/c dig. If dig level comes back low-nl or low, will also d/c amiodarone.  Because of hx VT, and ongoing PVCs, keep amio going for now.  Lenoard Aden 07/15/2016 5:43 PM Beeper [phone]

## 2016-07-15 NOTE — Progress Notes (Signed)
TCTS BRIEF PROGRESS NOTE   Events of last 24 hours noted.  Episodes of NSVT with near syncope.  Now on IV amiodarone Still tentatively planning for TAVR next Tuesday if he remains stable.  Rexene Alberts, MD 07/15/2016 3:20 PM

## 2016-07-15 NOTE — Progress Notes (Signed)
ANTICOAGULATION CONSULT NOTE - Follow Up Consult  Pharmacy Consult for Heparin  Indication: atrial fibrillation  Allergies  Allergen Reactions  . Ambien [Zolpidem Tartrate] Other (See Comments)    Sleep walks  . Cholestatin   . Lipitor [Atorvastatin Calcium] Other (See Comments)    myalgias  . Ranitidine Other (See Comments)    Chest discomfort  . Simvastatin Other (See Comments)    Myalgias  . Xanax Xr [Alprazolam Er]     Tightness in chest  . Clopidogrel Bisulfate Rash    Patient Measurements: Height: 6\' 1"  (185.4 cm) Weight: 205 lb 8 oz (93.2 kg) IBW/kg (Calculated) : 79.9   Vital Signs: Temp: 97.1 F (36.2 C) (09/01 1100) Temp Source: Axillary (09/01 1100) BP: 104/49 (09/01 1100) Pulse Rate: 74 (09/01 1100)  Labs:  Recent Labs  07/13/16 0500 07/14/16 0540 07/14/16 1558 07/15/16 0319 07/15/16 1200  HGB 11.4* 11.5*  --  11.8*  --   HCT 34.7* 34.8*  --  35.4*  --   PLT 162 171  --  186  --   HEPARINUNFRC  --   --  0.20* 0.18* 0.32  CREATININE 1.34* 1.30*  --  1.66* 1.49*    Estimated Creatinine Clearance: 43.2 mL/min (by C-G formula based on SCr of 1.49 mg/dL).   Assessment: Heparin for afib while warfarin on hold.   Heparin level now at low end of goal after rate adjustment this morning. Hematuria appears resolved. CBC remains stable.   Goal of Therapy:  Heparin level 0.3-0.5 units/ml Monitor platelets by anticoagulation protocol: Yes   Plan:  -Increase heparin to 1300 units/hr -Daily HL/CBC  Erin Hearing PharmD., BCPS Clinical Pharmacist Pager [phone] 07/15/2016 1:04 PM

## 2016-07-15 NOTE — Progress Notes (Signed)
ANTICOAGULATION CONSULT NOTE - Follow Up Consult  Pharmacy Consult for Heparin  Indication: atrial fibrillation  Allergies  Allergen Reactions  . Ambien [Zolpidem Tartrate] Other (See Comments)    Sleep walks  . Cholestatin   . Lipitor [Atorvastatin Calcium] Other (See Comments)    myalgias  . Ranitidine Other (See Comments)    Chest discomfort  . Simvastatin Other (See Comments)    Myalgias  . Xanax Xr [Alprazolam Er]     Tightness in chest  . Clopidogrel Bisulfate Rash    Patient Measurements: Height: 6\' 1"  (185.4 cm) Weight: 205 lb 8 oz (93.2 kg) IBW/kg (Calculated) : 79.9   Vital Signs: Temp: 98 F (36.7 C) (09/01 0350) Temp Source: Oral (09/01 0350) BP: 106/57 (09/01 0350) Pulse Rate: 70 (09/01 0350)  Labs:  Recent Labs  07/12/16 0623 07/13/16 0500 07/14/16 0540 07/14/16 1558 07/15/16 0319  HGB 12.5* 11.4* 11.5*  --  11.8*  HCT 37.1* 34.7* 34.8*  --  35.4*  PLT 192 162 171  --  186  HEPARINUNFRC  --   --   --  0.20* 0.18*  CREATININE 1.37* 1.34* 1.30*  --   --     Estimated Creatinine Clearance: 49.5 mL/min (by C-G formula based on SCr of 1.3 mg/dL).   Assessment: Heparin for afib while warfarin on hold, HL is sub-therapeutic this AM, hematuria resolved for now, Hgb stable  Goal of Therapy:  Heparin level 0.3-0.5 units/ml Monitor platelets by anticoagulation protocol: Yes   Plan:  -Inc heparin to 1250 units/hr -1230 HL  [name] [name] 07/15/2016,4:11 AM

## 2016-07-15 NOTE — Progress Notes (Signed)
PT Cancellation Note  Patient Details Name: [name] [name] MRN: [id number] DOB: 10-Oct-1932   Cancelled Treatment:    Reason Eval/Treat Not Completed: Medical issues which prohibited therapy   Events noted with pt recently transferred to ICU. Will continue to follow. Noted plans for TAVR Tues 9/5   Legend Pecore 07/15/2016, 9:15 AM  Pager [phone]

## 2016-07-15 NOTE — Progress Notes (Signed)
Pt stating he is cold after bath, shivering temp is 96, pt HR high 40s low 50s, BP 99/38, paged cardiology fellow Barrett aware of low HR, orders to apply bair hugger, will continue to monitor closely.

## 2016-07-15 NOTE — Progress Notes (Signed)
CRITICAL VALUE ALERT  Critical value received:  Glucose 526  Date of notification:  07/15/16  Time of notification:  [id number]  Critical value read back:Yes.    Nurse who received alert:  Verlin Grills  MD notified (1st page):  Tillery  Time of first page:  [id number]  Responding MD:  Chalmers Cater  Time MD responded:  [id number]  CBG when checked with finger stick is 219, but with picc line is >500. Tillery said to disregard the picc line CBG considering all other draws have been within the range of the finger stick results. Will continue to follow.

## 2016-07-16 DIAGNOSIS — I472 Ventricular tachycardia, unspecified: Secondary | ICD-10-CM

## 2016-07-16 LAB — BASIC METABOLIC PANEL
ANION GAP: 9 (ref 5–15)
BUN: 12 mg/dL (ref 6–20)
CALCIUM: 8.5 mg/dL — AB (ref 8.9–10.3)
CO2: 23 mmol/L (ref 22–32)
Chloride: 101 mmol/L (ref 101–111)
Creatinine, Ser: 1.38 mg/dL — ABNORMAL HIGH (ref 0.61–1.24)
GFR calc Af Amer: 53 mL/min — ABNORMAL LOW (ref >60)
GFR, EST NON AFRICAN AMERICAN: 46 mL/min — AB (ref >60)
GLUCOSE: 223 mg/dL — AB (ref 65–99)
POTASSIUM: 4.2 mmol/L (ref 3.5–5.1)
SODIUM: 133 mmol/L — AB (ref 135–145)

## 2016-07-16 LAB — CARBOXYHEMOGLOBIN
CARBOXYHEMOGLOBIN: 1.3 % (ref 0.5–1.5)
METHEMOGLOBIN: 0.7 % (ref 0.0–1.5)
O2 Saturation: 64.4 %
Total hemoglobin: 11.3 g/dL — ABNORMAL LOW (ref 12.0–16.0)

## 2016-07-16 LAB — CBC
HEMATOCRIT: 33.2 % — AB (ref 39.0–52.0)
HEMOGLOBIN: 10.8 g/dL — AB (ref 13.0–17.0)
MCH: 29.4 pg (ref 26.0–34.0)
MCHC: 32.5 g/dL (ref 30.0–36.0)
MCV: 90.5 fL (ref 78.0–100.0)
PLATELETS: 149 10*3/uL — AB (ref 150–400)
RBC: 3.67 MIL/uL — AB (ref 4.22–5.81)
RDW: 14.7 % (ref 11.5–15.5)
WBC: 6.1 10*3/uL (ref 4.0–10.5)

## 2016-07-16 LAB — GLUCOSE, CAPILLARY
GLUCOSE-CAPILLARY: 167 mg/dL — AB (ref 65–99)
Glucose-Capillary: 224 mg/dL — ABNORMAL HIGH (ref 65–99)
Glucose-Capillary: 156 mg/dL — ABNORMAL HIGH (ref 65–99)
Glucose-Capillary: 152 mg/dL — ABNORMAL HIGH (ref 65–99)

## 2016-07-16 LAB — MAGNESIUM: Magnesium: 2.2 mg/dL (ref 1.7–2.4)

## 2016-07-16 LAB — HEPARIN LEVEL (UNFRACTIONATED): HEPARIN UNFRACTIONATED: 0.45 [IU]/mL (ref 0.30–0.70)

## 2016-07-16 NOTE — Progress Notes (Signed)
ANTICOAGULATION CONSULT NOTE - Follow Up Consult  Pharmacy Consult for Heparin  Indication: atrial fibrillation  Patient Measurements: Height: 6\' 1"  (185.4 cm) Weight: 205 lb 8 oz (93.2 kg) IBW/kg (Calculated) : 79.9   Vital Signs: Temp: 97.5 F (36.4 C) (09/02 1200) Temp Source: Oral (09/02 0800) BP: 95/36 (09/02 1200) Pulse Rate: 64 (09/02 1200)  Labs:  Recent Labs  07/14/16 0540  07/15/16 0319 07/15/16 1200 07/16/16 0352  HGB 11.5*  --  11.8*  --  10.8*  HCT 34.8*  --  35.4*  --  33.2*  PLT 171  --  186  --  149*  HEPARINUNFRC  --   < > 0.18* 0.32 0.45  CREATININE 1.30*  --  1.66* 1.49* 1.38*  < > = values in this interval not displayed.  Estimated Creatinine Clearance: 46.6 mL/min (by C-G formula based on SCr of 1.38 mg/dL).   Assessment: Heparin for afib while warfarin on hold. Heparin level at goal. Patient previously with hematuria however appears to be resolved. No issues reported by RN. Hgb slightly down 11.8 > 10.8, PLT 149.  Goal of Therapy:  Heparin level 0.3-0.5 units/ml Monitor platelets by anticoagulation protocol: Yes   Plan:  -Continue heparin at 1300 units/hr -Daily HL/CBC  Melburn Popper, PharmD Clinical Pharmacy Resident Pager: [phone] 07/16/16 1:30 PM

## 2016-07-16 NOTE — Progress Notes (Addendum)
Patient ID: [name] [name], male   DOB: Apr 14, 1932, 82 y.o.   MRN: [id number]   Advanced Heart Failure Rounding Note   Subjective:    Developed frank hematuria 07/10/16. Heparin stopped. UA and urine cytology sent. UA + for blood and WBCs.   S/p R Carotid stent 07/11/16/   Urology saw 07/12/16 for hematuria/urinary retention. Thought to be likely due to infection. Cipro x 7 days + flomax. Had CBI catheter which is now out   Had VT on 9/2. Started IV amio and electrolytes supped. Milrinone turned down to 0.125.  VT improved.  Was bradycardic overnight and digoxin stopped (level 1.4)  Back on heparin. No hematuria. Feeling better.  Coox 64% this am on milrinone 0.125 mcg/kg/min. Renal function stable. CVP 3-4  TEE (8/17): EF 30-35%, severe AS, moderate to severe MR (suspect functional MR).   Carotid dopplers > 80% RICA stenosis.  CTA head/neck with >90% (critical) RICA stenosis and [id number] LICA.   RHC/LHC 06/17/16 RA 11 PCWP 25  CO/CI 3.25/1.4   Ost Cx to Prox Cx lesion, 50 %stenosed.  Mid Cx to Dist Cx lesion, 80 %stenosed.  Mid RCA lesion, 40 %stenosed.  Mid LAD lesion, 75 %stenosed.  2nd Diag lesion, 75 %stenosed.  Hemodynamic findings consistent with moderate pulmonary hypertension.  Unable to cross the aortic valve.  PA sat 52%. CO 3.2 L/min.   Objective:   Weight Range:  Vital Signs:   Temp:  [96 F (35.6 C)-98.5 F (36.9 C)] 97.6 F (36.4 C) (09/02 0800) Pulse Rate:  [40-73] 63 (09/02 0800) Resp:  [14-23] 15 (09/02 0800) BP: (71-135)/(33-68) 107/38 (09/02 0800) SpO2:  [94 %-100 %] 100 % (09/02 0800) Last BM Date: 07/12/16  Weight change: Filed Weights   07/13/16 0303 07/14/16 0413 07/15/16 0402  Weight: 91 kg (200 lb 9.9 oz) 88.2 kg (194 lb 7.1 oz) 93.2 kg (205 lb 8 oz)    Intake/Output:   Intake/Output Summary (Last 24 hours) at 07/16/16 1111 Last data filed at 07/16/16 0900  Gross per 24 hour  Intake          1180.57 ml  Output              850 ml    Net           330.57 ml     Physical Exam: CVP 3-4 General:  NAD. Elderly. Sitting in bed  HEENT: Normal Neck: supple. JVP flat Carotids 2+ bilat; no bruits. No thyromegaly or nodule noted. Cor: PMI nondisplaced. Irregularly irregular No S3.  2/6 systolic crescendo-decrescendo murmur with muffling of S2.   Lungs: CTAB, normal effort Abdomen: soft, NT, ND, no HSM. No bruits or masses. +BS  Extremities: no cyanosis, clubbing, rash, no edema.  Neuro: alert & orientedx3, cranial nerves grossly intact. moves all 4 extremities w/o difficulty. Affect pleasant  Telemetry: Reviewed, Afib 70s primarily. Several episodes of VT this morning up to rates of 140  Labs: Basic Metabolic Panel:  Recent Labs Lab 07/10/16 0518  07/13/16 0500 07/14/16 0540 07/15/16 0319 07/15/16 1200 07/16/16 0352  NA 131*  < > 132* 132* 134* 127* 133*  K 4.0  < > 3.8 3.8 3.6 3.3* 4.2  CL 94*  < > 97* 97* 98* 91* 101  CO2 27  < > 27 26 23 23 23   GLUCOSE 193*  < > 205* 185* 141* 526* 223*  BUN 24*  < > 18 16 18 16 12   CREATININE 1.37*  < > 1.34* 1.30*  1.66* 1.49* 1.38*  CALCIUM 9.4  < > 8.9 9.0 9.3 7.6* 8.5*  MG 2.0  --   --   --  2.0  --  2.2  < > = values in this interval not displayed.  Liver Function Tests: No results for input(s): AST, ALT, ALKPHOS, BILITOT, PROT, ALBUMIN in the last 168 hours. No results for input(s): LIPASE, AMYLASE in the last 168 hours. No results for input(s): AMMONIA in the last 168 hours.  CBC:  Recent Labs Lab 07/12/16 0623 07/13/16 0500 07/14/16 0540 07/15/16 0319 07/16/16 0352  WBC 8.8 7.3 7.8 6.2 6.1  HGB 12.5* 11.4* 11.5* 11.8* 10.8*  HCT 37.1* 34.7* 34.8* 35.4* 33.2*  MCV 88.5 89.0 89.9 88.5 90.5  PLT 192 162 171 186 149*    Cardiac Enzymes: No results for input(s): CKTOTAL, CKMB, CKMBINDEX, TROPONINI in the last 168 hours.  BNP: BNP (last 3 results)  Recent Labs  05/03/16 1305 05/28/16 1617 06/28/16 1316  BNP 232.0* 294.0* 222.0*    ProBNP (last  3 results) No results for input(s): PROBNP in the last 8760 hours.    Other results:  Imaging: No results found.   Medications:     Scheduled Medications: . aspirin  81 mg Oral Daily  . ciprofloxacin  500 mg Oral BID  . docusate sodium  100 mg Oral BID  . furosemide  40 mg Oral Daily  . insulin aspart  0-9 Units Subcutaneous TID WC  . loratadine  10 mg Oral Daily  . rosuvastatin  5 mg Oral q1800  . sodium chloride flush  10-40 mL Intracatheter Q12H  . sodium chloride flush  3 mL Intravenous Q12H  . tamsulosin  0.4 mg Oral Daily  . traZODone  100 mg Oral QHS    Infusions: . sodium chloride 50 mL/hr at 07/14/16 2000  . amiodarone 30 mg/hr (07/16/16 0600)  . heparin 1,300 Units/hr (07/16/16 0600)  . milrinone 0.25 mcg/kg/min (07/16/16 0200)    PRN Medications: sodium chloride, [DISCONTINUED] acetaminophen **OR** acetaminophen, acetaminophen, bisacodyl, diphenhydrAMINE, HYDROcodone-acetaminophen, hydrocortisone cream, morphine injection, nortriptyline, ondansetron **OR** ondansetron (ZOFRAN) IV, opium-belladonna, polyethylene glycol, sodium chloride flush, sodium chloride flush   Assessment:   1. Cardiogenic Shock- TEE with EF 30-35% 2. Acute Respiratory Failure 3. A fib RVR 4. CAD -  2 vessel disease 5. Valvular Heart Disease-  Severe AS/moderate-severe MR (MR may be functional).  6. Caroltid Stenosis- Severe carotid stenosis RICA >80% stenosis by carotid dopplers, CTA head/neck with critical >90% RICA stenosis an [id number] LICA.     --s/p R carotid stent 8/28 7. Bilateral Effusions  8. Nasopharyngeal mass 9. Rash- 10. Hyperkalemia 11. Hematuria 12. UTI 13. VT  Plan/Discussion:    S/p R carotid stent 07/11/16. Continue ASA (allergic to plavix and too high risk for Brilinta.)  VT quiescent on IV amio. Co-ox and CVP look good on reduced dose milrinone. Renal function stable.   Afib with good rate control - occasionally bradycardic. Digoxin stopped. Electrolytes  stable. Tolerating heparin without recurrent hematuria.  CT 07/04/16 with bilateral kidney lesions. Felt to be simple cysts.     Urinary retention and hematuria resolved. Continue abx for UTI,    2 vessel CAD, on ASA 81 and low dose Crestor (prior myalgias with simvastatin).   No BB with low output. Will eventually consider ACEi.   He is too high risk for conventional surgery (low gradient severe AS and moderate to severe probably functional MR).   Continue PT.   Continue with plan for  TAVR Tuesday 07/19/16.  Length of Stay: 18  Elon Lomeli,MD 11:11 AM Advanced Heart Failure Team Pager [phone] (M-F; 7a - 4p)  Please contact [facility] for night-coverage after hours (4p -7a ) and weekends on [email or web address]

## 2016-07-16 NOTE — Plan of Care (Signed)
Problem: Physical Regulation: Goal: Ability to maintain clinical measurements within normal limits will improve Outcome: Progressing Pt has less ectopy on Amio gtt, BP soft  Problem: Cardiac: Goal: Ability to achieve and maintain adequate cardiopulmonary perfusion will improve Outcome: Progressing Pt remains on Milrinone gtt

## 2016-07-16 NOTE — Progress Notes (Signed)
CARDIAC REHAB PHASE I   PRE:  Rate/Rhythm: 76 afib    BP: sitting 125/52    SaO2: 100 2L  MODE:  Ambulation: around bed to recliner    POST:  Rate/Rhythm: 83 afib    BP: sitting 101/59     SaO2: 99 2L  Pt very sleepy. Would awake easily but go back to sleep. Able to stand and walk with assist x2 for supervision and equipment, used RW and O2 for support. No major c/o, just tired. To recliner. Set up to eat breakfast. Will f/u. Pt would like to walk more later. [id number]   [place], ACSM 07/16/2016 11:05 AM

## 2016-07-17 LAB — BASIC METABOLIC PANEL
Anion gap: 6 (ref 5–15)
BUN: 14 mg/dL (ref 6–20)
CHLORIDE: 102 mmol/L (ref 101–111)
CO2: 25 mmol/L (ref 22–32)
CREATININE: 1.29 mg/dL — AB (ref 0.61–1.24)
Calcium: 8.4 mg/dL — ABNORMAL LOW (ref 8.9–10.3)
GFR, EST AFRICAN AMERICAN: 58 mL/min — AB (ref >60)
GFR, EST NON AFRICAN AMERICAN: 50 mL/min — AB (ref >60)
Glucose, Bld: 145 mg/dL — ABNORMAL HIGH (ref 65–99)
POTASSIUM: 3.5 mmol/L (ref 3.5–5.1)
SODIUM: 133 mmol/L — AB (ref 135–145)

## 2016-07-17 LAB — CBC
HEMATOCRIT: 31.6 % — AB (ref 39.0–52.0)
HEMOGLOBIN: 10.3 g/dL — AB (ref 13.0–17.0)
MCH: 29.5 pg (ref 26.0–34.0)
MCHC: 32.6 g/dL (ref 30.0–36.0)
MCV: 90.5 fL (ref 78.0–100.0)
Platelets: 151 10*3/uL (ref 150–400)
RBC: 3.49 MIL/uL — AB (ref 4.22–5.81)
RDW: 14.6 % (ref 11.5–15.5)
WBC: 7.1 10*3/uL (ref 4.0–10.5)

## 2016-07-17 LAB — MAGNESIUM: MAGNESIUM: 1.9 mg/dL (ref 1.7–2.4)

## 2016-07-17 LAB — GLUCOSE, CAPILLARY
GLUCOSE-CAPILLARY: 168 mg/dL — AB (ref 65–99)
GLUCOSE-CAPILLARY: 162 mg/dL — AB (ref 65–99)
GLUCOSE-CAPILLARY: 172 mg/dL — AB (ref 65–99)
GLUCOSE-CAPILLARY: 159 mg/dL — AB (ref 65–99)

## 2016-07-17 LAB — CARBOXYHEMOGLOBIN
Carboxyhemoglobin: 1 % (ref 0.5–1.5)
METHEMOGLOBIN: 0.9 % (ref 0.0–1.5)
O2 SAT: 66.5 %
TOTAL HEMOGLOBIN: 9.8 g/dL — AB (ref 12.0–16.0)

## 2016-07-17 LAB — HEPARIN LEVEL (UNFRACTIONATED): HEPARIN UNFRACTIONATED: 0.26 [IU]/mL — AB (ref 0.30–0.70)

## 2016-07-17 MED ORDER — VANCOMYCIN HCL IN DEXTROSE 750-5 MG/150ML-% IV SOLN
750.0000 mg | Freq: Two times a day (BID) | INTRAVENOUS | Status: DC
  Administered 2016-07-17 – 2016-07-19 (×4): 750 mg via INTRAVENOUS
  Filled 2016-07-17 (×5): qty 150

## 2016-07-17 NOTE — Progress Notes (Addendum)
Pharmacy Antibiotic Note  [name] [name] is a 82 y.o. male admitted on 06/28/2016 with cardiogenic shock. Now patient is shivering and has plans for upcoming TAVR. Per cards note will draw blood cultures and treat with empiric Vancomycin. Pharmacy has been consulted to dose vancomycin. SCr 1.29 with slight trend down.  Plan: Vancomycin 750 IV every 12 hours.  Goal trough 15-20 mcg/mL.  Height: 6\' 1"  (185.4 cm) Weight: 207 lb 3.7 oz (94 kg) IBW/kg (Calculated) : 79.9  Temp (24hrs), Avg:98 F (36.7 C), Min:97.6 F (36.4 C), Max:98.5 F (36.9 C)   Recent Labs Lab 07/13/16 0500 07/14/16 0540 07/15/16 0319 07/15/16 1200 07/16/16 0352 07/17/16 0400  WBC 7.3 7.8 6.2  --  6.1 7.1  CREATININE 1.34* 1.30* 1.66* 1.49* 1.38* 1.29*    Estimated Creatinine Clearance: 49.9 mL/min (by C-G formula based on SCr of 1.29 mg/dL).    Antimicrobials this admission: 8/28 cipro >> 9/4 9/3 vanc >>   Microbiology results: 8/28 UCx: multiple species and recommended recollect   Thank you for allowing pharmacy to be a part of this patient's care.  Melburn Popper, PharmD Clinical Pharmacy Resident Pager: [phone] 07/17/16 12:36 PM

## 2016-07-17 NOTE — Progress Notes (Addendum)
Patient ID: [name] [name], male   DOB: 1931-11-15, 82 y.o.   MRN: [id number]   Advanced Heart Failure Rounding Note   Subjective:    Developed frank hematuria 07/10/16. Heparin stopped. UA and urine cytology sent. UA + for blood and WBCs.   S/p R Carotid stent 07/11/16  Urology saw 07/12/16 for hematuria/urinary retention. Thought to be likely due to infection. Cipro x 7 days + flomax. Had CBI catheter which is now out   Had VT on 9/2. Started IV amio and electrolytes supped. Milrinone turned down to 0.125.  VT improved.  Was bradycardic overnight and digoxin stopped (level 1.4) on 9/2.   Remains on heparin. No hematuria. Feeling better but says he is very cold and shivering. Coox 67% this am on milrinone 0.125 mcg/kg/min. Renal function stable. CVP 3-4  TEE (8/17): EF 30-35%, severe AS, moderate to severe MR (suspect functional MR).   Carotid dopplers > 80% RICA stenosis s/p carotid stent 07/11/16 CTA head/neck with >90% (critical) RICA stenosis and [id number] LICA.   RHC/LHC 06/17/16 RA 11 PCWP 25  CO/CI 3.25/1.4   Ost Cx to Prox Cx lesion, 50 %stenosed.  Mid Cx to Dist Cx lesion, 80 %stenosed.  Mid RCA lesion, 40 %stenosed.  Mid LAD lesion, 75 %stenosed.  2nd Diag lesion, 75 %stenosed.  Hemodynamic findings consistent with moderate pulmonary hypertension.  Unable to cross the aortic valve.  PA sat 52%. CO 3.2 L/min.   Objective:   Weight Range:  Vital Signs:   Temp:  [97.5 F (36.4 C)-98.5 F (36.9 C)] 98 F (36.7 C) (09/03 0700) Pulse Rate:  [57-76] 72 (09/03 0900) Resp:  [15-30] 23 (09/03 0900) BP: (71-119)/(34-77) 101/58 (09/03 0900) SpO2:  [95 %-100 %] 97 % (09/03 0900) Weight:  [94 kg (207 lb 3.7 oz)] 94 kg (207 lb 3.7 oz) (09/03 0500) Last BM Date: 07/16/16  Weight change: Filed Weights   07/14/16 0413 07/15/16 0402 07/17/16 0500  Weight: 88.2 kg (194 lb 7.1 oz) 93.2 kg (205 lb 8 oz) 94 kg (207 lb 3.7 oz)    Intake/Output:   Intake/Output Summary (Last  24 hours) at 07/17/16 1108 Last data filed at 07/17/16 1000  Gross per 24 hour  Intake           1368.7 ml  Output              975 ml  Net            393.7 ml     Physical Exam: CVP 4 General:  NAD. Elderly. Lying in bed. Shivering HEENT: Normal Neck: supple. JVP flat Carotids 2+ bilat; no bruits. No thyromegaly or nodule noted. Cor: PMI nondisplaced. Irregularly irregular No S3.  2/6 systolic crescendo-decrescendo murmur with muffling of S2.   Lungs: CTAB, normal effort Abdomen: soft, NT, ND, no HSM. No bruits or masses. +BS  Extremities: no cyanosis, clubbing, rash, no edema.  Neuro: alert & orientedx3, cranial nerves grossly intact. moves all 4 extremities w/o difficulty. Affect pleasant  Telemetry: Reviewed, Afib 70s primarily. No further VT  Labs: Basic Metabolic Panel:  Recent Labs Lab 07/14/16 0540 07/15/16 0319 07/15/16 1200 07/16/16 0352 07/17/16 0400  NA 132* 134* 127* 133* 133*  K 3.8 3.6 3.3* 4.2 3.5  CL 97* 98* 91* 101 102  CO2 26 23 23 23 25   GLUCOSE 185* 141* 526* 223* 145*  BUN 16 18 16 12 14   CREATININE 1.30* 1.66* 1.49* 1.38* 1.29*  CALCIUM 9.0 9.3 7.6* 8.5* 8.4*  MG  --  2.0  --  2.2 1.9    Liver Function Tests: No results for input(s): AST, ALT, ALKPHOS, BILITOT, PROT, ALBUMIN in the last 168 hours. No results for input(s): LIPASE, AMYLASE in the last 168 hours. No results for input(s): AMMONIA in the last 168 hours.  CBC:  Recent Labs Lab 07/13/16 0500 07/14/16 0540 07/15/16 0319 07/16/16 0352 07/17/16 0400  WBC 7.3 7.8 6.2 6.1 7.1  HGB 11.4* 11.5* 11.8* 10.8* 10.3*  HCT 34.7* 34.8* 35.4* 33.2* 31.6*  MCV 89.0 89.9 88.5 90.5 90.5  PLT 162 171 186 149* 151    Cardiac Enzymes: No results for input(s): CKTOTAL, CKMB, CKMBINDEX, TROPONINI in the last 168 hours.  BNP: BNP (last 3 results)  Recent Labs  05/03/16 1305 05/28/16 1617 06/28/16 1316  BNP 232.0* 294.0* 222.0*    ProBNP (last 3 results) No results for input(s):  PROBNP in the last 8760 hours.    Other results:  Imaging: No results found.   Medications:     Scheduled Medications: . aspirin  81 mg Oral Daily  . ciprofloxacin  500 mg Oral BID  . docusate sodium  100 mg Oral BID  . furosemide  40 mg Oral Daily  . insulin aspart  0-9 Units Subcutaneous TID WC  . loratadine  10 mg Oral Daily  . rosuvastatin  5 mg Oral q1800  . sodium chloride flush  10-40 mL Intracatheter Q12H  . sodium chloride flush  3 mL Intravenous Q12H  . tamsulosin  0.4 mg Oral Daily  . traZODone  100 mg Oral QHS    Infusions: . sodium chloride 50 mL/hr at 07/14/16 2000  . amiodarone 30 mg/hr (07/17/16 0758)  . heparin 1,300 Units/hr (07/17/16 0300)  . milrinone 0.25 mcg/kg/min (07/17/16 0600)    PRN Medications: sodium chloride, [DISCONTINUED] acetaminophen **OR** acetaminophen, acetaminophen, bisacodyl, diphenhydrAMINE, HYDROcodone-acetaminophen, hydrocortisone cream, morphine injection, nortriptyline, ondansetron **OR** ondansetron (ZOFRAN) IV, opium-belladonna, polyethylene glycol, sodium chloride flush, sodium chloride flush   Assessment:   1. Cardiogenic Shock- TEE with EF 30-35% 2. Acute Respiratory Failure 3. A fib RVR 4. CAD -  2 vessel disease 5. Valvular Heart Disease-  Severe AS/moderate-severe MR (MR may be functional).  6. Caroltid Stenosis- Severe carotid stenosis RICA >80% stenosis by carotid dopplers, CTA head/neck with critical >90% RICA stenosis an [id number] LICA.     --s/p R carotid stent 8/28 7. Bilateral Effusions  8. Nasopharyngeal mass 9. Rash- 10. Hyperkalemia 11. Hematuria 12. UTI 13. VT  Plan/Discussion:    S/p R carotid stent 07/11/16. Continue ASA (allergic to plavix and too high risk for Brilinta.)  VT quiescent on IV amio. Co-ox and CVP look good on reduced dose milrinone. Renal function stable.   Afib with good rate control - occasionally bradycardic. Digoxin stopped. Electrolytes stable. Tolerating heparin without  recurrent hematuria.  CT 07/04/16 with bilateral kidney lesions. Felt to be simple cysts.     Urinary retention and hematuria resolved. Continue abx for UTI,  He is shivering (? Chills). Will draw BCx and treat with empiric vanc given upcoming TAVR.   2 vessel CAD, on ASA 81 and low dose Crestor (prior myalgias with simvastatin).   No BB with low output. Will eventually consider ACEi.   He is too high risk for conventional surgery (low gradient severe AS and moderate to severe probably functional MR).   Continue PT.   Can move back to SDU.   Continue with plan for TAVR Tuesday 07/19/16.  Length of  Stay: 35  Kolbie [place],MD 11:08 AM Advanced Heart Failure Team Pager [phone] (M-F; 7a - 4p)  Please contact [facility] for night-coverage after hours (4p -7a ) and weekends on [email or web address]

## 2016-07-17 NOTE — Progress Notes (Signed)
ANTICOAGULATION CONSULT NOTE - Follow Up Consult  Pharmacy Consult for Heparin  Indication: atrial fibrillation  Patient Measurements: Height: 6\' 1"  (185.4 cm) Weight: 207 lb 3.7 oz (94 kg) IBW/kg (Calculated) : 79.9   Vital Signs: Temp: 98.5 F (36.9 C) (09/02 2355) Temp Source: Oral (09/02 2355) BP: 113/40 (09/03 0700) Pulse Rate: 74 (09/03 0700)  Labs:  Recent Labs  07/15/16 0319 07/15/16 1200 07/16/16 0352 07/17/16 0400  HGB 11.8*  --  10.8* 10.3*  HCT 35.4*  --  33.2* 31.6*  PLT 186  --  149* 151  HEPARINUNFRC 0.18* 0.32 0.45 0.26*  CREATININE 1.66* 1.49* 1.38* 1.29*    Estimated Creatinine Clearance: 49.9 mL/min (by C-G formula based on SCr of 1.29 mg/dL).   Assessment: Heparin for afib while warfarin on hold. Heparin level at goal*2 on 1300 units/hr, however this am heparin level slightly below goal (0.26). RN reports having to restart heparin frequently due to arm bending. Patient previously with hematuria however appears to be resolved. Hgb slightly down 11.8 > 10.8 > 10.3, PLT 151.  Goal of Therapy:  Heparin level 0.3-0.5 units/ml Monitor platelets by anticoagulation protocol: Yes   Plan:  -Continue heparin at 1300 units/hr with no change for now, follow up with morning labs to assess need for rate adjustment -Daily heparin level and CBC  Melburn Popper, PharmD Clinical Pharmacy Resident Pager: [phone] 07/17/16 7:30 AM

## 2016-07-18 LAB — SURGICAL PCR SCREEN
MRSA, PCR: NEGATIVE
Staphylococcus aureus: NEGATIVE

## 2016-07-18 LAB — BASIC METABOLIC PANEL
ANION GAP: 6 (ref 5–15)
BUN: 13 mg/dL (ref 6–20)
CHLORIDE: 100 mmol/L — AB (ref 101–111)
CO2: 25 mmol/L (ref 22–32)
Calcium: 8.2 mg/dL — ABNORMAL LOW (ref 8.9–10.3)
Creatinine, Ser: 1.28 mg/dL — ABNORMAL HIGH (ref 0.61–1.24)
GFR calc Af Amer: 58 mL/min — ABNORMAL LOW (ref >60)
GFR, EST NON AFRICAN AMERICAN: 50 mL/min — AB (ref >60)
GLUCOSE: 256 mg/dL — AB (ref 65–99)
POTASSIUM: 3.3 mmol/L — AB (ref 3.5–5.1)
Sodium: 131 mmol/L — ABNORMAL LOW (ref 135–145)

## 2016-07-18 LAB — CARBOXYHEMOGLOBIN
Carboxyhemoglobin: 1.6 % — ABNORMAL HIGH (ref 0.5–1.5)
METHEMOGLOBIN: 0.7 % (ref 0.0–1.5)
O2 Saturation: 64.7 %
TOTAL HEMOGLOBIN: 10.5 g/dL — AB (ref 12.0–16.0)

## 2016-07-18 LAB — HEPARIN LEVEL (UNFRACTIONATED)
HEPARIN UNFRACTIONATED: 0.33 [IU]/mL (ref 0.30–0.70)
Heparin Unfractionated: 0.25 IU/mL — ABNORMAL LOW (ref 0.30–0.70)

## 2016-07-18 LAB — CBC
HCT: 31.8 % — ABNORMAL LOW (ref 39.0–52.0)
Hemoglobin: 10.3 g/dL — ABNORMAL LOW (ref 13.0–17.0)
MCH: 29.3 pg (ref 26.0–34.0)
MCHC: 32.4 g/dL (ref 30.0–36.0)
MCV: 90.6 fL (ref 78.0–100.0)
PLATELETS: 138 10*3/uL — AB (ref 150–400)
RBC: 3.51 MIL/uL — ABNORMAL LOW (ref 4.22–5.81)
RDW: 14.6 % (ref 11.5–15.5)
WBC: 6.7 10*3/uL (ref 4.0–10.5)

## 2016-07-18 LAB — GLUCOSE, CAPILLARY
GLUCOSE-CAPILLARY: 147 mg/dL — AB (ref 65–99)
GLUCOSE-CAPILLARY: 198 mg/dL — AB (ref 65–99)
GLUCOSE-CAPILLARY: 140 mg/dL — AB (ref 65–99)
Glucose-Capillary: 146 mg/dL — ABNORMAL HIGH (ref 65–99)
Glucose-Capillary: 209 mg/dL — ABNORMAL HIGH (ref 65–99)

## 2016-07-18 LAB — ABO/RH: ABO/RH(D): A POS

## 2016-07-18 LAB — TYPE AND SCREEN
ABO/RH(D): A POS
ANTIBODY SCREEN: NEGATIVE

## 2016-07-18 LAB — MAGNESIUM: Magnesium: 1.8 mg/dL (ref 1.7–2.4)

## 2016-07-18 MED ORDER — MAGNESIUM SULFATE 50 % IJ SOLN
40.0000 meq | INTRAMUSCULAR | Status: DC
  Filled 2016-07-18: qty 10

## 2016-07-18 MED ORDER — DEXTROSE 5 % IV SOLN
1.5000 g | INTRAVENOUS | Status: AC
  Administered 2016-07-19: 1.5 g via INTRAVENOUS
  Filled 2016-07-18: qty 1.5

## 2016-07-18 MED ORDER — VANCOMYCIN HCL 10 G IV SOLR
1250.0000 mg | INTRAVENOUS | Status: AC
  Administered 2016-07-19: 1250 mg via INTRAVENOUS
  Filled 2016-07-18: qty 1250

## 2016-07-18 MED ORDER — SODIUM CHLORIDE 0.9 % IV SOLN
INTRAVENOUS | Status: DC
  Filled 2016-07-18: qty 2.5

## 2016-07-18 MED ORDER — DOPAMINE-DEXTROSE 3.2-5 MG/ML-% IV SOLN
0.0000 ug/kg/min | INTRAVENOUS | Status: DC
  Filled 2016-07-18: qty 250

## 2016-07-18 MED ORDER — HEPARIN SODIUM (PORCINE) 1000 UNIT/ML IJ SOLN
INTRAMUSCULAR | Status: DC
  Filled 2016-07-18: qty 30

## 2016-07-18 MED ORDER — DEXMEDETOMIDINE HCL IN NACL 400 MCG/100ML IV SOLN
0.1000 ug/kg/h | INTRAVENOUS | Status: DC
  Filled 2016-07-18: qty 100

## 2016-07-18 MED ORDER — POTASSIUM CHLORIDE CRYS ER 20 MEQ PO TBCR
20.0000 meq | EXTENDED_RELEASE_TABLET | Freq: Two times a day (BID) | ORAL | Status: AC
  Administered 2016-07-18 (×2): 20 meq via ORAL
  Filled 2016-07-18 (×2): qty 1

## 2016-07-18 MED ORDER — NITROGLYCERIN IN D5W 200-5 MCG/ML-% IV SOLN
2.0000 ug/min | INTRAVENOUS | Status: DC
  Filled 2016-07-18: qty 250

## 2016-07-18 MED ORDER — POTASSIUM CHLORIDE CRYS ER 20 MEQ PO TBCR: 20.0000 meq | EXTENDED_RELEASE_TABLET | Freq: Two times a day (BID) | ORAL | Status: DC

## 2016-07-18 MED ORDER — POTASSIUM CHLORIDE 2 MEQ/ML IV SOLN
80.0000 meq | INTRAVENOUS | Status: DC
  Filled 2016-07-18: qty 40

## 2016-07-18 MED ORDER — EPINEPHRINE HCL 1 MG/ML IJ SOLN
0.0000 ug/min | INTRAVENOUS | Status: DC
  Filled 2016-07-18: qty 4

## 2016-07-18 MED ORDER — NOREPINEPHRINE BITARTRATE 1 MG/ML IV SOLN
0.0000 ug/min | INTRAVENOUS | Status: DC
  Filled 2016-07-18: qty 4

## 2016-07-18 MED ORDER — DEXTROSE 5 % IV SOLN
30.0000 ug/min | INTRAVENOUS | Status: DC
  Filled 2016-07-18: qty 2

## 2016-07-18 NOTE — Progress Notes (Signed)
ANTICOAGULATION CONSULT NOTE - Follow Up Consult  Pharmacy Consult for Heparin  Indication: atrial fibrillation  Patient Measurements: Height: 6\' 1"  (185.4 cm) Weight: 208 lb 8 oz (94.6 kg) IBW/kg (Calculated) : 79.9   Vital Signs: Temp: 98.4 F (36.9 C) (09/04 1215) Temp Source: Oral (09/04 1215) BP: 117/48 (09/04 1300) Pulse Rate: 61 (09/04 1300)  Labs:  Recent Labs  07/16/16 0352 07/17/16 0400 07/18/16 0400 07/18/16 1245  HGB 10.8* 10.3* 10.3*  --   HCT 33.2* 31.6* 31.8*  --   PLT 149* 151 138*  --   HEPARINUNFRC 0.45 0.26* 0.25* 0.33  CREATININE 1.38* 1.29* 1.28*  --     Estimated Creatinine Clearance: 50.3 mL/min (by C-G formula based on SCr of 1.28 mg/dL).   Assessment: Heparin for afib while warfarin on hold. Heparin level now at goal after slight rate increase. Patient previously with hematuria however appears to be resolved. Hgb 10.3, PLT 138  Goal of Therapy:  Heparin level 0.3-0.5 units/ml Monitor platelets by anticoagulation protocol: Yes   Plan:  -Continue heparin at 1400 units/hr -Daily heparin level and CBC  Melburn Popper, PharmD Clinical Pharmacy Resident Pager: [phone] 07/18/16 1:49 PM

## 2016-07-18 NOTE — Progress Notes (Signed)
ANTICOAGULATION CONSULT NOTE  Pharmacy Consult for Heparin  Indication: atrial fibrillation  Patient Measurements: Height: 6\' 1"  (185.4 cm) Weight: 207 lb 3.7 oz (94 kg) IBW/kg (Calculated) : 79.9   Vital Signs: Temp: 97.8 F (36.6 C) (09/04 0326) Temp Source: Axillary (09/04 0326) BP: 95/39 (09/04 0400) Pulse Rate: 64 (09/04 0400)  Labs:  Recent Labs  07/15/16 1200  07/16/16 0352 07/17/16 0400 07/18/16 0400  HGB  --   < > 10.8* 10.3* 10.3*  HCT  --   --  33.2* 31.6* 31.8*  PLT  --   --  149* 151 138*  HEPARINUNFRC 0.32  --  0.45 0.26* 0.25*  CREATININE 1.49*  --  1.38* 1.29*  --   < > = values in this interval not displayed.  Estimated Creatinine Clearance: 49.9 mL/min (by C-G formula based on SCr of 1.29 mg/dL).   Assessment: 82 y.o. male with afib, Coumadin on hold, for heparin .  Goal of Therapy:  Heparin level 0.3-0.5 units/ml Monitor platelets by anticoagulation protocol: Yes   Plan:  Increase Heparin  1400 units/hr  Phillis Knack, PharmD, BCPS  07/18/16 4:48 AM

## 2016-07-18 NOTE — Progress Notes (Signed)
TAVR TEAM NOTE Subjective:  No CP or dyspnea this am. Chills have resolved.   Objective:  Vital Signs in the last 24 hours: Temp:  [97.5 F (36.4 C)-98.3 F (36.8 C)] 98.3 F (36.8 C) (09/04 0718) Pulse Rate:  [57-85] 68 (09/04 0800) Resp:  [13-28] 28 (09/04 0800) BP: (79-130)/(31-108) 79/52 (09/04 0800) SpO2:  [95 %-100 %] 96 % (09/04 0800) Weight:  [94.6 kg (208 lb 8 oz)] 94.6 kg (208 lb 8 oz) (09/04 0500)  Intake/Output from previous day: 09/03 0701 - 09/04 0700 In: 1827.8 [P.O.:510; I.V.:1017.8; IV Piggyback:300] Out: 2325 [Urine:2325]  Physical Exam: Pt is alert and oriented, elderly male in NAD HEENT: normal Neck: JVP - normal Lungs: CTA bilaterally CV: irregular with 2/6 systolic murmur at the RUSB Abd: soft, NT, Positive BS, no hepatomegaly Ext: no C/C/E, right groin ecchymoses. Bandage removed, no hematoma, right femoral pulse 2+ Skin: warm/dry no rash   Lab Results:  Recent Labs  07/17/16 0400 07/18/16 0400  WBC 7.1 6.7  HGB 10.3* 10.3*  PLT 151 138*    Recent Labs  07/17/16 0400 07/18/16 0400  NA 133* 131*  K 3.5 3.3*  CL 102 100*  CO2 25 25  GLUCOSE 145* 256*  BUN 14 13  CREATININE 1.29* 1.28*   No results for input(s): TROPONINI in the last 72 hours.  Invalid input(s): CK, MB  Cardiac Studies: Echo: Study Conclusions  - Left ventricle: The cavity size was normal. Wall thickness was   increased in a pattern of mild LVH. Systolic function was   moderately reduced. The estimated ejection fraction was in the   range of 35% to 40%. - Aortic valve: AV is thickened, calcified with moderately   restricted motion Peak and mean gradients through the valve are   38 and 21 mm Hg respectively This is mildly increased from echo   of June 2017. There was mild regurgitation. - Mitral valve: There was mild to moderate regurgitation. - Left atrium: The atrium was severely dilated. - Right ventricle: Systolic function was mildly reduced. -  Right atrium: The atrium was mildly dilated. - Pulmonary arteries: PA peak pressure: 41 mm Hg (S).  Impressions:  - Poor acoustic windows lmit study.  Cath: Procedures   Right Heart Cath and Coronary Angiography  Conclusion     Ost Cx to Prox Cx lesion, 50 %stenosed.  Mid Cx to Dist Cx lesion, 80 %stenosed.  Mid RCA lesion, 40 %stenosed.  Mid LAD lesion, 75 %stenosed.  2nd Diag lesion, 75 %stenosed.  Hemodynamic findings consistent with moderate pulmonary hypertension.  Unable to cross the aortic valve.  PA sat 52%. CO 3.2 L/min.   Continue with plans for CT surgery evaluation, for valve disease and two vessel CAD.    Tele: Atrial fibrillation, rate controlled  Assessment/Plan:  1. Cardiogenic Shock- TEE with EF 30-35% 2. Acute Respiratory Failure - resolved 3. A fib RVR - rate controlled on IV amiodarone 4. CAD -  2 vessel disease - plan medical Rx 5. Valvular Heart Disease-  Severe AS/moderate-severe MR (MR may be functional).  6. Caroltid Stenosis- Severe carotid stenosis RICA >80% stenosis by carotid dopplers, CTA head/neck with critical >90% RICA stenosis an [id number] LICA. Now s/p Carotid stent without periprocedural complication    --s/p R carotid stent 8/28 7. Bilateral Effusions  8. Nasopharyngeal mass 9. Rash- possible clopidogrel allergy 10. Hypokalemia - KDur written 11. Hematuria - no recurrence tolerating IV heparin 12. UTI 13. VT - stable on amiodarone  14. Chills - no fever or hypothermia overnight. Symptoms resolved this am  Overall looks stable. Continues on IV amiodarone, IV milrinone, and heparin infusion. Co-ox 65 this am. Vanc started yesterday for chills and concern for infection - Cx's drawn. KDur 20 meq BID today written. Discussed TAVR surgery at length with patient and niece again. They understand he is not a candidate for sternotomy even if complications arise. Post-TAVR anticipate continuation of ASA and will start oral anticoagulant for  atrial fibrillation - will discuss enrollment in TAVI-AF Trial if he qualifies - will need to assess whether recent carotid stent is an exclusion.   Sherren Mocha, M.D. 07/18/2016, 9:37 AM

## 2016-07-18 NOTE — Progress Notes (Signed)
Pt is first case for TAVR in the morning. No orders had been put in for procedure. Surgical PCR had been done, I sent down a type & screen and put in an order for an abg. Will do 2 CHG baths. Paged the on-call Cards MD, he was unsure of protocol so no orders given.

## 2016-07-18 NOTE — Progress Notes (Signed)
Patient ID: [name] [name], male   DOB: October 25, 1932, 82 y.o.   MRN: [id number]   Advanced Heart Failure Rounding Note   Subjective:    Developed frank hematuria 07/10/16. Heparin stopped. UA and urine cytology sent. UA + for blood and WBCs.   S/p R Carotid stent 07/11/16  Urology saw 07/12/16 for hematuria/urinary retention. Thought to be likely due to infection. Cipro x 7 days + flomax. Had CBI catheter which is now out   Had VT on 9/2. Started IV amio and electrolytes supped. Milrinone turned down to 0.125.  VT improved.  Was bradycardic and digoxin stopped (level 1.4) on 9/2.   Remains on heparin. No hematuria.   Yesterday was very cold and shivering. Bcx drawn. Started on empiric vancomycin.   Feels better today. Coox 65% this am on milrinone 0.125 mcg/kg/min. Renal function improved. CVP 3-4. BCx NGTD. WBC 6.7  TEE (8/17): EF 30-35%, severe AS, moderate to severe MR (suspect functional MR).   Carotid dopplers > 80% RICA stenosis s/p carotid stent 07/11/16 CTA head/neck with >90% (critical) RICA stenosis and [id number] LICA.   RHC/LHC 06/17/16 RA 11 PCWP 25  CO/CI 3.25/1.4   Ost Cx to Prox Cx lesion, 50 %stenosed.  Mid Cx to Dist Cx lesion, 80 %stenosed.  Mid RCA lesion, 40 %stenosed.  Mid LAD lesion, 75 %stenosed.  2nd Diag lesion, 75 %stenosed.  Hemodynamic findings consistent with moderate pulmonary hypertension.  Unable to cross the aortic valve.  PA sat 52%. CO 3.2 L/min.   Objective:   Weight Range:  Vital Signs:   Temp:  [97.5 F (36.4 C)-98.3 F (36.8 C)] 98.3 F (36.8 C) (09/04 0718) Pulse Rate:  [57-85] 68 (09/04 0800) Resp:  [13-28] 28 (09/04 0800) BP: (79-130)/(31-108) 79/52 (09/04 0800) SpO2:  [95 %-100 %] 96 % (09/04 0800) Weight:  [94.6 kg (208 lb 8 oz)] 94.6 kg (208 lb 8 oz) (09/04 0500) Last BM Date: 07/17/16  Weight change: Filed Weights   07/15/16 0402 07/17/16 0500 07/18/16 0500  Weight: 93.2 kg (205 lb 8 oz) 94 kg (207 lb 3.7 oz) 94.6 kg  (208 lb 8 oz)    Intake/Output:   Intake/Output Summary (Last 24 hours) at 07/18/16 0913 Last data filed at 07/18/16 0700  Gross per 24 hour  Intake          1663.97 ml  Output             2175 ml  Net          -511.03 ml     Physical Exam: CVP 4 General:  NAD. Elderly. Lying in bed. Shivering HEENT: Normal Neck: supple. JVP flat Carotids 2+ bilat; no bruits. No thyromegaly or nodule noted. Cor: PMI nondisplaced. Irregularly irregular No S3.  2/6 systolic crescendo-decrescendo murmur with muffling of S2.   Lungs: CTAB, normal effort Abdomen: soft, NT, ND, no HSM. No bruits or masses. +BS  Extremities: no cyanosis, clubbing, rash, no edema.  Neuro: alert & orientedx3, cranial nerves grossly intact. moves all 4 extremities w/o difficulty. Affect pleasant  Telemetry: Reviewed, Afib 70s primarily. No further VT  Labs: Basic Metabolic Panel:  Recent Labs Lab 07/15/16 0319 07/15/16 1200 07/16/16 0352 07/17/16 0400 07/18/16 0400  NA 134* 127* 133* 133* 131*  K 3.6 3.3* 4.2 3.5 3.3*  CL 98* 91* 101 102 100*  CO2 23 23 23 25 25   GLUCOSE 141* 526* 223* 145* 256*  BUN 18 16 12 14 13   CREATININE 1.66* 1.49* 1.38* 1.29*  1.28*  CALCIUM 9.3 7.6* 8.5* 8.4* 8.2*  MG 2.0  --  2.2 1.9 1.8    Liver Function Tests: No results for input(s): AST, ALT, ALKPHOS, BILITOT, PROT, ALBUMIN in the last 168 hours. No results for input(s): LIPASE, AMYLASE in the last 168 hours. No results for input(s): AMMONIA in the last 168 hours.  CBC:  Recent Labs Lab 07/14/16 0540 07/15/16 0319 07/16/16 0352 07/17/16 0400 07/18/16 0400  WBC 7.8 6.2 6.1 7.1 6.7  HGB 11.5* 11.8* 10.8* 10.3* 10.3*  HCT 34.8* 35.4* 33.2* 31.6* 31.8*  MCV 89.9 88.5 90.5 90.5 90.6  PLT 171 186 149* 151 138*    Cardiac Enzymes: No results for input(s): CKTOTAL, CKMB, CKMBINDEX, TROPONINI in the last 168 hours.  BNP: BNP (last 3 results)  Recent Labs  05/03/16 1305 05/28/16 1617 06/28/16 1316  BNP 232.0*  294.0* 222.0*    ProBNP (last 3 results) No results for input(s): PROBNP in the last 8760 hours.    Other results:  Imaging: No results found.   Medications:     Scheduled Medications: . aspirin  81 mg Oral Daily  . docusate sodium  100 mg Oral BID  . furosemide  40 mg Oral Daily  . insulin aspart  0-9 Units Subcutaneous TID WC  . loratadine  10 mg Oral Daily  . rosuvastatin  5 mg Oral q1800  . sodium chloride flush  10-40 mL Intracatheter Q12H  . sodium chloride flush  3 mL Intravenous Q12H  . tamsulosin  0.4 mg Oral Daily  . traZODone  100 mg Oral QHS  . vancomycin  750 mg Intravenous Q12H    Infusions: . amiodarone 30 mg/hr (07/18/16 0700)  . heparin 1,400 Units/hr (07/18/16 0700)  . milrinone 0.25 mcg/kg/min (07/18/16 0740)    PRN Medications: sodium chloride, [DISCONTINUED] acetaminophen **OR** acetaminophen, acetaminophen, bisacodyl, diphenhydrAMINE, HYDROcodone-acetaminophen, hydrocortisone cream, morphine injection, nortriptyline, ondansetron **OR** ondansetron (ZOFRAN) IV, opium-belladonna, polyethylene glycol, sodium chloride flush, sodium chloride flush   Assessment:   1. Cardiogenic Shock- TEE with EF 30-35% 2. Acute Respiratory Failure 3. A fib RVR 4. CAD -  2 vessel disease 5. Valvular Heart Disease-  Severe AS/moderate-severe MR (MR may be functional).  6. Caroltid Stenosis- Severe carotid stenosis RICA >80% stenosis by carotid dopplers, CTA head/neck with critical >90% RICA stenosis an [id number] LICA.     --s/p R carotid stent 8/28 7. Bilateral Effusions  8. Nasopharyngeal mass 9. Rash- 10. Hyperkalemia 11. Hematuria 12. UTI 13. VT  Plan/Discussion:    S/p R carotid stent 07/11/16. Continue ASA (allergic to plavix and too high risk for Brilinta.)  VT quiescent on IV amio.   Afib with good rate control - occasionally bradycardic. Digoxin stopped.   Tolerating heparin without recurrent hematuria. CT 07/04/16 with bilateral kidney lesions. Felt  to be simple cysts.     Shivering/chills improved. Bcx - NGTD. On empiric vancomycin for TAVR. WBC stable.   Co-ox and CVP look good on reduced dose milrinone. Renal function stable. Hope to wean milrinone after TAVR.  No BB with low output. Will eventually consider ACEi.   2 vessel CAD, on ASA 81 and low dose Crestor (prior myalgias with simvastatin).   Continue with plan for TAVR tomorrow.   Continue PT  Length of Stay: 41  Yailen Zemaitis,MD 9:13 AM Advanced Heart Failure Team Pager [phone] (M-F; 7a - 4p)  Please contact [facility] for night-coverage after hours (4p -7a ) and weekends on [email or web address]

## 2016-07-18 NOTE — Progress Notes (Signed)
PT Cancellation Note  Patient Details Name: [name] [name] MRN: [id number] DOB: 24-Nov-1931   Cancelled Treatment:    Reason Eval/Treat Not Completed: Other (comment)   RN confirmed patient is to have TAVR surgery tomorrow. RN stated patient was up to chair earlier today. Room full of family visiting with patient pre-surgery. PT deferred at this time. Please re-order PT post-op.   [name] 07/18/2016, 5:14 PM Pager [phone]

## 2016-07-19 ENCOUNTER — Other Ambulatory Visit (HOSPITAL_COMMUNITY): Payer: Medicare Other

## 2016-07-19 ENCOUNTER — Inpatient Hospital Stay (HOSPITAL_COMMUNITY): Payer: Medicare Other | Admitting: Certified Registered"

## 2016-07-19 ENCOUNTER — Inpatient Hospital Stay (HOSPITAL_COMMUNITY): Payer: Medicare Other

## 2016-07-19 ENCOUNTER — Encounter (HOSPITAL_COMMUNITY): Payer: Self-pay | Admitting: Certified Registered"

## 2016-07-19 ENCOUNTER — Encounter (HOSPITAL_COMMUNITY)
Admission: EM | Disposition: A | Discharge status: Skilled Nursing Facility | Payer: Self-pay | Source: Home / Self Care | Attending: Internal Medicine

## 2016-07-19 DIAGNOSIS — Z006 Encounter for examination for normal comparison and control in clinical research program: Secondary | ICD-10-CM

## 2016-07-19 DIAGNOSIS — Z952 Presence of prosthetic heart valve: Secondary | ICD-10-CM

## 2016-07-19 DIAGNOSIS — I340 Nonrheumatic mitral (valve) insufficiency: Secondary | ICD-10-CM | POA: Diagnosis present

## 2016-07-19 DIAGNOSIS — Z954 Presence of other heart-valve replacement: Secondary | ICD-10-CM

## 2016-07-19 DIAGNOSIS — I482 Chronic atrial fibrillation: Secondary | ICD-10-CM

## 2016-07-19 DIAGNOSIS — I350 Nonrheumatic aortic (valve) stenosis: Secondary | ICD-10-CM

## 2016-07-19 HISTORY — DX: Presence of prosthetic heart valve: Z95.2

## 2016-07-19 HISTORY — PX: TEE WITHOUT CARDIOVERSION: SHX5443

## 2016-07-19 HISTORY — PX: TRANSCATHETER AORTIC VALVE REPLACEMENT, TRANSFEMORAL: SHX6400

## 2016-07-19 LAB — POCT I-STAT, CHEM 8
BUN: 8 mg/dL (ref 6–20)
BUN: 8 mg/dL (ref 6–20)
BUN: 8 mg/dL (ref 6–20)
BUN: 7 mg/dL (ref 6–20)
CALCIUM ION: 1.19 mmol/L (ref 1.15–1.40)
CALCIUM ION: 1.19 mmol/L (ref 1.15–1.40)
CHLORIDE: 100 mmol/L — AB (ref 101–111)
CHLORIDE: 99 mmol/L — AB (ref 101–111)
CREATININE: 0.9 mg/dL (ref 0.61–1.24)
CREATININE: 1.1 mg/dL (ref 0.61–1.24)
Calcium, Ion: 1.19 mmol/L (ref 1.15–1.40)
Calcium, Ion: 1.12 mmol/L — ABNORMAL LOW (ref 1.15–1.40)
Chloride: 100 mmol/L — ABNORMAL LOW (ref 101–111)
Chloride: 101 mmol/L (ref 101–111)
Creatinine, Ser: 0.9 mg/dL (ref 0.61–1.24)
Creatinine, Ser: 1 mg/dL (ref 0.61–1.24)
GLUCOSE: 159 mg/dL — AB (ref 65–99)
GLUCOSE: 137 mg/dL — AB (ref 65–99)
Glucose, Bld: 179 mg/dL — ABNORMAL HIGH (ref 65–99)
Glucose, Bld: 181 mg/dL — ABNORMAL HIGH (ref 65–99)
HCT: 29 % — ABNORMAL LOW (ref 39.0–52.0)
HCT: 33 % — ABNORMAL LOW (ref 39.0–52.0)
HEMATOCRIT: 30 % — AB (ref 39.0–52.0)
HEMATOCRIT: 29 % — AB (ref 39.0–52.0)
HEMOGLOBIN: 10.2 g/dL — AB (ref 13.0–17.0)
Hemoglobin: 9.9 g/dL — ABNORMAL LOW (ref 13.0–17.0)
Hemoglobin: 9.9 g/dL — ABNORMAL LOW (ref 13.0–17.0)
Hemoglobin: 11.2 g/dL — ABNORMAL LOW (ref 13.0–17.0)
POTASSIUM: 3.7 mmol/L (ref 3.5–5.1)
Potassium: 3.9 mmol/L (ref 3.5–5.1)
Potassium: 4.2 mmol/L (ref 3.5–5.1)
Potassium: 4.5 mmol/L (ref 3.5–5.1)
SODIUM: 135 mmol/L (ref 135–145)
SODIUM: 137 mmol/L (ref 135–145)
SODIUM: 138 mmol/L (ref 135–145)
Sodium: 135 mmol/L (ref 135–145)
TCO2: 26 mmol/L (ref 0–100)
TCO2: 27 mmol/L (ref 0–100)
TCO2: 24 mmol/L (ref 0–100)
TCO2: 28 mmol/L (ref 0–100)

## 2016-07-19 LAB — POCT I-STAT 3, ART BLOOD GAS (G3+)
ACID-BASE EXCESS: 1 mmol/L (ref 0.0–2.0)
Acid-base deficit: 2 mmol/L (ref 0.0–2.0)
Bicarbonate: 26.1 mmol/L (ref 20.0–28.0)
Bicarbonate: 23.4 mmol/L (ref 20.0–28.0)
O2 SAT: 100 %
O2 SAT: 96 %
PCO2 ART: 37.7 mmHg (ref 32.0–48.0)
PO2 ART: 269 mmHg — AB (ref 83.0–108.0)
PO2 ART: 74 mmHg — AB (ref 83.0–108.0)
TCO2: 27 mmol/L (ref 0–100)
TCO2: 25 mmol/L (ref 0–100)
pCO2 arterial: 43.6 mmHg (ref 32.0–48.0)
pH, Arterial: 7.385 (ref 7.350–7.450)
pH, Arterial: 7.394 (ref 7.350–7.450)

## 2016-07-19 LAB — POCT I-STAT 4, (NA,K, GLUC, HGB,HCT)
Glucose, Bld: 181 mg/dL — ABNORMAL HIGH (ref 65–99)
HEMATOCRIT: 29 % — AB (ref 39.0–52.0)
Hemoglobin: 9.9 g/dL — ABNORMAL LOW (ref 13.0–17.0)
Potassium: 4 mmol/L (ref 3.5–5.1)
SODIUM: 138 mmol/L (ref 135–145)

## 2016-07-19 LAB — BLOOD GAS, ARTERIAL
Acid-Base Excess: 1.9 mmol/L (ref 0.0–2.0)
Bicarbonate: 25.4 mmol/L (ref 20.0–28.0)
DRAWN BY: 449561
FIO2: 21
O2 SAT: 96.4 %
PATIENT TEMPERATURE: 98.6
PCO2 ART: 36 mmHg (ref 32.0–48.0)
pH, Arterial: 7.462 — ABNORMAL HIGH (ref 7.350–7.450)
pO2, Arterial: 80.7 mmHg — ABNORMAL LOW (ref 83.0–108.0)

## 2016-07-19 LAB — MAGNESIUM
MAGNESIUM: 1.8 mg/dL (ref 1.7–2.4)
MAGNESIUM: 2.7 mg/dL — AB (ref 1.7–2.4)

## 2016-07-19 LAB — CARBOXYHEMOGLOBIN
Carboxyhemoglobin: 0.9 % (ref 0.5–1.5)
Methemoglobin: 0.8 % (ref 0.0–1.5)
O2 Saturation: 48.5 %
TOTAL HEMOGLOBIN: 12.8 g/dL (ref 12.0–16.0)

## 2016-07-19 LAB — PROTIME-INR
INR: 1.19
PROTHROMBIN TIME: 15.2 s (ref 11.4–15.2)

## 2016-07-19 LAB — CBC
HCT: 30.6 % — ABNORMAL LOW (ref 39.0–52.0)
HCT: 33.4 % — ABNORMAL LOW (ref 39.0–52.0)
HEMATOCRIT: 30.5 % — AB (ref 39.0–52.0)
HEMOGLOBIN: 10.3 g/dL — AB (ref 13.0–17.0)
HEMOGLOBIN: 10.2 g/dL — AB (ref 13.0–17.0)
HEMOGLOBIN: 11 g/dL — AB (ref 13.0–17.0)
MCH: 29.9 pg (ref 26.0–34.0)
MCH: 29.8 pg (ref 26.0–34.0)
MCH: 29.4 pg (ref 26.0–34.0)
MCHC: 33.7 g/dL (ref 30.0–36.0)
MCHC: 33.4 g/dL (ref 30.0–36.0)
MCHC: 32.9 g/dL (ref 30.0–36.0)
MCV: 89 fL (ref 78.0–100.0)
MCV: 89.2 fL (ref 78.0–100.0)
MCV: 89.3 fL (ref 78.0–100.0)
Platelets: 139 10*3/uL — ABNORMAL LOW (ref 150–400)
Platelets: 122 10*3/uL — ABNORMAL LOW (ref 150–400)
Platelets: 132 10*3/uL — ABNORMAL LOW (ref 150–400)
RBC: 3.44 MIL/uL — ABNORMAL LOW (ref 4.22–5.81)
RBC: 3.42 MIL/uL — ABNORMAL LOW (ref 4.22–5.81)
RBC: 3.74 MIL/uL — ABNORMAL LOW (ref 4.22–5.81)
RDW: 14.7 % (ref 11.5–15.5)
RDW: 14.6 % (ref 11.5–15.5)
RDW: 14.3 % (ref 11.5–15.5)
WBC: 6.2 10*3/uL (ref 4.0–10.5)
WBC: 6.2 10*3/uL (ref 4.0–10.5)
WBC: 6.3 10*3/uL (ref 4.0–10.5)

## 2016-07-19 LAB — BASIC METABOLIC PANEL
ANION GAP: 4 — AB (ref 5–15)
BUN: 8 mg/dL (ref 6–20)
CHLORIDE: 104 mmol/L (ref 101–111)
CO2: 28 mmol/L (ref 22–32)
Calcium: 8.6 mg/dL — ABNORMAL LOW (ref 8.9–10.3)
Creatinine, Ser: 1.14 mg/dL (ref 0.61–1.24)
GFR calc Af Amer: >60 mL/min (ref >60)
GFR calc non Af Amer: 58 mL/min — ABNORMAL LOW (ref >60)
Glucose, Bld: 158 mg/dL — ABNORMAL HIGH (ref 65–99)
POTASSIUM: 4 mmol/L (ref 3.5–5.1)
SODIUM: 136 mmol/L (ref 135–145)

## 2016-07-19 LAB — GLUCOSE, CAPILLARY
GLUCOSE-CAPILLARY: 111 mg/dL — AB (ref 65–99)
GLUCOSE-CAPILLARY: 122 mg/dL — AB (ref 65–99)
GLUCOSE-CAPILLARY: 140 mg/dL — AB (ref 65–99)
GLUCOSE-CAPILLARY: 170 mg/dL — AB (ref 65–99)
GLUCOSE-CAPILLARY: 507 mg/dL — AB (ref 65–99)

## 2016-07-19 LAB — APTT: aPTT: 44 seconds — ABNORMAL HIGH (ref 24–36)

## 2016-07-19 LAB — CREATININE, SERUM
Creatinine, Ser: 1.12 mg/dL (ref 0.61–1.24)
GFR calc Af Amer: >60 mL/min (ref >60)
GFR calc non Af Amer: 59 mL/min — ABNORMAL LOW (ref >60)

## 2016-07-19 LAB — HEPARIN LEVEL (UNFRACTIONATED): HEPARIN UNFRACTIONATED: 0.39 [IU]/mL (ref 0.30–0.70)

## 2016-07-19 SURGERY — IMPLANTATION, AORTIC VALVE, TRANSCATHETER, FEMORAL APPROACH
Anesthesia: General | Site: Chest

## 2016-07-19 MED ORDER — SODIUM CHLORIDE 0.9% FLUSH: 3.0000 mL | INTRAVENOUS | Status: DC | PRN

## 2016-07-19 MED ORDER — SUGAMMADEX SODIUM 200 MG/2ML IV SOLN
INTRAVENOUS | Status: DC | PRN
  Administered 2016-07-19: 200 mg via INTRAVENOUS

## 2016-07-19 MED ORDER — MIDAZOLAM HCL 2 MG/2ML IJ SOLN: 2.0000 mg | INTRAMUSCULAR | Status: DC | PRN

## 2016-07-19 MED ORDER — ONDANSETRON HCL 4 MG/2ML IJ SOLN: 4.0000 mg | Freq: Four times a day (QID) | INTRAMUSCULAR | Status: DC | PRN

## 2016-07-19 MED ORDER — POTASSIUM CHLORIDE 10 MEQ/50ML IV SOLN
10.0000 meq | INTRAVENOUS | Status: AC | PRN
  Administered 2016-07-19 (×3): 10 meq via INTRAVENOUS
  Filled 2016-07-19 (×3): qty 50

## 2016-07-19 MED ORDER — METOPROLOL TARTRATE 5 MG/5ML IV SOLN: 2.5000 mg | INTRAVENOUS | Status: DC | PRN

## 2016-07-19 MED ORDER — DEXTROSE 5 % IV SOLN
0.0000 ug/min | INTRAVENOUS | Status: DC
  Administered 2016-07-20: 10 ug/min via INTRAVENOUS
  Filled 2016-07-19: qty 2

## 2016-07-19 MED ORDER — ACETAMINOPHEN 160 MG/5ML PO SOLN: 650.0000 mg | Freq: Once | ORAL | Status: AC

## 2016-07-19 MED ORDER — ETOMIDATE 2 MG/ML IV SOLN
INTRAVENOUS | Status: AC
  Filled 2016-07-19: qty 10

## 2016-07-19 MED ORDER — PANTOPRAZOLE SODIUM 40 MG PO TBEC
40.0000 mg | DELAYED_RELEASE_TABLET | Freq: Every day | ORAL | Status: DC
  Administered 2016-07-20 – 2016-07-23 (×4): 40 mg via ORAL
  Filled 2016-07-19 (×4): qty 1

## 2016-07-19 MED ORDER — ASPIRIN EC 325 MG PO TBEC
325.0000 mg | DELAYED_RELEASE_TABLET | Freq: Every day | ORAL | Status: DC
  Administered 2016-07-20 – 2016-07-21 (×2): 325 mg via ORAL
  Filled 2016-07-19 (×2): qty 1

## 2016-07-19 MED ORDER — LACTATED RINGERS IV SOLN: INTRAVENOUS | Status: DC

## 2016-07-19 MED ORDER — VANCOMYCIN HCL IN DEXTROSE 1-5 GM/200ML-% IV SOLN
1000.0000 mg | Freq: Once | INTRAVENOUS | Status: AC
  Administered 2016-07-19: 1000 mg via INTRAVENOUS
  Filled 2016-07-19: qty 200

## 2016-07-19 MED ORDER — SODIUM CHLORIDE 0.9 % IV SOLN
0.0125 ug/kg/min | INTRAVENOUS | Status: DC
  Filled 2016-07-19: qty 2000

## 2016-07-19 MED ORDER — PROTAMINE SULFATE 10 MG/ML IV SOLN
INTRAVENOUS | Status: DC | PRN
  Administered 2016-07-19: 100 mg via INTRAVENOUS

## 2016-07-19 MED ORDER — 0.9 % SODIUM CHLORIDE (POUR BTL) OPTIME
TOPICAL | Status: DC | PRN
  Administered 2016-07-19: 3000 mL

## 2016-07-19 MED ORDER — NOREPINEPHRINE BITARTRATE 1 MG/ML IV SOLN
0.0000 ug/min | INTRAVENOUS | Status: DC
  Filled 2016-07-19: qty 4

## 2016-07-19 MED ORDER — NITROGLYCERIN IN D5W 200-5 MCG/ML-% IV SOLN
0.0000 ug/min | INTRAVENOUS | Status: DC
  Administered 2016-07-19: 10 ug/min via INTRAVENOUS
  Filled 2016-07-19: qty 250

## 2016-07-19 MED ORDER — ALBUMIN HUMAN 5 % IV SOLN: 250.0000 mL | INTRAVENOUS | Status: DC | PRN

## 2016-07-19 MED ORDER — 0.9 % SODIUM CHLORIDE (POUR BTL) OPTIME
TOPICAL | Status: DC | PRN
  Administered 2016-07-19: 1000 mL

## 2016-07-19 MED ORDER — ROCURONIUM BROMIDE 10 MG/ML (PF) SYRINGE
PREFILLED_SYRINGE | INTRAVENOUS | Status: AC
  Filled 2016-07-19: qty 10

## 2016-07-19 MED ORDER — HEPARIN SODIUM (PORCINE) 1000 UNIT/ML IJ SOLN
INTRAMUSCULAR | Status: DC | PRN
  Administered 2016-07-19: 11000 [IU] via INTRAVENOUS

## 2016-07-19 MED ORDER — LIDOCAINE 2% (20 MG/ML) 5 ML SYRINGE
INTRAMUSCULAR | Status: AC
  Filled 2016-07-19: qty 5

## 2016-07-19 MED ORDER — SODIUM CHLORIDE 0.9 % IV SOLN: 250.0000 mL | INTRAVENOUS | Status: DC | PRN

## 2016-07-19 MED ORDER — AMIODARONE HCL IN DEXTROSE 360-4.14 MG/200ML-% IV SOLN
30.0000 mg/h | INTRAVENOUS | Status: DC
  Administered 2016-07-19 – 2016-07-20 (×4): 30 mg/h via INTRAVENOUS
  Filled 2016-07-19 (×3): qty 200

## 2016-07-19 MED ORDER — HEPARIN SODIUM (PORCINE) 1000 UNIT/ML IJ SOLN
INTRAMUSCULAR | Status: AC
  Filled 2016-07-19: qty 1

## 2016-07-19 MED ORDER — INSULIN ASPART 100 UNIT/ML ~~LOC~~ SOLN
0.0000 [IU] | SUBCUTANEOUS | Status: DC
  Administered 2016-07-19: 4 [IU] via SUBCUTANEOUS
  Administered 2016-07-19: 2 [IU] via SUBCUTANEOUS
  Administered 2016-07-19: 4 [IU] via SUBCUTANEOUS
  Administered 2016-07-20: 2 [IU] via SUBCUTANEOUS

## 2016-07-19 MED ORDER — MORPHINE SULFATE (PF) 2 MG/ML IV SOLN
2.0000 mg | INTRAVENOUS | Status: DC | PRN
  Administered 2016-07-20: 2 mg via INTRAVENOUS
  Filled 2016-07-19: qty 1

## 2016-07-19 MED ORDER — LACTATED RINGERS IV SOLN
500.0000 mL | Freq: Once | INTRAVENOUS | Status: AC | PRN
  Administered 2016-07-19: 20 mL via INTRAVENOUS

## 2016-07-19 MED ORDER — IODIXANOL 320 MG/ML IV SOLN
INTRAVENOUS | Status: DC | PRN
  Administered 2016-07-19: 45.1 mL via INTRAVENOUS

## 2016-07-19 MED ORDER — CHLORHEXIDINE GLUCONATE 0.12 % MT SOLN
15.0000 mL | OROMUCOSAL | Status: AC
Start: 2016-07-19 — End: 2016-07-19
  Administered 2016-07-19: 15 mL via OROMUCOSAL

## 2016-07-19 MED ORDER — REMIFENTANIL HCL 1 MG IV SOLR
INTRAVENOUS | Status: DC | PRN
  Administered 2016-07-19: .1 ug/kg/min via INTRAVENOUS

## 2016-07-19 MED ORDER — INSULIN REGULAR BOLUS VIA INFUSION
0.0000 [IU] | Freq: Three times a day (TID) | INTRAVENOUS | Status: DC
  Filled 2016-07-19: qty 10

## 2016-07-19 MED ORDER — PROTAMINE SULFATE 10 MG/ML IV SOLN
INTRAVENOUS | Status: AC
  Filled 2016-07-19: qty 10

## 2016-07-19 MED ORDER — SUGAMMADEX SODIUM 200 MG/2ML IV SOLN
INTRAVENOUS | Status: AC
  Filled 2016-07-19: qty 2

## 2016-07-19 MED ORDER — SODIUM CHLORIDE 0.9 % IV SOLN
1.0000 mL/kg/h | INTRAVENOUS | Status: DC
  Administered 2016-07-19: 1 mL/kg/h via INTRAVENOUS

## 2016-07-19 MED ORDER — SODIUM CHLORIDE 0.9% FLUSH
3.0000 mL | Freq: Two times a day (BID) | INTRAVENOUS | Status: DC
  Administered 2016-07-19 – 2016-07-22 (×6): 3 mL via INTRAVENOUS

## 2016-07-19 MED ORDER — FENTANYL CITRATE (PF) 250 MCG/5ML IJ SOLN
INTRAMUSCULAR | Status: AC
  Filled 2016-07-19: qty 5

## 2016-07-19 MED ORDER — ROCURONIUM BROMIDE 100 MG/10ML IV SOLN
INTRAVENOUS | Status: DC | PRN
  Administered 2016-07-19: 60 mg via INTRAVENOUS

## 2016-07-19 MED ORDER — FUROSEMIDE 10 MG/ML IJ SOLN
40.0000 mg | Freq: Once | INTRAMUSCULAR | Status: AC
  Administered 2016-07-19: 40 mg via INTRAVENOUS
  Filled 2016-07-19: qty 4

## 2016-07-19 MED ORDER — POTASSIUM CHLORIDE 10 MEQ/50ML IV SOLN: 10.0000 meq | INTRAVENOUS | Status: AC

## 2016-07-19 MED ORDER — PHENYLEPHRINE HCL 10 MG/ML IJ SOLN
INTRAVENOUS | Status: DC | PRN
  Administered 2016-07-19: 60 ug/min via INTRAVENOUS

## 2016-07-19 MED ORDER — DEXTROSE 5 % IV SOLN
1.5000 g | Freq: Two times a day (BID) | INTRAVENOUS | Status: AC
  Administered 2016-07-19 – 2016-07-21 (×4): 1.5 g via INTRAVENOUS
  Filled 2016-07-19 (×4): qty 1.5

## 2016-07-19 MED ORDER — ETOMIDATE 2 MG/ML IV SOLN
INTRAVENOUS | Status: DC | PRN
  Administered 2016-07-19: 4 mg via INTRAVENOUS
  Administered 2016-07-19: 2 mg via INTRAVENOUS
  Administered 2016-07-19: 10 mg via INTRAVENOUS

## 2016-07-19 MED ORDER — ASPIRIN 81 MG PO CHEW: 324.0000 mg | CHEWABLE_TABLET | Freq: Every day | ORAL | Status: DC

## 2016-07-19 MED ORDER — ACETAMINOPHEN 650 MG RE SUPP
650.0000 mg | Freq: Once | RECTAL | Status: AC
  Administered 2016-07-19: 650 mg via RECTAL
  Filled 2016-07-19: qty 1

## 2016-07-19 MED ORDER — NOREPINEPHRINE BITARTRATE 1 MG/ML IV SOLN
INTRAVENOUS | Status: DC | PRN
  Administered 2016-07-19: .5 ug/min via INTRAVENOUS

## 2016-07-19 MED ORDER — LACTATED RINGERS IV SOLN
INTRAVENOUS | Status: DC | PRN
  Administered 2016-07-19: 07:00:00 via INTRAVENOUS

## 2016-07-19 MED ORDER — MILRINONE LACTATE IN DEXTROSE 20-5 MG/100ML-% IV SOLN: 0.3000 ug/kg/min | INTRAVENOUS | Status: DC

## 2016-07-19 MED ORDER — FAMOTIDINE IN NACL 20-0.9 MG/50ML-% IV SOLN
20.0000 mg | Freq: Two times a day (BID) | INTRAVENOUS | Status: DC
  Administered 2016-07-19: 20 mg via INTRAVENOUS
  Filled 2016-07-19: qty 50

## 2016-07-19 MED ORDER — MAGNESIUM SULFATE 4 GM/100ML IV SOLN
4.0000 g | Freq: Once | INTRAVENOUS | Status: AC
  Administered 2016-07-19: 4 g via INTRAVENOUS
  Filled 2016-07-19: qty 100

## 2016-07-19 MED ORDER — INSULIN REGULAR HUMAN 100 UNIT/ML IJ SOLN
INTRAMUSCULAR | Status: DC
  Filled 2016-07-19 (×2): qty 2.5

## 2016-07-19 MED ORDER — SODIUM CHLORIDE 0.9 % IV SOLN
INTRAVENOUS | Status: DC | PRN
  Administered 2016-07-19 (×3): 500 mL

## 2016-07-19 MED FILL — Potassium Chloride Inj 2 mEq/ML: INTRAVENOUS | Qty: 40 | Status: AC

## 2016-07-19 MED FILL — Heparin Sodium (Porcine) Inj 1000 Unit/ML: INTRAMUSCULAR | Qty: 30 | Status: AC

## 2016-07-19 MED FILL — Magnesium Sulfate Inj 50%: INTRAMUSCULAR | Qty: 10 | Status: AC

## 2016-07-19 SURGICAL SUPPLY — 110 items
ADAPTER UNIV SWAN GANZ BIP (ADAPTER) ×2 IMPLANT
ADAPTER UNV SWAN GANZ BIP (ADAPTER) ×2
ADH SKN CLS APL DERMABOND .7 (GAUZE/BANDAGES/DRESSINGS) ×2
ADH SKN CLS LQ APL DERMABOND (GAUZE/BANDAGES/DRESSINGS) ×2
ADPR CATH UNV NS SG CATH (ADAPTER) ×2
ATTRACTOMAT 16X20 MAGNETIC DRP (DRAPES) IMPLANT
BAG BANDED W/RUBBER/TAPE 36X54 (MISCELLANEOUS) ×4 IMPLANT
BAG DECANTER FOR FLEXI CONT (MISCELLANEOUS) ×2 IMPLANT
BAG EQP BAND 135X91 W/RBR TAPE (MISCELLANEOUS) ×2
BAG SNAP BAND KOVER 36X36 (MISCELLANEOUS) ×6 IMPLANT
BLADE 10 SAFETY STRL DISP (BLADE) ×2 IMPLANT
BLADE STERNUM SYSTEM 6 (BLADE) ×2 IMPLANT
BLADE SURG ROTATE 9660 (MISCELLANEOUS) IMPLANT
CABLE PACING FASLOC BIEGE (MISCELLANEOUS) ×2 IMPLANT
CABLE PACING FASLOC BLUE (MISCELLANEOUS) ×4 IMPLANT
CANISTER SUCTION 2500CC (MISCELLANEOUS) ×2 IMPLANT
CANNULA FEM VENOUS REMOTE 22FR (CANNULA) IMPLANT
CANNULA OPTISITE PERFUSION 16F (CANNULA) IMPLANT
CANNULA OPTISITE PERFUSION 18F (CANNULA) IMPLANT
CATH DIAG EXPO 6F VENT PIG 145 (CATHETERS) ×8 IMPLANT
CATH EXPO 5FR AL1 (CATHETERS) ×4 IMPLANT
CATH S G BIP PACING (SET/KITS/TRAYS/PACK) ×8 IMPLANT
CLIP TI MEDIUM 24 (CLIP) ×2 IMPLANT
CLIP TI WIDE RED SMALL 24 (CLIP) ×2 IMPLANT
CONT SPEC 4OZ CLIKSEAL STRL BL (MISCELLANEOUS) ×12 IMPLANT
COVER DOME SNAP 22 D (MISCELLANEOUS) ×6 IMPLANT
COVER MAYO STAND STRL (DRAPES) ×4 IMPLANT
COVER TABLE BACK 60X90 (DRAPES) ×4 IMPLANT
CRADLE DONUT ADULT HEAD (MISCELLANEOUS) ×4 IMPLANT
DERMABOND ADHESIVE PROPEN (GAUZE/BANDAGES/DRESSINGS) ×2
DERMABOND ADVANCED (GAUZE/BANDAGES/DRESSINGS) ×2
DERMABOND ADVANCED .7 DNX12 (GAUZE/BANDAGES/DRESSINGS) ×2 IMPLANT
DERMABOND ADVANCED .7 DNX6 (GAUZE/BANDAGES/DRESSINGS) IMPLANT
DEVICE CLOSURE PERCLS PRGLD 6F (VASCULAR PRODUCTS) IMPLANT
DRAPE INCISE IOBAN 66X45 STRL (DRAPES) IMPLANT
DRAPE SLUSH MACHINE 52X66 (DRAPES) ×4 IMPLANT
DRAPE TABLE COVER HEAVY DUTY (DRAPES) ×4 IMPLANT
DRSG TEGADERM 4X4.75 (GAUZE/BANDAGES/DRESSINGS) ×4 IMPLANT
ELECT REM PT RETURN 9FT ADLT (ELECTROSURGICAL) ×8
ELECTRODE REM PT RTRN 9FT ADLT (ELECTROSURGICAL) ×4 IMPLANT
FELT TEFLON 6X6 (MISCELLANEOUS) ×4 IMPLANT
FEMORAL VENOUS CANN RAP (CANNULA) IMPLANT
GAUZE SPONGE 2X2 8PLY STRL LF (GAUZE/BANDAGES/DRESSINGS) IMPLANT
GAUZE SPONGE 4X4 12PLY STRL (GAUZE/BANDAGES/DRESSINGS) ×4 IMPLANT
GLOVE BIO SURGEON STRL SZ 6.5 (GLOVE) ×2 IMPLANT
GLOVE BIO SURGEON STRL SZ7.5 (GLOVE) ×4 IMPLANT
GLOVE BIO SURGEON STRL SZ8 (GLOVE) ×10 IMPLANT
GLOVE BIO SURGEONS STRL SZ 6.5 (GLOVE) ×2
GLOVE ECLIPSE 7.5 STRL STRAW (GLOVE) ×4 IMPLANT
GLOVE EUDERMIC 7 POWDERFREE (GLOVE) ×4 IMPLANT
GLOVE ORTHO TXT STRL SZ7.5 (GLOVE) ×4 IMPLANT
GOWN STRL REUS W/ TWL LRG LVL3 (GOWN DISPOSABLE) ×6 IMPLANT
GOWN STRL REUS W/ TWL XL LVL3 (GOWN DISPOSABLE) ×12 IMPLANT
GOWN STRL REUS W/TWL LRG LVL3 (GOWN DISPOSABLE) ×16
GOWN STRL REUS W/TWL XL LVL3 (GOWN DISPOSABLE) ×24
GUIDEWIRE SAF TJ AMPL .035X180 (WIRE) ×4 IMPLANT
GUIDEWIRE SAFE TJ AMPLATZ EXST (WIRE) ×4 IMPLANT
GUIDEWIRE STRAIGHT .035 260CM (WIRE) ×4 IMPLANT
INSERT FOGARTY 61MM (MISCELLANEOUS) ×2 IMPLANT
INSERT FOGARTY SM (MISCELLANEOUS) ×4 IMPLANT
INSERT FOGARTY XLG (MISCELLANEOUS) IMPLANT
KIT BASIN OR (CUSTOM PROCEDURE TRAY) ×4 IMPLANT
KIT DILATOR VASC 18G NDL (KITS) IMPLANT
KIT HEART LEFT (KITS) ×4 IMPLANT
KIT ROOM TURNOVER OR (KITS) ×4 IMPLANT
KIT SUCTION CATH 14FR (SUCTIONS) ×8 IMPLANT
NDL PERC 18GX7CM (NEEDLE) ×2 IMPLANT
NEEDLE PERC 18GX7CM (NEEDLE) ×4 IMPLANT
NS IRRIG 1000ML POUR BTL (IV SOLUTION) ×12 IMPLANT
PACK AORTA (CUSTOM PROCEDURE TRAY) ×4 IMPLANT
PAD ARMBOARD 7.5X6 YLW CONV (MISCELLANEOUS) ×8 IMPLANT
PAD ELECT DEFIB RADIOL ZOLL (MISCELLANEOUS) ×4 IMPLANT
PATCH TACHOSII LRG 9.5X4.8 (VASCULAR PRODUCTS) IMPLANT
PERCLOSE PROGLIDE 6F (VASCULAR PRODUCTS) ×8
SET MICROPUNCTURE 5F STIFF (MISCELLANEOUS) ×4 IMPLANT
SHEATH AVANTI 11CM 8FR (MISCELLANEOUS) ×4 IMPLANT
SHEATH PINNACLE 6F 10CM (SHEATH) ×8 IMPLANT
SLEEVE REPOSITIONING LENGTH 30 (MISCELLANEOUS) ×4 IMPLANT
SPONGE GAUZE 2X2 STER 10/PKG (GAUZE/BANDAGES/DRESSINGS) ×2
SPONGE GAUZE 4X4 12PLY STER LF (GAUZE/BANDAGES/DRESSINGS) ×2 IMPLANT
SPONGE LAP 4X18 X RAY DECT (DISPOSABLE) ×4 IMPLANT
STOPCOCK MORSE 400PSI 3WAY (MISCELLANEOUS) ×24 IMPLANT
SUT ETHIBOND X763 2 0 SH 1 (SUTURE) ×2 IMPLANT
SUT GORETEX CV 4 TH 22 36 (SUTURE) ×2 IMPLANT
SUT GORETEX CV4 TH-18 (SUTURE) ×6 IMPLANT
SUT GORETEX TH-18 36 INCH (SUTURE) ×4 IMPLANT
SUT MNCRL AB 3-0 PS2 18 (SUTURE) ×2 IMPLANT
SUT PROLENE 3 0 SH1 36 (SUTURE) IMPLANT
SUT PROLENE 4 0 RB 1 (SUTURE)
SUT PROLENE 4-0 RB1 .5 CRCL 36 (SUTURE) ×2 IMPLANT
SUT PROLENE 5 0 C 1 36 (SUTURE) ×4 IMPLANT
SUT PROLENE 6 0 C 1 30 (SUTURE) ×4 IMPLANT
SUT SILK  1 MH (SUTURE) ×2
SUT SILK 1 MH (SUTURE) ×2 IMPLANT
SUT SILK 2 0 SH CR/8 (SUTURE) IMPLANT
SUT VIC AB 2-0 CT1 27 (SUTURE)
SUT VIC AB 2-0 CT1 TAPERPNT 27 (SUTURE) ×2 IMPLANT
SUT VIC AB 2-0 CTX 36 (SUTURE) IMPLANT
SUT VIC AB 3-0 SH 8-18 (SUTURE) ×4 IMPLANT
SYR 30ML LL (SYRINGE) ×8 IMPLANT
SYR 50ML LL SCALE MARK (SYRINGE) ×4 IMPLANT
SYRINGE 10CC LL (SYRINGE) ×12 IMPLANT
TOWEL OR 17X26 10 PK STRL BLUE (TOWEL DISPOSABLE) ×8 IMPLANT
TRANSDUCER W/STOPCOCK (MISCELLANEOUS) ×8 IMPLANT
TRAY FOLEY IC TEMP SENS 14FR (CATHETERS) ×4 IMPLANT
TUBE SUCT INTRACARD DLP 20F (MISCELLANEOUS) IMPLANT
TUBING HIGH PRESSURE 120CM (CONNECTOR) ×4 IMPLANT
VALVE HEART TRANSCATH SZ3 29MM (Prosthesis & Implant Heart) ×2 IMPLANT
WIRE AMPLATZ SS-J .035X180CM (WIRE) ×4 IMPLANT
WIRE BENTSON .035X145CM (WIRE) ×4 IMPLANT

## 2016-07-19 NOTE — Progress Notes (Signed)
Patient ID: [name] [name], male   DOB: April 18, 1932, 82 y.o.   MRN: [id number]   Advanced Heart Failure Rounding Note   Subjective:    Developed frank hematuria 07/10/16. Heparin stopped. UA and urine cytology sent. UA + for blood and WBCs.   S/p R Carotid stent 07/11/16  Urology saw 07/12/16 for hematuria/urinary retention. Thought to be likely due to infection. Cipro x 7 days + flomax. Had CBI catheter which is now out   Had VT on 9/2. Started IV amio and electrolytes supped. Milrinone turned down to 0.125.  VT improved.  Was bradycardic and digoxin stopped (level 1.4) on 9/2.   Earlier today had AVR. On amio drip 30 mg per hour + milrinone 0.25 mcg. CI 1.6 PCWP 22.    Objective:   Weight Range:  Vital Signs:   Temp:  [97.8 F (36.6 C)-98.4 F (36.9 C)] 98 F (36.7 C) (09/05 0400) Pulse Rate:  [45-128] 53 (09/05 1002) Resp:  [14-23] 14 (09/05 0500) BP: (82-126)/(33-65) 104/45 (09/05 0500) SpO2:  [92 %-99 %] 93 % (09/05 0500) Weight:  [207 lb 10.8 oz (94.2 kg)] 207 lb 10.8 oz (94.2 kg) (09/05 0400) Last BM Date: 07/18/16  Weight change: Filed Weights   07/17/16 0500 07/18/16 0500 07/19/16 0400  Weight: 207 lb 3.7 oz (94 kg) 208 lb 8 oz (94.6 kg) 207 lb 10.8 oz (94.2 kg)    Intake/Output:   Intake/Output Summary (Last 24 hours) at 07/19/16 1100 Last data filed at 07/19/16 1023  Gross per 24 hour  Intake          3436.93 ml  Output             1025 ml  Net          2411.93 ml     Physical Exam: General:  Lying in bed.  HEENT: Normal Neck: supple. JVP 7-8 Carotids 2+ bilat; no bruits. No thyromegaly or nodule noted. Cor: PMI nondisplaced. Irregularly irregular No S3.  2/6 systolic crescendo-decrescendo murmur with muffling of S2.   Lungs: CTAB, normal effort on 3 liters [place] Abdomen: soft, NT, ND, no HSM. No bruits or masses. +BS  Extremities: no cyanosis, clubbing, rash, no edema.  Neuro: Sedated  Telemetry: A fib 50s   Labs: Basic Metabolic Panel:  Recent  Labs Lab 07/15/16 0319 07/15/16 1200 07/16/16 0352 07/17/16 0400 07/18/16 0400 07/19/16 0433 07/19/16 0837 07/19/16 0928 07/19/16 0959  NA 134* 127* 133* 133* 131* 136 137 135 135  K 3.6 3.3* 4.2 3.5 3.3* 4.0 3.9 4.2 4.5  CL 98* 91* 101 102 100* 104 101 100* 100*  CO2 23 23 23 25 25 28   --   --   --   GLUCOSE 141* 526* 223* 145* 256* 158* 159* 179* 181*  BUN 18 16 12 14 13 8 8 8 8   CREATININE 1.66* 1.49* 1.38* 1.29* 1.28* 1.14 0.90 0.90 1.10  CALCIUM 9.3 7.6* 8.5* 8.4* 8.2* 8.6*  --   --   --   MG 2.0  --  2.2 1.9 1.8 1.8  --   --   --     Liver Function Tests: No results for input(s): AST, ALT, ALKPHOS, BILITOT, PROT, ALBUMIN in the last 168 hours. No results for input(s): LIPASE, AMYLASE in the last 168 hours. No results for input(s): AMMONIA in the last 168 hours.  CBC:  Recent Labs Lab 07/15/16 0319 07/16/16 0352 07/17/16 0400 07/18/16 0400 07/19/16 [id number] 07/19/16 [id number] 07/19/16 [id number] 07/19/16 ID:[id number]  WBC 6.2 6.1 7.1 6.7 6.2  --   --   --   HGB 11.8* 10.8* 10.3* 10.3* 10.3* 9.9* 10.2* 9.9*  HCT 35.4* 33.2* 31.6* 31.8* 30.6* 29.0* 30.0* 29.0*  MCV 88.5 90.5 90.5 90.6 89.0  --   --   --   PLT 186 149* 151 138* 139*  --   --   --     Cardiac Enzymes: No results for input(s): CKTOTAL, CKMB, CKMBINDEX, TROPONINI in the last 168 hours.  BNP: BNP (last 3 results)  Recent Labs  05/03/16 1305 05/28/16 1617 06/28/16 1316  BNP 232.0* 294.0* 222.0*    ProBNP (last 3 results) No results for input(s): PROBNP in the last 8760 hours.    Other results:  Imaging: No results found.   Medications:     Scheduled Medications: . acetaminophen (TYLENOL) oral liquid 160 mg/5 mL  650 mg Per Tube Once   Or  . acetaminophen  650 mg Rectal Once  . [START ON 07/20/2016] aspirin EC  325 mg Oral Daily   Or  . [START ON 07/20/2016] aspirin  324 mg Per Tube Daily  . cefUROXime (ZINACEF)  IV  1.5 g Intravenous Q12H  . chlorhexidine  15 mL Mouth/Throat NOW  . famotidine  (PEPCID) IV  20 mg Intravenous Q12H  . insulin regular  0-10 Units Intravenous TID WC  . magnesium sulfate  4 g Intravenous Once  . [START ON 07/20/2016] pantoprazole  40 mg Oral Daily  . potassium chloride  10 mEq Intravenous Q1 Hr x 3  . sodium chloride flush  3 mL Intravenous Q12H  . vancomycin  1,000 mg Intravenous Once    Infusions: . sodium chloride    . amiodarone    . insulin (NOVOLIN-R) infusion    . milrinone 0.25 mcg/kg/min (07/19/16 0645)  . nitroGLYCERIN    . norepinephrine (LEVOPHED) Adult infusion    . phenylephrine (NEO-SYNEPHRINE) Adult infusion      PRN Medications: sodium chloride, albumin human, lactated ringers, metoprolol, midazolam, morphine injection, ondansetron (ZOFRAN) IV, sodium chloride flush   Assessment:   1. Cardiogenic Shock- TEE with EF 30-35% 2. Acute Respiratory Failure 3. A fib RVR 4. CAD -  2 vessel disease 5. Valvular Heart Disease-  Severe AS/moderate-severe MR (MR may be functional).  6. Caroltid Stenosis- Severe carotid stenosis RICA >80% stenosis by carotid dopplers, CTA head/neck with critical >90% RICA stenosis an [id number] LICA.     --s/p R carotid stent 8/28 7. Bilateral Effusions  8. Nasopharyngeal mass 9. Rash- 10. Hyperkalemia 11. Hematuria 12. UTI 13. VT 14. S/P AVR  Plan/Discussion:    S/P AVR earlier today. CI 1.6. Increase milrinone 0.375 mcg. PCWP 22. Give one dose of lasix. SBP elevated. Add nitro drip.   S/p R carotid stent 07/11/16. Continue ASA (allergic to plavix and too high risk for Brilinta.)  VT quiescent on IV amio.   Afib with good rate control - occasionally bradycardic.   CT 07/04/16 with bilateral kidney lesions. Felt to be simple cysts.     Bcx - NGTD. On empiric vancomycin for TAVR.  2 vessel CAD, on ASA 81 and low dose Crestor (prior myalgias with simvastatin).    Length of Stay: [place], NP-C  11:00 AM Advanced Heart Failure Team Pager [phone] (M-F; 7a - 4p)  Please contact [facility] for night-coverage after hours (4p -7a ) and weekends on [email or web address]  Patient seen and examined with Darrick Grinder, NP. We discussed all aspects  of the encounter. I agree with the assessment and plan as stated above.   He is immediate post-op from TAVR. He is extubated.   Luiz Blare numbers done personally. PCWP 22 with prominent v-waves (due to MR). Cardiac index low (1.6) on milrinone 0.25. SBP high.   Will increase milrinone to 0.375 (Watch for VT). Start IV NTG. One dose IV lasix now. Continue amiodarone.   [place] remain negative. Continue vanc for now. We will follow closely. Please leave Swan in overnight.    Critical Care Time devoted to patient care services described in this note is 35 Minutes.  Bensimhon, Daniel,MD 11:15 AM

## 2016-07-19 NOTE — Transfer of Care (Signed)
Immediate Anesthesia Transfer of Care Note  Patient: Brain A Plant  Procedure(s) Performed: Procedure(s): TRANSCATHETER AORTIC VALVE REPLACEMENT, TRANSFEMORAL (N/A) TRANSESOPHAGEAL ECHOCARDIOGRAM (TEE) (N/A)  Patient Location: ICU  Anesthesia Type:General  Level of Consciousness: awake, oriented and patient cooperative  Airway & Oxygen Therapy: Patient Spontanous Breathing and Patient connected to nasal cannula oxygen  Post-op Assessment: Report given to RN, Post -op Vital signs reviewed and stable and Patient moving all extremities  Post vital signs: Reviewed and stable  Last Vitals:  Vitals:   07/19/16 0957 07/19/16 1002  BP:    Pulse: (!) 57 (!) 53  Resp:    Temp:      Last Pain:  Vitals:   07/19/16 0400  TempSrc: Oral  PainSc: Asleep      Patients Stated Pain Goal: 3 ([id number])  Complications: No apparent anesthesia complications

## 2016-07-19 NOTE — Anesthesia Procedure Notes (Signed)
Central Venous Catheter Insertion Performed by: anesthesiologist Patient location: Pre-op. Preanesthetic checklist: patient identified, IV checked, site marked, risks and benefits discussed, surgical consent, monitors and equipment checked, pre-op evaluation, timeout performed and anesthesia consent PA cath was placed.[place] type and PA catheter depth:thermodilation and 49PA Cath depth:49 Procedure performed without using ultrasound guided technique. Attempts: 1 Patient tolerated the procedure well with no immediate complications.       

## 2016-07-19 NOTE — Care Management Important Message (Signed)
Important Message  Patient Details  Name: [name] [name] MRN: [id number] Date of Birth: February 24, 1932   Medicare Important Message Given:  Yes    [name] Abena 07/19/2016, 4:55 PM

## 2016-07-19 NOTE — Progress Notes (Signed)
[facility]   MULTIDISCIPLINARY HEART VALVE TEAM   TAVR OPERATIVE NOTE   Date of Procedure:  07/19/2016  Preoperative Diagnosis: Severe Aortic Stenosis   Postoperative Diagnosis: Same   Procedure:    Transcatheter Aortic Valve Replacement - Percutaneous Transfemoral Approach  Edwards Sapien 3 THV (size 29 mm, model # 9600TFX, serial # [id number])   Co-Surgeons:  Valentina Gu. Roxy Manns, MD and Sherren Mocha, MD  Assistants:   Gaye Pollack, MD   Anesthesiologist:  Laurie Panda, MD  Echocardiographer:  Ena Dawley, MD  Pre-operative Echo Findings:  Severe aortic stenosis  Moderate-severe left ventricular systolic dysfunction  Severe MR  Post-operative Echo Findings:  trivial paravalvular leak  unchanged left ventricular systolic function  Unchanged MR  BRIEF CLINICAL NOTE AND INDICATIONS FOR SURGERY  See Dr Guy Sandifer separate and complete note for full details.   During the course of the patient's preoperative work up they have been evaluated comprehensively by a multidisciplinary team of specialists coordinated through the [facility] in the [facility].  They have been demonstrated to suffer from symptomatic severe aortic stenosis as noted above. The patient has been counseled extensively as to the relative risks and benefits of all options for the treatment of severe aortic stenosis including long term medical therapy, conventional surgery for aortic valve replacement, and transcatheter aortic valve replacement.  The patient has been independently evaluated by two cardiac surgeons including Dr. Roxy Manns and Dr. Cyndia Bent, and they are felt to be at prohibitive risk for conventional surgical aortic valve replacement. Both surgeons indicated the patient would be a poor candidate for conventional surgery (predicted risk of mortality >15% and/or predicted risk of permanent morbidity >50%) because of comorbidities  including low-output congestive heart failure on Milrinone, severe LF dysfunction, severe carotid disease.   Based upon review of all of the patient's preoperative diagnostic tests they are felt to be candidate for transcatheter aortic valve replacement using the transfemoral approach as an alternative to high risk conventional surgery.    Following the decision to proceed with transcatheter aortic valve replacement, a discussion has been held regarding what types of management strategies would be attempted intraoperatively in the event of life-threatening complications, including whether or not the patient would be considered a candidate for the use of cardiopulmonary bypass and/or conversion to open sternotomy for attempted surgical intervention.  The patient has been advised of a variety of complications that might develop peculiar to this approach including but not limited to risks of death, stroke, paravalvular leak, aortic dissection or other major vascular complications, aortic annulus rupture, device embolization, cardiac rupture or perforation, acute myocardial infarction, arrhythmia, heart block or bradycardia requiring permanent pacemaker placement, congestive heart failure, respiratory failure, renal failure, pneumonia, infection, other late complications related to structural valve deterioration or migration, or other complications that might ultimately cause a temporary or permanent loss of functional independence or other long term morbidity.  The patient provides full informed consent for the procedure as described and all questions were answered preoperatively.    DETAILS OF THE OPERATIVE PROCEDURE PERIPHERAL ACCESS:   Using the modified Seldinger technique, femoral arterial and venous access was obtained with placement of 6 Fr sheaths on the right side.  A pigtail diagnostic catheter was passed through the right femoral arterial sheath under fluoroscopic guidance into the aortic root.  A  temporary transvenous pacemaker catheter was passed through the right femoral venous sheath under fluoroscopic guidance into the right ventricle.  The  pacemaker was tested to ensure stable lead placement and pacemaker capture. Aortic root angiography was performed in order to determine the optimal angiographic angle for valve deployment.  TRANSFEMORAL ACCESS:  A micropuncture technique is used to access the left femoral artery under fluoroscopic guidance.  Femoral angiography is performed to verify access in the common femoral artery. 2 Perclose devices are deployed at 10' and 2' positions to 'PreClose' the femoral artery. An 8 French sheath is placed and then an Amplatz Superstiff wire is advanced through the sheath. This is changed out for a 16 French transfemoral E-Sheath after progressively dilating over the Superstiff wire.    See Dr Guy Sandifer complete note for details of TAVR valve deployment.  PROCEDURE COMPLETION:  The sheath was removed and femoral artery closure is performed using the 2 previously deployed Perclose devices. There was complete hemostasis following sheath removal.  Protamine was administered once femoral arterial repair was complete. The temporary pacemaker, pigtail catheters and femoral sheaths were removed with manual pressure used for hemostasis.   The patient tolerated the procedure well and is transported to the surgical intensive care in stable condition. There were no immediate intraoperative complications. All sponge instrument and needle counts are verified correct at completion of the operation.   The patient received a total of 40 mL of intravenous contrast during the procedure.  Sherren Mocha, MD 07/19/2016 11:35 AM

## 2016-07-19 NOTE — Progress Notes (Signed)
CT surgery p.m. Rounds  Patient had transarterial aVR earlier today Awake and alert comfortable No groin tenderness or hematoma Normal sinus rhythm No cardiac murmur Doing well

## 2016-07-19 NOTE — Interval H&P Note (Signed)
History and Physical Interval Note:  07/19/2016 8:02 AM  [name] [name]  has presented today for surgery, with the diagnosis of SEVERE AORTIC STENOSIS  The various methods of treatment have been discussed with the patient and family. After consideration of risks, benefits and other options for treatment, the patient has consented to  Procedure(s): TRANSCATHETER AORTIC VALVE REPLACEMENT, TRANSFEMORAL (N/A) TRANSESOPHAGEAL ECHOCARDIOGRAM (TEE) (N/A) as a surgical intervention .  The patient's history has been reviewed, patient examined, no change in status, stable for surgery.  I have reviewed the patient's chart and labs.  Questions were answered to the patient's satisfaction.     Sherren Mocha

## 2016-07-19 NOTE — Anesthesia Preprocedure Evaluation (Addendum)
Anesthesia Evaluation  Patient identified by MRN, date of birth, ID band Patient awake    Reviewed: Allergy & Precautions, NPO status , Patient's Chart, lab work & pertinent test results, reviewed documented beta blocker date and time   History of Anesthesia Complications Negative for: history of anesthetic complications  Airway Mallampati: II  TM Distance: >3 FB Neck ROM: Full    Dental  (+) Edentulous Upper, Edentulous Lower, Dental Advisory Given   Pulmonary shortness of breath, asthma , former smoker,    breath sounds clear to auscultation       Cardiovascular hypertension, + CAD, + Peripheral Vascular Disease and +CHF   Rhythm:Regular + Systolic murmurs    Neuro/Psych    GI/Hepatic GERD  ,  Endo/Other  diabetes  Renal/GU Renal disease     Musculoskeletal  (+) Arthritis ,   Abdominal   Peds  Hematology  (+) anemia ,   Anesthesia Other Findings   Reproductive/Obstetrics                            Anesthesia Physical Anesthesia Plan  ASA: IV  Anesthesia Plan: General   Post-op Pain Management:    Induction: Intravenous  Airway Management Planned: Oral ETT  Additional Equipment: Arterial line, CVP, PA Cath, Ultrasound Guidance Line Placement and TEE  Intra-op Plan:   Post-operative Plan: Possible Post-op intubation/ventilation  Informed Consent:   Dental advisory given  Plan Discussed with: CRNA, Anesthesiologist and Surgeon  Anesthesia Plan Comments:         Anesthesia Quick Evaluation

## 2016-07-19 NOTE — Progress Notes (Addendum)
Nutrition Follow-up  DOCUMENTATION CODES:   Not applicable  INTERVENTION:    RD to monitor PO intake, add supplements accordingly   NEW NUTRITION DIAGNOSIS:   Increased nutrient needs related to  (post-op healing) as evidenced by estimated needs, ongoing  GOAL:   Patient will meet greater than or equal to 90% of their needs, currently unmet  MONITOR:   PO intake, Labs, Weight trends, I & O's  ASSESSMENT:    [name] [name] is a 82 y.o. male with history of CAF, HTN, HL, DM2, systolic CHF w reduced EF 35-40%, carotid stenosis and aortic valve stenosis.  Presenting with marked DOE , can't walk 10-15 feet and unable to lie flat last night to sleep.  Seen in ED by cardiologist and noted that wt's are down 15 lbs since last visit but still severe DOE  Patient s/p procedure 9/5: TRANSCATHETER AORTIC VALVE REPLACEMENT   Pt transferred to 2S-SICU post-op (previously on 2H). Prior to surgery, pt was receiving a Heart Healthy/Carbohydrate Modified diet. PO intake was good at 75-90% per flowsheet records. RD to monitor PO intake, add supplements as needed.  Diet Order:  Diet NPO time specified Diet full liquid Room service appropriate? Yes; Fluid consistency: Thin  Skin:  Reviewed, no issues  Last BM:  9/4  Height:   Ht Readings from Last 1 Encounters:  07/01/16 6\' 1"  (1.854 m)    Weight:   Wt Readings from Last 1 Encounters:  07/19/16 207 lb 10.8 oz (94.2 kg)    Ideal Body Weight:  83.63 kg  BMI:  Body mass index is 27.4 kg/m.  Estimated Nutritional Needs:   Kcal:  1800-2100 calories  Protein:  95-115 grams  Fluid:  >/= 1.8L  EDUCATION NEEDS:   No education needs identified at this time  Arthur Holms, RD, LDN Pager #: [phone] After-Hours Pager #: [phone]

## 2016-07-19 NOTE — Anesthesia Procedure Notes (Signed)
Procedure Name: Intubation Date/Time: 07/19/2016 8:23 AM Performed by: Melina Copa, Travell Desaulniers R Pre-anesthesia Checklist: Patient identified, Emergency Drugs available, Suction available and Patient being monitored Patient Re-evaluated:Patient Re-evaluated prior to inductionOxygen Delivery Method: Circle System Utilized Preoxygenation: Pre-oxygenation with 100% oxygen Intubation Type: IV induction Ventilation: Mask ventilation without difficulty Laryngoscope Size: Mac and 4 Grade View: Grade II Tube type: Oral Tube size: 8.0 mm Number of attempts: 1 Airway Equipment and Method: Stylet Placement Confirmation: ETT inserted through vocal cords under direct vision,  positive ETCO2 and breath sounds checked- equal and bilateral Secured at: 21 cm Tube secured with: Tape Dental Injury: Teeth and Oropharynx as per pre-operative assessment

## 2016-07-19 NOTE — Anesthesia Procedure Notes (Signed)
Central Venous Catheter Insertion Performed by: anesthesiologist Patient location: Pre-op. Preanesthetic checklist: patient identified, IV checked, site marked, risks and benefits discussed, surgical consent, monitors and equipment checked, pre-op evaluation, timeout performed and anesthesia consent Lidocaine 1% used for infiltration Landmarks identified Catheter size: 8.5 Fr Sheath introducer Procedure performed using ultrasound guided technique. Attempts: 1 Following insertion, line sutured and dressing applied. Post procedure assessment: blood return through all ports, free fluid flow and no air. Patient tolerated the procedure well with no immediate complications.       

## 2016-07-19 NOTE — Anesthesia Preprocedure Evaluation (Signed)
Anesthesia Evaluation  Patient identified by MRN, date of birth, ID band Patient awake    Reviewed: Allergy & Precautions, NPO status , Patient's Chart, lab work & pertinent test results  History of Anesthesia Complications Negative for: history of anesthetic complications  Airway Mallampati: I  TM Distance: >3 FB Neck ROM: Full    Dental  (+) Edentulous Upper, Edentulous Lower   Pulmonary shortness of breath, asthma , former smoker,    breath sounds clear to auscultation       Cardiovascular hypertension, Pt. on medications and Pt. on home beta blockers + CAD, + Peripheral Vascular Disease and +CHF  + Valvular Problems/Murmurs AS  Rhythm:Regular     Neuro/Psych negative neurological ROS  negative psych ROS   GI/Hepatic Neg liver ROS, GERD  Controlled,  Endo/Other  diabetes, Type 2  Renal/GU Renal InsufficiencyRenal disease     Musculoskeletal  (+) Arthritis ,   Abdominal   Peds  Hematology  (+) anemia ,   Anesthesia Other Findings   Reproductive/Obstetrics                             Anesthesia Physical Anesthesia Plan  ASA: IV  Anesthesia Plan: General   Post-op Pain Management:    Induction: Intravenous  Airway Management Planned: Oral ETT  Additional Equipment: TEE, Arterial line, CVP, Ultrasound Guidance Line Placement and PA Cath  Intra-op Plan:   Post-operative Plan: Extubation in OR and Possible Post-op intubation/ventilation  Informed Consent: I have reviewed the patients History and Physical, chart, labs and discussed the procedure including the risks, benefits and alternatives for the proposed anesthesia with the patient or authorized representative who has indicated his/her understanding and acceptance.   Dental advisory given  Plan Discussed with: CRNA and Surgeon  Anesthesia Plan Comments:         Anesthesia Quick Evaluation

## 2016-07-19 NOTE — Op Note (Signed)
[facility]   MULTIDISCIPLINARY HEART VALVE TEAM   TAVR OPERATIVE NOTE   Date of Procedure:  07/19/2016  Preoperative Diagnosis: Severe Aortic Stenosis   Postoperative Diagnosis: Same   Procedure:    Transcatheter Aortic Valve Replacement - Percutaneous Left Transfemoral Approach  Edwards Sapien 3 THV (size 29 mm, model # 9600TFX, serial # [id number])   Co-Surgeons:  Valentina Gu. Roxy Manns, MD and Sherren Mocha, MD  Assistants:   Gaye Pollack, MD  Anesthesiologist:  Laurie Panda, MD  Echocardiographer:  Ena Dawley, MD  Pre-operative Echo Findings:  Severe low gradient low EF aortic stenosis  Moderate-severe left ventricular systolic dysfunction  Severe mitral regurgitation  Post-operative Echo Findings:  Trivial paravalvular leak  Unchanged left ventricular systolic dysfunction  Unchanged mitral regurgitation  BRIEF CLINICAL NOTE AND INDICATIONS FOR SURGERY  Patient is an 82 year old male with long-standing history of aortic stenosis, chronic atrial fibrillation, hypertension, type II diabetes mellitus w/ complications, hyperlipidemia, and cerebrovascular disease who has recently been diagnosed with severe low gradient low ejection fraction aortic stenosis, severe mitral regurgitation, and acute on chronic systolic congestive heart failure with cardiogenic shock who has been referred for surgical consultation to discuss treatment options. The patient has a long history of aortic stenosis and chronic atrial fibrillation on long-term anticoagulation using warfarin. In the past the patient had been followed by Dr. Lattie Haw.  He was last seen by Bunnie Domino and Dr. Izell Belspring office on 04/27/2012.  Echocardiogram performed 04/12/2012 revealed moderate aortic stenosis with mean transvalvular gradient estimated 18 mmHg, aortic valve area calculated 1.16 cm, and left ventricular ejection fraction estimated 50-55%. The patient states that over the past 4-5  years he has experienced slow gradual decline in exercise tolerance with progressive exertional shortness of breath and fatigue. He still remained reasonably active physically until January of this year. Over the past 6 months the patient has experienced precipitous decline in his exercise tolerance with severe exertional shortness of breath, intermittent dizzy spells with near syncopal episodes, intermittent resting shortness of breath, lower extremity edema, and a tendency to simply "give out" with any significant activity. He was seen in follow-up by his primary care physician and referred for cardiology evaluation approximately one month ago.  Follow-up echocardiogram performed 06/01/2016 revealed severe low gradient low ejection fraction aortic stenosis. Peak velocity across the aortic valve was reported 2.6 m/s corresponding to mean transvalvular gradient estimated 17 mmHg.  The dimensionless velocity ratio was measured 0.16.  Left ventricular ejection fraction was estimated 35-40% with diffuse global hypokinesis. There was moderate to severe mitral regurgitation, severe left atrial enlargement, mild to moderate tricuspid regurgitation, and findings suggestive of moderate pulmonary hypertension.  The patient underwent outpatient transesophageal echocardiogram and left and right heart catheterization on 06/17/2016. TEE revealed moderate to severe left ventricular systolic dysfunction with ejection fraction estimated 30-35%. There was severe aortic stenosis with mild aortic insufficiency. The dimensionless velocity ratio was measured 0.2 with peak velocity across the aortic valve measured 3.2 m/s corresponding to mean transvalvular gradient estimated 22 mmHg. There was moderate to severe mitral regurgitation with the jet of regurgitation directed eccentrically and posteriorly.  PISA was felt to be possibly inaccurate but ERO measured 0.2 cm corresponding to regurgitant volume estimated 38 mL.  Diagnostic  cardiac catheterization revealed moderate coronary artery disease with 75% stenosis of the mid left anterior descending coronary artery, 80% stenosis of the distal left circumflex coronary artery, and otherwise moderate nonobstructive disease. Attempts to cross the aortic valve were unsuccessful at  catheterization.  Right heart catheterization revealed moderate pulmonary hypertension with PA pressures measured 52/23, mean pulmonary A wedge pressure measured 25 mmHg, central venous pressure 11 mmHg, and low cardiac output (3.2 L/m) and mixed venous oxygen saturation (52%).  The patient was seen in follow-up by Dr. Harl Bowie and his dose of beta blocker was decreased and dose of Lasix increased. Outpatient carotid duplex scan revealed severe right internal carotid artery stenosis.  He was referred for surgical consultation but prior to his office visit the patient presented acutely to the emergency department 06/28/2016 with further progression of symptoms of shortness of breath and orthopnea. He was admitted to [facility] under the care and direction of Dr. Haroldine Laws and the advanced heart failure team. Cardiothoracic surgical consultation was requested.  During the course of the patient's preoperative work up they have been evaluated comprehensively by a multidisciplinary team of specialists coordinated through the [facility] in the [facility].  They have been demonstrated to suffer from symptomatic severe aortic stenosis as noted above. He underwent carotid stenting for severe symptomatic right internal carotid artery stenosis.  His heart failure has required continuous inotropic support using milrinone.  The patient has been counseled extensively as to the relative risks and benefits of all options for the treatment of severe aortic stenosis including long term medical therapy, conventional surgery for aortic valve replacement, and transcatheter  aortic valve replacement.  The patient has been independently evaluated by two cardiac surgeons including myself and Dr. Cyndia Bent, and he is felt to be a poor candidate for conventional surgery (predicted risk of mortality >15% and/or predicted risk of permanent morbidity >50%).   Based upon review of all of the patient's preoperative diagnostic tests they are felt to be candidate for transcatheter aortic valve replacement using the transfemoral approach.    Following the decision to proceed with transcatheter aortic valve replacement, a discussion has been held regarding what types of management strategies would be attempted intraoperatively in the event of life-threatening complications, including whether or not the patient would be considered a candidate for the use of cardiopulmonary bypass and/or conversion to open sternotomy for attempted surgical intervention.  The patient has been advised of a variety of complications that might develop peculiar to this approach including but not limited to risks of death, stroke, paravalvular leak, aortic dissection or other major vascular complications, aortic annulus rupture, device embolization, cardiac rupture or perforation, acute myocardial infarction, arrhythmia, heart block or bradycardia requiring permanent pacemaker placement, congestive heart failure, respiratory failure, renal failure, pneumonia, infection, other late complications related to structural valve deterioration or migration, or other complications that might ultimately cause a temporary or permanent loss of functional independence or other long term morbidity.  The patient provides full informed consent for the procedure as described and all questions were answered preoperatively.    DETAILS OF THE OPERATIVE PROCEDURE  PREPARATION:    The patient is brought to the operating room on the above mentioned date and central monitoring was established by the anesthesia team including placement of  Swan-Ganz catheter and radial arterial line. The patient is placed in the supine position on the operating table.  Intravenous antibiotics are administered.  General endotracheal anesthesia is induced uneventfully.  A Foley catheter is placed.  Baseline transesophageal echocardiogram was performed. The patient's chest, abdomen, both groins, and both lower extremities are prepared and draped in a sterile manner. A time out procedure is performed.   PERIPHERAL ACCESS:  Using the modified Seldinger technique, femoral arterial and venous access was obtained with placement of 6 Fr sheaths on the right side.  A pigtail diagnostic catheter was passed through the right arterial sheath under fluoroscopic guidance into the aortic root.  A temporary transvenous pacemaker catheter was passed through the right femoral venous sheath under fluoroscopic guidance into the right ventricle.  The pacemaker was tested to ensure stable lead placement and pacemaker capture. Aortic root angiography was performed in order to determine the optimal angiographic angle for valve deployment.   TRANSFEMORAL ACCESS:   Percutaneous transfemoral access and sheath placement was performed by Dr Burt Knack. Please see his separate operative note for details. The patient was heparinized systemically and ACT verified > 250 seconds.    A 16 Fr transfemoral E-sheath was introduced into the left common femoral artery after progressively dilating over an Amplatz superstiff wire. An AL-2 catheter was used to direct a straight-tip exchange length wire across the native aortic valve into the left ventricle. This was exchanged out for a pigtail catheter and position was confirmed in the LV apex. Simultaneous LV and Ao pressures were recorded.  The pigtail catheter was exchanged for an Amplatz Extra-stiff wire in the LV apex.  Echocardiography was utilized to confirm appropriate wire position and no sign of entanglement in the mitral subvalvular  apparatus.   TRANSCATHETER HEART VALVE DEPLOYMENT:   An Edwards Sapien 3 transcatheter heart valve (size 29 mm, model #9600TFX, serial [id number]) was prepared and crimped per manufacturer's guidelines, and the proper orientation of the valve is confirmed on the [employer] delivery system. The valve was advanced through the introducer sheath using normal technique until in an appropriate position in the abdominal aorta beyond the sheath tip. The balloon was then retracted and using the fine-tuning wheel was centered on the valve. The valve was then advanced across the aortic arch using appropriate flexion of the catheter. The valve was carefully positioned across the aortic valve annulus. The Commander catheter was retracted using normal technique. Once final position of the valve has been confirmed by angiographic assessment, the valve is deployed while temporarily holding ventilation and during rapid ventricular pacing to maintain systolic blood pressure < 50 mmHg and pulse pressure < 10 mmHg. The balloon inflation is held for >3 seconds after reaching full deployment volume. Once the balloon has fully deflated the balloon is retracted into the ascending aorta and valve function is assessed using echocardiography. There is felt to be trivial paravalvular leak and no central aortic insufficiency.  The patient's hemodynamic recovery following valve deployment is good.  The deployment balloon and guidewire are both removed. Echo demostrated acceptable post-procedural gradients, stable mitral valve function, and trivial aortic insufficiency.   PROCEDURE COMPLETION:   The sheath was removed and femoral artery closure performed by Dr Burt Knack. Please see his separate report for details.  Protamine was administered once femoral arterial repair was complete. The temporary pacemaker, pigtail catheters and femoral sheaths were removed with manual pressure used for hemostasis.   The patient tolerated the  procedure well and is transported to the surgical intensive care in stable condition. There were no immediate intraoperative complications. All sponge instrument and needle counts are verified correct at completion of the operation.   No blood products were administered during the operation.  The patient received a total of 45.1 mL of intravenous contrast during the procedure.   Rexene Alberts, MD 07/19/2016 9:55 AM

## 2016-07-19 NOTE — H&P (View-Only) (Signed)
TAVR TEAM NOTE Subjective:  No CP or dyspnea this am. Chills have resolved.   Objective:  Vital Signs in the last 24 hours: Temp:  [97.5 F (36.4 C)-98.3 F (36.8 C)] 98.3 F (36.8 C) (09/04 0718) Pulse Rate:  [57-85] 68 (09/04 0800) Resp:  [13-28] 28 (09/04 0800) BP: (79-130)/(31-108) 79/52 (09/04 0800) SpO2:  [95 %-100 %] 96 % (09/04 0800) Weight:  [94.6 kg (208 lb 8 oz)] 94.6 kg (208 lb 8 oz) (09/04 0500)  Intake/Output from previous day: 09/03 0701 - 09/04 0700 In: 1827.8 [P.O.:510; I.V.:1017.8; IV Piggyback:300] Out: 2325 [Urine:2325]  Physical Exam: Pt is alert and oriented, elderly male in NAD HEENT: normal Neck: JVP - normal Lungs: CTA bilaterally CV: irregular with 2/6 systolic murmur at the RUSB Abd: soft, NT, Positive BS, no hepatomegaly Ext: no C/C/E, right groin ecchymoses. Bandage removed, no hematoma, right femoral pulse 2+ Skin: warm/dry no rash   Lab Results:  Recent Labs  07/17/16 0400 07/18/16 0400  WBC 7.1 6.7  HGB 10.3* 10.3*  PLT 151 138*    Recent Labs  07/17/16 0400 07/18/16 0400  NA 133* 131*  K 3.5 3.3*  CL 102 100*  CO2 25 25  GLUCOSE 145* 256*  BUN 14 13  CREATININE 1.29* 1.28*   No results for input(s): TROPONINI in the last 72 hours.  Invalid input(s): CK, MB  Cardiac Studies: Echo: Study Conclusions  - Left ventricle: The cavity size was normal. Wall thickness was   increased in a pattern of mild LVH. Systolic function was   moderately reduced. The estimated ejection fraction was in the   range of 35% to 40%. - Aortic valve: AV is thickened, calcified with moderately   restricted motion Peak and mean gradients through the valve are   38 and 21 mm Hg respectively This is mildly increased from echo   of June 2017. There was mild regurgitation. - Mitral valve: There was mild to moderate regurgitation. - Left atrium: The atrium was severely dilated. - Right ventricle: Systolic function was mildly reduced. -  Right atrium: The atrium was mildly dilated. - Pulmonary arteries: PA peak pressure: 41 mm Hg (S).  Impressions:  - Poor acoustic windows lmit study.  Cath: Procedures   Right Heart Cath and Coronary Angiography  Conclusion     Ost Cx to Prox Cx lesion, 50 %stenosed.  Mid Cx to Dist Cx lesion, 80 %stenosed.  Mid RCA lesion, 40 %stenosed.  Mid LAD lesion, 75 %stenosed.  2nd Diag lesion, 75 %stenosed.  Hemodynamic findings consistent with moderate pulmonary hypertension.  Unable to cross the aortic valve.  PA sat 52%. CO 3.2 L/min.   Continue with plans for CT surgery evaluation, for valve disease and two vessel CAD.    Tele: Atrial fibrillation, rate controlled  Assessment/Plan:  1. Cardiogenic Shock- TEE with EF 30-35% 2. Acute Respiratory Failure - resolved 3. A fib RVR - rate controlled on IV amiodarone 4. CAD -  2 vessel disease - plan medical Rx 5. Valvular Heart Disease-  Severe AS/moderate-severe MR (MR may be functional).  6. Caroltid Stenosis- Severe carotid stenosis RICA >80% stenosis by carotid dopplers, CTA head/neck with critical >90% RICA stenosis an [id number] LICA. Now s/p Carotid stent without periprocedural complication    --s/p R carotid stent 8/28 7. Bilateral Effusions  8. Nasopharyngeal mass 9. Rash- possible clopidogrel allergy 10. Hypokalemia - KDur written 11. Hematuria - no recurrence tolerating IV heparin 12. UTI 13. VT - stable on amiodarone  14. Chills - no fever or hypothermia overnight. Symptoms resolved this am  Overall looks stable. Continues on IV amiodarone, IV milrinone, and heparin infusion. Co-ox 65 this am. Vanc started yesterday for chills and concern for infection - Cx's drawn. KDur 20 meq BID today written. Discussed TAVR surgery at length with patient and niece again. They understand he is not a candidate for sternotomy even if complications arise. Post-TAVR anticipate continuation of ASA and will start oral anticoagulant for  atrial fibrillation - will discuss enrollment in TAVI-AF Trial if he qualifies - will need to assess whether recent carotid stent is an exclusion.   Sherren Mocha, M.D. 07/18/2016, 9:37 AM

## 2016-07-20 ENCOUNTER — Encounter (HOSPITAL_COMMUNITY): Payer: Self-pay | Admitting: Cardiovascular Disease

## 2016-07-20 ENCOUNTER — Other Ambulatory Visit: Payer: Self-pay

## 2016-07-20 ENCOUNTER — Inpatient Hospital Stay (HOSPITAL_COMMUNITY): Payer: Medicare Other

## 2016-07-20 DIAGNOSIS — Z954 Presence of other heart-valve replacement: Secondary | ICD-10-CM

## 2016-07-20 DIAGNOSIS — I350 Nonrheumatic aortic (valve) stenosis: Secondary | ICD-10-CM

## 2016-07-20 LAB — GLUCOSE, CAPILLARY
GLUCOSE-CAPILLARY: 148 mg/dL — AB (ref 65–99)
GLUCOSE-CAPILLARY: 151 mg/dL — AB (ref 65–99)
Glucose-Capillary: 141 mg/dL — ABNORMAL HIGH (ref 65–99)
Glucose-Capillary: 175 mg/dL — ABNORMAL HIGH (ref 65–99)
Glucose-Capillary: 83 mg/dL (ref 65–99)

## 2016-07-20 LAB — MAGNESIUM
MAGNESIUM: 2.1 mg/dL (ref 1.7–2.4)
Magnesium: 1.9 mg/dL (ref 1.7–2.4)

## 2016-07-20 LAB — CBC
HCT: 30.3 % — ABNORMAL LOW (ref 39.0–52.0)
HEMOGLOBIN: 10.1 g/dL — AB (ref 13.0–17.0)
MCH: 29.7 pg (ref 26.0–34.0)
MCHC: 33.3 g/dL (ref 30.0–36.0)
MCV: 89.1 fL (ref 78.0–100.0)
PLATELETS: 146 10*3/uL — AB (ref 150–400)
RBC: 3.4 MIL/uL — AB (ref 4.22–5.81)
RDW: 14.4 % (ref 11.5–15.5)
WBC: 8.8 10*3/uL (ref 4.0–10.5)

## 2016-07-20 LAB — CARBOXYHEMOGLOBIN
Carboxyhemoglobin: 1.6 % — ABNORMAL HIGH (ref 0.5–1.5)
Methemoglobin: 0.7 % (ref 0.0–1.5)
O2 SAT: 65.4 %
Total hemoglobin: 10.4 g/dL — ABNORMAL LOW (ref 12.0–16.0)

## 2016-07-20 LAB — BASIC METABOLIC PANEL
ANION GAP: 11 (ref 5–15)
BUN: 8 mg/dL (ref 6–20)
CHLORIDE: 98 mmol/L — AB (ref 101–111)
CO2: 24 mmol/L (ref 22–32)
Calcium: 7.8 mg/dL — ABNORMAL LOW (ref 8.9–10.3)
Creatinine, Ser: 1.06 mg/dL (ref 0.61–1.24)
GFR calc Af Amer: >60 mL/min (ref >60)
GLUCOSE: 132 mg/dL — AB (ref 65–99)
POTASSIUM: 3.9 mmol/L (ref 3.5–5.1)
SODIUM: 133 mmol/L — AB (ref 135–145)

## 2016-07-20 LAB — CREATININE, SERUM
CREATININE: 1.2 mg/dL (ref 0.61–1.24)
GFR, EST NON AFRICAN AMERICAN: 54 mL/min — AB (ref >60)

## 2016-07-20 MED ORDER — SORBITOL 70 % SOLN
30.0000 mL | Freq: Once | Status: AC
  Administered 2016-07-20: 30 mL via ORAL
  Filled 2016-07-20 (×2): qty 30

## 2016-07-20 MED ORDER — DOCUSATE SODIUM 100 MG PO CAPS
200.0000 mg | ORAL_CAPSULE | Freq: Two times a day (BID) | ORAL | Status: DC
  Administered 2016-07-20 – 2016-07-22 (×4): 200 mg via ORAL
  Filled 2016-07-20 (×4): qty 2

## 2016-07-20 MED ORDER — BISACODYL 10 MG RE SUPP
10.0000 mg | Freq: Every day | RECTAL | Status: DC | PRN
  Administered 2016-07-20: 10 mg via RECTAL
  Filled 2016-07-20: qty 1

## 2016-07-20 MED ORDER — MILRINONE LACTATE IN DEXTROSE 20-5 MG/100ML-% IV SOLN
0.1250 ug/kg/min | INTRAVENOUS | Status: DC
  Administered 2016-07-20: 0.25 ug/kg/min via INTRAVENOUS
  Administered 2016-07-21: 0.125 ug/kg/min via INTRAVENOUS
  Filled 2016-07-20 (×3): qty 100

## 2016-07-20 MED ORDER — POLYETHYLENE GLYCOL 3350 17 G PO PACK
17.0000 g | PACK | Freq: Every day | ORAL | Status: DC
  Administered 2016-07-20: 17 g via ORAL

## 2016-07-20 MED ORDER — NORTRIPTYLINE HCL 10 MG PO CAPS
10.0000 mg | ORAL_CAPSULE | Freq: Every evening | ORAL | Status: DC | PRN
Start: 2016-07-20 — End: 2016-07-23
  Filled 2016-07-20: qty 1

## 2016-07-20 MED ORDER — INSULIN ASPART 100 UNIT/ML ~~LOC~~ SOLN
0.0000 [IU] | Freq: Three times a day (TID) | SUBCUTANEOUS | Status: DC
  Administered 2016-07-20: 2 [IU] via SUBCUTANEOUS
  Administered 2016-07-20 – 2016-07-21 (×3): 3 [IU] via SUBCUTANEOUS
  Administered 2016-07-21 – 2016-07-22 (×5): 2 [IU] via SUBCUTANEOUS

## 2016-07-20 MED ORDER — POLYETHYLENE GLYCOL 3350 17 G PO PACK
17.0000 g | PACK | Freq: Every day | ORAL | Status: DC
  Filled 2016-07-20: qty 1

## 2016-07-20 MED ORDER — TRAZODONE HCL 100 MG PO TABS
100.0000 mg | ORAL_TABLET | Freq: Every day | ORAL | Status: DC
  Administered 2016-07-20 – 2016-07-22 (×3): 100 mg via ORAL
  Filled 2016-07-20: qty 1
  Filled 2016-07-20: qty 2
  Filled 2016-07-20: qty 1

## 2016-07-20 MED ORDER — DOCUSATE SODIUM 100 MG PO CAPS: 200.0000 mg | ORAL_CAPSULE | Freq: Two times a day (BID) | ORAL | Status: DC

## 2016-07-20 MED ORDER — SPIRONOLACTONE 25 MG PO TABS
12.5000 mg | ORAL_TABLET | Freq: Every day | ORAL | Status: DC
  Administered 2016-07-20 – 2016-07-23 (×4): 12.5 mg via ORAL
  Filled 2016-07-20 (×4): qty 1

## 2016-07-20 MED ORDER — AMIODARONE HCL 200 MG PO TABS
200.0000 mg | ORAL_TABLET | Freq: Every day | ORAL | Status: DC
  Administered 2016-07-21 – 2016-07-22 (×2): 200 mg via ORAL
  Filled 2016-07-20 (×2): qty 1

## 2016-07-20 NOTE — Progress Notes (Addendum)
[address]       [place] [address]             [phone]        CARDIOTHORACIC SURGERY PROGRESS NOTE   R1 Day Post-Op Procedure(s) (LRB): TRANSCATHETER AORTIC VALVE REPLACEMENT, TRANSFEMORAL (N/A) TRANSESOPHAGEAL ECHOCARDIOGRAM (TEE) (N/A)  Subjective: Looks good and feels well.  No pain.  No SOB.  Constipated.  Objective: Vital signs: BP Readings from Last 1 Encounters:  07/20/16 125/70   Pulse Readings from Last 1 Encounters:  07/20/16 75   Resp Readings from Last 1 Encounters:  07/20/16 18   Temp Readings from Last 1 Encounters:  07/20/16 99.1 F (37.3 C)    Hemodynamics: PAP: (30-52)/(10-27) 47/27 CO:  [3.7 L/min-6.3 L/min] 6.3 L/min CI:  [1.7 L/min/m2-3.1 L/min/m2] 3.1 L/min/m2  Mixed venous co-ox 65.4%  Physical Exam:  Rhythm:   Afib w/ HR 70's  Breath sounds: clear  Heart sounds:  Irregular w/ soft holosystolic murmur  Incisions:  Both groins look good, no hematoma  Abdomen:  Soft, non-distended, non-tender  Extremities:  Warm, well-perfused    Intake/Output from previous day: 09/05 0701 - 09/06 0700 In: 4911.8 [P.O.:1040; I.V.:3321.8; IV Piggyback:550] Out: [id number] Intake/Output this shift: No intake/output data recorded.  Lab Results:  CBC: Recent Labs  07/19/16 1600 07/19/16 1624 07/20/16 0355  WBC 6.3  --  8.8  HGB 11.0* 11.2* 10.1*  HCT 33.4* 33.0* 30.3*  PLT 132*  --  146*    BMET:  Recent Labs  07/19/16 0433  07/19/16 1624 07/20/16 0355  NA 136  < > 138 133*  K 4.0  < > 3.7 3.9  CL 104  < > 99* 98*  CO2 28  --   --  24  GLUCOSE 158*  < > 137* 132*  BUN 8  < > 7 8  CREATININE 1.14  < > 1.00 1.06  CALCIUM 8.6*  --   --  7.8*  < > = values in this interval not displayed.   PT/INR:   Recent Labs  07/19/16 1044  LABPROT 15.2  INR 1.19    CBG (last 3)   Recent Labs  07/19/16 1924 07/20/16 0002 07/20/16 0345  GLUCAP 170* 111* 141*    ABG    Component Value Date/Time   PHART 7.394 07/19/2016 1054   PCO2ART 37.7 07/19/2016 1054   PO2ART 74.0 (L) 07/19/2016 1054   HCO3 23.4 07/19/2016 1054   TCO2 28 07/19/2016 1624   ACIDBASEDEF 2.0 07/19/2016 1054   O2SAT 65.4 07/20/2016 0420    CXR: PORTABLE CHEST 1 VIEW  COMPARISON:  July 19, 2016  FINDINGS: Swan-Ganz catheter tip is in the proximal right intralobar pulmonary artery. Right subclavian catheter tip is at the cavoatrial junction. No pneumothorax. There has been significant partial clearing of interstitial edema. Only a slight amount of interstitial edema remains. There is no airspace consolidation. Heart is borderline enlarged with mild pulmonary venous hypertension. Prosthetic valve unchanged in position. There is aortic atherosclerosis.  IMPRESSION: Significant clearing of interstitial edema with only mild interstitial edema remaining. No new opacity. There remains a degree of pulmonary venous hypertension. Catheter positions are unchanged without pneumothorax.   Electronically Signed   By: Lowella Grip III M.D.   On: 07/20/2016 07:40   EKG: Afib w/out acute ischemic changes     Assessment/Plan: S/P Procedure(s) (LRB): TRANSCATHETER AORTIC VALVE REPLACEMENT, TRANSFEMORAL (N/A) TRANSESOPHAGEAL ECHOCARDIOGRAM (TEE) (N/A)  Doing very well POD1 TAVR Maintaining  stable hemodynamics in rate-controlled Afib, PA pressures relatively low - vasodilated on milrinone drip w/ low dose Neo for BP support Breathing comfortably w/ O2 sats 90-95% on room air, CXR looks good Chronic combined systolic and diastolic CHF with expected post-op volume excess Mitral regurgitation Chronic persistent atrial fibrillation w/ episodes WCT last week on IV amiodarone Cerebrovascular disease s/p RICA stent 8/28 Type II diabetes mellitus, excellent glycemic control   Mobilize  D/C Swan-ganz if okay w/ Dr Haroldine Laws  I favor stopping IV amiodarone and slowly weaning milrinone  Gentle  diuresis  Advance diet and change CBG's and SSI to ac/hs  ASA + warfarin versus ASA + DOAC - to begin tomorrow - will discuss w/ team  Routine f/u ECHO today   I spent in excess of 15 minutes during the conduct of this hospital encounter and >50% of this time involved direct face-to-face encounter with the patient for counseling and/or coordination of their care.  Rexene Alberts, MD 07/20/2016 8:01 AM

## 2016-07-20 NOTE — Progress Notes (Signed)
      [address]       [place] [address]             [phone]      POD # 1 TAVR  Up in chair  BP (!) 104/59   Pulse 82   Temp 97.8 F (36.6 C) (Oral)   Resp (!) 26   Ht 6\' 1"  (1.854 m)   Wt 211 lb 13.8 oz (96.1 kg)   SpO2 100%   BMI 27.95 kg/m  CVP= 9  Intake/Output Summary (Last 24 hours) at 07/20/16 1720 Last data filed at 07/20/16 1400  Gross per 24 hour  Intake          2549.13 ml  Output             1260 ml  Net          1289.13 ml   Milrinone @ 0.25, amiodarone @ 30  Doing well POD # 1  Marilyne Haseley C. Roxan Hockey, MD [facility] [phone]

## 2016-07-20 NOTE — Progress Notes (Addendum)
Patient ID: [name] [name], male   DOB: October 16, 1932, 82 y.o.   MRN: [id number]   Advanced Heart Failure Rounding Note   Subjective:    Developed frank hematuria 07/10/16. Heparin stopped. UA and urine cytology sent. UA + for blood and WBCs.   S/p R Carotid stent 07/11/16  Urology saw 07/12/16 for hematuria/urinary retention. Thought to be likely due to infection. Cipro x 7 days + flomax. Had CBI catheter which is now out   Had VT on 9/2. Started IV amio and electrolytes supped. Milrinone turned down to 0.125.  VT improved.  Was bradycardic and digoxin stopped (level 1.4) on 9/2.   S/P AVR 9/5. On amio drip 30 mg per hour + milrinone 0.375 mcg. Earlier this morning started on neo due to low MAP. Neo up to 20 mcg but now off.   Denies pain. Complaining of constipation.  CVP 7-8 CO 6 CI 3.1    Objective:   Weight Range:  Vital Signs:   Temp:  [95.4 F (35.2 C)-100.6 F (38.1 C)] 99.3 F (37.4 C) (09/06 0600) Pulse Rate:  [45-128] 69 (09/06 0600) Resp:  [12-30] 19 (09/06 0600) BP: (95-144)/(45-91) 122/51 (09/06 0600) SpO2:  [90 %-100 %] 90 % (09/06 0600) Arterial Line BP: (91-169)/(40-61) 113/42 (09/06 0600) Weight:  [211 lb 13.8 oz (96.1 kg)] 211 lb 13.8 oz (96.1 kg) (09/06 0600) Last BM Date: 07/19/16  Weight change: Filed Weights   07/18/16 0500 07/19/16 0400 07/20/16 0600  Weight: 208 lb 8 oz (94.6 kg) 207 lb 10.8 oz (94.2 kg) 211 lb 13.8 oz (96.1 kg)    Intake/Output:   Intake/Output Summary (Last 24 hours) at 07/20/16 0723 Last data filed at 07/20/16 0500  Gross per 24 hour  Intake          4816.04 ml  Output             4275 ml  Net           541.04 ml     Physical Exam: CVP 7-8 General:  Lying in bed.  HEENT: Normal Neck: supple. JVP 7-8 Carotids 2+ bilat; no bruits. No thyromegaly or nodule noted. Swan RIJ Cor: PMI nondisplaced. Irregular No S3.  S2 crisp   Lungs: CTAB, normal effort on 3 liters [place] Abdomen: soft, NT, ND, no HSM. No bruits or masses. +BS    Extremities: no cyanosis, clubbing, rash, no edema. R and LLE SCDs Neuro: alert and oriented x3.   Telemetry: A fib 50s   Labs: Basic Metabolic Panel:  Recent Labs Lab 07/16/16 0352 07/17/16 0400 07/18/16 0400 07/19/16 0433 07/19/16 0837 07/19/16 0928 07/19/16 0959 07/19/16 1055 07/19/16 1600 07/19/16 1624 07/20/16 0355  NA 133* 133* 131* 136 137 135 135 138  --  138 133*  K 4.2 3.5 3.3* 4.0 3.9 4.2 4.5 4.0  --  3.7 3.9  CL 101 102 100* 104 101 100* 100*  --   --  99* 98*  CO2 23 25 25 28   --   --   --   --   --   --  24  GLUCOSE 223* 145* 256* 158* 159* 179* 181* 181*  --  137* 132*  BUN 12 14 13 8 8 8 8   --   --  7 8  CREATININE 1.38* 1.29* 1.28* 1.14 0.90 0.90 1.10  --  1.12 1.00 1.06  CALCIUM 8.5* 8.4* 8.2* 8.6*  --   --   --   --   --   --  7.8*  MG 2.2 1.9 1.8 1.8  --   --   --   --  2.7*  --  2.1    Liver Function Tests: No results for input(s): AST, ALT, ALKPHOS, BILITOT, PROT, ALBUMIN in the last 168 hours. No results for input(s): LIPASE, AMYLASE in the last 168 hours. No results for input(s): AMMONIA in the last 168 hours.  CBC:  Recent Labs Lab 07/18/16 0400 07/19/16 0433  07/19/16 1044 07/19/16 1055 07/19/16 1600 07/19/16 1624 07/20/16 0355  WBC 6.7 6.2  --  6.2  --  6.3  --  8.8  HGB 10.3* 10.3*  < > 10.2* 9.9* 11.0* 11.2* 10.1*  HCT 31.8* 30.6*  < > 30.5* 29.0* 33.4* 33.0* 30.3*  MCV 90.6 89.0  --  89.2  --  89.3  --  89.1  PLT 138* 139*  --  122*  --  132*  --  146*  < > = values in this interval not displayed.  Cardiac Enzymes: No results for input(s): CKTOTAL, CKMB, CKMBINDEX, TROPONINI in the last 168 hours.  BNP: BNP (last 3 results)  Recent Labs  05/03/16 1305 05/28/16 1617 06/28/16 1316  BNP 232.0* 294.0* 222.0*    ProBNP (last 3 results) No results for input(s): PROBNP in the last 8760 hours.    Other results:  Imaging: Dg Chest Port 1 View  Result Date: 07/19/2016 CLINICAL DATA:  Status post aortic valve  replacement.  CHF. EXAM: PORTABLE CHEST 1 VIEW COMPARISON:  Portable chest x-ray of June 30, 2016 FINDINGS: The lungs are mildly hypoinflated. The pulmonary vascularity is engorged and the interstitial markings are increased diffusely. The cardiac silhouette is enlarged. The Swan-Ganz catheter tip projects over proximal main pulmonary artery on the right. The right-sided PICC line tip projects over the distal third of the SVC. There is no definite pleural effusion. The aortic valve cage is visible. IMPRESSION: Congestive heart failure with pulmonary interstitial edema. No alveolar pneumonia. The Swan-Ganz catheter tip may be wedged in a proximal main pulmonary artery branch on the right. Electronically Signed   By: David  Martinique M.D.   On: 07/19/2016 11:27     Medications:     Scheduled Medications: . aspirin EC  325 mg Oral Daily  . cefUROXime (ZINACEF)  IV  1.5 g Intravenous Q12H  . insulin aspart  0-24 Units Subcutaneous Q4H  . pantoprazole  40 mg Oral Daily  . sodium chloride flush  3 mL Intravenous Q12H    Infusions: . amiodarone 30 mg/hr (07/20/16 0500)  . milrinone 0.375 mcg/kg/min (07/20/16 0500)  . nitroGLYCERIN Stopped (07/19/16 1441)  . phenylephrine (NEO-SYNEPHRINE) Adult infusion 20 mcg/min (07/20/16 0500)    PRN Medications: sodium chloride, metoprolol, ondansetron (ZOFRAN) IV, sodium chloride flush   Assessment:   1. Cardiogenic Shock- TEE with EF 30-35% 2. Acute Respiratory Failure 3. A fib RVR 4. CAD -  2 vessel disease 5. Valvular Heart Disease-  Severe AS/moderate-severe MR (MR may be functional).  6. Caroltid Stenosis- Severe carotid stenosis RICA >80% stenosis by carotid dopplers, CTA head/neck with critical >90% RICA stenosis an [id number] LICA.     --s/p R carotid stent 8/28 7. Bilateral Effusions  8. Nasopharyngeal mass 9. Rash- 10. Hyperkalemia 11. Hematuria 12. UTI 13. VT 14. S/P AVR  Plan/Discussion:    S/P AVR.  Todays CO-OX 65%. Hemodynamics better  today. CO/CI improved. Cut back milrinone milrinone 0.25 mcg. Hopefully can remove swan today. Volume status stable. Add 12.5 mg spiro daily. Renal function  stable. Follow closely.   S/p R carotid stent 07/11/16. Continue ASA (allergic to plavix and too high risk for Brilinta.  VT quiescent on IV amio. Continue for now until we wean milrinone   Afib with good rate control - occasionally bradycardic.   CT 07/04/16 with bilateral kidney lesions. Felt to be simple cysts.     Bcx - NGTD. On empiric vancomycin for TAVR.  2 vessel CAD, on ASA 81 and low dose Crestor (prior myalgias with simvastatin).    Length of Stay: 22  Amy Clegg, NP-C  7:23 AM Advanced Heart Failure Team Pager [phone] (M-F; 7a - 4p)  Please contact [facility] for night-coverage after hours (4p -7a ) and weekends on [email or web address]  Patient seen and examined with Darrick Grinder, NP. We discussed all aspects of the encounter. I agree with the assessment and plan as stated above.   Hypotensive overnight and required neo in addition to milrinone. Neo now off. Luiz Blare numbers reviewed personally. Filling pressures down. Cardiac output up. Will pull swan and begin milrinone wean. Will keep him dry.   Stop IV amio. Switch to 200 po daily.. Surgical team enrolling him in NOAC trial. Consider DCCV after 4 weeks AC.   BCx remain negative. Can stop abx in am.   Ambulate today.   Sinclair Arrazola,MD 10:39 PM

## 2016-07-21 LAB — CARBOXYHEMOGLOBIN
CARBOXYHEMOGLOBIN: 1.2 % (ref 0.5–1.5)
Methemoglobin: 0.8 % (ref 0.0–1.5)
O2 SAT: 55 %
Total hemoglobin: 13.2 g/dL (ref 12.0–16.0)

## 2016-07-21 LAB — CBC
HCT: 31.1 % — ABNORMAL LOW (ref 39.0–52.0)
Hemoglobin: 10.1 g/dL — ABNORMAL LOW (ref 13.0–17.0)
MCH: 29.4 pg (ref 26.0–34.0)
MCHC: 32.5 g/dL (ref 30.0–36.0)
MCV: 90.7 fL (ref 78.0–100.0)
PLATELETS: 123 10*3/uL — AB (ref 150–400)
RBC: 3.43 MIL/uL — AB (ref 4.22–5.81)
RDW: 14.6 % (ref 11.5–15.5)
WBC: 5.8 10*3/uL (ref 4.0–10.5)

## 2016-07-21 LAB — BASIC METABOLIC PANEL
Anion gap: 6 (ref 5–15)
BUN: 7 mg/dL (ref 6–20)
CALCIUM: 8.4 mg/dL — AB (ref 8.9–10.3)
CO2: 27 mmol/L (ref 22–32)
CREATININE: 1.04 mg/dL (ref 0.61–1.24)
Chloride: 103 mmol/L (ref 101–111)
GFR calc Af Amer: >60 mL/min (ref >60)
Glucose, Bld: 146 mg/dL — ABNORMAL HIGH (ref 65–99)
POTASSIUM: 4 mmol/L (ref 3.5–5.1)
SODIUM: 136 mmol/L (ref 135–145)

## 2016-07-21 LAB — GLUCOSE, CAPILLARY
GLUCOSE-CAPILLARY: 159 mg/dL — AB (ref 65–99)
GLUCOSE-CAPILLARY: 118 mg/dL — AB (ref 65–99)
Glucose-Capillary: 136 mg/dL — ABNORMAL HIGH (ref 65–99)
Glucose-Capillary: 175 mg/dL — ABNORMAL HIGH (ref 65–99)

## 2016-07-21 MED ORDER — ACETAMINOPHEN 500 MG PO TABS
1000.0000 mg | ORAL_TABLET | Freq: Once | ORAL | Status: AC
  Administered 2016-07-22: 1000 mg via ORAL
  Filled 2016-07-21: qty 2

## 2016-07-21 MED ORDER — WARFARIN - PHARMACIST DOSING INPATIENT: Freq: Every day | Status: DC

## 2016-07-21 MED ORDER — FUROSEMIDE 20 MG PO TABS
20.0000 mg | ORAL_TABLET | Freq: Every day | ORAL | Status: DC
  Administered 2016-07-21 – 2016-07-23 (×3): 20 mg via ORAL
  Filled 2016-07-21 (×3): qty 1

## 2016-07-21 MED ORDER — MOVING RIGHT ALONG BOOK
Freq: Once | Status: AC
  Administered 2016-07-21: 1
  Filled 2016-07-21: qty 1

## 2016-07-21 MED ORDER — WARFARIN SODIUM 2.5 MG PO TABS
2.5000 mg | ORAL_TABLET | Freq: Once | ORAL | Status: AC
  Administered 2016-07-21: 2.5 mg via ORAL
  Filled 2016-07-21: qty 1

## 2016-07-21 MED ORDER — ASPIRIN 81 MG PO CHEW
81.0000 mg | CHEWABLE_TABLET | Freq: Every day | ORAL | Status: DC
  Administered 2016-07-22 – 2016-07-23 (×2): 81 mg via ORAL
  Filled 2016-07-21 (×2): qty 1

## 2016-07-21 NOTE — Progress Notes (Signed)
      [address]       [place] [address]             [phone]        CARDIOTHORACIC SURGERY PROGRESS NOTE   R2 Days Post-Op Procedure(s) (LRB): TRANSCATHETER AORTIC VALVE REPLACEMENT, TRANSFEMORAL (N/A) TRANSESOPHAGEAL ECHOCARDIOGRAM (TEE) (N/A)  Subjective: Looks and feels well.  No complaints.  Appetite good.  No SOB.  + several bowel movements yesterday  Objective: Vital signs: BP Readings from Last 1 Encounters:  07/21/16 (!) 115/56   Pulse Readings from Last 1 Encounters:  07/21/16 64   Resp Readings from Last 1 Encounters:  07/21/16 19   Temp Readings from Last 1 Encounters:  07/21/16 98.5 F (36.9 C) (Oral)    Hemodynamics: PAP: (37-47)/(13-24) 45/19 CVP:  [6 mmHg-20 mmHg] 9 mmHg  Mixed venous co-ox 55.0%  Physical Exam:  Rhythm:   Afib  Breath sounds: clear  Heart sounds:  Irregular w/ soft systolic murmur  Incisions:  n/a  Abdomen:  Soft, non-distended, non-tender  Extremities:  Warm, well-perfused    Intake/Output from previous day: 09/06 0701 - 09/07 0700 In: 1198.5 [P.O.:650; I.V.:448.5; IV Piggyback:100] Out: 1320 [Urine:1320] Intake/Output this shift: No intake/output data recorded.  Lab Results:  CBC: Recent Labs  07/20/16 0355 07/21/16 0440  WBC 8.8 5.8  HGB 10.1* 10.1*  HCT 30.3* 31.1*  PLT 146* 123*    BMET:  Recent Labs  07/20/16 0355 07/20/16 1841 07/21/16 0440  NA 133*  --  136  K 3.9  --  4.0  CL 98*  --  103  CO2 24  --  27  GLUCOSE 132*  --  146*  BUN 8  --  7  CREATININE 1.06 1.20 1.04  CALCIUM 7.8*  --  8.4*     PT/INR:   Recent Labs  07/19/16 1044  LABPROT 15.2  INR 1.19    CBG (last 3)   Recent Labs  07/20/16 1210 07/20/16 1552 07/20/16 2140  GLUCAP 175* 83 151*    ABG    Component Value Date/Time   PHART 7.394 07/19/2016 1054   PCO2ART 37.7 07/19/2016 1054   PO2ART 74.0 (L) 07/19/2016 1054   HCO3 23.4 07/19/2016 1054   TCO2 28 07/19/2016 1624   ACIDBASEDEF  2.0 07/19/2016 1054   O2SAT 55.0 07/21/2016 0449    CXR: n/a  Assessment/Plan: S/P Procedure(s) (LRB): TRANSCATHETER AORTIC VALVE REPLACEMENT, TRANSFEMORAL (N/A) TRANSESOPHAGEAL ECHOCARDIOGRAM (TEE) (N/A)  Doing very well POD2 TAVR Maintaining stable BP in rate-controlled Afib, co-ox down slightly on milrinone 0.25 Breathing comfortably w/ O2 sats 93-97% on room air, CXR looks good Chronic combined systolic and diastolic CHF - looks close to euvolemic at present Mitral regurgitation CAD - moderate 2 vessel disease Chronic persistent atrial fibrillation w/ episodes WCT last week - none since TAVR Cerebrovascular disease s/p RICA stent 8/28 Type II diabetes mellitus, adequate glycemic control   Mobilize  Resume PT, cardiac rehab  Milrinone weaning and medical Rx per Dr Haroldine Laws and heart failure team  Anticoagulation for Afib in patients s/p TAVR - enrolling in trial:  ASA + warfarin vs ASA + NOAC  Transfer step down  Rexene Alberts, MD 07/21/2016 7:58 AM

## 2016-07-21 NOTE — Progress Notes (Signed)
CARDIAC REHAB PHASE I   PRE:  Rate/Rhythm: 59 afib  BP:  Sitting: 108/51        SaO2: 99 RA  MODE:  Ambulation: 350 ft   POST:  Rate/Rhythm: 93 afib  BP:  Sitting: 136/96         SaO2: 100 RA  Pt ambulated 350 ft on RA, IV, rolling walker, gait belt, assist x2, mildly unsteady gait, tolerated fairly well with no complaints. Pt probably could have ambulated farther, however, pt incontinent of stool in hallway, returned to room. Pt assisted to bedside commode, did have small bowel movement. Pt to recliner after walk, feet elevated, call bell within reach. Will follow.   [place], RN, BSN 07/21/2016 2:16 PM

## 2016-07-21 NOTE — Clinical Social Work Note (Addendum)
Clinical Social Work Assessment  Patient Details  Name: [name] [name] MRN: [id number] Date of Birth: [birth year]  Date of referral:  07/21/16               Reason for consult:  Discharge Planning                Permission sought to share information with:  Facility Sport and exercise psychologist, Family Supports Permission granted to share information::  Yes, Verbal Permission Granted  Name::     Perry,Barbara  Agency::  SNFs  Relationship::  Niece  Contact Information:  [phone]  Housing/Transportation Living arrangements for the past 2 months:  Single Family Home Source of Information:  Patient, Other (Comment Required) (Niece) Patient Interpreter Needed:  None Criminal Activity/Legal Involvement Pertinent to Current Situation/Hospitalization:  No - Comment as needed Significant Relationships:  Other(Comment), Spouse (Niece) Lives with:  Spouse Do you feel safe going back to the place where you live?  No Need for family participation in patient care:  No (Coment)  Care giving concerns:  The patient is agreeable for short term rehab at discharge. Patient would like to rebuild his strength to return home.    Social Worker assessment / plan:  CSW met with patient at beside to complete assessment. Patient was resting comfortably in bed. CSW explained PT recommendation for SNF placement. CSW explained SNF search and placement process to the patient and answered  his questions. Patient report his wife has nursing assistance at home. Patient reported his supports as his niece Nile Dear. Per patient request CSW called [facility] Dear. CSW explained PT recommendation for SNF placement. CSW explained SNF search and placement process to the patient and answered  her questions. Patient's niece reported preferred SNF would be [facility]. CSW will follow up with bed offers.   Employment status:  Retired Forensic scientist:  Information systems manager PT Recommendations:  [place], Home with Stella / Referral to community resources:  [place]  Patient/Family's Response to care:  The patient appears happy with the care he is receiving in hospital and is appreciative of CSW assistance.  Patient/Family's Understanding of and Emotional Response to Diagnosis, Current Treatment, and Prognosis:  The patient has a good understanding of why he was admitted. He understands the care plan and what he will need post discharge.  Emotional Assessment Appearance:  Appears stated age Attitude/Demeanor/Rapport:   (Patient was appropriate.) Affect (typically observed):  Accepting, Appropriate, Calm Orientation:  Oriented to Self, Oriented to Place, Oriented to  Time, Oriented to Situation Alcohol / Substance use:  Not Applicable Psych involvement (Current and /or in the community):  No (Comment)  Discharge Needs  Concerns to be addressed:  Discharge Planning Concerns Readmission within the last 30 days:  No Current discharge risk:  Physical Impairment Barriers to Discharge:  Continued Medical Work up   [employer], LCSW 07/21/2016, 12:17 PM

## 2016-07-21 NOTE — Research (Signed)
I spoke to patient and family about the Envisage Study. The family decided the study was not a good idea due to wife being sick.They were afraid compliance would be an issue.

## 2016-07-21 NOTE — Progress Notes (Signed)
Pt transferred to [address] via wheelchair, vss, pt settled in bed, receiving RN and NT at bedside, all patient belongings transferred with patient and niece [name] at side.  Sherrie Mustache 1030

## 2016-07-21 NOTE — Progress Notes (Signed)
TAVR Team Note: Mr [name] [name] is progressing well. Reviewed am notes of Dr Haroldine Laws and Dr Roxy Manns. Pt has declined enrollment in the Envisage TAVI-AF trial. Will start warfarin today.   Sherren Mocha 07/21/2016 12:03 PM

## 2016-07-21 NOTE — Clinical Social Work Placement (Signed)
   CLINICAL SOCIAL WORK PLACEMENT  NOTE  Date:  07/21/2016  Patient Details  Name: [name] [name] MRN: [id number] Date of Birth: Nov 24, 1931  Clinical Social Work is seeking post-discharge placement for this patient at the [facility] level of care (*CSW will initial, date and re-position this form in  chart as items are completed):  Yes   Patient/family provided with [facility] Work Department's list of facilities offering this level of care within the geographic area requested by the patient (or if unable, by the patient's family).  Yes   Patient/family informed of their freedom to choose among providers that offer the needed level of care, that participate in Medicare, Medicaid or managed care program needed by the patient, have an available bed and are willing to accept the patient.  Yes   Patient/family informed of [facility]'s ownership interest in [facility] and [facility], as well as of the fact that they are under no obligation to receive care at these facilities.  PASRR submitted to EDS on       PASRR number received on       Existing PASRR number confirmed on 07/21/16     FL2 transmitted to all facilities in geographic area requested by pt/family on 07/21/16     FL2 transmitted to all facilities within larger geographic area on       Patient informed that his/her managed care company has contracts with or will negotiate with certain facilities, including the following:            Patient/family informed of bed offers received.  Patient chooses bed at       Physician recommends and patient chooses bed at      Patient to be transferred to   on  .  Patient to be transferred to facility by       Patient family notified on   of transfer.  Name of family member notified:        PHYSICIAN Please sign FL2     Additional Comment:    _______________________________________________ Samule Dry, LCSW 07/21/2016, 12:24  PM

## 2016-07-21 NOTE — Progress Notes (Signed)
Attempt made to call report, RN unavailable to take report at this time, will attempt to call report again.  Sherrie Mustache 8:33 AM

## 2016-07-21 NOTE — NC FL2 (Signed)
[place] MEDICAID FL2 LEVEL OF CARE SCREENING TOOL     IDENTIFICATION  Patient Name: [name] [name] Birthdate: [birth year] Sex: male Admission Date (Current Location): 06/28/2016  [facility] and [place] Number:  [employer] and Address:  [facility] [place], [place] [address] [place], [place] [address]      Provider Number: [id number]  Attending Physician Name and Address:  Rexene Alberts, MD  Relative Name and Phone Number:       Current Level of Care: Hospital Recommended Level of Care: [place] Prior Approval Number:    Date Approved/Denied:   PASRR Number:  [id number]   Discharge Plan: Home    Current Diagnoses: Patient Active Problem List   Diagnosis Date Noted  . S/P TAVR (transcatheter aortic valve replacement) 07/19/2016  . Mitral regurgitation   . VT (ventricular tachycardia) ([place])   . Cardiogenic shock ([place])   . Acute respiratory failure with hypoxia ([place])   . Systolic congestive heart failure ([place]) 06/28/2016  . Dyspnea 06/28/2016  . CAD (coronary artery disease), native coronary artery 06/28/2016  . Acute on chronic combined systolic and diastolic CHF, NYHA class 4 ([place])   . Anemia of chronic disease 04/12/2016  . Orthostatic hypotension 08/20/2015  . AKI (acute kidney injury) ([place]) 08/20/2015  . Asthma exacerbation 08/20/2015  . Cognitive dysfunction 05/29/2015  . Chronic back pain 07/29/2014  . Lumbar pain 06/23/2014  . Chronic pain of right ankle 05/30/2014  . Diabetic neuropathy ([place]) 05/30/2014  . Chest congestion 05/02/2013  . Wheezing 05/02/2013  . Unspecified constipation 04/30/2013  . Leg edema, left 04/27/2013  . Carotid stenosis 04/25/2012  . BPH (benign prostatic hypertrophy) with urinary obstruction 09/27/2011  . Dyspnea on exertion 08/01/2011  . Diabetes mellitus ([place]) 07/14/2011  . Hypertension   . Atrial fibrillation ([place])   . Hyperlipidemia   . History of noncompliance with medical  treatment   . Aortic stenosis   . Cerebrovascular disease 07/15/2010    Orientation RESPIRATION BLADDER Height & Weight     Self, Time, Situation, Place  Normal Continent Weight: 206 lb 12.7 oz (93.8 kg) Height:  6\' 1"  (185.4 cm)  BEHAVIORAL SYMPTOMS/MOOD NEUROLOGICAL BOWEL NUTRITION STATUS   (None)  (none) Continent Diet (Heart Healthy/ Carb Modified)  AMBULATORY STATUS COMMUNICATION OF NEEDS Skin   Limited Assist Verbally Surgical wounds (Incision Closed: LT Groin and RT Groin)                       Personal Care Assistance Level of Assistance  Bathing, Feeding, Dressing Bathing Assistance: Limited assistance Feeding assistance: Independent Dressing Assistance: Limited assistance     Functional Limitations Info  Sight, Hearing, Speech Sight Info: Adequate Hearing Info: Adequate Speech Info: Adequate    SPECIAL CARE FACTORS FREQUENCY  PT (By licensed PT), OT (By licensed OT)     PT Frequency: 5/ week OT Frequency: 5/ week            Contractures Contractures Info: Not present    Additional Factors Info  Code Status, Allergies, Insulin Sliding Scale, Psychotropic Code Status Info: Full Allergies Info: Ambien Zolpidem Tartrate, Lipitor Atorvastatin Calcium, Ranitidine, Simvastatin, Xanax Xr Alprazolam Er, Cholestatin, Clopidogrel Bisulfate Psychotropic Info: traZODone (DESYREL) tablet 100 mg Dose: 100 mg Freq: Daily at bedtime Route: PO Insulin Sliding Scale Info: insulin aspart (novoLOG) injection 0-15 Units Dose: 0-15 Units Freq: 3 times daily with meals Route: [place]       Current Medications (07/21/2016):  This is the current hospital active medication list Current Facility-Administered Medications  Medication Dose Route Frequency Provider Last Rate Last Dose  . 0.9 %  sodium chloride infusion  250 mL Intravenous PRN Rexene Alberts, MD      . amiodarone (PACERONE) tablet 200 mg  200 mg Oral Daily Jolaine Artist, MD   200 mg at 07/21/16 0900  . [START ON  07/22/2016] aspirin chewable tablet 81 mg  81 mg Oral Daily Sherren Mocha, MD      . bisacodyl (DULCOLAX) suppository 10 mg  10 mg Rectal Daily PRN Amy D Clegg, NP   10 mg at 07/20/16 1330  . docusate sodium (COLACE) capsule 200 mg  200 mg Oral BID Rexene Alberts, MD   200 mg at 07/20/16 2103  . furosemide (LASIX) tablet 20 mg  20 mg Oral Daily Jolaine Artist, MD   20 mg at 07/21/16 0900  . insulin aspart (novoLOG) injection 0-15 Units  0-15 Units Subcutaneous TID WC Rexene Alberts, MD   2 Units at 07/21/16 [phone]  . metoprolol (LOPRESSOR) injection 2.5-5 mg  2.5-5 mg Intravenous Q2H PRN Rexene Alberts, MD      . milrinone (PRIMACOR) 20 MG/100 ML (0.2 mg/mL) infusion  0.125 mcg/kg/min Intravenous Continuous Shaune Pascal Bensimhon, MD 3.6 mL/hr at 07/21/16 1000 0.125 mcg/kg/min at 07/21/16 1000  . nortriptyline (PAMELOR) capsule 10 mg  10 mg Oral QHS PRN Rexene Alberts, MD      . ondansetron [facility]) injection 4 mg  4 mg Intravenous Q6H PRN Rexene Alberts, MD      . pantoprazole (PROTONIX) EC tablet 40 mg  40 mg Oral Daily Rexene Alberts, MD   40 mg at 07/21/16 0900  . polyethylene glycol (MIRALAX / GLYCOLAX) packet 17 g  17 g Oral Daily Rexene Alberts, MD   17 g at 07/20/16 0954  . sodium chloride flush (NS) 0.9 % injection 3 mL  3 mL Intravenous Q12H Rexene Alberts, MD   3 mL at 07/21/16 1000  . sodium chloride flush (NS) 0.9 % injection 3 mL  3 mL Intravenous PRN Rexene Alberts, MD      . spironolactone (ALDACTONE) tablet 12.5 mg  12.5 mg Oral Daily Amy D Clegg, NP   12.5 mg at 07/21/16 0900  . traZODone (DESYREL) tablet 100 mg  100 mg Oral QHS Rexene Alberts, MD   100 mg at 07/20/16 2103     Discharge Medications: Please see discharge summary for a list of discharge medications.  Relevant Imaging Results:  Relevant Lab Results:   Additional Information SSN: [id number]  Samule Dry, LCSW

## 2016-07-21 NOTE — Care Management Important Message (Signed)
Important Message  Patient Details  Name: [name] [name] MRN: [id number] Date of Birth: 12-Apr-1932   Medicare Important Message Given:  Yes    Aydrian Halpin Abena 07/21/2016, 10:30 AM

## 2016-07-21 NOTE — Progress Notes (Signed)
Patient ID: [name] [name], male   DOB: [birth year], 82 y.o.   MRN: [id number]   Advanced Heart Failure Rounding Note   Subjective:    Developed frank hematuria 07/10/16. Heparin stopped. UA and urine cytology sent. UA + for blood and WBCs.   S/p R Carotid stent 07/11/16  Urology saw 07/12/16 for hematuria/urinary retention. Thought to be likely due to infection. Cipro x 7 days + flomax. Had CBI catheter which is now out   Had VT on 9/2. Started IV amio and electrolytes supped. Milrinone turned down to 0.125.  VT improved.  Was bradycardic and digoxin stopped (level 1.4) on 9/2.   S/P TAVR 9/5.  Doing well. On milrinone.    Objective:   Weight Range:  Vital Signs:   Temp:  [97.8 F (36.6 C)-99.1 F (37.3 C)] 98.5 F (36.9 C) (09/07 0000) Pulse Rate:  [56-82] 66 (09/07 0600) Resp:  [15-28] 19 (09/07 0600) BP: (93-154)/(47-83) 117/60 (09/07 0400) SpO2:  [92 %-100 %] 94 % (09/07 0600) Arterial Line BP: (103-141)/(41-57) 141/54 (09/06 1200) Last BM Date: 07/20/16  Weight change: Filed Weights   07/18/16 0500 07/19/16 0400 07/20/16 0600  Weight: 94.6 kg (208 lb 8 oz) 94.2 kg (207 lb 10.8 oz) 96.1 kg (211 lb 13.8 oz)    Intake/Output:   Intake/Output Summary (Last 24 hours) at 07/21/16 0639 Last data filed at 07/21/16 0600  Gross per 24 hour  Intake          1234.68 ml  Output             1070 ml  Net           164.68 ml     Physical Exam: CVP 7-8 General:  Lying in bed.  HEENT: Normal Neck: supple. JVP 7-8 Carotids 2+ bilat; no bruits. No thyromegaly or nodule noted. Swan RIJ Cor: PMI nondisplaced. Irregular No S3.  S2 crisp   Lungs: CTAB, normal effort on 3 liters [place] Abdomen: soft, NT, ND, no HSM. No bruits or masses. +BS  Extremities: no cyanosis, clubbing, rash, no edema. R and LLE SCDs Neuro: alert and oriented x3.   Telemetry: A fib 50s   Labs: Basic Metabolic Panel:  Recent Labs Lab 07/17/16 0400 07/18/16 0400 07/19/16 0433  07/19/16 0928  07/19/16 0959 07/19/16 1055 07/19/16 1600 07/19/16 1624 07/20/16 0355 07/20/16 1841 07/21/16 0440  NA 133* 131* 136  < > 135 135 138  --  138 133*  --  136  K 3.5 3.3* 4.0  < > 4.2 4.5 4.0  --  3.7 3.9  --  4.0  CL 102 100* 104  < > 100* 100*  --   --  99* 98*  --  103  CO2 25 25 28   --   --   --   --   --   --  24  --  27  GLUCOSE 145* 256* 158*  < > 179* 181* 181*  --  137* 132*  --  146*  BUN 14 13 8   < > 8 8  --   --  7 8  --  7  CREATININE 1.29* 1.28* 1.14  < > 0.90 1.10  --  1.12 1.00 1.06 1.20 1.04  CALCIUM 8.4* 8.2* 8.6*  --   --   --   --   --   --  7.8*  --  8.4*  MG 1.9 1.8 1.8  --   --   --   --  2.7*  --  2.1 1.9  --   < > = values in this interval not displayed.  Liver Function Tests: No results for input(s): AST, ALT, ALKPHOS, BILITOT, PROT, ALBUMIN in the last 168 hours. No results for input(s): LIPASE, AMYLASE in the last 168 hours. No results for input(s): AMMONIA in the last 168 hours.  CBC:  Recent Labs Lab 07/19/16 0433  07/19/16 1044 07/19/16 1055 07/19/16 1600 07/19/16 1624 07/20/16 0355 07/21/16 0440  WBC 6.2  --  6.2  --  6.3  --  8.8 5.8  HGB 10.3*  < > 10.2* 9.9* 11.0* 11.2* 10.1* 10.1*  HCT 30.6*  < > 30.5* 29.0* 33.4* 33.0* 30.3* 31.1*  MCV 89.0  --  89.2  --  89.3  --  89.1 90.7  PLT 139*  --  122*  --  132*  --  146* 123*  < > = values in this interval not displayed.  Cardiac Enzymes: No results for input(s): CKTOTAL, CKMB, CKMBINDEX, TROPONINI in the last 168 hours.  BNP: BNP (last 3 results)  Recent Labs  05/03/16 1305 05/28/16 1617 06/28/16 1316  BNP 232.0* 294.0* 222.0*    ProBNP (last 3 results) No results for input(s): PROBNP in the last 8760 hours.    Other results:  Imaging: Dg Chest Port 1 View  Result Date: 07/20/2016 CLINICAL DATA:  Chest pain and shortness of breath EXAM: PORTABLE CHEST 1 VIEW COMPARISON:  July 19, 2016 FINDINGS: Swan-Ganz catheter tip is in the proximal right intralobar pulmonary artery.  Right subclavian catheter tip is at the cavoatrial junction. No pneumothorax. There has been significant partial clearing of interstitial edema. Only a slight amount of interstitial edema remains. There is no airspace consolidation. Heart is borderline enlarged with mild pulmonary venous hypertension. Prosthetic valve unchanged in position. There is aortic atherosclerosis. IMPRESSION: Significant clearing of interstitial edema with only mild interstitial edema remaining. No new opacity. There remains a degree of pulmonary venous hypertension. Catheter positions are unchanged without pneumothorax. Electronically Signed   By: Lowella Grip III M.D.   On: 07/20/2016 07:40   Dg Chest Port 1 View  Result Date: 07/19/2016 CLINICAL DATA:  Status post aortic valve replacement.  CHF. EXAM: PORTABLE CHEST 1 VIEW COMPARISON:  Portable chest x-ray of June 30, 2016 FINDINGS: The lungs are mildly hypoinflated. The pulmonary vascularity is engorged and the interstitial markings are increased diffusely. The cardiac silhouette is enlarged. The Swan-Ganz catheter tip projects over proximal main pulmonary artery on the right. The right-sided PICC line tip projects over the distal third of the SVC. There is no definite pleural effusion. The aortic valve cage is visible. IMPRESSION: Congestive heart failure with pulmonary interstitial edema. No alveolar pneumonia. The Swan-Ganz catheter tip may be wedged in a proximal main pulmonary artery branch on the right. Electronically Signed   By: David  Martinique M.D.   On: 07/19/2016 11:27     Medications:     Scheduled Medications: . amiodarone  200 mg Oral Daily  . aspirin EC  325 mg Oral Daily  . cefUROXime (ZINACEF)  IV  1.5 g Intravenous Q12H  . docusate sodium  200 mg Oral BID  . insulin aspart  0-15 Units Subcutaneous TID WC  . pantoprazole  40 mg Oral Daily  . polyethylene glycol  17 g Oral Daily  . sodium chloride flush  3 mL Intravenous Q12H  . spironolactone   12.5 mg Oral Daily  . traZODone  100 mg Oral QHS  Infusions: . milrinone 0.25 mcg/kg/min (07/21/16 0600)  . nitroGLYCERIN Stopped (07/19/16 1441)  . phenylephrine (NEO-SYNEPHRINE) Adult infusion Stopped (07/20/16 0820)    PRN Medications: sodium chloride, bisacodyl, metoprolol, nortriptyline, ondansetron (ZOFRAN) IV, sodium chloride flush   Assessment:   1. Cardiogenic Shock- TEE with EF 30-35% 2. Acute Respiratory Failure 3. A fib RVR 4. CAD -  2 vessel disease 5. Valvular Heart Disease-  Severe AS/moderate-severe MR (MR may be functional).  6. Caroltid Stenosis- Severe carotid stenosis RICA >80% stenosis by carotid dopplers, CTA head/neck with critical >90% RICA stenosis an [id number] LICA.     --s/p R carotid stent 8/28 7. Bilateral Effusions  8. Nasopharyngeal mass 9. Rash- 10. Hyperkalemia 11. Hematuria 12. UTI 13. VT 14. S/P AVR  Plan/Discussion:    S/p R carotid stent 07/11/16. Continue ASA (allergic to plavix and too high risk for Brilinta.   S/p TAVR 9/5  Doing well. Co-ox 55% on milrinone 0.25. Will wean to 0.125 and see if he tolerates. May need to go back up. Volume status looks good.   VT quiescent. Now on po amio. Afib with good rate control - occasionally bradycardic. Being enrolled in NOAC trial. Consider DC-CV after 4 weeks of AC.   Bcx - NGTD. On cefuroxime s/p TAVR will get 4 doses.   2 vessel CAD, on ASA 81 and low dose Crestor (prior myalgias with simvastatin).   Cardiac rehab to see. Can go to floor.   Length of Stay: 23  [name], MD 6:39 AM Advanced Heart Failure Team Pager [phone] (M-F; 7a - 4p)  Please contact [facility] for night-coverage after hours (4p -7a ) and weekends on [email or web address]

## 2016-07-21 NOTE — Progress Notes (Signed)
ANTICOAGULATION CONSULT NOTE - Follow Up Consult  Pharmacy Consult for Heparin  Indication: atrial fibrillation  Patient Measurements: Height: 6\' 1"  (185.4 cm) Weight: 206 lb 12.7 oz (93.8 kg) IBW/kg (Calculated) : 79.9   Vital Signs: Temp: 97.7 F (36.5 C) (09/07 0900) Temp Source: Oral (09/07 0900) BP: 96/43 (09/07 1000) Pulse Rate: 59 (09/07 1000)  Labs:  Recent Labs  07/18/16 1245  07/19/16 0439  07/19/16 1044  07/19/16 1600 07/19/16 1624 07/20/16 0355 07/20/16 1841 07/21/16 0440  HGB  --   < >  --   < > 10.2*  < > 11.0* 11.2* 10.1*  --  10.1*  HCT  --   < >  --   < > 30.5*  < > 33.4* 33.0* 30.3*  --  31.1*  PLT  --   < >  --   --  122*  --  132*  --  146*  --  123*  APTT  --   --   --   --  44*  --   --   --   --   --   --   LABPROT  --   --   --   --  15.2  --   --   --   --   --   --   INR  --   --   --   --  1.19  --   --   --   --   --   --   HEPARINUNFRC 0.33  --  0.39  --   --   --   --   --   --   --   --   CREATININE  --   < >  --   < >  --   --  1.12 1.00 1.06 1.20 1.04  < > = values in this interval not displayed.  Estimated Creatinine Clearance: 61.9 mL/min (by C-G formula based on SCr of 1.04 mg/dL).   Assessment: S/p admission for cardiogenic shock now resolved.  S/P carotid stent last week and s/p TAVR 9/5.  Hx Afib on warfarin PTA 5mg  MWFSu/ 2.5mg  TTSa Has been on heparin during hospital stay and warfarin on old for procedures.  CBC stable, last INR 1.2 9/5 preop. Plan to restart warfarin today.  Willl dose cautiously given recent hematuria - now resolved and new amiodarone.    Goal of Therapy:  INR 2-3 Monitor platelets by anticoagulation protocol: Yes   Plan:  Warfarin 2.5mg  x1 tonight  Daily Protime Monitor s/s bleeding  Bonnita Nasuti Pharm.D. CPP, BCPS Clinical Pharmacist [phone] 07/21/2016 12:20 PM

## 2016-07-22 LAB — PROTIME-INR
INR: 1.2
PROTHROMBIN TIME: 15.3 s — AB (ref 11.4–15.2)

## 2016-07-22 LAB — GLUCOSE, CAPILLARY
GLUCOSE-CAPILLARY: 130 mg/dL — AB (ref 65–99)
Glucose-Capillary: 141 mg/dL — ABNORMAL HIGH (ref 65–99)
Glucose-Capillary: 150 mg/dL — ABNORMAL HIGH (ref 65–99)
Glucose-Capillary: 125 mg/dL — ABNORMAL HIGH (ref 65–99)

## 2016-07-22 LAB — CBC
HEMATOCRIT: 30.5 % — AB (ref 39.0–52.0)
Hemoglobin: 10.2 g/dL — ABNORMAL LOW (ref 13.0–17.0)
MCH: 29.7 pg (ref 26.0–34.0)
MCHC: 33.4 g/dL (ref 30.0–36.0)
MCV: 88.9 fL (ref 78.0–100.0)
PLATELETS: 153 10*3/uL (ref 150–400)
RBC: 3.43 MIL/uL — AB (ref 4.22–5.81)
RDW: 14.5 % (ref 11.5–15.5)
WBC: 9.1 10*3/uL (ref 4.0–10.5)

## 2016-07-22 LAB — BASIC METABOLIC PANEL
Anion gap: 8 (ref 5–15)
BUN: 10 mg/dL (ref 6–20)
CHLORIDE: 100 mmol/L — AB (ref 101–111)
CO2: 26 mmol/L (ref 22–32)
Calcium: 8.3 mg/dL — ABNORMAL LOW (ref 8.9–10.3)
Creatinine, Ser: 1.03 mg/dL (ref 0.61–1.24)
Glucose, Bld: 148 mg/dL — ABNORMAL HIGH (ref 65–99)
POTASSIUM: 3.9 mmol/L (ref 3.5–5.1)
SODIUM: 134 mmol/L — AB (ref 135–145)

## 2016-07-22 LAB — CARBOXYHEMOGLOBIN
Carboxyhemoglobin: 1.5 % (ref 0.5–1.5)
METHEMOGLOBIN: 0.9 % (ref 0.0–1.5)
O2 Saturation: 65.4 %
Total hemoglobin: 10.7 g/dL — ABNORMAL LOW (ref 12.0–16.0)

## 2016-07-22 LAB — CULTURE, BLOOD (ROUTINE X 2)
CULTURE: NO GROWTH
CULTURE: NO GROWTH

## 2016-07-22 LAB — MAGNESIUM: Magnesium: 1.8 mg/dL (ref 1.7–2.4)

## 2016-07-22 MED ORDER — MAGNESIUM SULFATE 2 GM/50ML IV SOLN
2.0000 g | Freq: Once | INTRAVENOUS | Status: AC
  Administered 2016-07-22: 2 g via INTRAVENOUS
  Filled 2016-07-22: qty 50

## 2016-07-22 MED ORDER — WARFARIN SODIUM 2.5 MG PO TABS
2.5000 mg | ORAL_TABLET | Freq: Every day | ORAL | Status: DC
  Administered 2016-07-22: 2.5 mg via ORAL
  Filled 2016-07-22: qty 1

## 2016-07-22 MED ORDER — HYDROCODONE-ACETAMINOPHEN 5-325 MG PO TABS
1.0000 | ORAL_TABLET | Freq: Three times a day (TID) | ORAL | Status: DC | PRN
  Administered 2016-07-22: 1 via ORAL
  Filled 2016-07-22: qty 1

## 2016-07-22 MED ORDER — POTASSIUM CHLORIDE CRYS ER 20 MEQ PO TBCR
20.0000 meq | EXTENDED_RELEASE_TABLET | Freq: Once | ORAL | Status: AC
  Administered 2016-07-22: 20 meq via ORAL
  Filled 2016-07-22: qty 1

## 2016-07-22 MED ORDER — SODIUM CHLORIDE 0.9% FLUSH: 10.0000 mL | INTRAVENOUS | Status: DC | PRN

## 2016-07-22 MED ORDER — LOSARTAN POTASSIUM 25 MG PO TABS
12.5000 mg | ORAL_TABLET | Freq: Every day | ORAL | Status: DC
  Administered 2016-07-22 – 2016-07-23 (×2): 12.5 mg via ORAL
  Filled 2016-07-22 (×2): qty 1

## 2016-07-22 MED ORDER — METFORMIN HCL 500 MG PO TABS
250.0000 mg | ORAL_TABLET | Freq: Every day | ORAL | Status: DC
  Administered 2016-07-22 – 2016-07-23 (×2): 250 mg via ORAL
  Filled 2016-07-22 (×2): qty 1

## 2016-07-22 NOTE — Evaluation (Signed)
Physical Therapy Evaluation Patient Details Name: HALLET OBENOUR MRN: [id number] DOB: May 11, 1932 Today's Date: 07/22/2016   History of Present Illness  Patient is an 82 yo male admitted 06/28/16 with marked DOE.  Patient with cardiogenic shock, acute respiratory failure, CAD-2 vessel disease, valvular disease - severe AS/severe MR, carotid stenosis - RICA stenosis 80-90, Afib with RVR.      Pt had TAVR 07/21/16.  PMH:  CAD, HTN, HLD, DM, systolic CHF w reduced EF 35-40%, carotid stenosis and aortic valve stenosis, TIA/CVA, Afib, CKD, DJD, PVD  Clinical Impression  Pt admitted with above diagnosis. Pt currently with functional limitations due to the deficits listed below (see PT Problem List). PT reordered after TAVR was completed 9/7.  Pt was able to ambulate in hallways.  Still needs incr cues for posture and some balance issues as well. Feel that SNF is still warranted.  Pt and family state that they think [facility] Sat or Sun.  Will continue PT.   Pt will benefit from skilled PT to increase their independence and safety with mobility to allow discharge to the venue listed below.    Follow Up Recommendations SNF;Supervision/Assistance - 24 hour    Equipment Recommendations  None recommended by PT    Recommendations for Other Services       Precautions / Restrictions Precautions Precautions: Fall Restrictions Weight Bearing Restrictions: No      Mobility  Bed Mobility               General bed mobility comments: in chair on arrival.   Transfers Overall transfer level: Needs assistance Equipment used: Rolling walker (2 wheeled) Transfers: Sit to/from Stand Sit to Stand: Min guard         General transfer comment: Pt needed cues to put hands on knees to stand.  Pt needed to use momentum.    Ambulation/Gait Ambulation/Gait assistance: Min assist Ambulation Distance (Feet): 180 Feet Assistive device: Rolling walker (2 wheeled) Gait Pattern/deviations:  Step-through pattern;Decreased stride length;Trunk flexed Gait velocity: decreased Gait velocity interpretation: Below normal speed for age/gender General Gait Details: cues for posture and for walker safety, assist for balance and step length; Pt needed cues to take larger steps especially with the left foot.   Stairs            Wheelchair Mobility    Modified Rankin (Stroke Patients Only)       Balance Overall balance assessment: Needs assistance Sitting-balance support: No upper extremity supported;Feet supported Sitting balance-Leahy Scale: Fair     Standing balance support: Bilateral upper extremity supported;During functional activity Standing balance-Leahy Scale: Poor Standing balance comment: relies on UE support for balance.              High level balance activites: Direction changes;Turns;Sudden stops High Level Balance Comments: min to mod assist with LOB with RW. LOB with challenges with RW             Pertinent Vitals/Pain Pain Assessment: No/denies pain  HR 87-104bpm, 96% on RA DOE 2/4.     Home Living Family/patient expects to be discharged to:: Private residence Living Arrangements: Spouse/significant other Available Help at Discharge: Family;Available 24 hours/day Type of Home: House Home Access: Stairs to enter Entrance Stairs-Rails: Right Entrance Stairs-Number of Steps: 6 Home Layout: One level Home Equipment: Walker - 2 wheels;Cane - single point;Wheelchair - manual      Prior Function Level of Independence: Independent with assistive device(s)  Comments: Uses cane occasionally     Hand Dominance        Extremity/Trunk Assessment   Upper Extremity Assessment: Defer to OT evaluation           Lower Extremity Assessment: Generalized weakness      Cervical / Trunk Assessment: Normal  Communication   Communication: No difficulties  Cognition Arousal/Alertness: Awake/alert Behavior During Therapy: WFL for  tasks assessed/performed;Restless Overall Cognitive Status: Within Functional Limits for tasks assessed                      General Comments      Exercises General Exercises - Lower Extremity Ankle Circles/Pumps: AROM;5 reps;Both;Supine Quad Sets: AROM;Both;10 reps;Supine Long Arc Quad: AROM;Both;10 reps;Seated Heel Slides: AROM;Both;10 reps;Supine      Assessment/Plan    PT Assessment Patient needs continued PT services  PT Diagnosis Abnormality of gait;Generalized weakness   PT Problem List Decreased strength;Decreased activity tolerance;Decreased balance;Decreased mobility;Decreased knowledge of use of DME;Cardiopulmonary status limiting activity  PT Treatment Interventions DME instruction;Gait training;Functional mobility training;Therapeutic activities;Balance training;Patient/family education   PT Goals (Current goals can be found in the Care Plan section) Acute Rehab PT Goals Patient Stated Goal: To get better PT Goal Formulation: With patient Time For Goal Achievement: 08/05/16 Potential to Achieve Goals: Good    Frequency Min 3X/week   Barriers to discharge Decreased caregiver support      Co-evaluation               End of Session Equipment Utilized During Treatment: Gait belt Activity Tolerance: Patient limited by fatigue Patient left: in chair;with call bell/phone within reach;with chair alarm set;with family/visitor present Nurse Communication: Mobility status         Time: 1144-1200 PT Time Calculation (min) (ACUTE ONLY): 16 min   Charges:   PT Evaluation $PT Eval Moderate Complexity: 1 Procedure     PT G CodesDenice Paradise 2016/07/28, 1:42 PM [facility] Acute Rehabilitation [phone] (pager)

## 2016-07-22 NOTE — Progress Notes (Signed)
Patient ID: [name] [name], male   DOB: [birth year], 82 y.o.   MRN: [id number]   Advanced Heart Failure Rounding Note   Subjective:    Developed frank hematuria 07/10/16. Heparin stopped. UA and urine cytology sent. UA + for blood and WBCs.   S/p R Carotid stent 07/11/16  Urology saw 07/12/16 for hematuria/urinary retention. Thought to be likely due to infection. Cipro x 7 days + flomax. Had CBI catheter which is now out   Had VT on 9/2. Started IV amio and electrolytes supped. Milrinone turned down to 0.125.  VT improved.  Was bradycardic and digoxin stopped (level 1.4) on 9/2.   S/P TAVR 9/5.  Yesterday milrinone was cut back to 0.125 mcg. Today CO-OX 65%.   Complaining of pain when he takes a deep breath. Says this has been going on for the last few days.    Objective:   Weight Range:  Vital Signs:   Temp:  [97.7 F (36.5 C)-98.4 F (36.9 C)] 98.4 F (36.9 C) (09/08 0557) Pulse Rate:  [59-80] 69 (09/08 0557) Resp:  [17-20] 18 (09/08 0557) BP: (96-144)/(42-66) 111/42 (09/08 0557) SpO2:  [90 %-100 %] 95 % (09/08 0557) Weight:  [206 lb 6.4 oz (93.6 kg)] 206 lb 6.4 oz (93.6 kg) (09/08 0557) Last BM Date: 07/20/16  Weight change: Filed Weights   07/20/16 0600 07/21/16 0500 07/22/16 0557  Weight: 211 lb 13.8 oz (96.1 kg) 206 lb 12.7 oz (93.8 kg) 206 lb 6.4 oz (93.6 kg)    Intake/Output:   Intake/Output Summary (Last 24 hours) at 07/22/16 0839 Last data filed at 07/22/16 0445  Gross per 24 hour  Intake            437.7 ml  Output              751 ml  Net           -313.3 ml     Physical Exam: General:  NAD in the chair. [name]  HEENT: Normal Neck: supple. JVP 7-8 Carotids 2+ bilat; no bruits. No thyromegaly or nodule noted. Cor: PMI nondisplaced. Irregular No S3.  S2 crisp   Lungs: CTAB, normal effort on 3 liters [place] Abdomen: soft, NT, ND, no HSM. No bruits or masses. +BS  Extremities: no cyanosis, clubbing, rash, no edema.  Neuro: alert and oriented x3.   Telemetry: A  fib 60s   Labs: Basic Metabolic Panel:  Recent Labs Lab 07/18/16 0400 07/19/16 0433  07/19/16 0959 07/19/16 1055 07/19/16 1600 07/19/16 1624 07/20/16 0355 07/20/16 1841 07/21/16 0440 07/22/16 0500  NA 131* 136  < > 135 138  --  138 133*  --  136 134*  K 3.3* 4.0  < > 4.5 4.0  --  3.7 3.9  --  4.0 3.9  CL 100* 104  < > 100*  --   --  99* 98*  --  103 100*  CO2 25 28  --   --   --   --   --  24  --  27 26  GLUCOSE 256* 158*  < > 181* 181*  --  137* 132*  --  146* 148*  BUN 13 8  < > 8  --   --  7 8  --  7 10  CREATININE 1.28* 1.14  < > 1.10  --  1.12 1.00 1.06 1.20 1.04 1.03  CALCIUM 8.2* 8.6*  --   --   --   --   --  7.8*  --  8.4* 8.3*  MG 1.8 1.8  --   --   --  2.7*  --  2.1 1.9  --  1.8  < > = values in this interval not displayed.  Liver Function Tests: No results for input(s): AST, ALT, ALKPHOS, BILITOT, PROT, ALBUMIN in the last 168 hours. No results for input(s): LIPASE, AMYLASE in the last 168 hours. No results for input(s): AMMONIA in the last 168 hours.  CBC:  Recent Labs Lab 07/19/16 1044  07/19/16 1600 07/19/16 1624 07/20/16 0355 07/21/16 0440 07/22/16 0500  WBC 6.2  --  6.3  --  8.8 5.8 9.1  HGB 10.2*  < > 11.0* 11.2* 10.1* 10.1* 10.2*  HCT 30.5*  < > 33.4* 33.0* 30.3* 31.1* 30.5*  MCV 89.2  --  89.3  --  89.1 90.7 88.9  PLT 122*  --  132*  --  146* 123* 153  < > = values in this interval not displayed.  Cardiac Enzymes: No results for input(s): CKTOTAL, CKMB, CKMBINDEX, TROPONINI in the last 168 hours.  BNP: BNP (last 3 results)  Recent Labs  05/03/16 1305 05/28/16 1617 06/28/16 1316  BNP 232.0* 294.0* 222.0*    ProBNP (last 3 results) No results for input(s): PROBNP in the last 8760 hours.    Other results:  Imaging: No results found.   Medications:     Scheduled Medications: . amiodarone  200 mg Oral Daily  . aspirin  81 mg Oral Daily  . docusate sodium  200 mg Oral BID  . furosemide  20 mg Oral Daily  . insulin aspart   0-15 Units Subcutaneous TID WC  . metFORMIN  250 mg Oral Q breakfast  . pantoprazole  40 mg Oral Daily  . polyethylene glycol  17 g Oral Daily  . potassium chloride  20 mEq Oral Once  . sodium chloride flush  3 mL Intravenous Q12H  . spironolactone  12.5 mg Oral Daily  . traZODone  100 mg Oral QHS  . warfarin  2.5 mg Oral q1800  . Warfarin - Pharmacist Dosing Inpatient   Does not apply q1800    Infusions: . milrinone 0.125 mcg/kg/min (07/21/16 2007)    PRN Medications: sodium chloride, bisacodyl, metoprolol, nortriptyline, ondansetron (ZOFRAN) IV, sodium chloride flush, sodium chloride flush   Assessment:   1. Cardiogenic Shock- TEE with EF 30-35% 2. Acute Respiratory Failure 3. A fib: Persistent.  4. CAD -  2 vessel disease 5. Valvular Heart Disease-  Severe AS/moderate-severe MR (MR may be functional).  6. Caroltid Stenosis- Severe carotid stenosis RICA >80% stenosis by carotid dopplers, CTA head/neck with critical >90% RICA stenosis an [id number] LICA.     --s/p R carotid stent 8/28 7. Bilateral Effusions  8. Nasopharyngeal mass 9. Rash- 10. Hyperkalemia 11. Hematuria 12. UTI 13. VT 14. S/P AVR  Plan/Discussion:    S/p R carotid stent 07/11/16. Continue ASA (allergic to plavix and too high risk for Brilinta.   S/p TAVR 9/5. Completed post op antibiotic course. Add vicodin for pain. On stool softner.   Co-ox 65% on milrinone 0.125. Stop and see if he tolerates. May need to go back on. At one point he was on dig but this was stopped due to bradycardia. Volume status stable.  Continue lasix 20 mg daily. Continue 12.5 mg spiro. Add 12.5 mg losartan daily. Renal function stable.   VT quiescent. Now on 200 mg amio daily.  Afib with good rate control - occasionally bradycardic. Being  enrolled in NOAC trial. Consider DC-CV after 4 weeks of AC.   2 vessel CAD, on ASA 81 and low dose Crestor (prior myalgias with simvastatin).    Length of Stay: [place], 8:39 AM Advanced  Heart Failure Team Pager [phone] (M-F; 7a - 4p)  Please contact [facility] for night-coverage after hours (4p -7a ) and weekends on [email or web address]  Patient seen with NP, agree with the above note.  Remains in rate-controlled atrial fibrillation.  Volume looks ok on po Lasix. We have turned off milrinone today and added losartan 12.5 mg daily.  Will check co-ox this afternoon to make sure he can stay off the milrinone.    Loralie Champagne 07/22/2016 1:19 PM

## 2016-07-22 NOTE — Anesthesia Postprocedure Evaluation (Signed)
Anesthesia Post Note  Patient: Georgi A Herbers  Procedure(s) Performed: Procedure(s) (LRB): TRANSCATHETER AORTIC VALVE REPLACEMENT, TRANSFEMORAL (N/A) TRANSESOPHAGEAL ECHOCARDIOGRAM (TEE) (N/A)  Patient location during evaluation: ICU Anesthesia Type: General Level of consciousness: awake Pain management: pain level controlled Vital Signs Assessment: post-procedure vital signs reviewed and stable Respiratory status: spontaneous breathing Cardiovascular status: stable Postop Assessment: no signs of nausea or vomiting Anesthetic complications: no    Last Vitals:  Vitals:   07/21/16 2338 07/22/16 0557  BP: (!) 144/66 (!) 111/42  Pulse: 80 69  Resp: 18 18  Temp:  36.9 C    Last Pain:  Vitals:   07/22/16 0557  TempSrc: Oral  PainSc:                  Hoang Pettingill

## 2016-07-22 NOTE — Progress Notes (Addendum)
[address]       [place] [address]             [phone]        3 Days Post-Op Procedure(s) (LRB): TRANSCATHETER AORTIC VALVE REPLACEMENT, TRANSFEMORAL (N/A) TRANSESOPHAGEAL ECHOCARDIOGRAM (TEE) (N/A)  Subjective: Patient with complaints of headache and "when he takes a deep breath hurts down middle of his chest into his belly"  Objective: Vital signs in last 24 hours: Temp:  [97.2 F (36.2 C)-98.4 F (36.9 C)] 98.4 F (36.9 C) (09/08 0557) Pulse Rate:  [59-80] 69 (09/08 0557) Cardiac Rhythm: Atrial fibrillation (09/07 1959) Resp:  [17-21] 18 (09/08 0557) BP: (96-144)/(42-66) 111/42 (09/08 0557) SpO2:  [90 %-100 %] 95 % (09/08 0557) Weight:  [206 lb 6.4 oz (93.6 kg)] 206 lb 6.4 oz (93.6 kg) (09/08 0557)   Current Weight  07/22/16 206 lb 6.4 oz (93.6 kg)      Intake/Output from previous day: 09/07 0701 - 09/08 0700 In: 441.3 [P.O.:360; I.V.:81.3] Out: 751 [Urine:750; Stool:1]   Physical Exam:  Cardiovascular: IRRR, systolic murmur Pulmonary: Mostly clear to auscultation bilaterally Abdomen: Soft, non tender, bowel sounds present. Extremities: Trace bilateral lower extremity edema. Wounds: Right groin wound dressing is clean and dry.   Lab Results: CBC: Recent Labs  07/21/16 0440 07/22/16 0500  WBC 5.8 9.1  HGB 10.1* 10.2*  HCT 31.1* 30.5*  PLT 123* 153   BMET:  Recent Labs  07/21/16 0440 07/22/16 0500  NA 136 134*  K 4.0 3.9  CL 103 100*  CO2 27 26  GLUCOSE 146* 148*  BUN 7 10  CREATININE 1.04 1.03  CALCIUM 8.4* 8.3*    PT/INR:  Lab Results  Component Value Date   INR 1.20 07/22/2016   INR 1.19 07/19/2016   INR 1.53 06/30/2016   ABG:  INR: Will add last result for INR, ABG once components are confirmed Will add last 4 CBG results once components are confirmed  Assessment/Plan:  1. CV - Chronic, persistent AF (had WCT last week but NONE since TAVR). Patient declined enrollment in TAVI-AF. Started on  Coumadin last night. INR 1.2 this am. On Milrinone drip,Amiodarone 200 mg daily and Spironolactone. Co Ox 65.4 this am-Milrinone wean per heart failure 2.  Pulmonary - On room air. 3. Chronic systolic and diastolic CHF-On Lasix 20 mg daily 4.  Anemia -  H and H stable at 10.2 and 30.5 5. Cerebrovascular disease-s/p RICA stent 08/28 6. Thrombocytopenia resolved-platelets up to 153,000 7. Supplement potassium 8. DM-CBGs 159/118/141. On Insulin. Will restart Metformin in am 9. Regarding complaint of pain with deep breathing, last CXR stable. Will discuss with Dr. Eddie Candle MPA-C 07/22/2016,7:21 AM  I have seen and examined the patient and agree with the assessment and plan as outlined.  Looks good.  Mild chest discomfort w/ inspiration but no SOB and good O2 sats - will get f/u CXR in am.  Restarting coumadin.  May need lower dose now that he's on amiodarone, although I would favor stopping amiodarone.  He has severe mitral regurgitation and has been in Afib for years.  I don't think an attempt at [place] should be considered even after 6 weeks of amiodarone.     I spent in excess of 15 minutes during the conduct of this hospital encounter and >50% of this time involved direct face-to-face encounter with the patient for counseling and/or coordination of their care.    Rexene Alberts, MD 07/22/2016  1:35 PM   Addendum:  Every EKG in EPIC back to 2012 documents persistent Afib.  Last EKG with NSR was 2004.  Will stop amiodarone.  Rexene Alberts, MD 07/22/2016 6:28 PM

## 2016-07-22 NOTE — Progress Notes (Signed)
ANTICOAGULATION CONSULT NOTE - Follow Up Consult  Pharmacy Consult for warfarin Indication: atrial fibrillation   Assessment: S/p admission for cardiogenic shock now resolved.  S/P carotid stent last week and s/p TAVR 9/5.  Hx Afib back on warfarin now.   INR 1.2 this am. CVTS has ordered warfarin 2.5mg  daily. No bleeding issues noted. CBC stable.   Goal of Therapy:  INR 2-3 Monitor platelets by anticoagulation protocol: Yes   Plan:  Warfarin 2.5mg  daily - order wrote by CVTS this am Daily INR Monitor s/s bleeding Pharmacy to follow along   Patient Measurements: Height: 6\' 1"  (185.4 cm) Weight: 206 lb 6.4 oz (93.6 kg) IBW/kg (Calculated) : 79.9   Vital Signs: Temp: 98.4 F (36.9 C) (09/08 0557) Temp Source: Oral (09/08 0557) BP: 111/42 (09/08 0557) Pulse Rate: 69 (09/08 0557)  Labs:  Recent Labs  07/19/16 1044  07/20/16 0355 07/20/16 1841 07/21/16 0440 07/22/16 0500  HGB 10.2*  < > 10.1*  --  10.1* 10.2*  HCT 30.5*  < > 30.3*  --  31.1* 30.5*  PLT 122*  < > 146*  --  123* 153  APTT 44*  --   --   --   --   --   LABPROT 15.2  --   --   --   --  15.3*  INR 1.19  --   --   --   --  1.20  CREATININE  --   < > 1.06 1.20 1.04 1.03  < > = values in this interval not displayed.  Estimated Creatinine Clearance: 62.5 mL/min (by C-G formula based on SCr of 1.03 mg/dL).  Erin Hearing PharmD., BCPS Clinical Pharmacist Pager [phone] 07/22/2016 8:53 AM

## 2016-07-22 NOTE — Progress Notes (Signed)
CARDIAC REHAB PHASE I   PRE:  Rate/Rhythm: 68 afib  BP:  Supine:   Sitting: 109/48  Standing:    SaO2: 99%RA  MODE:  Ambulation: 350 ft   POST:  Rate/Rhythm: 110 afib  BP:  Supine:   Sitting: 124/71  Standing:    SaO2: 99%RA 1242-1302 Pt walked 350 ft on RA with gait belt use, rolling walker and asst x 2 with fairly steady gait. Tolerated well. To recliner after walk. Niece in room. Slightly SOB.   Graylon Good, RN BSN  07/22/2016 12:58 PM

## 2016-07-22 NOTE — Clinical Social Work Note (Signed)
[facility] has a bed available for patient once medically stable. CSW continuing to follow for support and discharge needs.  [employer], LCSW [phone]

## 2016-07-22 NOTE — Care Management Note (Signed)
Case Management Note Marvetta Gibbons RN, BSN Unit 2W-Case Manager [phone]  Patient Details  Name: [name] [name] MRN: [id number] Date of Birth: 03-27-1932  Subjective/Objective:  Pt s/p TAVR tx from 2S on 9/7- pt remains on IV milrinone gtt                  Action/Plan: PTA pt lived at home with wife- - PCP is Dr. Mickie Hillier,   Pt has been active with [facility] in the past- per PT evals recommending STSNF- however pt may refuse- if returns home pt will need HH orders for HHRN/PT, pt has cane, RW, and w/c at home.  CM to continue to follow for d/c needs.   Expected Discharge Date:                  Expected Discharge Plan:  [place]  In-House Referral:  NA  Discharge planning Services  CM Consult  Post Acute Care Choice:  Home Health Choice offered to:  Patient  DME Arranged:    DME Agency:     HH Arranged:    [place] Agency:  [facility]  Status of Service:  In process, will continue to follow  If discussed at Long Length of Stay Meetings, dates discussed:    Additional Comments:  Dawayne Patricia, RN 07/22/2016, 10:48 AM

## 2016-07-22 NOTE — Discharge Summary (Signed)
Physician Discharge Summary       [address]       [place] [address]             [phone]    Patient ID: [name] [name] MRN: [id number] DOB/AGE: Aug 16, 1932 82 y.o.  Admit date: 06/28/2016 Discharge date: 07/23/2016  Admission Diagnoses: 1. Severe aortic stenosis 2. Right cerebrovascular disease 3. Atrial fibrillation ([place]) 4. Mitral regurgitation  Acive Diagnoses:  1. Hypertension 2. Hyperlipidemia 3. Diabetes mellitus ([place]) 4. Anemia of chronic disease 5. Acute on chronic combined systolic and diastolic CHF, NYHA class 4 ([place]) 6. CAD (coronary artery disease), native coronary artery 7. Cardiogenic shock ([place]) 8. Asthma 9. CKD (chronic kidney disease), stage II 10. Acute respiratory failure with hypoxia (HCC) 11. VT (ventricular tachycardia) ([place]) 12. Gastroesophageal reflux disease 13. Temporal arteritis ([place]) 14. Hepatic steatosis 15. Urinary retention 16. Hematuria 17. Peripheral vascular disease ([place])  Consults: urology, vascular surgery and heart failure, cardiology, Otolaryngology  Procedure (s):   Transcatheter Aortic Valve Replacement - Percutaneous Left Transfemoral Approach                       Edwards Sapien 3 THV (size 29 mm, model # 9600TFX, serial # [id number]) by Dr. Roxy Manns on 09/052017.  History of Presenting Illness: Patient is an 82 year old male with long-standing history of aortic stenosis, chronic atrial fibrillation, hypertension, type II diabetes mellitus w/ complications, hyperlipidemia, and cerebrovascular disease who has recently been diagnosed with severe low gradient low ejection fraction aortic stenosis, severe mitral regurgitation, and acute on chronic systolic congestive heart failure with cardiogenic shock who has been referred for surgical consultation to discuss treatment options. The patient has a long history of aortic stenosis and chronic atrial fibrillation on long-term anticoagulation using warfarin. In the past  the patient had been followed by Dr. Lattie Haw.  He was last seen by Bunnie Domino and Dr. Izell  office on 04/27/2012.  Echocardiogram performed 04/12/2012 revealed moderate aortic stenosis with mean transvalvular gradient estimated 18 mmHg, aortic valve area calculated 1.16 cm, and left ventricular ejection fraction estimated 50-55%. The patient states that over the past 4-5 years he has experienced slow gradual decline in exercise tolerance with progressive exertional shortness of breath and fatigue. He still remained reasonably active physically until January of this year. Over the past 6 months the patient has experienced precipitous decline in his exercise tolerance with severe exertional shortness of breath, intermittent dizzy spells with near syncopal episodes, intermittent resting shortness of breath, lower extremity edema, and a tendency to simply "give out" with any significant activity. He was seen in follow-up by his primary care physician and referred for cardiology evaluation approximately one month ago.  Follow-up echocardiogram performed 06/01/2016 revealed severe low gradient low ejection fraction aortic stenosis. Peak velocity across the aortic valve was reported 2.6 m/s corresponding to mean transvalvular gradient estimated 17 mmHg.  The dimensionless velocity ratio was measured 0.16.  Left ventricular ejection fraction was estimated 35-40% with diffuse global hypokinesis. There was moderate to severe mitral regurgitation, severe left atrial enlargement, mild to moderate tricuspid regurgitation, and findings suggestive of moderate pulmonary hypertension.  The patient underwent outpatient transesophageal echocardiogram and left and right heart catheterization on 06/17/2016. TEE revealed moderate to severe left ventricular systolic dysfunction with ejection fraction estimated 30-35%. There was severe aortic stenosis with mild aortic insufficiency. The dimensionless velocity ratio was  measured 0.2 with peak velocity across the aortic valve measured 3.2 m/s corresponding to mean  transvalvular gradient estimated 22 mmHg. There was moderate to severe mitral regurgitation with the jet of regurgitation directed eccentrically and posteriorly.  PISA was felt to be possibly inaccurate but ERO measured 0.2 cm corresponding to regurgitant volume estimated 38 mL.  Diagnostic cardiac catheterization revealed moderate coronary artery disease with 75% stenosis of the mid left anterior descending coronary artery, 80% stenosis of the distal left circumflex coronary artery, and otherwise moderate nonobstructive disease. Attempts to cross the aortic valve were unsuccessful at catheterization.  Right heart catheterization revealed moderate pulmonary hypertension with PA pressures measured 52/23, mean pulmonary A wedge pressure measured 25 mmHg, central venous pressure 11 mmHg, and low cardiac output (3.2 L/m) and mixed venous oxygen saturation (52%).  The patient was seen in follow-up by Dr. Harl Bowie and his dose of beta blocker was decreased and dose of Lasix increased. Outpatient carotid duplex scan revealed severe right internal carotid artery stenosis.  He was referred for surgical consultation but prior to his office visit the patient presented acutely to the emergency department 06/28/2016 with further progression of symptoms of shortness of breath and orthopnea. He was admitted to [facility] under the care and direction of Dr. Haroldine Laws and the advanced heart failure team. Cardiothoracic surgical consultation was requested.  The patient is married and lives in [place] with his wife. 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 He has remained reasonably active physically throughout his retirement until recently. He describes a gradual progressive decline in his exercise tolerance over the last 4-5 years. He still was doing fairly well  until January of this year when he began to go downhill more rapidly. He has lost at least 15-20 pounds in weight. His appetite is been marginal. He is developed progressive exertional shortness of breath with intermittent resting shortness of breath, PND, orthopnea, and lower extremity edema. He has had occasional dizzy spells with several near syncopal episodes associated with false. He has not had any chest pain or chest tightness with activity or at rest. He also has been experiencing occasional episodes of transient monocular blindness and blurry vision that affect only the left eye. He has not had any transient numbness or weakness involving either upper or lower extremity. His mobility is limited primarily by exertional shortness of breath and generalized weakness. He does have some degenerative arthritis involving his lumbar spine and knees. He walks using a cane or walker for assistance.  He has a nephew and niece who are very supportive, involved in his healthcare and personal matters, and at the bedside for consultation this evening.  After a thorough work up, gated CTA and CTA of the chest, abdomen, and pelvis showed severe aortic stenosis with anatomy suitable for TAVR. Patient was also found to have a right internal carotid artery stenosis of 80%. A vascular consult was obtained with Dr. Scot Dock. A right carotid stent was placed on 07/11/2016. He was put on aspirin only (he is allegic to Plavix and he was felt too high risk for Brilinta).  Patient then developed hematuria. Patient had foley in place. A urology consult was obtained. It was felt his hematuria was multifactorial and would improve in time. It was felt that his gross hematuria was secondary to urinary retention boosted by anticoagulants. He was put on Cipro and Flomax.   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 An otolaryngology consult was obtained. Blood clots are noted within the nasopharynx, likely secondary to  intermittent epistaxis/ drying effect of [place] oxygen. No suspicious mass or lesion is noted. No intervention is needed at this time. Consider switching the patient's oxygen from [place] to facemask.  Heart failure was following the patient and he was on a Milrinone drip. Co ox was checked daily. He had VT on 09/02 and was started on IV Amiodarone. He was also on Digoxin. He developed bradycardia and Digoxin was stopped.  Patient eventually optimized to undergo TAVR via left transfemoral approach and this was done on 07/19/2016.  Brief Hospital Course:  He remained afebrile and hemodynamically stable. Gordy Councilman, a line, chest tubes, and foley were removed early in the post operative course. He remained in a fib rate controlled and was on Amiodarone. He was weaned off  Neo synephrine drip. He remained on Milrinone drip and this was managed by heart failure. He was eventually weaned off this on 07/23/16. He was volume over loaded and diuresed. He had anemia. His last H and H was 10/30. He did not require a post op transfusion. He also had thrombocytopenia but this did resolve as his last platelet count was up to 153k. Follow up echo after surgery showed LVEF 30-35%, AV cusp separation ws severely reduced, there was severe stenosis, mild regurgitation, severe MR. He was weaned off the insulin drip.  Once he was tolerating a diet, low dose Metformin was restarted.  The patient's glucose remained well controlled. He refused to be enrolled in [employer] TAVI-AF trial. He was started on Coumadin. His PT and INR were monitored daily. His last INR was up to 1.18. The patient was felt surgically stable for transfer from the ICU to [facility] for further convalescence on 07/21/2016. He continues to progress with cardiac rehab. He initially required several liters of oxygen via [place] but was weaned to room air. He has been tolerating a diet and has had a bowel movement.  The patient is felt surgically stable for discharge today.   Latest  Vital Signs: Blood pressure (!) 121/51, pulse 65, temperature 98.2 F (36.8 C), temperature source Oral, resp. rate 20, height 6\' 1"  (1.854 m), weight 204 lb 12.9 oz (92.9 kg), SpO2 95 %.  Physical Exam: Cardiovascular: IRRR, systolic murmur Pulmonary: Mostly clear to auscultation bilaterally Abdomen: Soft, non tender, bowel sounds present. Extremities: Trace bilateral lower extremity edema. Wounds: Right groin wound dressing is clean and dry.   Discharge Condition: Stable and discharged to [facility]   Recent laboratory studies:  Lab Results  Component Value Date   WBC 9.1 07/22/2016   HGB 10.2 (L) 07/22/2016   HCT 30.5 (L) 07/22/2016   MCV 88.9 07/22/2016   PLT 153 07/22/2016   Lab Results  Component Value Date   NA 134 (L) 07/23/2016   K 4.1 07/23/2016   CL 101 07/23/2016   CO2 26 07/23/2016   CREATININE 1.03 07/23/2016   GLUCOSE 140 (H) 07/23/2016    Diagnostic Studies: Ct Angio Head W Or Wo Contrast  Result Date: 07/02/2016 CLINICAL DATA:  Carotid stenosis EXAM: CT ANGIOGRAPHY HEAD AND NECK TECHNIQUE: Multidetector CT imaging of the head and neck was performed using the standard protocol during bolus administration of intravenous contrast. Multiplanar CT image reconstructions and MIPs were obtained to evaluate the vascular anatomy. Carotid stenosis measurements (when applicable) are obtained utilizing NASCET criteria, using the distal internal carotid diameter as the denominator. CONTRAST:  50 mL Isovue 370 COMPARISON:  MRI brain 04/03/2012, head CT 10/01/2015. FINDINGS: CT HEAD Brain: No mass lesion, intraparenchymal hemorrhage  or extra-axial collection. No evidence of acute infarct. No midline shift or hydrocephalus. There is confluent periventricular hypoattenuation compatible with chronic microvascular disease. Left temporal encephalomalacia is unchanged. Old right cerebellar infarct. Ventricles and sulci are normal for patient age. Calvarium and skull base: The skull is  normal. Visualized extracranial soft tissues are normal. Paranasal sinuses: Normal Orbits: Normal CTA HEAD Intracranial internal carotid arteries: Mild atherosclerotic calcification at the cavernous and clinoid segments. Anterior cerebral arteries: Normal Middle cerebral arteries: Normal. Posterior communicating arteries: Present on the right. Absent on the left. Posterior cerebral arteries: Normal. Basilar artery: Normal. Vertebral arteries: Right-dominant. There is loss of the normal opacification of the left vertebral artery V4 segment distal to the origin of the PICA. Superior cerebellar arteries: Normal. Anterior inferior cerebellar arteries: Not clearly visualized. Posterior inferior cerebellar arteries: Normal. Venous sinuses: Normal Anatomic variants: None Delayed phase: Unremarkable CTA NECK Aortic arch: There is mild atherosclerotic calcification within the aortic arch and proximal great vessels. Normal 3 vessel configuration. Moderate narrowing of the proximal left common carotid artery secondary to atherosclerotic plaque. Both subclavian arteries are patent. Right carotid system: There is atherosclerotic plaque at the right carotid bifurcation resulting in near complete common greater than 90% stenosis. This stenosis covers only a short segment of the artery in the remainder of the right internal carotid artery is normal. Left carotid system: As above, moderate atherosclerotic narrowing of the proximal left common carotid artery. There is atherosclerotic plaquing calcification at the left carotid bifurcation resulting in stenosis of approximately 70%. The remainder of the course of the left internal carotid artery is normal. Vertebral arteries:There is atherosclerotic plaque at the origin of the right vertebral artery, but the origin remains clearly patent. The course and caliber of the right vertebral artery are normal without stenosis. There is atherosclerotic calcification of the origin of the left  vertebral artery without significant stenosis. The left vertebral artery is diminutive along its entire course. Skeleton: Multilevel facet arthrosis and osteophytosis. No lytic or blastic osseous lesions. Other neck: Numerous scattered subcentimeter mediastinal lymph nodes. Parotid and submandibular glands are normal. The larynx is normal. There is fullness within the left fossa of Rosenmuller. Unremarkable thyroid Upper chest: There are large bilateral pleural effusions and biapical emphysema. No nodules or masses. IMPRESSION: 1. Critical stenosis of the proximal right internal carotid artery, secondary to the presence of atherosclerotic plaque. 2. Moderate to severe stenosis of the proximal left internal carotid artery, measuring approximately 70%. 3. **An incidental finding of potential clinical significance has been found. There is fullness within the left fossa of Rosenmuller, which could indicate the presence of a nasopharyngeal mass. Recommend correlation with direct visualization.** 4. Severe narrowing of the distal V4 segment of the left vertebral artery, distal to the PICA origin. 5. No intracranial occlusion or advanced stenosis. 6. No acute intracranial abnormality. 7. Large bilateral pleural effusions. Electronically Signed   By: Ulyses Jarred M.D.   On: 07/02/2016 22:59   Ct Angio Neck W Or Wo Contrast  Result Date: 07/02/2016 CLINICAL DATA:  Carotid stenosis EXAM: CT ANGIOGRAPHY HEAD AND NECK TECHNIQUE: Multidetector CT imaging of the head and neck was performed using the standard protocol during bolus administration of intravenous contrast. Multiplanar CT image reconstructions and MIPs were obtained to evaluate the vascular anatomy. Carotid stenosis measurements (when applicable) are obtained utilizing NASCET criteria, using the distal internal carotid diameter as the denominator. CONTRAST:  50 mL Isovue 370 COMPARISON:  MRI brain 04/03/2012, head CT 10/01/2015. FINDINGS: CT HEAD Brain:  No mass  lesion, intraparenchymal hemorrhage or extra-axial collection. No evidence of acute infarct. No midline shift or hydrocephalus. There is confluent periventricular hypoattenuation compatible with chronic microvascular disease. Left temporal encephalomalacia is unchanged. Old right cerebellar infarct. Ventricles and sulci are normal for patient age. Calvarium and skull base: The skull is normal. Visualized extracranial soft tissues are normal. Paranasal sinuses: Normal Orbits: Normal CTA HEAD Intracranial internal carotid arteries: Mild atherosclerotic calcification at the cavernous and clinoid segments. Anterior cerebral arteries: Normal Middle cerebral arteries: Normal. Posterior communicating arteries: Present on the right. Absent on the left. Posterior cerebral arteries: Normal. Basilar artery: Normal. Vertebral arteries: Right-dominant. There is loss of the normal opacification of the left vertebral artery V4 segment distal to the origin of the PICA. Superior cerebellar arteries: Normal. Anterior inferior cerebellar arteries: Not clearly visualized. Posterior inferior cerebellar arteries: Normal. Venous sinuses: Normal Anatomic variants: None Delayed phase: Unremarkable CTA NECK Aortic arch: There is mild atherosclerotic calcification within the aortic arch and proximal great vessels. Normal 3 vessel configuration. Moderate narrowing of the proximal left common carotid artery secondary to atherosclerotic plaque. Both subclavian arteries are patent. Right carotid system: There is atherosclerotic plaque at the right carotid bifurcation resulting in near complete common greater than 90% stenosis. This stenosis covers only a short segment of the artery in the remainder of the right internal carotid artery is normal. Left carotid system: As above, moderate atherosclerotic narrowing of the proximal left common carotid artery. There is atherosclerotic plaquing calcification at the left carotid bifurcation resulting in  stenosis of approximately 70%. The remainder of the course of the left internal carotid artery is normal. Vertebral arteries:There is atherosclerotic plaque at the origin of the right vertebral artery, but the origin remains clearly patent. The course and caliber of the right vertebral artery are normal without stenosis. There is atherosclerotic calcification of the origin of the left vertebral artery without significant stenosis. The left vertebral artery is diminutive along its entire course. Skeleton: Multilevel facet arthrosis and osteophytosis. No lytic or blastic osseous lesions. Other neck: Numerous scattered subcentimeter mediastinal lymph nodes. Parotid and submandibular glands are normal. The larynx is normal. There is fullness within the left fossa of Rosenmuller. Unremarkable thyroid Upper chest: There are large bilateral pleural effusions and biapical emphysema. No nodules or masses. IMPRESSION: 1. Critical stenosis of the proximal right internal carotid artery, secondary to the presence of atherosclerotic plaque. 2. Moderate to severe stenosis of the proximal left internal carotid artery, measuring approximately 70%. 3. **An incidental finding of potential clinical significance has been found. There is fullness within the left fossa of Rosenmuller, which could indicate the presence of a nasopharyngeal mass. Recommend correlation with direct visualization.** 4. Severe narrowing of the distal V4 segment of the left vertebral artery, distal to the PICA origin. 5. No intracranial occlusion or advanced stenosis. 6. No acute intracranial abnormality. 7. Large bilateral pleural effusions. Electronically Signed   By: Ulyses Jarred M.D.   On: 07/02/2016 22:59   Ct Cardiac Morph/pulm Vein W/cm&w/o Ca Score  Addendum Date: 07/07/2016   ADDENDUM REPORT: 07/07/2016 09:31 EXAM: OVER-READ INTERPRETATION  CT CHEST The following report is an over-read performed by radiologist Dr. Barnetta Hammersmith [facility] on 07/07/2016. This over-read does not include interpretation of cardiac or coronary anatomy or pathology. The coronary CTA/TAVR CTA interpretation by the cardiologist is attached. COMPARISON:  None. FINDINGS: Interstitial edema present with moderate sized right pleural effusion and tiny left pleural effusion. No incidental pulmonary nodules or enlarged  lymph nodes identified. Atherosclerosis of the aortic arch and descending thoracic aorta present without evidence of aneurysmal disease. Proximal great vessels show normal branching anatomy and no significant obstructive disease. Visualized bony structures are unremarkable. Visualized upper abdominal structures are unremarkable. IMPRESSION: Interstitial pulmonary edema with moderate right pleural effusion and small left pleural effusion. Electronically Signed   By: Aletta Edouard M.D.   On: 07/07/2016 09:31   Result Date: 07/07/2016 CLINICAL DATA:  28-yaer-old male with severe aortic stenosis. EXAM: Cardiac TAVR CT TECHNIQUE: The patient was scanned on a Philips 256 scanner. A 120 kV retrospective scan was triggered in the descending thoracic aorta at 111 HU's. Gantry rotation speed was 270 msecs and collimation was .9 mm. 10 mg of iv Metoprolol and no nitro were given. The 3D data set was reconstructed in 5% intervals of the R-R cycle. Systolic and diastolic phases were analyzed on a dedicated work station using MPR, MIP and VRT modes. The patient received 80 cc of contrast. FINDINGS: Aortic Valve: Trileaflet, severely thickened and calcified with severely restricted leaflet opening. There are only mild calcifications extending into the LVOT. Aorta: Normal caliber, moderate diffuse calcifications and severe atherosclerosis on the aortic arch and descending thoracic aorta. No dissection. Sinotubular Junction:  27 x 27 mm Ascending Thoracic Aorta:  32 x 30 mm Aortic Arch:  27 x 27 mm Descending Thoracic Aorta:  28 x 27 mm Sinus of Valsalva  Measurements: Non-coronary:  33 mm Right -coronary:  33 mm Left -coronary:  32 mm Coronary Artery Height above Annulus: Left Main:  13 mm Right Coronary:  14 mm Virtual Basal Annulus Measurements: Maximum/Minimum Diameter:  29 x 24 mm Perimeter:  92 mm Area:  574 mm2 Optimum Fluoroscopic Angle for Delivery:  RAO 6 CRA 4 Other findings: There is a very large left atrial appendage with no evidence of a thrombus. Normal pulmonary vein drainage into the left atrium. Dilated pulmonary artery measuring 32 x 29 mm suggestive of pulmonary hypertension. IMPRESSION: 1. Trileaflet, severely thickened and calcified with severely restricted leaflet opening and annular measurements suitable for delivery of 29 mm Edward-SAPIEN TAVR valve. 2. Sufficient coronaries to annulus distance. 3. Optimum Fluoroscopic Angle for Delivery:  RAO 6 CRA 4 Ena Dawley Electronically Signed: By: Ena Dawley On: 07/05/2016 19:58   Dg Chest Port 1 View  Result Date: 07/20/2016 CLINICAL DATA:  Chest pain and shortness of breath EXAM: PORTABLE CHEST 1 VIEW COMPARISON:  July 19, 2016 FINDINGS: Swan-Ganz catheter tip is in the proximal right intralobar pulmonary artery. Right subclavian catheter tip is at the cavoatrial junction. No pneumothorax. There has been significant partial clearing of interstitial edema. Only a slight amount of interstitial edema remains. There is no airspace consolidation. Heart is borderline enlarged with mild pulmonary venous hypertension. Prosthetic valve unchanged in position. There is aortic atherosclerosis. IMPRESSION: Significant clearing of interstitial edema with only mild interstitial edema remaining. No new opacity. There remains a degree of pulmonary venous hypertension. Catheter positions are unchanged without pneumothorax. Electronically Signed   By: Lowella Grip III M.D.   On: 07/20/2016 07:40    Ct Angio Chest Aorta W/cm &/or Wo/cm  Result Date: 07/11/2016 CLINICAL DATA:  82 year old  male with history of severe aortic stenosis. Preprocedural study prior to potential transcatheter aortic valve replacement (TAVR) procedure. EXAM: CT ANGIOGRAPHY CHEST, ABDOMEN AND PELVIS TECHNIQUE: Multidetector CT imaging through the chest, abdomen and pelvis was performed using the standard protocol during bolus administration of intravenous contrast. Multiplanar reconstructed images and MIPs  were obtained and reviewed to evaluate the vascular anatomy. CONTRAST:  65 mL of Isovue 370. COMPARISON:  CT the abdomen and pelvis 06/24/2010. FINDINGS: CTA CHEST FINDINGS Cardiovascular: Heart size is mildly enlarged with left atrial dilatation. There is no significant pericardial fluid, thickening or pericardial calcification. There is aortic atherosclerosis, as well as atherosclerosis of the great vessels of the mediastinum and the coronary arteries, including calcified atherosclerotic plaque in the left main, left anterior descending, left circumflex and right coronary arteries. Severe thickening calcifications of the aortic valve. Right upper extremity PICC with tip terminating at the superior cavoatrial junction. Mediastinum/Nodes: No pathologically enlarged mediastinal or hilar lymph nodes. Esophagus is unremarkable in appearance. No axillary lymphadenopathy. Lungs/Pleura: Moderate to large right and trace left pleural effusions lie dependently. Significant regions of passive subsegmental atelectasis are noted in the lower lobes of the lungs bilaterally (right greater than left). No acute consolidative airspace disease. No pleural effusions. There are several small pulmonary nodules in the lungs bilaterally. The largest solid-appearing pulmonary nodule is a 4 mm subpleural nodule associated with the major fissure in the anterior aspect of the superior segment of the right lower lobe (image 46 of series 407). There is also a 1.6 x 1.7 cm ground-glass attenuation nodule with tiny 5 mm central solid component in the  apex of the right upper lobe (image 26 of series 407 and image 33 of series 401). Mild centrilobular and paraseptal emphysema noted predominantly in the lung apices. Musculoskeletal: Well-defined sclerotic lesion in the posterior aspect of the right T8 rib is similar to the prior study from 06/24/2010, presumably a bone island. There are no aggressive appearing lytic or blastic lesions noted in the visualized portions of the skeleton. CTA ABDOMEN AND PELVIS FINDINGS Hepatobiliary: The liver has a shrunken appearance and slightly nodular contour, compatible with early changes of cirrhosis. No discrete cystic or solid hepatic lesions are noted. No intra or extrahepatic biliary ductal dilatation. Gallbladder is normal in appearance. Pancreas: No pancreatic mass. No pancreatic ductal dilatation. No pancreatic or peripancreatic fluid or inflammatory changes. Spleen: Unremarkable. Adrenals/Urinary Tract: Bilateral adrenal glands are normal in appearance. Exophytic 3.5 cm low-attenuation lesion in the upper pole the left kidney is only slightly larger than the prior examination from 06/24/2010, compatible with a simple cyst. Other sub cm low-attenuation lesions in the kidneys bilaterally are too small to definitively characterize, but also favored to represent tiny cysts. There is a 2.3 cm intermediate attenuation (26-34 HU) lesion in the lateral aspect of the right kidney at the junction of the interpolar and lower pole regions which is indeterminate, but new compared to the prior study. No hydroureteronephrosis. Urinary bladder is generally unremarkable in appearance, although there is a small amount of gas non dependently in the bladder, presumably related to the indwelling Foley catheter. Stomach/Bowel: The appearance of the stomach is normal. There is no pathologic dilatation of small bowel or colon. Numerous colonic diverticulae are noted, particularly in the proximal sigmoid colon, without surrounding inflammatory  changes to suggest an acute diverticulitis at this time. Normal appendix. Vascular/Lymphatic: Extensive atherosclerosis is noted throughout the abdominal and pelvic vasculature, with vascular findings and measurements pertinent to potential TAVR procedure, as detailed below. No aneurysm or dissection is noted in the abdominal or pelvic vasculature. Single left renal artery. Tiny accessory renal artery to the lower pole of the right kidney noted. Renal arteries are patent bilaterally. Celiac axis and major branches appear widely patent. Mild stenosis in the proximal superior mesenteric artery, which  is otherwise widely patent. Inferior mesenteric artery appears widely patent. Reproductive: Prostate gland and seminal vesicles are unremarkable in appearance. Other: No significant volume of ascites.  No pneumoperitoneum. Musculoskeletal: Chronic appearing compression fracture at L2 with approximately 40% loss of central vertebral body height, only slightly increased compared to prior examinations. There are no aggressive appearing lytic or blastic lesions noted in the visualized portions of the skeleton. VASCULAR MEASUREMENTS PERTINENT TO TAVR: AORTA: Minimal Aortic Diameter -  17 x 12 mm Severity of Aortic Calcification -  moderate to severe RIGHT PELVIS: Right Common Iliac Artery - Minimal Diameter - 9.2 x 9.2 mm Tortuosity - moderate Calcification - mild Right External Iliac Artery - Minimal Diameter - 6.9 x 6.5 mm Tortuosity - moderate to severe Calcification - none Right Common Femoral Artery - Minimal Diameter - 7.3 x 4.9 mm Tortuosity - mild Calcification - moderate LEFT PELVIS: Left Common Iliac Artery - Minimal Diameter - 9.7 x 9.7 mm Tortuosity - minimal Calcification - moderate Left External Iliac Artery - Minimal Diameter - 6.8 x 6.0 mm Tortuosity - mild to moderate Calcification - none Left Common Femoral Artery - Minimal Diameter - 7.6 x 5.4 mm Tortuosity - mild Calcification - mild Review of the MIP  images confirms the above findings. IMPRESSION: 1. Vascular findings and measurements pertinent to potential TAVR procedure, as detailed above. The patient does appear to have suitable pelvic arterial access, particularly on the left side. 2. Thickened and heavily calcified aortic valve, compatible with the reported clinical history of severe aortic stenosis. 3. Cardiomegaly with left atrial dilatation. 4. Aortic atherosclerosis, in addition to left main and 3 vessel coronary artery disease. 5. **An incidental finding of potential clinical significance has been found. 2.3 cm intermediate attenuation lesion at the junction of the lower pole and interpolar region of the right kidney is new compared to the prior study, and considered indeterminate. Further characterization with nonemergent MRI of the abdomen with and without IV gadolinium is recommended in the near future to provide definitive characterization of this lesion.** 6. Morphologic changes in the liver suggestive of early cirrhosis. 7. Extensive colonic diverticulosis without evidence to suggest an acute diverticulitis at this time. 8. Additional incidental findings, as above. Electronically Signed   By: Vinnie Langton M.D.   On: 07/11/2016 16:08   Ct Angio Abd/pel W/ And/or W/o  Result Date: 07/11/2016 CLINICAL DATA:  82 year old male with history of severe aortic stenosis. Preprocedural study prior to potential transcatheter aortic valve replacement (TAVR) procedure. EXAM: CT ANGIOGRAPHY CHEST, ABDOMEN AND PELVIS TECHNIQUE: Multidetector CT imaging through the chest, abdomen and pelvis was performed using the standard protocol during bolus administration of intravenous contrast. Multiplanar reconstructed images and MIPs were obtained and reviewed to evaluate the vascular anatomy. CONTRAST:  65 mL of Isovue 370. COMPARISON:  CT the abdomen and pelvis 06/24/2010. FINDINGS: CTA CHEST FINDINGS Cardiovascular: Heart size is mildly enlarged with left  atrial dilatation. There is no significant pericardial fluid, thickening or pericardial calcification. There is aortic atherosclerosis, as well as atherosclerosis of the great vessels of the mediastinum and the coronary arteries, including calcified atherosclerotic plaque in the left main, left anterior descending, left circumflex and right coronary arteries. Severe thickening calcifications of the aortic valve. Right upper extremity PICC with tip terminating at the superior cavoatrial junction. Mediastinum/Nodes: No pathologically enlarged mediastinal or hilar lymph nodes. Esophagus is unremarkable in appearance. No axillary lymphadenopathy. Lungs/Pleura: Moderate to large right and trace left pleural effusions lie dependently. Significant regions of passive  subsegmental atelectasis are noted in the lower lobes of the lungs bilaterally (right greater than left). No acute consolidative airspace disease. No pleural effusions. There are several small pulmonary nodules in the lungs bilaterally. The largest solid-appearing pulmonary nodule is a 4 mm subpleural nodule associated with the major fissure in the anterior aspect of the superior segment of the right lower lobe (image 46 of series 407). There is also a 1.6 x 1.7 cm ground-glass attenuation nodule with tiny 5 mm central solid component in the apex of the right upper lobe (image 26 of series 407 and image 33 of series 401). Mild centrilobular and paraseptal emphysema noted predominantly in the lung apices. Musculoskeletal: Well-defined sclerotic lesion in the posterior aspect of the right T8 rib is similar to the prior study from 06/24/2010, presumably a bone island. There are no aggressive appearing lytic or blastic lesions noted in the visualized portions of the skeleton. CTA ABDOMEN AND PELVIS FINDINGS Hepatobiliary: The liver has a shrunken appearance and slightly nodular contour, compatible with early changes of cirrhosis. No discrete cystic or solid hepatic  lesions are noted. No intra or extrahepatic biliary ductal dilatation. Gallbladder is normal in appearance. Pancreas: No pancreatic mass. No pancreatic ductal dilatation. No pancreatic or peripancreatic fluid or inflammatory changes. Spleen: Unremarkable. Adrenals/Urinary Tract: Bilateral adrenal glands are normal in appearance. Exophytic 3.5 cm low-attenuation lesion in the upper pole the left kidney is only slightly larger than the prior examination from 06/24/2010, compatible with a simple cyst. Other sub cm low-attenuation lesions in the kidneys bilaterally are too small to definitively characterize, but also favored to represent tiny cysts. There is a 2.3 cm intermediate attenuation (26-34 HU) lesion in the lateral aspect of the right kidney at the junction of the interpolar and lower pole regions which is indeterminate, but new compared to the prior study. No hydroureteronephrosis. Urinary bladder is generally unremarkable in appearance, although there is a small amount of gas non dependently in the bladder, presumably related to the indwelling Foley catheter. Stomach/Bowel: The appearance of the stomach is normal. There is no pathologic dilatation of small bowel or colon. Numerous colonic diverticulae are noted, particularly in the proximal sigmoid colon, without surrounding inflammatory changes to suggest an acute diverticulitis at this time. Normal appendix. Vascular/Lymphatic: Extensive atherosclerosis is noted throughout the abdominal and pelvic vasculature, with vascular findings and measurements pertinent to potential TAVR procedure, as detailed below. No aneurysm or dissection is noted in the abdominal or pelvic vasculature. Single left renal artery. Tiny accessory renal artery to the lower pole of the right kidney noted. Renal arteries are patent bilaterally. Celiac axis and major branches appear widely patent. Mild stenosis in the proximal superior mesenteric artery, which is otherwise widely patent.  Inferior mesenteric artery appears widely patent. Reproductive: Prostate gland and seminal vesicles are unremarkable in appearance. Other: No significant volume of ascites.  No pneumoperitoneum. Musculoskeletal: Chronic appearing compression fracture at L2 with approximately 40% loss of central vertebral body height, only slightly increased compared to prior examinations. There are no aggressive appearing lytic or blastic lesions noted in the visualized portions of the skeleton. VASCULAR MEASUREMENTS PERTINENT TO TAVR: AORTA: Minimal Aortic Diameter -  17 x 12 mm Severity of Aortic Calcification -  moderate to severe RIGHT PELVIS: Right Common Iliac Artery - Minimal Diameter - 9.2 x 9.2 mm Tortuosity - moderate Calcification - mild Right External Iliac Artery - Minimal Diameter - 6.9 x 6.5 mm Tortuosity - moderate to severe Calcification - none Right Common Femoral  Artery - Minimal Diameter - 7.3 x 4.9 mm Tortuosity - mild Calcification - moderate LEFT PELVIS: Left Common Iliac Artery - Minimal Diameter - 9.7 x 9.7 mm Tortuosity - minimal Calcification - moderate Left External Iliac Artery - Minimal Diameter - 6.8 x 6.0 mm Tortuosity - mild to moderate Calcification - none Left Common Femoral Artery - Minimal Diameter - 7.6 x 5.4 mm Tortuosity - mild Calcification - mild Review of the MIP images confirms the above findings. IMPRESSION: 1. Vascular findings and measurements pertinent to potential TAVR procedure, as detailed above. The patient does appear to have suitable pelvic arterial access, particularly on the left side. 2. Thickened and heavily calcified aortic valve, compatible with the reported clinical history of severe aortic stenosis. 3. Cardiomegaly with left atrial dilatation. 4. Aortic atherosclerosis, in addition to left main and 3 vessel coronary artery disease. 5. **An incidental finding of potential clinical significance has been found. 2.3 cm intermediate attenuation lesion at the junction of the  lower pole and interpolar region of the right kidney is new compared to the prior study, and considered indeterminate. Further characterization with nonemergent MRI of the abdomen with and without IV gadolinium is recommended in the near future to provide definitive characterization of this lesion.** 6. Morphologic changes in the liver suggestive of early cirrhosis. 7. Extensive colonic diverticulosis without evidence to suggest an acute diverticulitis at this time. 8. Additional incidental findings, as above. Electronically Signed   By: Vinnie Langton M.D.   On: 07/11/2016 16:08    Discharge Medications:   Medication List    STOP taking these medications   albuterol 108 (90 Base) MCG/ACT inhaler Commonly known as:  PROVENTIL HFA;VENTOLIN HFA   metoprolol succinate 25 MG 24 hr tablet Commonly known as:  TOPROL-XL     TAKE these medications   aspirin 81 MG chewable tablet Chew 1 tablet (81 mg total) by mouth daily.   bisoprolol 5 MG tablet Commonly known as:  ZEBETA Take 0.5 tablets (2.5 mg total) by mouth daily.   furosemide 20 MG tablet Commonly known as:  LASIX Take 1 tablet (20 mg total) by mouth daily. What changed:  how much to take   HYDROcodone-acetaminophen 5-325 MG tablet Commonly known as:  NORCO/VICODIN Take 1 tablet by mouth every 6 (six) hours as needed for moderate pain. What changed:  when to take this   losartan 25 MG tablet Commonly known as:  COZAAR Take 0.5 tablets (12.5 mg total) by mouth daily.   metFORMIN 500 MG tablet Commonly known as:  GLUCOPHAGE 1/2 tablet twice a day   nortriptyline 10 MG capsule Commonly known as:  PAMELOR TAKE 1 TO 2 CAPSULES AT BEDTIME FOR BURNING IN FEET.   rosuvastatin 5 MG tablet Commonly known as:  CRESTOR Take 1 tablet (5 mg total) by mouth daily.   spironolactone 25 MG tablet Commonly known as:  ALDACTONE Take 0.5 tablets (12.5 mg total) by mouth daily.   traZODone 100 MG tablet Commonly known as:   DESYREL Take 1 tablet (100 mg total) by mouth at bedtime.   warfarin 5 MG tablet Commonly known as:  COUMADIN Take 1 tablet (5 mg total) by mouth daily at 6 PM. What changed:  when to take this  additional instructions       Follow Up Appointments: Follow-up Information    Sherren Mocha, MD .   Specialty:  Cardiology Why:  Echo to be taken at 3:00 pm; Appointment 4:00 pm Contact information: [id number]  [address] [place] [address] [phone]        [name], MD Follow up on 08/09/2016.   Specialty:  Cardiology Why:  at 1:20 [place] information: [place] [address] [phone]          1. Please obtain vital signs at least one time daily 2.Please weigh the patient daily. If he or she continues to gain weight or develops lower extremity edema, contact the office at [phone]. 3. Ambulate patient at least three times daily and please use sternal precautions. 4 Check PT/INR mid week to adjust coumadin dose    Signed: GOLD,WAYNE EPA-C 07/23/2016, 11:21 AM

## 2016-07-23 ENCOUNTER — Non-Acute Institutional Stay (SKILLED_NURSING_FACILITY): Payer: Medicare Other | Admitting: Internal Medicine

## 2016-07-23 ENCOUNTER — Inpatient Hospital Stay (HOSPITAL_COMMUNITY): Payer: Medicare Other

## 2016-07-23 DIAGNOSIS — I679 Cerebrovascular disease, unspecified: Secondary | ICD-10-CM | POA: Diagnosis not present

## 2016-07-23 DIAGNOSIS — E118 Type 2 diabetes mellitus with unspecified complications: Secondary | ICD-10-CM | POA: Diagnosis not present

## 2016-07-23 DIAGNOSIS — I5043 Acute on chronic combined systolic (congestive) and diastolic (congestive) heart failure: Secondary | ICD-10-CM

## 2016-07-23 DIAGNOSIS — E1140 Type 2 diabetes mellitus with diabetic neuropathy, unspecified: Secondary | ICD-10-CM | POA: Diagnosis not present

## 2016-07-23 DIAGNOSIS — J9811 Atelectasis: Secondary | ICD-10-CM | POA: Diagnosis not present

## 2016-07-23 DIAGNOSIS — Z954 Presence of other heart-valve replacement: Secondary | ICD-10-CM

## 2016-07-23 DIAGNOSIS — I10 Essential (primary) hypertension: Secondary | ICD-10-CM | POA: Diagnosis not present

## 2016-07-23 DIAGNOSIS — E785 Hyperlipidemia, unspecified: Secondary | ICD-10-CM | POA: Diagnosis not present

## 2016-07-23 DIAGNOSIS — Z7901 Long term (current) use of anticoagulants: Secondary | ICD-10-CM | POA: Diagnosis not present

## 2016-07-23 DIAGNOSIS — D638 Anemia in other chronic diseases classified elsewhere: Secondary | ICD-10-CM | POA: Diagnosis not present

## 2016-07-23 DIAGNOSIS — M6281 Muscle weakness (generalized): Secondary | ICD-10-CM | POA: Diagnosis not present

## 2016-07-23 DIAGNOSIS — I6529 Occlusion and stenosis of unspecified carotid artery: Secondary | ICD-10-CM | POA: Diagnosis not present

## 2016-07-23 DIAGNOSIS — Z87891 Personal history of nicotine dependence: Secondary | ICD-10-CM | POA: Diagnosis not present

## 2016-07-23 DIAGNOSIS — L570 Actinic keratosis: Secondary | ICD-10-CM | POA: Diagnosis not present

## 2016-07-23 DIAGNOSIS — I480 Paroxysmal atrial fibrillation: Secondary | ICD-10-CM | POA: Diagnosis not present

## 2016-07-23 DIAGNOSIS — I130 Hypertensive heart and chronic kidney disease with heart failure and stage 1 through stage 4 chronic kidney disease, or unspecified chronic kidney disease: Secondary | ICD-10-CM | POA: Diagnosis not present

## 2016-07-23 DIAGNOSIS — R338 Other retention of urine: Secondary | ICD-10-CM | POA: Diagnosis not present

## 2016-07-23 DIAGNOSIS — I340 Nonrheumatic mitral (valve) insufficiency: Secondary | ICD-10-CM | POA: Diagnosis not present

## 2016-07-23 DIAGNOSIS — Z952 Presence of prosthetic heart valve: Secondary | ICD-10-CM | POA: Diagnosis not present

## 2016-07-23 DIAGNOSIS — I6521 Occlusion and stenosis of right carotid artery: Secondary | ICD-10-CM | POA: Diagnosis not present

## 2016-07-23 DIAGNOSIS — X32XXXD Exposure to sunlight, subsequent encounter: Secondary | ICD-10-CM | POA: Diagnosis not present

## 2016-07-23 DIAGNOSIS — J9601 Acute respiratory failure with hypoxia: Secondary | ICD-10-CM | POA: Diagnosis not present

## 2016-07-23 DIAGNOSIS — N401 Enlarged prostate with lower urinary tract symptoms: Secondary | ICD-10-CM | POA: Diagnosis not present

## 2016-07-23 DIAGNOSIS — I739 Peripheral vascular disease, unspecified: Secondary | ICD-10-CM | POA: Diagnosis not present

## 2016-07-23 DIAGNOSIS — E1142 Type 2 diabetes mellitus with diabetic polyneuropathy: Secondary | ICD-10-CM | POA: Diagnosis not present

## 2016-07-23 DIAGNOSIS — I776 Arteritis, unspecified: Secondary | ICD-10-CM | POA: Diagnosis not present

## 2016-07-23 DIAGNOSIS — Z7982 Long term (current) use of aspirin: Secondary | ICD-10-CM | POA: Diagnosis not present

## 2016-07-23 DIAGNOSIS — I5022 Chronic systolic (congestive) heart failure: Secondary | ICD-10-CM | POA: Diagnosis not present

## 2016-07-23 DIAGNOSIS — I352 Nonrheumatic aortic (valve) stenosis with insufficiency: Secondary | ICD-10-CM | POA: Diagnosis not present

## 2016-07-23 DIAGNOSIS — I482 Chronic atrial fibrillation: Secondary | ICD-10-CM | POA: Diagnosis not present

## 2016-07-23 DIAGNOSIS — Z48812 Encounter for surgical aftercare following surgery on the circulatory system: Secondary | ICD-10-CM | POA: Diagnosis not present

## 2016-07-23 DIAGNOSIS — E0842 Diabetes mellitus due to underlying condition with diabetic polyneuropathy: Secondary | ICD-10-CM | POA: Diagnosis not present

## 2016-07-23 DIAGNOSIS — K219 Gastro-esophageal reflux disease without esophagitis: Secondary | ICD-10-CM | POA: Diagnosis not present

## 2016-07-23 DIAGNOSIS — I5042 Chronic combined systolic (congestive) and diastolic (congestive) heart failure: Secondary | ICD-10-CM | POA: Diagnosis not present

## 2016-07-23 DIAGNOSIS — I350 Nonrheumatic aortic (valve) stenosis: Secondary | ICD-10-CM | POA: Diagnosis not present

## 2016-07-23 DIAGNOSIS — G8929 Other chronic pain: Secondary | ICD-10-CM | POA: Diagnosis not present

## 2016-07-23 DIAGNOSIS — K5900 Constipation, unspecified: Secondary | ICD-10-CM | POA: Diagnosis not present

## 2016-07-23 DIAGNOSIS — I2510 Atherosclerotic heart disease of native coronary artery without angina pectoris: Secondary | ICD-10-CM | POA: Diagnosis not present

## 2016-07-23 DIAGNOSIS — H6191 Disorder of right external ear, unspecified: Secondary | ICD-10-CM | POA: Diagnosis not present

## 2016-07-23 DIAGNOSIS — J45909 Unspecified asthma, uncomplicated: Secondary | ICD-10-CM | POA: Diagnosis not present

## 2016-07-23 DIAGNOSIS — Z7984 Long term (current) use of oral hypoglycemic drugs: Secondary | ICD-10-CM | POA: Diagnosis not present

## 2016-07-23 DIAGNOSIS — E119 Type 2 diabetes mellitus without complications: Secondary | ICD-10-CM | POA: Diagnosis not present

## 2016-07-23 DIAGNOSIS — N182 Chronic kidney disease, stage 2 (mild): Secondary | ICD-10-CM | POA: Diagnosis not present

## 2016-07-23 DIAGNOSIS — I509 Heart failure, unspecified: Secondary | ICD-10-CM | POA: Diagnosis not present

## 2016-07-23 DIAGNOSIS — Z79899 Other long term (current) drug therapy: Secondary | ICD-10-CM | POA: Diagnosis not present

## 2016-07-23 LAB — GLUCOSE, CAPILLARY
Glucose-Capillary: 130 mg/dL — ABNORMAL HIGH (ref 65–99)
Glucose-Capillary: 163 mg/dL — ABNORMAL HIGH (ref 65–99)

## 2016-07-23 LAB — BASIC METABOLIC PANEL WITH GFR
Anion gap: 7 (ref 5–15)
BUN: 11 mg/dL (ref 6–20)
CO2: 26 mmol/L (ref 22–32)
Calcium: 8.5 mg/dL — ABNORMAL LOW (ref 8.9–10.3)
Chloride: 101 mmol/L (ref 101–111)
Creatinine, Ser: 1.03 mg/dL (ref 0.61–1.24)
GFR calc Af Amer: >60 mL/min (ref >60)
GFR calc non Af Amer: >60 mL/min (ref >60)
Glucose, Bld: 140 mg/dL — ABNORMAL HIGH (ref 65–99)
Potassium: 4.1 mmol/L (ref 3.5–5.1)
Sodium: 134 mmol/L — ABNORMAL LOW (ref 135–145)

## 2016-07-23 LAB — CARBOXYHEMOGLOBIN
Carboxyhemoglobin: 1.9 % — ABNORMAL HIGH (ref 0.5–1.5)
Methemoglobin: 0.7 % (ref 0.0–1.5)
O2 Saturation: 61.8 %
Total hemoglobin: 10.7 g/dL — ABNORMAL LOW (ref 12.0–16.0)

## 2016-07-23 LAB — PROTIME-INR
INR: 1.18
Prothrombin Time: 15.1 s (ref 11.4–15.2)

## 2016-07-23 MED ORDER — ROSUVASTATIN CALCIUM 5 MG PO TABS: 5.0000 mg | ORAL_TABLET | Freq: Every day | ORAL | Status: DC

## 2016-07-23 MED ORDER — WARFARIN SODIUM 5 MG PO TABS: 5.0000 mg | ORAL_TABLET | Freq: Every day | ORAL | Status: DC

## 2016-07-23 MED ORDER — WARFARIN - PHYSICIAN DOSING INPATIENT: Freq: Every day | Status: DC

## 2016-07-23 MED ORDER — SPIRONOLACTONE 25 MG PO TABS: 12.5000 mg | ORAL_TABLET | Freq: Every day | ORAL | Status: DC

## 2016-07-23 MED ORDER — ASPIRIN 81 MG PO CHEW: 81.0000 mg | CHEWABLE_TABLET | Freq: Every day | ORAL | Status: DC

## 2016-07-23 MED ORDER — TRAZODONE HCL 100 MG PO TABS: 100.0000 mg | ORAL_TABLET | Freq: Every day | ORAL | 0 refills | Status: DC

## 2016-07-23 MED ORDER — ROSUVASTATIN CALCIUM 10 MG PO TABS: 5.0000 mg | ORAL_TABLET | Freq: Every day | ORAL | Status: DC

## 2016-07-23 MED ORDER — BISOPROLOL FUMARATE 5 MG PO TABS: 2.5000 mg | ORAL_TABLET | Freq: Every day | ORAL | Status: DC

## 2016-07-23 MED ORDER — LOSARTAN POTASSIUM 25 MG PO TABS: 12.5000 mg | ORAL_TABLET | Freq: Every day | ORAL | Status: DC

## 2016-07-23 MED ORDER — HYDROCODONE-ACETAMINOPHEN 5-325 MG PO TABS: 1.0000 | ORAL_TABLET | Freq: Four times a day (QID) | ORAL | 0 refills | Status: DC | PRN

## 2016-07-23 MED ORDER — FUROSEMIDE 20 MG PO TABS: 20.0000 mg | ORAL_TABLET | Freq: Every day | ORAL | Status: DC

## 2016-07-23 MED ORDER — BISOPROLOL FUMARATE 5 MG PO TABS
2.5000 mg | ORAL_TABLET | Freq: Every day | ORAL | Status: DC
Start: 2016-07-23 — End: 2016-09-16

## 2016-07-23 NOTE — Progress Notes (Signed)
CARDIAC REHAB PHASE I   PRE:  Rate/Rhythm: afib  64  BP:  Supine:   Sitting: 118/60  Standing:    SaO2: 98 RA  MODE:  Ambulation: 150 ft   POST:  Rate/Rhythm: 87 Afib  BP:  Supine:   Sitting: 124/50  Standing:    SaO2: 98 RA   Pt up to ambulate 150 feet x 2 assist and RW.  Pt tolerated well with no complaints.  Pt placed in bed for PICC line removal. Pt anticipates transfer to [facility].  Pt and daughter educated on heart healthy diet, exercise guidelines and activity restrictions.  Handouts provided.  Pt agreeable to contact information be sent to [facility] outpatient cardiac rehab phase II when pt is discharged from the rehab facility. Questions answered, no further complaint.  Pt eating lunch. Cherre Huger, BSN [phone]

## 2016-07-23 NOTE — Clinical Social Work Note (Signed)
CSW facilitated patient discharge including contacting patient family and facility to confirm patient discharge plans. Clinical information faxed to facility and family agreeable with plan. Patient's niece [name] will transport by car to [facility]. RN to call report prior to discharge [phone]).  CSW will sign off for now as social work intervention is no longer needed. Please consult [place] again if new needs arise.  Dayton Scrape, [place]

## 2016-07-23 NOTE — Clinical Social Work Placement (Signed)
   CLINICAL SOCIAL WORK PLACEMENT  NOTE  Date:  07/23/2016  Patient Details  Name: [name] [name] MRN: [id number] Date of Birth: 1932/02/11  Clinical Social Work is seeking post-discharge placement for this patient at the [place] level of care (*CSW will initial, date and re-position this form in  chart as items are completed):  Yes   Patient/family provided with [facility] Work Department's list of facilities offering this level of care within the geographic area requested by the patient (or if unable, by the patient's family).  Yes   Patient/family informed of their freedom to choose among providers that offer the needed level of care, that participate in Medicare, Medicaid or managed care program needed by the patient, have an available bed and are willing to accept the patient.  Yes   Patient/family informed of [facility]'s ownership interest in [facility] and [facility], as well as of the fact that they are under no obligation to receive care at these facilities.  PASRR submitted to EDS on       PASRR number received on       Existing PASRR number confirmed on 07/21/16     FL2 transmitted to all facilities in geographic area requested by pt/family on 07/21/16     FL2 transmitted to all facilities within larger geographic area on       Patient informed that his/her managed care company has contracts with or will negotiate with certain facilities, including the following:        Yes   Patient/family informed of bed offers received.  Patient chooses bed at [facility]     Physician recommends and patient chooses bed at      Patient to be transferred to [facility] on 07/23/16.  Patient to be transferred to facility by Car     Patient family notified on 07/23/16 of transfer.  Name of family member notified:  [name]     PHYSICIAN Please prepare prescriptions     Additional Comment:     _______________________________________________ Candie Chroman, LCSW 07/23/2016, 12:12 PM

## 2016-07-23 NOTE — Progress Notes (Signed)
This is an acute visit.  Level care skilled.  Facility [facility].  Chief complaint acute visit status post hospitalization for aortic valve replacement  History of present illness.  Patient is an 82 year old male with a complicated medical history.  He does have a long-standing history of aortic stenosis as well as atrial fibrillation. Patient had significant increase in shortness of breath and clinical symptoms and workup was done including a cardiac echo showed a severe low gradient low ejection fraction aortic stenosis.  Left ventricular ejection fraction was estimated 3540% with diffuse global hypokinesis.  There was also significant regurgitation.  He underwent an outpatient transesophageal echogram and catheterization on 06/17/2016.  Estimated ejection fraction on that study was 30-35 percent with severe aortic stenosis  Was also found to have severe right internal carotid artery stenosis who is referred for surgical consultation but before this was completed  apparently he had an ER visit on 06/28/2017 with increased shortness of breath a  Was admitted and had a thorough workup and found suitable for a TAB ER.  In regards to the carotid artery stenosis of 80% past for consult was obtained and a stent was placed on 07/11/2016-she was put on aspirin only since he is allergic to Plavix and was felt to high risk for pBrilanta  He did develop V. tach apparently on September 2 was started on IV in her ow  he was also on digoxin but developed bradycardia and digoxin was stopped.  Initially patient was stable enough to undergo T AVRon 07/19/2016  Apparently tolerated procedure well he was volume overloaded and diuresed also had anemia but did not require transfusion.  Also had thrombocytopenia but this resolved.  Follow up echo after surgery showed left ventricular ejection fraction of 30-35 percent.  He was started on Coumadin last INR apparently was 1.18.  He is here  for rehabilitation  Past Medical History  Past Medical History:  Diagnosis Date  . Aortic stenosis    a. mod-sev by echo 05/2016.  . Asthma   . Cerebrovascular disease 07/2010   TIA; carotid ultrasound in 07/2010-significant bilateral plaque without focal internal carotid artery stenosis; MRI -encephalomalacia left temporal and right temporal lobes; small inferior right cerebellar infarct; small vessel disease  . Chronic atrial fibrillation (HCC)    Paroxysmal; Echocardiogram in 2007-normal EF; mild LVH; left atrial enlargement; mild stenosis and minimal AI; negative stress nuclear study in 2008  . Chronic systolic CHF (congestive heart failure) ([place])    a. dx 05/2016 - EF 35-40%, diffuse HK, mod-severe AS, mod gradient, severe AVA VTI likely due to decreased cardiac output in setting of systolic dysfsunction and significant mitral regurgitation, mild MR, mod-severe MR, severe LAE, mild-mod RV dilation, mild RAE, mild-mod TR, mod PASP 23mmHg.  . CKD (chronic kidney disease), stage II   . Degenerative joint disease    feet and legs  . Diabetes mellitus    no insulin; A1c of 6.6 in 2005  . Dizziness    occurs daily,especially in am  . Exertional dyspnea   . Gastroesophageal reflux disease   . Hepatic steatosis   . History of noncompliance with medical treatment   . Hyperlipidemia    adverse reactions to statins and niacin  . Hypertension    Borderline  . Irregular heartbeat   . Mitral regurgitation    a. mod-sev by echo 05/2016  . Peripheral vascular disease ([place])   . Renal insufficiency   . Temporal arteritis ([place])   . Tricuspid regurgitation  a. mild-mod TR by echo 05/2016   Past Surgical History       Past Surgical History:  Procedure Laterality Date  . COLONOSCOPY  2002  . COLONOSCOPY  01/19/2012   Procedure: COLONOSCOPY;  Surgeon: Rogene Houston, MD;  Location[facility];  Service: Endoscopy;  Laterality: N/A;  1030  . LIPOMA EXCISION   1980  . ORIF ANKLE FRACTURE  2000   Right  . PROSTATE SURGERY  12/2011  . ROTATOR CUFF REPAIR     Right  . TEE WITHOUT CARDIOVERSION N/A 06/17/2016   Procedure: TRANSESOPHAGEAL ECHOCARDIOGRAM (TEE);  Surgeon: Jerline Pain, MD;  Location: [facility];  Service: Cardiovascular;  Laterality: N/A;  . TRANSURETHRAL RESECTION OF PROSTATE  09/2011  . URETHRAL STRICTURE DILATATION  1980s   Family History       Family History  Problem Relation Age of Onset  . Stroke Mother   . Diabetes Father   . Colon cancer Neg Hx    Social History  reports that he quit smoking about 24 years ago. His smoking use included Cigarettes. He started smoking about 66 years ago. He has a 20.00 pack-year smoking history. His smokeless tobacco use includes Chew. He reports that he does not drink alcohol or use drugs. Allergies       Allergies  Allergen Reactions  . Ambien [Zolpidem Tartrate] Other (See Comments)    Sleep walks  . Cholestatin   . Lipitor [Atorvastatin Calcium] Other (See Comments)    myalgias  . Ranitidine Other (See Comments)    Chest discomfort  . Simvastatin Other (See Comments)    Myalgias  . Xanax Xr [Alprazolam Er]     Tightness in chest    TAKE these medications   aspirin 81 MG chewable tablet Chew 1 tablet (81 mg total) by mouth daily.  bisoprolol 5 MG tablet Commonly known as:  ZEBETA Take 0.5 tablets (2.5 mg total) by mouth daily.  furosemide 20 MG tablet Commonly known as:  LASIX Take 1 tablet (20 mg total) by mouth daily. What changed:  how much to take  HYDROcodone-acetaminophen 5-325 MG tablet Commonly known as:  NORCO/VICODIN Take 1 tablet by mouth every 6 (six) hours as needed for moderate pain. What changed:  when to take this  losartan 25 MG tablet Commonly known as:  COZAAR Take 0.5 tablets (12.5 mg total) by mouth daily.  metFORMIN 500 MG tablet Commonly known as:  GLUCOPHAGE 1/2 tablet twice a day  nortriptyline 10 MG  capsule Commonly known as:  PAMELOR TAKE 1 TO 2 CAPSULES AT BEDTIME FOR BURNING IN FEET.  rosuvastatin 5 MG tablet Commonly known as:  CRESTOR Take 1 tablet (5 mg total) by mouth daily.  spironolactone 25 MG tablet Commonly known as:  ALDACTONE Take 0.5 tablets (12.5 mg total) by mouth daily.  traZODone 100 MG tablet Commonly known as:  DESYREL Take 1 tablet (100 mg total) by mouth at bedtime.  warfarin 5 MG tablet Commonly known as:  COUMADIN Take 1 tablet (5 mg total) by mouth daily at 6 PM. What changed:  when to take this  additional instructions       Review of systems.  In general says he's feeling a bit stronger no complaints of fever or chills.  Skin does not complain of rashes or itching.  Head ears eyes nose mouth and throat does not complain of any visual changes or sore throat.  Respiratory denies shortness of breath or cough.  Cardiac denies chest  pain has minimal lower extremity edema.  GI is not complaining of nausea vomiting diarrhea constipation or abdominal discomfort-says his appetite is improving.  GU does not complain of dysuria.  Musculoskeletal is not complaining of joint pain has some weakness generalized with his fairly lengthy hospitalization.  Neurologic is not complaining of dizziness headache numbness or syncope.  Psych is not complaining of anxiety or depression.  Physical exam.  Temperature is 97.9 pulse 65 respirations 20 blood pressure 110/50.  In general this is a very pleasant elderly male in no distress lying comfortably in bed.  His skin is warm and dry.--Does have bandaging over the right inguinal area also has a small area left inguinal area that appears unremarkable with a small amount of crusting.  Also has dressing over apparently venipuncture site right upper arm  Eyes pupils appear reactive light sclera and conjunctiva clear visual acuity appears grossly intact.  Oropharynx is clear mucous membranes  moist.  Chest is clear to auscultation there is no labored breathing.  Heart is regular with occasional irregular beat with a 2/6 systolic murmur he does not appear to have significant lower extremity edema I would say trace pedal pulses are intact.  Abdomen soft nontender positive bowel sounds.  Musculoskeletal is able to move all extremities 4 I do not note any deformities other than arthritic grip strength appears to be preserved bilaterally.  Neurologic is grossly intact to speech is clear no lateralizing findings.  Psych he is alert and oriented very pleasant and talkative.  Labs.  07/23/2016.  INR-1.18.  Sodium 134 potassium 4.1 BUN 11 creatinine 1.03.  07/22/2016.  Magnesium 1.8.  WBC 9.1 hemoglobin 10.2 platelets 153.  Assessment and plan.   History of aortic stenosis status post  TAVR-he appears to be stable at this point after a lengthy hospitalization he has been started on Coumadin recommendation to check INR midweek will check this on Monday, September 11-he has just been started on Coumadin and appears for 2 days-he denies any shortness of breath chest pain--- edema appears in minimal this appears stable.  History of combined CHF-he continues on spironolactone as well as Lasix will write an order to monitor weights daily notify provider of gain greater than 3 pounds-also will update a metabolic panel on Monday, September 11-of note also continues on a beta blocker as well as Cozaar.  History of diabetes type 2 he is on Glucophage 250 mg twice a day at this point will monitor CBGs notify provider of CBG less than 60 or greater than 300 per hospital review it appears his CBGs were largely in the 100s.  History of apparently  neuropathy especially in his feet he is on nortriptyline at HS  History of coronary artery disease he again he is on aspirin he is also on a statin and beta blocker  History of atrial fibrillation this appears rate controlled again he is on a  beta blocker and digoxin did make him bradycardic and was discontinued in hospital-he continues on Coumadin  Again patient's INRs will have to be monitored closely.  We will also get update his CBC and metabolic panel on Monday, September 11 as well.  Also monitor weights closely.  [id number] note greater than 45 minutes spent assessing patient-reviewing his chart-reviewing his labs-and coordinating and formulating a plan of care for numerous diagnoses-of note greater than 50% of time spent coordinating plan of care        .

## 2016-07-23 NOTE — Progress Notes (Signed)
Patient ID: [name] [name], male   DOB: 14-Aug-1932, 82 y.o.   MRN: [id number]   Advanced Heart Failure Rounding Note   Subjective:    Developed frank hematuria 07/10/16. Heparin stopped. UA and urine cytology sent. UA + for blood and WBCs.   S/p R Carotid stent 07/11/16  Urology saw 07/12/16 for hematuria/urinary retention. Thought to be likely due to infection. Cipro x 7 days + flomax. Had CBI catheter which is now out   Had VT on 9/2 while on milrinone. Started IV amio and electrolytes supped.  Was bradycardic and digoxin stopped (level 1.4) on 9/2.   S/P TAVR 9/5.  Off milrinone 9/8, co-ox 62% today.  Now off amiodarone.    Did not sleep well but otherwise doing ok.    Objective:   Weight Range:  Vital Signs:   Temp:  [97.4 F (36.3 C)-98.2 F (36.8 C)] 98.2 F (36.8 C) (09/09 0615) Pulse Rate:  [65-74] 65 (09/09 0615) Resp:  [20] 20 (09/08 2145) BP: (118-124)/(42-71) 121/51 (09/09 0615) SpO2:  [95 %-99 %] 95 % (09/09 0615) Weight:  [204 lb 12.9 oz (92.9 kg)] 204 lb 12.9 oz (92.9 kg) (09/09 0615) Last BM Date: 07/20/16  Weight change: Filed Weights   07/21/16 0500 07/22/16 0557 07/23/16 0615  Weight: 206 lb 12.7 oz (93.8 kg) 206 lb 6.4 oz (93.6 kg) 204 lb 12.9 oz (92.9 kg)    Intake/Output:   Intake/Output Summary (Last 24 hours) at 07/23/16 0936 Last data filed at 07/23/16 0244  Gross per 24 hour  Intake                0 ml  Output              715 ml  Net             -715 ml     Physical Exam: General:  NAD in the chair. [name]  HEENT: Normal Neck: supple. JVP 7 Carotids 2+ bilat; no bruits. No thyromegaly or nodule noted. Cor: PMI nondisplaced. Irregular No S3.  S2 crisp   Lungs: CTAB, normal effort on 3 liters [place] Abdomen: soft, NT, ND, no HSM. No bruits or masses. +BS  Extremities: no cyanosis, clubbing, rash, no edema.  Neuro: alert and oriented x3.   Telemetry: A fib 60s   Labs: Basic Metabolic Panel:  Recent Labs Lab 07/19/16 0433  07/19/16 1600  07/19/16 1624 07/20/16 0355 07/20/16 1841 07/21/16 0440 07/22/16 0500 07/23/16 0411  NA 136  < >  --  138 133*  --  136 134* 134*  K 4.0  < >  --  3.7 3.9  --  4.0 3.9 4.1  CL 104  < >  --  99* 98*  --  103 100* 101  CO2 28  --   --   --  24  --  27 26 26   GLUCOSE 158*  < >  --  137* 132*  --  146* 148* 140*  BUN 8  < >  --  7 8  --  7 10 11   CREATININE 1.14  < > 1.12 1.00 1.06 1.20 1.04 1.03 1.03  CALCIUM 8.6*  --   --   --  7.8*  --  8.4* 8.3* 8.5*  MG 1.8  --  2.7*  --  2.1 1.9  --  1.8  --   < > = values in this interval not displayed.  Liver Function Tests: No results for input(s): AST, ALT,  ALKPHOS, BILITOT, PROT, ALBUMIN in the last 168 hours. No results for input(s): LIPASE, AMYLASE in the last 168 hours. No results for input(s): AMMONIA in the last 168 hours.  CBC:  Recent Labs Lab 07/19/16 1044  07/19/16 1600 07/19/16 1624 07/20/16 0355 07/21/16 0440 07/22/16 0500  WBC 6.2  --  6.3  --  8.8 5.8 9.1  HGB 10.2*  < > 11.0* 11.2* 10.1* 10.1* 10.2*  HCT 30.5*  < > 33.4* 33.0* 30.3* 31.1* 30.5*  MCV 89.2  --  89.3  --  89.1 90.7 88.9  PLT 122*  --  132*  --  146* 123* 153  < > = values in this interval not displayed.  Cardiac Enzymes: No results for input(s): CKTOTAL, CKMB, CKMBINDEX, TROPONINI in the last 168 hours.  BNP: BNP (last 3 results)  Recent Labs  05/03/16 1305 05/28/16 1617 06/28/16 1316  BNP 232.0* 294.0* 222.0*    ProBNP (last 3 results) No results for input(s): PROBNP in the last 8760 hours.    Other results:  Imaging: Dg Chest 2 View  Result Date: 07/23/2016 CLINICAL DATA:  Atelectasis, congestive heart failure. EXAM: CHEST  2 VIEW COMPARISON:  Radiograph of July 20, 2016. FINDINGS: Stable cardiomediastinal silhouette. Right internal jugular catheter has been removed. Right-sided PICC line is unchanged in position. No pneumothorax or pleural effusion is noted. Status post aortic valve repair. Minimal bilateral pleural effusions are  noted. Stable mild bibasilar subsegmental atelectasis or edema is noted. IMPRESSION: Stable mild bibasilar subsegmental atelectasis or edema. Minimal bilateral pleural effusions are noted. Electronically Signed   By: Marijo Conception, M.D.   On: 07/23/2016 09:28     Medications:     Scheduled Medications: . aspirin  81 mg Oral Daily  . bisoprolol  2.5 mg Oral Daily  . docusate sodium  200 mg Oral BID  . furosemide  20 mg Oral Daily  . insulin aspart  0-15 Units Subcutaneous TID WC  . losartan  12.5 mg Oral Daily  . metFORMIN  250 mg Oral Q breakfast  . pantoprazole  40 mg Oral Daily  . polyethylene glycol  17 g Oral Daily  . sodium chloride flush  3 mL Intravenous Q12H  . spironolactone  12.5 mg Oral Daily  . traZODone  100 mg Oral QHS  . warfarin  5 mg Oral q1800  . Warfarin - Physician Dosing Inpatient   Does not apply q1800    Infusions:    PRN Medications: sodium chloride, bisacodyl, HYDROcodone-acetaminophen, metoprolol, nortriptyline, ondansetron (ZOFRAN) IV, sodium chloride flush, sodium chloride flush   Assessment:   1. Cardiogenic Shock- TEE with EF 30-35% 2. Acute Respiratory Failure 3. A fib: Persistent.  4. CAD -  2 vessel disease 5. Valvular Heart Disease-  Severe AS/moderate-severe MR (MR may be functional).  6. Caroltid Stenosis- Severe carotid stenosis RICA >80% stenosis by carotid dopplers, CTA head/neck with critical >90% RICA stenosis an [id number] LICA.     --s/p R carotid stent 8/28 7. Bilateral Effusions  8. Nasopharyngeal mass 9. Rash- 10. Hyperkalemia 11. Hematuria 12. UTI 13. VT 14. S/P AVR  Plan/Discussion:    S/p R carotid stent 07/11/16. Continue ASA (allergic to plavix and too high risk for Brilinta).   S/p TAVR 9/5. Completed post op antibiotic course.   Co-ox 62% off milrinone. Volume status stable.  Continue lasix 20 mg daily. Continue 12.5 mg spironolactone and 12.5 mg losartan daily. Renal function stable.   No further VT after  milrinone decreased,  now off milrinone.  Afib with good rate control.  He is now off amiodarone.  Will add low dose bisoprolol 2.5 mg daily today.  2 vessel CAD, on ASA 81 and low dose Crestor (prior myalgias with simvastatin).   Possibly to [facility] this weekend.  Cardiac meds for discharge: warfarin (needs coumadin clinic followup), ASA 81, losartan 12.5 daily, spironolactone 12.5 daily, bisoprolol 2.5 daily, Crestor 5 daily, Lasix 20 daily.  Needs followup CHF clinic 10 days.   Length of Stay: [name], 9:36 AM Advanced Heart Failure Team Pager [phone] (M-F; 7a - 4p)  Please contact [facility] for night-coverage after hours (4p -7a ) and weekends on [email or web address]

## 2016-07-23 NOTE — Progress Notes (Addendum)
[address]       [place] [address]             [phone]      4 Days Post-Op Procedure(s) (LRB): TRANSCATHETER AORTIC VALVE REPLACEMENT, TRANSFEMORAL (N/A) TRANSESOPHAGEAL ECHOCARDIOGRAM (TEE) (N/A) Subjective: conts to feel better, still fairly weak. Not SOB at rest  Objective: Vital signs in last 24 hours: Temp:  [97.4 F (36.3 C)-98.2 F (36.8 C)] 98.2 F (36.8 C) (09/09 0615) Pulse Rate:  [65-74] 65 (09/09 0615) Cardiac Rhythm: Atrial fibrillation (09/08 1946) Resp:  [20] 20 (09/08 2145) BP: (118-124)/(42-71) 121/51 (09/09 0615) SpO2:  [95 %-99 %] 95 % (09/09 0615) Weight:  [204 lb 12.9 oz (92.9 kg)] 204 lb 12.9 oz (92.9 kg) (09/09 0615)  Hemodynamic parameters for last 24 hours:    Intake/Output from previous day: 09/08 0701 - 09/09 0700 In: -  Out: 715 [Urine:715] Intake/Output this shift: No intake/output data recorded.  General appearance: alert, cooperative and no distress Heart: irregularly irregular rhythm and 2/6 syst mitral murmur Lungs: clear to auscultation bilaterally Abdomen: benign Extremities: no edema Wound: dressings CDI  Lab Results:  Recent Labs  07/21/16 0440 07/22/16 0500  WBC 5.8 9.1  HGB 10.1* 10.2*  HCT 31.1* 30.5*  PLT 123* 153   BMET:  Recent Labs  07/22/16 0500 07/23/16 0411  NA 134* 134*  K 3.9 4.1  CL 100* 101  CO2 26 26  GLUCOSE 148* 140*  BUN 10 11  CREATININE 1.03 1.03  CALCIUM 8.3* 8.5*    PT/INR:  Recent Labs  07/23/16 0411  LABPROT 15.1  INR 1.18   ABG    Component Value Date/Time   PHART 7.394 07/19/2016 1054   HCO3 23.4 07/19/2016 1054   TCO2 28 07/19/2016 1624   ACIDBASEDEF 2.0 07/19/2016 1054   O2SAT 61.8 07/23/2016 0504   CBG (last 3)   Recent Labs  07/22/16 1650 07/22/16 2050 07/23/16 0629  GLUCAP 125* 130* 130*    Meds Scheduled Meds: . aspirin  81 mg Oral Daily  . docusate sodium  200 mg Oral BID  . furosemide  20 mg Oral Daily  . insulin  aspart  0-15 Units Subcutaneous TID WC  . losartan  12.5 mg Oral Daily  . metFORMIN  250 mg Oral Q breakfast  . pantoprazole  40 mg Oral Daily  . polyethylene glycol  17 g Oral Daily  . sodium chloride flush  3 mL Intravenous Q12H  . spironolactone  12.5 mg Oral Daily  . traZODone  100 mg Oral QHS  . warfarin  5 mg Oral q1800  . Warfarin - Physician Dosing Inpatient   Does not apply q1800   Continuous Infusions:  PRN Meds:.sodium chloride, bisacodyl, HYDROcodone-acetaminophen, metoprolol, nortriptyline, ondansetron (ZOFRAN) IV, sodium chloride flush, sodium chloride flush  Xrays No results found.  Assessment/Plan: S/P Procedure(s) (LRB): TRANSCATHETER AORTIC VALVE REPLACEMENT, TRANSFEMORAL (N/A) TRANSESOPHAGEAL ECHOCARDIOGRAM (TEE) (N/A)  1 steady progress 2 Co-Ox 61 off milrinone, does not appear volume overloaded on current diuretic, CXR may be a little wet. BP is tolerating ARB 3 INR 1.18 today , cont coumadin. Chronic AFIB- one episode of TACHY, o/w rate appears controlled 4 Lytes/renal fxn stable 5 sugars slightly elevated on current rx 6 To [facility] when all agree  LOS: 25 days    GOLD,WAYNE E 07/23/2016  I have seen and examined the patient and agree with the assessment and plan as outlined.  Looks ready for d/c to  SNF  Rexene Alberts, MD 07/23/2016 11:14 AM

## 2016-07-25 ENCOUNTER — Other Ambulatory Visit: Payer: Self-pay | Admitting: *Deleted

## 2016-07-25 ENCOUNTER — Other Ambulatory Visit (HOSPITAL_COMMUNITY)
Admission: RE | Admit: 2016-07-25 | Discharge: 2016-07-25 | Disposition: A | Discharge status: Home / Self Care | Payer: Medicare Other | Source: Skilled Nursing Facility | Attending: Internal Medicine | Admitting: Internal Medicine

## 2016-07-25 ENCOUNTER — Encounter: Payer: Self-pay | Admitting: Internal Medicine

## 2016-07-25 ENCOUNTER — Non-Acute Institutional Stay (SKILLED_NURSING_FACILITY): Payer: Medicare Other | Admitting: Internal Medicine

## 2016-07-25 DIAGNOSIS — Z954 Presence of other heart-valve replacement: Secondary | ICD-10-CM | POA: Diagnosis not present

## 2016-07-25 DIAGNOSIS — I5043 Acute on chronic combined systolic (congestive) and diastolic (congestive) heart failure: Secondary | ICD-10-CM | POA: Diagnosis not present

## 2016-07-25 DIAGNOSIS — I480 Paroxysmal atrial fibrillation: Secondary | ICD-10-CM | POA: Diagnosis not present

## 2016-07-25 DIAGNOSIS — E1142 Type 2 diabetes mellitus with diabetic polyneuropathy: Secondary | ICD-10-CM | POA: Diagnosis not present

## 2016-07-25 DIAGNOSIS — I2510 Atherosclerotic heart disease of native coronary artery without angina pectoris: Secondary | ICD-10-CM | POA: Insufficient documentation

## 2016-07-25 DIAGNOSIS — Z952 Presence of prosthetic heart valve: Secondary | ICD-10-CM

## 2016-07-25 LAB — BASIC METABOLIC PANEL
Anion gap: 7 (ref 5–15)
BUN: 19 mg/dL (ref 6–20)
CO2: 26 mmol/L (ref 22–32)
Calcium: 8.4 mg/dL — ABNORMAL LOW (ref 8.9–10.3)
Chloride: 103 mmol/L (ref 101–111)
Creatinine, Ser: 0.89 mg/dL (ref 0.61–1.24)
GFR calc Af Amer: >60 mL/min (ref >60)
GFR calc non Af Amer: >60 mL/min (ref >60)
Glucose, Bld: 127 mg/dL — ABNORMAL HIGH (ref 65–99)
Potassium: 3.7 mmol/L (ref 3.5–5.1)
Sodium: 136 mmol/L (ref 135–145)

## 2016-07-25 LAB — CBC WITH DIFFERENTIAL/PLATELET
Basophils Absolute: 0.1 10*3/uL (ref 0.0–0.1)
Basophils Relative: 1 %
Eosinophils Absolute: 0.5 10*3/uL (ref 0.0–0.7)
Eosinophils Relative: 7 %
HCT: 32.9 % — ABNORMAL LOW (ref 39.0–52.0)
Hemoglobin: 11 g/dL — ABNORMAL LOW (ref 13.0–17.0)
Lymphocytes Relative: 14 %
Lymphs Abs: 1 10*3/uL (ref 0.7–4.0)
MCH: 30.1 pg (ref 26.0–34.0)
MCHC: 33.4 g/dL (ref 30.0–36.0)
MCV: 89.9 fL (ref 78.0–100.0)
Monocytes Absolute: 0.5 10*3/uL (ref 0.1–1.0)
Monocytes Relative: 8 %
Neutro Abs: 4.9 10*3/uL (ref 1.7–7.7)
Neutrophils Relative %: 70 %
Platelets: 213 10*3/uL (ref 150–400)
RBC: 3.66 MIL/uL — ABNORMAL LOW (ref 4.22–5.81)
RDW: 14.2 % (ref 11.5–15.5)
WBC: 7 10*3/uL (ref 4.0–10.5)

## 2016-07-25 LAB — PROTIME-INR
INR: 1.25
PROTHROMBIN TIME: 15.8 s — AB (ref 11.4–15.2)

## 2016-07-25 MED ORDER — HYDROCODONE-ACETAMINOPHEN 5-325 MG PO TABS: 1.0000 | ORAL_TABLET | Freq: Four times a day (QID) | ORAL | 0 refills | Status: DC | PRN

## 2016-07-25 MED FILL — Insulin Regular (Human) Inj 100 Unit/ML: INTRAMUSCULAR | Qty: 250 | Status: AC

## 2016-07-25 NOTE — Telephone Encounter (Signed)
[facility] #[phone] Fax: [phone]   

## 2016-07-25 NOTE — Care Management Note (Signed)
Case Management Note Marvetta Gibbons RN, BSN Unit 2W-Case Manager [phone]  Patient Details  Name: [name] [name] MRN: [id number] Date of Birth: 1932-01-24  Subjective/Objective:  Pt s/p TAVR tx from 2S on 9/7- pt remains on IV milrinone gtt                  Action/Plan: PTA pt lived at home with wife- - PCP is Dr. Mickie Hillier,   Pt has been active with [facility] in the past- per PT evals recommending STSNF- however pt may refuse- if returns home pt will need HH orders for HHRN/PT, pt has cane, RW, and w/c at home.  CM to continue to follow for d/c needs.   Expected Discharge Date:     07/23/16             Expected Discharge Plan:  [place]  In-House Referral:  NA  Discharge planning Services  CM Consult  Post Acute Care Choice:  Home Health Choice offered to:  Patient  DME Arranged:    DME Agency:     HH Arranged:    [place] Agency:  [place]  Status of Service:  Completed, signed off  If discussed at [place] of Stay Meetings, dates discussed:    Discharge Disposition: skilled facility   Additional Comments:  07/22/16- per CSW plan for [place] following for placement needs  Dawayne Patricia, RN 07/25/2016, 10:06 AM

## 2016-07-25 NOTE — Progress Notes (Signed)
Provider: Veleta Miners  Location:   [facility] Room Number: 130/P Place of Service:  SNF (31)  PCP: Sallee Lange, MD Patient Care Team: Kathyrn Drown, MD as PCP - General (Family Medicine) Yehuda Savannah, MD (Cardiology) [facility], MD as Attending Physician Irine Seal, MD as Attending Physician (Urology)  Extended Emergency Contact Information Primary Emergency Contact: [place] Phone: [phone] Relation: Nephew Secondary Emergency Contact: Lois Huxley [place] Phone: [phone] Mobile Phone: [phone] Relation: Niece  Code Status: Full Code Goals of Care: Advanced Directive information Advanced Directives 07/25/2016  Does patient have an advance directive? Yes  Type of Advance Directive [place]  Does patient want to make changes to advanced directive? No - Patient declined  Copy of advanced directive(s) in chart? Yes  Would patient like information on creating an advanced directive? -  Pre-existing out of facility DNR order (yellow form or pink MOST form) -      Chief Complaint  Patient presents with  . New Admit To SNF    HPI: Patient is a 82 y.o. male seen today for admission to SNF for rehab. Patient went Transcatheter Aortic Valve Replacement From Percutaneous left  Transfemoral Approach. For severe symptomatic Aortic Stenosis. On 07/19/2016.He also Had Right Internal Carotid artery stent for 80% Stenosis on 08/28. Before the Procedure patient had Hematuria and was followed by Urologist. It was thought to be secondary to Infection and medications. Post op course has been benign. He was slowly tapered off his IV drips and Oxygen. He was discharged to SNF for further Therapy.  Patient today is not C/O any Chest pain or SOB.  He feels some numbness in his toes which has been his chronic problem. He does have some problems with coming up right  words . Past Medical History:  Diagnosis Date  . Aortic stenosis    a. mod-sev by echo 05/2016.  . Asthma   . Cerebrovascular disease 07/2010   TIA; carotid ultrasound in 07/2010-significant bilateral plaque without focal internal carotid artery stenosis; MRI -encephalomalacia left temporal and right temporal lobes; small inferior right cerebellar infarct; small vessel disease  . Chronic atrial fibrillation (HCC)    Paroxysmal; Echocardiogram in 2007-normal EF; mild LVH; left atrial enlargement; mild stenosis and minimal AI; negative stress nuclear study in 2008  . Chronic systolic CHF (congestive heart failure) ([place])    a. dx 05/2016 - EF 35-40%, diffuse HK, mod-severe AS, mod gradient, severe AVA VTI likely due to decreased cardiac output in setting of systolic dysfsunction and significant mitral regurgitation, mild MR, mod-severe MR, severe LAE, mild-mod RV dilation, mild RAE, mild-mod TR, mod PASP 77mmHg.  . CKD (chronic kidney disease), stage II   . Degenerative joint disease    feet and legs  . Diabetes mellitus    no insulin; A1c of 6.6 in 2005  . Dizziness    occurs daily,especially in am  . Exertional dyspnea   . Gastroesophageal reflux disease   . Hepatic steatosis   . History of noncompliance with medical treatment   . Hyperlipidemia    adverse reactions to statins and niacin  . Hypertension    Borderline  . Irregular heartbeat   . Mitral regurgitation    a. mod-sev by echo 05/2016  . Peripheral vascular disease ([place])   . Renal insufficiency   . S/P TAVR (transcatheter aortic valve replacement) 07/19/2016   29 mm Edwards Sapien 3 transcatheter heart  valve placed via left percutaneous transfemoral approach  . Temporal arteritis ([place])   . Tricuspid regurgitation    a. mild-mod TR by echo 05/2016   Past Surgical History:  Procedure Laterality Date  . COLONOSCOPY  2002  . COLONOSCOPY  01/19/2012   Procedure: COLONOSCOPY;  Surgeon: Rogene Houston, MD;  Location: [facility];   Service: Endoscopy;  Laterality: N/A;  1030  . LIPOMA EXCISION  1980  . ORIF ANKLE FRACTURE  2000   Right  . PERIPHERAL VASCULAR CATHETERIZATION N/A 07/11/2016   Procedure: Carotid PTA/Stent Intervention;  Surgeon: Lorretta Harp, MD;  Location: [facility];  Service: Cardiovascular;  Laterality: N/A;  . PROSTATE SURGERY  12/2011  . ROTATOR CUFF REPAIR     Right  . TEE WITHOUT CARDIOVERSION N/A 06/17/2016   Procedure: TRANSESOPHAGEAL ECHOCARDIOGRAM (TEE);  Surgeon: Jerline Pain, MD;  Location: [facility];  Service: Cardiovascular;  Laterality: N/A;  . TEE WITHOUT CARDIOVERSION N/A 07/19/2016   Procedure: TRANSESOPHAGEAL ECHOCARDIOGRAM (TEE);  Surgeon: Sherren Mocha, MD;  Location: [facility];  Service: Open Heart Surgery;  Laterality: N/A;  . TRANSCATHETER AORTIC VALVE REPLACEMENT, TRANSFEMORAL N/A 07/19/2016   Procedure: TRANSCATHETER AORTIC VALVE REPLACEMENT, TRANSFEMORAL;  Surgeon: Sherren Mocha, MD;  Location: [facility];  Service: Open Heart Surgery;  Laterality: N/A;  . TRANSURETHRAL RESECTION OF PROSTATE  09/2011  . URETHRAL STRICTURE DILATATION  1980s    reports that he quit smoking about 24 years ago. His smoking use included Cigarettes. He started smoking about 66 years ago. He has a 20.00 pack-year smoking history. His smokeless tobacco use includes Chew. He reports that he does not drink alcohol or use drugs. Social History   Social History  . Marital status: Married    Spouse name: N/A  . Number of children: 1  . Years of education: N/A   Occupational History  . Retired    Social History Main Topics  . Smoking status: Former Smoker    Packs/day: 1.00    Years: 20.00    Types: Cigarettes    Start date: 07/04/1950    Quit date: 04/25/1992  . Smokeless tobacco: Current User    Types: Chew  . Alcohol use No  . Drug use: No  . Sexual activity: Not Currently   Other Topics Concern  . Not on file   Social History Narrative  . No narrative on file    Functional Status  Survey:    Family History  Problem Relation Age of Onset  . Stroke Mother   . Diabetes Father   . Colon cancer Neg Hx       Allergies  Allergen Reactions  . Ambien [Zolpidem Tartrate] Other (See Comments)    Sleep walks  . Lipitor [Atorvastatin Calcium] Other (See Comments)    myalgias  . Ranitidine Other (See Comments)    Chest discomfort  . Simvastatin Other (See Comments)    Myalgias  . Xanax Xr [Alprazolam Er] Other (See Comments)    Tightness in chest  . Cholestatin Other (See Comments)    UNSPECIFIED REACTION   . Clopidogrel Bisulfate Rash      Medication List       Accurate as of 07/25/16  3:14 PM. Always use your most recent med list.          aspirin 81 MG chewable tablet Chew 1 tablet (81 mg total) by mouth daily.   bisoprolol 5 MG tablet Commonly known as:  ZEBETA Take 0.5 tablets (2.5 mg  total) by mouth daily.   furosemide 20 MG tablet Commonly known as:  LASIX Take 1 tablet (20 mg total) by mouth daily.   HYDROcodone-acetaminophen 5-325 MG tablet Commonly known as:  NORCO/VICODIN Take 1 tablet by mouth every 6 (six) hours as needed for moderate pain. DNE 3gm APAP in 24 hours from all sources   losartan 25 MG tablet Commonly known as:  COZAAR Take 0.5 tablets (12.5 mg total) by mouth daily.   metFORMIN 500 MG tablet Commonly known as:  GLUCOPHAGE 1/2 tablet twice a day   nortriptyline 10 MG capsule Commonly known as:  PAMELOR TAKE 1 TO 2 CAPSULES AT BEDTIME FOR BURNING IN FEET.   rosuvastatin 5 MG tablet Commonly known as:  CRESTOR Take 1 tablet (5 mg total) by mouth daily.   spironolactone 25 MG tablet Commonly known as:  ALDACTONE Take 0.5 tablets (12.5 mg total) by mouth daily.   traZODone 100 MG tablet Commonly known as:  DESYREL Take 1 tablet (100 mg total) by mouth at bedtime.   warfarin 5 MG tablet Commonly known as:  COUMADIN Take 1 tablet (5 mg total) by mouth daily at 6 PM.       Review of Systems    Constitutional: Positive for fatigue. Negative for chills, diaphoresis and fever.  HENT: Negative for postnasal drip and rhinorrhea.        C/O Dry mouth  Respiratory: Negative.  Negative for cough, choking and shortness of breath.   Cardiovascular: Negative.  Negative for chest pain, palpitations and leg swelling.  Gastrointestinal: Negative for abdominal distention, abdominal pain, blood in stool, constipation and diarrhea.  Genitourinary: Negative for dysuria and hematuria.    Vitals:   07/25/16 1427  BP: 111/69  Pulse: 90  Resp: 20  Temp: 97.7 F (36.5 C)  TempSrc: Oral   There is no height or weight on file to calculate BMI. Physical Exam  Constitutional: He is oriented to person, place, and time. He appears well-developed and well-nourished.  HENT:  Head: Normocephalic.  Eyes: Pupils are equal, round, and reactive to light.  Neck: Neck supple.  Cardiovascular: Normal heart sounds.  An irregular rhythm present.  Pulmonary/Chest: Effort normal and breath sounds normal. He has no wheezes. He has no rales. He exhibits no tenderness.  Abdominal: Bowel sounds are normal. He exhibits no distension. There is no tenderness. There is no rebound.  Musculoskeletal: He exhibits no edema.  Neurological: He is alert and oriented to person, place, and time.  Good Motor Strength.  Skin: Petechiae noted.    Labs reviewed: Basic Metabolic Panel:  Recent Labs  07/20/16 0355 07/20/16 1841  07/22/16 0500 07/23/16 0411 07/25/16 0655  NA 133*  --   < > 134* 134* 136  K 3.9  --   < > 3.9 4.1 3.7  CL 98*  --   < > 100* 101 103  CO2 24  --   < > 26 26 26   GLUCOSE 132*  --   < > 148* 140* 127*  BUN 8  --   < > 10 11 19   CREATININE 1.06 1.20  < > 1.03 1.03 0.89  CALCIUM 7.8*  --   < > 8.3* 8.5* 8.4*  MG 2.1 1.9  --  1.8  --   --   < > = values in this interval not displayed. Liver Function Tests:  Recent Labs  05/03/16 1305 05/09/16 1357 06/28/16 1316  AST 61* 15 24  ALT 97*  38 27  ALKPHOS 70 77 72  BILITOT 2.0* 0.9 1.7*  PROT 7.4 6.5 7.1  ALBUMIN 4.1 3.9 3.9     CBC:  Recent Labs  06/09/16 1654 06/28/16 1316  07/21/16 0440 07/22/16 0500 07/25/16 0655  WBC 7.2 6.8  < > 5.8 9.1 7.0  NEUTROABS 4,896 5.1  --   --   --  4.9  HGB 15.2 15.2  < > 10.1* 10.2* 11.0*  HCT 46.5 44.6  < > 31.1* 30.5* 32.9*  MCV 89.6 89.6  < > 90.7 88.9 89.9  PLT 256 266  < > 123* 153 213  < > = values in this interval not displayed. Cardiac Enzymes:  Recent Labs  08/20/15 1745 05/03/16 1305 05/28/16 1617  TROPONINI <0.03 0.03 <0.03   BNP: Invalid input(s): POCBNP Lab Results  Component Value Date   HGBA1C 6.1 05/23/2016   Lab Results  Component Value Date   TSH 2.730 04/28/2013       Imaging and Procedures obtained prior to SNF admission: No results found.  Assessment/Plan  S/P TAVR (transcatheter aortic valve replacement)  Patient here for rehab. He is doing better and is stable. He wants to eventually go home with his wife. Will Continue Aspirin and coumadin.  Type 2 diabetes mellitus with diabetic polyneuropathy  Blood Sugars are in 150-160. Accu check Qd. Patient is on Metformin. Renal function stable. Continue Nortriptyline.  Acute on chronic combined systolic and diastolic CHF, NYHA class   Patient is stable on Lasix and spironolactone. Will follow clinically. Also repeat BMP in few days. Renal function normal.  Paroxysmal atrial fibrillation (HCC) Rate control on Bisprolol. Increased dose of Coumadin to 6mg  as Last INR was 1.25. Will slowly reach to desire INR B/W 2-3.        Family/ staff Communication:   Labs/tests ordered:

## 2016-07-26 ENCOUNTER — Inpatient Hospital Stay
Admission: RE | Admit: 2016-07-26 | Discharge: 2016-08-19 | Disposition: A | Discharge status: Home / Self Care | Payer: Medicare Other | Source: Ambulatory Visit | Attending: Internal Medicine | Admitting: Internal Medicine

## 2016-07-26 ENCOUNTER — Other Ambulatory Visit: Payer: Self-pay

## 2016-07-26 DIAGNOSIS — Z952 Presence of prosthetic heart valve: Secondary | ICD-10-CM

## 2016-07-26 DIAGNOSIS — I350 Nonrheumatic aortic (valve) stenosis: Secondary | ICD-10-CM

## 2016-07-28 ENCOUNTER — Encounter (HOSPITAL_COMMUNITY)
Admission: RE | Admit: 2016-07-28 | Discharge: 2016-07-28 | Disposition: A | Discharge status: Home / Self Care | Payer: Medicare Other | Source: Skilled Nursing Facility | Attending: Internal Medicine | Admitting: Internal Medicine

## 2016-07-28 LAB — PROTIME-INR
INR: 1.56
PROTHROMBIN TIME: 18.8 s — AB (ref 11.4–15.2)

## 2016-08-01 ENCOUNTER — Other Ambulatory Visit (HOSPITAL_COMMUNITY)
Admission: RE | Admit: 2016-08-01 | Discharge: 2016-08-01 | Disposition: A | Discharge status: Home / Self Care | Payer: Medicare Other | Source: Skilled Nursing Facility | Attending: Internal Medicine | Admitting: Internal Medicine

## 2016-08-01 DIAGNOSIS — I2510 Atherosclerotic heart disease of native coronary artery without angina pectoris: Secondary | ICD-10-CM | POA: Insufficient documentation

## 2016-08-01 LAB — COMPREHENSIVE METABOLIC PANEL
ALBUMIN: 3.3 g/dL — AB (ref 3.5–5.0)
ALT: 16 U/L — ABNORMAL LOW (ref 17–63)
ANION GAP: 7 (ref 5–15)
AST: 16 U/L (ref 15–41)
Alkaline Phosphatase: 67 U/L (ref 38–126)
BUN: 20 mg/dL (ref 6–20)
CO2: 27 mmol/L (ref 22–32)
Calcium: 8.9 mg/dL (ref 8.9–10.3)
Chloride: 102 mmol/L (ref 101–111)
Creatinine, Ser: 1 mg/dL (ref 0.61–1.24)
GFR calc non Af Amer: >60 mL/min (ref >60)
GLUCOSE: 131 mg/dL — AB (ref 65–99)
POTASSIUM: 4 mmol/L (ref 3.5–5.1)
SODIUM: 136 mmol/L (ref 135–145)
TOTAL PROTEIN: 6.2 g/dL — AB (ref 6.5–8.1)
Total Bilirubin: 0.3 mg/dL (ref 0.3–1.2)

## 2016-08-02 ENCOUNTER — Encounter (HOSPITAL_COMMUNITY)
Admission: RE | Admit: 2016-08-02 | Discharge: 2016-08-02 | Disposition: A | Discharge status: Home / Self Care | Payer: Medicare Other | Source: Skilled Nursing Facility | Attending: *Deleted | Admitting: *Deleted

## 2016-08-02 LAB — PROTIME-INR
INR: 2.66
PROTHROMBIN TIME: 28.8 s — AB (ref 11.4–15.2)

## 2016-08-03 ENCOUNTER — Encounter: Payer: Self-pay | Admitting: Internal Medicine

## 2016-08-03 NOTE — Progress Notes (Signed)
Location:   [facility] Room Number: 160/P Place of Service:  SNF (31)  Provider: Veleta Miners  PCP: Sallee Lange, MD Patient Care Team: Kathyrn Drown, MD as PCP - General (Family Medicine) Yehuda Savannah, MD (Cardiology) [facility], MD as Attending Physician Irine Seal, MD as Attending Physician (Urology)  Extended Emergency Contact Information Primary Emergency Contact: [place] Phone: [phone] Relation: Nephew Secondary Emergency Contact: Lois H[place] Phone: [phone] Mobile Phone: [phone] Relation: Niece  Code Status: Full Code Goals of care:  Advanced Directive information Advanced Directives 08/03/2016  Does patient have an advance directive? Yes  Type of Advance Directive [place]  Does patient want to make changes to advanced directive? No - Patient declined  Copy of advanced directive(s) in chart? Yes  Would patient like information on creating an advanced directive? -  Pre-existing out of facility DNR order (yellow form or pink MOST form) -     Allergies  Allergen Reactions  . Ambien [Zolpidem Tartrate] Other (See Comments)    Sleep walks  . Lipitor [Atorvastatin Calcium] Other (See Comments)    myalgias  . Ranitidine Other (See Comments)    Chest discomfort  . Simvastatin Other (See Comments)    Myalgias  . Xanax Xr [Alprazolam Er] Other (See Comments)    Tightness in chest  . Cholestatin Other (See Comments)    UNSPECIFIED REACTION   . Clopidogrel Bisulfate Rash    Chief Complaint  Patient presents with  . Discharge Note    HPI:  82 y.o. male      Past Medical History:  Diagnosis Date  . Aortic stenosis    a. mod-sev by echo 05/2016.  . Asthma   . Cerebrovascular disease 07/2010   TIA; carotid ultrasound in 07/2010-significant bilateral plaque without focal internal carotid artery stenosis; MRI  -encephalomalacia left temporal and right temporal lobes; small inferior right cerebellar infarct; small vessel disease  . Chronic atrial fibrillation (HCC)    Paroxysmal; Echocardiogram in 2007-normal EF; mild LVH; left atrial enlargement; mild stenosis and minimal AI; negative stress nuclear study in 2008  . Chronic systolic CHF (congestive heart failure) ([place])    a. dx 05/2016 - EF 35-40%, diffuse HK, mod-severe AS, mod gradient, severe AVA VTI likely due to decreased cardiac output in setting of systolic dysfsunction and significant mitral regurgitation, mild MR, mod-severe MR, severe LAE, mild-mod RV dilation, mild RAE, mild-mod TR, mod PASP 49mmHg.  . CKD (chronic kidney disease), stage II   . Degenerative joint disease    feet and legs  . Diabetes mellitus    no insulin; A1c of 6.6 in 2005  . Dizziness    occurs daily,especially in am  . Exertional dyspnea   . Gastroesophageal reflux disease   . Hepatic steatosis   . History of noncompliance with medical treatment   . Hyperlipidemia    adverse reactions to statins and niacin  . Hypertension    Borderline  . Irregular heartbeat   . Mitral regurgitation    a. mod-sev by echo 05/2016  . Peripheral vascular disease ([place])   . Renal insufficiency   . S/P TAVR (transcatheter aortic valve replacement) 07/19/2016   29 mm Edwards Sapien 3 transcatheter heart valve placed via left percutaneous transfemoral approach  . Temporal arteritis ([place])   . Tricuspid regurgitation    a. mild-mod TR by echo 05/2016    Past Surgical History:  Procedure  Laterality Date  . COLONOSCOPY  2002  . COLONOSCOPY  01/19/2012   Procedure: COLONOSCOPY;  Surgeon: Rogene Houston, MD;  [facility];  Service: Endoscopy;  Laterality: N/A;  1030  . LIPOMA EXCISION  1980  . ORIF ANKLE FRACTURE  2000   Right  . PERIPHERAL VASCULAR CATHETERIZATION N/A 07/11/2016   Procedure: Carotid PTA/Stent Intervention;  Surgeon: Lorretta Harp, MD;  Location: [facility];  Service: Cardiovascular;  Laterality: N/A;  . PROSTATE SURGERY  12/2011  . ROTATOR CUFF REPAIR     Right  . TEE WITHOUT CARDIOVERSION N/A 06/17/2016   Procedure: TRANSESOPHAGEAL ECHOCARDIOGRAM (TEE);  Surgeon: Jerline Pain, MD;  Location: [facility];  Service: Cardiovascular;  Laterality: N/A;  . TEE WITHOUT CARDIOVERSION N/A 07/19/2016   Procedure: TRANSESOPHAGEAL ECHOCARDIOGRAM (TEE);  Surgeon: Sherren Mocha, MD;  Location: [facility];  Service: Open Heart Surgery;  Laterality: N/A;  . TRANSCATHETER AORTIC VALVE REPLACEMENT, TRANSFEMORAL N/A 07/19/2016   Procedure: TRANSCATHETER AORTIC VALVE REPLACEMENT, TRANSFEMORAL;  Surgeon: Sherren Mocha, MD;  Location: [facility];  Service: Open Heart Surgery;  Laterality: N/A;  . TRANSURETHRAL RESECTION OF PROSTATE  09/2011  . URETHRAL STRICTURE DILATATION  1980s      reports that he quit smoking about 24 years ago. His smoking use included Cigarettes. He started smoking about 66 years ago. He has a 20.00 pack-year smoking history. His smokeless tobacco use includes Chew. He reports that he does not drink alcohol or use drugs. Social History   Social History  . Marital status: Married    Spouse name: N/A  . Number of children: 1  . Years of education: N/A   Occupational History  . Retired    Social History Main Topics  . Smoking status: Former Smoker    Packs/day: 1.00    Years: 20.00    Types: Cigarettes    Start date: 07/04/1950    Quit date: 04/25/1992  . Smokeless tobacco: Current User    Types: Chew  . Alcohol use No  . Drug use: No  . Sexual activity: Not Currently   Other Topics Concern  . Not on file   Social History Narrative  . No narrative on file   Functional Status Survey:    Allergies  Allergen Reactions  . Ambien [Zolpidem Tartrate] Other (See Comments)    Sleep walks  . Lipitor [Atorvastatin Calcium] Other (See Comments)    myalgias  . Ranitidine Other (See Comments)    Chest discomfort  .  Simvastatin Other (See Comments)    Myalgias  . Xanax Xr [Alprazolam Er] Other (See Comments)    Tightness in chest  . Cholestatin Other (See Comments)    UNSPECIFIED REACTION   . Clopidogrel Bisulfate Rash    Pertinent  Health Maintenance Due  Topic Date Due  . OPHTHALMOLOGY EXAM  07/04/1942  . FOOT EXAM  05/31/2015  . INFLUENZA VACCINE  10/14/2016 (Originally 06/14/2016)  . HEMOGLOBIN A1C  11/23/2016  . PNA vac Low Risk Adult  Completed    Medications:   Medication List       Accurate as of 08/03/16  1:01 PM. Always use your most recent med list.          aspirin 81 MG chewable tablet Chew 1 tablet (81 mg total) by mouth daily.   bisacodyl 5 MG EC tablet Commonly known as:  DULCOLAX Take 5 mg by mouth daily as needed for moderate constipation.   bisoprolol 5 MG tablet Commonly  known as:  ZEBETA Take 0.5 tablets (2.5 mg total) by mouth daily.   furosemide 20 MG tablet Commonly known as:  LASIX Take 1 tablet (20 mg total) by mouth daily.   HYDROcodone-acetaminophen 5-325 MG tablet Commonly known as:  NORCO/VICODIN Take 1 tablet by mouth every 6 (six) hours as needed for moderate pain. DNE 3gm APAP in 24 hours from all sources   losartan 25 MG tablet Commonly known as:  COZAAR Take 0.5 tablets (12.5 mg total) by mouth daily.   metFORMIN 500 MG tablet Commonly known as:  GLUCOPHAGE 1/2 tablet twice a day   nortriptyline 10 MG capsule Commonly known as:  PAMELOR TAKE 1 TO 2 CAPSULES AT BEDTIME FOR BURNING IN FEET.   rosuvastatin 5 MG tablet Commonly known as:  CRESTOR Take 1 tablet (5 mg total) by mouth daily.   spironolactone 25 MG tablet Commonly known as:  ALDACTONE Take 0.5 tablets (12.5 mg total) by mouth daily.   traZODone 100 MG tablet Commonly known as:  DESYREL Take 1 tablet (100 mg total) by mouth at bedtime.   warfarin 6 MG tablet Commonly known as:  COUMADIN Give 6 mg along with 1 mg to equal 7 mg by mouth daily   warfarin 1 MG  tablet Commonly known as:  COUMADIN Give 1 mg along with 6 mg to equal 7 mg daily       Review of Systems  Vitals:   08/03/16 1147  BP: 118/72  Pulse: 76  Resp: 18  Temp: 97.7 F (36.5 C)  TempSrc: Oral   There is no height or weight on file to calculate BMI. Physical Exam  Labs reviewed: Basic Metabolic Panel:  Recent Labs  07/20/16 0355 07/20/16 1841  07/22/16 0500 07/23/16 0411 07/25/16 0655 08/01/16 0550  NA 133*  --   < > 134* 134* 136 136  K 3.9  --   < > 3.9 4.1 3.7 4.0  CL 98*  --   < > 100* 101 103 102  CO2 24  --   < > 26 26 26 27   GLUCOSE 132*  --   < > 148* 140* 127* 131*  BUN 8  --   < > 10 11 19 20   CREATININE 1.06 1.20  < > 1.03 1.03 0.89 1.00  CALCIUM 7.8*  --   < > 8.3* 8.5* 8.4* 8.9  MG 2.1 1.9  --  1.8  --   --   --   < > = values in this interval not displayed. Liver Function Tests:  Recent Labs  05/09/16 1357 06/28/16 1316 08/01/16 0550  AST 15 24 16   ALT 38 27 16*  ALKPHOS 77 72 67  BILITOT 0.9 1.7* 0.3  PROT 6.5 7.1 6.2*  ALBUMIN 3.9 3.9 3.3*   No results for input(s): LIPASE, AMYLASE in the last 8760 hours. No results for input(s): AMMONIA in the last 8760 hours. CBC:  Recent Labs  06/09/16 1654 06/28/16 1316  07/21/16 0440 07/22/16 0500 07/25/16 0655  WBC 7.2 6.8  < > 5.8 9.1 7.0  NEUTROABS 4,896 5.1  --   --   --  4.9  HGB 15.2 15.2  < > 10.1* 10.2* 11.0*  HCT 46.5 44.6  < > 31.1* 30.5* 32.9*  MCV 89.6 89.6  < > 90.7 88.9 89.9  PLT 256 266  < > 123* 153 213  < > = values in this interval not displayed. Cardiac Enzymes:  Recent Labs  08/20/15 1745 05/03/16  1305 05/28/16 1617  TROPONINI <0.03 0.03 <0.03   BNP: Invalid input(s): POCBNP CBG:  Recent Labs  07/22/16 2050 07/23/16 0629 07/23/16 1134  GLUCAP 130* 130* 163*    Procedures and Imaging Studies During Stay: Dg Chest 2 View  Result Date: 07/23/2016 CLINICAL DATA:  Atelectasis, congestive heart failure. EXAM: CHEST  2 VIEW COMPARISON:   Radiograph of July 20, 2016. FINDINGS: Stable cardiomediastinal silhouette. Right internal jugular catheter has been removed. Right-sided PICC line is unchanged in position. No pneumothorax or pleural effusion is noted. Status post aortic valve repair. Minimal bilateral pleural effusions are noted. Stable mild bibasilar subsegmental atelectasis or edema is noted. IMPRESSION: Stable mild bibasilar subsegmental atelectasis or edema. Minimal bilateral pleural effusions are noted. Electronically Signed   By: Marijo Conception, M.D.   On: 07/23/2016 09:28   Ct Cardiac Morph/pulm Vein W/cm&w/o Ca Score  Addendum Date: 07/07/2016   ADDENDUM REPORT: 07/07/2016 09:31 EXAM: OVER-READ INTERPRETATION  CT CHEST The following report is an over-read performed by radiologist Dr. Barnetta Hammersmith [facility] on 07/07/2016. This over-read does not include interpretation of cardiac or coronary anatomy or pathology. The coronary CTA/TAVR CTA interpretation by the cardiologist is attached. COMPARISON:  None. FINDINGS: Interstitial edema present with moderate sized right pleural effusion and tiny left pleural effusion. No incidental pulmonary nodules or enlarged lymph nodes identified. Atherosclerosis of the aortic arch and descending thoracic aorta present without evidence of aneurysmal disease. Proximal great vessels show normal branching anatomy and no significant obstructive disease. Visualized bony structures are unremarkable. Visualized upper abdominal structures are unremarkable. IMPRESSION: Interstitial pulmonary edema with moderate right pleural effusion and small left pleural effusion. Electronically Signed   By: Aletta Edouard M.D.   On: 07/07/2016 09:31   Result Date: 07/07/2016 CLINICAL DATA:  87-yaer-old male with severe aortic stenosis. EXAM: Cardiac TAVR CT TECHNIQUE: The patient was scanned on a Philips 256 scanner. A 120 kV retrospective scan was triggered in the descending thoracic aorta at 111  HU's. Gantry rotation speed was 270 msecs and collimation was .9 mm. 10 mg of iv Metoprolol and no nitro were given. The 3D data set was reconstructed in 5% intervals of the R-R cycle. Systolic and diastolic phases were analyzed on a dedicated work station using MPR, MIP and VRT modes. The patient received 80 cc of contrast. FINDINGS: Aortic Valve: Trileaflet, severely thickened and calcified with severely restricted leaflet opening. There are only mild calcifications extending into the LVOT. Aorta: Normal caliber, moderate diffuse calcifications and severe atherosclerosis on the aortic arch and descending thoracic aorta. No dissection. Sinotubular Junction:  27 x 27 mm Ascending Thoracic Aorta:  32 x 30 mm Aortic Arch:  27 x 27 mm Descending Thoracic Aorta:  28 x 27 mm Sinus of Valsalva Measurements: Non-coronary:  33 mm Right -coronary:  33 mm Left -coronary:  32 mm Coronary Artery Height above Annulus: Left Main:  13 mm Right Coronary:  14 mm Virtual Basal Annulus Measurements: Maximum/Minimum Diameter:  29 x 24 mm Perimeter:  92 mm Area:  574 mm2 Optimum Fluoroscopic Angle for Delivery:  RAO 6 CRA 4 Other findings: There is a very large left atrial appendage with no evidence of a thrombus. Normal pulmonary vein drainage into the left atrium. Dilated pulmonary artery measuring 32 x 29 mm suggestive of pulmonary hypertension. IMPRESSION: 1. Trileaflet, severely thickened and calcified with severely restricted leaflet opening and annular measurements suitable for delivery of 29 mm Edward-SAPIEN TAVR valve. 2. Sufficient coronaries to annulus distance.  3. Optimum Fluoroscopic Angle for Delivery:  RAO 6 CRA 4 Ena Dawley Electronically Signed: By: Ena Dawley On: 07/05/2016 19:58   Dg Chest Port 1 View  Result Date: 07/20/2016 CLINICAL DATA:  Chest pain and shortness of breath EXAM: PORTABLE CHEST 1 VIEW COMPARISON:  July 19, 2016 FINDINGS: Swan-Ganz catheter tip is in the proximal right intralobar  pulmonary artery. Right subclavian catheter tip is at the cavoatrial junction. No pneumothorax. There has been significant partial clearing of interstitial edema. Only a slight amount of interstitial edema remains. There is no airspace consolidation. Heart is borderline enlarged with mild pulmonary venous hypertension. Prosthetic valve unchanged in position. There is aortic atherosclerosis. IMPRESSION: Significant clearing of interstitial edema with only mild interstitial edema remaining. No new opacity. There remains a degree of pulmonary venous hypertension. Catheter positions are unchanged without pneumothorax. Electronically Signed   By: Lowella Grip III M.D.   On: 07/20/2016 07:40   Dg Chest Port 1 View  Result Date: 07/19/2016 CLINICAL DATA:  Status post aortic valve replacement.  CHF. EXAM: PORTABLE CHEST 1 VIEW COMPARISON:  Portable chest x-ray of June 30, 2016 FINDINGS: The lungs are mildly hypoinflated. The pulmonary vascularity is engorged and the interstitial markings are increased diffusely. The cardiac silhouette is enlarged. The Swan-Ganz catheter tip projects over proximal main pulmonary artery on the right. The right-sided PICC line tip projects over the distal third of the SVC. There is no definite pleural effusion. The aortic valve cage is visible. IMPRESSION: Congestive heart failure with pulmonary interstitial edema. No alveolar pneumonia. The Swan-Ganz catheter tip may be wedged in a proximal main pulmonary artery branch on the right. Electronically Signed   By: David  Martinique M.D.   On: 07/19/2016 11:27   Ct Angio Chest Aorta W/cm &/or Wo/cm  Result Date: 07/11/2016 CLINICAL DATA:  82 year old male with history of severe aortic stenosis. Preprocedural study prior to potential transcatheter aortic valve replacement (TAVR) procedure. EXAM: CT ANGIOGRAPHY CHEST, ABDOMEN AND PELVIS TECHNIQUE: Multidetector CT imaging through the chest, abdomen and pelvis was performed using the  standard protocol during bolus administration of intravenous contrast. Multiplanar reconstructed images and MIPs were obtained and reviewed to evaluate the vascular anatomy. CONTRAST:  65 mL of Isovue 370. COMPARISON:  CT the abdomen and pelvis 06/24/2010. FINDINGS: CTA CHEST FINDINGS Cardiovascular: Heart size is mildly enlarged with left atrial dilatation. There is no significant pericardial fluid, thickening or pericardial calcification. There is aortic atherosclerosis, as well as atherosclerosis of the great vessels of the mediastinum and the coronary arteries, including calcified atherosclerotic plaque in the left main, left anterior descending, left circumflex and right coronary arteries. Severe thickening calcifications of the aortic valve. Right upper extremity PICC with tip terminating at the superior cavoatrial junction. Mediastinum/Nodes: No pathologically enlarged mediastinal or hilar lymph nodes. Esophagus is unremarkable in appearance. No axillary lymphadenopathy. Lungs/Pleura: Moderate to large right and trace left pleural effusions lie dependently. Significant regions of passive subsegmental atelectasis are noted in the lower lobes of the lungs bilaterally (right greater than left). No acute consolidative airspace disease. No pleural effusions. There are several small pulmonary nodules in the lungs bilaterally. The largest solid-appearing pulmonary nodule is a 4 mm subpleural nodule associated with the major fissure in the anterior aspect of the superior segment of the right lower lobe (image 46 of series 407). There is also a 1.6 x 1.7 cm ground-glass attenuation nodule with tiny 5 mm central solid component in the apex of the right upper lobe (image 26  of series 407 and image 33 of series 401). Mild centrilobular and paraseptal emphysema noted predominantly in the lung apices. Musculoskeletal: Well-defined sclerotic lesion in the posterior aspect of the right T8 rib is similar to the prior study  from 06/24/2010, presumably a bone island. There are no aggressive appearing lytic or blastic lesions noted in the visualized portions of the skeleton. CTA ABDOMEN AND PELVIS FINDINGS Hepatobiliary: The liver has a shrunken appearance and slightly nodular contour, compatible with early changes of cirrhosis. No discrete cystic or solid hepatic lesions are noted. No intra or extrahepatic biliary ductal dilatation. Gallbladder is normal in appearance. Pancreas: No pancreatic mass. No pancreatic ductal dilatation. No pancreatic or peripancreatic fluid or inflammatory changes. Spleen: Unremarkable. Adrenals/Urinary Tract: Bilateral adrenal glands are normal in appearance. Exophytic 3.5 cm low-attenuation lesion in the upper pole the left kidney is only slightly larger than the prior examination from 06/24/2010, compatible with a simple cyst. Other sub cm low-attenuation lesions in the kidneys bilaterally are too small to definitively characterize, but also favored to represent tiny cysts. There is a 2.3 cm intermediate attenuation (26-34 HU) lesion in the lateral aspect of the right kidney at the junction of the interpolar and lower pole regions which is indeterminate, but new compared to the prior study. No hydroureteronephrosis. Urinary bladder is generally unremarkable in appearance, although there is a small amount of gas non dependently in the bladder, presumably related to the indwelling Foley catheter. Stomach/Bowel: The appearance of the stomach is normal. There is no pathologic dilatation of small bowel or colon. Numerous colonic diverticulae are noted, particularly in the proximal sigmoid colon, without surrounding inflammatory changes to suggest an acute diverticulitis at this time. Normal appendix. Vascular/Lymphatic: Extensive atherosclerosis is noted throughout the abdominal and pelvic vasculature, with vascular findings and measurements pertinent to potential TAVR procedure, as detailed below. No aneurysm  or dissection is noted in the abdominal or pelvic vasculature. Single left renal artery. Tiny accessory renal artery to the lower pole of the right kidney noted. Renal arteries are patent bilaterally. Celiac axis and major branches appear widely patent. Mild stenosis in the proximal superior mesenteric artery, which is otherwise widely patent. Inferior mesenteric artery appears widely patent. Reproductive: Prostate gland and seminal vesicles are unremarkable in appearance. Other: No significant volume of ascites.  No pneumoperitoneum. Musculoskeletal: Chronic appearing compression fracture at L2 with approximately 40% loss of central vertebral body height, only slightly increased compared to prior examinations. There are no aggressive appearing lytic or blastic lesions noted in the visualized portions of the skeleton. VASCULAR MEASUREMENTS PERTINENT TO TAVR: AORTA: Minimal Aortic Diameter -  17 x 12 mm Severity of Aortic Calcification -  moderate to severe RIGHT PELVIS: Right Common Iliac Artery - Minimal Diameter - 9.2 x 9.2 mm Tortuosity - moderate Calcification - mild Right External Iliac Artery - Minimal Diameter - 6.9 x 6.5 mm Tortuosity - moderate to severe Calcification - none Right Common Femoral Artery - Minimal Diameter - 7.3 x 4.9 mm Tortuosity - mild Calcification - moderate LEFT PELVIS: Left Common Iliac Artery - Minimal Diameter - 9.7 x 9.7 mm Tortuosity - minimal Calcification - moderate Left External Iliac Artery - Minimal Diameter - 6.8 x 6.0 mm Tortuosity - mild to moderate Calcification - none Left Common Femoral Artery - Minimal Diameter - 7.6 x 5.4 mm Tortuosity - mild Calcification - mild Review of the MIP images confirms the above findings. IMPRESSION: 1. Vascular findings and measurements pertinent to potential TAVR procedure, as detailed above.  The patient does appear to have suitable pelvic arterial access, particularly on the left side. 2. Thickened and heavily calcified aortic valve,  compatible with the reported clinical history of severe aortic stenosis. 3. Cardiomegaly with left atrial dilatation. 4. Aortic atherosclerosis, in addition to left main and 3 vessel coronary artery disease. 5. **An incidental finding of potential clinical significance has been found. 2.3 cm intermediate attenuation lesion at the junction of the lower pole and interpolar region of the right kidney is new compared to the prior study, and considered indeterminate. Further characterization with nonemergent MRI of the abdomen with and without IV gadolinium is recommended in the near future to provide definitive characterization of this lesion.** 6. Morphologic changes in the liver suggestive of early cirrhosis. 7. Extensive colonic diverticulosis without evidence to suggest an acute diverticulitis at this time. 8. Additional incidental findings, as above. Electronically Signed   By: Vinnie Langton M.D.   On: 07/11/2016 16:08   Ct Angio Abd/pel W/ And/or W/o  Result Date: 07/11/2016 CLINICAL DATA:  82 year old male with history of severe aortic stenosis. Preprocedural study prior to potential transcatheter aortic valve replacement (TAVR) procedure. EXAM: CT ANGIOGRAPHY CHEST, ABDOMEN AND PELVIS TECHNIQUE: Multidetector CT imaging through the chest, abdomen and pelvis was performed using the standard protocol during bolus administration of intravenous contrast. Multiplanar reconstructed images and MIPs were obtained and reviewed to evaluate the vascular anatomy. CONTRAST:  65 mL of Isovue 370. COMPARISON:  CT the abdomen and pelvis 06/24/2010. FINDINGS: CTA CHEST FINDINGS Cardiovascular: Heart size is mildly enlarged with left atrial dilatation. There is no significant pericardial fluid, thickening or pericardial calcification. There is aortic atherosclerosis, as well as atherosclerosis of the great vessels of the mediastinum and the coronary arteries, including calcified atherosclerotic plaque in the left main,  left anterior descending, left circumflex and right coronary arteries. Severe thickening calcifications of the aortic valve. Right upper extremity PICC with tip terminating at the superior cavoatrial junction. Mediastinum/Nodes: No pathologically enlarged mediastinal or hilar lymph nodes. Esophagus is unremarkable in appearance. No axillary lymphadenopathy. Lungs/Pleura: Moderate to large right and trace left pleural effusions lie dependently. Significant regions of passive subsegmental atelectasis are noted in the lower lobes of the lungs bilaterally (right greater than left). No acute consolidative airspace disease. No pleural effusions. There are several small pulmonary nodules in the lungs bilaterally. The largest solid-appearing pulmonary nodule is a 4 mm subpleural nodule associated with the major fissure in the anterior aspect of the superior segment of the right lower lobe (image 46 of series 407). There is also a 1.6 x 1.7 cm ground-glass attenuation nodule with tiny 5 mm central solid component in the apex of the right upper lobe (image 26 of series 407 and image 33 of series 401). Mild centrilobular and paraseptal emphysema noted predominantly in the lung apices. Musculoskeletal: Well-defined sclerotic lesion in the posterior aspect of the right T8 rib is similar to the prior study from 06/24/2010, presumably a bone island. There are no aggressive appearing lytic or blastic lesions noted in the visualized portions of the skeleton. CTA ABDOMEN AND PELVIS FINDINGS Hepatobiliary: The liver has a shrunken appearance and slightly nodular contour, compatible with early changes of cirrhosis. No discrete cystic or solid hepatic lesions are noted. No intra or extrahepatic biliary ductal dilatation. Gallbladder is normal in appearance. Pancreas: No pancreatic mass. No pancreatic ductal dilatation. No pancreatic or peripancreatic fluid or inflammatory changes. Spleen: Unremarkable. Adrenals/Urinary Tract: Bilateral  adrenal glands are normal in appearance. Exophytic 3.5 cm  low-attenuation lesion in the upper pole the left kidney is only slightly larger than the prior examination from 06/24/2010, compatible with a simple cyst. Other sub cm low-attenuation lesions in the kidneys bilaterally are too small to definitively characterize, but also favored to represent tiny cysts. There is a 2.3 cm intermediate attenuation (26-34 HU) lesion in the lateral aspect of the right kidney at the junction of the interpolar and lower pole regions which is indeterminate, but new compared to the prior study. No hydroureteronephrosis. Urinary bladder is generally unremarkable in appearance, although there is a small amount of gas non dependently in the bladder, presumably related to the indwelling Foley catheter. Stomach/Bowel: The appearance of the stomach is normal. There is no pathologic dilatation of small bowel or colon. Numerous colonic diverticulae are noted, particularly in the proximal sigmoid colon, without surrounding inflammatory changes to suggest an acute diverticulitis at this time. Normal appendix. Vascular/Lymphatic: Extensive atherosclerosis is noted throughout the abdominal and pelvic vasculature, with vascular findings and measurements pertinent to potential TAVR procedure, as detailed below. No aneurysm or dissection is noted in the abdominal or pelvic vasculature. Single left renal artery. Tiny accessory renal artery to the lower pole of the right kidney noted. Renal arteries are patent bilaterally. Celiac axis and major branches appear widely patent. Mild stenosis in the proximal superior mesenteric artery, which is otherwise widely patent. Inferior mesenteric artery appears widely patent. Reproductive: Prostate gland and seminal vesicles are unremarkable in appearance. Other: No significant volume of ascites.  No pneumoperitoneum. Musculoskeletal: Chronic appearing compression fracture at L2 with approximately 40% loss of  central vertebral body height, only slightly increased compared to prior examinations. There are no aggressive appearing lytic or blastic lesions noted in the visualized portions of the skeleton. VASCULAR MEASUREMENTS PERTINENT TO TAVR: AORTA: Minimal Aortic Diameter -  17 x 12 mm Severity of Aortic Calcification -  moderate to severe RIGHT PELVIS: Right Common Iliac Artery - Minimal Diameter - 9.2 x 9.2 mm Tortuosity - moderate Calcification - mild Right External Iliac Artery - Minimal Diameter - 6.9 x 6.5 mm Tortuosity - moderate to severe Calcification - none Right Common Femoral Artery - Minimal Diameter - 7.3 x 4.9 mm Tortuosity - mild Calcification - moderate LEFT PELVIS: Left Common Iliac Artery - Minimal Diameter - 9.7 x 9.7 mm Tortuosity - minimal Calcification - moderate Left External Iliac Artery - Minimal Diameter - 6.8 x 6.0 mm Tortuosity - mild to moderate Calcification - none Left Common Femoral Artery - Minimal Diameter - 7.6 x 5.4 mm Tortuosity - mild Calcification - mild Review of the MIP images confirms the above findings. IMPRESSION: 1. Vascular findings and measurements pertinent to potential TAVR procedure, as detailed above. The patient does appear to have suitable pelvic arterial access, particularly on the left side. 2. Thickened and heavily calcified aortic valve, compatible with the reported clinical history of severe aortic stenosis. 3. Cardiomegaly with left atrial dilatation. 4. Aortic atherosclerosis, in addition to left main and 3 vessel coronary artery disease. 5. **An incidental finding of potential clinical significance has been found. 2.3 cm intermediate attenuation lesion at the junction of the lower pole and interpolar region of the right kidney is new compared to the prior study, and considered indeterminate. Further characterization with nonemergent MRI of the abdomen with and without IV gadolinium is recommended in the near future to provide definitive characterization of  this lesion.** 6. Morphologic changes in the liver suggestive of early cirrhosis. 7. Extensive colonic diverticulosis without evidence to  suggest an acute diverticulitis at this time. 8. Additional incidental findings, as above. Electronically Signed   By: Vinnie Langton M.D.   On: 07/11/2016 16:08    Assessment/Plan:   There are no diagnoses linked to this encounter.   Patient is being discharged with the following home health services:    Patient is being discharged with the following durable medical equipment:    Patient has been advised to f/u with their PCP in 1-2 weeks to bring them up to date on their rehab stay.  Social services at facility was responsible for arranging this appointment.  Pt was provided with a 30 day supply of prescriptions for medications and refills must be obtained from their PCP.  For controlled substances, a more limited supply may be provided adequate until PCP appointment only.  Future labs/tests needed:      This encounter was created in error - please disregard.

## 2016-08-05 ENCOUNTER — Encounter: Payer: Self-pay | Admitting: Internal Medicine

## 2016-08-05 ENCOUNTER — Encounter (HOSPITAL_COMMUNITY)
Admission: AD | Admit: 2016-08-05 | Discharge: 2016-08-05 | Disposition: A | Discharge status: Home / Self Care | Payer: Medicare Other | Source: Skilled Nursing Facility | Attending: *Deleted | Admitting: *Deleted

## 2016-08-05 LAB — PROTIME-INR
INR: 2.86
Prothrombin Time: 30.6 seconds — ABNORMAL HIGH (ref 11.4–15.2)

## 2016-08-08 ENCOUNTER — Encounter (HOSPITAL_COMMUNITY)
Admission: RE | Admit: 2016-08-08 | Discharge: 2016-08-08 | Disposition: A | Discharge status: Home / Self Care | Payer: Medicare Other | Source: Skilled Nursing Facility | Attending: *Deleted | Admitting: *Deleted

## 2016-08-08 ENCOUNTER — Non-Acute Institutional Stay (SKILLED_NURSING_FACILITY): Payer: Medicare Other | Admitting: Internal Medicine

## 2016-08-08 DIAGNOSIS — H6191 Disorder of right external ear, unspecified: Secondary | ICD-10-CM | POA: Diagnosis not present

## 2016-08-08 LAB — PROTIME-INR
INR: 2.26
PROTHROMBIN TIME: 25.3 s — AB (ref 11.4–15.2)

## 2016-08-08 NOTE — Progress Notes (Signed)
This is an acute visit.  Level care skilled.  Facility [facility].  Chief complaint-acute visit secondary to right ear discomfort.  History of present illness.  Patient is a pleasant elderly male who states he is having some continued discomfort on his right upper earlobe.  The area at this point appears fairly benign appears to have almost a small pimple type area-he says this actually has been seen by dermatology couple times he says  continues to bother him.  I do not see any signs of cellulitis here edema erythema or drainage  Past Medical History:  Diagnosis Date  . Aortic stenosis    a. mod-sev by echo 05/2016.  . Asthma   . Cerebrovascular disease 07/2010   TIA; carotid ultrasound in 07/2010-significant bilateral plaque without focal internal carotid artery stenosis; MRI -encephalomalacia left temporal and right temporal lobes; small inferior right cerebellar infarct; small vessel disease  . Chronic atrial fibrillation (HCC)    Paroxysmal; Echocardiogram in 2007-normal EF; mild LVH; left atrial enlargement; mild stenosis and minimal AI; negative stress nuclear study in 2008  . Chronic systolic CHF (congestive heart failure) ([place])    a. dx 05/2016 - EF 35-40%, diffuse HK, mod-severe AS, mod gradient, severe AVA VTI likely due to decreased cardiac output in setting of systolic dysfsunction and significant mitral regurgitation, mild MR, mod-severe MR, severe LAE, mild-mod RV dilation, mild RAE, mild-mod TR, mod PASP 75mmHg.  . CKD (chronic kidney disease), stage II   . Degenerative joint disease    feet and legs  . Diabetes mellitus    no insulin; A1c of 6.6 in 2005  . Dizziness    occurs daily,especially in am  . Exertional dyspnea   . Gastroesophageal reflux disease   . Hepatic steatosis   . History of noncompliance with medical treatment   . Hyperlipidemia    adverse reactions to statins and niacin  . Hypertension    Borderline  . Irregular  heartbeat   . Mitral regurgitation    a. mod-sev by echo 05/2016  . Peripheral vascular disease ([place])   . Renal insufficiency   . S/P TAVR (transcatheter aortic valve replacement) 07/19/2016   29 mm Edwards Sapien 3 transcatheter heart valve placed via left percutaneous transfemoral approach  . Temporal arteritis ([place])   . Tricuspid regurgitation    a. mild-mod TR by echo 05/2016        Past Surgical History:  Procedure Laterality Date  . COLONOSCOPY  2002  . COLONOSCOPY  01/19/2012   Procedure: COLONOSCOPY;  Surgeon: Rogene Houston, MD;  Location: [facility];  Service: Endoscopy;  Laterality: N/A;  1030  . LIPOMA EXCISION  1980  . ORIF ANKLE FRACTURE  2000   Right  . PERIPHERAL VASCULAR CATHETERIZATION N/A 07/11/2016   Procedure: Carotid PTA/Stent Intervention;  Surgeon: Lorretta Harp, MD;  Location: [facility];  Service: Cardiovascular;  Laterality: N/A;  . PROSTATE SURGERY  12/2011  . ROTATOR CUFF REPAIR     Right  . TEE WITHOUT CARDIOVERSION N/A 06/17/2016   Procedure: TRANSESOPHAGEAL ECHOCARDIOGRAM (TEE);  Surgeon: Jerline Pain, MD;  Location: [facility];  Service: Cardiovascular;  Laterality: N/A;  . TEE WITHOUT CARDIOVERSION N/A 07/19/2016   Procedure: TRANSESOPHAGEAL ECHOCARDIOGRAM (TEE);  Surgeon: Sherren Mocha, MD;  Location: [facility];  Service: Open Heart Surgery;  Laterality: N/A;  . TRANSCATHETER AORTIC VALVE REPLACEMENT, TRANSFEMORAL N/A 07/19/2016   Procedure: TRANSCATHETER AORTIC VALVE REPLACEMENT, TRANSFEMORAL;  Surgeon: Sherren Mocha, MD;  Location: [facility]  OR;  Service: Open Heart Surgery;  Laterality: N/A;  . TRANSURETHRAL RESECTION OF PROSTATE  09/2011  . URETHRAL STRICTURE DILATATION  1980s    reports that he quit smoking about 24 years ago. His smoking use included Cigarettes. He started smoking about 66 years ago. He has a 20.00 pack-year smoking history. His smokeless tobacco use includes Chew. He reports that he does not drink  alcohol or use drugs. Social History        Social History  . Marital status: Married    Spouse name: N/A  . Number of children: 1  . Years of education: N/A       Occupational History  . Retired         Social History Main Topics  . Smoking status: Former Smoker    Packs/day: 1.00    Years: 20.00    Types: Cigarettes    Start date: 07/04/1950    Quit date: 04/25/1992  . Smokeless tobacco: Current User    Types: Chew  . Alcohol use No  . Drug use: No  . Sexual activity: Not Currently       Other Topics Concern  . Not on file      Social History Narrative  . No narrative on file    Functional Status Survey:         Family History  Problem Relation Age of Onset  . Stroke Mother   . Diabetes Father   . Colon cancer Neg Hx            Allergies  Allergen Reactions  . Ambien [Zolpidem Tartrate] Other (See Comments)    Sleep walks  . Lipitor [Atorvastatin Calcium] Other (See Comments)    myalgias  . Ranitidine Other (See Comments)    Chest discomfort  . Simvastatin Other (See Comments)    Myalgias  . Xanax Xr [Alprazolam Er] Other (See Comments)    Tightness in chest  . Cholestatin Other (See Comments)    UNSPECIFIED REACTION   . Clopidogrel Bisulfate Rash          Medication List           Accurate as of 07/25/16  3:14 PM. Always use your most recent med list.           aspirin 81 MG chewable tablet Chew 1 tablet (81 mg total) by mouth daily.  bisoprolol 5 MG tablet Commonly known as:  ZEBETA Take 0.5 tablets (2.5 mg total) by mouth daily.  furosemide 20 MG tablet Commonly known as:  LASIX Take 1 tablet (20 mg total) by mouth daily.  HYDROcodone-acetaminophen 5-325 MG tablet Commonly known as:  NORCO/VICODIN Take 1 tablet by mouth every 6 (six) hours as needed for moderate pain. DNE 3gm APAP in 24 hours from all sources  losartan 25 MG tablet Commonly known as:  COZAAR Take 0.5  tablets (12.5 mg total) by mouth daily.  metFORMIN 500 MG tablet Commonly known as:  GLUCOPHAGE 1/2 tablet twice a day  nortriptyline 10 MG capsule Commonly known as:  PAMELOR TAKE 1 TO 2 CAPSULES AT BEDTIME FOR BURNING IN FEET.  rosuvastatin 5 MG tablet Commonly known as:  CRESTOR Take 1 tablet (5 mg total) by mouth daily.  spironolactone 25 MG tablet Commonly known as:  ALDACTONE Take 0.5 tablets (12.5 mg total) by mouth daily.  traZODone 100 MG tablet Commonly known as:  DESYREL Take 1 tablet (100 mg total) by mouth at bedtime.  warfarin 7 MG tablet Commonly known  as:  COUMADIN Take 1 tablet (7 mg total) by mouth daily at 6 PM.     Physical exam.  Right ear appears to be within normal range there is a very small almost pimple  raised type area on the upper outer earlobe- There is some slight tenderness to palpation of the area-I do not see any surrounding erythema drainage or edema.  Assessment and plan.  Small earlobe lesion-apparently this is somewhat chronic and has been seen by dermatology-area continues to irritate patient however will reorder dermatology consult.  [id number]

## 2016-08-09 ENCOUNTER — Ambulatory Visit (HOSPITAL_COMMUNITY)
Admission: RE | Admit: 2016-08-09 | Discharge: 2016-08-09 | Disposition: A | Discharge status: Home / Self Care | Payer: No Typology Code available for payment source | Source: Ambulatory Visit | Attending: Internal Medicine | Admitting: Internal Medicine

## 2016-08-09 ENCOUNTER — Encounter (HOSPITAL_COMMUNITY): Payer: Self-pay | Admitting: Internal Medicine

## 2016-08-09 VITALS — BP 108/66 | HR 74 | Wt 200.2 lb

## 2016-08-09 DIAGNOSIS — I5022 Chronic systolic (congestive) heart failure: Secondary | ICD-10-CM

## 2016-08-09 DIAGNOSIS — I2510 Atherosclerotic heart disease of native coronary artery without angina pectoris: Secondary | ICD-10-CM | POA: Insufficient documentation

## 2016-08-09 DIAGNOSIS — I352 Nonrheumatic aortic (valve) stenosis with insufficiency: Secondary | ICD-10-CM | POA: Insufficient documentation

## 2016-08-09 DIAGNOSIS — I4891 Unspecified atrial fibrillation: Secondary | ICD-10-CM

## 2016-08-09 DIAGNOSIS — Z79899 Other long term (current) drug therapy: Secondary | ICD-10-CM | POA: Insufficient documentation

## 2016-08-09 DIAGNOSIS — I6521 Occlusion and stenosis of right carotid artery: Secondary | ICD-10-CM | POA: Diagnosis not present

## 2016-08-09 DIAGNOSIS — Z954 Presence of other heart-valve replacement: Secondary | ICD-10-CM

## 2016-08-09 DIAGNOSIS — I6529 Occlusion and stenosis of unspecified carotid artery: Secondary | ICD-10-CM | POA: Insufficient documentation

## 2016-08-09 DIAGNOSIS — I130 Hypertensive heart and chronic kidney disease with heart failure and stage 1 through stage 4 chronic kidney disease, or unspecified chronic kidney disease: Secondary | ICD-10-CM | POA: Diagnosis not present

## 2016-08-09 DIAGNOSIS — I350 Nonrheumatic aortic (valve) stenosis: Secondary | ICD-10-CM | POA: Diagnosis not present

## 2016-08-09 DIAGNOSIS — Z7984 Long term (current) use of oral hypoglycemic drugs: Secondary | ICD-10-CM | POA: Insufficient documentation

## 2016-08-09 DIAGNOSIS — N182 Chronic kidney disease, stage 2 (mild): Secondary | ICD-10-CM | POA: Insufficient documentation

## 2016-08-09 DIAGNOSIS — E785 Hyperlipidemia, unspecified: Secondary | ICD-10-CM | POA: Insufficient documentation

## 2016-08-09 DIAGNOSIS — Z87891 Personal history of nicotine dependence: Secondary | ICD-10-CM | POA: Insufficient documentation

## 2016-08-09 DIAGNOSIS — I482 Chronic atrial fibrillation: Secondary | ICD-10-CM | POA: Diagnosis not present

## 2016-08-09 DIAGNOSIS — Z7901 Long term (current) use of anticoagulants: Secondary | ICD-10-CM | POA: Diagnosis not present

## 2016-08-09 DIAGNOSIS — Z7982 Long term (current) use of aspirin: Secondary | ICD-10-CM | POA: Insufficient documentation

## 2016-08-09 DIAGNOSIS — Z952 Presence of prosthetic heart valve: Secondary | ICD-10-CM

## 2016-08-09 MED ORDER — SPIRONOLACTONE 25 MG PO TABS: 25.0000 mg | ORAL_TABLET | Freq: Every day | ORAL | Status: DC

## 2016-08-09 NOTE — Patient Instructions (Signed)
Increase Spironolactone to 25 mg daily  Labs in 1 week at facility  Your physician recommends that you schedule a follow-up appointment in: 6 weeks

## 2016-08-09 NOTE — Progress Notes (Signed)
ADVANCED HF CLINIC NOTE  Primary Cardiologist: Dr Harl Bowie   HPI:  [name] [name] is an 82 year old male with aortic stenosis, chronic atrial fibrillation, hypertension, systolic HF, type II diabetes mellitus, hyperlipidemia, and cerebrovascular disease who underwent recent R CEA and TAVR (/03/2016).   He presents for post-hospital f/u.   He had echo on 06/01/2016 which revealed severe low gradient AS with LVEF 35% low ejection fraction aortic stenosis. TEE revealed moderate to severe left ventricular systolic dysfunction with ejection fraction estimated 30-35%. There was severe aortic stenosis with mild aortic insufficiency. The dimensionless velocity ratio was measured 0.2 with peak velocity across the aortic valve measured 3.2 m/s corresponding to mean transvalvular gradient estimated 22 mmHg.Diagnostic cardiac catheterization revealed moderate coronary artery disease with 75% stenosis of the mid left anterior descending coronary artery, 80% stenosis of the distal left circumflex coronary artery, and otherwise moderate nonobstructive disease. Attempts to cross the aortic valve were unsuccessful at catheterization. Right heart catheterization revealed moderate pulmonary hypertension with PA pressures measured 52/23, mean pulmonary A wedge pressure measured 25 mmHg, central venous pressure 11 mmHg, and lowcardiac output (3.2 L/m) and mixed venous oxygen saturation (52%).    He presented acutely to the emergency department 06/28/2016 with severe respiratory distress due to cardiogenic shock and rapid AF. He was admitted to the floor and the transferred to the CCU. He was treated with milrinone, IV amiodarone and lasix. Work-up also revealed high-grade R carotid stenosis He was seen by TCTS and not felt to be candidate for AVR/CABG. VVS also consulted. He eventually underwent R carotid stent followed a week later by successful TAVR by [name]. Milrinone was weaned and he was ambulated by PT and  cardiac rehab. He was discharged to [facility] His course was complicated by hematuria which resolved with abx and  conservative treatment.  He returns for post hospital follow up. Overall feeling pretty good. Getting stronger. Ambulates with rolling walker. Currently at [facility] for rehab. Weight at SNF  209 >202 pounds. Working with PT/OT. Denies CP or SOB or edema.. No excess bleeding with warfarin.    ROS: All systems negative except as listed in HPI, PMH and Problem List. No current facility-administered medications on file prior to encounter.    Current Outpatient Prescriptions on File Prior to Encounter  Medication Sig Dispense Refill  . aspirin 81 MG chewable tablet Chew 1 tablet (81 mg total) by mouth daily.    . bisacodyl (DULCOLAX) 5 MG EC tablet Take 5 mg by mouth daily as needed for moderate constipation.    . bisoprolol (ZEBETA) 5 MG tablet Take 0.5 tablets (2.5 mg total) by mouth daily.    . furosemide (LASIX) 20 MG tablet Take 1 tablet (20 mg total) by mouth daily. 30 tablet   . HYDROcodone-acetaminophen (NORCO/VICODIN) 5-325 MG tablet Take 1 tablet by mouth every 6 (six) hours as needed for moderate pain. DNE 3gm APAP in 24 hours from all sources 120 tablet 0  . losartan (COZAAR) 25 MG tablet Take 0.5 tablets (12.5 mg total) by mouth daily.    . metFORMIN (GLUCOPHAGE) 500 MG tablet 1/2 tablet twice a day 30 tablet 5  . nortriptyline (PAMELOR) 10 MG capsule TAKE 1 TO 2 CAPSULES AT BEDTIME FOR BURNING IN FEET. 60 capsule 5  . rosuvastatin (CRESTOR) 5 MG tablet Take 1 tablet (5 mg total) by mouth daily.    [name] spironolactone (ALDACTONE) 25 MG tablet Take 0.5 tablets (12.5 mg total) by mouth daily.    [name]  traZODone (DESYREL) 100 MG tablet Take 1 tablet (100 mg total) by mouth at bedtime. 14 tablet 0  . warfarin (COUMADIN) 1 MG tablet Give 1 mg along with 6 mg to equal 7 mg daily    . warfarin (COUMADIN) 6 MG tablet Give 6 mg along with 1 mg to equal 7 mg by mouth daily      SH:  Social History   Social History  . Marital status: Married    Spouse name: N/A  . Number of children: 1  . Years of education: N/A   Occupational History  . Retired    Social History Main Topics  . Smoking status: Former Smoker    Packs/day: 1.00    Years: 20.00    Types: Cigarettes    Start date: 07/04/1950    Quit date: 04/25/1992  . Smokeless tobacco: Current User    Types: Chew  . Alcohol use No  . Drug use: No  . Sexual activity: Not Currently   Other Topics Concern  . Not on file   Social History Narrative  . No narrative on file    FH:  Family History  Problem Relation Age of Onset  . Stroke Mother   . Diabetes Father   . Colon cancer Neg Hx     Past Medical History:  Diagnosis Date  . Aortic stenosis    a. mod-sev by echo 05/2016.  . Asthma   . Cerebrovascular disease 07/2010   TIA; carotid ultrasound in 07/2010-significant bilateral plaque without focal internal carotid artery stenosis; MRI -encephalomalacia left temporal and right temporal lobes; small inferior right cerebellar infarct; small vessel disease  . Chronic atrial fibrillation (HCC)    Paroxysmal; Echocardiogram in 2007-normal EF; mild LVH; left atrial enlargement; mild stenosis and minimal AI; negative stress nuclear study in 2008  . Chronic systolic CHF (congestive heart failure) ([place])    a. dx 05/2016 - EF 35-40%, diffuse HK, mod-severe AS, mod gradient, severe AVA VTI likely due to decreased cardiac output in setting of systolic dysfsunction and significant mitral regurgitation, mild MR, mod-severe MR, severe LAE, mild-mod RV dilation, mild RAE, mild-mod TR, mod PASP 10mmHg.  . CKD (chronic kidney disease), stage II   . Degenerative joint disease    feet and legs  . Diabetes mellitus    no insulin; A1c of 6.6 in 2005  . Dizziness    occurs daily,especially in am  . Exertional dyspnea   . Gastroesophageal reflux disease   . Hepatic steatosis   . History of noncompliance with  medical treatment   . Hyperlipidemia    adverse reactions to statins and niacin  . Hypertension    Borderline  . Irregular heartbeat   . Mitral regurgitation    a. mod-sev by echo 05/2016  . Peripheral vascular disease ([place])   . Renal insufficiency   . S/P TAVR (transcatheter aortic valve replacement) 07/19/2016   29 mm Edwards Sapien 3 transcatheter heart valve placed via left percutaneous transfemoral approach  . Temporal arteritis ([place])   . Tricuspid regurgitation    a. mild-mod TR by echo 05/2016     Vitals:   08/09/16 1327  BP: 108/66  Pulse: 74  SpO2: 100%  Weight: 200 lb 4 oz (90.8 kg)    PHYSICAL EXAM:  General:  Elderly. Sitting in chair. No respiratory distress HEENT: normal Neck: supple. JVP 7-8 Carotids 2+ bilaterally; no bruits. No lymphadenopathy or thryomegaly appreciated. Cor: PMI normal. Regular rate & rhythm. Soft SEM RSB.3/6 SEM  LSB. S2 crisp Lungs: clear Abdomen: soft, nontender, nondistended. No hepatosplenomegaly. No bruits or masses. Good bowel sounds. Extremities: no cyanosis, clubbing, rash, tr-1+ edema. diffuse ecchymoses of UEs Neuro: alert & orientedx3, cranial nerves grossly intact. Moves all 4 extremities w/o difficulty. Affect pleasant.   ASSESSMENT & PLAN: 1. Chronic systolic HF - likely related to critical AS. EF 30-35%  --Volume status ok to mildly elevated. NYHA II-III  --Continue losartan and bisoprolol. Increase spiro to 25.  --Suspect EF will recover post-TAVR. Repeat echo in 2-3 months --Check labs 2. Critical AS --S/p TAVR this month with excellent result --Has f/u with [name] 3. Chronic AF --Rate controlled. Continue coumadin 4. Carotid stenosis --s/p R carotid stent. Asx. Continue ASA 8 for at least 1 month. Continue warfarin and statin.  5. CAD --Moderate by cath. No angina. Continue medical therapy  RTC in 1-2 months.   Jisella Ashenfelter,MD 9:43 PM

## 2016-08-10 ENCOUNTER — Ambulatory Visit: Payer: Medicare Other | Admitting: Cardiology

## 2016-08-11 ENCOUNTER — Encounter (HOSPITAL_COMMUNITY)
Admission: RE | Admit: 2016-08-11 | Discharge: 2016-08-11 | Disposition: A | Discharge status: Home / Self Care | Payer: Medicare Other | Source: Skilled Nursing Facility | Attending: *Deleted | Admitting: *Deleted

## 2016-08-11 LAB — PROTIME-INR
INR: 2.29
PROTHROMBIN TIME: 25.6 s — AB (ref 11.4–15.2)

## 2016-08-15 ENCOUNTER — Encounter: Payer: Self-pay | Admitting: Internal Medicine

## 2016-08-15 ENCOUNTER — Non-Acute Institutional Stay (SKILLED_NURSING_FACILITY): Payer: Medicare Other | Admitting: Internal Medicine

## 2016-08-15 DIAGNOSIS — L570 Actinic keratosis: Secondary | ICD-10-CM | POA: Diagnosis not present

## 2016-08-15 DIAGNOSIS — E0842 Diabetes mellitus due to underlying condition with diabetic polyneuropathy: Secondary | ICD-10-CM | POA: Diagnosis not present

## 2016-08-15 DIAGNOSIS — I10 Essential (primary) hypertension: Secondary | ICD-10-CM | POA: Diagnosis not present

## 2016-08-15 DIAGNOSIS — X32XXXD Exposure to sunlight, subsequent encounter: Secondary | ICD-10-CM | POA: Diagnosis not present

## 2016-08-15 DIAGNOSIS — I5022 Chronic systolic (congestive) heart failure: Secondary | ICD-10-CM | POA: Diagnosis not present

## 2016-08-15 DIAGNOSIS — E1140 Type 2 diabetes mellitus with diabetic neuropathy, unspecified: Secondary | ICD-10-CM

## 2016-08-15 DIAGNOSIS — I480 Paroxysmal atrial fibrillation: Secondary | ICD-10-CM

## 2016-08-15 DIAGNOSIS — I6521 Occlusion and stenosis of right carotid artery: Secondary | ICD-10-CM

## 2016-08-15 DIAGNOSIS — Z952 Presence of prosthetic heart valve: Secondary | ICD-10-CM | POA: Diagnosis not present

## 2016-08-15 NOTE — Progress Notes (Signed)
Location:   [facility] Room Number: 160/P Place of Service:  SNF (31) Provider:  Jolayne Haines, MD  Patient Care Team: Kathyrn Drown, MD as PCP - General (Family Medicine) Yehuda Savannah, MD (Cardiology) [facility], MD as Attending Physician Irine Seal, MD as Attending Physician (Urology)  Extended Emergency Contact Information Primary Emergency Contact: [place] Phone: [phone] Relation: Nephew Secondary Emergency Contact: Lois Huxley [place] Phone: [phone] Mobile Phone: [phone] Relation: Niece  Code Status:  Full Code Goals of care: Advanced Directive information Advanced Directives 08/15/2016  Does patient have an advance directive? Yes  Type of Advance Directive [place]  Does patient want to make changes to advanced directive? No - Patient declined  Copy of advanced directive(s) in chart? Yes  Would patient like information on creating an advanced directive? -  Pre-existing out of facility DNR order (yellow form or pink MOST form) -     Chief Complaint  Patient presents with  . Medical Management of Chronic Issues    Routine Visit  . Acute Visit  Medical management of chronic medical conditions including status post T aVR-diabetes 2-CHF-atrial fibrillation-hypertension-neuropathy.  Acute visit secondary to concerns of constipation.    HPI:  Pt is a 82 y.o. male seen today for medical management of chronic diseases.  As noted above. He is here for rehabilitation after undergoing a transcatheter aortic valve replacement for symptomatic aortic stenosis.  He also had a right carotid artery stent for 80% stenosis-  He appears to have done well in this regard he does not complain of increased shortness of breath or chest pain.-Appears to be gaining strength-he was seen by cardiology recently and thought to be doing  well per review of cardiology visit note on 08/09/2016  He does have a history of atrial fibrillation is on Bisoprolol as well as Coumadin for anticoagulation INR has been therapeutic and updated INR is pending for later this week.  Regards to hypertension he is on a beta blocker as well as Cozaar-blood pressures appear somewhatvariable most recent 101/52-150/78-112/72.  He does have a history of combined systolic and diastolic CHF again this appears to be stable he's lost about 10 pounds since his admission here he is on Lasix 20 mg a day as well as Aldactone 25 mg daily. The Aldactone was increased on September 26 when he saw the cardiologist  He also has a history diabetes type 2 is on low-dose Glucophage 250 mg twice a day blood sugars appear to be consistently in the 100.'s  His most acute complaint today is constipation disease not had a bowel movement in the last day or so is not having acue abdominal discomfort but is concerned about this-he is on Colace 100 mg twice a day as well as  Bisacodyl as needed and fleets enema when necessary  He is not complaining of any nausea or vomiting or acute abdominal pain       Past Medical History:  Diagnosis Date  . Aortic stenosis    a. mod-sev by echo 05/2016.  . Asthma   . Cerebrovascular disease 07/2010   TIA; carotid ultrasound in 07/2010-significant bilateral plaque without focal internal carotid artery stenosis; MRI -encephalomalacia left temporal and right temporal lobes; small inferior right cerebellar infarct; small vessel disease  . Chronic atrial fibrillation (HCC)    Paroxysmal; Echocardiogram in 2007-normal EF; mild LVH; left atrial enlargement; mild stenosis and  minimal AI; negative stress nuclear study in 2008  . Chronic systolic CHF (congestive heart failure) ([place])    a. dx 05/2016 - EF 35-40%, diffuse HK, mod-severe AS, mod gradient, severe AVA VTI likely due to decreased cardiac output in setting of systolic dysfsunction and  significant mitral regurgitation, mild MR, mod-severe MR, severe LAE, mild-mod RV dilation, mild RAE, mild-mod TR, mod PASP 4mmHg.  . CKD (chronic kidney disease), stage II   . Degenerative joint disease    feet and legs  . Diabetes mellitus    no insulin; A1c of 6.6 in 2005  . Dizziness    occurs daily,especially in am  . Exertional dyspnea   . Gastroesophageal reflux disease   . Hepatic steatosis   . History of noncompliance with medical treatment   . Hyperlipidemia    adverse reactions to statins and niacin  . Hypertension    Borderline  . Irregular heartbeat   . Mitral regurgitation    a. mod-sev by echo 05/2016  . Peripheral vascular disease ([place])   . Renal insufficiency   . S/P TAVR (transcatheter aortic valve replacement) 07/19/2016   29 mm Edwards Sapien 3 transcatheter heart valve placed via left percutaneous transfemoral approach  . Temporal arteritis ([place])   . Tricuspid regurgitation    a. mild-mod TR by echo 05/2016   Past Surgical History:  Procedure Laterality Date  . COLONOSCOPY  2002  . COLONOSCOPY  01/19/2012   Procedure: COLONOSCOPY;  Surgeon: Rogene Houston, MD;  [facility];  Service: Endoscopy;  Laterality: N/A;  1030  . LIPOMA EXCISION  1980  . ORIF ANKLE FRACTURE  2000   Right  . PERIPHERAL VASCULAR CATHETERIZATION N/A 07/11/2016   Procedure: Carotid PTA/Stent Intervention;  Surgeon: Lorretta Harp, MD;  Location: [facility];  Service: Cardiovascular;  Laterality: N/A;  . PROSTATE SURGERY  12/2011  . ROTATOR CUFF REPAIR     Right  . TEE WITHOUT CARDIOVERSION N/A 06/17/2016   Procedure: TRANSESOPHAGEAL ECHOCARDIOGRAM (TEE);  Surgeon: Jerline Pain, MD;  Location: [facility];  Service: Cardiovascular;  Laterality: N/A;  . TEE WITHOUT CARDIOVERSION N/A 07/19/2016   Procedure: TRANSESOPHAGEAL ECHOCARDIOGRAM (TEE);  Surgeon: Sherren Mocha, MD;  Location: [facility];  Service: Open Heart Surgery;  Laterality: N/A;  . TRANSCATHETER AORTIC VALVE  REPLACEMENT, TRANSFEMORAL N/A 07/19/2016   Procedure: TRANSCATHETER AORTIC VALVE REPLACEMENT, TRANSFEMORAL;  Surgeon: Sherren Mocha, MD;  Location: [facility];  Service: Open Heart Surgery;  Laterality: N/A;  . TRANSURETHRAL RESECTION OF PROSTATE  09/2011  . URETHRAL STRICTURE DILATATION  1980s    Allergies  Allergen Reactions  . Ambien [Zolpidem Tartrate] Other (See Comments)    Sleep walks  . Lipitor [Atorvastatin Calcium] Other (See Comments)    myalgias  . Ranitidine Other (See Comments)    Chest discomfort  . Simvastatin Other (See Comments)    Myalgias  . Xanax Xr [Alprazolam Er] Other (See Comments)    Tightness in chest  . Cholestatin Other (See Comments)    UNSPECIFIED REACTION   . Clopidogrel Bisulfate Rash    No current facility-administered medications on file prior to visit.    Current Outpatient Prescriptions on File Prior to Visit  Medication Sig Dispense Refill  . aspirin 81 MG chewable tablet Chew 1 tablet (81 mg total) by mouth daily.    . bisacodyl (DULCOLAX) 5 MG EC tablet Take 5 mg by mouth daily as needed for moderate constipation.    . bisoprolol (ZEBETA) 5 MG  tablet Take 0.5 tablets (2.5 mg total) by mouth daily.    . furosemide (LASIX) 20 MG tablet Take 1 tablet (20 mg total) by mouth daily. 30 tablet   . HYDROcodone-acetaminophen (NORCO/VICODIN) 5-325 MG tablet Take 1 tablet by mouth every 6 (six) hours as needed for moderate pain. DNE 3gm APAP in 24 hours from all sources 120 tablet 0  . losartan (COZAAR) 25 MG tablet Take 0.5 tablets (12.5 mg total) by mouth daily.    . metFORMIN (GLUCOPHAGE) 500 MG tablet 1/2 tablet twice a day 30 tablet 5  . rosuvastatin (CRESTOR) 5 MG tablet Take 1 tablet (5 mg total) by mouth daily.    [name] spironolactone (ALDACTONE) 25 MG tablet Take 1 tablet (25 mg total) by mouth daily.    . traZODone (DESYREL) 100 MG tablet Take 1 tablet (100 mg total) by mouth at bedtime. 14 tablet 0  . warfarin (COUMADIN) 1 MG tablet Give 1 mg along  with 6 mg to equal 7 mg daily    . warfarin (COUMADIN) 6 MG tablet Give 6 mg along with 1 mg to equal 7 mg by mouth daily      Review of Systems   In general , complain of any fever or chills or increased weakness  Skin does not complain of rashes or itching does complain of some discomfort on her right earlobe he actually is going to the dermatologist today.  Head ears eyes nose mouth and throat does not complain of any visual changes or sore throat.  Respiratory denies shortness of breath or cough.  Cardiac denies chest pain has mild lower extremity edema.  GI is complaining of feeling constipated but does not complaini of acute abdominal discomfort nausea or vomiting  GU does not complain of dysuria.  Musculoskeletal is not complaining of joint pain has some weakness generalized but this appears to be improving with rehabilitation.  Neurologic is not complaining of dizziness headache or syncope-at times does complain of numbness apparently in his toes is not complaining of that today he does have a history of neuropathy  Psych is not complaining of anxiety or depression.  Immunization History  Administered Date(s) Administered  . Influenza Split 08/15/2013  . Influenza,inj,Quad PF,36+ Mos 07/29/2014, 07/28/2015  . Pneumococcal Conjugate-13 08/26/2014  . Pneumococcal-Unspecified 09/13/2002  . Td 05/04/2005  . Zoster 11/12/2008   Pertinent  Health Maintenance Due  Topic Date Due  . OPHTHALMOLOGY EXAM  07/04/1942  . FOOT EXAM  05/31/2015  . URINE MICROALBUMIN  02/16/2016  . INFLUENZA VACCINE  10/14/2016 (Originally 06/14/2016)  . HEMOGLOBIN A1C  11/23/2016  . PNA vac Low Risk Adult  Completed   Fall Risk  01/05/2016  Falls in the past year? No   Functional Status Survey:    Vitals:   08/15/16 1339  BP: 101/62  Pulse: 69  Resp: 18  Temp: 98.1 F (36.7 C)  TempSrc: Oral   Weight is 197.3 Physical Exam  In general this is a pleasant elderly male in no  distress resting comfortably in bed.  His skin is warm and dry-has a slight area of tenderness on his right upper earlobe    Eyes pupils appear reactive light sclera and conjunctiva clear visual acuity appears grossly intact.  Oropharynx is clear mucous membranes moist.  Chest is clear to auscultation there is no labored breathing.  Heart is regular with occasional irregular beat with a 2/6 systolic murmur he does not appear to have significant lower extremity edema I would say  trace pedal pulses are intact.  Abdomen soft nontender mildly distended with positive bowel sounds Rectal exam appear to have some stool appreciated that appear to be somewhat high in the rectal vault.  Musculoskeletal is able to move all extremities 4 I do not note any deformities other than arthritic grip strength appears to be preserved bilaterally.  Neurologic is grossly intact to speech is clear no lateralizing findings.  Psych he is alert and oriented very pleasant and talkative.  Labs. Labs reviewed:  Recent Labs  07/20/16 0355 07/20/16 1841  07/22/16 0500 07/23/16 0411 07/25/16 0655 08/01/16 0550  NA 133*  --   < > 134* 134* 136 136  K 3.9  --   < > 3.9 4.1 3.7 4.0  CL 98*  --   < > 100* 101 103 102  CO2 24  --   < > 26 26 26 27   GLUCOSE 132*  --   < > 148* 140* 127* 131*  BUN 8  --   < > 10 11 19 20   CREATININE 1.06 1.20  < > 1.03 1.03 0.89 1.00  CALCIUM 7.8*  --   < > 8.3* 8.5* 8.4* 8.9  MG 2.1 1.9  --  1.8  --   --   --   < > = values in this interval not displayed.  Recent Labs  05/09/16 1357 06/28/16 1316 08/01/16 0550  AST 15 24 16   ALT 38 27 16*  ALKPHOS 77 72 67  BILITOT 0.9 1.7* 0.3  PROT 6.5 7.1 6.2*  ALBUMIN 3.9 3.9 3.3*    Recent Labs  06/09/16 1654 06/28/16 1316  07/21/16 0440 07/22/16 0500 07/25/16 0655  WBC 7.2 6.8  < > 5.8 9.1 7.0  NEUTROABS 4,896 5.1  --   --   --  4.9  HGB 15.2 15.2  < > 10.1* 10.2* 11.0*  HCT 46.5 44.6  < > 31.1* 30.5*  32.9*  MCV 89.6 89.6  < > 90.7 88.9 89.9  PLT 256 266  < > 123* 153 213  < > = values in this interval not displayed. Lab Results  Component Value Date   TSH 2.730 04/28/2013   Lab Results  Component Value Date   HGBA1C 6.1 05/23/2016   Lab Results  Component Value Date   CHOL 225 (H) 04/13/2016   HDL 34 (L) 04/13/2016   LDLCALC 165 (H) 04/13/2016   TRIG 128 04/13/2016   CHOLHDL 6.6 (H) 04/13/2016    Significant Diagnostic Results in last 30 days:  Dg Chest 2 View  Result Date: 07/23/2016 CLINICAL DATA:  Atelectasis, congestive heart failure. EXAM: CHEST  2 VIEW COMPARISON:  Radiograph of July 20, 2016. FINDINGS: Stable cardiomediastinal silhouette. Right internal jugular catheter has been removed. Right-sided PICC line is unchanged in position. No pneumothorax or pleural effusion is noted. Status post aortic valve repair. Minimal bilateral pleural effusions are noted. Stable mild bibasilar subsegmental atelectasis or edema is noted. IMPRESSION: Stable mild bibasilar subsegmental atelectasis or edema. Minimal bilateral pleural effusions are noted. Electronically Signed   By: Marijo Conception, M.D.   On: 07/23/2016 09:28   Dg Chest Port 1 View  Result Date: 07/20/2016 CLINICAL DATA:  Chest pain and shortness of breath EXAM: PORTABLE CHEST 1 VIEW COMPARISON:  July 19, 2016 FINDINGS: Swan-Ganz catheter tip is in the proximal right intralobar pulmonary artery. Right subclavian catheter tip is at the cavoatrial junction. No pneumothorax. There has been significant partial clearing of interstitial edema. Only a  slight amount of interstitial edema remains. There is no airspace consolidation. Heart is borderline enlarged with mild pulmonary venous hypertension. Prosthetic valve unchanged in position. There is aortic atherosclerosis. IMPRESSION: Significant clearing of interstitial edema with only mild interstitial edema remaining. No new opacity. There remains a degree of pulmonary venous  hypertension. Catheter positions are unchanged without pneumothorax. Electronically Signed   By: Lowella Grip III M.D.   On: 07/20/2016 07:40   Dg Chest Port 1 View  Result Date: 07/19/2016 CLINICAL DATA:  Status post aortic valve replacement.  CHF. EXAM: PORTABLE CHEST 1 VIEW COMPARISON:  Portable chest x-ray of June 30, 2016 FINDINGS: The lungs are mildly hypoinflated. The pulmonary vascularity is engorged and the interstitial markings are increased diffusely. The cardiac silhouette is enlarged. The Swan-Ganz catheter tip projects over proximal main pulmonary artery on the right. The right-sided PICC line tip projects over the distal third of the SVC. There is no definite pleural effusion. The aortic valve cage is visible. IMPRESSION: Congestive heart failure with pulmonary interstitial edema. No alveolar pneumonia. The Swan-Ganz catheter tip may be wedged in a proximal main pulmonary artery branch on the right. Electronically Signed   By: David  Martinique M.D.   On: 07/19/2016 11:27    Assessment/Plan  #1-status post aortic valve replacement T aVR-she appears to be stable in this regard she is followed by cardiology continues on aspirin-also on Coumadin for anticoagulation INR is therapeutic at 2.29 updated INR is pending.  #2 history of atrial fibrillation this appears rate controlled on his beta blocker he is on Coumadin for anticoagulation.  #3 history diabetes type 2 this appears stable on low dose Glucophage CBGs largely in the 100s.  #4 history of acute on chronic systolic  CHF this appears stable he is on Lasix as well as spironolactone-will update a metabolic panel keep an eye on his renal function and electrolytes.  #5 history of neuropathy suspect diabetic related he does continue on Pamelor and this appears to be relatively stabilized. r 6 history coronary artery disease this appears to be stable he is on a statin as well as anticoagulation and a beta blocker.  #7 history of  carotid stenosis status post right carotid artery stent he continues on aspirin I see per cardiology note to continue this for at least one month he is also on Coumadin and again a statin  #8 concerns of constipation-again he is on Colace routinely has fleets enema as well when necessary-would like to obtain an abdominal x-ray to make sure we are not dealing with a bowel obstruction or significant ileus-if x-ray is benign will add a stool agent.  [id number] note greater than 35 minutes spent assessing patient-discussing his concerns at bedside-discussing his status with nursing staff-reviewing his chart-his labs-and coordinating and formulating a plan of care for numerous diagnoses-of note greater than 50% of time spent coordinating plan of care

## 2016-08-16 ENCOUNTER — Encounter (HOSPITAL_COMMUNITY)
Admission: RE | Admit: 2016-08-16 | Discharge: 2016-08-16 | Disposition: A | Discharge status: Home / Self Care | Payer: Medicare Other | Source: Skilled Nursing Facility | Attending: Internal Medicine | Admitting: Internal Medicine

## 2016-08-16 DIAGNOSIS — I482 Chronic atrial fibrillation: Secondary | ICD-10-CM | POA: Insufficient documentation

## 2016-08-16 DIAGNOSIS — D638 Anemia in other chronic diseases classified elsewhere: Secondary | ICD-10-CM | POA: Insufficient documentation

## 2016-08-16 DIAGNOSIS — I2510 Atherosclerotic heart disease of native coronary artery without angina pectoris: Secondary | ICD-10-CM | POA: Insufficient documentation

## 2016-08-16 DIAGNOSIS — Z48812 Encounter for surgical aftercare following surgery on the circulatory system: Secondary | ICD-10-CM | POA: Insufficient documentation

## 2016-08-16 LAB — BASIC METABOLIC PANEL
ANION GAP: 7 (ref 5–15)
BUN: 22 mg/dL — ABNORMAL HIGH (ref 6–20)
CO2: 26 mmol/L (ref 22–32)
Calcium: 8.8 mg/dL — ABNORMAL LOW (ref 8.9–10.3)
Chloride: 101 mmol/L (ref 101–111)
Creatinine, Ser: 1.14 mg/dL (ref 0.61–1.24)
GFR calc Af Amer: >60 mL/min (ref >60)
GFR calc non Af Amer: 58 mL/min — ABNORMAL LOW (ref >60)
GLUCOSE: 147 mg/dL — AB (ref 65–99)
POTASSIUM: 4.2 mmol/L (ref 3.5–5.1)
Sodium: 134 mmol/L — ABNORMAL LOW (ref 135–145)

## 2016-08-18 ENCOUNTER — Other Ambulatory Visit (HOSPITAL_COMMUNITY)
Admission: AD | Admit: 2016-08-18 | Discharge: 2016-08-18 | Disposition: A | Discharge status: Home / Self Care | Payer: Medicare Other | Source: Skilled Nursing Facility | Attending: Internal Medicine | Admitting: Internal Medicine

## 2016-08-18 ENCOUNTER — Encounter: Payer: Self-pay | Admitting: Internal Medicine

## 2016-08-18 ENCOUNTER — Non-Acute Institutional Stay (SKILLED_NURSING_FACILITY): Payer: Medicare Other | Admitting: Internal Medicine

## 2016-08-18 DIAGNOSIS — Z952 Presence of prosthetic heart valve: Secondary | ICD-10-CM

## 2016-08-18 DIAGNOSIS — I480 Paroxysmal atrial fibrillation: Secondary | ICD-10-CM | POA: Diagnosis not present

## 2016-08-18 DIAGNOSIS — E1142 Type 2 diabetes mellitus with diabetic polyneuropathy: Secondary | ICD-10-CM

## 2016-08-18 DIAGNOSIS — I2510 Atherosclerotic heart disease of native coronary artery without angina pectoris: Secondary | ICD-10-CM | POA: Insufficient documentation

## 2016-08-18 DIAGNOSIS — D638 Anemia in other chronic diseases classified elsewhere: Secondary | ICD-10-CM | POA: Insufficient documentation

## 2016-08-18 DIAGNOSIS — I10 Essential (primary) hypertension: Secondary | ICD-10-CM | POA: Diagnosis not present

## 2016-08-18 DIAGNOSIS — Z48812 Encounter for surgical aftercare following surgery on the circulatory system: Secondary | ICD-10-CM | POA: Insufficient documentation

## 2016-08-18 LAB — BASIC METABOLIC PANEL
ANION GAP: 9 (ref 5–15)
BUN: 24 mg/dL — AB (ref 6–20)
CO2: 27 mmol/L (ref 22–32)
Calcium: 9.3 mg/dL (ref 8.9–10.3)
Chloride: 100 mmol/L — ABNORMAL LOW (ref 101–111)
Creatinine, Ser: 1.43 mg/dL — ABNORMAL HIGH (ref 0.61–1.24)
GFR, EST AFRICAN AMERICAN: 51 mL/min — AB (ref >60)
GFR, EST NON AFRICAN AMERICAN: 44 mL/min — AB (ref >60)
Glucose, Bld: 163 mg/dL — ABNORMAL HIGH (ref 65–99)
POTASSIUM: 4.9 mmol/L (ref 3.5–5.1)
SODIUM: 136 mmol/L (ref 135–145)

## 2016-08-18 LAB — PROTIME-INR
INR: 3.25
PROTHROMBIN TIME: 33.8 s — AB (ref 11.4–15.2)

## 2016-08-18 NOTE — Progress Notes (Signed)
Location:   [facility] Room Number: 160/P Place of Service:  SNF (31)  Provider: Mechele Kittleson,Lasen  PCP: Sallee Lange, MD Patient Care Team: Kathyrn Drown, MD as PCP - General (Family Medicine) Yehuda Savannah, MD (Cardiology) [facility], MD as Attending Physician Irine Seal, MD as Attending Physician (Urology)  Extended Emergency Contact Information Primary Emergency Contact: [place] of [place] Phone: [phone] Relation: Nephew Secondary Emergency Contact: Thayne of [place] Phone: [phone] Mobile Phone: [phone] Relation: Niece  Code Status: Full Code Goals of care:  Advanced Directive information Advanced Directives 08/18/2016  Does patient have an advance directive? Yes  Type of Advance Directive Healthcare Power of Attorney  Does patient want to make changes to advanced directive? -  Copy of advanced directive(s) in chart? -  Would patient like information on creating an advanced directive? -  Pre-existing out of facility DNR order (yellow form or pink MOST form) -     Allergies  Allergen Reactions  . Ambien [Zolpidem Tartrate] Other (See Comments)    Sleep walks  . Lipitor [Atorvastatin Calcium] Other (See Comments)    myalgias  . Ranitidine Other (See Comments)    Chest discomfort  . Simvastatin Other (See Comments)    Myalgias  . Xanax Xr [Alprazolam Er] Other (See Comments)    Tightness in chest  . Cholestatin Other (See Comments)    UNSPECIFIED REACTION   . Clopidogrel Bisulfate Rash    Chief Complaint  Patient presents with  . Discharge Note    HPI:  82 y.o. male  seen today for discharge from facility.  Patient was here for rehabilitation after undergoing a transcatheter aortic valve replacement for symptomatic aortic stenosis.  He also had a right coronary artery stent for 80% stenosis.  He has done well with his rehabilitation appears to have  gained strength does not complain of chest pain or increased shortness of breath.  He was seen by cardiology on September 26 and thought to be doing well he did increase his Aldactone up to 25 mg a day he is also on Lasix 20 mg a day with a history of CHF.  He does have a history of atrial fibrillation is on bisoprolol and Coumadin for anticoagulation INR slightly supratherapeutic today at 3.25 he is on 7 mg of Coumadin will hold it tonight and recheck that tomorrow and likely restart him 6 mg a day with follow-up next week but home health and primary care provider notified of results.  He does have a history of hypertension is on the beta blocker as well as Cozaar blood pressures appear fairly stable most recently 108/68-101/52-I do see 152/78 but this does not appear to be consistent.  Regards to diabetes type 2 he is on low-dose Glucophage 2050 mg twice a day and blood sugars continue to be consistently in the 100s.  He is complaining somewhat today of mild discomfort this appears to be somewhat chronic otherwise he has no complaints.  He will be going home he does live with this while C will need continued PT and OT for strengthening as well as nursing support for his multiple medical issues        Past Medical History:  Diagnosis Date  . Aortic stenosis    a. mod-sev by echo 05/2016.  . Asthma   . Cerebrovascular disease 07/2010   TIA; carotid ultrasound in 07/2010-significant bilateral plaque without focal internal carotid artery stenosis; MRI -encephalomalacia  left temporal and right temporal lobes; small inferior right cerebellar infarct; small vessel disease  . Chronic atrial fibrillation (HCC)    Paroxysmal; Echocardiogram in 2007-normal EF; mild LVH; left atrial enlargement; mild stenosis and minimal AI; negative stress nuclear study in 2008  . Chronic systolic CHF (congestive heart failure) ([place])    a. dx 05/2016 - EF 35-40%, diffuse HK, mod-severe AS, mod gradient, severe AVA  VTI likely due to decreased cardiac output in setting of systolic dysfsunction and significant mitral regurgitation, mild MR, mod-severe MR, severe LAE, mild-mod RV dilation, mild RAE, mild-mod TR, mod PASP 60mmHg.  . CKD (chronic kidney disease), stage II   . Degenerative joint disease    feet and legs  . Diabetes mellitus    no insulin; A1c of 6.6 in 2005  . Dizziness    occurs daily,especially in am  . Exertional dyspnea   . Gastroesophageal reflux disease   . Hepatic steatosis   . History of noncompliance with medical treatment   . Hyperlipidemia    adverse reactions to statins and niacin  . Hypertension    Borderline  . Irregular heartbeat   . Mitral regurgitation    a. mod-sev by echo 05/2016  . Peripheral vascular disease ([place])   . Renal insufficiency   . S/P TAVR (transcatheter aortic valve replacement) 07/19/2016   29 mm Edwards Sapien 3 transcatheter heart valve placed via left percutaneous transfemoral approach  . Temporal arteritis ([place])   . Tricuspid regurgitation    a. mild-mod TR by echo 05/2016    Past Surgical History:  Procedure Laterality Date  . COLONOSCOPY  2002  . COLONOSCOPY  01/19/2012   Procedure: COLONOSCOPY;  Surgeon: Rogene Houston, MD;  [facility];  Service: Endoscopy;  Laterality: N/A;  1030  . LIPOMA EXCISION  1980  . ORIF ANKLE FRACTURE  2000   Right  . PERIPHERAL VASCULAR CATHETERIZATION N/A 07/11/2016   Procedure: Carotid PTA/Stent Intervention;  Surgeon: Lorretta Harp, MD;  Location: [facility];  Service: Cardiovascular;  Laterality: N/A;  . PROSTATE SURGERY  12/2011  . ROTATOR CUFF REPAIR     Right  . TEE WITHOUT CARDIOVERSION N/A 06/17/2016   Procedure: TRANSESOPHAGEAL ECHOCARDIOGRAM (TEE);  Surgeon: Jerline Pain, MD;  Location: [facility];  Service: Cardiovascular;  Laterality: N/A;  . TEE WITHOUT CARDIOVERSION N/A 07/19/2016   Procedure: TRANSESOPHAGEAL ECHOCARDIOGRAM (TEE);  Surgeon: Sherren Mocha, MD;  Location: [facility];  Service: Open Heart Surgery;  Laterality: N/A;  . TRANSCATHETER AORTIC VALVE REPLACEMENT, TRANSFEMORAL N/A 07/19/2016   Procedure: TRANSCATHETER AORTIC VALVE REPLACEMENT, TRANSFEMORAL;  Surgeon: Sherren Mocha, MD;  Location: [facility];  Service: Open Heart Surgery;  Laterality: N/A;  . TRANSURETHRAL RESECTION OF PROSTATE  09/2011  . URETHRAL STRICTURE DILATATION  1980s      reports that he quit smoking about 24 years ago. His smoking use included Cigarettes. He started smoking about 66 years ago. He has a 20.00 pack-year smoking history. His smokeless tobacco use includes Chew. He reports that he does not drink alcohol or use drugs. Social History   Social History  . Marital status: Married    Spouse name: N/A  . Number of children: 1  . Years of education: N/A   Occupational History  . Retired    Social History Main Topics  . Smoking status: Former Smoker    Packs/day: 1.00    Years: 20.00    Types: Cigarettes    Start date: 07/04/1950  Quit date: 04/25/1992  . Smokeless tobacco: Current User    Types: Chew  . Alcohol use No  . Drug use: No  . Sexual activity: Not Currently   Other Topics Concern  . Not on file   Social History Narrative  . No narrative on file   Functional Status Survey:    Allergies  Allergen Reactions  . Ambien [Zolpidem Tartrate] Other (See Comments)    Sleep walks  . Lipitor [Atorvastatin Calcium] Other (See Comments)    myalgias  . Ranitidine Other (See Comments)    Chest discomfort  . Simvastatin Other (See Comments)    Myalgias  . Xanax Xr [Alprazolam Er] Other (See Comments)    Tightness in chest  . Cholestatin Other (See Comments)    UNSPECIFIED REACTION   . Clopidogrel Bisulfate Rash    Pertinent  Health Maintenance Due  Topic Date Due  . OPHTHALMOLOGY EXAM  07/04/1942  . FOOT EXAM  05/31/2015  . URINE MICROALBUMIN  02/16/2016  . INFLUENZA VACCINE  10/14/2016 (Originally 06/14/2016)  . HEMOGLOBIN A1C  11/23/2016  . PNA vac  Low Risk Adult  Completed    Medications: Current Outpatient Prescriptions on File Prior to Visit  Medication Sig Dispense Refill  . aspirin 81 MG chewable tablet Chew 1 tablet (81 mg total) by mouth daily.    . bisacodyl (DULCOLAX) 5 MG EC tablet Take 5 mg by mouth daily as needed for moderate constipation.    . bisoprolol (ZEBETA) 5 MG tablet Take 0.5 tablets (2.5 mg total) by mouth daily.    [name] docusate sodium (COLACE) 100 MG capsule Take 100 mg by mouth 2 (two) times daily.    . furosemide (LASIX) 20 MG tablet Take 1 tablet (20 mg total) by mouth daily. 30 tablet   . HYDROcodone-acetaminophen (NORCO/VICODIN) 5-325 MG tablet Take 1 tablet by mouth every 6 (six) hours as needed for moderate pain. DNE 3gm APAP in 24 hours from all sources 120 tablet 0  . losartan (COZAAR) 25 MG tablet Take 0.5 tablets (12.5 mg total) by mouth daily.    . metFORMIN (GLUCOPHAGE) 500 MG tablet 1/2 tablet twice a day 30 tablet 5  . nortriptyline (PAMELOR) 10 MG capsule Take 10 mg by mouth at bedtime.    . rosuvastatin (CRESTOR) 5 MG tablet Take 1 tablet (5 mg total) by mouth daily.    [name] spironolactone (ALDACTONE) 25 MG tablet Take 1 tablet (25 mg total) by mouth daily.    . traZODone (DESYREL) 100 MG tablet Take 1 tablet (100 mg total) by mouth at bedtime. 14 tablet 0  . warfarin (COUMADIN) 1 MG tablet Give 1 mg along with 6 mg to equal 7 mg daily    . warfarin (COUMADIN) 6 MG tablet Give 6 mg along with 1 mg to equal 7 mg by mouth daily     No current facility-administered medications on file prior to visit.      Review of Systems  In general , complain of any fever or chills or increased weakness  Skin does not complain of rashes or itching  has seen dermatology for right ear lesion that was treated with liquid nitrogen apparently.  Head ears eyes nose mouth and throat does not complain of any visual changes or sore throat. Says his tongue feels a little sore  Respiratory denies shortness of breath  or cough.  Cardiac denies chest pain has mild lower extremity edema.  GI does not complain of abdominal pain had complain of  constipation previously but is not complaining of that today  GU does not complain of dysuria.  Musculoskeletal is not complaining of joint pain has some weakness generalized but this appears to be improving with rehabilitation.  Neurologic is not complaining of dizziness headache or syncope-at times does complain of numbness apparently in his toes is not complaining of that today he does have a history of neuropathy  Psych is not complaining of anxiety or depression.   Vitals:   08/18/16 1131  BP: 126/61  Pulse: 79  Resp: 20  Temp: 98 F (36.7 C)  TempSrc: Oral   Weight is 198.6 this appears stable over the past week down about 10 pounds since his admission Physical Exam In general this is a pleasant elderly male in no distress resting comfortably in bed.  His skin is warm and dry-has a slight area of tenderness on his right upper earlobe I do not see signs of infection or increased erythema however    Eyes pupils appear reactive light sclera and conjunctiva clear visual acuity appears grossly intact.  Oropharynx is clear mucous membranes moist.-Tongue is midline with full range of motion I do not really see any lesions  Or  sores on the tongue  Chest is clear to auscultation there is no labored breathing.  Heart is regular with occasional irregular beat with a 2/6 systolic murmur he does not appear to have significant lower extremity edema I would say trace pedal pulses are intact.  Abdomen soft nontender mildly distended with positive bowel sounds   Musculoskeletal is able to move all extremities 4 I do not note any deformities other than arthritic grip strength appears to be preserved bilaterally. He is ambulatory with a walker  Neurologic is grossly intact to speech is clear no lateralizing findings.  Psych he is alert and  oriented pleasant and talkative.  Labs reviewed: Basic Metabolic Panel:  Recent Labs  07/20/16 0355 07/20/16 1841  07/22/16 0500  08/01/16 0550 08/16/16 0730 08/18/16 0700  NA 133*  --   < > 134*  < > 136 134* 136  K 3.9  --   < > 3.9  < > 4.0 4.2 4.9  CL 98*  --   < > 100*  < > 102 101 100*  CO2 24  --   < > 26  < > 27 26 27   GLUCOSE 132*  --   < > 148*  < > 131* 147* 163*  BUN 8  --   < > 10  < > 20 22* 24*  CREATININE 1.06 1.20  < > 1.03  < > 1.00 1.14 1.43*  CALCIUM 7.8*  --   < > 8.3*  < > 8.9 8.8* 9.3  MG 2.1 1.9  --  1.8  --   --   --   --   < > = values in this interval not displayed. Liver Function Tests:  Recent Labs  05/09/16 1357 06/28/16 1316 08/01/16 0550  AST 15 24 16   ALT 38 27 16*  ALKPHOS 77 72 67  BILITOT 0.9 1.7* 0.3  PROT 6.5 7.1 6.2*  ALBUMIN 3.9 3.9 3.3*   No results for input(s): LIPASE, AMYLASE in the last 8760 hours. No results for input(s): AMMONIA in the last 8760 hours. CBC:  Recent Labs  06/09/16 1654 06/28/16 1316  07/21/16 0440 07/22/16 0500 07/25/16 0655  WBC 7.2 6.8  < > 5.8 9.1 7.0  NEUTROABS 4,896 5.1  --   --   --  4.9  HGB 15.2 15.2  < > 10.1* 10.2* 11.0*  HCT 46.5 44.6  < > 31.1* 30.5* 32.9*  MCV 89.6 89.6  < > 90.7 88.9 89.9  PLT 256 266  < > 123* 153 213  < > = values in this interval not displayed. Cardiac Enzymes:  Recent Labs  08/20/15 1745 05/03/16 1305 05/28/16 1617  TROPONINI <0.03 0.03 <0.03   BNP: Invalid input(s): POCBNP CBG:  Recent Labs  07/22/16 2050 07/23/16 0629 07/23/16 1134  GLUCAP 130* 130* 163*    Procedures and Imaging Studies During Stay: Dg Chest 2 View  Result Date: 07/23/2016 CLINICAL DATA:  Atelectasis, congestive heart failure. EXAM: CHEST  2 VIEW COMPARISON:  Radiograph of July 20, 2016. FINDINGS: Stable cardiomediastinal silhouette. Right internal jugular catheter has been removed. Right-sided PICC line is unchanged in position. No pneumothorax or pleural effusion is  noted. Status post aortic valve repair. Minimal bilateral pleural effusions are noted. Stable mild bibasilar subsegmental atelectasis or edema is noted. IMPRESSION: Stable mild bibasilar subsegmental atelectasis or edema. Minimal bilateral pleural effusions are noted. Electronically Signed   By: Marijo Conception, M.D.   On: 07/23/2016 09:28   Dg Chest Port 1 View  Result Date: 07/20/2016 CLINICAL DATA:  Chest pain and shortness of breath EXAM: PORTABLE CHEST 1 VIEW COMPARISON:  July 19, 2016 FINDINGS: Swan-Ganz catheter tip is in the proximal right intralobar pulmonary artery. Right subclavian catheter tip is at the cavoatrial junction. No pneumothorax. There has been significant partial clearing of interstitial edema. Only a slight amount of interstitial edema remains. There is no airspace consolidation. Heart is borderline enlarged with mild pulmonary venous hypertension. Prosthetic valve unchanged in position. There is aortic atherosclerosis. IMPRESSION: Significant clearing of interstitial edema with only mild interstitial edema remaining. No new opacity. There remains a degree of pulmonary venous hypertension. Catheter positions are unchanged without pneumothorax. Electronically Signed   By: Lowella Grip III M.D.   On: 07/20/2016 07:40    Assessment/Plan:   .  #1-status post aortic valve replacement T aVR- appears to be stable in this regard he is followed by cardiology continues on aspirin-also on Coumadin for anticoagulation INR is slightly supratherapeutic at 3.25 will hold Coumadin tonight and recheck tomorrow and likely restart him on 6 mg a day with update INR early next week and primary care provider follow-up.    #2 history of atrial fibrillation this appears rate controlled on his beta blocker he is on Coumadin for anticoagulation. As noted above  #3 history diabetes type 2 this appears stable on low dose Glucophage CBGs largely in the 100s.  #4 history of acute on chronic  systolic  CHF this appears stable he is on Lasix as well as spironolactone-I do note his creatinine is up somewhat from his baseline at 1.43-this will warrant  follow-up by primary care provider  #5 history of neuropathy suspect diabetic related he does continue on Pamelor and this appears to be relatively stabilized. r 6 history coronary artery disease this appears to be stable he is on a statin as well as anticoagulation and a beta blocker.  #7 history of carotid stenosis status post right carotid artery stent he continues on aspirin I see per cardiology note to continue this for at least one month he is also on Coumadin and again a statin  #8  Constipation-he is not complaining of that today he continues on Colace as well as fleets enema when necessary.  #9 mouth discomfort-will order Magic mouthwash  when necessary  Patient is being discharged with the following home health services:  PT and OT for strengthening as well as nursing support for his multiple medical issues   :    Patient has been advised to f/u with their PCP in 1-2 weeks to bring them up to date on their rehab stay.  Social services at facility was responsible for arranging this appointment.  Pt was provided with a 30 day supply of prescriptions for medications and refills must be obtained from their PCP.  For controlled substances, a more limited supply may be provided adequate until PCP appointment only.   [id number] note greater than 30 minutes spent on this discharge summary-greater than 50% of time spent coordinating plan of care for numerous diagnoses

## 2016-08-19 ENCOUNTER — Encounter (HOSPITAL_COMMUNITY)
Admission: RE | Admit: 2016-08-19 | Discharge: 2016-08-19 | Disposition: A | Discharge status: Home / Self Care | Payer: Medicare Other | Source: Skilled Nursing Facility | Attending: *Deleted | Admitting: *Deleted

## 2016-08-19 LAB — PROTIME-INR
INR: 2.85
Prothrombin Time: 30.5 seconds — ABNORMAL HIGH (ref 11.4–15.2)

## 2016-08-22 ENCOUNTER — Encounter: Payer: Self-pay | Admitting: Internal Medicine

## 2016-08-22 ENCOUNTER — Ambulatory Visit (INDEPENDENT_AMBULATORY_CARE_PROVIDER_SITE_OTHER): Payer: Medicare Other | Admitting: *Deleted

## 2016-08-22 DIAGNOSIS — K219 Gastro-esophageal reflux disease without esophagitis: Secondary | ICD-10-CM | POA: Diagnosis not present

## 2016-08-22 DIAGNOSIS — I482 Chronic atrial fibrillation: Secondary | ICD-10-CM | POA: Diagnosis not present

## 2016-08-22 DIAGNOSIS — I340 Nonrheumatic mitral (valve) insufficiency: Secondary | ICD-10-CM | POA: Diagnosis not present

## 2016-08-22 DIAGNOSIS — Z952 Presence of prosthetic heart valve: Secondary | ICD-10-CM | POA: Diagnosis not present

## 2016-08-22 DIAGNOSIS — I5042 Chronic combined systolic (congestive) and diastolic (congestive) heart failure: Secondary | ICD-10-CM | POA: Diagnosis not present

## 2016-08-22 DIAGNOSIS — I2510 Atherosclerotic heart disease of native coronary artery without angina pectoris: Secondary | ICD-10-CM | POA: Diagnosis not present

## 2016-08-22 DIAGNOSIS — I739 Peripheral vascular disease, unspecified: Secondary | ICD-10-CM | POA: Diagnosis not present

## 2016-08-22 DIAGNOSIS — I4891 Unspecified atrial fibrillation: Secondary | ICD-10-CM

## 2016-08-22 DIAGNOSIS — N182 Chronic kidney disease, stage 2 (mild): Secondary | ICD-10-CM | POA: Diagnosis not present

## 2016-08-22 DIAGNOSIS — Z48812 Encounter for surgical aftercare following surgery on the circulatory system: Secondary | ICD-10-CM | POA: Diagnosis not present

## 2016-08-22 DIAGNOSIS — I350 Nonrheumatic aortic (valve) stenosis: Secondary | ICD-10-CM | POA: Diagnosis not present

## 2016-08-22 DIAGNOSIS — E1122 Type 2 diabetes mellitus with diabetic chronic kidney disease: Secondary | ICD-10-CM | POA: Diagnosis not present

## 2016-08-22 DIAGNOSIS — G8929 Other chronic pain: Secondary | ICD-10-CM | POA: Diagnosis not present

## 2016-08-22 DIAGNOSIS — Z5181 Encounter for therapeutic drug level monitoring: Secondary | ICD-10-CM | POA: Diagnosis not present

## 2016-08-22 DIAGNOSIS — Z7984 Long term (current) use of oral hypoglycemic drugs: Secondary | ICD-10-CM | POA: Diagnosis not present

## 2016-08-22 DIAGNOSIS — I110 Hypertensive heart disease with heart failure: Secondary | ICD-10-CM | POA: Diagnosis not present

## 2016-08-22 DIAGNOSIS — D638 Anemia in other chronic diseases classified elsewhere: Secondary | ICD-10-CM | POA: Diagnosis not present

## 2016-08-22 LAB — POCT INR: INR: 2.5

## 2016-08-24 ENCOUNTER — Encounter: Payer: Self-pay | Admitting: Family Medicine

## 2016-08-24 ENCOUNTER — Ambulatory Visit (INDEPENDENT_AMBULATORY_CARE_PROVIDER_SITE_OTHER): Payer: Medicare Other | Admitting: Family Medicine

## 2016-08-24 DIAGNOSIS — E1122 Type 2 diabetes mellitus with diabetic chronic kidney disease: Secondary | ICD-10-CM | POA: Diagnosis not present

## 2016-08-24 DIAGNOSIS — I6529 Occlusion and stenosis of unspecified carotid artery: Secondary | ICD-10-CM | POA: Diagnosis not present

## 2016-08-24 DIAGNOSIS — I5043 Acute on chronic combined systolic (congestive) and diastolic (congestive) heart failure: Secondary | ICD-10-CM

## 2016-08-24 DIAGNOSIS — N289 Disorder of kidney and ureter, unspecified: Secondary | ICD-10-CM

## 2016-08-24 DIAGNOSIS — E119 Type 2 diabetes mellitus without complications: Secondary | ICD-10-CM

## 2016-08-24 DIAGNOSIS — Z48812 Encounter for surgical aftercare following surgery on the circulatory system: Secondary | ICD-10-CM | POA: Diagnosis not present

## 2016-08-24 DIAGNOSIS — I481 Persistent atrial fibrillation: Secondary | ICD-10-CM | POA: Diagnosis not present

## 2016-08-24 DIAGNOSIS — I482 Chronic atrial fibrillation: Secondary | ICD-10-CM | POA: Diagnosis not present

## 2016-08-24 DIAGNOSIS — I340 Nonrheumatic mitral (valve) insufficiency: Secondary | ICD-10-CM | POA: Diagnosis not present

## 2016-08-24 DIAGNOSIS — I2510 Atherosclerotic heart disease of native coronary artery without angina pectoris: Secondary | ICD-10-CM | POA: Diagnosis not present

## 2016-08-24 DIAGNOSIS — I350 Nonrheumatic aortic (valve) stenosis: Secondary | ICD-10-CM | POA: Diagnosis not present

## 2016-08-24 DIAGNOSIS — I4819 Other persistent atrial fibrillation: Secondary | ICD-10-CM

## 2016-08-24 LAB — POCT GLYCOSYLATED HEMOGLOBIN (HGB A1C): HEMOGLOBIN A1C: 6.5

## 2016-08-24 NOTE — Progress Notes (Signed)
   Subjective:    Patient ID: [name] [name], male    DOB: Oct 05, 1932, 82 y.o.   MRN: [id number]  HPIRehab follow up.  Pt had INR done on 08/22/16. Result 2.5. Done at [facility]. Pt takes 6mg  tablet one a day. Since being in the hospital they have changed his Coumadin. He is now using 6 mg tablets where he was using 5 mg tablet Patient relates compliance with medicines but he does not necessarily know the medicines by name nor does he have his medicines with him today Patient states he is trying to watch his diet to some degree and denies any high sugars denies any low sugars Patient was in the hospital for extended care valve replacement and in a local rest home nursing home to gather his energy and strength patient getting physical therapy at home currently.  A1C done today. INR today 2.5.    Review of Systems He denies any chest tightness pressure pain shortness of breath he does relate weakness states his energy is improving no fever chills no vomiting or diarrhea    Objective:   Physical Exam Lungs clear no crackles heart irregular but rate is controlled extremities trace edema in the ankles abdomen soft       Assessment & Plan:  Transitional care from nursing home to here. Patient undergoing significant issues  Valvular disease seems to be doing well  Renal insufficiency repeat metabolic 7 proper nutrition and hydration discuss  Anticoagulation discussed including risk continue 6 mg Coumadin 1 daily recheck INR again in 1 week's time then we'll check less often if stable  Face-to-face evaluation done patient does benefit from home health as well as physical therapy for deconditioning try to strengthen her  Recheck patient in approximately 3-4 weeks, patient was encouraged bring medications with him on the next visit so we can go over all of these

## 2016-08-25 DIAGNOSIS — E1122 Type 2 diabetes mellitus with diabetic chronic kidney disease: Secondary | ICD-10-CM | POA: Diagnosis not present

## 2016-08-25 DIAGNOSIS — I340 Nonrheumatic mitral (valve) insufficiency: Secondary | ICD-10-CM | POA: Diagnosis not present

## 2016-08-25 DIAGNOSIS — I2510 Atherosclerotic heart disease of native coronary artery without angina pectoris: Secondary | ICD-10-CM | POA: Diagnosis not present

## 2016-08-25 DIAGNOSIS — Z48812 Encounter for surgical aftercare following surgery on the circulatory system: Secondary | ICD-10-CM | POA: Diagnosis not present

## 2016-08-25 DIAGNOSIS — I350 Nonrheumatic aortic (valve) stenosis: Secondary | ICD-10-CM | POA: Diagnosis not present

## 2016-08-25 DIAGNOSIS — I482 Chronic atrial fibrillation: Secondary | ICD-10-CM | POA: Diagnosis not present

## 2016-08-25 LAB — BASIC METABOLIC PANEL
BUN/Creatinine Ratio: 17 (ref 10–24)
BUN: 20 mg/dL (ref 8–27)
CALCIUM: 9.6 mg/dL (ref 8.6–10.2)
CO2: 26 mmol/L (ref 18–29)
CREATININE: 1.19 mg/dL (ref 0.76–1.27)
Chloride: 99 mmol/L (ref 96–106)
GFR calc Af Amer: 65 mL/min/{1.73_m2} (ref >59)
GFR, EST NON AFRICAN AMERICAN: 57 mL/min/{1.73_m2} — AB (ref >59)
Glucose: 142 mg/dL — ABNORMAL HIGH (ref 65–99)
Potassium: 4.8 mmol/L (ref 3.5–5.2)
SODIUM: 140 mmol/L (ref 134–144)

## 2016-08-26 DIAGNOSIS — E1122 Type 2 diabetes mellitus with diabetic chronic kidney disease: Secondary | ICD-10-CM | POA: Diagnosis not present

## 2016-08-26 DIAGNOSIS — I2510 Atherosclerotic heart disease of native coronary artery without angina pectoris: Secondary | ICD-10-CM | POA: Diagnosis not present

## 2016-08-26 DIAGNOSIS — I350 Nonrheumatic aortic (valve) stenosis: Secondary | ICD-10-CM | POA: Diagnosis not present

## 2016-08-26 DIAGNOSIS — I340 Nonrheumatic mitral (valve) insufficiency: Secondary | ICD-10-CM | POA: Diagnosis not present

## 2016-08-26 DIAGNOSIS — I482 Chronic atrial fibrillation: Secondary | ICD-10-CM | POA: Diagnosis not present

## 2016-08-26 DIAGNOSIS — Z48812 Encounter for surgical aftercare following surgery on the circulatory system: Secondary | ICD-10-CM | POA: Diagnosis not present

## 2016-08-29 DIAGNOSIS — Z48812 Encounter for surgical aftercare following surgery on the circulatory system: Secondary | ICD-10-CM | POA: Diagnosis not present

## 2016-08-29 DIAGNOSIS — I340 Nonrheumatic mitral (valve) insufficiency: Secondary | ICD-10-CM | POA: Diagnosis not present

## 2016-08-29 DIAGNOSIS — I350 Nonrheumatic aortic (valve) stenosis: Secondary | ICD-10-CM | POA: Diagnosis not present

## 2016-08-29 DIAGNOSIS — E1122 Type 2 diabetes mellitus with diabetic chronic kidney disease: Secondary | ICD-10-CM | POA: Diagnosis not present

## 2016-08-29 DIAGNOSIS — I482 Chronic atrial fibrillation: Secondary | ICD-10-CM | POA: Diagnosis not present

## 2016-08-29 DIAGNOSIS — I2510 Atherosclerotic heart disease of native coronary artery without angina pectoris: Secondary | ICD-10-CM | POA: Diagnosis not present

## 2016-08-30 DIAGNOSIS — I340 Nonrheumatic mitral (valve) insufficiency: Secondary | ICD-10-CM | POA: Diagnosis not present

## 2016-08-30 DIAGNOSIS — I2510 Atherosclerotic heart disease of native coronary artery without angina pectoris: Secondary | ICD-10-CM | POA: Diagnosis not present

## 2016-08-30 DIAGNOSIS — E1122 Type 2 diabetes mellitus with diabetic chronic kidney disease: Secondary | ICD-10-CM | POA: Diagnosis not present

## 2016-08-30 DIAGNOSIS — I350 Nonrheumatic aortic (valve) stenosis: Secondary | ICD-10-CM | POA: Diagnosis not present

## 2016-08-30 DIAGNOSIS — Z48812 Encounter for surgical aftercare following surgery on the circulatory system: Secondary | ICD-10-CM | POA: Diagnosis not present

## 2016-08-30 DIAGNOSIS — I482 Chronic atrial fibrillation: Secondary | ICD-10-CM | POA: Diagnosis not present

## 2016-09-01 ENCOUNTER — Ambulatory Visit (INDEPENDENT_AMBULATORY_CARE_PROVIDER_SITE_OTHER): Payer: Medicare Other

## 2016-09-01 DIAGNOSIS — I350 Nonrheumatic aortic (valve) stenosis: Secondary | ICD-10-CM | POA: Diagnosis not present

## 2016-09-01 DIAGNOSIS — Z7901 Long term (current) use of anticoagulants: Secondary | ICD-10-CM | POA: Diagnosis not present

## 2016-09-01 DIAGNOSIS — Z48812 Encounter for surgical aftercare following surgery on the circulatory system: Secondary | ICD-10-CM | POA: Diagnosis not present

## 2016-09-01 DIAGNOSIS — I2510 Atherosclerotic heart disease of native coronary artery without angina pectoris: Secondary | ICD-10-CM | POA: Diagnosis not present

## 2016-09-01 DIAGNOSIS — I482 Chronic atrial fibrillation: Secondary | ICD-10-CM | POA: Diagnosis not present

## 2016-09-01 DIAGNOSIS — I340 Nonrheumatic mitral (valve) insufficiency: Secondary | ICD-10-CM | POA: Diagnosis not present

## 2016-09-01 DIAGNOSIS — E1122 Type 2 diabetes mellitus with diabetic chronic kidney disease: Secondary | ICD-10-CM | POA: Diagnosis not present

## 2016-09-01 LAB — POCT INR: INR: 2.4

## 2016-09-01 NOTE — Patient Instructions (Signed)
Take 1 tablet by mouth daily (6 MG coumadin)

## 2016-09-02 DIAGNOSIS — I482 Chronic atrial fibrillation: Secondary | ICD-10-CM | POA: Diagnosis not present

## 2016-09-02 DIAGNOSIS — Z48812 Encounter for surgical aftercare following surgery on the circulatory system: Secondary | ICD-10-CM | POA: Diagnosis not present

## 2016-09-02 DIAGNOSIS — I350 Nonrheumatic aortic (valve) stenosis: Secondary | ICD-10-CM | POA: Diagnosis not present

## 2016-09-02 DIAGNOSIS — I2510 Atherosclerotic heart disease of native coronary artery without angina pectoris: Secondary | ICD-10-CM | POA: Diagnosis not present

## 2016-09-02 DIAGNOSIS — E1122 Type 2 diabetes mellitus with diabetic chronic kidney disease: Secondary | ICD-10-CM | POA: Diagnosis not present

## 2016-09-02 DIAGNOSIS — I340 Nonrheumatic mitral (valve) insufficiency: Secondary | ICD-10-CM | POA: Diagnosis not present

## 2016-09-05 ENCOUNTER — Encounter: Payer: Self-pay | Admitting: Cardiovascular Disease

## 2016-09-05 ENCOUNTER — Other Ambulatory Visit: Payer: Self-pay

## 2016-09-05 ENCOUNTER — Ambulatory Visit (INDEPENDENT_AMBULATORY_CARE_PROVIDER_SITE_OTHER): Payer: Medicare Other | Admitting: Cardiovascular Disease

## 2016-09-05 ENCOUNTER — Ambulatory Visit (HOSPITAL_COMMUNITY): Payer: Medicare Other | Attending: Cardiovascular Disease

## 2016-09-05 VITALS — BP 118/68 | HR 68 | Ht 73.0 in | Wt 210.8 lb

## 2016-09-05 DIAGNOSIS — I071 Rheumatic tricuspid insufficiency: Secondary | ICD-10-CM | POA: Insufficient documentation

## 2016-09-05 DIAGNOSIS — I517 Cardiomegaly: Secondary | ICD-10-CM | POA: Diagnosis not present

## 2016-09-05 DIAGNOSIS — I6529 Occlusion and stenosis of unspecified carotid artery: Secondary | ICD-10-CM

## 2016-09-05 DIAGNOSIS — I340 Nonrheumatic mitral (valve) insufficiency: Secondary | ICD-10-CM | POA: Diagnosis not present

## 2016-09-05 DIAGNOSIS — I350 Nonrheumatic aortic (valve) stenosis: Secondary | ICD-10-CM | POA: Diagnosis not present

## 2016-09-05 DIAGNOSIS — I2510 Atherosclerotic heart disease of native coronary artery without angina pectoris: Secondary | ICD-10-CM | POA: Insufficient documentation

## 2016-09-05 DIAGNOSIS — Z953 Presence of xenogenic heart valve: Secondary | ICD-10-CM | POA: Insufficient documentation

## 2016-09-05 DIAGNOSIS — Z952 Presence of prosthetic heart valve: Secondary | ICD-10-CM

## 2016-09-05 DIAGNOSIS — I4891 Unspecified atrial fibrillation: Secondary | ICD-10-CM | POA: Insufficient documentation

## 2016-09-05 NOTE — Patient Instructions (Signed)

## 2016-09-05 NOTE — Progress Notes (Signed)
Cardiology Office Note Date:  09/05/2016   ID:  [name] [name], DOB [birth year], MRN [id number]  PCP:  Sallee Lange, MD  Cardiologist:  Sherren Mocha, MD    Chief Complaint  Patient presents with  . Coronary Artery Disease     History of Present Illness: [name] [name] is a 82 y.o. male who presents for follow-up after undergoing TAVR 07/19/2016 for treatment of severe low-flow low-gradient aortic stenosis. He had a long hospitalization where he was treated for low-output heart failure with IV milrinone and treated for symptomatic carotid disease with carotid stenting. He ultimately was treated with TAVR using a 29 mm S3 valve delivered via a percutaneous transfemoral approach. He was ultimately weaned off of milrinone and has had an uneventful recovery. He is here today for follow-up evaluation. States that he's feeling better with no dyspnea or chest pain. No lightheadedness or syncope. He admits to be unsteady on his feet but hasn't fallen. He's back to living independently at home.    Past Medical History:  Diagnosis Date  . Aortic stenosis    a. mod-sev by echo 05/2016.  . Asthma   . Cerebrovascular disease 07/2010   TIA; carotid ultrasound in 07/2010-significant bilateral plaque without focal internal carotid artery stenosis; MRI -encephalomalacia left temporal and right temporal lobes; small inferior right cerebellar infarct; small vessel disease  . Chronic atrial fibrillation (HCC)    Paroxysmal; Echocardiogram in 2007-normal EF; mild LVH; left atrial enlargement; mild stenosis and minimal AI; negative stress nuclear study in 2008  . Chronic systolic CHF (congestive heart failure) ([place])    a. dx 05/2016 - EF 35-40%, diffuse HK, mod-severe AS, mod gradient, severe AVA VTI likely due to decreased cardiac output in setting of systolic dysfsunction and significant mitral regurgitation, mild MR, mod-severe MR, severe LAE, mild-mod RV dilation, mild RAE, mild-mod TR, mod PASP 45mmHg.  .  CKD (chronic kidney disease), stage II   . Degenerative joint disease    feet and legs  . Diabetes mellitus    no insulin; A1c of 6.6 in 2005  . Dizziness    occurs daily,especially in am  . Exertional dyspnea   . Gastroesophageal reflux disease   . Hepatic steatosis   . History of noncompliance with medical treatment   . Hyperlipidemia    adverse reactions to statins and niacin  . Hypertension    Borderline  . Irregular heartbeat   . Mitral regurgitation    a. mod-sev by echo 05/2016  . Peripheral vascular disease ([place])   . Renal insufficiency   . S/P TAVR (transcatheter aortic valve replacement) 07/19/2016   29 mm Edwards Sapien 3 transcatheter heart valve placed via left percutaneous transfemoral approach  . Temporal arteritis ([place])   . Tricuspid regurgitation    a. mild-mod TR by echo 05/2016    Past Surgical History:  Procedure Laterality Date  . COLONOSCOPY  2002  . COLONOSCOPY  01/19/2012   Procedure: COLONOSCOPY;  Surgeon: Rogene Houston, MD;  [facility];  Service: Endoscopy;  Laterality: N/A;  1030  . LIPOMA EXCISION  1980  . ORIF ANKLE FRACTURE  2000   Right  . PERIPHERAL VASCULAR CATHETERIZATION N/A 07/11/2016   Procedure: Carotid PTA/Stent Intervention;  Surgeon: Lorretta Harp, MD;  Location: [facility];  Service: Cardiovascular;  Laterality: N/A;  . PROSTATE SURGERY  12/2011  . ROTATOR CUFF REPAIR     Right  . TEE WITHOUT CARDIOVERSION N/A 06/17/2016   Procedure: TRANSESOPHAGEAL  ECHOCARDIOGRAM (TEE);  Surgeon: Jerline Pain, MD;  Location: [facility];  Service: Cardiovascular;  Laterality: N/A;  . TEE WITHOUT CARDIOVERSION N/A 07/19/2016   Procedure: TRANSESOPHAGEAL ECHOCARDIOGRAM (TEE);  Surgeon: Sherren Mocha, MD;  Location: [facility];  Service: Open Heart Surgery;  Laterality: N/A;  . TRANSCATHETER AORTIC VALVE REPLACEMENT, TRANSFEMORAL N/A 07/19/2016   Procedure: TRANSCATHETER AORTIC VALVE REPLACEMENT, TRANSFEMORAL;  Surgeon: Sherren Mocha, MD;   Location: [facility];  Service: Open Heart Surgery;  Laterality: N/A;  . TRANSURETHRAL RESECTION OF PROSTATE  09/2011  . URETHRAL STRICTURE DILATATION  1980s    Current Outpatient Prescriptions  Medication Sig Dispense Refill  . aspirin 81 MG chewable tablet Chew 1 tablet (81 mg total) by mouth daily.    . bisacodyl (DULCOLAX) 5 MG EC tablet Take 5 mg by mouth daily as needed for moderate constipation.    . bisoprolol (ZEBETA) 5 MG tablet Take 0.5 tablets (2.5 mg total) by mouth daily.    [name] docusate sodium (COLACE) 100 MG capsule Take 100 mg by mouth 2 (two) times daily.    . furosemide (LASIX) 20 MG tablet Take 1 tablet (20 mg total) by mouth daily. 30 tablet   . HYDROcodone-acetaminophen (NORCO/VICODIN) 5-325 MG tablet Take 1 tablet by mouth every 6 (six) hours as needed for moderate pain. DNE 3gm APAP in 24 hours from all sources 120 tablet 0  . losartan (COZAAR) 25 MG tablet Take 0.5 tablets (12.5 mg total) by mouth daily.    . nortriptyline (PAMELOR) 10 MG capsule Take 10 mg by mouth at bedtime.    . rosuvastatin (CRESTOR) 5 MG tablet Take 1 tablet (5 mg total) by mouth daily.    . Sodium Phosphates (FLEET ENEMA RE) Place 1 Dose rectally daily as needed (constipation). Via rectum prn daily for constipation     . spironolactone (ALDACTONE) 25 MG tablet Take 1 tablet (25 mg total) by mouth daily.    . traZODone (DESYREL) 100 MG tablet Take 1 tablet (100 mg total) by mouth at bedtime. 14 tablet 0  . warfarin (COUMADIN) 6 MG tablet Take 6 mg by mouth as directed. Take by mouth as directed by the coumadin clinic     No current facility-administered medications for this visit.     Allergies:   Ambien [zolpidem tartrate]; Lipitor [atorvastatin calcium]; Ranitidine; Simvastatin; Xanax xr [alprazolam er]; Cholestatin; and Clopidogrel bisulfate   Social History:  The patient  reports that he quit smoking about 24 years ago. His smoking use included Cigarettes. He started smoking about 66 years ago.  He has a 20.00 pack-year smoking history. His smokeless tobacco use includes Chew. He reports that he does not drink alcohol or use drugs.   Family History:  The patient's  family history includes Diabetes in his father; Stroke in his mother.    ROS:  Please see the history of present illness. All other systems are reviewed and negative.    PHYSICAL EXAM: VS:  BP 118/68   Pulse 68   Ht 6\' 1"  (1.854 m)   Wt 95.6 kg (210 lb 12.8 oz)   BMI 27.81 kg/m  , BMI Body mass index is 27.81 kg/m. GEN: Pleasant elderly male, in no acute distress  HEENT: normal  Neck: no JVD, no masses. No carotid bruits Cardiac: RRR without murmur or gallop                Respiratory:  clear to auscultation bilaterally, normal work of breathing GI: soft, nontender, nondistended, + BS  MS: no deformity or atrophy  Ext: no pretibial edema, pedal pulses 2+= bilaterally Skin: warm and dry, no rash Neuro:  Strength and sensation are intact Psych: euthymic mood, full affect  EKG:  EKG is ordered today. The ekg ordered today shows atrial fibrillation 68 bpm, nonspecific ST abnormality  Recent Labs: 06/28/2016: B Natriuretic Peptide 222.0 07/22/2016: Magnesium 1.8 07/25/2016: Hemoglobin 11.0; Platelets 213 08/01/2016: ALT 16 08/24/2016: BUN 20; Creatinine, Ser 1.19; Potassium 4.8; Sodium 140   Lipid Panel     Component Value Date/Time   CHOL 225 (H) 04/13/2016 1010   TRIG 128 04/13/2016 1010   HDL 34 (L) 04/13/2016 1010   CHOLHDL 6.6 (H) 04/13/2016 1010   CHOLHDL 5.9 08/01/2014 1003   VLDL 49 (H) 08/01/2014 1003   LDLCALC 165 (H) 04/13/2016 1010      Wt Readings from Last 3 Encounters:  09/05/16 95.6 kg (210 lb 12.8 oz)  08/09/16 90.8 kg (200 lb 4 oz)  07/23/16 92.9 kg (204 lb 12.9 oz)     Cardiac Studies Reviewed: 2D echo interpretation pending  ASSESSMENT AND PLAN: Severe, low-flow, low-gradient aortic stenosis s/p TAVR: NYHA II sx's of chronic systolic heart failure at present. Clinically stable  on ASA 81 mg and warfarin in setting of chronic AF. Echo images reviewed and his aortic valve prosthesis appears to be functioning normally with peak and mean gradients of 15 and 9 mmHg, respectively. There is mild-moderate LV systolic dysfunction and moderate MR, I think both are improved from baseline in comparing the echo studies.   Overall the patient is stable and I will see him back in one year for a repeat echo. He will keep his regular FU in the [facility] next month.   Current medicines are reviewed with the patient today.  The patient does not have concerns regarding medicines.  Labs/ tests ordered today include:  No orders of the defined types were placed in this encounter.   Disposition:   FU Dr Haroldine Laws as planned. FU Valve Clinic one year  Signed, Sherren Mocha, MD  09/05/2016 4:32 PM    [facility] [place], [place], [place]  [address] Phone: [phone]; Fax: [phone]

## 2016-09-06 DIAGNOSIS — I350 Nonrheumatic aortic (valve) stenosis: Secondary | ICD-10-CM | POA: Diagnosis not present

## 2016-09-06 DIAGNOSIS — I482 Chronic atrial fibrillation: Secondary | ICD-10-CM | POA: Diagnosis not present

## 2016-09-06 DIAGNOSIS — Z48812 Encounter for surgical aftercare following surgery on the circulatory system: Secondary | ICD-10-CM | POA: Diagnosis not present

## 2016-09-06 DIAGNOSIS — E1122 Type 2 diabetes mellitus with diabetic chronic kidney disease: Secondary | ICD-10-CM | POA: Diagnosis not present

## 2016-09-06 DIAGNOSIS — I340 Nonrheumatic mitral (valve) insufficiency: Secondary | ICD-10-CM | POA: Diagnosis not present

## 2016-09-06 DIAGNOSIS — I2510 Atherosclerotic heart disease of native coronary artery without angina pectoris: Secondary | ICD-10-CM | POA: Diagnosis not present

## 2016-09-07 ENCOUNTER — Telehealth: Payer: Self-pay | Admitting: Family Medicine

## 2016-09-07 NOTE — Telephone Encounter (Signed)
The patient has multiple diagnosis that contributed to his disability and need for assistance. The main issues are the recent open heart surgery and valve replacement causing the need for assistance as well as difficulty cognitive dysfunction which inhibits his ability to take his medications as directed on his own. The patient also has significant weakness that is due to his advancing age, diabetes, chronic back pain. The patient does get some benefit from having his wife as well as her assistant present if it wasn't for them being in the home he would have to be in a nursing home-please forward this information to the lawyer if they need to speak with me on the phone 9 willing to do so briefly

## 2016-09-07 NOTE — Telephone Encounter (Signed)
[name] [name] is an attorney retained by the patient to represent him in fighting the [place] Denial by his insurance company. She faxed a release of information and has asked for the pts most recent diagnosis. She states that she will construct a letter using the Dx and send to Dr. Nicki Reaper for review and signature.

## 2016-09-08 DIAGNOSIS — I350 Nonrheumatic aortic (valve) stenosis: Secondary | ICD-10-CM | POA: Diagnosis not present

## 2016-09-08 DIAGNOSIS — Z48812 Encounter for surgical aftercare following surgery on the circulatory system: Secondary | ICD-10-CM | POA: Diagnosis not present

## 2016-09-08 DIAGNOSIS — I482 Chronic atrial fibrillation: Secondary | ICD-10-CM | POA: Diagnosis not present

## 2016-09-08 DIAGNOSIS — E1122 Type 2 diabetes mellitus with diabetic chronic kidney disease: Secondary | ICD-10-CM | POA: Diagnosis not present

## 2016-09-08 DIAGNOSIS — I2510 Atherosclerotic heart disease of native coronary artery without angina pectoris: Secondary | ICD-10-CM | POA: Diagnosis not present

## 2016-09-08 DIAGNOSIS — I340 Nonrheumatic mitral (valve) insufficiency: Secondary | ICD-10-CM | POA: Diagnosis not present

## 2016-09-09 DIAGNOSIS — I2510 Atherosclerotic heart disease of native coronary artery without angina pectoris: Secondary | ICD-10-CM | POA: Diagnosis not present

## 2016-09-09 DIAGNOSIS — I340 Nonrheumatic mitral (valve) insufficiency: Secondary | ICD-10-CM | POA: Diagnosis not present

## 2016-09-09 DIAGNOSIS — Z48812 Encounter for surgical aftercare following surgery on the circulatory system: Secondary | ICD-10-CM | POA: Diagnosis not present

## 2016-09-09 DIAGNOSIS — I350 Nonrheumatic aortic (valve) stenosis: Secondary | ICD-10-CM | POA: Diagnosis not present

## 2016-09-09 DIAGNOSIS — I482 Chronic atrial fibrillation: Secondary | ICD-10-CM | POA: Diagnosis not present

## 2016-09-09 DIAGNOSIS — E1122 Type 2 diabetes mellitus with diabetic chronic kidney disease: Secondary | ICD-10-CM | POA: Diagnosis not present

## 2016-09-12 DIAGNOSIS — I2510 Atherosclerotic heart disease of native coronary artery without angina pectoris: Secondary | ICD-10-CM | POA: Diagnosis not present

## 2016-09-12 DIAGNOSIS — I482 Chronic atrial fibrillation: Secondary | ICD-10-CM | POA: Diagnosis not present

## 2016-09-12 DIAGNOSIS — E1122 Type 2 diabetes mellitus with diabetic chronic kidney disease: Secondary | ICD-10-CM | POA: Diagnosis not present

## 2016-09-12 DIAGNOSIS — I350 Nonrheumatic aortic (valve) stenosis: Secondary | ICD-10-CM | POA: Diagnosis not present

## 2016-09-12 DIAGNOSIS — Z48812 Encounter for surgical aftercare following surgery on the circulatory system: Secondary | ICD-10-CM | POA: Diagnosis not present

## 2016-09-12 DIAGNOSIS — I340 Nonrheumatic mitral (valve) insufficiency: Secondary | ICD-10-CM | POA: Diagnosis not present

## 2016-09-14 DIAGNOSIS — I5042 Chronic combined systolic (congestive) and diastolic (congestive) heart failure: Secondary | ICD-10-CM | POA: Diagnosis not present

## 2016-09-14 DIAGNOSIS — I2510 Atherosclerotic heart disease of native coronary artery without angina pectoris: Secondary | ICD-10-CM | POA: Diagnosis not present

## 2016-09-14 DIAGNOSIS — Z48812 Encounter for surgical aftercare following surgery on the circulatory system: Secondary | ICD-10-CM | POA: Diagnosis not present

## 2016-09-14 DIAGNOSIS — I350 Nonrheumatic aortic (valve) stenosis: Secondary | ICD-10-CM | POA: Diagnosis not present

## 2016-09-14 DIAGNOSIS — I340 Nonrheumatic mitral (valve) insufficiency: Secondary | ICD-10-CM | POA: Diagnosis not present

## 2016-09-14 DIAGNOSIS — E1122 Type 2 diabetes mellitus with diabetic chronic kidney disease: Secondary | ICD-10-CM | POA: Diagnosis not present

## 2016-09-14 DIAGNOSIS — G8929 Other chronic pain: Secondary | ICD-10-CM | POA: Diagnosis not present

## 2016-09-14 DIAGNOSIS — I482 Chronic atrial fibrillation: Secondary | ICD-10-CM | POA: Diagnosis not present

## 2016-09-14 DIAGNOSIS — N182 Chronic kidney disease, stage 2 (mild): Secondary | ICD-10-CM | POA: Diagnosis not present

## 2016-09-15 ENCOUNTER — Telehealth: Payer: Self-pay | Admitting: *Deleted

## 2016-09-15 NOTE — Telephone Encounter (Signed)
It would be fine to refill these 3 each. Also patient needs metabolic 7, hemoglobin [id number]. Potentially this could be done through advance home care diagnosis hypertension, congestive heart failure, diabetes

## 2016-09-15 NOTE — Telephone Encounter (Signed)
Fax from [facility] refills on meds last prescribed by Granville Lewis MD at [facility].   bisoprolol fumarate 5mg  #15 take one half tablet once daily at 9am  Metformin 500mg  #30 one half tablet bid at 9 am and 6 pm ( THIS MED IS NOT ON MED LIST) last dispensed 08/19/16.   Trazodone 100mg  #30 one tablet po bedtime at 8 pm  Rosuvastatin calcium 5mg  #30 one tablet at 5pm every day. Warfarin sodium 6mg  tablet. #30 one tablet at 6pm  Spironolactone 25mg  tablet #30 one tablet daily at 9 am Losartan potassium 25mg  #15 one half tablet once daily at 9 am Nortriptyline 10mg  #30 one capsule qhs at 8 pm    Last seen 08/24/16 by dr Nicki Reaper for a check up.

## 2016-09-16 ENCOUNTER — Other Ambulatory Visit: Payer: Self-pay | Admitting: *Deleted

## 2016-09-16 ENCOUNTER — Telehealth: Payer: Self-pay | Admitting: Family Medicine

## 2016-09-16 DIAGNOSIS — E1122 Type 2 diabetes mellitus with diabetic chronic kidney disease: Secondary | ICD-10-CM | POA: Diagnosis not present

## 2016-09-16 DIAGNOSIS — I350 Nonrheumatic aortic (valve) stenosis: Secondary | ICD-10-CM | POA: Diagnosis not present

## 2016-09-16 DIAGNOSIS — I2510 Atherosclerotic heart disease of native coronary artery without angina pectoris: Secondary | ICD-10-CM | POA: Diagnosis not present

## 2016-09-16 DIAGNOSIS — I482 Chronic atrial fibrillation: Secondary | ICD-10-CM | POA: Diagnosis not present

## 2016-09-16 DIAGNOSIS — Z48812 Encounter for surgical aftercare following surgery on the circulatory system: Secondary | ICD-10-CM | POA: Diagnosis not present

## 2016-09-16 DIAGNOSIS — I10 Essential (primary) hypertension: Secondary | ICD-10-CM

## 2016-09-16 DIAGNOSIS — I340 Nonrheumatic mitral (valve) insufficiency: Secondary | ICD-10-CM | POA: Diagnosis not present

## 2016-09-16 DIAGNOSIS — E119 Type 2 diabetes mellitus without complications: Secondary | ICD-10-CM

## 2016-09-16 MED ORDER — ROSUVASTATIN CALCIUM 5 MG PO TABS: 5.0000 mg | ORAL_TABLET | Freq: Every day | ORAL | 3 refills | Status: DC

## 2016-09-16 MED ORDER — TRAZODONE HCL 100 MG PO TABS: 100.0000 mg | ORAL_TABLET | Freq: Every day | ORAL | 3 refills | Status: DC

## 2016-09-16 MED ORDER — WARFARIN SODIUM 6 MG PO TABS: 6.0000 mg | ORAL_TABLET | ORAL | 3 refills | Status: DC

## 2016-09-16 MED ORDER — METFORMIN HCL 500 MG PO TABS: ORAL_TABLET | ORAL | 3 refills | Status: DC

## 2016-09-16 MED ORDER — NORTRIPTYLINE HCL 10 MG PO CAPS: 10.0000 mg | ORAL_CAPSULE | Freq: Every day | ORAL | 3 refills | Status: DC

## 2016-09-16 MED ORDER — SPIRONOLACTONE 25 MG PO TABS: 25.0000 mg | ORAL_TABLET | ORAL | 3 refills | Status: DC

## 2016-09-16 MED ORDER — LOSARTAN POTASSIUM 25 MG PO TABS: ORAL_TABLET | ORAL | 3 refills | Status: DC

## 2016-09-16 MED ORDER — BISOPROLOL FUMARATE 5 MG PO TABS: 2.5000 mg | ORAL_TABLET | Freq: Every day | ORAL | 3 refills | Status: DC

## 2016-09-16 NOTE — Telephone Encounter (Signed)
Requesting order to extend PT 2 times weekly for 3 additional weeks.

## 2016-09-16 NOTE — Telephone Encounter (Signed)
Spoke with Nurse Marzetta Board at [facility] and gave verbal orders for physical therapy to be extended 2 times weekly for 3 additional weeks. Stacy verbalized understanding.

## 2016-09-16 NOTE — Telephone Encounter (Signed)
Please do so

## 2016-09-16 NOTE — Telephone Encounter (Signed)
Refills sent to pharm. Orders for bloodwork put in. Left message to return call to let pt know about bloodwork.

## 2016-09-20 ENCOUNTER — Ambulatory Visit (HOSPITAL_COMMUNITY)
Admission: RE | Admit: 2016-09-20 | Discharge: 2016-09-20 | Disposition: A | Discharge status: Home / Self Care | Payer: Medicare Other | Source: Ambulatory Visit | Attending: Cardiology | Admitting: Cardiology

## 2016-09-20 VITALS — BP 104/62 | HR 64 | Wt 215.6 lb

## 2016-09-20 DIAGNOSIS — J45909 Unspecified asthma, uncomplicated: Secondary | ICD-10-CM | POA: Insufficient documentation

## 2016-09-20 DIAGNOSIS — I482 Chronic atrial fibrillation: Secondary | ICD-10-CM | POA: Diagnosis not present

## 2016-09-20 DIAGNOSIS — I2510 Atherosclerotic heart disease of native coronary artery without angina pectoris: Secondary | ICD-10-CM | POA: Diagnosis not present

## 2016-09-20 DIAGNOSIS — I340 Nonrheumatic mitral (valve) insufficiency: Secondary | ICD-10-CM | POA: Insufficient documentation

## 2016-09-20 DIAGNOSIS — Z7984 Long term (current) use of oral hypoglycemic drugs: Secondary | ICD-10-CM | POA: Insufficient documentation

## 2016-09-20 DIAGNOSIS — I352 Nonrheumatic aortic (valve) stenosis with insufficiency: Secondary | ICD-10-CM | POA: Insufficient documentation

## 2016-09-20 DIAGNOSIS — Z833 Family history of diabetes mellitus: Secondary | ICD-10-CM | POA: Insufficient documentation

## 2016-09-20 DIAGNOSIS — Z8673 Personal history of transient ischemic attack (TIA), and cerebral infarction without residual deficits: Secondary | ICD-10-CM | POA: Diagnosis not present

## 2016-09-20 DIAGNOSIS — I5022 Chronic systolic (congestive) heart failure: Secondary | ICD-10-CM | POA: Diagnosis not present

## 2016-09-20 DIAGNOSIS — Z7901 Long term (current) use of anticoagulants: Secondary | ICD-10-CM | POA: Diagnosis not present

## 2016-09-20 DIAGNOSIS — Z7982 Long term (current) use of aspirin: Secondary | ICD-10-CM | POA: Diagnosis not present

## 2016-09-20 DIAGNOSIS — Z87891 Personal history of nicotine dependence: Secondary | ICD-10-CM | POA: Diagnosis not present

## 2016-09-20 DIAGNOSIS — I4891 Unspecified atrial fibrillation: Secondary | ICD-10-CM

## 2016-09-20 DIAGNOSIS — I272 Pulmonary hypertension, unspecified: Secondary | ICD-10-CM | POA: Insufficient documentation

## 2016-09-20 DIAGNOSIS — K219 Gastro-esophageal reflux disease without esophagitis: Secondary | ICD-10-CM | POA: Diagnosis not present

## 2016-09-20 DIAGNOSIS — E785 Hyperlipidemia, unspecified: Secondary | ICD-10-CM | POA: Diagnosis not present

## 2016-09-20 DIAGNOSIS — Z952 Presence of prosthetic heart valve: Secondary | ICD-10-CM

## 2016-09-20 DIAGNOSIS — I739 Peripheral vascular disease, unspecified: Secondary | ICD-10-CM | POA: Insufficient documentation

## 2016-09-20 DIAGNOSIS — I679 Cerebrovascular disease, unspecified: Secondary | ICD-10-CM | POA: Insufficient documentation

## 2016-09-20 DIAGNOSIS — N182 Chronic kidney disease, stage 2 (mild): Secondary | ICD-10-CM | POA: Insufficient documentation

## 2016-09-20 DIAGNOSIS — Z800 Family history of malignant neoplasm of digestive organs: Secondary | ICD-10-CM | POA: Diagnosis not present

## 2016-09-20 DIAGNOSIS — I5042 Chronic combined systolic (congestive) and diastolic (congestive) heart failure: Secondary | ICD-10-CM

## 2016-09-20 DIAGNOSIS — E1122 Type 2 diabetes mellitus with diabetic chronic kidney disease: Secondary | ICD-10-CM | POA: Diagnosis not present

## 2016-09-20 DIAGNOSIS — I6529 Occlusion and stenosis of unspecified carotid artery: Secondary | ICD-10-CM | POA: Insufficient documentation

## 2016-09-20 DIAGNOSIS — I951 Orthostatic hypotension: Secondary | ICD-10-CM

## 2016-09-20 DIAGNOSIS — Z823 Family history of stroke: Secondary | ICD-10-CM | POA: Diagnosis not present

## 2016-09-20 LAB — BASIC METABOLIC PANEL
ANION GAP: 10 (ref 5–15)
BUN: 23 mg/dL — ABNORMAL HIGH (ref 6–20)
CALCIUM: 9.3 mg/dL (ref 8.9–10.3)
CO2: 25 mmol/L (ref 22–32)
Chloride: 101 mmol/L (ref 101–111)
Creatinine, Ser: 1.22 mg/dL (ref 0.61–1.24)
GFR, EST NON AFRICAN AMERICAN: 53 mL/min — AB (ref >60)
GLUCOSE: 132 mg/dL — AB (ref 65–99)
POTASSIUM: 3.7 mmol/L (ref 3.5–5.1)
Sodium: 136 mmol/L (ref 135–145)

## 2016-09-20 LAB — BRAIN NATRIURETIC PEPTIDE: B Natriuretic Peptide: 136.4 pg/mL — ABNORMAL HIGH (ref 0.0–100.0)

## 2016-09-20 MED ORDER — FUROSEMIDE 20 MG PO TABS: 20.0000 mg | ORAL_TABLET | Freq: Every day | ORAL | 6 refills | Status: DC

## 2016-09-20 NOTE — Progress Notes (Signed)
ADVANCED HF CLINIC NOTE  Primary Cardiologist: Dr Harl Bowie  PCP: Dr Liborio Nixon  HPI: [name] is an 82 year old male with aortic stenosis, chronic atrial fibrillation, hypertension, systolic HF, type II diabetes mellitus, hyperlipidemia, and cerebrovascular disease who underwent recent R CEA and TAVR (/03/2016).   He had echo on 06/01/2016 which revealed severe low gradient AS with LVEF 35% low ejection fraction aortic stenosis. TEE revealed moderate to severe left ventricular systolic dysfunction with ejection fraction estimated 30-35%. There was severe aortic stenosis with mild aortic insufficiency. The dimensionless velocity ratio was measured 0.2 with peak velocity across the aortic valve measured 3.2 m/s corresponding to mean transvalvular gradient estimated 22 mmHg.Diagnostic cardiac catheterization revealed moderate coronary artery disease with 75% stenosis of the mid left anterior descending coronary artery, 80% stenosis of the distal left circumflex coronary artery, and otherwise moderate nonobstructive disease. Attempts to cross the aortic valve were unsuccessful at catheterization. Right heart catheterization revealed moderate pulmonary hypertension with PA pressures measured 52/23, mean pulmonary A wedge pressure measured 25 mmHg, central venous pressure 11 mmHg, and lowcardiac output (3.2 L/m) and mixed venous oxygen saturation (52%).    He presented acutely to the emergency department 06/28/2016 with severe respiratory distress due to cardiogenic shock and rapid AF. He was admitted to the floor and the transferred to the CCU. He was treated with milrinone, IV amiodarone and lasix. Work-up also revealed high-grade R carotid stenosis He was seen by TCTS and not felt to be candidate for AVR/CABG. VVS also consulted. He eventually underwent R carotid stent followed a week later by successful TAVR by [name]. Milrinone was weaned and he was ambulated by PT and cardiac rehab. He was  dis[facility] to [facility]. His course was complicated by hematuria which resolved with abx and  conservative treatment.  He returns for HF follow up. Last visit spiro was increased to 25 mg daily. He was discharged to home from SNF about 4 weeks ago.  He has [facility] for PT. He has a caregiver from 8:00 to 2300. Denies SOB/PND/Orthopnea. Compalins of dizziness when standing. Weight at home 207 pounds. He has been taking 40 mg lasix daily. Caregiver provides all and prepares all medications. Lives with his wife.    ROS: All systems negative except as listed in HPI, PMH and Problem List. Current Outpatient Prescriptions on File Prior to Encounter  Medication Sig Dispense Refill  . aspirin 81 MG chewable tablet Chew 1 tablet (81 mg total) by mouth daily.    . bisacodyl (DULCOLAX) 5 MG EC tablet Take 5 mg by mouth daily as needed for moderate constipation.    . bisoprolol (ZEBETA) 5 MG tablet Take 0.5 tablets (2.5 mg total) by mouth daily. 15 tablet 3  . docusate sodium (COLACE) 100 MG capsule Take 100 mg by mouth 2 (two) times daily.    [name] HYDROcodone-acetaminophen (NORCO/VICODIN) 5-325 MG tablet Take 1 tablet by mouth every 6 (six) hours as needed for moderate pain. DNE 3gm APAP in 24 hours from all sources 120 tablet 0  . losartan (COZAAR) 25 MG tablet Take one half tablet daily 15 tablet 3  . metFORMIN (GLUCOPHAGE) 500 MG tablet One half tablet bid 30 tablet 3  . nortriptyline (PAMELOR) 10 MG capsule Take 1 capsule (10 mg total) by mouth at bedtime. 30 capsule 3  . rosuvastatin (CRESTOR) 5 MG tablet Take 1 tablet (5 mg total) by mouth daily. 30 tablet 3  . Sodium Phosphates (FLEET ENEMA RE) Place 1 Dose rectally daily  as needed (constipation). Via rectum prn daily for constipation     . spironolactone (ALDACTONE) 25 MG tablet Take 1 tablet (25 mg total) by mouth every morning. 30 tablet 3  . traZODone (DESYREL) 100 MG tablet Take 1 tablet (100 mg total) by mouth at bedtime. 30 tablet 3  . warfarin  (COUMADIN) 6 MG tablet Take 1 tablet (6 mg total) by mouth as directed. Take by mouth as directed by the coumadin clinic 30 tablet 3   No current facility-administered medications on file prior to encounter.    SH:  Social History   Social History  . Marital status: Married    Spouse name: N/A  . Number of children: 1  . Years of education: N/A   Occupational History  . Retired    Social History Main Topics  . Smoking status: Former Smoker    Packs/day: 1.00    Years: 20.00    Types: Cigarettes    Start date: 07/04/1950    Quit date: 04/25/1992  . Smokeless tobacco: Current User    Types: Chew  . Alcohol use No  . Drug use: No  . Sexual activity: Not Currently   Other Topics Concern  . Not on file   Social History Narrative  . No narrative on file    FH:  Family History  Problem Relation Age of Onset  . Stroke Mother   . Diabetes Father   . Colon cancer Neg Hx     Past Medical History:  Diagnosis Date  . Aortic stenosis    a. mod-sev by echo 05/2016.  . Asthma   . Cerebrovascular disease 07/2010   TIA; carotid ultrasound in 07/2010-significant bilateral plaque without focal internal carotid artery stenosis; MRI -encephalomalacia left temporal and right temporal lobes; small inferior right cerebellar infarct; small vessel disease  . Chronic atrial fibrillation (HCC)    Paroxysmal; Echocardiogram in 2007-normal EF; mild LVH; left atrial enlargement; mild stenosis and minimal AI; negative stress nuclear study in 2008  . Chronic systolic CHF (congestive heart failure) ([place])    a. dx 05/2016 - EF 35-40%, diffuse HK, mod-severe AS, mod gradient, severe AVA VTI likely due to decreased cardiac output in setting of systolic dysfsunction and significant mitral regurgitation, mild MR, mod-severe MR, severe LAE, mild-mod RV dilation, mild RAE, mild-mod TR, mod PASP 15mmHg.  . CKD (chronic kidney disease), stage II   . Degenerative joint disease    feet and legs  . Diabetes  mellitus    no insulin; A1c of 6.6 in 2005  . Dizziness    occurs daily,especially in am  . Exertional dyspnea   . Gastroesophageal reflux disease   . Hepatic steatosis   . History of noncompliance with medical treatment   . Hyperlipidemia    adverse reactions to statins and niacin  . Hypertension    Borderline  . Irregular heartbeat   . Mitral regurgitation    a. mod-sev by echo 05/2016  . Peripheral vascular disease ([place])   . Renal insufficiency   . S/P TAVR (transcatheter aortic valve replacement) 07/19/2016   29 mm Edwards Sapien 3 transcatheter heart valve placed via left percutaneous transfemoral approach  . Temporal arteritis ([place])   . Tricuspid regurgitation    a. mild-mod TR by echo 05/2016     Vitals:   09/20/16 1207  BP: 104/62  Pulse: 64  SpO2: 100%  Weight: 215 lb 9.6 oz (97.8 kg)   Sitting BP 110/60 Standing 88/60    PHYSICAL  EXAM: General:  Elderly.Sitting in wheel chair.  No respiratory distress. Nephew present.  HEENT: normal Neck: supple. JVP 7-8 Carotids 2+ bilaterally; no bruits. No lymphadenopathy or thryomegaly appreciated. Cor: PMI normal. Regular rate & rhythm. Soft SEM RSB.3/6 SEM LSB. S2 crisp Lungs: clear Abdomen: soft, nontender, nondistended. No hepatosplenomegaly. No bruits or masses. Good bowel sounds. Extremities: no cyanosis, clubbing, rash, tr-1+ edema. diffuse ecchymoses of UEs Neuro: alert & orientedx3, cranial nerves grossly intact. Moves all 4 extremities w/o difficulty. Affect pleasant.   ASSESSMENT & PLAN: 1. Chronic systolic HF - likely related to critical AS. EF 30-35%  --NYHA II. Volume status low. He is orthostatic. Hold lasix for 2 days then restart lasix 20 mg daily.   --Continue losartan 25 mg daily and bisoprolol 2.5 mg daily.   - Continue spiro to 25.mg daily  --Suspect EF will recover post-TAVR. Repeat echo in 2-3 months 2. Critical AS --S/p TAVR --07/19/2016. Had f/u with [name] 3. Chronic AF --Rate controlled.  Continue coumadin 4. Carotid stenosis --s/p R carotid stent.  Continue ASA 8 for at least 1 month. Continue warfarin and statin.  5. CAD  --Moderate by cath. No angina. Continue medical therapy 6. Orthostatic hypotension- As above holding lasix for 2 days. Encouraged to drink a little extra fluid today.    Follow up 4 weeks.    Amy Clegg NP-C  12:39 PM

## 2016-09-20 NOTE — Patient Instructions (Signed)
HOLD Lasix for two days. Then of Friday, start back at a REDUCED dose of 20 mg once daily. Rx for 20 mg tablets have been sent to your pharmacy.  Routine lab work today. Will notify you of abnormal results, otherwise no news is good news!  Follow up 2 months with Dr. Haroldine Laws.  Do the following things EVERYDAY: 1) Weigh yourself in the morning before breakfast. Write it down and keep it in a log. 2) Take your medicines as prescribed 3) Eat low salt foods-Limit salt (sodium) to 2000 mg per day.  4) Stay as active as you can everyday 5) Limit all fluids for the day to less than 2 liters

## 2016-09-20 NOTE — Progress Notes (Signed)
Advanced Heart Failure Medication Review by a Pharmacist  Does the patient  feel that his/her medications are working for him/her?  yes  Has the patient been experiencing any side effects to the medications prescribed?  no  Does the patient measure his/her own blood pressure or blood glucose at home?  yes   Does the patient have any problems obtaining medications due to transportation or finances?   no  Understanding of regimen: good Understanding of indications: good Potential of compliance: good Patient understands to avoid NSAIDs. Patient understands to avoid decongestants.  Issues to address at subsequent visits: None   Pharmacist comments:  Mr. Pawlus is a pleasant 82 yo M presenting with his son and his medication bottles. He reports good compliance with his regimen and his daughter helps with his medications. He did not have any other medication-related questions or concerns for me at this time.   Ruta Hinds. Velva Harman, PharmD, BCPS, CPP Clinical Pharmacist Pager: [phone] Phone: [phone] 09/20/2016 12:36 PM      Time with patient: 10 minutes Preparation and documentation time: 2 minutes Total time: 12 minutes

## 2016-09-21 ENCOUNTER — Encounter: Payer: Self-pay | Admitting: Family Medicine

## 2016-09-21 ENCOUNTER — Ambulatory Visit (INDEPENDENT_AMBULATORY_CARE_PROVIDER_SITE_OTHER): Payer: Medicare Other | Admitting: Family Medicine

## 2016-09-21 VITALS — BP 108/72 | Ht 73.0 in | Wt 219.0 lb

## 2016-09-21 DIAGNOSIS — I4819 Other persistent atrial fibrillation: Secondary | ICD-10-CM

## 2016-09-21 DIAGNOSIS — Z7901 Long term (current) use of anticoagulants: Secondary | ICD-10-CM

## 2016-09-21 DIAGNOSIS — Z48812 Encounter for surgical aftercare following surgery on the circulatory system: Secondary | ICD-10-CM | POA: Diagnosis not present

## 2016-09-21 DIAGNOSIS — I5043 Acute on chronic combined systolic (congestive) and diastolic (congestive) heart failure: Secondary | ICD-10-CM

## 2016-09-21 DIAGNOSIS — I350 Nonrheumatic aortic (valve) stenosis: Secondary | ICD-10-CM | POA: Diagnosis not present

## 2016-09-21 DIAGNOSIS — I6529 Occlusion and stenosis of unspecified carotid artery: Secondary | ICD-10-CM

## 2016-09-21 DIAGNOSIS — E119 Type 2 diabetes mellitus without complications: Secondary | ICD-10-CM

## 2016-09-21 DIAGNOSIS — I2510 Atherosclerotic heart disease of native coronary artery without angina pectoris: Secondary | ICD-10-CM | POA: Diagnosis not present

## 2016-09-21 DIAGNOSIS — E1122 Type 2 diabetes mellitus with diabetic chronic kidney disease: Secondary | ICD-10-CM | POA: Diagnosis not present

## 2016-09-21 DIAGNOSIS — I340 Nonrheumatic mitral (valve) insufficiency: Secondary | ICD-10-CM | POA: Diagnosis not present

## 2016-09-21 DIAGNOSIS — I482 Chronic atrial fibrillation: Secondary | ICD-10-CM | POA: Diagnosis not present

## 2016-09-21 DIAGNOSIS — I481 Persistent atrial fibrillation: Secondary | ICD-10-CM

## 2016-09-21 NOTE — Progress Notes (Signed)
   Subjective:    Patient ID: [name], male    DOB: [birth year], 82 y.o.   MRN: [id number]  Hypertension  This is a chronic problem. The current episode started more than 1 year ago. There are no compliance problems.    Patient would like to discuss dosages of Coumadin.  Medications were reviewed in detail. Overall patient is doing well with his medications. Somehow he was had 2 bottles of Coumadin one was a 6 mg the other one was 5 mg. 5 mg was from previous prescription. Luckily he is only been taking the 6 mg.  Review of Systems He does get short of breath with activity finds himself feeling somewhat stressed at times unable to do much activity at home unable to fix his food unable to take his medications and needs help with bathing. Patient relates shortness of breath denies chest pressure pain denies vomiting or diarrhea.    Objective:   Physical Exam HEENT benign neck no masses heart irregular pulse normal abdomen soft extremities no edema       Assessment & Plan:  Heart failure overall doing well currently. Continue current measures  Blood pressure decent control cardiology adjusted medication but on his medication list are to beta blockers. Patient has both medicines. I suspect that one was started in the other one was supposed to be stopped but possibly it is not that way we will try to communicate with his cardiologist to find out which one or should he be on both?  Coumadin patient needs to stick with 6 mg daily. 5 mg was canceled. Pharmacy was called and they were told to cancel this as well. Continue monthly INR.  [facility] consultation done with the patient today he will benefit from ongoing physical therapy to help strengthen him  Patient unable to care for himself currently needs nursing care at home.  Diabetes good control currently continue metformin.  Follow-up here 3 months.

## 2016-09-22 NOTE — Telephone Encounter (Signed)
Notified [name] [name] refills sent to pharm. Orders for bloodwork put in. [name] [name] verbalized understanding.

## 2016-09-23 DIAGNOSIS — Z48812 Encounter for surgical aftercare following surgery on the circulatory system: Secondary | ICD-10-CM | POA: Diagnosis not present

## 2016-09-23 DIAGNOSIS — I482 Chronic atrial fibrillation: Secondary | ICD-10-CM | POA: Diagnosis not present

## 2016-09-23 DIAGNOSIS — I340 Nonrheumatic mitral (valve) insufficiency: Secondary | ICD-10-CM | POA: Diagnosis not present

## 2016-09-23 DIAGNOSIS — I10 Essential (primary) hypertension: Secondary | ICD-10-CM | POA: Diagnosis not present

## 2016-09-23 DIAGNOSIS — I350 Nonrheumatic aortic (valve) stenosis: Secondary | ICD-10-CM | POA: Diagnosis not present

## 2016-09-23 DIAGNOSIS — E119 Type 2 diabetes mellitus without complications: Secondary | ICD-10-CM | POA: Diagnosis not present

## 2016-09-23 DIAGNOSIS — I2510 Atherosclerotic heart disease of native coronary artery without angina pectoris: Secondary | ICD-10-CM | POA: Diagnosis not present

## 2016-09-23 DIAGNOSIS — E1122 Type 2 diabetes mellitus with diabetic chronic kidney disease: Secondary | ICD-10-CM | POA: Diagnosis not present

## 2016-09-24 LAB — BASIC METABOLIC PANEL
BUN/Creatinine Ratio: 16 (ref 10–24)
BUN: 18 mg/dL (ref 8–27)
CALCIUM: 9.3 mg/dL (ref 8.6–10.2)
CHLORIDE: 98 mmol/L (ref 96–106)
CO2: 30 mmol/L — AB (ref 18–29)
Creatinine, Ser: 1.11 mg/dL (ref 0.76–1.27)
GFR calc non Af Amer: 62 mL/min/{1.73_m2} (ref >59)
GFR, EST AFRICAN AMERICAN: 71 mL/min/{1.73_m2} (ref >59)
GLUCOSE: 159 mg/dL — AB (ref 65–99)
POTASSIUM: 4.2 mmol/L (ref 3.5–5.2)
Sodium: 141 mmol/L (ref 134–144)

## 2016-09-24 LAB — HEMOGLOBIN A1C
Est. average glucose Bld gHb Est-mCnc: 157 mg/dL
HEMOGLOBIN A1C: 7.1 % — AB (ref 4.8–5.6)

## 2016-09-25 ENCOUNTER — Telehealth: Payer: Self-pay | Admitting: Family Medicine

## 2016-09-25 NOTE — Telephone Encounter (Signed)
Please let the patient and family know that I did communicate with [name]. He recommend stopping metoprolol. Continue Bisoprolol-please also call his pharmacy to cancel metoprolol. And also canceled it and Epic thank you

## 2016-09-26 DIAGNOSIS — I340 Nonrheumatic mitral (valve) insufficiency: Secondary | ICD-10-CM | POA: Diagnosis not present

## 2016-09-26 DIAGNOSIS — I350 Nonrheumatic aortic (valve) stenosis: Secondary | ICD-10-CM | POA: Diagnosis not present

## 2016-09-26 DIAGNOSIS — Z48812 Encounter for surgical aftercare following surgery on the circulatory system: Secondary | ICD-10-CM | POA: Diagnosis not present

## 2016-09-26 DIAGNOSIS — I482 Chronic atrial fibrillation: Secondary | ICD-10-CM | POA: Diagnosis not present

## 2016-09-26 DIAGNOSIS — E1122 Type 2 diabetes mellitus with diabetic chronic kidney disease: Secondary | ICD-10-CM | POA: Diagnosis not present

## 2016-09-26 DIAGNOSIS — I2510 Atherosclerotic heart disease of native coronary artery without angina pectoris: Secondary | ICD-10-CM | POA: Diagnosis not present

## 2016-09-26 NOTE — Telephone Encounter (Signed)
Left message for patient to return call 09/26/16 .( medication discontinued on patient's med list and pharmacy notified to discontinue medication)

## 2016-09-26 NOTE — Addendum Note (Signed)
Addended by: Launa Grill on: 09/26/2016 09:26 AM   Modules accepted: Orders

## 2016-09-29 ENCOUNTER — Ambulatory Visit (INDEPENDENT_AMBULATORY_CARE_PROVIDER_SITE_OTHER): Payer: Medicare Other

## 2016-09-29 DIAGNOSIS — Z7901 Long term (current) use of anticoagulants: Secondary | ICD-10-CM

## 2016-09-29 LAB — POCT INR: INR: 2.9

## 2016-09-29 NOTE — Telephone Encounter (Signed)
Spoke with patient and informed per Dr.Scott Luking-  let the patient and family know that Dr.Scott Luking did  communicate with [name]. He recommend stopping metoprolol. Continue Bisoprolol. Patient and nephew Richardson Landry verbalized understanding.

## 2016-09-29 NOTE — Patient Instructions (Signed)
Take 1 whole tablet all days

## 2016-09-29 NOTE — Telephone Encounter (Signed)
Left message return call 09/29/16 

## 2016-09-30 DIAGNOSIS — I482 Chronic atrial fibrillation: Secondary | ICD-10-CM | POA: Diagnosis not present

## 2016-09-30 DIAGNOSIS — I340 Nonrheumatic mitral (valve) insufficiency: Secondary | ICD-10-CM | POA: Diagnosis not present

## 2016-09-30 DIAGNOSIS — I2510 Atherosclerotic heart disease of native coronary artery without angina pectoris: Secondary | ICD-10-CM | POA: Diagnosis not present

## 2016-09-30 DIAGNOSIS — I350 Nonrheumatic aortic (valve) stenosis: Secondary | ICD-10-CM | POA: Diagnosis not present

## 2016-09-30 DIAGNOSIS — E1122 Type 2 diabetes mellitus with diabetic chronic kidney disease: Secondary | ICD-10-CM | POA: Diagnosis not present

## 2016-09-30 DIAGNOSIS — Z48812 Encounter for surgical aftercare following surgery on the circulatory system: Secondary | ICD-10-CM | POA: Diagnosis not present

## 2016-10-02 DIAGNOSIS — Z48812 Encounter for surgical aftercare following surgery on the circulatory system: Secondary | ICD-10-CM | POA: Diagnosis not present

## 2016-10-02 DIAGNOSIS — I2510 Atherosclerotic heart disease of native coronary artery without angina pectoris: Secondary | ICD-10-CM | POA: Diagnosis not present

## 2016-10-02 DIAGNOSIS — I350 Nonrheumatic aortic (valve) stenosis: Secondary | ICD-10-CM | POA: Diagnosis not present

## 2016-10-02 DIAGNOSIS — I340 Nonrheumatic mitral (valve) insufficiency: Secondary | ICD-10-CM | POA: Diagnosis not present

## 2016-10-02 DIAGNOSIS — E1122 Type 2 diabetes mellitus with diabetic chronic kidney disease: Secondary | ICD-10-CM | POA: Diagnosis not present

## 2016-10-02 DIAGNOSIS — I482 Chronic atrial fibrillation: Secondary | ICD-10-CM | POA: Diagnosis not present

## 2016-10-05 DIAGNOSIS — I350 Nonrheumatic aortic (valve) stenosis: Secondary | ICD-10-CM | POA: Diagnosis not present

## 2016-10-05 DIAGNOSIS — I2510 Atherosclerotic heart disease of native coronary artery without angina pectoris: Secondary | ICD-10-CM | POA: Diagnosis not present

## 2016-10-05 DIAGNOSIS — E1122 Type 2 diabetes mellitus with diabetic chronic kidney disease: Secondary | ICD-10-CM | POA: Diagnosis not present

## 2016-10-05 DIAGNOSIS — I482 Chronic atrial fibrillation: Secondary | ICD-10-CM | POA: Diagnosis not present

## 2016-10-05 DIAGNOSIS — I340 Nonrheumatic mitral (valve) insufficiency: Secondary | ICD-10-CM | POA: Diagnosis not present

## 2016-10-05 DIAGNOSIS — Z48812 Encounter for surgical aftercare following surgery on the circulatory system: Secondary | ICD-10-CM | POA: Diagnosis not present

## 2016-10-20 DIAGNOSIS — H353131 Nonexudative age-related macular degeneration, bilateral, early dry stage: Secondary | ICD-10-CM | POA: Diagnosis not present

## 2016-10-20 DIAGNOSIS — Z961 Presence of intraocular lens: Secondary | ICD-10-CM | POA: Diagnosis not present

## 2016-10-20 DIAGNOSIS — E119 Type 2 diabetes mellitus without complications: Secondary | ICD-10-CM | POA: Diagnosis not present

## 2016-10-27 ENCOUNTER — Ambulatory Visit (INDEPENDENT_AMBULATORY_CARE_PROVIDER_SITE_OTHER): Payer: Medicare Other

## 2016-10-27 DIAGNOSIS — Z7901 Long term (current) use of anticoagulants: Secondary | ICD-10-CM | POA: Diagnosis not present

## 2016-10-27 LAB — POCT INR: INR: 3.4

## 2016-11-10 ENCOUNTER — Ambulatory Visit (INDEPENDENT_AMBULATORY_CARE_PROVIDER_SITE_OTHER): Payer: Medicare Other

## 2016-11-10 DIAGNOSIS — Z7901 Long term (current) use of anticoagulants: Secondary | ICD-10-CM

## 2016-11-10 LAB — POCT INR: INR: 2.8

## 2016-11-18 IMAGING — CT CT ANGIO HEAD
1 of 12 series · 5 of 33 positions shown · IV contrast (isovue)
Comparison: MRI brain 04/03/2012, head CT 10/01/2015.

CLINICAL DATA: Carotid stenosis

EXAM:
CT ANGIOGRAPHY HEAD AND NECK
TECHNIQUE: Multidetector CT imaging of the head and neck was performed using
the standard protocol during bolus administration of intravenous
contrast. Multiplanar CT image reconstructions and MIPs were
obtained to evaluate the vascular anatomy. Carotid stenosis
measurements (when applicable) are obtained utilizing NASCET
criteria, using the distal internal carotid diameter as the
denominator.
CONTRAST:  50 mL Isovue 370

[Series 11: cta neck axial · axial · 0.39mm/px · z∈[-317,-79]mm · 5 of 362 slices shown]
[im 61/362  soft-tissue]
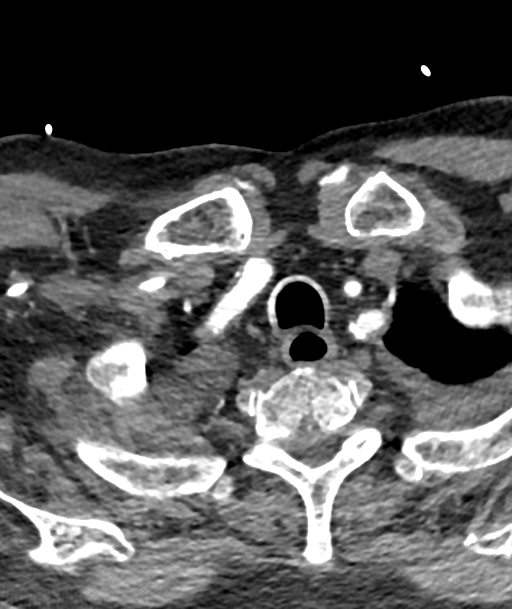
[im 121/362  bone]
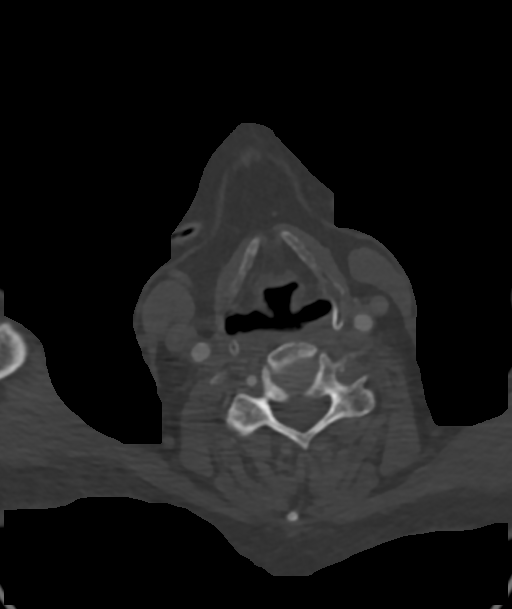
[im 181/362  soft-tissue]
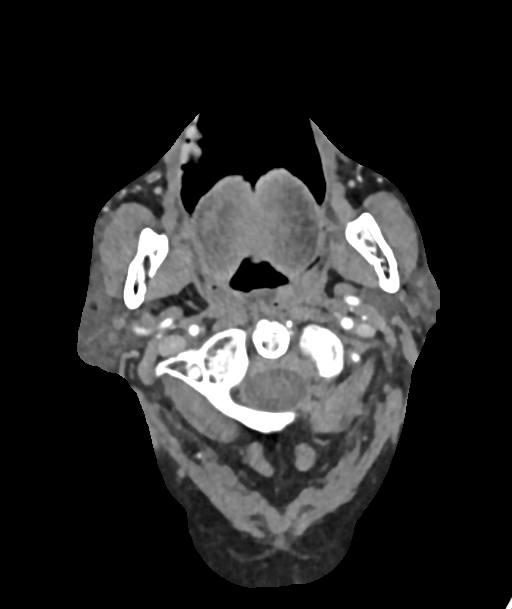
[im 241/362  bone]
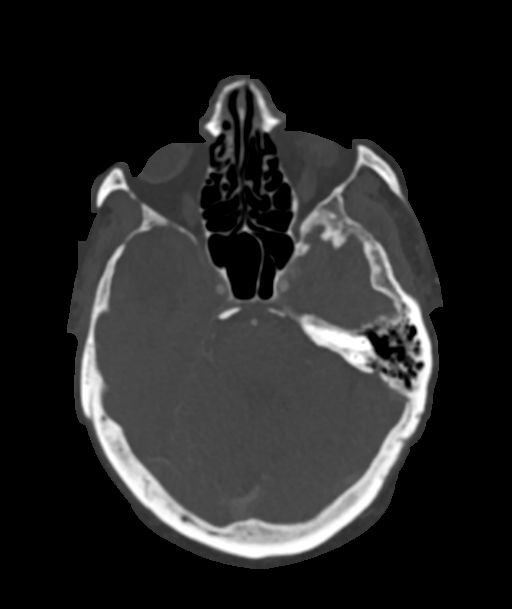
[im 301/362  soft-tissue]
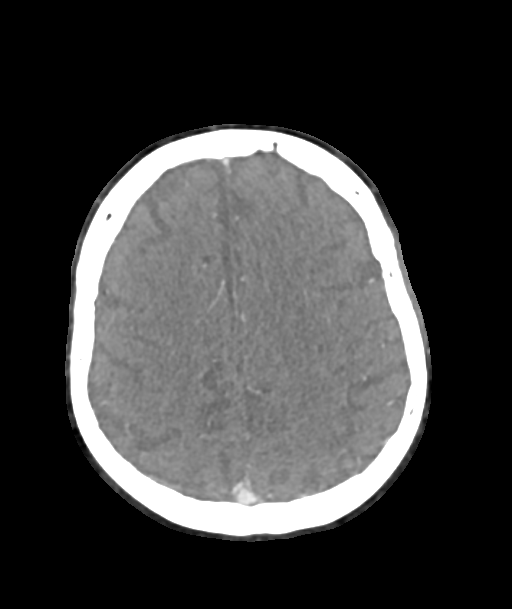

[5 of 33 positions shown; findings below may reference images not displayed]

FINDINGS: CT HEAD

Brain: No mass lesion, intraparenchymal hemorrhage or extra-axial
collection. No evidence of acute infarct. No midline shift or
hydrocephalus. There is confluent periventricular hypoattenuation
compatible with chronic microvascular disease. Left temporal
encephalomalacia is unchanged. Old right cerebellar infarct.
Ventricles and sulci are normal for patient age.

Calvarium and skull base: The skull is normal. Visualized
extracranial soft tissues are normal.

Paranasal sinuses: Normal

Orbits: Normal

CTA HEAD

Intracranial internal carotid arteries: Mild atherosclerotic
calcification at the cavernous and clinoid segments.

Anterior cerebral arteries: Normal

Middle cerebral arteries: Normal.

Posterior communicating arteries: Present on the right. Absent on
the left.

Posterior cerebral arteries: Normal.

Basilar artery: Normal.

Vertebral arteries: Right-dominant. There is loss of the normal
opacification of the left vertebral artery V4 segment distal to the
origin of the PICA.

Superior cerebellar arteries: Normal.

Anterior inferior cerebellar arteries: Not clearly visualized.

Posterior inferior cerebellar arteries: Normal.

Venous sinuses: Normal

Anatomic variants: None

Delayed phase: Unremarkable

CTA NECK

Aortic arch: There is mild atherosclerotic calcification within the
aortic arch and proximal great vessels. Normal 3 vessel
configuration. Moderate narrowing of the proximal left common
carotid artery secondary to atherosclerotic plaque. Both subclavian
arteries are patent.

Right carotid system: There is atherosclerotic plaque at the right
carotid bifurcation resulting in near complete common greater than
90% stenosis. This stenosis covers only a short segment of the
artery in the remainder of the right internal carotid artery is
normal.

Left carotid system: As above, moderate atherosclerotic narrowing of
the proximal left common carotid artery. There is atherosclerotic
plaquing calcification at the left carotid bifurcation resulting in
stenosis of approximately 70%. The remainder of the course of the
left internal carotid artery is normal.

Vertebral arteries:There is atherosclerotic plaque at the origin of
the right vertebral artery, but the origin remains clearly patent.
The course and caliber of the right vertebral artery are normal
without stenosis. There is atherosclerotic calcification of the
origin of the left vertebral artery without significant stenosis.
The left vertebral artery is diminutive along its entire course.

Skeleton: Multilevel facet arthrosis and osteophytosis. No lytic or
blastic osseous lesions.

Other neck: Numerous scattered subcentimeter mediastinal lymph
nodes. Parotid and submandibular glands are normal. The larynx is
normal. There is fullness within the left fossa of Rosenmuller.
Unremarkable thyroid

Upper chest: There are large bilateral pleural effusions and
biapical emphysema. No nodules or masses.
IMPRESSION: 1. Critical stenosis of the proximal right internal carotid artery,
secondary to the presence of atherosclerotic plaque.
2. Moderate to severe stenosis of the proximal left internal carotid
artery, measuring approximately 70%.
3. **An incidental finding of potential clinical significance has
been found. There is fullness within the left fossa of Rosenmuller,
which could indicate the presence of a nasopharyngeal mass.
Recommend correlation with direct visualization.**
4. Severe narrowing of the distal V4 segment of the left vertebral
artery, distal to the PICA origin.
5. No intracranial occlusion or advanced stenosis.
6. No acute intracranial abnormality.
7. Large bilateral pleural effusions.

## 2016-11-30 ENCOUNTER — Encounter (HOSPITAL_COMMUNITY): Payer: Medicare Other | Admitting: Internal Medicine

## 2016-12-09 ENCOUNTER — Ambulatory Visit: Payer: PPO | Admitting: Family Medicine

## 2016-12-15 ENCOUNTER — Other Ambulatory Visit: Payer: Self-pay | Admitting: Family Medicine

## 2016-12-16 ENCOUNTER — Ambulatory Visit: Payer: Medicare Other | Admitting: Family Medicine

## 2016-12-16 NOTE — Telephone Encounter (Signed)
Ok all six mo worth dr scotts name

## 2016-12-19 ENCOUNTER — Ambulatory Visit (INDEPENDENT_AMBULATORY_CARE_PROVIDER_SITE_OTHER): Payer: PPO | Admitting: Family Medicine

## 2016-12-19 ENCOUNTER — Encounter: Payer: Self-pay | Admitting: Family Medicine

## 2016-12-19 VITALS — BP 110/70 | Ht 73.0 in | Wt 222.5 lb

## 2016-12-19 DIAGNOSIS — Z7901 Long term (current) use of anticoagulants: Secondary | ICD-10-CM | POA: Diagnosis not present

## 2016-12-19 DIAGNOSIS — M79604 Pain in right leg: Secondary | ICD-10-CM | POA: Diagnosis not present

## 2016-12-19 DIAGNOSIS — I10 Essential (primary) hypertension: Secondary | ICD-10-CM | POA: Diagnosis not present

## 2016-12-19 DIAGNOSIS — I4819 Other persistent atrial fibrillation: Secondary | ICD-10-CM

## 2016-12-19 DIAGNOSIS — I481 Persistent atrial fibrillation: Secondary | ICD-10-CM | POA: Diagnosis not present

## 2016-12-19 LAB — POCT INR: INR: 4

## 2016-12-19 NOTE — Progress Notes (Signed)
   Subjective:    Patient ID: [name] [name], male    DOB: 03-31-1932, 83 y.o.   MRN: [id number]  Diabetes  He presents for his follow-up diabetic visit. There are no hypoglycemic associated symptoms. There are no diabetic associated symptoms. There are no hypoglycemic complications. There are no diabetic complications. There are no known risk factors for coronary artery disease. Current diabetic treatment includes oral agent (monotherapy). He is compliant with treatment all of the time.   Patient states that he is having trouble with his back and legs. Patient relates sharp pain on the right side of his back radiates into the leg denies any weakness numbness denies chest tightness pressure pain or shortness of breath   Results for orders placed or performed in visit on 12/19/16  POCT INR  Result Value Ref Range   INR 4.0      Review of Systems Denies chest tightness pressure pain shortness of breath denies sweats chills vomiting. Patient is debilitated needs help getting around in the help with bathing and food preparation at home denies headaches. States intermittent right side pain intermittent right leg pain uses Tylenol this seems to do well for him    Objective:   Physical Exam Heart irregular lungs clear no crackles extremities no edema blood pressure good  Diabetes under good control per patient A1c not due yet     Assessment & Plan:  Patient with intermittent chronic back pain into the right leg probable sciatica. I do not recommend any type of MRI currently  His INR shows Coumadin 2 active. Back down on dose. None for 2 days then reduced dose new guidelines given to the patient recheck INR in 1 week  Metformin tolerating well comprehensive lab work on next visit  Right leg pain probable sciatica Tylenol when necessary follow-up if ongoing trouble  Hypertension decent control continue current measures watch diet  Diabetes stable lab work on next visit  Hyperlipidemia  previous labs reviewed with patient continue current measures  Patient has follow-up with cardiology coming up

## 2016-12-23 ENCOUNTER — Telehealth (HOSPITAL_COMMUNITY): Payer: Self-pay | Admitting: Vascular Surgery

## 2016-12-23 NOTE — Telephone Encounter (Signed)
Left pt message to move appt up to earlier time

## 2016-12-26 ENCOUNTER — Ambulatory Visit (HOSPITAL_COMMUNITY)
Admission: RE | Admit: 2016-12-26 | Discharge: 2016-12-26 | Disposition: A | Discharge status: Home / Self Care | Payer: PPO | Source: Ambulatory Visit | Attending: Internal Medicine | Admitting: Internal Medicine

## 2016-12-26 ENCOUNTER — Encounter (HOSPITAL_COMMUNITY): Payer: Self-pay | Admitting: Internal Medicine

## 2016-12-26 VITALS — BP 96/54 | HR 73 | Wt 218.5 lb

## 2016-12-26 DIAGNOSIS — I352 Nonrheumatic aortic (valve) stenosis with insufficiency: Secondary | ICD-10-CM | POA: Insufficient documentation

## 2016-12-26 DIAGNOSIS — N182 Chronic kidney disease, stage 2 (mild): Secondary | ICD-10-CM | POA: Insufficient documentation

## 2016-12-26 DIAGNOSIS — I482 Chronic atrial fibrillation: Secondary | ICD-10-CM | POA: Diagnosis not present

## 2016-12-26 DIAGNOSIS — I130 Hypertensive heart and chronic kidney disease with heart failure and stage 1 through stage 4 chronic kidney disease, or unspecified chronic kidney disease: Secondary | ICD-10-CM | POA: Insufficient documentation

## 2016-12-26 DIAGNOSIS — I4891 Unspecified atrial fibrillation: Secondary | ICD-10-CM | POA: Diagnosis not present

## 2016-12-26 DIAGNOSIS — E1151 Type 2 diabetes mellitus with diabetic peripheral angiopathy without gangrene: Secondary | ICD-10-CM | POA: Diagnosis not present

## 2016-12-26 DIAGNOSIS — I6523 Occlusion and stenosis of bilateral carotid arteries: Secondary | ICD-10-CM | POA: Insufficient documentation

## 2016-12-26 DIAGNOSIS — E1122 Type 2 diabetes mellitus with diabetic chronic kidney disease: Secondary | ICD-10-CM | POA: Insufficient documentation

## 2016-12-26 DIAGNOSIS — Z8673 Personal history of transient ischemic attack (TIA), and cerebral infarction without residual deficits: Secondary | ICD-10-CM | POA: Insufficient documentation

## 2016-12-26 DIAGNOSIS — I2510 Atherosclerotic heart disease of native coronary artery without angina pectoris: Secondary | ICD-10-CM | POA: Diagnosis not present

## 2016-12-26 DIAGNOSIS — E785 Hyperlipidemia, unspecified: Secondary | ICD-10-CM | POA: Diagnosis not present

## 2016-12-26 DIAGNOSIS — Z952 Presence of prosthetic heart valve: Secondary | ICD-10-CM | POA: Diagnosis not present

## 2016-12-26 DIAGNOSIS — I517 Cardiomegaly: Secondary | ICD-10-CM | POA: Diagnosis not present

## 2016-12-26 DIAGNOSIS — K219 Gastro-esophageal reflux disease without esophagitis: Secondary | ICD-10-CM | POA: Diagnosis not present

## 2016-12-26 DIAGNOSIS — I272 Pulmonary hypertension, unspecified: Secondary | ICD-10-CM | POA: Insufficient documentation

## 2016-12-26 DIAGNOSIS — I5022 Chronic systolic (congestive) heart failure: Secondary | ICD-10-CM | POA: Diagnosis not present

## 2016-12-26 DIAGNOSIS — R0602 Shortness of breath: Secondary | ICD-10-CM | POA: Diagnosis not present

## 2016-12-26 DIAGNOSIS — J984 Other disorders of lung: Secondary | ICD-10-CM | POA: Insufficient documentation

## 2016-12-26 DIAGNOSIS — Z8249 Family history of ischemic heart disease and other diseases of the circulatory system: Secondary | ICD-10-CM | POA: Diagnosis not present

## 2016-12-26 DIAGNOSIS — I340 Nonrheumatic mitral (valve) insufficiency: Secondary | ICD-10-CM | POA: Diagnosis not present

## 2016-12-26 DIAGNOSIS — Z833 Family history of diabetes mellitus: Secondary | ICD-10-CM | POA: Insufficient documentation

## 2016-12-26 DIAGNOSIS — Z7984 Long term (current) use of oral hypoglycemic drugs: Secondary | ICD-10-CM | POA: Insufficient documentation

## 2016-12-26 DIAGNOSIS — Z7901 Long term (current) use of anticoagulants: Secondary | ICD-10-CM | POA: Insufficient documentation

## 2016-12-26 DIAGNOSIS — Z87891 Personal history of nicotine dependence: Secondary | ICD-10-CM | POA: Diagnosis not present

## 2016-12-26 LAB — BASIC METABOLIC PANEL
Anion gap: 12 (ref 5–15)
BUN: 17 mg/dL (ref 6–20)
CALCIUM: 9.7 mg/dL (ref 8.9–10.3)
CHLORIDE: 100 mmol/L — AB (ref 101–111)
CO2: 23 mmol/L (ref 22–32)
CREATININE: 1.3 mg/dL — AB (ref 0.61–1.24)
GFR calc Af Amer: 57 mL/min — ABNORMAL LOW (ref >60)
GFR, EST NON AFRICAN AMERICAN: 49 mL/min — AB (ref >60)
Glucose, Bld: 177 mg/dL — ABNORMAL HIGH (ref 65–99)
Potassium: 4 mmol/L (ref 3.5–5.1)
SODIUM: 135 mmol/L (ref 135–145)

## 2016-12-26 LAB — BRAIN NATRIURETIC PEPTIDE: B NATRIURETIC PEPTIDE 5: 146.7 pg/mL — AB (ref 0.0–100.0)

## 2016-12-26 LAB — CBC
HCT: 39.8 % (ref 39.0–52.0)
Hemoglobin: 14 g/dL (ref 13.0–17.0)
MCH: 30.4 pg (ref 26.0–34.0)
MCHC: 35.2 g/dL (ref 30.0–36.0)
MCV: 86.5 fL (ref 78.0–100.0)
PLATELETS: 189 10*3/uL (ref 150–400)
RBC: 4.6 MIL/uL (ref 4.22–5.81)
RDW: 13.3 % (ref 11.5–15.5)
WBC: 7.8 10*3/uL (ref 4.0–10.5)

## 2016-12-26 NOTE — Patient Instructions (Signed)
Labs today  A chest x-ray takes a picture of the organs and structures inside the chest, including the heart, lungs, and blood vessels. This test can show several things, including, whether the heart is enlarges; whether fluid is building up in the lungs; and whether pacemaker / defibrillator leads are still in place.  We will contact you in 4 months to schedule your next appointment.

## 2016-12-26 NOTE — Addendum Note (Signed)
Encounter addended by: Scarlette Calico, RN on: 12/26/2016  2:06 PM<BR>    Actions taken: Flowsheet accepted, Diagnosis association updated, Order list changed, Sign clinical note

## 2016-12-26 NOTE — Progress Notes (Signed)
    ReDS Vest - 12/26/16 1300      ReDS Vest   MR  Moderate   Fitting Posture Sitting   Height Marker Tall   Ruler Value 4   Center Strip Shifted   ReDS Value 32

## 2016-12-26 NOTE — Progress Notes (Signed)
ADVANCED HF CLINIC NOTE  Primary Cardiologist: Dr Harl Bowie   HPI:  [name] [name] is an 83 year old male with aortic stenosis, chronic atrial fibrillation, hypertension, systolic HF, type II diabetes mellitus, hyperlipidemia, and cerebrovascular disease who underwent recent R CEA and TAVR (/03/2016).   He presents for post-hospital f/u.   He had echo on 06/01/2016 which revealed severe low gradient AS with LVEF 35% low ejection fraction aortic stenosis. TEE revealed moderate to severe left ventricular systolic dysfunction with ejection fraction estimated 30-35%. There was severe aortic stenosis with mild aortic insufficiency. The dimensionless velocity ratio was measured 0.2 with peak velocity across the aortic valve measured 3.2 m/s corresponding to mean transvalvular gradient estimated 22 mmHg.Diagnostic cardiac catheterization revealed moderate coronary artery disease with 75% stenosis of the mid left anterior descending coronary artery, 80% stenosis of the distal left circumflex coronary artery, and otherwise moderate nonobstructive disease. Attempts to cross the aortic valve were unsuccessful at catheterization. Right heart catheterization revealed moderate pulmonary hypertension with PA pressures measured 52/23, mean pulmonary A wedge pressure measured 25 mmHg, central venous pressure 11 mmHg, and lowcardiac output (3.2 L/m) and mixed venous oxygen saturation (52%).    He presented acutely to the emergency department 06/28/2016 with severe respiratory distress due to cardiogenic shock and rapid AF. He was admitted to the floor and the transferred to the CCU. He was treated with milrinone, IV amiodarone and lasix. Work-up also revealed high-grade R carotid stenosis He was seen by TCTS and not felt to be candidate for AVR/CABG. VVS also consulted. He eventually underwent R carotid stent followed a week later by successful TAVR by [name]. Milrinone was weaned and he was ambulated by PT and  cardiac rehab. He was discharged to [facility].   Echo 10/17 EF 35-40%  He returns for follow up. Main complaint is severe back pain. Says he can't walk and has a hard time sleeping. Denies sciatic symptoms or LE weakness. Weight coming down. Now weighs 218. No orthopnea or PND. No bleeding on warfarin (follwoed by Dr. Wolfgang Phoenix). Family notes that he is more short of breath.     ROS: All systems negative except as listed in HPI, PMH and Problem List. Current Outpatient Prescriptions on File Prior to Encounter  Medication Sig Dispense Refill  . bisacodyl (DULCOLAX) 5 MG EC tablet Take 5 mg by mouth daily as needed for moderate constipation.    . bisoprolol (ZEBETA) 5 MG tablet TAKE (1/2) TABLET BY MOUTH DAILY. 15 tablet 5  . docusate sodium (COLACE) 100 MG capsule Take 100 mg by mouth 2 (two) times daily.    . furosemide (LASIX) 20 MG tablet Take 1 tablet (20 mg total) by mouth daily. 30 tablet 6  . losartan (COZAAR) 25 MG tablet TAKE (1/2) TABLET BY MOUTH DAILY. 15 tablet 5  . metFORMIN (GLUCOPHAGE) 500 MG tablet One half tablet bid 30 tablet 3  . nortriptyline (PAMELOR) 10 MG capsule TAKE 1 TO 2 CAPSULES AT BEDTIME FOR BURNING IN FEET. 60 capsule 5  . rosuvastatin (CRESTOR) 5 MG tablet TAKE (1) TABLET BY MOUTH ONCE DAILY. 30 tablet 5  . Sodium Phosphates (FLEET ENEMA RE) Place 1 Dose rectally daily as needed (constipation). Via rectum prn daily for constipation     . spironolactone (ALDACTONE) 25 MG tablet TAKE (1) TABLET DAILY IN THE MORNING. 30 tablet 5  . traZODone (DESYREL) 100 MG tablet TAKE 1 TABLET BY MOUTH AT BEDTIME. 30 tablet 5  . warfarin (COUMADIN) 6 MG tablet TAKE  1 TABLET AS DIRECTED. 30 tablet 5   No current facility-administered medications on file prior to encounter.    SH:  Social History   Social History  . Marital status: Married    Spouse name: N/A  . Number of children: 1  . Years of education: N/A   Occupational History  . Retired    Social History Main  Topics  . Smoking status: Former Smoker    Packs/day: 1.00    Years: 20.00    Types: Cigarettes    Start date: 07/04/1950    Quit date: 04/25/1992  . Smokeless tobacco: Current User    Types: Chew  . Alcohol use No  . Drug use: No  . Sexual activity: Not Currently   Other Topics Concern  . Not on file   Social History Narrative  . No narrative on file    FH:  Family History  Problem Relation Age of Onset  . Stroke Mother   . Diabetes Father   . Colon cancer Neg Hx     Past Medical History:  Diagnosis Date  . Aortic stenosis    a. mod-sev by echo 05/2016.  . Asthma   . Cerebrovascular disease 07/2010   TIA; carotid ultrasound in 07/2010-significant bilateral plaque without focal internal carotid artery stenosis; MRI -encephalomalacia left temporal and right temporal lobes; small inferior right cerebellar infarct; small vessel disease  . Chronic atrial fibrillation (HCC)    Paroxysmal; Echocardiogram in 2007-normal EF; mild LVH; left atrial enlargement; mild stenosis and minimal AI; negative stress nuclear study in 2008  . Chronic systolic CHF (congestive heart failure) ([place])    a. dx 05/2016 - EF 35-40%, diffuse HK, mod-severe AS, mod gradient, severe AVA VTI likely due to decreased cardiac output in setting of systolic dysfsunction and significant mitral regurgitation, mild MR, mod-severe MR, severe LAE, mild-mod RV dilation, mild RAE, mild-mod TR, mod PASP 72mmHg.  . CKD (chronic kidney disease), stage II   . Degenerative joint disease    feet and legs  . Diabetes mellitus    no insulin; A1c of 6.6 in 2005  . Dizziness    occurs daily,especially in am  . Exertional dyspnea   . Gastroesophageal reflux disease   . Hepatic steatosis   . History of noncompliance with medical treatment   . Hyperlipidemia    adverse reactions to statins and niacin  . Hypertension    Borderline  . Irregular heartbeat   . Mitral regurgitation    a. mod-sev by echo 05/2016  . Peripheral  vascular disease ([place])   . Renal insufficiency   . S/P TAVR (transcatheter aortic valve replacement) 07/19/2016   29 mm Edwards Sapien 3 transcatheter heart valve placed via left percutaneous transfemoral approach  . Temporal arteritis ([place])   . Tricuspid regurgitation    a. mild-mod TR by echo 05/2016     Vitals:   12/26/16 1312  BP: (!) 96/54  Pulse: 73  SpO2: 100%  Weight: 218 lb 8 oz (99.1 kg)    PHYSICAL EXAM:  General:  Elderly. Sitting in chair. Taking rapid shallow breaths HEENT: normal Neck: supple. JVP 6-7 Carotids 2+ bilaterally; no bruits. No lymphadenopathy or thryomegaly appreciated. Cor: PMI normal. Irregular no murmur S2 crisp Lungs: clear Abdomen: soft, nontender, nondistended. No hepatosplenomegaly. No bruits or masses. Good bowel sounds. Extremities: no cyanosis, clubbing, rash, trace edema. diffuse ecchymoses of UEs Neuro: alert & orientedx3, cranial nerves grossly intact. Moves all 4 extremities w/o difficulty. Affect pleasant.  ASSESSMENT & PLAN: 1. Chronic systolic HF - likely related to critical AS. EF 30-35%. Echo 10/17 EF 35-40%  --He looks visibly short of breath but says he breathes shallow because the doctor told him to.  --Volume status looks ok on exam. Will check CXR and BNP to confirm --BP soft.  --Continue losartan, bisoprolol and spiro at current dose. BP too soft to titrate --Check labs --Repeat echo at next visit 2. Critical AS --S/p TAVR 9/17. Valve looks great on echo.  3. Chronic AF --Rate controlled. Continue coumadin 4. Carotid stenosis --s/p R carotid stent. Asx. Continue ASA 8 for at least 1 month. Continue warfarin and statin.  5. CAD --Moderate by cath. No angina. Continue medical therapy  [name] Miranda,MD 1:29 PM

## 2016-12-27 ENCOUNTER — Ambulatory Visit: Payer: PPO

## 2016-12-28 ENCOUNTER — Telehealth: Payer: Self-pay | Admitting: *Deleted

## 2016-12-28 ENCOUNTER — Ambulatory Visit (INDEPENDENT_AMBULATORY_CARE_PROVIDER_SITE_OTHER): Payer: PPO | Admitting: *Deleted

## 2016-12-28 DIAGNOSIS — Z7901 Long term (current) use of anticoagulants: Secondary | ICD-10-CM | POA: Diagnosis not present

## 2016-12-28 DIAGNOSIS — M549 Dorsalgia, unspecified: Secondary | ICD-10-CM

## 2016-12-28 LAB — POCT INR: INR: 1.4

## 2016-12-28 NOTE — Telephone Encounter (Signed)
Wants referral to ortho for back pain. Pt aware it will be in [employer]

## 2016-12-29 NOTE — Telephone Encounter (Signed)
I would recommend Dr. Regan Rakers

## 2016-12-30 NOTE — Telephone Encounter (Signed)
Referral ordered in [facility]. 

## 2017-01-11 ENCOUNTER — Ambulatory Visit: Payer: PPO

## 2017-01-13 DIAGNOSIS — M5441 Lumbago with sciatica, right side: Secondary | ICD-10-CM | POA: Diagnosis not present

## 2017-01-13 DIAGNOSIS — G8929 Other chronic pain: Secondary | ICD-10-CM | POA: Diagnosis not present

## 2017-01-17 ENCOUNTER — Ambulatory Visit (INDEPENDENT_AMBULATORY_CARE_PROVIDER_SITE_OTHER): Payer: PPO | Admitting: *Deleted

## 2017-01-17 DIAGNOSIS — Z7901 Long term (current) use of anticoagulants: Secondary | ICD-10-CM | POA: Diagnosis not present

## 2017-01-17 LAB — POCT INR: INR: 2.6

## 2017-01-17 NOTE — Patient Instructions (Signed)
Take one tablet on sundays, mondays, wednesdays, fridays, and take one half tablet on tuesdays, thursdays, and saturdays. Recheck INR in 4 weeks 

## 2017-01-26 DIAGNOSIS — M5441 Lumbago with sciatica, right side: Secondary | ICD-10-CM | POA: Diagnosis not present

## 2017-01-26 DIAGNOSIS — G8929 Other chronic pain: Secondary | ICD-10-CM | POA: Diagnosis not present

## 2017-01-30 DIAGNOSIS — M5441 Lumbago with sciatica, right side: Secondary | ICD-10-CM | POA: Diagnosis not present

## 2017-01-30 DIAGNOSIS — G8929 Other chronic pain: Secondary | ICD-10-CM | POA: Diagnosis not present

## 2017-02-01 ENCOUNTER — Encounter: Payer: Self-pay | Admitting: Family Medicine

## 2017-02-08 ENCOUNTER — Observation Stay (HOSPITAL_COMMUNITY)
Admission: EM | Admit: 2017-02-08 | Discharge: 2017-02-09 | Disposition: A | Discharge status: Home / Self Care | Payer: PPO | Attending: Internal Medicine | Admitting: Internal Medicine

## 2017-02-08 DIAGNOSIS — R10811 Right upper quadrant abdominal tenderness: Secondary | ICD-10-CM | POA: Diagnosis not present

## 2017-02-08 DIAGNOSIS — M79603 Pain in arm, unspecified: Secondary | ICD-10-CM | POA: Diagnosis not present

## 2017-02-08 DIAGNOSIS — M79601 Pain in right arm: Secondary | ICD-10-CM | POA: Diagnosis not present

## 2017-02-08 DIAGNOSIS — I10 Essential (primary) hypertension: Secondary | ICD-10-CM | POA: Diagnosis present

## 2017-02-08 DIAGNOSIS — R778 Other specified abnormalities of plasma proteins: Secondary | ICD-10-CM

## 2017-02-08 DIAGNOSIS — Z952 Presence of prosthetic heart valve: Secondary | ICD-10-CM

## 2017-02-08 DIAGNOSIS — S3991XA Unspecified injury of abdomen, initial encounter: Secondary | ICD-10-CM | POA: Diagnosis not present

## 2017-02-08 DIAGNOSIS — Y999 Unspecified external cause status: Secondary | ICD-10-CM | POA: Insufficient documentation

## 2017-02-08 DIAGNOSIS — S0990XA Unspecified injury of head, initial encounter: Secondary | ICD-10-CM | POA: Diagnosis not present

## 2017-02-08 DIAGNOSIS — I5022 Chronic systolic (congestive) heart failure: Secondary | ICD-10-CM | POA: Diagnosis not present

## 2017-02-08 DIAGNOSIS — I509 Heart failure, unspecified: Secondary | ICD-10-CM

## 2017-02-08 DIAGNOSIS — I2510 Atherosclerotic heart disease of native coronary artery without angina pectoris: Secondary | ICD-10-CM | POA: Diagnosis not present

## 2017-02-08 DIAGNOSIS — E785 Hyperlipidemia, unspecified: Secondary | ICD-10-CM | POA: Diagnosis present

## 2017-02-08 DIAGNOSIS — M79602 Pain in left arm: Secondary | ICD-10-CM | POA: Diagnosis not present

## 2017-02-08 DIAGNOSIS — I25119 Atherosclerotic heart disease of native coronary artery with unspecified angina pectoris: Secondary | ICD-10-CM | POA: Diagnosis present

## 2017-02-08 DIAGNOSIS — N182 Chronic kidney disease, stage 2 (mild): Secondary | ICD-10-CM | POA: Diagnosis not present

## 2017-02-08 DIAGNOSIS — G9389 Other specified disorders of brain: Secondary | ICD-10-CM | POA: Insufficient documentation

## 2017-02-08 DIAGNOSIS — W19XXXA Unspecified fall, initial encounter: Secondary | ICD-10-CM | POA: Insufficient documentation

## 2017-02-08 DIAGNOSIS — F17220 Nicotine dependence, chewing tobacco, uncomplicated: Secondary | ICD-10-CM | POA: Diagnosis not present

## 2017-02-08 DIAGNOSIS — F09 Unspecified mental disorder due to known physiological condition: Secondary | ICD-10-CM

## 2017-02-08 DIAGNOSIS — Y929 Unspecified place or not applicable: Secondary | ICD-10-CM | POA: Insufficient documentation

## 2017-02-08 DIAGNOSIS — J45909 Unspecified asthma, uncomplicated: Secondary | ICD-10-CM | POA: Diagnosis not present

## 2017-02-08 DIAGNOSIS — R7989 Other specified abnormal findings of blood chemistry: Secondary | ICD-10-CM

## 2017-02-08 DIAGNOSIS — I130 Hypertensive heart and chronic kidney disease with heart failure and stage 1 through stage 4 chronic kidney disease, or unspecified chronic kidney disease: Secondary | ICD-10-CM | POA: Insufficient documentation

## 2017-02-08 DIAGNOSIS — S4992XA Unspecified injury of left shoulder and upper arm, initial encounter: Secondary | ICD-10-CM | POA: Diagnosis not present

## 2017-02-08 DIAGNOSIS — Z7901 Long term (current) use of anticoagulants: Secondary | ICD-10-CM | POA: Insufficient documentation

## 2017-02-08 DIAGNOSIS — I6789 Other cerebrovascular disease: Secondary | ICD-10-CM | POA: Diagnosis not present

## 2017-02-08 DIAGNOSIS — E1122 Type 2 diabetes mellitus with diabetic chronic kidney disease: Secondary | ICD-10-CM | POA: Insufficient documentation

## 2017-02-08 DIAGNOSIS — I5043 Acute on chronic combined systolic (congestive) and diastolic (congestive) heart failure: Secondary | ICD-10-CM | POA: Diagnosis present

## 2017-02-08 DIAGNOSIS — R0609 Other forms of dyspnea: Secondary | ICD-10-CM | POA: Diagnosis present

## 2017-02-08 DIAGNOSIS — R0781 Pleurodynia: Secondary | ICD-10-CM | POA: Insufficient documentation

## 2017-02-08 DIAGNOSIS — S299XXA Unspecified injury of thorax, initial encounter: Secondary | ICD-10-CM | POA: Diagnosis not present

## 2017-02-08 DIAGNOSIS — R531 Weakness: Secondary | ICD-10-CM | POA: Diagnosis not present

## 2017-02-08 DIAGNOSIS — I4891 Unspecified atrial fibrillation: Secondary | ICD-10-CM | POA: Diagnosis present

## 2017-02-08 DIAGNOSIS — I5023 Acute on chronic systolic (congestive) heart failure: Secondary | ICD-10-CM

## 2017-02-08 DIAGNOSIS — Y939 Activity, unspecified: Secondary | ICD-10-CM | POA: Insufficient documentation

## 2017-02-08 DIAGNOSIS — E1165 Type 2 diabetes mellitus with hyperglycemia: Secondary | ICD-10-CM

## 2017-02-08 NOTE — ED Triage Notes (Signed)
Pt reports he fell on Monday, states he thinks he fell onto his right ribcage.  Today, patient states he had some brief pain to his left upper arm and right lower arm.  Pt denies pain at this time.

## 2017-02-09 ENCOUNTER — Encounter (HOSPITAL_COMMUNITY): Payer: Self-pay

## 2017-02-09 ENCOUNTER — Emergency Department (HOSPITAL_COMMUNITY): Payer: PPO

## 2017-02-09 ENCOUNTER — Observation Stay (HOSPITAL_BASED_OUTPATIENT_CLINIC_OR_DEPARTMENT_OTHER): Payer: PPO

## 2017-02-09 DIAGNOSIS — S0990XA Unspecified injury of head, initial encounter: Secondary | ICD-10-CM | POA: Diagnosis not present

## 2017-02-09 DIAGNOSIS — F09 Unspecified mental disorder due to known physiological condition: Secondary | ICD-10-CM

## 2017-02-09 DIAGNOSIS — R748 Abnormal levels of other serum enzymes: Secondary | ICD-10-CM | POA: Diagnosis not present

## 2017-02-09 DIAGNOSIS — Z952 Presence of prosthetic heart valve: Secondary | ICD-10-CM

## 2017-02-09 DIAGNOSIS — I509 Heart failure, unspecified: Secondary | ICD-10-CM

## 2017-02-09 DIAGNOSIS — S299XXA Unspecified injury of thorax, initial encounter: Secondary | ICD-10-CM | POA: Diagnosis not present

## 2017-02-09 DIAGNOSIS — I5022 Chronic systolic (congestive) heart failure: Secondary | ICD-10-CM

## 2017-02-09 DIAGNOSIS — R0600 Dyspnea, unspecified: Secondary | ICD-10-CM | POA: Diagnosis not present

## 2017-02-09 DIAGNOSIS — I5023 Acute on chronic systolic (congestive) heart failure: Secondary | ICD-10-CM | POA: Diagnosis not present

## 2017-02-09 DIAGNOSIS — E1165 Type 2 diabetes mellitus with hyperglycemia: Secondary | ICD-10-CM | POA: Diagnosis not present

## 2017-02-09 DIAGNOSIS — R0609 Other forms of dyspnea: Secondary | ICD-10-CM

## 2017-02-09 DIAGNOSIS — I5043 Acute on chronic combined systolic (congestive) and diastolic (congestive) heart failure: Secondary | ICD-10-CM

## 2017-02-09 DIAGNOSIS — S3991XA Unspecified injury of abdomen, initial encounter: Secondary | ICD-10-CM | POA: Diagnosis not present

## 2017-02-09 DIAGNOSIS — I447 Left bundle-branch block, unspecified: Secondary | ICD-10-CM | POA: Diagnosis not present

## 2017-02-09 DIAGNOSIS — R0781 Pleurodynia: Secondary | ICD-10-CM | POA: Diagnosis not present

## 2017-02-09 DIAGNOSIS — I481 Persistent atrial fibrillation: Secondary | ICD-10-CM

## 2017-02-09 DIAGNOSIS — R7989 Other specified abnormal findings of blood chemistry: Secondary | ICD-10-CM

## 2017-02-09 DIAGNOSIS — R778 Other specified abnormalities of plasma proteins: Secondary | ICD-10-CM

## 2017-02-09 HISTORY — DX: Heart failure, unspecified: I50.9

## 2017-02-09 HISTORY — DX: Acute on chronic systolic (congestive) heart failure: I50.23

## 2017-02-09 LAB — COMPREHENSIVE METABOLIC PANEL
ALBUMIN: 3.8 g/dL (ref 3.5–5.0)
ALK PHOS: 69 U/L (ref 38–126)
ALT: 22 U/L (ref 17–63)
ANION GAP: 12 (ref 5–15)
AST: 25 U/L (ref 15–41)
BILIRUBIN TOTAL: 0.9 mg/dL (ref 0.3–1.2)
BUN: 21 mg/dL — ABNORMAL HIGH (ref 6–20)
CALCIUM: 9.5 mg/dL (ref 8.9–10.3)
CO2: 21 mmol/L — ABNORMAL LOW (ref 22–32)
Chloride: 97 mmol/L — ABNORMAL LOW (ref 101–111)
Creatinine, Ser: 1.31 mg/dL — ABNORMAL HIGH (ref 0.61–1.24)
GFR calc Af Amer: 57 mL/min — ABNORMAL LOW (ref >60)
GFR calc non Af Amer: 49 mL/min — ABNORMAL LOW (ref >60)
GLUCOSE: 159 mg/dL — AB (ref 65–99)
Potassium: 3.5 mmol/L (ref 3.5–5.1)
Sodium: 130 mmol/L — ABNORMAL LOW (ref 135–145)
TOTAL PROTEIN: 7.3 g/dL (ref 6.5–8.1)

## 2017-02-09 LAB — CBC WITH DIFFERENTIAL/PLATELET
BASOS PCT: 1 %
Basophils Absolute: 0 10*3/uL (ref 0.0–0.1)
Eosinophils Absolute: 0.2 10*3/uL (ref 0.0–0.7)
Eosinophils Relative: 2 %
HEMATOCRIT: 41.1 % (ref 39.0–52.0)
HEMOGLOBIN: 14.8 g/dL (ref 13.0–17.0)
LYMPHS PCT: 17 %
Lymphs Abs: 1.5 10*3/uL (ref 0.7–4.0)
MCH: 31.2 pg (ref 26.0–34.0)
MCHC: 36 g/dL (ref 30.0–36.0)
MCV: 86.5 fL (ref 78.0–100.0)
MONOS PCT: 8 %
Monocytes Absolute: 0.7 10*3/uL (ref 0.1–1.0)
NEUTROS ABS: 6.1 10*3/uL (ref 1.7–7.7)
NEUTROS PCT: 72 %
Platelets: 175 10*3/uL (ref 150–400)
RBC: 4.75 MIL/uL (ref 4.22–5.81)
RDW: 13.5 % (ref 11.5–15.5)
WBC: 8.4 10*3/uL (ref 4.0–10.5)

## 2017-02-09 LAB — BRAIN NATRIURETIC PEPTIDE: B Natriuretic Peptide: 71 pg/mL (ref 0.0–100.0)

## 2017-02-09 LAB — PROTIME-INR
INR: 1.64
PROTHROMBIN TIME: 19.6 s — AB (ref 11.4–15.2)

## 2017-02-09 LAB — TROPONIN I
TROPONIN I: 0.04 ng/mL — AB (ref <0.03)
Troponin I: 0.05 ng/mL (ref <0.03)
Troponin I: 0.05 ng/mL (ref <0.03)

## 2017-02-09 LAB — GLUCOSE, CAPILLARY
GLUCOSE-CAPILLARY: 164 mg/dL — AB (ref 65–99)
Glucose-Capillary: 215 mg/dL — ABNORMAL HIGH (ref 65–99)

## 2017-02-09 LAB — ECHOCARDIOGRAM COMPLETE
Height: 73 in
WEIGHTICAEL: 3424 [oz_av]

## 2017-02-09 LAB — TSH: TSH: 15.316 u[IU]/mL — AB (ref 0.350–4.500)

## 2017-02-09 MED ORDER — DOCUSATE SODIUM 100 MG PO CAPS
100.0000 mg | ORAL_CAPSULE | Freq: Two times a day (BID) | ORAL | Status: DC
  Administered 2017-02-09: 100 mg via ORAL
  Filled 2017-02-09: qty 1

## 2017-02-09 MED ORDER — FUROSEMIDE 10 MG/ML IJ SOLN
40.0000 mg | Freq: Two times a day (BID) | INTRAMUSCULAR | Status: DC
  Administered 2017-02-09: 40 mg via INTRAVENOUS
  Filled 2017-02-09: qty 4

## 2017-02-09 MED ORDER — BISOPROLOL FUMARATE 5 MG PO TABS
5.0000 mg | ORAL_TABLET | Freq: Every day | ORAL | Status: DC
  Administered 2017-02-09: 5 mg via ORAL
  Filled 2017-02-09: qty 1

## 2017-02-09 MED ORDER — FUROSEMIDE 20 MG PO TABS: 20.0000 mg | ORAL_TABLET | Freq: Every day | ORAL | Status: DC

## 2017-02-09 MED ORDER — ENOXAPARIN SODIUM 100 MG/ML ~~LOC~~ SOLN
1.0000 mg/kg | Freq: Once | SUBCUTANEOUS | Status: AC
  Administered 2017-02-09: 100 mg via SUBCUTANEOUS
  Filled 2017-02-09: qty 1

## 2017-02-09 MED ORDER — ROSUVASTATIN CALCIUM 10 MG PO TABS
5.0000 mg | ORAL_TABLET | Freq: Every day | ORAL | Status: DC
  Administered 2017-02-09: 5 mg via ORAL
  Filled 2017-02-09: qty 1

## 2017-02-09 MED ORDER — WARFARIN SODIUM 5 MG PO TABS
6.0000 mg | ORAL_TABLET | Freq: Once | ORAL | Status: AC
  Administered 2017-02-09: 17:00:00 6 mg via ORAL
  Filled 2017-02-09: qty 1

## 2017-02-09 MED ORDER — ASPIRIN 81 MG PO CHEW
81.0000 mg | CHEWABLE_TABLET | Freq: Every day | ORAL | Status: DC
  Administered 2017-02-09: 81 mg via ORAL
  Filled 2017-02-09: qty 1

## 2017-02-09 MED ORDER — ASPIRIN 81 MG PO CHEW
324.0000 mg | CHEWABLE_TABLET | Freq: Once | ORAL | Status: AC
  Administered 2017-02-09: 324 mg via ORAL
  Filled 2017-02-09: qty 4

## 2017-02-09 MED ORDER — SITAGLIPTIN PHOSPHATE 50 MG PO TABS: 50.0000 mg | ORAL_TABLET | Freq: Every day | ORAL | 1 refills | Status: DC

## 2017-02-09 MED ORDER — WARFARIN - PHARMACIST DOSING INPATIENT: Status: DC

## 2017-02-09 MED ORDER — SODIUM CHLORIDE 0.9% FLUSH
3.0000 mL | Freq: Two times a day (BID) | INTRAVENOUS | Status: DC
  Administered 2017-02-09: 3 mL via INTRAVENOUS

## 2017-02-09 MED ORDER — INSULIN ASPART 100 UNIT/ML ~~LOC~~ SOLN: 0.0000 [IU] | Freq: Every day | SUBCUTANEOUS | Status: DC

## 2017-02-09 MED ORDER — INSULIN ASPART 100 UNIT/ML ~~LOC~~ SOLN
0.0000 [IU] | Freq: Three times a day (TID) | SUBCUTANEOUS | Status: DC
  Administered 2017-02-09: 2 [IU] via SUBCUTANEOUS
  Administered 2017-02-09: 3 [IU] via SUBCUTANEOUS

## 2017-02-09 MED ORDER — LOSARTAN POTASSIUM 50 MG PO TABS
25.0000 mg | ORAL_TABLET | Freq: Every day | ORAL | Status: DC
  Administered 2017-02-09: 25 mg via ORAL
  Filled 2017-02-09: qty 1

## 2017-02-09 MED ORDER — BISACODYL 5 MG PO TBEC: 5.0000 mg | DELAYED_RELEASE_TABLET | Freq: Every day | ORAL | Status: DC | PRN

## 2017-02-09 MED ORDER — FUROSEMIDE 10 MG/ML IJ SOLN
20.0000 mg | Freq: Once | INTRAMUSCULAR | Status: AC
  Administered 2017-02-09: 20 mg via INTRAVENOUS
  Filled 2017-02-09: qty 2

## 2017-02-09 MED ORDER — TRAZODONE HCL 50 MG PO TABS: 50.0000 mg | ORAL_TABLET | Freq: Every day | ORAL | Status: DC

## 2017-02-09 MED ORDER — IOPAMIDOL (ISOVUE-300) INJECTION 61%
100.0000 mL | Freq: Once | INTRAVENOUS | Status: AC | PRN
  Administered 2017-02-09: 100 mL via INTRAVENOUS

## 2017-02-09 MED ORDER — PANTOPRAZOLE SODIUM 40 MG PO TBEC
40.0000 mg | DELAYED_RELEASE_TABLET | Freq: Every day | ORAL | Status: DC
  Administered 2017-02-09: 40 mg via ORAL
  Filled 2017-02-09: qty 1

## 2017-02-09 NOTE — Discharge Summary (Signed)
Physician Discharge Summary  [name] [name] [id number] DOB: 06/03/1932 DOA: 02/08/2017  PCP: Sallee Lange, MD  Admit date: 02/08/2017 Discharge date: 02/09/2017  Admitted From: Home Disposition:  Home   Recommendations for Outpatient Follow-up:  1. Follow up with PCP in 1-2 weeks 2. Please obtain INR on 02/14/17 and adjust coumadin dose for INR 2-3   Home Health: YES Equipment/Devices: PT  Discharge Condition: Stable CODE STATUS: FULL Diet recommendation: Heart Healthy   Brief/Interim Summary: 83 year old male with history of aortic stenosis, chronic A. fib ablation, hypertension, systolic CHF, diabetes mellitus type 2 presents with one-day history of shortness of breath and bilateral arm pain. The patient is a poor historian. Much of the history is obtained from review of the medical record. The patient states that he had a mechanical fall on 02/06/2017 walking back to his house after mowing the lawn. He states that he hit his right thorax and abdomen. He denies hitting his head or syncope.  CT of the abdomen was negative for any traumatic abnormalities and rib xrays were negative.  The pt was started on IV lasix with good clinical effect.  Regarding his cardiac history, the patient presented to the hospital on 02/27/2016 in cardiogenic shock secondary to severe aortic stenosis. He was placed on milrinone and intravenous furosemide. During that hospitalization, the patient underwent a right carotid stent on 07/11/2016 and subsequently underwent TAVR on 07/19/2016. His most recent follow-up with Dr. Haroldine Laws showed his diary to be 218 pounds. He was continued on his current cardiac regimen of bisoprolol, spironolactone, and furosemide.  Discharge Diagnoses:  Acute on chronic systolic CHF -The patient appears euvolemic this morning (02/09/2017) -He has no peripheral edema or JVD on examination -Change IV lasix to po home dose -his initial dyspnea may have been partly attributable to  metabolic acidosis -His previous dry weight was 218 pounds -02/09/2017 weight 214 -09/05/2016 echo EF 35-40 percent, PA SP 34, mild to moderate MR, mild TR -Continue bisoprolol, spironolactone,  losartan  New left bundle branch block -consult cardiology--LBBB on EKG, subacute and not consistent with acute cardiac pathology. EKG from 08/2016 incomplete LBBB QRS 116ms that has progressed -personally reviewed EKG--afib, LBBB  Chronic atrial fibrillation -Rate controlled -Continue warfarin -Continue bisoprolol  Elevated troponin -due to CHF/demand ischemia  Severe aortic stenosis -Status post TAVR 07/19/2016  Diabetes mellitus type 2 -Repeat hemoglobin A1c -09/23/2016 hemoglobin A1c 7.1 -Given his CKD stage III, I would hesitate to restart the patient's metformin in the setting of his metabolic acidosis -Concerned that his metformin may have partly contributed to his metabolic acidosis -Continue NovoLog sliding scale for now -home with januvia 50 mg daily -f/u with PCP  Hyperlipidemia -Continue statin  Carotid stenosis -Status post right carotid stent 07/11/2016   Discharge Instructions  Discharge Instructions    Diet - low sodium heart healthy    Complete by:  As directed    Increase activity slowly    Complete by:  As directed      Allergies as of 02/09/2017      Reactions   Ambien [zolpidem Tartrate] Other (See Comments)   Sleep walks   Lipitor [atorvastatin Calcium] Other (See Comments)   myalgias   Ranitidine Other (See Comments)   Chest discomfort   Simvastatin Other (See Comments)   Myalgias   Xanax Xr [alprazolam Er] Other (See Comments)   Tightness in chest   Cholestatin Other (See Comments)   UNSPECIFIED REACTION    Clopidogrel Bisulfate Rash  Medication List    STOP taking these medications   metFORMIN 500 MG tablet Commonly known as:  GLUCOPHAGE     TAKE these medications   bisacodyl 5 MG EC tablet Commonly known as:   DULCOLAX Take 5 mg by mouth daily as needed for moderate constipation.   bisoprolol 5 MG tablet Commonly known as:  ZEBETA TAKE (1/2) TABLET BY MOUTH DAILY.   docusate sodium 100 MG capsule Commonly known as:  COLACE Take 100 mg by mouth 2 (two) times daily.   FLEET ENEMA RE Place 1 Dose rectally daily as needed (constipation). Via rectum prn daily for constipation   furosemide 20 MG tablet Commonly known as:  LASIX Take 1 tablet (20 mg total) by mouth daily.   losartan 25 MG tablet Commonly known as:  COZAAR TAKE (1/2) TABLET BY MOUTH DAILY.   nortriptyline 10 MG capsule Commonly known as:  PAMELOR TAKE 1 TO 2 CAPSULES AT BEDTIME FOR BURNING IN FEET.   rosuvastatin 5 MG tablet Commonly known as:  CRESTOR TAKE (1) TABLET BY MOUTH ONCE DAILY.   sitaGLIPtin 50 MG tablet Commonly known as:  JANUVIA Take 1 tablet (50 mg total) by mouth daily.   spironolactone 25 MG tablet Commonly known as:  ALDACTONE TAKE (1) TABLET DAILY IN THE MORNING.   traZODone 100 MG tablet Commonly known as:  DESYREL TAKE 1 TABLET BY MOUTH AT BEDTIME.   warfarin 6 MG tablet Commonly known as:  COUMADIN TAKE 1 TABLET AS DIRECTED.       Allergies  Allergen Reactions  . Ambien [Zolpidem Tartrate] Other (See Comments)    Sleep walks  . Lipitor [Atorvastatin Calcium] Other (See Comments)    myalgias  . Ranitidine Other (See Comments)    Chest discomfort  . Simvastatin Other (See Comments)    Myalgias  . Xanax Xr [Alprazolam Er] Other (See Comments)    Tightness in chest  . Cholestatin Other (See Comments)    UNSPECIFIED REACTION   . Clopidogrel Bisulfate Rash    Consultations:  cardiology   Procedures/Studies: Dg Ribs Unilateral W/chest Right  Result Date: 02/09/2017 CLINICAL DATA:  Status post fall onto right rib cage, with anterior lower rib pain. Shortness of breath. Initial encounter. EXAM: RIGHT RIBS AND CHEST - 3+ VIEW COMPARISON:  Chest radiograph performed 12/26/2016  FINDINGS: No displaced rib fractures are seen. The lungs are well-aerated. Minimal bibasilar atelectasis is noted. Peribronchial thickening is noted. There is no evidence of pleural effusion or pneumothorax. The cardiomediastinal silhouette is within normal limits. An aortic valve replacement is noted. No acute osseous abnormalities are seen. IMPRESSION: 1. No displaced rib fracture seen. 2. Minimal bibasilar atelectasis.  Peribronchial thickening noted. Electronically Signed   By: Garald Balding M.D.   On: 02/09/2017 01:29   Ct Head Wo Contrast  Result Date: 02/09/2017 CLINICAL DATA:  83 y/o  M; status post fall. EXAM: CT HEAD WITHOUT CONTRAST TECHNIQUE: Contiguous axial images were obtained from the base of the skull through the vertex without intravenous contrast. COMPARISON:  07/02/2016 CT of the head. FINDINGS: Brain: No evidence of acute infarction, hemorrhage, hydrocephalus, extra-axial collection or mass lesion/mass effect. Stable small chronic infarcts within the right anterior temporal lobe, left lateral temporal lobe, and bilateral cerebellar hemispheres. Stable bilateral cerebellar hemisphere dystrophic calcification. Stable small lucencies in bilateral thalamus probably representing chronic lacunar infarcts. Stable mild diffuse brain parenchymal volume loss. Vascular: Moderate calcific atherosclerosis of cavernous and paraclinoid internal carotid arteries. Skull: Normal. Negative for fracture or focal  lesion. Sinuses/Orbits: No acute finding. Other: Bilateral intra-ocular lens replacement. IMPRESSION: 1. No acute intracranial abnormality identified. No displaced calvarial fracture. 2. Stable chronic infarcts in bilateral temporal lobes, cerebellar hemispheres, and basal ganglia. Electronically Signed   By: Kristine Garbe M.D.   On: 02/09/2017 02:27   Ct Abdomen Pelvis W Contrast  Result Date: 02/09/2017 CLINICAL DATA:  Fall 2 days ago on anticoagulation.  Arm pain. EXAM: CT ABDOMEN AND  PELVIS WITH CONTRAST TECHNIQUE: Multidetector CT imaging of the abdomen and pelvis was performed using the standard protocol following bolus administration of intravenous contrast. CONTRAST:  146mL ISOVUE-300 IOPAMIDOL (ISOVUE-300) INJECTION 61% COMPARISON:  CTA 07/05/2016 FINDINGS: Lower chest: Mild motion artifact. No pleural fluid. No consolidation. Postsurgical change of the aortic valve, partially included. Hepatobiliary: No hepatic injury or perihepatic hematoma. Gallbladder is unremarkable Pancreas: Pancreatic atrophy.  No ductal dilatation or inflammation. Spleen: No splenic injury or perisplenic hematoma. Adrenals/Urinary Tract: No adrenal hemorrhage or renal injury identified. Bilateral renal cysts. Extrarenal pelvis configuration on the right. A 2.3 cm lesion in the right kidney was previously intermediate attenuation, currently measures simple fluid and is consistent with simple cyst. Questionable mild bladder wall thickening. No evidence of bladder injury. Stomach/Bowel: No evidence of bowel wall thickening or inflammation. No bowel injury. Rather extensive colonic diverticulosis in the sigmoid colon, lesser diverticulosis elsewhere. No diverticular inflammation. Normal appendix. Small bowel and stomach are unremarkable. No mesenteric hematoma. Vascular/Lymphatic: Abdominal aorta and IVC are intact. Moderate aortic atherosclerosis. No retroperitoneal fluid. No adenopathy. Reproductive: TURP defect in the prostate. Other: No free air, free fluid, or intra-abdominal fluid collection. No confluent body wall contusion. Musculoskeletal: No acute fracture of the included lower ribs, bony pelvis, or spine. Chronic compression fracture of L2, without progression. IMPRESSION: 1. No acute abnormality or evidence of traumatic injury to the abdomen or pelvis. 2. Previously questioned indeterminate lesion in the right kidney represents a simple cyst. 3. Aortic atherosclerosis.  No aneurysm. 4. Additional stable  chronic and nontraumatic findings as described. Electronically Signed   By: Jeb Levering M.D.   On: 02/09/2017 02:31        Discharge Exam: Vitals:   02/09/17 0430 02/09/17 0515  BP: (!) 142/92 (!) 145/68  Pulse: 75 60  Resp: 13 16  Temp:  97.9 F (36.6 C)   Vitals:   02/09/17 0400 02/09/17 0430 02/09/17 0515 02/09/17 0600  BP: (!) 142/106 (!) 142/92 (!) 145/68   Pulse: 72 75 60   Resp: 17 13 16    Temp:   97.9 F (36.6 C)   TempSrc:   Oral   SpO2: 100% 94% 98%   Weight:    97.1 kg (214 lb)  Height:    6\' 1"  (1.854 m)    General: Pt is alert, awake, not in acute distress Cardiovascular: RRR, S1/S2 +, no rubs, no gallops Respiratory: CTA bilaterally, no wheezing, no rhonchi Abdominal: Soft, NT, ND, bowel sounds + Extremities: no edema, no cyanosis   The results of significant diagnostics from this hospitalization (including imaging, microbiology, ancillary and laboratory) are listed below for reference.    Significant Diagnostic Studies: Dg Ribs Unilateral W/chest Right  Result Date: 02/09/2017 CLINICAL DATA:  Status post fall onto right rib cage, with anterior lower rib pain. Shortness of breath. Initial encounter. EXAM: RIGHT RIBS AND CHEST - 3+ VIEW COMPARISON:  Chest radiograph performed 12/26/2016 FINDINGS: No displaced rib fractures are seen. The lungs are well-aerated. Minimal bibasilar atelectasis is noted. Peribronchial thickening is noted. There is no  evidence of pleural effusion or pneumothorax. The cardiomediastinal silhouette is within normal limits. An aortic valve replacement is noted. No acute osseous abnormalities are seen. IMPRESSION: 1. No displaced rib fracture seen. 2. Minimal bibasilar atelectasis.  Peribronchial thickening noted. Electronically Signed   By: Garald Balding M.D.   On: 02/09/2017 01:29   Ct Head Wo Contrast  Result Date: 02/09/2017 CLINICAL DATA:  83 y/o  M; status post fall. EXAM: CT HEAD WITHOUT CONTRAST TECHNIQUE: Contiguous axial  images were obtained from the base of the skull through the vertex without intravenous contrast. COMPARISON:  07/02/2016 CT of the head. FINDINGS: Brain: No evidence of acute infarction, hemorrhage, hydrocephalus, extra-axial collection or mass lesion/mass effect. Stable small chronic infarcts within the right anterior temporal lobe, left lateral temporal lobe, and bilateral cerebellar hemispheres. Stable bilateral cerebellar hemisphere dystrophic calcification. Stable small lucencies in bilateral thalamus probably representing chronic lacunar infarcts. Stable mild diffuse brain parenchymal volume loss. Vascular: Moderate calcific atherosclerosis of cavernous and paraclinoid internal carotid arteries. Skull: Normal. Negative for fracture or focal lesion. Sinuses/Orbits: No acute finding. Other: Bilateral intra-ocular lens replacement. IMPRESSION: 1. No acute intracranial abnormality identified. No displaced calvarial fracture. 2. Stable chronic infarcts in bilateral temporal lobes, cerebellar hemispheres, and basal ganglia. Electronically Signed   By: Kristine Garbe M.D.   On: 02/09/2017 02:27   Ct Abdomen Pelvis W Contrast  Result Date: 02/09/2017 CLINICAL DATA:  Fall 2 days ago on anticoagulation.  Arm pain. EXAM: CT ABDOMEN AND PELVIS WITH CONTRAST TECHNIQUE: Multidetector CT imaging of the abdomen and pelvis was performed using the standard protocol following bolus administration of intravenous contrast. CONTRAST:  165mL ISOVUE-300 IOPAMIDOL (ISOVUE-300) INJECTION 61% COMPARISON:  CTA 07/05/2016 FINDINGS: Lower chest: Mild motion artifact. No pleural fluid. No consolidation. Postsurgical change of the aortic valve, partially included. Hepatobiliary: No hepatic injury or perihepatic hematoma. Gallbladder is unremarkable Pancreas: Pancreatic atrophy.  No ductal dilatation or inflammation. Spleen: No splenic injury or perisplenic hematoma. Adrenals/Urinary Tract: No adrenal hemorrhage or renal injury  identified. Bilateral renal cysts. Extrarenal pelvis configuration on the right. A 2.3 cm lesion in the right kidney was previously intermediate attenuation, currently measures simple fluid and is consistent with simple cyst. Questionable mild bladder wall thickening. No evidence of bladder injury. Stomach/Bowel: No evidence of bowel wall thickening or inflammation. No bowel injury. Rather extensive colonic diverticulosis in the sigmoid colon, lesser diverticulosis elsewhere. No diverticular inflammation. Normal appendix. Small bowel and stomach are unremarkable. No mesenteric hematoma. Vascular/Lymphatic: Abdominal aorta and IVC are intact. Moderate aortic atherosclerosis. No retroperitoneal fluid. No adenopathy. Reproductive: TURP defect in the prostate. Other: No free air, free fluid, or intra-abdominal fluid collection. No confluent body wall contusion. Musculoskeletal: No acute fracture of the included lower ribs, bony pelvis, or spine. Chronic compression fracture of L2, without progression. IMPRESSION: 1. No acute abnormality or evidence of traumatic injury to the abdomen or pelvis. 2. Previously questioned indeterminate lesion in the right kidney represents a simple cyst. 3. Aortic atherosclerosis.  No aneurysm. 4. Additional stable chronic and nontraumatic findings as described. Electronically Signed   By: Jeb Levering M.D.   On: 02/09/2017 02:31     Microbiology: No results found for this or any previous visit (from the past 240 hour(s)).   Labs: Basic Metabolic Panel:  Recent Labs Lab 02/09/17 0022  NA 130*  K 3.5  CL 97*  CO2 21*  GLUCOSE 159*  BUN 21*  CREATININE 1.31*  CALCIUM 9.5   Liver Function Tests:  Recent Labs  Lab 02/09/17 0022  AST 25  ALT 22  ALKPHOS 69  BILITOT 0.9  PROT 7.3  ALBUMIN 3.8   No results for input(s): LIPASE, AMYLASE in the last 168 hours. No results for input(s): AMMONIA in the last 168 hours. CBC:  Recent Labs Lab 02/09/17 0022  WBC  8.4  NEUTROABS 6.1  HGB 14.8  HCT 41.1  MCV 86.5  PLT 175   Cardiac Enzymes:  Recent Labs Lab 02/09/17 0022 02/09/17 0531 02/09/17 1107  TROPONINI 0.05* 0.05* 0.04*   BNP: Invalid input(s): POCBNP CBG:  Recent Labs Lab 02/09/17 0821 02/09/17 1120  GLUCAP 164* 215*    Time coordinating discharge:  Greater than 30 minutes  Signed:  Linda Biehn, DO [facility] Pager: [phone] 02/09/2017, 1:22 PM

## 2017-02-09 NOTE — ED Notes (Signed)
CRITICAL VALUE ALERT  Critical value received:  Troponin 0.05  Date of notification:  02/09/17  Time of notification:  0115  Critical value read back:Yes.    Nurse who received alert:  bkn  MD notified (1st page):  [place]  Time of first page:  0116  MD notified (2nd page):  Time of second page:  Responding MD:    Time MD responded:

## 2017-02-09 NOTE — Progress Notes (Signed)
[place] for Warfarin Indication: atrial fibrillation  Allergies  Allergen Reactions  . Ambien [Zolpidem Tartrate] Other (See Comments)    Sleep walks  . Lipitor [Atorvastatin Calcium] Other (See Comments)    myalgias  . Ranitidine Other (See Comments)    Chest discomfort  . Simvastatin Other (See Comments)    Myalgias  . Xanax Xr [Alprazolam Er] Other (See Comments)    Tightness in chest  . Cholestatin Other (See Comments)    UNSPECIFIED REACTION   . Clopidogrel Bisulfate Rash   Patient Measurements: Height: 6\' 1"  (185.4 cm) Weight: 214 lb (97.1 kg) IBW/kg (Calculated) : 79.9  Vital Signs: Temp: 97.9 F (36.6 C) (03/29 0515) Temp Source: Oral (03/29 0515) BP: 145/68 (03/29 0515) Pulse Rate: 60 (03/29 0515)  Labs:  Recent Labs  02/09/17 0022 02/09/17 0531  HGB 14.8  --   HCT 41.1  --   PLT 175  --   LABPROT 19.6*  --   INR 1.64  --   CREATININE 1.31*  --   TROPONINI 0.05* 0.05*   Estimated Creatinine Clearance: 53.4 mL/min (A) (by C-G formula based on SCr of 1.31 mg/dL (H)).  Medical History: Past Medical History:  Diagnosis Date  . Aortic stenosis    a. mod-sev by echo 05/2016.  . Asthma   . Cerebrovascular disease 07/2010   TIA; carotid ultrasound in 07/2010-significant bilateral plaque without focal internal carotid artery stenosis; MRI -encephalomalacia left temporal and right temporal lobes; small inferior right cerebellar infarct; small vessel disease  . Chronic atrial fibrillation (HCC)    Paroxysmal; Echocardiogram in 2007-normal EF; mild LVH; left atrial enlargement; mild stenosis and minimal AI; negative stress nuclear study in 2008  . Chronic systolic CHF (congestive heart failure) ([place])    a. dx 05/2016 - EF 35-40%, diffuse HK, mod-severe AS, mod gradient, severe AVA VTI likely due to decreased cardiac output in setting of systolic dysfsunction and significant mitral regurgitation, mild MR, mod-severe MR, severe  LAE, mild-mod RV dilation, mild RAE, mild-mod TR, mod PASP 87mmHg.  . CKD (chronic kidney disease), stage II   . Degenerative joint disease    feet and legs  . Diabetes mellitus    no insulin; A1c of 6.6 in 2005  . Dizziness    occurs daily,especially in am  . Exertional dyspnea   . Gastroesophageal reflux disease   . Hepatic steatosis   . History of noncompliance with medical treatment   . Hyperlipidemia    adverse reactions to statins and niacin  . Hypertension    Borderline  . Irregular heartbeat   . Mitral regurgitation    a. mod-sev by echo 05/2016  . Peripheral vascular disease ([place])   . Renal insufficiency   . S/P TAVR (transcatheter aortic valve replacement) 07/19/2016   29 mm Edwards Sapien 3 transcatheter heart valve placed via left percutaneous transfemoral approach  . Temporal arteritis ([place])   . Tricuspid regurgitation    a. mild-mod TR by echo 05/2016   Medications:  Prescriptions Prior to Admission  Medication Sig Dispense Refill Last Dose  . bisacodyl (DULCOLAX) 5 MG EC tablet Take 5 mg by mouth daily as needed for moderate constipation.   Taking  . bisoprolol (ZEBETA) 5 MG tablet TAKE (1/2) TABLET BY MOUTH DAILY. 15 tablet 5 Taking  . docusate sodium (COLACE) 100 MG capsule Take 100 mg by mouth 2 (two) times daily.   Taking  . furosemide (LASIX) 20 MG tablet Take 1 tablet (20  mg total) by mouth daily. 30 tablet 6 Taking  . losartan (COZAAR) 25 MG tablet TAKE (1/2) TABLET BY MOUTH DAILY. 15 tablet 5 Taking  . metFORMIN (GLUCOPHAGE) 500 MG tablet One half tablet bid 30 tablet 3 Taking  . nortriptyline (PAMELOR) 10 MG capsule TAKE 1 TO 2 CAPSULES AT BEDTIME FOR BURNING IN FEET. 60 capsule 5 Taking  . rosuvastatin (CRESTOR) 5 MG tablet TAKE (1) TABLET BY MOUTH ONCE DAILY. 30 tablet 5 Taking  . Sodium Phosphates (FLEET ENEMA RE) Place 1 Dose rectally daily as needed (constipation). Via rectum prn daily for constipation    Taking  . spironolactone (ALDACTONE) 25 MG  tablet TAKE (1) TABLET DAILY IN THE MORNING. 30 tablet 5 Taking  . traZODone (DESYREL) 100 MG tablet TAKE 1 TABLET BY MOUTH AT BEDTIME. 30 tablet 5 Taking  . warfarin (COUMADIN) 6 MG tablet TAKE 1 TABLET AS DIRECTED. 30 tablet 5 Taking  Home Warfarin 6mg  M-W-F-Sun, 3mg  T-TH-Sat  Assessment: Okay for Protocol, INR below goal.  No bleeding noted. Goal of Therapy:  INR 2-3   Plan:  Warfarin 6mg  PO x 1 today Daily PT / INR Monitor for signs and symptoms of bleeding.   [name] 02/09/2017,8:42 AM

## 2017-02-09 NOTE — Progress Notes (Signed)
*  PRELIMINARY RESULTS* Echocardiogram 2D Echocardiogram has been performed.  [name] [name] 02/09/2017, 4:31 PM

## 2017-02-09 NOTE — Progress Notes (Signed)
Patient discharged home.  IV removed - WNL.  Reviewed DC instructions and medications with patient and niece.  Educated on HF management at home - verbalized understanding.  No questions at this time. Assisted off unit in NAD by NT

## 2017-02-09 NOTE — Progress Notes (Addendum)
PROGRESS NOTE  [name] [name] [id number] DOB: 10/05/1932 DOA: 02/08/2017 PCP: Sallee Lange, MD  Brief History:  83 year old male with history of aortic stenosis, chronic A. fib ablation, hypertension, systolic CHF, diabetes mellitus type 2 presents with one-day history of shortness of breath and bilateral arm pain. The patient is a poor historian. Much of the history is obtained from review of the medical record. The patient states that he had a mechanical fall on 02/06/2017 walking back to his house after mowing the lawn. He states that he hit his right thorax and abdomen. He denies hitting his head or syncope.  Regarding his cardiac history, the patient presented to the hospital on 02/27/2016 in cardiogenic shock secondary to severe aortic stenosis. He was placed on milrinone and intravenous furosemide. During that hospitalization, the patient underwent a right carotid stent on 07/11/2016 and subsequently underwent TAVR on 07/19/2016. His most recent follow-up with Dr. Haroldine Laws showed his diary to be 218 pounds. He was continued on his current cardiac regimen of bisoprolol, spironolactone, and furosemide.  Assessment/Plan: Acute on chronic systolic CHF -The patient appears euvolemic this morning (02/09/2017) -He has no peripheral edema or JVD on examination -Change IV lasix to po -his initial dyspnea may have been partly attributable to metabolic acidosis -His previous dry weight was 218 pounds -02/09/2017 weight 214 -09/05/2016 echo EF 35-40 percent, PA SP 34, mild to moderate MR, mild TR -Continue bisoprolol, spironolactone,  losartan  New left bundle branch block -consult cardiology -personally reviewed EKG--afib, LBBB  Chronic atrial fibrillation -Rate controlled -Continue warfarin -Continue bisoprolol  Elevated troponin -due to CHF/demand ischemia  Severe aortic stenosis -Status post TAVR 07/19/2016  Diabetes mellitus type 2 -Repeat hemoglobin  A1c -09/23/2016 hemoglobin A1c 7.1 -Given his CKD stage III, I would hesitate to restart the patient's metformin in the setting of his metabolic acidosis -Concerned that his metformin may have partly contributed to his metabolic acidosis -Continue NovoLog sliding scale for now  Hyperlipidemia -Continue statin  Carotid stenosis -Status post right carotid stent 07/11/2016    Disposition Plan:   Home when cleared by cardiology Family Communication:   No Family at bedside--Total time spent 35 minutes.  Greater than 50% spent face to face counseling and coordinating care.  [phone] to [phone]   Consultants:  cardiology  Code Status:  FULL   DVT Prophylaxis:  Coumadin  Procedures: As Listed in Progress Note Above  Antibiotics: None    Subjective: Patient denies fevers, chills, headache, chest pain, dyspnea, nausea, vomiting, diarrhea, abdominal pain, dysuria, hematuria, hematochezia, and melena.   Objective: Vitals:   02/09/17 0400 02/09/17 0430 02/09/17 0515 02/09/17 0600  BP: (!) 142/106 (!) 142/92 (!) 145/68   Pulse: 72 75 60   Resp: 17 13 16    Temp:   97.9 F (36.6 C)   TempSrc:   Oral   SpO2: 100% 94% 98%   Weight:    97.1 kg (214 lb)  Height:    6\' 1"  (1.854 m)    Intake/Output Summary (Last 24 hours) at 02/09/17 0940 Last data filed at 02/09/17 0755  Gross per 24 hour  Intake                0 ml  Output              300 ml  Net             -300 ml   Weight change:  Exam:  General:  Pt is alert, follows commands appropriately, not in acute distress  HEENT: No icterus, No thrush, No neck mass, [place]/AT  Cardiovascular: RRR, S1/S2, no rubs, no gallops, no JVD  Respiratory: CTA bilaterally, no wheezing, no crackles, no rhonchi  Abdomen: Soft/+BS, non tender, non distended, no guarding  Extremities: No edema, No lymphangitis, No petechiae, No rashes, no synovitis   Data Reviewed: I have personally reviewed following labs and imaging studies Basic  Metabolic Panel:  Recent Labs Lab 02/09/17 0022  NA 130*  K 3.5  CL 97*  CO2 21*  GLUCOSE 159*  BUN 21*  CREATININE 1.31*  CALCIUM 9.5   Liver Function Tests:  Recent Labs Lab 02/09/17 0022  AST 25  ALT 22  ALKPHOS 69  BILITOT 0.9  PROT 7.3  ALBUMIN 3.8   No results for input(s): LIPASE, AMYLASE in the last 168 hours. No results for input(s): AMMONIA in the last 168 hours. Coagulation Profile:  Recent Labs Lab 02/09/17 0022  INR 1.64   CBC:  Recent Labs Lab 02/09/17 0022  WBC 8.4  NEUTROABS 6.1  HGB 14.8  HCT 41.1  MCV 86.5  PLT 175   Cardiac Enzymes:  Recent Labs Lab 02/09/17 0022 02/09/17 0531  TROPONINI 0.05* 0.05*   BNP: Invalid input(s): POCBNP CBG:  Recent Labs Lab 02/09/17 0821  GLUCAP 164*   HbA1C: No results for input(s): HGBA1C in the last 72 hours. Urine analysis:    Component Value Date/Time   COLORURINE RED (A) 07/11/2016 0225   APPEARANCEUR TURBID (A) 07/11/2016 0225   LABSPEC 1.018 07/11/2016 0225   PHURINE 5.0 07/11/2016 0225   GLUCOSEU NEGATIVE 07/11/2016 0225   HGBUR LARGE (A) 07/11/2016 0225   BILIRUBINUR LARGE (A) 07/11/2016 0225   BILIRUBINUR + 06/23/2014 1707   KETONESUR 15 (A) 07/11/2016 0225   PROTEINUR >300 (A) 07/11/2016 0225   UROBILINOGEN 0.2 08/20/2015 2030   NITRITE POSITIVE (A) 07/11/2016 0225   LEUKOCYTESUR MODERATE (A) 07/11/2016 0225   Sepsis Labs: @LABRCNTIP (procalcitonin:4,lacticidven:4) )No results found for this or any previous visit (from the past 240 hour(s)).   Scheduled Meds: . aspirin  81 mg Oral Daily  . bisoprolol  5 mg Oral Daily  . docusate sodium  100 mg Oral BID  . furosemide  40 mg Intravenous BID  . insulin aspart  0-5 Units Subcutaneous QHS  . insulin aspart  0-9 Units Subcutaneous TID WC  . losartan  25 mg Oral Daily  . pantoprazole  40 mg Oral Q0600  . rosuvastatin  5 mg Oral Daily  . sodium chloride flush  3 mL Intravenous Q12H  . traZODone  50 mg Oral QHS  .  warfarin  6 mg Oral Once  . [START ON 02/10/2017] Warfarin - Pharmacist Dosing Inpatient   Does not apply Q24H   Continuous Infusions:  Procedures/Studies: Dg Ribs Unilateral W/chest Right  Result Date: 02/09/2017 CLINICAL DATA:  Status post fall onto right rib cage, with anterior lower rib pain. Shortness of breath. Initial encounter. EXAM: RIGHT RIBS AND CHEST - 3+ VIEW COMPARISON:  Chest radiograph performed 12/26/2016 FINDINGS: No displaced rib fractures are seen. The lungs are well-aerated. Minimal bibasilar atelectasis is noted. Peribronchial thickening is noted. There is no evidence of pleural effusion or pneumothorax. The cardiomediastinal silhouette is within normal limits. An aortic valve replacement is noted. No acute osseous abnormalities are seen. IMPRESSION: 1. No displaced rib fracture seen. 2. Minimal bibasilar atelectasis.  Peribronchial thickening noted. Electronically Signed   By: Garald Balding  M.D.   On: 02/09/2017 01:29   Ct Head Wo Contrast  Result Date: 02/09/2017 CLINICAL DATA:  83 y/o  M; status post fall. EXAM: CT HEAD WITHOUT CONTRAST TECHNIQUE: Contiguous axial images were obtained from the base of the skull through the vertex without intravenous contrast. COMPARISON:  07/02/2016 CT of the head. FINDINGS: Brain: No evidence of acute infarction, hemorrhage, hydrocephalus, extra-axial collection or mass lesion/mass effect. Stable small chronic infarcts within the right anterior temporal lobe, left lateral temporal lobe, and bilateral cerebellar hemispheres. Stable bilateral cerebellar hemisphere dystrophic calcification. Stable small lucencies in bilateral thalamus probably representing chronic lacunar infarcts. Stable mild diffuse brain parenchymal volume loss. Vascular: Moderate calcific atherosclerosis of cavernous and paraclinoid internal carotid arteries. Skull: Normal. Negative for fracture or focal lesion. Sinuses/Orbits: No acute finding. Other: Bilateral intra-ocular  lens replacement. IMPRESSION: 1. No acute intracranial abnormality identified. No displaced calvarial fracture. 2. Stable chronic infarcts in bilateral temporal lobes, cerebellar hemispheres, and basal ganglia. Electronically Signed   By: Kristine Garbe M.D.   On: 02/09/2017 02:27   Ct Abdomen Pelvis W Contrast  Result Date: 02/09/2017 CLINICAL DATA:  Fall 2 days ago on anticoagulation.  Arm pain. EXAM: CT ABDOMEN AND PELVIS WITH CONTRAST TECHNIQUE: Multidetector CT imaging of the abdomen and pelvis was performed using the standard protocol following bolus administration of intravenous contrast. CONTRAST:  142mL ISOVUE-300 IOPAMIDOL (ISOVUE-300) INJECTION 61% COMPARISON:  CTA 07/05/2016 FINDINGS: Lower chest: Mild motion artifact. No pleural fluid. No consolidation. Postsurgical change of the aortic valve, partially included. Hepatobiliary: No hepatic injury or perihepatic hematoma. Gallbladder is unremarkable Pancreas: Pancreatic atrophy.  No ductal dilatation or inflammation. Spleen: No splenic injury or perisplenic hematoma. Adrenals/Urinary Tract: No adrenal hemorrhage or renal injury identified. Bilateral renal cysts. Extrarenal pelvis configuration on the right. A 2.3 cm lesion in the right kidney was previously intermediate attenuation, currently measures simple fluid and is consistent with simple cyst. Questionable mild bladder wall thickening. No evidence of bladder injury. Stomach/Bowel: No evidence of bowel wall thickening or inflammation. No bowel injury. Rather extensive colonic diverticulosis in the sigmoid colon, lesser diverticulosis elsewhere. No diverticular inflammation. Normal appendix. Small bowel and stomach are unremarkable. No mesenteric hematoma. Vascular/Lymphatic: Abdominal aorta and IVC are intact. Moderate aortic atherosclerosis. No retroperitoneal fluid. No adenopathy. Reproductive: TURP defect in the prostate. Other: No free air, free fluid, or intra-abdominal fluid  collection. No confluent body wall contusion. Musculoskeletal: No acute fracture of the included lower ribs, bony pelvis, or spine. Chronic compression fracture of L2, without progression. IMPRESSION: 1. No acute abnormality or evidence of traumatic injury to the abdomen or pelvis. 2. Previously questioned indeterminate lesion in the right kidney represents a simple cyst. 3. Aortic atherosclerosis.  No aneurysm. 4. Additional stable chronic and nontraumatic findings as described. Electronically Signed   By: Jeb Levering M.D.   On: 02/09/2017 02:31    Alois Colgan, DO  [facility] Pager [phone]  If 7PM-7AM, please contact night-coverage [email or web address] Password [email or web address] 02/09/2017, 9:40 AM   LOS: 0 days

## 2017-02-09 NOTE — ED Notes (Signed)
Went in to ambulate patient, sat him on side of bed to so he could get his bearings. Patient stated that he felt a little dizzy. Waited a few minutes before standing patient. Patient attempted to make a few steps and was very unsteady in his gait. Patient seemed to stagger and look as if he was going to fall head first. Put patient back in bed and made Dr Wyvonnia Dusky aware of patients status. Patient seemed very winded after this.

## 2017-02-09 NOTE — Care Management Obs Status (Signed)
[facility] NOTIFICATION   Patient Details  Name: [name] [name] MRN: [id number] Date of Birth: 11/02/1932   Medicare Observation Status Notification Given:  Yes    Sherald Barge, RN 02/09/2017, 9:26 AM

## 2017-02-09 NOTE — ED Provider Notes (Signed)
[facility] DEPT Provider Note   CSN: [id number] Arrival date & time: 02/08/17  2345  By signing my name below, I, Margit Banda, attest that this documentation has been prepared under the direction and in the presence of Ezequiel Essex, MD. Electronically Signed: Margit Banda, ED Scribe. 02/09/17. 12:22 AM.   History   Chief Complaint Chief Complaint  Patient presents with  . Fall    arm pain    HPI VIRGILIO BROADHEAD is a 83 y.o. male BIB EMS who presents to the Emergency Department complaining of sudden left arm pain that started ~ 2:30/3 pm (02/08/17). Pt reports that pain radiated to his right arm before pain resolved PTA. Prior to onset, pt notes that he fell, Monday (02/06/17) and hurt his arms and abdomen. No head injury at that time. Pt was able to ambulate after fall. Per pt's wife, pt's breathing seems normal. He currently is on coumadin. No heart bypass. H/o of stent in his neck. Pt does not use oxygen at home. He takes all medications as prescribed. Pt denies nausea, vomiting, diarrhea and HA.   The history is provided by the patient. No language interpreter was used.    Past Medical History:  Diagnosis Date  . Aortic stenosis    a. mod-sev by echo 05/2016.  . Asthma   . Cerebrovascular disease 07/2010   TIA; carotid ultrasound in 07/2010-significant bilateral plaque without focal internal carotid artery stenosis; MRI -encephalomalacia left temporal and right temporal lobes; small inferior right cerebellar infarct; small vessel disease  . Chronic atrial fibrillation (HCC)    Paroxysmal; Echocardiogram in 2007-normal EF; mild LVH; left atrial enlargement; mild stenosis and minimal AI; negative stress nuclear study in 2008  . Chronic systolic CHF (congestive heart failure) ([place])    a. dx 05/2016 - EF 35-40%, diffuse HK, mod-severe AS, mod gradient, severe AVA VTI likely due to decreased cardiac output in setting of systolic dysfsunction and significant mitral regurgitation,  mild MR, mod-severe MR, severe LAE, mild-mod RV dilation, mild RAE, mild-mod TR, mod PASP 9mmHg.  . CKD (chronic kidney disease), stage II   . Degenerative joint disease    feet and legs  . Diabetes mellitus    no insulin; A1c of 6.6 in 2005  . Dizziness    occurs daily,especially in am  . Exertional dyspnea   . Gastroesophageal reflux disease   . Hepatic steatosis   . History of noncompliance with medical treatment   . Hyperlipidemia    adverse reactions to statins and niacin  . Hypertension    Borderline  . Irregular heartbeat   . Mitral regurgitation    a. mod-sev by echo 05/2016  . Peripheral vascular disease ([place])   . Renal insufficiency   . S/P TAVR (transcatheter aortic valve replacement) 07/19/2016   29 mm Edwards Sapien 3 transcatheter heart valve placed via left percutaneous transfemoral approach  . Temporal arteritis ([place])   . Tricuspid regurgitation    a. mild-mod TR by echo 05/2016    Patient Active Problem List   Diagnosis Date Noted  . S/P TAVR (transcatheter aortic valve replacement) 07/19/2016  . Mitral regurgitation   . VT (ventricular tachycardia) ([place])   . Cardiogenic shock ([place])   . Acute respiratory failure with hypoxia ([place])   . Dyspnea 06/28/2016  . CAD (coronary artery disease), native coronary artery 06/28/2016  . Acute on chronic combined systolic and diastolic CHF, NYHA class 4 ([place])   . Anemia of chronic disease 04/12/2016  . Orthostatic hypotension  08/20/2015  . AKI (acute kidney injury) ([place]) 08/20/2015  . Asthma exacerbation 08/20/2015  . Cognitive dysfunction 05/29/2015  . Chronic back pain 07/29/2014  . Lumbar pain 06/23/2014  . Chronic pain of right ankle 05/30/2014  . Diabetic neuropathy ([place]) 05/30/2014  . Chest congestion 05/02/2013  . Wheezing 05/02/2013  . Unspecified constipation 04/30/2013  . Leg edema, left 04/27/2013  . Carotid stenosis 04/25/2012  . BPH (benign prostatic hypertrophy) with urinary obstruction 09/27/2011  .  Dyspnea on exertion 08/01/2011  . Diabetes mellitus ([place]) 07/14/2011  . Hypertension   . Atrial fibrillation ([place])   . Hyperlipidemia   . History of noncompliance with medical treatment   . Aortic stenosis   . Cerebrovascular disease 07/15/2010    Past Surgical History:  Procedure Laterality Date  . COLONOSCOPY  2002  . COLONOSCOPY  01/19/2012   Procedure: COLONOSCOPY;  Surgeon: Rogene Houston, MD;  [facility];  Service: Endoscopy;  Laterality: N/A;  1030  . LIPOMA EXCISION  1980  . ORIF ANKLE FRACTURE  2000   Right  . PERIPHERAL VASCULAR CATHETERIZATION N/A 07/11/2016   Procedure: Carotid PTA/Stent Intervention;  Surgeon: Lorretta Harp, MD;  Location: [facility];  Service: Cardiovascular;  Laterality: N/A;  . PROSTATE SURGERY  12/2011  . ROTATOR CUFF REPAIR     Right  . TEE WITHOUT CARDIOVERSION N/A 06/17/2016   Procedure: TRANSESOPHAGEAL ECHOCARDIOGRAM (TEE);  Surgeon: Jerline Pain, MD;  Location: [facility];  Service: Cardiovascular;  Laterality: N/A;  . TEE WITHOUT CARDIOVERSION N/A 07/19/2016   Procedure: TRANSESOPHAGEAL ECHOCARDIOGRAM (TEE);  Surgeon: Sherren Mocha, MD;  Location: [facility];  Service: Open Heart Surgery;  Laterality: N/A;  . TRANSCATHETER AORTIC VALVE REPLACEMENT, TRANSFEMORAL N/A 07/19/2016   Procedure: TRANSCATHETER AORTIC VALVE REPLACEMENT, TRANSFEMORAL;  Surgeon: Sherren Mocha, MD;  Location: [facility];  Service: Open Heart Surgery;  Laterality: N/A;  . TRANSURETHRAL RESECTION OF PROSTATE  09/2011  . URETHRAL STRICTURE DILATATION  1980s       Home Medications    Prior to Admission medications   Medication Sig Start Date End Date Taking? Authorizing Provider  bisacodyl (DULCOLAX) 5 MG EC tablet Take 5 mg by mouth daily as needed for moderate constipation.    Historical Provider, MD  bisoprolol (ZEBETA) 5 MG tablet TAKE (1/2) TABLET BY MOUTH DAILY. 12/16/16   Kathyrn Drown, MD  docusate sodium (COLACE) 100 MG capsule Take 100 mg by mouth 2  (two) times daily.    Historical Provider, MD  furosemide (LASIX) 20 MG tablet Take 1 tablet (20 mg total) by mouth daily. 09/23/16   Amy D Clegg, NP  losartan (COZAAR) 25 MG tablet TAKE (1/2) TABLET BY MOUTH DAILY. 12/16/16   Kathyrn Drown, MD  metFORMIN (GLUCOPHAGE) 500 MG tablet One half tablet bid 09/16/16   Kathyrn Drown, MD  nortriptyline (PAMELOR) 10 MG capsule TAKE 1 TO 2 CAPSULES AT BEDTIME FOR BURNING IN FEET. 12/16/16   Kathyrn Drown, MD  rosuvastatin (CRESTOR) 5 MG tablet TAKE (1) TABLET BY MOUTH ONCE DAILY. 12/16/16   Kathyrn Drown, MD  Sodium Phosphates (FLEET ENEMA RE) Place 1 Dose rectally daily as needed (constipation). Via rectum prn daily for constipation     Historical Provider, MD  spironolactone (ALDACTONE) 25 MG tablet TAKE (1) TABLET DAILY IN THE MORNING. 12/16/16   Kathyrn Drown, MD  traZODone (DESYREL) 100 MG tablet TAKE 1 TABLET BY MOUTH AT BEDTIME. 12/16/16   Kathyrn Drown, MD  warfarin (COUMADIN) 6 MG tablet TAKE 1 TABLET AS DIRECTED. 12/16/16   Kathyrn Drown, MD    Family History Family History  Problem Relation Age of Onset  . Stroke Mother   . Diabetes Father   . Colon cancer Neg Hx     Social History Social History  Substance Use Topics  . Smoking status: Former Smoker    Packs/day: 1.00    Years: 20.00    Types: Cigarettes    Start date: 07/04/1950    Quit date: 04/25/1992  . Smokeless tobacco: Current User    Types: Chew  . Alcohol use No     Allergies   Ambien [zolpidem tartrate]; Lipitor [atorvastatin calcium]; Ranitidine; Simvastatin; Xanax xr [alprazolam er]; Cholestatin; and Clopidogrel bisulfate   Review of Systems Review of Systems   Physical Exam Updated Vital Signs BP (!) 112/43 (BP Location: Left Arm)   Pulse 82   Temp 98.6 F (37 C) (Oral)   Resp 20   Ht 6\' 1"  (1.854 m)   Wt 219 lb (99.3 kg)   SpO2 97%   BMI 28.89 kg/m   Physical Exam  Constitutional: He is oriented to person, place, and time. He appears well-developed and  well-nourished. No distress.  Dyspneic with conversation.   HENT:  Head: Normocephalic and atraumatic.  Mouth/Throat: Oropharynx is clear and moist. No oropharyngeal exudate.  Eyes: Conjunctivae and EOM are normal. Pupils are equal, round, and reactive to light.  Neck: Normal range of motion. Neck supple.  No meningismus.  Cardiovascular: Normal rate, normal heart sounds and intact distal pulses.   No murmur heard. Irregular rhythm  Pulmonary/Chest: Effort normal and breath sounds normal. No respiratory distress.  Abdominal: Soft. There is no tenderness. There is no rebound and no guarding.  Right lower rib tenderness. No crepitance or ecchymosis. Mild right upper abdomin tenderness. FROM bilateral arms. No bony tenderness.  Musculoskeletal: Normal range of motion. He exhibits no edema or tenderness.  Intact radial pulses.  Neurological: He is alert and oriented to person, place, and time. No cranial nerve deficit. He exhibits normal muscle tone. Coordination normal.   5/5 strength throughout. CN 2-12 intact.Equal grip strength.   Skin: Skin is warm.  Psychiatric: He has a normal mood and affect. His behavior is normal.  Nursing note and vitals reviewed.    ED Treatments / Results  DIAGNOSTIC STUDIES: Oxygen Saturation is 97% on RA, normal by my interpretation.   COORDINATION OF CARE: 12:18 AM-Discussed next steps with pt. Pt verbalized understanding and is agreeable with the plan.    Labs (all labs ordered are listed, but only abnormal results are displayed) Labs Reviewed  COMPREHENSIVE METABOLIC PANEL - Abnormal; Notable for the following:       Result Value   Sodium 130 (*)    Chloride 97 (*)    CO2 21 (*)    Glucose, Bld 159 (*)    BUN 21 (*)    Creatinine, Ser 1.31 (*)    GFR calc non Af Amer 49 (*)    GFR calc Af Amer 57 (*)    All other components within normal limits  TROPONIN I - Abnormal; Notable for the following:    Troponin I 0.05 (*)    All other  components within normal limits  PROTIME-INR - Abnormal; Notable for the following:    Prothrombin Time 19.6 (*)    All other components within normal limits  TROPONIN I - Abnormal; Notable for the following:  Troponin I 0.05 (*)    All other components within normal limits  CBC WITH DIFFERENTIAL/PLATELET  BRAIN NATRIURETIC PEPTIDE  TSH  TROPONIN I  TROPONIN I    EKG  EKG Interpretation None       Radiology Dg Ribs Unilateral W/chest Right  Result Date: 02/09/2017 CLINICAL DATA:  Status post fall onto right rib cage, with anterior lower rib pain. Shortness of breath. Initial encounter. EXAM: RIGHT RIBS AND CHEST - 3+ VIEW COMPARISON:  Chest radiograph performed 12/26/2016 FINDINGS: No displaced rib fractures are seen. The lungs are well-aerated. Minimal bibasilar atelectasis is noted. Peribronchial thickening is noted. There is no evidence of pleural effusion or pneumothorax. The cardiomediastinal silhouette is within normal limits. An aortic valve replacement is noted. No acute osseous abnormalities are seen. IMPRESSION: 1. No displaced rib fracture seen. 2. Minimal bibasilar atelectasis.  Peribronchial thickening noted. Electronically Signed   By: Garald Balding M.D.   On: 02/09/2017 01:29   Ct Head Wo Contrast  Result Date: 02/09/2017 CLINICAL DATA:  84 y/o  M; status post fall. EXAM: CT HEAD WITHOUT CONTRAST TECHNIQUE: Contiguous axial images were obtained from the base of the skull through the vertex without intravenous contrast. COMPARISON:  07/02/2016 CT of the head. FINDINGS: Brain: No evidence of acute infarction, hemorrhage, hydrocephalus, extra-axial collection or mass lesion/mass effect. Stable small chronic infarcts within the right anterior temporal lobe, left lateral temporal lobe, and bilateral cerebellar hemispheres. Stable bilateral cerebellar hemisphere dystrophic calcification. Stable small lucencies in bilateral thalamus probably representing chronic lacunar  infarcts. Stable mild diffuse brain parenchymal volume loss. Vascular: Moderate calcific atherosclerosis of cavernous and paraclinoid internal carotid arteries. Skull: Normal. Negative for fracture or focal lesion. Sinuses/Orbits: No acute finding. Other: Bilateral intra-ocular lens replacement. IMPRESSION: 1. No acute intracranial abnormality identified. No displaced calvarial fracture. 2. Stable chronic infarcts in bilateral temporal lobes, cerebellar hemispheres, and basal ganglia. Electronically Signed   By: Kristine Garbe M.D.   On: 02/09/2017 02:27   Ct Abdomen Pelvis W Contrast  Result Date: 02/09/2017 CLINICAL DATA:  Fall 2 days ago on anticoagulation.  Arm pain. EXAM: CT ABDOMEN AND PELVIS WITH CONTRAST TECHNIQUE: Multidetector CT imaging of the abdomen and pelvis was performed using the standard protocol following bolus administration of intravenous contrast. CONTRAST:  124mL ISOVUE-300 IOPAMIDOL (ISOVUE-300) INJECTION 61% COMPARISON:  CTA 07/05/2016 FINDINGS: Lower chest: Mild motion artifact. No pleural fluid. No consolidation. Postsurgical change of the aortic valve, partially included. Hepatobiliary: No hepatic injury or perihepatic hematoma. Gallbladder is unremarkable Pancreas: Pancreatic atrophy.  No ductal dilatation or inflammation. Spleen: No splenic injury or perisplenic hematoma. Adrenals/Urinary Tract: No adrenal hemorrhage or renal injury identified. Bilateral renal cysts. Extrarenal pelvis configuration on the right. A 2.3 cm lesion in the right kidney was previously intermediate attenuation, currently measures simple fluid and is consistent with simple cyst. Questionable mild bladder wall thickening. No evidence of bladder injury. Stomach/Bowel: No evidence of bowel wall thickening or inflammation. No bowel injury. Rather extensive colonic diverticulosis in the sigmoid colon, lesser diverticulosis elsewhere. No diverticular inflammation. Normal appendix. Small bowel and  stomach are unremarkable. No mesenteric hematoma. Vascular/Lymphatic: Abdominal aorta and IVC are intact. Moderate aortic atherosclerosis. No retroperitoneal fluid. No adenopathy. Reproductive: TURP defect in the prostate. Other: No free air, free fluid, or intra-abdominal fluid collection. No confluent body wall contusion. Musculoskeletal: No acute fracture of the included lower ribs, bony pelvis, or spine. Chronic compression fracture of L2, without progression. IMPRESSION: 1. No acute abnormality or evidence of traumatic injury  to the abdomen or pelvis. 2. Previously questioned indeterminate lesion in the right kidney represents a simple cyst. 3. Aortic atherosclerosis.  No aneurysm. 4. Additional stable chronic and nontraumatic findings as described. Electronically Signed   By: Jeb Levering M.D.   On: 02/09/2017 02:31    Procedures Procedures (including critical care time)  Medications Ordered in ED Medications - No data to display   Initial Impression / Assessment and Plan / ED Course  I have reviewed the triage vital signs and the nursing notes.  Pertinent labs & imaging results that were available during my care of the patient were reviewed by me and considered in my medical decision making (see chart for details).     Patient had episode of right arm pain for several hours then this resolved and became left arm pain for several hours which is now resolved. Denies chest pain. Had a fall 3 days ago sustaining injury to right lower ribs but denies hitting head.  EKG is atrial fibrillation with new left bundle-branch block.  Troponin minimally elevated at 0.05. Patient denies any chest pain. Diagnostic cardiac catheterization revealed moderate coronary artery disease with 75% stenosis of the mid left anterior descending coronary artery, 80% stenosis of the distal left circumflex coronary artery, and otherwise moderate nonobstructive disease.  Chest x-rays negative. Patient given IV  Lasix as he does appear to be quite dyspneic with conversation. Traumatic imaging is negative of his head and abdomen which was obtained due to his fall on anticoagulation.  Unclear whether troponin elevation is due to patient's CHF or may represent coronary event. He denies chest pain or shortness of breath currently. However he does have new left bundle branch block with elevated troponin.  Observation admission d/w Dr. Marin Comment.  Final Clinical Impressions(s) / ED Diagnoses   Final diagnoses:  Elevated troponin  Pain in both upper extremities    New Prescriptions New Prescriptions   No medications on file    I personally performed the services described in this documentation, which was scribed in my presence. The recorded information has been reviewed and is accurate.     Ezequiel Essex, MD 02/09/17 [phone]

## 2017-02-09 NOTE — Care Management Note (Signed)
Case Management Note  Patient Details  Name: [name] [name] MRN: [id number] Date of Birth: 1932/08/23  Subjective/Objective:                  Pt admitted from home, lives with wife and has 24/7 aid for both him and his wife. He has CHF. He uses a walker for mobility, has a WC if needed. He has no HH services pta. He plans to return home at DC.   Action/Plan: PT eval pending. CM will cont to follow.   Expected Discharge Date:      02/10/2017           Expected Discharge Plan:  Home/Self Care  In-House Referral:  NA  Discharge planning Services  CM Consult  Status of Service:  In process, will continue to follow  Sherald Barge, RN 02/09/2017, 9:29 AM

## 2017-02-09 NOTE — Consult Note (Signed)
Cardiology Consultation   Patient ID: [name] [name]; [id number]; 1932-09-17   Admit date: 02/08/2017 Date of Consult: 02/09/2017  Referring MD:  Dr. Carles Collet Cardiologist:Dr. Bensimhon Consulting Cardiologist:Dr. Harl Bowie  Patient Care Team: Kathyrn Drown, MD as PCP - General (Family Medicine) Yehuda Savannah, MD (Cardiology) [facility], MD as Attending Physician Irine Seal, MD as Attending Physician (Urology)    Reason for Consultation: chf   History of Present Illness:  [name] [name] is a 83 y.o. male with a hx of  severe aortic stenosis, chronic atrial fibrillation, hypertension, systolic HF, type II diabetes mellitus, hyperlipidemia, and cerebrovascular disease who underwent recent R CEA and TAVR (07/19/2016). He initially had presented with severe resp distress, cardiogenic shock and rapid Afib He had a long hospitalization where he was treated for low-output heart failure with IV milrinone, amiodarone and lasix. and treated for symptomatic carotid disease with carotid stenting. He ultimately was treated with TAVR Cath moderate CAD with 75% LAD, 80% distal Cfx, otherwise nonobstructive.right heart cath moderate pulm HTN PA pressure 52/23.   Echo 08/2016 EF 35-40%  Last saw Dr. Haroldine Laws 12/2016 and looked visibly short of breath but volume status was stable on exam. Bp too soft to titrate meds. BNP 146, Crt 1.3. CXR no CHF, acute bronchitis or asthma.  Patient fell 3/26 when getting off his tractor head first and caught himself with arms straight out. He has been sore the past 2 days and. Yest had sudden right arm and rib pain then left arm. Troponin 0.05 x 2, Crt 1.31, K 3.5, TSH high 15.3. EKG afib with LBBB felt to be new but looks similar to EKG 09/05/16. Patient is very sleepy b/c he was up all night. Most of history taken from his niece(POA). Overall he's been stable at home without chest pain, palpitations, dyspnea on exertion or edema. He says he always breaths  shallow. Denies and chest tightness or pressure.     Past Medical History:  Diagnosis Date  . Aortic stenosis    a. mod-sev by echo 05/2016.  . Asthma   . Cerebrovascular disease 07/2010   TIA; carotid ultrasound in 07/2010-significant bilateral plaque without focal internal carotid artery stenosis; MRI -encephalomalacia left temporal and right temporal lobes; small inferior right cerebellar infarct; small vessel disease  . Chronic atrial fibrillation (HCC)    Paroxysmal; Echocardiogram in 2007-normal EF; mild LVH; left atrial enlargement; mild stenosis and minimal AI; negative stress nuclear study in 2008  . Chronic systolic CHF (congestive heart failure) ([place])    a. dx 05/2016 - EF 35-40%, diffuse HK, mod-severe AS, mod gradient, severe AVA VTI likely due to decreased cardiac output in setting of systolic dysfsunction and significant mitral regurgitation, mild MR, mod-severe MR, severe LAE, mild-mod RV dilation, mild RAE, mild-mod TR, mod PASP 26mmHg.  . CKD (chronic kidney disease), stage II   . Degenerative joint disease    feet and legs  . Diabetes mellitus    no insulin; A1c of 6.6 in 2005  . Dizziness    occurs daily,especially in am  . Exertional dyspnea   . Gastroesophageal reflux disease   . Hepatic steatosis   . History of noncompliance with medical treatment   . Hyperlipidemia    adverse reactions to statins and niacin  . Hypertension    Borderline  . Irregular heartbeat   . Mitral regurgitation    a. mod-sev by echo 05/2016  . Peripheral vascular disease ([place])   . Renal insufficiency   .  S/P TAVR (transcatheter aortic valve replacement) 07/19/2016   29 mm Edwards Sapien 3 transcatheter heart valve placed via left percutaneous transfemoral approach  . Temporal arteritis ([place])   . Tricuspid regurgitation    a. mild-mod TR by echo 05/2016    Past Surgical History:  Procedure Laterality Date  . COLONOSCOPY  2002  . COLONOSCOPY  01/19/2012   Procedure: COLONOSCOPY;   Surgeon: Rogene Houston, MD;  [facility];  Service: Endoscopy;  Laterality: N/A;  1030  . LIPOMA EXCISION  1980  . ORIF ANKLE FRACTURE  2000   Right  . PERIPHERAL VASCULAR CATHETERIZATION N/A 07/11/2016   Procedure: Carotid PTA/Stent Intervention;  Surgeon: Lorretta Harp, MD;  Location: [facility];  Service: Cardiovascular;  Laterality: N/A;  . PROSTATE SURGERY  12/2011  . ROTATOR CUFF REPAIR     Right  . TEE WITHOUT CARDIOVERSION N/A 06/17/2016   Procedure: TRANSESOPHAGEAL ECHOCARDIOGRAM (TEE);  Surgeon: Jerline Pain, MD;  Location: [facility];  Service: Cardiovascular;  Laterality: N/A;  . TEE WITHOUT CARDIOVERSION N/A 07/19/2016   Procedure: TRANSESOPHAGEAL ECHOCARDIOGRAM (TEE);  Surgeon: Sherren Mocha, MD;  Location: [facility];  Service: Open Heart Surgery;  Laterality: N/A;  . TRANSCATHETER AORTIC VALVE REPLACEMENT, TRANSFEMORAL N/A 07/19/2016   Procedure: TRANSCATHETER AORTIC VALVE REPLACEMENT, TRANSFEMORAL;  Surgeon: Sherren Mocha, MD;  Location: [facility];  Service: Open Heart Surgery;  Laterality: N/A;  . TRANSURETHRAL RESECTION OF PROSTATE  09/2011  . URETHRAL STRICTURE DILATATION  1980s      Home Meds: Prior to Admission medications   Medication Sig Start Date End Date Taking? Authorizing Provider  bisacodyl (DULCOLAX) 5 MG EC tablet Take 5 mg by mouth daily as needed for moderate constipation.    Historical Provider, MD  bisoprolol (ZEBETA) 5 MG tablet TAKE (1/2) TABLET BY MOUTH DAILY. 12/16/16   Kathyrn Drown, MD  docusate sodium (COLACE) 100 MG capsule Take 100 mg by mouth 2 (two) times daily.    Historical Provider, MD  furosemide (LASIX) 20 MG tablet Take 1 tablet (20 mg total) by mouth daily. 09/23/16   Amy D Clegg, NP  losartan (COZAAR) 25 MG tablet TAKE (1/2) TABLET BY MOUTH DAILY. 12/16/16   Kathyrn Drown, MD  metFORMIN (GLUCOPHAGE) 500 MG tablet One half tablet bid 09/16/16   Kathyrn Drown, MD  nortriptyline (PAMELOR) 10 MG capsule TAKE 1 TO 2 CAPSULES AT  BEDTIME FOR BURNING IN FEET. 12/16/16   Kathyrn Drown, MD  rosuvastatin (CRESTOR) 5 MG tablet TAKE (1) TABLET BY MOUTH ONCE DAILY. 12/16/16   Kathyrn Drown, MD  Sodium Phosphates (FLEET ENEMA RE) Place 1 Dose rectally daily as needed (constipation). Via rectum prn daily for constipation     Historical Provider, MD  spironolactone (ALDACTONE) 25 MG tablet TAKE (1) TABLET DAILY IN THE MORNING. 12/16/16   Kathyrn Drown, MD  traZODone (DESYREL) 100 MG tablet TAKE 1 TABLET BY MOUTH AT BEDTIME. 12/16/16   Kathyrn Drown, MD  warfarin (COUMADIN) 6 MG tablet TAKE 1 TABLET AS DIRECTED. 12/16/16   Kathyrn Drown, MD    Current Medications: . aspirin  81 mg Oral Daily  . bisoprolol  5 mg Oral Daily  . docusate sodium  100 mg Oral BID  . [START ON 02/10/2017] furosemide  20 mg Oral Daily  . insulin aspart  0-5 Units Subcutaneous QHS  . insulin aspart  0-9 Units Subcutaneous TID WC  . losartan  25 mg Oral Daily  .  pantoprazole  40 mg Oral Q0600  . rosuvastatin  5 mg Oral Daily  . sodium chloride flush  3 mL Intravenous Q12H  . traZODone  50 mg Oral QHS  . warfarin  6 mg Oral Once  . [START ON 02/10/2017] Warfarin - Pharmacist Dosing Inpatient   Does not apply Q24H     Allergies:    Allergies  Allergen Reactions  . Ambien [Zolpidem Tartrate] Other (See Comments)    Sleep walks  . Lipitor [Atorvastatin Calcium] Other (See Comments)    myalgias  . Ranitidine Other (See Comments)    Chest discomfort  . Simvastatin Other (See Comments)    Myalgias  . Xanax Xr [Alprazolam Er] Other (See Comments)    Tightness in chest  . Cholestatin Other (See Comments)    UNSPECIFIED REACTION   . Clopidogrel Bisulfate Rash    Social History:   The patient  reports that he quit smoking about 24 years ago. His smoking use included Cigarettes. He started smoking about 66 years ago. He has a 20.00 pack-year smoking history. His smokeless tobacco use includes Chew. He reports that he does not drink alcohol or use drugs.      Family History:   The patient's family history includes Diabetes in his father; Stroke in his mother.   ROS:  Please see the history of present illness. Most of history taken from niece as patient is sleepy. Review of Systems  Constitution: Positive for weakness and malaise/fatigue.  HENT: Negative.   Cardiovascular: Negative.   Respiratory: Negative.   Endocrine: Negative.   Hematologic/Lymphatic: Bruises/bleeds easily.  Musculoskeletal: Positive for arthritis, muscle weakness, myalgias and stiffness.  Gastrointestinal: Negative.   Genitourinary: Negative.   Neurological: Positive for excessive daytime sleepiness and loss of balance.   All other ROS reviewed and negative.      Vital Signs: Blood pressure (!) 145/68, pulse 60, temperature 97.9 F (36.6 C), temperature source Oral, resp. rate 16, height 6\' 1"  (1.854 m), weight 214 lb (97.1 kg), SpO2 98 %.   PHYSICAL EXAM: General:  Well nourished, well developed, in no acute distress  HEENT: normal Lymph: no adenopathy Neck: slight increase JVD Endocrine:  No thryomegaly Vascular: No carotid bruits; FA pulses 2+ bilaterally without bruits  Cardiac:  RRR; normal S1, S2; no significant murmur, rub, bruit, thrill, or heave Lungs:  clear to auscultation bilaterally, no wheezing, rhonchi or rales  Abd: soft, nontender, no hepatomegaly  Ext: no edema, Good distal pulses bilaterally Musculoskeletal:  No deformities, BUE and BLE strength normal and equal Skin: warm and dry  Neuro:  CNs 2-12 intact Psych:  Normal affect    EKG:  Atrial fib with LBBB similar to EKG 08/2016-personally reviewed  Telemetry: Atrial fib with PVC's personally reviewed  Labs:  Recent Labs  02/09/17 0022 02/09/17 0531  TROPONINI 0.05* 0.05*   Lab Results  Component Value Date   WBC 8.4 02/09/2017   HGB 14.8 02/09/2017   HCT 41.1 02/09/2017   MCV 86.5 02/09/2017   PLT 175 02/09/2017    Recent Labs Lab 02/09/17 0022  NA 130*  K 3.5  CL  97*  CO2 21*  BUN 21*  CREATININE 1.31*  CALCIUM 9.5  PROT 7.3  BILITOT 0.9  ALKPHOS 69  ALT 22  AST 25  GLUCOSE 159*   Lab Results  Component Value Date   CHOL 225 (H) 04/13/2016   HDL 34 (L) 04/13/2016   LDLCALC 165 (H) 04/13/2016   TRIG 128 04/13/2016  Lab Results  Component Value Date   DDIMER <0.27 06/28/2016    Radiology/Studies:  Dg Ribs Unilateral W/chest Right  Result Date: 02/09/2017 CLINICAL DATA:  Status post fall onto right rib cage, with anterior lower rib pain. Shortness of breath. Initial encounter. EXAM: RIGHT RIBS AND CHEST - 3+ VIEW COMPARISON:  Chest radiograph performed 12/26/2016 FINDINGS: No displaced rib fractures are seen. The lungs are well-aerated. Minimal bibasilar atelectasis is noted. Peribronchial thickening is noted. There is no evidence of pleural effusion or pneumothorax. The cardiomediastinal silhouette is within normal limits. An aortic valve replacement is noted. No acute osseous abnormalities are seen. IMPRESSION: 1. No displaced rib fracture seen. 2. Minimal bibasilar atelectasis.  Peribronchial thickening noted. Electronically Signed   By: Garald Balding M.D.   On: 02/09/2017 01:29   Ct Head Wo Contrast  Result Date: 02/09/2017 CLINICAL DATA:  83 y/o  M; status post fall. EXAM: CT HEAD WITHOUT CONTRAST TECHNIQUE: Contiguous axial images were obtained from the base of the skull through the vertex without intravenous contrast. COMPARISON:  07/02/2016 CT of the head. FINDINGS: Brain: No evidence of acute infarction, hemorrhage, hydrocephalus, extra-axial collection or mass lesion/mass effect. Stable small chronic infarcts within the right anterior temporal lobe, left lateral temporal lobe, and bilateral cerebellar hemispheres. Stable bilateral cerebellar hemisphere dystrophic calcification. Stable small lucencies in bilateral thalamus probably representing chronic lacunar infarcts. Stable mild diffuse brain parenchymal volume loss. Vascular:  Moderate calcific atherosclerosis of cavernous and paraclinoid internal carotid arteries. Skull: Normal. Negative for fracture or focal lesion. Sinuses/Orbits: No acute finding. Other: Bilateral intra-ocular lens replacement. IMPRESSION: 1. No acute intracranial abnormality identified. No displaced calvarial fracture. 2. Stable chronic infarcts in bilateral temporal lobes, cerebellar hemispheres, and basal ganglia. Electronically Signed   By: Kristine Garbe M.D.   On: 02/09/2017 02:27   Ct Abdomen Pelvis W Contrast  Result Date: 02/09/2017 CLINICAL DATA:  Fall 2 days ago on anticoagulation.  Arm pain. EXAM: CT ABDOMEN AND PELVIS WITH CONTRAST TECHNIQUE: Multidetector CT imaging of the abdomen and pelvis was performed using the standard protocol following bolus administration of intravenous contrast. CONTRAST:  115mL ISOVUE-300 IOPAMIDOL (ISOVUE-300) INJECTION 61% COMPARISON:  CTA 07/05/2016 FINDINGS: Lower chest: Mild motion artifact. No pleural fluid. No consolidation. Postsurgical change of the aortic valve, partially included. Hepatobiliary: No hepatic injury or perihepatic hematoma. Gallbladder is unremarkable Pancreas: Pancreatic atrophy.  No ductal dilatation or inflammation. Spleen: No splenic injury or perisplenic hematoma. Adrenals/Urinary Tract: No adrenal hemorrhage or renal injury identified. Bilateral renal cysts. Extrarenal pelvis configuration on the right. A 2.3 cm lesion in the right kidney was previously intermediate attenuation, currently measures simple fluid and is consistent with simple cyst. Questionable mild bladder wall thickening. No evidence of bladder injury. Stomach/Bowel: No evidence of bowel wall thickening or inflammation. No bowel injury. Rather extensive colonic diverticulosis in the sigmoid colon, lesser diverticulosis elsewhere. No diverticular inflammation. Normal appendix. Small bowel and stomach are unremarkable. No mesenteric hematoma. Vascular/Lymphatic:  Abdominal aorta and IVC are intact. Moderate aortic atherosclerosis. No retroperitoneal fluid. No adenopathy. Reproductive: TURP defect in the prostate. Other: No free air, free fluid, or intra-abdominal fluid collection. No confluent body wall contusion. Musculoskeletal: No acute fracture of the included lower ribs, bony pelvis, or spine. Chronic compression fracture of L2, without progression. IMPRESSION: 1. No acute abnormality or evidence of traumatic injury to the abdomen or pelvis. 2. Previously questioned indeterminate lesion in the right kidney represents a simple cyst. 3. Aortic atherosclerosis.  No aneurysm. 4. Additional stable chronic  and nontraumatic findings as described. Electronically Signed   By: Jeb Levering M.D.   On: 02/09/2017 02:31   2Decho 09/05/16 Study Conclusions   - Left ventricle: The cavity size was normal. There was mild   concentric hypertrophy. Systolic function was moderately reduced.   The estimated ejection fraction was in the range of 35% to 40%.   Diffuse hypokinesis. The study is not technically sufficient to   allow evaluation of LV diastolic function. - Aortic valve: A 33mm Edwards Sapien 3 bioprosthesis was present   and well-seated. Transvalvular velocity was within the normal   range. There was no stenosis. There was no regurgitation. Mean   gradient (S): 7 mm Hg. - Mitral valve: Transvalvular velocity was within the normal range.   There was no evidence for stenosis. There was mild to moderate   regurgitation directed eccentrically and posteriorly. Valve area   by continuity equation (using LVOT flow): 1.18 cm^2. - Left atrium: The atrium was severely dilated. - Right ventricle: The cavity size was normal. Wall thickness was   normal. Systolic function was normal. - Atrial septum: No defect or patent foramen ovale was identified   by color flow Doppler. - Tricuspid valve: There was mild regurgitation. - Pulmonary arteries: Systolic pressure was  within the normal   range. PA peak pressure: 34 mm Hg (S).  Cardiac cath 8/4/17Conclusion     Ost Cx to Prox Cx lesion, 50 %stenosed.  Mid Cx to Dist Cx lesion, 80 %stenosed.  Mid RCA lesion, 40 %stenosed.  Mid LAD lesion, 75 %stenosed.  2nd Diag lesion, 75 %stenosed.  Hemodynamic findings consistent with moderate pulmonary hypertension.  Unable to cross the aortic valve.  PA sat 52%. CO 3.2 L/min.   Continue with plans for CT surgery evaluation, for valve disease and two vessel CAD.       PROBLEM LIST:  Principal Problem:   Acute on chronic combined systolic and diastolic CHF, NYHA class 4 (HCC) Active Problems:   Hypertension   Atrial fibrillation (HCC)   Hyperlipidemia   Dyspnea on exertion   Cognitive dysfunction   CAD (coronary artery disease), native coronary artery   S/P TAVR (transcatheter aortic valve replacement)   Congestive heart failure (CHF) (HCC)   Elevated troponin   Acute on chronic systolic CHF (congestive heart failure) ([place])   Controlled type 2 diabetes mellitus with hyperglycemia ([place])     ASSESSMENT AND PLAN:  Bilateral arm pain suspect secondary to fall 2 days ago. CT head and abdomen stable. Troponins elevated but flat at 0.05 x 2. No chest pain and LBBB on EKG similar to 08/2016. Doubt ACS.  Acute on chronic combined CHF LVEF- received IV Lasix 20 mg in ER. CXR without CHF, BNP 71, and no heart failure on exam now. LVEF 35-40% on cozaar 25 mg. Takes aldactone 25 mg(not on here) and lasix 20 mg at home. F/U with Dr. Haroldine Laws in [place] clinic after discharge.  Chronic atrial fib on Coumadin & bisoprolol 2.5 mg daily.  S/P TAVR for severe AS 07/2016  CAD with 80% Cfx, 75% LAD nonobstructive elsewhere.  HTN-BP has been low as outpatient preventing titration of meds.   Sumner Boast, PA-C  02/09/2017 10:31 AM  Attending note  Patient seen and discussed with PA Bonnell Public, I agree with her documentation above. 83 yo male history of  chronic systolic HF LVEF, aortic stenosis s/p TAVR, carotid stenosis s/p right CEA, afib on coumadin. He is admitted after a mechanical fall at  home. No syncope. Cardiology consulted for possible changes in EKG.     ER vitals: bp 112/43 p 82 97% RA Wt 219 lbs (clinic weight 12/26/16 218 lbs 08/2016 echo: LVEF 35-40%, cannot eval diastolic function, normal AVR, mild to mod MR Hgb 14.8 Plt 175 K 3.5 Cr 1.3 BNP 71 TSH 15 Trop 0.05--> CXR/rib film: no acute process CT head: no acute process CT A/P: no acute process  EKG afib LBBB   LBBB on EKG, subacute and not consistent with acute cardiac pathology. EKG from 08/2016 incomplete LBBB QRS 110ms that has progressed. Do not suspect any acute new pathology. He is at his baselne weight, normal BNP and xray without significant edema. Appears euvolemic by exam. Would continue home cardiac regimen. At this time due not see any active cardiac conditions. Continue f/u with CHF clinic. We will sign off inpatient care.   J Kindell Strada MD

## 2017-02-09 NOTE — Care Management Note (Signed)
Case Management Note  Patient Details  Name: [name] [name] MRN: [id number] Date of Birth: 11/28/1931  Expected Discharge Date:  02/09/17               Expected Discharge Plan:  [place]  In-House Referral:  NA  Discharge planning Services  CM Consult  Post Acute Care Choice:  Home Health Choice offered to:  Patient  HH Arranged:  PT [place] Agency:  [facility]  Status of Service:  Completed, signed off  Additional Comments: Pt discharged to home with orders for [facility] PT. Pt agreeable to services and has chosen [facility] from list of [facility] providers. Pt made aware HH has 48hrs to make first visit. Romualdo Bolk, of [facility], aware of referral and will obtain pt info from chart.  Sherald Barge, RN 02/09/2017, 1:45 PM

## 2017-02-09 NOTE — H&P (Addendum)
History and Physical    [name] [name] [id number] DOB: 1932-03-29 DOA: 02/08/2017  PCP: Sallee Lange, MD  Patient coming from: Home.     Chief Complaint: left arm pain and increase SOB.   HPI: 83 yo with severe AS, s/p TAVR by Dr Burt Knack, hx of combined CHF, went into cardiogenic shock last year, afib on anticogulation, hx of right carotid stenosis, s/p right carotid stent, asthma, lives at home with his wife who is ill, presented to the ER with bilateral arm pain, some SOB, but no nausea, vomiting or diaphoresis tonight.  Work up in the ER included a new LBBB, slightly elevated troponin to 0.05, and clear CXR.  He fell a few days ago, and did have pain on his arm and abdomen.  For that reason, head CT, abdominal CT were done and showed no acute processes.  He has no leukocytosis and his Cr was 1.3.  He was given IV Lasix, and hospitalist was asked to admit him for r/out, elevated troponin, subtherapeutic INR, and new LBBB.  He was given a dose of lovenox for his INR of 1.6.  Currently, he is asymptomatic.    Echo 10/17 EF 35-40%   ED Course:  See above.  Rewiew of Systems:  Constitutional: Negative for malaise, fever and chills. No significant weight loss or weight gain Eyes: Negative for eye pain, redness and discharge, diplopia, visual changes, or flashes of light. ENMT: Negative for ear pain, hoarseness, nasal congestion, sinus pressure and sore throat. No headaches; tinnitus, drooling, or problem swallowing. Cardiovascular: Negative for chest pain, palpitations, diaphoresis, and peripheral edema. ; No orthopnea, PND Respiratory: Negative for cough, hemoptysis, wheezing and stridor. No pleuritic chestpain. Gastrointestinal: Negative for diarrhea, constipation,  melena, blood in stool, hematemesis, jaundice and rectal bleeding.    Genitourinary: Negative for frequency, dysuria, incontinence,flank pain and hematuria; Musculoskeletal: Negative for back pain and neck pain. Negative for  swelling and trauma.;  Skin: . Negative for pruritus, rash, abrasions, bruising and skin lesion.; ulcerations Neuro: Negative for headache, lightheadedness and neck stiffness. Negative for weakness, altered level of consciousness , altered mental status, extremity weakness, burning feet, involuntary movement, seizure and syncope.  Psych: negative for anxiety, depression, insomnia, tearfulness, panic attacks, hallucinations, paranoia, suicidal or homicidal ideation    Past Medical History:  Diagnosis Date  . Aortic stenosis    a. mod-sev by echo 05/2016.  . Asthma   . Cerebrovascular disease 07/2010   TIA; carotid ultrasound in 07/2010-significant bilateral plaque without focal internal carotid artery stenosis; MRI -encephalomalacia left temporal and right temporal lobes; small inferior right cerebellar infarct; small vessel disease  . Chronic atrial fibrillation (HCC)    Paroxysmal; Echocardiogram in 2007-normal EF; mild LVH; left atrial enlargement; mild stenosis and minimal AI; negative stress nuclear study in 2008  . Chronic systolic CHF (congestive heart failure) ([place])    a. dx 05/2016 - EF 35-40%, diffuse HK, mod-severe AS, mod gradient, severe AVA VTI likely due to decreased cardiac output in setting of systolic dysfsunction and significant mitral regurgitation, mild MR, mod-severe MR, severe LAE, mild-mod RV dilation, mild RAE, mild-mod TR, mod PASP 92mmHg.  . CKD (chronic kidney disease), stage II   . Degenerative joint disease    feet and legs  . Diabetes mellitus    no insulin; A1c of 6.6 in 2005  . Dizziness    occurs daily,especially in am  . Exertional dyspnea   . Gastroesophageal reflux disease   . Hepatic steatosis   . History  of noncompliance with medical treatment   . Hyperlipidemia    adverse reactions to statins and niacin  . Hypertension    Borderline  . Irregular heartbeat   . Mitral regurgitation    a. mod-sev by echo 05/2016  . Peripheral vascular disease ([place])     . Renal insufficiency   . S/P TAVR (transcatheter aortic valve replacement) 07/19/2016   29 mm Edwards Sapien 3 transcatheter heart valve placed via left percutaneous transfemoral approach  . Temporal arteritis ([place])   . Tricuspid regurgitation    a. mild-mod TR by echo 05/2016    Past Surgical History:  Procedure Laterality Date  . COLONOSCOPY  2002  . COLONOSCOPY  01/19/2012   Procedure: COLONOSCOPY;  Surgeon: Rogene Houston, MD;  Location: [facility];  Service: Endoscopy;  Laterality: N/A;  1030  . LIPOMA EXCISION  1980  . ORIF ANKLE FRACTURE  2000   Right  . PERIPHERAL VASCULAR CATHETERIZATION N/A 07/11/2016   Procedure: Carotid PTA/Stent Intervention;  Surgeon: Lorretta Harp, MD;  Location: [facility];  Service: Cardiovascular;  Laterality: N/A;  . PROSTATE SURGERY  12/2011  . ROTATOR CUFF REPAIR     Right  . TEE WITHOUT CARDIOVERSION N/A 06/17/2016   Procedure: TRANSESOPHAGEAL ECHOCARDIOGRAM (TEE);  Surgeon: Jerline Pain, MD;  Location: [facility];  Service: Cardiovascular;  Laterality: N/A;  . TEE WITHOUT CARDIOVERSION N/A 07/19/2016   Procedure: TRANSESOPHAGEAL ECHOCARDIOGRAM (TEE);  Surgeon: Sherren Mocha, MD;  Location: [facility];  Service: Open Heart Surgery;  Laterality: N/A;  . TRANSCATHETER AORTIC VALVE REPLACEMENT, TRANSFEMORAL N/A 07/19/2016   Procedure: TRANSCATHETER AORTIC VALVE REPLACEMENT, TRANSFEMORAL;  Surgeon: Sherren Mocha, MD;  Location: [facility];  Service: Open Heart Surgery;  Laterality: N/A;  . TRANSURETHRAL RESECTION OF PROSTATE  09/2011  . URETHRAL STRICTURE DILATATION  1980s     reports that he quit smoking about 24 years ago. His smoking use included Cigarettes. He started smoking about 66 years ago. He has a 20.00 pack-year smoking history. His smokeless tobacco use includes Chew. He reports that he does not drink alcohol or use drugs.  Allergies  Allergen Reactions  . Ambien [Zolpidem Tartrate] Other (See Comments)    Sleep walks  . Lipitor  [Atorvastatin Calcium] Other (See Comments)    myalgias  . Ranitidine Other (See Comments)    Chest discomfort  . Simvastatin Other (See Comments)    Myalgias  . Xanax Xr [Alprazolam Er] Other (See Comments)    Tightness in chest  . Cholestatin Other (See Comments)    UNSPECIFIED REACTION   . Clopidogrel Bisulfate Rash    Family History  Problem Relation Age of Onset  . Stroke Mother   . Diabetes Father   . Colon cancer Neg Hx      Prior to Admission medications   Medication Sig Start Date End Date Taking? Authorizing Provider  bisacodyl (DULCOLAX) 5 MG EC tablet Take 5 mg by mouth daily as needed for moderate constipation.    Historical Provider, MD  bisoprolol (ZEBETA) 5 MG tablet TAKE (1/2) TABLET BY MOUTH DAILY. 12/16/16   Kathyrn Drown, MD  docusate sodium (COLACE) 100 MG capsule Take 100 mg by mouth 2 (two) times daily.    Historical Provider, MD  furosemide (LASIX) 20 MG tablet Take 1 tablet (20 mg total) by mouth daily. 09/23/16   Amy D Ninfa Meeker, NP  losartan (COZAAR) 25 MG tablet TAKE (1/2) TABLET BY MOUTH DAILY. 12/16/16   Kathyrn Drown, MD  metFORMIN (GLUCOPHAGE) 500 MG tablet One half tablet bid 09/16/16   Kathyrn Drown, MD  nortriptyline (PAMELOR) 10 MG capsule TAKE 1 TO 2 CAPSULES AT BEDTIME FOR BURNING IN FEET. 12/16/16   Kathyrn Drown, MD  rosuvastatin (CRESTOR) 5 MG tablet TAKE (1) TABLET BY MOUTH ONCE DAILY. 12/16/16   Kathyrn Drown, MD  Sodium Phosphates (FLEET ENEMA RE) Place 1 Dose rectally daily as needed (constipation). Via rectum prn daily for constipation     Historical Provider, MD  spironolactone (ALDACTONE) 25 MG tablet TAKE (1) TABLET DAILY IN THE MORNING. 12/16/16   Kathyrn Drown, MD  traZODone (DESYREL) 100 MG tablet TAKE 1 TABLET BY MOUTH AT BEDTIME. 12/16/16   Kathyrn Drown, MD  warfarin (COUMADIN) 6 MG tablet TAKE 1 TABLET AS DIRECTED. 12/16/16   Kathyrn Drown, MD    Physical Exam: Vitals:   02/09/17 0300 02/09/17 0307 02/09/17 0330 02/09/17 0400  BP:  (!) 162/80 (!) 162/80 (!) 141/66 (!) 142/106  Pulse: 79 75  72  Resp: 18 (!) 21 19 17   Temp:      TempSrc:      SpO2: 98% 95% 98% 100%  Weight:      Height:       Constitutional: NAD, calm, comfortable Vitals:   02/09/17 0300 02/09/17 0307 02/09/17 0330 02/09/17 0400  BP: (!) 162/80 (!) 162/80 (!) 141/66 (!) 142/106  Pulse: 79 75  72  Resp: 18 (!) 21 19 17   Temp:      TempSrc:      SpO2: 98% 95% 98% 100%  Weight:      Height:       Eyes: PERRL, lids and conjunctivae normal ENMT: Mucous membranes are moist. Posterior pharynx clear of any exudate or lesions.Normal dentition.  Neck: normal, supple, no masses, no thyromegaly Respiratory: clear to auscultation bilaterally, no wheezing, no crackles. Normal respiratory effort. No accessory muscle use.  Cardiovascular: Regular rate and rhythm, no murmurs / rubs / gallops. No extremity edema. 2+ pedal pulses. No carotid bruits.  Abdomen: no tenderness, no masses palpated. No hepatosplenomegaly. Bowel sounds positive.  Musculoskeletal: no clubbing / cyanosis. No joint deformity upper and lower extremities. Good ROM, no contractures. Normal muscle tone.  Skin: no rashes, lesions, ulcers. No induration Neurologic: CN 2-12 grossly intact. Sensation intact, DTR normal. Strength 5/5 in all 4.  Psychiatric: Normal judgment and insight. Alert and oriented x 3. Normal mood.     Labs on Admission: I have personally reviewed following labs and imaging studies  CBC:  Recent Labs Lab 02/09/17 0022  WBC 8.4  NEUTROABS 6.1  HGB 14.8  HCT 41.1  MCV 86.5  PLT 938   Basic Metabolic Panel:  Recent Labs Lab 02/09/17 0022  NA 130*  K 3.5  CL 97*  CO2 21*  GLUCOSE 159*  BUN 21*  CREATININE 1.31*  CALCIUM 9.5   GFR: Estimated Creatinine Clearance: 53.9 mL/min (A) (by C-G formula based on SCr of 1.31 mg/dL (H)). Liver Function Tests:  Recent Labs Lab 02/09/17 0022  AST 25  ALT 22  ALKPHOS 69  BILITOT 0.9  PROT 7.3  ALBUMIN  3.8   Coagulation Profile:  Recent Labs Lab 02/09/17 0022  INR 1.64   Cardiac Enzymes:  Recent Labs Lab 02/09/17 0022  TROPONINI 0.05*   Urine analysis:    Component Value Date/Time   COLORURINE RED (A) 07/11/2016 0225   APPEARANCEUR TURBID (A) 07/11/2016 0225   LABSPEC 1.018 07/11/2016 0225  PHURINE 5.0 07/11/2016 0225   GLUCOSEU NEGATIVE 07/11/2016 0225   HGBUR LARGE (A) 07/11/2016 0225   BILIRUBINUR LARGE (A) 07/11/2016 0225   BILIRUBINUR + 06/23/2014 1707   KETONESUR 15 (A) 07/11/2016 0225   PROTEINUR >300 (A) 07/11/2016 0225   UROBILINOGEN 0.2 08/20/2015 2030   NITRITE POSITIVE (A) 07/11/2016 0225   LEUKOCYTESUR MODERATE (A) 07/11/2016 0225   Radiological Exams on Admission: Dg Ribs Unilateral W/chest Right  Result Date: 02/09/2017 CLINICAL DATA:  Status post fall onto right rib cage, with anterior lower rib pain. Shortness of breath. Initial encounter. EXAM: RIGHT RIBS AND CHEST - 3+ VIEW COMPARISON:  Chest radiograph performed 12/26/2016 FINDINGS: No displaced rib fractures are seen. The lungs are well-aerated. Minimal bibasilar atelectasis is noted. Peribronchial thickening is noted. There is no evidence of pleural effusion or pneumothorax. The cardiomediastinal silhouette is within normal limits. An aortic valve replacement is noted. No acute osseous abnormalities are seen. IMPRESSION: 1. No displaced rib fracture seen. 2. Minimal bibasilar atelectasis.  Peribronchial thickening noted. Electronically Signed   By: Garald Balding M.D.   On: 02/09/2017 01:29   Ct Head Wo Contrast  Result Date: 02/09/2017 CLINICAL DATA:  83 y/o  M; status post fall. EXAM: CT HEAD WITHOUT CONTRAST TECHNIQUE: Contiguous axial images were obtained from the base of the skull through the vertex without intravenous contrast. COMPARISON:  07/02/2016 CT of the head. FINDINGS: Brain: No evidence of acute infarction, hemorrhage, hydrocephalus, extra-axial collection or mass lesion/mass effect.  Stable small chronic infarcts within the right anterior temporal lobe, left lateral temporal lobe, and bilateral cerebellar hemispheres. Stable bilateral cerebellar hemisphere dystrophic calcification. Stable small lucencies in bilateral thalamus probably representing chronic lacunar infarcts. Stable mild diffuse brain parenchymal volume loss. Vascular: Moderate calcific atherosclerosis of cavernous and paraclinoid internal carotid arteries. Skull: Normal. Negative for fracture or focal lesion. Sinuses/Orbits: No acute finding. Other: Bilateral intra-ocular lens replacement. IMPRESSION: 1. No acute intracranial abnormality identified. No displaced calvarial fracture. 2. Stable chronic infarcts in bilateral temporal lobes, cerebellar hemispheres, and basal ganglia. Electronically Signed   By: Kristine Garbe M.D.   On: 02/09/2017 02:27   Ct Abdomen Pelvis W Contrast  Result Date: 02/09/2017 CLINICAL DATA:  Fall 2 days ago on anticoagulation.  Arm pain. EXAM: CT ABDOMEN AND PELVIS WITH CONTRAST TECHNIQUE: Multidetector CT imaging of the abdomen and pelvis was performed using the standard protocol following bolus administration of intravenous contrast. CONTRAST:  166mL ISOVUE-300 IOPAMIDOL (ISOVUE-300) INJECTION 61% COMPARISON:  CTA 07/05/2016 FINDINGS: Lower chest: Mild motion artifact. No pleural fluid. No consolidation. Postsurgical change of the aortic valve, partially included. Hepatobiliary: No hepatic injury or perihepatic hematoma. Gallbladder is unremarkable Pancreas: Pancreatic atrophy.  No ductal dilatation or inflammation. Spleen: No splenic injury or perisplenic hematoma. Adrenals/Urinary Tract: No adrenal hemorrhage or renal injury identified. Bilateral renal cysts. Extrarenal pelvis configuration on the right. A 2.3 cm lesion in the right kidney was previously intermediate attenuation, currently measures simple fluid and is consistent with simple cyst. Questionable mild bladder wall  thickening. No evidence of bladder injury. Stomach/Bowel: No evidence of bowel wall thickening or inflammation. No bowel injury. Rather extensive colonic diverticulosis in the sigmoid colon, lesser diverticulosis elsewhere. No diverticular inflammation. Normal appendix. Small bowel and stomach are unremarkable. No mesenteric hematoma. Vascular/Lymphatic: Abdominal aorta and IVC are intact. Moderate aortic atherosclerosis. No retroperitoneal fluid. No adenopathy. Reproductive: TURP defect in the prostate. Other: No free air, free fluid, or intra-abdominal fluid collection. No confluent body wall contusion. Musculoskeletal: No acute fracture  of the included lower ribs, bony pelvis, or spine. Chronic compression fracture of L2, without progression. IMPRESSION: 1. No acute abnormality or evidence of traumatic injury to the abdomen or pelvis. 2. Previously questioned indeterminate lesion in the right kidney represents a simple cyst. 3. Aortic atherosclerosis.  No aneurysm. 4. Additional stable chronic and nontraumatic findings as described. Electronically Signed   By: Jeb Levering M.D.   On: 02/09/2017 02:31    EKG: Independently reviewed.   Assessment/Plan Principal Problem:   Acute on chronic combined systolic and diastolic CHF, NYHA class 4 (HCC) Active Problems:   Hypertension   Atrial fibrillation (HCC)   Hyperlipidemia   Dyspnea on exertion   Cognitive dysfunction   CAD (coronary artery disease), native coronary artery   S/P TAVR (transcatheter aortic valve replacement)   Congestive heart failure (CHF) (HCC)    PLAN:   Acute on chronic CHF:  Will continue with IV Diuresis.  Check I and O and daily weight.  Follow Cr carefully.   Obtain ECHO.    New LBBB:  Check ECHO, r/out with serial troponins. Add ASA and PPI to his regimen.  CKD:  Stable.  Follow Cr.  Afib:  Rate is controlled.  Continue with coumadin per pharmacy dosing.   Cognitive dysfunction:  I suspect he has early dementia.   Will defer to PCP on this.   DM:  Hold metformin.  Place on carb modified diet.  Sensitive SSI.    DVT prophylaxis: Coumadin.  Code Status: FULL CODE.  Family Communication: Niece at bedside.  Disposition Plan: Home when appropriate.  Consults called: None.  Admission status: OBS.    Dantre Yearwood MD FACP. [facility]  If 7PM-7AM, please contact night-coverage [email or web address] Password [email or web address]  02/09/2017, 4:10 AM

## 2017-02-13 DIAGNOSIS — I482 Chronic atrial fibrillation: Secondary | ICD-10-CM | POA: Diagnosis not present

## 2017-02-13 DIAGNOSIS — Z7984 Long term (current) use of oral hypoglycemic drugs: Secondary | ICD-10-CM | POA: Diagnosis not present

## 2017-02-13 DIAGNOSIS — E785 Hyperlipidemia, unspecified: Secondary | ICD-10-CM | POA: Diagnosis not present

## 2017-02-13 DIAGNOSIS — E1122 Type 2 diabetes mellitus with diabetic chronic kidney disease: Secondary | ICD-10-CM | POA: Diagnosis not present

## 2017-02-13 DIAGNOSIS — I130 Hypertensive heart and chronic kidney disease with heart failure and stage 1 through stage 4 chronic kidney disease, or unspecified chronic kidney disease: Secondary | ICD-10-CM | POA: Diagnosis not present

## 2017-02-13 DIAGNOSIS — Z7951 Long term (current) use of inhaled steroids: Secondary | ICD-10-CM | POA: Diagnosis not present

## 2017-02-13 DIAGNOSIS — Z79891 Long term (current) use of opiate analgesic: Secondary | ICD-10-CM | POA: Diagnosis not present

## 2017-02-13 DIAGNOSIS — Z7901 Long term (current) use of anticoagulants: Secondary | ICD-10-CM | POA: Diagnosis not present

## 2017-02-13 DIAGNOSIS — I5023 Acute on chronic systolic (congestive) heart failure: Secondary | ICD-10-CM | POA: Diagnosis not present

## 2017-02-13 DIAGNOSIS — N183 Chronic kidney disease, stage 3 (moderate): Secondary | ICD-10-CM | POA: Diagnosis not present

## 2017-02-13 DIAGNOSIS — I350 Nonrheumatic aortic (valve) stenosis: Secondary | ICD-10-CM | POA: Diagnosis not present

## 2017-02-14 ENCOUNTER — Ambulatory Visit (INDEPENDENT_AMBULATORY_CARE_PROVIDER_SITE_OTHER): Payer: PPO | Admitting: *Deleted

## 2017-02-14 DIAGNOSIS — Z7901 Long term (current) use of anticoagulants: Secondary | ICD-10-CM | POA: Diagnosis not present

## 2017-02-14 LAB — POCT INR: INR: 2.5

## 2017-02-21 ENCOUNTER — Inpatient Hospital Stay (HOSPITAL_COMMUNITY)
Admission: EM | Admit: 2017-02-21 | Discharge: 2017-02-24 | DRG: 871 | Disposition: A | Discharge status: Home Health | Payer: PPO | Attending: Internal Medicine | Admitting: Internal Medicine

## 2017-02-21 ENCOUNTER — Emergency Department (HOSPITAL_COMMUNITY): Payer: PPO

## 2017-02-21 ENCOUNTER — Encounter (HOSPITAL_COMMUNITY): Payer: Self-pay | Admitting: Emergency Medicine

## 2017-02-21 ENCOUNTER — Telehealth: Payer: Self-pay | Admitting: *Deleted

## 2017-02-21 DIAGNOSIS — E860 Dehydration: Secondary | ICD-10-CM | POA: Diagnosis present

## 2017-02-21 DIAGNOSIS — Z833 Family history of diabetes mellitus: Secondary | ICD-10-CM

## 2017-02-21 DIAGNOSIS — I10 Essential (primary) hypertension: Secondary | ICD-10-CM | POA: Diagnosis not present

## 2017-02-21 DIAGNOSIS — F17220 Nicotine dependence, chewing tobacco, uncomplicated: Secondary | ICD-10-CM | POA: Diagnosis present

## 2017-02-21 DIAGNOSIS — J810 Acute pulmonary edema: Secondary | ICD-10-CM | POA: Diagnosis not present

## 2017-02-21 DIAGNOSIS — I959 Hypotension, unspecified: Secondary | ICD-10-CM | POA: Diagnosis present

## 2017-02-21 DIAGNOSIS — E1151 Type 2 diabetes mellitus with diabetic peripheral angiopathy without gangrene: Secondary | ICD-10-CM | POA: Diagnosis present

## 2017-02-21 DIAGNOSIS — I4891 Unspecified atrial fibrillation: Secondary | ICD-10-CM | POA: Diagnosis present

## 2017-02-21 DIAGNOSIS — R0902 Hypoxemia: Secondary | ICD-10-CM

## 2017-02-21 DIAGNOSIS — I447 Left bundle-branch block, unspecified: Secondary | ICD-10-CM | POA: Diagnosis not present

## 2017-02-21 DIAGNOSIS — E1129 Type 2 diabetes mellitus with other diabetic kidney complication: Secondary | ICD-10-CM

## 2017-02-21 DIAGNOSIS — A419 Sepsis, unspecified organism: Secondary | ICD-10-CM | POA: Diagnosis not present

## 2017-02-21 DIAGNOSIS — E785 Hyperlipidemia, unspecified: Secondary | ICD-10-CM | POA: Diagnosis present

## 2017-02-21 DIAGNOSIS — E119 Type 2 diabetes mellitus without complications: Secondary | ICD-10-CM

## 2017-02-21 DIAGNOSIS — Z888 Allergy status to other drugs, medicaments and biological substances status: Secondary | ICD-10-CM | POA: Diagnosis not present

## 2017-02-21 DIAGNOSIS — N179 Acute kidney failure, unspecified: Secondary | ICD-10-CM | POA: Diagnosis not present

## 2017-02-21 DIAGNOSIS — I5023 Acute on chronic systolic (congestive) heart failure: Secondary | ICD-10-CM | POA: Diagnosis present

## 2017-02-21 DIAGNOSIS — Z952 Presence of prosthetic heart valve: Secondary | ICD-10-CM

## 2017-02-21 DIAGNOSIS — Z22322 Carrier or suspected carrier of Methicillin resistant Staphylococcus aureus: Secondary | ICD-10-CM

## 2017-02-21 DIAGNOSIS — I130 Hypertensive heart and chronic kidney disease with heart failure and stage 1 through stage 4 chronic kidney disease, or unspecified chronic kidney disease: Secondary | ICD-10-CM | POA: Diagnosis present

## 2017-02-21 DIAGNOSIS — E1122 Type 2 diabetes mellitus with diabetic chronic kidney disease: Secondary | ICD-10-CM | POA: Diagnosis present

## 2017-02-21 DIAGNOSIS — I25119 Atherosclerotic heart disease of native coronary artery with unspecified angina pectoris: Secondary | ICD-10-CM | POA: Diagnosis present

## 2017-02-21 DIAGNOSIS — J9601 Acute respiratory failure with hypoxia: Secondary | ICD-10-CM | POA: Diagnosis present

## 2017-02-21 DIAGNOSIS — I5043 Acute on chronic combined systolic (congestive) and diastolic (congestive) heart failure: Secondary | ICD-10-CM | POA: Diagnosis not present

## 2017-02-21 DIAGNOSIS — R4182 Altered mental status, unspecified: Secondary | ICD-10-CM

## 2017-02-21 DIAGNOSIS — J45909 Unspecified asthma, uncomplicated: Secondary | ICD-10-CM | POA: Diagnosis present

## 2017-02-21 DIAGNOSIS — R7989 Other specified abnormal findings of blood chemistry: Secondary | ICD-10-CM | POA: Diagnosis not present

## 2017-02-21 DIAGNOSIS — I5022 Chronic systolic (congestive) heart failure: Secondary | ICD-10-CM | POA: Diagnosis present

## 2017-02-21 DIAGNOSIS — R410 Disorientation, unspecified: Secondary | ICD-10-CM

## 2017-02-21 DIAGNOSIS — R001 Bradycardia, unspecified: Secondary | ICD-10-CM | POA: Diagnosis not present

## 2017-02-21 DIAGNOSIS — R0602 Shortness of breath: Secondary | ICD-10-CM | POA: Diagnosis not present

## 2017-02-21 DIAGNOSIS — Z7901 Long term (current) use of anticoagulants: Secondary | ICD-10-CM

## 2017-02-21 DIAGNOSIS — N182 Chronic kidney disease, stage 2 (mild): Secondary | ICD-10-CM | POA: Diagnosis not present

## 2017-02-21 DIAGNOSIS — Z79899 Other long term (current) drug therapy: Secondary | ICD-10-CM

## 2017-02-21 DIAGNOSIS — Z823 Family history of stroke: Secondary | ICD-10-CM

## 2017-02-21 DIAGNOSIS — G9341 Metabolic encephalopathy: Secondary | ICD-10-CM | POA: Diagnosis present

## 2017-02-21 DIAGNOSIS — E1142 Type 2 diabetes mellitus with diabetic polyneuropathy: Secondary | ICD-10-CM | POA: Diagnosis not present

## 2017-02-21 DIAGNOSIS — D72829 Elevated white blood cell count, unspecified: Secondary | ICD-10-CM | POA: Diagnosis not present

## 2017-02-21 DIAGNOSIS — R451 Restlessness and agitation: Secondary | ICD-10-CM | POA: Diagnosis not present

## 2017-02-21 DIAGNOSIS — R0600 Dyspnea, unspecified: Secondary | ICD-10-CM | POA: Diagnosis not present

## 2017-02-21 DIAGNOSIS — Z7984 Long term (current) use of oral hypoglycemic drugs: Secondary | ICD-10-CM

## 2017-02-21 DIAGNOSIS — I2510 Atherosclerotic heart disease of native coronary artery without angina pectoris: Secondary | ICD-10-CM | POA: Diagnosis present

## 2017-02-21 DIAGNOSIS — Z8673 Personal history of transient ischemic attack (TIA), and cerebral infarction without residual deficits: Secondary | ICD-10-CM | POA: Diagnosis not present

## 2017-02-21 DIAGNOSIS — I482 Chronic atrial fibrillation: Secondary | ICD-10-CM

## 2017-02-21 DIAGNOSIS — I350 Nonrheumatic aortic (valve) stenosis: Secondary | ICD-10-CM | POA: Diagnosis not present

## 2017-02-21 DIAGNOSIS — Z87891 Personal history of nicotine dependence: Secondary | ICD-10-CM | POA: Diagnosis not present

## 2017-02-21 HISTORY — DX: Altered mental status, unspecified: R41.82

## 2017-02-21 LAB — BLOOD GAS, ARTERIAL
Drawn by: 234301
O2 Content: 3 L/min
O2 SAT: 99.1 %
PO2 ART: 133 mmHg — AB (ref 83.0–108.0)

## 2017-02-21 LAB — LACTIC ACID, PLASMA: Lactic Acid, Venous: 3.5 mmol/L (ref 0.5–1.9)

## 2017-02-21 LAB — CBC WITH DIFFERENTIAL/PLATELET
BASOS ABS: 0 10*3/uL (ref 0.0–0.1)
Basophils Relative: 0 %
Eosinophils Absolute: 0.2 10*3/uL (ref 0.0–0.7)
Eosinophils Relative: 1 %
HCT: 43.4 % (ref 39.0–52.0)
HEMOGLOBIN: 15.9 g/dL (ref 13.0–17.0)
Lymphocytes Relative: 11 %
Lymphs Abs: 1.2 10*3/uL (ref 0.7–4.0)
MCH: 31.1 pg (ref 26.0–34.0)
MCHC: 36.6 g/dL — ABNORMAL HIGH (ref 30.0–36.0)
MCV: 84.9 fL (ref 78.0–100.0)
MONO ABS: 0.8 10*3/uL (ref 0.1–1.0)
MONOS PCT: 7 %
NEUTROS ABS: 9.2 10*3/uL — AB (ref 1.7–7.7)
Neutrophils Relative %: 81 %
Platelets: 194 10*3/uL (ref 150–400)
RBC: 5.11 MIL/uL (ref 4.22–5.81)
RDW: 13.4 % (ref 11.5–15.5)
WBC: 11.4 10*3/uL — ABNORMAL HIGH (ref 4.0–10.5)

## 2017-02-21 LAB — I-STAT CG4 LACTIC ACID, ED: Lactic Acid, Venous: 4.46 mmol/L (ref 0.5–1.9)

## 2017-02-21 LAB — I-STAT TROPONIN, ED: Troponin i, poc: 0.01 ng/mL (ref 0.00–0.08)

## 2017-02-21 LAB — COMPREHENSIVE METABOLIC PANEL
ALBUMIN: 4.1 g/dL (ref 3.5–5.0)
ALT: 23 U/L (ref 17–63)
ANION GAP: 14 (ref 5–15)
AST: 26 U/L (ref 15–41)
Alkaline Phosphatase: 88 U/L (ref 38–126)
BUN: 26 mg/dL — ABNORMAL HIGH (ref 6–20)
CO2: 13 mmol/L — AB (ref 22–32)
Calcium: 9.7 mg/dL (ref 8.9–10.3)
Chloride: 103 mmol/L (ref 101–111)
Creatinine, Ser: 1.58 mg/dL — ABNORMAL HIGH (ref 0.61–1.24)
GFR calc Af Amer: 45 mL/min — ABNORMAL LOW (ref >60)
GFR calc non Af Amer: 39 mL/min — ABNORMAL LOW (ref >60)
GLUCOSE: 161 mg/dL — AB (ref 65–99)
POTASSIUM: 4 mmol/L (ref 3.5–5.1)
Sodium: 130 mmol/L — ABNORMAL LOW (ref 135–145)
Total Bilirubin: 1.3 mg/dL — ABNORMAL HIGH (ref 0.3–1.2)
Total Protein: 7.8 g/dL (ref 6.5–8.1)

## 2017-02-21 LAB — URINALYSIS, ROUTINE W REFLEX MICROSCOPIC
Bilirubin Urine: NEGATIVE
Glucose, UA: NEGATIVE mg/dL
Hgb urine dipstick: NEGATIVE
KETONES UR: 5 mg/dL — AB
LEUKOCYTES UA: NEGATIVE
NITRITE: NEGATIVE
PH: 7 (ref 5.0–8.0)
PROTEIN: NEGATIVE mg/dL
Specific Gravity, Urine: 1.014 (ref 1.005–1.030)

## 2017-02-21 LAB — PROTIME-INR
INR: 1.9
Prothrombin Time: 22.1 seconds — ABNORMAL HIGH (ref 11.4–15.2)

## 2017-02-21 LAB — INFLUENZA PANEL BY PCR (TYPE A & B)
Influenza A By PCR: NEGATIVE
Influenza B By PCR: NEGATIVE

## 2017-02-21 LAB — BRAIN NATRIURETIC PEPTIDE: B Natriuretic Peptide: 55 pg/mL (ref 0.0–100.0)

## 2017-02-21 MED ORDER — SODIUM CHLORIDE 0.9 % IV BOLUS (SEPSIS)
1000.0000 mL | Freq: Once | INTRAVENOUS | Status: AC
  Administered 2017-02-21: 1000 mL via INTRAVENOUS

## 2017-02-21 MED ORDER — IPRATROPIUM-ALBUTEROL 0.5-2.5 (3) MG/3ML IN SOLN
3.0000 mL | Freq: Once | RESPIRATORY_TRACT | Status: AC
  Administered 2017-02-21: 3 mL via RESPIRATORY_TRACT
  Filled 2017-02-21: qty 3

## 2017-02-21 MED ORDER — IOPAMIDOL (ISOVUE-370) INJECTION 76%
100.0000 mL | Freq: Once | INTRAVENOUS | Status: AC | PRN
  Administered 2017-02-21: 100 mL via INTRAVENOUS

## 2017-02-21 MED ORDER — VANCOMYCIN HCL IN DEXTROSE 1-5 GM/200ML-% IV SOLN
1000.0000 mg | Freq: Once | INTRAVENOUS | Status: AC
  Administered 2017-02-21: 1000 mg via INTRAVENOUS
  Filled 2017-02-21: qty 200

## 2017-02-21 MED ORDER — OSELTAMIVIR PHOSPHATE 75 MG PO CAPS
75.0000 mg | ORAL_CAPSULE | Freq: Once | ORAL | Status: AC
  Administered 2017-02-21: 75 mg via ORAL
  Filled 2017-02-21: qty 1

## 2017-02-21 MED ORDER — HALOPERIDOL LACTATE 5 MG/ML IJ SOLN
2.0000 mg | Freq: Once | INTRAMUSCULAR | Status: AC
  Administered 2017-02-21: 2 mg via INTRAVENOUS
  Filled 2017-02-21: qty 1

## 2017-02-21 MED ORDER — LORAZEPAM 2 MG/ML IJ SOLN
1.0000 mg | Freq: Once | INTRAMUSCULAR | Status: AC
  Administered 2017-02-21: 1 mg via INTRAVENOUS
  Filled 2017-02-21: qty 1

## 2017-02-21 MED ORDER — LORAZEPAM 2 MG/ML IJ SOLN
0.5000 mg | Freq: Once | INTRAMUSCULAR | Status: AC
  Administered 2017-02-21: 0.5 mg via INTRAVENOUS
  Filled 2017-02-21: qty 1

## 2017-02-21 MED ORDER — PIPERACILLIN-TAZOBACTAM 3.375 G IVPB
3.3750 g | Freq: Three times a day (TID) | INTRAVENOUS | Status: DC
  Administered 2017-02-22 – 2017-02-24 (×7): 3.375 g via INTRAVENOUS
  Filled 2017-02-21 (×6): qty 50

## 2017-02-21 MED ORDER — PIPERACILLIN-TAZOBACTAM 3.375 G IVPB 30 MIN
3.3750 g | Freq: Once | INTRAVENOUS | Status: AC
  Administered 2017-02-21: 3.375 g via INTRAVENOUS
  Filled 2017-02-21: qty 50

## 2017-02-21 MED ORDER — ACETAMINOPHEN 325 MG PO TABS
650.0000 mg | ORAL_TABLET | Freq: Once | ORAL | Status: AC
  Administered 2017-02-21: 650 mg via ORAL
  Filled 2017-02-21: qty 2

## 2017-02-21 MED ORDER — VANCOMYCIN HCL IN DEXTROSE 1-5 GM/200ML-% IV SOLN
1000.0000 mg | Freq: Once | INTRAVENOUS | Status: AC
  Administered 2017-02-21: 1000 mg via INTRAVENOUS
  Filled 2017-02-21 (×2): qty 200

## 2017-02-21 NOTE — ED Notes (Signed)
CRITICAL VALUE ALERT  Critical value received: pH highter than 7.75, pco2 lower than 19.0  Date of notification: 02/21/17  Time of notification:  7616  Critical value read back:Yes.    Nurse who received alert:  c Satori Krabill rn   Responding MD:  Dr long  Time MD responded:  [phone]

## 2017-02-21 NOTE — ED Notes (Signed)
CRITICAL VALUE ALERT  Critical value received:  Lactic Acid 3.5  Date of notification:  02/21/17  Time of notification:  2005  Critical value read back:Yes.    Nurse who received alert:  Y. Delle Andrzejewski, RN  Responding MD:  Dr. Laverta Baltimore  Time MD responded:  2006 hrs

## 2017-02-21 NOTE — Telephone Encounter (Signed)
Home health nurse called to report patient with a drop of O2 today after taking a shower. Family stated his O2 dropped to 49% and they put him on his wife's oxygen. Home health nurse stated his O2 level was in the 90s on oxygen when she arrived but he was very weak, unsteady and SOB. Consult with Dr Nicki Reaper- advised that patient needs to go straight to ER for evaluation and treatment. Home health nurse verbalized understanding and stated she will send patient to ER.

## 2017-02-21 NOTE — ED Notes (Signed)
Patient is continuously confused, agitated, and restless, pulling off leads, gown and blankets, will replaced leads when returning from CT.

## 2017-02-21 NOTE — Progress Notes (Signed)
Pharmacy Antibiotic Note  MARKEESE BOYAJIAN is a 83 y.o. male admitted on 02/21/2017 with sepsis.  Pharmacy has been consulted for Vancomycin and zosyn dosing. Vanc 1 gm and zosyn 3.375 gm ordered in the ED  Plan: Give additional 1 gm vanc for total loading dose of 2 gm then 1250 mg IV q24 hours Cont zosyn 3.375 gm IV q8 hours f/u renal function, cultures and clinical course  Height: 6\' 1"  (185.4 cm) Weight: 214 lb (97.1 kg) IBW/kg (Calculated) : 79.9  Temp (24hrs), Avg:98.4 F (36.9 C), Min:98.4 F (36.9 C), Max:98.4 F (36.9 C)   Recent Labs Lab 02/21/17 1747 02/21/17 1759  WBC 11.4*  --   CREATININE 1.58*  --   LATICACIDVEN  --  4.46*    Estimated Creatinine Clearance: 44.3 mL/min (A) (by C-G formula based on SCr of 1.58 mg/dL (H)).    Allergies  Allergen Reactions  . Ambien [Zolpidem Tartrate] Other (See Comments)    Sleep walks  . Lipitor [Atorvastatin Calcium] Other (See Comments)    myalgias  . Ranitidine Other (See Comments)    Chest discomfort  . Simvastatin Other (See Comments)    Myalgias  . Xanax Xr [Alprazolam Er] Other (See Comments)    Tightness in chest  . Cholestatin Other (See Comments)    UNSPECIFIED REACTION   . Clopidogrel Bisulfate Rash    Antimicrobials this admission: zosyn 4/10 >>  vanc 4/10 >>     Thank you for allowing pharmacy to be a part of this patient's care.  Beverlee Nims 02/21/2017 6:53 PM

## 2017-02-21 NOTE — ED Notes (Signed)
EKG given to Dr. Long 

## 2017-02-21 NOTE — ED Provider Notes (Signed)
Emergency Department Provider Note   I have reviewed the triage vital signs and the nursing notes.  Level 5 caveat: Confusion and respiratory distress  HISTORY  Chief Complaint Respiratory Distress   HPI [name] [name] is a 83 y.o. male with PMH of AS, TIA, chronic a-fib, CKD, DM, HLD presents to the emergency department for evaluation of acute onset dyspnea and confusion. The patient lives at home with his wife and a CNA who assists both of them. He went asked her to take a shower and when he returned the CNA noted significant dyspnea and cyanosis in the fingers. He seemed be having difficulty breathing and confusion. The CNA called family who transported him to the emergency department. Patient denies any chest pain. Family member at bedside states that he had a fall several weeks ago with no clear injury. He is also evaluated recently for a large pleural effusion. No fever or chills. The patient denies any abdominal pain or dysuria.   Past Medical History:  Diagnosis Date  . Aortic stenosis    a. mod-sev by echo 05/2016.  . Asthma   . Cerebrovascular disease 07/2010   TIA; carotid ultrasound in 07/2010-significant bilateral plaque without focal internal carotid artery stenosis; MRI -encephalomalacia left temporal and right temporal lobes; small inferior right cerebellar infarct; small vessel disease  . Chronic atrial fibrillation (HCC)    Paroxysmal; Echocardiogram in 2007-normal EF; mild LVH; left atrial enlargement; mild stenosis and minimal AI; negative stress nuclear study in 2008  . Chronic systolic CHF (congestive heart failure) ([place])    a. dx 05/2016 - EF 35-40%, diffuse HK, mod-severe AS, mod gradient, severe AVA VTI likely due to decreased cardiac output in setting of systolic dysfsunction and significant mitral regurgitation, mild MR, mod-severe MR, severe LAE, mild-mod RV dilation, mild RAE, mild-mod TR, mod PASP 83mmHg.  . CKD (chronic kidney disease), stage II   .  Degenerative joint disease    feet and legs  . Diabetes mellitus    no insulin; A1c of 6.6 in 2005  . Dizziness    occurs daily,especially in am  . Exertional dyspnea   . Gastroesophageal reflux disease   . Hepatic steatosis   . History of noncompliance with medical treatment   . Hyperlipidemia    adverse reactions to statins and niacin  . Hypertension    Borderline  . Irregular heartbeat   . Mitral regurgitation    a. mod-sev by echo 05/2016  . Peripheral vascular disease ([place])   . Renal insufficiency   . S/P TAVR (transcatheter aortic valve replacement) 07/19/2016   29 mm Edwards Sapien 3 transcatheter heart valve placed via left percutaneous transfemoral approach  . Temporal arteritis ([place])   . Tricuspid regurgitation    a. mild-mod TR by echo 05/2016    Patient Active Problem List   Diagnosis Date Noted  . Altered mental status 02/21/2017  . Congestive heart failure (CHF) ([place]) 02/09/2017  . Acute on chronic systolic CHF (congestive heart failure) ([place]) 02/09/2017  . Controlled type 2 diabetes mellitus with hyperglycemia ([place]) 02/09/2017  . Elevated troponin   . S/P TAVR (transcatheter aortic valve replacement) 07/19/2016  . Mitral regurgitation   . VT (ventricular tachycardia) ([place])   . Cardiogenic shock ([place])   . Acute respiratory failure with hypoxia ([place])   . Dyspnea 06/28/2016  . CAD (coronary artery disease), native coronary artery 06/28/2016  . Acute on chronic combined systolic and diastolic CHF, NYHA class 4 ([place])   . Anemia  of chronic disease 04/12/2016  . Orthostatic hypotension 08/20/2015  . AKI (acute kidney injury) ([place]) 08/20/2015  . Asthma exacerbation 08/20/2015  . Cognitive dysfunction 05/29/2015  . Chronic back pain 07/29/2014  . Lumbar pain 06/23/2014  . Chronic pain of right ankle 05/30/2014  . Diabetic neuropathy ([place]) 05/30/2014  . Chest congestion 05/02/2013  . Wheezing 05/02/2013  . Unspecified constipation 04/30/2013  . Leg edema, left  04/27/2013  . Carotid stenosis 04/25/2012  . BPH (benign prostatic hypertrophy) with urinary obstruction 09/27/2011  . Dyspnea on exertion 08/01/2011  . Diabetes mellitus ([place]) 07/14/2011  . Hypertension   . Atrial fibrillation ([place])   . Hyperlipidemia   . History of noncompliance with medical treatment   . Aortic stenosis   . Cerebrovascular disease 07/15/2010    Past Surgical History:  Procedure Laterality Date  . COLONOSCOPY  2002  . COLONOSCOPY  01/19/2012   Procedure: COLONOSCOPY;  Surgeon: Rogene Houston, MD;  [facility];  Service: Endoscopy;  Laterality: N/A;  1030  . LIPOMA EXCISION  1980  . ORIF ANKLE FRACTURE  2000   Right  . PERIPHERAL VASCULAR CATHETERIZATION N/A 07/11/2016   Procedure: Carotid PTA/Stent Intervention;  Surgeon: Lorretta Harp, MD;  Location: [facility];  Service: Cardiovascular;  Laterality: N/A;  . PROSTATE SURGERY  12/2011  . ROTATOR CUFF REPAIR     Right  . TEE WITHOUT CARDIOVERSION N/A 06/17/2016   Procedure: TRANSESOPHAGEAL ECHOCARDIOGRAM (TEE);  Surgeon: Jerline Pain, MD;  Location: [facility];  Service: Cardiovascular;  Laterality: N/A;  . TEE WITHOUT CARDIOVERSION N/A 07/19/2016   Procedure: TRANSESOPHAGEAL ECHOCARDIOGRAM (TEE);  Surgeon: Sherren Mocha, MD;  Location: [facility];  Service: Open Heart Surgery;  Laterality: N/A;  . TRANSCATHETER AORTIC VALVE REPLACEMENT, TRANSFEMORAL N/A 07/19/2016   Procedure: TRANSCATHETER AORTIC VALVE REPLACEMENT, TRANSFEMORAL;  Surgeon: Sherren Mocha, MD;  Location: [facility];  Service: Open Heart Surgery;  Laterality: N/A;  . TRANSURETHRAL RESECTION OF PROSTATE  09/2011  . URETHRAL STRICTURE DILATATION  1980s    Current Outpatient Rx  . Order #: [id number] Class: Normal  . Order #: [id number] Class: Normal  . Order #: [id number] Class: Normal  . Order #: [id number] Class: Normal  . Order #: [id number] Class: Normal  . Order #: [id number] Class: Normal  . Order #: [id number] Class: Historical Med  . Order  #: [id number] Class: Normal  . Order #: [id number] Class: Normal  . Order #: [id number] Class: Normal    Allergies Ambien [zolpidem tartrate]; Lipitor [atorvastatin calcium]; Ranitidine; Simvastatin; Xanax xr [alprazolam er]; Cholestatin; and Clopidogrel bisulfate  Family History  Problem Relation Age of Onset  . Stroke Mother   . Diabetes Father   . Colon cancer Neg Hx     Social History Social History  Substance Use Topics  . Smoking status: Former Smoker    Packs/day: 1.00    Years: 20.00    Types: Cigarettes    Start date: 07/04/1950    Quit date: 04/25/1992  . Smokeless tobacco: Current User    Types: Chew  . Alcohol use No    Review of Systems  Level 5 caveat: Respiratory Distress and confusion.  ____________________________________________   PHYSICAL EXAM:  VITAL SIGNS: Vitals:   02/21/17 2330 02/21/17 2333  BP: (!) 117/98 (!) 117/98  Pulse:  (!) 131  Resp: 20 20  Temp:      Constitutional: Alert but confused. Breathing rapidly and in some distress.  Eyes: Conjunctivae are normal. Head: Atraumatic. Nose: No congestion/rhinnorhea. Mouth/Throat: Mucous membranes are moist.  Neck:  No stridor. Cardiovascular: Normal rate, regular rhythm. Good peripheral circulation. Grossly normal heart sounds.   Respiratory: Increased respiratory effort.  No retractions. Lungs with faint crackles at the bases.  Gastrointestinal: Soft and nontender. No distention.  Musculoskeletal: No lower extremity tenderness nor edema. No gross deformities of extremities. Neurologic:  Normal speech and language. No gross focal neurologic deficits are appreciated. Normal CN exam 2-12. Skin:  Skin is warm, dry and intact. No rash noted. ____________________________________________   LABS (all labs ordered are listed, but only abnormal results are displayed)  Labs Reviewed  COMPREHENSIVE METABOLIC PANEL - Abnormal; Notable for the following:       Result Value   Sodium 130 (*)    CO2 13  (*)    Glucose, Bld 161 (*)    BUN 26 (*)    Creatinine, Ser 1.58 (*)    Total Bilirubin 1.3 (*)    GFR calc non Af Amer 39 (*)    GFR calc Af Amer 45 (*)    All other components within normal limits  CBC WITH DIFFERENTIAL/PLATELET - Abnormal; Notable for the following:    WBC 11.4 (*)    MCHC 36.6 (*)    Neutro Abs 9.2 (*)    All other components within normal limits  URINALYSIS, ROUTINE W REFLEX MICROSCOPIC - Abnormal; Notable for the following:    Ketones, ur 5 (*)    All other components within normal limits  BLOOD GAS, ARTERIAL - Abnormal; Notable for the following:    pO2, Arterial 133 (*)    All other components within normal limits  PROTIME-INR - Abnormal; Notable for the following:    Prothrombin Time 22.1 (*)    All other components within normal limits  LACTIC ACID, PLASMA - Abnormal; Notable for the following:    Lactic Acid, Venous 3.5 (*)    All other components within normal limits  I-STAT CG4 LACTIC ACID, ED - Abnormal; Notable for the following:    Lactic Acid, Venous 4.46 (*)    All other components within normal limits  CULTURE, BLOOD (ROUTINE X 2)  CULTURE, BLOOD (ROUTINE X 2)  BRAIN NATRIURETIC PEPTIDE  INFLUENZA PANEL BY PCR (TYPE A & B)  I-STAT TROPOININ, ED   ____________________________________________  EKG   EKG Interpretation  Date/Time:  Tuesday February 21 2017 17:26:51 EDT Ventricular Rate:  89 PR Interval:    QRS Duration: 134 QT Interval:  401 QTC Calculation: 488 R Axis:   -41 Text Interpretation:  Atrial fibrillation Left bundle branch block Baseline wander in lead(s) I II aVR Similar to prior. No STEMI.  Confirmed by LONG MD, JOSHUA [phone]) on 02/21/2017 5:37:33 PM       ____________________________________________  RADIOLOGY  Ct Angio Chest Pe W And/or Wo Contrast  Result Date: 02/21/2017 CLINICAL DATA:  Dyspnea without chest pain EXAM: CT ANGIOGRAPHY CHEST WITH CONTRAST TECHNIQUE: Multidetector CT imaging of the chest was  performed using the standard protocol during bolus administration of intravenous contrast. Multiplanar CT image reconstructions and MIPs were obtained to evaluate the vascular anatomy. CONTRAST:  100 cc Isovue 370 IV COMPARISON:  06/15/2016, CXR 02/21/2017 FINDINGS: Cardiovascular: Top normal size cardiac chambers. No pericardial effusion. Aortic valvular repair. Three-vessel coronary arteriosclerosis. Aortic atherosclerosis without aneurysm. No large central pulmonary embolus. Mediastinum/Nodes: No enlarged mediastinal, hilar, or axillary lymph nodes. Thyroid gland, trachea, and esophagus demonstrate no significant findings. Lungs/Pleura: Respiratory motion artifacts slightly limit assessment. Small bilateral pleural effusions with central vascular congestion consistent pulmonary edema. Subpleural blebs are  noted in the apices with areas of centrilobular emphysema more so within the right upper lobe. Upper Abdomen: No acute abnormality. Musculoskeletal: No chest wall abnormality. No acute or significant osseous findings. Review of the MIP images confirms the above findings. IMPRESSION: 1. No acute central pulmonary embolus. Three-vessel coronary arteriosclerosis with aortic atherosclerosis. Status post aortic valve repair. 2. Findings suggest mild pulmonary edema with small bilateral pleural effusions. Mild emphysematous change in the upper lobes. Electronically Signed   By: Ashley Royalty M.D.   On: 02/21/2017 22:17   Dg Chest Portable 1 View  Result Date: 02/21/2017 CLINICAL DATA:  Shortness of breath. EXAM: PORTABLE CHEST 1 VIEW COMPARISON:  02/09/2017 FINDINGS: Sequelae of transcatheter aortic valve replacement are again identified. The cardiac silhouette is mildly enlarged. The lungs are slightly less well inflated than on the prior study with a similar appearance of mild peribronchial thickening. There is minimal atelectasis in the lung bases. No pleural effusion or pneumothorax is identified. No acute  osseous abnormality is seen. IMPRESSION: Mild bronchitic changes.  Minimal bibasilar atelectasis. Electronically Signed   By: Logan Bores M.D.   On: 02/21/2017 18:22    ____________________________________________   PROCEDURES  Procedure(s) performed:   Procedures  CRITICAL CARE Performed by: Margette Fast Total critical care time: 60 minutes Critical care time was exclusive of separately billable procedures and treating other patients. Critical care was necessary to treat or prevent imminent or life-threatening deterioration. Critical care was time spent personally by me on the following activities: development of treatment plan with patient and/or surrogate as well as nursing, discussions with consultants, evaluation of patient's response to treatment, examination of patient, obtaining history from patient or surrogate, ordering and performing treatments and interventions, ordering and review of laboratory studies, ordering and review of radiographic studies, pulse oximetry and re-evaluation of patient's condition.  Nanda Quinton, MD Emergency Medicine  ____________________________________________   INITIAL IMPRESSION / ASSESSMENT AND PLAN / ED COURSE  Pertinent labs & imaging results that were available during my care of the patient were reviewed by me and considered in my medical decision making (see chart for details).  She presents emergency department for evaluation of confusion and hypoxemia in the setting of sudden onset of difficulty breathing. Focal neurological findings to suggest acute stroke. The patient is breathing rapidly and hypoxemic. He has some notable confusion which is different from his baseline per family at bedside. Denies chest pain. Sam is consistent with respiratory distress and pain crackles at the bases. I have her portable chest x-ray, ABG, lab work, reassess. Plan for DuoNeb. Also test for flu. She seems similar to prior tracings.  06:15 PM Patient  with significantly elevated lactate with concern for sepsis. Started sepsis bundle with abx for unknown infectious process. Suspect flu vs PNA with hypoxemia and primarily respiratory symptoms. Will give 30 ml/kg fluid bolus with no evidence of volume overload on exam. Updated patient and family at bedside.   07:02 PM Patient with some rigors now but no fever. Plan for in and out cath for UA.   10:30 PM No acute PE on CTA. No PNA. Some mild edema noted. Patient is more calm. Remains afebrile. Lactate is down-trending.   Discussed patient's case with hospitalist, Dr. Darrick Meigs. Patient and family (if present) updated with plan. Care transferred to hospitalist service.  I reviewed all nursing notes, vitals, pertinent old records, EKGs, labs, imaging (as available).  ____________________________________________  FINAL CLINICAL IMPRESSION(S) / ED DIAGNOSES  Final diagnoses:  Hypoxia  Acute  pulmonary edema (HCC)  Confusion     MEDICATIONS GIVEN DURING THIS VISIT:  Medications  piperacillin-tazobactam (ZOSYN) IVPB 3.375 g (not administered)  ipratropium-albuterol (DUONEB) 0.5-2.5 (3) MG/3ML nebulizer solution 3 mL (3 mLs Nebulization Given 02/21/17 1753)  sodium chloride 0.9 % bolus 1,000 mL (1,000 mLs Intravenous New Bag/Given 02/21/17 2200)    And  sodium chloride 0.9 % bolus 1,000 mL (0 mLs Intravenous Stopped 02/21/17 2200)    And  sodium chloride 0.9 % bolus 1,000 mL (0 mLs Intravenous Stopped 02/21/17 2200)  piperacillin-tazobactam (ZOSYN) IVPB 3.375 g (0 g Intravenous Stopped 02/21/17 1938)  vancomycin (VANCOCIN) IVPB 1000 mg/200 mL premix (0 mg Intravenous Stopped 02/21/17 2051)  oseltamivir (TAMIFLU) capsule 75 mg (75 mg Oral Given 02/21/17 1915)  vancomycin (VANCOCIN) IVPB 1000 mg/200 mL premix (0 mg Intravenous Stopped 02/21/17 2137)  acetaminophen (TYLENOL) tablet 650 mg (650 mg Oral Given 02/21/17 2018)  LORazepam (ATIVAN) injection 0.5 mg (0.5 mg Intravenous Given 02/21/17 1938)    haloperidol lactate (HALDOL) injection 2 mg (2 mg Intravenous Given 02/21/17 2019)  iopamidol (ISOVUE-370) 76 % injection 100 mL (100 mLs Intravenous Contrast Given 02/21/17 2144)  haloperidol lactate (HALDOL) injection 2 mg (2 mg Intravenous Given 02/21/17 2124)  LORazepam (ATIVAN) injection 1 mg (1 mg Intravenous Given 02/21/17 2250)     NEW OUTPATIENT MEDICATIONS STARTED DURING THIS VISIT:  None   Note:  This document was prepared using Dragon voice recognition software and may include unintentional dictation errors.  Nanda Quinton, MD Emergency Medicine   Margette Fast, MD 02/21/17 2352

## 2017-02-21 NOTE — H&P (Signed)
TRH H&P    Patient Demographics:    [name] [name], is a 83 y.o. male  MRN: [id number]  DOB - November 01, 1932  Admit Date - 02/21/2017  Referring MD/NP/PA: Dr Laverta Baltimore  Outpatient Primary MD for the patient is Sallee Lange, MD  Patient coming from: Home  Chief Complaint  Patient presents with  . Respiratory Distress      HPI:    [name] [name]  is a 83 y.o. male, With history of aortic stenosis status post TAVR, systolic CHF, atrial fibrillation, hypertension, diabetes mellitus who was recently discharged from the hospital on 02/09/2017 after he was treated for CHF exacerbation. Today patient was brought to the hospital for dyspnea and confusion. Patient is at home with his wife and his cat by CRNA. Patient went to take shower and when he returned seen and noted significant dyspnea and cyanosis in the fingers. Patient had difficulty breathing and also became confused. EMS was called and patient was brought to the ED. In the ED patient was confused and was given Haldol 2 mg, at this time he is somnolent and unable to provide any history. Lactic acid of 4.46 initially which improved to 3.5. BNP 55.0. Influenza A and B PCR were negative, CT angiogram chest was done which showed mild pulmonary edema and no pulmonary embolism. Patient empirically started on vancomycin and Zosyn for sepsis.  There has been no reported history of vomiting or diarrhea. No fever.   Review of systems:     Review of systems is unobtainable as patient is confused and somnolent  With Past History of the following :    Past Medical History:  Diagnosis Date  . Aortic stenosis    a. mod-sev by echo 05/2016.  . Asthma   . Cerebrovascular disease 07/2010   TIA; carotid ultrasound in 07/2010-significant bilateral plaque without focal internal carotid artery stenosis; MRI -encephalomalacia left temporal and right temporal lobes; small inferior right  cerebellar infarct; small vessel disease  . Chronic atrial fibrillation (HCC)    Paroxysmal; Echocardiogram in 2007-normal EF; mild LVH; left atrial enlargement; mild stenosis and minimal AI; negative stress nuclear study in 2008  . Chronic systolic CHF (congestive heart failure) ([place])    a. dx 05/2016 - EF 35-40%, diffuse HK, mod-severe AS, mod gradient, severe AVA VTI likely due to decreased cardiac output in setting of systolic dysfsunction and significant mitral regurgitation, mild MR, mod-severe MR, severe LAE, mild-mod RV dilation, mild RAE, mild-mod TR, mod PASP 53mmHg.  . CKD (chronic kidney disease), stage II   . Degenerative joint disease    feet and legs  . Diabetes mellitus    no insulin; A1c of 6.6 in 2005  . Dizziness    occurs daily,especially in am  . Exertional dyspnea   . Gastroesophageal reflux disease   . Hepatic steatosis   . History of noncompliance with medical treatment   . Hyperlipidemia    adverse reactions to statins and niacin  . Hypertension    Borderline  . Irregular heartbeat   . Mitral regurgitation  a. mod-sev by echo 05/2016  . Peripheral vascular disease ([place])   . Renal insufficiency   . S/P TAVR (transcatheter aortic valve replacement) 07/19/2016   29 mm Edwards Sapien 3 transcatheter heart valve placed via left percutaneous transfemoral approach  . Temporal arteritis ([place])   . Tricuspid regurgitation    a. mild-mod TR by echo 05/2016      Past Surgical History:  Procedure Laterality Date  . COLONOSCOPY  2002  . COLONOSCOPY  01/19/2012   Procedure: COLONOSCOPY;  Surgeon: Rogene Houston, MD;  [facility];  Service: Endoscopy;  Laterality: N/A;  1030  . LIPOMA EXCISION  1980  . ORIF ANKLE FRACTURE  2000   Right  . PERIPHERAL VASCULAR CATHETERIZATION N/A 07/11/2016   Procedure: Carotid PTA/Stent Intervention;  Surgeon: Lorretta Harp, MD;  Location: [facility];  Service: Cardiovascular;  Laterality: N/A;  . PROSTATE SURGERY   12/2011  . ROTATOR CUFF REPAIR     Right  . TEE WITHOUT CARDIOVERSION N/A 06/17/2016   Procedure: TRANSESOPHAGEAL ECHOCARDIOGRAM (TEE);  Surgeon: Jerline Pain, MD;  Location: [place];  Service: Cardiovascular;  Laterality: N/A;  . TEE WITHOUT CARDIOVERSION N/A 07/19/2016   Procedure: TRANSESOPHAGEAL ECHOCARDIOGRAM (TEE);  Surgeon: Sherren Mocha, MD;  Location: [place];  Service: Open Heart Surgery;  Laterality: N/A;  . TRANSCATHETER AORTIC VALVE REPLACEMENT, TRANSFEMORAL N/A 07/19/2016   Procedure: TRANSCATHETER AORTIC VALVE REPLACEMENT, TRANSFEMORAL;  Surgeon: Sherren Mocha, MD;  Location: [place];  Service: Open Heart Surgery;  Laterality: N/A;  . TRANSURETHRAL RESECTION OF PROSTATE  09/2011  . URETHRAL STRICTURE DILATATION  1980s      Social History:      Social History  Substance Use Topics  . Smoking status: Former Smoker    Packs/day: 1.00    Years: 20.00    Types: Cigarettes    Start date: 07/04/1950    Quit date: 04/25/1992  . Smokeless tobacco: Current User    Types: Chew  . Alcohol use No       Family History :     Family History  Problem Relation Age of Onset  . Stroke Mother   . Diabetes Father   . Colon cancer Neg Hx       Home Medications:   Prior to Admission medications   Medication Sig Start Date End Date Taking? Authorizing Provider  bisoprolol (ZEBETA) 5 MG tablet TAKE (1/2) TABLET BY MOUTH DAILY. 12/16/16  Yes Kathyrn Drown, MD  furosemide (LASIX) 20 MG tablet Take 1 tablet (20 mg total) by mouth daily. 09/23/16  Yes Amy D Clegg, NP  losartan (COZAAR) 25 MG tablet TAKE (1/2) TABLET BY MOUTH DAILY. 12/16/16  Yes Kathyrn Drown, MD  nortriptyline (PAMELOR) 10 MG capsule TAKE 1 TO 2 CAPSULES AT BEDTIME FOR BURNING IN FEET. 12/16/16  Yes Kathyrn Drown, MD  rosuvastatin (CRESTOR) 5 MG tablet TAKE (1) TABLET BY MOUTH ONCE DAILY. 12/16/16  Yes Kathyrn Drown, MD  sitaGLIPtin (JANUVIA) 50 MG tablet Take 1 tablet (50 mg total) by mouth daily. 02/09/17  Yes Orson Eva, MD  Sodium Phosphates (FLEET ENEMA RE) Place 1 Dose rectally daily as needed (constipation). Via rectum prn daily for constipation    Yes Historical Provider, MD  spironolactone (ALDACTONE) 25 MG tablet TAKE (1) TABLET DAILY IN THE MORNING. 12/16/16  Yes Kathyrn Drown, MD  traZODone (DESYREL) 100 MG tablet TAKE 1 TABLET BY MOUTH AT BEDTIME. 12/16/16  Yes Kathyrn Drown, MD  warfarin (COUMADIN) 6 MG tablet TAKE 1 TABLET AS DIRECTED. Patient taking differently: TAKE 6MG  ON SUNDAYS, MONDAYS, WEDNESDAYS, AND FRIDAYS. TAKE 3MG ON TUESDAYS, THURSDAYS, AND SATURDAYS 12/16/16  Yes Scott A Luking, MD     Allergies:     Allergies  Allergen Reactions  . Ambien [Zolpidem Tartrate] Other (See Comments)    Sleep walks  . Lipitor [Atorvastatin Calcium] Other (See Comments)    myalgias  . Ranitidine Other (See Comments)    Chest discomfort  . Simvastatin Other (See Comments)    Myalgias  . Xanax Xr [Alprazolam Er] Other (See Comments)    Tightness in chest  . Cholestatin Other (See Comments)    UNSPECIFIED REACTION   . Clopidogrel Bisulfate Rash     Physical Exam:   Vitals  Blood pressure 127/90, pulse (!) 131, temperature 98.4 F (36.9 C), temperature source Rectal, resp. rate 20, height 6\' 1" (1.854 m), weight 97.1 kg (214 lb), SpO2 100 %.  1.  General: Somnolent after given Haldol  2. Psychiatric: Not tested  3. Neurologic: Not tested  4. Eyes :  anicteric sclerae, moist conjunctivae   5. ENMT:   moist mucous membranes a  6. Neck:  supple, no cervical lymphadenopathy appriciated, No thyromegaly  7. Respiratory : Normal respiratory effort, good air movement bilaterally,clear to  auscultation bilaterally  8. Cardiovascular : RRR, no gallops, rubs or murmurs, no leg edema  9. Gastrointestinal:  Positive bowel sounds, abdomen soft, non-tender to palpation,no hepatosplenomegaly, no rigidity or guarding       10 . Skin:  No cyanosis, normal texture and turgor, no rash,  lesions or ulcers  11.Musculoskeletal:  Good muscle tone,  joints appear normal , no effusions,  normal range of motion    Data Review:    CBC  Recent Labs Lab 02/21/17 1747  WBC 11.4*  HGB 15.9  HCT 43.4  PLT 194  MCV 84.9  MCH 31.1  MCHC 36.6*  RDW 13.4  LYMPHSABS 1.2  MONOABS 0.8  EOSABS 0.2  BASOSABS 0.0   ------------------------------------------------------------------------------------------------------------------  Chemistries   Recent Labs Lab 02/21/17 1747  NA 130*  K 4.0  CL 103  CO2 13*  GLUCOSE 161*  BUN 26*  CREATININE 1.58*  CALCIUM 9.7  AST 26  ALT 23  ALKPHOS 88  BILITOT 1.3*   ------------------------------------------------------------------------------------------------------------------  ------------------------------------------------------------------------------------------------------------------ GFR: Estimated Creatinine Clearance: 44.3 mL/min (A) (by C-G formula based on SCr of 1.58 mg/dL (H)). Liver Function Tests:  Recent Labs Lab 02/21/17 1747  AST 26  ALT 23  ALKPHOS 88  BILITOT 1.3*  PROT 7.8  ALBUMIN 4.1   No results for input(s): LIPASE, AMYLASE in the last 168 hours. No results for input(s): AMMONIA in the last 168 hours. Coagulation Profile:  Recent Labs Lab 02/21/17 1752  INR 1.90    --------------------------------------------------------------------------------------------------------------- Urine analysis:    Component Value Date/Time   COLORURINE YELLOW 02/21/2017 1747   APPEARANCEUR CLEAR 02/21/2017 1747   LABSPEC 1.014 02/21/2017 1747   PHURINE 7.0 02/21/2017 1747   GLUCOSEU NEGATIVE 02/21/2017 1747   HGBUR NEGATIVE 02/21/2017 1747   BILIRUBINUR NEGATIVE 02/21/2017 1747   BILIRUBINUR + 06/23/2014 1707   KETONESUR 5 (A) 02/21/2017 1747   PROTEINUR NEGATIVE 02/21/2017 1747   UROBILINOGEN 0.2 08/20/2015 2030   NITRITE NEGATIVE 02/21/2017 1747   LEUKOCYTESUR NEGATIVE 02/21/2017 1747       Imaging Results:    Ct Angio Chest Pe W And/or Wo Contrast  Result Date: 02/21/2017 CLINICAL DATA:  Dyspnea without chest  pain EXAM: CT ANGIOGRAPHY CHEST WITH CONTRAST TECHNIQUE: Multidetector CT imaging of the chest was performed using the standard protocol during bolus administration of intravenous contrast. Multiplanar CT image reconstructions and MIPs were obtained to evaluate the vascular anatomy. CONTRAST:  100 cc Isovue 370 IV COMPARISON:  06/15/2016, CXR 02/21/2017 FINDINGS: Cardiovascular: Top normal size cardiac chambers. No pericardial effusion. Aortic valvular repair. Three-vessel coronary arteriosclerosis. Aortic atherosclerosis without aneurysm. No large central pulmonary embolus. Mediastinum/Nodes: No enlarged mediastinal, hilar, or axillary lymph nodes. Thyroid gland, trachea, and esophagus demonstrate no significant findings. Lungs/Pleura: Respiratory motion artifacts slightly limit assessment. Small bilateral pleural effusions with central vascular congestion consistent pulmonary edema. Subpleural blebs are noted in the apices with areas of centrilobular emphysema more so within the right upper lobe. Upper Abdomen: No acute abnormality. Musculoskeletal: No chest wall abnormality. No acute or significant osseous findings. Review of the MIP images confirms the above findings. IMPRESSION: 1. No acute central pulmonary embolus. Three-vessel coronary arteriosclerosis with aortic atherosclerosis. Status post aortic valve repair. 2. Findings suggest mild pulmonary edema with small bilateral pleural effusions. Mild emphysematous change in the upper lobes. Electronically Signed   By: Ashley Royalty M.D.   On: 02/21/2017 22:17   Dg Chest Portable 1 View  Result Date: 02/21/2017 CLINICAL DATA:  Shortness of breath. EXAM: PORTABLE CHEST 1 VIEW COMPARISON:  02/09/2017 FINDINGS: Sequelae of transcatheter aortic valve replacement are again identified. The cardiac silhouette is mildly enlarged. The  lungs are slightly less well inflated than on the prior study with a similar appearance of mild peribronchial thickening. There is minimal atelectasis in the lung bases. No pleural effusion or pneumothorax is identified. No acute osseous abnormality is seen. IMPRESSION: Mild bronchitic changes.  Minimal bibasilar atelectasis. Electronically Signed   By: Logan Bores M.D.   On: 02/21/2017 18:22    EKG- atrial fibrillation with RVR   Assessment & Plan:    Active Problems:   Hypertension   Atrial fibrillation (HCC)   Diabetes mellitus ([place])   CAD (coronary artery disease), native coronary artery   S/P TAVR (transcatheter aortic valve replacement)   Acute on chronic systolic CHF (congestive heart failure) (HCC)   Altered mental status   1. Altered mental status- likely from hypoxia as patient's O2 sats was in 50s at home. CNA used patient's wife's oxygen for him, currently patient is somnolent after he received Haldol for agitation. 2. ? Sepsis- no clear source of infection, UA was clear. Chest x-ray showed no pneumonia. But lactate elevated which also could be due to hypoxia, will check lactate every 3 hours. Patient empirically started on vancomycin and Zosyn, if no clear source of infection is found consider stopping the antibiotics 3. Acute hypoxic respiratory failure- patient developed significant distal exertion and became cyanotic. He does have a history of CHF. CT angio gram showed mild pulmonary edema, no pulmonary embolism. We'll start Lasix 20 daily grams IV every 12 hours. 4. Acute on chronic systolic CHF-patient started on IV Lasix 20 daily grams IV every 12 hours as above. Follow strict intake and output. 5. Atrial fibrillation with RVR- patient currently in A. fib with RVR, continue Coumadin per pharmacy consultation. Will start Cardizem infusion. 6. Diabetes mellitus- patient currently nothing by mouth, will start sliding scale insulin with NovoLog. 7. CAD- stable, continue  bisoprolol, Crestor. 8. Hypertension- hold Cozaar. Patient started on IV Lasix. We'll continue Aldactone 25 mg daily.    DVT Prophylaxis-   Patient on Coumadin  AM Labs Ordered, also please review  Full Orders  Family Communication: Admission, patients condition and plan of care including tests being ordered have been discussed with the patient's son at bedside who indicate understanding and agree with the plan and Code Status.  Code Status:  Full code  Admission status: Observation    Time spent in minutes : 60 minutes   Lois Slagel S M.D on 02/21/2017 at 11:27 PM  Between 7am to 7pm - Pager - [phone]. After 7pm go to [email or web address] - password [facility]  [facility] - Office  [phone]

## 2017-02-21 NOTE — ED Triage Notes (Signed)
Patient c/o shortness of breath. Labored breathing noted. Patient denies any chest pain. Per family patient became short of breath approx 2 hours ago, O2 sat checked by CNA at home and patient was disoriented. Per family O2 sat in 95s and wife's oxygen mask was placed on patient. Family states patient has had cardiac stent in past and had mitral valve replacement 1 year ago.

## 2017-02-22 ENCOUNTER — Encounter (HOSPITAL_COMMUNITY): Payer: Self-pay | Admitting: *Deleted

## 2017-02-22 DIAGNOSIS — R4182 Altered mental status, unspecified: Secondary | ICD-10-CM | POA: Diagnosis not present

## 2017-02-22 DIAGNOSIS — Z952 Presence of prosthetic heart valve: Secondary | ICD-10-CM | POA: Diagnosis not present

## 2017-02-22 DIAGNOSIS — I5023 Acute on chronic systolic (congestive) heart failure: Secondary | ICD-10-CM | POA: Diagnosis not present

## 2017-02-22 DIAGNOSIS — Z823 Family history of stroke: Secondary | ICD-10-CM | POA: Diagnosis not present

## 2017-02-22 DIAGNOSIS — I5043 Acute on chronic combined systolic (congestive) and diastolic (congestive) heart failure: Secondary | ICD-10-CM | POA: Diagnosis not present

## 2017-02-22 DIAGNOSIS — G9341 Metabolic encephalopathy: Secondary | ICD-10-CM | POA: Diagnosis not present

## 2017-02-22 DIAGNOSIS — Z833 Family history of diabetes mellitus: Secondary | ICD-10-CM | POA: Diagnosis not present

## 2017-02-22 DIAGNOSIS — A419 Sepsis, unspecified organism: Secondary | ICD-10-CM | POA: Diagnosis not present

## 2017-02-22 DIAGNOSIS — I2510 Atherosclerotic heart disease of native coronary artery without angina pectoris: Secondary | ICD-10-CM | POA: Diagnosis not present

## 2017-02-22 DIAGNOSIS — I482 Chronic atrial fibrillation: Secondary | ICD-10-CM | POA: Diagnosis not present

## 2017-02-22 DIAGNOSIS — E785 Hyperlipidemia, unspecified: Secondary | ICD-10-CM | POA: Diagnosis not present

## 2017-02-22 DIAGNOSIS — I10 Essential (primary) hypertension: Secondary | ICD-10-CM | POA: Diagnosis not present

## 2017-02-22 DIAGNOSIS — D72829 Elevated white blood cell count, unspecified: Secondary | ICD-10-CM

## 2017-02-22 DIAGNOSIS — R001 Bradycardia, unspecified: Secondary | ICD-10-CM | POA: Diagnosis not present

## 2017-02-22 DIAGNOSIS — Z8673 Personal history of transient ischemic attack (TIA), and cerebral infarction without residual deficits: Secondary | ICD-10-CM | POA: Diagnosis not present

## 2017-02-22 DIAGNOSIS — I4891 Unspecified atrial fibrillation: Secondary | ICD-10-CM | POA: Diagnosis not present

## 2017-02-22 DIAGNOSIS — Z888 Allergy status to other drugs, medicaments and biological substances status: Secondary | ICD-10-CM | POA: Diagnosis not present

## 2017-02-22 DIAGNOSIS — E860 Dehydration: Secondary | ICD-10-CM | POA: Diagnosis not present

## 2017-02-22 DIAGNOSIS — R7989 Other specified abnormal findings of blood chemistry: Secondary | ICD-10-CM

## 2017-02-22 DIAGNOSIS — J45909 Unspecified asthma, uncomplicated: Secondary | ICD-10-CM | POA: Diagnosis not present

## 2017-02-22 DIAGNOSIS — I25119 Atherosclerotic heart disease of native coronary artery with unspecified angina pectoris: Secondary | ICD-10-CM | POA: Diagnosis not present

## 2017-02-22 DIAGNOSIS — R0902 Hypoxemia: Secondary | ICD-10-CM | POA: Diagnosis not present

## 2017-02-22 DIAGNOSIS — N182 Chronic kidney disease, stage 2 (mild): Secondary | ICD-10-CM | POA: Diagnosis not present

## 2017-02-22 DIAGNOSIS — Z7901 Long term (current) use of anticoagulants: Secondary | ICD-10-CM | POA: Diagnosis not present

## 2017-02-22 DIAGNOSIS — I447 Left bundle-branch block, unspecified: Secondary | ICD-10-CM | POA: Diagnosis not present

## 2017-02-22 DIAGNOSIS — E1122 Type 2 diabetes mellitus with diabetic chronic kidney disease: Secondary | ICD-10-CM | POA: Diagnosis not present

## 2017-02-22 DIAGNOSIS — F17220 Nicotine dependence, chewing tobacco, uncomplicated: Secondary | ICD-10-CM | POA: Diagnosis not present

## 2017-02-22 DIAGNOSIS — R410 Disorientation, unspecified: Secondary | ICD-10-CM | POA: Diagnosis not present

## 2017-02-22 DIAGNOSIS — N179 Acute kidney failure, unspecified: Secondary | ICD-10-CM | POA: Diagnosis not present

## 2017-02-22 DIAGNOSIS — I350 Nonrheumatic aortic (valve) stenosis: Secondary | ICD-10-CM | POA: Diagnosis not present

## 2017-02-22 DIAGNOSIS — E1142 Type 2 diabetes mellitus with diabetic polyneuropathy: Secondary | ICD-10-CM | POA: Diagnosis not present

## 2017-02-22 DIAGNOSIS — E1151 Type 2 diabetes mellitus with diabetic peripheral angiopathy without gangrene: Secondary | ICD-10-CM | POA: Diagnosis not present

## 2017-02-22 DIAGNOSIS — I130 Hypertensive heart and chronic kidney disease with heart failure and stage 1 through stage 4 chronic kidney disease, or unspecified chronic kidney disease: Secondary | ICD-10-CM | POA: Diagnosis not present

## 2017-02-22 DIAGNOSIS — J9601 Acute respiratory failure with hypoxia: Secondary | ICD-10-CM | POA: Diagnosis not present

## 2017-02-22 LAB — LACTIC ACID, PLASMA
LACTIC ACID, VENOUS: 4.5 mmol/L — AB (ref 0.5–1.9)
LACTIC ACID, VENOUS: 4.1 mmol/L — AB (ref 0.5–1.9)
Lactic Acid, Venous: 1.8 mmol/L (ref 0.5–1.9)

## 2017-02-22 LAB — COMPREHENSIVE METABOLIC PANEL
ALK PHOS: 78 U/L (ref 38–126)
ALT: 20 U/L (ref 17–63)
AST: 32 U/L (ref 15–41)
Albumin: 3.7 g/dL (ref 3.5–5.0)
Anion gap: 12 (ref 5–15)
BUN: 24 mg/dL — AB (ref 6–20)
CO2: 21 mmol/L — AB (ref 22–32)
CREATININE: 1.65 mg/dL — AB (ref 0.61–1.24)
Calcium: 8.3 mg/dL — ABNORMAL LOW (ref 8.9–10.3)
Chloride: 97 mmol/L — ABNORMAL LOW (ref 101–111)
GFR, EST AFRICAN AMERICAN: 43 mL/min — AB (ref >60)
GFR, EST NON AFRICAN AMERICAN: 37 mL/min — AB (ref >60)
Glucose, Bld: 170 mg/dL — ABNORMAL HIGH (ref 65–99)
Potassium: 4.2 mmol/L (ref 3.5–5.1)
Sodium: 130 mmol/L — ABNORMAL LOW (ref 135–145)
Total Bilirubin: 1.6 mg/dL — ABNORMAL HIGH (ref 0.3–1.2)
Total Protein: 7 g/dL (ref 6.5–8.1)

## 2017-02-22 LAB — BLOOD GAS, ARTERIAL
Acid-base deficit: 5 mmol/L — ABNORMAL HIGH (ref 0.0–2.0)
Bicarbonate: 20.7 mmol/L (ref 20.0–28.0)
DRAWN BY: 105551
FIO2: 32
O2 Saturation: 95.2 %
PCO2 ART: 33.6 mmHg (ref 32.0–48.0)
pH, Arterial: 7.377 (ref 7.350–7.450)
pO2, Arterial: 87.9 mmHg (ref 83.0–108.0)

## 2017-02-22 LAB — CBC
HCT: 41.7 % (ref 39.0–52.0)
HEMOGLOBIN: 14.8 g/dL (ref 13.0–17.0)
MCH: 31.4 pg (ref 26.0–34.0)
MCHC: 35.5 g/dL (ref 30.0–36.0)
MCV: 88.5 fL (ref 78.0–100.0)
Platelets: 169 10*3/uL (ref 150–400)
RBC: 4.71 MIL/uL (ref 4.22–5.81)
RDW: 13.6 % (ref 11.5–15.5)
WBC: 13 10*3/uL — AB (ref 4.0–10.5)

## 2017-02-22 LAB — GLUCOSE, CAPILLARY
GLUCOSE-CAPILLARY: 144 mg/dL — AB (ref 65–99)
GLUCOSE-CAPILLARY: 140 mg/dL — AB (ref 65–99)
GLUCOSE-CAPILLARY: 123 mg/dL — AB (ref 65–99)
Glucose-Capillary: 116 mg/dL — ABNORMAL HIGH (ref 65–99)
Glucose-Capillary: 140 mg/dL — ABNORMAL HIGH (ref 65–99)

## 2017-02-22 LAB — MRSA PCR SCREENING: MRSA by PCR: POSITIVE — AB

## 2017-02-22 MED ORDER — FUROSEMIDE 10 MG/ML IJ SOLN
20.0000 mg | Freq: Two times a day (BID) | INTRAMUSCULAR | Status: DC
  Administered 2017-02-22 – 2017-02-23 (×2): 20 mg via INTRAVENOUS
  Filled 2017-02-22 (×2): qty 2

## 2017-02-22 MED ORDER — SODIUM CHLORIDE 0.9 % IV SOLN
INTRAVENOUS | Status: DC
  Administered 2017-02-22: 11:00:00 via INTRAVENOUS

## 2017-02-22 MED ORDER — HALOPERIDOL LACTATE 5 MG/ML IJ SOLN
2.0000 mg | Freq: Four times a day (QID) | INTRAMUSCULAR | Status: DC | PRN
  Administered 2017-02-22: 2 mg via INTRAVENOUS
  Filled 2017-02-22: qty 1

## 2017-02-22 MED ORDER — ACETAMINOPHEN 325 MG PO TABS
650.0000 mg | ORAL_TABLET | Freq: Four times a day (QID) | ORAL | Status: DC | PRN
Start: 2017-02-22 — End: 2017-02-24

## 2017-02-22 MED ORDER — DILTIAZEM HCL-DEXTROSE 100-5 MG/100ML-% IV SOLN (PREMIX)
5.0000 mg/h | INTRAVENOUS | Status: DC
  Administered 2017-02-22: 10 mg/h via INTRAVENOUS
  Filled 2017-02-22: qty 100

## 2017-02-22 MED ORDER — SODIUM CHLORIDE 0.9% FLUSH: 3.0000 mL | INTRAVENOUS | Status: DC | PRN

## 2017-02-22 MED ORDER — LORAZEPAM 2 MG/ML IJ SOLN
1.0000 mg | Freq: Once | INTRAMUSCULAR | Status: AC
  Administered 2017-02-22: 1 mg via INTRAVENOUS
  Filled 2017-02-22: qty 1

## 2017-02-22 MED ORDER — WARFARIN SODIUM 5 MG PO TABS: 6.0000 mg | ORAL_TABLET | Freq: Once | ORAL | Status: DC

## 2017-02-22 MED ORDER — SODIUM CHLORIDE 0.9% FLUSH
3.0000 mL | Freq: Two times a day (BID) | INTRAVENOUS | Status: DC
  Administered 2017-02-22 – 2017-02-24 (×5): 3 mL via INTRAVENOUS

## 2017-02-22 MED ORDER — ONDANSETRON HCL 4 MG PO TABS: 4.0000 mg | ORAL_TABLET | Freq: Four times a day (QID) | ORAL | Status: DC | PRN

## 2017-02-22 MED ORDER — ONDANSETRON HCL 4 MG/2ML IJ SOLN
4.0000 mg | Freq: Four times a day (QID) | INTRAMUSCULAR | Status: DC | PRN
Start: 2017-02-22 — End: 2017-02-24

## 2017-02-22 MED ORDER — BISOPROLOL FUMARATE 5 MG PO TABS
2.5000 mg | ORAL_TABLET | Freq: Every day | ORAL | Status: DC
  Filled 2017-02-22 (×3): qty 0.5

## 2017-02-22 MED ORDER — ACETAMINOPHEN 650 MG RE SUPP: 650.0000 mg | Freq: Four times a day (QID) | RECTAL | Status: DC | PRN

## 2017-02-22 MED ORDER — INSULIN ASPART 100 UNIT/ML ~~LOC~~ SOLN
0.0000 [IU] | Freq: Three times a day (TID) | SUBCUTANEOUS | Status: DC
Start: 2017-02-22 — End: 2017-02-24
  Administered 2017-02-22 – 2017-02-24 (×4): 1 [IU] via SUBCUTANEOUS

## 2017-02-22 MED ORDER — SPIRONOLACTONE 25 MG PO TABS
25.0000 mg | ORAL_TABLET | Freq: Every day | ORAL | Status: DC
  Administered 2017-02-23 – 2017-02-24 (×2): 25 mg via ORAL
  Filled 2017-02-22 (×2): qty 1

## 2017-02-22 MED ORDER — WARFARIN - PHARMACIST DOSING INPATIENT
Status: DC
  Administered 2017-02-23: 16:00:00

## 2017-02-22 MED ORDER — VANCOMYCIN HCL 10 G IV SOLR
1250.0000 mg | INTRAVENOUS | Status: DC
  Administered 2017-02-22: 1250 mg via INTRAVENOUS
  Filled 2017-02-22 (×2): qty 1250

## 2017-02-22 MED ORDER — ROSUVASTATIN CALCIUM 10 MG PO TABS
5.0000 mg | ORAL_TABLET | Freq: Every day | ORAL | Status: DC
  Administered 2017-02-23 – 2017-02-24 (×2): 5 mg via ORAL
  Filled 2017-02-22 (×2): qty 1

## 2017-02-22 MED ORDER — SODIUM CHLORIDE 0.9 % IV SOLN: 250.0000 mL | INTRAVENOUS | Status: DC | PRN

## 2017-02-22 NOTE — Consult Note (Signed)
Cardiology Consultation   Patient ID: [name] [name]; [id number]; 06-13-1932   Admit date: 02/21/2017 Date of Consult: 02/22/2017  Referring MD: Dr. Karleen Hampshire Cardiologist: Dr. Kendrick Ranch Consulting Cardiologist:Dr. Bronson Ing Patient Care Team: Kathyrn Drown, MD as PCP - General (Family Medicine) Yehuda Savannah, MD (Cardiology) [facility], MD as Attending Physician Irine Seal, MD as Attending Physician (Urology)    Reason for Consultation: afib    History of Present Illness: [name] [name] is a 83 y.o. male with a hx of  with a hx of  severe aortic stenosis, chronic atrial fibrillation, hypertension, systolic HF, type II diabetes mellitus, hyperlipidemia, and cerebrovascular disease who underwent recent R CEA and TAVR (07/19/2016). He initially had presented with severe resp distress, cardiogenic shock and rapid Afib He had a long hospitalization where he was treated for low-output heart failure with IV milrinone, amiodarone and lasix. and treated for symptomatic carotid disease with carotid stenting. He ultimately was treated with TAVR Cath moderate CAD with 75% LAD, 80% distal Cfx, otherwise nonobstructive.right heart cath moderate pulm HTN PA pressure 52/23.   Echo 08/2016 EF 35-40%   Last saw Dr. Haroldine Laws 12/2016 and looked visibly short of breath but volume status was stable on exam. Bp too soft to titrate meds. BNP 146, Crt 1.3. CXR no CHF, acute bronchitis or asthma.   We saw here in consult 02/06/17 when he fell off his tractor head first and caught himself with straight arms. He had chest soreness and rib pain. Had Afib with LVVV and troponins flat 0.05.  Patient presents yesterday with dyspnea and confusion treated with haldol in ER. CTA mild pulmonary edema, BNP 55. Was in Afib with RVR treated with IV diltiazem but stopped b/c HR in 30-40's while sleeping. We've had trouble titraing meds as outpatient b/c of hypotension. Lactic acid up to 4.5 but now 1.8 after IV  fluids at 75cc/hr. Being treated for possible sepsis.Potassium 4.2, troponin negative x1. Patient currently sedated with haldol for agitation and can't give any history. RN says he responds appropriately when awake but is flailing and agitated.     Past Medical History:  Diagnosis Date  . Aortic stenosis    a. mod-sev by echo 05/2016.  . Asthma   . Cerebrovascular disease 07/2010   TIA; carotid ultrasound in 07/2010-significant bilateral plaque without focal internal carotid artery stenosis; MRI -encephalomalacia left temporal and right temporal lobes; small inferior right cerebellar infarct; small vessel disease  . Chronic atrial fibrillation (HCC)    Paroxysmal; Echocardiogram in 2007-normal EF; mild LVH; left atrial enlargement; mild stenosis and minimal AI; negative stress nuclear study in 2008  . Chronic systolic CHF (congestive heart failure) ([place])    a. dx 05/2016 - EF 35-40%, diffuse HK, mod-severe AS, mod gradient, severe AVA VTI likely due to decreased cardiac output in setting of systolic dysfsunction and significant mitral regurgitation, mild MR, mod-severe MR, severe LAE, mild-mod RV dilation, mild RAE, mild-mod TR, mod PASP 75mmHg.  . CKD (chronic kidney disease), stage II   . Degenerative joint disease    feet and legs  . Diabetes mellitus    no insulin; A1c of 6.6 in 2005  . Dizziness    occurs daily,especially in am  . Exertional dyspnea   . Gastroesophageal reflux disease   . Hepatic steatosis   . History of noncompliance with medical treatment   . Hyperlipidemia    adverse reactions to statins and niacin  . Hypertension    Borderline  . Irregular  heartbeat   . Mitral regurgitation    a. mod-sev by echo 05/2016  . Peripheral vascular disease ([place])   . Renal insufficiency   . S/P TAVR (transcatheter aortic valve replacement) 07/19/2016   29 mm Edwards Sapien 3 transcatheter heart valve placed via left percutaneous transfemoral approach  . Temporal arteritis ([place])   .  Tricuspid regurgitation    a. mild-mod TR by echo 05/2016    Past Surgical History:  Procedure Laterality Date  . COLONOSCOPY  2002  . COLONOSCOPY  01/19/2012   Procedure: COLONOSCOPY;  Surgeon: Rogene Houston, MD;  [facility];  Service: Endoscopy;  Laterality: N/A;  1030  . LIPOMA EXCISION  1980  . ORIF ANKLE FRACTURE  2000   Right  . PERIPHERAL VASCULAR CATHETERIZATION N/A 07/11/2016   Procedure: Carotid PTA/Stent Intervention;  Surgeon: Lorretta Harp, MD;  Location: [place] [facility];  Service: Cardiovascular;  Laterality: N/A;  . PROSTATE SURGERY  12/2011  . ROTATOR CUFF REPAIR     Right  . TEE WITHOUT CARDIOVERSION N/A 06/17/2016   Procedure: TRANSESOPHAGEAL ECHOCARDIOGRAM (TEE);  Surgeon: Jerline Pain, MD;  Location: [place];  Service: Cardiovascular;  Laterality: N/A;  . TEE WITHOUT CARDIOVERSION N/A 07/19/2016   Procedure: TRANSESOPHAGEAL ECHOCARDIOGRAM (TEE);  Surgeon: Sherren Mocha, MD;  Location: [place];  Service: Open Heart Surgery;  Laterality: N/A;  . TRANSCATHETER AORTIC VALVE REPLACEMENT, TRANSFEMORAL N/A 07/19/2016   Procedure: TRANSCATHETER AORTIC VALVE REPLACEMENT, TRANSFEMORAL;  Surgeon: Sherren Mocha, MD;  Location: [place];  Service: Open Heart Surgery;  Laterality: N/A;  . TRANSURETHRAL RESECTION OF PROSTATE  09/2011  . URETHRAL STRICTURE DILATATION  1980s      Home Meds: Prior to Admission medications   Medication Sig Start Date End Date Taking? Authorizing Provider  bisoprolol (ZEBETA) 5 MG tablet TAKE (1/2) TABLET BY MOUTH DAILY. 12/16/16  Yes Kathyrn Drown, MD  furosemide (LASIX) 20 MG tablet Take 1 tablet (20 mg total) by mouth daily. 09/23/16  Yes Amy D Clegg, NP  losartan (COZAAR) 25 MG tablet TAKE (1/2) TABLET BY MOUTH DAILY. 12/16/16  Yes Kathyrn Drown, MD  nortriptyline (PAMELOR) 10 MG capsule TAKE 1 TO 2 CAPSULES AT BEDTIME FOR BURNING IN FEET. 12/16/16  Yes Kathyrn Drown, MD  rosuvastatin (CRESTOR) 5 MG tablet TAKE (1) TABLET BY MOUTH  ONCE DAILY. 12/16/16  Yes Kathyrn Drown, MD  sitaGLIPtin (JANUVIA) 50 MG tablet Take 1 tablet (50 mg total) by mouth daily. 02/09/17  Yes Orson Eva, MD  Sodium Phosphates (FLEET ENEMA RE) Place 1 Dose rectally daily as needed (constipation). Via rectum prn daily for constipation    Yes Historical Provider, MD  spironolactone (ALDACTONE) 25 MG tablet TAKE (1) TABLET DAILY IN THE MORNING. 12/16/16  Yes Kathyrn Drown, MD  traZODone (DESYREL) 100 MG tablet TAKE 1 TABLET BY MOUTH AT BEDTIME. 12/16/16  Yes Kathyrn Drown, MD  warfarin (COUMADIN) 6 MG tablet TAKE 1 TABLET AS DIRECTED. Patient taking differently: TAKE 6MG  ON SUNDAYS, MONDAYS, WEDNESDAYS, AND FRIDAYS. TAKE 3MG  ON TUESDAYS, THURSDAYS, AND SATURDAYS 12/16/16  Yes Kathyrn Drown, MD    Current Medications: . bisoprolol  2.5 mg Oral Daily  . furosemide  20 mg Intravenous Q12H  . insulin aspart  0-9 Units Subcutaneous TID WC  . piperacillin-tazobactam (ZOSYN)  IV  3.375 g Intravenous Q8H  . rosuvastatin  5 mg Oral Daily  . sodium chloride flush  3 mL Intravenous Q12H  . spironolactone  25  mg Oral Daily  . vancomycin  1,250 mg Intravenous Q24H  . warfarin  6 mg Oral Once  . Warfarin - Pharmacist Dosing Inpatient   Does not apply Q24H     Allergies:    Allergies  Allergen Reactions  . Ambien [Zolpidem Tartrate] Other (See Comments)    Sleep walks  . Lipitor [Atorvastatin Calcium] Other (See Comments)    myalgias  . Ranitidine Other (See Comments)    Chest discomfort  . Simvastatin Other (See Comments)    Myalgias  . Xanax Xr [Alprazolam Er] Other (See Comments)    Tightness in chest  . Cholestatin Other (See Comments)    UNSPECIFIED REACTION   . Clopidogrel Bisulfate Rash    Social History:   The patient  reports that he quit smoking about 24 years ago. His smoking use included Cigarettes. He started smoking about 66 years ago. He has a 20.00 pack-year smoking history. His smokeless tobacco use includes Chew. He reports that he does  not drink alcohol or use drugs.    Family History:   The patient's family history includes Diabetes in his father; Stroke in his mother.   ROS:  Please see the history of present illness.  Review of Systems  Unable to perform ROS: patient unresponsive        Vital Signs: Blood pressure (!) 99/54, pulse 61, temperature 97.4 F (36.3 C), temperature source Oral, resp. rate 17, height 6' (1.829 m), weight 215 lb 2.7 oz (97.6 kg), SpO2 97 %.   PHYSICAL EXAM: General: Elderly Well nourished, well developed, in no acute distress  HEENT: normal Lymph: no adenopathy Neck: slight increase JVD Endocrine:  No thryomegaly Vascular: No carotid bruits; FA pulses 2+ bilaterally without bruits  Cardiac:  RRR; normal S1, S2; no murmur, rub, bruit, thrill, or heave Lungs:  clear to auscultation bilaterally, no wheezing, rhonchi or rales  Abd: soft, nontender, no hepatomegaly  Ext: no edema, Good distal pulses bilaterally Musculoskeletal:  No deformities, BUE and BLE strength normal and equal Skin: warm and dry  Neuro:  Sedated, can't awaken    EKG:  Afib with LBBB no acute change personally reviewed  Telemetry: afib with rates in 30's-40's while sedated and sleeping, 140's on admission-personally reviewed.  Labs: No results for input(s): CKTOTAL, CKMB, TROPONINI in the last 72 hours. Lab Results  Component Value Date   WBC 13.0 (H) 02/22/2017   HGB 14.8 02/22/2017   HCT 41.7 02/22/2017   MCV 88.5 02/22/2017   PLT 169 02/22/2017    Recent Labs Lab 02/22/17 0330  NA 130*  K 4.2  CL 97*  CO2 21*  BUN 24*  CREATININE 1.65*  CALCIUM 8.3*  PROT 7.0  BILITOT 1.6*  ALKPHOS 78  ALT 20  AST 32  GLUCOSE 170*   Lab Results  Component Value Date   CHOL 225 (H) 04/13/2016   HDL 34 (L) 04/13/2016   LDLCALC 165 (H) 04/13/2016   TRIG 128 04/13/2016   Lab Results  Component Value Date   DDIMER <0.27 06/28/2016    Radiology/Studies:  Dg Ribs Unilateral W/chest Right  Result  Date: 02/09/2017 CLINICAL DATA:  Status post fall onto right rib cage, with anterior lower rib pain. Shortness of breath. Initial encounter. EXAM: RIGHT RIBS AND CHEST - 3+ VIEW COMPARISON:  Chest radiograph performed 12/26/2016 FINDINGS: No displaced rib fractures are seen. The lungs are well-aerated. Minimal bibasilar atelectasis is noted. Peribronchial thickening is noted. There is no evidence of pleural effusion or  pneumothorax. The cardiomediastinal silhouette is within normal limits. An aortic valve replacement is noted. No acute osseous abnormalities are seen. IMPRESSION: 1. No displaced rib fracture seen. 2. Minimal bibasilar atelectasis.  Peribronchial thickening noted. Electronically Signed   By: Garald Balding M.D.   On: 02/09/2017 01:29   Ct Head Wo Contrast  Result Date: 02/09/2017 CLINICAL DATA:  83 y/o  M; status post fall. EXAM: CT HEAD WITHOUT CONTRAST TECHNIQUE: Contiguous axial images were obtained from the base of the skull through the vertex without intravenous contrast. COMPARISON:  07/02/2016 CT of the head. FINDINGS: Brain: No evidence of acute infarction, hemorrhage, hydrocephalus, extra-axial collection or mass lesion/mass effect. Stable small chronic infarcts within the right anterior temporal lobe, left lateral temporal lobe, and bilateral cerebellar hemispheres. Stable bilateral cerebellar hemisphere dystrophic calcification. Stable small lucencies in bilateral thalamus probably representing chronic lacunar infarcts. Stable mild diffuse brain parenchymal volume loss. Vascular: Moderate calcific atherosclerosis of cavernous and paraclinoid internal carotid arteries. Skull: Normal. Negative for fracture or focal lesion. Sinuses/Orbits: No acute finding. Other: Bilateral intra-ocular lens replacement. IMPRESSION: 1. No acute intracranial abnormality identified. No displaced calvarial fracture. 2. Stable chronic infarcts in bilateral temporal lobes, cerebellar hemispheres, and basal  ganglia. Electronically Signed   By: Kristine Garbe M.D.   On: 02/09/2017 02:27   Ct Angio Chest Pe W And/or Wo Contrast  Result Date: 02/21/2017 CLINICAL DATA:  Dyspnea without chest pain EXAM: CT ANGIOGRAPHY CHEST WITH CONTRAST TECHNIQUE: Multidetector CT imaging of the chest was performed using the standard protocol during bolus administration of intravenous contrast. Multiplanar CT image reconstructions and MIPs were obtained to evaluate the vascular anatomy. CONTRAST:  100 cc Isovue 370 IV COMPARISON:  06/15/2016, CXR 02/21/2017 FINDINGS: Cardiovascular: Top normal size cardiac chambers. No pericardial effusion. Aortic valvular repair. Three-vessel coronary arteriosclerosis. Aortic atherosclerosis without aneurysm. No large central pulmonary embolus. Mediastinum/Nodes: No enlarged mediastinal, hilar, or axillary lymph nodes. Thyroid gland, trachea, and esophagus demonstrate no significant findings. Lungs/Pleura: Respiratory motion artifacts slightly limit assessment. Small bilateral pleural effusions with central vascular congestion consistent pulmonary edema. Subpleural blebs are noted in the apices with areas of centrilobular emphysema more so within the right upper lobe. Upper Abdomen: No acute abnormality. Musculoskeletal: No chest wall abnormality. No acute or significant osseous findings. Review of the MIP images confirms the above findings. IMPRESSION: 1. No acute central pulmonary embolus. Three-vessel coronary arteriosclerosis with aortic atherosclerosis. Status post aortic valve repair. 2. Findings suggest mild pulmonary edema with small bilateral pleural effusions. Mild emphysematous change in the upper lobes. Electronically Signed   By: Ashley Royalty M.D.   On: 02/21/2017 22:17   Ct Abdomen Pelvis W Contrast  Result Date: 02/09/2017 CLINICAL DATA:  Fall 2 days ago on anticoagulation.  Arm pain. EXAM: CT ABDOMEN AND PELVIS WITH CONTRAST TECHNIQUE: Multidetector CT imaging of the  abdomen and pelvis was performed using the standard protocol following bolus administration of intravenous contrast. CONTRAST:  175mL ISOVUE-300 IOPAMIDOL (ISOVUE-300) INJECTION 61% COMPARISON:  CTA 07/05/2016 FINDINGS: Lower chest: Mild motion artifact. No pleural fluid. No consolidation. Postsurgical change of the aortic valve, partially included. Hepatobiliary: No hepatic injury or perihepatic hematoma. Gallbladder is unremarkable Pancreas: Pancreatic atrophy.  No ductal dilatation or inflammation. Spleen: No splenic injury or perisplenic hematoma. Adrenals/Urinary Tract: No adrenal hemorrhage or renal injury identified. Bilateral renal cysts. Extrarenal pelvis configuration on the right. A 2.3 cm lesion in the right kidney was previously intermediate attenuation, currently measures simple fluid and is consistent with simple cyst. Questionable mild  bladder wall thickening. No evidence of bladder injury. Stomach/Bowel: No evidence of bowel wall thickening or inflammation. No bowel injury. Rather extensive colonic diverticulosis in the sigmoid colon, lesser diverticulosis elsewhere. No diverticular inflammation. Normal appendix. Small bowel and stomach are unremarkable. No mesenteric hematoma. Vascular/Lymphatic: Abdominal aorta and IVC are intact. Moderate aortic atherosclerosis. No retroperitoneal fluid. No adenopathy. Reproductive: TURP defect in the prostate. Other: No free air, free fluid, or intra-abdominal fluid collection. No confluent body wall contusion. Musculoskeletal: No acute fracture of the included lower ribs, bony pelvis, or spine. Chronic compression fracture of L2, without progression. IMPRESSION: 1. No acute abnormality or evidence of traumatic injury to the abdomen or pelvis. 2. Previously questioned indeterminate lesion in the right kidney represents a simple cyst. 3. Aortic atherosclerosis.  No aneurysm. 4. Additional stable chronic and nontraumatic findings as described. Electronically  Signed   By: Jeb Levering M.D.   On: 02/09/2017 02:31   Dg Chest Portable 1 View  Result Date: 02/21/2017 CLINICAL DATA:  Shortness of breath. EXAM: PORTABLE CHEST 1 VIEW COMPARISON:  02/09/2017 FINDINGS: Sequelae of transcatheter aortic valve replacement are again identified. The cardiac silhouette is mildly enlarged. The lungs are slightly less well inflated than on the prior study with a similar appearance of mild peribronchial thickening. There is minimal atelectasis in the lung bases. No pleural effusion or pneumothorax is identified. No acute osseous abnormality is seen. IMPRESSION: Mild bronchitic changes.  Minimal bibasilar atelectasis. Electronically Signed   By: Logan Bores M.D.   On: 02/21/2017 18:22   2Decho 09/05/16 Study Conclusions   - Left ventricle: The cavity size was normal. There was mild   concentric hypertrophy. Systolic function was moderately reduced.   The estimated ejection fraction was in the range of 35% to 40%.   Diffuse hypokinesis. The study is not technically sufficient to   allow evaluation of LV diastolic function. - Aortic valve: A 55mm Edwards Sapien 3 bioprosthesis was present   and well-seated. Transvalvular velocity was within the normal   range. There was no stenosis. There was no regurgitation. Mean   gradient (S): 7 mm Hg. - Mitral valve: Transvalvular velocity was within the normal range.   There was no evidence for stenosis. There was mild to moderate   regurgitation directed eccentrically and posteriorly. Valve area   by continuity equation (using LVOT flow): 1.18 cm^2. - Left atrium: The atrium was severely dilated. - Right ventricle: The cavity size was normal. Wall thickness was   normal. Systolic function was normal. - Atrial septum: No defect or patent foramen ovale was identified   by color flow Doppler. - Tricuspid valve: There was mild regurgitation. - Pulmonary arteries: Systolic pressure was within the normal   range. PA peak  pressure: 34 mm Hg (S).   Cardiac cath 8/4/17Conclusion     Ost Cx to Prox Cx lesion, 50 %stenosed.  Mid Cx to Dist Cx lesion, 80 %stenosed.  Mid RCA lesion, 40 %stenosed.  Mid LAD lesion, 75 %stenosed.  2nd Diag lesion, 75 %stenosed.  Hemodynamic findings consistent with moderate pulmonary hypertension.  Unable to cross the aortic valve.  PA sat 52%. CO 3.2 L/min.   Continue with plans for CT surgery evaluation, for valve disease and two vessel CAD.       PROBLEM LIST:  Active Problems:   Hypertension   Atrial fibrillation (HCC)   Diabetes mellitus ([place])   CAD (coronary artery disease), native coronary artery   S/P TAVR (transcatheter aortic valve replacement)  Acute on chronic systolic CHF (congestive heart failure) (HCC)   Altered mental status     ASSESSMENT AND PLAN:  Chronic Afib with RVR on admission now with slow rates in 30-40's. IV diltiazem stopped and hasn't received his bisoprolol 2.5 mg that he takes daily. On Coumadin. Would hold bisoprolol for now and continue to monitor.  Agitation/confusion ? Sepsis with elevated lactic acid levels improved with IV fluids at 75cc/hr. WBC up to 13.  Acute on chronic combined CHF LVEF 35-40% on echo 08/2016. Watch closely on IV fluids. Hasn't received 2nd dose of IV lasix 20mg  today b/c hypotensive but he chronically runs low BP's. I/O's positive 2862 since admission.   S/P TAVR for severe AS 07/2016   CAD with 80% Cfx, 75% LAD nonobstructive elsewhere.   HTN-BP has been low as outpatient preventing titration of meds.    Signed, Ermalinda Barrios, PA-C  02/22/2017 12:17 PM   The patient was seen and examined, and I agree with the history, physical exam, assessment and plan as documented above, with modifications as noted below. I have also personally reviewed all relevant documentation, old records, labs, and both radiographic and cardiovascular studies. I have also independently interpreted old and new  ECG's.  83 yr old male with complex history as noted above admitted with encephalopathy, suspected due to dehydration and hypoxia. No clear evidence of infection. Elevated lactate upon admission (3.5), decreasing with IV fluids (1.8). No real change in BUN/Cr.  BNP 55.  He is sedated and thus history obtained from chart.  CT angio chest: no PE. Mild pulm edema with small b/l pleural effusions.  Had problems with bradycardia with IV diltiazem, used to control rapid atrial fibrillation upon arrival. Currently on hold, HR 50-60 bpm range while I was evaluating him.  SBP 100-115 during my evaluation.  Agree with holding bisoprolol and all AV nodal blocking agents.  Continue warfarin for anticoagulation. CAD is stable. Would carefully monitor volume status as he may develop decompensated systolic heart failure.   Kate Sable, MD, [facility]  02/22/2017 5:10 PM

## 2017-02-22 NOTE — Plan of Care (Signed)
Problem: Skin Integrity: Goal: Risk for impaired skin integrity will decrease Outcome: Progressing Patient has fall mats at bedside and family is in room to watch patient at this time

## 2017-02-22 NOTE — Progress Notes (Signed)
Patient's family left the room and telesitter was ordered to ensure that patient does not get out of bed. Haldol was also ordered PRN for agitation if needed. At this moment patient is asleep and is calm.

## 2017-02-22 NOTE — Care Management Obs Status (Signed)
[facility] NOTIFICATION   Patient Details  Name: [name] [name] MRN: [id number] Date of Birth: Jun 01, 1932   Medicare Observation Status Notification Given:  Yes [phone] Nile Dear, niece)    Lawernce Earll, Chauncey Reading, RN 02/22/2017, 2:33 PM

## 2017-02-22 NOTE — Progress Notes (Addendum)
ANTICOAGULATION CONSULT NOTE - Initial Consult  Pharmacy Consult for COUMADIN (home med) Indication: atrial fibrillation  Allergies  Allergen Reactions  . Ambien [Zolpidem Tartrate] Other (See Comments)    Sleep walks  . Lipitor [Atorvastatin Calcium] Other (See Comments)    myalgias  . Ranitidine Other (See Comments)    Chest discomfort  . Simvastatin Other (See Comments)    Myalgias  . Xanax Xr [Alprazolam Er] Other (See Comments)    Tightness in chest  . Cholestatin Other (See Comments)    UNSPECIFIED REACTION   . Clopidogrel Bisulfate Rash    Patient Measurements: Height: 6' (182.9 cm) Weight: 215 lb 2.7 oz (97.6 kg) IBW/kg (Calculated) : 77.6  Vital Signs: Temp: 97.6 F (36.4 C) (04/11 0739) Temp Source: Axillary (04/11 0739) BP: 99/54 (04/11 0900) Pulse Rate: 61 (04/11 0900)  Labs:  Recent Labs  02/21/17 1747 02/21/17 1752 02/22/17 0330  HGB 15.9  --  14.8  HCT 43.4  --  41.7  PLT 194  --  169  LABPROT  --  22.1*  --   INR  --  1.90  --   CREATININE 1.58*  --  1.65*    Estimated Creatinine Clearance: 41.8 mL/min (A) (by C-G formula based on SCr of 1.65 mg/dL (H)).   Medical History: Past Medical History:  Diagnosis Date  . Aortic stenosis    a. mod-sev by echo 05/2016.  . Asthma   . Cerebrovascular disease 07/2010   TIA; carotid ultrasound in 07/2010-significant bilateral plaque without focal internal carotid artery stenosis; MRI -encephalomalacia left temporal and right temporal lobes; small inferior right cerebellar infarct; small vessel disease  . Chronic atrial fibrillation (HCC)    Paroxysmal; Echocardiogram in 2007-normal EF; mild LVH; left atrial enlargement; mild stenosis and minimal AI; negative stress nuclear study in 2008  . Chronic systolic CHF (congestive heart failure) ([place])    a. dx 05/2016 - EF 35-40%, diffuse HK, mod-severe AS, mod gradient, severe AVA VTI likely due to decreased cardiac output in setting of systolic dysfsunction and  significant mitral regurgitation, mild MR, mod-severe MR, severe LAE, mild-mod RV dilation, mild RAE, mild-mod TR, mod PASP 34mmHg.  . CKD (chronic kidney disease), stage II   . Degenerative joint disease    feet and legs  . Diabetes mellitus    no insulin; A1c of 6.6 in 2005  . Dizziness    occurs daily,especially in am  . Exertional dyspnea   . Gastroesophageal reflux disease   . Hepatic steatosis   . History of noncompliance with medical treatment   . Hyperlipidemia    adverse reactions to statins and niacin  . Hypertension    Borderline  . Irregular heartbeat   . Mitral regurgitation    a. mod-sev by echo 05/2016  . Peripheral vascular disease ([place])   . Renal insufficiency   . S/P TAVR (transcatheter aortic valve replacement) 07/19/2016   29 mm Edwards Sapien 3 transcatheter heart valve placed via left percutaneous transfemoral approach  . Temporal arteritis ([place])   . Tricuspid regurgitation    a. mild-mod TR by echo 05/2016    Medications:  Prescriptions Prior to Admission  Medication Sig Dispense Refill Last Dose  . bisoprolol (ZEBETA) 5 MG tablet TAKE (1/2) TABLET BY MOUTH DAILY. 15 tablet 5 02/21/2017 at [place]  . furosemide (LASIX) 20 MG tablet Take 1 tablet (20 mg total) by mouth daily. 30 tablet 6 02/21/2017 at Unknown time  . losartan (COZAAR) 25 MG tablet TAKE (1/2) TABLET  BY MOUTH DAILY. 15 tablet 5 02/21/2017 at Unknown time  . nortriptyline (PAMELOR) 10 MG capsule TAKE 1 TO 2 CAPSULES AT BEDTIME FOR BURNING IN FEET. 60 capsule 5 02/20/2017 at Unknown time  . rosuvastatin (CRESTOR) 5 MG tablet TAKE (1) TABLET BY MOUTH ONCE DAILY. 30 tablet 5 02/21/2017 at Unknown time  . sitaGLIPtin (JANUVIA) 50 MG tablet Take 1 tablet (50 mg total) by mouth daily. 30 tablet 1 02/21/2017 at Unknown time  . Sodium Phosphates (FLEET ENEMA RE) Place 1 Dose rectally daily as needed (constipation). Via rectum prn daily for constipation    unknown  . spironolactone (ALDACTONE) 25 MG tablet TAKE (1)  TABLET DAILY IN THE MORNING. 30 tablet 5 02/21/2017 at Unknown time  . traZODone (DESYREL) 100 MG tablet TAKE 1 TABLET BY MOUTH AT BEDTIME. 30 tablet 5 02/20/2017 at Unknown time  . warfarin (COUMADIN) 6 MG tablet TAKE 1 TABLET AS DIRECTED. (Patient taking differently: TAKE 6MG  ON SUNDAYS, MONDAYS, WEDNESDAYS, AND FRIDAYS. TAKE 3MG  ON TUESDAYS, THURSDAYS, AND SATURDAYS) 30 tablet 5 02/20/2017 at Unknown time   Assessment: 83yo male on chronic Coumadin for h/o afib.  INR slightly below goal.  Goal of Therapy:  INR 2-3 Monitor platelets by anticoagulation protocol: Yes   Plan:  Coumadin 6mg  po today x 1 (home dose) INR daily  [name] [name] A 02/22/2017,9:12 AM

## 2017-02-22 NOTE — Progress Notes (Signed)
Patient was bathed and given PRN haldol. During bathing and at time of giving haldol patient's heart rate was in the 60s. Patient is now sound asleep and heart rate is dropping into the low 40s. Dr. Karleen Hampshire notified.

## 2017-02-22 NOTE — Progress Notes (Signed)
Dr. Karleen Hampshire paged about MRSA results being positive.

## 2017-02-22 NOTE — Progress Notes (Signed)
PROGRESS NOTE    [name] [name]  [id number] DOB: Aug 24, 1932 DOA: 02/21/2017 PCP: Sallee Lange, MD    Brief Narrative: [name] [name]  is a 83 y.o. male, With history of aortic stenosis status post TAVR, systolic CHF, atrial fibrillation, hypertension, diabetes mellitus who was recently discharged from the hospital on 02/09/2017 after he was treated for CHF exacerbation, now presents with confusion, delirium, sepsis like picture and some mild dehydration.   Assessment & Plan:   Active Problems:   Hypertension   Atrial fibrillation (HCC)   Diabetes mellitus ([place])   CAD (coronary artery disease), native coronary artery   S/P TAVR (transcatheter aortic valve replacement)   Acute on chronic systolic CHF (congestive heart failure) (HCC)   Altered mental status   Acute metabolic encephalopathy:  Suspect from dehydration and hypoxia. Currently on oxygen and prn haldol.  Hydrate gently as he has a h/o of CHF.   Sepsis: Afebrile on arrival, so far no signs of infection.  UA negative.  CXR no pneumonia.  Elevated lactic acid secondary to hypoxia and dehydration. Improving lactic acid levels with gentle hydration.  Empirically on IV antibiotics, should be transitioned off once cultures are negative.    Acute respiratory failure with hypoxia on exertion:  Mild pulm edema on CT angio, currently on 2 lit of [place] oxygen, unclear etiology.    afib with RVR:  Rate down to 40's now, off the cardizem gtt. Resume coumadin.    Diabetes Mellitus:  CBG (last 3)   Recent Labs  02/22/17 0054 02/22/17 0738 02/22/17 1134  GLUCAP 140* 144* 140*    Resume SSI.  Start on carb modified diet once he is alert and awake.    CAD:  No chest pain .    Hypertension:  bp soft, holding bp meds for hypotension, hydrate to keep MAP > 65.   Acute kidney injury:  Suspect from dehydration. On discharge his creatinine was around 1.3 and at baseline his creatinine around 1.1 Gentle hydration and  repeat renal parameters in am.     DVT prophylaxis: coumadin.  Code Status: full code.  Family Communication: discussed with family at bedside Disposition Plan: pending further eval and management and PT evaluation.    Consultants:  Cardiology for brady with pauses.    Procedures:none.     Antimicrobials: vancomycin and zosyn  Subjective: Confused with mittens  Objective: Vitals:   02/22/17 0739 02/22/17 0800 02/22/17 0900 02/22/17 1138  BP:  (!) 116/48 (!) 99/54   Pulse:  68 61   Resp:  14 17   Temp: 97.6 F (36.4 C)   97.4 F (36.3 C)  TempSrc: Axillary   Oral  SpO2:  97% 97%   Weight:      Height:        Intake/Output Summary (Last 24 hours) at 02/22/17 1250 Last data filed at 02/22/17 0800  Gross per 24 hour  Intake          3512.92 ml  Output              650 ml  Net          2862.92 ml   Filed Weights   02/21/17 1728 02/22/17 0039 02/22/17 0500  Weight: 97.1 kg (214 lb) 97.5 kg (214 lb 15.2 oz) 97.6 kg (215 lb 2.7 oz)    Examination:  General exam: Appears calm and comfortable  Respiratory system: Clear to auscultation. Respiratory effort normal. Cardiovascular system: S1 & S2 heard, RRR. No JVD, murmurs,  rubs, gallops or clicks. No pedal edema. Gastrointestinal system: Abdomen is nondistended, soft and nontender. No organomegaly or masses felt. Normal bowel sounds heard. Central nervous system: confused, . No focal neurological deficits. Extremities: Symmetric 5 x 5 power. Skin: No rashes, lesions or ulcers     Data Reviewed: I have personally reviewed following labs and imaging studies  CBC:  Recent Labs Lab 02/21/17 1747 02/22/17 0330  WBC 11.4* 13.0*  NEUTROABS 9.2*  --   HGB 15.9 14.8  HCT 43.4 41.7  MCV 84.9 88.5  PLT 194 109   Basic Metabolic Panel:  Recent Labs Lab 02/21/17 1747 02/22/17 0330  NA 130* 130*  K 4.0 4.2  CL 103 97*  CO2 13* 21*  GLUCOSE 161* 170*  BUN 26* 24*  CREATININE 1.58* 1.65*  CALCIUM 9.7  8.3*   GFR: Estimated Creatinine Clearance: 41.8 mL/min (A) (by C-G formula based on SCr of 1.65 mg/dL (H)). Liver Function Tests:  Recent Labs Lab 02/21/17 1747 02/22/17 0330  AST 26 32  ALT 23 20  ALKPHOS 88 78  BILITOT 1.3* 1.6*  PROT 7.8 7.0  ALBUMIN 4.1 3.7   No results for input(s): LIPASE, AMYLASE in the last 168 hours. No results for input(s): AMMONIA in the last 168 hours. Coagulation Profile:  Recent Labs Lab 02/21/17 1752  INR 1.90   Cardiac Enzymes: No results for input(s): CKTOTAL, CKMB, CKMBINDEX, TROPONINI in the last 168 hours. BNP (last 3 results) No results for input(s): PROBNP in the last 8760 hours. HbA1C: No results for input(s): HGBA1C in the last 72 hours. CBG:  Recent Labs Lab 02/22/17 0054 02/22/17 0738 02/22/17 1134  GLUCAP 140* 144* 140*   Lipid Profile: No results for input(s): CHOL, HDL, LDLCALC, TRIG, CHOLHDL, LDLDIRECT in the last 72 hours. Thyroid Function Tests: No results for input(s): TSH, T4TOTAL, FREET4, T3FREE, THYROIDAB in the last 72 hours. Anemia Panel: No results for input(s): VITAMINB12, FOLATE, FERRITIN, TIBC, IRON, RETICCTPCT in the last 72 hours. Sepsis Labs:  Recent Labs Lab 02/21/17 1903 02/22/17 0108 02/22/17 0330 02/22/17 0922  LATICACIDVEN 3.5* 4.5* 4.1* 1.8    Recent Results (from the past 240 hour(s))  Blood Culture (routine x 2)     Status: None (Preliminary result)   Collection Time: 02/21/17  6:59 PM  Result Value Ref Range Status   Specimen Description BLOOD RIGHT HAND  Final   Special Requests   Final    BOTTLES DRAWN AEROBIC AND ANAEROBIC Blood Culture adequate volume   Culture PENDING  Incomplete   Report Status PENDING  Incomplete  Blood Culture (routine x 2)     Status: None (Preliminary result)   Collection Time: 02/21/17  7:03 PM  Result Value Ref Range Status   Specimen Description RIGHT ANTECUBITAL  Final   Special Requests   Final    BOTTLES DRAWN AEROBIC AND ANAEROBIC Blood  Culture adequate volume   Culture PENDING  Incomplete   Report Status PENDING  Incomplete  MRSA PCR Screening     Status: Abnormal   Collection Time: 02/22/17 12:30 AM  Result Value Ref Range Status   MRSA by PCR POSITIVE (A) NEGATIVE Final    Comment:        The GeneXpert MRSA Assay (FDA approved for NASAL specimens only), is one component of a comprehensive MRSA colonization surveillance program. It is not intended to diagnose MRSA infection nor to guide or monitor treatment for MRSA infections. RESULT CALLED TO, READ BACK BY AND VERIFIED WITH: MOTLEY J.  AT [id number] ON [id number] BY THOMPSON S.          Radiology Studies: Ct Angio Chest Pe W And/or Wo Contrast  Result Date: 02/21/2017 CLINICAL DATA:  Dyspnea without chest pain EXAM: CT ANGIOGRAPHY CHEST WITH CONTRAST TECHNIQUE: Multidetector CT imaging of the chest was performed using the standard protocol during bolus administration of intravenous contrast. Multiplanar CT image reconstructions and MIPs were obtained to evaluate the vascular anatomy. CONTRAST:  100 cc Isovue 370 IV COMPARISON:  06/15/2016, CXR 02/21/2017 FINDINGS: Cardiovascular: Top normal size cardiac chambers. No pericardial effusion. Aortic valvular repair. Three-vessel coronary arteriosclerosis. Aortic atherosclerosis without aneurysm. No large central pulmonary embolus. Mediastinum/Nodes: No enlarged mediastinal, hilar, or axillary lymph nodes. Thyroid gland, trachea, and esophagus demonstrate no significant findings. Lungs/Pleura: Respiratory motion artifacts slightly limit assessment. Small bilateral pleural effusions with central vascular congestion consistent pulmonary edema. Subpleural blebs are noted in the apices with areas of centrilobular emphysema more so within the right upper lobe. Upper Abdomen: No acute abnormality. Musculoskeletal: No chest wall abnormality. No acute or significant osseous findings. Review of the MIP images confirms the above findings.  IMPRESSION: 1. No acute central pulmonary embolus. Three-vessel coronary arteriosclerosis with aortic atherosclerosis. Status post aortic valve repair. 2. Findings suggest mild pulmonary edema with small bilateral pleural effusions. Mild emphysematous change in the upper lobes. Electronically Signed   By: Ashley Royalty M.D.   On: 02/21/2017 22:17   Dg Chest Portable 1 View  Result Date: 02/21/2017 CLINICAL DATA:  Shortness of breath. EXAM: PORTABLE CHEST 1 VIEW COMPARISON:  02/09/2017 FINDINGS: Sequelae of transcatheter aortic valve replacement are again identified. The cardiac silhouette is mildly enlarged. The lungs are slightly less well inflated than on the prior study with a similar appearance of mild peribronchial thickening. There is minimal atelectasis in the lung bases. No pleural effusion or pneumothorax is identified. No acute osseous abnormality is seen. IMPRESSION: Mild bronchitic changes.  Minimal bibasilar atelectasis. Electronically Signed   By: Logan Bores M.D.   On: 02/21/2017 18:22        Scheduled Meds: . bisoprolol  2.5 mg Oral Daily  . furosemide  20 mg Intravenous Q12H  . insulin aspart  0-9 Units Subcutaneous TID WC  . piperacillin-tazobactam (ZOSYN)  IV  3.375 g Intravenous Q8H  . rosuvastatin  5 mg Oral Daily  . sodium chloride flush  3 mL Intravenous Q12H  . spironolactone  25 mg Oral Daily  . vancomycin  1,250 mg Intravenous Q24H  . warfarin  6 mg Oral Once  . Warfarin - Pharmacist Dosing Inpatient   Does not apply Q24H   Continuous Infusions: . sodium chloride 75 mL/hr at 02/22/17 1038     LOS: 0 days    Time spent: 30 minutes.     Hosie Poisson, MD [facility] Pager [phone]  If 7PM-7AM, please contact night-coverage [email or web address] Password [email or web address] 02/22/2017, 12:50 PM

## 2017-02-22 NOTE — Care Management Note (Signed)
Case Management Note  Patient Details  Name: TAMARI BUSIC MRN: [id number] Date of Birth: [birth year]  Subjective/Objective:  Adm with AMS/?sepsis. From home with wife, ind with ADL's. Walks with a RW, has WC if needed. Patient has 24/7 caregivers. He is active with [facility] for PT services.  CM spoke with [name] (niece) over the phone. Patient asleep with tele-monitor sitter in place.            Action/Plan: Anticipate DC home with resumption of HH orders and in care of family/caregivers. Will follow for ongoing needs. ? O2 needs,    Expected Discharge Date:  02/25/17               Expected Discharge Plan:  [place]  In-House Referral:     Discharge planning Services  CM Consult  Post Acute Care Choice:  [place], Resumption of Svcs/PTA Provider Choice offered to:  Patient  DME Arranged:    DME Agency:     HH Arranged:    [place] Agency:  [place]  Status of Service:  In process, will continue to follow  If discussed at Long Length of Stay Meetings, dates discussed:    Additional Comments:  Perrin Gens, Chauncey [place], RN 02/22/2017, 2:34 PM

## 2017-02-22 NOTE — Progress Notes (Signed)
Critical lactic acid 4.5 called by lab. MD alerted via text page. No further orders at this time. Patient remains drowsy but oriented to situation time person and place. VSS at this time.

## 2017-02-23 DIAGNOSIS — A419 Sepsis, unspecified organism: Principal | ICD-10-CM

## 2017-02-23 DIAGNOSIS — E860 Dehydration: Secondary | ICD-10-CM

## 2017-02-23 LAB — BASIC METABOLIC PANEL
ANION GAP: 8 (ref 5–15)
BUN: 18 mg/dL (ref 6–20)
CO2: 26 mmol/L (ref 22–32)
Calcium: 8.9 mg/dL (ref 8.9–10.3)
Chloride: 104 mmol/L (ref 101–111)
Creatinine, Ser: 1.1 mg/dL (ref 0.61–1.24)
GFR calc Af Amer: >60 mL/min (ref >60)
GFR calc non Af Amer: >60 mL/min (ref >60)
GLUCOSE: 121 mg/dL — AB (ref 65–99)
Potassium: 3.9 mmol/L (ref 3.5–5.1)
Sodium: 138 mmol/L (ref 135–145)

## 2017-02-23 LAB — PROTIME-INR
INR: 1.98
PROTHROMBIN TIME: 22.8 s — AB (ref 11.4–15.2)

## 2017-02-23 LAB — CBC
HEMATOCRIT: 44.4 % (ref 39.0–52.0)
HEMOGLOBIN: 15 g/dL (ref 13.0–17.0)
MCH: 30.7 pg (ref 26.0–34.0)
MCHC: 33.8 g/dL (ref 30.0–36.0)
MCV: 90.8 fL (ref 78.0–100.0)
Platelets: 135 10*3/uL — ABNORMAL LOW (ref 150–400)
RBC: 4.89 MIL/uL (ref 4.22–5.81)
RDW: 14 % (ref 11.5–15.5)
WBC: 9.2 10*3/uL (ref 4.0–10.5)

## 2017-02-23 LAB — GLUCOSE, CAPILLARY
GLUCOSE-CAPILLARY: 119 mg/dL — AB (ref 65–99)
GLUCOSE-CAPILLARY: 141 mg/dL — AB (ref 65–99)
Glucose-Capillary: 138 mg/dL — ABNORMAL HIGH (ref 65–99)
Glucose-Capillary: 101 mg/dL — ABNORMAL HIGH (ref 65–99)

## 2017-02-23 LAB — HEMOGLOBIN A1C
HEMOGLOBIN A1C: 7.6 % — AB (ref 4.8–5.6)
MEAN PLASMA GLUCOSE: 171 mg/dL

## 2017-02-23 MED ORDER — WARFARIN SODIUM 1 MG PO TABS
3.0000 mg | ORAL_TABLET | Freq: Once | ORAL | Status: AC
  Administered 2017-02-23: 3 mg via ORAL
  Filled 2017-02-23: qty 1

## 2017-02-23 MED ORDER — CHLORHEXIDINE GLUCONATE CLOTH 2 % EX PADS
6.0000 | MEDICATED_PAD | Freq: Every day | CUTANEOUS | Status: DC
  Administered 2017-02-23 – 2017-02-24 (×2): 6 via TOPICAL

## 2017-02-23 MED ORDER — MUPIROCIN 2 % EX OINT
1.0000 "application " | TOPICAL_OINTMENT | Freq: Two times a day (BID) | CUTANEOUS | Status: DC
  Administered 2017-02-23 – 2017-02-24 (×3): 1 via NASAL
  Filled 2017-02-23 (×2): qty 22

## 2017-02-23 MED ORDER — FUROSEMIDE 20 MG PO TABS
20.0000 mg | ORAL_TABLET | Freq: Every day | ORAL | Status: DC
  Administered 2017-02-24: 20 mg via ORAL
  Filled 2017-02-23: qty 1

## 2017-02-23 NOTE — Progress Notes (Signed)
MD made aware of patient having some A fib, PVC's, and flutter. I assessed patient, he appears to be fine. Has history of A fib. No new orders at this time.

## 2017-02-23 NOTE — Progress Notes (Signed)
PROGRESS NOTE    [name] [name]  [id number] DOB: 1932-01-07 DOA: 02/21/2017 PCP: Sallee Lange, MD    Brief Narrative: [name] [name]  is a 83 y.o. male, With history of aortic stenosis status post TAVR, systolic CHF, atrial fibrillation, hypertension, diabetes mellitus who was recently discharged from the hospital on 02/09/2017 after he was treated for CHF exacerbation, now presents with confusion, delirium, sepsis like picture and some mild dehydration.   Assessment & Plan:   Active Problems:   Hypertension   Atrial fibrillation (HCC)   Diabetes mellitus ([place])   CAD (coronary artery disease), native coronary artery   S/P TAVR (transcatheter aortic valve replacement)   Acute on chronic systolic CHF (congestive heart failure) (HCC)   Altered mental status   Acute metabolic encephalopathy:  Suspect from dehydration and hypoxia. Currently on oxygen and prn haldol.  Hydrated gently as he has a h/o of CHF. His ental status cleared up and he is alert and awake and oriented.  D/c haldol.   Sepsis: Afebrile on arrival, so far no signs of infection.  UA negative.  CXR no pneumonia.  Elevated lactic acid secondary to hypoxia and dehydration. Improving lactic acid levels with gentle hydration. Repeat lactic acid level is normal.  Empirically on IV antibiotics, should be transitioned off once cultures are negative.    Acute respiratory failure with hypoxia on exertion:  Mild pulm edema on CT angio, currently on 2 lit of [place] oxygen, unclear etiology. Plan to wean him off oxygen in the next 24 hours.    afib with RVR:  Rate down to 40's now, off the cardizem gtt. Resume coumadin.  INR is 1.98   Diabetes Mellitus:  CBG (last 3)   Recent Labs  02/22/17 1614 02/22/17 2129 02/23/17 0744  GLUCAP 123* 116* 119*    Resume SSI.  Start on carb modified diet once he is alert and awake.    CAD:  No chest pain .    Hypertension:  bp soft, holding bp meds for hypotension, hydrate  to keep MAP > 65.   Acute kidney injury:  Suspect from dehydration. On discharge his creatinine was around 1.3 and at baseline his creatinine around 1.1 Gentle hydration and repeat renal parameters in am show much improvement. .     DVT prophylaxis: coumadin.  Code Status: full code.  Family Communication: discussed with family at bedside Disposition Plan: pending further eval and management and PT evaluation.    Consultants:  Cardiology for brady with pauses.    Procedures:none.     Antimicrobials: vancomycin and zosyn  Subjective: Alert and comfortable.   Objective: Vitals:   02/23/17 0600 02/23/17 0700 02/23/17 0800 02/23/17 0804  BP: 129/84 (!) 122/59 124/78   Pulse: 79 81 79   Resp: 12 17 15    Temp:    97.8 F (36.6 C)  TempSrc:    Oral  SpO2: 100% 100% 100%   Weight:      Height:        Intake/Output Summary (Last 24 hours) at 02/23/17 1033 Last data filed at 02/23/17 0805  Gross per 24 hour  Intake          1919.88 ml  Output             2025 ml  Net          -105.12 ml   Filed Weights   02/22/17 0039 02/22/17 0500 02/23/17 0500  Weight: 97.5 kg (214 lb 15.2 oz) 97.6 kg (215 lb  2.7 oz) 97.5 kg (214 lb 15.2 oz)    Examination:  General exam: Appears calm and comfortable  Respiratory system: Clear to auscultation. Respiratory effort normal. Cardiovascular system: S1 & S2 heard, RRR. No JVD, murmurs, rubs, gallops or clicks. No pedal edema. Gastrointestinal system: Abdomen is nondistended, soft and nontender. No organomegaly or masses felt. Normal bowel sounds heard. Central nervous system: confused, . No focal neurological deficits. Extremities: Symmetric 5 x 5 power. Skin: No rashes, lesions or ulcers     Data Reviewed: I have personally reviewed following labs and imaging studies  CBC:  Recent Labs Lab 02/21/17 1747 02/22/17 0330 02/23/17 0413  WBC 11.4* 13.0* 9.2  NEUTROABS 9.2*  --   --   HGB 15.9 14.8 15.0  HCT 43.4 41.7 44.4    MCV 84.9 88.5 90.8  PLT 194 169 585*   Basic Metabolic Panel:  Recent Labs Lab 02/21/17 1747 02/22/17 0330 02/23/17 0413  NA 130* 130* 138  K 4.0 4.2 3.9  CL 103 97* 104  CO2 13* 21* 26  GLUCOSE 161* 170* 121*  BUN 26* 24* 18  CREATININE 1.58* 1.65* 1.10  CALCIUM 9.7 8.3* 8.9   GFR: Estimated Creatinine Clearance: 62.7 mL/min (by C-G formula based on SCr of 1.1 mg/dL). Liver Function Tests:  Recent Labs Lab 02/21/17 1747 02/22/17 0330  AST 26 32  ALT 23 20  ALKPHOS 88 78  BILITOT 1.3* 1.6*  PROT 7.8 7.0  ALBUMIN 4.1 3.7   No results for input(s): LIPASE, AMYLASE in the last 168 hours. No results for input(s): AMMONIA in the last 168 hours. Coagulation Profile:  Recent Labs Lab 02/21/17 1752 02/23/17 0413  INR 1.90 1.98   Cardiac Enzymes: No results for input(s): CKTOTAL, CKMB, CKMBINDEX, TROPONINI in the last 168 hours. BNP (last 3 results) No results for input(s): PROBNP in the last 8760 hours. HbA1C:  Recent Labs  02/21/17 1747  HGBA1C 7.6*   CBG:  Recent Labs Lab 02/22/17 0738 02/22/17 1134 02/22/17 1614 02/22/17 2129 02/23/17 0744  GLUCAP 144* 140* 123* 116* 119*   Lipid Profile: No results for input(s): CHOL, HDL, LDLCALC, TRIG, CHOLHDL, LDLDIRECT in the last 72 hours. Thyroid Function Tests: No results for input(s): TSH, T4TOTAL, FREET4, T3FREE, THYROIDAB in the last 72 hours. Anemia Panel: No results for input(s): VITAMINB12, FOLATE, FERRITIN, TIBC, IRON, RETICCTPCT in the last 72 hours. Sepsis Labs:  Recent Labs Lab 02/21/17 1903 02/22/17 0108 02/22/17 0330 02/22/17 0922  LATICACIDVEN 3.5* 4.5* 4.1* 1.8    Recent Results (from the past 240 hour(s))  Blood Culture (routine x 2)     Status: None (Preliminary result)   Collection Time: 02/21/17  6:59 PM  Result Value Ref Range Status   Specimen Description BLOOD RIGHT HAND  Final   Special Requests   Final    BOTTLES DRAWN AEROBIC AND ANAEROBIC Blood Culture adequate  volume   Culture NO GROWTH < 24 HOURS  Final   Report Status PENDING  Incomplete  Blood Culture (routine x 2)     Status: None (Preliminary result)   Collection Time: 02/21/17  7:03 PM  Result Value Ref Range Status   Specimen Description RIGHT ANTECUBITAL  Final   Special Requests   Final    BOTTLES DRAWN AEROBIC AND ANAEROBIC Blood Culture adequate volume   Culture NO GROWTH < 24 HOURS  Final   Report Status PENDING  Incomplete  MRSA PCR Screening     Status: Abnormal   Collection Time: 02/22/17 12:30  AM  Result Value Ref Range Status   MRSA by PCR POSITIVE (A) NEGATIVE Final    Comment:        The GeneXpert MRSA Assay (FDA approved for NASAL specimens only), is one component of a comprehensive MRSA colonization surveillance program. It is not intended to diagnose MRSA infection nor to guide or monitor treatment for MRSA infections. RESULT CALLED TO, READ BACK BY AND VERIFIED WITH: MOTLEY J. AT [id number] ON [id number] BY THOMPSON S.          Radiology Studies: Ct Angio Chest Pe W And/or Wo Contrast  Result Date: 02/21/2017 CLINICAL DATA:  Dyspnea without chest pain EXAM: CT ANGIOGRAPHY CHEST WITH CONTRAST TECHNIQUE: Multidetector CT imaging of the chest was performed using the standard protocol during bolus administration of intravenous contrast. Multiplanar CT image reconstructions and MIPs were obtained to evaluate the vascular anatomy. CONTRAST:  100 cc Isovue 370 IV COMPARISON:  06/15/2016, CXR 02/21/2017 FINDINGS: Cardiovascular: Top normal size cardiac chambers. No pericardial effusion. Aortic valvular repair. Three-vessel coronary arteriosclerosis. Aortic atherosclerosis without aneurysm. No large central pulmonary embolus. Mediastinum/Nodes: No enlarged mediastinal, hilar, or axillary lymph nodes. Thyroid gland, trachea, and esophagus demonstrate no significant findings. Lungs/Pleura: Respiratory motion artifacts slightly limit assessment. Small bilateral pleural effusions with  central vascular congestion consistent pulmonary edema. Subpleural blebs are noted in the apices with areas of centrilobular emphysema more so within the right upper lobe. Upper Abdomen: No acute abnormality. Musculoskeletal: No chest wall abnormality. No acute or significant osseous findings. Review of the MIP images confirms the above findings. IMPRESSION: 1. No acute central pulmonary embolus. Three-vessel coronary arteriosclerosis with aortic atherosclerosis. Status post aortic valve repair. 2. Findings suggest mild pulmonary edema with small bilateral pleural effusions. Mild emphysematous change in the upper lobes. Electronically Signed   By: Ashley Royalty M.D.   On: 02/21/2017 22:17   Dg Chest Portable 1 View  Result Date: 02/21/2017 CLINICAL DATA:  Shortness of breath. EXAM: PORTABLE CHEST 1 VIEW COMPARISON:  02/09/2017 FINDINGS: Sequelae of transcatheter aortic valve replacement are again identified. The cardiac silhouette is mildly enlarged. The lungs are slightly less well inflated than on the prior study with a similar appearance of mild peribronchial thickening. There is minimal atelectasis in the lung bases. No pleural effusion or pneumothorax is identified. No acute osseous abnormality is seen. IMPRESSION: Mild bronchitic changes.  Minimal bibasilar atelectasis. Electronically Signed   By: Logan Bores M.D.   On: 02/21/2017 18:22        Scheduled Meds: . [START ON 02/24/2017] furosemide  20 mg Oral Daily  . insulin aspart  0-9 Units Subcutaneous TID WC  . piperacillin-tazobactam (ZOSYN)  IV  3.375 g Intravenous Q8H  . rosuvastatin  5 mg Oral Daily  . sodium chloride flush  3 mL Intravenous Q12H  . spironolactone  25 mg Oral Daily  . vancomycin  1,250 mg Intravenous Q24H  . warfarin  3 mg Oral Once  . warfarin  6 mg Oral Once  . Warfarin - Pharmacist Dosing Inpatient   Does not apply Q24H   Continuous Infusions:    LOS: 1 day    Time spent: 30 minutes.     Hosie Poisson,  MD [facility] Pager [phone]  If 7PM-7AM, please contact night-coverage [email or web address] Password [facility] 02/23/2017, 10:33 AM

## 2017-02-23 NOTE — Evaluation (Signed)
Physical Therapy Evaluation Patient Details Name: [name] [name] MRN: [id number] DOB: 10/21/1932 Today's Date: 02/23/2017   History of Present Illness  83 y.o. male, With history of aortic stenosis status post TAVR, systolic CHF, atrial fibrillation, hypertension, diabetes mellitus who was recently discharged from the hospital on 02/09/2017 after he was treated for CHF exacerbation.  Dx: Acute metabolic encephalopathy due to dehydration and hypoxia, sepsis, acute on chronic respiratory failure.    Clinical Impression  Pt received in bed, niece and nephew present, and pt is agreeable to PT evaluation.  Pt is mildly confused noted with need for repetition with commands at times.  He states that at baseline he occasionally uses a RW for ambulation.  He lives with his wife, who has an aide, and he also has an Engineer, production at home.  They have 24/7 supervision, and were receiving HHPT 1x/wk.  During today's PT evaluation, he demonstrates sit<>stand with RW and ambulated 30ft with RW and min guard due to some slight unsteadiness.  Pt is recommended to return home with continued 24/7 supervision, and continue HHPT for balance, strength, and endurance.  Recommended that pt use energy conservation techniques after showering because his shower is in the basement.    Follow Up Recommendations Home health PT;Supervision/Assistance - 24 hour    Equipment Recommendations       Recommendations for Other Services       Precautions / Restrictions Precautions Precautions: Fall Precaution Comments: Pt reports multiple falls, but cannot recall how they occurred Restrictions Weight Bearing Restrictions: No      Mobility  Bed Mobility Overal bed mobility: Needs Assistance Bed Mobility: Supine to Sit     Supine to sit: Supervision     General bed mobility comments: increased time.   Transfers Overall transfer level: Needs assistance Equipment used: Rolling walker (2 wheeled) Transfers: Sit to/from  Stand Sit to Stand: Min guard            Ambulation/Gait Ambulation/Gait assistance: Min guard Ambulation Distance (Feet): 20 Feet Assistive device: Rolling walker (2 wheeled) Gait Pattern/deviations: Step-to pattern;Shuffle;Trunk flexed   Gait velocity interpretation: <1.8 ft/sec, indicative of risk for recurrent falls    Stairs            Wheelchair Mobility    Modified Rankin (Stroke Patients Only)       Balance Overall balance assessment: History of Falls;Needs assistance Sitting-balance support: Bilateral upper extremity supported;Feet supported Sitting balance-Leahy Scale: Good     Standing balance support: Bilateral upper extremity supported Standing balance-Leahy Scale: Fair                               Pertinent Vitals/Pain Pain Assessment: No/denies pain    Home Living   Living Arrangements: Spouse/significant other Available Help at Discharge: Family;Available 24 hours/day;Home health (wife has an aide, and the patient has an Engineer, production.  Pt and family confirm that he has 24/7 supervision.  ) Type of Home: House Home Access: Stairs to enter   [employer] of Steps: 6 Home Layout: Two level Home Equipment: [place] - 2 wheels;Cane - single point;Wheelchair - manual      Prior Function Level of Independence: Independent with assistive device(s)   Gait / Transfers Assistance Needed: Pt states he occasionally uses the RW for ambulation.  Pt also occasinally uses a cane.  ADL's / Homemaking Assistance Needed: Aides assist with running errands and getting groceries.  Hand Dominance        Extremity/Trunk Assessment   Upper Extremity Assessment Upper Extremity Assessment: Overall WFL for tasks assessed    Lower Extremity Assessment Lower Extremity Assessment: Overall WFL for tasks assessed       Communication   Communication:  (Pt is forgetful and requires repetition. )  Cognition Arousal/Alertness:  Awake/alert Behavior During Therapy: WFL for tasks assessed/performed Overall Cognitive Status: Impaired/Different from baseline                                 General Comments: Pt requires repetition and demonstrates improvement with following commands with visual cues.        General Comments      Exercises     Assessment/Plan    PT Assessment Patient needs continued PT services  PT Problem List Decreased strength;Decreased activity tolerance;Decreased balance;Decreased mobility;Decreased cognition;Decreased knowledge of use of DME       PT Treatment Interventions DME instruction;Gait training;Functional mobility training;Stair training;Therapeutic activities;Therapeutic exercise;Balance training;Cognitive remediation;Patient/family education    PT Goals (Current goals can be found in the Care Plan section)  Acute Rehab PT Goals Patient Stated Goal: To go home.  PT Goal Formulation: With patient Time For Goal Achievement: 03/09/17 Potential to Achieve Goals: Good    Frequency Min 3X/week   Barriers to discharge        Co-evaluation               End of Session Equipment Utilized During Treatment: Gait belt;Oxygen Activity Tolerance: Patient tolerated treatment well Patient left: in chair;with call bell/phone within reach;with family/visitor present Nurse Communication: Mobility status (Crystal, RN notified of pt's mobiltiy and mobility sheet left in the room. ) PT Visit Diagnosis: Other abnormalities of gait and mobility (R26.89);Muscle weakness (generalized) (M62.81);History of falling (Z91.81)    Time: 1657-9038 PT Time Calculation (min) (ACUTE ONLY): 31 min   Charges:   PT Evaluation $PT Eval Low Complexity: 1 Procedure PT Treatments $Gait Training: 8-22 mins   PT G Codes:   PT G-Codes **NOT FOR INPATIENT CLASS** Functional Assessment Tool Used: AM-PAC 6 Clicks Basic Mobility;Clinical judgement Functional Limitation: Mobility: Walking  and moving around Mobility: Walking and Moving Around Current Status (B3383): At least 40 percent but less than 60 percent impaired, limited or restricted Mobility: Walking and Moving Around Goal Status [phone]): At least 20 percent but less than 40 percent impaired, limited or restricted    Beth Talvin Christianson, PT, DPT X: [phone]

## 2017-02-23 NOTE — Clinical Social Work Note (Signed)
Patient is from home with 24/7 caregivers. Patient's family desires for patient to return home with HHPT.    LCSW signing off.       Coleston Dirosa, Clydene Pugh, LCSW

## 2017-02-23 NOTE — Progress Notes (Signed)
Patient noted more alert and oriented this morning. Able to give name, birth date and place. He's recognizing family members and recalling long term memories. He reports that he is hungry and would like to eat. Patient currently NPO. MD notified.

## 2017-02-23 NOTE — Progress Notes (Signed)
ANTICOAGULATION CONSULT NOTE -  Pharmacy Consult for COUMADIN (home med) Indication: atrial fibrillation  Allergies  Allergen Reactions  . Ambien [Zolpidem Tartrate] Other (See Comments)    Sleep walks  . Lipitor [Atorvastatin Calcium] Other (See Comments)    myalgias  . Ranitidine Other (See Comments)    Chest discomfort  . Simvastatin Other (See Comments)    Myalgias  . Xanax Xr [Alprazolam Er] Other (See Comments)    Tightness in chest  . Cholestatin Other (See Comments)    UNSPECIFIED REACTION   . Clopidogrel Bisulfate Rash    Patient Measurements: Height: 6' (182.9 cm) Weight: 214 lb 15.2 oz (97.5 kg) IBW/kg (Calculated) : 77.6  Vital Signs: Temp: 97.8 F (36.6 C) (04/12 0804) Temp Source: Oral (04/12 0804) BP: 124/78 (04/12 0800) Pulse Rate: 79 (04/12 0800)  Labs:  Recent Labs  02/21/17 1747 02/21/17 1752 02/22/17 0330 02/23/17 0413  HGB 15.9  --  14.8 15.0  HCT 43.4  --  41.7 44.4  PLT 194  --  169 135*  LABPROT  --  22.1*  --  22.8*  INR  --  1.90  --  1.98  CREATININE 1.58*  --  1.65* 1.10    Estimated Creatinine Clearance: 62.7 mL/min (by C-G formula based on SCr of 1.1 mg/dL).   Medical History: Past Medical History:  Diagnosis Date  . Aortic stenosis    a. mod-sev by echo 05/2016.  . Asthma   . Cerebrovascular disease 07/2010   TIA; carotid ultrasound in 07/2010-significant bilateral plaque without focal internal carotid artery stenosis; MRI -encephalomalacia left temporal and right temporal lobes; small inferior right cerebellar infarct; small vessel disease  . Chronic atrial fibrillation (HCC)    Paroxysmal; Echocardiogram in 2007-normal EF; mild LVH; left atrial enlargement; mild stenosis and minimal AI; negative stress nuclear study in 2008  . Chronic systolic CHF (congestive heart failure) ([place])    a. dx 05/2016 - EF 35-40%, diffuse HK, mod-severe AS, mod gradient, severe AVA VTI likely due to decreased cardiac output in setting of systolic  dysfsunction and significant mitral regurgitation, mild MR, mod-severe MR, severe LAE, mild-mod RV dilation, mild RAE, mild-mod TR, mod PASP 3mmHg.  . CKD (chronic kidney disease), stage II   . Degenerative joint disease    feet and legs  . Diabetes mellitus    no insulin; A1c of 6.6 in 2005  . Dizziness    occurs daily,especially in am  . Exertional dyspnea   . Gastroesophageal reflux disease   . Hepatic steatosis   . History of noncompliance with medical treatment   . Hyperlipidemia    adverse reactions to statins and niacin  . Hypertension    Borderline  . Irregular heartbeat   . Mitral regurgitation    a. mod-sev by echo 05/2016  . Peripheral vascular disease ([place])   . Renal insufficiency   . S/P TAVR (transcatheter aortic valve replacement) 07/19/2016   29 mm Edwards Sapien 3 transcatheter heart valve placed via left percutaneous transfemoral approach  . Temporal arteritis ([place])   . Tricuspid regurgitation    a. mild-mod TR by echo 05/2016    Medications:  Prescriptions Prior to Admission  Medication Sig Dispense Refill Last Dose  . bisoprolol (ZEBETA) 5 MG tablet TAKE (1/2) TABLET BY MOUTH DAILY. 15 tablet 5 02/21/2017 at [facility]  . furosemide (LASIX) 20 MG tablet Take 1 tablet (20 mg total) by mouth daily. 30 tablet 6 02/21/2017 at Unknown time  . losartan (COZAAR) 25 MG  tablet TAKE (1/2) TABLET BY MOUTH DAILY. 15 tablet 5 02/21/2017 at Unknown time  . nortriptyline (PAMELOR) 10 MG capsule TAKE 1 TO 2 CAPSULES AT BEDTIME FOR BURNING IN FEET. 60 capsule 5 02/20/2017 at Unknown time  . rosuvastatin (CRESTOR) 5 MG tablet TAKE (1) TABLET BY MOUTH ONCE DAILY. 30 tablet 5 02/21/2017 at Unknown time  . sitaGLIPtin (JANUVIA) 50 MG tablet Take 1 tablet (50 mg total) by mouth daily. 30 tablet 1 02/21/2017 at Unknown time  . Sodium Phosphates (FLEET ENEMA RE) Place 1 Dose rectally daily as needed (constipation). Via rectum prn daily for constipation    unknown  . spironolactone (ALDACTONE) 25 MG  tablet TAKE (1) TABLET DAILY IN THE MORNING. 30 tablet 5 02/21/2017 at Unknown time  . traZODone (DESYREL) 100 MG tablet TAKE 1 TABLET BY MOUTH AT BEDTIME. 30 tablet 5 02/20/2017 at Unknown time  . warfarin (COUMADIN) 6 MG tablet TAKE 1 TABLET AS DIRECTED. (Patient taking differently: TAKE 6MG  ON SUNDAYS, MONDAYS, WEDNESDAYS, AND FRIDAYS. TAKE 3MG  ON TUESDAYS, THURSDAYS, AND SATURDAYS) 30 tablet 5 02/20/2017 at Unknown time   Assessment: 83yo male on chronic Coumadin for h/o afib.  INR slightly below goal at 1.98. Continue home regimen.  Goal of Therapy:  INR 2-3 Monitor platelets by anticoagulation protocol: Yes   Plan:  Coumadin 3mg  po today x 1 (home dose) INR daily Monitor for S/S of bleeding  Isac Sarna, BS [place]ena [place], BCPS Clinical Pharmacist Pager [phone] 02/23/2017,9:12 AM

## 2017-02-23 NOTE — Progress Notes (Signed)
[place] order per Dr.Akula given to d/c tele sitter at this time

## 2017-02-23 NOTE — Progress Notes (Signed)
Progress Note  Patient Name: [name] [name] Date of Encounter: 02/23/2017  Primary Cardiologist: Bronson Ing [facility] Dr. Lattie Haw).   Subjective   No complaints. Is now AAO X 3. Denies chest pain. Breathing is improved   Inpatient Medications    Scheduled Meds: . bisoprolol  2.5 mg Oral Daily  . furosemide  20 mg Intravenous Q12H  . insulin aspart  0-9 Units Subcutaneous TID WC  . piperacillin-tazobactam (ZOSYN)  IV  3.375 g Intravenous Q8H  . rosuvastatin  5 mg Oral Daily  . sodium chloride flush  3 mL Intravenous Q12H  . spironolactone  25 mg Oral Daily  . vancomycin  1,250 mg Intravenous Q24H  . warfarin  6 mg Oral Once  . Warfarin - Pharmacist Dosing Inpatient   Does not apply Q24H   Continuous Infusions: . sodium chloride 75 mL/hr at 02/23/17 0600   PRN Meds: sodium chloride, acetaminophen **OR** acetaminophen, haloperidol lactate, ondansetron **OR** ondansetron (ZOFRAN) IV, sodium chloride flush   Vital Signs    Vitals:   02/23/17 0300 02/23/17 0400 02/23/17 0500 02/23/17 0600  BP: 122/68 130/61 123/62 129/84  Pulse: 79 84 76 79  Resp: 15 16 17 12   Temp:  97.8 F (36.6 C)    TempSrc:  Oral    SpO2: 98% 96% 100% 100%  Weight:   214 lb 15.2 oz (97.5 kg)   Height:        Intake/Output Summary (Last 24 hours) at 02/23/17 0735 Last data filed at 02/23/17 0600  Gross per 24 hour  Intake           1982.8 ml  Output             1750 ml  Net            232.8 ml   Filed Weights   02/22/17 0039 02/22/17 0500 02/23/17 0500  Weight: 214 lb 15.2 oz (97.5 kg) 215 lb 2.7 oz (97.6 kg) 214 lb 15.2 oz (97.5 kg)    Telemetry     Afib with with LBBB, 2.0 second pauses, rates in the 60's-70's.  Personally Reviewed  ECG    -Afib with LBBB no acute change Personally Reviewed  Physical Exam   GEN: No acute distress.   Neck: No JVD Cardiac: IRRR,  no murmurs, no rubs, or gallops.  Respiratory: Clear to auscultation bilaterally. GI: Soft, nontender,  non-distended  MS: No edema; No deformity. Neuro:  Nonfocal  Psych: Normal affect   Labs    Chemistry Recent Labs Lab 02/21/17 1747 02/22/17 0330 02/23/17 0413  NA 130* 130* 138  K 4.0 4.2 3.9  CL 103 97* 104  CO2 13* 21* 26  GLUCOSE 161* 170* 121*  BUN 26* 24* 18  CREATININE 1.58* 1.65* 1.10  CALCIUM 9.7 8.3* 8.9  PROT 7.8 7.0  --   ALBUMIN 4.1 3.7  --   AST 26 32  --   ALT 23 20  --   ALKPHOS 88 78  --   BILITOT 1.3* 1.6*  --   GFRNONAA 39* 37* >60  GFRAA 45* 43* >60  ANIONGAP 14 12 8      Hematology Recent Labs Lab 02/21/17 1747 02/22/17 0330 02/23/17 0413  WBC 11.4* 13.0* 9.2  RBC 5.11 4.71 4.89  HGB 15.9 14.8 15.0  HCT 43.4 41.7 44.4  MCV 84.9 88.5 90.8  MCH 31.1 31.4 30.7  MCHC 36.6* 35.5 33.8  RDW 13.4 13.6 14.0  PLT 194 169 135*    Cardiac  EnzymesNo results for input(s): TROPONINI in the last 168 hours.  Recent Labs Lab 02/21/17 1757  TROPIPOC 0.01     BNP Recent Labs Lab 02/21/17 1747  BNP 55.0     DDimer No results for input(s): DDIMER in the last 168 hours.   Radiology    Ct Angio Chest Pe W And/or Wo Contrast  Result Date: 02/21/2017 CLINICAL DATA:  Dyspnea without chest pain EXAM: CT ANGIOGRAPHY CHEST WITH CONTRAST TECHNIQUE: Multidetector CT imaging of the chest was performed using the standard protocol during bolus administration of intravenous contrast. Multiplanar CT image reconstructions and MIPs were obtained to evaluate the vascular anatomy. CONTRAST:  100 cc Isovue 370 IV COMPARISON:  06/15/2016, CXR 02/21/2017 FINDINGS: Cardiovascular: Top normal size cardiac chambers. No pericardial effusion. Aortic valvular repair. Three-vessel coronary arteriosclerosis. Aortic atherosclerosis without aneurysm. No large central pulmonary embolus. Mediastinum/Nodes: No enlarged mediastinal, hilar, or axillary lymph nodes. Thyroid gland, trachea, and esophagus demonstrate no significant findings. Lungs/Pleura: Respiratory motion artifacts  slightly limit assessment. Small bilateral pleural effusions with central vascular congestion consistent pulmonary edema. Subpleural blebs are noted in the apices with areas of centrilobular emphysema more so within the right upper lobe. Upper Abdomen: No acute abnormality. Musculoskeletal: No chest wall abnormality. No acute or significant osseous findings. Review of the MIP images confirms the above findings. IMPRESSION: 1. No acute central pulmonary embolus. Three-vessel coronary arteriosclerosis with aortic atherosclerosis. Status post aortic valve repair. 2. Findings suggest mild pulmonary edema with small bilateral pleural effusions. Mild emphysematous change in the upper lobes. Electronically Signed   By: Ashley Royalty M.D.   On: 02/21/2017 22:17   Dg Chest Portable 1 View  Result Date: 02/21/2017 CLINICAL DATA:  Shortness of breath. EXAM: PORTABLE CHEST 1 VIEW COMPARISON:  02/09/2017 FINDINGS: Sequelae of transcatheter aortic valve replacement are again identified. The cardiac silhouette is mildly enlarged. The lungs are slightly less well inflated than on the prior study with a similar appearance of mild peribronchial thickening. There is minimal atelectasis in the lung bases. No pleural effusion or pneumothorax is identified. No acute osseous abnormality is seen. IMPRESSION: Mild bronchitic changes.  Minimal bibasilar atelectasis. Electronically Signed   By: Logan Bores M.D.   On: 02/21/2017 18:22    Cardiac Studies  Echocardiogram 02/09/2017 Left ventricle: The cavity size was normal. Wall thickness was   increased in a pattern of mild LVH. The estimated ejection   fraction was 40%. Diffuse hypokinesis. The study is not   technically sufficient to allow evaluation of LV diastolic   function. - Aortic valve: 40mm Edwards Sapien 3 bioprosthesis present in   aortic position. No obvious perivalvular leak. There was no   significant regurgitation. Mean gradient (S): 6 mm Hg. - Mitral valve:  Moderately calcified annulus. There was mild   regurgitation. - Left atrium: The atrium was moderately dilated. - Right atrium: The atrium was moderately dilated. Central venous   pressure (est): 3 mm Hg. - Atrial septum: No defect or patent foramen ovale was identified. - Tricuspid valve: There was trivial regurgitation. - Pulmonary arteries: PA peak pressure: 36 mm Hg (S). - Pericardium, extracardiac: There was no pericardial effusion.  Cardiac cath 8/4/17Conclusion    Ost Cx to Prox Cx lesion, 50 %stenosed.  Mid Cx to Dist Cx lesion, 80 %stenosed.  Mid RCA lesion, 40 %stenosed.  Mid LAD lesion, 75 %stenosed.  2nd Diag lesion, 75 %stenosed.  Hemodynamic findings consistent with moderate pulmonary hypertension.  Unable to cross the aortic valve.  PA  sat 52%. CO 3.2 L/min.     Patient Profile     83 y.o. male with a hx of severeaortic stenosis, chronic atrial fibrillation, hypertension, systolic HF, type II diabetes mellitus, hyperlipidemia, and cerebrovascular disease who underwent recent R CEA and TAVR (07/19/2016). He presented to ER with acute on chronic combined CHF, bradycardia and confusion.   Assessment & Plan    1. Chronic Atrial fib:  Rates are bradycardic, with 2.0 second pauses. He was taken off of bisoprolol per note, but still on medication list. Will officially d/c.  He remains on coumadin, with no active bleeding. INR 1.98.   2. S/P TAVR: Echocardiogram reveals normally functioning valve without leaking.  3. CAD:  Hx of 80% CX, and LAD 75%. Troponin negative X 3.   4. Hypertension:  Currently stable and normalized.   Signed, Jory Sims, NP  02/23/2017, 7:35 AM    The patient was seen and examined, and I agree with the history, physical exam, assessment and plan as documented above, with modifications as noted below. I have also personally reviewed all relevant documentation and labs.  Pt doing much better and without complaints. Appears he  was dehydrated. Also on vanco and Zosyn for possible sepsis. WBC now normal and renal function reflects IV fluid repletement.  Currently in atrial fibrillation with HR in 80 bpm range on telemetry. For the time being, continue to hold AV nodal blocking agents. Continue warfarin for anticoagulation.  No longer has pulmonary edema (lungs are clear on exam). On Lasix 20 mg daily now and spironolactone 25 mg. CAD is stable (no chest pain).   Kate Sable, MD, [facility]  02/23/2017 11:00 AM

## 2017-02-23 NOTE — Progress Notes (Signed)
[place] IV access, attempted to gain IV access x 4 attempts w/o success. MD notified & Dept 300 nurse updated on no IV access at this time.

## 2017-02-23 NOTE — Progress Notes (Signed)
Patient lost IV access on way up from ICU. ICU nurse has not reobtained IV access yet. MD wishes to have IV access for IV ABT.

## 2017-02-23 NOTE — Progress Notes (Signed)
Patient OOB up in chair, A&O x4, able to make needs known. Ate 100% of brkfst w/o difficulty. Swallowed pills whole w/o difficulty. Family at bedside at this time.

## 2017-02-24 DIAGNOSIS — I4891 Unspecified atrial fibrillation: Secondary | ICD-10-CM

## 2017-02-24 LAB — GLUCOSE, CAPILLARY
GLUCOSE-CAPILLARY: 196 mg/dL — AB (ref 65–99)
Glucose-Capillary: 146 mg/dL — ABNORMAL HIGH (ref 65–99)

## 2017-02-24 LAB — PROTIME-INR
INR: 1.95
Prothrombin Time: 22.5 seconds — ABNORMAL HIGH (ref 11.4–15.2)

## 2017-02-24 MED ORDER — VANCOMYCIN HCL 10 G IV SOLR
1250.0000 mg | INTRAVENOUS | Status: DC
  Administered 2017-02-24: 1250 mg via INTRAVENOUS
  Filled 2017-02-24 (×4): qty 1250

## 2017-02-24 MED ORDER — WARFARIN SODIUM 5 MG PO TABS
6.0000 mg | ORAL_TABLET | Freq: Once | ORAL | Status: AC
  Administered 2017-02-24: 6 mg via ORAL
  Filled 2017-02-24: qty 1

## 2017-02-24 MED ORDER — LOSARTAN POTASSIUM 25 MG PO TABS: 12.5000 mg | ORAL_TABLET | Freq: Every day | ORAL | 0 refills | Status: DC

## 2017-02-24 MED ORDER — MUPIROCIN 2 % EX OINT: 1.0000 "application " | TOPICAL_OINTMENT | Freq: Two times a day (BID) | CUTANEOUS | 0 refills | Status: DC

## 2017-02-24 NOTE — Progress Notes (Signed)
Pharmacy Antibiotic Note  [name] [name] is a 83 y.o. male admitted on 02/21/2017 with sepsis.  Pharmacy has been consulted for Vancomycin and zosyn dosing.   Plan: Continue Vancomycin 1250 mg IV q24 hours (doses rescheduled as dose not given last night due to loss of IV access. Cont zosyn 3.375 gm IV q8 hours f/u renal function, cultures and clinical course  Height: 6' (182.9 cm) Weight: 214 lb 15.2 oz (97.5 kg) IBW/kg (Calculated) : 77.6  Temp (24hrs), Avg:97.9 F (36.6 C), Min:97.6 F (36.4 C), Max:98.4 F (36.9 C)   Recent Labs Lab 02/21/17 1747 02/21/17 1759 02/21/17 1903 02/22/17 0108 02/22/17 0330 02/22/17 0922 02/23/17 0413  WBC 11.4*  --   --   --  13.0*  --  9.2  CREATININE 1.58*  --   --   --  1.65*  --  1.10  LATICACIDVEN  --  4.46* 3.5* 4.5* 4.1* 1.8  --     Estimated Creatinine Clearance: 62.7 mL/min (by C-G formula based on SCr of 1.1 mg/dL).    Allergies  Allergen Reactions  . Ambien [Zolpidem Tartrate] Other (See Comments)    Sleep walks  . Lipitor [Atorvastatin Calcium] Other (See Comments)    myalgias  . Ranitidine Other (See Comments)    Chest discomfort  . Simvastatin Other (See Comments)    Myalgias  . Xanax Xr [Alprazolam Er] Other (See Comments)    Tightness in chest  . Cholestatin Other (See Comments)    UNSPECIFIED REACTION   . Clopidogrel Bisulfate Rash   Antimicrobials this admission: zosyn 4/10 >>  vanc 4/10 >>   Micro: 4/10 BCx: ngtd 4/10 MRSA PCR (+) 4/10 influenza  negative  Thank you for allowing pharmacy to be a part of this patient's care.  [name] 02/24/2017 8:33 AM

## 2017-02-24 NOTE — Progress Notes (Signed)
[place] for COUMADIN (home med) Indication: atrial fibrillation  Allergies  Allergen Reactions  . Ambien [Zolpidem Tartrate] Other (See Comments)    Sleep walks  . Lipitor [Atorvastatin Calcium] Other (See Comments)    myalgias  . Ranitidine Other (See Comments)    Chest discomfort  . Simvastatin Other (See Comments)    Myalgias  . Xanax Xr [Alprazolam Er] Other (See Comments)    Tightness in chest  . Cholestatin Other (See Comments)    UNSPECIFIED REACTION   . Clopidogrel Bisulfate Rash    Patient Measurements: Height: 6' (182.9 cm) Weight: 214 lb 15.2 oz (97.5 kg) IBW/kg (Calculated) : 77.6  Vital Signs: Temp: 97.8 F (36.6 C) (04/13 0600) Temp Source: Oral (04/13 0600) BP: 132/69 (04/13 0600) Pulse Rate: 75 (04/13 0600)  Labs:  Recent Labs  02/21/17 1747 02/21/17 1752 02/22/17 0330 02/23/17 0413 02/24/17 0634  HGB 15.9  --  14.8 15.0  --   HCT 43.4  --  41.7 44.4  --   PLT 194  --  169 135*  --   LABPROT  --  22.1*  --  22.8* 22.5*  INR  --  1.90  --  1.98 1.95  CREATININE 1.58*  --  1.65* 1.10  --     Estimated Creatinine Clearance: 62.7 mL/min (by C-G formula based on SCr of 1.1 mg/dL).   Medical History: Past Medical History:  Diagnosis Date  . Aortic stenosis    a. mod-sev by echo 05/2016.  . Asthma   . Cerebrovascular disease 07/2010   TIA; carotid ultrasound in 07/2010-significant bilateral plaque without focal internal carotid artery stenosis; MRI -encephalomalacia left temporal and right temporal lobes; small inferior right cerebellar infarct; small vessel disease  . Chronic atrial fibrillation (HCC)    Paroxysmal; Echocardiogram in 2007-normal EF; mild LVH; left atrial enlargement; mild stenosis and minimal AI; negative stress nuclear study in 2008  . Chronic systolic CHF (congestive heart failure) ([place])    a. dx 05/2016 - EF 35-40%, diffuse HK, mod-severe AS, mod gradient, severe AVA VTI likely due to  decreased cardiac output in setting of systolic dysfsunction and significant mitral regurgitation, mild MR, mod-severe MR, severe LAE, mild-mod RV dilation, mild RAE, mild-mod TR, mod PASP 33mmHg.  . CKD (chronic kidney disease), stage II   . Degenerative joint disease    feet and legs  . Diabetes mellitus    no insulin; A1c of 6.6 in 2005  . Dizziness    occurs daily,especially in am  . Exertional dyspnea   . Gastroesophageal reflux disease   . Hepatic steatosis   . History of noncompliance with medical treatment   . Hyperlipidemia    adverse reactions to statins and niacin  . Hypertension    Borderline  . Irregular heartbeat   . Mitral regurgitation    a. mod-sev by echo 05/2016  . Peripheral vascular disease ([place])   . Renal insufficiency   . S/P TAVR (transcatheter aortic valve replacement) 07/19/2016   29 mm Edwards Sapien 3 transcatheter heart valve placed via left percutaneous transfemoral approach  . Temporal arteritis ([place])   . Tricuspid regurgitation    a. mild-mod TR by echo 05/2016    Medications:  Prescriptions Prior to Admission  Medication Sig Dispense Refill Last Dose  . bisoprolol (ZEBETA) 5 MG tablet TAKE (1/2) TABLET BY MOUTH DAILY. 15 tablet 5 02/21/2017 at [place]  . furosemide (LASIX) 20 MG tablet Take 1 tablet (20 mg total) by  mouth daily. 30 tablet 6 02/21/2017 at Unknown time  . losartan (COZAAR) 25 MG tablet TAKE (1/2) TABLET BY MOUTH DAILY. 15 tablet 5 02/21/2017 at Unknown time  . nortriptyline (PAMELOR) 10 MG capsule TAKE 1 TO 2 CAPSULES AT BEDTIME FOR BURNING IN FEET. 60 capsule 5 02/20/2017 at Unknown time  . rosuvastatin (CRESTOR) 5 MG tablet TAKE (1) TABLET BY MOUTH ONCE DAILY. 30 tablet 5 02/21/2017 at Unknown time  . sitaGLIPtin (JANUVIA) 50 MG tablet Take 1 tablet (50 mg total) by mouth daily. 30 tablet 1 02/21/2017 at Unknown time  . Sodium Phosphates (FLEET ENEMA RE) Place 1 Dose rectally daily as needed (constipation). Via rectum prn daily for constipation     unknown  . spironolactone (ALDACTONE) 25 MG tablet TAKE (1) TABLET DAILY IN THE MORNING. 30 tablet 5 02/21/2017 at Unknown time  . traZODone (DESYREL) 100 MG tablet TAKE 1 TABLET BY MOUTH AT BEDTIME. 30 tablet 5 02/20/2017 at Unknown time  . warfarin (COUMADIN) 6 MG tablet TAKE 1 TABLET AS DIRECTED. (Patient taking differently: TAKE 6MG  ON SUNDAYS, MONDAYS, WEDNESDAYS, AND FRIDAYS. TAKE 3MG  ON TUESDAYS, THURSDAYS, AND SATURDAYS) 30 tablet 5 02/20/2017 at Unknown time   Assessment: 83yo male on chronic Coumadin for h/o afib.  INR slightly below goal at 1.98. (dose not given 4/11) Continue home regimen.  Goal of Therapy:  INR 2-3 Monitor platelets by anticoagulation protocol: Yes   Plan:  Coumadin 6mg  po today x 1 (home dose) INR daily Monitor for S/S of bleeding  [name], [facility] 02/24/2017,8:29 AM

## 2017-02-24 NOTE — Progress Notes (Signed)
Progress Note  Patient Name: [name] [name] Date of Encounter: 02/24/2017  Primary Cardiologist: Bensimhon  Subjective   Feeling much better today Not SOB or dizzy. No confusion. Going to have a trial off O2 this AM.  Inpatient Medications    Scheduled Meds: . Chlorhexidine Gluconate Cloth  6 each Topical Q0600  . furosemide  20 mg Oral Daily  . insulin aspart  0-9 Units Subcutaneous TID WC  . mupirocin ointment  1 application Nasal BID  . piperacillin-tazobactam (ZOSYN)  IV  3.375 g Intravenous Q8H  . rosuvastatin  5 mg Oral Daily  . sodium chloride flush  3 mL Intravenous Q12H  . spironolactone  25 mg Oral Daily  . vancomycin  1,250 mg Intravenous Q24H  . warfarin  6 mg Oral Once  . Warfarin - Pharmacist Dosing Inpatient   Does not apply Q24H   Continuous Infusions:  PRN Meds: sodium chloride, acetaminophen **OR** acetaminophen, haloperidol lactate, ondansetron **OR** ondansetron (ZOFRAN) IV, sodium chloride flush   Vital Signs    Vitals:   02/23/17 1448 02/23/17 1959 02/23/17 2131 02/24/17 0600  BP: (!) 120/95  (!) 133/46 132/69  Pulse: 76  81 75  Resp: 18  18 16   Temp: 98.4 F (36.9 C)  97.6 F (36.4 C) 97.8 F (36.6 C)  TempSrc: Oral  Oral Oral  SpO2: 96% 96% 100% 100%  Weight:      Height:        Intake/Output Summary (Last 24 hours) at 02/24/17 0833 Last data filed at 02/24/17 0600  Gross per 24 hour  Intake              100 ml  Output             1400 ml  Net            -1300 ml   Filed Weights   02/22/17 0039 02/22/17 0500 02/23/17 0500  Weight: 214 lb 15.2 oz (97.5 kg) 215 lb 2.7 oz (97.6 kg) 214 lb 15.2 oz (97.5 kg)    Telemetry    Atrial fib rates 60s-90s - Personally Reviewed   Physical Exam   GEN: No acute distress, chronically ill appearing WM HEENT: Normocephalic, atraumatic, sclera non-icteric. Neck: No JVD or bruits. Cardiac: Irregularly irregular, no murmurs, rubs, or gallops.  Radials/DP/PT 1+ and equal bilaterally.    Respiratory: Clear to auscultation bilaterally. Breathing is unlabored. GI: Soft, nontender, non-distended, BS +x 4. MS: no deformity. Extremities: No clubbing or cyanosis. Diffuse ecchymosis, No edema. Distal pedal pulses are 2+ and equal bilaterally. Neuro:  AAOx3. Follows commands. Psych:  Responds to questions appropriately with a normal affect.  Labs    Chemistry Recent Labs Lab 02/21/17 1747 02/22/17 0330 02/23/17 0413  NA 130* 130* 138  K 4.0 4.2 3.9  CL 103 97* 104  CO2 13* 21* 26  GLUCOSE 161* 170* 121*  BUN 26* 24* 18  CREATININE 1.58* 1.65* 1.10  CALCIUM 9.7 8.3* 8.9  PROT 7.8 7.0  --   ALBUMIN 4.1 3.7  --   AST 26 32  --   ALT 23 20  --   ALKPHOS 88 78  --   BILITOT 1.3* 1.6*  --   GFRNONAA 39* 37* >60  GFRAA 45* 43* >60  ANIONGAP 14 12 8      Hematology Recent Labs Lab 02/21/17 1747 02/22/17 0330 02/23/17 0413  WBC 11.4* 13.0* 9.2  RBC 5.11 4.71 4.89  HGB 15.9 14.8 15.0  HCT 43.4  41.7 44.4  MCV 84.9 88.5 90.8  MCH 31.1 31.4 30.7  MCHC 36.6* 35.5 33.8  RDW 13.4 13.6 14.0  PLT 194 169 135*    Cardiac EnzymesNo results for input(s): TROPONINI in the last 168 hours.  Recent Labs Lab 02/21/17 1757  TROPIPOC 0.01     BNP Recent Labs Lab 02/21/17 1747  BNP 55.0     DDimer No results for input(s): DDIMER in the last 168 hours.   Radiology    No results found.  Cardiac Studies   Echocardiogram 02/09/2017 Left ventricle: The cavity size was normal. Wall thickness was increased in a pattern of mild LVH. The estimated ejection fraction was 40%. Diffuse hypokinesis. The study is not technically sufficient to allow evaluation of LV diastolic function. - Aortic valve: 19mm Edwards Sapien 3 bioprosthesis present in aortic position. No obvious perivalvular leak. There was no significant regurgitation. Mean gradient (S): 6 mm Hg. - Mitral valve: Moderately calcified annulus. There was mild regurgitation. - Left atrium: The  atrium was moderately dilated. - Right atrium: The atrium was moderately dilated. Central venous pressure (est): 3 mm Hg. - Atrial septum: No defect or patent foramen ovale was identified. - Tricuspid valve: There was trivial regurgitation. - Pulmonary arteries: PA peak pressure: 36 mm Hg (S). - Pericardium, extracardiac: There was no pericardial effusion.  Cardiac cath 8/4/17Conclusion    Ost Cx to Prox Cx lesion, 50 %stenosed.  Mid Cx to Dist Cx lesion, 80 %stenosed.  Mid RCA lesion, 40 %stenosed.  Mid LAD lesion, 75 %stenosed.  2nd Diag lesion, 75 %stenosed.  Hemodynamic findings consistent with moderate pulmonary hypertension.  Unable to cross the aortic valve.  PA sat 52%. CO 3.2 L/min.     Patient Profile     83 y.o. male with a hx of severeaortic stenosis and cerebrovascular disease (s/p R CEA and TAVR 02/85), chronic systolic CHF with prior cardiogenic shock requiring milrinone prior to TAVR, moderate CAD by cath 06/2016 (75% LAD, 80% distal Cfx, otherwise nonobstructive) treated medically, chronic atrial fibrillation, prior TIA, hypertension with more recent h/o orthostatic hypotension, type II diabetes mellitus, hyperlipidemia, CKD II, temporal arteritis who presented 02/21/2017 with confusion/lethargy. Initially in afib RVR but developed bradycardia into the 30s-40s with diltiazem, also felt to have possible sepsis, lactic acidosis, dehydration along with concomitant mild pulm edema and AKI.   Assessment & Plan    1. Lethargy/confusion likely secondary to hypoxia and possible dehydration - much improved.  2. Chronic atrial fib - rates improved off BB. Would probably continue to hold at DC for now. Maintained on Coumadin.  3. Chronic systolic CHF - appears euvolemic. Weight 2 lb below recent OP weight. Continue current regimen.  4. CAD - no angina.  5. Severe AS s/p TAVR - stable by echo 2 weeks ago.   Signed, Charlie Pitter, PA-C  02/24/2017, 8:33 AM     The patient was seen and examined, and I agree with the history, physical exam, assessment and plan as documented above, with modifications as noted below. I have also personally reviewed all relevant documentation and labs.  He is without complaints (no chest pain, no shortness of breath, no palpitations with respect to CAD, chronic systolic heart failure, and chronic a fib, respectively).  Continue to hold beta blockers.  He is euvolemic.  No further recommendations. Will sign off.   Kate Sable, MD, [facility]  02/24/2017 9:14 AM

## 2017-02-24 NOTE — Progress Notes (Signed)
Patient discharged home. IV removed and site intact. Patient discharged with all prescriptions and personal belongings.

## 2017-02-26 LAB — CULTURE, BLOOD (ROUTINE X 2)
CULTURE: NO GROWTH
Culture: NO GROWTH
SPECIAL REQUESTS: ADEQUATE
Special Requests: ADEQUATE

## 2017-02-27 NOTE — Discharge Summary (Signed)
Physician Discharge Summary  [name] [name] [id number] DOB: 09-16-1932 DOA: 02/21/2017  PCP: Sallee Lange, MD  Admit date: 02/21/2017 Discharge date: 02/24/2017  Admitted From: Home.  Disposition: Home  Recommendations for Outpatient Follow-up:  1. Follow up with PCP in 1-2 weeks 2. Please obtain BMP/CBC in one week 3. Please follow up with cardiology as needed.     Discharge Condition:stable.  CODE STATUS: full code.  Diet recommendation: Heart Healthy  Brief/Interim Summary: [name] an 83 y.o.male,With history of aortic stenosis status post TAVR, systolic CHF, atrial fibrillation, hypertension, diabetes mellitus who was recently discharged from the hospital on 02/09/2017 after he was treated for CHF exacerbation, now presents with confusion, delirium, sepsis like picture and some mild dehydration.   Discharge Diagnoses:  Active Problems:   Hypertension   Atrial fibrillation (HCC)   Diabetes mellitus ([place])   CAD (coronary artery disease), native coronary artery   S/P TAVR (transcatheter aortic valve replacement)   Acute on chronic systolic CHF (congestive heart failure) (HCC)   Altered mental status  Acute metabolic encephalopathy:  Suspect from dehydration and hypoxia.  Hydrated gently as he has a h/o of CHF. His mental status cleared up and he is alert and awake and oriented.    Sepsis: Afebrile on arrival, so far no signs of infection.  UA negative.  CXR no pneumonia.  Elevated lactic acid secondary to hypoxia and dehydration. Improving lactic acid levels with gentle hydration. Repeat lactic acid level is normal.  Empirically on IV antibiotics, transitioned off anti biotics as cultures are negative.   Acute respiratory failure with hypoxia on exertion:  Mild pulm edema on CT angio, currently on 2 lit of [place] oxygen, weaned him off oxygen on discharge.    afib with RVR:  He was started on IV cardizem , as rate got better, off cardizem. Currently on  coumadin.  INR is 1.98   Diabetes Mellitus:  CBG (last 3)   Recent Labs (last 2 labs)    Recent Labs  02/22/17 1614 02/22/17 2129 02/23/17 0744  GLUCAP 123* 116* 119*         CAD:  No chest pain .    Hypertension:  Well controlled.   Acute kidney injury:  Suspect from dehydration. On discharge his creatinine was around 1.3 and at baseline his creatinine around 1.1 Gentle hydration and repeat renal parameters in am show much improvement. .    Discharge Instructions  Discharge Instructions    (HEART FAILURE PATIENTS) Call MD:  Anytime you have any of the following symptoms: 1) 3 pound weight gain in 24 hours or 5 pounds in 1 week 2) shortness of breath, with or without a dry hacking cough 3) swelling in the hands, feet or stomach 4) if you have to sleep on extra pillows at night in order to breathe.    Complete by:  As directed    Diet - low sodium heart healthy    Complete by:  As directed    Discharge instructions    Complete by:  As directed    Please follow up with cardiology in 2 weeks or as needed.     Allergies as of 02/24/2017      Reactions   Ambien [zolpidem Tartrate] Other (See Comments)   Sleep walks   Lipitor [atorvastatin Calcium] Other (See Comments)   myalgias   Ranitidine Other (See Comments)   Chest discomfort   Simvastatin Other (See Comments)   Myalgias   Xanax Xr [alprazolam Er] Other (See  Comments)   Tightness in chest   Cholestatin Other (See Comments)   UNSPECIFIED REACTION    Clopidogrel Bisulfate Rash      Medication List    STOP taking these medications   bisoprolol 5 MG tablet Commonly known as:  ZEBETA     TAKE these medications   FLEET ENEMA RE Place 1 Dose rectally daily as needed (constipation). Via rectum prn daily for constipation   furosemide 20 MG tablet Commonly known as:  LASIX Take 1 tablet (20 mg total) by mouth daily.   losartan 25 MG tablet Commonly known as:  COZAAR Take 0.5 tablets (12.5  mg total) by mouth daily. What changed:  See the new instructions.   mupirocin ointment 2 % Commonly known as:  BACTROBAN Place 1 application into the nose 2 (two) times daily.   nortriptyline 10 MG capsule Commonly known as:  PAMELOR TAKE 1 TO 2 CAPSULES AT BEDTIME FOR BURNING IN FEET.   rosuvastatin 5 MG tablet Commonly known as:  CRESTOR TAKE (1) TABLET BY MOUTH ONCE DAILY.   sitaGLIPtin 50 MG tablet Commonly known as:  JANUVIA Take 1 tablet (50 mg total) by mouth daily.   spironolactone 25 MG tablet Commonly known as:  ALDACTONE TAKE (1) TABLET DAILY IN THE MORNING.   traZODone 100 MG tablet Commonly known as:  DESYREL TAKE 1 TABLET BY MOUTH AT BEDTIME.   warfarin 6 MG tablet Commonly known as:  COUMADIN TAKE 1 TABLET AS DIRECTED. What changed:  See the new instructions.      Follow-up Information    Sallee Lange, MD. Schedule an appointment as soon as possible for a visit in 1 week(s).   Specialty:  Family Medicine Contact information: [place] [address] [phone]          Allergies  Allergen Reactions  . Ambien [Zolpidem Tartrate] Other (See Comments)    Sleep walks  . Lipitor [Atorvastatin Calcium] Other (See Comments)    myalgias  . Ranitidine Other (See Comments)    Chest discomfort  . Simvastatin Other (See Comments)    Myalgias  . Xanax Xr [Alprazolam Er] Other (See Comments)    Tightness in chest  . Cholestatin Other (See Comments)    UNSPECIFIED REACTION   . Clopidogrel Bisulfate Rash    Consultations:  cardiology   Procedures/Studies: Dg Ribs Unilateral W/chest Right  Result Date: 02/09/2017 CLINICAL DATA:  Status post fall onto right rib cage, with anterior lower rib pain. Shortness of breath. Initial encounter. EXAM: RIGHT RIBS AND CHEST - 3+ VIEW COMPARISON:  Chest radiograph performed 12/26/2016 FINDINGS: No displaced rib fractures are seen. The lungs are well-aerated. Minimal bibasilar atelectasis  is noted. Peribronchial thickening is noted. There is no evidence of pleural effusion or pneumothorax. The cardiomediastinal silhouette is within normal limits. An aortic valve replacement is noted. No acute osseous abnormalities are seen. IMPRESSION: 1. No displaced rib fracture seen. 2. Minimal bibasilar atelectasis.  Peribronchial thickening noted. Electronically Signed   By: Garald Balding M.D.   On: 02/09/2017 01:29   Ct Head Wo Contrast  Result Date: 02/09/2017 CLINICAL DATA:  83 y/o  M; status post fall. EXAM: CT HEAD WITHOUT CONTRAST TECHNIQUE: Contiguous axial images were obtained from the base of the skull through the vertex without intravenous contrast. COMPARISON:  07/02/2016 CT of the head. FINDINGS: Brain: No evidence of acute infarction, hemorrhage, hydrocephalus, extra-axial collection or mass lesion/mass effect. Stable small chronic infarcts within the right anterior temporal lobe,  left lateral temporal lobe, and bilateral cerebellar hemispheres. Stable bilateral cerebellar hemisphere dystrophic calcification. Stable small lucencies in bilateral thalamus probably representing chronic lacunar infarcts. Stable mild diffuse brain parenchymal volume loss. Vascular: Moderate calcific atherosclerosis of cavernous and paraclinoid internal carotid arteries. Skull: Normal. Negative for fracture or focal lesion. Sinuses/Orbits: No acute finding. Other: Bilateral intra-ocular lens replacement. IMPRESSION: 1. No acute intracranial abnormality identified. No displaced calvarial fracture. 2. Stable chronic infarcts in bilateral temporal lobes, cerebellar hemispheres, and basal ganglia. Electronically Signed   By: Kristine Garbe M.D.   On: 02/09/2017 02:27   Ct Angio Chest Pe W And/or Wo Contrast  Result Date: 02/21/2017 CLINICAL DATA:  Dyspnea without chest pain EXAM: CT ANGIOGRAPHY CHEST WITH CONTRAST TECHNIQUE: Multidetector CT imaging of the chest was performed using the standard protocol  during bolus administration of intravenous contrast. Multiplanar CT image reconstructions and MIPs were obtained to evaluate the vascular anatomy. CONTRAST:  100 cc Isovue 370 IV COMPARISON:  06/15/2016, CXR 02/21/2017 FINDINGS: Cardiovascular: Top normal size cardiac chambers. No pericardial effusion. Aortic valvular repair. Three-vessel coronary arteriosclerosis. Aortic atherosclerosis without aneurysm. No large central pulmonary embolus. Mediastinum/Nodes: No enlarged mediastinal, hilar, or axillary lymph nodes. Thyroid gland, trachea, and esophagus demonstrate no significant findings. Lungs/Pleura: Respiratory motion artifacts slightly limit assessment. Small bilateral pleural effusions with central vascular congestion consistent pulmonary edema. Subpleural blebs are noted in the apices with areas of centrilobular emphysema more so within the right upper lobe. Upper Abdomen: No acute abnormality. Musculoskeletal: No chest wall abnormality. No acute or significant osseous findings. Review of the MIP images confirms the above findings. IMPRESSION: 1. No acute central pulmonary embolus. Three-vessel coronary arteriosclerosis with aortic atherosclerosis. Status post aortic valve repair. 2. Findings suggest mild pulmonary edema with small bilateral pleural effusions. Mild emphysematous change in the upper lobes. Electronically Signed   By: Ashley Royalty M.D.   On: 02/21/2017 22:17   Ct Abdomen Pelvis W Contrast  Result Date: 02/09/2017 CLINICAL DATA:  Fall 2 days ago on anticoagulation.  Arm pain. EXAM: CT ABDOMEN AND PELVIS WITH CONTRAST TECHNIQUE: Multidetector CT imaging of the abdomen and pelvis was performed using the standard protocol following bolus administration of intravenous contrast. CONTRAST:  179mL ISOVUE-300 IOPAMIDOL (ISOVUE-300) INJECTION 61% COMPARISON:  CTA 07/05/2016 FINDINGS: Lower chest: Mild motion artifact. No pleural fluid. No consolidation. Postsurgical change of the aortic valve,  partially included. Hepatobiliary: No hepatic injury or perihepatic hematoma. Gallbladder is unremarkable Pancreas: Pancreatic atrophy.  No ductal dilatation or inflammation. Spleen: No splenic injury or perisplenic hematoma. Adrenals/Urinary Tract: No adrenal hemorrhage or renal injury identified. Bilateral renal cysts. Extrarenal pelvis configuration on the right. A 2.3 cm lesion in the right kidney was previously intermediate attenuation, currently measures simple fluid and is consistent with simple cyst. Questionable mild bladder wall thickening. No evidence of bladder injury. Stomach/Bowel: No evidence of bowel wall thickening or inflammation. No bowel injury. Rather extensive colonic diverticulosis in the sigmoid colon, lesser diverticulosis elsewhere. No diverticular inflammation. Normal appendix. Small bowel and stomach are unremarkable. No mesenteric hematoma. Vascular/Lymphatic: Abdominal aorta and IVC are intact. Moderate aortic atherosclerosis. No retroperitoneal fluid. No adenopathy. Reproductive: TURP defect in the prostate. Other: No free air, free fluid, or intra-abdominal fluid collection. No confluent body wall contusion. Musculoskeletal: No acute fracture of the included lower ribs, bony pelvis, or spine. Chronic compression fracture of L2, without progression. IMPRESSION: 1. No acute abnormality or evidence of traumatic injury to the abdomen or pelvis. 2. Previously questioned indeterminate lesion in the right  kidney represents a simple cyst. 3. Aortic atherosclerosis.  No aneurysm. 4. Additional stable chronic and nontraumatic findings as described. Electronically Signed   By: Jeb Levering M.D.   On: 02/09/2017 02:31   Dg Chest Portable 1 View  Result Date: 02/21/2017 CLINICAL DATA:  Shortness of breath. EXAM: PORTABLE CHEST 1 VIEW COMPARISON:  02/09/2017 FINDINGS: Sequelae of transcatheter aortic valve replacement are again identified. The cardiac silhouette is mildly enlarged. The  lungs are slightly less well inflated than on the prior study with a similar appearance of mild peribronchial thickening. There is minimal atelectasis in the lung bases. No pleural effusion or pneumothorax is identified. No acute osseous abnormality is seen. IMPRESSION: Mild bronchitic changes.  Minimal bibasilar atelectasis. Electronically Signed   By: Logan Bores M.D.   On: 02/21/2017 18:22      Subjective: No new complaints Discharge Exam: Vitals:   02/23/17 2131 02/24/17 0600  BP: (!) 133/46 132/69  Pulse: 81 75  Resp: 18 16  Temp: 97.6 F (36.4 C) 97.8 F (36.6 C)   Vitals:   02/23/17 1959 02/23/17 2131 02/24/17 0600 02/24/17 1030  BP:  (!) 133/46 132/69   Pulse:  81 75   Resp:  18 16   Temp:  97.6 F (36.4 C) 97.8 F (36.6 C)   TempSrc:  Oral Oral   SpO2: 96% 100% 100% 99%  Weight:      Height:        General: Pt is alert, awake, not in acute distress Cardiovascular: RRR, S1/S2 +, no rubs, no gallops Respiratory: CTA bilaterally, no wheezing, no rhonchi Abdominal: Soft, NT, ND, bowel sounds + Extremities: no edema, no cyanosis    The results of significant diagnostics from this hospitalization (including imaging, microbiology, ancillary and laboratory) are listed below for reference.     Microbiology: Recent Results (from the past 240 hour(s))  Blood Culture (routine x 2)     Status: None   Collection Time: 02/21/17  6:59 PM  Result Value Ref Range Status   Specimen Description BLOOD RIGHT HAND  Final   Special Requests   Final    BOTTLES DRAWN AEROBIC AND ANAEROBIC Blood Culture adequate volume   Culture NO GROWTH 5 DAYS  Final   Report Status 02/26/2017 FINAL  Final  Blood Culture (routine x 2)     Status: None   Collection Time: 02/21/17  7:03 PM  Result Value Ref Range Status   Specimen Description RIGHT ANTECUBITAL  Final   Special Requests   Final    BOTTLES DRAWN AEROBIC AND ANAEROBIC Blood Culture adequate volume   Culture NO GROWTH 5 DAYS   Final   Report Status 02/26/2017 FINAL  Final  MRSA PCR Screening     Status: Abnormal   Collection Time: 02/22/17 12:30 AM  Result Value Ref Range Status   MRSA by PCR POSITIVE (A) NEGATIVE Final    Comment:        The GeneXpert MRSA Assay (FDA approved for NASAL specimens only), is one component of a comprehensive MRSA colonization surveillance program. It is not intended to diagnose MRSA infection nor to guide or monitor treatment for MRSA infections. RESULT CALLED TO, READ BACK BY AND VERIFIED WITH: MOTLEY J. AT 0807A ON [id number] BY THOMPSON S.      Labs: BNP (last 3 results)  Recent Labs  12/26/16 1356 02/09/17 0022 02/21/17 1747  BNP 146.7* 71.0 38.2   Basic Metabolic Panel:  Recent Labs Lab 02/21/17 1747 02/22/17 0330 02/23/17  0413  NA 130* 130* 138  K 4.0 4.2 3.9  CL 103 97* 104  CO2 13* 21* 26  GLUCOSE 161* 170* 121*  BUN 26* 24* 18  CREATININE 1.58* 1.65* 1.10  CALCIUM 9.7 8.3* 8.9   Liver Function Tests:  Recent Labs Lab 02/21/17 1747 02/22/17 0330  AST 26 32  ALT 23 20  ALKPHOS 88 78  BILITOT 1.3* 1.6*  PROT 7.8 7.0  ALBUMIN 4.1 3.7   No results for input(s): LIPASE, AMYLASE in the last 168 hours. No results for input(s): AMMONIA in the last 168 hours. CBC:  Recent Labs Lab 02/21/17 1747 02/22/17 0330 02/23/17 0413  WBC 11.4* 13.0* 9.2  NEUTROABS 9.2*  --   --   HGB 15.9 14.8 15.0  HCT 43.4 41.7 44.4  MCV 84.9 88.5 90.8  PLT 194 169 135*   Cardiac Enzymes: No results for input(s): CKTOTAL, CKMB, CKMBINDEX, TROPONINI in the last 168 hours. BNP: Invalid input(s): POCBNP CBG:  Recent Labs Lab 02/23/17 1141 02/23/17 1617 02/23/17 2117 02/24/17 0803 02/24/17 1120  GLUCAP 138* 101* 141* 146* 196*   D-Dimer No results for input(s): DDIMER in the last 72 hours. Hgb A1c No results for input(s): HGBA1C in the last 72 hours. Lipid Profile No results for input(s): CHOL, HDL, LDLCALC, TRIG, CHOLHDL, LDLDIRECT in the last 72  hours. Thyroid function studies No results for input(s): TSH, T4TOTAL, T3FREE, THYROIDAB in the last 72 hours.  Invalid input(s): FREET3 Anemia work up No results for input(s): VITAMINB12, FOLATE, FERRITIN, TIBC, IRON, RETICCTPCT in the last 72 hours. Urinalysis    Component Value Date/Time   COLORURINE YELLOW 02/21/2017 1747   APPEARANCEUR CLEAR 02/21/2017 1747   LABSPEC 1.014 02/21/2017 1747   PHURINE 7.0 02/21/2017 1747   GLUCOSEU NEGATIVE 02/21/2017 1747   HGBUR NEGATIVE 02/21/2017 1747   BILIRUBINUR NEGATIVE 02/21/2017 1747   BILIRUBINUR + 06/23/2014 1707   KETONESUR 5 (A) 02/21/2017 1747   PROTEINUR NEGATIVE 02/21/2017 1747   UROBILINOGEN 0.2 08/20/2015 2030   NITRITE NEGATIVE 02/21/2017 1747   LEUKOCYTESUR NEGATIVE 02/21/2017 1747   Sepsis Labs Invalid input(s): PROCALCITONIN,  WBC,  LACTICIDVEN Microbiology Recent Results (from the past 240 hour(s))  Blood Culture (routine x 2)     Status: None   Collection Time: 02/21/17  6:59 PM  Result Value Ref Range Status   Specimen Description BLOOD RIGHT HAND  Final   Special Requests   Final    BOTTLES DRAWN AEROBIC AND ANAEROBIC Blood Culture adequate volume   Culture NO GROWTH 5 DAYS  Final   Report Status 02/26/2017 FINAL  Final  Blood Culture (routine x 2)     Status: None   Collection Time: 02/21/17  7:03 PM  Result Value Ref Range Status   Specimen Description RIGHT ANTECUBITAL  Final   Special Requests   Final    BOTTLES DRAWN AEROBIC AND ANAEROBIC Blood Culture adequate volume   Culture NO GROWTH 5 DAYS  Final   Report Status 02/26/2017 FINAL  Final  MRSA PCR Screening     Status: Abnormal   Collection Time: 02/22/17 12:30 AM  Result Value Ref Range Status   MRSA by PCR POSITIVE (A) NEGATIVE Final    Comment:        The GeneXpert MRSA Assay (FDA approved for NASAL specimens only), is one component of a comprehensive MRSA colonization surveillance program. It is not intended to diagnose MRSA infection nor  to guide or monitor treatment for MRSA infections. RESULT CALLED TO,  READ BACK BY AND VERIFIED WITH: MOTLEY J. AT 0807A ON [id number] BY THOMPSON S.      Time coordinating discharge: Over 30 minutes  SIGNED:   Hosie Poisson, MD  [facility] 02/27/2017, 9:35 AM Pager   If 7PM-7AM, please contact night-coverage [email or web address] Password [email or web address]

## 2017-02-28 ENCOUNTER — Other Ambulatory Visit: Payer: Self-pay | Admitting: Family Medicine

## 2017-02-28 ENCOUNTER — Encounter: Payer: Self-pay | Admitting: Family Medicine

## 2017-02-28 ENCOUNTER — Ambulatory Visit (INDEPENDENT_AMBULATORY_CARE_PROVIDER_SITE_OTHER): Payer: PPO | Admitting: Family Medicine

## 2017-02-28 VITALS — BP 116/72 | Ht 73.0 in | Wt 222.0 lb

## 2017-02-28 DIAGNOSIS — I481 Persistent atrial fibrillation: Secondary | ICD-10-CM | POA: Diagnosis not present

## 2017-02-28 DIAGNOSIS — I5042 Chronic combined systolic (congestive) and diastolic (congestive) heart failure: Secondary | ICD-10-CM | POA: Diagnosis not present

## 2017-02-28 DIAGNOSIS — Z7901 Long term (current) use of anticoagulants: Secondary | ICD-10-CM | POA: Diagnosis not present

## 2017-02-28 DIAGNOSIS — E1142 Type 2 diabetes mellitus with diabetic polyneuropathy: Secondary | ICD-10-CM | POA: Diagnosis not present

## 2017-02-28 DIAGNOSIS — E0842 Diabetes mellitus due to underlying condition with diabetic polyneuropathy: Secondary | ICD-10-CM

## 2017-02-28 DIAGNOSIS — I4819 Other persistent atrial fibrillation: Secondary | ICD-10-CM

## 2017-02-28 LAB — POCT INR: INR: 2.3

## 2017-02-28 MED ORDER — SPIRONOLACTONE 25 MG PO TABS: 12.5000 mg | ORAL_TABLET | Freq: Every day | ORAL | 5 refills | Status: DC

## 2017-02-28 NOTE — Progress Notes (Signed)
   Subjective:    Patient ID: [name] [name], male    DOB: 1931-12-28, 83 y.o.   MRN: [id number]  HPI Patient arrives for a follow up from recent hospitalization for a low O2 saturation.Hospital records were reviewed in detail. Patient did have scan did not show any tumors growths or pneumonia did show possible early pulmonary edema patient did have low blood pressures. Patient's A1c under decent control. INR with Coumadin overall doing fairly well. Patient mildly dehydrated was given IV fluids did do better after all of that   Review of Systems Patient relates a lot of fatigue tiredness gets out of breath with activity denies chest tightness vomiting diarrhea. States energy level poor to fair    Objective:   Physical Exam Neck no masses lungs clear heart irregular rate controlled extremities no edema skin warm dry Patient went numbness in his feet from diabetes  25 minutes was spent with the patient. Greater than half the time was spent in discussion and answering questions and counseling regarding the issues that the patient came in for today.     Assessment & Plan:  Chronic Coumadin INR looks good continue current measures  Cardiomyopathy CHF along with persistent atrial fibrillation blood pressure is on the low end reduce Aldactone to have tablet each morning. Currently taking losartan half tablet daily will recheck patient in one week  Diabetes fair control continue current measures watch diet  Frailty with fall risk-I encouraged patient not to be doing lawnmowing or back ho use.  Patient with spinal stenosis seen specialist they will evaluate whether or not to do injections patient is not good candidate for surgery.

## 2017-03-03 DIAGNOSIS — I482 Chronic atrial fibrillation: Secondary | ICD-10-CM | POA: Diagnosis not present

## 2017-03-03 DIAGNOSIS — E1122 Type 2 diabetes mellitus with diabetic chronic kidney disease: Secondary | ICD-10-CM | POA: Diagnosis not present

## 2017-03-03 DIAGNOSIS — Z7901 Long term (current) use of anticoagulants: Secondary | ICD-10-CM | POA: Diagnosis not present

## 2017-03-03 DIAGNOSIS — I130 Hypertensive heart and chronic kidney disease with heart failure and stage 1 through stage 4 chronic kidney disease, or unspecified chronic kidney disease: Secondary | ICD-10-CM | POA: Diagnosis not present

## 2017-03-03 DIAGNOSIS — I5023 Acute on chronic systolic (congestive) heart failure: Secondary | ICD-10-CM | POA: Diagnosis not present

## 2017-03-03 DIAGNOSIS — Z7984 Long term (current) use of oral hypoglycemic drugs: Secondary | ICD-10-CM | POA: Diagnosis not present

## 2017-03-03 DIAGNOSIS — N183 Chronic kidney disease, stage 3 (moderate): Secondary | ICD-10-CM | POA: Diagnosis not present

## 2017-03-03 DIAGNOSIS — E785 Hyperlipidemia, unspecified: Secondary | ICD-10-CM | POA: Diagnosis not present

## 2017-03-03 DIAGNOSIS — Z79891 Long term (current) use of opiate analgesic: Secondary | ICD-10-CM | POA: Diagnosis not present

## 2017-03-03 DIAGNOSIS — I350 Nonrheumatic aortic (valve) stenosis: Secondary | ICD-10-CM | POA: Diagnosis not present

## 2017-03-03 DIAGNOSIS — Z7951 Long term (current) use of inhaled steroids: Secondary | ICD-10-CM | POA: Diagnosis not present

## 2017-03-07 ENCOUNTER — Encounter: Payer: Self-pay | Admitting: Family Medicine

## 2017-03-07 ENCOUNTER — Telehealth: Payer: Self-pay | Admitting: Family Medicine

## 2017-03-07 ENCOUNTER — Ambulatory Visit (INDEPENDENT_AMBULATORY_CARE_PROVIDER_SITE_OTHER): Payer: PPO | Admitting: Family Medicine

## 2017-03-07 VITALS — BP 122/80 | Ht 73.0 in | Wt 221.0 lb

## 2017-03-07 DIAGNOSIS — I482 Chronic atrial fibrillation, unspecified: Secondary | ICD-10-CM

## 2017-03-07 DIAGNOSIS — E0842 Diabetes mellitus due to underlying condition with diabetic polyneuropathy: Secondary | ICD-10-CM | POA: Diagnosis not present

## 2017-03-07 DIAGNOSIS — I5042 Chronic combined systolic (congestive) and diastolic (congestive) heart failure: Secondary | ICD-10-CM

## 2017-03-07 DIAGNOSIS — M545 Low back pain, unspecified: Secondary | ICD-10-CM

## 2017-03-07 DIAGNOSIS — R0609 Other forms of dyspnea: Secondary | ICD-10-CM | POA: Diagnosis not present

## 2017-03-07 NOTE — Telephone Encounter (Signed)
It would be fine to go ahead and extend physical therapy

## 2017-03-07 NOTE — Progress Notes (Signed)
   Subjective:    Patient ID: [name], male    DOB: 1932-09-22, 83 y.o.   MRN: [id number]  HPI Patient arrives for follow up from recent hospitalization. Patient relates he feels age referral blade under good control takes his medicine. Denies any wheezing difficulty breathing but he does get short of breath when he moves around denies any low sugar spells. States his appetite is been doing okay. Does relate severe lumbar pain radiates into legs pain specialist [place] posted see him soon patient not a great candidate for surgery. Denies any rectal bleeding denies hematuria. States neuropathy in his feet stable. PMH benign  Review of Systems Denies any chest tightness pressure pain shortness breath nausea, diarrhea and. He does get somewhat short of breath with activity denies dizziness energy level subpar    Objective:   Physical Exam No cracklesIn the lungs rest real rate normal heart irregular rate is controlled pulses normal venous insufficiency noted in the feet no ulcers neuropathy in the feet is noted extremities trace edema  25 minutes was spent with the patient. Greater than half the time was spent in discussion and answering questions and counseling regarding the issues that the patient came in for today.      Assessment & Plan:  CHF stable continue current measures way on a regular basis if significant weight gain over the course of several days notify us immediately  Low blood pressure but already on low doses of medicine stick with as is  Diabetic polyneuropathy proper foot care recommended  Lumbar pain seen specialist they may recommend injections for spinal stenosis  Dyspnea on exertion probably related into the CHF but stable currently

## 2017-03-07 NOTE — Telephone Encounter (Signed)
Discussed with Stacy.

## 2017-03-07 NOTE — Telephone Encounter (Signed)
Requesting extension for PT for 4 more weeks.

## 2017-03-09 DIAGNOSIS — I5023 Acute on chronic systolic (congestive) heart failure: Secondary | ICD-10-CM | POA: Diagnosis not present

## 2017-03-09 DIAGNOSIS — I482 Chronic atrial fibrillation: Secondary | ICD-10-CM | POA: Diagnosis not present

## 2017-03-09 DIAGNOSIS — E1122 Type 2 diabetes mellitus with diabetic chronic kidney disease: Secondary | ICD-10-CM | POA: Diagnosis not present

## 2017-03-09 DIAGNOSIS — I130 Hypertensive heart and chronic kidney disease with heart failure and stage 1 through stage 4 chronic kidney disease, or unspecified chronic kidney disease: Secondary | ICD-10-CM | POA: Diagnosis not present

## 2017-03-14 DIAGNOSIS — I5023 Acute on chronic systolic (congestive) heart failure: Secondary | ICD-10-CM | POA: Diagnosis not present

## 2017-03-14 DIAGNOSIS — Z7984 Long term (current) use of oral hypoglycemic drugs: Secondary | ICD-10-CM | POA: Diagnosis not present

## 2017-03-14 DIAGNOSIS — E785 Hyperlipidemia, unspecified: Secondary | ICD-10-CM | POA: Diagnosis not present

## 2017-03-14 DIAGNOSIS — I130 Hypertensive heart and chronic kidney disease with heart failure and stage 1 through stage 4 chronic kidney disease, or unspecified chronic kidney disease: Secondary | ICD-10-CM | POA: Diagnosis not present

## 2017-03-14 DIAGNOSIS — Z7951 Long term (current) use of inhaled steroids: Secondary | ICD-10-CM | POA: Diagnosis not present

## 2017-03-14 DIAGNOSIS — N183 Chronic kidney disease, stage 3 (moderate): Secondary | ICD-10-CM | POA: Diagnosis not present

## 2017-03-14 DIAGNOSIS — Z79891 Long term (current) use of opiate analgesic: Secondary | ICD-10-CM | POA: Diagnosis not present

## 2017-03-14 DIAGNOSIS — E1122 Type 2 diabetes mellitus with diabetic chronic kidney disease: Secondary | ICD-10-CM | POA: Diagnosis not present

## 2017-03-14 DIAGNOSIS — I482 Chronic atrial fibrillation: Secondary | ICD-10-CM | POA: Diagnosis not present

## 2017-03-14 DIAGNOSIS — Z7901 Long term (current) use of anticoagulants: Secondary | ICD-10-CM | POA: Diagnosis not present

## 2017-03-14 DIAGNOSIS — I350 Nonrheumatic aortic (valve) stenosis: Secondary | ICD-10-CM | POA: Diagnosis not present

## 2017-03-15 ENCOUNTER — Telehealth: Payer: Self-pay | Admitting: Family Medicine

## 2017-03-15 NOTE — Telephone Encounter (Signed)
[name] calling to say that one of the CNA's that has been coming in to care for [name] [name] has been diagnosed with the shingles.  Is there anything they need to do or be worried about?  Is he "up to date on his shots"?  [phone]

## 2017-03-15 NOTE — Telephone Encounter (Signed)
zostavax in 2009

## 2017-03-15 NOTE — Telephone Encounter (Signed)
Cant catch shingles and likelihood of catching chicken pox very low but should use precautions to avoid direct skin contact, has had varicella vaccine

## 2017-03-15 NOTE — Telephone Encounter (Signed)
Family notified that doctor stated Can't catch shingles and likelihood of catching chicken pox very low but should use precautions to avoid direct skin contact, has had varicella vaccine. Family verbalized understanding.

## 2017-03-20 ENCOUNTER — Ambulatory Visit: Payer: PPO | Admitting: Family Medicine

## 2017-03-21 DIAGNOSIS — E119 Type 2 diabetes mellitus without complications: Secondary | ICD-10-CM | POA: Diagnosis not present

## 2017-03-24 DIAGNOSIS — I350 Nonrheumatic aortic (valve) stenosis: Secondary | ICD-10-CM | POA: Diagnosis not present

## 2017-03-24 DIAGNOSIS — Z7901 Long term (current) use of anticoagulants: Secondary | ICD-10-CM | POA: Diagnosis not present

## 2017-03-24 DIAGNOSIS — Z7984 Long term (current) use of oral hypoglycemic drugs: Secondary | ICD-10-CM | POA: Diagnosis not present

## 2017-03-24 DIAGNOSIS — Z79891 Long term (current) use of opiate analgesic: Secondary | ICD-10-CM | POA: Diagnosis not present

## 2017-03-24 DIAGNOSIS — N183 Chronic kidney disease, stage 3 (moderate): Secondary | ICD-10-CM | POA: Diagnosis not present

## 2017-03-24 DIAGNOSIS — I5023 Acute on chronic systolic (congestive) heart failure: Secondary | ICD-10-CM | POA: Diagnosis not present

## 2017-03-24 DIAGNOSIS — Z7951 Long term (current) use of inhaled steroids: Secondary | ICD-10-CM | POA: Diagnosis not present

## 2017-03-24 DIAGNOSIS — I482 Chronic atrial fibrillation: Secondary | ICD-10-CM | POA: Diagnosis not present

## 2017-03-24 DIAGNOSIS — I130 Hypertensive heart and chronic kidney disease with heart failure and stage 1 through stage 4 chronic kidney disease, or unspecified chronic kidney disease: Secondary | ICD-10-CM | POA: Diagnosis not present

## 2017-03-24 DIAGNOSIS — E1122 Type 2 diabetes mellitus with diabetic chronic kidney disease: Secondary | ICD-10-CM | POA: Diagnosis not present

## 2017-03-24 DIAGNOSIS — E785 Hyperlipidemia, unspecified: Secondary | ICD-10-CM | POA: Diagnosis not present

## 2017-03-27 ENCOUNTER — Encounter: Payer: Self-pay | Admitting: Family Medicine

## 2017-03-27 ENCOUNTER — Ambulatory Visit (INDEPENDENT_AMBULATORY_CARE_PROVIDER_SITE_OTHER): Payer: PPO | Admitting: Family Medicine

## 2017-03-27 VITALS — BP 118/82 | Ht 73.0 in | Wt 216.0 lb

## 2017-03-27 DIAGNOSIS — E785 Hyperlipidemia, unspecified: Secondary | ICD-10-CM | POA: Diagnosis not present

## 2017-03-27 DIAGNOSIS — E0842 Diabetes mellitus due to underlying condition with diabetic polyneuropathy: Secondary | ICD-10-CM

## 2017-03-27 DIAGNOSIS — E119 Type 2 diabetes mellitus without complications: Secondary | ICD-10-CM

## 2017-03-27 DIAGNOSIS — Z7901 Long term (current) use of anticoagulants: Secondary | ICD-10-CM | POA: Diagnosis not present

## 2017-03-27 LAB — POCT INR: INR: 2.7

## 2017-03-27 MED ORDER — HYDROCODONE-ACETAMINOPHEN 5-325 MG PO TABS: 1.0000 | ORAL_TABLET | Freq: Two times a day (BID) | ORAL | 0 refills | Status: DC | PRN

## 2017-03-27 NOTE — Patient Instructions (Signed)
As for the Lasix I would recommend taking one every other day as long as he does not have a lot of swelling in the legs. The reason for this change is because his blood pressure is on the low when.  These follow-up within 6 weeks.  Please use pain medicine twice daily as necessary to help with her leg pain  Please try to get up consistently by 9 or 10 AM.  Please try to do your lab work somewhere in the next couple weeks

## 2017-03-27 NOTE — Progress Notes (Signed)
   Subjective:    Patient ID: [name] [name], male    DOB: Apr 13, 1932, 83 y.o.   MRN: [id number]  HPI Patient does take his Coumadin half a regular basis he denies bleeding problems denies chest tightness pressure pain he does get short of breath with activity he does not do much walking he often sleeps in in the morning and has a hard time going to sleep at night as a result he tends to spend most of his day in bed he denies excessive thirst or urination he does have a history of heart disease he also has bilateral foot pain and discomfort that is related to neuropathy in his feet Patient arrives for a follow up on blood pressure. Patient with recent HgbA1c 7.6.  Results for orders placed or performed in visit on 03/27/17  POCT INR  Result Value Ref Range   INR 2.7    Review of Systems  Constitutional: Negative for activity change.  HENT: Negative for congestion.   Respiratory: Negative for cough.   Gastrointestinal: Negative for abdominal pain and vomiting.  Neurological: Negative for weakness.  Psychiatric/Behavioral: Negative for confusion.      Objective:   Physical Exam  Constitutional: He appears well-developed.  HENT:  Head: Normocephalic.  Mouth/Throat: Oropharynx is clear and moist. No oropharyngeal exudate.  Neck: Normal range of motion.  Cardiovascular: Normal rate, regular rhythm and normal heart sounds.   No murmur heard. Pulmonary/Chest: Effort normal and breath sounds normal. He has no wheezes.  Lymphadenopathy:    He has no cervical adenopathy.  Neurological: He exhibits normal muscle tone.  Skin: Skin is warm and dry.  Nursing note and vitals reviewed.   Diabetic neuropathy on foot exam venous stasis noted no ulcers 25 minutes was spent with the patient. Greater than half the time was spent in discussion and answering questions and counseling regarding the issues that the patient came in for today.      Assessment & Plan:  Coumadin-stable check  monthly Congestive heart failure stable Hypotension decrease furosemide use every other day if increased swelling to notify us Significant pain sciatica with spinal stenosis hydrocodone no greater than twice per day cautioned drowsiness Insomnia-poor sleeping routine-importance of going to bed better our in getting up every morning between 9 AM and no later than 10 AM. When the patient follows up in 6 weeks we will discuss end-of-life issues Diabetes-A1c recently stable Check CBC because of Coumadin and history of anemia and fatigue Hyperlipidemia check lipid profile Diabetes exam completed today has neuropathy check urine micro-protein

## 2017-03-28 DIAGNOSIS — E119 Type 2 diabetes mellitus without complications: Secondary | ICD-10-CM | POA: Diagnosis not present

## 2017-03-28 DIAGNOSIS — E785 Hyperlipidemia, unspecified: Secondary | ICD-10-CM | POA: Diagnosis not present

## 2017-03-28 DIAGNOSIS — Z7901 Long term (current) use of anticoagulants: Secondary | ICD-10-CM | POA: Diagnosis not present

## 2017-03-29 ENCOUNTER — Encounter: Payer: Self-pay | Admitting: Family Medicine

## 2017-03-29 LAB — MICROALBUMIN / CREATININE URINE RATIO
Creatinine, Urine: 243.7 mg/dL
MICROALBUM., U, RANDOM: 32.6 ug/mL
Microalb/Creat Ratio: 13.4 mg/g creat (ref 0.0–30.0)

## 2017-03-29 LAB — CBC WITH DIFFERENTIAL/PLATELET
BASOS ABS: 0.1 10*3/uL (ref 0.0–0.2)
Basos: 1 %
EOS (ABSOLUTE): 0.3 10*3/uL (ref 0.0–0.4)
Eos: 4 %
HEMATOCRIT: 42.6 % (ref 37.5–51.0)
Hemoglobin: 14.4 g/dL (ref 13.0–17.7)
Immature Grans (Abs): 0 10*3/uL (ref 0.0–0.1)
Immature Granulocytes: 1 %
LYMPHS ABS: 1 10*3/uL (ref 0.7–3.1)
Lymphs: 15 %
MCH: 31.2 pg (ref 26.6–33.0)
MCHC: 33.8 g/dL (ref 31.5–35.7)
MCV: 92 fL (ref 79–97)
MONOS ABS: 0.3 10*3/uL (ref 0.1–0.9)
Monocytes: 5 %
Neutrophils Absolute: 4.8 10*3/uL (ref 1.4–7.0)
Neutrophils: 74 %
Platelets: 202 10*3/uL (ref 150–379)
RBC: 4.62 x10E6/uL (ref 4.14–5.80)
RDW: 14.6 % (ref 12.3–15.4)
WBC: 6.4 10*3/uL (ref 3.4–10.8)

## 2017-03-29 LAB — LIPID PANEL
CHOL/HDL RATIO: 4 ratio (ref 0.0–5.0)
Cholesterol, Total: 159 mg/dL (ref 100–199)
HDL: 40 mg/dL (ref >39)
LDL Calculated: 87 mg/dL (ref 0–99)
Triglycerides: 160 mg/dL — ABNORMAL HIGH (ref 0–149)
VLDL CHOLESTEROL CAL: 32 mg/dL (ref 5–40)

## 2017-03-30 DIAGNOSIS — I350 Nonrheumatic aortic (valve) stenosis: Secondary | ICD-10-CM | POA: Diagnosis not present

## 2017-03-30 DIAGNOSIS — Z7984 Long term (current) use of oral hypoglycemic drugs: Secondary | ICD-10-CM | POA: Diagnosis not present

## 2017-03-30 DIAGNOSIS — N183 Chronic kidney disease, stage 3 (moderate): Secondary | ICD-10-CM | POA: Diagnosis not present

## 2017-03-30 DIAGNOSIS — E1122 Type 2 diabetes mellitus with diabetic chronic kidney disease: Secondary | ICD-10-CM | POA: Diagnosis not present

## 2017-03-30 DIAGNOSIS — E785 Hyperlipidemia, unspecified: Secondary | ICD-10-CM | POA: Diagnosis not present

## 2017-03-30 DIAGNOSIS — Z79891 Long term (current) use of opiate analgesic: Secondary | ICD-10-CM | POA: Diagnosis not present

## 2017-03-30 DIAGNOSIS — Z7951 Long term (current) use of inhaled steroids: Secondary | ICD-10-CM | POA: Diagnosis not present

## 2017-03-30 DIAGNOSIS — I130 Hypertensive heart and chronic kidney disease with heart failure and stage 1 through stage 4 chronic kidney disease, or unspecified chronic kidney disease: Secondary | ICD-10-CM | POA: Diagnosis not present

## 2017-03-30 DIAGNOSIS — I482 Chronic atrial fibrillation: Secondary | ICD-10-CM | POA: Diagnosis not present

## 2017-03-30 DIAGNOSIS — I5023 Acute on chronic systolic (congestive) heart failure: Secondary | ICD-10-CM | POA: Diagnosis not present

## 2017-03-30 DIAGNOSIS — Z7901 Long term (current) use of anticoagulants: Secondary | ICD-10-CM | POA: Diagnosis not present

## 2017-04-04 DIAGNOSIS — I130 Hypertensive heart and chronic kidney disease with heart failure and stage 1 through stage 4 chronic kidney disease, or unspecified chronic kidney disease: Secondary | ICD-10-CM | POA: Diagnosis not present

## 2017-04-04 DIAGNOSIS — I5023 Acute on chronic systolic (congestive) heart failure: Secondary | ICD-10-CM | POA: Diagnosis not present

## 2017-04-04 DIAGNOSIS — Z7984 Long term (current) use of oral hypoglycemic drugs: Secondary | ICD-10-CM | POA: Diagnosis not present

## 2017-04-04 DIAGNOSIS — Z7951 Long term (current) use of inhaled steroids: Secondary | ICD-10-CM | POA: Diagnosis not present

## 2017-04-04 DIAGNOSIS — N183 Chronic kidney disease, stage 3 (moderate): Secondary | ICD-10-CM | POA: Diagnosis not present

## 2017-04-04 DIAGNOSIS — Z7901 Long term (current) use of anticoagulants: Secondary | ICD-10-CM | POA: Diagnosis not present

## 2017-04-04 DIAGNOSIS — E1122 Type 2 diabetes mellitus with diabetic chronic kidney disease: Secondary | ICD-10-CM | POA: Diagnosis not present

## 2017-04-04 DIAGNOSIS — E785 Hyperlipidemia, unspecified: Secondary | ICD-10-CM | POA: Diagnosis not present

## 2017-04-04 DIAGNOSIS — I350 Nonrheumatic aortic (valve) stenosis: Secondary | ICD-10-CM | POA: Diagnosis not present

## 2017-04-04 DIAGNOSIS — Z79891 Long term (current) use of opiate analgesic: Secondary | ICD-10-CM | POA: Diagnosis not present

## 2017-04-04 DIAGNOSIS — I482 Chronic atrial fibrillation: Secondary | ICD-10-CM | POA: Diagnosis not present

## 2017-04-06 DIAGNOSIS — E785 Hyperlipidemia, unspecified: Secondary | ICD-10-CM | POA: Diagnosis not present

## 2017-04-06 DIAGNOSIS — I5023 Acute on chronic systolic (congestive) heart failure: Secondary | ICD-10-CM | POA: Diagnosis not present

## 2017-04-06 DIAGNOSIS — Z7951 Long term (current) use of inhaled steroids: Secondary | ICD-10-CM | POA: Diagnosis not present

## 2017-04-06 DIAGNOSIS — I350 Nonrheumatic aortic (valve) stenosis: Secondary | ICD-10-CM | POA: Diagnosis not present

## 2017-04-06 DIAGNOSIS — N183 Chronic kidney disease, stage 3 (moderate): Secondary | ICD-10-CM | POA: Diagnosis not present

## 2017-04-06 DIAGNOSIS — Z79891 Long term (current) use of opiate analgesic: Secondary | ICD-10-CM | POA: Diagnosis not present

## 2017-04-06 DIAGNOSIS — I482 Chronic atrial fibrillation: Secondary | ICD-10-CM | POA: Diagnosis not present

## 2017-04-06 DIAGNOSIS — Z7984 Long term (current) use of oral hypoglycemic drugs: Secondary | ICD-10-CM | POA: Diagnosis not present

## 2017-04-06 DIAGNOSIS — I130 Hypertensive heart and chronic kidney disease with heart failure and stage 1 through stage 4 chronic kidney disease, or unspecified chronic kidney disease: Secondary | ICD-10-CM | POA: Diagnosis not present

## 2017-04-06 DIAGNOSIS — Z7901 Long term (current) use of anticoagulants: Secondary | ICD-10-CM | POA: Diagnosis not present

## 2017-04-06 DIAGNOSIS — E1122 Type 2 diabetes mellitus with diabetic chronic kidney disease: Secondary | ICD-10-CM | POA: Diagnosis not present

## 2017-04-25 ENCOUNTER — Encounter (HOSPITAL_COMMUNITY): Payer: Self-pay | Admitting: Internal Medicine

## 2017-04-25 ENCOUNTER — Ambulatory Visit (HOSPITAL_COMMUNITY)
Admission: RE | Admit: 2017-04-25 | Discharge: 2017-04-25 | Disposition: A | Discharge status: Home / Self Care | Payer: PPO | Source: Ambulatory Visit | Attending: Internal Medicine | Admitting: Internal Medicine

## 2017-04-25 VITALS — BP 104/56 | HR 67 | Wt 219.2 lb

## 2017-04-25 DIAGNOSIS — Z87891 Personal history of nicotine dependence: Secondary | ICD-10-CM | POA: Diagnosis not present

## 2017-04-25 DIAGNOSIS — N182 Chronic kidney disease, stage 2 (mild): Secondary | ICD-10-CM | POA: Insufficient documentation

## 2017-04-25 DIAGNOSIS — Z952 Presence of prosthetic heart valve: Secondary | ICD-10-CM | POA: Diagnosis not present

## 2017-04-25 DIAGNOSIS — I428 Other cardiomyopathies: Secondary | ICD-10-CM | POA: Insufficient documentation

## 2017-04-25 DIAGNOSIS — I679 Cerebrovascular disease, unspecified: Secondary | ICD-10-CM | POA: Diagnosis not present

## 2017-04-25 DIAGNOSIS — I2510 Atherosclerotic heart disease of native coronary artery without angina pectoris: Secondary | ICD-10-CM | POA: Insufficient documentation

## 2017-04-25 DIAGNOSIS — I130 Hypertensive heart and chronic kidney disease with heart failure and stage 1 through stage 4 chronic kidney disease, or unspecified chronic kidney disease: Secondary | ICD-10-CM | POA: Diagnosis not present

## 2017-04-25 DIAGNOSIS — I6523 Occlusion and stenosis of bilateral carotid arteries: Secondary | ICD-10-CM | POA: Diagnosis not present

## 2017-04-25 DIAGNOSIS — I4891 Unspecified atrial fibrillation: Secondary | ICD-10-CM | POA: Diagnosis not present

## 2017-04-25 DIAGNOSIS — I5022 Chronic systolic (congestive) heart failure: Secondary | ICD-10-CM | POA: Insufficient documentation

## 2017-04-25 DIAGNOSIS — I352 Nonrheumatic aortic (valve) stenosis with insufficiency: Secondary | ICD-10-CM | POA: Insufficient documentation

## 2017-04-25 DIAGNOSIS — E1151 Type 2 diabetes mellitus with diabetic peripheral angiopathy without gangrene: Secondary | ICD-10-CM | POA: Diagnosis not present

## 2017-04-25 DIAGNOSIS — I340 Nonrheumatic mitral (valve) insufficiency: Secondary | ICD-10-CM | POA: Diagnosis not present

## 2017-04-25 DIAGNOSIS — I350 Nonrheumatic aortic (valve) stenosis: Secondary | ICD-10-CM

## 2017-04-25 DIAGNOSIS — Z953 Presence of xenogenic heart valve: Secondary | ICD-10-CM | POA: Diagnosis not present

## 2017-04-25 DIAGNOSIS — E785 Hyperlipidemia, unspecified: Secondary | ICD-10-CM | POA: Diagnosis not present

## 2017-04-25 DIAGNOSIS — K219 Gastro-esophageal reflux disease without esophagitis: Secondary | ICD-10-CM | POA: Diagnosis not present

## 2017-04-25 DIAGNOSIS — M549 Dorsalgia, unspecified: Secondary | ICD-10-CM | POA: Diagnosis not present

## 2017-04-25 DIAGNOSIS — I482 Chronic atrial fibrillation: Secondary | ICD-10-CM | POA: Diagnosis not present

## 2017-04-25 DIAGNOSIS — Z7901 Long term (current) use of anticoagulants: Secondary | ICD-10-CM | POA: Insufficient documentation

## 2017-04-25 DIAGNOSIS — E1122 Type 2 diabetes mellitus with diabetic chronic kidney disease: Secondary | ICD-10-CM | POA: Insufficient documentation

## 2017-04-25 DIAGNOSIS — Z79899 Other long term (current) drug therapy: Secondary | ICD-10-CM | POA: Diagnosis not present

## 2017-04-25 MED ORDER — FUROSEMIDE 20 MG PO TABS: 20.0000 mg | ORAL_TABLET | ORAL | 6 refills | Status: DC

## 2017-04-25 NOTE — Patient Instructions (Signed)
Decrease Furosemide (Lasix) to 20 mg on Monday and Friday ONLY  We will contact you in 4 months to schedule your next appointment.

## 2017-04-25 NOTE — Progress Notes (Signed)
ADVANCED HF CLINIC NOTE  Primary Cardiologist: Dr Harl Bowie   HPI:  Mr. Grandfield is an 83 year old male with aortic stenosis, chronic atrial fibrillation, hypertension, systolic HF, type II diabetes mellitus, hyperlipidemia, and cerebrovascular disease who underwent recent R CEA and TAVR (/03/2016).   He presents for post-hospital f/u.   He had echo on 06/01/2016 which revealed severe low gradient AS with LVEF 35% low ejection fraction aortic stenosis. TEE revealed moderate to severe left ventricular systolic dysfunction with ejection fraction estimated 30-35%. There was severe aortic stenosis with mild aortic insufficiency. The dimensionless velocity ratio was measured 0.2 with peak velocity across the aortic valve measured 3.2 m/s corresponding to mean transvalvular gradient estimated 22 mmHg.Diagnostic cardiac catheterization revealed moderate coronary artery disease with 75% stenosis of the mid left anterior descending coronary artery, 80% stenosis of the distal left circumflex coronary artery, and otherwise moderate nonobstructive disease. Attempts to cross the aortic valve were unsuccessful at catheterization. Right heart catheterization revealed moderate pulmonary hypertension with PA pressures measured 52/23, mean pulmonary A wedge pressure measured 25 mmHg, central venous pressure 11 mmHg, and lowcardiac output (3.2 L/m) and mixed venous oxygen saturation (52%).    He presented acutely to the emergency department 06/28/2016 with severe respiratory distress due to cardiogenic shock and rapid AF. He was admitted to the floor and the transferred to the CCU. He was treated with milrinone, IV amiodarone and lasix. Work-up also revealed high-grade R carotid stenosis He was seen by TCTS and not felt to be candidate for AVR/CABG. VVS also consulted. He eventually underwent R carotid stent followed a week later by successful TAVR by [name]. Milrinone was weaned and he was ambulated by PT and  cardiac rehab. He was discharged to [facility].   Admitted April 02/21/17-02/24/17 with altered mental status, concern for sepsis with elevated lactic acid, UA and blood cultures were negative so it was felt that he was dehydrated. Also developed atrial fibrillation with RVR. Rate improved with IV diltiazem, he was continued on coumadin.   Echo 10/17 EF 35-40%  He returns today for HF follow up. Does not have much energy. No SOB with ADL's, can walk very little around his house, but when he does he is not SOB. Denise chest pain. Not weighing at home. His weight is stable by our records. Taking all his medications with compliance. Drinking a lot of water, eating some high salt foods. His main compliant is back pain, going to see ortho this week about possibly getting an injection.     ROS: All systems negative except as listed in HPI, PMH and Problem List. Current Outpatient Prescriptions on File Prior to Encounter  Medication Sig Dispense Refill  . furosemide (LASIX) 20 MG tablet Take 1 tablet (20 mg total) by mouth daily. 30 tablet 6  . HYDROcodone-acetaminophen (NORCO/VICODIN) 5-325 MG tablet Take 1 tablet by mouth 2 (two) times daily as needed. 60 tablet 0  . losartan (COZAAR) 25 MG tablet Take 0.5 tablets (12.5 mg total) by mouth daily. 30 tablet 0  . nortriptyline (PAMELOR) 10 MG capsule TAKE 1 TO 2 CAPSULES AT BEDTIME FOR BURNING IN FEET. 60 capsule 5  . rosuvastatin (CRESTOR) 5 MG tablet TAKE (1) TABLET BY MOUTH ONCE DAILY. 30 tablet 5  . sitaGLIPtin (JANUVIA) 50 MG tablet Take 1 tablet (50 mg total) by mouth daily. 30 tablet 1  . Sodium Phosphates (FLEET ENEMA RE) Place 1 Dose rectally daily as needed (constipation). Via rectum prn daily for constipation     .  spironolactone (ALDACTONE) 25 MG tablet Take 0.5 tablets (12.5 mg total) by mouth daily. 30 tablet 5  . traZODone (DESYREL) 100 MG tablet TAKE 1 TABLET BY MOUTH AT BEDTIME. 30 tablet 5  . warfarin (COUMADIN) 6 MG tablet TAKE 1  TABLET AS DIRECTED. (Patient taking differently: TAKE 6MG  ON SUNDAYS, MONDAYS, WEDNESDAYS, AND FRIDAYS. TAKE 3MG  ON TUESDAYS, THURSDAYS, AND SATURDAYS) 30 tablet 5   No current facility-administered medications on file prior to encounter.    SH:  Social History   Social History  . Marital status: Married    Spouse name: N/A  . Number of children: 1  . Years of education: N/A   Occupational History  . Retired    Social History Main Topics  . Smoking status: Former Smoker    Packs/day: 1.00    Years: 20.00    Types: Cigarettes    Start date: 07/04/1950    Quit date: 04/25/1992  . Smokeless tobacco: Current User    Types: Chew  . Alcohol use No  . Drug use: No  . Sexual activity: Not Currently   Other Topics Concern  . Not on file   Social History Narrative  . No narrative on file    FH:  Family History  Problem Relation Age of Onset  . Stroke Mother   . Diabetes Father   . Colon cancer Neg Hx     Past Medical History:  Diagnosis Date  . Aortic stenosis    a. mod-sev by echo 05/2016.  . Asthma   . Cerebrovascular disease 07/2010   TIA; carotid ultrasound in 07/2010-significant bilateral plaque without focal internal carotid artery stenosis; MRI -encephalomalacia left temporal and right temporal lobes; small inferior right cerebellar infarct; small vessel disease  . Chronic atrial fibrillation (HCC)    Paroxysmal; Echocardiogram in 2007-normal EF; mild LVH; left atrial enlargement; mild stenosis and minimal AI; negative stress nuclear study in 2008  . Chronic systolic CHF (congestive heart failure) ([place])    a. dx 05/2016 - EF 35-40%, diffuse HK, mod-severe AS, mod gradient, severe AVA VTI likely due to decreased cardiac output in setting of systolic dysfsunction and significant mitral regurgitation, mild MR, mod-severe MR, severe LAE, mild-mod RV dilation, mild RAE, mild-mod TR, mod PASP 27mmHg.  . CKD (chronic kidney disease), stage II   . Degenerative joint disease     feet and legs  . Diabetes mellitus    no insulin; A1c of 6.6 in 2005  . Dizziness    occurs daily,especially in am  . Exertional dyspnea   . Gastroesophageal reflux disease   . Hepatic steatosis   . History of noncompliance with medical treatment   . Hyperlipidemia    adverse reactions to statins and niacin  . Hypertension    Borderline  . Irregular heartbeat   . Mitral regurgitation    a. mod-sev by echo 05/2016  . Peripheral vascular disease ([place])   . Renal insufficiency   . S/P TAVR (transcatheter aortic valve replacement) 07/19/2016   29 mm Edwards Sapien 3 transcatheter heart valve placed via left percutaneous transfemoral approach  . Temporal arteritis ([place])   . Tricuspid regurgitation    a. mild-mod TR by echo 05/2016     Vitals:   04/25/17 1406  BP: (!) 104/56  Pulse: 67  SpO2: 100%  Weight: 219 lb 4 oz (99.5 kg)    PHYSICAL EXAM:  General: Elderly male, NAD.  HEENT: normal Neck: supple. No JVP. Carotids 2+ bilaterally; no bruits. No lymphadenopathy  or thryomegaly appreciated. Cor: PMI normal. Irregularly irregular rhythm. No M/R/G. Lungs: Clear bilaterally. Normal effort.  Abdomen: soft, nontender, nondistended. No hepatosplenomegaly. No bruits or masses. Good bowel sounds. Extremities: no cyanosis, clubbing, rash,No LE edema. Ecchymosis on bilateral upper extremities.  Neuro: alert & orientedx3, cranial nerves grossly intact. Moves all 4 extremities w/o difficulty. Affect pleasant.   ASSESSMENT & PLAN: 1. Chronic systolic HF: NICM, likely related to critical AS. Echo 01/2017 EF 40%, Echo 10/17 EF 35-40%  - NYHA II - Volume status stable. - Continue lasix 20mg  daily.  - Continue losartan 12.5mg  daily.  - Continue Spiro 25mg  daily.  2. Critical AS --S/p TAVR 9/17.  - Stable on last Echo.  3. Chronic AF - Rate controlled today.  - Continue warfarin for anticoagulation  4. Carotid stenosis --s/p R carotid stent in Aug. 2017 - No ASA with warfarin  -  Continue statin 5. CAD - Denies chest pain  - No ASA with warfarin.  6. Back pain - Ok to stop coumadin as needed for back injection.    Follow up in 4 months.   Arbutus Leas, NP 2:22 PM   Patient seen and examined with Jettie Booze, NP. We discussed all aspects of the encounter. I agree with the assessment and plan as stated above.   He is s/p TAVR. Overall stable from HF perspective. But remains weak. EF 40%. Volume status seems to be low with occasional orthostatic symptoms. Will change lasix to 20mg  M & F. Watch renal function closely. Ok to hold coumadin for back injection.   [name], MD  2:38 PM

## 2017-04-25 NOTE — Addendum Note (Signed)
Encounter addended by: Scarlette Calico, RN on: 04/25/2017  2:44 PM<BR>    Actions taken: Order list changed, Sign clinical note

## 2017-04-27 ENCOUNTER — Ambulatory Visit (INDEPENDENT_AMBULATORY_CARE_PROVIDER_SITE_OTHER): Payer: PPO

## 2017-04-27 DIAGNOSIS — Z7901 Long term (current) use of anticoagulants: Secondary | ICD-10-CM

## 2017-04-27 DIAGNOSIS — M5441 Lumbago with sciatica, right side: Secondary | ICD-10-CM | POA: Diagnosis not present

## 2017-04-27 DIAGNOSIS — G8929 Other chronic pain: Secondary | ICD-10-CM | POA: Diagnosis not present

## 2017-04-27 LAB — POCT INR: INR: 2.8

## 2017-04-27 NOTE — Patient Instructions (Signed)
Take one tablet on sundays, mondays, wednesdays, fridays, and take one half tablet on tuesdays, thursdays, and saturdays. Recheck INR in 4 weeks 

## 2017-05-01 ENCOUNTER — Other Ambulatory Visit: Payer: Self-pay | Admitting: Family Medicine

## 2017-05-08 ENCOUNTER — Ambulatory Visit: Payer: PPO | Admitting: Family Medicine

## 2017-05-11 ENCOUNTER — Other Ambulatory Visit: Payer: Self-pay | Admitting: Family Medicine

## 2017-05-25 ENCOUNTER — Ambulatory Visit (INDEPENDENT_AMBULATORY_CARE_PROVIDER_SITE_OTHER): Payer: PPO | Admitting: *Deleted

## 2017-05-25 DIAGNOSIS — Z7901 Long term (current) use of anticoagulants: Secondary | ICD-10-CM

## 2017-05-25 LAB — POCT INR: INR: 2

## 2017-06-09 ENCOUNTER — Other Ambulatory Visit: Payer: Self-pay | Admitting: Family Medicine

## 2017-06-09 NOTE — Telephone Encounter (Signed)
Last seen 03/27/2017

## 2017-06-19 ENCOUNTER — Encounter (INDEPENDENT_AMBULATORY_CARE_PROVIDER_SITE_OTHER): Payer: Self-pay

## 2017-06-19 ENCOUNTER — Ambulatory Visit (INDEPENDENT_AMBULATORY_CARE_PROVIDER_SITE_OTHER): Payer: PPO | Admitting: Cardiovascular Disease

## 2017-06-19 ENCOUNTER — Ambulatory Visit (HOSPITAL_COMMUNITY): Payer: PPO | Attending: Internal Medicine

## 2017-06-19 ENCOUNTER — Encounter: Payer: Self-pay | Admitting: Cardiovascular Disease

## 2017-06-19 ENCOUNTER — Other Ambulatory Visit: Payer: Self-pay

## 2017-06-19 VITALS — BP 114/60 | HR 52 | Ht 73.0 in | Wt 225.8 lb

## 2017-06-19 DIAGNOSIS — Z952 Presence of prosthetic heart valve: Secondary | ICD-10-CM | POA: Diagnosis not present

## 2017-06-19 DIAGNOSIS — I350 Nonrheumatic aortic (valve) stenosis: Secondary | ICD-10-CM | POA: Diagnosis not present

## 2017-06-19 DIAGNOSIS — I340 Nonrheumatic mitral (valve) insufficiency: Secondary | ICD-10-CM | POA: Diagnosis not present

## 2017-06-19 LAB — ECHOCARDIOGRAM COMPLETE
AO mean calculated velocity dopler: 109 cm/s
AOVTI: 33.7 cm
AV Area VTI: 1.3 cm2
AV Area mean vel: 1.32 cm2
AV Mean grad: 6 mmHg
AV VEL mean LVOT/AV: 0.42
AV area mean vel ind: 0.58 cm2/m2
AV vel: 1.33
AVA: 1.33 cm2
AVAREAVTIIND: 0.58 cm2/m2
AVPG: 9 mmHg
AVPKVEL: 151 cm/s
Ao pk vel: 0.41 m/s
CHL CUP AV PEAK INDEX: 0.57
CHL CUP AV VALUE AREA INDEX: 0.58
CHL CUP DOP CALC LVOT VTI: 14.3 cm
CHL CUP MV DEC (S): 301
CHL CUP RV SYS PRESS: 38 mmHg
E/e' ratio: 13.43
EWDT: 301 ms
FS: 17 % — AB (ref 28–44)
IV/PV OW: 0.81
LA ID, A-P, ES: 51 mm
LA vol index: 42.1 mL/m2
LADIAMINDEX: 2.23 cm/m2
LAVOL: 96.1 mL
LAVOLA4C: 90 mL
LEFT ATRIUM END SYS DIAM: 51 mm
LV E/e' medial: 13.43
LV PW d: 12.9 mm — AB (ref 0.6–1.1)
LV e' LATERAL: 9.68 cm/s
LVEEAVG: 13.43
LVOT area: 3.14 cm2
LVOT peak VTI: 0.42 cm
LVOT peak vel: 62.4 cm/s
LVOTD: 20 mm
LVOTSV: 45 mL
Lateral S' vel: 9.25 cm/s
MV pk E vel: 130 m/s
MVPG: 7 mmHg
PISA EROA: 0.3 cm2
RV TAPSE: 17.8 mm
Reg peak vel: 296 cm/s
TDI e' lateral: 9.68
TDI e' medial: 6.31
TR max vel: 296 cm/s
TVPG: 296 mmHg
VTI: 167 cm

## 2017-06-19 NOTE — Progress Notes (Signed)
Cardiology Office Note Date:  06/19/2017   ID:  [name] [name], DOB 24-Apr-1932, MRN [id number]  PCP:  Kathyrn Drown, MD  Cardiologist:  Sherren Mocha, MD    Chief Complaint  Patient presents with  . Aortic Stenosis    s/p TAVR     History of Present Illness: [name] [name] is a 83 y.o. male who presents for TAVR after undergoing percutaneous transfemoral TAVR 08/08/2016 For treatment of severe low flow low gradient aortic stenosis. The patient was treated with a 29 mm S3 valve.  The patient is here with his son today. He hasn't had any recent cardiac problems. He was last seen by Dr. Haroldine Laws in June and was felt to be stable on his medical regimen. The patient denies any recent problems with shortness of breath, chest pain, leg swelling, heart palpitations, orthopnea, or PND. He's been fairly limited from a physical perspective because of back problems and leg weakness. He ambulates with a walker. He's moving pretty slowly.  Past Medical History:  Diagnosis Date  . Aortic stenosis    a. mod-sev by echo 05/2016.  . Asthma   . Cerebrovascular disease 07/2010   TIA; carotid ultrasound in 07/2010-significant bilateral plaque without focal internal carotid artery stenosis; MRI -encephalomalacia left temporal and right temporal lobes; small inferior right cerebellar infarct; small vessel disease  . Chronic atrial fibrillation (HCC)    Paroxysmal; Echocardiogram in 2007-normal EF; mild LVH; left atrial enlargement; mild stenosis and minimal AI; negative stress nuclear study in 2008  . Chronic systolic CHF (congestive heart failure) ([place])    a. dx 05/2016 - EF 35-40%, diffuse HK, mod-severe AS, mod gradient, severe AVA VTI likely due to decreased cardiac output in setting of systolic dysfsunction and significant mitral regurgitation, mild MR, mod-severe MR, severe LAE, mild-mod RV dilation, mild RAE, mild-mod TR, mod PASP 39mmHg.  . CKD (chronic kidney disease), stage II   . Degenerative  joint disease    feet and legs  . Diabetes mellitus    no insulin; A1c of 6.6 in 2005  . Dizziness    occurs daily,especially in am  . Exertional dyspnea   . Gastroesophageal reflux disease   . Hepatic steatosis   . History of noncompliance with medical treatment   . Hyperlipidemia    adverse reactions to statins and niacin  . Hypertension    Borderline  . Irregular heartbeat   . Mitral regurgitation    a. mod-sev by echo 05/2016  . Peripheral vascular disease ([place])   . Renal insufficiency   . S/P TAVR (transcatheter aortic valve replacement) 07/19/2016   29 mm Edwards Sapien 3 transcatheter heart valve placed via left percutaneous transfemoral approach  . Temporal arteritis ([place])   . Tricuspid regurgitation    a. mild-mod TR by echo 05/2016    Past Surgical History:  Procedure Laterality Date  . COLONOSCOPY  2002  . COLONOSCOPY  01/19/2012   Procedure: COLONOSCOPY;  Surgeon: Rogene Houston, MD;  Location: [facility];  Service: Endoscopy;  Laterality: N/A;  1030  . LIPOMA EXCISION  1980  . ORIF ANKLE FRACTURE  2000   Right  . PERIPHERAL VASCULAR CATHETERIZATION N/A 07/11/2016   Procedure: Carotid PTA/Stent Intervention;  Surgeon: Lorretta Harp, MD;  Location: [facility];  Service: Cardiovascular;  Laterality: N/A;  . PROSTATE SURGERY  12/2011  . ROTATOR CUFF REPAIR     Right  . TEE WITHOUT CARDIOVERSION N/A 06/17/2016   Procedure: TRANSESOPHAGEAL ECHOCARDIOGRAM (  TEE);  Surgeon: Jerline Pain, MD;  Location: [facility] ENDOSCOPY;  Service: Cardiovascular;  Laterality: N/A;  . TEE WITHOUT CARDIOVERSION N/A 07/19/2016   Procedure: TRANSESOPHAGEAL ECHOCARDIOGRAM (TEE);  Surgeon: Sherren Mocha, MD;  Location: [facility];  Service: Open Heart Surgery;  Laterality: N/A;  . TRANSCATHETER AORTIC VALVE REPLACEMENT, TRANSFEMORAL N/A 07/19/2016   Procedure: TRANSCATHETER AORTIC VALVE REPLACEMENT, TRANSFEMORAL;  Surgeon: Sherren Mocha, MD;  Location: [facility];  Service: Open Heart Surgery;   Laterality: N/A;  . TRANSURETHRAL RESECTION OF PROSTATE  09/2011  . URETHRAL STRICTURE DILATATION  1980s    Current Outpatient Prescriptions  Medication Sig Dispense Refill  . bisoprolol (ZEBETA) 5 MG tablet TAKE (1/2) TABLET BY MOUTH ONCE DAILY. 14 tablet 5  . furosemide (LASIX) 20 MG tablet Take 1 tablet (20 mg total) by mouth 2 (two) times a week. Every Monday and Friday 10 tablet 6  . HYDROcodone-acetaminophen (NORCO/VICODIN) 5-325 MG tablet Take 1 tablet by mouth every 6 (six) hours as needed for moderate pain.    [name] losartan (COZAAR) 25 MG tablet TAKE 1/2 TABLET BY MOUTH DAILY 14 tablet 5  . nortriptyline (PAMELOR) 10 MG capsule Take 10-20 mg by mouth at bedtime. For burning feet    . rosuvastatin (CRESTOR) 5 MG tablet TAKE (1) TABLET BY MOUTH ONCE DAILY. 28 tablet 5  . sitaGLIPtin (JANUVIA) 50 MG tablet Take 1 tablet (50 mg total) by mouth daily. 30 tablet 1  . Sodium Phosphates (FLEET ENEMA RE) Place 1 Dose rectally daily as needed (constipation). Via rectum prn daily for constipation     . spironolactone (ALDACTONE) 25 MG tablet Take 0.5 tablets (12.5 mg total) by mouth daily. 30 tablet 5  . traZODone (DESYREL) 100 MG tablet TAKE 1 TABLET BY MOUTH AT BEDTIME. 28 tablet 10  . warfarin (COUMADIN) 6 MG tablet Take 6 mg by mouth as directed.     No current facility-administered medications for this visit.     Allergies:   Ambien [zolpidem tartrate]; Lipitor [atorvastatin calcium]; Ranitidine; Simvastatin; Xanax xr [alprazolam er]; Cholestatin; and Clopidogrel bisulfate   Social History:  The patient  reports that he quit smoking about 25 years ago. His smoking use included Cigarettes. He started smoking about 67 years ago. He has a 20.00 pack-year smoking history. His smokeless tobacco use includes Chew. He reports that he does not drink alcohol or use drugs.   Family History:  The patient's family history includes Diabetes in his father; Stroke in his mother.    ROS:  Please see the  history of present illness.  All other systems are reviewed and negative.    PHYSICAL EXAM: VS:  BP 114/60   Pulse (!) 52   Ht 6\' 1"  (1.854 m)   Wt 225 lb 12.8 oz (102.4 kg)   BMI 29.79 kg/m  , BMI Body mass index is 29.79 kg/m. GEN: Well nourished, well developed, pleasant elderly man in no acute distress  HEENT: normal  Neck: no JVD, no masses.  Cardiac: irregular without murmur or gallop                Respiratory:  clear to auscultation bilaterally, normal work of breathing GI: soft, nontender, nondistended, + BS MS: no deformity or atrophy  Ext: no pretibial edema Skin: warm and dry, no rash Neuro:  Strength and sensation are intact Psych: euthymic mood, full affect  EKG:  EKG is not ordered today.  Recent Labs: 07/22/2016: Magnesium 1.8 02/09/2017: TSH 15.316 02/21/2017: B Natriuretic Peptide 55.0 02/22/2017:  ALT 20 02/23/2017: BUN 18; Creatinine, Ser 1.10; Potassium 3.9; Sodium 138 03/28/2017: Hemoglobin 14.4; Platelets 202   Lipid Panel     Component Value Date/Time   CHOL 159 03/28/2017 0903   TRIG 160 (H) 03/28/2017 0903   HDL 40 03/28/2017 0903   CHOLHDL 4.0 03/28/2017 0903   CHOLHDL 5.9 08/01/2014 1003   VLDL 49 (H) 08/01/2014 1003   LDLCALC 87 03/28/2017 0903      Wt Readings from Last 3 Encounters:  06/19/17 225 lb 12.8 oz (102.4 kg)  04/25/17 219 lb 4 oz (99.5 kg)  03/27/17 216 lb (98 kg)     Cardiac Studies Reviewed: 2-D echo images are reviewed. Formal interpretation is currently pending. The patient's transcatheter aortic valve is functioning normally with peak and mean gradients of 9 and 6 mmHg, respectively. There is no paravalvular regurgitation present. The patient's ejection fraction which is previously range 35-40%, appears to be improved by my assessment.  ASSESSMENT AND PLAN: Aortic valve disorder status post transcatheter aortic valve replacement: Patient with New York Heart Association functional class II symptoms. He's followed closely in  the advanced heart failure clinic. No changes are made in his medications today. His echocardiogram is reviewed as above and is aortic bioprosthesis is functioning normally. SBE prophylaxis is discussed. [place] completed. The patient will return to valve clinic in one year.  Current medicines are reviewed with the patient today.  The patient does not have concerns regarding medicines.  Labs/ tests ordered today include:  No orders of the defined types were placed in this encounter.   Disposition:   FU one year in valve clinic  Signed, Sherren Mocha, MD  06/19/2017 4:51 PM    [facility] [address], [place], [place]  [address] Phone: [phone]; Fax: [phone]

## 2017-06-19 NOTE — Patient Instructions (Signed)

## 2017-06-21 ENCOUNTER — Encounter: Payer: Self-pay | Admitting: Family Medicine

## 2017-06-21 ENCOUNTER — Ambulatory Visit (INDEPENDENT_AMBULATORY_CARE_PROVIDER_SITE_OTHER): Payer: PPO | Admitting: Family Medicine

## 2017-06-21 VITALS — BP 128/82 | Ht 73.0 in | Wt 228.0 lb

## 2017-06-21 DIAGNOSIS — Z7901 Long term (current) use of anticoagulants: Secondary | ICD-10-CM | POA: Diagnosis not present

## 2017-06-21 DIAGNOSIS — I10 Essential (primary) hypertension: Secondary | ICD-10-CM

## 2017-06-21 DIAGNOSIS — I482 Chronic atrial fibrillation, unspecified: Secondary | ICD-10-CM

## 2017-06-21 DIAGNOSIS — M48062 Spinal stenosis, lumbar region with neurogenic claudication: Secondary | ICD-10-CM | POA: Diagnosis not present

## 2017-06-21 DIAGNOSIS — E1165 Type 2 diabetes mellitus with hyperglycemia: Secondary | ICD-10-CM

## 2017-06-21 LAB — POCT INR: INR: 3

## 2017-06-21 NOTE — Progress Notes (Signed)
   Subjective:    Patient ID: [name] [name], male    DOB: 11-22-1931, 83 y.o.   MRN: [id number]  Hypertension  This is a chronic problem. The current episode started more than 1 year ago. Pertinent negatives include no chest pain or headaches. Risk factors for coronary artery disease include diabetes mellitus, dyslipidemia, male gender and sedentary lifestyle. Treatments tried: losartin. There are no compliance problems.    Patient did have his INR today was 3.0 He denies any low sugar spells  he tries eat on a regular basis.  he no longer drives Uses trazodoneat night it helps fair Does tae his cholesterol medicine  patient also takes his heart blood pressure medicines  denies anybleeding issues. Denies any tachycardia   Review of Systems  Constitutional: Negative for activity change, fatigue and fever.  HENT: Negative for congestion.   Respiratory: Negative for cough and wheezing.   Cardiovascular: Negative for chest pain and leg swelling.  Musculoskeletal: Positive for arthralgias and back pain.  Neurological: Negative for headaches.       Objective:   Physical Exam  Constitutional: He appears well-nourished. No distress.  Cardiovascular: Normal rate and normal heart sounds.   No murmur heard. Pulmonary/Chest: Effort normal and breath sounds normal. No respiratory distress.  Musculoskeletal: He exhibits no edema.  Lymphadenopathy:    He has no cervical adenopathy.  Neurological: He is alert.  Psychiatric: His behavior is normal.  Vitals reviewed.   patient has atrial fibrillation  No sign of CHF exacerbation      Assessment & Plan:   CHF stable no sign of flare of Atrial fibrillation rate undercontrol  hyperlipidemia continue medication previous labs lok good  Subpar balance/mild ataxia-patient at risk of falling to use a cane or walker Patient no longer drives was advised not to drive Patient to follow-up in 3 months sooner problems No current lab work  necessary, check metabolic 7 in 6 weeks Continue INR at current Coumadin dosage recheck in 3-4 weeks

## 2017-06-26 ENCOUNTER — Other Ambulatory Visit: Payer: Self-pay | Admitting: Family Medicine

## 2017-06-27 NOTE — Telephone Encounter (Signed)
Last seen 06/21/17

## 2017-07-12 ENCOUNTER — Other Ambulatory Visit: Payer: Self-pay | Admitting: Family Medicine

## 2017-07-24 ENCOUNTER — Telehealth: Payer: Self-pay | Admitting: Family Medicine

## 2017-07-24 ENCOUNTER — Other Ambulatory Visit: Payer: Self-pay | Admitting: Family Medicine

## 2017-07-24 MED ORDER — TRAZODONE HCL 100 MG PO TABS: 50.0000 mg | ORAL_TABLET | Freq: Every day | ORAL | 0 refills | Status: DC

## 2017-07-24 NOTE — Telephone Encounter (Signed)
We should cut the dose in half. So therefore he will be on 50 mg each night. For now can use a half a tablet at night if that still does not significantly improve his alertness during the day then please call us back later this week and we can reduce it down to 25 mg-please change instructions on his medicine list to reflect this recommendation

## 2017-07-24 NOTE — Telephone Encounter (Signed)
[name] is aware we have made the change to the pharmacy.

## 2017-07-24 NOTE — Telephone Encounter (Signed)
Last seen 06/21/17

## 2017-07-24 NOTE — Telephone Encounter (Signed)
Patient is currently on Trazodone for sleep.  He is sleeping a lot more lately, at night and all during the day.  [name] wants to know if Dr. Nicki Reaper thinks he should cut back on this medication?

## 2017-07-25 NOTE — Telephone Encounter (Signed)
Please change this medication I recommend 50 mg - one half to one daily at bedtime when necessary insomnia

## 2017-07-26 ENCOUNTER — Ambulatory Visit (INDEPENDENT_AMBULATORY_CARE_PROVIDER_SITE_OTHER): Payer: PPO

## 2017-07-26 DIAGNOSIS — Z7901 Long term (current) use of anticoagulants: Secondary | ICD-10-CM

## 2017-07-26 LAB — POCT INR: INR: 2.5

## 2017-07-27 ENCOUNTER — Telehealth: Payer: Self-pay | Admitting: *Deleted

## 2017-07-27 ENCOUNTER — Other Ambulatory Visit: Payer: Self-pay | Admitting: *Deleted

## 2017-07-27 DIAGNOSIS — R358 Other polyuria: Secondary | ICD-10-CM

## 2017-07-27 DIAGNOSIS — R3589 Other polyuria: Secondary | ICD-10-CM

## 2017-07-27 LAB — POCT URINALYSIS DIPSTICK
GLUCOSE UA: 50
SPEC GRAV UA: 1.015 (ref 1.010–1.025)
UROBILINOGEN UA: 2 U/dL — AB
pH, UA: 5 (ref 5.0–8.0)

## 2017-07-27 NOTE — Telephone Encounter (Signed)
Pt's niece walked in office today with a urine sample in a pill bottle. She wants his urine checked for uti. States he is having confusion, wetting himself and diarrhea. Started last night. No fever when she checked, bp was good.  Call Nile Dear  Pt's niece. [phone] Results for orders placed or performed in visit on 07/27/17  POCT urinalysis dipstick  Result Value Ref Range   Color, UA     Clarity, UA     Glucose, UA 50    Bilirubin, UA     Ketones, UA     Spec Grav, UA 1.015 1.010 - 1.025   Blood, UA     pH, UA 5.0 5.0 - 8.0   Protein, UA     Urobilinogen, UA 2.0 (A) 0.2 or 1.0 E.U./dL   Nitrite, UA     Leukocytes, UA  Negative

## 2017-07-27 NOTE — Telephone Encounter (Signed)
Spoke with patient niece and informed her per Dr.Scott Luking- His urine does not show any signs of urinary infection under the microscope- if the patient is having confusion I would recommend strong consideration of going to the ER for further evaluation with a combination of diarrhea and confusion. Patient's niece verbalized understanding.

## 2017-07-27 NOTE — Telephone Encounter (Signed)
His urine does not show any signs of urinary infection under the microscope- if the patient is having confusion I would recommend strong consideration of going to the ER for further evaluation with a combination of diarrhea and confusion, [name] would need to make that decision

## 2017-08-02 ENCOUNTER — Telehealth: Payer: Self-pay | Admitting: Family Medicine

## 2017-08-02 ENCOUNTER — Other Ambulatory Visit: Payer: Self-pay

## 2017-08-02 MED ORDER — TRAZODONE HCL 100 MG PO TABS: 50.0000 mg | ORAL_TABLET | Freq: Every day | ORAL | 0 refills | Status: DC

## 2017-08-02 NOTE — Telephone Encounter (Signed)
Resent Trazodone to [facility]. Informed patient's niece.

## 2017-08-02 NOTE — Telephone Encounter (Signed)
A prescription was sent over for Trazodone on 07/24/17 to [place].  Patients niece says that she is unaware of why this was sent to [facility].  She said that [facility] uses [facility] and wants to make sure that this medication is for the right patient.

## 2017-08-23 ENCOUNTER — Ambulatory Visit: Payer: PPO

## 2017-08-24 ENCOUNTER — Ambulatory Visit (INDEPENDENT_AMBULATORY_CARE_PROVIDER_SITE_OTHER): Payer: PPO

## 2017-08-24 DIAGNOSIS — Z7901 Long term (current) use of anticoagulants: Secondary | ICD-10-CM | POA: Diagnosis not present

## 2017-08-24 DIAGNOSIS — Z23 Encounter for immunization: Secondary | ICD-10-CM | POA: Diagnosis not present

## 2017-08-24 LAB — POCT INR: INR: 2.5

## 2017-08-24 NOTE — Patient Instructions (Signed)
Take one tablet on sundays, mondays, wednesdays, fridays, and take one half tablet on tuesdays, thursdays, and saturdays. Recheck INR in 4 weeks

## 2017-08-30 ENCOUNTER — Other Ambulatory Visit: Payer: Self-pay | Admitting: Family Medicine

## 2017-08-30 NOTE — Telephone Encounter (Signed)
Please verify with caretakers/Barbara his niece if the patient is still on trazodone and Spiriva lactone if these are accurate he may have 6 months on each

## 2017-09-01 NOTE — Telephone Encounter (Signed)
Left message return call 09/01/17

## 2017-09-05 ENCOUNTER — Telehealth: Payer: Self-pay | Admitting: Family Medicine

## 2017-09-05 NOTE — Telephone Encounter (Signed)
Front--I am in the process of filling out the form for the patient regarding power energy. Please find out from the patient/nephew/niece regarding medical equipment she has that would be a health maintenance if his power went out? I assume it is the oxygen concentrator for [place]? If so I do believe that I have filled this out appropriately please send form to [employer]

## 2017-09-06 NOTE — Telephone Encounter (Signed)
Patients niece said that the only equipment is the oxygen concentrator.

## 2017-09-06 NOTE — Telephone Encounter (Signed)
Form was filled out and forwarded I'm sure it will be faxed in

## 2017-09-19 ENCOUNTER — Other Ambulatory Visit: Payer: Self-pay | Admitting: Family Medicine

## 2017-09-21 ENCOUNTER — Ambulatory Visit: Payer: PPO | Admitting: Family Medicine

## 2017-09-21 ENCOUNTER — Encounter: Payer: Self-pay | Admitting: Family Medicine

## 2017-09-21 VITALS — BP 122/82 | Ht 73.0 in | Wt 232.0 lb

## 2017-09-21 DIAGNOSIS — I10 Essential (primary) hypertension: Secondary | ICD-10-CM

## 2017-09-21 DIAGNOSIS — I482 Chronic atrial fibrillation, unspecified: Secondary | ICD-10-CM

## 2017-09-21 DIAGNOSIS — Z7901 Long term (current) use of anticoagulants: Secondary | ICD-10-CM

## 2017-09-21 DIAGNOSIS — E1142 Type 2 diabetes mellitus with diabetic polyneuropathy: Secondary | ICD-10-CM | POA: Diagnosis not present

## 2017-09-21 DIAGNOSIS — I25119 Atherosclerotic heart disease of native coronary artery with unspecified angina pectoris: Secondary | ICD-10-CM

## 2017-09-21 LAB — POCT INR: INR: 2

## 2017-09-21 MED ORDER — SITAGLIPTIN PHOSPHATE 100 MG PO TABS: 100.0000 mg | ORAL_TABLET | Freq: Every day | ORAL | 5 refills | Status: DC

## 2017-09-21 MED ORDER — MUPIROCIN 2 % EX OINT: 1.0000 "application " | TOPICAL_OINTMENT | Freq: Two times a day (BID) | CUTANEOUS | 5 refills | Status: DC | PRN

## 2017-09-21 NOTE — Progress Notes (Signed)
   Subjective:    Patient ID: [name] [name], male    DOB: 10-08-1932, 83 y.o.   MRN: [id number]  Hypertension  This is a chronic problem. The current episode started more than 1 year ago. Pertinent negatives include no chest pain. Risk factors for coronary artery disease include male gender, dyslipidemia and sedentary lifestyle. Treatments tried: aldactone, zebeta, cozaar, lasix. There are no compliance problems.     Patient denies any passing out he does get woozy when he stands up at times States he has been gaining weight because he has been running too much and not doing much in way of physical activity He has not had any falls and denies being depressed currently Tolerates his heart running fast on her but he does have chronic atrial fibrillation and is on Coumadin He has had diabetes does not check his sugars frequently but states morning sugars in the 1 50-200 range INR 2.0  Currently on coumadin 6 mg 1/2 tablet on tues thurs and sat and one tablet all other days.  Review of Systems  Constitutional: Negative for activity change, appetite change and fatigue.  HENT: Negative for congestion.   Respiratory: Negative for cough.   Cardiovascular: Negative for chest pain.  Gastrointestinal: Negative for abdominal pain.  Endocrine: Negative for polydipsia and polyphagia.  Neurological: Positive for dizziness. Negative for weakness.  Psychiatric/Behavioral: Negative for confusion.       Objective:   Physical Exam  Constitutional: He appears well-developed and well-nourished.  HENT:  Head: Normocephalic and atraumatic.  Right Ear: External ear normal.  Left Ear: External ear normal.  Nose: Nose normal.  Mouth/Throat: Oropharynx is clear and moist.  Eyes: EOM are normal. Pupils are equal, round, and reactive to light.  Neck: Normal range of motion. Neck supple. No thyromegaly present.  Cardiovascular: Normal rate and normal heart sounds.  No murmur heard. Pulmonary/Chest: Effort  normal and breath sounds normal. No respiratory distress. He has no wheezes.  Abdominal: Soft. Bowel sounds are normal. He exhibits no distension and no mass. There is no tenderness.  Genitourinary: Penis normal.  Musculoskeletal: Normal range of motion. He exhibits no edema.  Lymphadenopathy:    He has no cervical adenopathy.  Neurological: He is alert. He exhibits normal muscle tone.  Skin: Skin is warm and dry. No erythema.  Psychiatric: He has a normal mood and affect. His behavior is normal. Judgment normal.     25 minutes was spent with the patient. Greater than half the time was spent in discussion and answering questions and counseling regarding the issues that the patient came in for today.      Assessment & Plan:  Anticoagulant-Coumadin doing well keep as is follow-up in one month no sign of bleeding  History coronary artery disease very important keep blood pressure under good control E healthy keep weight under control and cholesterol under control  Atrial fib stable no rapid response continue Coumadin  Diabetes check A1c watch diet increase Januvia because asking glucoses are running in the 1 50-200 range  Hypertension-I am concerned about his blood pressure because it is on the low side therefore stop Aldactone. There is no easy answers because he is already on very low dose of his medications so this gives very little room for adjustment we will recheck him again in one month's time

## 2017-09-22 LAB — HEMOGLOBIN A1C
Est. average glucose Bld gHb Est-mCnc: 220 mg/dL
Hgb A1c MFr Bld: 9.3 % — ABNORMAL HIGH (ref 4.8–5.6)

## 2017-09-22 LAB — BASIC METABOLIC PANEL WITH GFR
BUN/Creatinine Ratio: 14 (ref 10–24)
BUN: 16 mg/dL (ref 8–27)
CO2: 23 mmol/L (ref 20–29)
Calcium: 9.3 mg/dL (ref 8.6–10.2)
Chloride: 102 mmol/L (ref 96–106)
Creatinine, Ser: 1.16 mg/dL (ref 0.76–1.27)
GFR calc Af Amer: 67 mL/min/1.73 (ref >59)
GFR calc non Af Amer: 58 mL/min/1.73 — ABNORMAL LOW (ref >59)
Glucose: 207 mg/dL — ABNORMAL HIGH (ref 65–99)
Potassium: 4.3 mmol/L (ref 3.5–5.2)
Sodium: 138 mmol/L (ref 134–144)

## 2017-09-22 LAB — HEPATIC FUNCTION PANEL
ALT: 19 IU/L (ref 0–44)
AST: 19 IU/L (ref 0–40)
Albumin: 4.1 g/dL (ref 3.5–4.7)
Alkaline Phosphatase: 75 IU/L (ref 39–117)
Bilirubin Total: 0.5 mg/dL (ref 0.0–1.2)
Bilirubin, Direct: 0.15 mg/dL (ref 0.00–0.40)
Total Protein: 6.8 g/dL (ref 6.0–8.5)

## 2017-09-26 ENCOUNTER — Other Ambulatory Visit: Payer: Self-pay | Admitting: Family Medicine

## 2017-10-03 ENCOUNTER — Telehealth: Payer: Self-pay | Admitting: Family Medicine

## 2017-10-03 DIAGNOSIS — E119 Type 2 diabetes mellitus without complications: Secondary | ICD-10-CM | POA: Diagnosis not present

## 2017-10-03 NOTE — Telephone Encounter (Signed)
Pt dropped off a handicap placard form to be filled out. Form is in nurse box.

## 2017-10-04 NOTE — Telephone Encounter (Signed)
This was filled out thank you 

## 2017-10-09 ENCOUNTER — Telehealth: Payer: Self-pay | Admitting: Family Medicine

## 2017-10-09 NOTE — Telephone Encounter (Signed)
Pt's niece dropped of the pt's glucose readings. Please see in red folder in office.

## 2017-10-10 NOTE — Telephone Encounter (Signed)
Nurses-please let family know that I reviewed over glucose readings.  I would like to give the medication a little more time before adding additional medicines.  I recommend his best as possible minimizing starches and sugars in the diet.  In addition to this I would recommend in regards to checking the glucose to randomly check the glucose.  The following is a suggestion-check sugars in the morning time 4 days out of the week and 3 days a week check it before supper and 2 days a week check it before bedtime-please send me some readings in a couple weeks time so I can review these again to see if we need to make any additional changes or add additional medications

## 2017-10-10 NOTE — Telephone Encounter (Signed)
Discussed with pt's niece [name]. She verbalized understanding.

## 2017-10-19 ENCOUNTER — Encounter: Payer: Self-pay | Admitting: Family Medicine

## 2017-10-19 ENCOUNTER — Ambulatory Visit: Payer: PPO | Admitting: Family Medicine

## 2017-10-19 VITALS — BP 134/92 | Ht 73.0 in | Wt 242.0 lb

## 2017-10-19 DIAGNOSIS — I6521 Occlusion and stenosis of right carotid artery: Secondary | ICD-10-CM | POA: Diagnosis not present

## 2017-10-19 DIAGNOSIS — G459 Transient cerebral ischemic attack, unspecified: Secondary | ICD-10-CM | POA: Diagnosis not present

## 2017-10-19 DIAGNOSIS — I5043 Acute on chronic combined systolic (congestive) and diastolic (congestive) heart failure: Secondary | ICD-10-CM

## 2017-10-19 DIAGNOSIS — Z7901 Long term (current) use of anticoagulants: Secondary | ICD-10-CM | POA: Diagnosis not present

## 2017-10-19 LAB — POCT INR: INR: 2.8

## 2017-10-19 NOTE — Progress Notes (Signed)
   Subjective:    Patient ID: [name] [name], male    DOB: [birth year], 83 y.o.   MRN: [id number]  HPI  Patient is here today for a follow up on HTN and PT/INR. He takes losartan 25 mg 1/2 tablet daily. He eats healthy and does not get any exercise.  He is here today for follow-up regarding his atrial fibrillation Long-term anticoagulation also here for follow-up regarding CHF heart disease plus also he is having intermittent numbness on the left side of his tongue as well as his left hand and some sluggishness he denies high fever chills sweats denies chest tightness pressure pain he does relate significant fatigue tiredness Review of Systems See above    Results for orders placed or performed in visit on 10/19/17  POCT INR  Result Value Ref Range   INR 2.8     Objective:   Physical Exam  Heart the rate is controlled blood pressure is acceptable I looked over glucose readings and blood pressure readings his blood pressure readings at home are very high I cannot help but think the machine is wrong at home because her blood pressure readings here are always on the lower end extremities no edema skin warm dry no neurologic deficits noted currently 25 minutes was spent with the patient. Greater than half the time was spent in discussion and answering questions and counseling regarding the issues that the patient came in for today.     Assessment & Plan:  Anticoagulation keep as is recheck it again in 1 month CHF stable continue current measures  Diabetes good control continue current measures  Blood pressure good control no sign of it being high  Neurologic issues possible TIA already on anticoagulant not sure if the patient needs to have carotid scans he has had stents placed we will recheck out to his cardiologist to see if they have input regarding this  Office visit in 1 month

## 2017-10-23 ENCOUNTER — Other Ambulatory Visit: Payer: Self-pay

## 2017-10-23 ENCOUNTER — Emergency Department (HOSPITAL_COMMUNITY): Payer: PPO

## 2017-10-23 ENCOUNTER — Emergency Department (HOSPITAL_COMMUNITY)
Admission: EM | Admit: 2017-10-23 | Discharge: 2017-10-23 | Disposition: A | Discharge status: Home / Self Care | Payer: PPO | Attending: Emergency Medicine | Admitting: Emergency Medicine

## 2017-10-23 ENCOUNTER — Encounter (HOSPITAL_COMMUNITY): Payer: Self-pay | Admitting: Emergency Medicine

## 2017-10-23 DIAGNOSIS — R069 Unspecified abnormalities of breathing: Secondary | ICD-10-CM | POA: Diagnosis not present

## 2017-10-23 DIAGNOSIS — M79605 Pain in left leg: Secondary | ICD-10-CM | POA: Insufficient documentation

## 2017-10-23 DIAGNOSIS — N182 Chronic kidney disease, stage 2 (mild): Secondary | ICD-10-CM | POA: Diagnosis not present

## 2017-10-23 DIAGNOSIS — I5022 Chronic systolic (congestive) heart failure: Secondary | ICD-10-CM | POA: Diagnosis not present

## 2017-10-23 DIAGNOSIS — M79673 Pain in unspecified foot: Secondary | ICD-10-CM | POA: Diagnosis not present

## 2017-10-23 DIAGNOSIS — R229 Localized swelling, mass and lump, unspecified: Secondary | ICD-10-CM | POA: Diagnosis not present

## 2017-10-23 DIAGNOSIS — E1122 Type 2 diabetes mellitus with diabetic chronic kidney disease: Secondary | ICD-10-CM | POA: Diagnosis not present

## 2017-10-23 DIAGNOSIS — R0602 Shortness of breath: Secondary | ICD-10-CM | POA: Diagnosis not present

## 2017-10-23 DIAGNOSIS — I2510 Atherosclerotic heart disease of native coronary artery without angina pectoris: Secondary | ICD-10-CM | POA: Diagnosis not present

## 2017-10-23 DIAGNOSIS — Z87891 Personal history of nicotine dependence: Secondary | ICD-10-CM | POA: Diagnosis not present

## 2017-10-23 DIAGNOSIS — M79604 Pain in right leg: Secondary | ICD-10-CM | POA: Diagnosis present

## 2017-10-23 DIAGNOSIS — R609 Edema, unspecified: Secondary | ICD-10-CM | POA: Diagnosis not present

## 2017-10-23 DIAGNOSIS — Z7984 Long term (current) use of oral hypoglycemic drugs: Secondary | ICD-10-CM | POA: Insufficient documentation

## 2017-10-23 DIAGNOSIS — R600 Localized edema: Secondary | ICD-10-CM | POA: Diagnosis not present

## 2017-10-23 DIAGNOSIS — Z7901 Long term (current) use of anticoagulants: Secondary | ICD-10-CM | POA: Insufficient documentation

## 2017-10-23 DIAGNOSIS — I130 Hypertensive heart and chronic kidney disease with heart failure and stage 1 through stage 4 chronic kidney disease, or unspecified chronic kidney disease: Secondary | ICD-10-CM | POA: Diagnosis not present

## 2017-10-23 LAB — CBC WITH DIFFERENTIAL/PLATELET
Basophils Absolute: 0.1 10*3/uL (ref 0.0–0.1)
Basophils Relative: 1 %
Eosinophils Absolute: 0.7 10*3/uL (ref 0.0–0.7)
Eosinophils Relative: 8 %
HEMATOCRIT: 45.9 % (ref 39.0–52.0)
HEMOGLOBIN: 15 g/dL (ref 13.0–17.0)
LYMPHS PCT: 10 %
Lymphs Abs: 0.9 10*3/uL (ref 0.7–4.0)
MCH: 30.5 pg (ref 26.0–34.0)
MCHC: 32.7 g/dL (ref 30.0–36.0)
MCV: 93.5 fL (ref 78.0–100.0)
Monocytes Absolute: 0.6 10*3/uL (ref 0.1–1.0)
Monocytes Relative: 6 %
NEUTROS ABS: 6.6 10*3/uL (ref 1.7–7.7)
NEUTROS PCT: 75 %
PLATELETS: 231 10*3/uL (ref 150–400)
RBC: 4.91 MIL/uL (ref 4.22–5.81)
RDW: 14.1 % (ref 11.5–15.5)
WBC: 8.8 10*3/uL (ref 4.0–10.5)

## 2017-10-23 LAB — COMPREHENSIVE METABOLIC PANEL
ALT: 19 U/L (ref 17–63)
AST: 22 U/L (ref 15–41)
Albumin: 4.4 g/dL (ref 3.5–5.0)
Alkaline Phosphatase: 79 U/L (ref 38–126)
Anion gap: 11 (ref 5–15)
BILIRUBIN TOTAL: 1.6 mg/dL — AB (ref 0.3–1.2)
BUN: 14 mg/dL (ref 6–20)
CHLORIDE: 97 mmol/L — AB (ref 101–111)
CO2: 29 mmol/L (ref 22–32)
CREATININE: 1.1 mg/dL (ref 0.61–1.24)
Calcium: 9.3 mg/dL (ref 8.9–10.3)
GFR calc Af Amer: >60 mL/min (ref >60)
Glucose, Bld: 229 mg/dL — ABNORMAL HIGH (ref 65–99)
Potassium: 3.2 mmol/L — ABNORMAL LOW (ref 3.5–5.1)
Sodium: 137 mmol/L (ref 135–145)
Total Protein: 8.4 g/dL — ABNORMAL HIGH (ref 6.5–8.1)

## 2017-10-23 LAB — BRAIN NATRIURETIC PEPTIDE: B NATRIURETIC PEPTIDE 5: 157 pg/mL — AB (ref 0.0–100.0)

## 2017-10-23 MED ORDER — FUROSEMIDE 40 MG PO TABS
20.0000 mg | ORAL_TABLET | Freq: Once | ORAL | Status: AC
  Administered 2017-10-23: 20 mg via ORAL
  Filled 2017-10-23: qty 1

## 2017-10-23 MED ORDER — FUROSEMIDE 20 MG PO TABS: 20.0000 mg | ORAL_TABLET | Freq: Every day | ORAL | 0 refills | Status: DC

## 2017-10-23 NOTE — ED Provider Notes (Signed)
[facility] EMERGENCY DEPARTMENT Provider Note   CSN: [id number] Arrival date & time: 10/23/17  1018     History   Chief Complaint Chief Complaint  Patient presents with  . Leg Pain    HPI [name] [name] is a 83 y.o. male.  Patient complains of swelling to both of his legs.  He is taking Lasix twice a week for fluid.   The history is provided by the patient. No language interpreter was used.  Leg Pain   This is a recurrent problem. The current episode started more than 2 days ago. The problem occurs constantly. The problem has not changed since onset.Pain location: Both lower legs. The quality of the pain is described as aching. The pain is at a severity of 3/10. The pain is moderate. Pertinent negatives include full range of motion.    Past Medical History:  Diagnosis Date  . Aortic stenosis    a. mod-sev by echo 05/2016.  . Asthma   . Cerebrovascular disease 07/2010   TIA; carotid ultrasound in 07/2010-significant bilateral plaque without focal internal carotid artery stenosis; MRI -encephalomalacia left temporal and right temporal lobes; small inferior right cerebellar infarct; small vessel disease  . Chronic atrial fibrillation (HCC)    Paroxysmal; Echocardiogram in 2007-normal EF; mild LVH; left atrial enlargement; mild stenosis and minimal AI; negative stress nuclear study in 2008  . Chronic systolic CHF (congestive heart failure) ([place])    a. dx 05/2016 - EF 35-40%, diffuse HK, mod-severe AS, mod gradient, severe AVA VTI likely due to decreased cardiac output in setting of systolic dysfsunction and significant mitral regurgitation, mild MR, mod-severe MR, severe LAE, mild-mod RV dilation, mild RAE, mild-mod TR, mod PASP 58mmHg.  . CKD (chronic kidney disease), stage II   . Degenerative joint disease    feet and legs  . Diabetes mellitus    no insulin; A1c of 6.6 in 2005  . Dizziness    occurs daily,especially in am  . Exertional dyspnea   . Gastroesophageal reflux  disease   . Hepatic steatosis   . History of noncompliance with medical treatment   . Hyperlipidemia    adverse reactions to statins and niacin  . Hypertension    Borderline  . Irregular heartbeat   . Mitral regurgitation    a. mod-sev by echo 05/2016  . Peripheral vascular disease ([place])   . Renal insufficiency   . S/P TAVR (transcatheter aortic valve replacement) 07/19/2016   29 mm Edwards Sapien 3 transcatheter heart valve placed via left percutaneous transfemoral approach  . Temporal arteritis ([place])   . Tricuspid regurgitation    a. mild-mod TR by echo 05/2016    Patient Active Problem List   Diagnosis Date Noted  . Altered mental status 02/21/2017  . Congestive heart failure (CHF) ([place]) 02/09/2017  . Acute on chronic systolic CHF (congestive heart failure) ([place]) 02/09/2017  . Controlled type 2 diabetes mellitus with hyperglycemia ([place]) 02/09/2017  . Elevated troponin   . S/P TAVR (transcatheter aortic valve replacement) 07/19/2016  . Mitral regurgitation   . VT (ventricular tachycardia) ([place])   . Cardiogenic shock ([place])   . Acute respiratory failure with hypoxia ([place])   . Dyspnea 06/28/2016  . CAD (coronary artery disease), native coronary artery 06/28/2016  . Acute on chronic combined systolic and diastolic CHF, NYHA class 4 ([place])   . Anemia of chronic disease 04/12/2016  . Orthostatic hypotension 08/20/2015  . AKI (acute kidney injury) ([place]) 08/20/2015  . Asthma exacerbation 08/20/2015  .  Cognitive dysfunction 05/29/2015  . Chronic back pain 07/29/2014  . Lumbar pain 06/23/2014  . Chronic pain of right ankle 05/30/2014  . Diabetic neuropathy ([place]) 05/30/2014  . Chest congestion 05/02/2013  . Wheezing 05/02/2013  . Unspecified constipation 04/30/2013  . Leg edema, left 04/27/2013  . Carotid stenosis 04/25/2012  . BPH (benign prostatic hypertrophy) with urinary obstruction 09/27/2011  . Dyspnea on exertion 08/01/2011  . Diabetes mellitus ([place]) 07/14/2011  .  Hypertension   . Atrial fibrillation ([place])   . Hyperlipidemia   . History of noncompliance with medical treatment   . Aortic stenosis   . Cerebrovascular disease 07/15/2010    Past Surgical History:  Procedure Laterality Date  . COLONOSCOPY  2002  . COLONOSCOPY  01/19/2012   Procedure: COLONOSCOPY;  Surgeon: Rogene Houston, MD;  Location: [facility];  Service: Endoscopy;  Laterality: N/A;  1030  . LIPOMA EXCISION  1980  . ORIF ANKLE FRACTURE  2000   Right  . PERIPHERAL VASCULAR CATHETERIZATION N/A 07/11/2016   Procedure: Carotid PTA/Stent Intervention;  Surgeon: Lorretta Harp, MD;  Location: [facility] CV LAB;  Service: Cardiovascular;  Laterality: N/A;  . PROSTATE SURGERY  12/2011  . ROTATOR CUFF REPAIR     Right  . TEE WITHOUT CARDIOVERSION N/A 06/17/2016   Procedure: TRANSESOPHAGEAL ECHOCARDIOGRAM (TEE);  Surgeon: Jerline Pain, MD;  Location: [facility];  Service: Cardiovascular;  Laterality: N/A;  . TEE WITHOUT CARDIOVERSION N/A 07/19/2016   Procedure: TRANSESOPHAGEAL ECHOCARDIOGRAM (TEE);  Surgeon: Sherren Mocha, MD;  Location: [facility];  Service: Open Heart Surgery;  Laterality: N/A;  . TRANSCATHETER AORTIC VALVE REPLACEMENT, TRANSFEMORAL N/A 07/19/2016   Procedure: TRANSCATHETER AORTIC VALVE REPLACEMENT, TRANSFEMORAL;  Surgeon: Sherren Mocha, MD;  Location: [place] [facility];  Service: Open Heart Surgery;  Laterality: N/A;  . TRANSURETHRAL RESECTION OF PROSTATE  09/2011  . URETHRAL STRICTURE DILATATION  1980s       Home Medications    Prior to Admission medications   Medication Sig Start Date End Date Taking? Authorizing Provider  bisoprolol (ZEBETA) 5 MG tablet TAKE (1/2) TABLET BY MOUTH ONCE DAILY. 09/26/17   Kathyrn Drown, MD  furosemide (LASIX) 20 MG tablet Take 1 tablet (20 mg total) by mouth 2 (two) times a week. Every Monday and Friday 04/27/17   Bensimhon, Shaune Pascal, MD  furosemide (LASIX) 20 MG tablet Take 1 tablet (20 mg total) by mouth daily for 10 days. 10/23/17  11/02/17  Milton Ferguson, MD  HYDROcodone-acetaminophen (NORCO/VICODIN) 5-325 MG tablet Take 1 tablet by mouth every 6 (six) hours as needed for moderate pain.    [provider]  losartan (COZAAR) 25 MG tablet TAKE 1/2 TABLET BY MOUTH DAILY 09/20/17   Kathyrn Drown, MD  mupirocin ointment (BACTROBAN) 2 % Apply 1 application 2 (two) times daily as needed topically. 09/21/17   Kathyrn Drown, MD  nortriptyline (PAMELOR) 10 MG capsule TAKE 1 OR 2 CAPSULES AT BEDTIME FOR BURNING IN FEET.[28 IN PACKS/28 IN BOTTLE] 06/27/17   Kathyrn Drown, MD  rosuvastatin (CRESTOR) 5 MG tablet TAKE (1) TABLET BY MOUTH ONCE DAILY. 09/26/17   Kathyrn Drown, MD  sitaGLIPtin (JANUVIA) 100 MG tablet Take 1 tablet (100 mg total) daily by mouth. 09/21/17   Kathyrn Drown, MD  Sodium Phosphates (FLEET ENEMA RE) Place 1 Dose rectally daily as needed (constipation). Via rectum prn daily for constipation     [provider]  traZODone (DESYREL) 50 MG tablet TAKE 1 TABLET BY MOUTH  AT BEDTIME. 09/01/17   Kathyrn Drown, MD  warfarin (COUMADIN) 6 MG tablet Take 6 mg by mouth as directed.    [provider]    Family History Family History  Problem Relation Age of Onset  . Stroke Mother   . Diabetes Father   . Colon cancer Neg Hx     Social History Social History   Tobacco Use  . Smoking status: Former Smoker    Packs/day: 1.00    Years: 20.00    Pack years: 20.00    Types: Cigarettes    Start date: 07/04/1950    Last attempt to quit: 04/25/1992    Years since quitting: 25.5  . Smokeless tobacco: Current User    Types: Chew  Substance Use Topics  . Alcohol use: No    Alcohol/week: 0.0 oz  . Drug use: No     Allergies   Ambien [zolpidem tartrate]; Lipitor [atorvastatin calcium]; Ranitidine; Simvastatin; Xanax xr [alprazolam er]; Cholestatin; and Clopidogrel bisulfate   Review of Systems Review of Systems  Constitutional: Negative for appetite change and fatigue.  HENT:  Negative for congestion, ear discharge and sinus pressure.   Eyes: Negative for discharge.  Respiratory: Negative for cough.   Cardiovascular: Negative for chest pain.  Gastrointestinal: Negative for abdominal pain and diarrhea.  Genitourinary: Negative for frequency and hematuria.  Musculoskeletal: Negative for back pain.       Edema  Skin: Negative for rash.  Neurological: Negative for seizures and headaches.  Psychiatric/Behavioral: Negative for hallucinations.     Physical Exam Updated Vital Signs BP (!) 163/99   Pulse 81   Temp 97.7 F (36.5 C) (Oral)   Resp (!) 23   SpO2 98%   Physical Exam  Constitutional: He is oriented to person, place, and time. He appears well-developed.  HENT:  Head: Normocephalic.  Eyes: Conjunctivae and EOM are normal. No scleral icterus.  Neck: Neck supple. No thyromegaly present.  Cardiovascular: Normal rate and regular rhythm. Exam reveals no gallop and no friction rub.  No murmur heard. Pulmonary/Chest: No stridor. He has no wheezes. He has no rales. He exhibits no tenderness.  Abdominal: He exhibits no distension. There is no tenderness. There is no rebound.  Musculoskeletal: Normal range of motion. He exhibits edema.  Lymphadenopathy:    He has no cervical adenopathy.  Neurological: He is oriented to person, place, and time. He exhibits normal muscle tone. Coordination normal.  Skin: No rash noted. No erythema.  Psychiatric: He has a normal mood and affect. His behavior is normal.     ED Treatments / Results  Labs (all labs ordered are listed, but only abnormal results are displayed) Labs Reviewed  COMPREHENSIVE METABOLIC PANEL - Abnormal; Notable for the following components:      Result Value   Potassium 3.2 (*)    Chloride 97 (*)    Glucose, Bld 229 (*)    Total Protein 8.4 (*)    Total Bilirubin 1.6 (*)    All other components within normal limits  BRAIN NATRIURETIC PEPTIDE - Abnormal; Notable for the following components:    B Natriuretic Peptide 157.0 (*)    All other components within normal limits  CBC WITH DIFFERENTIAL/PLATELET    EKG  EKG Interpretation None       Radiology Dg Chest 2 View  Result Date: 10/23/2017 CLINICAL DATA:  Shortness of breath, lower extremity swelling EXAM: CHEST  2 VIEW COMPARISON:  CTA chest dated 02/21/2017 FINDINGS: Small right pleural effusion. Suspected  trace left pleural effusion. Mild bibasilar atelectasis. Pulmonary vascular congestion with suspected mild interstitial edema. No pneumothorax. Cardiomegaly. IMPRESSION: Cardiomegaly with suspected mild interstitial edema and small/trace bilateral pleural effusions. Electronically Signed   By: Julian Hy M.D.   On: 10/23/2017 10:55    Procedures Procedures (including critical care time)  Medications Ordered in ED Medications  furosemide (LASIX) tablet 20 mg (not administered)     Initial Impression / Assessment and Plan / ED Course  I have reviewed the triage vital signs and the nursing notes.  Pertinent labs & imaging results that were available during my care of the patient were reviewed by me and considered in my medical decision making (see chart for details).     Patient with mild increased edema in legs.  We will increase his Lasix to 20 mg a day and have him follow-up with PCP  Final Clinical Impressions(s) / ED Diagnoses   Final diagnoses:  Edema, lower extremity    ED Discharge Orders        Ordered    furosemide (LASIX) 20 MG tablet  Daily     10/23/17 1256       Milton Ferguson, MD 10/23/17 1259

## 2017-10-23 NOTE — Discharge Instructions (Signed)
Start taking your fluid pill the Lasix 1 pill a day and follow-up with your doctor the end of this week

## 2017-10-24 NOTE — Progress Notes (Signed)
Nurses-please set up patient for carotid Doppler studies and also for echo plus a follow-up office visit with Dr.Koneswaren his cardiologist.  Diagnosis TIA.  Please wait till the end of the week before scheduling these tests you may talk with the patient or his niece [name] at the end of the week. (I would not recommend calling in the next few days because his wife just recently died)

## 2017-10-25 ENCOUNTER — Other Ambulatory Visit: Payer: Self-pay

## 2017-10-25 ENCOUNTER — Telehealth: Payer: Self-pay | Admitting: Family Medicine

## 2017-10-25 DIAGNOSIS — G459 Transient cerebral ischemic attack, unspecified: Secondary | ICD-10-CM

## 2017-10-25 DIAGNOSIS — I4891 Unspecified atrial fibrillation: Secondary | ICD-10-CM

## 2017-10-25 DIAGNOSIS — R0602 Shortness of breath: Secondary | ICD-10-CM

## 2017-10-25 NOTE — Progress Notes (Signed)
Can I see this pt Friday afternoon after 1pm   J BrancH MD

## 2017-10-25 NOTE — Patient Outreach (Signed)
[facility]) Care Management  10/25/2017  Rich Paprocki Kanner 29-Sep-1932 [id number]   Telephone Screen  Referral Date: 10/24/17 Referral Source: ED Census Report Referral Reason: " 1 or more ED visits in the past 6 months" Insurance: HTA   Outreach attempt # 1 to patient. Spoke with nephew(Steve). He voices that patient is at home and doing fairly well. He voices that he still has some swelling in his legs but it has improved. Patient is taking increased diuretic as ordered. He has scale in the home and monitoring weight. He reports patient has a CNA in the home to assist with weighing, taking meds and personal care. Nephew voices that patient's spouse passed away the same day he went to the ED. He states that while patient was in the ED his wife was upstairs on medical floor and passed away. Condolences offered. He voices patient is coping fairly well. He takes patient to appts and will make PCP f/u appt after the funeral. He denies any RN CM needs or concerns at this time and was appreciative of this call.       Plan: RN CM will notify [facility] administrative assistant of case status.   Enzo Montgomery, RN,BSN,CCM [place] Management Telephonic Care Management Coordinator Direct Phone: [phone] Toll Free: [phone] Fax: [phone]

## 2017-10-25 NOTE — Telephone Encounter (Signed)
I sent this as result note-I do not feel it is been processed yet.  I am recommending carotid Doppler studies and echo because of the TIA along with atrial fib and shortness of breath.  He will need a follow-up visit with his cardiologist in referral for him to have follow-up with cardiology.  Thank you

## 2017-10-26 ENCOUNTER — Telehealth: Payer: Self-pay | Admitting: Family Medicine

## 2017-10-26 NOTE — Telephone Encounter (Signed)
Wife passed away this week

## 2017-10-26 NOTE — Telephone Encounter (Signed)
zofran 4 odt 16 one q six hrs prn

## 2017-10-26 NOTE — Telephone Encounter (Signed)
Pt is having nausea and states that he is feeling frail. Niece doesn't know if it is his nerves, but would like something called in for the nausea if possible. Please advise.   [facility]

## 2017-10-27 MED ORDER — ONDANSETRON 4 MG PO TBDP: ORAL_TABLET | ORAL | 0 refills | Status: DC

## 2017-10-27 NOTE — Addendum Note (Signed)
Addended by: Karle Barr on: 10/27/2017 02:38 PM   Modules accepted: Orders

## 2017-10-27 NOTE — Telephone Encounter (Signed)
Sent in rx to [facility]. Nephew Hollace Hayward is aware. (POA)

## 2017-10-27 NOTE — Telephone Encounter (Signed)
2 D Echo scheduled 11/01/2017 at 11:15 am [facility]. Carotid Doppler scheduled 11/01/2017 at 1:30 and referral to Cardiologist sent . Nephew Hollace Hayward is aware of all. Also gave sympathies for the passing of Mrs. Langenfeld on our behalf.

## 2017-10-30 NOTE — Progress Notes (Signed)
Echo and carotid scheduled for 11/01/17

## 2017-11-01 ENCOUNTER — Ambulatory Visit (HOSPITAL_COMMUNITY)
Admission: RE | Admit: 2017-11-01 | Discharge: 2017-11-01 | Disposition: A | Discharge status: Home / Self Care | Payer: PPO | Source: Ambulatory Visit | Attending: Family Medicine | Admitting: Family Medicine

## 2017-11-01 DIAGNOSIS — I6522 Occlusion and stenosis of left carotid artery: Secondary | ICD-10-CM | POA: Insufficient documentation

## 2017-11-01 DIAGNOSIS — I4891 Unspecified atrial fibrillation: Secondary | ICD-10-CM | POA: Insufficient documentation

## 2017-11-01 DIAGNOSIS — I420 Dilated cardiomyopathy: Secondary | ICD-10-CM | POA: Diagnosis not present

## 2017-11-01 DIAGNOSIS — G459 Transient cerebral ischemic attack, unspecified: Secondary | ICD-10-CM

## 2017-11-01 DIAGNOSIS — Z952 Presence of prosthetic heart valve: Secondary | ICD-10-CM | POA: Insufficient documentation

## 2017-11-01 DIAGNOSIS — R0602 Shortness of breath: Secondary | ICD-10-CM | POA: Diagnosis not present

## 2017-11-01 DIAGNOSIS — I081 Rheumatic disorders of both mitral and tricuspid valves: Secondary | ICD-10-CM | POA: Diagnosis not present

## 2017-11-01 LAB — ECHOCARDIOGRAM COMPLETE
AO mean calculated velocity dopler: 95.9 cm/s
AOPV: 0.35 m/s
AOVTI: 28.3 cm
AV Area VTI index: 0.52 cm2/m2
AV Area mean vel: 1.28 cm2
AV Peak grad: 9 mmHg
AV VEL mean LVOT/AV: 0.37
AV pk vel: 152 cm/s
AV vel: 1.26
AVA: 1.26 cm2
AVAREAMEANVIN: 0.53 cm2/m2
AVAREAVTI: 1.22 cm2
AVG: 5 mmHg
CHL CUP AV PEAK INDEX: 0.51
CHL CUP DOP CALC LVOT VTI: 10.3 cm
CHL CUP MV DEC (S): 155
CHL CUP RV SYS PRESS: 44 mmHg
CHL CUP TV REG PEAK VELOCITY: 319 cm/s
E decel time: 155 msec
FS: 16 % — AB (ref 28–44)
IV/PV OW: 0.92
LADIAMINDEX: 2.12 cm/m2
LASIZE: 51 mm
LAVOL: 127 mL
LAVOLA4C: 135 mL
LAVOLIN: 52.8 mL/m2
LDCA: 3.46 cm2
LEFT ATRIUM END SYS DIAM: 51 mm
LV dias vol index: 45 mL/m2
LV sys vol index: 35 mL/m2
LV sys vol: 84 mL — AB
LVDIAVOL: 108 mL (ref 62–150)
LVOT diameter: 21 mm
LVOT peak VTI: 0.36 cm
LVOT peak grad rest: 1 mmHg
LVOT peak vel: 53.8 cm/s
LVOTSV: 36 mL
MRPISAEROA: 0.07 cm2
MV VTI: 148 cm
MV pk E vel: 149 m/s
MVPG: 9 mmHg
PW: 12.2 mm — AB (ref 0.6–1.1)
RV LATERAL S' VELOCITY: 8.81 cm/s
Simpson's disk: 22
Stroke v: 24 ml
TAPSE: 14.7 mm
TR max vel: 319 cm/s
Valve area index: 0.52

## 2017-11-01 NOTE — Progress Notes (Signed)
*  PRELIMINARY RESULTS* Echocardiogram 2D Echocardiogram has been performed.  [name] [name] 11/01/2017, 12:46 PM

## 2017-11-03 ENCOUNTER — Ambulatory Visit: Payer: PPO | Admitting: Family Medicine

## 2017-11-03 VITALS — BP 120/72 | Temp 97.5°F | Ht 73.0 in

## 2017-11-03 DIAGNOSIS — R5383 Other fatigue: Secondary | ICD-10-CM

## 2017-11-03 DIAGNOSIS — R059 Cough, unspecified: Secondary | ICD-10-CM

## 2017-11-03 DIAGNOSIS — R05 Cough: Secondary | ICD-10-CM

## 2017-11-03 DIAGNOSIS — Z79899 Other long term (current) drug therapy: Secondary | ICD-10-CM | POA: Diagnosis not present

## 2017-11-03 DIAGNOSIS — R0602 Shortness of breath: Secondary | ICD-10-CM

## 2017-11-03 DIAGNOSIS — M79673 Pain in unspecified foot: Secondary | ICD-10-CM

## 2017-11-03 DIAGNOSIS — J40 Bronchitis, not specified as acute or chronic: Secondary | ICD-10-CM | POA: Diagnosis not present

## 2017-11-03 MED ORDER — AMOXICILLIN 500 MG PO CAPS: 500.0000 mg | ORAL_CAPSULE | Freq: Three times a day (TID) | ORAL | 0 refills | Status: DC

## 2017-11-03 MED ORDER — FUROSEMIDE 20 MG PO TABS: 20.0000 mg | ORAL_TABLET | Freq: Every day | ORAL | 0 refills | Status: DC

## 2017-11-03 MED ORDER — HYDROCODONE-HOMATROPINE 5-1.5 MG/5ML PO SYRP: ORAL_SOLUTION | ORAL | 0 refills | Status: DC

## 2017-11-03 MED ORDER — ALBUTEROL SULFATE (2.5 MG/3ML) 0.083% IN NEBU: 2.5000 mg | INHALATION_SOLUTION | Freq: Four times a day (QID) | RESPIRATORY_TRACT | 3 refills | Status: DC | PRN

## 2017-11-03 MED ORDER — CEFTRIAXONE SODIUM 1 G IJ SOLR
500.0000 mg | Freq: Once | INTRAMUSCULAR | Status: AC
  Administered 2017-11-03: 500 mg via INTRAMUSCULAR

## 2017-11-03 MED ORDER — POTASSIUM CHLORIDE ER 20 MEQ PO TBCR: EXTENDED_RELEASE_TABLET | ORAL | 0 refills | Status: DC

## 2017-11-03 NOTE — Progress Notes (Signed)
   Subjective:    Patient ID: [name] [name], male    DOB: 1932-02-04, 83 y.o.   MRN: [id number]  Cough  This is a new problem. Associated symptoms include wheezing. Associated symptoms comments: congestion.   shortness of breath. O2 checked in office 89.  Requesting rx for albuterol to do breathing treatments. And would like some cough syrup.   Swelling in feet. Went to ED on 12/10 and increased lasix.   Would like bloodwork checked for vit d, iron, potassium, and checked for gout.   Patient and family resistant to going back to emergency room.  Progressive shortness of breath over the past few days.  Cough productive at times.  No frank fever.  Wheezing is definitely worsened.  Family using nebulizer that is at home.  Also seen in the emergency room recently.  That report reviewed exacerbation of CHF.  Patient had Lasix increased per ER doctor's note to 20 mg daily apparently family increased to 20 mg every other day.  Also blood work at that time potassium borderline low alert Review of Systems  Respiratory: Positive for cough and wheezing.        Objective:   Physical Exam  Somewhat disheveled, alert.  Vitals stable.  Oxygen saturation 89% no baseline tachypnea positive expiratory wheezes however.  Positive inspiratory crackles.  Abdomen benign ankles trace edema to 1+   impression 1 exacerbation of CHF discussed  2.  Exacerbation of reactive airways discussed      Assessment & Plan:  With potential infectious element 1 Rocephin injection.  Increase Lasix as directed.[name] Appropriate blood work.  Also grief of loss of spouse earlier this week certainly contributing.  Utilize nebulizer 4 times daily.  Symptom care discussed  Greater than 50% of this 25 minute face to face visit was spent in counseling and discussion and coordination of care regarding the above diagnosis/diagnosies

## 2017-11-04 DIAGNOSIS — R5383 Other fatigue: Secondary | ICD-10-CM | POA: Diagnosis not present

## 2017-11-04 DIAGNOSIS — Z79899 Other long term (current) drug therapy: Secondary | ICD-10-CM | POA: Diagnosis not present

## 2017-11-04 DIAGNOSIS — M79673 Pain in unspecified foot: Secondary | ICD-10-CM | POA: Diagnosis not present

## 2017-11-06 LAB — BASIC METABOLIC PANEL
BUN/Creatinine Ratio: 11 (ref 10–24)
BUN: 14 mg/dL (ref 8–27)
CALCIUM: 9.2 mg/dL (ref 8.6–10.2)
CHLORIDE: 98 mmol/L (ref 96–106)
CO2: 24 mmol/L (ref 20–29)
Creatinine, Ser: 1.3 mg/dL — ABNORMAL HIGH (ref 0.76–1.27)
GFR calc non Af Amer: 50 mL/min/{1.73_m2} — ABNORMAL LOW (ref >59)
GFR, EST AFRICAN AMERICAN: 58 mL/min/{1.73_m2} — AB (ref >59)
GLUCOSE: 194 mg/dL — AB (ref 65–99)
POTASSIUM: 4.1 mmol/L (ref 3.5–5.2)
Sodium: 139 mmol/L (ref 134–144)

## 2017-11-06 LAB — VITAMIN D 25 HYDROXY (VIT D DEFICIENCY, FRACTURES): VIT D 25 HYDROXY: 42.1 ng/mL (ref 30.0–100.0)

## 2017-11-06 LAB — FERRITIN: FERRITIN: 423 ng/mL — AB (ref 30–400)

## 2017-11-06 LAB — URIC ACID: Uric Acid: 4.3 mg/dL (ref 3.7–8.6)

## 2017-11-20 ENCOUNTER — Ambulatory Visit (INDEPENDENT_AMBULATORY_CARE_PROVIDER_SITE_OTHER): Payer: PPO | Admitting: Family Medicine

## 2017-11-20 VITALS — Ht 73.0 in | Wt 224.1 lb

## 2017-11-20 DIAGNOSIS — Z7901 Long term (current) use of anticoagulants: Secondary | ICD-10-CM | POA: Diagnosis not present

## 2017-11-20 DIAGNOSIS — E1165 Type 2 diabetes mellitus with hyperglycemia: Secondary | ICD-10-CM

## 2017-11-20 DIAGNOSIS — I4891 Unspecified atrial fibrillation: Secondary | ICD-10-CM

## 2017-11-20 LAB — POCT INR: INR: 2.1

## 2017-11-20 MED ORDER — FUROSEMIDE 20 MG PO TABS: ORAL_TABLET | ORAL | 12 refills | Status: DC

## 2017-11-20 MED ORDER — POTASSIUM CHLORIDE ER 20 MEQ PO TBCR: EXTENDED_RELEASE_TABLET | ORAL | 6 refills | Status: DC

## 2017-11-20 NOTE — Patient Instructions (Signed)
Weight daily  If goes up 5 pounds in 1 week to notify us  Recheck kidney test in 2 weeks  Keep coumadin the same  Recheck INR in 4 weeks  Office visit in 8 weeks

## 2017-11-20 NOTE — Progress Notes (Signed)
   Subjective:    Patient ID: [name] [name], male    DOB: [birth year], 84 y.o.   MRN: [id number]  HPI Patient is here today to follow up on long term use of anticoagulants. Pt Inr is  Results for orders placed or performed in visit on 11/20/17  POCT INR  Result Value Ref Range   INR 2.1    Patient was recently placed on Lasix every single day but now is taking it every other day which is the original dose was having some trouble with swelling and shortness of breath chest x-ray did not show any pneumonia actually start to get better currently denies high fever chills sweats denies nausea vomiting diarrhea.  Review of Systems  Constitutional: Negative for activity change, fatigue and fever.  Respiratory: Negative for cough and shortness of breath.   Cardiovascular: Negative for chest pain and leg swelling.  Neurological: Negative for headaches.       Objective:   Physical Exam  Constitutional: He appears well-nourished. No distress.  Cardiovascular: Normal rate and normal heart sounds.  No murmur heard. Pulmonary/Chest: Effort normal and breath sounds normal. No respiratory distress.  Musculoskeletal: He exhibits no edema.  Lymphadenopathy:    He has no cervical adenopathy.  Neurological: He is alert.  Psychiatric: His behavior is normal.  Vitals reviewed.         Assessment & Plan:  Atrial fibrillation History CHF Fra[facility] health Patient on Coumadin INR good control I recommend furosemide be 1 in the morning on Monday Wednesday and Fridays Potassium may be used on these 3 days as well  Recheck INR in 4 weeks Recheck office visit 8 weeks Lab work in 2-3 weeks Poorly controlled diabetes hopefully A1c is starting to look better

## 2017-11-21 ENCOUNTER — Other Ambulatory Visit: Payer: Self-pay | Admitting: *Deleted

## 2017-11-21 ENCOUNTER — Telehealth: Payer: Self-pay | Admitting: Family Medicine

## 2017-11-21 MED ORDER — POTASSIUM CHLORIDE ER 20 MEQ PO TBCR: EXTENDED_RELEASE_TABLET | ORAL | 6 refills | Status: DC

## 2017-11-21 NOTE — Telephone Encounter (Signed)
Requesting Rx for Potassium to [facility].

## 2017-11-21 NOTE — Telephone Encounter (Signed)
Med resent. Pt's niece [name] notified.

## 2017-11-21 NOTE — Telephone Encounter (Signed)
Was seen 11/20/17 and dose changes to 1 tab on M, W, and F only

## 2017-11-21 NOTE — Telephone Encounter (Signed)
This was sent in yesterday.  If necessary send it in again

## 2017-11-22 ENCOUNTER — Telehealth: Payer: Self-pay | Admitting: Family Medicine

## 2017-11-22 NOTE — Telephone Encounter (Signed)
I spoke with Nira Conn at the [facility] made her aware we had sent that rx by fax.She states they do not have the rx. I gave her a verbal over the phone for a meter,strips# 50 with 5 refills and lancets # 100. She will have this delivered to the pt by 1 pm tomorrow. Niece is aware.

## 2017-11-22 NOTE — Telephone Encounter (Signed)
Pt was told that a prescription for a glucose machine and supplies would be sent over to [place]. Pt is needing that asap.    [facility]

## 2017-11-28 DIAGNOSIS — E1165 Type 2 diabetes mellitus with hyperglycemia: Secondary | ICD-10-CM | POA: Diagnosis not present

## 2017-11-29 ENCOUNTER — Encounter: Payer: Self-pay | Admitting: Family Medicine

## 2017-11-29 LAB — BASIC METABOLIC PANEL
BUN/Creatinine Ratio: 17 (ref 10–24)
BUN: 19 mg/dL (ref 8–27)
CALCIUM: 9.5 mg/dL (ref 8.6–10.2)
CO2: 21 mmol/L (ref 20–29)
CREATININE: 1.1 mg/dL (ref 0.76–1.27)
Chloride: 99 mmol/L (ref 96–106)
GFR calc Af Amer: 71 mL/min/{1.73_m2} (ref >59)
GFR, EST NON AFRICAN AMERICAN: 62 mL/min/{1.73_m2} (ref >59)
GLUCOSE: 284 mg/dL — AB (ref 65–99)
Potassium: 4.5 mmol/L (ref 3.5–5.2)
SODIUM: 138 mmol/L (ref 134–144)

## 2017-11-29 LAB — HEMOGLOBIN A1C
Est. average glucose Bld gHb Est-mCnc: 177 mg/dL
Hgb A1c MFr Bld: 7.8 % — ABNORMAL HIGH (ref 4.8–5.6)

## 2017-12-04 ENCOUNTER — Telehealth: Payer: Self-pay | Admitting: Family Medicine

## 2017-12-04 NOTE — Telephone Encounter (Signed)
I do not want BA to take this-please discontinue Trazadone on med list, tell [name] this and also notify his pharmacy this was discontinued so they dont keep sending automatic refills.

## 2017-12-04 NOTE — Telephone Encounter (Signed)
Patients niece, [name], called regarding medication list that was on last AVS.  She said she noticed that Trazodone was on the med list and she thought Dr. Nicki Reaper took him off this medication and wanted to clarify.

## 2017-12-05 NOTE — Telephone Encounter (Signed)
Discontinued on the medication list. I have notified [name] the niece. I will call [facility] once they are open and cx.

## 2017-12-05 NOTE — Telephone Encounter (Signed)
Heather at [facility] is aware and will remove/discontinue from medication list/refills.

## 2017-12-18 DIAGNOSIS — E119 Type 2 diabetes mellitus without complications: Secondary | ICD-10-CM | POA: Diagnosis not present

## 2017-12-20 ENCOUNTER — Ambulatory Visit (INDEPENDENT_AMBULATORY_CARE_PROVIDER_SITE_OTHER): Payer: PPO

## 2017-12-20 DIAGNOSIS — Z7901 Long term (current) use of anticoagulants: Secondary | ICD-10-CM

## 2017-12-20 LAB — POCT INR: INR: 3.1

## 2018-01-04 ENCOUNTER — Ambulatory Visit: Payer: PPO | Admitting: Cardiovascular Disease

## 2018-01-12 ENCOUNTER — Encounter: Payer: Self-pay | Admitting: Cardiovascular Disease

## 2018-01-12 ENCOUNTER — Ambulatory Visit: Payer: PPO | Admitting: Cardiovascular Disease

## 2018-01-12 VITALS — BP 120/70 | HR 73 | Ht 73.0 in | Wt 237.2 lb

## 2018-01-12 DIAGNOSIS — I5022 Chronic systolic (congestive) heart failure: Secondary | ICD-10-CM | POA: Diagnosis not present

## 2018-01-12 DIAGNOSIS — I4821 Permanent atrial fibrillation: Secondary | ICD-10-CM

## 2018-01-12 DIAGNOSIS — Z952 Presence of prosthetic heart valve: Secondary | ICD-10-CM

## 2018-01-12 DIAGNOSIS — I482 Chronic atrial fibrillation: Secondary | ICD-10-CM | POA: Diagnosis not present

## 2018-01-12 NOTE — Progress Notes (Signed)
Cardiology Office Note Date:  01/12/2018   ID:  [name] [name], DOB April 29, 1932, MRN [id number]  PCP:  Kathyrn Drown, MD  Cardiologist:  Sherren Mocha, MD    Chief Complaint  Patient presents with  . Follow-up    CHF/TAVR     History of Present Illness: [name] [name] is a 84 y.o. male who presents for follow-up evaluation.  Patient has a history of aortic valve disease status post TAVR in September 2017 for treatment of severe low flow low gradient aortic stenosis.  He was treated with a 29 mm Sapien 3 valve.  Since undergoing TAVR, LV function has been moderately depressed with an LVEF in the range of 40%.  His aortic valve prosthesis is functioning normally with most recent echo demonstrating no paravalvular regurgitation and a mean transvalvular gradient of 5 mmHg.  He's most limited by back pain. States breathing is ok. No chest pain. He had some swelling in December 2018 and lasix was increased. He's done well since then.  Denies any shortness of breath, chest pain, orthopnea, PND, or leg swelling.  Past Medical History:  Diagnosis Date  . Aortic stenosis    a. mod-sev by echo 05/2016.  . Asthma   . Cerebrovascular disease 07/2010   TIA; carotid ultrasound in 07/2010-significant bilateral plaque without focal internal carotid artery stenosis; MRI -encephalomalacia left temporal and right temporal lobes; small inferior right cerebellar infarct; small vessel disease  . Chronic atrial fibrillation (HCC)    Paroxysmal; Echocardiogram in 2007-normal EF; mild LVH; left atrial enlargement; mild stenosis and minimal AI; negative stress nuclear study in 2008  . Chronic systolic CHF (congestive heart failure) ([place])    a. dx 05/2016 - EF 35-40%, diffuse HK, mod-severe AS, mod gradient, severe AVA VTI likely due to decreased cardiac output in setting of systolic dysfsunction and significant mitral regurgitation, mild MR, mod-severe MR, severe LAE, mild-mod RV dilation, mild RAE, mild-mod TR,  mod PASP 25mmHg.  . CKD (chronic kidney disease), stage II   . Degenerative joint disease    feet and legs  . Diabetes mellitus    no insulin; A1c of 6.6 in 2005  . Dizziness    occurs daily,especially in am  . Exertional dyspnea   . Gastroesophageal reflux disease   . Hepatic steatosis   . History of noncompliance with medical treatment   . Hyperlipidemia    adverse reactions to statins and niacin  . Hypertension    Borderline  . Irregular heartbeat   . Mitral regurgitation    a. mod-sev by echo 05/2016  . Peripheral vascular disease ([place])   . Renal insufficiency   . S/P TAVR (transcatheter aortic valve replacement) 07/19/2016   29 mm Edwards Sapien 3 transcatheter heart valve placed via left percutaneous transfemoral approach  . Temporal arteritis ([place])   . Tricuspid regurgitation    a. mild-mod TR by echo 05/2016    Past Surgical History:  Procedure Laterality Date  . COLONOSCOPY  2002  . COLONOSCOPY  01/19/2012   Procedure: COLONOSCOPY;  Surgeon: Rogene Houston, MD;  Location[facility];  Service: Endoscopy;  Laterality: N/A;  1030  . LIPOMA EXCISION  1980  . ORIF ANKLE FRACTURE  2000   Right  . PERIPHERAL VASCULAR CATHETERIZATION N/A 07/11/2016   Procedure: Carotid PTA/Stent Intervention;  Surgeon: Lorretta Harp, MD;  Location: [place] [facility];  Service: Cardiovascular;  Laterality: N/A;  . PROSTATE SURGERY  12/2011  . ROTATOR CUFF REPAIR  Right  . TEE WITHOUT CARDIOVERSION N/A 06/17/2016   Procedure: TRANSESOPHAGEAL ECHOCARDIOGRAM (TEE);  Surgeon: Jerline Pain, MD;  Location: [place];  Service: Cardiovascular;  Laterality: N/A;  . TEE WITHOUT CARDIOVERSION N/A 07/19/2016   Procedure: TRANSESOPHAGEAL ECHOCARDIOGRAM (TEE);  Surgeon: Sherren Mocha, MD;  Location: [place];  Service: Open Heart Surgery;  Laterality: N/A;  . TRANSCATHETER AORTIC VALVE REPLACEMENT, TRANSFEMORAL N/A 07/19/2016   Procedure: TRANSCATHETER AORTIC VALVE REPLACEMENT, TRANSFEMORAL;   Surgeon: Sherren Mocha, MD;  Location: [place];  Service: Open Heart Surgery;  Laterality: N/A;  . TRANSURETHRAL RESECTION OF PROSTATE  09/2011  . URETHRAL STRICTURE DILATATION  1980s    Current Outpatient Medications  Medication Sig Dispense Refill  . albuterol (PROVENTIL) (2.5 MG/3ML) 0.083% nebulizer solution Take 3 mLs (2.5 mg total) by nebulization every 6 (six) hours as needed for wheezing or shortness of breath. 150 mL 3  . bisoprolol (ZEBETA) 5 MG tablet TAKE (1/2) TABLET BY MOUTH ONCE DAILY. 14 tablet 11  . furosemide (LASIX) 20 MG tablet Take 20 mg by mouth as directed. On Mondays, Wednesday, and Fridays only    . losartan (COZAAR) 25 MG tablet TAKE 1/2 TABLET BY MOUTH DAILY 14 tablet 11  . nortriptyline (PAMELOR) 10 MG capsule Take 10-20 mg by mouth at bedtime.    . potassium chloride SA (K-DUR,KLOR-CON) 20 MEQ tablet Take 20 mEq by mouth as directed. On Mondays, Wednesdays, and Fridays only    . rosuvastatin (CRESTOR) 5 MG tablet TAKE (1) TABLET BY MOUTH ONCE DAILY. 28 tablet 10  . sitaGLIPtin (JANUVIA) 100 MG tablet Take 1 tablet (100 mg total) daily by mouth. 30 tablet 5  . warfarin (COUMADIN) 6 MG tablet Take 6 mg by mouth as directed.     No current facility-administered medications for this visit.     Allergies:   Ambien [zolpidem tartrate]; Lipitor [atorvastatin calcium]; Ranitidine; Simvastatin; Xanax xr [alprazolam er]; Cholestatin; and Clopidogrel bisulfate   Social History:  The patient  reports that he quit smoking about 25 years ago. His smoking use included cigarettes. He started smoking about 67 years ago. He has a 20.00 pack-year smoking history. His smokeless tobacco use includes chew. He reports that he does not drink alcohol or use drugs.   Family History:  The patient's family history includes Diabetes in his father; Stroke in his mother.    ROS:  Please see the history of present illness.  Otherwise, review of systems is positive for back pain.  All other  systems are reviewed and negative.    PHYSICAL EXAM: VS:  BP 120/70   Pulse 73   Ht 6\' 1"  (1.854 m)   Wt 237 lb 3.2 oz (107.6 kg)   BMI 31.29 kg/m  , BMI Body mass index is 31.29 kg/m. GEN: Well nourished, well developed, in no acute distress  HEENT: normal  Neck: no JVD, no masses. No carotid bruits Cardiac: irregularly irregular without murmur or gallop                Respiratory:  clear to auscultation bilaterally, normal work of breathing GI: soft, nontender, nondistended, + BS MS: no deformity or atrophy  Ext: no pretibial edema Skin: warm and dry, no rash Neuro:  Strength and sensation are intact Psych: euthymic mood, full affect  EKG:  EKG is ordered today. The ekg ordered today shows atrial fibrillation 73 bpm, left axis deviation, nonspecific IVCD.  Recent Labs: 02/09/2017: TSH 15.316 10/23/2017: ALT 19; B Natriuretic Peptide 157.0; Hemoglobin 15.0;  Platelets 231 11/28/2017: BUN 19; Creatinine, Ser 1.10; Potassium 4.5; Sodium 138   Lipid Panel     Component Value Date/Time   CHOL 159 03/28/2017 0903   TRIG 160 (H) 03/28/2017 0903   HDL 40 03/28/2017 0903   CHOLHDL 4.0 03/28/2017 0903   CHOLHDL 5.9 08/01/2014 1003   VLDL 49 (H) 08/01/2014 1003   LDLCALC 87 03/28/2017 0903      Wt Readings from Last 3 Encounters:  01/12/18 237 lb 3.2 oz (107.6 kg)  11/20/17 224 lb 0.8 oz (101.6 kg)  10/19/17 242 lb (109.8 kg)     Cardiac Studies Reviewed: 2D echocardiogram 11/01/2017: Study Conclusions  - Left ventricle: The cavity size was normal. Wall thickness was   increased in a pattern of mild LVH. Systolic function was   moderately reduced. The estimated ejection fraction was in the   range of 35% to 40%. Diffuse hypokinesis. The study is not   technically sufficient to allow evaluation of LV diastolic   function. - Aortic valve: 29 mm Edwards Sapien 3 transcatheter heart valve in   aortic postion. There was no significant regurgitation. Mean   gradient (S):  5 mm Hg. - Mitral valve: Mildly calcified annulus. Mildly thickened, mildly   calcified leaflets . There was mild regurgitation. - Left atrium: The atrium was moderately to severely dilated. - Right atrium: The atrium was mildly dilated. Central venous   pressure (est): 3 mm Hg. - Tricuspid valve: There was mild regurgitation. - Pulmonary arteries: PA peak pressure: 44 mm Hg (S). - Pericardium, extracardiac: There was no pericardial effusion.  Impressions:  - Mild LVH with LVEF approximately 35-40%. There is diffuse   hypokinesis with regional variation. Indeterminate diastolic   function in the setting of atrial fibrillation. Moderate to   severe left atrial enlargement. Mildly calcified mitral annulus   as well as thickened and calcified leaflets. Mild mitral   regurgitation. Bioprosthetic aortic valve is stable in position   without aortic regurgitation and stable gradients compared to the   previous study. Mild tricuspid regurgitation with PASP estimated   44 mmHg.   ASSESSMENT AND PLAN: 1.  Chronic systolic heart failure: The patient appears stable on a combination of bisoprolol, furosemide, and losartan.  No changes are made in his medications today.  2.  Permanent atrial fibrillation: Continue bisoprolol for rate control and warfarin for anticoagulation.  3.  Aortic valve disease status post TAVR: The patient appears to be stable and I reviewed his most recent echo which demonstrated normal function of his transcatheter heart valve.  He should follow lifelong SBE prophylaxis.  4.  Mitral regurgitation: Primarily functional MR with marked improvement after TAVR and medical therapy for heart failure.  5.  Carotid stenosis: Most recent Doppler study reviewed with a patent right internal carotid artery stent and moderate stenosis of the left internal carotid artery.  Current medicines are reviewed with the patient today.  The patient does not have concerns regarding  medicines.  Labs/ tests ordered today include:   Orders Placed This Encounter  Procedures  . EKG 12-Lead    Disposition:   FU 1 year  Signed, Sherren Mocha, MD  01/12/2018 5:11 PM    [facility] [place], [place], [place]  [address] Phone: [phone]; Fax: [phone]

## 2018-01-12 NOTE — Patient Instructions (Signed)

## 2018-01-15 ENCOUNTER — Ambulatory Visit (INDEPENDENT_AMBULATORY_CARE_PROVIDER_SITE_OTHER): Payer: PPO | Admitting: Family Medicine

## 2018-01-15 VITALS — Ht 73.0 in | Wt 236.0 lb

## 2018-01-15 DIAGNOSIS — I10 Essential (primary) hypertension: Secondary | ICD-10-CM

## 2018-01-15 DIAGNOSIS — Z7901 Long term (current) use of anticoagulants: Secondary | ICD-10-CM

## 2018-01-15 DIAGNOSIS — I482 Chronic atrial fibrillation, unspecified: Secondary | ICD-10-CM

## 2018-01-15 LAB — POCT INR: INR: 2.9

## 2018-01-15 NOTE — Progress Notes (Signed)
   Subjective:    Patient ID: [name] [name], male    DOB: Dec 04, 1931, 84 y.o.   MRN: [id number]  HPI Patient is here today to follow up on anticoagulation. He is on  Coumadin 6 mg on M,W,F and 1/2 tablet on all other days. No complaints or concerns. Patient occasionally gets a little unsteady when he walks he uses a cane and a walker he denies passing out spells. Denies any chest tightness pressure pain Denies rectal bleeding Brings in sugar readings are mainly no low sugar spells Review of Systems  Constitutional: Negative for activity change, appetite change and fatigue.  HENT: Negative for congestion and rhinorrhea.   Respiratory: Positive for shortness of breath. Negative for cough and chest tightness.   Cardiovascular: Negative for chest pain and leg swelling.  Gastrointestinal: Negative for abdominal pain, diarrhea and nausea.  Endocrine: Negative for polydipsia and polyphagia.  Genitourinary: Negative for dysuria and hematuria.  Neurological: Negative for weakness and headaches.  Psychiatric/Behavioral: Negative for confusion and dysphoric mood.   Patient gets a little short winded with activity which is normal given his heart disease    Objective:   Physical Exam  Constitutional: He appears well-nourished. No distress.  HENT:  Head: Normocephalic and atraumatic.  Eyes: Right eye exhibits no discharge. Left eye exhibits no discharge.  Neck: No tracheal deviation present.  Cardiovascular: Normal rate and normal heart sounds.  No murmur heard. Pulmonary/Chest: Effort normal and breath sounds normal. No respiratory distress. He has no wheezes.  Musculoskeletal: He exhibits no edema.  Lymphadenopathy:    He has no cervical adenopathy.  Neurological: He is alert.  Skin: Skin is warm. No rash noted.  Psychiatric: His behavior is normal.  Vitals reviewed.         Assessment & Plan:  Diabetes-his numbers are higher than what I would like but at the same time his overall  health would be more adversely affected by low sugar spells therefore keep things as is and we will geared toward check an A1c on next office visit  Anticoagulant stable recheck it again in 1 month  Blood pressure relatively low puts him at risk of accidental falls and passing out, CHF stable, on low dose of losartan, stop losartan currently because of low blood pressure

## 2018-01-25 DIAGNOSIS — E119 Type 2 diabetes mellitus without complications: Secondary | ICD-10-CM | POA: Diagnosis not present

## 2018-02-09 ENCOUNTER — Ambulatory Visit (INDEPENDENT_AMBULATORY_CARE_PROVIDER_SITE_OTHER): Payer: PPO | Admitting: Family Medicine

## 2018-02-09 ENCOUNTER — Encounter: Payer: Self-pay | Admitting: Family Medicine

## 2018-02-09 VITALS — BP 124/80 | Temp 97.4°F | Ht 73.0 in | Wt 237.0 lb

## 2018-02-09 DIAGNOSIS — D239 Other benign neoplasm of skin, unspecified: Secondary | ICD-10-CM | POA: Diagnosis not present

## 2018-02-09 DIAGNOSIS — L299 Pruritus, unspecified: Secondary | ICD-10-CM | POA: Diagnosis not present

## 2018-02-09 MED ORDER — HYDROXYZINE HCL 25 MG PO TABS: 25.0000 mg | ORAL_TABLET | Freq: Four times a day (QID) | ORAL | 0 refills | Status: DC | PRN

## 2018-02-09 MED ORDER — PERMETHRIN 5 % EX CREA: TOPICAL_CREAM | CUTANEOUS | 0 refills | Status: DC

## 2018-02-09 NOTE — Progress Notes (Signed)
   Subjective:    Patient ID: [name] [name], male    DOB: Dec 07, 1931, 84 y.o.   MRN: [id number]  HPIItching all over body. Started a few months ago. Tried benadryl, vaseline lotion, itch relief gel, clear moisture barrier, sarna lotion, aveeno lotion.  Severe itching on the hands forearms also on the legs lower abdomen denies any bleeding issues.  Knot on back of left arm.  This is been present over the past few weeks or longer not causing any pain or discomfort denies any fever chills sweats        Review of Systems Complains of itching complains of nodule in the back of the arm has chronic fatigue tiredness denies any chest pain nausea vomiting    Objective:   Physical Exam  Atrophic noted lungs are clear extremities no edema signific nodule noted on the back of the left arm no particular rash noted on the arms or legs      Assessment & Plan:  Pruritus-hard to tell what is causing this try hydroxyzine for itching we will use Elimite cream if ongoing troubles potentially stop Januvia to see if that may be the cause may need dermatology referral  Has a small nodule on the back of the left arm referral to Dr. Rozann Lesches for further evaluation  Family to give [place] update next week

## 2018-02-10 ENCOUNTER — Other Ambulatory Visit: Payer: Self-pay | Admitting: Family Medicine

## 2018-02-12 ENCOUNTER — Other Ambulatory Visit: Payer: Self-pay | Admitting: Family Medicine

## 2018-02-12 DIAGNOSIS — L299 Pruritus, unspecified: Secondary | ICD-10-CM

## 2018-02-12 NOTE — Progress Notes (Signed)
Referral placed in Epic.

## 2018-02-14 ENCOUNTER — Ambulatory Visit (INDEPENDENT_AMBULATORY_CARE_PROVIDER_SITE_OTHER): Payer: PPO

## 2018-02-14 ENCOUNTER — Telehealth: Payer: Self-pay | Admitting: Family Medicine

## 2018-02-14 DIAGNOSIS — Z7901 Long term (current) use of anticoagulants: Secondary | ICD-10-CM

## 2018-02-14 DIAGNOSIS — L299 Pruritus, unspecified: Secondary | ICD-10-CM

## 2018-02-14 LAB — POCT INR: INR: 2

## 2018-02-14 NOTE — Telephone Encounter (Signed)
As I discussed with the nurse I recommended for the patient to stop Januvia for 1 week if this dramatically improved his itching that would be the source.  I also recommended a dermatology consult.  The patient should give [place] feedback next week if Januvia being stopped helped his itching go away

## 2018-02-14 NOTE — Telephone Encounter (Signed)
Patient seen Dr. Nicki Reaper on 02/09/18 due to an issue with itching.  He tried the permethrin cream and he is still itching.  Please advise.

## 2018-02-14 NOTE — Patient Instructions (Signed)
Keep coumadin the same 1/2 tablet on Sunday,Tues,Thurs,Sat. Whole tablet all other days. Return in four weeks for Inr Nurse recheck. Referral to [place] office for itch. Stop Januvia for one week and call back early next week to let us know how the itch is.Monitor the blood sugars call if increased.

## 2018-02-14 NOTE — Telephone Encounter (Signed)
Patient is aware and instructions were in check out notes.

## 2018-02-19 DIAGNOSIS — L570 Actinic keratosis: Secondary | ICD-10-CM | POA: Diagnosis not present

## 2018-02-19 DIAGNOSIS — C44629 Squamous cell carcinoma of skin of left upper limb, including shoulder: Secondary | ICD-10-CM | POA: Diagnosis not present

## 2018-02-19 DIAGNOSIS — X32XXXD Exposure to sunlight, subsequent encounter: Secondary | ICD-10-CM | POA: Diagnosis not present

## 2018-02-22 ENCOUNTER — Telehealth: Payer: Self-pay | Admitting: Family Medicine

## 2018-02-22 NOTE — Telephone Encounter (Signed)
Nephew called stating that the Dr. Recently took the pt off of his sugar pill and wants to know if he should go back on it. Pt readings since 3/25 have ranged from 173 to 250 in the mornings before breakfast and 185 to 300's before dinner. Please advise.

## 2018-02-23 NOTE — Telephone Encounter (Signed)
His electronic record is not clear-cut.  According to epic currently taking Januvia 100 mg daily.  Is patient currently taking this?  Kindly inquire with family which medication he was taken off of?  Then based upon this information we can make decision accordingly

## 2018-02-23 NOTE — Telephone Encounter (Signed)
Pt was taken off Januvia at the 02/09/18 office visit due to itching. Pt niece states that pt is not itching as much. Please advise.

## 2018-02-25 ENCOUNTER — Other Ambulatory Visit: Payer: Self-pay | Admitting: Family Medicine

## 2018-02-25 NOTE — Telephone Encounter (Signed)
We will try a different medication for his diabetes. Tradjenta , 5 mg, 1 daily, #30, 3 refills-also with the patient and his itching ongoing offered dermatology referral-I believe we had already made one-please inquire with family

## 2018-02-26 ENCOUNTER — Other Ambulatory Visit: Payer: Self-pay | Admitting: *Deleted

## 2018-02-26 MED ORDER — LINAGLIPTIN 5 MG PO TABS: 5.0000 mg | ORAL_TABLET | Freq: Every day | ORAL | 3 refills | Status: DC

## 2018-02-26 NOTE — Telephone Encounter (Signed)
Discussed with steve. Med sent to pharm. Richardson Landry states pt saw dermatology last week. I asked how the itching was doing and he did not know. Advised to call back if any issues or concerns.

## 2018-02-28 ENCOUNTER — Telehealth: Payer: Self-pay | Admitting: Family Medicine

## 2018-02-28 NOTE — Telephone Encounter (Signed)
We sent Rx for Tradjenta to [facility] for BA to start.  It is over $200.  His nephew wants to know if there is an alternative?  [facility]

## 2018-03-01 ENCOUNTER — Other Ambulatory Visit: Payer: Self-pay | Admitting: *Deleted

## 2018-03-01 MED ORDER — METFORMIN HCL ER 500 MG PO TB24: 500.0000 mg | ORAL_TABLET | Freq: Every day | ORAL | 4 refills | Status: DC

## 2018-03-01 NOTE — Telephone Encounter (Signed)
Left message to return call 

## 2018-03-01 NOTE — Telephone Encounter (Signed)
Please discuss with the nephew.  We can discontinue the order for Tradjenta due to the cost of medication.  The patient did not tolerate regular metformin but it would be reasonable to try extended release metformin because often that is better tolerated.  Can sometimes cause diarrhea or nausea.  If it causes significant issues there we will need to try something different.  I would recommend metformin XR 500 mg, 1 daily, follow sugars twice daily.  Follow-up office visit within 1 month's time, bring sugar readings to the visit, if any problems with medication let us know, #30, 4 refills

## 2018-03-01 NOTE — Telephone Encounter (Signed)
Discussed with pt's [name]. [name] [name] verbalized understanding. New med sent to pharm. Med list updated.

## 2018-03-09 ENCOUNTER — Telehealth: Payer: Self-pay | Admitting: Family Medicine

## 2018-03-09 NOTE — Telephone Encounter (Signed)
His aid called because she is wondering if his metformin that was increased to 500 mg. Could be causing dizziness?  She gave me the phone number for [facility] [phone] but we don't have that in the system.

## 2018-03-09 NOTE — Telephone Encounter (Signed)
[name] [name] is aware of all and the pt has an appointment with Dr.Scott on Wednesday.

## 2018-03-09 NOTE — Telephone Encounter (Signed)
Per Eunice Blase pt is having some dizziness when standing mostly. His Blood pressures and blood sugars are doing good for the most part and the bp are done while sitting.No passing out,no headaches. Seems to have started after increase in the Metformin.Per Aid [facility] Bs 226 today Sitting bp 125/77pulse 75 Standing bp is 118/83 pulse 75 checked while experiencing dizziness. Per Stanton Kidney the aide we changed the blood sugar medication Januvia 100 to Metformin 500 due to itching. Please advise.

## 2018-03-09 NOTE — Telephone Encounter (Signed)
I doubt the dizziness is due to his sugar level or metformin, obviously he needs to take care with moving about,he will need to be seen next week- rec ED if worse

## 2018-03-12 DIAGNOSIS — E119 Type 2 diabetes mellitus without complications: Secondary | ICD-10-CM | POA: Diagnosis not present

## 2018-03-13 DIAGNOSIS — Z961 Presence of intraocular lens: Secondary | ICD-10-CM | POA: Diagnosis not present

## 2018-03-13 DIAGNOSIS — H353131 Nonexudative age-related macular degeneration, bilateral, early dry stage: Secondary | ICD-10-CM | POA: Diagnosis not present

## 2018-03-13 DIAGNOSIS — H26493 Other secondary cataract, bilateral: Secondary | ICD-10-CM | POA: Diagnosis not present

## 2018-03-13 DIAGNOSIS — E119 Type 2 diabetes mellitus without complications: Secondary | ICD-10-CM | POA: Diagnosis not present

## 2018-03-13 LAB — HM DIABETES EYE EXAM

## 2018-03-14 ENCOUNTER — Ambulatory Visit (INDEPENDENT_AMBULATORY_CARE_PROVIDER_SITE_OTHER): Payer: PPO | Admitting: Family Medicine

## 2018-03-14 ENCOUNTER — Encounter: Payer: Self-pay | Admitting: Family Medicine

## 2018-03-14 VITALS — BP 90/66 | Ht 73.0 in | Wt 233.0 lb

## 2018-03-14 DIAGNOSIS — E0800 Diabetes mellitus due to underlying condition with hyperosmolarity without nonketotic hyperglycemic-hyperosmolar coma (NKHHC): Secondary | ICD-10-CM

## 2018-03-14 DIAGNOSIS — Z7901 Long term (current) use of anticoagulants: Secondary | ICD-10-CM

## 2018-03-14 DIAGNOSIS — I482 Chronic atrial fibrillation, unspecified: Secondary | ICD-10-CM

## 2018-03-14 DIAGNOSIS — Z952 Presence of prosthetic heart valve: Secondary | ICD-10-CM

## 2018-03-14 DIAGNOSIS — Z794 Long term (current) use of insulin: Secondary | ICD-10-CM | POA: Diagnosis not present

## 2018-03-14 DIAGNOSIS — N289 Disorder of kidney and ureter, unspecified: Secondary | ICD-10-CM | POA: Diagnosis not present

## 2018-03-14 LAB — POCT INR: INR: 1.5

## 2018-03-14 LAB — POCT GLYCOSYLATED HEMOGLOBIN (HGB A1C): HEMOGLOBIN A1C: 9.3

## 2018-03-14 NOTE — Patient Instructions (Signed)
Change the the coumadin level to 6 mg 1/2 tablet on Sunday,Tues,Thurs,One whole tablet on All other days. Recheck in 2 weeks Inr/Nurse visit.

## 2018-03-14 NOTE — Progress Notes (Signed)
Subjective:    Patient ID: [name] [name], male    DOB: 02-21-1932, 84 y.o.   MRN: [id number]  HPI  Patient is here today to follow up on his many health issues.  Patient sugars have been under very poor control recently they have been in the 200-300 range the nephew brings in some readings over the past several weeks with to verify this patient for the most part tries to eat relatively healthy but does consume some foods that are higher in starches and sugars he is on metformin he is not on any other medications because he could not afford these and glyburide is not a good choice for him because of his age   He has states he has been having dizzy spells when blood sugar is high or low. He has had his A1c done today as well his pt INR.  He has been experiencing some dizziness and feeling unsteady at times no falls no injuries.  Denies any underlying issues.  Denies any headaches.  Patient has been told to avoid driving to let his family do this but he is allowed to do yard work and cutting grass  Patient is on Coumadin takes it on a regular basis but recently his INR is at 1.5 he denies any bleeding issues.  Review of Systems  Constitutional: Negative for activity change, appetite change and fatigue.  HENT: Negative for congestion and rhinorrhea.   Respiratory: Negative for cough, chest tightness and shortness of breath.   Cardiovascular: Negative for chest pain and leg swelling.  Gastrointestinal: Negative for abdominal pain, diarrhea and nausea.  Endocrine: Positive for polydipsia and polyuria. Negative for polyphagia.  Genitourinary: Positive for frequency. Negative for discharge, dysuria and hematuria.  Neurological: Negative for weakness and headaches.  Psychiatric/Behavioral: Negative for confusion and dysphoric mood.       Results for orders placed or performed in visit on 03/14/18  POCT glycosylated hemoglobin (Hb A1C)  Result Value Ref Range   Hemoglobin A1C 9.3   POCT  INR  Result Value Ref Range   INR 1.5     Objective:   Physical Exam  Constitutional: He appears well-nourished. No distress.  Cardiovascular: Normal rate and normal heart sounds.  No murmur heard. Pulmonary/Chest: Effort normal and breath sounds normal. No respiratory distress.  Musculoskeletal: He exhibits no edema.  Lymphadenopathy:    He has no cervical adenopathy.  Neurological: He is alert.  Psychiatric: His behavior is normal.  Vitals reviewed.    25 minutes was spent with the patient.  This statement verifies that 25 minutes was indeed spent with the patient. Greater than half the time was spent in discussion, counseling and answering questions  regarding the issues that the patient came in for today as reflected in the diagnosis (s) please refer to documentation for further details. Greater than half time spent discussing insulin how to do shots plus also adjustment of Coumadin     Assessment & Plan:  Poorly controlled diabetes We did discuss in detail how this puts him at significant risk of complications At the same time we are not striving for tight control because of his age Continue the metformin but we do need to check a metabolic 7 to make sure his kidneys are getting along with this We will also go ahead with insulin but his nephew will check with the insurance company to find out which long-acting insulin is covered he will let us know then we will start at a low  dosage and adjust on a weekly basis  Subpar control with his Coumadin he will need to go ahead and adjust up on the dose recheck an INR again in 2 weeks  Orthostatic hypotension he is already on a low amount of medication I recommend the patient be allowed to use some salt to help his blood pressure stay reasonable and to prevent dizziness  Patient has a history of atrial fibrillation rate is under good control

## 2018-03-15 ENCOUNTER — Telehealth: Payer: Self-pay | Admitting: Family Medicine

## 2018-03-15 LAB — BASIC METABOLIC PANEL
BUN/Creatinine Ratio: 17 (ref 10–24)
BUN: 24 mg/dL (ref 8–27)
CO2: 23 mmol/L (ref 20–29)
CREATININE: 1.43 mg/dL — AB (ref 0.76–1.27)
Calcium: 9.9 mg/dL (ref 8.6–10.2)
Chloride: 97 mmol/L (ref 96–106)
GFR calc Af Amer: 52 mL/min/{1.73_m2} — ABNORMAL LOW (ref >59)
GFR calc non Af Amer: 45 mL/min/{1.73_m2} — ABNORMAL LOW (ref >59)
GLUCOSE: 326 mg/dL — AB (ref 65–99)
Potassium: 5.5 mmol/L — ABNORMAL HIGH (ref 3.5–5.2)
Sodium: 136 mmol/L (ref 134–144)

## 2018-03-15 LAB — CBC WITH DIFFERENTIAL/PLATELET
Basophils Absolute: 0.1 10*3/uL (ref 0.0–0.2)
Basos: 1 %
EOS (ABSOLUTE): 0.3 10*3/uL (ref 0.0–0.4)
EOS: 4 %
HEMATOCRIT: 48.1 % (ref 37.5–51.0)
Hemoglobin: 16.3 g/dL (ref 13.0–17.7)
IMMATURE GRANULOCYTES: 1 %
Immature Grans (Abs): 0.1 10*3/uL (ref 0.0–0.1)
Lymphocytes Absolute: 1.1 10*3/uL (ref 0.7–3.1)
Lymphs: 16 %
MCH: 30.4 pg (ref 26.6–33.0)
MCHC: 33.9 g/dL (ref 31.5–35.7)
MCV: 90 fL (ref 79–97)
MONOCYTES: 6 %
Monocytes Absolute: 0.4 10*3/uL (ref 0.1–0.9)
Neutrophils Absolute: 4.9 10*3/uL (ref 1.4–7.0)
Neutrophils: 72 %
Platelets: 210 10*3/uL (ref 150–379)
RBC: 5.36 x10E6/uL (ref 4.14–5.80)
RDW: 14.2 % (ref 12.3–15.4)
WBC: 6.8 10*3/uL (ref 3.4–10.8)

## 2018-03-15 NOTE — Telephone Encounter (Signed)
I recommend Lantus pen, start off with 6 units each evening, send [place] glucose readings after 1 week, will need to send in EpiPen with instructions to start at 6 units daily, may titrate up to 20 units only upon Dr. direction, may have 1 month along with 5 refills, will need needle tips for the pin

## 2018-03-15 NOTE — Telephone Encounter (Signed)
Patients nephew said that Dr. Nicki Reaper gave him a list of medication names to check into that insurance will cover.  He said he would like Lantus called in.  Also, will he continue the Metformin if he starts this?  [place]

## 2018-03-16 MED ORDER — INSULIN GLARGINE 100 UNIT/ML SOLOSTAR PEN: PEN_INJECTOR | SUBCUTANEOUS | 0 refills | Status: DC

## 2018-03-16 NOTE — Telephone Encounter (Signed)
Spoke with Steve(POA) and advised Dr Nicki Reaper recommends Lantus pen, start off with 6 units each evening, send [place] glucose readings after 1 week, will need to send in Lantus with instructions to start at 6 units daily, may titrate up to 20 units only upon Dr. direction, may have 1 month along with 5 refills, will need needle tips for the pin. Richardson Landry verbalized understanding. Prescription sent electronically to pharmacy.

## 2018-03-21 ENCOUNTER — Encounter: Payer: Self-pay | Admitting: *Deleted

## 2018-03-21 ENCOUNTER — Ambulatory Visit: Payer: PPO | Admitting: Family Medicine

## 2018-03-26 DIAGNOSIS — H26492 Other secondary cataract, left eye: Secondary | ICD-10-CM | POA: Diagnosis not present

## 2018-03-26 DIAGNOSIS — Z961 Presence of intraocular lens: Secondary | ICD-10-CM | POA: Diagnosis not present

## 2018-03-28 ENCOUNTER — Ambulatory Visit (INDEPENDENT_AMBULATORY_CARE_PROVIDER_SITE_OTHER): Payer: PPO | Admitting: *Deleted

## 2018-03-28 ENCOUNTER — Telehealth: Payer: Self-pay | Admitting: *Deleted

## 2018-03-28 DIAGNOSIS — Z7901 Long term (current) use of anticoagulants: Secondary | ICD-10-CM

## 2018-03-28 LAB — POCT INR: INR: 2

## 2018-03-28 NOTE — Telephone Encounter (Signed)
Pt dropped off blood sugar readings while at nurse visit today. Richardson Landry states pt takes 6 units of lantus at night. Reading placed in dr scott's folder for review

## 2018-03-28 NOTE — Patient Instructions (Signed)
Continue coumadin same dose. 6mg  tablets. Take one whole tablet on mondays, wednesdays, fridays, and saturdays. Take one half tablet on sundays, tuesdays, and thursdays. Recheck INR in 4 weeks.

## 2018-03-29 NOTE — Telephone Encounter (Signed)
It would be fine to continue the metformin.  Increase insulin from 6 units to a new dosage of 8 units daily.  Send [place] readings in a couple weeks.  Please make change in epic that he is taking 8 units on the medication last

## 2018-03-30 ENCOUNTER — Other Ambulatory Visit: Payer: Self-pay | Admitting: *Deleted

## 2018-03-30 MED ORDER — INSULIN GLARGINE 100 UNIT/ML SOLOSTAR PEN: PEN_INJECTOR | SUBCUTANEOUS | 0 refills | Status: DC

## 2018-03-30 NOTE — Telephone Encounter (Signed)
Discussed with pt's niece [name]. She verbalized understanding. New dose sent to [facility]. [name] states they will drop off blood sugar readings in a couple of weeks.

## 2018-04-20 ENCOUNTER — Other Ambulatory Visit: Payer: Self-pay | Admitting: Family Medicine

## 2018-04-24 DIAGNOSIS — E119 Type 2 diabetes mellitus without complications: Secondary | ICD-10-CM | POA: Diagnosis not present

## 2018-04-25 ENCOUNTER — Ambulatory Visit (INDEPENDENT_AMBULATORY_CARE_PROVIDER_SITE_OTHER): Payer: PPO | Admitting: *Deleted

## 2018-04-25 DIAGNOSIS — Z7901 Long term (current) use of anticoagulants: Secondary | ICD-10-CM

## 2018-04-25 LAB — POCT INR: INR: 2.2 (ref 2.0–3.0)

## 2018-04-25 NOTE — Patient Instructions (Signed)
Take half tablet on Sundays,Tuesdays and thursdays. Take one whole pill Mon,Wed,Friday and Saturday. Recheck INR in 4 weeks.

## 2018-05-14 DIAGNOSIS — E119 Type 2 diabetes mellitus without complications: Secondary | ICD-10-CM | POA: Diagnosis not present

## 2018-05-21 DIAGNOSIS — Z85828 Personal history of other malignant neoplasm of skin: Secondary | ICD-10-CM | POA: Diagnosis not present

## 2018-05-21 DIAGNOSIS — L570 Actinic keratosis: Secondary | ICD-10-CM | POA: Diagnosis not present

## 2018-05-21 DIAGNOSIS — X32XXXD Exposure to sunlight, subsequent encounter: Secondary | ICD-10-CM | POA: Diagnosis not present

## 2018-05-21 DIAGNOSIS — C44222 Squamous cell carcinoma of skin of right ear and external auricular canal: Secondary | ICD-10-CM | POA: Diagnosis not present

## 2018-05-21 DIAGNOSIS — Z08 Encounter for follow-up examination after completed treatment for malignant neoplasm: Secondary | ICD-10-CM | POA: Diagnosis not present

## 2018-05-23 ENCOUNTER — Ambulatory Visit (INDEPENDENT_AMBULATORY_CARE_PROVIDER_SITE_OTHER): Payer: PPO | Admitting: *Deleted

## 2018-05-23 DIAGNOSIS — Z7901 Long term (current) use of anticoagulants: Secondary | ICD-10-CM

## 2018-05-23 LAB — POCT INR: INR: 2.5 (ref 2.0–3.0)

## 2018-05-23 NOTE — Patient Instructions (Signed)
Coumadin 6mg  tablets. Take half tablet on Sundays,Tuesdays and thursdays. Take one whole pill Mon,Wed,Friday and Saturday. Recheck INR in 4 weeks.

## 2018-05-28 ENCOUNTER — Ambulatory Visit (INDEPENDENT_AMBULATORY_CARE_PROVIDER_SITE_OTHER): Payer: PPO | Admitting: Family Medicine

## 2018-05-28 ENCOUNTER — Encounter: Payer: Self-pay | Admitting: Family Medicine

## 2018-05-28 VITALS — BP 118/70 | Ht 73.0 in | Wt 233.0 lb

## 2018-05-28 DIAGNOSIS — E1165 Type 2 diabetes mellitus with hyperglycemia: Secondary | ICD-10-CM | POA: Diagnosis not present

## 2018-05-28 DIAGNOSIS — I4891 Unspecified atrial fibrillation: Secondary | ICD-10-CM | POA: Diagnosis not present

## 2018-05-28 DIAGNOSIS — E785 Hyperlipidemia, unspecified: Secondary | ICD-10-CM | POA: Diagnosis not present

## 2018-05-28 DIAGNOSIS — N289 Disorder of kidney and ureter, unspecified: Secondary | ICD-10-CM | POA: Diagnosis not present

## 2018-05-28 MED ORDER — INSULIN GLARGINE 100 UNIT/ML SOLOSTAR PEN: PEN_INJECTOR | SUBCUTANEOUS | 0 refills | Status: DC

## 2018-05-28 NOTE — Patient Instructions (Signed)
Increase long acting insulin to 10 units  Send [place] readings in 2 to 3 weeks  Do labs in early Oct  Office visit in mid Oct

## 2018-05-28 NOTE — Progress Notes (Addendum)
Subjective:    Patient ID: [name] [name], male    DOB: [birth year], 84 y.o.   MRN: [id number]   Diabetes   He presents for his follow-up diabetic visit. He has type 2 diabetes mellitus. Pertinent negatives for hypoglycemia include no confusion, dizziness or headaches. Pertinent negatives for diabetes include no chest pain, no fatigue, no polydipsia, no polyphagia and no weakness. Home blood sugar record trend: pt brought in blood sugar readings.  He does not see a podiatrist. Eye exam is current (april 2019).  last A1C done on bw may 1st. 9.3 INR checked last week. Return in 4 weeks for INR.  Patient on chronic anticoagulation Overall doing well with this Not having any bleeding No sign of any stroke issues Does have significant troubles with venous stasis in the feet but no ulcers or bleeding Having significant trouble sleeping Often takes 2 or 3 hours to get sleep But he states he is finally able to go to sleep and sleeps for 6 or 7 hours Trouble sleeping.  So therefore sleeping problem but been going on for several weeks he has tried melatonin Diabetes is been going on for years but poorly controlled recently but is doing better with his glucose numbers Has a history of atrial fibrillation but not having any complications from it History of chronic anticoagulation but no bleeding issues Patient does have some depression symptoms but is not depressed he lost his wife recently he is working through this is best he can  Review of Systems  Constitutional: Negative for activity change, appetite change and fatigue.  HENT: Negative for congestion and rhinorrhea.   Respiratory: Negative for cough, chest tightness and shortness of breath.   Cardiovascular: Negative for chest pain and leg swelling.  Gastrointestinal: Negative for abdominal pain, diarrhea and nausea.  Endocrine: Negative for polydipsia and polyphagia.  Genitourinary: Negative for dysuria and hematuria.  Neurological:  Negative for dizziness, weakness and headaches.  Psychiatric/Behavioral: Negative for confusion and dysphoric mood.       Objective:   Physical Exam  Constitutional: He appears well-nourished. No distress.  HENT:  Head: Normocephalic and atraumatic.  Eyes: Right eye exhibits no discharge. Left eye exhibits no discharge.  Cardiovascular: Normal rate and normal heart sounds.  No murmur heard. Pulmonary/Chest: Effort normal and breath sounds normal. No respiratory distress.  Musculoskeletal: He exhibits no edema or deformity.  Lymphadenopathy:    He has no cervical adenopathy.  Neurological: He is alert. No cranial nerve deficit.  Skin: Skin is warm and dry.  Psychiatric: His behavior is normal.  Vitals reviewed.  Recent labs reviewed including renal insufficiency and the importance of drinking plenty of liquids and avoiding excessive protein also reviewed over his cholesterol and the importance of keeping that under good control we will need to repeat labs before he follows up       Assessment & Plan:  Renal insufficiency increase fluids in the diet avoid excessive protein Atrial fibrillation stable on current medication Poorly controlled diabetes adjust medications patient to send [place] some glucose readings in a few weeks time we will increase long-acting insulin to 10 units If any blood sugars notify us Watch cholesterol closely and diet continue all medications 25 minutes was spent with the patient.  This statement verifies that 25 minutes was indeed spent with the patient.  More than 50% of this visit-total duration of the visit-was spent in counseling and coordination of care. The issues that the patient came in for today as reflected  in the diagnosis (s) please refer to documentation for further details.  Insomnia is normal.  He needs to do a better job of going to bed at the same time getting up 6 to 7 hours later I do not recommend medications

## 2018-06-08 ENCOUNTER — Other Ambulatory Visit: Payer: Self-pay | Admitting: Family Medicine

## 2018-06-20 ENCOUNTER — Ambulatory Visit (INDEPENDENT_AMBULATORY_CARE_PROVIDER_SITE_OTHER): Payer: PPO | Admitting: *Deleted

## 2018-06-20 DIAGNOSIS — Z7901 Long term (current) use of anticoagulants: Secondary | ICD-10-CM

## 2018-06-20 LAB — POCT INR: INR: 2.3 (ref 2.0–3.0)

## 2018-06-20 NOTE — Patient Instructions (Signed)
Coumadin 6mg  tablets. Take half tablet on Sundays,Tuesdays and thursdays. Take one whole pill Mon,Wed,Friday and Saturday. Recheck INR in 4 weeks.

## 2018-06-25 ENCOUNTER — Other Ambulatory Visit: Payer: Self-pay

## 2018-06-25 MED ORDER — POTASSIUM CHLORIDE CRYS ER 20 MEQ PO TBCR: 20.0000 meq | EXTENDED_RELEASE_TABLET | ORAL | 0 refills | Status: DC

## 2018-07-03 DIAGNOSIS — E119 Type 2 diabetes mellitus without complications: Secondary | ICD-10-CM | POA: Diagnosis not present

## 2018-07-10 ENCOUNTER — Other Ambulatory Visit: Payer: Self-pay

## 2018-07-10 ENCOUNTER — Emergency Department (HOSPITAL_COMMUNITY): Payer: PPO

## 2018-07-10 ENCOUNTER — Telehealth: Payer: Self-pay

## 2018-07-10 ENCOUNTER — Emergency Department (HOSPITAL_COMMUNITY)
Admission: EM | Admit: 2018-07-10 | Discharge: 2018-07-10 | Disposition: A | Discharge status: Home / Self Care | Payer: PPO | Attending: Emergency Medicine | Admitting: Emergency Medicine

## 2018-07-10 ENCOUNTER — Encounter (HOSPITAL_COMMUNITY): Payer: Self-pay | Admitting: Emergency Medicine

## 2018-07-10 DIAGNOSIS — R0602 Shortness of breath: Secondary | ICD-10-CM | POA: Insufficient documentation

## 2018-07-10 DIAGNOSIS — M545 Low back pain: Secondary | ICD-10-CM | POA: Diagnosis not present

## 2018-07-10 DIAGNOSIS — Z7901 Long term (current) use of anticoagulants: Secondary | ICD-10-CM | POA: Insufficient documentation

## 2018-07-10 DIAGNOSIS — Z794 Long term (current) use of insulin: Secondary | ICD-10-CM | POA: Insufficient documentation

## 2018-07-10 DIAGNOSIS — I130 Hypertensive heart and chronic kidney disease with heart failure and stage 1 through stage 4 chronic kidney disease, or unspecified chronic kidney disease: Secondary | ICD-10-CM | POA: Insufficient documentation

## 2018-07-10 DIAGNOSIS — Z79899 Other long term (current) drug therapy: Secondary | ICD-10-CM | POA: Diagnosis not present

## 2018-07-10 DIAGNOSIS — M5442 Lumbago with sciatica, left side: Secondary | ICD-10-CM | POA: Diagnosis not present

## 2018-07-10 DIAGNOSIS — J45909 Unspecified asthma, uncomplicated: Secondary | ICD-10-CM | POA: Insufficient documentation

## 2018-07-10 DIAGNOSIS — Z87891 Personal history of nicotine dependence: Secondary | ICD-10-CM | POA: Insufficient documentation

## 2018-07-10 DIAGNOSIS — I447 Left bundle-branch block, unspecified: Secondary | ICD-10-CM | POA: Diagnosis not present

## 2018-07-10 DIAGNOSIS — I2510 Atherosclerotic heart disease of native coronary artery without angina pectoris: Secondary | ICD-10-CM | POA: Insufficient documentation

## 2018-07-10 DIAGNOSIS — I482 Chronic atrial fibrillation: Secondary | ICD-10-CM | POA: Insufficient documentation

## 2018-07-10 DIAGNOSIS — N182 Chronic kidney disease, stage 2 (mild): Secondary | ICD-10-CM | POA: Insufficient documentation

## 2018-07-10 DIAGNOSIS — E119 Type 2 diabetes mellitus without complications: Secondary | ICD-10-CM | POA: Diagnosis not present

## 2018-07-10 DIAGNOSIS — I5022 Chronic systolic (congestive) heart failure: Secondary | ICD-10-CM | POA: Diagnosis not present

## 2018-07-10 DIAGNOSIS — M5440 Lumbago with sciatica, unspecified side: Secondary | ICD-10-CM

## 2018-07-10 LAB — COMPREHENSIVE METABOLIC PANEL
ALBUMIN: 3.6 g/dL (ref 3.5–5.0)
ALK PHOS: 67 U/L (ref 38–126)
ALT: 30 U/L (ref 0–44)
ANION GAP: 6 (ref 5–15)
AST: 22 U/L (ref 15–41)
BUN: 16 mg/dL (ref 8–23)
CALCIUM: 9.5 mg/dL (ref 8.9–10.3)
CO2: 30 mmol/L (ref 22–32)
Chloride: 99 mmol/L (ref 98–111)
Creatinine, Ser: 1.39 mg/dL — ABNORMAL HIGH (ref 0.61–1.24)
GFR calc Af Amer: 52 mL/min — ABNORMAL LOW (ref >60)
GFR calc non Af Amer: 45 mL/min — ABNORMAL LOW (ref >60)
Glucose, Bld: 297 mg/dL — ABNORMAL HIGH (ref 70–99)
POTASSIUM: 4.1 mmol/L (ref 3.5–5.1)
SODIUM: 135 mmol/L (ref 135–145)
Total Bilirubin: 0.7 mg/dL (ref 0.3–1.2)
Total Protein: 7.5 g/dL (ref 6.5–8.1)

## 2018-07-10 LAB — CBC WITH DIFFERENTIAL/PLATELET
BASOS ABS: 0 10*3/uL (ref 0.0–0.1)
BASOS PCT: 1 %
Eosinophils Absolute: 0.6 10*3/uL (ref 0.0–0.7)
Eosinophils Relative: 8 %
HCT: 45.7 % (ref 39.0–52.0)
HEMOGLOBIN: 15.5 g/dL (ref 13.0–17.0)
Lymphocytes Relative: 15 %
Lymphs Abs: 1 10*3/uL (ref 0.7–4.0)
MCH: 30.5 pg (ref 26.0–34.0)
MCHC: 33.9 g/dL (ref 30.0–36.0)
MCV: 89.8 fL (ref 78.0–100.0)
MONOS PCT: 5 %
Monocytes Absolute: 0.4 10*3/uL (ref 0.1–1.0)
NEUTROS PCT: 71 %
Neutro Abs: 4.7 10*3/uL (ref 1.7–7.7)
Platelets: 179 10*3/uL (ref 150–400)
RBC: 5.09 MIL/uL (ref 4.22–5.81)
RDW: 13.4 % (ref 11.5–15.5)
WBC: 6.6 10*3/uL (ref 4.0–10.5)

## 2018-07-10 LAB — TROPONIN I: Troponin I: <0.03 ng/mL (ref <0.03)

## 2018-07-10 LAB — URINALYSIS, ROUTINE W REFLEX MICROSCOPIC
BACTERIA UA: NONE SEEN
Bilirubin Urine: NEGATIVE
Glucose, UA: >=500 mg/dL — AB
KETONES UR: NEGATIVE mg/dL
LEUKOCYTES UA: NEGATIVE
Nitrite: NEGATIVE
PROTEIN: NEGATIVE mg/dL
Specific Gravity, Urine: 1.014 (ref 1.005–1.030)
pH: 6 (ref 5.0–8.0)

## 2018-07-10 LAB — D-DIMER, QUANTITATIVE (NOT AT ARMC): D DIMER QUANT: 0.31 ug{FEU}/mL (ref 0.00–0.50)

## 2018-07-10 LAB — PROTIME-INR
INR: 1.48
Prothrombin Time: 17.8 seconds — ABNORMAL HIGH (ref 11.4–15.2)

## 2018-07-10 LAB — POC OCCULT BLOOD, ED: Fecal Occult Bld: NEGATIVE

## 2018-07-10 LAB — BRAIN NATRIURETIC PEPTIDE: B NATRIURETIC PEPTIDE 5: 67 pg/mL (ref 0.0–100.0)

## 2018-07-10 MED ORDER — OXYCODONE-ACETAMINOPHEN 5-325 MG PO TABS: 1.0000 | ORAL_TABLET | Freq: Four times a day (QID) | ORAL | 0 refills | Status: DC | PRN

## 2018-07-10 NOTE — Telephone Encounter (Signed)
Patient niece called today stating the pt fell a week or so ago and is now complaining of back pain that is getting worse,he is now urinating on himself. No N/V,no fever ongoing for around two weeks.Discussed with Dr.Scott Luking and advised we have no more open available appointment left for the day. He advised that the pt needed to be evaluated at the [facility]. I spoke with his niece [name] she states understanding and will take him to the [facility] for evaluation.

## 2018-07-10 NOTE — Discharge Instructions (Addendum)
Follow-up with your family doctor next week to check on your back.  Follow up with your cardiologist in the next couple weeks,

## 2018-07-10 NOTE — ED Provider Notes (Signed)
[facility] EMERGENCY DEPARTMENT Provider Note   CSN: [id number] Arrival date & time: 07/10/18  1159     History   Chief Complaint Chief Complaint  Patient presents with  . Back Pain  . Shortness of Breath    HPI [name] [name] is a 84 y.o. male.  Patient states that he fell but kind of caught himself a couple weeks ago and he has been having lower back pain ever since then.  Patient states when the pain gets worse it makes him short of breath.  The history is provided by the patient. No language interpreter was used.  Back Pain   This is a recurrent problem. The current episode started more than 2 days ago. The problem occurs constantly. The problem has not changed since onset.The pain is associated with falling. The pain is present in the lumbar spine. The quality of the pain is described as aching. The pain radiates to the left thigh. The pain is at a severity of 4/10. The pain is moderate. The symptoms are aggravated by twisting. The pain is worse during the day. Pertinent negatives include no chest pain, no headaches and no abdominal pain. He has tried analgesics for the symptoms. The treatment provided no relief. Risk factors: History of back problems.  Shortness of Breath  Pertinent negatives include no headaches, no cough, no chest pain, no abdominal pain and no rash.    Past Medical History:  Diagnosis Date  . Aortic stenosis    a. mod-sev by echo 05/2016.  . Asthma   . Cerebrovascular disease 07/2010   TIA; carotid ultrasound in 07/2010-significant bilateral plaque without focal internal carotid artery stenosis; MRI -encephalomalacia left temporal and right temporal lobes; small inferior right cerebellar infarct; small vessel disease  . Chronic atrial fibrillation (HCC)    Paroxysmal; Echocardiogram in 2007-normal EF; mild LVH; left atrial enlargement; mild stenosis and minimal AI; negative stress nuclear study in 2008  . Chronic systolic CHF (congestive heart failure)  ([place])    a. dx 05/2016 - EF 35-40%, diffuse HK, mod-severe AS, mod gradient, severe AVA VTI likely due to decreased cardiac output in setting of systolic dysfsunction and significant mitral regurgitation, mild MR, mod-severe MR, severe LAE, mild-mod RV dilation, mild RAE, mild-mod TR, mod PASP 79mmHg.  . CKD (chronic kidney disease), stage II   . Degenerative joint disease    feet and legs  . Diabetes mellitus    no insulin; A1c of 6.6 in 2005  . Dizziness    occurs daily,especially in am  . Exertional dyspnea   . Gastroesophageal reflux disease   . Hepatic steatosis   . History of noncompliance with medical treatment   . Hyperlipidemia    adverse reactions to statins and niacin  . Hypertension    Borderline  . Irregular heartbeat   . Mitral regurgitation    a. mod-sev by echo 05/2016  . Peripheral vascular disease ([place])   . Renal insufficiency   . S/P TAVR (transcatheter aortic valve replacement) 07/19/2016   29 mm Edwards Sapien 3 transcatheter heart valve placed via left percutaneous transfemoral approach  . Temporal arteritis ([place])   . Tricuspid regurgitation    a. mild-mod TR by echo 05/2016    Patient Active Problem List   Diagnosis Date Noted  . Renal insufficiency 03/14/2018  . Altered mental status 02/21/2017  . Congestive heart failure (CHF) ([place]) 02/09/2017  . Acute on chronic systolic CHF (congestive heart failure) ([place]) 02/09/2017  . Controlled type 2  diabetes mellitus with hyperglycemia ([place]) 02/09/2017  . Elevated troponin   . S/P TAVR (transcatheter aortic valve replacement) 07/19/2016  . Mitral regurgitation   . VT (ventricular tachycardia) ([place])   . Cardiogenic shock ([place])   . Acute respiratory failure with hypoxia ([place])   . Dyspnea 06/28/2016  . CAD (coronary artery disease), native coronary artery 06/28/2016  . Acute on chronic combined systolic and diastolic CHF, NYHA class 4 ([place])   . Anemia of chronic disease 04/12/2016  . Orthostatic hypotension  08/20/2015  . AKI (acute kidney injury) ([place]) 08/20/2015  . Asthma exacerbation 08/20/2015  . Cognitive dysfunction 05/29/2015  . Chronic back pain 07/29/2014  . Lumbar pain 06/23/2014  . Chronic pain of right ankle 05/30/2014  . Diabetic neuropathy ([place]) 05/30/2014  . Chest congestion 05/02/2013  . Wheezing 05/02/2013  . Unspecified constipation 04/30/2013  . Leg edema, left 04/27/2013  . Carotid stenosis 04/25/2012  . BPH (benign prostatic hypertrophy) with urinary obstruction 09/27/2011  . Dyspnea on exertion 08/01/2011  . Diabetes mellitus ([place]) 07/14/2011  . Hypertension   . Atrial fibrillation ([place])   . Hyperlipidemia   . History of noncompliance with medical treatment   . Aortic stenosis   . Cerebrovascular disease 07/15/2010    Past Surgical History:  Procedure Laterality Date  . COLONOSCOPY  2002  . COLONOSCOPY  01/19/2012   Procedure: COLONOSCOPY;  Surgeon: Rogene Houston, MD;  Location[facility];  Service: Endoscopy;  Laterality: N/A;  1030  . LIPOMA EXCISION  1980  . ORIF ANKLE FRACTURE  2000   Right  . PERIPHERAL VASCULAR CATHETERIZATION N/A 07/11/2016   Procedure: Carotid PTA/Stent Intervention;  Surgeon: Lorretta Harp, MD;  Location: [place] CV LAB;  Service: Cardiovascular;  Laterality: N/A;  . PROSTATE SURGERY  12/2011  . ROTATOR CUFF REPAIR     Right  . TEE WITHOUT CARDIOVERSION N/A 06/17/2016   Procedure: TRANSESOPHAGEAL ECHOCARDIOGRAM (TEE);  Surgeon: Jerline Pain, MD;  Location: [place];  Service: Cardiovascular;  Laterality: N/A;  . TEE WITHOUT CARDIOVERSION N/A 07/19/2016   Procedure: TRANSESOPHAGEAL ECHOCARDIOGRAM (TEE);  Surgeon: Sherren Mocha, MD;  Location: [place];  Service: Open Heart Surgery;  Laterality: N/A;  . TRANSCATHETER AORTIC VALVE REPLACEMENT, TRANSFEMORAL N/A 07/19/2016   Procedure: TRANSCATHETER AORTIC VALVE REPLACEMENT, TRANSFEMORAL;  Surgeon: Sherren Mocha, MD;  Location: [place];  Service: Open Heart Surgery;  Laterality:  N/A;  . TRANSURETHRAL RESECTION OF PROSTATE  09/2011  . URETHRAL STRICTURE DILATATION  1980s        Home Medications    Prior to Admission medications   Medication Sig Start Date End Date Taking? Authorizing Provider  albuterol (PROVENTIL) (2.5 MG/3ML) 0.083% nebulizer solution Take 3 mLs (2.5 mg total) by nebulization every 6 (six) hours as needed for wheezing or shortness of breath. 11/03/17  Yes Mikey Kirschner, MD  bisoprolol (ZEBETA) 5 MG tablet TAKE (1/2) TABLET BY MOUTH ONCE DAILY. 09/26/17  Yes Kathyrn Drown, MD  cholecalciferol (VITAMIN D) 1000 units tablet Take 1,000 Units by mouth daily.   Yes [provider]  furosemide (LASIX) 20 MG tablet Take 20 mg by mouth as directed. On Mondays, Wednesday, and Fridays only   Yes [provider]  hydrOXYzine (ATARAX/VISTARIL) 25 MG tablet Take 1 tablet (25 mg total) by mouth every 6 (six) hours as needed. Stop benadryl 02/09/18  Yes Luking, Elayne Snare, MD  Insulin Glargine (LANTUS SOLOSTAR) 100 UNIT/ML Solostar Pen 10 units every evening. May titrate up to 20  units with doctors direction. 05/28/18  Yes Kathyrn Drown, MD  metFORMIN (GLUCOPHAGE XR) 500 MG 24 hr tablet Take 1 tablet (500 mg total) by mouth daily with breakfast. 03/01/18  Yes Luking, Scott A, MD  nortriptyline (PAMELOR) 10 MG capsule TAKE 1 OR 2 CAPSULES AT BEDTIME FOR BURNING IN FEET.[28 IN PACKS/ NONE IN BOTTLE] 06/08/18  Yes Luking, Scott A, MD  permethrin (ELIMITE) 5 % cream Apply all over except scalp. Apply at bedtime. Wash off in the morning 02/09/18  Yes Luking, Elayne Snare, MD  potassium chloride SA (K-DUR,KLOR-CON) 20 MEQ tablet Take 1 tablet (20 mEq total) by mouth as directed. On Mondays, Wednesdays, and Fridays only 06/25/18  Yes Luking, Elayne Snare, MD  rosuvastatin (CRESTOR) 5 MG tablet TAKE (1) TABLET BY MOUTH ONCE DAILY. 09/26/17  Yes Kathyrn Drown, MD  warfarin (COUMADIN) 6 MG tablet TAKE 1 TABLET AS DIRECTED. Patient taking differently: 6 mg one time  only at 6 PM.  04/20/18  Yes Luking, Elayne Snare, MD  oxyCODONE-acetaminophen (PERCOCET/ROXICET) 5-325 MG tablet Take 1 tablet by mouth every 6 (six) hours as needed. 07/10/18   Milton Ferguson, MD    Family History Family History  Problem Relation Age of Onset  . Stroke Mother   . Diabetes Father   . Colon cancer Neg Hx     Social History Social History   Tobacco Use  . Smoking status: Former Smoker    Packs/day: 1.00    Years: 20.00    Pack years: 20.00    Types: Cigarettes    Start date: 07/04/1950    Last attempt to quit: 04/25/1992    Years since quitting: 26.2  . Smokeless tobacco: Current User    Types: Chew  Substance Use Topics  . Alcohol use: No    Alcohol/week: 0.0 standard drinks  . Drug use: No     Allergies   Ambien [zolpidem tartrate]; Lipitor [atorvastatin calcium]; Ranitidine; Simvastatin; Xanax xr [alprazolam er]; Cholestatin; and Clopidogrel bisulfate   Review of Systems Review of Systems  Constitutional: Negative for appetite change and fatigue.  HENT: Negative for congestion, ear discharge and sinus pressure.   Eyes: Negative for discharge.  Respiratory: Positive for shortness of breath. Negative for cough.   Cardiovascular: Negative for chest pain.  Gastrointestinal: Negative for abdominal pain and diarrhea.  Genitourinary: Negative for frequency and hematuria.  Musculoskeletal: Positive for back pain.  Skin: Negative for rash.  Neurological: Negative for seizures and headaches.  Psychiatric/Behavioral: Negative for hallucinations.     Physical Exam Updated Vital Signs BP (!) 154/67   Pulse (!) 50   Temp 97.7 F (36.5 C) (Rectal)   Resp (!) 21   SpO2 94%   Physical Exam  Constitutional: He is oriented to person, place, and time. He appears well-developed.  HENT:  Head: Normocephalic.  Eyes: Conjunctivae and EOM are normal. No scleral icterus.  Neck: Neck supple. No thyromegaly present.  Cardiovascular: Normal rate. Exam reveals no gallop  and no friction rub.  No murmur heard. Irregular heart rhythm  Pulmonary/Chest: No stridor. He has no wheezes. He has no rales. He exhibits no tenderness.  Abdominal: He exhibits no distension. There is no tenderness. There is no rebound.  Musculoskeletal: Normal range of motion. He exhibits no edema.  Her lumbar spine  Lymphadenopathy:    He has no cervical adenopathy.  Neurological: He is oriented to person, place, and time. He exhibits normal muscle tone. Coordination normal.  Skin: No rash noted. No erythema.  Psychiatric: He has a normal mood and affect. His behavior is normal.     ED Treatments / Results  Labs (all labs ordered are listed, but only abnormal results are displayed) Labs Reviewed  COMPREHENSIVE METABOLIC PANEL - Abnormal; Notable for the following components:      Result Value   Glucose, Bld 297 (*)    Creatinine, Ser 1.39 (*)    GFR calc non Af Amer 45 (*)    GFR calc Af Amer 52 (*)    All other components within normal limits  URINALYSIS, ROUTINE W REFLEX MICROSCOPIC - Abnormal; Notable for the following components:   Glucose, UA >=500 (*)    Hgb urine dipstick MODERATE (*)    All other components within normal limits  PROTIME-INR - Abnormal; Notable for the following components:   Prothrombin Time 17.8 (*)    All other components within normal limits  CBC WITH DIFFERENTIAL/PLATELET  TROPONIN I  BRAIN NATRIURETIC PEPTIDE  D-DIMER, QUANTITATIVE (NOT AT [facility])  POC OCCULT BLOOD, ED    EKG EKG Interpretation  Date/Time:  Tuesday July 10 2018 12:07:39 EDT Ventricular Rate:  65 PR Interval:    QRS Duration: 141 QT Interval:  429 QTC Calculation: 447 R Axis:   -38 Text Interpretation:  Atrial fibrillation Left bundle branch block Baseline wander in lead(s) V6 Confirmed by Milton Ferguson [phone]) on 07/10/2018 2:37:52 PM   Radiology Dg Chest 2 View  Result Date: 07/10/2018 CLINICAL DATA:  Shortness of breath, weakness EXAM: CHEST - 2 VIEW  COMPARISON:  Chest x-ray of 10/23/2017 FINDINGS: No active infiltrate or effusion is seen. Slightly prominent markings at the lung bases appear chronic in nature. Mild cardiomegaly is stable. Mediastinal and hilar contours are unremarkable. An aortic valve replacement is noted. No bony abnormality is seen. IMPRESSION: 1. Aortic valve replacement appears unchanged in position. 2. No active lung disease.  Mild chronic change at the lung bases. Electronically Signed   By: Ivar Drape M.D.   On: 07/10/2018 14:17   Dg Lumbar Spine Complete  Result Date: 07/10/2018 CLINICAL DATA:  Chronic low back pain, slipped getting into the car injured the back recent EXAM: [place] 4+ VIEW COMPARISON:  CT abdomen pelvis of 02/09/2017 FINDINGS: There is little change in alignment of the lumbar vertebrae. No acute compression deformity is seen. Old mild compression of the superior endplate of L2 is noted. Degenerative disc disease is most marked at L4-5 and to a lesser degree at L3-4. Moderate abdominal aortic atherosclerosis is noted. There are degenerative changes involving the facet joints particularly of L4-5 and L5-S1. The SI joints are corticated. IMPRESSION: 1. Old partial compression deformity of superior endplate of L3. No acute abnormality. 2. Degenerative disc disease primarily at L4-5 with degenerative change of the facet joints of L4-5 and L5-S1. Electronically Signed   By: Ivar Drape M.D.   On: 07/10/2018 14:26    Procedures Procedures (including critical care time)  Medications Ordered in ED Medications - No data to display   Initial Impression / Assessment and Plan / ED Course  I have reviewed the triage vital signs and the nursing notes.  Pertinent labs & imaging results that were available during my care of the patient were reviewed by me and considered in my medical decision making (see chart for details).      Labs unremarkable except for elevated glucose.  PT is at 1.5 but he  has not taken his Coumadin today.  Patient improved with pain  medicine x-rays show degenerative changes to his back.  He will be placed on Percocets and will follow-up with his PCP also he will follow-up with cardiologist Final Clinical Impressions(s) / ED Diagnoses   Final diagnoses:  Acute left-sided low back pain with sciatica, sciatica laterality unspecified    ED Discharge Orders         Ordered    oxyCODONE-acetaminophen (PERCOCET/ROXICET) 5-325 MG tablet  Every 6 hours PRN     07/10/18 1510           Milton Ferguson, MD 07/10/18 1517

## 2018-07-10 NOTE — ED Triage Notes (Signed)
Pt c/o chronic lower back pain. Sob x 1 day. C/o unable to hold urine x 1 week. A/o to most. abd distended and mildy firm. No ble noted. No obvious sob noted

## 2018-07-10 NOTE — Telephone Encounter (Signed)
I agree with this  

## 2018-07-18 ENCOUNTER — Ambulatory Visit (INDEPENDENT_AMBULATORY_CARE_PROVIDER_SITE_OTHER): Payer: PPO | Admitting: *Deleted

## 2018-07-18 DIAGNOSIS — Z7901 Long term (current) use of anticoagulants: Secondary | ICD-10-CM

## 2018-07-18 LAB — POCT INR: INR: 2.2 (ref 2.0–3.0)

## 2018-07-18 NOTE — Patient Instructions (Signed)
Coumadin 6mg  tablets. Take half tablet on Sundays,Tuesdays and thursdays. Take one whole pill Mon,Wed,Friday and Saturday. Recheck INR in 4 weeks

## 2018-07-19 DIAGNOSIS — M4856XS Collapsed vertebra, not elsewhere classified, lumbar region, sequela of fracture: Secondary | ICD-10-CM | POA: Diagnosis not present

## 2018-07-19 DIAGNOSIS — M545 Low back pain: Secondary | ICD-10-CM | POA: Diagnosis not present

## 2018-07-19 DIAGNOSIS — M5136 Other intervertebral disc degeneration, lumbar region: Secondary | ICD-10-CM | POA: Diagnosis not present

## 2018-07-25 DIAGNOSIS — H16142 Punctate keratitis, left eye: Secondary | ICD-10-CM | POA: Diagnosis not present

## 2018-07-25 DIAGNOSIS — H5712 Ocular pain, left eye: Secondary | ICD-10-CM | POA: Diagnosis not present

## 2018-07-25 DIAGNOSIS — H532 Diplopia: Secondary | ICD-10-CM | POA: Diagnosis not present

## 2018-07-30 DIAGNOSIS — E119 Type 2 diabetes mellitus without complications: Secondary | ICD-10-CM | POA: Diagnosis not present

## 2018-07-31 ENCOUNTER — Other Ambulatory Visit: Payer: Self-pay | Admitting: Family Medicine

## 2018-08-15 ENCOUNTER — Encounter: Payer: Self-pay | Admitting: Family Medicine

## 2018-08-15 ENCOUNTER — Ambulatory Visit (INDEPENDENT_AMBULATORY_CARE_PROVIDER_SITE_OTHER): Payer: PPO | Admitting: Family Medicine

## 2018-08-15 ENCOUNTER — Other Ambulatory Visit: Payer: Self-pay | Admitting: Family Medicine

## 2018-08-15 VITALS — BP 142/86 | Ht 73.0 in | Wt 235.0 lb

## 2018-08-15 DIAGNOSIS — Z7901 Long term (current) use of anticoagulants: Secondary | ICD-10-CM | POA: Diagnosis not present

## 2018-08-15 DIAGNOSIS — Z79899 Other long term (current) drug therapy: Secondary | ICD-10-CM | POA: Diagnosis not present

## 2018-08-15 DIAGNOSIS — N289 Disorder of kidney and ureter, unspecified: Secondary | ICD-10-CM | POA: Diagnosis not present

## 2018-08-15 DIAGNOSIS — I4811 Longstanding persistent atrial fibrillation: Secondary | ICD-10-CM

## 2018-08-15 DIAGNOSIS — E785 Hyperlipidemia, unspecified: Secondary | ICD-10-CM

## 2018-08-15 DIAGNOSIS — I10 Essential (primary) hypertension: Secondary | ICD-10-CM | POA: Diagnosis not present

## 2018-08-15 DIAGNOSIS — E1165 Type 2 diabetes mellitus with hyperglycemia: Secondary | ICD-10-CM | POA: Diagnosis not present

## 2018-08-15 DIAGNOSIS — D692 Other nonthrombocytopenic purpura: Secondary | ICD-10-CM

## 2018-08-15 DIAGNOSIS — R270 Ataxia, unspecified: Secondary | ICD-10-CM

## 2018-08-15 DIAGNOSIS — Z23 Encounter for immunization: Secondary | ICD-10-CM | POA: Diagnosis not present

## 2018-08-15 HISTORY — DX: Other nonthrombocytopenic purpura: D69.2

## 2018-08-15 LAB — POCT INR: INR: 1.6 — AB (ref 2.0–3.0)

## 2018-08-15 MED ORDER — INSULIN GLARGINE 100 UNIT/ML SOLOSTAR PEN: PEN_INJECTOR | SUBCUTANEOUS | 0 refills | Status: DC

## 2018-08-15 NOTE — Patient Instructions (Signed)
Take 1/2 tablet on Sunday and Thursday. Take one tablet on other days

## 2018-08-15 NOTE — Progress Notes (Signed)
Subjective:    Patient ID: [name] [name], male    DOB: 1932/06/26, 84 y.o.   MRN: [id number]  HPI Pt here today for 3 month follow up and INR.  Results for orders placed or performed in visit on 08/15/18  POCT INR  Result Value Ref Range   INR 1.6 (A) 2.0 - 3.0  Patient is on Coumadin he denies any bleeding issues takes his medicine on a regular basis but the INR is off today  Has persistent atrial fibrillation currently on anticoagulant.  Has not had any TIAs recently no strokes.  Denies any chest tightness pressure pain or shortness of breath  He does relate a lot of weakness difficulty walking uses a walker has ataxia has not fallen but is at risk of falling  Patient does have senile purpura on his arms but denies any rectal bleeding or hematuria issues.  Patient has hyperlipidemia watch his diet on a regular basis as best he can is dependent on others for his food Patient here for follow-up regarding cholesterol.  The patient does have hyperlipidemia.  Patient does try to maintain a reasonable diet.  Patient does take the medication on a regular basis.  Denies missing a dose.  The patient denies any obvious side effects.  Prior blood work results reviewed with the patient.  The patient is aware of his cholesterol goals and the need to keep it under good control to lessen the risk of disease.  The patient was seen today as part of a comprehensive diabetic check up.the patient does have diabetes.  The patient follows here on a regular basis.  The patient relates medication compliance. No significant side effects to the medications. Denies any low glucose spells. Relates compliance with diet to a reasonable level. Patient does do labwork intermittently and understands the dangers of diabetes.    Renal insufficiency stable watch his diet drinks plenty of liquids  Review of Systems  Constitutional: Negative for diaphoresis and fatigue.  HENT: Negative for congestion and rhinorrhea.     Respiratory: Negative for cough and shortness of breath.   Cardiovascular: Negative for chest pain and leg swelling.  Gastrointestinal: Negative for abdominal pain and diarrhea.  Genitourinary: Positive for frequency and urgency.  Skin: Negative for color change and rash.  Neurological: Positive for tremors and weakness. Negative for dizziness and headaches.  Psychiatric/Behavioral: Negative for behavioral problems and confusion.       Objective:   Physical Exam  Constitutional: He appears well-nourished. No distress.  HENT:  Head: Normocephalic and atraumatic.  Eyes: Right eye exhibits no discharge. Left eye exhibits no discharge.  Neck: No tracheal deviation present.  Cardiovascular: Normal rate, regular rhythm and normal heart sounds.  No murmur heard. Pulmonary/Chest: Effort normal and breath sounds normal. No respiratory distress.  Musculoskeletal: He exhibits no edema.  Lymphadenopathy:    He has no cervical adenopathy.  Neurological: He is alert. Coordination normal.  Skin: Skin is warm and dry.  Psychiatric: He has a normal mood and affect. His behavior is normal.  Vitals reviewed.         Assessment & Plan:  Ataxia weakness Face-to-face exam completed Home health evaluation for physical therapy Patient is homebound and would require home physical therapy  Patient to use walker always  Patient on anticoagulant because of atrial fib no bleeding issues tolerating Coumadin well adjusted today recheck in 4 weeks  Blood pressure good control continue current measures watch diet Lab work ordered today History of CHF  no active failure going on currently within good compensation currently  Diabetes with renal insufficiency check lab work await the results.  Minimize starches in the diet.  Increase insulin 12 units.  Senile purpura- stable.  Will check CBC periodically  Venous insufficiency in the feet propped up feet when possible keep feet properly covered and  warm  25 minutes was spent with the patient.  This statement verifies that 25 minutes was indeed spent with the patient.  More than 50% of this visit-total duration of the visit-was spent in counseling and coordination of care. The issues that the patient came in for today as reflected in the diagnosis (s) please refer to documentation for further details.   Monthly INR Follow-up 3 months

## 2018-08-16 LAB — BASIC METABOLIC PANEL
BUN/Creatinine Ratio: 18 (ref 10–24)
BUN: 27 mg/dL (ref 8–27)
CO2: 22 mmol/L (ref 20–29)
Calcium: 9.9 mg/dL (ref 8.6–10.2)
Chloride: 97 mmol/L (ref 96–106)
Creatinine, Ser: 1.47 mg/dL — ABNORMAL HIGH (ref 0.76–1.27)
GFR calc Af Amer: 50 mL/min/{1.73_m2} — ABNORMAL LOW (ref >59)
GFR calc non Af Amer: 43 mL/min/{1.73_m2} — ABNORMAL LOW (ref >59)
GLUCOSE: 298 mg/dL — AB (ref 65–99)
Potassium: 5.1 mmol/L (ref 3.5–5.2)
SODIUM: 138 mmol/L (ref 134–144)

## 2018-08-16 LAB — HEPATIC FUNCTION PANEL
ALK PHOS: 87 IU/L (ref 39–117)
ALT: 55 IU/L — ABNORMAL HIGH (ref 0–44)
AST: 21 IU/L (ref 0–40)
Albumin: 4.4 g/dL (ref 3.5–4.7)
Bilirubin Total: 0.3 mg/dL (ref 0.0–1.2)
Bilirubin, Direct: 0.11 mg/dL (ref 0.00–0.40)
TOTAL PROTEIN: 7.2 g/dL (ref 6.0–8.5)

## 2018-08-16 LAB — HEMOGLOBIN A1C
ESTIMATED AVERAGE GLUCOSE: 232 mg/dL
Hgb A1c MFr Bld: 9.7 % — ABNORMAL HIGH (ref 4.8–5.6)

## 2018-08-16 LAB — LIPID PANEL
Chol/HDL Ratio: 4 ratio (ref 0.0–5.0)
Cholesterol, Total: 178 mg/dL (ref 100–199)
HDL: 45 mg/dL (ref >39)
LDL CALC: 75 mg/dL (ref 0–99)
Triglycerides: 288 mg/dL — ABNORMAL HIGH (ref 0–149)
VLDL CHOLESTEROL CAL: 58 mg/dL — AB (ref 5–40)

## 2018-08-25 DIAGNOSIS — G8929 Other chronic pain: Secondary | ICD-10-CM | POA: Diagnosis not present

## 2018-08-25 DIAGNOSIS — Z9181 History of falling: Secondary | ICD-10-CM | POA: Diagnosis not present

## 2018-08-25 DIAGNOSIS — E1151 Type 2 diabetes mellitus with diabetic peripheral angiopathy without gangrene: Secondary | ICD-10-CM | POA: Diagnosis not present

## 2018-08-25 DIAGNOSIS — I872 Venous insufficiency (chronic) (peripheral): Secondary | ICD-10-CM | POA: Diagnosis not present

## 2018-08-25 DIAGNOSIS — D692 Other nonthrombocytopenic purpura: Secondary | ICD-10-CM | POA: Diagnosis not present

## 2018-08-25 DIAGNOSIS — Z794 Long term (current) use of insulin: Secondary | ICD-10-CM | POA: Diagnosis not present

## 2018-08-25 DIAGNOSIS — R270 Ataxia, unspecified: Secondary | ICD-10-CM | POA: Diagnosis not present

## 2018-08-25 DIAGNOSIS — M545 Low back pain: Secondary | ICD-10-CM | POA: Diagnosis not present

## 2018-08-25 DIAGNOSIS — I4891 Unspecified atrial fibrillation: Secondary | ICD-10-CM | POA: Diagnosis not present

## 2018-08-25 DIAGNOSIS — Z7901 Long term (current) use of anticoagulants: Secondary | ICD-10-CM | POA: Diagnosis not present

## 2018-08-25 DIAGNOSIS — E1121 Type 2 diabetes mellitus with diabetic nephropathy: Secondary | ICD-10-CM | POA: Diagnosis not present

## 2018-08-25 DIAGNOSIS — E785 Hyperlipidemia, unspecified: Secondary | ICD-10-CM | POA: Diagnosis not present

## 2018-08-27 ENCOUNTER — Other Ambulatory Visit: Payer: Self-pay | Admitting: Family Medicine

## 2018-08-27 ENCOUNTER — Telehealth: Payer: Self-pay | Admitting: Family Medicine

## 2018-08-27 NOTE — Telephone Encounter (Signed)
Verbal order: 1 week one, two week 8  advise.

## 2018-08-27 NOTE — Telephone Encounter (Signed)
Joey notified on voicemail

## 2018-08-27 NOTE — Telephone Encounter (Signed)
May have verbal order °

## 2018-08-31 DIAGNOSIS — E119 Type 2 diabetes mellitus without complications: Secondary | ICD-10-CM | POA: Diagnosis not present

## 2018-09-12 ENCOUNTER — Ambulatory Visit (INDEPENDENT_AMBULATORY_CARE_PROVIDER_SITE_OTHER): Payer: PPO | Admitting: Family Medicine

## 2018-09-12 ENCOUNTER — Encounter: Payer: Self-pay | Admitting: Family Medicine

## 2018-09-12 VITALS — BP 124/78 | Ht 73.0 in | Wt 238.2 lb

## 2018-09-12 DIAGNOSIS — I4811 Longstanding persistent atrial fibrillation: Secondary | ICD-10-CM | POA: Diagnosis not present

## 2018-09-12 DIAGNOSIS — E0842 Diabetes mellitus due to underlying condition with diabetic polyneuropathy: Secondary | ICD-10-CM

## 2018-09-12 DIAGNOSIS — I10 Essential (primary) hypertension: Secondary | ICD-10-CM | POA: Diagnosis not present

## 2018-09-12 DIAGNOSIS — Z7901 Long term (current) use of anticoagulants: Secondary | ICD-10-CM | POA: Diagnosis not present

## 2018-09-12 DIAGNOSIS — E785 Hyperlipidemia, unspecified: Secondary | ICD-10-CM

## 2018-09-12 DIAGNOSIS — D692 Other nonthrombocytopenic purpura: Secondary | ICD-10-CM

## 2018-09-12 DIAGNOSIS — E1142 Type 2 diabetes mellitus with diabetic polyneuropathy: Secondary | ICD-10-CM | POA: Diagnosis not present

## 2018-09-12 LAB — POCT INR
INR: 1.4 — AB (ref 2.0–3.0)
INR: 1.4 — AB (ref 2.0–3.0)

## 2018-09-12 MED ORDER — INSULIN GLARGINE 100 UNIT/ML SOLOSTAR PEN: PEN_INJECTOR | SUBCUTANEOUS | 11 refills | Status: DC

## 2018-09-12 NOTE — Progress Notes (Signed)
Subjective:    Patient ID: [name] [name], male    DOB: 02-12-1932, 84 y.o.   MRN: [id number]  HPI Pt here today for 4 week recheck.  Results for orders placed or performed in visit on 09/12/18  POCT INR  Result Value Ref Range   INR 1.4 (A) 2.0 - 3.0  Patient overall is sharp mentally He still is active his best he can be He does suffer with atrial fibrillation and deconditioning Also has diabetes glucose readings are above what we would like it to be we have talked at length about the importance of getting it under better control  Patient here for follow-up regarding cholesterol.  The patient does have hyperlipidemia.  Patient does try to maintain a reasonable diet.  Patient does take the medication on a regular basis.  Denies missing a dose.  The patient denies any obvious side effects.  Prior blood work results reviewed with the patient.  The patient is aware of his cholesterol goals and the need to keep it under good control to lessen the risk of disease.  Patient for blood pressure check up.  The patient does have hypertension.  The patient is on medication.  Patient relates compliance with meds. Todays BP reviewed with the patient. Patient denies issues with medication. Patient relates reasonable diet. Patient tries to minimize salt. Patient aware of BP goals.  He is on Coumadin and we need to adjust the dose today he denies any bleeding issues Review of Systems  Constitutional: Negative for diaphoresis and fatigue.  HENT: Negative for congestion and rhinorrhea.   Respiratory: Negative for cough and shortness of breath.   Cardiovascular: Negative for chest pain and leg swelling.  Gastrointestinal: Negative for abdominal pain and diarrhea.  Skin: Negative for color change and rash.  Neurological: Negative for dizziness and headaches.  Psychiatric/Behavioral: Negative for behavioral problems and confusion.       Objective:   Physical Exam  Constitutional: He appears  well-nourished. No distress.  HENT:  Head: Normocephalic and atraumatic.  Eyes: Right eye exhibits no discharge. Left eye exhibits no discharge.  Neck: No tracheal deviation present.  Cardiovascular: Normal rate, regular rhythm and normal heart sounds.  No murmur heard. Pulmonary/Chest: Effort normal and breath sounds normal. No respiratory distress.  Musculoskeletal: He exhibits no edema.  Lymphadenopathy:    He has no cervical adenopathy.  Neurological: He is alert. Coordination normal.  Skin: Skin is warm and dry.  Psychiatric: He has a normal mood and affect. His behavior is normal.  Vitals reviewed. Senile purpura noted on the arms        Assessment & Plan:  Long-term use anticoagulants tolerating well adjust INR  HTN- Patient was seen today as part of a visit regarding hypertension. The importance of healthy diet and regular physical activity was discussed. The importance of compliance with medications discussed.  Ideal goal is to keep blood pressure low elevated levels certainly below 086/76 when possible.  The patient was counseled that keeping blood pressure under control lessen his risk of complications.  The importance of regular follow-ups was discussed with the patient.  Low-salt diet such as DASH recommended.  Regular physical activity was recommended as well.  Patient was advised to keep regular follow-ups.  Senile purpura on his arms stable  Type 2 diabetes with neuropathy diabetic shoes recommended  Hyperlipidemia takes medicine lab work looks good continue current measures  Atrial fib rate is controlled  25 minutes was spent with the patient.  This statement verifies that 25 minutes was indeed spent with the patient.  More than 50% of this visit-total duration of the visit-was spent in counseling and coordination of care. The issues that the patient came in for today as reflected in the diagnosis (s) please refer to documentation for further  details.  Diabetes not at goal adjust upward on insulin 14 units

## 2018-09-12 NOTE — Patient Instructions (Signed)
Take one half tablet on Sundays; take one tablet on other days. Recheck in 4 weeks.

## 2018-09-14 DIAGNOSIS — M545 Low back pain: Secondary | ICD-10-CM | POA: Diagnosis not present

## 2018-09-14 DIAGNOSIS — R270 Ataxia, unspecified: Secondary | ICD-10-CM | POA: Diagnosis not present

## 2018-09-14 DIAGNOSIS — E1151 Type 2 diabetes mellitus with diabetic peripheral angiopathy without gangrene: Secondary | ICD-10-CM | POA: Diagnosis not present

## 2018-09-14 DIAGNOSIS — G8929 Other chronic pain: Secondary | ICD-10-CM | POA: Diagnosis not present

## 2018-09-19 ENCOUNTER — Other Ambulatory Visit: Payer: Self-pay | Admitting: Family Medicine

## 2018-09-24 ENCOUNTER — Other Ambulatory Visit: Payer: Self-pay | Admitting: Family Medicine

## 2018-09-24 DIAGNOSIS — I872 Venous insufficiency (chronic) (peripheral): Secondary | ICD-10-CM | POA: Diagnosis not present

## 2018-09-24 DIAGNOSIS — E1121 Type 2 diabetes mellitus with diabetic nephropathy: Secondary | ICD-10-CM | POA: Diagnosis not present

## 2018-09-24 DIAGNOSIS — I4891 Unspecified atrial fibrillation: Secondary | ICD-10-CM | POA: Diagnosis not present

## 2018-09-24 DIAGNOSIS — Z9181 History of falling: Secondary | ICD-10-CM | POA: Diagnosis not present

## 2018-09-24 DIAGNOSIS — Z7901 Long term (current) use of anticoagulants: Secondary | ICD-10-CM | POA: Diagnosis not present

## 2018-09-24 DIAGNOSIS — E785 Hyperlipidemia, unspecified: Secondary | ICD-10-CM | POA: Diagnosis not present

## 2018-09-24 DIAGNOSIS — R270 Ataxia, unspecified: Secondary | ICD-10-CM | POA: Diagnosis not present

## 2018-09-24 DIAGNOSIS — Z794 Long term (current) use of insulin: Secondary | ICD-10-CM | POA: Diagnosis not present

## 2018-09-24 DIAGNOSIS — E1151 Type 2 diabetes mellitus with diabetic peripheral angiopathy without gangrene: Secondary | ICD-10-CM | POA: Diagnosis not present

## 2018-09-24 DIAGNOSIS — M545 Low back pain: Secondary | ICD-10-CM | POA: Diagnosis not present

## 2018-09-24 DIAGNOSIS — G8929 Other chronic pain: Secondary | ICD-10-CM | POA: Diagnosis not present

## 2018-09-24 DIAGNOSIS — D692 Other nonthrombocytopenic purpura: Secondary | ICD-10-CM | POA: Diagnosis not present

## 2018-10-01 DIAGNOSIS — I872 Venous insufficiency (chronic) (peripheral): Secondary | ICD-10-CM | POA: Diagnosis not present

## 2018-10-01 DIAGNOSIS — G8929 Other chronic pain: Secondary | ICD-10-CM | POA: Diagnosis not present

## 2018-10-01 DIAGNOSIS — I4891 Unspecified atrial fibrillation: Secondary | ICD-10-CM | POA: Diagnosis not present

## 2018-10-01 DIAGNOSIS — Z7901 Long term (current) use of anticoagulants: Secondary | ICD-10-CM | POA: Diagnosis not present

## 2018-10-01 DIAGNOSIS — E1151 Type 2 diabetes mellitus with diabetic peripheral angiopathy without gangrene: Secondary | ICD-10-CM | POA: Diagnosis not present

## 2018-10-01 DIAGNOSIS — E1121 Type 2 diabetes mellitus with diabetic nephropathy: Secondary | ICD-10-CM | POA: Diagnosis not present

## 2018-10-01 DIAGNOSIS — E785 Hyperlipidemia, unspecified: Secondary | ICD-10-CM | POA: Diagnosis not present

## 2018-10-01 DIAGNOSIS — R270 Ataxia, unspecified: Secondary | ICD-10-CM | POA: Diagnosis not present

## 2018-10-01 DIAGNOSIS — D692 Other nonthrombocytopenic purpura: Secondary | ICD-10-CM | POA: Diagnosis not present

## 2018-10-01 DIAGNOSIS — M545 Low back pain: Secondary | ICD-10-CM | POA: Diagnosis not present

## 2018-10-01 DIAGNOSIS — Z794 Long term (current) use of insulin: Secondary | ICD-10-CM | POA: Diagnosis not present

## 2018-10-01 DIAGNOSIS — Z9181 History of falling: Secondary | ICD-10-CM | POA: Diagnosis not present

## 2018-10-09 ENCOUNTER — Ambulatory Visit (INDEPENDENT_AMBULATORY_CARE_PROVIDER_SITE_OTHER): Payer: PPO | Admitting: *Deleted

## 2018-10-09 DIAGNOSIS — I872 Venous insufficiency (chronic) (peripheral): Secondary | ICD-10-CM | POA: Diagnosis not present

## 2018-10-09 DIAGNOSIS — E785 Hyperlipidemia, unspecified: Secondary | ICD-10-CM | POA: Diagnosis not present

## 2018-10-09 DIAGNOSIS — R270 Ataxia, unspecified: Secondary | ICD-10-CM | POA: Diagnosis not present

## 2018-10-09 DIAGNOSIS — E1121 Type 2 diabetes mellitus with diabetic nephropathy: Secondary | ICD-10-CM | POA: Diagnosis not present

## 2018-10-09 DIAGNOSIS — Z794 Long term (current) use of insulin: Secondary | ICD-10-CM | POA: Diagnosis not present

## 2018-10-09 DIAGNOSIS — E1151 Type 2 diabetes mellitus with diabetic peripheral angiopathy without gangrene: Secondary | ICD-10-CM | POA: Diagnosis not present

## 2018-10-09 DIAGNOSIS — D692 Other nonthrombocytopenic purpura: Secondary | ICD-10-CM | POA: Diagnosis not present

## 2018-10-09 DIAGNOSIS — G8929 Other chronic pain: Secondary | ICD-10-CM | POA: Diagnosis not present

## 2018-10-09 DIAGNOSIS — I4891 Unspecified atrial fibrillation: Secondary | ICD-10-CM | POA: Diagnosis not present

## 2018-10-09 DIAGNOSIS — Z7901 Long term (current) use of anticoagulants: Secondary | ICD-10-CM

## 2018-10-09 DIAGNOSIS — Z9181 History of falling: Secondary | ICD-10-CM | POA: Diagnosis not present

## 2018-10-09 DIAGNOSIS — M545 Low back pain: Secondary | ICD-10-CM | POA: Diagnosis not present

## 2018-10-09 LAB — POCT INR: INR: 2.6 (ref 2.0–3.0)

## 2018-10-09 NOTE — Patient Instructions (Signed)
Coumadin 6mg  tablets. Take 1/2 tablet on sundays and  Take one whole tablet rest of days

## 2018-10-15 DIAGNOSIS — M545 Low back pain: Secondary | ICD-10-CM | POA: Diagnosis not present

## 2018-10-15 DIAGNOSIS — E1121 Type 2 diabetes mellitus with diabetic nephropathy: Secondary | ICD-10-CM | POA: Diagnosis not present

## 2018-10-15 DIAGNOSIS — I4891 Unspecified atrial fibrillation: Secondary | ICD-10-CM | POA: Diagnosis not present

## 2018-10-15 DIAGNOSIS — I872 Venous insufficiency (chronic) (peripheral): Secondary | ICD-10-CM | POA: Diagnosis not present

## 2018-10-15 DIAGNOSIS — D692 Other nonthrombocytopenic purpura: Secondary | ICD-10-CM | POA: Diagnosis not present

## 2018-10-15 DIAGNOSIS — Z9181 History of falling: Secondary | ICD-10-CM | POA: Diagnosis not present

## 2018-10-15 DIAGNOSIS — E1151 Type 2 diabetes mellitus with diabetic peripheral angiopathy without gangrene: Secondary | ICD-10-CM | POA: Diagnosis not present

## 2018-10-15 DIAGNOSIS — R270 Ataxia, unspecified: Secondary | ICD-10-CM | POA: Diagnosis not present

## 2018-10-15 DIAGNOSIS — Z794 Long term (current) use of insulin: Secondary | ICD-10-CM | POA: Diagnosis not present

## 2018-10-15 DIAGNOSIS — G8929 Other chronic pain: Secondary | ICD-10-CM | POA: Diagnosis not present

## 2018-10-15 DIAGNOSIS — E785 Hyperlipidemia, unspecified: Secondary | ICD-10-CM | POA: Diagnosis not present

## 2018-10-15 DIAGNOSIS — Z7901 Long term (current) use of anticoagulants: Secondary | ICD-10-CM | POA: Diagnosis not present

## 2018-10-15 DIAGNOSIS — E119 Type 2 diabetes mellitus without complications: Secondary | ICD-10-CM | POA: Diagnosis not present

## 2018-11-05 ENCOUNTER — Ambulatory Visit (INDEPENDENT_AMBULATORY_CARE_PROVIDER_SITE_OTHER): Payer: PPO | Admitting: Family Medicine

## 2018-11-05 ENCOUNTER — Encounter: Payer: Self-pay | Admitting: Family Medicine

## 2018-11-05 VITALS — Temp 97.6°F | Wt 243.6 lb

## 2018-11-05 DIAGNOSIS — B9789 Other viral agents as the cause of diseases classified elsewhere: Secondary | ICD-10-CM

## 2018-11-05 DIAGNOSIS — Z7901 Long term (current) use of anticoagulants: Secondary | ICD-10-CM | POA: Diagnosis not present

## 2018-11-05 DIAGNOSIS — J069 Acute upper respiratory infection, unspecified: Secondary | ICD-10-CM

## 2018-11-05 LAB — POCT INR: INR: 3.7 — AB (ref 2.0–3.0)

## 2018-11-05 NOTE — Patient Instructions (Signed)
No coumadin today 11/05/18. Take one tablet on Monday, Wednesday, Thursday and Saturday. Then take one half tablet on Tuesday, Friday and Sunday. Recheck in 1 week

## 2018-11-05 NOTE — Progress Notes (Signed)
   Subjective:    Patient ID: [name] [name], male    DOB: 1932-03-11, 84 y.o.   MRN: [id number]  Wheezing   This is a new problem. The current episode started in the past 7 days. Associated symptoms include coughing and rhinorrhea. Pertinent negatives include no chest pain, chills, ear pain, fever or vomiting. Associated symptoms comments: Hoarse, gained about 5 pounds(pt states he weighted 235 at home this morning.).   Pt nephew would like to know if INR can be done today.  Congestion coughing drainage not feeling good denies high fever chills sweats Patient denies any bleeding problems bruising problems  Review of Systems  Constitutional: Negative for activity change, chills and fever.  HENT: Positive for congestion and rhinorrhea. Negative for ear pain.   Eyes: Negative for discharge.  Respiratory: Positive for cough and wheezing.   Cardiovascular: Negative for chest pain.  Gastrointestinal: Negative for nausea and vomiting.  Musculoskeletal: Negative for arthralgias.       Objective:   Physical Exam Vitals signs and nursing note reviewed.  Constitutional:      Appearance: He is well-developed.  HENT:     Head: Normocephalic.     Mouth/Throat:     Pharynx: No oropharyngeal exudate.  Neck:     Musculoskeletal: Normal range of motion.  Cardiovascular:     Rate and Rhythm: Normal rate and regular rhythm.     Heart sounds: Normal heart sounds. No murmur.  Pulmonary:     Effort: Pulmonary effort is normal.     Breath sounds: Normal breath sounds. No wheezing.  Lymphadenopathy:     Cervical: No cervical adenopathy.  Skin:    General: Skin is warm and dry.  Neurological:     Motor: No abnormal muscle tone.     No respiratory distress no sign of pneumonia on today's exam I do not recommend an x-ray      Assessment & Plan:  INR is above the target zone hold medication today reduce dose follow-up INR again in 1 week's time  Viral syndrome no sign of bacterial component  I do not recommend antibiotic currently warning signs were discussed follow-up if progressive troubles

## 2018-11-09 ENCOUNTER — Ambulatory Visit: Payer: PPO

## 2018-11-15 ENCOUNTER — Ambulatory Visit (INDEPENDENT_AMBULATORY_CARE_PROVIDER_SITE_OTHER): Payer: PPO

## 2018-11-15 DIAGNOSIS — Z7901 Long term (current) use of anticoagulants: Secondary | ICD-10-CM

## 2018-11-15 LAB — POCT INR: INR: 2.1 (ref 2.0–3.0)

## 2018-11-15 NOTE — Patient Instructions (Signed)
Take 1/2 on Sunday and Tuesday all other days take one tablet.Recheck three weeks per Dr.Steve.

## 2018-11-30 ENCOUNTER — Other Ambulatory Visit: Payer: Self-pay | Admitting: Family Medicine

## 2018-11-30 DIAGNOSIS — E119 Type 2 diabetes mellitus without complications: Secondary | ICD-10-CM | POA: Diagnosis not present

## 2018-12-06 ENCOUNTER — Ambulatory Visit (INDEPENDENT_AMBULATORY_CARE_PROVIDER_SITE_OTHER): Payer: PPO | Admitting: Family Medicine

## 2018-12-06 VITALS — BP 122/74 | Wt 246.8 lb

## 2018-12-06 DIAGNOSIS — E1142 Type 2 diabetes mellitus with diabetic polyneuropathy: Secondary | ICD-10-CM

## 2018-12-06 DIAGNOSIS — I10 Essential (primary) hypertension: Secondary | ICD-10-CM | POA: Diagnosis not present

## 2018-12-06 DIAGNOSIS — E1165 Type 2 diabetes mellitus with hyperglycemia: Secondary | ICD-10-CM

## 2018-12-06 DIAGNOSIS — Z7901 Long term (current) use of anticoagulants: Secondary | ICD-10-CM

## 2018-12-06 DIAGNOSIS — R5383 Other fatigue: Secondary | ICD-10-CM

## 2018-12-06 LAB — POCT GLYCOSYLATED HEMOGLOBIN (HGB A1C): HEMOGLOBIN A1C: 7.9 % — AB (ref 4.0–5.6)

## 2018-12-06 LAB — POCT INR: INR: 2.6 (ref 2.0–3.0)

## 2018-12-06 NOTE — Patient Instructions (Addendum)
Continue same treatment. Check INR monthly. Follow up with Dr.Scott in 3 months

## 2018-12-06 NOTE — Progress Notes (Signed)
Subjective:    Patient ID: [name] [name], male    DOB: 1932-01-23, 85 y.o.   MRN: [id number]  Diabetes  He presents for his follow-up diabetic visit. He has type 2 diabetes mellitus. There are no hypoglycemic associated symptoms. Pertinent negatives for hypoglycemia include no headaches. There are no diabetic associated symptoms. Pertinent negatives for diabetes include no chest pain, no fatigue and no weakness. There are no hypoglycemic complications. There are no diabetic complications.   Pt also here for INR.  Patient denies any bleeding issues  He does have a history of atrial fib under good control with his medication he does have a history of fluid retention as well but no recent swelling  He relates he does get dizzy and somewhat lightheaded when he lays down and takes a few minutes for it to go away he also states when he sits on the side of the bed he gets a little lightheaded as well he denies any chest tightness pressure pain or shortness of breath  He states he is trying to eat fairly well but he battles with fatigue and tiredness more than likely the fatigue and tiredness is a byproduct of his chronic health issues and inability to be active recent lab work over the past 4 months was reviewed  Results for orders placed or performed in visit on 12/06/18  POCT HgB A1C  Result Value Ref Range   Hemoglobin A1C 7.9 (A) 4.0 - 5.6 %   HbA1c POC (<> result, manual entry)     HbA1c, POC (prediabetic range)     HbA1c, POC (controlled diabetic range)    POCT INR  Result Value Ref Range   INR 2.6 2.0 - 3.0   Review of Systems  Constitutional: Negative for activity change, fatigue and fever.  HENT: Negative for congestion and rhinorrhea.   Respiratory: Negative for cough and shortness of breath.   Cardiovascular: Negative for chest pain and leg swelling.  Gastrointestinal: Negative for abdominal pain, diarrhea and nausea.  Genitourinary: Negative for dysuria and hematuria.    Neurological: Negative for weakness and headaches.  Psychiatric/Behavioral: Negative for agitation and behavioral problems.       Objective:   Physical Exam Vitals signs reviewed.  Constitutional:      General: He is not in acute distress. HENT:     Head: Normocephalic and atraumatic.  Eyes:     General:        Right eye: No discharge.        Left eye: No discharge.  Neck:     Trachea: No tracheal deviation.  Cardiovascular:     Rate and Rhythm: Normal rate and regular rhythm.     Heart sounds: Normal heart sounds. No murmur.  Pulmonary:     Effort: Pulmonary effort is normal. No respiratory distress.     Breath sounds: Normal breath sounds.  Lymphadenopathy:     Cervical: No cervical adenopathy.  Skin:    General: Skin is warm and dry.  Neurological:     Mental Status: He is alert.     Coordination: Coordination normal.  Psychiatric:        Behavior: Behavior normal.    He denies dizziness with standing       Assessment & Plan:  Diabetes better control compared to where he was he denies any low sugar spells.   Anticoagulant under good control there is no sign of any bleeding issues  Atrial fibrillation is under good control  Fatigue and  tiredness but no sign of any overt CHF healthy eating and regular activity recommended  History of HTN blood pressure under good control continue current measures  Fatigue and dizziness probably a byproduct of his chronic health illness I do not find evidence of dehydration or orthostasis at this point  25 minutes was spent with the patient.  This statement verifies that 25 minutes was indeed spent with the patient.  More than 50% of this visit-total duration of the visit-was spent in counseling and coordination of care. The issues that the patient came in for today as reflected in the diagnosis (s) please refer to documentation for further details.   Patient with history of heart failure stable currently no adjustments of  medicine  Intermittent dizziness when he lays down he happens

## 2018-12-07 ENCOUNTER — Encounter: Payer: Self-pay | Admitting: Family Medicine

## 2018-12-07 LAB — CBC WITH DIFFERENTIAL/PLATELET
Basophils Absolute: 0.1 10*3/uL (ref 0.0–0.2)
Basos: 1 %
EOS (ABSOLUTE): 0.5 10*3/uL — ABNORMAL HIGH (ref 0.0–0.4)
EOS: 7 %
Hematocrit: 46.6 % (ref 37.5–51.0)
Hemoglobin: 15.7 g/dL (ref 13.0–17.7)
Immature Grans (Abs): 0.1 10*3/uL (ref 0.0–0.1)
Immature Granulocytes: 1 %
Lymphocytes Absolute: 1.1 10*3/uL (ref 0.7–3.1)
Lymphs: 15 %
MCH: 31.6 pg (ref 26.6–33.0)
MCHC: 33.7 g/dL (ref 31.5–35.7)
MCV: 94 fL (ref 79–97)
MONOS ABS: 0.6 10*3/uL (ref 0.1–0.9)
Monocytes: 9 %
Neutrophils Absolute: 4.8 10*3/uL (ref 1.4–7.0)
Neutrophils: 67 %
Platelets: 191 10*3/uL (ref 150–450)
RBC: 4.97 x10E6/uL (ref 4.14–5.80)
RDW: 12.4 % (ref 11.6–15.4)
WBC: 7.1 10*3/uL (ref 3.4–10.8)

## 2018-12-07 LAB — BASIC METABOLIC PANEL
BUN/Creatinine Ratio: 14 (ref 10–24)
BUN: 16 mg/dL (ref 8–27)
CO2: 17 mmol/L — ABNORMAL LOW (ref 20–29)
Calcium: 9.6 mg/dL (ref 8.6–10.2)
Chloride: 102 mmol/L (ref 96–106)
Creatinine, Ser: 1.15 mg/dL (ref 0.76–1.27)
GFR calc Af Amer: 67 mL/min/{1.73_m2} (ref >59)
GFR calc non Af Amer: 58 mL/min/{1.73_m2} — ABNORMAL LOW (ref >59)
Glucose: 282 mg/dL — ABNORMAL HIGH (ref 65–99)
Potassium: 4.3 mmol/L (ref 3.5–5.2)
Sodium: 140 mmol/L (ref 134–144)

## 2018-12-31 ENCOUNTER — Other Ambulatory Visit: Payer: Self-pay | Admitting: *Deleted

## 2018-12-31 ENCOUNTER — Telehealth: Payer: Self-pay | Admitting: Family Medicine

## 2018-12-31 MED ORDER — BLOOD GLUCOSE METER KIT: PACK | 5 refills | Status: DC

## 2018-12-31 NOTE — Telephone Encounter (Signed)
Richardson Landry notified rx sent to [facility].

## 2018-12-31 NOTE — Telephone Encounter (Signed)
Patient is needing a new prescription for glucose monitor called into [facility]

## 2019-01-02 DIAGNOSIS — E119 Type 2 diabetes mellitus without complications: Secondary | ICD-10-CM | POA: Diagnosis not present

## 2019-01-08 ENCOUNTER — Ambulatory Visit (INDEPENDENT_AMBULATORY_CARE_PROVIDER_SITE_OTHER): Payer: PPO

## 2019-01-08 DIAGNOSIS — Z7901 Long term (current) use of anticoagulants: Secondary | ICD-10-CM

## 2019-01-08 LAB — POCT INR: INR: 3.3 — AB (ref 2.0–3.0)

## 2019-01-08 NOTE — Patient Instructions (Signed)
Take 1/2 tablet on Sunday,Tues,Thurs,and take whole tablet all other days.

## 2019-01-30 DIAGNOSIS — E119 Type 2 diabetes mellitus without complications: Secondary | ICD-10-CM | POA: Diagnosis not present

## 2019-02-04 ENCOUNTER — Other Ambulatory Visit: Payer: Self-pay | Admitting: Family Medicine

## 2019-02-06 ENCOUNTER — Ambulatory Visit: Payer: PPO

## 2019-03-06 ENCOUNTER — Other Ambulatory Visit: Payer: Self-pay | Admitting: Family Medicine

## 2019-03-07 NOTE — Telephone Encounter (Signed)
May have this +6 months refill Please make sure patient will be coming next week for office visit check If family would like to do so this can be made until morning appointment what ever works best for them It is best that this be in the office to check an INR (You may need to assure the family that we are very low flow in the office it would be quick in and out)

## 2019-03-08 ENCOUNTER — Telehealth: Payer: Self-pay | Admitting: Family Medicine

## 2019-03-08 NOTE — Telephone Encounter (Signed)
I agree this patient does need to be in the office so we can check INR We will do office visit same time

## 2019-03-08 NOTE — Telephone Encounter (Signed)
Is patient suppose to come in the office for appt next week? [name] said that he needs to have his bloodwork done because it has been a while. [phone]

## 2019-03-08 NOTE — Telephone Encounter (Addendum)
Nephew (DPR) notified and verbalized understanding

## 2019-03-12 ENCOUNTER — Ambulatory Visit (INDEPENDENT_AMBULATORY_CARE_PROVIDER_SITE_OTHER): Payer: PPO | Admitting: Family Medicine

## 2019-03-12 ENCOUNTER — Other Ambulatory Visit: Payer: Self-pay

## 2019-03-12 DIAGNOSIS — Z7901 Long term (current) use of anticoagulants: Secondary | ICD-10-CM | POA: Diagnosis not present

## 2019-03-12 DIAGNOSIS — I10 Essential (primary) hypertension: Secondary | ICD-10-CM | POA: Diagnosis not present

## 2019-03-12 LAB — POCT INR: INR: 3.6 — AB (ref 2.0–3.0)

## 2019-03-12 MED ORDER — HYDROCODONE-ACETAMINOPHEN 5-325 MG PO TABS: 1.0000 | ORAL_TABLET | ORAL | 0 refills | Status: AC | PRN

## 2019-03-12 NOTE — Progress Notes (Signed)
   Subjective:    Patient ID: [name] [name], male    DOB: May 25, 1932, 85 y.o.   MRN: [id number]  Hypertension  This is a chronic problem. The current episode started more than 1 year ago. Pertinent negatives include no chest pain, headaches or shortness of breath. Risk factors for coronary artery disease include diabetes mellitus, dyslipidemia, male gender and sedentary lifestyle. Treatments tried: lasix.   Patient also needs refill on pain medication and INR The patient uses a pain medicine sparingly to help him with his back and arthritis denies abusing it  Patient does take his Coumadin on a regular basis it is been 2 months since last time he checked it.  Currently we are in a pandemic.  Therefore INR checked today.  Denies any bleeding issues. Results for orders placed or performed in visit on 03/12/19  POCT INR  Result Value Ref Range   INR 3.6 (A) 2.0 - 3.0      Review of Systems  Constitutional: Negative for activity change.  HENT: Negative for congestion and rhinorrhea.   Respiratory: Negative for cough and shortness of breath.   Cardiovascular: Negative for chest pain.  Gastrointestinal: Negative for abdominal pain, diarrhea, nausea and vomiting.  Genitourinary: Negative for dysuria and hematuria.  Neurological: Negative for weakness and headaches.  Psychiatric/Behavioral: Negative for behavioral problems and confusion.       Objective:   Physical Exam Vitals signs reviewed.  Cardiovascular:     Rate and Rhythm: Normal rate and regular rhythm.     Heart sounds: Normal heart sounds. No murmur.  Pulmonary:     Effort: Pulmonary effort is normal.     Breath sounds: Normal breath sounds.  Lymphadenopathy:     Cervical: No cervical adenopathy.  Neurological:     Mental Status: He is alert.  Psychiatric:        Behavior: Behavior normal.           Assessment & Plan:  Monica Coumadin adjusted downward on the INR 4 days a week we will do a half a tablet 3 days  a week a whole tablet  Patient will follow-up in 2 weeks for a outpatient INR this will be done in the parking lot to save him the trip in  Patient will do a follow-up office visit in approximately 6 to 8 weeks check A1c at that visit

## 2019-03-20 DIAGNOSIS — Z85828 Personal history of other malignant neoplasm of skin: Secondary | ICD-10-CM | POA: Diagnosis not present

## 2019-03-20 DIAGNOSIS — L570 Actinic keratosis: Secondary | ICD-10-CM | POA: Diagnosis not present

## 2019-03-20 DIAGNOSIS — C44629 Squamous cell carcinoma of skin of left upper limb, including shoulder: Secondary | ICD-10-CM | POA: Diagnosis not present

## 2019-03-20 DIAGNOSIS — D225 Melanocytic nevi of trunk: Secondary | ICD-10-CM | POA: Diagnosis not present

## 2019-03-20 DIAGNOSIS — Z08 Encounter for follow-up examination after completed treatment for malignant neoplasm: Secondary | ICD-10-CM | POA: Diagnosis not present

## 2019-03-20 DIAGNOSIS — X32XXXD Exposure to sunlight, subsequent encounter: Secondary | ICD-10-CM | POA: Diagnosis not present

## 2019-03-26 ENCOUNTER — Other Ambulatory Visit: Payer: Self-pay

## 2019-03-26 ENCOUNTER — Ambulatory Visit (INDEPENDENT_AMBULATORY_CARE_PROVIDER_SITE_OTHER): Payer: PPO | Admitting: *Deleted

## 2019-03-26 DIAGNOSIS — Z7901 Long term (current) use of anticoagulants: Secondary | ICD-10-CM | POA: Diagnosis not present

## 2019-03-26 LAB — POCT INR: INR: 2.3 (ref 2.0–3.0)

## 2019-03-27 ENCOUNTER — Telehealth: Payer: Self-pay | Admitting: Cardiovascular Disease

## 2019-03-27 NOTE — Telephone Encounter (Signed)
Patient set up for MyChart? DECLINED   Is patient using Smartphone/computer/tablet? NO  Did audio/video work?  Does patient need telephone visit?YES   Best phone number to use? [phone] HOME NUMBER   Special Instructions? WILL TRY THERE BEST TO GET VITALS     Virtual Visit Pre-Appointment Phone Call  "(Name), I am calling you today to discuss your upcoming appointment. We are currently trying to limit exposure to the virus that causes COVID-19 by seeing patients at home rather than in the office."  1. "What is the BEST phone number to call the day of the visit?" - include this in appointment notes  2. Do you have or have access to (through a family member/friend) a smartphone with video capability that we can use for your visit?" a. If yes - list this number in appt notes as cell (if different from BEST phone #) and list the appointment type as a VIDEO visit in appointment notes b. If no - list the appointment type as a PHONE visit in appointment notes  3. Confirm consent - "In the setting of the current Covid19 crisis, you are scheduled for a (phone or video) visit with your provider on (date) at (time).  Just as we do with many in-office visits, in order for you to participate in this visit, we must obtain consent.  If you'd like, I can send this to your mychart (if signed up) or email for you to review.  Otherwise, I can obtain your verbal consent now.  All virtual visits are billed to your insurance company just like a normal visit would be.  By agreeing to a virtual visit, we'd like you to understand that the technology does not allow for your provider to perform an examination, and thus may limit your provider's ability to fully assess your condition. If your provider identifies any concerns that need to be evaluated in person, we will make arrangements to do so.  Finally, though the technology is pretty good, we cannot assure that it will always work on either your or our end,  and in the setting of a video visit, we may have to convert it to a phone-only visit.  In either situation, we cannot ensure that we have a secure connection.  Are you willing to proceed?" STAFF: Did the patient verbally acknowledge consent to telehealth visit? Document YES/NO here:  YES - NEPHEW GAVE CONSENT   4. Advise patient to be prepared - "Two hours prior to your appointment, go ahead and check your blood pressure, pulse, oxygen saturation, and your weight (if you have the equipment to check those) and write them all down. When your visit starts, your provider will ask you for this information. If you have an Apple Watch or Kardia device, please plan to have heart rate information ready on the day of your appointment. Please have a pen and paper handy nearby the day of the visit as well."  5. Give patient instructions for MyChart download to smartphone OR [email or web address] as below if video visit (depending on what platform provider is using)  6. Inform patient they will receive a phone call 15 minutes prior to their appointment time (may be from unknown caller ID) so they should be prepared to answer    TELEPHONE CALL NOTE  Adir A Krizek has been deemed a candidate for a follow-up tele-health visit to limit community exposure during the Covid-19 pandemic. I spoke with the patient via phone to ensure availability of phone/video source, confirm preferred  email & phone number, and discuss instructions and expectations.  I reminded Nasiir A Steen to be prepared with any vital sign and/or heart rhythm information that could potentially be obtained via home monitoring, at the time of his visit. I reminded Ark A Llanas to expect a phone call prior to his visit.  Howie Ill 03/27/2019 4:25 PM   INSTRUCTIONS FOR DOWNLOADING THE MYCHART APP TO SMARTPHONE  - The patient must first make sure to have activated MyChart and know their login information - If Apple, go to [employer] and type in  MyChart in the search bar and download the app. If Android, ask patient to go to [employer] and type in [place] in the search bar and download the app. The app is free but as with any other app downloads, their phone may require them to verify saved payment information or Apple/Android password.  - The patient will need to then log into the app with their MyChart username and password, and select [facility] as their healthcare provider to link the account. When it is time for your visit, go to the MyChart app, find appointments, and click Begin Video Visit. Be sure to Select Allow for your device to access the Microphone and Camera for your visit. You will then be connected, and your provider will be with you shortly.  **If they have any issues connecting, or need assistance please contact MyChart service desk [phone])**  **If using a computer, in order to ensure the best quality for their visit they will need to use either of the following Internet Browsers: [employer], or Google Chrome**  IF USING DOXIMITY or DOXY.ME - The patient will receive a link just prior to their visit by text.     FULL LENGTH CONSENT FOR TELE-HEALTH VISIT   I hereby voluntarily request, consent and authorize [facility] and its employed or contracted physicians, physician assistants, nurse practitioners or other licensed health care professionals (the Practitioner), to provide me with telemedicine health care services (the Services") as deemed necessary by the treating Practitioner. I acknowledge and consent to receive the Services by the Practitioner via telemedicine. I understand that the telemedicine visit will involve communicating with the Practitioner through live audiovisual communication technology and the disclosure of certain medical information by electronic transmission. I acknowledge that I have been given the opportunity to request an in-person assessment or other available  alternative prior to the telemedicine visit and am voluntarily participating in the telemedicine visit.  I understand that I have the right to withhold or withdraw my consent to the use of telemedicine in the course of my care at any time, without affecting my right to future care or treatment, and that the Practitioner or I may terminate the telemedicine visit at any time. I understand that I have the right to inspect all information obtained and/or recorded in the course of the telemedicine visit and may receive copies of available information for a reasonable fee.  I understand that some of the potential risks of receiving the Services via telemedicine include:   Delay or interruption in medical evaluation due to technological equipment failure or disruption;  Information transmitted may not be sufficient (e.g. poor resolution of images) to allow for appropriate medical decision making by the Practitioner; and/or   In rare instances, security protocols could fail, causing a breach of personal health information.  Furthermore, I acknowledge that it is my responsibility to provide information about my medical history, conditions and  care that is complete and accurate to the best of my ability. I acknowledge that Practitioner's advice, recommendations, and/or decision may be based on factors not within their control, such as incomplete or inaccurate data provided by me or distortions of diagnostic images or specimens that may result from electronic transmissions. I understand that the practice of medicine is not an exact science and that Practitioner makes no warranties or guarantees regarding treatment outcomes. I acknowledge that I will receive a copy of this consent concurrently upon execution via email to the email address I last provided but may also request a printed copy by calling the office of [facility].    I understand that my insurance will be billed for this visit.   I have read or had  this consent read to me.  I understand the contents of this consent, which adequately explains the benefits and risks of the Services being provided via telemedicine.   I have been provided ample opportunity to ask questions regarding this consent and the Services and have had my questions answered to my satisfaction.  I give my informed consent for the services to be provided through the use of telemedicine in my medical care  By participating in this telemedicine visit I agree to the above.

## 2019-04-01 ENCOUNTER — Other Ambulatory Visit: Payer: Self-pay

## 2019-04-01 ENCOUNTER — Encounter: Payer: Self-pay | Admitting: Cardiovascular Disease

## 2019-04-01 ENCOUNTER — Telehealth (INDEPENDENT_AMBULATORY_CARE_PROVIDER_SITE_OTHER): Payer: PPO | Admitting: Cardiovascular Disease

## 2019-04-01 VITALS — BP 167/88 | HR 79 | Ht 73.0 in | Wt 237.8 lb

## 2019-04-01 DIAGNOSIS — I4821 Permanent atrial fibrillation: Secondary | ICD-10-CM | POA: Diagnosis not present

## 2019-04-01 DIAGNOSIS — I5022 Chronic systolic (congestive) heart failure: Secondary | ICD-10-CM

## 2019-04-01 DIAGNOSIS — Z952 Presence of prosthetic heart valve: Secondary | ICD-10-CM

## 2019-04-01 NOTE — Progress Notes (Signed)
Virtual Visit via Telephone Note   This visit type was conducted due to national recommendations for restrictions regarding the COVID-19 Pandemic (e.g. social distancing) in an effort to limit this patient's exposure and mitigate transmission in our community.  Due to his co-morbid illnesses, this patient is at least at moderate risk for complications without adequate follow up.  This format is felt to be most appropriate for this patient at this time.  The patient did not have access to video technology/had technical difficulties with video requiring transitioning to audio format only (telephone).  All issues noted in this document were discussed and addressed.  No physical exam could be performed with this format.  Please refer to the patient's chart for his  consent to telehealth for [name] [name].   Date:  04/01/2019   ID:  [name], DOB January 14, 1932, MRN [id number]  Patient Location: Home Provider Location: Home  PCP:  Kathyrn Drown, MD  Cardiologist:  No primary care provider on file.  Electrophysiologist:  None   Evaluation Performed:  Follow-Up Visit  Chief Complaint:  Follow-up valvular heart disease and chronic heart failure  History of Present Illness:    [name] [name] is a 85 y.o. male with a history of aortic valve disease status post TAVR in 2017.  At that time the patient was treated for severe low-flow low gradient aortic stenosis with a 29 mmSapien 3 valve.  Subsequent echo imaging has demonstrated normal function of the transcatheter heart valve with low transvalvular gradients and no significant paravalvular regurgitation.  His LVEF is been in the range of 35 to 40%.  The patient does not have symptoms concerning for COVID-19 infection (fever, chills, cough, or new shortness of breath).    The patient is interviewed via telephone today in light of the current COVID-19 pandemic.  He has mild chronic leg edema, but no other specific cardiac complaints.  He denies  chest pain, chest pressure, shortness of breath, heart palpitations, orthopnea, or PND.  He is primarily limited by back and leg problems.  He continues on warfarin and just had a recent INR of 2.3.  His labs were last checked in January when his CBC was essentially normal and his renal function was demonstrated to be stable with a creatinine of 1.15.  Past Medical History:  Diagnosis Date  . Aortic stenosis    a. mod-sev by echo 05/2016.  . Asthma   . Cerebrovascular disease 07/2010   TIA; carotid ultrasound in 07/2010-significant bilateral plaque without focal internal carotid artery stenosis; MRI -encephalomalacia left temporal and right temporal lobes; small inferior right cerebellar infarct; small vessel disease  . Chronic atrial fibrillation    Paroxysmal; Echocardiogram in 2007-normal EF; mild LVH; left atrial enlargement; mild stenosis and minimal AI; negative stress nuclear study in 2008  . Chronic systolic CHF (congestive heart failure) ([place])    a. dx 05/2016 - EF 35-40%, diffuse HK, mod-severe AS, mod gradient, severe AVA VTI likely due to decreased cardiac output in setting of systolic dysfsunction and significant mitral regurgitation, mild MR, mod-severe MR, severe LAE, mild-mod RV dilation, mild RAE, mild-mod TR, mod PASP 20mHg.  . CKD (chronic kidney disease), stage II   . Degenerative joint disease    feet and legs  . Diabetes mellitus    no insulin; A1c of 6.6 in 2005  . Dizziness    occurs daily,especially in am  . Exertional dyspnea   . Gastroesophageal reflux disease   . Hepatic steatosis   .  History of noncompliance with medical treatment   . Hyperlipidemia    adverse reactions to statins and niacin  . Hypertension    Borderline  . Irregular heartbeat   . Mitral regurgitation    a. mod-sev by echo 05/2016  . Peripheral vascular disease ([place])   . Renal insufficiency   . S/P TAVR (transcatheter aortic valve replacement) 07/19/2016   29 mm Edwards Sapien 3 transcatheter  heart valve placed via left percutaneous transfemoral approach  . Temporal arteritis ([place])   . Tricuspid regurgitation    a. mild-mod TR by echo 05/2016   Past Surgical History:  Procedure Laterality Date  . COLONOSCOPY  2002  . COLONOSCOPY  01/19/2012   Procedure: COLONOSCOPY;  Surgeon: Rogene Houston, MD;  [facility];  Service: Endoscopy;  Laterality: N/A;  1030  . LIPOMA EXCISION  1980  . ORIF ANKLE FRACTURE  2000   Right  . PERIPHERAL VASCULAR CATHETERIZATION N/A 07/11/2016   Procedure: Carotid PTA/Stent Intervention;  Surgeon: Lorretta Harp, MD;  Location: [facility];  Service: Cardiovascular;  Laterality: N/A;  . PROSTATE SURGERY  12/2011  . ROTATOR CUFF REPAIR     Right  . TEE WITHOUT CARDIOVERSION N/A 06/17/2016   Procedure: TRANSESOPHAGEAL ECHOCARDIOGRAM (TEE);  Surgeon: Jerline Pain, MD;  Location: [facility];  Service: Cardiovascular;  Laterality: N/A;  . TEE WITHOUT CARDIOVERSION N/A 07/19/2016   Procedure: TRANSESOPHAGEAL ECHOCARDIOGRAM (TEE);  Surgeon: Sherren Mocha, MD;  Location: [facility];  Service: Open Heart Surgery;  Laterality: N/A;  . TRANSCATHETER AORTIC VALVE REPLACEMENT, TRANSFEMORAL N/A 07/19/2016   Procedure: TRANSCATHETER AORTIC VALVE REPLACEMENT, TRANSFEMORAL;  Surgeon: Sherren Mocha, MD;  Location: [facility];  Service: Open Heart Surgery;  Laterality: N/A;  . TRANSURETHRAL RESECTION OF PROSTATE  09/2011  . URETHRAL STRICTURE DILATATION  1980s     Current Meds  Medication Sig  . albuterol (PROVENTIL) (2.5 MG/3ML) 0.083% nebulizer solution Take 3 mLs (2.5 mg total) by nebulization every 6 (six) hours as needed for wheezing or shortness of breath.  . bisoprolol (ZEBETA) 5 MG tablet TAKE (1/2) TABLET BY MOUTH ONCE DAILY.  . blood glucose meter kit and supplies Dispense based on patient and insurance preference. Use to test glucose three times daily.  (FOR ICD-10)  . cholecalciferol (VITAMIN D) 1000 units tablet Take 1,000 Units by mouth daily.  .  fluticasone (FLONASE) 50 MCG/ACT nasal spray 2 SPRAYS EACH NOSTRIL ONCE DAILY.  . hydrOXYzine (ATARAX/VISTARIL) 25 MG tablet Take 1 tablet (25 mg total) by mouth every 6 (six) hours as needed. Stop benadryl  . Insulin Glargine (LANTUS SOLOSTAR) 100 UNIT/ML Solostar Pen INJECT 14 UNITS EVERY EVENING. MAY TITRATE UP TO 20 UNITS WITH DR'S DIRECTION.  . metFORMIN (GLUCOPHAGE-XR) 500 MG 24 hr tablet TAKE 1 TABLET BY MOUTH ONCE DAILY WITH BREAKFAST.  [name] nortriptyline (PAMELOR) 10 MG capsule TAKE 1 OR 2 CAPSULES AT BEDTIME FOR BURNING IN FEET.[28 IN PACKS/ NONE IN BOTTLE]  . potassium chloride SA (K-DUR,KLOR-CON) 20 MEQ tablet TAKE 1 TABLET BY MOUTH ON MONDAY, WEDNESDAY AND FRIDAY.  . rosuvastatin (CRESTOR) 5 MG tablet TAKE (1) TABLET BY MOUTH ONCE DAILY.  [name] TRUEPLUS PEN NEEDLES 31G X 8 MM MISC USE WITH LANTUS SOLOSTAR EVERY EVENING  . warfarin (COUMADIN) 6 MG tablet TAKE 1 TABLET AS DIRECTED.     Allergies:   Ambien [zolpidem tartrate]; Lipitor [atorvastatin calcium]; Ranitidine; Simvastatin; Xanax xr [alprazolam er]; Cholestatin; and Clopidogrel bisulfate   Social History   Tobacco Use  .  Smoking status: Former Smoker    Packs/day: 1.00    Years: 20.00    Pack years: 20.00    Types: Cigarettes    Start date: 07/04/1950    Last attempt to quit: 04/25/1992    Years since quitting: 26.9  . Smokeless tobacco: Current User    Types: Chew  Substance Use Topics  . Alcohol use: No    Alcohol/week: 0.0 standard drinks  . Drug use: No     Family Hx: The patient's family history includes Diabetes in his father; Stroke in his mother. There is no history of Colon cancer.  ROS:   Please see the history of present illness.    Positive for back pain, leg pain, memory loss. All other systems reviewed and are negative.   Prior CV studies:   The following studies were reviewed today:  Echocardiogram 11/01/2017: Study Conclusions  - Left ventricle: The cavity size was normal. Wall thickness was    increased in a pattern of mild LVH. Systolic function was   moderately reduced. The estimated ejection fraction was in the   range of 35% to 40%. Diffuse hypokinesis. The study is not   technically sufficient to allow evaluation of LV diastolic   function. - Aortic valve: 29 mm Edwards Sapien 3 transcatheter heart valve in   aortic postion. There was no significant regurgitation. Mean   gradient (S): 5 mm Hg. - Mitral valve: Mildly calcified annulus. Mildly thickened, mildly   calcified leaflets . There was mild regurgitation. - Left atrium: The atrium was moderately to severely dilated. - Right atrium: The atrium was mildly dilated. Central venous   pressure (est): 3 mm Hg. - Tricuspid valve: There was mild regurgitation. - Pulmonary arteries: PA peak pressure: 44 mm Hg (S). - Pericardium, extracardiac: There was no pericardial effusion.  Impressions:  - Mild LVH with LVEF approximately 35-40%. There is diffuse   hypokinesis with regional variation. Indeterminate diastolic   function in the setting of atrial fibrillation. Moderate to   severe left atrial enlargement. Mildly calcified mitral annulus   as well as thickened and calcified leaflets. Mild mitral   regurgitation. Bioprosthetic aortic valve is stable in position   without aortic regurgitation and stable gradients compared to the   previous study. Mild tricuspid regurgitation with PASP estimated   44 mmHg.   Labs/Other Tests and Data Reviewed:    EKG:  An ECG dated 07/10/2018 was personally reviewed today and demonstrated:  Atrial fibrillation with nonspecific IVCD  Recent Labs: 07/10/2018: B Natriuretic Peptide 67.0 08/15/2018: ALT 55 12/06/2018: BUN 16; Creatinine, Ser 1.15; Hemoglobin 15.7; Platelets 191; Potassium 4.3; Sodium 140   Recent Lipid Panel Lab Results  Component Value Date/Time   CHOL 178 08/15/2018 02:30 PM   TRIG 288 (H) 08/15/2018 02:30 PM   HDL 45 08/15/2018 02:30 PM   CHOLHDL 4.0 08/15/2018  02:30 PM   CHOLHDL 5.9 08/01/2014 10:03 AM   LDLCALC 75 08/15/2018 02:30 PM    Wt Readings from Last 3 Encounters:  03/28/19 237 lb 12.8 oz (107.9 kg)  12/06/18 246 lb 12.8 oz (111.9 kg)  11/05/18 243 lb 9.6 oz (110.5 kg)     Objective:    Vital Signs:  BP (!) 167/88 (BP Location: Right Arm, Patient Position: Sitting, Cuff Size: Normal)   Pulse 79   Ht _0  (1.854 m)   Wt 237 lb 12.8 oz (107.9 kg)   SpO2 99%   BMI 31.37 kg/m    VITAL SIGNS:  reviewed The patient is alert, oriented, in no distress.  He is breathing comfortably with normal conversation.  Remaining exam is not performed today as this is a virtual/telephone visit  ASSESSMENT & PLAN:    1. Aortic valve disease status post TAVR: The patient appears stable on warfarin anticoagulation.  His transcatheter heart valve function is normal by follow-up echo after undergoing TAVR in 2017.  He understands the need for lifelong SBE prophylaxis when indicated. 2. Coronary artery disease, native vessel, without angina: The patient was found to have moderate coronary artery disease when he underwent preoperative heart catheterization in 2017.  He has done well with a strategy of medical therapy. 3. Permanent atrial fibrillation: Appropriately anticoagulated with warfarin.  Most recent INR in therapeutic range.  Heart rate has been well controlled over time.  COVID-19 Education: The signs and symptoms of COVID-19 were discussed with the patient and how to seek care for testing (follow up with PCP or arrange E-visit).  The importance of social distancing was discussed today.  Time:   Today, I have spent 12 minutes with the patient with telehealth technology discussing the above problems.     Medication Adjustments/Labs and Tests Ordered: Current medicines are reviewed at length with the patient today.  Concerns regarding medicines are outlined above.   Tests Ordered: No orders of the defined types were placed in this encounter.    Medication Changes: No orders of the defined types were placed in this encounter.   Disposition:  Follow up in 1 year(s)  Signed, Sherren Mocha, MD  04/01/2019 9:33 AM    [place]

## 2019-04-10 DIAGNOSIS — E119 Type 2 diabetes mellitus without complications: Secondary | ICD-10-CM | POA: Diagnosis not present

## 2019-04-24 ENCOUNTER — Telehealth: Payer: Self-pay | Admitting: Family Medicine

## 2019-04-24 ENCOUNTER — Other Ambulatory Visit: Payer: Self-pay

## 2019-04-24 ENCOUNTER — Ambulatory Visit (INDEPENDENT_AMBULATORY_CARE_PROVIDER_SITE_OTHER): Payer: PPO | Admitting: Family Medicine

## 2019-04-24 DIAGNOSIS — L03119 Cellulitis of unspecified part of limb: Secondary | ICD-10-CM

## 2019-04-24 DIAGNOSIS — L02519 Cutaneous abscess of unspecified hand: Secondary | ICD-10-CM

## 2019-04-24 MED ORDER — DOXYCYCLINE HYCLATE 100 MG PO TABS: 100.0000 mg | ORAL_TABLET | Freq: Two times a day (BID) | ORAL | 0 refills | Status: DC

## 2019-04-24 NOTE — Telephone Encounter (Signed)
Phone visit this aft

## 2019-04-24 NOTE — Telephone Encounter (Signed)
Contact [name] [name] (on [place]) and she states that bite happened last Thursday or Friday; the swollen area is not going down. No fever, no trouble breathing, no shortness of breath. No loss of movement. Please advise. Thank you.

## 2019-04-24 NOTE — Progress Notes (Signed)
   Subjective:    Patient ID: [name] [name], male    DOB: May 15, 1932, 85 y.o.   MRN: [id number]  Pt is with niece [name] [name]  HPI Right hand swelling and pain. Started last 5 days. Has tried ice and cortisone cream for itching.   Virtual Visit via Video Note  I connected with [place] on 04/24/19 at  4:00 PM EDT by a video enabled telemedicine application and verified that I am speaking with the correct person using two identifiers.  Location: Patient: home Provider: office   I discussed the limitations of evaluation and management by telemedicine and the availability of in person appointments. The patient expressed understanding and agreed to proceed.  History of Present Illness:    Observations/Objective:   Assessment and Plan:   Follow Up Instructions:    I discussed the assessment and treatment plan with the patient. The patient was provided an opportunity to ask questions and all were answered. The patient agreed with the plan and demonstrated an understanding of the instructions.   The patient was advised to call back or seek an in-person evaluation if the symptoms worsen or if the condition fails to improve as anticipated.  I provided 20 minutes of non-face-to-face time during this encounter.   Several days duration primarily per caretaker tender swallowing no fever no chills recalls no injury recalls no bite   Review of Systems No headache, no major weight loss or weight gain, no chest pain no back pain abdominal pain no change in bowel habits complete ROS otherwise negative     Objective:   Physical Exam   Virtual visit right hand noticeably swollen on the camera     Assessment & Plan:  Impression cellulitis of the hand versus arthritis, will cover the 1 cannot be missed.  Trial of antibiotics.  Local measures discussed if persists recommend follow-up

## 2019-04-24 NOTE — Telephone Encounter (Signed)
Has a spot on hand for several days, not sure if it is a bite. Hand is swollen, possibly arm starting to swell. [name] is unsure if they know how to do a virtual visit. Phone? Office? Or Car patient?

## 2019-04-24 NOTE — Telephone Encounter (Signed)
Inez Catalina contacted and transferred up front to set up appt.

## 2019-05-07 ENCOUNTER — Ambulatory Visit (INDEPENDENT_AMBULATORY_CARE_PROVIDER_SITE_OTHER): Payer: PPO | Admitting: Family Medicine

## 2019-05-07 ENCOUNTER — Other Ambulatory Visit: Payer: Self-pay

## 2019-05-07 DIAGNOSIS — Z7901 Long term (current) use of anticoagulants: Secondary | ICD-10-CM | POA: Diagnosis not present

## 2019-05-07 DIAGNOSIS — L03119 Cellulitis of unspecified part of limb: Secondary | ICD-10-CM

## 2019-05-07 DIAGNOSIS — B356 Tinea cruris: Secondary | ICD-10-CM

## 2019-05-07 MED ORDER — KETOCONAZOLE 2 % EX CREA: 1.0000 "application " | TOPICAL_CREAM | Freq: Two times a day (BID) | CUTANEOUS | 4 refills | Status: DC

## 2019-05-07 NOTE — Progress Notes (Signed)
   Subjective:    Patient ID: [name], male    DOB: 1932-03-18, 85 y.o.   MRN: [id number]  HPIfollow up cellulitis of hand. Pt's niece [name] states he finished doxy but his hand is still swollen.   Pt has been complaining of his testicles itching. States they are red and swollen. Tried a and d ointment and jock itch spray.  Patient relates testicles itching burning some redness on the video it does have some redness he was recently on antibiotics  His breathing overall is doing okay appetite doing okay is on his Coumadin for atrial fibrillation. Virtual Visit via Video Note  I connected with [place] on 05/07/19 at  2:00 PM EDT by a video enabled telemedicine application and verified that I am speaking with the correct person using two identifiers.  Location: Patient: home Provider: office   I discussed the limitations of evaluation and management by telemedicine and the availability of in person appointments. The patient expressed understanding and agreed to proceed.  History of Present Illness:    Observations/Objective:   Assessment and Plan:   Follow Up Instructions:    I discussed the assessment and treatment plan with the patient. The patient was provided an opportunity to ask questions and all were answered. The patient agreed with the plan and demonstrated an understanding of the instructions.   The patient was advised to call back or seek an in-person evaluation if the symptoms worsen or if the condition fails to improve as anticipated.  I provided 15 minutes of non-face-to-face time during this encounter.      Review of Systems  Constitutional: Negative for diaphoresis and fatigue.  HENT: Negative for congestion and rhinorrhea.   Respiratory: Negative for cough and shortness of breath.   Cardiovascular: Negative for chest pain and leg swelling.  Gastrointestinal: Negative for abdominal pain and diarrhea.  Skin: Negative for color change and  rash.  Neurological: Negative for dizziness and headaches.  Psychiatric/Behavioral: Negative for behavioral problems and confusion.       Objective:   Physical Exam  Patient had virtual visit Appears to be in no distress Atraumatic Neuro able to relate and oriented No apparent resp distress Color normal       Assessment & Plan:  His hand infection much better swelling is gone down no need for any type of ongoing medicine  I do recommend INR this week currently having no bleeding trouble  Patient more than likely has tinea cruris recommend Nizoral cream on a regular basis hopefully this will help him  Follow-up office visit within 3 months

## 2019-05-09 ENCOUNTER — Ambulatory Visit (INDEPENDENT_AMBULATORY_CARE_PROVIDER_SITE_OTHER): Payer: PPO | Admitting: Family Medicine

## 2019-05-09 ENCOUNTER — Other Ambulatory Visit: Payer: Self-pay

## 2019-05-09 DIAGNOSIS — Z7901 Long term (current) use of anticoagulants: Secondary | ICD-10-CM

## 2019-05-09 DIAGNOSIS — L03119 Cellulitis of unspecified part of limb: Secondary | ICD-10-CM | POA: Diagnosis not present

## 2019-05-09 LAB — POCT INR: INR: 2.3 (ref 2.0–3.0)

## 2019-05-09 NOTE — Progress Notes (Signed)
There is concerned that this patient is having ongoing cellulitis of the arm and hand the caretaker states that hands appear swollen today was recently on antibiotics also he has some intermittent swelling in the lower legs he has known heart disease and known history of cellulitis of the hand please see previous notes denies any other particular troubles no fever chills vomiting diarrhea activity level okay appetite okay no bleeding issues INR today looked good I went out to the car I examined him he is not having any cellulitis he has minimal swelling in his hands minimal swelling in the ankles his lungs are clear heart rate is controlled I find no evidence of ongoing infection reassurance given medications were encouraged to be continued of his standard medicines and to follow-up if ongoing troubles or problems warning signs were discussed  15 minutes was spent with patient today discussing healthcare issues which they came.  More than 50% of this visit-total duration of visit-was spent in counseling and coordination of care.  Please see diagnosis regarding the focus of this coordination and care

## 2019-05-09 NOTE — Addendum Note (Signed)
Addended by: Sallee Lange A on: 05/09/2019 08:01 PM   Modules accepted: Level of Service

## 2019-05-10 ENCOUNTER — Other Ambulatory Visit: Payer: Self-pay | Admitting: Family Medicine

## 2019-05-20 DIAGNOSIS — E119 Type 2 diabetes mellitus without complications: Secondary | ICD-10-CM | POA: Diagnosis not present

## 2019-06-03 DIAGNOSIS — L821 Other seborrheic keratosis: Secondary | ICD-10-CM | POA: Diagnosis not present

## 2019-06-03 DIAGNOSIS — C44222 Squamous cell carcinoma of skin of right ear and external auricular canal: Secondary | ICD-10-CM | POA: Diagnosis not present

## 2019-06-03 DIAGNOSIS — C44629 Squamous cell carcinoma of skin of left upper limb, including shoulder: Secondary | ICD-10-CM | POA: Diagnosis not present

## 2019-06-03 DIAGNOSIS — L570 Actinic keratosis: Secondary | ICD-10-CM | POA: Diagnosis not present

## 2019-06-03 DIAGNOSIS — X32XXXD Exposure to sunlight, subsequent encounter: Secondary | ICD-10-CM | POA: Diagnosis not present

## 2019-06-20 ENCOUNTER — Telehealth: Payer: Self-pay | Admitting: Family Medicine

## 2019-06-20 NOTE — Telephone Encounter (Signed)
Insurance nurse came to see pt, please see copy of report (in box on wall)  She suggested that the patient take rosuvastatin (CRESTOR) 5 MG tablet  In the evening around dinner time as it tends to help better throughout the night due to pt having a stent - please advise if ok to change this from a morning medicine to an evening medicine  Also please see the mini-cog type test on the form, pt had right much trouble drawing the clock face, any suggestions on this - please advise  (Pt is coming 06/21/2019 1:30 for INR as a car visit)

## 2019-06-20 NOTE — Telephone Encounter (Signed)
Crestor may be changed as a supper medicine as long as he will remember to take it and then May need to coordinate this with his pharmacy As for the clock drawing aspect we will table back for now

## 2019-06-21 ENCOUNTER — Other Ambulatory Visit (INDEPENDENT_AMBULATORY_CARE_PROVIDER_SITE_OTHER): Payer: PPO | Admitting: *Deleted

## 2019-06-21 ENCOUNTER — Other Ambulatory Visit: Payer: Self-pay | Admitting: Family Medicine

## 2019-06-21 ENCOUNTER — Other Ambulatory Visit: Payer: Self-pay

## 2019-06-21 DIAGNOSIS — Z7901 Long term (current) use of anticoagulants: Secondary | ICD-10-CM

## 2019-06-21 LAB — POCT INR: INR: 2.2 (ref 2.0–3.0)

## 2019-06-21 NOTE — Telephone Encounter (Signed)
Patient notified and can discuss further at upcoming visit 07/23/19

## 2019-06-28 ENCOUNTER — Other Ambulatory Visit: Payer: Self-pay | Admitting: Family Medicine

## 2019-07-01 DIAGNOSIS — E119 Type 2 diabetes mellitus without complications: Secondary | ICD-10-CM | POA: Diagnosis not present

## 2019-07-09 DIAGNOSIS — L570 Actinic keratosis: Secondary | ICD-10-CM | POA: Diagnosis not present

## 2019-07-09 DIAGNOSIS — X32XXXD Exposure to sunlight, subsequent encounter: Secondary | ICD-10-CM | POA: Diagnosis not present

## 2019-07-09 DIAGNOSIS — Z08 Encounter for follow-up examination after completed treatment for malignant neoplasm: Secondary | ICD-10-CM | POA: Diagnosis not present

## 2019-07-09 DIAGNOSIS — Z85828 Personal history of other malignant neoplasm of skin: Secondary | ICD-10-CM | POA: Diagnosis not present

## 2019-07-10 ENCOUNTER — Telehealth: Payer: Self-pay | Admitting: Family Medicine

## 2019-07-10 NOTE — Telephone Encounter (Signed)
The [employer] insurance faxed over form to be completed  for second time. He states  Were faxed over in June but never received please redo. In your box.

## 2019-07-18 NOTE — Telephone Encounter (Signed)
Nurses Please see form from [facility] insurance (Absolutely the patient had order signed by [place] for physical therapy evaluation and treat.  But also I cannot remember off the top of my head the reason for this and when I review over the medical records I do not clearly see back. Please discuss with insurance agent Duane Whitt or if he does not know with the patient's family the reason for his physical therapy.  Then I can properly fill in this form. Possibly the referral has a diagnosis along with the referral? To the best of the above's knowledge did [facility] provide just physical therapy or did they also provide a nurse?  Once I have this then we can properly fill out the form (Please keep the form readily available) Thank you

## 2019-07-18 NOTE — Telephone Encounter (Signed)
Pt just had physical therapy due to Ataxia. Form in provider office for further review. Please advise. Thank you.

## 2019-07-19 NOTE — Telephone Encounter (Signed)
I reviewed the form and filled in thank you

## 2019-07-23 ENCOUNTER — Other Ambulatory Visit: Payer: Self-pay

## 2019-07-23 ENCOUNTER — Ambulatory Visit (INDEPENDENT_AMBULATORY_CARE_PROVIDER_SITE_OTHER): Payer: PPO | Admitting: Family Medicine

## 2019-07-23 ENCOUNTER — Encounter: Payer: Self-pay | Admitting: Family Medicine

## 2019-07-23 VITALS — BP 138/78 | Temp 97.9°F | Wt 243.8 lb

## 2019-07-23 DIAGNOSIS — Z23 Encounter for immunization: Secondary | ICD-10-CM

## 2019-07-23 DIAGNOSIS — I10 Essential (primary) hypertension: Secondary | ICD-10-CM

## 2019-07-23 DIAGNOSIS — D692 Other nonthrombocytopenic purpura: Secondary | ICD-10-CM | POA: Diagnosis not present

## 2019-07-23 DIAGNOSIS — E785 Hyperlipidemia, unspecified: Secondary | ICD-10-CM

## 2019-07-23 DIAGNOSIS — E1142 Type 2 diabetes mellitus with diabetic polyneuropathy: Secondary | ICD-10-CM

## 2019-07-23 DIAGNOSIS — Z7901 Long term (current) use of anticoagulants: Secondary | ICD-10-CM

## 2019-07-23 DIAGNOSIS — I4811 Longstanding persistent atrial fibrillation: Secondary | ICD-10-CM

## 2019-07-23 DIAGNOSIS — Z79899 Other long term (current) drug therapy: Secondary | ICD-10-CM

## 2019-07-23 LAB — POCT INR: INR: 1.8 — AB (ref 2.0–3.0)

## 2019-07-23 MED ORDER — SHINGRIX 50 MCG/0.5ML IM SUSR: 0.5000 mL | Freq: Once | INTRAMUSCULAR | 1 refills | Status: AC

## 2019-07-23 NOTE — Progress Notes (Signed)
Subjective:    Patient ID: [name] [name], male    DOB: 31-May-1932, 85 y.o.   MRN: [id number]  HPI Pt here today for follow up and INR. Pt states he is having some lower right side pain. No issues going to the bathroom at this time.   Pt sugars is sometimes high during the day but does come back down. Pt is taking one shot during the evening of Lantus. Pt checks blood sugar BID.   (Pt states he has ordered some medication from TV for pain. Not sure of the name) Diabetes decent control but numbers are running higher than what they should run typically around 200 earlier in the day and later in the day Family wants some guidance on adjusting the insulin Blood pressure taking her medications denies any chest tightness pressure pain shortness of breath Suffers with obesity does try to watch portions cannot exercise on a regular basis Does relate a lot of low back pain and discomfort present for months previous x-rays arthritis did do some physical therapy and does try to use Tylenol and some stretching Patient also with some bruising on the arms intermittently but not severe no bleeding issues  Pt has had an insurance nurse stop by for a consultation also.  Review of Systems  Constitutional: Negative for diaphoresis and fatigue.  HENT: Negative for congestion and rhinorrhea.   Respiratory: Negative for cough and shortness of breath.   Cardiovascular: Negative for chest pain and leg swelling.  Gastrointestinal: Negative for abdominal pain and diarrhea.  Skin: Negative for color change and rash.  Neurological: Negative for dizziness and headaches.  Psychiatric/Behavioral: Negative for behavioral problems and confusion.       Objective:   Physical Exam Vitals signs reviewed.  Constitutional:      General: He is not in acute distress. HENT:     Head: Normocephalic and atraumatic.  Eyes:     General:        Right eye: No discharge.        Left eye: No discharge.  Neck:     Trachea:  No tracheal deviation.  Cardiovascular:     Rate and Rhythm: Normal rate and regular rhythm.     Heart sounds: Normal heart sounds. No murmur.  Pulmonary:     Effort: Pulmonary effort is normal. No respiratory distress.     Breath sounds: Normal breath sounds.  Lymphadenopathy:     Cervical: No cervical adenopathy.  Skin:    General: Skin is warm and dry.  Neurological:     Mental Status: He is alert.     Coordination: Coordination normal.  Psychiatric:        Behavior: Behavior normal.           Assessment & Plan:  1. Long term (current) use of anticoagulants INR 1.8 previous ones look good continue current dosing recheck again in 1 month we can do a car INR in 1 month - POCT INR  2. Essential hypertension Blood pressure under good control continue current measures watch salt diet stay active - CBC with Differential - Hemoglobin A1c - Lipid Profile - Basic Metabolic Panel (BMET) - Urine Microalbumin w/creat. ratio  3. Longstanding persistent atrial fibrillation Atrial fibrillation heart rate under good control doing well with medication monitor closely  4. Senile purpura (HCC) Senile purpura on the arms not severe expected for age  46. Type 2 diabetes mellitus with diabetic polyneuropathy, without long-term current use of insulin (HCC) Diabetes subpar control  increase long-acting insulin by 2 units new dosage will be 16 units in 1 week if still not at goal can increase it to 18 units In 2 weeks send [place] some readings - CBC with Differential - Hemoglobin A1c - Lipid Profile - Basic Metabolic Panel (BMET) - Urine Microalbumin w/creat. ratio  6. Hyperlipidemia, unspecified hyperlipidemia type Hyperlipidemia under good control continue current measures no need for any type of changes lab work ordered previous labs reviewed - CBC with Differential - Hemoglobin A1c - Lipid Profile - Basic Metabolic Panel (BMET) - Urine Microalbumin w/creat. ratio  7. Need for  vaccination Flu shot today - Flu Vaccine QUAD 6+ mos PF IM (Fluarix Quad PF)  8. High risk medication use Check CBC because of Coumadin - CBC with Differential - Hemoglobin A1c - Lipid Profile - Basic Metabolic Panel (BMET) - Urine Microalbumin w/creat. ratio  25 minutes was spent with the patient.  This statement verifies that 25 minutes was indeed spent with the patient.  More than 50% of this visit-total duration of the visit-was spent in counseling and coordination of care. The issues that the patient came in for today as reflected in the diagnosis (s) please refer to documentation for further details.  Patient also with low back pain and discomfort hurts when he walks and moves I talked with him about exercises he could do to try to help this in addition to this it is advisable for this patient to use his walker physical therapy may help some I do not recommend any injections currently but if he gets bad enough x-rays and injections would be necessary

## 2019-07-23 NOTE — Patient Instructions (Addendum)
Shingrix and shingles prevention: know the facts!   Shingrix is a very effective vaccine to prevent shingles.   Shingles is a reactivation of chickenpox -more than 99% of Americans born before 1980 have had chickenpox even if they do not remember it. One in every 10 people who get shingles have severe long-lasting nerve pain as a result.   33 out of a 100 older adults will get shingles if they are unvaccinated.     This vaccine is very important for your health This vaccine is indicated for anyone 50 years or older. You can get this vaccine even if you have already had shingles because you can get the disease more than once in a lifetime.  Your risk for shingles and its complications increases with age.  This vaccine has 2 doses.  The second dose would be 2 to 6 months after the first dose.  If you had Zostavax vaccine in the past you should still get Shingrix. ( Zostavax is only 70% effective and it loses significant strength over a few years .)  This vaccine is given through the pharmacy.  The cost of the vaccine is through your insurance. The pharmacy can inform you of the total costs.  Common side effects including soreness in the arm, some redness and swelling, also some feel fatigue muscle soreness headache low-grade fever.  Side effects typically go away within 2 to 3 days. Remember-the pain from shingles can last a lifetime but these side effects of the vaccine will only last a few days at most. It is very important to get both doses in order to protect yourself fully.   Please get this vaccine at your earliest convenience at your trusted pharmacy.      Return in 4 weeks for INR; continue same treatment

## 2019-07-24 LAB — LIPID PANEL
Chol/HDL Ratio: 4.3 ratio (ref 0.0–5.0)
Cholesterol, Total: 176 mg/dL (ref 100–199)
HDL: 41 mg/dL (ref >39)
LDL Chol Calc (NIH): 85 mg/dL (ref 0–99)
Triglycerides: 301 mg/dL — ABNORMAL HIGH (ref 0–149)
VLDL Cholesterol Cal: 50 mg/dL — ABNORMAL HIGH (ref 5–40)

## 2019-07-24 LAB — MICROALBUMIN / CREATININE URINE RATIO
Creatinine, Urine: 139.7 mg/dL
Microalb/Creat Ratio: 17 mg/g creat (ref 0–29)
Microalbumin, Urine: 24 ug/mL

## 2019-07-24 LAB — CBC WITH DIFFERENTIAL/PLATELET
Basophils Absolute: 0.1 10*3/uL (ref 0.0–0.2)
Basos: 1 %
EOS (ABSOLUTE): 0.2 10*3/uL (ref 0.0–0.4)
Eos: 3 %
Hematocrit: 50.5 % (ref 37.5–51.0)
Hemoglobin: 16.5 g/dL (ref 13.0–17.7)
Immature Grans (Abs): 0.1 10*3/uL (ref 0.0–0.1)
Immature Granulocytes: 1 %
Lymphocytes Absolute: 1.1 10*3/uL (ref 0.7–3.1)
Lymphs: 15 %
MCH: 30.2 pg (ref 26.6–33.0)
MCHC: 32.7 g/dL (ref 31.5–35.7)
MCV: 92 fL (ref 79–97)
Monocytes Absolute: 0.6 10*3/uL (ref 0.1–0.9)
Monocytes: 8 %
Neutrophils Absolute: 5.5 10*3/uL (ref 1.4–7.0)
Neutrophils: 72 %
Platelets: 201 10*3/uL (ref 150–450)
RBC: 5.47 x10E6/uL (ref 4.14–5.80)
RDW: 12.7 % (ref 11.6–15.4)
WBC: 7.6 10*3/uL (ref 3.4–10.8)

## 2019-07-24 LAB — BASIC METABOLIC PANEL
BUN/Creatinine Ratio: 16 (ref 10–24)
BUN: 24 mg/dL (ref 8–27)
CO2: 22 mmol/L (ref 20–29)
Calcium: 10 mg/dL (ref 8.6–10.2)
Chloride: 95 mmol/L — ABNORMAL LOW (ref 96–106)
Creatinine, Ser: 1.52 mg/dL — ABNORMAL HIGH (ref 0.76–1.27)
GFR calc Af Amer: 48 mL/min/{1.73_m2} — ABNORMAL LOW (ref >59)
GFR calc non Af Amer: 41 mL/min/{1.73_m2} — ABNORMAL LOW (ref >59)
Glucose: 290 mg/dL — ABNORMAL HIGH (ref 65–99)
Potassium: 4.3 mmol/L (ref 3.5–5.2)
Sodium: 135 mmol/L (ref 134–144)

## 2019-07-24 LAB — HEMOGLOBIN A1C
Est. average glucose Bld gHb Est-mCnc: 255 mg/dL
Hgb A1c MFr Bld: 10.5 % — ABNORMAL HIGH (ref 4.8–5.6)

## 2019-07-29 ENCOUNTER — Telehealth: Payer: Self-pay | Admitting: Family Medicine

## 2019-07-29 NOTE — Telephone Encounter (Signed)
Mr [name] [name] came by with a large bag of "pill packs" that he said [name] CMA had ordered online.  Called Relief Factor, supplements to help with pain.  He wanted to know if Dr. Arva Chafe thought this was ok for him to take with his current medications. I made a copy of the booklet that listed what was in the packets and drug info and put it in Dr. Bary Leriche box.

## 2019-07-31 ENCOUNTER — Other Ambulatory Visit: Payer: Self-pay | Admitting: Family Medicine

## 2019-08-04 NOTE — Telephone Encounter (Signed)
Nurses-please touch base with  [name] [name] [id number] personally I would not recommend the medication.  I am uncertain if it has any interactions with his current medicines.  As for the effectiveness I am skeptical.  I would not spend the money on this on a monthly basis.  I certainly am sympathetic with the fact that BA has so much health issues but I do not feel these medications will be helpful in the long run.

## 2019-08-05 NOTE — Telephone Encounter (Signed)
Left message to return call 

## 2019-08-07 NOTE — Telephone Encounter (Signed)
Pt son Richardson Landry contacted and verbalized understanding.

## 2019-08-09 DIAGNOSIS — E119 Type 2 diabetes mellitus without complications: Secondary | ICD-10-CM | POA: Diagnosis not present

## 2019-08-15 ENCOUNTER — Telehealth: Payer: Self-pay | Admitting: Family Medicine

## 2019-08-15 DIAGNOSIS — E1142 Type 2 diabetes mellitus with diabetic polyneuropathy: Secondary | ICD-10-CM

## 2019-08-15 MED ORDER — LANTUS SOLOSTAR 100 UNIT/ML ~~LOC~~ SOPN: PEN_INJECTOR | SUBCUTANEOUS | 11 refills | Status: DC

## 2019-08-15 NOTE — Telephone Encounter (Signed)
Discussed with [name] [facility]) [name] advised per Dr Nicki Reaper :   Increase to 26 units each evening Send [place] update within the next 7 to 14 days      [name] verbalized understanding and Medication dosage updated in [facility]

## 2019-08-15 NOTE — Telephone Encounter (Signed)
[name] [name] [phone], dropped off blood sugar log. (in basket)  She doesn't think the increase of insulin has really helped that much.  Said they were up to 22 units and wanted to know if you think they should up it some more?

## 2019-08-15 NOTE — Telephone Encounter (Signed)
Increase to 26 units each evening Send [place] update within the next 7 to 14 days Please make adjustment in the epic medical list to reflect that they are using 26 units thank you

## 2019-08-20 ENCOUNTER — Other Ambulatory Visit: Payer: Self-pay

## 2019-08-20 ENCOUNTER — Other Ambulatory Visit (INDEPENDENT_AMBULATORY_CARE_PROVIDER_SITE_OTHER): Payer: PPO | Admitting: *Deleted

## 2019-08-20 DIAGNOSIS — Z7901 Long term (current) use of anticoagulants: Secondary | ICD-10-CM | POA: Diagnosis not present

## 2019-08-20 LAB — POCT INR: INR: 1.9 — AB (ref 2.0–3.0)

## 2019-08-29 ENCOUNTER — Other Ambulatory Visit: Payer: Self-pay | Admitting: Family Medicine

## 2019-09-16 ENCOUNTER — Other Ambulatory Visit: Payer: Self-pay | Admitting: Family Medicine

## 2019-09-16 DIAGNOSIS — E119 Type 2 diabetes mellitus without complications: Secondary | ICD-10-CM | POA: Diagnosis not present

## 2019-09-16 DIAGNOSIS — E1142 Type 2 diabetes mellitus with diabetic polyneuropathy: Secondary | ICD-10-CM

## 2019-09-17 ENCOUNTER — Other Ambulatory Visit: Payer: PPO

## 2019-09-19 ENCOUNTER — Other Ambulatory Visit (INDEPENDENT_AMBULATORY_CARE_PROVIDER_SITE_OTHER): Payer: PPO | Admitting: *Deleted

## 2019-09-19 ENCOUNTER — Other Ambulatory Visit: Payer: Self-pay

## 2019-09-19 DIAGNOSIS — Z7901 Long term (current) use of anticoagulants: Secondary | ICD-10-CM

## 2019-09-19 LAB — POCT INR: INR: 2 (ref 2.0–3.0)

## 2019-10-22 ENCOUNTER — Other Ambulatory Visit: Payer: PPO

## 2019-10-23 ENCOUNTER — Other Ambulatory Visit: Payer: PPO

## 2019-10-24 ENCOUNTER — Other Ambulatory Visit: Payer: Self-pay

## 2019-10-24 ENCOUNTER — Other Ambulatory Visit: Payer: Self-pay | Admitting: Family Medicine

## 2019-10-24 ENCOUNTER — Other Ambulatory Visit (INDEPENDENT_AMBULATORY_CARE_PROVIDER_SITE_OTHER): Payer: PPO | Admitting: *Deleted

## 2019-10-24 DIAGNOSIS — Z7901 Long term (current) use of anticoagulants: Secondary | ICD-10-CM | POA: Diagnosis not present

## 2019-10-24 DIAGNOSIS — Z20822 Contact with and (suspected) exposure to covid-19: Secondary | ICD-10-CM

## 2019-10-24 LAB — POCT INR: INR: 2.2 (ref 2.0–3.0)

## 2019-10-26 LAB — NOVEL CORONAVIRUS, NAA: SARS-CoV-2, NAA: NOT DETECTED

## 2019-10-26 NOTE — Telephone Encounter (Signed)
Please talk with caretakers/family-explained the situation that we are receiving a request for the medicine and it is not currently on his med list  I would recommend discussing with caretakers to find out needs furosemide being used currently?  And verify how they are using it currently

## 2019-10-28 NOTE — Telephone Encounter (Signed)
Contacted [name] [name] (nephew); states if it is not in the pill pack he is not taking it. However, I contacted pharmacy and they state he has been filling it every month; but it is not in the pill pack , it is in a bottle. Last refill was on 10/08/2019. Please advise. Thank you

## 2019-10-28 NOTE — Telephone Encounter (Signed)
Go ahead and give him 4 refills

## 2019-11-04 DIAGNOSIS — E119 Type 2 diabetes mellitus without complications: Secondary | ICD-10-CM | POA: Diagnosis not present

## 2019-11-21 ENCOUNTER — Other Ambulatory Visit (INDEPENDENT_AMBULATORY_CARE_PROVIDER_SITE_OTHER): Payer: PPO | Admitting: *Deleted

## 2019-11-21 ENCOUNTER — Other Ambulatory Visit: Payer: Self-pay

## 2019-11-21 DIAGNOSIS — Z7901 Long term (current) use of anticoagulants: Secondary | ICD-10-CM

## 2019-11-21 LAB — POCT INR: INR: 1.4 — AB (ref 2.0–3.0)

## 2019-11-21 NOTE — Progress Notes (Signed)
Pt had a nurse visit for INR. Also dropped off sugar readings and dr scott review and said to continue the same treatment.

## 2019-12-03 ENCOUNTER — Telehealth: Payer: Self-pay | Admitting: Family Medicine

## 2019-12-03 NOTE — Telephone Encounter (Signed)
[name] [name] with [facility] calling reporting BA is having trouble with neuropathy and is wondering if gabapentin would help.   CB# [id number].   [place]

## 2019-12-03 NOTE — Telephone Encounter (Signed)
Although I certainly understand the request I believe it would be best for me to talk with the patient regarding this Patient has an appointment coming up on the 27th we will discuss further at that time thank you

## 2019-12-03 NOTE — Telephone Encounter (Signed)
Callback number for Valorie at [facility] is not a working number- Dr Nicki Reaper will address with patient at his follow up appt on 12/11/2019

## 2019-12-11 ENCOUNTER — Ambulatory Visit (INDEPENDENT_AMBULATORY_CARE_PROVIDER_SITE_OTHER): Payer: PPO | Admitting: Family Medicine

## 2019-12-11 DIAGNOSIS — M25571 Pain in right ankle and joints of right foot: Secondary | ICD-10-CM

## 2019-12-11 DIAGNOSIS — D692 Other nonthrombocytopenic purpura: Secondary | ICD-10-CM | POA: Diagnosis not present

## 2019-12-11 DIAGNOSIS — G8929 Other chronic pain: Secondary | ICD-10-CM | POA: Diagnosis not present

## 2019-12-11 DIAGNOSIS — N289 Disorder of kidney and ureter, unspecified: Secondary | ICD-10-CM

## 2019-12-11 DIAGNOSIS — Z7901 Long term (current) use of anticoagulants: Secondary | ICD-10-CM | POA: Diagnosis not present

## 2019-12-11 DIAGNOSIS — E0842 Diabetes mellitus due to underlying condition with diabetic polyneuropathy: Secondary | ICD-10-CM

## 2019-12-11 DIAGNOSIS — I5042 Chronic combined systolic (congestive) and diastolic (congestive) heart failure: Secondary | ICD-10-CM | POA: Diagnosis not present

## 2019-12-11 DIAGNOSIS — E1142 Type 2 diabetes mellitus with diabetic polyneuropathy: Secondary | ICD-10-CM | POA: Diagnosis not present

## 2019-12-11 DIAGNOSIS — I10 Essential (primary) hypertension: Secondary | ICD-10-CM

## 2019-12-11 DIAGNOSIS — E785 Hyperlipidemia, unspecified: Secondary | ICD-10-CM

## 2019-12-11 DIAGNOSIS — F09 Unspecified mental disorder due to known physiological condition: Secondary | ICD-10-CM | POA: Diagnosis not present

## 2019-12-11 DIAGNOSIS — I4811 Longstanding persistent atrial fibrillation: Secondary | ICD-10-CM | POA: Diagnosis not present

## 2019-12-11 DIAGNOSIS — E119 Type 2 diabetes mellitus without complications: Secondary | ICD-10-CM | POA: Diagnosis not present

## 2019-12-11 LAB — POCT INR: INR: 2.4 (ref 2.0–3.0)

## 2019-12-11 NOTE — Progress Notes (Signed)
Subjective:    Patient ID: [name] [name], male    DOB: Aug 05, 1932, 86 y.o.   MRN: [id number]  Diabetes He presents for his follow-up diabetic visit. He has type 2 diabetes mellitus. Pertinent negatives for hypoglycemia include no confusion, dizziness or headaches. Pertinent negatives for diabetes include no chest pain and no fatigue. Risk factors for coronary artery disease include diabetes mellitus, dyslipidemia and hypertension. Current diabetic treatment includes insulin injections and oral agent (monotherapy). He is compliant with treatment all of the time.   This patient sugars look under better control in the morning and higher later in the day morning time around 140-150 later in the day mid 200s no low sugar spells.  Currently using 28 units of insulin trying to be careful with eating Results for orders placed or performed in visit on 12/11/19  POCT INR  Result Value Ref Range   INR 2.4 2.0 - 3.0     Review of Systems  Constitutional: Negative for diaphoresis and fatigue.  HENT: Negative for congestion and rhinorrhea.   Respiratory: Negative for cough and shortness of breath.   Cardiovascular: Negative for chest pain and leg swelling.  Gastrointestinal: Negative for abdominal pain and diarrhea.  Musculoskeletal: Positive for arthralgias and back pain.  Skin: Negative for color change and rash.  Neurological: Negative for dizziness and headaches.  Psychiatric/Behavioral: Negative for behavioral problems and confusion.       Objective:   Physical Exam Vitals reviewed.  Constitutional:      General: He is not in acute distress. HENT:     Head: Normocephalic and atraumatic.  Eyes:     General:        Right eye: No discharge.        Left eye: No discharge.  Neck:     Trachea: No tracheal deviation.  Cardiovascular:     Rate and Rhythm: Normal rate. Rhythm irregular.     Heart sounds: Normal heart sounds. No murmur.  Pulmonary:     Effort: Pulmonary effort is normal.  No respiratory distress.     Breath sounds: Normal breath sounds.  Lymphadenopathy:     Cervical: No cervical adenopathy.  Skin:    General: Skin is warm and dry.  Neurological:     Mental Status: He is alert.     Coordination: Coordination normal.  Psychiatric:        Behavior: Behavior normal.   Trace pedal edema   30 minutes spent with patient     Assessment & Plan:  1. Long term (current) use of anticoagulants INR looks good continue medication as is recheck in 4 weeks - POCT INR  2. Essential hypertension Blood pressure has been stable at home no changes recommended currently be careful about salt  3. Longstanding persistent atrial fibrillation (HCC) Heart rate is controlled tolerating anticoagulant  4. Chronic combined systolic and diastolic congestive heart failure ([place]) Does get a little bit out of breath when he pushes himself but by enlarge is not in any overt CHF  5. Senile purpura (HCC) Senile purpura on the arms but not severe  6. Diabetic polyneuropathy associated with diabetes mellitus due to underlying condition [facility] Patient has diabetes he will do lab work by March he will increase insulin new amount 30 units each evening he will let us know if any progressive troubles or problems send [place] readings in several weeks  7. Renal insufficiency Will recheck kidney function by spring  8. Cognitive dysfunction Patient staying stable with his  cognitive's skills  9. Hyperlipidemia, unspecified hyperlipidemia type Takes his medication regular basis  10. Chronic pain of right ankle Has chronic pain from previous fracture recommend Tylenol

## 2019-12-12 MED ORDER — LANTUS SOLOSTAR 100 UNIT/ML ~~LOC~~ SOPN: PEN_INJECTOR | SUBCUTANEOUS | 11 refills | Status: DC

## 2019-12-12 NOTE — Addendum Note (Signed)
Addended by: Vicente Males on: 12/12/2019 09:18 AM   Modules accepted: Orders

## 2019-12-12 NOTE — Progress Notes (Signed)
Lab orders placed and insulin dose updated. Lab orders mailed to patient

## 2019-12-13 ENCOUNTER — Other Ambulatory Visit: Payer: Self-pay | Admitting: Family Medicine

## 2019-12-24 ENCOUNTER — Ambulatory Visit: Payer: PPO | Admitting: Family Medicine

## 2020-01-13 DIAGNOSIS — E119 Type 2 diabetes mellitus without complications: Secondary | ICD-10-CM | POA: Diagnosis not present

## 2020-01-24 ENCOUNTER — Other Ambulatory Visit: Payer: Self-pay | Admitting: Family Medicine

## 2020-01-24 DIAGNOSIS — E1142 Type 2 diabetes mellitus with diabetic polyneuropathy: Secondary | ICD-10-CM | POA: Diagnosis not present

## 2020-01-24 DIAGNOSIS — Z7901 Long term (current) use of anticoagulants: Secondary | ICD-10-CM | POA: Diagnosis not present

## 2020-01-24 DIAGNOSIS — E785 Hyperlipidemia, unspecified: Secondary | ICD-10-CM | POA: Diagnosis not present

## 2020-01-25 LAB — LIPID PANEL W/O CHOL/HDL RATIO
Cholesterol, Total: 137 mg/dL (ref 100–199)
HDL: 37 mg/dL — ABNORMAL LOW (ref >39)
LDL Chol Calc (NIH): 74 mg/dL (ref 0–99)
Triglycerides: 147 mg/dL (ref 0–149)
VLDL Cholesterol Cal: 26 mg/dL (ref 5–40)

## 2020-01-25 LAB — CBC WITH DIFFERENTIAL/PLATELET
Basophils Absolute: 0.1 10*3/uL (ref 0.0–0.2)
Basos: 1 %
EOS (ABSOLUTE): 0.4 10*3/uL (ref 0.0–0.4)
Eos: 7 %
Hematocrit: 43.2 % (ref 37.5–51.0)
Hemoglobin: 14.3 g/dL (ref 13.0–17.7)
Immature Grans (Abs): 0.1 10*3/uL (ref 0.0–0.1)
Immature Granulocytes: 1 %
Lymphocytes Absolute: 0.9 10*3/uL (ref 0.7–3.1)
Lymphs: 15 %
MCH: 30 pg (ref 26.6–33.0)
MCHC: 33.1 g/dL (ref 31.5–35.7)
MCV: 91 fL (ref 79–97)
Monocytes Absolute: 0.5 10*3/uL (ref 0.1–0.9)
Monocytes: 8 %
Neutrophils Absolute: 4.1 10*3/uL (ref 1.4–7.0)
Neutrophils: 68 %
Platelets: 189 10*3/uL (ref 150–450)
RBC: 4.76 x10E6/uL (ref 4.14–5.80)
RDW: 12.7 % (ref 11.6–15.4)
WBC: 6.1 10*3/uL (ref 3.4–10.8)

## 2020-01-25 LAB — BASIC METABOLIC PANEL
BUN/Creatinine Ratio: 14 (ref 10–24)
BUN: 17 mg/dL (ref 8–27)
CO2: 24 mmol/L (ref 20–29)
Calcium: 9.5 mg/dL (ref 8.6–10.2)
Chloride: 99 mmol/L (ref 96–106)
Creatinine, Ser: 1.25 mg/dL (ref 0.76–1.27)
GFR calc Af Amer: 60 mL/min/{1.73_m2} (ref >59)
GFR calc non Af Amer: 52 mL/min/{1.73_m2} — ABNORMAL LOW (ref >59)
Glucose: 196 mg/dL — ABNORMAL HIGH (ref 65–99)
Potassium: 4.9 mmol/L (ref 3.5–5.2)
Sodium: 137 mmol/L (ref 134–144)

## 2020-01-25 LAB — HGB A1C W/O EAG: Hgb A1c MFr Bld: 9.8 % — ABNORMAL HIGH (ref 4.8–5.6)

## 2020-01-25 LAB — HEPATIC FUNCTION PANEL
ALT: 23 IU/L (ref 0–44)
AST: 17 IU/L (ref 0–40)
Albumin: 4.2 g/dL (ref 3.6–4.6)
Alkaline Phosphatase: 89 IU/L (ref 39–117)
Bilirubin Total: 0.6 mg/dL (ref 0.0–1.2)
Bilirubin, Direct: 0.23 mg/dL (ref 0.00–0.40)
Total Protein: 6.7 g/dL (ref 6.0–8.5)

## 2020-01-29 ENCOUNTER — Other Ambulatory Visit: Payer: Self-pay | Admitting: Family Medicine

## 2020-02-03 ENCOUNTER — Ambulatory Visit (INDEPENDENT_AMBULATORY_CARE_PROVIDER_SITE_OTHER): Payer: PPO | Admitting: Family Medicine

## 2020-02-03 ENCOUNTER — Other Ambulatory Visit: Payer: Self-pay

## 2020-02-03 ENCOUNTER — Telehealth: Payer: Self-pay | Admitting: *Deleted

## 2020-02-03 VITALS — BP 110/74 | Ht 73.0 in | Wt 240.0 lb

## 2020-02-03 DIAGNOSIS — R0600 Dyspnea, unspecified: Secondary | ICD-10-CM

## 2020-02-03 DIAGNOSIS — I350 Nonrheumatic aortic (valve) stenosis: Secondary | ICD-10-CM | POA: Diagnosis not present

## 2020-02-03 DIAGNOSIS — E1142 Type 2 diabetes mellitus with diabetic polyneuropathy: Secondary | ICD-10-CM

## 2020-02-03 DIAGNOSIS — I10 Essential (primary) hypertension: Secondary | ICD-10-CM

## 2020-02-03 DIAGNOSIS — M25571 Pain in right ankle and joints of right foot: Secondary | ICD-10-CM | POA: Diagnosis not present

## 2020-02-03 DIAGNOSIS — D692 Other nonthrombocytopenic purpura: Secondary | ICD-10-CM | POA: Diagnosis not present

## 2020-02-03 DIAGNOSIS — I4811 Longstanding persistent atrial fibrillation: Secondary | ICD-10-CM

## 2020-02-03 DIAGNOSIS — R0609 Other forms of dyspnea: Secondary | ICD-10-CM

## 2020-02-03 DIAGNOSIS — E785 Hyperlipidemia, unspecified: Secondary | ICD-10-CM | POA: Diagnosis not present

## 2020-02-03 DIAGNOSIS — M545 Low back pain, unspecified: Secondary | ICD-10-CM

## 2020-02-03 DIAGNOSIS — I5042 Chronic combined systolic (congestive) and diastolic (congestive) heart failure: Secondary | ICD-10-CM | POA: Diagnosis not present

## 2020-02-03 DIAGNOSIS — Z7901 Long term (current) use of anticoagulants: Secondary | ICD-10-CM

## 2020-02-03 DIAGNOSIS — G8929 Other chronic pain: Secondary | ICD-10-CM

## 2020-02-03 DIAGNOSIS — E0842 Diabetes mellitus due to underlying condition with diabetic polyneuropathy: Secondary | ICD-10-CM

## 2020-02-03 LAB — POCT INR: INR: 2.5 (ref 2.0–3.0)

## 2020-02-03 MED ORDER — LANTUS SOLOSTAR 100 UNIT/ML ~~LOC~~ SOPN: PEN_INJECTOR | SUBCUTANEOUS | 5 refills | Status: DC

## 2020-02-03 MED ORDER — ZOSTER VAC RECOMB ADJUVANTED 50 MCG/0.5ML IM SUSR: 0.5000 mL | Freq: Once | INTRAMUSCULAR | 1 refills | Status: DC

## 2020-02-03 NOTE — Telephone Encounter (Signed)
ECHO order put in and I think it needs a precert and does not drop in referral workQ. Call [name] or steve with appt infor instead of the pt. He can go anyday but prefers afternoon.

## 2020-02-03 NOTE — Progress Notes (Signed)
Subjective:    Patient ID: [name] [name], male    DOB: [birth year], 86 y.o.   MRN: [id number]  Diabetes He presents for his follow-up diabetic visit. He has type 2 diabetes mellitus. Pertinent negatives for hypoglycemia include no confusion, dizziness or headaches. Pertinent negatives for diabetes include no chest pain and no fatigue. He is compliant with treatment all of the time. Home blood sugar record trend: brought in blood sugar readings. Eye exam is not current (about 2 years ago. canceled due to covid).   INR today. 2.5 No bleeding issues. Back pain for 5 -6 years.  Relates the pain is more when he is moving about he spends most of his time laying down or sitting down denies any burning down the legs denies any weakness  Gives out of breath walking.  Does get out of breath with moving around short of breath breathing heavy denies PND denies orthopnea denies severe swelling in the legs.  Dizziness. Started a few months ago.  Finds he gets dizzy when he stands up and moves around.  Not passing out.  No other major issues.  First three fingers on left hand keep going numb.  Intermittent numbness but no weakness.  Results for orders placed or performed in visit on 01/24/20  CBC with Differential/Platelet  Result Value Ref Range   WBC 6.1 3.4 - 10.8 x10E3/uL   RBC 4.76 4.14 - 5.80 x10E6/uL   Hemoglobin 14.3 13.0 - 17.7 g/dL   Hematocrit 43.2 37.5 - 51.0 %   MCV 91 79 - 97 fL   MCH 30.0 26.6 - 33.0 pg   MCHC 33.1 31.5 - 35.7 g/dL   RDW 12.7 11.6 - 15.4 %   Platelets 189 150 - 450 x10E3/uL   Neutrophils 68 Not Estab. %   Lymphs 15 Not Estab. %   Monocytes 8 Not Estab. %   Eos 7 Not Estab. %   Basos 1 Not Estab. %   Neutrophils Absolute 4.1 1.4 - 7.0 x10E3/uL   Lymphocytes Absolute 0.9 0.7 - 3.1 x10E3/uL   Monocytes Absolute 0.5 0.1 - 0.9 x10E3/uL   EOS (ABSOLUTE) 0.4 0.0 - 0.4 x10E3/uL   Basophils Absolute 0.1 0.0 - 0.2 x10E3/uL   Immature Granulocytes 1 Not Estab. %   Immature Grans (Abs) 0.1 0.0 - 0.1 [id number]  Basic metabolic panel  Result Value Ref Range   Glucose 196 (H) 65 - 99 mg/dL   BUN 17 8 - 27 mg/dL   Creatinine, Ser 1.25 0.76 - 1.27 mg/dL   GFR calc non Af Amer 52 (L) >59 mL/min/1.73   GFR calc Af Amer 60 >59 mL/min/1.73   BUN/Creatinine Ratio 14 10 - 24   Sodium 137 134 - 144 mmol/L   Potassium 4.9 3.5 - 5.2 mmol/L   Chloride 99 96 - 106 mmol/L   CO2 24 20 - 29 mmol/L   Calcium 9.5 8.6 - 10.2 mg/dL  Lipid Panel w/o Chol/HDL Ratio  Result Value Ref Range   Cholesterol, Total 137 100 - 199 mg/dL   Triglycerides 147 0 - 149 mg/dL   HDL 37 (L) >39 mg/dL   VLDL Cholesterol Cal 26 5 - 40 mg/dL   LDL Chol Calc (NIH) 74 0 - 99 mg/dL  Hepatic function panel  Result Value Ref Range   Total Protein 6.7 6.0 - 8.5 g/dL   Albumin 4.2 3.6 - 4.6 g/dL   Bilirubin Total 0.6 0.0 - 1.2 mg/dL   Bilirubin, Direct 0.23 0.00 -  0.40 mg/dL   Alkaline Phosphatase 89 39 - 117 IU/L   AST 17 0 - 40 IU/L   ALT 23 0 - 44 IU/L  Hgb A1c w/o eAG  Result Value Ref Range   Hgb A1c MFr Bld 9.8 (H) 4.8 - 5.6 %   Labs were reviewed with the patient including diabetes not been under good control   Review of Systems  Constitutional: Negative for diaphoresis and fatigue.  HENT: Negative for congestion and rhinorrhea.   Respiratory: Negative for cough and shortness of breath.   Cardiovascular: Negative for chest pain and leg swelling.  Gastrointestinal: Negative for abdominal pain and diarrhea.  Musculoskeletal: Positive for arthralgias and back pain.  Skin: Negative for color change and rash.  Neurological: Negative for dizziness and headaches.  Psychiatric/Behavioral: Negative for behavioral problems and confusion.       Objective:   Physical Exam Vitals reviewed.  Constitutional:      General: He is not in acute distress. HENT:     Head: Normocephalic and atraumatic.  Eyes:     General:        Right eye: No discharge.        Left eye: No discharge.   Neck:     Trachea: No tracheal deviation.  Cardiovascular:     Rate and Rhythm: Normal rate.     Heart sounds: Normal heart sounds. No murmur.  Pulmonary:     Effort: Pulmonary effort is normal. No respiratory distress.     Breath sounds: Normal breath sounds.  Lymphadenopathy:     Cervical: No cervical adenopathy.  Skin:    General: Skin is warm and dry.  Neurological:     Mental Status: He is alert.     Coordination: Coordination normal.  Psychiatric:        Behavior: Behavior normal.    Significant venous insufficiency in the feet  Senile purpura noted     Assessment & Plan:  1. Essential hypertension Blood pressure on the low normal range puts him at risk for having low blood pressure cautions were discussed how to minimize that minimize risk of falling  2. Longstanding persistent atrial fibrillation (HCC) Atrial fib under good control currently continue current medicines  3. Aortic valve stenosis, etiology of cardiac valve disease unspecified We will go ahead with repeating echo.  Need to make sure that patient does not have progression of his CHF  4. Chronic combined systolic and diastolic congestive heart failure ([place]) Will look at systolic function shortness of breath probably related into heart related issues and deconditioning echo recommended  5. Senile purpura (HCC) Senile purpura issues no bleeding issues continue current meds  6. Type 2 diabetes mellitus with diabetic polyneuropathy, without long-term current use of insulin (HCC) Diabetes poor control watch sugars closely I reviewed over the numbers recently and they look good patient will do a better job of food choices continuing the insulin and recheck the A1c again in 3 to 4 months - insulin glargine (LANTUS SOLOSTAR) 100 UNIT/ML Solostar Pen; INJECT 32 UNITS EVERY EVENING.  Dispense: 15 mL; Refill: 5  7. Diabetic polyneuropathy associated with diabetes mellitus due to underlying condition  ([place]) Neuropathy I do not want to add any narcotics to the patient's regimen because her risk of falling  8. Hyperlipidemia, unspecified hyperlipidemia type Hyperlipidemia good control continue current measures  9. Chronic pain of right ankle Tylenol as needed has had previous fracture  10. Lumbar pain No red flags stretching exercises Tylenol as needed  follow-up if ongoing troubles  11. DOE (dyspnea on exertion) Significant DOE do chest x-ray echo await the results of this may need cardiac further evaluation continue current measures - ECHOCARDIOGRAM COMPLETE - DG Chest 2 View  12. Encounter for current long-term use of anticoagulants INR looks good continue current measures  30 minutes spent with patient multiple health issues several of them getting worse and not under good control have patient follow-up office visit 3 months - POCT INR

## 2020-02-05 NOTE — Telephone Encounter (Signed)
No PA required, someone else scheduled before I could

## 2020-02-11 DIAGNOSIS — X32XXXD Exposure to sunlight, subsequent encounter: Secondary | ICD-10-CM | POA: Diagnosis not present

## 2020-02-11 DIAGNOSIS — C44311 Basal cell carcinoma of skin of nose: Secondary | ICD-10-CM | POA: Diagnosis not present

## 2020-02-11 DIAGNOSIS — B9689 Other specified bacterial agents as the cause of diseases classified elsewhere: Secondary | ICD-10-CM | POA: Diagnosis not present

## 2020-02-11 DIAGNOSIS — L570 Actinic keratosis: Secondary | ICD-10-CM | POA: Diagnosis not present

## 2020-02-11 DIAGNOSIS — L0202 Furuncle of face: Secondary | ICD-10-CM | POA: Diagnosis not present

## 2020-02-12 ENCOUNTER — Telehealth: Payer: Self-pay | Admitting: Family Medicine

## 2020-02-12 NOTE — Telephone Encounter (Signed)
Contacted niece. No bruising or swelling. Hurts under breast/rib area. Niece would like a xray order. Breathing ok. Pt is doing ok today. Pt has had appt for a chest xray on Friday and niece states since they are already there can they have an order. Please advise. Thank you

## 2020-02-12 NOTE — Telephone Encounter (Signed)
Niece called stating that he fell yesterday and is complaining of left side pain.  Declined appt, declined urgent care and ER.  Michela Pitcher he has an appt on Friday to have a chest xray and she just wanted [place] to "add to that order" to have an xray of his side.

## 2020-02-13 ENCOUNTER — Emergency Department (HOSPITAL_COMMUNITY): Payer: PPO

## 2020-02-13 ENCOUNTER — Other Ambulatory Visit: Payer: Self-pay

## 2020-02-13 ENCOUNTER — Telehealth: Payer: Self-pay | Admitting: Family Medicine

## 2020-02-13 ENCOUNTER — Encounter (HOSPITAL_COMMUNITY): Payer: Self-pay | Admitting: Emergency Medicine

## 2020-02-13 ENCOUNTER — Emergency Department (HOSPITAL_COMMUNITY)
Admission: EM | Admit: 2020-02-13 | Discharge: 2020-02-13 | Disposition: A | Discharge status: Home / Self Care | Payer: PPO | Attending: Emergency Medicine | Admitting: Emergency Medicine

## 2020-02-13 DIAGNOSIS — I5022 Chronic systolic (congestive) heart failure: Secondary | ICD-10-CM | POA: Insufficient documentation

## 2020-02-13 DIAGNOSIS — E1165 Type 2 diabetes mellitus with hyperglycemia: Secondary | ICD-10-CM | POA: Diagnosis not present

## 2020-02-13 DIAGNOSIS — Y9301 Activity, walking, marching and hiking: Secondary | ICD-10-CM | POA: Diagnosis not present

## 2020-02-13 DIAGNOSIS — N182 Chronic kidney disease, stage 2 (mild): Secondary | ICD-10-CM | POA: Insufficient documentation

## 2020-02-13 DIAGNOSIS — Z87891 Personal history of nicotine dependence: Secondary | ICD-10-CM | POA: Diagnosis not present

## 2020-02-13 DIAGNOSIS — S299XXA Unspecified injury of thorax, initial encounter: Secondary | ICD-10-CM | POA: Insufficient documentation

## 2020-02-13 DIAGNOSIS — Z79899 Other long term (current) drug therapy: Secondary | ICD-10-CM | POA: Insufficient documentation

## 2020-02-13 DIAGNOSIS — I130 Hypertensive heart and chronic kidney disease with heart failure and stage 1 through stage 4 chronic kidney disease, or unspecified chronic kidney disease: Secondary | ICD-10-CM | POA: Diagnosis not present

## 2020-02-13 DIAGNOSIS — W010XXA Fall on same level from slipping, tripping and stumbling without subsequent striking against object, initial encounter: Secondary | ICD-10-CM | POA: Insufficient documentation

## 2020-02-13 DIAGNOSIS — Y999 Unspecified external cause status: Secondary | ICD-10-CM | POA: Diagnosis not present

## 2020-02-13 DIAGNOSIS — R739 Hyperglycemia, unspecified: Secondary | ICD-10-CM

## 2020-02-13 DIAGNOSIS — Y929 Unspecified place or not applicable: Secondary | ICD-10-CM | POA: Diagnosis not present

## 2020-02-13 DIAGNOSIS — J45909 Unspecified asthma, uncomplicated: Secondary | ICD-10-CM | POA: Insufficient documentation

## 2020-02-13 DIAGNOSIS — W19XXXA Unspecified fall, initial encounter: Secondary | ICD-10-CM

## 2020-02-13 DIAGNOSIS — Z7984 Long term (current) use of oral hypoglycemic drugs: Secondary | ICD-10-CM | POA: Insufficient documentation

## 2020-02-13 LAB — URINALYSIS, ROUTINE W REFLEX MICROSCOPIC
Bacteria, UA: NONE SEEN
Bilirubin Urine: NEGATIVE
Glucose, UA: >=500 mg/dL — AB
Ketones, ur: NEGATIVE mg/dL
Leukocytes,Ua: NEGATIVE
Nitrite: NEGATIVE
Protein, ur: NEGATIVE mg/dL
Specific Gravity, Urine: 1.021 (ref 1.005–1.030)
pH: 5 (ref 5.0–8.0)

## 2020-02-13 LAB — CBC WITH DIFFERENTIAL/PLATELET
Abs Immature Granulocytes: 0.07 10*3/uL (ref 0.00–0.07)
Basophils Absolute: 0.1 10*3/uL (ref 0.0–0.1)
Basophils Relative: 1 %
Eosinophils Absolute: 0.3 10*3/uL (ref 0.0–0.5)
Eosinophils Relative: 4 %
HCT: 44 % (ref 39.0–52.0)
Hemoglobin: 14.3 g/dL (ref 13.0–17.0)
Immature Granulocytes: 1 %
Lymphocytes Relative: 10 %
Lymphs Abs: 0.8 10*3/uL (ref 0.7–4.0)
MCH: 29.4 pg (ref 26.0–34.0)
MCHC: 32.5 g/dL (ref 30.0–36.0)
MCV: 90.3 fL (ref 80.0–100.0)
Monocytes Absolute: 0.5 10*3/uL (ref 0.1–1.0)
Monocytes Relative: 6 %
Neutro Abs: 6.1 10*3/uL (ref 1.7–7.7)
Neutrophils Relative %: 78 %
Platelets: 197 10*3/uL (ref 150–400)
RBC: 4.87 MIL/uL (ref 4.22–5.81)
RDW: 13.4 % (ref 11.5–15.5)
WBC: 7.8 10*3/uL (ref 4.0–10.5)
nRBC: 0 % (ref 0.0–0.2)

## 2020-02-13 LAB — PROTIME-INR
INR: 2.3 — ABNORMAL HIGH (ref 0.8–1.2)
Prothrombin Time: 25.3 seconds — ABNORMAL HIGH (ref 11.4–15.2)

## 2020-02-13 LAB — BASIC METABOLIC PANEL
Anion gap: 12 (ref 5–15)
BUN: 23 mg/dL (ref 8–23)
CO2: 21 mmol/L — ABNORMAL LOW (ref 22–32)
Calcium: 9 mg/dL (ref 8.9–10.3)
Chloride: 97 mmol/L — ABNORMAL LOW (ref 98–111)
Creatinine, Ser: 1.38 mg/dL — ABNORMAL HIGH (ref 0.61–1.24)
GFR calc Af Amer: 54 mL/min — ABNORMAL LOW (ref >60)
GFR calc non Af Amer: 46 mL/min — ABNORMAL LOW (ref >60)
Glucose, Bld: 413 mg/dL — ABNORMAL HIGH (ref 70–99)
Potassium: 4.4 mmol/L (ref 3.5–5.1)
Sodium: 130 mmol/L — ABNORMAL LOW (ref 135–145)

## 2020-02-13 NOTE — Telephone Encounter (Signed)
Spoke with niece earlier and advised them that if we could be of any help to let us know. Niece verbalized understanding.

## 2020-02-13 NOTE — Telephone Encounter (Signed)
Please advise. Thank you

## 2020-02-13 NOTE — Telephone Encounter (Signed)
[name] [name] checking on message from yesterday

## 2020-02-13 NOTE — Telephone Encounter (Signed)
Contact pt niece [name]. Pt not having any abdominal pain per neice. Niece states that they are currently at the ER due to patient wanting to go.

## 2020-02-13 NOTE — Telephone Encounter (Signed)
So noted if we can be of help let us know

## 2020-02-13 NOTE — Telephone Encounter (Signed)
So if it is in the rib region-such as pain and discomfort in the ribs you can add left ribs make the order stat then they can do Friday morning Nurses-please flag this so results are followed up on Friday and discussed with the doctor on for the day I will be out on Friday If the pain is in the lower abdomen or other areas I would need to know about more that more details

## 2020-02-13 NOTE — ED Provider Notes (Signed)
[facility] EMERGENCY DEPARTMENT Provider Note   CSN: [id number] Arrival date & time: 02/13/20  0623     History Chief Complaint  Patient presents with  . Rib Injury  . Fall    [name] [name] is a 86 y.o. male presents emergency department with chief complaint of left-sided rib pain.  Had a fall 2 days ago while walking in his walker.  He states that he has weak legs from his back and that his legs gave out and he fell onto his left chest wall.  He denies hitting his head or losing consciousness.  Patient was concerned that he had something wrong with his heart because he had severe left anterior chest pain which was worse with deep breathing.  He has persistent deep breathing pain.  He feels better when he is sitting still.  He denies increased shortness of breath, fever, chills, abdominal pain, back pain, hematuria, hemoptysis.  Patient is on Coumadin for A. fib and valve replacement.  HPI     Past Medical History:  Diagnosis Date  . Aortic stenosis    a. mod-sev by echo 05/2016.  . Asthma   . Cerebrovascular disease 07/2010   TIA; carotid ultrasound in 07/2010-significant bilateral plaque without focal internal carotid artery stenosis; MRI -encephalomalacia left temporal and right temporal lobes; small inferior right cerebellar infarct; small vessel disease  . Chronic atrial fibrillation (HCC)    Paroxysmal; Echocardiogram in 2007-normal EF; mild LVH; left atrial enlargement; mild stenosis and minimal AI; negative stress nuclear study in 2008  . Chronic systolic CHF (congestive heart failure) ([place])    a. dx 05/2016 - EF 35-40%, diffuse HK, mod-severe AS, mod gradient, severe AVA VTI likely due to decreased cardiac output in setting of systolic dysfsunction and significant mitral regurgitation, mild MR, mod-severe MR, severe LAE, mild-mod RV dilation, mild RAE, mild-mod TR, mod PASP 33mHg.  . CKD (chronic kidney disease), stage II   . Degenerative joint disease    feet and legs  .  Diabetes mellitus    no insulin; A1c of 6.6 in 2005  . Dizziness    occurs daily,especially in am  . Exertional dyspnea   . Gastroesophageal reflux disease   . Hepatic steatosis   . History of noncompliance with medical treatment   . Hyperlipidemia    adverse reactions to statins and niacin  . Hypertension    Borderline  . Irregular heartbeat   . Mitral regurgitation    a. mod-sev by echo 05/2016  . Peripheral vascular disease ([place]   . Renal insufficiency   . S/P TAVR (transcatheter aortic valve replacement) 07/19/2016   29 mm Edwards Sapien 3 transcatheter heart valve placed via left percutaneous transfemoral approach  . Temporal arteritis ([place]   . Tricuspid regurgitation    a. mild-mod TR by echo 05/2016    Patient Active Problem List   Diagnosis Date Noted  . Senile purpura ([facility] 08/15/2018  . Renal insufficiency 03/14/2018  . Altered mental status 02/21/2017  . Congestive heart failure (CHF) ([facility] 02/09/2017  . Acute on chronic systolic CHF (congestive heart failure) ([facility] 02/09/2017  . Controlled type 2 diabetes mellitus with hyperglycemia ([facility] 02/09/2017  . Elevated troponin   . S/P TAVR (transcatheter aortic valve replacement) 07/19/2016  . Mitral regurgitation   . VT (ventricular tachycardia) ([place]   . Cardiogenic shock ([facility]   . Acute respiratory failure with hypoxia ([facility]   . Dyspnea 06/28/2016  . CAD (coronary artery disease), native coronary artery 06/28/2016  .  Acute on chronic combined systolic and diastolic CHF, NYHA class 4 ([place])   . Anemia of chronic disease 04/12/2016  . Orthostatic hypotension 08/20/2015  . AKI (acute kidney injury) ([place]) 08/20/2015  . Asthma exacerbation 08/20/2015  . Cognitive dysfunction 05/29/2015  . Chronic back pain 07/29/2014  . Lumbar pain 06/23/2014  . Chronic pain of right ankle 05/30/2014  . Diabetic neuropathy ([place]) 05/30/2014  . Chest congestion 05/02/2013  . Wheezing 05/02/2013  . Unspecified constipation 04/30/2013    . Leg edema, left 04/27/2013  . Carotid stenosis 04/25/2012  . BPH (benign prostatic hypertrophy) with urinary obstruction 09/27/2011  . Dyspnea on exertion 08/01/2011  . Diabetes mellitus ([place]) 07/14/2011  . Hypertension   . Atrial fibrillation ([place])   . Hyperlipidemia   . History of noncompliance with medical treatment   . Aortic stenosis   . Cerebrovascular disease 07/15/2010    Past Surgical History:  Procedure Laterality Date  . COLONOSCOPY  2002  . COLONOSCOPY  01/19/2012   Procedure: COLONOSCOPY;  Surgeon: Rogene Houston, MD;  [facility];  Service: Endoscopy;  Laterality: N/A;  1030  . LIPOMA EXCISION  1980  . ORIF ANKLE FRACTURE  2000   Right  . PERIPHERAL VASCULAR CATHETERIZATION N/A 07/11/2016   Procedure: Carotid PTA/Stent Intervention;  Surgeon: Lorretta Harp, MD;  Location: [place] CV LAB;  Service: Cardiovascular;  Laterality: N/A;  . PROSTATE SURGERY  12/2011  . ROTATOR CUFF REPAIR     Right  . TEE WITHOUT CARDIOVERSION N/A 06/17/2016   Procedure: TRANSESOPHAGEAL ECHOCARDIOGRAM (TEE);  Surgeon: Jerline Pain, MD;  Location: [place];  Service: Cardiovascular;  Laterality: N/A;  . TEE WITHOUT CARDIOVERSION N/A 07/19/2016   Procedure: TRANSESOPHAGEAL ECHOCARDIOGRAM (TEE);  Surgeon: Sherren Mocha, MD;  Location: [place];  Service: Open Heart Surgery;  Laterality: N/A;  . TRANSCATHETER AORTIC VALVE REPLACEMENT, TRANSFEMORAL N/A 07/19/2016   Procedure: TRANSCATHETER AORTIC VALVE REPLACEMENT, TRANSFEMORAL;  Surgeon: Sherren Mocha, MD;  Location: [place];  Service: Open Heart Surgery;  Laterality: N/A;  . TRANSURETHRAL RESECTION OF PROSTATE  09/2011  . URETHRAL STRICTURE DILATATION  1980s       Family History  Problem Relation Age of Onset  . Stroke Mother   . Diabetes Father   . Colon cancer Neg Hx     Social History   Tobacco Use  . Smoking status: Former Smoker    Packs/day: 1.00    Years: 20.00    Pack years: 20.00    Types: Cigarettes     Start date: 07/04/1950    Quit date: 04/25/1992    Years since quitting: 27.8  . Smokeless tobacco: Current User    Types: Chew  Substance Use Topics  . Alcohol use: No    Alcohol/week: 0.0 standard drinks  . Drug use: No    Home Medications Prior to Admission medications   Medication Sig Start Date End Date Taking? Authorizing Provider  albuterol (PROVENTIL) (2.5 MG/3ML) 0.083% nebulizer solution Take 3 mLs (2.5 mg total) by nebulization every 6 (six) hours as needed for wheezing or shortness of breath. 11/03/17   Mikey Kirschner, MD  bisoprolol (ZEBETA) 5 MG tablet TAKE (1/2) TABLET BY MOUTH ONCE DAILY. 08/29/19   Kathyrn Drown, MD  blood glucose meter kit and supplies Dispense based on patient and insurance preference. Use to test glucose three times daily.  (FOR ICD-10) 12/31/18   Kathyrn Drown, MD  cholecalciferol (VITAMIN D) 1000 units tablet Take 1,000 Units by  mouth daily.    [provider]  fluticasone (FLONASE) 50 MCG/ACT nasal spray 2 SPRAYS EACH NOSTRIL ONCE DAILY. 02/04/19   Kathyrn Drown, MD  furosemide (LASIX) 20 MG tablet TAKE 1 TABLET BY MOUTH ON MONDAY, WEDNESDAY AND FRIDAY. 10/29/19   Kathyrn Drown, MD  hydrOXYzine (ATARAX/VISTARIL) 25 MG tablet Take 1 tablet (25 mg total) by mouth every 6 (six) hours as needed. Stop benadryl 02/09/18   Kathyrn Drown, MD  insulin glargine (LANTUS SOLOSTAR) 100 UNIT/ML Solostar Pen INJECT 32 UNITS EVERY EVENING. 02/03/20   Kathyrn Drown, MD  ketoconazole (NIZORAL) 2 % cream Apply 1 application topically 2 (two) times daily. 05/07/19   Kathyrn Drown, MD  metFORMIN (GLUCOPHAGE-XR) 500 MG 24 hr tablet TAKE 1 TABLET BY MOUTH ONCE DAILY WITH BREAKFAST. 12/13/19   Kathyrn Drown, MD  nortriptyline (PAMELOR) 10 MG capsule TAKE 1 OR 2 CAPSULES AT BEDTIME FOR BURNING IN FEET.[28 IN PACKS/ NONE IN BOTTLE] 05/11/19   Kathyrn Drown, MD  potassium chloride SA (KLOR-CON) 20 MEQ tablet TAKE 1 TABLET BY MOUTH ON MONDAY, WEDNESDAY AND  FRIDAY. 01/29/20   Kathyrn Drown, MD  rosuvastatin (CRESTOR) 5 MG tablet TAKE (1) TABLET BY MOUTH ONCE DAILY AT BEDTIME. 01/29/20   Luking, Elayne Snare, MD  TRUEPLUS PEN NEEDLES 31G X 8 MM MISC USE WITH LANTUS SOLOSTAR EVERY EVENING 06/24/19   Kathyrn Drown, MD  warfarin (COUMADIN) 6 MG tablet TAKE 1 TABLET AS DIRECTED. 08/29/19   Kathyrn Drown, MD    Allergies    Ambien [zolpidem tartrate], Lipitor [atorvastatin calcium], Ranitidine, Simvastatin, Xanax xr [alprazolam er], Cholestatin, and Clopidogrel bisulfate  Review of Systems   Review of Systems Ten systems reviewed and are negative for acute change, except as noted in the HPI.   Physical Exam Updated Vital Signs BP (!) 106/55 (BP Location: Left Arm)   Pulse 83   Temp 98.6 F (37 C) (Oral)   Resp 16   Ht 6' 1" (1.854 m)   Wt 108.9 kg   SpO2 97%   BMI 31.66 kg/m   Physical Exam Vitals and nursing note reviewed.  Constitutional:      General: He is not in acute distress.    Appearance: He is well-developed. He is not diaphoretic.  HENT:     Head: Normocephalic and atraumatic.  Eyes:     General: No scleral icterus.    Conjunctiva/sclera: Conjunctivae normal.  Cardiovascular:     Rate and Rhythm: Normal rate and regular rhythm.     Heart sounds: Normal heart sounds.  Pulmonary:     Effort: Pulmonary effort is normal. No respiratory distress.     Breath sounds: Normal breath sounds.  Chest:     Chest wall: Tenderness present. No crepitus.       Comments: Patient with point tenderness under the left rib cage Abdominal:     Palpations: Abdomen is soft.     Tenderness: There is no abdominal tenderness.  Musculoskeletal:     Cervical back: Normal range of motion and neck supple.  Skin:    General: Skin is warm and dry.     Findings: No bruising.  Neurological:     Mental Status: He is alert.  Psychiatric:        Behavior: Behavior normal.     ED Results / Procedures / Treatments   Labs (all labs ordered are  listed, but only abnormal results are displayed) Labs Reviewed  BASIC METABOLIC  PANEL - Abnormal; Notable for the following components:      Result Value   Sodium 130 (*)    Chloride 97 (*)    CO2 21 (*)    Glucose, Bld 413 (*)    Creatinine, Ser 1.38 (*)    GFR calc non Af Amer 46 (*)    GFR calc Af Amer 54 (*)    All other components within normal limits  URINALYSIS, ROUTINE W REFLEX MICROSCOPIC - Abnormal; Notable for the following components:   Glucose, UA >=500 (*)    Hgb urine dipstick MODERATE (*)    All other components within normal limits  CBC WITH DIFFERENTIAL/PLATELET  PROTIME-INR    EKG None  Radiology DG Ribs Unilateral W/Chest Left  Result Date: 02/13/2020 CLINICAL DATA:  Left-sided rib pain after fall EXAM: LEFT RIBS AND CHEST - 3+ VIEW COMPARISON:  07/10/2018 FINDINGS: No fracture or other bone lesions are seen involving the ribs. There is no evidence of pneumothorax or pleural effusion. Low lung volumes without focal airspace consolidation. Heart size and mediastinal contours are within normal limits. Atherosclerotic calcification of the aortic knob. IMPRESSION: Negative. Electronically Signed   By: Davina Poke D.O.   On: 02/13/2020 11:02    Procedures Procedures (including critical care time)  Medications Ordered in ED Medications - No data to display  ED Course  I have reviewed the triage vital signs and the nursing notes.  Pertinent labs & imaging results that were available during my care of the patient were reviewed by me and considered in my medical decision making (see chart for details).  Clinical Course as of Feb 13 1208  Thu Feb 13, 2020  1112 DG Ribs Unilateral W/Chest Left [AH]  1138 Glucose(!): 413 [AH]    Clinical Course User Index [AH] Margarita Mail, PA-C   MDM Rules/Calculators/A&P                       Final Clinical Impression(s) / ED Diagnoses Final diagnoses:  Fall, initial encounter  Rib injury  Hyperglycemia   CC:  Left sided chest wall pain VS: Normal vital signs and hemodynamically stable without fever BP 107/71   Pulse 74   Temp 98.2 F (36.8 C) (Oral)   Resp 16   Ht 6' 1" (1.854 m)   Wt 108.9 kg   SpO2 96%   BMI 31.66 kg/m   PP:IRJJOAC is gathered by patient and EMR. DDX: Rib fracture, pulmonary contusion, weakness due to urinary tract infection, hypotension, anemia. Labs: I reviewed the labs which show urine shows some hemoglobin without evidence of infection.  His blood sugar is markedly elevated did not take his medications today.  INR is therapeutic.  CBC without elevated white blood cell count or other abnormality. Imaging: I personally reviewed the images (chest x-ray with left-sided rib view) which show(s) no acute rib fractures EKG:n/a MDM: Patient here with left-sided rib cage pain.  Clinically the patient is suspicious for occult rib fracture not evaluated on the patient's chest x-ray.  He has point tenderness, pain with breathing.  Has no hemoptysis.  His blood sugar is elevated but he not take his medications.  He is not having any evidence of infection including urinary tract, abdominal pain, nausea or vomiting.  He has no signs of ACS and I do not think that this bears evaluation at this time given the fact that his pain of complaint is chest wall pain with deep breathing.  Patient will be  discharged and states that he has follow-up tomorrow.  Discussed return precautions Patient disposition: Discharge Patient condition: good. The patient appears reasonably screened and/or stabilized for discharge and I doubt any other medical condition or other [facility] requiring further screening, evaluation, or treatment in the ED at this time prior to discharge. I have discussed lab and/or imaging findings with the patient and answered all questions/concerns to the best of my ability. I have discussed return precautions and OP follow up.     Rx / DC Orders ED Discharge Orders    None         Margarita Mail, PA-C 02/13/20 1439    Dorie Rank, MD 02/14/20 [phone]

## 2020-02-13 NOTE — Discharge Instructions (Addendum)
Your blood sugar was very high and you need to go home and take your medication! Get help right away if you: Have difficulty breathing or shortness of breath. Develop a continual cough or you cough up thick or bloody sputum. Feel nauseous or you vomit. Have pain in your abdomen.

## 2020-02-13 NOTE — Telephone Encounter (Signed)
See other message

## 2020-02-13 NOTE — ED Triage Notes (Signed)
Pt fell on Tuesday and is now having left sided rib pain. He said she just got weak and fell. Doesn't take any blood thinners. Denies hitting his head.

## 2020-02-14 ENCOUNTER — Ambulatory Visit (HOSPITAL_COMMUNITY)
Admission: RE | Admit: 2020-02-14 | Discharge: 2020-02-14 | Disposition: A | Discharge status: Home / Self Care | Payer: PPO | Source: Ambulatory Visit | Attending: Family Medicine | Admitting: Family Medicine

## 2020-02-14 DIAGNOSIS — R0602 Shortness of breath: Secondary | ICD-10-CM | POA: Diagnosis not present

## 2020-02-14 DIAGNOSIS — R0600 Dyspnea, unspecified: Secondary | ICD-10-CM

## 2020-02-14 NOTE — Progress Notes (Signed)
*  PRELIMINARY RESULTS* Echocardiogram 2D Echocardiogram has been performed.  [name] [name] 02/14/2020, 11:24 AM

## 2020-02-17 ENCOUNTER — Other Ambulatory Visit: Payer: Self-pay | Admitting: *Deleted

## 2020-02-17 DIAGNOSIS — R931 Abnormal findings on diagnostic imaging of heart and coronary circulation: Secondary | ICD-10-CM

## 2020-02-19 ENCOUNTER — Encounter: Payer: Self-pay | Admitting: Family Medicine

## 2020-02-27 DIAGNOSIS — E119 Type 2 diabetes mellitus without complications: Secondary | ICD-10-CM | POA: Diagnosis not present

## 2020-03-03 ENCOUNTER — Ambulatory Visit (INDEPENDENT_AMBULATORY_CARE_PROVIDER_SITE_OTHER): Payer: PPO | Admitting: Family Medicine

## 2020-03-03 ENCOUNTER — Other Ambulatory Visit: Payer: Self-pay

## 2020-03-03 ENCOUNTER — Telehealth: Payer: Self-pay | Admitting: Family Medicine

## 2020-03-03 ENCOUNTER — Encounter: Payer: Self-pay | Admitting: Family Medicine

## 2020-03-03 ENCOUNTER — Other Ambulatory Visit: Payer: PPO

## 2020-03-03 VITALS — BP 108/62 | HR 80 | Temp 97.6°F | Ht 73.0 in | Wt 240.0 lb

## 2020-03-03 DIAGNOSIS — E1142 Type 2 diabetes mellitus with diabetic polyneuropathy: Secondary | ICD-10-CM | POA: Diagnosis not present

## 2020-03-03 DIAGNOSIS — R0600 Dyspnea, unspecified: Secondary | ICD-10-CM

## 2020-03-03 DIAGNOSIS — J8410 Pulmonary fibrosis, unspecified: Secondary | ICD-10-CM | POA: Diagnosis not present

## 2020-03-03 DIAGNOSIS — Z7901 Long term (current) use of anticoagulants: Secondary | ICD-10-CM

## 2020-03-03 DIAGNOSIS — I5042 Chronic combined systolic (congestive) and diastolic (congestive) heart failure: Secondary | ICD-10-CM

## 2020-03-03 DIAGNOSIS — R0609 Other forms of dyspnea: Secondary | ICD-10-CM

## 2020-03-03 LAB — POCT INR: INR: 2.8 (ref 2.0–3.0)

## 2020-03-03 NOTE — Progress Notes (Signed)
   Subjective:    Patient ID: [name] [name], male    DOB: 1932/08/21, 86 y.o.   MRN: [id number]  HPI  Patient arrives to discuss recent test results O2 98% after walking to room. This patient has underlying shortness of breath with activity He denies any chest pressure tightness He had a recent chest x-ray which showed some fibrosis markings His echo showed ejection fraction of 40% with some right ventricular dysfunction as well Patient has chronic pedal edema Also has underlying risk factors for heart disease including diabetes and lipids age Results for orders placed or performed in visit on 03/03/20  POCT INR  Result Value Ref Range   INR 2.8 2.0 - 3.0    Review of Systems  Constitutional: Negative for activity change.  HENT: Negative for congestion and rhinorrhea.   Respiratory: Negative for cough and shortness of breath.   Cardiovascular: Negative for chest pain.  Gastrointestinal: Negative for abdominal pain, diarrhea, nausea and vomiting.  Genitourinary: Negative for dysuria and hematuria.  Neurological: Negative for weakness and headaches.  Psychiatric/Behavioral: Negative for behavioral problems and confusion.       Objective:   Physical Exam Vitals reviewed.  Constitutional:      General: He is not in acute distress. HENT:     Head: Normocephalic and atraumatic.  Eyes:     General:        Right eye: No discharge.        Left eye: No discharge.  Neck:     Trachea: No tracheal deviation.  Cardiovascular:     Rate and Rhythm: Normal rate.     Heart sounds: Normal heart sounds. No murmur.  Pulmonary:     Effort: Pulmonary effort is normal. No respiratory distress.     Breath sounds: Normal breath sounds.  Lymphadenopathy:     Cervical: No cervical adenopathy.  Skin:    General: Skin is warm and dry.  Neurological:     Mental Status: He is alert.     Coordination: Coordination normal.  Psychiatric:        Behavior: Behavior normal.   Blood pressure  108/62 sitting 100/60 standing patient has had some spells of lightheadedness with standing  30 minutes spent with patient reviewing records reviewing test results labs and discussing with patient and documentation Follow-up office visit 3 months     Assessment & Plan:  Atrial fibrillation on Coumadin INR looks good recheck monthly Diabetes I looked over the readings today they look under good control continue current measures and treatment Patient with chronic CHF stable currently. Blood pressure stays on the low end therefore not a good candidate for additional medications Mild orthostasis when he stands Will be seen cardiology in the coming months for further evaluation  Also has what appears to be pulmonary fibrosis.  We will see what cardiology has to say regarding his CHF first if his dyspnea persist may need to pursue pulmonary function and CT scan but currently it is unlikely that neither 1 of these will change his predicament regarding fibrosis  I have advised the patient not to drive  He likes to use his riding lawnmower to cut the grass that is fine if someone else is around while he is doing this

## 2020-03-06 NOTE — Telephone Encounter (Signed)
Please close make error made appointment instead of message.

## 2020-03-25 DIAGNOSIS — B9689 Other specified bacterial agents as the cause of diseases classified elsewhere: Secondary | ICD-10-CM | POA: Diagnosis not present

## 2020-03-25 DIAGNOSIS — C44319 Basal cell carcinoma of skin of other parts of face: Secondary | ICD-10-CM | POA: Diagnosis not present

## 2020-03-25 DIAGNOSIS — L0202 Furuncle of face: Secondary | ICD-10-CM | POA: Diagnosis not present

## 2020-03-25 DIAGNOSIS — Z08 Encounter for follow-up examination after completed treatment for malignant neoplasm: Secondary | ICD-10-CM | POA: Diagnosis not present

## 2020-03-25 DIAGNOSIS — Z85828 Personal history of other malignant neoplasm of skin: Secondary | ICD-10-CM | POA: Diagnosis not present

## 2020-04-01 ENCOUNTER — Other Ambulatory Visit: Payer: Self-pay

## 2020-04-01 ENCOUNTER — Encounter: Payer: Self-pay | Admitting: Physician Assistant

## 2020-04-01 ENCOUNTER — Ambulatory Visit (INDEPENDENT_AMBULATORY_CARE_PROVIDER_SITE_OTHER): Payer: PPO | Admitting: Physician Assistant

## 2020-04-01 VITALS — BP 110/60 | HR 78 | Ht 73.0 in | Wt 236.0 lb

## 2020-04-01 DIAGNOSIS — I4821 Permanent atrial fibrillation: Secondary | ICD-10-CM

## 2020-04-01 DIAGNOSIS — I5022 Chronic systolic (congestive) heart failure: Secondary | ICD-10-CM | POA: Diagnosis not present

## 2020-04-01 DIAGNOSIS — I2510 Atherosclerotic heart disease of native coronary artery without angina pectoris: Secondary | ICD-10-CM | POA: Diagnosis not present

## 2020-04-01 DIAGNOSIS — E782 Mixed hyperlipidemia: Secondary | ICD-10-CM | POA: Diagnosis not present

## 2020-04-01 DIAGNOSIS — Z952 Presence of prosthetic heart valve: Secondary | ICD-10-CM

## 2020-04-01 DIAGNOSIS — I6521 Occlusion and stenosis of right carotid artery: Secondary | ICD-10-CM | POA: Diagnosis not present

## 2020-04-01 DIAGNOSIS — I10 Essential (primary) hypertension: Secondary | ICD-10-CM | POA: Diagnosis not present

## 2020-04-01 DIAGNOSIS — R0602 Shortness of breath: Secondary | ICD-10-CM | POA: Diagnosis not present

## 2020-04-01 MED ORDER — ISOSORBIDE MONONITRATE ER 30 MG PO TB24: 15.0000 mg | ORAL_TABLET | Freq: Every day | ORAL | 3 refills | Status: DC

## 2020-04-01 NOTE — Progress Notes (Signed)
Cardiology Office Note:    Date:  04/01/2020   ID:  [name] [name], DOB [birth year], MRN [id number]  PCP:  [name] Drown, MD  Cardiologist:  Sherren Mocha, MD   Electrophysiologist:  None   Referring MD: [name] Drown, MD   Chief Complaint:  Shortness of Breath    Patient Profile:    [name] [name] is a 86 y.o. male with:   Aortic stenosis  S/p TAVR 03/2016  Coronary artery disease  Moderate nonobstructive disease by cardiac catheterization in 2017  Carotid artery disease  S/p R carotid stent 03/2016  Permanent atrial fibrillation  Chronic systolic CHF  Nonischemic cardiomyopathy  Echo 7/17: EF 35 (severe low gradient AS with low EF)  Echo 3/18: EF 40  Hypertension  Diabetes mellitus  Hyperlipidemia  Prior CV studies: Echocardiogram 02/14/2020 EF 40, global HK, mild LVH, mildly reduced RV SF, severe LAE, moderate RAE, mild MR, TAVR with mean gradient 5 mmHg, no AI  Carotid US 11/01/2017 Patent R ICA stent; LICA 81-85  Echocardiogram 11/01/2017 Mild LVH, EF 35-40, diffuse HK, s/p TAVR with mean gradient 5 mmHg, no AI, mild MAC, mild MR, moderate-severe LAE, mild RAE, mild TR, PASP 44  Echocardiogram 06/19/2017 Mild LVH, EF 40-45, diffuse HK, s/p TAVR, mean gradient 6 mmHg, no PVL, mild MR, mod LAE, PASP 38  Cardiac catheterization 06/17/2016 LAD mid 75; D2 75 LCx ostial 50, mid 80 RCA mid 40  History of Present Illness:    [name] was previously followed in the congestive heart failure clinic.  He last saw Dr. Haroldine Laws in June 2018.  He last saw [name] via telemedicine in May 2020.  He was seen by primary care in March with complaints of shortness of breath.  Chest x-ray did show fibrotic changes.  Follow-up echocardiogram demonstrated fairly stable LV function with an EF of 40%, mildly reduced RV SF and normally functioning TAVR.  Follow-up with cardiology was recommended.  He is here today with his nephew.  He has been more short of breath  with exertion for several weeks.  He has not really had chest discomfort.  He usually has to stop to catch his breath after about 200 feet.  He sometimes gets short of breath taking a shower.  He has not had orthopnea or PND.  He has not had significant lower extremity swelling.  He has not had syncope.  He does feel lightheaded when he is short of breath with exertion.     Past Medical History:  Diagnosis Date  . Aortic stenosis    a. mod-sev by echo 05/2016.  . Asthma   . Cerebrovascular disease 07/2010   TIA; carotid ultrasound in 07/2010-significant bilateral plaque without focal internal carotid artery stenosis; MRI -encephalomalacia left temporal and right temporal lobes; small inferior right cerebellar infarct; small vessel disease  . Chronic atrial fibrillation (HCC)    Paroxysmal; Echocardiogram in 2007-normal EF; mild LVH; left atrial enlargement; mild stenosis and minimal AI; negative stress nuclear study in 2008  . Chronic systolic CHF (congestive heart failure) ([place])    a. dx 05/2016 - EF 35-40%, diffuse HK, mod-severe AS, mod gradient, severe AVA VTI likely due to decreased cardiac output in setting of systolic dysfsunction and significant mitral regurgitation, mild MR, mod-severe MR, severe LAE, mild-mod RV dilation, mild RAE, mild-mod TR, mod PASP 53mHg.  . CKD (chronic kidney disease), stage II   . Degenerative joint disease    feet and legs  . Diabetes mellitus  no insulin; A1c of 6.6 in 2005  . Dizziness    occurs daily,especially in am  . Exertional dyspnea   . Gastroesophageal reflux disease   . Hepatic steatosis   . History of noncompliance with medical treatment   . Hyperlipidemia    adverse reactions to statins and niacin  . Hypertension    Borderline  . Irregular heartbeat   . Mitral regurgitation    a. mod-sev by echo 05/2016  . Peripheral vascular disease ([place])   . Renal insufficiency   . S/P TAVR (transcatheter aortic valve replacement) 07/19/2016   29 mm  Edwards Sapien 3 transcatheter heart valve placed via left percutaneous transfemoral approach  . Temporal arteritis ([place])   . Tricuspid regurgitation    a. mild-mod TR by echo 05/2016    Current Medications: Current Meds  Medication Sig  . albuterol (PROVENTIL) (2.5 MG/3ML) 0.083% nebulizer solution Take 3 mLs (2.5 mg total) by nebulization every 6 (six) hours as needed for wheezing or shortness of breath.  . bisoprolol (ZEBETA) 5 MG tablet TAKE (1/2) TABLET BY MOUTH ONCE DAILY.  . blood glucose meter kit and supplies Dispense based on patient and insurance preference. Use to test glucose three times daily.  (FOR ICD-10)  . cholecalciferol (VITAMIN D) 1000 units tablet Take 1,000 Units by mouth daily.  [name] doxycycline (VIBRAMYCIN) 100 MG capsule Take 100 mg by mouth 2 (two) times daily.  . furosemide (LASIX) 20 MG tablet TAKE 1 TABLET BY MOUTH ON MONDAY, WEDNESDAY AND FRIDAY.  . hydrOXYzine (ATARAX/VISTARIL) 25 MG tablet Take 1 tablet (25 mg total) by mouth every 6 (six) hours as needed. Stop benadryl  . insulin glargine (LANTUS SOLOSTAR) 100 UNIT/ML Solostar Pen INJECT 32 UNITS EVERY EVENING.  [name] ketoconazole (NIZORAL) 2 % cream Apply 1 application topically 2 (two) times daily.  . metFORMIN (GLUCOPHAGE-XR) 500 MG 24 hr tablet TAKE 1 TABLET BY MOUTH ONCE DAILY WITH BREAKFAST.  . mupirocin ointment (BACTROBAN) 2 % SMARTSIG:1 Application Topical 2-3 Times Daily  . nortriptyline (PAMELOR) 10 MG capsule TAKE 1 OR 2 CAPSULES AT BEDTIME FOR BURNING IN FEET.[28 IN PACKS/ NONE IN BOTTLE]  . potassium chloride SA (KLOR-CON) 20 MEQ tablet TAKE 1 TABLET BY MOUTH ON MONDAY, WEDNESDAY AND FRIDAY.  . rosuvastatin (CRESTOR) 5 MG tablet TAKE (1) TABLET BY MOUTH ONCE DAILY AT BEDTIME.  [name] TRUEPLUS PEN NEEDLES 31G X 8 MM MISC USE WITH LANTUS SOLOSTAR EVERY EVENING  . warfarin (COUMADIN) 6 MG tablet TAKE 1 TABLET AS DIRECTED.     Allergies:   Ambien [zolpidem tartrate], Lipitor [atorvastatin calcium],  Ranitidine, Simvastatin, Xanax xr [alprazolam er], Cholestatin, and Clopidogrel bisulfate   Social History   Tobacco Use  . Smoking status: Former Smoker    Packs/day: 1.00    Years: 20.00    Pack years: 20.00    Types: Cigarettes    Start date: 07/04/1950    Quit date: 04/25/1992    Years since quitting: 27.9  . Smokeless tobacco: Current User    Types: Chew  Substance Use Topics  . Alcohol use: No    Alcohol/week: 0.0 standard drinks  . Drug use: No     Family Hx: The patient's family history includes Diabetes in his father; Stroke in his mother. There is no history of Colon cancer.  Review of Systems  Constitution: Negative for fever.  Respiratory: Negative for cough and wheezing.   Gastrointestinal: Negative for hematochezia and melena.  Genitourinary: Negative for hematuria.  All other systems reviewed  and are negative.    EKGs/Labs/Other Test Reviewed:    EKG:  EKG is  ordered today.  The ekg ordered today demonstrates atrial fibrillation, HR 78, left bundle branch block, QTC 465, no change from prior tracing  Recent Labs: 01/24/2020: ALT 23 02/13/2020: BUN 23; Creatinine, Ser 1.38; Hemoglobin 14.3; Platelets 197; Potassium 4.4; Sodium 130   Recent Lipid Panel Lab Results  Component Value Date/Time   CHOL 137 01/24/2020 09:00 AM   TRIG 147 01/24/2020 09:00 AM   HDL 37 (L) 01/24/2020 09:00 AM   CHOLHDL 4.3 07/23/2019 02:52 PM   CHOLHDL 5.9 08/01/2014 10:03 AM   LDLCALC 74 01/24/2020 09:00 AM    Physical Exam:    VS:  BP 110/60   Pulse 78   Ht _0  (1.854 m)   Wt 236 lb (107 kg)   SpO2 97%   BMI 31.14 kg/m     Wt Readings from Last 3 Encounters:  04/01/20 236 lb (107 kg)  03/03/20 240 lb (108.9 kg)  02/13/20 240 lb (108.9 kg)     Constitutional:      Appearance: Healthy appearance. Not in distress.  Neck:     Thyroid: No thyromegaly.     Vascular: JVD elevated with 6 cm of water.  Pulmonary:     Effort: Pulmonary effort is normal.     Breath  sounds: No wheezing. No rales.  Cardiovascular:     Normal rate. Irregularly irregular rhythm. Normal S1. Normal S2.     Murmurs: There is no murmur.  Edema:    Peripheral edema absent.  Abdominal:     Palpations: Abdomen is soft. There is no hepatomegaly.  Skin:    General: Skin is warm and dry.  Neurological:     General: No focal deficit present.     Mental Status: Alert and oriented to person, place and time.     Cranial Nerves: Cranial nerves are intact.       ASSESSMENT & PLAN:    1. Shortness of breath I suspect his shortness of breath is multifactorial and related to coronary artery disease, aortic valve disease, systolic heart failure, advanced age and deconditioning.  He does note significant issues with his back.  This is likely playing a significant role in his functional status.  We ambulated him in the hallways today and his oxygen saturation remained 94-97% on room air.  We had a long discussion regarding potential causes for his shortness of breath.  He did have multivessel, moderate nonobstructive disease at cardiac catheterization several years ago.  His coronary artery disease could have progressed and his symptoms could be an anginal equivalent.  He could be volume overloaded.  The fibrotic changes on chest x-ray may be an early sign of interstitial lung disease.  However, his oxygen saturation did not reduce with ambulation.  We discussed the rationale for proceeding with nuclear stress testing and potentially undergoing cardiac catheterization.  He would be at increased risk for invasive cardiac evaluation.  After some discussion, he notes that he would not be interested in pursuing cardiac catheterization.  I offered him a trial of long-acting nitrates to see if this improves his symptoms.  I will also obtain a BNP and adjust his furosemide if needed.  -Isosorbide mononitrate 15 mg daily  -BNP  -Adjust furosemide if BNP significantly elevated  -Follow-up with [name]  or me in 4 to 6 weeks  2. Chronic systolic heart failure (HCC) Overall, ejection fraction has remained stable.  Most recent echocardiogram with an EF of 40%.  Nonischemic cardiomyopathy.  NYHA III.  Volume status overall looks to be stable.  Obtain BNP today and adjust furosemide as needed.  Blood pressure limits medication titration.  Continue current dose of bisoprolol.  Add isosorbide as noted.  3. Coronary artery disease involving native coronary artery of native heart without angina pectoris Moderate nonobstructive disease by cardiac catheterization 2017.  He is not having chest pain.  I will start him on low-dose isosorbide to see if this improves his symptoms.  He is not on aspirin as he is on Warfarin.  Continue rosuvastatin.  4. S/P TAVR (transcatheter aortic valve replacement) Recent echocardiogram was stable TAVR function and no significant AI.  Continue SBE prophylaxis.  5. Stenosis of right carotid artery History of right carotid artery stent.  Continue rosuvastatin.  6. Essential hypertension The patient's blood pressure is controlled on his current regimen.  Continue current therapy.   7. Mixed hyperlipidemia Continue statin therapy.  Recent LDL optimal at 74.   8.  Permanent atrial fibrillation Heart rate remains controlled.  Warfarin is managed by primary care.   Dispo:  Return in about 6 weeks (around 05/13/2020) for Routine Follow Up, w/ [name], or Richardson Dopp, PA-C, in person.   Medication Adjustments/Labs and Tests Ordered: Current medicines are reviewed at length with the patient today.  Concerns regarding medicines are outlined above.  Tests Ordered: Orders Placed This Encounter  Procedures  . Pro b natriuretic peptide (BNP)  . EKG 12-Lead   Medication Changes: Meds ordered this encounter  Medications  . isosorbide mononitrate (IMDUR) 30 MG 24 hr tablet    Sig: Take 0.5 tablets (15 mg total) by mouth daily.    Dispense:  45 tablet    Refill:  3     Signed, Richardson Dopp, PA-C  04/01/2020 5:54 PM    [facility] [place], [place], [place]  [address] Phone: [phone]; Fax: [phone]

## 2020-04-01 NOTE — Patient Instructions (Addendum)
Medication Instructions:   Your physician has recommended you make the following change in your medication:   1) Start Imdur 15 mg, 0.5 tablet by mouth once a day  *If you need a refill on your cardiac medications before your next appointment, please call your pharmacy*  Lab Work:  You will have labs drawn today: BNP  Testing/Procedures:  None ordered today  Follow-Up:  On 05/04/20 at 2:15PM with Richardson Dopp, PA-C

## 2020-04-02 ENCOUNTER — Other Ambulatory Visit (INDEPENDENT_AMBULATORY_CARE_PROVIDER_SITE_OTHER): Payer: PPO

## 2020-04-02 ENCOUNTER — Other Ambulatory Visit: Payer: Self-pay

## 2020-04-02 DIAGNOSIS — I10 Essential (primary) hypertension: Secondary | ICD-10-CM

## 2020-04-02 DIAGNOSIS — R0602 Shortness of breath: Secondary | ICD-10-CM

## 2020-04-02 DIAGNOSIS — Z7901 Long term (current) use of anticoagulants: Secondary | ICD-10-CM | POA: Diagnosis not present

## 2020-04-02 DIAGNOSIS — I5022 Chronic systolic (congestive) heart failure: Secondary | ICD-10-CM

## 2020-04-02 LAB — POCT INR: INR: 3 (ref 2.0–3.0)

## 2020-04-02 LAB — PRO B NATRIURETIC PEPTIDE: NT-Pro BNP: 985 pg/mL — ABNORMAL HIGH (ref 0–486)

## 2020-04-02 NOTE — Patient Instructions (Signed)
Continue same treatment. Next INR in 4 weeks.

## 2020-04-06 ENCOUNTER — Other Ambulatory Visit: Payer: Self-pay | Admitting: Family Medicine

## 2020-04-16 ENCOUNTER — Other Ambulatory Visit: Payer: Self-pay

## 2020-04-16 ENCOUNTER — Other Ambulatory Visit: Payer: PPO | Admitting: *Deleted

## 2020-04-16 DIAGNOSIS — R0602 Shortness of breath: Secondary | ICD-10-CM | POA: Diagnosis not present

## 2020-04-16 DIAGNOSIS — I10 Essential (primary) hypertension: Secondary | ICD-10-CM | POA: Diagnosis not present

## 2020-04-16 DIAGNOSIS — I5022 Chronic systolic (congestive) heart failure: Secondary | ICD-10-CM | POA: Diagnosis not present

## 2020-04-16 LAB — BASIC METABOLIC PANEL
BUN/Creatinine Ratio: 15 (ref 10–24)
BUN: 21 mg/dL (ref 8–27)
CO2: 23 mmol/L (ref 20–29)
Calcium: 9.3 mg/dL (ref 8.6–10.2)
Chloride: 97 mmol/L (ref 96–106)
Creatinine, Ser: 1.38 mg/dL — ABNORMAL HIGH (ref 0.76–1.27)
GFR calc Af Amer: 54 mL/min/{1.73_m2} — ABNORMAL LOW (ref >59)
GFR calc non Af Amer: 46 mL/min/{1.73_m2} — ABNORMAL LOW (ref >59)
Glucose: 238 mg/dL — ABNORMAL HIGH (ref 65–99)
Potassium: 4.8 mmol/L (ref 3.5–5.2)
Sodium: 137 mmol/L (ref 134–144)

## 2020-04-17 ENCOUNTER — Telehealth: Payer: Self-pay | Admitting: Cardiovascular Disease

## 2020-04-17 NOTE — Telephone Encounter (Signed)
Patient's nephew returning Emily's call in regards to lab results.

## 2020-04-17 NOTE — Telephone Encounter (Signed)
I called and left patients nephew a message to call back.

## 2020-04-17 NOTE — Telephone Encounter (Signed)
The patients [name] has been notified of the result and verbalized understanding.  All questions (if any) were answered. [name] [name], [place] 04/17/2020 2:38 PM

## 2020-04-20 ENCOUNTER — Telehealth: Payer: Self-pay | Admitting: Cardiovascular Disease

## 2020-04-20 NOTE — Telephone Encounter (Signed)
Spoke with Mr. Mctigue's nephew, Richardson Landry.  He is concerned the patient has lost almost 20 pounds in 3 weeks and worsening DOE.  Richardson Landry reports the patient is eating and his appetite is good. He says he has no swelling and denies CP. Richardson Landry says he thinks he is hydrated adequately and he is urinating normally.  He is taking Lasix 20 mg three times weekly.  Confirmed that his weight is being taken at the same time daily under the same circumstances. He has a CNA daily at his home and she takes his VS. The nephew has no BP or HR to report but does say the CNA would notify him if something was off.   Currently, the patient feels fine. Richardson Landry states his breathing is much better than this morning and he is in no distress.  Mr. Capek is scheduled to see Richardson Dopp 6/21 for 6 week follow-up. Instructed Richardson Landry to contact the patient's PCP for evaluation for rapid weight loss today. He understands he will be called if Cardiology has any other recommendations in the meantime.

## 2020-04-20 NOTE — Telephone Encounter (Signed)
Pt c/o Shortness Of Breath: STAT if SOB developed within the last 24 hours or pt is noticeably SOB on the phone  1. Are you currently SOB (can you hear that pt is SOB on the phone)? Yes   2. How long have you been experiencing SOB? Last couple of days  3. Are you SOB when sitting or when up moving around? Moving around   4. Are you currently experiencing any other symptoms? Lost weight is now 217 lbs   [name] [name] the patient's caregiver is calling stating he feels [name] [name] needs an appointment with [name] or [name] [name] asap. He states [name] has been experiencing SOB for the past couple of days when moving around and that he has also lost weight and now only weighs 217 lbs. [name] [name] states he spoke with [name] [name] at home nurse and she stated he was currently experiencing SOB this morning, but [name] [name] was not with [name] [name] in order to try and transfer the call to triage. I advised [name] [name] and [name] do not have anything available before July, but that I would send a message to have a nurse callback in regards to it. Please advise.

## 2020-04-23 ENCOUNTER — Other Ambulatory Visit: Payer: Self-pay | Admitting: Family Medicine

## 2020-04-28 DIAGNOSIS — E119 Type 2 diabetes mellitus without complications: Secondary | ICD-10-CM | POA: Diagnosis not present

## 2020-04-30 ENCOUNTER — Other Ambulatory Visit (INDEPENDENT_AMBULATORY_CARE_PROVIDER_SITE_OTHER): Payer: PPO | Admitting: *Deleted

## 2020-04-30 ENCOUNTER — Other Ambulatory Visit: Payer: Self-pay

## 2020-04-30 ENCOUNTER — Telehealth: Payer: Self-pay | Admitting: *Deleted

## 2020-04-30 DIAGNOSIS — Z7901 Long term (current) use of anticoagulants: Secondary | ICD-10-CM | POA: Diagnosis not present

## 2020-04-30 LAB — POCT INR: INR: 2.7 (ref 2.0–3.0)

## 2020-04-30 NOTE — Telephone Encounter (Signed)
Pt dropped off blood sugar readings. In dr scott's folder for review.

## 2020-04-30 NOTE — Telephone Encounter (Signed)
Numbers overall look good.  Continue with regular INR. Nurses it is fine with me that the patient could do his INR and his office visit on the same day rather than making him come in on the 15th for the INR in checkup on 20 July Please combine both of them fourth July 20 if that is fine with family thank you

## 2020-05-01 NOTE — Telephone Encounter (Signed)
Left message to return call 

## 2020-05-01 NOTE — Telephone Encounter (Signed)
Pt nephew returned call and verbalized understanding. Nephew is fine with combing INR and check up to one visit on July 20th. Nurse visit cancelled for 05/28/20

## 2020-05-03 NOTE — H&P (View-Only) (Signed)
Cardiology Office Note:    Date:  05/04/2020   ID:  [name] [name], DOB 02/24/1932, MRN [id number]  PCP:  Luking, Cristy Colmenares A, MD  Cardiologist:  [name] Cooper, MD  Electrophysiologist:  None   Referring MD: Luking, Keelin Sheridan A, MD   Chief Complaint:  Follow-up (CHF) and Shortness of Breath    Patient Profile:    [name] [name] is [name] 87 y.o. male with:   Aortic stenosis ? S/p TAVR 03/2016  Coronary artery disease ? Moderate nonobstructive disease by cardiac catheterization in 2017  Carotid artery disease ? S/p R carotid stent 03/2016  Permanent atrial fibrillation  Chronic systolic CHF ? Nonischemic cardiomyopathy ? Echo 7/17: EF 35 (severe low gradient AS with low EF) ? Echo 3/18: EF 40  Hypertension  Diabetes mellitus  Hyperlipidemia  Prior CV studies: Echocardiogram 02/14/2020 EF 40, global HK, mild LVH, mildly reduced RV SF, severe LAE, moderate RAE, mild MR, TAVR with mean gradient 5 mmHg, no AI  Carotid US 11/01/2017 Patent R ICA stent; LICA 50-69  Echocardiogram 11/01/2017 Mild LVH, EF 35-40, diffuse HK, s/p TAVR with mean gradient 5 mmHg, no AI, mild MAC, mild MR, moderate-severe LAE, mild RAE, mild TR, PASP 44  Echocardiogram 06/19/2017 Mild LVH, EF 40-45, diffuse HK, s/p TAVR, mean gradient 6 mmHg, no PVL, mild MR, mod LAE, PASP 38  Cardiac catheterization 06/17/2016 LAD mid 75; D2 75 LCx ostial 50, mid 80 RCA mid 40  History of Present Illness:    [name] [name] was seen by primary care in March with complaints of shortness of breath.  Chest x-ray did show fibrotic changes.  Follow-up echocardiogram demonstrated fairly stable LV function with an EF of 40%, mildly reduced RV SF and normally functioning TAVR.  I saw him in follow up in 03/2020.  I suspected his shortness of breath was multifactorial and related to coronary artery disease, AoV disease, congestive heart failure, advanced age and deconditioning (severe back issues).  His O2 sats were normal with  ambulation.  We had [name] long discussion regarding further testing vs conservative management.  He preferred conservative management.  I placed him on low dose nitrates.  [name] BNP was also mildly elevated and I adjusted his furosemide.  He returns for follow up.     He is here today with his son.  Since last seen, his shortness of breath has gotten worse.  He notes dyspnea with just minimal activity.  At times, he seems to be short of breath at rest in the office.  He has not had chest discomfort, syncope, increased abdominal girth or lower extremity swelling.  He has not had orthopnea or PND.  He has not had any bleeding issues such as melena, hematochezia or hematuria.  He has not had fevers, chills, cough.  He does note significant fatigue.  Past Medical History:  Diagnosis Date  . Acute on chronic systolic CHF (congestive heart failure) (HCC) 02/09/2017  . Acute respiratory failure with hypoxia (HCC)   . AKI (acute kidney injury) (HCC) 08/20/2015  . Altered mental status 02/21/2017  . Aortic stenosis    [name]. mod-sev by echo 05/2016.  . Asthma   . Asthma exacerbation 08/20/2015  . Atrial fibrillation (HCC)    Paroxysmal; Echo in 2007-normal EF; mild LVH; LA enlargement; mild AS, minimal AI; neg. stress nuclear study in 2008   . BPH (benign prostatic hypertrophy) with urinary obstruction 09/27/2011   Transurethral resection of the prostate in 09/2011   . CAD (coronary artery   disease), native coronary artery 06/28/2016   Cardiogenic shock ([place])    Carotid stenosis 04/25/2012   Cerebrovascular disease 07/2010   TIA; carotid ultrasound in 07/2010-significant bilateral plaque without focal internal carotid artery stenosis; MRI -encephalomalacia left temporal and right temporal lobes; small inferior right cerebellar infarct; small vessel disease   Chronic atrial fibrillation (HCC)    Paroxysmal; Echocardiogram in 2007-normal EF; mild LVH; left atrial enlargement; mild stenosis and minimal AI; negative stress  nuclear study in 2595   Chronic systolic CHF (congestive heart failure) ([place])    [name]. dx 05/2016 - EF 35-40%, diffuse HK, mod-severe AS, mod gradient, severe AVA VTI likely due to decreased cardiac output in setting of systolic dysfsunction and significant mitral regurgitation, mild MR, mod-severe MR, severe LAE, mild-mod RV dilation, mild RAE, mild-mod TR, mod PASP 72mHg.   CKD (chronic kidney disease), stage II    Cognitive dysfunction 05/29/2015   I believe that this is related to his age as well as previous stroke    Congestive heart failure (CHF) ([place] 02/09/2017   Degenerative joint disease    feet and legs   Diabetes mellitus    no insulin; A1c of 6.6 in 2005   Dizziness    occurs daily,especially in am   Elevated troponin    Exertional dyspnea    Gastroesophageal reflux disease    Hepatic steatosis    History of noncompliance with medical treatment    Hyperlipidemia    adverse reactions to statins and niacin   Hypertension    Borderline   Irregular heartbeat    Mitral regurgitation    [name]. mod-sev by echo 05/2016   Peripheral vascular disease ([facility]    Renal insufficiency    S/P TAVR (transcatheter aortic valve replacement) 07/19/2016   29 mm Edwards Sapien 3 transcatheter heart valve placed via left percutaneous transfemoral approach   Senile purpura ([place] 08/15/2018   Temporal arteritis (HCC)    Tricuspid regurgitation    [name]. mild-mod TR by echo 05/2016   VT (ventricular tachycardia) (HCC)     Current Medications: Current Meds  Medication Sig   albuterol (PROVENTIL) (2.5 MG/3ML) 0.083% nebulizer solution Take 3 mLs (2.5 mg total) by nebulization every 6 (six) hours as needed for wheezing or shortness of breath.   bisoprolol (ZEBETA) 5 MG tablet TAKE (1/2) TABLET BY MOUTH ONCE DAILY.   blood glucose meter kit and supplies Dispense based on patient and insurance preference. Use to test glucose three times daily.  (FOR ICD-10)   insulin glargine (LANTUS  SOLOSTAR) 100 UNIT/ML Solostar Pen INJECT 32 UNITS EVERY EVENING.   metFORMIN (GLUCOPHAGE-XR) 500 MG 24 hr tablet TAKE 1 TABLET BY MOUTH ONCE DAILY WITH BREAKFAST.   mupirocin ointment (BACTROBAN) 2 % SMARTSIG:1 Application Topical 2-3 Times Daily   nortriptyline (PAMELOR) 10 MG capsule TAKE 1 OR 2 CAPSULES AT BEDTIME FOR BURNING IN FEET.[28 IN PACKS/ NONE IN BOTTLE]   potassium chloride SA (KLOR-CON) 20 MEQ tablet TAKE 1 TABLET BY MOUTH ON MONDAY, WEDNESDAY AND FRIDAY.   rosuvastatin (CRESTOR) 5 MG tablet TAKE (1) TABLET BY MOUTH ONCE DAILY AT BEDTIME.   TRUEPLUS PEN NEEDLES 31G X 8 MM MISC USE WITH LANTUS SOLOSTAR EVERY EVENING   warfarin (COUMADIN) 6 MG tablet TAKE 1 TABLET AS DIRECTED.     Allergies:   Ambien [zolpidem tartrate], Lipitor [atorvastatin calcium], Ranitidine, Simvastatin, Xanax xr [alprazolam er], Cholestatin, and Clopidogrel bisulfate   Social History   Tobacco Use   Smoking status: Former Smoker  Packs/day: 1.00    Years: 20.00    Pack years: 20.00    Types: Cigarettes    Start date: 07/04/1950    Quit date: 04/25/1992    Years since quitting: 28.0  . Smokeless tobacco: Current User    Types: Chew  Substance Use Topics  . Alcohol use: No    Alcohol/week: 0.0 standard drinks  . Drug use: No     Family Hx: The patient's family history includes Diabetes in his father; Stroke in his mother. There is no history of Colon cancer.  Review of Systems  Constitutional: Negative for chills and fever.  Respiratory: Positive for shortness of breath. Negative for cough.   Gastrointestinal: Negative for hematochezia and melena.  Genitourinary: Negative for hematuria.  All other systems reviewed and are negative.    EKGs/Labs/Other Test Reviewed:    EKG:  EKG is  ordered today.  The ekg ordered today demonstrates atrial fibrillation, HR 80, left bundle branch block, no change from prior tracing  Recent Labs: 01/24/2020: ALT 23 02/13/2020: Hemoglobin 14.3;  Platelets 197 04/01/2020: NT-Pro BNP 985 04/16/2020: BUN 21; Creatinine, Ser 1.38; Potassium 4.8; Sodium 137   Recent Lipid Panel Lab Results  Component Value Date/Time   CHOL 137 01/24/2020 09:00 AM   TRIG 147 01/24/2020 09:00 AM   HDL 37 (L) 01/24/2020 09:00 AM   CHOLHDL 4.3 07/23/2019 02:52 PM   CHOLHDL 5.9 08/01/2014 10:03 AM   LDLCALC 74 01/24/2020 09:00 AM    Physical Exam:    VS:  BP (!) 90/40   Pulse 80   Ht 6' 1" (1.854 m)   SpO2 96%   BMI 31.14 kg/m     Wt Readings from Last 3 Encounters:  04/01/20 236 lb (107 kg)  03/03/20 240 lb (108.9 kg)  02/13/20 240 lb (108.9 kg)     Constitutional:      Appearance: Not in distress. Chronically ill-appearing.  Neck:     Vascular: JVD normal.     Lymphadenopathy: No cervical adenopathy.  Pulmonary:     Breath sounds: No wheezing. No rales.  Cardiovascular:     Irregularly irregular rhythm. Normal S1. Normal S2.     Murmurs: There is no murmur.  Edema:    Peripheral edema absent.  Abdominal:     Palpations: Abdomen is soft. There is no hepatomegaly.  Skin:    General: Skin is warm and dry.  Neurological:     General: No focal deficit present.     Mental Status: Alert and oriented to person, place and time.     Cranial Nerves: Cranial nerves are intact.  Psychiatric:        Mood and Affect: Affect normal.      ASSESSMENT & PLAN:    1. Shortness of breath 2. Chronic systolic heart failure (HCC) 3. Coronary artery disease involving native coronary artery of native heart without angina pectoris 4. Pulmonary fibrosis (HCC) The patient presents for follow-up today.  His shortness of breath has gotten worse since he was last seen.  He sometimes seems to be short of breath at rest.  However, at other times, he seems to be breathing well at rest.  His exam is not suggestive of volume excess.  He did have [name] chest x-ray with fibrotic changes in the bases couple of months ago.  However, he had recent ambulatory O2 sats  obtained which were normal.  He also describes significant weakness.  Question if he may be anemic.  He is not   He is not having chest discomfort.  However, he did have moderate diffuse disease at cardiac catheterization in 2017 prior to his TAVR.  Etiology for his shortness of breath is not entirely clear.  Question if this is all related to congestive heart failure versus progressive coronary artery disease versus pulmonary disease.  I discussed potential work-up with the patient to include cardiac catheterization, lab work and referral to pulmonology.  I also discussed this with Dr. Radford Pax (attending MD) who agreed.  Risks and benefits of cardiac catheterization have been discussed with the patient.  These include bleeding, infection, kidney damage, stroke, heart attack, death.  The patient understands these risks and is willing to proceed.   -Arrange right and left heart catheterization next week  -Obtain CBC, BMET, INR  -Refer to pulmonology  5. S/P TAVR (transcatheter aortic valve replacement) Recent echocardiogram with stable aortic valve gradients and no AI.  Continue SBE prophylaxis.  6. Permanent atrial fibrillation (HCC) Heart rate is well controlled.  He will need his warfarin held for his cardiac catheterization.  The patient denies any history of stroke stroke in the past.  However, there is [name] head CT in his chart from March 2018 which demonstrates stable chronic infarcts in the bilateral temporal lobes, cerebellar hemispheres and basal ganglia.  I will review with our pharmacy.  I suspect he will need Lovenox coverage while he is off of warfarin for cardiac catheterization.   Dispo:  Return in about 2 weeks (around 05/18/2020) for Post Procedure Follow Up, w/ [name], or Richardson Dopp, PA-C, in person.   Medication Adjustments/Labs and Tests Ordered: Current medicines are reviewed at length with the patient today.  Concerns regarding medicines are outlined above.  Tests Ordered: Orders Placed  This Encounter  Procedures   Basic metabolic panel   CBC   Protime-INR   Ambulatory referral to Pulmonology   EKG 12-Lead   Medication Changes: No orders of the defined types were placed in this encounter.   Signed, Richardson Dopp, PA-C  05/04/2020 5:19 PM    [facility] [place], [place], [place]  [address] Phone: [phone]; Fax: [phone]

## 2020-05-03 NOTE — Progress Notes (Signed)
Cardiology Office Note:    Date:  05/04/2020   ID:  [name] [name], DOB 19-Jun-1932, MRN [id number]  PCP:  [name] Drown, MD  Cardiologist:  [name] Mocha, MD  Electrophysiologist:  None   Referring MD: [name] Drown, MD   Chief Complaint:  Follow-up (CHF) and Shortness of Breath    Patient Profile:    [name] [name] is a 86 y.o. male with:   Aortic stenosis ? S/p TAVR 03/2016  Coronary artery disease ? Moderate nonobstructive disease by cardiac catheterization in 2017  Carotid artery disease ? S/p R carotid stent 03/2016  Permanent atrial fibrillation  Chronic systolic CHF ? Nonischemic cardiomyopathy ? Echo 7/17: EF 35 (severe low gradient AS with low EF) ? Echo 3/18: EF 40  Hypertension  Diabetes mellitus  Hyperlipidemia  Prior CV studies: Echocardiogram 02/14/2020 EF 40, global HK, mild LVH, mildly reduced RV SF, severe LAE, moderate RAE, mild MR, TAVR with mean gradient 5 mmHg, no AI  Carotid US 11/01/2017 Patent R ICA stent; LICA 83-41  Echocardiogram 11/01/2017 Mild LVH, EF 35-40, diffuse HK, s/p TAVR with mean gradient 5 mmHg, no AI, mild MAC, mild MR, moderate-severe LAE, mild RAE, mild TR, PASP 44  Echocardiogram 06/19/2017 Mild LVH, EF 40-45, diffuse HK, s/p TAVR, mean gradient 6 mmHg, no PVL, mild MR, mod LAE, PASP 38  Cardiac catheterization 06/17/2016 LAD mid 75; D2 75 LCx ostial 50, mid 80 RCA mid 40  History of Present Illness:    [name] [name] was seen by primary care in March with complaints of shortness of breath.  Chest x-ray did show fibrotic changes.  Follow-up echocardiogram demonstrated fairly stable LV function with an EF of 40%, mildly reduced RV SF and normally functioning TAVR.  I saw him in follow up in 03/2020.  I suspected his shortness of breath was multifactorial and related to coronary artery disease, AoV disease, congestive heart failure, advanced age and deconditioning (severe back issues).  His O2 sats were normal with  ambulation.  We had a long discussion regarding further testing vs conservative management.  He preferred conservative management.  I placed him on low dose nitrates.  A BNP was also mildly elevated and I adjusted his furosemide.  He returns for follow up.     He is here today with his son.  Since last seen, his shortness of breath has gotten worse.  He notes dyspnea with just minimal activity.  At times, he seems to be short of breath at rest in the office.  He has not had chest discomfort, syncope, increased abdominal girth or lower extremity swelling.  He has not had orthopnea or PND.  He has not had any bleeding issues such as melena, hematochezia or hematuria.  He has not had fevers, chills, cough.  He does note significant fatigue.  Past Medical History:  Diagnosis Date  . Acute on chronic systolic CHF (congestive heart failure) ([place]) 02/09/2017  . Acute respiratory failure with hypoxia ([place])   . AKI (acute kidney injury) ([place]) 08/20/2015  . Altered mental status 02/21/2017  . Aortic stenosis    a. mod-sev by echo 05/2016.  . Asthma   . Asthma exacerbation 08/20/2015  . Atrial fibrillation (HCC)    Paroxysmal; Echo in 2007-normal EF; mild LVH; LA enlargement; mild AS, minimal AI; neg. stress nuclear study in 2008   . BPH (benign prostatic hypertrophy) with urinary obstruction 09/27/2011   Transurethral resection of the prostate in 09/2011   . CAD (coronary artery  disease), native coronary artery 06/28/2016  . Cardiogenic shock ([place])   . Carotid stenosis 04/25/2012  . Cerebrovascular disease 07/2010   TIA; carotid ultrasound in 07/2010-significant bilateral plaque without focal internal carotid artery stenosis; MRI -encephalomalacia left temporal and right temporal lobes; small inferior right cerebellar infarct; small vessel disease  . Chronic atrial fibrillation (HCC)    Paroxysmal; Echocardiogram in 2007-normal EF; mild LVH; left atrial enlargement; mild stenosis and minimal AI; negative stress  nuclear study in 2008  . Chronic systolic CHF (congestive heart failure) ([place])    a. dx 05/2016 - EF 35-40%, diffuse HK, mod-severe AS, mod gradient, severe AVA VTI likely due to decreased cardiac output in setting of systolic dysfsunction and significant mitral regurgitation, mild MR, mod-severe MR, severe LAE, mild-mod RV dilation, mild RAE, mild-mod TR, mod PASP 30mHg.  . CKD (chronic kidney disease), stage II   . Cognitive dysfunction 05/29/2015   I believe that this is related to his age as well as previous stroke   . Congestive heart failure (CHF) ([facility] [place] 02/09/2017  . Degenerative joint disease    feet and legs  . Diabetes mellitus    no insulin; A1c of 6.6 in 2005  . Dizziness    occurs daily,especially in am  . Elevated troponin   . Exertional dyspnea   . Gastroesophageal reflux disease   . Hepatic steatosis   . History of noncompliance with medical treatment   . Hyperlipidemia    adverse reactions to statins and niacin  . Hypertension    Borderline  . Irregular heartbeat   . Mitral regurgitation    a. mod-sev by echo 05/2016  . Peripheral vascular disease ([place]   . Renal insufficiency   . S/P TAVR (transcatheter aortic valve replacement) 07/19/2016   29 mm Edwards Sapien 3 transcatheter heart valve placed via left percutaneous transfemoral approach  . Senile purpura ([facility] 08/15/2018  . Temporal arteritis ([facility] [place]   . Tricuspid regurgitation    a. mild-mod TR by echo 05/2016  . VT (ventricular tachycardia) (HCC)     Current Medications: Current Meds  Medication Sig  . albuterol (PROVENTIL) (2.5 MG/3ML) 0.083% nebulizer solution Take 3 mLs (2.5 mg total) by nebulization every 6 (six) hours as needed for wheezing or shortness of breath.  . bisoprolol (ZEBETA) 5 MG tablet TAKE (1/2) TABLET BY MOUTH ONCE DAILY.  . blood glucose meter kit and supplies Dispense based on patient and insurance preference. Use to test glucose three times daily.  (FOR ICD-10)  . insulin glargine (LANTUS  SOLOSTAR) 100 UNIT/ML Solostar Pen INJECT 32 UNITS EVERY EVENING.  . metFORMIN (GLUCOPHAGE-XR) 500 MG 24 hr tablet TAKE 1 TABLET BY MOUTH ONCE DAILY WITH BREAKFAST.  . mupirocin ointment (BACTROBAN) 2 % SMARTSIG:1 Application Topical 2-3 Times Daily  . nortriptyline (PAMELOR) 10 MG capsule TAKE 1 OR 2 CAPSULES AT BEDTIME FOR BURNING IN FEET.[28 IN PACKS/ NONE IN BOTTLE]  . potassium chloride SA (KLOR-CON) 20 MEQ tablet TAKE 1 TABLET BY MOUTH ON MONDAY, WEDNESDAY AND FRIDAY.  . rosuvastatin (CRESTOR) 5 MG tablet TAKE (1) TABLET BY MOUTH ONCE DAILY AT BEDTIME.  .Marland KitchenTRUEPLUS PEN NEEDLES 31G X 8 MM MISC USE WITH LANTUS SOLOSTAR EVERY EVENING  . warfarin (COUMADIN) 6 MG tablet TAKE 1 TABLET AS DIRECTED.     Allergies:   Ambien [zolpidem tartrate], Lipitor [atorvastatin calcium], Ranitidine, Simvastatin, Xanax xr [alprazolam er], Cholestatin, and Clopidogrel bisulfate   Social History   Tobacco Use  . Smoking status: Former Smoker  Packs/day: 1.00    Years: 20.00    Pack years: 20.00    Types: Cigarettes    Start date: 07/04/1950    Quit date: 04/25/1992    Years since quitting: 28.0  . Smokeless tobacco: Current User    Types: Chew  Substance Use Topics  . Alcohol use: No    Alcohol/week: 0.0 standard drinks  . Drug use: No     Family Hx: The patient's family history includes Diabetes in his father; Stroke in his mother. There is no history of Colon cancer.  Review of Systems  Constitutional: Negative for chills and fever.  Respiratory: Positive for shortness of breath. Negative for cough.   Gastrointestinal: Negative for hematochezia and melena.  Genitourinary: Negative for hematuria.  All other systems reviewed and are negative.    EKGs/Labs/Other Test Reviewed:    EKG:  EKG is  ordered today.  The ekg ordered today demonstrates atrial fibrillation, HR 80, left bundle branch block, no change from prior tracing  Recent Labs: 01/24/2020: ALT 23 02/13/2020: Hemoglobin 14.3;  Platelets 197 04/01/2020: NT-Pro BNP 985 04/16/2020: BUN 21; Creatinine, Ser 1.38; Potassium 4.8; Sodium 137   Recent Lipid Panel Lab Results  Component Value Date/Time   CHOL 137 01/24/2020 09:00 AM   TRIG 147 01/24/2020 09:00 AM   HDL 37 (L) 01/24/2020 09:00 AM   CHOLHDL 4.3 07/23/2019 02:52 PM   CHOLHDL 5.9 08/01/2014 10:03 AM   LDLCALC 74 01/24/2020 09:00 AM    Physical Exam:    VS:  BP (!) 90/40   Pulse 80   Ht _0  (1.854 m)   SpO2 96%   BMI 31.14 kg/m     Wt Readings from Last 3 Encounters:  04/01/20 236 lb (107 kg)  03/03/20 240 lb (108.9 kg)  02/13/20 240 lb (108.9 kg)     Constitutional:      Appearance: Not in distress. Chronically ill-appearing.  Neck:     Vascular: JVD normal.     Lymphadenopathy: No cervical adenopathy.  Pulmonary:     Breath sounds: No wheezing. No rales.  Cardiovascular:     Irregularly irregular rhythm. Normal S1. Normal S2.     Murmurs: There is no murmur.  Edema:    Peripheral edema absent.  Abdominal:     Palpations: Abdomen is soft. There is no hepatomegaly.  Skin:    General: Skin is warm and dry.  Neurological:     General: No focal deficit present.     Mental Status: Alert and oriented to person, place and time.     Cranial Nerves: Cranial nerves are intact.  Psychiatric:        Mood and Affect: Affect normal.      ASSESSMENT & PLAN:    1. Shortness of breath 2. Chronic systolic heart failure ([place]) 3. Coronary artery disease involving native coronary artery of native heart without angina pectoris 4. Pulmonary fibrosis ([place]) The patient presents for follow-up today.  His shortness of breath has gotten worse since he was last seen.  He sometimes seems to be short of breath at rest.  However, at other times, he seems to be breathing well at rest.  His exam is not suggestive of volume excess.  He did have a chest x-ray with fibrotic changes in the bases couple of months ago.  However, he had recent ambulatory O2 sats  obtained which were normal.  He also describes significant weakness.  Question if he may be anemic.  He is not  having chest discomfort.  However, he did have moderate diffuse disease at cardiac catheterization in 2017 prior to his TAVR.  Etiology for his shortness of breath is not entirely clear.  Question if this is all related to congestive heart failure versus progressive coronary artery disease versus pulmonary disease.  I discussed potential work-up with the patient to include cardiac catheterization, lab work and referral to pulmonology.  I also discussed this with Dr. Radford Pax (attending MD) who agreed.  Risks and benefits of cardiac catheterization have been discussed with the patient.  These include bleeding, infection, kidney damage, stroke, heart attack, death.  The patient understands these risks and is willing to proceed.   -Arrange right and left heart catheterization next week  -Obtain CBC, BMET, INR  -Refer to pulmonology  5. S/P TAVR (transcatheter aortic valve replacement) Recent echocardiogram with stable aortic valve gradients and no AI.  Continue SBE prophylaxis.  6. Permanent atrial fibrillation (HCC) Heart rate is well controlled.  He will need his warfarin held for his cardiac catheterization.  The patient denies any history of stroke stroke in the past.  However, there is a head CT in his chart from March 2018 which demonstrates stable chronic infarcts in the bilateral temporal lobes, cerebellar hemispheres and basal ganglia.  I will review with our pharmacy.  I suspect he will need Lovenox coverage while he is off of warfarin for cardiac catheterization.   Dispo:  Return in about 2 weeks (around 05/18/2020) for Post Procedure Follow Up, w/ [name], or Richardson Dopp, PA-C, in person.   Medication Adjustments/Labs and Tests Ordered: Current medicines are reviewed at length with the patient today.  Concerns regarding medicines are outlined above.  Tests Ordered: Orders Placed  This Encounter  Procedures  . Basic metabolic panel  . CBC  . Protime-INR  . Ambulatory referral to Pulmonology  . EKG 12-Lead   Medication Changes: No orders of the defined types were placed in this encounter.   Signed, Richardson Dopp, PA-C  05/04/2020 5:19 PM    [facility] [place], [place], [place]  [address] Phone: [phone]; Fax: [phone]

## 2020-05-04 ENCOUNTER — Other Ambulatory Visit: Payer: Self-pay

## 2020-05-04 ENCOUNTER — Encounter: Payer: Self-pay | Admitting: Physician Assistant

## 2020-05-04 ENCOUNTER — Ambulatory Visit: Payer: PPO | Admitting: Physician Assistant

## 2020-05-04 VITALS — BP 90/40 | HR 80 | Ht 73.0 in

## 2020-05-04 DIAGNOSIS — I5022 Chronic systolic (congestive) heart failure: Secondary | ICD-10-CM

## 2020-05-04 DIAGNOSIS — J8410 Pulmonary fibrosis, unspecified: Secondary | ICD-10-CM

## 2020-05-04 DIAGNOSIS — Z952 Presence of prosthetic heart valve: Secondary | ICD-10-CM | POA: Diagnosis not present

## 2020-05-04 DIAGNOSIS — R0602 Shortness of breath: Secondary | ICD-10-CM | POA: Diagnosis not present

## 2020-05-04 DIAGNOSIS — I2510 Atherosclerotic heart disease of native coronary artery without angina pectoris: Secondary | ICD-10-CM | POA: Diagnosis not present

## 2020-05-04 DIAGNOSIS — I4821 Permanent atrial fibrillation: Secondary | ICD-10-CM

## 2020-05-04 DIAGNOSIS — I10 Essential (primary) hypertension: Secondary | ICD-10-CM | POA: Diagnosis not present

## 2020-05-04 LAB — CBC
Hematocrit: 44 % (ref 37.5–51.0)
Hemoglobin: 14.7 g/dL (ref 13.0–17.7)
MCH: 29.5 pg (ref 26.6–33.0)
MCHC: 33.4 g/dL (ref 31.5–35.7)
MCV: 88 fL (ref 79–97)
Platelets: 226 10*3/uL (ref 150–450)
RBC: 4.98 x10E6/uL (ref 4.14–5.80)
RDW: 16.1 % — ABNORMAL HIGH (ref 11.6–15.4)
WBC: 6.6 10*3/uL (ref 3.4–10.8)

## 2020-05-04 LAB — BASIC METABOLIC PANEL
BUN/Creatinine Ratio: 17 (ref 10–24)
BUN: 25 mg/dL (ref 8–27)
CO2: 25 mmol/L (ref 20–29)
Calcium: 10 mg/dL (ref 8.6–10.2)
Chloride: 102 mmol/L (ref 96–106)
Creatinine, Ser: 1.45 mg/dL — ABNORMAL HIGH (ref 0.76–1.27)
GFR calc Af Amer: 50 mL/min/{1.73_m2} — ABNORMAL LOW (ref >59)
GFR calc non Af Amer: 44 mL/min/{1.73_m2} — ABNORMAL LOW (ref >59)
Glucose: 134 mg/dL — ABNORMAL HIGH (ref 65–99)
Potassium: 5 mmol/L (ref 3.5–5.2)
Sodium: 136 mmol/L (ref 134–144)

## 2020-05-04 LAB — PROTIME-INR
INR: 2.3 — ABNORMAL HIGH (ref 0.9–1.2)
Prothrombin Time: 24.2 s — ABNORMAL HIGH (ref 9.1–12.0)

## 2020-05-04 NOTE — Patient Instructions (Signed)
Medication Instructions:   Your physician recommends that you continue on your current medications as directed. Please refer to the Current Medication list given to you today.  *If you need a refill on your cardiac medications before your next appointment, please call your pharmacy*  Lab Work:  You will have labs drawn today: BMET/CBC/INR  If you have labs (blood work) drawn today and your tests are completely normal, you will receive your results only by: [name] MyChart Message (if you have MyChart) OR . A paper copy in the mail If you have any lab test that is abnormal or we need to change your treatment, we will call you to review the results.  Testing/Procedures:  Your physician has requested that you have a cardiac catheterization. Cardiac catheterization is used to diagnose and/or treat various heart conditions. Doctors may recommend this procedure for a number of different reasons. The most common reason is to evaluate chest pain. Chest pain can be a symptom of coronary artery disease (CAD), and cardiac catheterization can show whether plaque is narrowing or blocking your heart's arteries. This procedure is also used to evaluate the valves, as well as measure the blood flow and oxygen levels in different parts of your heart. For further information please visit [email or web address]. Please follow instruction sheet, as given.  Follow-Up:  On 05/27/20 at 2:15PM with Richardson Dopp, PA-C

## 2020-05-05 ENCOUNTER — Telehealth: Payer: Self-pay | Admitting: Pharmacist

## 2020-05-05 ENCOUNTER — Telehealth: Payer: Self-pay | Admitting: *Deleted

## 2020-05-05 MED ORDER — ENOXAPARIN SODIUM 150 MG/ML ~~LOC~~ SOLN: 150.0000 mg | SUBCUTANEOUS | 0 refills | Status: DC

## 2020-05-05 NOTE — Telephone Encounter (Signed)
Please contact pt nephew and schedule nurse visit INR for 05/15/20. Thank you!

## 2020-05-05 NOTE — Telephone Encounter (Signed)
I spoke to patients nephew about Lovenox bridge. I offered appointment to come to clinic to discuss but he and niece (on [place]) stated they were comfortable with injections. I reviewed the instructions with them over the phone. Will also e-mail nephew the instructions. He will need his INR checked sooner than 7/20 so that he can stop Lovenox injections. Ideally it should be check on 7/2 due to the holiday weekend. If not, needs to be checked on 7/6 at the very latest (I assume PCP office closed 7/5). I will send PCP office a message to see if they can fit the patient in.   6/23: Last dose of Coumadin.  6/24: No Coumadin or Lovenox.  6/25: Inject Lovenox 150mg  in the fatty abdominal tissue at least 2 inches from the belly button at 7 PM. No Coumadin.  6/26: Inject Lovenox in the fatty tissue at 7PM.  No Coumadin.  6/27: No lovenox, No Coumadin.  6/28: Procedure Day - No Lovenox - Resume Coumadin in the evening or as directed by doctor (take an extra half tablet with usual dose for 2 days then resume normal dose).  6/29: Resume Lovenox inject in the fatty tissue every 24 hours and take Coumadin.  6/30: Inject Lovenox in the fatty tissue every 24 hours and take Coumadin.  7/1: Inject Lovenox in the fatty tissue every 24 hours and take Coumadin.  7/2: Coumadin appt to check INR.

## 2020-05-05 NOTE — Telephone Encounter (Signed)
Please advise. Thank you

## 2020-05-05 NOTE — Telephone Encounter (Signed)
Nurses-please schedule patient for INR on July 2. This result must be shown to me while patient is present in our office. We will also notify cardiology office of this

## 2020-05-05 NOTE — Telephone Encounter (Signed)
Error

## 2020-05-06 DIAGNOSIS — C44329 Squamous cell carcinoma of skin of other parts of face: Secondary | ICD-10-CM | POA: Diagnosis not present

## 2020-05-06 DIAGNOSIS — Z08 Encounter for follow-up examination after completed treatment for malignant neoplasm: Secondary | ICD-10-CM | POA: Diagnosis not present

## 2020-05-06 DIAGNOSIS — X32XXXD Exposure to sunlight, subsequent encounter: Secondary | ICD-10-CM | POA: Diagnosis not present

## 2020-05-06 DIAGNOSIS — Z85828 Personal history of other malignant neoplasm of skin: Secondary | ICD-10-CM | POA: Diagnosis not present

## 2020-05-06 DIAGNOSIS — L570 Actinic keratosis: Secondary | ICD-10-CM | POA: Diagnosis not present

## 2020-05-06 NOTE — Addendum Note (Signed)
Addended byKathlen Mody, Nicki Reaper T on: 05/06/2020 03:01 PM   Modules accepted: Orders, SmartSet

## 2020-05-07 ENCOUNTER — Telehealth: Payer: Self-pay | Admitting: *Deleted

## 2020-05-07 NOTE — Telephone Encounter (Deleted)
Pt contacted pre-catheterization scheduled at [facility] for: Monday May 11, 2020 10:30 AM  Verified arrival time and place: [facility]) at: 5:30 AM-pre procedure hydration   No solid food after midnight prior to cath, clear liquids until 5 AM day of procedure. Contrast allergy:  DO NOT TAKE ANY DIURETICS THE MORNING OF THE PROCEDURE. DO NOT TAKE ANY  DIABETES MEDICATIONS THE MORNING OF THE PROCEDURE.  AM meds can be  taken pre-cath with sips of water including: ASA 81 mg   Confirmed patient has responsible adult to drive home post procedure and observe 24 hours after arriving home:   You are allowed ONE visitor in the waiting room during your procedure. Both you and your visitor must wear masks.      COVID-19 Pre-Screening Questions:  . In the past 7 to 10 days have you had a new cough, shortness of breath, headache, congestion, fever (100 or greater) unexplained body aches, new sore throat, or sudden loss of taste or sense of smell? [name] In the past 7 to 10 days have you been around anyone with known Covid 19?

## 2020-05-07 NOTE — Telephone Encounter (Signed)
Duplicate/erroneous encounter

## 2020-05-07 NOTE — Telephone Encounter (Addendum)
Pt contacted pre-catheterization scheduled at [facility] for: Monday May 11, 2020 10:30 AM Verified arrival time and place: [facility]) at: 5:30 AM-pre-procedure hydration  Reviewed with Richardson Dopp, PA-C   No solid food after midnight prior to cath, clear liquids until 5 AM day of procedure.  Hold: Coumadin-last dose 05/06/20 until post procedure See 05/05/20 phone note Marcelle Overlie, [facility] for detailed coumadin/lovenox instructions.  Per Diane-pt took last dose of Lasix/KCl 05/06/20. See BMP 05/04/20.  Metformin-none day of procedure and 48 hours post procedure. Insulin-1/2 usual PM dose prior to procedure Per CMA -pt does not take AM Insulin.   Except hold medications AM meds can be  taken pre-cath with sips of water including: ASA 81 mg   Confirmed patient has responsible adult to drive home post procedure and observe 24 hours after arriving home: yes  You are allowed ONE visitor in the waiting room during your procedure. Both you and your visitor must wear masks.      COVID-19 Pre-Screening Questions:  . In the past 7 to 10 days have you had a new cough, shortness of breath, headache, congestion, fever (100 or greater) unexplained body aches, new sore throat, or sudden loss of taste or sense of smell? no . In the past 7 to 10 days have you been around anyone with known Covid 19? no   Reviewed procedure/mask/visitor instructions, pre-procedure hydration, COVID-19 screening questions with patient's nephew (DPR), Hollace Hayward.  Richardson Landry asked me to call CMA, Shauna Hugh [phone], that manages medications for patient to discuss specific pre-procedure  medication instructions with her. I spoke with Diane, reviewed medication instructions, including pharmacist instructions for coumadin and lovenox.

## 2020-05-11 ENCOUNTER — Inpatient Hospital Stay (HOSPITAL_COMMUNITY): Payer: PPO

## 2020-05-11 ENCOUNTER — Inpatient Hospital Stay (HOSPITAL_COMMUNITY)
Admission: AD | Admit: 2020-05-11 | Discharge: 2020-05-14 | DRG: 286 | Disposition: A | Discharge status: Skilled Nursing Facility | Payer: PPO | Attending: Internal Medicine | Admitting: Internal Medicine

## 2020-05-11 ENCOUNTER — Encounter (HOSPITAL_COMMUNITY)
Admission: AD | Disposition: A | Discharge status: Skilled Nursing Facility | Payer: Self-pay | Source: Home / Self Care | Attending: Internal Medicine

## 2020-05-11 ENCOUNTER — Inpatient Hospital Stay: Payer: Self-pay

## 2020-05-11 ENCOUNTER — Other Ambulatory Visit: Payer: Self-pay

## 2020-05-11 DIAGNOSIS — J45909 Unspecified asthma, uncomplicated: Secondary | ICD-10-CM | POA: Diagnosis present

## 2020-05-11 DIAGNOSIS — I2510 Atherosclerotic heart disease of native coronary artery without angina pectoris: Secondary | ICD-10-CM | POA: Diagnosis not present

## 2020-05-11 DIAGNOSIS — N401 Enlarged prostate with lower urinary tract symptoms: Secondary | ICD-10-CM | POA: Diagnosis not present

## 2020-05-11 DIAGNOSIS — E785 Hyperlipidemia, unspecified: Secondary | ICD-10-CM | POA: Diagnosis present

## 2020-05-11 DIAGNOSIS — F0390 Unspecified dementia without behavioral disturbance: Secondary | ICD-10-CM | POA: Diagnosis present

## 2020-05-11 DIAGNOSIS — I130 Hypertensive heart and chronic kidney disease with heart failure and stage 1 through stage 4 chronic kidney disease, or unspecified chronic kidney disease: Principal | ICD-10-CM | POA: Diagnosis present

## 2020-05-11 DIAGNOSIS — E1142 Type 2 diabetes mellitus with diabetic polyneuropathy: Secondary | ICD-10-CM | POA: Diagnosis not present

## 2020-05-11 DIAGNOSIS — I428 Other cardiomyopathies: Secondary | ICD-10-CM | POA: Diagnosis present

## 2020-05-11 DIAGNOSIS — I69898 Other sequelae of other cerebrovascular disease: Secondary | ICD-10-CM

## 2020-05-11 DIAGNOSIS — I739 Peripheral vascular disease, unspecified: Secondary | ICD-10-CM | POA: Diagnosis not present

## 2020-05-11 DIAGNOSIS — I25119 Atherosclerotic heart disease of native coronary artery with unspecified angina pectoris: Secondary | ICD-10-CM | POA: Diagnosis present

## 2020-05-11 DIAGNOSIS — I5023 Acute on chronic systolic (congestive) heart failure: Secondary | ICD-10-CM | POA: Diagnosis present

## 2020-05-11 DIAGNOSIS — J90 Pleural effusion, not elsewhere classified: Secondary | ICD-10-CM | POA: Diagnosis not present

## 2020-05-11 DIAGNOSIS — I6522 Occlusion and stenosis of left carotid artery: Secondary | ICD-10-CM | POA: Diagnosis present

## 2020-05-11 DIAGNOSIS — E1122 Type 2 diabetes mellitus with diabetic chronic kidney disease: Secondary | ICD-10-CM | POA: Diagnosis not present

## 2020-05-11 DIAGNOSIS — R262 Difficulty in walking, not elsewhere classified: Secondary | ICD-10-CM | POA: Diagnosis not present

## 2020-05-11 DIAGNOSIS — R419 Unspecified symptoms and signs involving cognitive functions and awareness: Secondary | ICD-10-CM | POA: Diagnosis present

## 2020-05-11 DIAGNOSIS — E119 Type 2 diabetes mellitus without complications: Secondary | ICD-10-CM | POA: Diagnosis not present

## 2020-05-11 DIAGNOSIS — R279 Unspecified lack of coordination: Secondary | ICD-10-CM | POA: Diagnosis not present

## 2020-05-11 DIAGNOSIS — N1832 Chronic kidney disease, stage 3b: Secondary | ICD-10-CM | POA: Diagnosis not present

## 2020-05-11 DIAGNOSIS — F5104 Psychophysiologic insomnia: Secondary | ICD-10-CM | POA: Diagnosis not present

## 2020-05-11 DIAGNOSIS — Z20822 Contact with and (suspected) exposure to covid-19: Secondary | ICD-10-CM | POA: Diagnosis present

## 2020-05-11 DIAGNOSIS — I4821 Permanent atrial fibrillation: Secondary | ICD-10-CM | POA: Diagnosis not present

## 2020-05-11 DIAGNOSIS — Z823 Family history of stroke: Secondary | ICD-10-CM

## 2020-05-11 DIAGNOSIS — R0602 Shortness of breath: Secondary | ICD-10-CM

## 2020-05-11 DIAGNOSIS — M159 Polyosteoarthritis, unspecified: Secondary | ICD-10-CM | POA: Diagnosis not present

## 2020-05-11 DIAGNOSIS — N179 Acute kidney failure, unspecified: Secondary | ICD-10-CM | POA: Diagnosis present

## 2020-05-11 DIAGNOSIS — Z7901 Long term (current) use of anticoagulants: Secondary | ICD-10-CM | POA: Diagnosis not present

## 2020-05-11 DIAGNOSIS — Z79899 Other long term (current) drug therapy: Secondary | ICD-10-CM

## 2020-05-11 DIAGNOSIS — I10 Essential (primary) hypertension: Secondary | ICD-10-CM | POA: Diagnosis not present

## 2020-05-11 DIAGNOSIS — E1169 Type 2 diabetes mellitus with other specified complication: Secondary | ICD-10-CM | POA: Diagnosis not present

## 2020-05-11 DIAGNOSIS — Z794 Long term (current) use of insulin: Secondary | ICD-10-CM

## 2020-05-11 DIAGNOSIS — R5381 Other malaise: Secondary | ICD-10-CM | POA: Diagnosis not present

## 2020-05-11 DIAGNOSIS — I447 Left bundle-branch block, unspecified: Secondary | ICD-10-CM | POA: Diagnosis not present

## 2020-05-11 DIAGNOSIS — Z833 Family history of diabetes mellitus: Secondary | ICD-10-CM | POA: Diagnosis not present

## 2020-05-11 DIAGNOSIS — Z741 Need for assistance with personal care: Secondary | ICD-10-CM | POA: Diagnosis not present

## 2020-05-11 DIAGNOSIS — E1121 Type 2 diabetes mellitus with diabetic nephropathy: Secondary | ICD-10-CM | POA: Diagnosis not present

## 2020-05-11 DIAGNOSIS — I4811 Longstanding persistent atrial fibrillation: Secondary | ICD-10-CM | POA: Diagnosis not present

## 2020-05-11 DIAGNOSIS — J8410 Pulmonary fibrosis, unspecified: Secondary | ICD-10-CM | POA: Diagnosis not present

## 2020-05-11 DIAGNOSIS — E1151 Type 2 diabetes mellitus with diabetic peripheral angiopathy without gangrene: Secondary | ICD-10-CM | POA: Diagnosis not present

## 2020-05-11 DIAGNOSIS — F17220 Nicotine dependence, chewing tobacco, uncomplicated: Secondary | ICD-10-CM | POA: Diagnosis present

## 2020-05-11 DIAGNOSIS — M2550 Pain in unspecified joint: Secondary | ICD-10-CM | POA: Diagnosis not present

## 2020-05-11 DIAGNOSIS — I5041 Acute combined systolic (congestive) and diastolic (congestive) heart failure: Secondary | ICD-10-CM | POA: Diagnosis not present

## 2020-05-11 DIAGNOSIS — Z953 Presence of xenogenic heart valve: Secondary | ICD-10-CM | POA: Diagnosis not present

## 2020-05-11 DIAGNOSIS — J4520 Mild intermittent asthma, uncomplicated: Secondary | ICD-10-CM | POA: Diagnosis not present

## 2020-05-11 DIAGNOSIS — I340 Nonrheumatic mitral (valve) insufficiency: Secondary | ICD-10-CM | POA: Diagnosis not present

## 2020-05-11 DIAGNOSIS — Z452 Encounter for adjustment and management of vascular access device: Secondary | ICD-10-CM

## 2020-05-11 DIAGNOSIS — I5022 Chronic systolic (congestive) heart failure: Secondary | ICD-10-CM

## 2020-05-11 DIAGNOSIS — R41841 Cognitive communication deficit: Secondary | ICD-10-CM | POA: Diagnosis not present

## 2020-05-11 DIAGNOSIS — M6281 Muscle weakness (generalized): Secondary | ICD-10-CM | POA: Diagnosis not present

## 2020-05-11 DIAGNOSIS — Z7401 Bed confinement status: Secondary | ICD-10-CM | POA: Diagnosis not present

## 2020-05-11 DIAGNOSIS — K219 Gastro-esophageal reflux disease without esophagitis: Secondary | ICD-10-CM | POA: Diagnosis not present

## 2020-05-11 HISTORY — PX: RIGHT HEART CATH AND CORONARY ANGIOGRAPHY: CATH118264

## 2020-05-11 LAB — GLUCOSE, CAPILLARY
Glucose-Capillary: 141 mg/dL — ABNORMAL HIGH (ref 70–99)
Glucose-Capillary: 160 mg/dL — ABNORMAL HIGH (ref 70–99)
Glucose-Capillary: 195 mg/dL — ABNORMAL HIGH (ref 70–99)

## 2020-05-11 LAB — POCT I-STAT EG7
Acid-Base Excess: 1 mmol/L (ref 0.0–2.0)
Bicarbonate: 27.2 mmol/L (ref 20.0–28.0)
Calcium, Ion: 1.23 mmol/L (ref 1.15–1.40)
HCT: 42 % (ref 39.0–52.0)
Hemoglobin: 14.3 g/dL (ref 13.0–17.0)
O2 Saturation: 55 %
Potassium: 4.4 mmol/L (ref 3.5–5.1)
Sodium: 141 mmol/L (ref 135–145)
TCO2: 29 mmol/L (ref 22–32)
pCO2, Ven: 48.5 mmHg (ref 44.0–60.0)
pH, Ven: 7.357 (ref 7.250–7.430)
pO2, Ven: 31 mmHg — CL (ref 32.0–45.0)

## 2020-05-11 LAB — POCT I-STAT 7, (LYTES, BLD GAS, ICA,H+H)
Acid-base deficit: 1 mmol/L (ref 0.0–2.0)
Bicarbonate: 23.9 mmol/L (ref 20.0–28.0)
Calcium, Ion: 1.19 mmol/L (ref 1.15–1.40)
HCT: 41 % (ref 39.0–52.0)
Hemoglobin: 13.9 g/dL (ref 13.0–17.0)
O2 Saturation: 98 %
Potassium: 4.3 mmol/L (ref 3.5–5.1)
Sodium: 138 mmol/L (ref 135–145)
TCO2: 25 mmol/L (ref 22–32)
pCO2 arterial: 39.5 mmHg (ref 32.0–48.0)
pH, Arterial: 7.39 (ref 7.350–7.450)
pO2, Arterial: 116 mmHg — ABNORMAL HIGH (ref 83.0–108.0)

## 2020-05-11 LAB — BRAIN NATRIURETIC PEPTIDE: B Natriuretic Peptide: 154.4 pg/mL — ABNORMAL HIGH (ref 0.0–100.0)

## 2020-05-11 LAB — PROTIME-INR
INR: 1.1 (ref 0.8–1.2)
Prothrombin Time: 13.8 seconds (ref 11.4–15.2)

## 2020-05-11 LAB — HEMOGLOBIN A1C
Hgb A1c MFr Bld: 7.8 % — ABNORMAL HIGH (ref 4.8–5.6)
Mean Plasma Glucose: 177.16 mg/dL

## 2020-05-11 LAB — COOXEMETRY PANEL
Carboxyhemoglobin: 2.1 % — ABNORMAL HIGH (ref 0.5–1.5)
Methemoglobin: 0.9 % (ref 0.0–1.5)
O2 Saturation: 80.4 %
Total hemoglobin: 14.3 g/dL (ref 12.0–16.0)

## 2020-05-11 SURGERY — RIGHT HEART CATH AND CORONARY ANGIOGRAPHY
Anesthesia: LOCAL

## 2020-05-11 MED ORDER — HEPARIN (PORCINE) 25000 UT/250ML-% IV SOLN
1300.0000 [IU]/h | INTRAVENOUS | Status: DC
  Administered 2020-05-11 – 2020-05-12 (×2): 1200 [IU]/h via INTRAVENOUS
  Administered 2020-05-13 – 2020-05-14 (×2): 1300 [IU]/h via INTRAVENOUS
  Filled 2020-05-11 (×4): qty 250

## 2020-05-11 MED ORDER — HEPARIN (PORCINE) IN NACL 1000-0.9 UT/500ML-% IV SOLN
INTRAVENOUS | Status: DC | PRN
  Administered 2020-05-11 (×2): 500 mL

## 2020-05-11 MED ORDER — SODIUM CHLORIDE 0.9 % IV SOLN: INTRAVENOUS | Status: DC

## 2020-05-11 MED ORDER — MELATONIN 3 MG PO TABS
9.0000 mg | ORAL_TABLET | Freq: Every day | ORAL | Status: DC
  Administered 2020-05-11 – 2020-05-13 (×3): 9 mg via ORAL
  Filled 2020-05-11 (×4): qty 3

## 2020-05-11 MED ORDER — FUROSEMIDE 10 MG/ML IJ SOLN
80.0000 mg | Freq: Two times a day (BID) | INTRAMUSCULAR | Status: DC
  Administered 2020-05-11 – 2020-05-13 (×4): 80 mg via INTRAVENOUS
  Filled 2020-05-11 (×3): qty 8

## 2020-05-11 MED ORDER — CHLORHEXIDINE GLUCONATE CLOTH 2 % EX PADS
6.0000 | MEDICATED_PAD | Freq: Every day | CUTANEOUS | Status: DC
  Administered 2020-05-12 – 2020-05-14 (×3): 6 via TOPICAL

## 2020-05-11 MED ORDER — IPRATROPIUM-ALBUTEROL 0.5-2.5 (3) MG/3ML IN SOLN
RESPIRATORY_TRACT | Status: AC
  Filled 2020-05-11: qty 3

## 2020-05-11 MED ORDER — NORTRIPTYLINE HCL 10 MG PO CAPS
10.0000 mg | ORAL_CAPSULE | Freq: Every day | ORAL | Status: DC
  Administered 2020-05-11 – 2020-05-13 (×3): 10 mg via ORAL
  Filled 2020-05-11 (×4): qty 1

## 2020-05-11 MED ORDER — SODIUM CHLORIDE 0.9% FLUSH
3.0000 mL | Freq: Two times a day (BID) | INTRAVENOUS | Status: DC
  Administered 2020-05-12: 3 mL via INTRAVENOUS

## 2020-05-11 MED ORDER — LIDOCAINE HCL (PF) 1 % IJ SOLN
INTRAMUSCULAR | Status: DC | PRN
  Administered 2020-05-11 (×2): 2 mL via INTRADERMAL

## 2020-05-11 MED ORDER — VERAPAMIL HCL 2.5 MG/ML IV SOLN
INTRAVENOUS | Status: DC | PRN
  Administered 2020-05-11: 10 mL via INTRA_ARTERIAL

## 2020-05-11 MED ORDER — FUROSEMIDE 10 MG/ML IJ SOLN
INTRAMUSCULAR | Status: AC
  Filled 2020-05-11: qty 8

## 2020-05-11 MED ORDER — ASPIRIN 81 MG PO CHEW: 81.0000 mg | CHEWABLE_TABLET | ORAL | Status: DC

## 2020-05-11 MED ORDER — IOHEXOL 350 MG/ML SOLN
INTRAVENOUS | Status: DC | PRN
  Administered 2020-05-11: 55 mL via INTRA_ARTERIAL

## 2020-05-11 MED ORDER — SODIUM CHLORIDE 0.9% FLUSH
3.0000 mL | INTRAVENOUS | Status: DC | PRN
  Administered 2020-05-11 – 2020-05-13 (×3): 3 mL via INTRAVENOUS

## 2020-05-11 MED ORDER — HEPARIN SODIUM (PORCINE) 1000 UNIT/ML IJ SOLN
INTRAMUSCULAR | Status: AC
  Filled 2020-05-11: qty 1

## 2020-05-11 MED ORDER — HEPARIN (PORCINE) IN NACL 1000-0.9 UT/500ML-% IV SOLN
INTRAVENOUS | Status: AC
  Filled 2020-05-11: qty 500

## 2020-05-11 MED ORDER — PSYLLIUM 95 % PO PACK
1.0000 | PACK | Freq: Every day | ORAL | Status: DC
  Administered 2020-05-12 – 2020-05-14 (×3): 1 via ORAL
  Filled 2020-05-11 (×3): qty 1

## 2020-05-11 MED ORDER — SODIUM CHLORIDE 0.9% FLUSH: 3.0000 mL | INTRAVENOUS | Status: DC | PRN

## 2020-05-11 MED ORDER — LEVALBUTEROL HCL 0.63 MG/3ML IN NEBU
0.6300 mg | INHALATION_SOLUTION | Freq: Four times a day (QID) | RESPIRATORY_TRACT | Status: DC | PRN
  Administered 2020-05-11: 0.63 mg via RESPIRATORY_TRACT
  Filled 2020-05-11 (×3): qty 3

## 2020-05-11 MED ORDER — SODIUM CHLORIDE 0.9% FLUSH
10.0000 mL | Freq: Two times a day (BID) | INTRAVENOUS | Status: DC
  Administered 2020-05-12 – 2020-05-14 (×3): 10 mL

## 2020-05-11 MED ORDER — LIDOCAINE HCL (PF) 1 % IJ SOLN
INTRAMUSCULAR | Status: AC
  Filled 2020-05-11: qty 30

## 2020-05-11 MED ORDER — SODIUM CHLORIDE 0.9 % IV SOLN: 250.0000 mL | INTRAVENOUS | Status: DC | PRN

## 2020-05-11 MED ORDER — SODIUM CHLORIDE 0.9% FLUSH
3.0000 mL | Freq: Two times a day (BID) | INTRAVENOUS | Status: DC
  Administered 2020-05-12 – 2020-05-13 (×3): 3 mL via INTRAVENOUS

## 2020-05-11 MED ORDER — ACETAMINOPHEN 325 MG PO TABS: 650.0000 mg | ORAL_TABLET | ORAL | Status: DC | PRN

## 2020-05-11 MED ORDER — HYDRALAZINE HCL 20 MG/ML IJ SOLN
10.0000 mg | INTRAMUSCULAR | Status: AC | PRN
  Administered 2020-05-11: 10 mg via INTRAVENOUS

## 2020-05-11 MED ORDER — ACETAMINOPHEN 500 MG PO TABS: 1000.0000 mg | ORAL_TABLET | Freq: Three times a day (TID) | ORAL | Status: DC | PRN

## 2020-05-11 MED ORDER — ACETAMINOPHEN 325 MG PO TABS
650.0000 mg | ORAL_TABLET | ORAL | Status: DC | PRN
Start: 2020-05-11 — End: 2020-05-11

## 2020-05-11 MED ORDER — HEPARIN SODIUM (PORCINE) 1000 UNIT/ML IJ SOLN
INTRAMUSCULAR | Status: DC | PRN
  Administered 2020-05-11: 5000 [IU] via INTRAVENOUS

## 2020-05-11 MED ORDER — ALBUTEROL SULFATE (2.5 MG/3ML) 0.083% IN NEBU
2.5000 mg | INHALATION_SOLUTION | Freq: Once | RESPIRATORY_TRACT | Status: DC
  Filled 2020-05-11: qty 3

## 2020-05-11 MED ORDER — SODIUM CHLORIDE 0.9% FLUSH: 10.0000 mL | INTRAVENOUS | Status: DC | PRN

## 2020-05-11 MED ORDER — INSULIN GLARGINE 100 UNIT/ML ~~LOC~~ SOLN
32.0000 [IU] | Freq: Every evening | SUBCUTANEOUS | Status: DC
  Administered 2020-05-11 – 2020-05-14 (×4): 32 [IU] via SUBCUTANEOUS
  Filled 2020-05-11 (×4): qty 0.32

## 2020-05-11 MED ORDER — ALBUTEROL SULFATE (2.5 MG/3ML) 0.083% IN NEBU: 2.5000 mg | INHALATION_SOLUTION | Freq: Four times a day (QID) | RESPIRATORY_TRACT | Status: DC | PRN

## 2020-05-11 MED ORDER — INSULIN ASPART 100 UNIT/ML ~~LOC~~ SOLN
0.0000 [IU] | Freq: Three times a day (TID) | SUBCUTANEOUS | Status: DC
  Administered 2020-05-12: 3 [IU] via SUBCUTANEOUS
  Administered 2020-05-12: 2 [IU] via SUBCUTANEOUS
  Administered 2020-05-12: 5 [IU] via SUBCUTANEOUS
  Administered 2020-05-13: 2 [IU] via SUBCUTANEOUS
  Administered 2020-05-13: 5 [IU] via SUBCUTANEOUS
  Administered 2020-05-13: 2 [IU] via SUBCUTANEOUS
  Administered 2020-05-14: 1 [IU] via SUBCUTANEOUS
  Administered 2020-05-14: 5 [IU] via SUBCUTANEOUS
  Administered 2020-05-14: 2 [IU] via SUBCUTANEOUS

## 2020-05-11 MED ORDER — MILRINONE LACTATE IN DEXTROSE 20-5 MG/100ML-% IV SOLN
0.1250 ug/kg/min | INTRAVENOUS | Status: DC
  Administered 2020-05-11 – 2020-05-12 (×2): 0.125 ug/kg/min via INTRAVENOUS
  Filled 2020-05-11 (×3): qty 100

## 2020-05-11 MED ORDER — HYDRALAZINE HCL 20 MG/ML IJ SOLN
INTRAMUSCULAR | Status: AC
  Filled 2020-05-11: qty 1

## 2020-05-11 MED ORDER — VERAPAMIL HCL 2.5 MG/ML IV SOLN
INTRAVENOUS | Status: AC
  Filled 2020-05-11: qty 2

## 2020-05-11 MED ORDER — ONDANSETRON HCL 4 MG/2ML IJ SOLN: 4.0000 mg | Freq: Four times a day (QID) | INTRAMUSCULAR | Status: DC | PRN

## 2020-05-11 MED ORDER — ROSUVASTATIN CALCIUM 5 MG PO TABS
5.0000 mg | ORAL_TABLET | Freq: Every day | ORAL | Status: DC
  Administered 2020-05-12 – 2020-05-14 (×3): 5 mg via ORAL
  Filled 2020-05-11 (×3): qty 1

## 2020-05-11 SURGICAL SUPPLY — 13 items
CATH 5FR JL3.5 JR4 ANG PIG MP (CATHETERS) ×1 IMPLANT
CATH SWAN GANZ 7F STRAIGHT (CATHETERS) ×1 IMPLANT
DEVICE RAD COMP TR BAND LRG (VASCULAR PRODUCTS) ×1 IMPLANT
ELECT DEFIB PAD ADLT CADENCE (PAD) ×1 IMPLANT
GLIDESHEATH SLEND SS 6F .021 (SHEATH) ×1 IMPLANT
GLIDESHEATH SLENDER 7FR .021G (SHEATH) ×1 IMPLANT
GUIDEWIRE .025 260CM (WIRE) ×1 IMPLANT
GUIDEWIRE INQWIRE 1.5J.035X260 (WIRE) IMPLANT
INQWIRE 1.5J .035X260CM (WIRE) ×2
KIT HEART LEFT (KITS) ×2 IMPLANT
PACK CARDIAC CATHETERIZATION (CUSTOM PROCEDURE TRAY) ×2 IMPLANT
TRANSDUCER W/STOPCOCK (MISCELLANEOUS) ×2 IMPLANT
TUBING CIL FLEX 10 FLL-RA (TUBING) ×2 IMPLANT

## 2020-05-11 NOTE — Progress Notes (Signed)
TR BAND REMOVAL  LOCATION:    Right radial  DEFLATED PER PROTOCOL:    Yes.    TIME BAND OFF / DRESSING APPLIED:    1630pm  Gauze and tegaderm with coban   SITE UPON ARRIVAL:    Level 0  SITE AFTER BAND REMOVAL:    Level 0  CIRCULATION SENSATION AND MOVEMENT:    Within Normal Limits   Yes.    COMMENTS:   Care instructions given to patient

## 2020-05-11 NOTE — Progress Notes (Signed)
Peripherally Inserted Central Catheter Placement  The IV Nurse has discussed with the patient and/or persons authorized to consent for the patient, the purpose of this procedure and the potential benefits and risks involved with this procedure.  The benefits include less needle sticks, lab draws from the catheter, and the patient may be discharged home with the catheter. Risks include, but not limited to, infection, bleeding, blood clot (thrombus formation), and puncture of an artery; nerve damage and irregular heartbeat and possibility to perform a PICC exchange if needed/ordered by physician.  Alternatives to this procedure were also discussed.  Bard Power PICC patient education guide, fact sheet on infection prevention and patient information card has been provided to patient /or left at bedside.  Phone consent obtain from Brenton Grills at patients request  PICC Placement Documentation  PICC Double Lumen 05/11/20 PICC Right Brachial 43 cm 0 cm (Active)  Indication for Insertion or Continuance of Line Vasoactive infusions 05/11/20 2059  Exposed Catheter (cm) 0 cm 05/11/20 2059  Site Assessment Dry;Clean;Intact 05/11/20 2059  Lumen #1 Status Flushed;Saline locked;Blood return noted 05/11/20 2059  Lumen #2 Status Flushed;Saline locked;Blood return noted 05/11/20 2059  Dressing Type Transparent 05/11/20 2059  Dressing Status Dry;Intact;Clean;Antimicrobial disc in place 05/11/20 2059  Safety Lock Not Applicable 41/74/08 1448  Dressing Intervention New dressing 05/11/20 2059  Dressing Change Due 05/18/20 05/11/20 2059       Gordan Payment 05/11/2020, 9:00 PM

## 2020-05-11 NOTE — Progress Notes (Signed)
   05/11/20 1900  Assess: MEWS Score  Temp (!) 97.5 F (36.4 C)  ECG Heart Rate (!) 113  SpO2 98 %  O2 Device Room Air  Assess: MEWS Score  MEWS Temp 0  MEWS Systolic 0  MEWS Pulse 2  MEWS RR 2  MEWS LOC 0  MEWS Score 4  MEWS Score Color Red  Assess: if the MEWS score is Yellow or Red  Were vital signs taken at a resting state? Yes  Focused Assessment Documented focused assessment  Early Detection of Sepsis Score *See Row Information* Medium  MEWS guidelines implemented *See Row Information* No, vital signs rechecked  Treat  MEWS Interventions Other (Comment)   Respiratory rate not posted and was 16-18 when checked. Patient in atrial fibrillation in rates 90-100's. Just received from cath lab and not settled in at the time rate was recorded. When checked rates remained in 90-100's range without change from previous monitoring in cath lab. No need to escalate. Discussed with charge nurse and oncoming RN.

## 2020-05-11 NOTE — H&P (Addendum)
Advanced Heart Failure Team History and Physical Note   PCP:  Kathyrn Drown, MD  PCP-Cardiology: Sherren Mocha, MD     Reason for Admission: Acute on chronic systolic heart failure w/ low output    HPI:    [name] [name] is an 86 year old male with h/o severe aortic stenosis, chronic atrial fibrillation, chronic a/c w/ coumadin, moderate CAD by cath in 2017, hypertension,  chronic systolic HF, type II diabetes mellitus, hyperlipidemia, ?pulmonary fibrosis, cerebrovascular disease and carotid artery disease, s/p Rt CEA and TAVR 07/2016.    Most recent echo 02/2020 showed mildly reduced LVEF 40%. RV systolic function mildly reduced. Mild MR noted. Prosthetic aortic valve stable. Mean gradient 5 mmHg.   Lives home alone. Assisted by home health aide.  Wife died ~3 years ago.   Recently seen in Cardiology clinic by Richardson Dopp, PA-C, on 6/21 and endorsed progressive dyspnea w/ minimal activity, and at times, at rest. Of note, he had had a recent CXR that showed some fibrotic changes. Referral was placed to outpatient pulmonology but given concerns for cardiac etiology, he was set up for [facility]   R/LHC was done by Dr. Saunders Revel today and demonstrated high grade CAD, elevated cardiac filling presures and pulmonary artery pressures, Prominent V-waves on PCWP tracing concerning for severe MR as well as severely reduced CO/CI. See complete cath report below.   He is being admitted for a/c CHF w/ volume overload and low output. CI 1.6 by Fick calculation. 1.3 by Thermodilution. PCWP 27 mmHg.   VSS. No significant symptoms at rest.     [name] [name] 05/11/20 Conclusions: 1. Severe two-vessel coronary artery disease, including 80-90% proximal LAD stenosis involving ostium of D1 (70% D1 stenosis), and sequential 50% ostial and proximal LCx lesions followed by 80% mid/distal LCx stenosis (similar in appearance to 2017). 2. Moderate, diffuse RCA disease similar to 2017. 3. Moderately elevated left heart, right  heart, and pulmonary artery pressures. 4. Prominent V-waves on PCWP tracing; query more severe mitral regurgitation than reported on echocardiogram in 02/2020. 5. Severely reduced cardiac output/index.  Right Heart Pressures RA (mean): 13 mmHg RV (S/EDP): 60/12 mmHg PA (S/D, mean): 60/30 (40) mmHg PCWP (mean): 27 mmHg with prominent V-waves  Ao sat: 98% PA sat: 55%  Fick CO: 3.4 L/min Fick CI: 1.6 L/min/m^2  Thermodilution CO: 2.8 L/min Thermodilution CI: 1.3 L/min/m^2     Review of Systems: [y] = yes, [ ]  = no   General: Weight gain [ ] ; Weight loss [ ] ; Anorexia [ ] ; Fatigue [ y]; Fever [ ] ; Chills [ ] ; Weakness [name] ]  Cardiac: Chest pain/pressure [ ] ; Resting SOB [name] ]; Exertional SOB [name] ]; Orthopnea [ ] ; Pedal Edema [ ] ; Palpitations [ ] ; Syncope [ ] ; Presyncope [ ] ; Paroxysmal nocturnal dyspnea[ ]   Pulmonary: Cough [ ] ; Wheezing[ ] ; Hemoptysis[ ] ; Sputum [ ] ; Snoring [ ]   GI: Vomiting[ ] ; Dysphagia[ ] ; Melena[ ] ; Hematochezia [ ] ; Heartburn[ ] ; Abdominal pain [ ] ; Constipation [ ] ; Diarrhea [ ] ; BRBPR [ ]   GU: Hematuria[ ] ; Dysuria [ ] ; Nocturia[ ]   Vascular: Pain in legs with walking [ ] ; Pain in feet with lying flat [ ] ; Non-healing sores [ ] ; Stroke [ ] ; TIA [ ] ; Slurred speech [ ] ;  Neuro: Headaches[ ] ; Vertigo[ ] ; Seizures[ ] ; Paresthesias[ ] ;Blurred vision [ ] ; Diplopia [ ] ; Vision changes [ ]   Ortho/Skin: Arthritis [name] ]; Joint pain [ y]; Muscle pain [ ] ; Joint swelling [ ] ; Back Pain [ ] ;  Rash [ ]   Psych: Depression[ ] ; Anxiety[ ]   Heme: Bleeding problems [ ] ; Clotting disorders [ ] ; Anemia [ ]   Endocrine: Diabetes [ y]; Thyroid dysfunction[ ]    Home Medications Prior to Admission medications   Medication Sig Start Date End Date Taking? Authorizing Provider  acetaminophen (TYLENOL) 500 MG tablet Take 1,000 mg by mouth every 8 (eight) hours as needed for moderate pain.   Yes [provider]  albuterol (PROVENTIL) (2.5 MG/3ML) 0.083% nebulizer solution Take 3  mLs (2.5 mg total) by nebulization every 6 (six) hours as needed for wheezing or shortness of breath. 11/03/17  Yes Mikey Kirschner, MD  bisoprolol (ZEBETA) 5 MG tablet TAKE (1/2) TABLET BY MOUTH ONCE DAILY. Patient taking differently: Take 2.5 mg by mouth daily.  08/29/19  Yes Kathyrn Drown, MD  blood glucose meter kit and supplies Dispense based on patient and insurance preference. Use to test glucose three times daily.  (FOR ICD-10) 12/31/18  Yes Kathyrn Drown, MD  cholecalciferol (VITAMIN D3) 25 MCG (1000 UNIT) tablet Take 1,000 Units by mouth in the morning and at bedtime.   Yes [provider]  enoxaparin (LOVENOX) 150 MG/ML injection Inject 1 mL (150 mg total) into the skin daily. 05/05/20  Yes Sherren Mocha, MD  furosemide (LASIX) 20 MG tablet TAKE 1 TABLET BY MOUTH ON MONDAY, [place]. Patient taking differently: Take 20 mg by mouth every Monday, Wednesday, and Friday.  04/06/20  Yes Luking, Scott A, MD  insulin glargine (LANTUS SOLOSTAR) 100 UNIT/ML Solostar Pen INJECT 32 UNITS EVERY EVENING. Patient taking differently: Inject 32 Units into the skin every evening. INJECT 32 UNITS EVERY EVENING. 02/03/20  Yes Luking, Elayne Snare, MD  Melatonin 10 MG TABS Take 10 mg by mouth at bedtime.   Yes [provider]  metFORMIN (GLUCOPHAGE-XR) 500 MG 24 hr tablet TAKE 1 TABLET BY MOUTH ONCE DAILY WITH BREAKFAST. Patient taking differently: Take 500 mg by mouth daily with breakfast.  12/13/19  Yes Luking, Elayne Snare, MD  nortriptyline (PAMELOR) 10 MG capsule TAKE 1 OR 2 CAPSULES AT BEDTIME FOR BURNING IN FEET.[28 IN PACKS/ NONE IN BOTTLE] Patient taking differently: Take 10 mg by mouth at bedtime.  04/24/20  Yes Luking, Elayne Snare, MD  potassium chloride SA (KLOR-CON) 20 MEQ tablet TAKE 1 TABLET BY MOUTH ON MONDAY, WEDNESDAY AND FRIDAY. Patient taking differently: Take 20 mEq by mouth every Monday, Wednesday, and Friday.  01/29/20  Yes Luking, Scott A, MD  psyllium (METAMUCIL)  58.6 % powder Take 1 packet by mouth daily.   Yes [provider]  rosuvastatin (CRESTOR) 5 MG tablet TAKE (1) TABLET BY MOUTH ONCE DAILY AT BEDTIME. Patient taking differently: Take 5 mg by mouth daily.  01/29/20  Yes Luking, Elayne Snare, MD  TRUEPLUS PEN NEEDLES 31G X 8 MM MISC USE WITH LANTUS SOLOSTAR EVERY EVENING 06/24/19  Yes Luking, Scott A, MD  warfarin (COUMADIN) 6 MG tablet TAKE 1 TABLET AS DIRECTED. Patient taking differently: Take 3-6 mg by mouth See admin instructions. 3 mg Sun Thurs Sat, 6 mg all other days 08/29/19  Yes Kathyrn Drown, MD    Past Medical History: Past Medical History:  Diagnosis Date  . Acute on chronic systolic CHF (congestive heart failure) ([place]) 02/09/2017  . Acute respiratory failure with hypoxia ([place])   . AKI (acute kidney injury) ([place]) 08/20/2015  . Altered mental status 02/21/2017  . Aortic stenosis    a. mod-sev by echo 05/2016.  [name]  Asthma   . Asthma exacerbation 08/20/2015  . Atrial fibrillation (HCC)    Paroxysmal; Echo in 2007-normal EF; mild LVH; LA enlargement; mild AS, minimal AI; neg. stress nuclear study in 2008   . BPH (benign prostatic hypertrophy) with urinary obstruction 09/27/2011   Transurethral resection of the prostate in 09/2011   . CAD (coronary artery disease), native coronary artery 06/28/2016  . Cardiogenic shock ([place])   . Carotid stenosis 04/25/2012  . Cerebrovascular disease 07/2010   TIA; carotid ultrasound in 07/2010-significant bilateral plaque without focal internal carotid artery stenosis; MRI -encephalomalacia left temporal and right temporal lobes; small inferior right cerebellar infarct; small vessel disease  . Chronic atrial fibrillation (HCC)    Paroxysmal; Echocardiogram in 2007-normal EF; mild LVH; left atrial enlargement; mild stenosis and minimal AI; negative stress nuclear study in 2008  . Chronic systolic CHF (congestive heart failure) ([place])    a. dx 05/2016 - EF 35-40%, diffuse HK, mod-severe AS, mod gradient,  severe AVA VTI likely due to decreased cardiac output in setting of systolic dysfsunction and significant mitral regurgitation, mild MR, mod-severe MR, severe LAE, mild-mod RV dilation, mild RAE, mild-mod TR, mod PASP 40mHg.  . CKD (chronic kidney disease), stage II   . Cognitive dysfunction 05/29/2015   I believe that this is related to his age as well as previous stroke   . Congestive heart failure (CHF) ([place] 02/09/2017  . Degenerative joint disease    feet and legs  . Diabetes mellitus    no insulin; A1c of 6.6 in 2005  . Dizziness    occurs daily,especially in am  . Elevated troponin   . Exertional dyspnea   . Gastroesophageal reflux disease   . Hepatic steatosis   . History of noncompliance with medical treatment   . Hyperlipidemia    adverse reactions to statins and niacin  . Hypertension    Borderline  . Irregular heartbeat   . Mitral regurgitation    a. mod-sev by echo 05/2016  . Peripheral vascular disease ([place]   . Renal insufficiency   . S/P TAVR (transcatheter aortic valve replacement) 07/19/2016   29 mm Edwards Sapien 3 transcatheter heart valve placed via left percutaneous transfemoral approach  . Senile purpura ([place] 08/15/2018  . Temporal arteritis ([place]   . Tricuspid regurgitation    a. mild-mod TR by echo 05/2016  . VT (ventricular tachycardia) ([place]     Past Surgical History: Past Surgical History:  Procedure Laterality Date  . COLONOSCOPY  2002  . COLONOSCOPY  01/19/2012   Procedure: COLONOSCOPY;  Surgeon: NRogene Houston MD;  Location: [facility];  Service: Endoscopy;  Laterality: N/A;  1030  . LIPOMA EXCISION  1980  . ORIF ANKLE FRACTURE  2000   Right  . PERIPHERAL VASCULAR CATHETERIZATION N/A 07/11/2016   Procedure: Carotid PTA/Stent Intervention;  Surgeon: JLorretta Harp MD;  [facility];  Service: Cardiovascular;  Laterality: N/A;  . PROSTATE SURGERY  12/2011  . ROTATOR CUFF REPAIR     Right  . TEE WITHOUT CARDIOVERSION N/A  06/17/2016   Procedure: TRANSESOPHAGEAL ECHOCARDIOGRAM (TEE);  Surgeon: MJerline Pain MD;  [facility]  Service: Cardiovascular;  Laterality: N/A;  . TEE WITHOUT CARDIOVERSION N/A 07/19/2016   Procedure: TRANSESOPHAGEAL ECHOCARDIOGRAM (TEE);  Surgeon: MSherren Mocha MD;  [facility]  Service: Open Heart Surgery;  Laterality: N/A;  . TRANSCATHETER AORTIC VALVE REPLACEMENT, TRANSFEMORAL N/A 07/19/2016   Procedure: TRANSCATHETER AORTIC VALVE REPLACEMENT, TRANSFEMORAL;  Surgeon: MSherren Mocha  MD;  Location: [facility];  Service: Open Heart Surgery;  Laterality: N/A;  . TRANSURETHRAL RESECTION OF PROSTATE  09/2011  . URETHRAL STRICTURE DILATATION  1980s    Family History:  Family History  Problem Relation Age of Onset  . Stroke Mother   . Diabetes Father   . Colon cancer Neg Hx     Social History: Social History   Socioeconomic History  . Marital status: Widowed    Spouse name: Not on file  . Number of children: 1  . Years of education: Not on file  . Highest education level: Not on file  Occupational History  . Occupation: Retired  Tobacco Use  . Smoking status: Former Smoker    Packs/day: 1.00    Years: 20.00    Pack years: 20.00    Types: Cigarettes    Start date: 07/04/1950    Quit date: 04/25/1992    Years since quitting: 28.0  . Smokeless tobacco: Current User    Types: Chew  Substance and Sexual Activity  . Alcohol use: No    Alcohol/week: 0.0 standard drinks  . Drug use: No  . Sexual activity: Not Currently  Other Topics Concern  . Not on file  Social History Narrative  . Not on file   Social Determinants of Health   Financial Resource Strain:   . Difficulty of Paying Living Expenses:   Food Insecurity:   . Worried About Charity fundraiser in the Last Year:   . Arboriculturist in the Last Year:   Transportation Needs:   . Film/video editor (Medical):   [name] Lack of Transportation (Non-Medical):   Physical Activity:   . Days of Exercise per  Week:   . Minutes of Exercise per Session:   Stress:   . Feeling of Stress :   Social Connections:   . Frequency of Communication with Friends and Family:   . Frequency of Social Gatherings with Friends and Family:   . Attends Religious Services:   . Active Member of Clubs or Organizations:   . Attends Archivist Meetings:   [name] Marital Status:     Allergies:  Allergies  Allergen Reactions  . Ambien [Zolpidem Tartrate] Other (See Comments)    Sleep walks  . Lipitor [Atorvastatin Calcium] Other (See Comments)    myalgias  . Ranitidine Other (See Comments)    Chest discomfort  . Simvastatin Other (See Comments)    Myalgias  . Xanax Xr [Alprazolam Er] Other (See Comments)    Tightness in chest  . Cholestatin Other (See Comments)    UNSPECIFIED REACTION   . Clopidogrel Bisulfate Rash    Objective:    Vital Signs:   Temp:  [97.5 F (36.4 C)] 97.5 F (36.4 C) (06/28 0557) Pulse Rate:  [71-102] 85 (06/28 1430) Resp:  [11-29] 26 (06/28 1430) BP: (131-172)/(60-114) 172/60 (06/28 1430) SpO2:  [94 %-100 %] 99 % (06/28 1430) Weight:  [98.4 kg] 98.4 kg (06/28 0557)   Filed Weights   05/11/20 0557  Weight: 98.4 kg     Physical Exam     General:  Frail, elderly WM sitting up in bed. No respiratory difficulty, mildly confused  HEENT: hard of hearing, Normal Neck: Supple. JVD elevated to jaw line. Carotids 2+ bilat; no bruits. No lymphadenopathy or thyromegaly appreciated. Cor: PMI nondisplaced. irregularly irregular rhythm, regular rate. 3/6 murmur at apex. Lungs: Clear Abdomen: Soft, nontender, nondistended. No hepatosplenomegaly. No bruits or masses. Good bowel sounds. Extremities:  No cyanosis, clubbing, rash, trace edema Neuro: Alert & oriented x 3, cranial nerves grossly intact. moves all 4 extremities w/o difficulty. Affect pleasant.   Telemetry   Chronic afib, 80s Personally reviewed  EKG   05/04/20 Afib 80 bpm, LBBB  Labs     Basic Metabolic  Panel: Recent Labs  Lab 05/04/20 1536  NA 136  K 5.0  CL 102  CO2 25  GLUCOSE 134*  BUN 25  CREATININE 1.45*  CALCIUM 10.0    Liver Function Tests: No results for input(s): AST, ALT, ALKPHOS, BILITOT, PROT, ALBUMIN in the last 168 hours. No results for input(s): LIPASE, AMYLASE in the last 168 hours. No results for input(s): AMMONIA in the last 168 hours.  CBC: Recent Labs  Lab 05/04/20 1536  WBC 6.6  HGB 14.7  HCT 44.0  MCV 88  PLT 226    Cardiac Enzymes: No results for input(s): CKTOTAL, CKMB, CKMBINDEX, TROPONINI in the last 168 hours.  BNP: BNP (last 3 results) No results for input(s): BNP in the last 8760 hours.  ProBNP (last 3 results) Recent Labs    04/01/20 1459  PROBNP 985*     CBG: Recent Labs  Lab 05/11/20 0539  GLUCAP 141*    Coagulation Studies: Recent Labs    05/11/20 0607  LABPROT 13.8  INR 1.1    Imaging:  No results found.  Assessment/Plan   1. Acute on Chronic Systolic Heart Failure w/ Low Output - Echo 4/21 LVEF 40%.  RV ok. Mild MR - Admitted w/ NYHA Class IIIb symptoms w/ RHC demonstrating elevated filling pressures and low CO/CI. PCWP 27 mmHg. RA pressure 13 mmHg. CI 1.6 by FICK, 1.3 by [place] line to follow co-ox and CVPs - Start Milrinone 0.125 mgc/kg/min. Monitor HR on tele (chronic Afib).  - Start IV Lasix 80 mg bid - hold  blocker w/ low output - Will try to add ARB/ARNI, spiro and dig, pending renal function (BMP pending) - RHC w/ prominent V-waves on PCWP tracing; query more severe mitral regurgitation than reported on echocardiogram in 02/2020. Will repeat echo   2. CAD - LHC in 2017 showed moderate nonobstructive disease - [facility] this admit w/ progressive disease: severe 2VD w/ 80-90% proximal LAD stenosis involving ostium of D1 (70% D1 stenosis), and sequential 50% ostial and proximal LCx lesions followed by 80% mid/distal LCx stenosis (similar in appearance to 2017). Moderate, diffuse RCA disease  similar to 2017. - no chest pain but +exertional dyspnea - Plan to optimize from CHF standpoint and plan potential intervention of LAD prior to d/c. No plans to intervene on LCx given relatively small territory  - Due to history of clopidogrel allergy, he would need to be treated with at least 3 months (ideally at least 6 months) of ticagrelor and warfarin, at which time transition to aspirin and warfarin could be considered. - continue statin - no  blocker w/ low output    3. Mitral Regurgitation - Echo 02/2020 showed only mild MR - RHC w/ prominent V-waves on PCWP tracing; query more severe mitral regurgitation than reported on echocardiogram in 02/2020. Will repeat 2D echo, followed by TEE if indicated  - if severe, ? MitraClip    4. H/o Aortic Stenosis w/ TAVR - s/p TAVR 07/2016 - Echo 4/21 showed stable prosthesis. Mean gradient 5 mmHg. - repeat echo   5. Carotid Artery Disease  - s/p Rt CEA 2017  - last carotid dopplers 2018 showed patent RICA and mod (  50-69%) Lt ICA stenosis - asymptomatic - continue medical therapy  - outpatient surveillance per gen cards   6. Chronic Afib - rate controlled - monitor w/ milrinone. May need shorterm IV amio for rate control, but would avoid longterm use w/ concerns for possible pulmonary fibrosis  - continue coumadin per pharmacy   7. T2DM - poorly controlled. Hgb A1c 3/21 was 9.8  - hold metformin  - cover w/ SSI  - consider SGLT2i (GFR 44)   8. Stage III CKD - baseline SCr ~1.3-1.5 - follow w/ diuresis   9. ? Pulmonary Fibrosis - CXR 4/21 showed Interstitial fibrotic changes in the bases - outpatient pulmonology referral pending    Lyda Jester, PA-C 05/11/2020, 2:38 PM  Advanced Heart Failure Team Pager [phone] (M-F; 7a - 4p)  Please contact [facility] for night-coverage after hours (4p -7a ) and weekends on [email or web address]  Patient seen and examined with the above-signed Advanced Practice Provider and/or Housestaff.  I personally reviewed laboratory data, imaging studies and relevant notes. I independently examined the patient and formulated the important aspects of the plan. I have edited the note to reflect any of my changes or salient points. I have personally discussed the plan with the patient and/or family.  Very difficult situation. 86 y/o male with DM2, HTN, CKD, CHF with EF 40%, CAD and AS s/p TAVR being admitted from cath lab for low output HF and significant 2v CAD.   Poor historian. But apparently lives alone after his wife died 2 years ago. He nephew (steven) and his wife are closely involved. Has CMAs with stay with him at night.   Seems to have worsening weakness and SOB recently. Brought in today for R/L cath. Results as above  General:  Elderly weak appearing. No resp difficulty. HOH HEENT: normal Neck: supple. no JVD. Carotids 2+ bilat; no bruits. No lymphadenopathy or thryomegaly appreciated. Cor: PMI nondisplaced. Irregular rate & rhythm. No rubs, gallops or murmurs. Lungs: clear Abdomen: soft, nontender, nondistended. No hepatosplenomegaly. No bruits or masses. Good bowel sounds. Extremities: no cyanosis, clubbing, rash, tr edema Neuro: alert & orientedx3, cranial nerves grossly intact. moves all 4 extremities w/o difficulty. Affect pleasant  Will start milrinone and diurese with IV lasix and see how good we can get him. Repeat echo to reassess EF and degree of MR. Will need family discussions to help set [place]. If candidate for aggressive care would consider PCI of LAD. Doubt he is good candidate for CRT.   [name], MD  4:55 PM

## 2020-05-11 NOTE — Progress Notes (Signed)
Spoke with Angelica Ran regarding using patient's right arm for PICC insertion.  Per Sharolyn Douglas, NP okay to place PICC on right.

## 2020-05-11 NOTE — Interval H&P Note (Signed)
History and Physical Interval Note:  05/11/2020 12:51 PM  [name] [name]  has presented today for surgery, with the diagnosis of shortness of breath and coronary artery disease.  The various methods of treatment have been discussed with the patient and family. After consideration of risks, benefits and other options for treatment, the patient has consented to  Procedure(s): RIGHT/LEFT HEART CATH AND CORONARY ANGIOGRAPHY (N/A) as a surgical intervention.  The patient's history has been reviewed, patient examined, no change in status, stable for surgery.  I have reviewed the patient's chart and labs.  Questions were answered to the patient's satisfaction.    Cath Lab Visit (complete for each Cath Lab visit)  Clinical Evaluation Leading to the Procedure:   ACS: No.  Non-ACS:    Anginal Classification: CCS III  Anti-ischemic medical therapy: Minimal Therapy (1 class of medications)  Non-Invasive Test Results: No non-invasive testing performed  Prior CABG: No previous CABG  [name] [name]

## 2020-05-12 ENCOUNTER — Encounter (HOSPITAL_COMMUNITY): Payer: Self-pay | Admitting: Internal Medicine

## 2020-05-12 ENCOUNTER — Inpatient Hospital Stay (HOSPITAL_COMMUNITY): Payer: PPO

## 2020-05-12 DIAGNOSIS — I4821 Permanent atrial fibrillation: Secondary | ICD-10-CM

## 2020-05-12 DIAGNOSIS — I5041 Acute combined systolic (congestive) and diastolic (congestive) heart failure: Secondary | ICD-10-CM

## 2020-05-12 DIAGNOSIS — I447 Left bundle-branch block, unspecified: Secondary | ICD-10-CM

## 2020-05-12 LAB — CBC
HCT: 42.3 % (ref 39.0–52.0)
Hemoglobin: 13.8 g/dL (ref 13.0–17.0)
MCH: 29.2 pg (ref 26.0–34.0)
MCHC: 32.6 g/dL (ref 30.0–36.0)
MCV: 89.6 fL (ref 80.0–100.0)
Platelets: 188 10*3/uL (ref 150–400)
RBC: 4.72 MIL/uL (ref 4.22–5.81)
RDW: 14.3 % (ref 11.5–15.5)
WBC: 7.4 10*3/uL (ref 4.0–10.5)
nRBC: 0 % (ref 0.0–0.2)

## 2020-05-12 LAB — BASIC METABOLIC PANEL
Anion gap: 13 (ref 5–15)
BUN: 24 mg/dL — ABNORMAL HIGH (ref 8–23)
CO2: 26 mmol/L (ref 22–32)
Calcium: 9.6 mg/dL (ref 8.9–10.3)
Chloride: 98 mmol/L (ref 98–111)
Creatinine, Ser: 1.46 mg/dL — ABNORMAL HIGH (ref 0.61–1.24)
GFR calc Af Amer: 50 mL/min — ABNORMAL LOW (ref >60)
GFR calc non Af Amer: 43 mL/min — ABNORMAL LOW (ref >60)
Glucose, Bld: 185 mg/dL — ABNORMAL HIGH (ref 70–99)
Potassium: 3.7 mmol/L (ref 3.5–5.1)
Sodium: 137 mmol/L (ref 135–145)

## 2020-05-12 LAB — HEPARIN LEVEL (UNFRACTIONATED)
Heparin Unfractionated: 0.36 IU/mL (ref 0.30–0.70)
Heparin Unfractionated: 0.32 IU/mL (ref 0.30–0.70)

## 2020-05-12 LAB — COOXEMETRY PANEL
Carboxyhemoglobin: 1.9 % — ABNORMAL HIGH (ref 0.5–1.5)
Methemoglobin: 0.9 % (ref 0.0–1.5)
O2 Saturation: 66.3 %
Total hemoglobin: 14.4 g/dL (ref 12.0–16.0)

## 2020-05-12 LAB — ECHOCARDIOGRAM COMPLETE
Height: 73 in
Weight: 3502.67 oz

## 2020-05-12 LAB — GLUCOSE, CAPILLARY
Glucose-Capillary: 168 mg/dL — ABNORMAL HIGH (ref 70–99)
Glucose-Capillary: 265 mg/dL — ABNORMAL HIGH (ref 70–99)
Glucose-Capillary: 248 mg/dL — ABNORMAL HIGH (ref 70–99)
Glucose-Capillary: 214 mg/dL — ABNORMAL HIGH (ref 70–99)

## 2020-05-12 MED ORDER — AMIODARONE IV BOLUS ONLY 150 MG/100ML
150.0000 mg | Freq: Once | INTRAVENOUS | Status: AC
  Administered 2020-05-12: 150 mg via INTRAVENOUS

## 2020-05-12 MED ORDER — AMIODARONE HCL IN DEXTROSE 360-4.14 MG/200ML-% IV SOLN
60.0000 mg/h | INTRAVENOUS | Status: DC
  Administered 2020-05-12 – 2020-05-13 (×7): 60 mg/h via INTRAVENOUS
  Filled 2020-05-12 (×6): qty 200

## 2020-05-12 MED ORDER — SPIRONOLACTONE 12.5 MG HALF TABLET
12.5000 mg | ORAL_TABLET | Freq: Every day | ORAL | Status: DC
  Administered 2020-05-12 – 2020-05-13 (×2): 12.5 mg via ORAL
  Filled 2020-05-12 (×3): qty 1

## 2020-05-12 MED FILL — Verapamil HCl IV Soln 2.5 MG/ML: INTRAVENOUS | Qty: 2 | Status: AC

## 2020-05-12 MED FILL — Ipratropium-Albuterol Nebu Soln 0.5-2.5(3) MG/3ML: RESPIRATORY_TRACT | Qty: 3 | Status: AC

## 2020-05-12 NOTE — Progress Notes (Signed)
Pt's HR fluctuates bet 110-130's.MD on call Dr.Rose made aware & said he will look at his meds first before giving orders.

## 2020-05-12 NOTE — [place] FL2 (Signed)
[name] [name] MEDICAID FL2 LEVEL OF CARE SCREENING TOOL     IDENTIFICATION  Patient Name: [name] [name] Birthdate: Jan 04, 1932 Sex: male Admission Date (Current Location): 05/11/2020  [facility] and [place] Number:  [employer] and Address:  [facility], [place] [address], [place], [place] [address]      Provider Number: [id number]  Attending Physician Name and Address:  Jolaine Artist, MD  Relative Name and Phone Number:       Current Level of Care: Hospital Recommended Level of Care: [place] Prior Approval Number:    Date Approved/Denied:   PASRR Number: [id number]  Discharge Plan: SNF    Current Diagnoses: Patient Active Problem List   Diagnosis Date Noted  . Acute on chronic HFrEF (heart failure with reduced ejection fraction) ([name] [name]) 05/11/2020  . Acute on chronic systolic heart failure, NYHA class 3 ([name] [name]) 05/11/2020  . Senile purpura ([name] [name]) 08/15/2018  . Renal insufficiency 03/14/2018  . Altered mental status 02/21/2017  . Congestive heart failure (CHF) ([name]) 02/09/2017  . Chronic systolic heart failure ([name] [name]) 02/09/2017  . Controlled type 2 diabetes mellitus with hyperglycemia ([name] [name]) 02/09/2017  . Elevated troponin   . S/P TAVR (transcatheter aortic valve replacement) 07/19/2016  . Mitral regurgitation   . VT (ventricular tachycardia) ([place])   . Cardiogenic shock ([place])   . Acute respiratory failure with hypoxia ([place])   . Shortness of breath 06/28/2016  . CAD (coronary artery disease), native coronary artery 06/28/2016  . Acute on chronic combined systolic and diastolic CHF, NYHA class 4 ([name] [name])   . Anemia of chronic disease 04/12/2016  . Orthostatic hypotension 08/20/2015  . AKI (acute kidney injury) ([place]) 08/20/2015  . Asthma exacerbation 08/20/2015  . Cognitive dysfunction 05/29/2015  . Chronic back pain 07/29/2014  . Lumbar pain 06/23/2014  . Chronic pain of right ankle 05/30/2014  . Diabetic neuropathy ([name] [name])  05/30/2014  . Chest congestion 05/02/2013  . Wheezing 05/02/2013  . Unspecified constipation 04/30/2013  . Leg edema, left 04/27/2013  . Carotid stenosis 04/25/2012  . BPH (benign prostatic hypertrophy) with urinary obstruction 09/27/2011  . Dyspnea on exertion 08/01/2011  . Diabetes mellitus ([name] [name]) 07/14/2011  . Hypertension   . Atrial fibrillation ([name] [name])   . Hyperlipidemia   . History of noncompliance with medical treatment   . Aortic stenosis   . Cerebrovascular disease 07/15/2010    Orientation RESPIRATION BLADDER Height & Weight     Self  Normal Continent Weight: 218 lb 14.7 oz (99.3 kg) Height:  6\' 1"  (185.4 cm)  BEHAVIORAL SYMPTOMS/MOOD NEUROLOGICAL BOWEL NUTRITION STATUS      Continent Diet (see discharge summary)  AMBULATORY STATUS COMMUNICATION OF NEEDS Skin   Extensive Assist Verbally  (generalized ecchymosis)                       Personal Care Assistance Level of Assistance  Bathing, Feeding, Dressing Bathing Assistance: Limited assistance Feeding assistance: Independent Dressing Assistance: Limited assistance     Functional Limitations Info  Sight, Hearing, Speech Sight Info: Adequate Hearing Info: Adequate Speech Info: Adequate    SPECIAL CARE FACTORS FREQUENCY  PT (By licensed PT), OT (By licensed OT)     PT Frequency: 5x week OT Frequency: 5x week            Contractures Contractures Info: Not present    Additional Factors Info  Code Status, Allergies, Psychotropic, Insulin Sliding Scale Code Status Info: Full Code Allergies Info: Ambien (Zolpidem  Tartrate), Lipitor (Atorvastatin Calcium), Ranitidine, Simvastatin, Xanax Xr (Alprazolam Er), Cholestatin, Clopidogrel Bisulfate Psychotropic Info: nortriptyline (PAMELOR) capsule 10 mg daily at bedtime PO Insulin Sliding Scale Info: insulin aspart (novoLOG) injection 0-9 Units 3x daily with meals; insulin glargine (LANTUS) injection 32 Units every evening;       Current Medications  (05/12/2020):  This is the current hospital active medication list Current Facility-Administered Medications  Medication Dose Route Frequency Provider Last Rate Last Admin  . 0.9 %  sodium chloride infusion  250 mL Intravenous PRN End, Harrell Gave, MD      . 0.9 %  sodium chloride infusion  250 mL Intravenous PRN Rosita Fire, Brittainy M, PA-C      . acetaminophen (TYLENOL) tablet 1,000 mg  1,000 mg Oral Q8H PRN End, Harrell Gave, MD      . albuterol (PROVENTIL) (2.5 MG/3ML) 0.083% nebulizer solution 2.5 mg  2.5 mg Nebulization Once End, Christopher, MD      . albuterol (PROVENTIL) (2.5 MG/3ML) 0.083% nebulizer solution 2.5 mg  2.5 mg Nebulization Q6H PRN End, Harrell Gave, MD      . amiodarone (NEXTERONE PREMIX) 360-4.14 MG/200ML-% (1.8 mg/mL) IV infusion  60 mg/hr Intravenous Continuous Marykay Lex, MD 33.3 mL/hr at 05/12/20 1427 60 mg/hr at 05/12/20 1427  . Chlorhexidine Gluconate Cloth 2 % PADS 6 each  6 each Topical Daily Bensimhon, Shaune Pascal, MD   6 each at 05/12/20 [phone]  . furosemide (LASIX) injection 80 mg  80 mg Intravenous BID Lyda Jester M, PA-C   80 mg at 05/12/20 2409  . heparin ADULT infusion 100 units/mL (25000 units/228mL sodium chloride 0.45%)  1,200 Units/hr Intravenous Continuous Bensimhon, Shaune Pascal, MD 12 mL/hr at 05/11/20 2158 1,200 Units/hr at 05/11/20 2158  . insulin aspart (novoLOG) injection 0-9 Units  0-9 Units Subcutaneous TID WC End, Christopher, MD   5 Units at 05/12/20 1220  . insulin glargine (LANTUS) injection 32 Units  32 Units Subcutaneous QPM End, Christopher, MD   32 Units at 05/11/20 2146  . levalbuterol (XOPENEX) nebulizer solution 0.63 mg  0.63 mg Nebulization Q6H PRN End, Harrell Gave, MD   0.63 mg at 05/11/20 1452  . melatonin tablet 9 mg  9 mg Oral QHS End, Christopher, MD   9 mg at 05/11/20 2145  . milrinone (PRIMACOR) 20 MG/100 ML (0.2 mg/mL) infusion  0.125 mcg/kg/min Intravenous Continuous Consuelo Pandy, PA-C 3.69 mL/hr at 05/12/20 0734 0.125  mcg/kg/min at 05/12/20 0734  . nortriptyline (PAMELOR) capsule 10 mg  10 mg Oral QHS End, Christopher, MD   10 mg at 05/11/20 2145  . ondansetron (ZOFRAN) injection 4 mg  4 mg Intravenous Q6H PRN End, Harrell Gave, MD      . psyllium (HYDROCIL/METAMUCIL) packet 1 packet  1 packet Oral Daily End, Christopher, MD   1 packet at 05/12/20 [phone]  . rosuvastatin (CRESTOR) tablet 5 mg  5 mg Oral Daily End, Christopher, MD   5 mg at 05/12/20 0834  . sodium chloride flush (NS) 0.9 % injection 10-40 mL  10-40 mL Intracatheter Q12H Bensimhon, Shaune Pascal, MD   10 mL at 05/12/20 0834  . sodium chloride flush (NS) 0.9 % injection 10-40 mL  10-40 mL Intracatheter PRN Bensimhon, Shaune Pascal, MD      . sodium chloride flush (NS) 0.9 % injection 3 mL  3 mL Intravenous Q12H End, Christopher, MD   3 mL at 05/12/20 0835  . sodium chloride flush (NS) 0.9 % injection 3 mL  3 mL Intravenous PRN End, Harrell Gave, MD      .  sodium chloride flush (NS) 0.9 % injection 3 mL  3 mL Intravenous Q12H Lyda Jester M, PA-C   3 mL at 05/12/20 0835  . sodium chloride flush (NS) 0.9 % injection 3 mL  3 mL Intravenous PRN Lyda Jester M, PA-C   3 mL at 05/11/20 2201  . spironolactone (ALDACTONE) tablet 12.5 mg  12.5 mg Oral QHS Consuelo Pandy, PA-C         Discharge Medications: Please see discharge summary for a list of discharge medications.  Relevant Imaging Results:  Relevant Lab Results:   Additional Information SSN: [id number]  [place]

## 2020-05-12 NOTE — Progress Notes (Signed)
ANTICOAGULATION CONSULT NOTE - Follow Up Consult  Pharmacy Consult for heparin Indication: atrial fibrillation  Allergies  Allergen Reactions  . Ambien [Zolpidem Tartrate] Other (See Comments)    Sleep walks  . Lipitor [Atorvastatin Calcium] Other (See Comments)    myalgias  . Ranitidine Other (See Comments)    Chest discomfort  . Simvastatin Other (See Comments)    Myalgias  . Xanax Xr [Alprazolam Er] Other (See Comments)    Tightness in chest  . Cholestatin Other (See Comments)    UNSPECIFIED REACTION   . Clopidogrel Bisulfate Rash    Patient Measurements: Height: 6\' 1"  (185.4 cm) Weight: 99.3 kg (218 lb 14.7 oz) IBW/kg (Calculated) : 79.9  Vital Signs: Temp: 97.5 F (36.4 C) (06/29 0735) Temp Source: Oral (06/29 0735) BP: 119/76 (06/29 0735) Pulse Rate: 90 (06/29 0735)  Labs: Recent Labs    05/11/20 0607 05/11/20 1334 05/11/20 1334 05/11/20 1340 05/12/20 0500 05/12/20 1200  HGB  --  14.3   < > 13.9 13.8  --   HCT  --  42.0  --  41.0 42.3  --   PLT  --   --   --   --  188  --   LABPROT 13.8  --   --   --   --   --   INR 1.1  --   --   --   --   --   HEPARINUNFRC  --   --   --   --  0.36 0.32  CREATININE  --   --   --   --  1.46*  --    < > = values in this interval not displayed.    Estimated Creatinine Clearance: 45.9 mL/min (A) (by C-G formula based on SCr of 1.46 mg/dL (H)).  Assessment:  18 yoM on warfarin PTA started on heparin after cath. Heparin therapeutic, CBC stable.  Goal of Therapy:  Heparin level 0.3-0.7 units/ml Monitor platelets by anticoagulation protocol: Yes   Plan:  Continue heparin at 1200 units/hr Daily heparin level and CBC   Arrie Senate, PharmD, BCPS Clinical Pharmacist [phone] Please check AMION for all [place] numbers 05/12/2020

## 2020-05-12 NOTE — Progress Notes (Addendum)
Advanced Heart Failure Rounding Note  PCP-Cardiologist: Sherren Mocha, MD   Subjective:    Co-ox 66% on 0.125 of milrinone. Good UOP. -4.3L out yesterday. SCr ok at 1.46 (Baseline 1.3-1.5). CVP 11  Echo repeated today. LVEF 35-40%. RV normal. MR moderate. Prosthetic aortic valve function is ok.   Sitting up in bed eating lunch. Feels ok. No CP and no resting dyspnea. Asking when he can go home. Family member present at bedside.     Echo 05/12/20 1. Left ventricular ejection fraction, by estimation, is 35 to 40%. The left ventricle has moderately decreased function. The left ventricle demonstrates global hypokinesis. Left ventricular diastolic function could not be evaluated. 2. Right ventricular systolic function is normal. The right ventricular size is normal. There is mildly elevated pulmonary artery systolic pressure. 3. Left atrial size was severely dilated. 4. Right atrial size was moderately dilated. 5. The mitral valve is normal in structure. Moderate mitral valve regurgitation. 6. Tricuspid valve regurgitation is moderate. 7. The aortic valve has been repaired/replaced. Aortic valve regurgitation is not visualized. There is a 29 mm Edwards Sapien prosthetic (TAVR) valve present in the aortic position. Echo findings are consistent with normal structure and function of the aortic valve prosthesis. 8. The inferior vena cava is normal in size with greater than 50% respiratory variability, suggesting right atrial pressure of 3 mmHg.  Objective:   Weight Range: 99.3 kg Body mass index is 28.88 kg/m.   Vital Signs:   Temp:  [97.5 F (36.4 C)-98 F (36.7 C)] 97.5 F (36.4 C) (06/29 0735) Pulse Rate:  [39-137] 90 (06/29 0735) Resp:  [11-29] 22 (06/29 0735) BP: (113-199)/(58-114) 119/76 (06/29 0735) SpO2:  [73 %-100 %] 96 % (06/29 0735) Weight:  [99.3 kg] 99.3 kg (06/29 0500) Last BM Date: 05/10/20  Weight change: Filed Weights   05/11/20 0557 05/12/20 0500    Weight: 98.4 kg 99.3 kg    Intake/Output:   Intake/Output Summary (Last 24 hours) at 05/12/2020 1201 Last data filed at 05/12/2020 1018 Gross per 24 hour  Intake 613.7 ml  Output 4070 ml  Net -3456.3 ml      Physical Exam    CVP 11 General:  Well appearing elderly WM. No resp difficulty HEENT: Normal Neck: Supple. JVP ~11 cm . Carotids 2+ bilat; no bruits. No lymphadenopathy or thyromegaly appreciated. Cor: PMI nondisplaced. Irregularly irregular rhythm, regular rate. 3/6 MR murmur at apex.  Lungs: decreased BS at the bases bilaterally  Abdomen: Soft, nontender, nondistended. No hepatosplenomegaly. No bruits or masses. Good bowel sounds. Extremities: No cyanosis, clubbing, rash, edema +RUE PICC Neuro: Alert & orientedx3, cranial nerves grossly intact. moves all 4 extremities w/o difficulty. Affect pleasant   Telemetry   Chronic afib 80s-90s   EKG    No new EKG to review   Labs    CBC Recent Labs    05/11/20 1340 05/12/20 0500  WBC  --  7.4  HGB 13.9 13.8  HCT 41.0 42.3  MCV  --  89.6  PLT  --  528   Basic Metabolic Panel Recent Labs    05/11/20 1340 05/12/20 0500  NA 138 137  K 4.3 3.7  CL  --  98  CO2  --  26  GLUCOSE  --  185*  BUN  --  24*  CREATININE  --  1.46*  CALCIUM  --  9.6   Liver Function Tests No results for input(s): AST, ALT, ALKPHOS, BILITOT, PROT, ALBUMIN in the last 72  hours. No results for input(s): LIPASE, AMYLASE in the last 72 hours. Cardiac Enzymes No results for input(s): CKTOTAL, CKMB, CKMBINDEX, TROPONINI in the last 72 hours.  BNP: BNP (last 3 results) Recent Labs    05/11/20 2004  BNP 154.4*    ProBNP (last 3 results) Recent Labs    04/01/20 1459  PROBNP 985*     D-Dimer No results for input(s): DDIMER in the last 72 hours. Hemoglobin A1C Recent Labs    05/11/20 2004  HGBA1C 7.8*   Fasting Lipid Panel No results for input(s): CHOL, HDL, LDLCALC, TRIG, CHOLHDL, LDLDIRECT in the last 72  hours. Thyroid Function Tests No results for input(s): TSH, T4TOTAL, T3FREE, THYROIDAB in the last 72 hours.  Invalid input(s): FREET3  Other results:   Imaging    CARDIAC CATHETERIZATION  Result Date: 05/11/2020 Conclusions: 1. Severe two-vessel coronary artery disease, including 80-90% proximal LAD stenosis involving ostium of D1 (70% D1 stenosis), and sequential 50% ostial and proximal LCx lesions followed by 80% mid/distal LCx stenosis (similar in appearance to 2017). 2. Moderate, diffuse RCA disease similar to 2017. 3. Moderately elevated left heart, right heart, and pulmonary artery pressures. 4. Prominent V-waves on PCWP tracing; query more severe mitral regurgitation than reported on echocardiogram in 02/2020. 5. Severely reduced cardiac output/index. Recommendations: 1. Admit for medical therapy of low output heart failure; advanced heart failure team has been consulted. 2. Once the patient has been optimized from a heart failure standpoint, PCI to the proximal LAD will need to be considered.  Due to history of clopidogrel allergy, he would need to be treated with at least 3 months (ideally at least 6 months) of ticagrelor and warfarin, at which time transition to aspirin and warfarin could be considered. 3. Secondary prevention of coronary artery disease. Nelva Bush, MD [facility]   DG CHEST PORT 1 VIEW  Addendum Date: 05/11/2020   ADDENDUM REPORT: 05/11/2020 21:41 ADDENDUM: The PICC line terminates at the cavoatrial junction. Electronically Signed   By: Constance Holster M.D.   On: 05/11/2020 21:41   Result Date: 05/11/2020 CLINICAL DATA:  Dyspnea EXAM: PORTABLE CHEST 1 VIEW COMPARISON:  02/14/2020 FINDINGS: The patient is status post TAVR. There is a well-positioned right-sided PICC line. The heart size remains enlarged. There are aortic calcifications. There is mild vascular congestion. There is a small right-sided pleural effusion. There is a hazy ill-defined opacity  overlying the right lower lung zone. IMPRESSION: 1. Well-positioned right-sided PICC line. 2. Cardiomegaly with mild vascular congestion. 3. Hazy ill-defined opacity overlying the right lower lung zone may represent atelectasis or infiltrate. Electronically Signed: By: Constance Holster M.D. On: 05/11/2020 21:26   ECHOCARDIOGRAM COMPLETE  Result Date: 05/12/2020    ECHOCARDIOGRAM REPORT   Patient Name:   [name] Date of Exam: 05/12/2020 Medical Rec #:  [id number]      Height:       73.0 in Accession #:    [id number]     Weight:       218.9 lb Date of Birth:  08-21-1932      BSA:          2.236 m Patient Age:    55 years       BP:           119/76 mmHg Patient Gender: M              HR:           94 bpm. Exam Location:  Inpatient Procedure:  2D Echo, Cardiac Doppler and Color Doppler Indications:    I50.41 Acute combined systolic (congestive) and diastolic                 (congestive) heart failure. Mitral valve insufficiency.  History:        Patient has prior history of Echocardiogram examinations, most                 recent 02/14/2020. CHF, CAD, Abnormal ECG, Aortic Valve Disease                 and Mitral Valve Disease, Arrythmias:Atrial Fibrillation,                 Signs/Symptoms:Hypotension, Altered Mental Status and Dyspnea;                 Risk Factors:Diabetes, Hypertension and Dyslipidemia. Aortic                 stenosis. Cardiac shock. S/P TAVR 59mm Edwards Sapien.                 Aortic Valve: 29 mm Edwards Sapien prosthetic, stented (TAVR)                 valve is present in the aortic position.  Sonographer:    Roseanna Rainbow RDCS Referring Phys: [place] Comments: Technically difficult study due to poor echo windows. IMPRESSIONS  1. Left ventricular ejection fraction, by estimation, is 35 to 40%. The left ventricle has moderately decreased function. The left ventricle demonstrates global hypokinesis. Left ventricular diastolic function could not be evaluated.  2. Right  ventricular systolic function is normal. The right ventricular size is normal. There is mildly elevated pulmonary artery systolic pressure.  3. Left atrial size was severely dilated.  4. Right atrial size was moderately dilated.  5. The mitral valve is normal in structure. Moderate mitral valve regurgitation.  6. Tricuspid valve regurgitation is moderate.  7. The aortic valve has been repaired/replaced. Aortic valve regurgitation is not visualized. There is a 29 mm Edwards Sapien prosthetic (TAVR) valve present in the aortic position. Echo findings are consistent with normal structure and function of the aortic valve prosthesis.  8. The inferior vena cava is normal in size with greater than 50% respiratory variability, suggesting right atrial pressure of 3 mmHg. Comparison(s): No significant change from prior study. Prior images reviewed side by side. FINDINGS  Left Ventricle: Left ventricular ejection fraction, by estimation, is 35 to 40%. The left ventricle has moderately decreased function. The left ventricle demonstrates global hypokinesis. The left ventricular internal cavity size was normal in size. There is borderline concentric left ventricular hypertrophy. Abnormal (paradoxical) septal motion, consistent with left bundle branch block. Left ventricular diastolic function could not be evaluated due to atrial fibrillation. Left ventricular diastolic  function could not be evaluated. Right Ventricle: The right ventricular size is normal. No increase in right ventricular wall thickness. Right ventricular systolic function is normal. There is mildly elevated pulmonary artery systolic pressure. The tricuspid regurgitant velocity is 2.95  m/s, and with an assumed right atrial pressure of 3 mmHg, the estimated right ventricular systolic pressure is 38.2 mmHg. Left Atrium: Left atrial size was severely dilated. Right Atrium: Right atrial size was moderately dilated. Pericardium: There is no evidence of pericardial  effusion. Mitral Valve: The mitral valve is normal in structure. There is mild thickening of the mitral valve leaflet(s). Mild mitral annular calcification. Moderate mitral valve regurgitation, with  centrally-directed jet. MV peak gradient, 13.2 mmHg. The mean mitral valve gradient is 5.0 mmHg. Tricuspid Valve: The tricuspid valve is normal in structure. Tricuspid valve regurgitation is moderate. Aortic Valve: The aortic valve has been repaired/replaced. Aortic valve regurgitation is not visualized. Aortic valve mean gradient measures 6.0 mmHg. Aortic valve peak gradient measures 11.6 mmHg. Aortic valve area, by VTI measures 3.24 cm. There is a 29 mm Edwards Sapien prosthetic, stented (TAVR) valve present in the aortic position. Echo findings are consistent with normal structure and function of the aortic valve prosthesis. Pulmonic Valve: The pulmonic valve was normal in structure. Pulmonic valve regurgitation is not visualized. Aorta: The aortic root is normal in size and structure. Venous: The inferior vena cava is normal in size with greater than 50% respiratory variability, suggesting right atrial pressure of 3 mmHg. IAS/Shunts: No atrial level shunt detected by color flow Doppler.  LEFT VENTRICLE PLAX 2D LVIDd:         5.03 cm LVIDs:         4.36 cm LV PW:         1.19 cm LV IVS:        1.15 cm LVOT diam:     2.30 cm LV SV:         113 LV SV Index:   50 LVOT Area:     4.15 cm  LV Volumes (MOD) LV vol d, MOD A2C: 134.0 ml LV vol d, MOD A4C: 126.0 ml LV vol s, MOD A2C: 62.8 ml LV vol s, MOD A4C: 72.1 ml LV SV MOD A2C:     71.2 ml LV SV MOD A4C:     126.0 ml LV SV MOD BP:      65.2 ml RIGHT VENTRICLE             IVC RV S prime:     11.00 cm/s  IVC diam: 1.74 cm TAPSE (M-mode): 1.7 cm LEFT ATRIUM              Index       RIGHT ATRIUM           Index LA diam:        4.60 cm  2.06 cm/m  RA Area:     26.70 cm LA Vol (A2C):   158.0 ml 70.67 ml/m RA Volume:   93.70 ml  41.91 ml/m LA Vol (A4C):   85.9 ml  38.42  ml/m LA Biplane Vol: 117.0 ml 52.33 ml/m  AORTIC VALVE AV Area (Vmax):    3.98 cm AV Area (Vmean):   4.39 cm AV Area (VTI):     3.24 cm AV Vmax:           170.00 cm/s AV Vmean:          122.000 cm/s AV VTI:            0.347 m AV Peak Grad:      11.6 mmHg AV Mean Grad:      6.0 mmHg LVOT Vmax:         163.00 cm/s LVOT Vmean:        129.000 cm/s LVOT VTI:          0.271 m LVOT/AV VTI ratio: 0.78  AORTA Ao Root diam: 3.50 cm Ao Asc diam:  3.00 cm MITRAL VALVE                 TRICUSPID VALVE MV Area (PHT): 5.01 cm      TR Peak grad:  34.8 mmHg MV Peak grad:  13.2 mmHg     TR Vmax:        295.00 cm/s MV Mean grad:  5.0 mmHg MV Vmax:       1.82 m/s      SHUNTS MV Vmean:      91.6 cm/s     Systemic VTI:  0.27 m MV Decel Time: 151 msec      Systemic Diam: 2.30 cm MR Peak grad:    158.3 mmHg MR Mean grad:    101.0 mmHg MR Vmax:         629.00 cm/s MR Vmean:        477.0 cm/s MR PISA:         1.01 cm MR PISA Eff ROA: 6 mm MR PISA Radius:  0.40 cm MV E velocity: 165.33 cm/s Dani Gobble Croitoru MD Electronically signed by Sanda Klein MD Signature Date/Time: 05/12/2020/11:42:52 AM    Final    [place] EKG SITE RITE  Result Date: 05/11/2020 If Site Rite image not attached, placement could not be confirmed due to current cardiac rhythm.     Medications:     Scheduled Medications: . albuterol  2.5 mg Nebulization Once  . Chlorhexidine Gluconate Cloth  6 each Topical Daily  . furosemide  80 mg Intravenous BID  . insulin aspart  0-9 Units Subcutaneous TID WC  . insulin glargine  32 Units Subcutaneous QPM  . melatonin  9 mg Oral QHS  . nortriptyline  10 mg Oral QHS  . psyllium  1 packet Oral Daily  . rosuvastatin  5 mg Oral Daily  . sodium chloride flush  10-40 mL Intracatheter Q12H  . sodium chloride flush  3 mL Intravenous Q12H  . sodium chloride flush  3 mL Intravenous Q12H     Infusions: . sodium chloride    . sodium chloride    . amiodarone 60 mg/hr (05/12/20 1003)  . heparin 1,200 Units/hr  (05/11/20 2158)  . milrinone 0.125 mcg/kg/min (05/12/20 0734)     PRN Medications:  sodium chloride, sodium chloride, acetaminophen, albuterol, levalbuterol, ondansetron (ZOFRAN) IV, sodium chloride flush, sodium chloride flush, sodium chloride flush   Assessment/Plan   1. Acute on Chronic Systolic Heart Failure w/ Low Output - Echo 4/21 LVEF 40%.  RV ok. Mild MR - Echo 5/21 LVEF 35-40%. RV ok, moderate MR - Admitted w/ NYHA Class IIIb symptoms w/ RHC demonstrating elevated filling pressures and low CO/CI. PCWP 27 mmHg. RA pressure 13 mmHg. CI 1.6 by FICK, 1.3 by Thermo - On Milrinone 0.125 mgc/kg/min. Co-ox 66% today. Continue current rate - Diuresing well w/  IV Lasix. Continue 80 mg bid - hold ? blocker w/ low output - add low dose spiro 12.5 mg qhs - BP too soft for Entersto. Consider low dose losartan 12.5 tomorrow if BP allows - RHC w/ prominent V-waves on PCWP tracing; query more severe mitral regurgitation than reported on echocardiogram in 02/2020. However repeat echo shows only moderate MR  2. CAD - LHC in 2017 showed moderate nonobstructive disease - LCH this admit w/ progressive disease: severe 2VD w/ 80-90% proximal LAD stenosis involving ostium of D1 (70% D1 stenosis), and sequential 50% ostial and proximal LCx lesions followed by 80% mid/distal LCx stenosis (similar in appearance to 2017). Moderate, diffuse RCA disease similar to 2017. - no chest pain but +exertional dyspnea - Plan to optimize from CHF standpoint and plan potential intervention of LAD prior to d/c. No plans to intervene on LCx given  relatively small territory  - Due to history of clopidogrel allergy, he would need to be treated with at least 3 months (ideally at least 6 months) of ticagrelor and warfarin, at which time transition to aspirin and warfarin could be considered. - continue statin - no ? blocker w/ low output    3. Mitral Regurgitation - Echo 02/2020 showed only mild MR - RHC w/  prominent V-waves on PCWP tracing; query more severe mitral regurgitation than reported on echocardiogram in 02/2020.  - 2D Echo repeatedtoday and felt to be moderate - suspect functional MR. Continue diuresis    4. H/o Aortic Stenosis w/ TAVR - s/p TAVR 07/2016 - Echo 4/21 showed stable prosthesis. Mean gradient 5 mmHg. - Stable on Echo this admit    5. Carotid Artery Disease  - s/p Rt CEA 2017  - last carotid dopplers 2018 showed patent RICA and mod (50-69%) Lt ICA stenosis - asymptomatic - continue medical therapy  - outpatient surveillance per gen cards   6. Chronic Afib - rate controlled, 80s-90s - monitor w/ milrinone. May need shorterm IV amio for rate control, but would avoid longterm use w/ concerns for possible pulmonary fibrosis  - continue coumadin per pharmacy   7. T2DM - poorly controlled. Hgb A1c 3/21 was 9.8  - hold metformin  - cover w/ SSI  - consider SGLT2i (GFR 44)   8. Stage III CKD - baseline SCr ~1.3-1.5 - stable at 1.4 today  - follow w/ diuresis   9. ? Pulmonary Fibrosis - CXR 4/21 showed Interstitial fibrotic changes in the bases - outpatient pulmonology referral pending     Length of Stay: Guys, PA-C  05/12/2020, 12:01 PM  Advanced Heart Failure Team Pager [phone] (M-F; 7a - 4p)  Please contact [facility] for night-coverage after hours (4p -7a ) and weekends on [email or web address]  Patient seen and examined with the above-signed Advanced Practice Provider and/or Housestaff. I personally reviewed laboratory data, imaging studies and relevant notes. I independently examined the patient and formulated the important aspects of the plan. I have edited the note to reflect any of my changes or salient points. I have personally discussed the plan with the patient and/or family.  Feels better on milrinone. Co-ox up to 66% Diuresing well. Wants to go home. No CP. Says breathing is better.   Echo shows EF 35-40% with significant LV  dyssynchrony. RV ok. Mild to moderate MR.   General:  Elderly male. Sitting up in bed. No resp difficulty HEENT: normal Neck: supple. JVP jaw Carotids 2+ bilat; no bruits. No lymphadenopathy or thryomegaly appreciated. Cor: PMI nondisplaced. Irregular rate & rhythm. No rubs, gallops or murmurs. Lungs: clear Abdomen: soft, nontender, nondistended. No hepatosplenomegaly. No bruits or masses. Good bowel sounds. Extremities: no cyanosis, clubbing, rash, 1+ edema Neuro: alert & orientedx3, cranial nerves grossly intact. moves all 4 extremities w/o difficulty. Affect pleasant but HOH and seems to perseverate at times  He is a poor historian but I had a long talk with his nephew Richardson Landry and Steve's wife [name]. They report at least a 6 month period of increasing weakness and progressive dyspnea though he was still able to do ADLs with full-time help at home. Cath yesterday showed high-grade LAD lesion with low output and markedly elevated filling pressures. Now improved with milrinone and diuresis. Although LAD lesion is tight, I doubt it is contributing significantly to his HF and I worry he will slip back eventually once milrinone is stopped. In  looking at echo he has significant LV dysynchony in terms of his LBBB but LBBB is not that wide (~190ms).  For now the plan will be to continue with milrinone and IV diuresis and then to decide whether or not he would benefit from CRT-P +/- LAD stent. Family wants to consider anything that might help him but are also very realistic about his overall condition. Will ask EP for their input on whether or not CRT might provide him some benefit.   [name], MD  3:00 PM

## 2020-05-12 NOTE — Progress Notes (Signed)
ANTICOAGULATION CONSULT NOTE - Follow Up Consult  Pharmacy Consult for heparin Indication: atrial fibrillation  Allergies  Allergen Reactions   Ambien [Zolpidem Tartrate] Other (See Comments)    Sleep walks   Lipitor [Atorvastatin Calcium] Other (See Comments)    myalgias   Ranitidine Other (See Comments)    Chest discomfort   Simvastatin Other (See Comments)    Myalgias   Xanax Xr [Alprazolam Er] Other (See Comments)    Tightness in chest   Cholestatin Other (See Comments)    UNSPECIFIED REACTION    Clopidogrel Bisulfate Rash    Patient Measurements: Height: 6\' 1"  (185.4 cm) Weight: 98.4 kg (217 lb) IBW/kg (Calculated) : 79.9  Vital Signs: Temp: 97.9 F (36.6 C) (06/29 0412) Temp Source: Oral (06/29 0412) BP: 130/64 (06/29 0412) Pulse Rate: 87 (06/29 0217)  Labs: Recent Labs    05/11/20 0607 05/11/20 1334 05/11/20 1334 05/11/20 1340 05/12/20 0500  HGB  --  14.3   < > 13.9 13.8  HCT  --  42.0  --  41.0 42.3  PLT  --   --   --   --  188  LABPROT 13.8  --   --   --   --   INR 1.1  --   --   --   --   HEPARINUNFRC  --   --   --   --  0.36   < > = values in this interval not displayed.    Estimated Creatinine Clearance: 46 mL/min (A) (by C-G formula based on SCr of 1.45 mg/dL (H)).  Assessment: 86 yo man s/p cath on heparin. Heparin level this am 0.36 units/ml  Goal of Therapy:  Heparin level 0.3-0.7 units/ml Monitor platelets by anticoagulation protocol: Yes   Plan:  Continue heparin at 1200 units/hr Check heparin level later today to confirm  Wyeth Hoffer Poteet 05/12/2020,6:30 AM

## 2020-05-12 NOTE — Plan of Care (Signed)
  Problem: Pain Managment: Goal: General experience of comfort will improve Outcome: Progressing   Problem: Clinical Measurements: Goal: Ability to maintain clinical measurements within normal limits will improve Outcome: Progressing   

## 2020-05-12 NOTE — Progress Notes (Signed)
°   05/12/20 0034  Assess: MEWS Score  Temp 97.7 F (36.5 C)  BP 133/87  ECG Heart Rate (!) 113  Resp (!) 22  SpO2 93 %  O2 Device Room Air  Assess: MEWS Score  MEWS Temp 0  MEWS Systolic 0  MEWS Pulse 2  MEWS RR 1  MEWS LOC 0  MEWS Score 3  MEWS Score Color Yellow  Assess: if the MEWS score is Yellow or Red  Were vital signs taken at a resting state? Yes  Focused Assessment Documented focused assessment  Early Detection of Sepsis Score *See Row Information* Low  MEWS guidelines implemented *See Row Information* Yes  Treat  MEWS Interventions Escalated (See documentation below)  Take Vital Signs  Increase Vital Sign Frequency  Yellow: Q 2hr X 2 then Q 4hr X 2, if remains yellow, continue Q 4hrs  Escalate  MEWS: Escalate Yellow: discuss with charge nurse/RN and consider discussing with provider and RRT  Notify: Charge Nurse/RN  Name of Charge Nurse/RN Notified Jamal Collin RN  Date Charge Nurse/RN Notified 05/12/20  Time Charge Nurse/RN Notified 0045  Notify: Provider  Provider Name/Title Dr ROSE  Date Provider Notified 05/12/20  Time Provider Notified 0045  Notification Type Page  Notification Reason Change in status  Response Other (Comment) (MD to evaluate meds.)  Date of Provider Response 05/12/20  Time of Provider Response [phone]

## 2020-05-12 NOTE — TOC Initial Note (Signed)
Transition of Care [facility]) - Initial/Assessment Note    Patient Details  Name: [name] [name] MRN: [id number] Date of Birth: 21-Sep-1932  Transition of Care [facility]) CM/SW Contact:    Emeterio Reeve, [place] Phone Number: 05/12/2020, 3:36 PM  Clinical Narrative:                  CSW spoke to pts nephew Richardson Landry on phone. CSW introduced self and explained her role at the hospital. Pt stated that PTA pt was living at home alone. Pt was walking short distances with a walker.Richardson Landry stated pt was often short of breath after walking and activities. Richardson Landry stated that pt had aides that came in to help cook, clean, and preform ADL's.   CSW reviewed PT/OT reccs with steve. Richardson Landry agrees that pt will need snf. Richardson Landry stated he is ok moving forward with SNF but believes that when pt is more oriented he may deny snf. CSW explained that the plan is not permanent.   CSW will continue to follow.    Expected Discharge Plan: Skilled Nursing Facility Barriers to Discharge: Continued Medical Work up   Patient Goals and CMS Choice Patient states their goals for this hospitalization and ongoing recovery are:: PT did not verbalize goals CMS Medicare.gov Compare Post Acute Care list provided to:: Patient Choice offered to / list presented to : Patient  Expected Discharge Plan and Services Expected Discharge Plan: [place]       Living arrangements for the past 2 months: Single Family Home                                      Prior Living Arrangements/Services Living arrangements for the past 2 months: Single Family Home Lives with:: Self Patient language and need for interpreter reviewed:: Yes Do you feel safe going back to the place where you live?: Yes      Need for Family Participation in Patient Care: Yes (Comment) Care giver support system in place?: Yes (comment) Current home services: DME, Homehealth aide Criminal Activity/Legal Involvement Pertinent to Current  Situation/Hospitalization: No - Comment as needed  Activities of Daily Living Home Assistive Devices/Equipment: Walker (specify type), CBG Meter, Dentures (specify type) ADL Screening (condition at time of admission) Patient's cognitive ability adequate to safely complete daily activities?: Yes Is the patient deaf or have difficulty hearing?: No Does the patient have difficulty seeing, even when wearing glasses/contacts?: No Does the patient have difficulty concentrating, remembering, or making decisions?: No Patient able to express need for assistance with ADLs?: Yes Does the patient have difficulty dressing or bathing?: No Independently performs ADLs?: No Communication: Independent Dressing (OT): Independent Grooming: Independent Feeding: Needs assistance Is this a change from baseline?: Pre-admission baseline Bathing: Needs assistance Is this a change from baseline?: Pre-admission baseline Toileting: Independent In/Out Bed: Independent Walks in Home: Needs assistance Is this a change from baseline?: Pre-admission baseline Does the patient have difficulty walking or climbing stairs?: Yes Weakness of Legs: Both Weakness of Arms/Hands: None  Permission Sought/Granted Permission sought to share information with : Facility Sport and exercise psychologist, Family Supports Permission granted to share information with : Yes, Verbal Permission Granted     Permission granted to share info w AGENCY: SNF        Emotional Assessment Appearance:: Appears stated age Attitude/Demeanor/Rapport: Unable to Assess Affect (typically observed): Unable to Assess Orientation: : Oriented to Self, Oriented to Place, Oriented to  Time, Oriented to Situation Alcohol / Substance Use: Not Applicable Psych Involvement: No (comment)  Admission diagnosis:  Acute on chronic HFrEF (heart failure with reduced ejection fraction) (HCC) [I50.23] Acute on chronic systolic heart failure, NYHA class 3 (HCC)  [I50.23] Patient Active Problem List   Diagnosis Date Noted  . Acute on chronic HFrEF (heart failure with reduced ejection fraction) ([place]) 05/11/2020  . Acute on chronic systolic heart failure, NYHA class 3 ([place]) 05/11/2020  . Senile purpura ([place]) 08/15/2018  . Renal insufficiency 03/14/2018  . Altered mental status 02/21/2017  . Congestive heart failure (CHF) ([place]) 02/09/2017  . Chronic systolic heart failure ([place]) 02/09/2017  . Controlled type 2 diabetes mellitus with hyperglycemia ([place]) 02/09/2017  . Elevated troponin   . S/P TAVR (transcatheter aortic valve replacement) 07/19/2016  . Mitral regurgitation   . VT (ventricular tachycardia) ([place])   . Cardiogenic shock ([place])   . Acute respiratory failure with hypoxia ([place])   . Shortness of breath 06/28/2016  . CAD (coronary artery disease), native coronary artery 06/28/2016  . Acute on chronic combined systolic and diastolic CHF, NYHA class 4 ([place])   . Anemia of chronic disease 04/12/2016  . Orthostatic hypotension 08/20/2015  . AKI (acute kidney injury) ([place]) 08/20/2015  . Asthma exacerbation 08/20/2015  . Cognitive dysfunction 05/29/2015  . Chronic back pain 07/29/2014  . Lumbar pain 06/23/2014  . Chronic pain of right ankle 05/30/2014  . Diabetic neuropathy ([place]) 05/30/2014  . Chest congestion 05/02/2013  . Wheezing 05/02/2013  . Unspecified constipation 04/30/2013  . Leg edema, left 04/27/2013  . Carotid stenosis 04/25/2012  . BPH (benign prostatic hypertrophy) with urinary obstruction 09/27/2011  . Dyspnea on exertion 08/01/2011  . Diabetes mellitus ([place]) 07/14/2011  . Hypertension   . Atrial fibrillation ([place])   . Hyperlipidemia   . History of noncompliance with medical treatment   . Aortic stenosis   . Cerebrovascular disease 07/15/2010   PCP:  Kathyrn Drown, MD Pharmacy:   [facility] [address] [address] Phone: [phone] Fax:  [phone]  [facility], [address] [place] [address] [place] [address] Phone: [phone] Fax: [phone]     Social Determinants of Health (SDOH) Interventions    Readmission Risk Interventions No flowsheet data found.  Emeterio Reeve, Latanya Presser, [place] Social Worker [phone]

## 2020-05-12 NOTE — Plan of Care (Addendum)
HR persistently 120-130s. Starting amiodarone 1mg /min without bolus for rate control as he is on milrinone.

## 2020-05-12 NOTE — Evaluation (Signed)
Physical Therapy Evaluation Patient Details Name: [name] [name] MRN: [id number] DOB: Apr 22, 1932 Today's Date: 05/12/2020   History of Present Illness  Patient is a 86 y/o male who presents with SOB and admitted with acute on chronic CHF. s/p cardiac cath 6/28. PMH includes TAVR, systolic CHF, A-fib, HTN, DM, CVA, PVD, CKD,  Clinical Impression  Patient presents with generalized weakness, impaired balance, decreased activity tolerance, impaired cognition and impaired mobility s/p above. Pt lives alone but has CNAs around the clock (except for a short time when changing shifts) and uses RW vs SPC for ambulation PTA. Has support from niece as well with IADLs and driving errands. Today, pt requires Min A for standing and taking a few steps in the room. Noted to have an episode of SOB lasting <20 seconds correlating with vtach, which resolved. RN aware. Encouraged OOB mobility while in the hospital. Also noted to have some cognitive deficits relating to memory, problem solving, safety awareness. Pt reports hx of falls and continues to be a fall risk. Recommend supervision/hands on assist for OOB mobility. Will follow acutely to maximize independence and mobility prior to return home.    Follow Up Recommendations Home health PT;Supervision for mobility/OOB    Equipment Recommendations  None recommended by PT    Recommendations for Other Services       Precautions / Restrictions Precautions Precautions: Fall Precaution Comments: watch HR Restrictions Weight Bearing Restrictions: No      Mobility  Bed Mobility Overal bed mobility: Needs Assistance Bed Mobility: Supine to Sit     Supine to sit: Min guard;HOB elevated     General bed mobility comments: Increased time and use of rails to get to EOB. Dizziness sitting EOB, Bp soft but stable.  Transfers Overall transfer level: Needs assistance Equipment used: Rolling walker (2 wheeled) Transfers: Sit to/from [employer] Sit to Stand: Min assist Stand pivot transfers: Min assist       General transfer comment: Assist to power to standing with cues for momentum and hand placement. Stood from [employer], able to take a few steps to get to chair with RW. Needed directional cues as pt attempting to walk the forwards towards the chair instead of turning bottom around to sit. Noted to have an episode of vtach and 3/4 DOE for <20 seconds. Cues to slow down breathing. RN aware.  Ambulation/Gait             General Gait Details: Deferred due to above.  Stairs            Wheelchair Mobility    Modified Rankin (Stroke Patients Only)       Balance Overall balance assessment: Needs assistance Sitting-balance support: Feet supported;No upper extremity supported Sitting balance-Leahy Scale: Good     Standing balance support: During functional activity Standing balance-Leahy Scale: Poor Standing balance comment: Requires UE support in standing.                             Pertinent Vitals/Pain Pain Assessment: No/denies pain    Home Living Family/patient expects to be discharged to:: Private residence Living Arrangements: Alone Available Help at Discharge: Personal care attendant;Available 24 hours/day;Family (has 4 CNAs around the clock except for a few hours during shift change) Type of Home: House Home Access: Stairs to enter Entrance Stairs-Rails: Right Entrance Stairs-Number of Steps: 4-5 Home Layout: One level;Laundry or work area in basement (1 flight to basement) Home Equipment:  Walker - 2 wheels;Wheelchair - manual;Cane - single point      Prior Function Level of Independence: Needs assistance   Gait / Transfers Assistance Needed: uses RW vs SPC for ambulation.  ADL's / Homemaking Assistance Needed: Aides assist with IADLs and getting in/out of bath.  Comments: Has a niece that helps as well with grocery shopping and other errands.     Hand Dominance         Extremity/Trunk Assessment   Upper Extremity Assessment Upper Extremity Assessment: Defer to OT evaluation    Lower Extremity Assessment Lower Extremity Assessment: Generalized weakness       Communication   Communication: HOH  Cognition Arousal/Alertness: Awake/alert Behavior During Therapy: WFL for tasks assessed/performed Overall Cognitive Status: No family/caregiver present to determine baseline cognitive functioning                                 General Comments: Oriented to self, roughly to place with some cues. Impaired memory. Perseverates about thinking he can go home. Slow processing. Per nurse, somewhat confused today.      General Comments General comments (skin integrity, edema, etc.): HR into vtach for a short time with pt becoming symptomatic with SOB. Resolved.    Exercises     Assessment/Plan    PT Assessment Patient needs continued PT services  PT Problem List Decreased strength;Decreased mobility;Decreased safety awareness;Decreased balance;Decreased activity tolerance;Decreased cognition;Cardiopulmonary status limiting activity       PT Treatment Interventions Therapeutic activities;Gait training;Therapeutic exercise;Patient/family education;Balance training;Functional mobility training;Stair training    PT Goals (Current goals can be found in the Care Plan section)  Acute Rehab PT Goals Patient Stated Goal: to go home PT Goal Formulation: With patient Time For Goal Achievement: 05/26/20 Potential to Achieve Goals: Good    Frequency Min 3X/week   Barriers to discharge        Co-evaluation               AM-PAC PT "6 Clicks" Mobility  Outcome Measure Help needed turning from your back to your side while in a flat bed without using bedrails?: None Help needed moving from lying on your back to sitting on the side of a flat bed without using bedrails?: A Little Help needed moving to and from a bed to a chair (including  a wheelchair)?: A Little Help needed standing up from a chair using your arms (e.g., wheelchair or bedside chair)?: A Little Help needed to walk in hospital room?: A Little Help needed climbing 3-5 steps with a railing? : A Lot 6 Click Score: 18    End of Session Equipment Utilized During Treatment: Gait belt Activity Tolerance: Patient tolerated treatment well Patient left: in chair;with call bell/phone within reach;with nursing/sitter in room;Other (comment) (alarm pad under chair but no box, RN aware) Nurse Communication: Mobility status;Other (comment) (HR, episode of SOB) PT Visit Diagnosis: Muscle weakness (generalized) (M62.81);Difficulty in walking, not elsewhere classified (R26.2)    Time: 9147-8295 PT Time Calculation (min) (ACUTE ONLY): 23 min   Charges:   PT Evaluation $PT Eval Moderate Complexity: 1 Mod PT Treatments $Therapeutic Activity: 8-22 mins        Marisa Severin, PT, DPT Acute Rehabilitation Services Pager [phone] Office [phone]      Marguarite Arbour A Sabra Heck 05/12/2020, 2:39 PM

## 2020-05-12 NOTE — Progress Notes (Addendum)
CCMD called RN. Pt having more frequent PVC's with widened QRS. CHF [place], [place] paged.    Rosita Fire, [place] came to assess pt at bedside. Amio bolus given. Will continue to monitor closely.

## 2020-05-12 NOTE — Consult Note (Addendum)
Cardiology Consultation:   Patient ID: ALTA SHOBER MRN: [id number]; DOB: [birth year]  Admit date: 05/11/2020 Date of Consult: 05/12/2020  Primary Care Provider: Kathyrn Drown, MD [facility] Cardiologist: Sherren Mocha, MD  [facility] Electrophysiologist:  None    Patient Profile:   [name] [name] is a 86 y.o. male with a hx of permanent AFib, chronic CHF (systolic), CAD, HTN, DM, HLD, unclear mention of possible pulm fibrosis, PVD with h/o right CEA, severe AS > TAVR 2017  who is being seen today for consideration of CRT therapy at the request of Dr. Haroldine Laws.  History of Present Illness:   [name] [name] was seen out patient with reports of worsening exertional capacity, noted a cxr with mention of fibrotic changes and is pending a pulmonary referral/evaluation.  He had an echo April 2021 with EF 40%, planned for [facility] to further evaluate his DOE. Cath noted severe 2VD w/ 80-90% proximal LAD stenosis involving ostium of D1 (70% D1 stenosis), and sequential 50% ostial and proximal LCx lesions followed by 80% mid/distal LCx stenosis (similar in appearance to 2017). Moderate, diffuse RCA disease similar to 2017. Right Heart Pressures RA (mean): 13 mmHg RV (S/EDP): 60/12 mmHg PA (S/D, mean): 60/30 (40) mmHg PCWP (mean): 27 mmHg with prominent V-waves  Ao sat: 98% PA sat: 55%  Fick CO: 3.4 L/min Fick CI: 1.6 L/min/m^2  Thermodilution CO: 2.8 L/min Thermodilution CI: 1.3 L/min/m^2   He was admitted with low output HF with plans to possibly pursue LAD intervention after optimized prior to discharge (no plans for Cx intervention given small territory)started on milrinone (and amio in effort to avoid RVR)  EKG has LBBB (old), Echo today noted LVEF 35-40%, global hypokinesis, LA described as severely dilated (measured 60m), RA mod dilated, RV was OK, mod MR, normal TAVR function  LABS [place] today 66 K+ 3.7 BUN/Creat 24/1.26 WBC 7.4 H/H 13/42 Plts 188   Past Medical  History:  Diagnosis Date  . Acute on chronic systolic CHF (congestive heart failure) ([place] 02/09/2017  . Acute respiratory failure with hypoxia ([place]   . AKI (acute kidney injury) ([place] 08/20/2015  . Altered mental status 02/21/2017  . Aortic stenosis    a. mod-sev by echo 05/2016.  . Asthma   . Asthma exacerbation 08/20/2015  . Atrial fibrillation (HCC)    Paroxysmal; Echo in 2007-normal EF; mild LVH; LA enlargement; mild AS, minimal AI; neg. stress nuclear study in 2008   . BPH (benign prostatic hypertrophy) with urinary obstruction 09/27/2011   Transurethral resection of the prostate in 09/2011   . CAD (coronary artery disease), native coronary artery 06/28/2016  . Cardiogenic shock ([place]   . Carotid stenosis 04/25/2012  . Cerebrovascular disease 07/2010   TIA; carotid ultrasound in 07/2010-significant bilateral plaque without focal internal carotid artery stenosis; MRI -encephalomalacia left temporal and right temporal lobes; small inferior right cerebellar infarct; small vessel disease  . Chronic atrial fibrillation (HCC)    Paroxysmal; Echocardiogram in 2007-normal EF; mild LVH; left atrial enlargement; mild stenosis and minimal AI; negative stress nuclear study in 2008  . Chronic systolic CHF (congestive heart failure) ([place]    a. dx 05/2016 - EF 35-40%, diffuse HK, mod-severe AS, mod gradient, severe AVA VTI likely due to decreased cardiac output in setting of systolic dysfsunction and significant mitral regurgitation, mild MR, mod-severe MR, severe LAE, mild-mod RV dilation, mild RAE, mild-mod TR, mod PASP 517mg.  . CKD (chronic kidney disease), stage II   . Cognitive dysfunction 05/29/2015  I believe that this is related to his age as well as previous stroke   . Congestive heart failure (CHF) ([place]) 02/09/2017  . Degenerative joint disease    feet and legs  . Diabetes mellitus    no insulin; A1c of 6.6 in 2005  . Dizziness    occurs daily,especially in am  . Elevated troponin   .  Exertional dyspnea   . Gastroesophageal reflux disease   . Hepatic steatosis   . History of noncompliance with medical treatment   . Hyperlipidemia    adverse reactions to statins and niacin  . Hypertension    Borderline  . Irregular heartbeat   . Mitral regurgitation    a. mod-sev by echo 05/2016  . Peripheral vascular disease ([place])   . Renal insufficiency   . S/P TAVR (transcatheter aortic valve replacement) 07/19/2016   29 mm Edwards Sapien 3 transcatheter heart valve placed via left percutaneous transfemoral approach  . Senile purpura ([place]) 08/15/2018  . Temporal arteritis ([place])   . Tricuspid regurgitation    a. mild-mod TR by echo 05/2016  . VT (ventricular tachycardia) ([place])     Past Surgical History:  Procedure Laterality Date  . COLONOSCOPY  2002  . COLONOSCOPY  01/19/2012   Procedure: COLONOSCOPY;  Surgeon: Rogene Houston, MD;  [facility];  Service: Endoscopy;  Laterality: N/A;  1030  . LIPOMA EXCISION  1980  . ORIF ANKLE FRACTURE  2000   Right  . PERIPHERAL VASCULAR CATHETERIZATION N/A 07/11/2016   Procedure: Carotid PTA/Stent Intervention;  Surgeon: Lorretta Harp, MD;  Location: [place] [facility];  Service: Cardiovascular;  Laterality: N/A;  . PROSTATE SURGERY  12/2011  . RIGHT HEART CATH AND CORONARY ANGIOGRAPHY N/A 05/11/2020   Procedure: RIGHT HEART CATH AND CORONARY ANGIOGRAPHY;  Surgeon: Nelva Bush, MD;  Location: [place] [facility];  Service: Cardiovascular;  Laterality: N/A;  . ROTATOR CUFF REPAIR     Right  . TEE WITHOUT CARDIOVERSION N/A 06/17/2016   Procedure: TRANSESOPHAGEAL ECHOCARDIOGRAM (TEE);  Surgeon: Jerline Pain, MD;  Location: [place];  Service: Cardiovascular;  Laterality: N/A;  . TEE WITHOUT CARDIOVERSION N/A 07/19/2016   Procedure: TRANSESOPHAGEAL ECHOCARDIOGRAM (TEE);  Surgeon: Sherren Mocha, MD;  Location: [place];  Service: Open Heart Surgery;  Laterality: N/A;  . TRANSCATHETER AORTIC VALVE REPLACEMENT, TRANSFEMORAL N/A  07/19/2016   Procedure: TRANSCATHETER AORTIC VALVE REPLACEMENT, TRANSFEMORAL;  Surgeon: Sherren Mocha, MD;  Location: [place];  Service: Open Heart Surgery;  Laterality: N/A;  . TRANSURETHRAL RESECTION OF PROSTATE  09/2011  . URETHRAL STRICTURE DILATATION  1980s     Home Medications:  Prior to Admission medications   Medication Sig Start Date End Date Taking? Authorizing Provider  acetaminophen (TYLENOL) 500 MG tablet Take 1,000 mg by mouth every 8 (eight) hours as needed for moderate pain.   Yes [provider]  albuterol (PROVENTIL) (2.5 MG/3ML) 0.083% nebulizer solution Take 3 mLs (2.5 mg total) by nebulization every 6 (six) hours as needed for wheezing or shortness of breath. 11/03/17  Yes Mikey Kirschner, MD  bisoprolol (ZEBETA) 5 MG tablet TAKE (1/2) TABLET BY MOUTH ONCE DAILY. Patient taking differently: Take 2.5 mg by mouth daily.  08/29/19  Yes Kathyrn Drown, MD  blood glucose meter kit and supplies Dispense based on patient and insurance preference. Use to test glucose three times daily.  (FOR ICD-10) 12/31/18  Yes Kathyrn Drown, MD  cholecalciferol (VITAMIN D3) 25 MCG (1000 UNIT) tablet Take 1,000  Units by mouth in the morning and at bedtime.   Yes [provider]  enoxaparin (LOVENOX) 150 MG/ML injection Inject 1 mL (150 mg total) into the skin daily. 05/05/20  Yes Sherren Mocha, MD  furosemide (LASIX) 20 MG tablet TAKE 1 TABLET BY MOUTH ON MONDAY, [place]. Patient taking differently: Take 20 mg by mouth every Monday, Wednesday, and Friday.  04/06/20  Yes Luking, Scott A, MD  insulin glargine (LANTUS SOLOSTAR) 100 UNIT/ML Solostar Pen INJECT 32 UNITS EVERY EVENING. Patient taking differently: Inject 32 Units into the skin every evening. INJECT 32 UNITS EVERY EVENING. 02/03/20  Yes Luking, Elayne Snare, MD  Melatonin 10 MG TABS Take 10 mg by mouth at bedtime.   Yes [provider]  metFORMIN (GLUCOPHAGE-XR) 500 MG 24 hr tablet TAKE 1 TABLET BY MOUTH  ONCE DAILY WITH BREAKFAST. Patient taking differently: Take 500 mg by mouth daily with breakfast.  12/13/19  Yes Luking, Elayne Snare, MD  nortriptyline (PAMELOR) 10 MG capsule TAKE 1 OR 2 CAPSULES AT BEDTIME FOR BURNING IN FEET.[28 IN PACKS/ NONE IN BOTTLE] Patient taking differently: Take 10 mg by mouth at bedtime.  04/24/20  Yes Luking, Elayne Snare, MD  potassium chloride SA (KLOR-CON) 20 MEQ tablet TAKE 1 TABLET BY MOUTH ON MONDAY, WEDNESDAY AND FRIDAY. Patient taking differently: Take 20 mEq by mouth every Monday, Wednesday, and Friday.  01/29/20  Yes Luking, Scott A, MD  psyllium (METAMUCIL) 58.6 % powder Take 1 packet by mouth daily.   Yes [provider]  rosuvastatin (CRESTOR) 5 MG tablet TAKE (1) TABLET BY MOUTH ONCE DAILY AT BEDTIME. Patient taking differently: Take 5 mg by mouth daily.  01/29/20  Yes Luking, Elayne Snare, MD  TRUEPLUS PEN NEEDLES 31G X 8 MM MISC USE WITH LANTUS SOLOSTAR EVERY EVENING 06/24/19  Yes Luking, Scott A, MD  warfarin (COUMADIN) 6 MG tablet TAKE 1 TABLET AS DIRECTED. Patient taking differently: Take 3-6 mg by mouth See admin instructions. 3 mg Sun Thurs Sat, 6 mg all other days 08/29/19  Yes Kathyrn Drown, MD    Inpatient Medications: Scheduled Meds: . albuterol  2.5 mg Nebulization Once  . Chlorhexidine Gluconate Cloth  6 each Topical Daily  . furosemide  80 mg Intravenous BID  . insulin aspart  0-9 Units Subcutaneous TID WC  . insulin glargine  32 Units Subcutaneous QPM  . melatonin  9 mg Oral QHS  . nortriptyline  10 mg Oral QHS  . psyllium  1 packet Oral Daily  . rosuvastatin  5 mg Oral Daily  . sodium chloride flush  10-40 mL Intracatheter Q12H  . sodium chloride flush  3 mL Intravenous Q12H  . sodium chloride flush  3 mL Intravenous Q12H  . spironolactone  12.5 mg Oral QHS   Continuous Infusions: . sodium chloride    . sodium chloride    . amiodarone 60 mg/hr (05/12/20 1427)  . heparin 1,200 Units/hr (05/11/20 2158)  . milrinone 0.125 mcg/kg/min  (05/12/20 0734)   PRN Meds: sodium chloride, sodium chloride, acetaminophen, albuterol, levalbuterol, ondansetron (ZOFRAN) IV, sodium chloride flush, sodium chloride flush, sodium chloride flush  Allergies:    Allergies  Allergen Reactions  . Ambien [Zolpidem Tartrate] Other (See Comments)    Sleep walks  . Lipitor [Atorvastatin Calcium] Other (See Comments)    myalgias  . Ranitidine Other (See Comments)    Chest discomfort  . Simvastatin Other (See Comments)    Myalgias  . Xanax Starla Link Er] Other (See  Comments)    Tightness in chest  . Cholestatin Other (See Comments)    UNSPECIFIED REACTION   . Clopidogrel Bisulfate Rash    Social History:   Social History   Socioeconomic History  . Marital status: Widowed    Spouse name: Not on file  . Number of children: 1  . Years of education: Not on file  . Highest education level: Not on file  Occupational History  . Occupation: Retired  Tobacco Use  . Smoking status: Former Smoker    Packs/day: 1.00    Years: 20.00    Pack years: 20.00    Types: Cigarettes    Start date: 07/04/1950    Quit date: 04/25/1992    Years since quitting: 28.0  . Smokeless tobacco: Current User    Types: Chew  Substance and Sexual Activity  . Alcohol use: No    Alcohol/week: 0.0 standard drinks  . Drug use: No  . Sexual activity: Not Currently  Other Topics Concern  . Not on file  Social History Narrative  . Not on file   Social Determinants of Health   Financial Resource Strain:   . Difficulty of Paying Living Expenses:   Food Insecurity:   . Worried About Charity fundraiser in the Last Year:   . Arboriculturist in the Last Year:   Transportation Needs:   . Film/video editor (Medical):   [name] Lack of Transportation (Non-Medical):   Physical Activity:   . Days of Exercise per Week:   . Minutes of Exercise per Session:   Stress:   . Feeling of Stress :   Social Connections:   . Frequency of Communication with Friends and  Family:   . Frequency of Social Gatherings with Friends and Family:   . Attends Religious Services:   . Active Member of Clubs or Organizations:   . Attends Archivist Meetings:   [name] Marital Status:   Intimate Partner Violence:   . Fear of Current or Ex-Partner:   . Emotionally Abused:   [name] Physically Abused:   . Sexually Abused:     Family History:   Family History  Problem Relation Age of Onset  . Stroke Mother   . Diabetes Father   . Colon cancer Neg Hx      ROS:  Please see the history of present illness.  All other ROS reviewed and negative.     Physical Exam/Data:   Vitals:   05/12/20 0217 05/12/20 0412 05/12/20 0500 05/12/20 0735  BP: 126/66 130/64  119/76  Pulse: 87   90  Resp: (!) 23 (!) 22  (!) 22  Temp: 98 F (36.7 C) 97.9 F (36.6 C)  (!) 97.5 F (36.4 C)  TempSrc: Oral Oral  Oral  SpO2: 95% 92%  96%  Weight:   99.3 kg   Height:        Intake/Output Summary (Last 24 hours) at 05/12/2020 1521 Last data filed at 05/12/2020 1504 Gross per 24 hour  Intake 1256.97 ml  Output 5370 ml  Net -4113.03 ml   Last 3 Weights 05/12/2020 05/11/2020 04/01/2020  Weight (lbs) 218 lb 14.7 oz 217 lb 236 lb  Weight (kg) 99.3 kg 98.431 kg 107.049 kg     Body mass index is 28.88 kg/m.  General:  Well nourished, well developed, in no acute distress HEENT: normal Lymph: no adenopathy Neck: + JVD to jaw Endocrine:  No thryomegaly Vascular: No carotid bruits Cardiac:  irreg-irreg; soft  SM Lungs:  Clear b/l , no wheezing, rhonchi or rales  Abd: soft, nontender, obese  Ext: no edema Musculoskeletal:  No deformities, atrophy appropriate for age Skin: warm and dry  Neuro:  No gross focal motor abnormalities noted.  Psych:  Normal affect   EKG:  The EKG was personally reviewed and demonstrates:    Afib 65, LBBB (QRS 139) AFib 84bpm, LBBB(QRS 138)  Telemetry:  Telemetry was personally reviewed and demonstrates:   AFib generally CVR with some RVR earlier  today   Relevant CV Studies:  05/12/2020: TTE IMPRESSIONS  1. Left ventricular ejection fraction, by estimation, is 35 to 40%. The  left ventricle has moderately decreased function. The left ventricle  demonstrates global hypokinesis. Left ventricular diastolic function could  not be evaluated.  2. Right ventricular systolic function is normal. The right ventricular  size is normal. There is mildly elevated pulmonary artery systolic  pressure.  3. Left atrial size was severely dilated.  4. Right atrial size was moderately dilated.  5. The mitral valve is normal in structure. Moderate mitral valve  regurgitation.  6. Tricuspid valve regurgitation is moderate.  7. The aortic valve has been repaired/replaced. Aortic valve  regurgitation is not visualized. There is a 29 mm Edwards Sapien  prosthetic (TAVR) valve present in the aortic position. Echo findings are  consistent with normal structure and function of the  aortic valve prosthesis.  8. The inferior vena cava is normal in size with greater than 50%  respiratory variability, suggesting right atrial pressure of 3 mmHg.   Comparison(s): No significant change from prior study. Prior images  reviewed side by side.    Right Heart Pressures RA (mean): 13 mmHg RV (S/EDP): 60/12 mmHg PA (S/D, mean): 60/30 (40) mmHg PCWP (mean): 27 mmHg with prominent V-waves  Ao sat: 98% PA sat: 55%  Fick CO: 3.4 L/min Fick CI: 1.6 L/min/m^2  Thermodilution CO: 2.8 L/min Thermodilution CI: 1.3 L/min/m^2      Laboratory Data:  High Sensitivity Troponin:  No results for input(s): TROPONINIHS in the last 720 hours.   Chemistry Recent Labs  Lab 05/11/20 1334 05/11/20 1340 05/12/20 0500  NA 141 138 137  K 4.4 4.3 3.7  CL  --   --  98  CO2  --   --  26  GLUCOSE  --   --  185*  BUN  --   --  24*  CREATININE  --   --  1.46*  CALCIUM  --   --  9.6  GFRNONAA  --   --  43*  GFRAA  --   --  50*  ANIONGAP  --   --  13    No  results for input(s): PROT, ALBUMIN, AST, ALT, ALKPHOS, BILITOT in the last 168 hours. Hematology Recent Labs  Lab 05/11/20 1334 05/11/20 1340 05/12/20 0500  WBC  --   --  7.4  RBC  --   --  4.72  HGB 14.3 13.9 13.8  HCT 42.0 41.0 42.3  MCV  --   --  89.6  MCH  --   --  29.2  MCHC  --   --  32.6  RDW  --   --  14.3  PLT  --   --  188   BNP Recent Labs  Lab 05/11/20 2004  BNP 154.4*    DDimer No results for input(s): DDIMER in the last 168 hours.   Radiology/Studies:   DG CHEST PORT 1 VIEW Addendum  Date: 05/11/2020   ADDENDUM REPORT: 05/11/2020 21:41 ADDENDUM: The PICC line terminates at the cavoatrial junction. Electronically Signed   By: Constance Holster M.D.   On: 05/11/2020 21:41  Result Date: 05/11/2020 CLINICAL DATA:  Dyspnea EXAM: PORTABLE CHEST 1 VIEW COMPARISON:  02/14/2020 FINDINGS: The patient is status post TAVR. There is a well-positioned right-sided PICC line. The heart size remains enlarged. There are aortic calcifications. There is mild vascular congestion. There is a small right-sided pleural effusion. There is a hazy ill-defined opacity overlying the right lower lung zone. IMPRESSION: 1. Well-positioned right-sided PICC line. 2. Cardiomegaly with mild vascular congestion. 3. Hazy ill-defined opacity overlying the right lower lung zone may represent atelectasis or infiltrate. Electronically Signed: By: Constance Holster M.D. On: 05/11/2020 21:26     Assessment and Plan:   1. Low output failure     On milrinone     Spironolactone added today, plans for ARB tomorrow if able, not felt to have good enough BP for Entresto     Diuresing very well with lasix (cumulatively negative 4361m, weight charted as up a pound (likely inaccurate)   2018 LVEF 35-40% 02/2020 LVEF 40% This admission 35-40% LBBB, QRS not very wide   Dr. BHaroldine Lawsmentions "Although LAD lesion is tight, I doubt it is contributing significantly to his HF and I worry he will slip back  eventually once milrinone is stopped. In looking at echo he has significant LV dysynchony in terms of his LBBB but LBBB is not that wide (~1360m." "For now the plan will be to continue with milrinone and IV diuresis and then to decide whether or not he would benefit from CRT-P +/- LAD stent"   There has been mention in office notes that BP has been somewhat of a limiting factor for medicine titration, he was on BB prior to admission, nitrate was added recently. He saw HF team back in 2018, was on losartan then (but no a BB), 2019 he was on both and the ARB got dropped off since then, I can tell where/why  The patient was not completely oriented, told me was 1970 and that [place] was [place] able to be re-oriented and quite pleasent.  This apparently his baseline since here with some known dementia. He denied any CP, SOB, reports getting winded easily for "quite a while" There was no family present   Given QRS duration, advanced age and AFib, not sure he would benefit significantly from CRBig CreekDr. AlRayann Hemanill later today for final thoughts and recommendations     For questions or updates, please contact [facility] Please consult [email or web address] for contact info under    Signed, ReBaldwin JamaicaPA-C  05/12/2020 3:21 PM;   I have seen, examined the patient, and reviewed the above assessment and plan.  Changes to above are made where necessary.  On exam,elderly appearing, iRRR.  The patient has permanent afib, worsening CHF, and cognitive dysfunction.  He has LBBB but his QRS is not very wide. I do not think that he is very likely to receive clinical benefit.   In the setting of his afib, I also think that BIV pacing would be difficult.  We discussed CRT-P today and he is not interested.  He would like to avoid this if possible.  Given his advanced age and co morbidities, I would advise medical therapy at this time. He has been placed on IV amiodarone.  I agree  with Dr BeHaroldine Lawshat this is  not a good long term option for him.  Hopefully he can be started on beta blocker therapy as his CHF improves.  Digoxin could also be considered.  Electrophysiology team to see as needed while here. Please call with questions.    Co Sign: Thompson Grayer, MD 05/12/2020 9:20 PM

## 2020-05-12 NOTE — Progress Notes (Signed)
  Echocardiogram 2D Echocardiogram has been performed.  [name] [name] 05/12/2020, 11:00 AM

## 2020-05-12 NOTE — Progress Notes (Signed)
Was called to bedside for rapid Afib in 120s. Pt A&O. BP 125/77.   Has chronic afib at baseline w/ LBBB. On Milrinone 0.125 + amio gtt at 60 mg/hr.   Order given to rebolus w/ 150 mg IV amio. Continue drip at 60 mg/hr.   Lyda Jester, PA-C

## 2020-05-13 LAB — BASIC METABOLIC PANEL
Anion gap: 14 (ref 5–15)
BUN: 25 mg/dL — ABNORMAL HIGH (ref 8–23)
CO2: 28 mmol/L (ref 22–32)
Calcium: 9.7 mg/dL (ref 8.9–10.3)
Chloride: 92 mmol/L — ABNORMAL LOW (ref 98–111)
Creatinine, Ser: 1.54 mg/dL — ABNORMAL HIGH (ref 0.61–1.24)
GFR calc Af Amer: 47 mL/min — ABNORMAL LOW (ref >60)
GFR calc non Af Amer: 41 mL/min — ABNORMAL LOW (ref >60)
Glucose, Bld: 187 mg/dL — ABNORMAL HIGH (ref 70–99)
Potassium: 3.1 mmol/L — ABNORMAL LOW (ref 3.5–5.1)
Sodium: 134 mmol/L — ABNORMAL LOW (ref 135–145)

## 2020-05-13 LAB — GLUCOSE, CAPILLARY
Glucose-Capillary: 181 mg/dL — ABNORMAL HIGH (ref 70–99)
Glucose-Capillary: 259 mg/dL — ABNORMAL HIGH (ref 70–99)
Glucose-Capillary: 180 mg/dL — ABNORMAL HIGH (ref 70–99)
Glucose-Capillary: 175 mg/dL — ABNORMAL HIGH (ref 70–99)

## 2020-05-13 LAB — COOXEMETRY PANEL
Carboxyhemoglobin: 1.2 % (ref 0.5–1.5)
Methemoglobin: 0.6 % (ref 0.0–1.5)
O2 Saturation: 65.2 %
Total hemoglobin: 14.3 g/dL (ref 12.0–16.0)

## 2020-05-13 LAB — PROTIME-INR
INR: 1.1 (ref 0.8–1.2)
Prothrombin Time: 13.8 seconds (ref 11.4–15.2)

## 2020-05-13 LAB — HEPARIN LEVEL (UNFRACTIONATED): Heparin Unfractionated: 0.27 IU/mL — ABNORMAL LOW (ref 0.30–0.70)

## 2020-05-13 MED ORDER — POTASSIUM CHLORIDE CRYS ER 20 MEQ PO TBCR
40.0000 meq | EXTENDED_RELEASE_TABLET | ORAL | Status: AC
  Administered 2020-05-13 (×3): 40 meq via ORAL
  Filled 2020-05-13 (×3): qty 2

## 2020-05-13 MED ORDER — ASPIRIN EC 81 MG PO TBEC
81.0000 mg | DELAYED_RELEASE_TABLET | Freq: Every day | ORAL | Status: DC
  Administered 2020-05-13 – 2020-05-14 (×2): 81 mg via ORAL
  Filled 2020-05-13 (×2): qty 1

## 2020-05-13 MED ORDER — BISOPROLOL FUMARATE 5 MG PO TABS
2.5000 mg | ORAL_TABLET | Freq: Every day | ORAL | Status: DC
  Administered 2020-05-13 – 2020-05-14 (×2): 2.5 mg via ORAL
  Filled 2020-05-13 (×2): qty 1

## 2020-05-13 MED ORDER — WARFARIN SODIUM 7.5 MG PO TABS
7.5000 mg | ORAL_TABLET | Freq: Once | ORAL | Status: AC
  Administered 2020-05-13: 7.5 mg via ORAL
  Filled 2020-05-13: qty 1

## 2020-05-13 MED ORDER — FUROSEMIDE 20 MG PO TABS
20.0000 mg | ORAL_TABLET | Freq: Every day | ORAL | Status: DC
  Administered 2020-05-14: 20 mg via ORAL
  Filled 2020-05-13: qty 1

## 2020-05-13 MED ORDER — WARFARIN - PHARMACIST DOSING INPATIENT: Freq: Every day | Status: DC

## 2020-05-13 NOTE — Progress Notes (Signed)
Inpatient Diabetes Program Recommendations  AACE/ADA: New Consensus Statement on Inpatient Glycemic Control (2015)  Target Ranges:  Prepandial:   less than 140 mg/dL      Peak postprandial:   less than 180 mg/dL (1-2 hours)      Critically ill patients:  140 - 180 mg/dL   Lab Results  Component Value Date   GLUCAP 259 (H) 05/13/2020   HGBA1C 7.8 (H) 05/11/2020    Review of Glycemic Control Results for [name] [name], [name] A "B A" (MRN [id number]) as of 05/13/2020 12:52  Ref. Range 05/12/2020 16:05 05/12/2020 21:02 05/13/2020 07:31 05/13/2020 11:32  Glucose-Capillary Latest Ref Range: 70 - 99 mg/dL 248 (H) 214 (H) 181 (H) 259 (H)   Diabetes history: Type 2 DM Outpatient Diabetes medications: Metformin 500 mg QAM, Lantus 32 units QPM Current orders for Inpatient glycemic control: Lantus 32 units QPM, Novolog 0-9 units TID  Inpatient Diabetes Program Recommendations:    Consider adding Novolog 3 units TID (assuming patient is consuming >50% of meal).   Thanks, Bronson Curb, MSN, RNC-OB Diabetes Coordinator [phone] (8a-5p)

## 2020-05-13 NOTE — TOC Progression Note (Signed)
Transition of Care [facility]) - Progression Note    Patient Details  Name: DAVION FLANNERY MRN: [id number] Date of Birth: 1932-10-24  Transition of Care [facility] CM/SW [place], [place] Phone Number: 05/13/2020, 12:01 PM  Clinical Narrative:     4pm- Pt was reseen by pt and the recommendation is now snf for rehab. Pt has been accepted to [facility]. CSW reached out to Health teams for Auth for SNF and PTAR.  12pm- CSW reached out to pts nephew steve via phone then [place] in room. CSW informed Richardson Landry and [name] that the PT recommendation is Home with [facility]. Donita Brooks both agree that pt would benefit from SNF for rehab before returning home.   [name] stated that pt has aides from 7am-2pm, 4pm- 10pm and [place] stated he could benefit from rehab before returning home.   CSW reach out to PT for the pt to be re seen.    Expected Discharge Plan: [place] Barriers to Discharge: Continued Medical Work up  Expected Discharge Plan and Services Expected Discharge Plan: [place] arrangements for the past 2 months: Single Family Home                                       Social Determinants of Health (SDOH) Interventions    Readmission Risk Interventions No flowsheet data found.

## 2020-05-13 NOTE — Progress Notes (Signed)
Physical Therapy Treatment Patient Details Name: [name] [name] MRN: [id number] DOB: November 22, 1931 Today's Date: 05/13/2020    History of Present Illness Patient is a 86 y/o male who presents with SOB and admitted with acute on chronic CHF. s/p cardiac cath 6/28. PMH includes TAVR, systolic CHF, A-fib, HTN, DM, CVA, PVD, CKD,    PT Comments    Patient progressing to hallway ambulation this session and demonstrates somewhat improved activity tolerance, though fatigued and demonstrated increased foot shuffling and decreased trunk extension with higher risk for falls.  Niece in the room and discussed with pt plan for follow up inpatient post acute care at SNF close to his home.  Patient agreeable, though tends to forget even during conversation plans and reasoning needing frequent reminders.  Patient will continue to benefit from skilled PT during acute stay.     Follow Up Recommendations  SNF     Equipment Recommendations  None recommended by PT    Recommendations for Other Services       Precautions / Restrictions Precautions Precautions: Fall Precaution Comments: watch HR Restrictions Weight Bearing Restrictions: No    Mobility  Bed Mobility               General bed mobility comments: up in chair  Transfers Overall transfer level: Needs assistance Equipment used: Rolling walker (2 wheeled) Transfers: Sit to/from Stand Sit to Stand: Mod assist         General transfer comment: lifting help from recliner for sit to stand  Ambulation/Gait Ambulation/Gait assistance: Min assist Gait Distance (Feet): 150 Feet Assistive device: Rolling walker (2 wheeled) Gait Pattern/deviations: Step-through pattern;Decreased stride length;Shuffle;Trunk flexed     General Gait Details: min A for safety. balance, walker proximity, noted decreased foot clearance and increased anterior lean with increaed distance and fatigue   Stairs             Wheelchair Mobility     Modified Rankin (Stroke Patients Only)       Balance Overall balance assessment: Needs assistance Sitting-balance support: Feet supported Sitting balance-Leahy Scale: Good     Standing balance support: Bilateral upper extremity supported Standing balance-Leahy Scale: Poor Standing balance comment: Requires UE support in standing.                            Cognition Arousal/Alertness: Awake/alert Behavior During Therapy: WFL for tasks assessed/performed Overall Cognitive Status: Impaired/Different from baseline Area of Impairment: Orientation;Memory;Safety/judgement                 Orientation Level: Disoriented to;Situation;Time   Memory: Decreased short-term memory Following Commands: Follows one step commands with increased time;Follows one step commands consistently Safety/Judgement: Decreased awareness of safety;Decreased awareness of deficits Awareness: Emergent Problem Solving: Slow processing General Comments: Pt is disoriented at this time, asking to call wife (whom has been deceased for some time). Needs consistent cues to redirect to task. increased time, cues, processing needed for basic mobility      Exercises      General Comments General comments (skin integrity, edema, etc.): HR max 90 with ambulation, patient fatigued, discussed short term rehab with neice in attendance, patient agreeable, but forgets easily why he needs it.      Pertinent Vitals/Pain Pain Assessment: Faces Faces Pain Scale: Hurts a little bit Pain Location: R heel (chronic) Pain Descriptors / Indicators: Aching Pain Intervention(s): Monitored during session;Repositioned    Home Living Family/patient expects to be discharged to::  Private residence Living Arrangements: Alone Available Help at Discharge: Personal care attendant;Available 24 hours/day;Family (has CNA most every hour except for shift change) Type of Home: House Home Access: Stairs to enter Entrance  Stairs-Rails: Right Home Layout: One level;Laundry or work area in [place]: Environmental consultant - 2 wheels;Wheelchair - manual;Cane - single point      Prior Function Level of Independence: Needs assistance  Gait / Transfers Assistance Needed: uses RW vs SPC for ambulation. ADL's / Homemaking Assistance Needed: Aides assist with IADLs and getting in/out of bath. Comments: Has a niece that helps as well with grocery shopping and other errands.   PT Goals (current goals can now be found in the care plan section) Acute Rehab PT Goals Patient Stated Goal: to go home Progress towards PT goals: Progressing toward goals    Frequency           PT Plan Discharge plan needs to be updated    Co-evaluation              AM-PAC PT "6 Clicks" Mobility   Outcome Measure  Help needed turning from your back to your side while in a flat bed without using bedrails?: None Help needed moving from lying on your back to sitting on the side of a flat bed without using bedrails?: A Little Help needed moving to and from a bed to a chair (including a wheelchair)?: A Little Help needed standing up from a chair using your arms (e.g., wheelchair or bedside chair)?: A Lot Help needed to walk in hospital room?: A Little Help needed climbing 3-5 steps with a railing? : A Lot 6 Click Score: 17    End of Session   Activity Tolerance: Patient limited by fatigue Patient left: in chair;with chair alarm set;with family/visitor present   PT Visit Diagnosis: Other abnormalities of gait and mobility (R26.89);Other symptoms and signs involving the nervous system (R29.898);Muscle weakness (generalized) (M62.81)     Time: 1410-1440 PT Time Calculation (min) (ACUTE ONLY): 30 min  Charges:  $Gait Training: 8-22 mins $Self Care/Home Management: [place] FXJOI:[phone] Office:[phone] 05/13/2020    Reginia Naas 05/13/2020, 3:23  PM

## 2020-05-13 NOTE — Progress Notes (Addendum)
Advanced Heart Failure Rounding Note  PCP-Cardiologist: Sherren Mocha, MD   Subjective:   Has been on milrinone + amio drip. Diuresed with IV lasix. Negative 1.3  Liters. CVP down to 6.   Confused this morning. He disconnected milrinone.   Sitting in the chair. Asking to call his wife (deceased). Denies SOB.  Echo repeated today. LVEF 35-40%. RV normal. MR moderate. Prosthetic aortic valve function is ok.    Echo 05/12/20 1. Left ventricular ejection fraction, by estimation, is 35 to 40%. The left ventricle has moderately decreased function. The left ventricle demonstrates global hypokinesis. Left ventricular diastolic function could not be evaluated. 2. Right ventricular systolic function is normal. The right ventricular size is normal. There is mildly elevated pulmonary artery systolic pressure. 3. Left atrial size was severely dilated. 4. Right atrial size was moderately dilated. 5. The mitral valve is normal in structure. Moderate mitral valve regurgitation. 6. Tricuspid valve regurgitation is moderate. 7. The aortic valve has been repaired/replaced. Aortic valve regurgitation is not visualized. There is a 29 mm Edwards Sapien prosthetic (TAVR) valve present in the aortic position. Echo findings are consistent with normal structure and function of the aortic valve prosthesis. 8. The inferior vena cava is normal in size with greater than 50% respiratory variability, suggesting right atrial pressure of 3 mmHg.  Objective:   Weight Range: 98 kg Body mass index is 28.5 kg/m.   Vital Signs:   Temp:  [97.8 F (36.6 C)-98.1 F (36.7 C)] 97.9 F (36.6 C) (06/30 0732) Pulse Rate:  [84-102] 85 (06/30 0732) Resp:  [15-21] 15 (06/30 0732) BP: (118-153)/(69-92) 153/70 (06/30 0732) SpO2:  [93 %-100 %] 100 % (06/30 0732) Weight:  [98 kg] 98 kg (06/30 0347) Last BM Date: 05/10/20  Weight change: Filed Weights   05/11/20 0557 05/12/20 0500 05/13/20 0347  Weight: 98.4 kg  99.3 kg 98 kg    Intake/Output:   Intake/Output Summary (Last 24 hours) at 05/13/2020 1046 Last data filed at 05/13/2020 0600 Gross per 24 hour  Intake 1686.09 ml  Output 3270 ml  Net -1583.91 ml      Physical Exam  CVP 6.  General:  Sitting in the chair. No resp difficulty HEENT: normal Neck: supple. no JVD. Carotids 2+ bilat; no bruits. No lymphadenopathy or thryomegaly appreciated. Cor: PMI nondisplaced. Irregular rate & rhythm. No rubs, gallops or murmurs. Lungs: clear Abdomen: soft, nontender, nondistended. No hepatosplenomegaly. No bruits or masses. Good bowel sounds. Extremities: no cyanosis, clubbing, rash, edema Neuro: alert & orientedx2 , cranial nerves grossly intact. moves all 4 extremities w/o difficulty. Affect pleasant   Telemetry  A fib 90s   EKG    No new EKG to review   Labs    CBC Recent Labs    05/11/20 1340 05/12/20 0500  WBC  --  7.4  HGB 13.9 13.8  HCT 41.0 42.3  MCV  --  89.6  PLT  --  656   Basic Metabolic Panel Recent Labs    05/12/20 0500 05/13/20 0509  NA 137 134*  K 3.7 3.1*  CL 98 92*  CO2 26 28  GLUCOSE 185* 187*  BUN 24* 25*  CREATININE 1.46* 1.54*  CALCIUM 9.6 9.7   Liver Function Tests No results for input(s): AST, ALT, ALKPHOS, BILITOT, PROT, ALBUMIN in the last 72 hours. No results for input(s): LIPASE, AMYLASE in the last 72 hours. Cardiac Enzymes No results for input(s): CKTOTAL, CKMB, CKMBINDEX, TROPONINI in the last 72 hours.  BNP:  BNP (last 3 results) Recent Labs    05/11/20 2004  BNP 154.4*    ProBNP (last 3 results) Recent Labs    04/01/20 1459  PROBNP 985*     D-Dimer No results for input(s): DDIMER in the last 72 hours. Hemoglobin A1C Recent Labs    05/11/20 2004  HGBA1C 7.8*   Fasting Lipid Panel No results for input(s): CHOL, HDL, LDLCALC, TRIG, CHOLHDL, LDLDIRECT in the last 72 hours. Thyroid Function Tests No results for input(s): TSH, T4TOTAL, T3FREE, THYROIDAB in the last 72  hours.  Invalid input(s): FREET3  Other results:   Imaging    ECHOCARDIOGRAM COMPLETE  Result Date: 05/12/2020    ECHOCARDIOGRAM REPORT   Patient Name:   [name] [name] Date of Exam: 05/12/2020 Medical Rec #:  [id number]      Height:       73.0 in Accession #:    [id number]     Weight:       218.9 lb Date of Birth:  [birth year]      BSA:          2.236 m Patient Age:    29 years       BP:           119/76 mmHg Patient Gender: M              HR:           94 bpm. Exam Location:  Inpatient Procedure: 2D Echo, Cardiac Doppler and Color Doppler Indications:    I50.41 Acute combined systolic (congestive) and diastolic                 (congestive) heart failure. Mitral valve insufficiency.  History:        Patient has prior history of Echocardiogram examinations, most                 recent 02/14/2020. CHF, CAD, Abnormal ECG, Aortic Valve Disease                 and Mitral Valve Disease, Arrythmias:Atrial Fibrillation,                 Signs/Symptoms:Hypotension, Altered Mental Status and Dyspnea;                 Risk Factors:Diabetes, Hypertension and Dyslipidemia. Aortic                 stenosis. Cardiac shock. S/P TAVR 60mm Edwards Sapien.                 Aortic Valve: 29 mm Edwards Sapien prosthetic, stented (TAVR)                 valve is present in the aortic position.  Sonographer:    Roseanna Rainbow RDCS Referring Phys: [place] Comments: Technically difficult study due to poor echo windows. IMPRESSIONS  1. Left ventricular ejection fraction, by estimation, is 35 to 40%. The left ventricle has moderately decreased function. The left ventricle demonstrates global hypokinesis. Left ventricular diastolic function could not be evaluated.  2. Right ventricular systolic function is normal. The right ventricular size is normal. There is mildly elevated pulmonary artery systolic pressure.  3. Left atrial size was severely dilated.  4. Right atrial size was moderately dilated.  5. The mitral  valve is normal in structure. Moderate mitral valve regurgitation.  6. Tricuspid valve regurgitation is moderate.  7. The aortic valve has been repaired/replaced. Aortic valve  regurgitation is not visualized. There is a 29 mm Edwards Sapien prosthetic (TAVR) valve present in the aortic position. Echo findings are consistent with normal structure and function of the aortic valve prosthesis.  8. The inferior vena cava is normal in size with greater than 50% respiratory variability, suggesting right atrial pressure of 3 mmHg. Comparison(s): No significant change from prior study. Prior images reviewed side by side. FINDINGS  Left Ventricle: Left ventricular ejection fraction, by estimation, is 35 to 40%. The left ventricle has moderately decreased function. The left ventricle demonstrates global hypokinesis. The left ventricular internal cavity size was normal in size. There is borderline concentric left ventricular hypertrophy. Abnormal (paradoxical) septal motion, consistent with left bundle branch block. Left ventricular diastolic function could not be evaluated due to atrial fibrillation. Left ventricular diastolic  function could not be evaluated. Right Ventricle: The right ventricular size is normal. No increase in right ventricular wall thickness. Right ventricular systolic function is normal. There is mildly elevated pulmonary artery systolic pressure. The tricuspid regurgitant velocity is 2.95  m/s, and with an assumed right atrial pressure of 3 mmHg, the estimated right ventricular systolic pressure is 59.5 mmHg. Left Atrium: Left atrial size was severely dilated. Right Atrium: Right atrial size was moderately dilated. Pericardium: There is no evidence of pericardial effusion. Mitral Valve: The mitral valve is normal in structure. There is mild thickening of the mitral valve leaflet(s). Mild mitral annular calcification. Moderate mitral valve regurgitation, with centrally-directed jet. MV peak gradient, 13.2  mmHg. The mean mitral valve gradient is 5.0 mmHg. Tricuspid Valve: The tricuspid valve is normal in structure. Tricuspid valve regurgitation is moderate. Aortic Valve: The aortic valve has been repaired/replaced. Aortic valve regurgitation is not visualized. Aortic valve mean gradient measures 6.0 mmHg. Aortic valve peak gradient measures 11.6 mmHg. Aortic valve area, by VTI measures 3.24 cm. There is a 29 mm Edwards Sapien prosthetic, stented (TAVR) valve present in the aortic position. Echo findings are consistent with normal structure and function of the aortic valve prosthesis. Pulmonic Valve: The pulmonic valve was normal in structure. Pulmonic valve regurgitation is not visualized. Aorta: The aortic root is normal in size and structure. Venous: The inferior vena cava is normal in size with greater than 50% respiratory variability, suggesting right atrial pressure of 3 mmHg. IAS/Shunts: No atrial level shunt detected by color flow Doppler.  LEFT VENTRICLE PLAX 2D LVIDd:         5.03 cm LVIDs:         4.36 cm LV PW:         1.19 cm LV IVS:        1.15 cm LVOT diam:     2.30 cm LV SV:         113 LV SV Index:   50 LVOT Area:     4.15 cm  LV Volumes (MOD) LV vol d, MOD A2C: 134.0 ml LV vol d, MOD A4C: 126.0 ml LV vol s, MOD A2C: 62.8 ml LV vol s, MOD A4C: 72.1 ml LV SV MOD A2C:     71.2 ml LV SV MOD A4C:     126.0 ml LV SV MOD BP:      65.2 ml RIGHT VENTRICLE             IVC RV S prime:     11.00 cm/s  IVC diam: 1.74 cm TAPSE (M-mode): 1.7 cm LEFT ATRIUM              Index  RIGHT ATRIUM           Index LA diam:        4.60 cm  2.06 cm/m  RA Area:     26.70 cm LA Vol (A2C):   158.0 ml 70.67 ml/m RA Volume:   93.70 ml  41.91 ml/m LA Vol (A4C):   85.9 ml  38.42 ml/m LA Biplane Vol: 117.0 ml 52.33 ml/m  AORTIC VALVE AV Area (Vmax):    3.98 cm AV Area (Vmean):   4.39 cm AV Area (VTI):     3.24 cm AV Vmax:           170.00 cm/s AV Vmean:          122.000 cm/s AV VTI:            0.347 m AV Peak Grad:       11.6 mmHg AV Mean Grad:      6.0 mmHg LVOT Vmax:         163.00 cm/s LVOT Vmean:        129.000 cm/s LVOT VTI:          0.271 m LVOT/AV VTI ratio: 0.78  AORTA Ao Root diam: 3.50 cm Ao Asc diam:  3.00 cm MITRAL VALVE                 TRICUSPID VALVE MV Area (PHT): 5.01 cm      TR Peak grad:   34.8 mmHg MV Peak grad:  13.2 mmHg     TR Vmax:        295.00 cm/s MV Mean grad:  5.0 mmHg MV Vmax:       1.82 m/s      SHUNTS MV Vmean:      91.6 cm/s     Systemic VTI:  0.27 m MV Decel Time: 151 msec      Systemic Diam: 2.30 cm MR Peak grad:    158.3 mmHg MR Mean grad:    101.0 mmHg MR Vmax:         629.00 cm/s MR Vmean:        477.0 cm/s MR PISA:         1.01 cm MR PISA Eff ROA: 6 mm MR PISA Radius:  0.40 cm MV E velocity: 165.33 cm/s Dani Gobble Croitoru MD Electronically signed by Sanda Klein MD Signature Date/Time: 05/12/2020/11:42:52 AM    Final      Medications:     Scheduled Medications: . albuterol  2.5 mg Nebulization Once  . Chlorhexidine Gluconate Cloth  6 each Topical Daily  . furosemide  80 mg Intravenous BID  . insulin aspart  0-9 Units Subcutaneous TID WC  . insulin glargine  32 Units Subcutaneous QPM  . melatonin  9 mg Oral QHS  . nortriptyline  10 mg Oral QHS  . psyllium  1 packet Oral Daily  . rosuvastatin  5 mg Oral Daily  . sodium chloride flush  10-40 mL Intracatheter Q12H  . sodium chloride flush  3 mL Intravenous Q12H  . sodium chloride flush  3 mL Intravenous Q12H  . spironolactone  12.5 mg Oral QHS    Infusions: . sodium chloride    . sodium chloride    . amiodarone 60 mg/hr (05/13/20 0526)  . heparin 1,300 Units/hr (05/13/20 0711)  . milrinone 0.125 mcg/kg/min (05/12/20 0734)    PRN Medications: sodium chloride, sodium chloride, acetaminophen, albuterol, levalbuterol, ondansetron (ZOFRAN) IV, sodium chloride flush, sodium chloride flush, sodium chloride flush   Assessment/Plan  1. Acute on Chronic Systolic Heart Failure w/ Low Output - Echo 4/21 LVEF 40%.  RV ok.  Mild MR - Echo 5/21 LVEF 35-40%. RV ok, moderate MR - Admitted w/ NYHA Class IIIb symptoms w/ RHC demonstrating elevated filling pressures and low CO/CI. PCWP 27 mmHg. RA pressure 13 mmHg. CI 1.6 by FICK, 1.3 by Thermo - CO-OX stable. Stop milrinone.  - CVP down to 6. Volume status much improved.  Stop IV lasix. Restart po lasix 20 mg daily.  - Restart bisoprolol 2.5 mg daily. Add 0.125 mg digoxin.  - Continue spironolactone 12.5 mg qhs - BP too soft for Entersto.  - RHC w/ prominent V-waves on PCWP tracing; query more severe mitral regurgitation than reported on echocardiogram in 02/2020. However repeat echo shows only moderate MR - EP consulted for possible CTR-P but not not thought to be beneficial.  2. CAD - LHC in 2017 showed moderate nonobstructive disease - LCH this admit w/ progressive disease: severe 2VD w/ 80-90% proximal LAD stenosis involving ostium of D1 (70% D1 stenosis), and sequential 50% ostial and proximal LCx lesions followed by 80% mid/distal LCx stenosis (similar in appearance to 2017). Moderate, diffuse RCA disease similar to 2017. - No chest pain.  - Dr Haroldine Laws discussed with Dr Burt Knack no plan for intervention.  - Can stop heparin drip.  - Start 81 mg asa . Continue statin.  -No plan for intervention of LAD or LCx given relatively small territory  - Due to history of clopidogrel allergy, he would need to be treated with at least 3 months (ideally at least 6 months) of ticagrelor and warfarin, at which time transition to aspirin and warfarin could be considered. - continue statin - Add 2.5 mg bisoprolol daily. On prior to admit.   3. Mitral Regurgitation - Echo 02/2020 showed only mild MR - RHC w/ prominent V-waves on PCWP tracing; query more severe mitral regurgitation than reported on echocardiogram in 02/2020.  - 2D Echo repeatedtoday and felt to be moderate - suspect functional MR. Continue diuresis   4. H/o Aortic Stenosis w/ TAVR - s/p TAVR 07/2016 -  Echo 4/21 showed stable prosthesis. Mean gradient 5 mmHg. - Stable on Echo this admit    5. Carotid Artery Disease  - s/p Rt CEA 2017  - last carotid dopplers 2018 showed patent RICA and mod (50-69%) Lt ICA stenosis - asymptomatic - continue medical therapy  - outpatient surveillance per gen cards   6. Chronic Afib - Stopping milrinone as noted above. Can stop IV amio as this was used for rate control.  - Restart bisoprolol 2.5 mg daily.   - Restart coumadin. Continue heparin drip while he is here.   7. T2DM - poorly controlled. Hgb A1c 3/21 was 9.8  - hold metformin  - cover w/ SSI  - consider SGLT2i (GFR 44)   8. Stage III CKD - baseline SCr ~1.3-1.5 - stable at 1.5  - follow w/ diuresis   9. ? Pulmonary Fibrosis - CXR 4/21 showed Interstitial fibrotic changes in the bases - outpatient pulmonology referral pending   TOC consulted to SNF. FL2 started.    Length of Stay: 2  Darrick Grinder, NP  05/13/2020, 10:46 AM  Advanced Heart Failure Team Pager [phone] (M-F; 7a - 4p)  Please contact [facility] for night-coverage after hours (4p -7a ) and weekends on [email or web address]  Patient seen and examined with the above-signed Advanced Practice Provider and/or Housestaff. I personally reviewed laboratory data, imaging studies and relevant  notes. I independently examined the patient and formulated the important aspects of the plan. I have edited the note to reflect any of my changes or salient points. I have personally discussed the plan with the patient and/or family.  He has improved with milrinone. Volume looks much better. Remains confused though. Denies CP or SOB. Remains in AF. Rates mildly elevated  General:  Elderly weak appearing mildly condused HEENT: normal Neck: supple. no JVD. Carotids 2+ bilat; no bruits. No lymphadenopathy or thryomegaly appreciated. Cor: PMI nondisplaced. Irregular rate & rhythm. No rubs, gallops or murmurs. Lungs: clear Abdomen: obese soft,  nontender, nondistended. No hepatosplenomegaly. No bruits or masses. Good bowel sounds. Extremities: no cyanosis, clubbing, rash, trace edema Neuro: alert & orientedx3, cranial nerves grossly intact. moves all 4 extremities w/o difficulty. Affect pleasant  Hemodynamically improved. I have discussed his case extensively with EP and [name] and I think we all agree that the main issue is increasing weakness and confusion. He is not a candidate for CRT. I do not think LAD stent would benefit him at this point given lack of CP. Will continue to optimize medically. Stop milrinone today. Likely d/c to SNF later this week.   [name], MD  4:16 PM

## 2020-05-13 NOTE — Progress Notes (Addendum)
ANTICOAGULATION CONSULT NOTE - Follow Up Consult  Pharmacy Consult for heparin + warfarin Indication: atrial fibrillation  Allergies  Allergen Reactions  . Ambien [Zolpidem Tartrate] Other (See Comments)    Sleep walks  . Lipitor [Atorvastatin Calcium] Other (See Comments)    myalgias  . Ranitidine Other (See Comments)    Chest discomfort  . Simvastatin Other (See Comments)    Myalgias  . Xanax Xr [Alprazolam Er] Other (See Comments)    Tightness in chest  . Cholestatin Other (See Comments)    UNSPECIFIED REACTION   . Clopidogrel Bisulfate Rash    Patient Measurements: Height: 6\' 1"  (185.4 cm) Weight: 98 kg (216 lb 0.8 oz) IBW/kg (Calculated) : 79.9  Vital Signs: Temp: 97.8 F (36.6 C) (06/30 0347) Temp Source: Oral (06/30 0347) BP: 127/69 (06/30 0347) Pulse Rate: 84 (06/30 0347)  Labs: Recent Labs    05/11/20 0607 05/11/20 1334 05/11/20 1334 05/11/20 1340 05/12/20 0500 05/12/20 1200 05/13/20 0509 05/13/20 0510  HGB  --  14.3   < > 13.9 13.8  --   --   --   HCT  --  42.0  --  41.0 42.3  --   --   --   PLT  --   --   --   --  188  --   --   --   LABPROT 13.8  --   --   --   --   --  13.8  --   INR 1.1  --   --   --   --   --  1.1  --   HEPARINUNFRC  --   --   --   --  0.36 0.32  --  0.27*  CREATININE  --   --   --   --  1.46*  --  1.54*  --    < > = values in this interval not displayed.    Estimated Creatinine Clearance: 43.2 mL/min (A) (by C-G formula based on SCr of 1.54 mg/dL (H)).  Assessment:  75 yoM on warfarin PTA started on heparin after cath. Heparin subtherapeutic, INR low as expected. No interventions planned so will begin warfarin reload.   *Home warfarin dose = 3mg  Thurs/Sat/Sun; 6mg  AODs  Goal of Therapy:  Heparin level 0.3-0.7 units/ml Monitor platelets by anticoagulation protocol: Yes   Plan:  Increase heparin to 1300 units/hr Daily heparin level and CBC Warfarin 7.5mg  PO x1 tonight   Arrie Senate, PharmD, BCPS Clinical  Pharmacist [phone] Please check AMION for all [place] numbers 05/13/2020

## 2020-05-13 NOTE — Evaluation (Addendum)
Occupational Therapy Evaluation Patient Details Name: [name] [name] MRN: [id number] DOB: 17-Feb-1932 Today's Date: 05/13/2020    History of Present Illness Patient is a 86 y/o male who presents with SOB and admitted with acute on chronic CHF. s/p cardiac cath 6/28. PMH includes TAVR, systolic CHF, A-fib, HTN, DM, CVA, PVD, CKD,   Clinical Impression   PTA pt living alone with [facility] aide assistance. At time of eval, pt is able to complete transfers at min A level and functional mobility with min guard and RW to bathroom. Multimodal cues for safety and sequencing needed. Pt very fixated on calling his wife whom he insists is in the hospital (wife has been deceased for some time) and is constantly looking for his phone to call her. Pt is easily redirected, but needs continued cues to sustain redirection. Monitor read VTach multiple times throughout session- RN made aware. RN states she believes it may have been a false reading, tele monitoring did not call. Given current status recommend pt d/c to SNF for continued safety and progression of BADL prior to d/c home. Will continue to follow per POC listed below.     Follow Up Recommendations  SNF;Supervision/Assistance - 24 hour (unless pt can get 24/7 at home)    Equipment Recommendations  3 in 1 bedside commode    Recommendations for Other Services       Precautions / Restrictions Precautions Precautions: Fall Precaution Comments: watch HR Restrictions Weight Bearing Restrictions: No      Mobility Bed Mobility               General bed mobility comments: up in chair  Transfers Overall transfer level: Needs assistance Equipment used: Rolling walker (2 wheeled) Transfers: Sit to/from Stand Sit to Stand: Min assist         General transfer comment: min A to rise and steady with RW. Cues for safety    Balance Overall balance assessment: Needs assistance Sitting-balance support: Feet supported;No upper extremity  supported Sitting balance-Leahy Scale: Good     Standing balance support: During functional activity Standing balance-Leahy Scale: Poor Standing balance comment: Requires UE support in standing.                           ADL either performed or assessed with clinical judgement   ADL Overall ADL's : Needs assistance/impaired Eating/Feeding: Set up;Sitting   Grooming: Min guard;Standing Grooming Details (indicate cue type and reason): standing at sink with cues for safety and sequencing Upper Body Bathing: Minimal assistance;Sitting   Lower Body Bathing: Minimal assistance;Sit to/from stand;Sitting/lateral leans   Upper Body Dressing : Minimal assistance;Sitting   Lower Body Dressing: Minimal assistance;Sit to/from stand;Sitting/lateral leans Lower Body Dressing Details (indicate cue type and reason): min A to manage briefs Toilet Transfer: Min guard;Cueing for safety;Cueing for sequencing;RW Toilet Transfer Details (indicate cue type and reason): NT assisted with lines. Pt able to complete functional mobility into bathroom for toilet transfer [place] and Hygiene: Minimal assistance;Sit to/from stand Toileting - Clothing Manipulation Details (indicate cue type and reason): to manage posterior peri care. Mostly due to cognition and awareness     Functional mobility during ADLs: Min guard;Cueing for safety;Cueing for sequencing;Rolling walker       Vision Patient Visual Report: No change from baseline       Perception     Praxis      Pertinent Vitals/Pain Pain Assessment: No/denies pain     Hand  Dominance     Extremity/Trunk Assessment Upper Extremity Assessment Upper Extremity Assessment: Generalized weakness   Lower Extremity Assessment Lower Extremity Assessment: Generalized weakness       Communication Communication Communication: HOH   Cognition Arousal/Alertness: Awake/alert Behavior During Therapy: WFL for tasks  assessed/performed Overall Cognitive Status: No family/caregiver present to determine baseline cognitive functioning Area of Impairment: Orientation;Memory;Following commands;Safety/judgement;Awareness;Problem solving                 Orientation Level: Disoriented to;Place;Time;Situation   Memory: Decreased short-term memory Following Commands: Follows one step commands with increased time Safety/Judgement: Decreased awareness of deficits;Decreased awareness of safety Awareness: Emergent Problem Solving: Slow processing;Decreased initiation;Difficulty sequencing;Requires verbal cues;Requires tactile cues General Comments: Pt is disoriented at this time, asking to call wife (whom has been deceased for some time). Needs consistent cues to redirect to task. increased time, cues, processing needed for basic mobility   General Comments  monitor reading Tanna Furry, RN informed and states it's likely arifact    Exercises     Shoulder Instructions      Home Living Family/patient expects to be discharged to:: Private residence Living Arrangements: Alone Available Help at Discharge: Personal care attendant;Available 24 hours/day;Family (has CNA most every hour except for shift change) Type of Home: House Home Access: Stairs to enter [employer] of Steps: 4-5 Entrance Stairs-Rails: Right Home Layout: One level;Laundry or work area in [place] Shower/Tub: Teacher, early years/pre: [place]: Environmental consultant - 2 wheels;Wheelchair - manual;Cane - single point          Prior Functioning/Environment Level of Independence: Needs assistance  Gait / Transfers Assistance Needed: uses RW vs SPC for ambulation. ADL's / Homemaking Assistance Needed: Aides assist with IADLs and getting in/out of bath.   Comments: Has a niece that helps as well with grocery shopping and other errands.        OT Problem List: Decreased strength;Decreased knowledge  of use of DME or AE;Decreased activity tolerance;Decreased cognition;Cardiopulmonary status limiting activity;Impaired balance (sitting and/or standing)      OT Treatment/Interventions: Self-care/ADL training;Therapeutic exercise;Patient/family education;Balance training;Energy conservation;Therapeutic activities;DME and/or AE instruction;Cognitive remediation/compensation    OT Goals(Current goals can be found in the care plan section) Acute Rehab OT Goals Patient Stated Goal: to go home OT Goal Formulation: With patient Time For Goal Achievement: 05/27/20 Potential to Achieve Goals: Good  OT Frequency: Min 2X/week   Barriers to D/C:            Co-evaluation              AM-PAC OT "6 Clicks" Daily Activity     Outcome Measure Help from another person eating meals?: None Help from another person taking care of personal grooming?: A Little Help from another person toileting, which includes using toliet, bedpan, or urinal?: A Little Help from another person bathing (including washing, rinsing, drying)?: A Little Help from another person to put on and taking off regular upper body clothing?: A Little Help from another person to put on and taking off regular lower body clothing?: A Little 6 Click Score: 19   End of Session Equipment Utilized During Treatment: Gait belt;Rolling walker Nurse Communication: Mobility status  Activity Tolerance: Patient tolerated treatment well Patient left: in chair;with call bell/phone within reach;with chair alarm set  OT Visit Diagnosis: Other abnormalities of gait and mobility (R26.89);Muscle weakness (generalized) (M62.81);Other symptoms and signs involving cognitive function  Time: 3532-9924 OT Time Calculation (min): 27 min Charges:  OT General Charges $OT Visit: 1 Visit OT Evaluation $OT Eval Moderate Complexity: 1 Mod OT Treatments $Self Care/Home Management : 8-22 mins  ,Zenovia Jarred, MSOT, OTR/L Acute  Rehabilitation Services [facility] Office Number: [phone] Pager: [phone]  Zenovia Jarred 05/13/2020, 1:27 PM

## 2020-05-14 ENCOUNTER — Inpatient Hospital Stay
Admission: RE | Admit: 2020-05-14 | Discharge: 2020-05-30 | Disposition: A | Discharge status: Home / Self Care | Payer: PPO | Source: Ambulatory Visit | Attending: Internal Medicine | Admitting: Internal Medicine

## 2020-05-14 DIAGNOSIS — I4811 Longstanding persistent atrial fibrillation: Secondary | ICD-10-CM | POA: Diagnosis not present

## 2020-05-14 DIAGNOSIS — I739 Peripheral vascular disease, unspecified: Secondary | ICD-10-CM | POA: Diagnosis not present

## 2020-05-14 DIAGNOSIS — Z7401 Bed confinement status: Secondary | ICD-10-CM | POA: Diagnosis not present

## 2020-05-14 DIAGNOSIS — M6281 Muscle weakness (generalized): Secondary | ICD-10-CM | POA: Diagnosis not present

## 2020-05-14 DIAGNOSIS — E785 Hyperlipidemia, unspecified: Secondary | ICD-10-CM | POA: Diagnosis not present

## 2020-05-14 DIAGNOSIS — I5023 Acute on chronic systolic (congestive) heart failure: Secondary | ICD-10-CM | POA: Diagnosis not present

## 2020-05-14 DIAGNOSIS — N183 Chronic kidney disease, stage 3 unspecified: Secondary | ICD-10-CM | POA: Diagnosis not present

## 2020-05-14 DIAGNOSIS — Z741 Need for assistance with personal care: Secondary | ICD-10-CM | POA: Diagnosis not present

## 2020-05-14 DIAGNOSIS — R279 Unspecified lack of coordination: Secondary | ICD-10-CM | POA: Diagnosis not present

## 2020-05-14 DIAGNOSIS — E119 Type 2 diabetes mellitus without complications: Secondary | ICD-10-CM | POA: Diagnosis not present

## 2020-05-14 DIAGNOSIS — M2550 Pain in unspecified joint: Secondary | ICD-10-CM | POA: Diagnosis not present

## 2020-05-14 DIAGNOSIS — E1122 Type 2 diabetes mellitus with diabetic chronic kidney disease: Secondary | ICD-10-CM | POA: Diagnosis not present

## 2020-05-14 DIAGNOSIS — I10 Essential (primary) hypertension: Secondary | ICD-10-CM | POA: Diagnosis not present

## 2020-05-14 DIAGNOSIS — R41841 Cognitive communication deficit: Secondary | ICD-10-CM | POA: Diagnosis not present

## 2020-05-14 DIAGNOSIS — E1169 Type 2 diabetes mellitus with other specified complication: Secondary | ICD-10-CM | POA: Diagnosis not present

## 2020-05-14 DIAGNOSIS — Z794 Long term (current) use of insulin: Secondary | ICD-10-CM | POA: Diagnosis not present

## 2020-05-14 DIAGNOSIS — M159 Polyosteoarthritis, unspecified: Secondary | ICD-10-CM | POA: Diagnosis not present

## 2020-05-14 DIAGNOSIS — I130 Hypertensive heart and chronic kidney disease with heart failure and stage 1 through stage 4 chronic kidney disease, or unspecified chronic kidney disease: Secondary | ICD-10-CM | POA: Diagnosis not present

## 2020-05-14 DIAGNOSIS — E876 Hypokalemia: Secondary | ICD-10-CM | POA: Diagnosis not present

## 2020-05-14 DIAGNOSIS — I4891 Unspecified atrial fibrillation: Secondary | ICD-10-CM | POA: Diagnosis not present

## 2020-05-14 DIAGNOSIS — N1832 Chronic kidney disease, stage 3b: Secondary | ICD-10-CM | POA: Diagnosis not present

## 2020-05-14 DIAGNOSIS — Z7901 Long term (current) use of anticoagulants: Secondary | ICD-10-CM | POA: Diagnosis not present

## 2020-05-14 DIAGNOSIS — J4520 Mild intermittent asthma, uncomplicated: Secondary | ICD-10-CM | POA: Diagnosis not present

## 2020-05-14 DIAGNOSIS — R262 Difficulty in walking, not elsewhere classified: Secondary | ICD-10-CM | POA: Diagnosis not present

## 2020-05-14 DIAGNOSIS — I340 Nonrheumatic mitral (valve) insufficiency: Secondary | ICD-10-CM | POA: Diagnosis not present

## 2020-05-14 DIAGNOSIS — E1121 Type 2 diabetes mellitus with diabetic nephropathy: Secondary | ICD-10-CM | POA: Diagnosis not present

## 2020-05-14 DIAGNOSIS — F5104 Psychophysiologic insomnia: Secondary | ICD-10-CM | POA: Diagnosis not present

## 2020-05-14 DIAGNOSIS — I5022 Chronic systolic (congestive) heart failure: Secondary | ICD-10-CM | POA: Diagnosis not present

## 2020-05-14 DIAGNOSIS — N179 Acute kidney failure, unspecified: Secondary | ICD-10-CM | POA: Diagnosis not present

## 2020-05-14 DIAGNOSIS — Z952 Presence of prosthetic heart valve: Secondary | ICD-10-CM | POA: Diagnosis not present

## 2020-05-14 DIAGNOSIS — I2510 Atherosclerotic heart disease of native coronary artery without angina pectoris: Secondary | ICD-10-CM | POA: Diagnosis not present

## 2020-05-14 DIAGNOSIS — E1142 Type 2 diabetes mellitus with diabetic polyneuropathy: Secondary | ICD-10-CM | POA: Diagnosis not present

## 2020-05-14 DIAGNOSIS — F0390 Unspecified dementia without behavioral disturbance: Secondary | ICD-10-CM | POA: Diagnosis not present

## 2020-05-14 DIAGNOSIS — N401 Enlarged prostate with lower urinary tract symptoms: Secondary | ICD-10-CM | POA: Diagnosis not present

## 2020-05-14 DIAGNOSIS — R5381 Other malaise: Secondary | ICD-10-CM | POA: Diagnosis not present

## 2020-05-14 DIAGNOSIS — K219 Gastro-esophageal reflux disease without esophagitis: Secondary | ICD-10-CM | POA: Diagnosis not present

## 2020-05-14 LAB — BASIC METABOLIC PANEL
Anion gap: 10 (ref 5–15)
BUN: 29 mg/dL — ABNORMAL HIGH (ref 8–23)
CO2: 28 mmol/L (ref 22–32)
Calcium: 9.6 mg/dL (ref 8.9–10.3)
Chloride: 97 mmol/L — ABNORMAL LOW (ref 98–111)
Creatinine, Ser: 1.67 mg/dL — ABNORMAL HIGH (ref 0.61–1.24)
GFR calc Af Amer: 43 mL/min — ABNORMAL LOW (ref >60)
GFR calc non Af Amer: 37 mL/min — ABNORMAL LOW (ref >60)
Glucose, Bld: 192 mg/dL — ABNORMAL HIGH (ref 70–99)
Potassium: 4.2 mmol/L (ref 3.5–5.1)
Sodium: 135 mmol/L (ref 135–145)

## 2020-05-14 LAB — GLUCOSE, CAPILLARY
Glucose-Capillary: 148 mg/dL — ABNORMAL HIGH (ref 70–99)
Glucose-Capillary: 278 mg/dL — ABNORMAL HIGH (ref 70–99)
Glucose-Capillary: 192 mg/dL — ABNORMAL HIGH (ref 70–99)

## 2020-05-14 LAB — PROTIME-INR
INR: 1.1 (ref 0.8–1.2)
Prothrombin Time: 14.1 seconds (ref 11.4–15.2)

## 2020-05-14 LAB — CBC
HCT: 43.3 % (ref 39.0–52.0)
Hemoglobin: 14.4 g/dL (ref 13.0–17.0)
MCH: 29.4 pg (ref 26.0–34.0)
MCHC: 33.3 g/dL (ref 30.0–36.0)
MCV: 88.5 fL (ref 80.0–100.0)
Platelets: 205 10*3/uL (ref 150–400)
RBC: 4.89 MIL/uL (ref 4.22–5.81)
RDW: 14.6 % (ref 11.5–15.5)
WBC: 8.9 10*3/uL (ref 4.0–10.5)
nRBC: 0 % (ref 0.0–0.2)

## 2020-05-14 LAB — COOXEMETRY PANEL
Carboxyhemoglobin: 1 % (ref 0.5–1.5)
Methemoglobin: 0.8 % (ref 0.0–1.5)
O2 Saturation: 55.8 %
Total hemoglobin: 14.5 g/dL (ref 12.0–16.0)

## 2020-05-14 LAB — HEPARIN LEVEL (UNFRACTIONATED): Heparin Unfractionated: 0.48 IU/mL (ref 0.30–0.70)

## 2020-05-14 LAB — SARS CORONAVIRUS 2 BY RT PCR (HOSPITAL ORDER, PERFORMED IN ~~LOC~~ HOSPITAL LAB): SARS Coronavirus 2: NEGATIVE

## 2020-05-14 MED ORDER — WARFARIN SODIUM 6 MG PO TABS: 3.0000 mg | ORAL_TABLET | ORAL | 11 refills | Status: DC

## 2020-05-14 MED ORDER — FUROSEMIDE 40 MG PO TABS
40.0000 mg | ORAL_TABLET | Freq: Every day | ORAL | 11 refills | Status: DC
Start: 2020-05-14 — End: 2020-05-25

## 2020-05-14 MED ORDER — SPIRONOLACTONE 25 MG PO TABS: 12.5000 mg | ORAL_TABLET | Freq: Every day | ORAL | 5 refills | Status: DC

## 2020-05-14 MED ORDER — DIGOXIN 125 MCG PO TABS: 0.1250 mg | ORAL_TABLET | Freq: Every day | ORAL | Status: DC

## 2020-05-14 MED ORDER — ASPIRIN 81 MG PO TBEC: 81.0000 mg | DELAYED_RELEASE_TABLET | Freq: Every day | ORAL | 11 refills | Status: DC

## 2020-05-14 MED ORDER — DIGOXIN 125 MCG PO TABS: 0.1250 mg | ORAL_TABLET | Freq: Every day | ORAL | 5 refills | Status: DC

## 2020-05-14 MED ORDER — FUROSEMIDE 20 MG PO TABS: 20.0000 mg | ORAL_TABLET | Freq: Every day | ORAL | 5 refills | Status: DC

## 2020-05-14 MED ORDER — WARFARIN SODIUM 7.5 MG PO TABS
7.5000 mg | ORAL_TABLET | Freq: Once | ORAL | Status: AC
  Administered 2020-05-14: 7.5 mg via ORAL
  Filled 2020-05-14: qty 1

## 2020-05-14 MED ORDER — BISOPROLOL FUMARATE 5 MG PO TABS: 2.5000 mg | ORAL_TABLET | Freq: Every day | ORAL | 5 refills | Status: DC

## 2020-05-14 NOTE — Discharge Instructions (Signed)
Radial Site Care  This sheet gives you information about how to care for yourself after your procedure. Your health care provider may also give you more specific instructions. If you have problems or questions, contact your health care provider. What can I expect after the procedure? After the procedure, it is common to have:  Bruising and tenderness at the catheter insertion area. Follow these instructions at home: Medicines  Take over-the-counter and prescription medicines only as told by your health care provider. Insertion site care  Follow instructions from your health care provider about how to take care of your insertion site. Make sure you: ? Wash your hands with soap and water before you change your bandage (dressing). If soap and water are not available, use hand sanitizer. ? Change your dressing as told by your health care provider. ? Leave stitches (sutures), skin glue, or adhesive strips in place. These skin closures may need to stay in place for 2 weeks or longer. If adhesive strip edges start to loosen and curl up, you may trim the loose edges. Do not remove adhesive strips completely unless your health care provider tells you to do that.  Check your insertion site every day for signs of infection. Check for: ? Redness, swelling, or pain. ? Fluid or blood. ? Pus or a bad smell. ? Warmth.  Do not take baths, swim, or use a hot tub until your health care provider approves.  You may shower 24-48 hours after the procedure, or as directed by your health care provider. ? Remove the dressing and gently wash the site with plain soap and water. ? Pat the area dry with a clean towel. ? Do not rub the site. That could cause bleeding.  Do not apply powder or lotion to the site. Activity   For 24 hours after the procedure, or as directed by your health care provider: ? Do not flex or bend the affected arm. ? Do not push or pull heavy objects with the affected arm. ? Do not  drive yourself home from the hospital or clinic. You may drive 24 hours after the procedure unless your health care provider tells you not to. ? Do not operate machinery or power tools.  Do not lift anything that is heavier than 10 lb (4.5 kg), or the limit that you are told, until your health care provider says that it is safe.  Ask your health care provider when it is okay to: ? Return to work or school. ? Resume usual physical activities or sports. ? Resume sexual activity. General instructions  If the catheter site starts to bleed, raise your arm and put firm pressure on the site. If the bleeding does not stop, get help right away. This is a medical emergency.  If you went home on the same day as your procedure, a responsible adult should be with you for the first 24 hours after you arrive home.  Keep all follow-up visits as told by your health care provider. This is important. Contact a health care provider if:  You have a fever.  You have redness, swelling, or yellow drainage around your insertion site. Get help right away if:  You have unusual pain at the radial site.  The catheter insertion area swells very fast.  The insertion area is bleeding, and the bleeding does not stop when you hold steady pressure on the area.  Your arm or hand becomes pale, cool, tingly, or numb. These symptoms may represent a serious problem   that is an emergency. Do not wait to see if the symptoms will go away. Get medical help right away. Call your local emergency services (911 in the [place] Do not drive yourself to the hospital. Summary  After the procedure, it is common to have bruising and tenderness at the site.  Follow instructions from your health care provider about how to take care of your radial site wound. Check the wound every day for signs of infection.  Do not lift anything that is heavier than 10 lb (4.5 kg), or the limit that you are told, until your health care provider says  that it is safe. This information is not intended to replace advice given to you by your health care provider. Make sure you discuss any questions you have with your health care provider. Document Revised: 12/06/2017 Document Reviewed: 12/06/2017 Elsevier Patient Education  2020 Elsevier Inc.  

## 2020-05-14 NOTE — Progress Notes (Signed)
ANTICOAGULATION CONSULT NOTE - Follow Up Consult  Pharmacy Consult for heparin + warfarin Indication: atrial fibrillation  Allergies  Allergen Reactions  . Ambien [Zolpidem Tartrate] Other (See Comments)    Sleep walks  . Lipitor [Atorvastatin Calcium] Other (See Comments)    myalgias  . Ranitidine Other (See Comments)    Chest discomfort  . Simvastatin Other (See Comments)    Myalgias  . Xanax Xr [Alprazolam Er] Other (See Comments)    Tightness in chest  . Cholestatin Other (See Comments)    UNSPECIFIED REACTION   . Clopidogrel Bisulfate Rash    Patient Measurements: Height: 6\' 1"  (185.4 cm) Weight: 98.7 kg (217 lb 9.6 oz) IBW/kg (Calculated) : 79.9  Vital Signs: Temp: 98.2 F (36.8 C) (07/01 1120) Temp Source: Oral (07/01 1120) BP: 121/87 (07/01 1120) Pulse Rate: 75 (07/01 1120)  Labs: Recent Labs    05/12/20 0500 05/12/20 0500 05/12/20 1200 05/13/20 0509 05/13/20 0510 05/14/20 0500  HGB 13.8  --   --   --   --  14.4  HCT 42.3  --   --   --   --  43.3  PLT 188  --   --   --   --  205  LABPROT  --   --   --  13.8  --  14.1  INR  --   --   --  1.1  --  1.1  HEPARINUNFRC 0.36   < > 0.32  --  0.27* 0.48  CREATININE 1.46*  --   --  1.54*  --  1.67*   < > = values in this interval not displayed.    Estimated Creatinine Clearance: 40 mL/min (A) (by C-G formula based on SCr of 1.67 mg/dL (H)).  Assessment:  58 yoM on warfarin PTA started on heparin after cath. Heparin at goal, INR continues to be low as expected. No interventions planned so warfarin reload started yesterday. Hgb wnl at 14, no bleeding issues noted.   *Home warfarin dose = 3mg  Thurs/Sat/Sun; 6mg  AODs  Goal of Therapy:  Heparin level 0.3-0.7 units/ml Monitor platelets by anticoagulation protocol: Yes   Plan:  Continue heparin at 1300 units/hr Daily heparin level and CBC Warfarin 7.5mg  PO x1 tonight  Erin Hearing PharmD., BCPS Clinical Pharmacist 05/14/2020 2:12 PM

## 2020-05-14 NOTE — Progress Notes (Signed)
PTAR transporting patient via stretcher to [facility].  Patient alert to person and place.  Paperwork filled out and AVS printed per reporting 1st shift RN.

## 2020-05-14 NOTE — Discharge Summary (Addendum)
Advanced Heart Failure Team  Discharge Summary   Patient ID: [name] [name] MRN: [id number], DOB/AGE: 01-04-1932 87 y.o. Admit date: 05/11/2020 D/C date:     05/14/2020   Primary Discharge Diagnoses:  Acute on Chronic Systolic Heart Failure, NYHA Class IIIb, w/ Low Output  Multivessel CAD AKI on CKD, Stage IIIb  Moderate Mitral Regurgitation (Functional) H/o Aortic Stenosis, s/p TAVR (2017) Chronic Atrial Fibrillation  Chronic Anticoagulation Therapy (Coumadin) Dementia Debility  Type 2DM Hypertension Hyperlipidemia   Hospital Course:   86 y/o male, widower (wife died 2 years ago), with DM2, HTN, CKD, CHF with prior EF 40%, CAD, AS s/p TAVR in 2017 and dementia, who was referred for diagnostic [facility] for worsening dyspnea.   R/LHC on 05/11/20 demonstrated elevated filling pressures c/w volume overload, low output HF and significant 2v CAD. CI 1.6 by Fick calculation. 1.3 by Thermodilution. PCWP 27 mmHg. Angiography showed  80-90% proximal LAD stenosis involving ostium of D1 (70% D1 stenosis), and sequential 50% ostial and proximal LCx lesions followed by 80% mid/distal LCx stenosis (similar in appearance to 2017). Moderate, diffuse RCA disease similar to 2017.  He was directly admitted from the cath lab and started on milrinone,  IV Lasix and IV heparin. Echo was repeated and demonstrated LVEF 25-40% (lower than prior study), RV ok, moderate MR, prosthetic AV ok. He diuresed well and hemodynamics improved on milrinone. Transitioned off milrinone and placed back on PO diuretics. EP evaluated for potential CRT pacing but he was deemed not a candidate given QRS duration, advanced age and AFib. Case also discussed with interventionalist and it was decided not to intervene on the LAD given lack of CP. Will continue to optimize medically. He was treated w/ ASA, statin,  blocker as well as spiro and digoxin for HF. BP too soft for ARNI/ARB. Coumadin resumed for AF.   PT/OT evaluated and recommended  SNF at discharge.   On 7/1, he was seen and examined by Dr. Haroldine Laws and felt stable for d/c to SNF.     Discharge Weight Range: 217 lb  SCr day of d/c 1.6 (baseline 1.4) Discharge Vitals: Blood pressure 105/72, pulse 86, temperature 98.4 F (36.9 C), temperature source Oral, resp. rate 12, height 6' 1" (1.854 m), weight 98.7 kg, SpO2 97 %.  Labs: Lab Results  Component Value Date   WBC 8.9 05/14/2020   HGB 14.4 05/14/2020   HCT 43.3 05/14/2020   MCV 88.5 05/14/2020   PLT 205 05/14/2020    Recent Labs  Lab 05/14/20 0500  NA 135  K 4.2  CL 97*  CO2 28  BUN 29*  CREATININE 1.67*  CALCIUM 9.6  GLUCOSE 192*   Lab Results  Component Value Date   CHOL 137 01/24/2020   HDL 37 (L) 01/24/2020   LDLCALC 74 01/24/2020   TRIG 147 01/24/2020   BNP (last 3 results) Recent Labs    05/11/20 2004  BNP 154.4*    ProBNP (last 3 results) Recent Labs    04/01/20 1459  PROBNP 985*     Diagnostic Studies/Procedures   R/LHC 05/11/20 Conclusions: 1. Severe two-vessel coronary artery disease, including 80-90% proximal LAD stenosis involving ostium of D1 (70% D1 stenosis), and sequential 50% ostial and proximal LCx lesions followed by 80% mid/distal LCx stenosis (similar in appearance to 2017). 2. Moderate, diffuse RCA disease similar to 2017. 3. Moderately elevated left heart, right heart, and pulmonary artery pressures. 4. Prominent V-waves on PCWP tracing; query more severe mitral regurgitation than reported on  echocardiogram in 02/2020. 5. Severely reduced cardiac output/index.  Right Heart Pressures RA (mean): 13 mmHg RV (S/EDP): 60/12 mmHg PA (S/D, mean): 60/30 (40) mmHg PCWP (mean): 27 mmHg with prominent V-waves  Ao sat: 98% PA sat: 55%  Fick CO: 3.4 L/min Fick CI: 1.6 L/min/m^2  Thermodilution CO: 2.8 L/min Thermodilution CI: 1.3 L/min/m^2    2D Echo 04/2020 Left ventricular ejection fraction, by estimation, is 35 to 40%. The left ventricle has moderately  decreased function. The left ventricle demonstrates global hypokinesis. Left ventricular diastolic function could not be evaluated. 2. Right ventricular systolic function is normal. The right ventricular size is normal. There is mildly elevated pulmonary artery systolic pressure. 3. Left atrial size was severely dilated. 4. Right atrial size was moderately dilated. 5. The mitral valve is normal in structure. Moderate mitral valve regurgitation. 6. Tricuspid valve regurgitation is moderate. 7. The aortic valve has been repaired/replaced. Aortic valve regurgitation is not visualized. There is a 29 mm Edwards Sapien prosthetic (TAVR) valve present in the aortic position. Echo findings are consistent with normal structure and function of the aortic valve prosthesis. 8. The inferior vena cava is normal in size with greater than 50% respiratory variability, suggesting right atrial pressure of 3 mmHg.  Discharge Medications   Allergies as of 05/14/2020      Reactions   Ambien [zolpidem Tartrate] Other (See Comments)   Sleep walks   Lipitor [atorvastatin Calcium] Other (See Comments)   myalgias   Ranitidine Other (See Comments)   Chest discomfort   Simvastatin Other (See Comments)   Myalgias   Xanax Xr [alprazolam Er] Other (See Comments)   Tightness in chest   Cholestatin Other (See Comments)   UNSPECIFIED REACTION    Clopidogrel Bisulfate Rash      Medication List    STOP taking these medications   acetaminophen 500 MG tablet Commonly known as: TYLENOL   enoxaparin 150 MG/ML injection Commonly known as: Lovenox     TAKE these medications   albuterol (2.5 MG/3ML) 0.083% nebulizer solution Commonly known as: PROVENTIL Take 3 mLs (2.5 mg total) by nebulization every 6 (six) hours as needed for wheezing or shortness of breath.   aspirin 81 MG EC tablet Take 1 tablet (81 mg total) by mouth daily. Swallow whole. Start taking on: May 15, 2020   bisoprolol 5 MG tablet Commonly  known as: ZEBETA Take 0.5 tablets (2.5 mg total) by mouth daily. Start taking on: May 15, 2020 What changed: See the new instructions.   blood glucose meter kit and supplies Dispense based on patient and insurance preference. Use to test glucose three times daily.  (FOR ICD-10)   cholecalciferol 25 MCG (1000 UNIT) tablet Commonly known as: VITAMIN D3 Take 1,000 Units by mouth in the morning and at bedtime.   digoxin 0.125 MG tablet Commonly known as: LANOXIN Take 1 tablet (0.125 mg total) by mouth daily. Start taking on: May 15, 2020   furosemide 40 MG tablet Commonly known as: Lasix Take 1 tablet (40 mg total) by mouth daily. What changed:   medication strength  See the new instructions.   Lantus SoloStar 100 UNIT/ML Solostar Pen Generic drug: insulin glargine INJECT 32 UNITS EVERY EVENING. What changed:   how much to take  how to take this  when to take this   Melatonin 10 MG Tabs Take 10 mg by mouth at bedtime.   metFORMIN 500 MG 24 hr tablet Commonly known as: GLUCOPHAGE-XR TAKE 1 TABLET BY MOUTH ONCE DAILY  WITH BREAKFAST. What changed: See the new instructions.   nortriptyline 10 MG capsule Commonly known as: PAMELOR TAKE 1 OR 2 CAPSULES AT BEDTIME FOR BURNING IN FEET.[28 IN PACKS/ NONE IN BOTTLE] What changed: See the new instructions.   potassium chloride SA 20 MEQ tablet Commonly known as: KLOR-CON TAKE 1 TABLET BY MOUTH ON MONDAY, WEDNESDAY AND FRIDAY. What changed: See the new instructions.   psyllium 58.6 % powder Commonly known as: METAMUCIL Take 1 packet by mouth daily.   rosuvastatin 5 MG tablet Commonly known as: CRESTOR TAKE (1) TABLET BY MOUTH ONCE DAILY AT BEDTIME. What changed: See the new instructions.   spironolactone 25 MG tablet Commonly known as: ALDACTONE Take 0.5 tablets (12.5 mg total) by mouth at bedtime.   TRUEplus Pen Needles 31G X 8 MM Misc Generic drug: Insulin Pen Needle USE WITH LANTUS SOLOSTAR EVERY EVENING    warfarin 6 MG tablet Commonly known as: COUMADIN Take as directed. If you are unsure how to take this medication, talk to your nurse or doctor. Original instructions: Take 0.5-1 tablets (3-6 mg total) by mouth See admin instructions. 3 mg Sun Thurs Sat, 6 mg all other days What changed: See the new instructions.       Disposition   The patient will be discharged in stable condition to SNF   Contact information for follow-up providers    [facility] Follow up on 06/11/2020.   Specialty: Cardiology Why: 11:30 am [facility] [id number]  Contact information: [address] [phone] [place] [phone]           Contact information for after-discharge care    [facility] .   Service: Skilled Nursing Contact information: [address] [place] [phone]                    Duration of Discharge Encounter: Greater than 35 minutes   Signed, Lyda Jester PA-C   Patient seen and examined with the above-signed Advanced Practice Provider and/or Housestaff. I personally reviewed laboratory data, imaging studies and relevant notes. I independently examined the patient and formulated the important aspects of the plan. I have edited the note to reflect any of my changes or salient points. I have personally discussed the plan with the patient and/or family.   He is stable today. Denies CP or SOB. AF rate better controlled.  Co-ox ok off milrinone. Creatinine up just slightly.   General: Sitting up in bed No resp difficulty HEENT: normal Neck: supple. no JVD. Carotids 2+ bilat; no bruits. No lymphadenopathy or thryomegaly appreciated. Cor: PMI nondisplaced. Irregular rate & rhythm. No rubs, gallops or murmurs. Lungs: clear Abdomen: soft, nontender, nondistended. No hepatosplenomegaly. No bruits or  masses. Good bowel sounds. Extremities: no cyanosis, clubbing, rash, trace edema Neuro: alert & oriented x 2  cranial nerves grossly intact. moves all 4 extremities w/o difficulty. Affect pleasant  Case reviewed extensively with EP and Interventional team (inclunding Dr. Copper - his primary cardiologist). Not candidate for CRT or LAD stent at this point as likely not to benefit. Stable for d/c to [facility] today. Would increase lasix to 40 mg daily. Consider change to torsemide as needed.   [name], MD  5:45 PM

## 2020-05-14 NOTE — Progress Notes (Signed)
Report called to [facility], spoke with Johnney Ou.  Patient AVS printed and reviewed with patient.  Waiting for PTAR for d/c.

## 2020-05-14 NOTE — Progress Notes (Addendum)
Advanced Heart Failure Rounding Note  PCP-Cardiologist: Sherren Mocha, MD   Subjective:    Improved w/ milrinone and has diuresed well. CVP 7. Now off milrinone. Co-ox 56%.   No cardiac complaints. No dyspnea or CP. Remains mildly confused at times.   In chronic afib that is rate controlled.   Objective:   Weight Range: 98.7 kg Body mass index is 28.71 kg/m.   Vital Signs:   Temp:  [97.5 F (36.4 C)-98.5 F (36.9 C)] 97.9 F (36.6 C) (07/01 0814) Pulse Rate:  [69-83] 83 (07/01 0814) Resp:  [18-25] 24 (07/01 0814) BP: (116-156)/(52-87) 116/73 (07/01 0814) SpO2:  [94 %-100 %] 94 % (07/01 0814) Weight:  [98.7 kg] 98.7 kg (07/01 0700) Last BM Date: 05/10/20  Weight change: Filed Weights   05/12/20 0500 05/13/20 0347 05/14/20 0700  Weight: 99.3 kg 98 kg 98.7 kg    Intake/Output:   Intake/Output Summary (Last 24 hours) at 05/14/2020 1018 Last data filed at 05/14/2020 0800 Gross per 24 hour  Intake 1264.35 ml  Output 1820 ml  Net -555.65 ml      Physical Exam  CVP 7 General:  Pleasantly confused elderly WM, laying in bed. No resp difficulty HEENT: normal Neck: supple. JVD ~7 cm. Carotids 2+ bilat; no bruits. No lymphadenopathy or thryomegaly appreciated. Cor: PMI nondisplaced. Irregular rate & rhythm. 2/6 MR murmur at apex Lungs: clear Abdomen: soft, nontender, nondistended. No hepatosplenomegaly. No bruits or masses. Good bowel sounds. Extremities: no cyanosis, clubbing, rash, no edema, + RUE  PICC  Neuro: alert & orientedx2 , cranial nerves grossly intact. moves all 4 extremities w/o difficulty. Affect pleasant   Telemetry  A fib 80s- 90s, occasional PVCs    EKG    No new EKG to review   Labs    CBC Recent Labs    05/12/20 0500 05/14/20 0500  WBC 7.4 8.9  HGB 13.8 14.4  HCT 42.3 43.3  MCV 89.6 88.5  PLT 188 794   Basic Metabolic Panel Recent Labs    05/13/20 0509 05/14/20 0500  NA 134* 135  K 3.1* 4.2  CL 92* 97*  CO2 28 28    GLUCOSE 187* 192*  BUN 25* 29*  CREATININE 1.54* 1.67*  CALCIUM 9.7 9.6   Liver Function Tests No results for input(s): AST, ALT, ALKPHOS, BILITOT, PROT, ALBUMIN in the last 72 hours. No results for input(s): LIPASE, AMYLASE in the last 72 hours. Cardiac Enzymes No results for input(s): CKTOTAL, CKMB, CKMBINDEX, TROPONINI in the last 72 hours.  BNP: BNP (last 3 results) Recent Labs    05/11/20 2004  BNP 154.4*    ProBNP (last 3 results) Recent Labs    04/01/20 1459  PROBNP 985*     D-Dimer No results for input(s): DDIMER in the last 72 hours. Hemoglobin A1C Recent Labs    05/11/20 2004  HGBA1C 7.8*   Fasting Lipid Panel No results for input(s): CHOL, HDL, LDLCALC, TRIG, CHOLHDL, LDLDIRECT in the last 72 hours. Thyroid Function Tests No results for input(s): TSH, T4TOTAL, T3FREE, THYROIDAB in the last 72 hours.  Invalid input(s): FREET3  Other results:   Imaging    No results found.   Medications:     Scheduled Medications: . albuterol  2.5 mg Nebulization Once  . aspirin EC  81 mg Oral Daily  . bisoprolol  2.5 mg Oral Daily  . Chlorhexidine Gluconate Cloth  6 each Topical Daily  . furosemide  20 mg Oral Daily  . insulin  aspart  0-9 Units Subcutaneous TID WC  . insulin glargine  32 Units Subcutaneous QPM  . melatonin  9 mg Oral QHS  . nortriptyline  10 mg Oral QHS  . psyllium  1 packet Oral Daily  . rosuvastatin  5 mg Oral Daily  . sodium chloride flush  10-40 mL Intracatheter Q12H  . sodium chloride flush  3 mL Intravenous Q12H  . sodium chloride flush  3 mL Intravenous Q12H  . spironolactone  12.5 mg Oral QHS  . Warfarin - Pharmacist Dosing Inpatient   Does not apply q1600    Infusions: . sodium chloride    . sodium chloride    . heparin 1,300 Units/hr (05/14/20 0901)    PRN Medications: sodium chloride, sodium chloride, acetaminophen, albuterol, levalbuterol, ondansetron (ZOFRAN) IV, sodium chloride flush, sodium chloride flush,  sodium chloride flush   Assessment/Plan   1. Acute on Chronic Systolic Heart Failure w/ Low Output - Echo 4/21 LVEF 40%.  RV ok. Mild MR - Echo 5/21 LVEF 35-40%. RV ok, moderate MR - Echo 6/21 LVEF 25-40%, RV ok, moderate MR, prosthetic AV ok  - Admitted w/ NYHA Class IIIb symptoms w/ RHC demonstrating elevated filling pressures and low CO/CI. PCWP 27 mmHg. RA pressure 13 mmHg. CI 1.6 by FICK, 1.3 by Thermo - started on milrinone w/ good diuresis.  - Off milrinone. Co-ox 56% - Volume status much improved. CVP 7. Continue PO lasix 20 mg daily.  - Continue bisoprolol 2.5 mg daily.  - Continue 0.125 mg digoxin.  - Continue spironolactone 12.5 mg qhs - start ARNi/ ARB soon if scr stabilizes  - RHC w/ prominent V-waves on PCWP tracing; query more severe mitral regurgitation than reported on echocardiogram in 02/2020. However repeat echo shows only moderate MR - not a candidate CTR-P per EP   2. CAD - LHC in 2017 showed moderate nonobstructive disease - LCH this admit w/ progressive disease: severe 2VD w/ 80-90% proximal LAD stenosis involving ostium of D1 (70% D1 stenosis), and sequential 50% ostial and proximal LCx lesions followed by 80% mid/distal LCx stenosis (similar in appearance to 2017). Moderate, diffuse RCA disease similar to 2017. - No chest pain.  - Dr Haroldine Laws discussed with Dr Burt Knack and no plan for intervention.  - Can stop heparin drip.  - Continue 81 mg asa .  - Continue statin.  - continue bisoprolol  - Due to history of clopidogrel allergy, he would need to be treated with at least 3 months (ideally at least 6 months) of ticagrelor and warfarin, at which time transition to aspirin and warfarin could be considered.   3. Mitral Regurgitation - Echo 02/2020 showed only mild MR - RHC w/ prominent V-waves on PCWP tracing; query more severe mitral regurgitation than reported on echocardiogram in 02/2020.  - 2D Echo repeatedtoday and felt to be moderate - suspect  functional MR. He has been diuresed    4. H/o Aortic Stenosis w/ TAVR - s/p TAVR 07/2016 - Echo 4/21 showed stable prosthesis. Mean gradient 5 mmHg. - Stable on Echo this admit    5. Carotid Artery Disease  - s/p Rt CEA 2017  - last carotid dopplers 2018 showed patent RICA and mod (50-69%) Lt ICA stenosis - asymptomatic - continue medical therapy  - outpatient surveillance per gen cards   6. Chronic Afib - Stopping milrinone as noted above. Can stop IV amio as this was used for rate control.  - Continue bisoprolol 2.5 mg daily.   - Restart  coumadin. Continue heparin drip while he is here.   7. T2DM - poorly controlled. Hgb A1c 3/21 was 9.8  - hold metformin  - cover w/ SSI  - consider SGLT2i (GFR 44)   8. Stage III CKD - baseline SCr ~1.3-1.5 - scr 1.67 today    9. ? Pulmonary Fibrosis - CXR 4/21 showed Interstitial fibrotic changes in the bases - outpatient pulmonology referral pending   10. Debility and Dementia - PT has recommended SNF - awaiting bed placement   Plan d/c to SNF once bed is available.    Length of Stay: [address] PA-C  05/14/2020, 10:18 AM  Advanced Heart Failure Team Pager [phone] (M-F; 7a - 4p)  Please contact [facility] for night-coverage after hours (4p -7a ) and weekends on [email or web address]   Patient seen and examined with the above-signed Advanced Practice Provider and/or Housestaff. I personally reviewed laboratory data, imaging studies and relevant notes. I independently examined the patient and formulated the important aspects of the plan. I have edited the note to reflect any of my changes or salient points. I have personally discussed the plan with the patient and/or family.   He is stable today. Denies CP or SOB. AF rate better controlled.  Co-ox ok off milrinone. Creatinine up just slightly.   General: Sitting up in bed No resp difficulty HEENT: normal Neck: supple. no JVD. Carotids 2+ bilat; no bruits. No  lymphadenopathy or thryomegaly appreciated. Cor: PMI nondisplaced. Irregular rate & rhythm. No rubs, gallops or murmurs. Lungs: clear Abdomen: soft, nontender, nondistended. No hepatosplenomegaly. No bruits or masses. Good bowel sounds. Extremities: no cyanosis, clubbing, rash, trace edema Neuro: alert & oriented x 2  cranial nerves grossly intact. moves all 4 extremities w/o difficulty. Affect pleasant  Case reviewed extensively with EP and Interventional team (inclunding Dr. Copper - his primary cardiologist). Not candidate for CRT or LAD stent at this point as likely not to benefit. Stable for d/c to [facility] today. Would increase lasix to 40 mg daily. Consider change to torsemide as needed.   [name], MD  5:45 PM

## 2020-05-14 NOTE — Progress Notes (Signed)
Arrived to patient's room. Explained procedure. HOB less than 45*, patient held breath upon line removal. Pressure held for 5 min. Pressure drsg applied and pt instructed to remain in bed for 30 min. Instructed to keep drsg CDI for 24 hrs. CG at bedside. Patient's nurse notified. Fran Lowes, RN VAST

## 2020-05-14 NOTE — TOC Transition Note (Signed)
Transition of Care [facility]) - CM/SW Discharge Note   Patient Details  Name: [name] [name] MRN: [id number] Date of Birth: 01/28/1932  Transition of Care [facility]) CM/SW Contact:  Emeterio Reeve, [place] Phone Number: 05/14/2020, 3:33 PM   Clinical Narrative:     Pt is discharging to [facility] via [facility]. PTs niece [name] has been notified   Nurse can call report to [phone].  Final next level of care: Skilled Nursing Facility Barriers to Discharge: Barriers Resolved   Patient Goals and CMS Choice Patient states their goals for this hospitalization and ongoing recovery are:: PT did not verbalize goals CMS Medicare.gov Compare Post Acute Care list provided to:: Patient Choice offered to / list presented to : Patient, [place] / [place]  Discharge Placement              Patient chooses bed at: [facility] Patient to be transferred to facility by: ptar Name of family member notified: [name] Patient and family notified of of transfer: 05/14/20  Discharge Plan and Services                                     Social Determinants of Health (SDOH) Interventions     Readmission Risk Interventions No flowsheet data found.    Emeterio Reeve, Latanya Presser, [place] Social Worker [phone]

## 2020-05-14 NOTE — Progress Notes (Signed)
Norm Parcel at [phone] to let him know [facility] here to transport the patient

## 2020-05-14 NOTE — TOC Progression Note (Signed)
Transition of Care [facility]) - Progression Note    Patient Details  Name: [name] [name] MRN: [id number] Date of Birth: 1932/05/28  Transition of Care [facility]) CM/SW [place], [place] Phone Number: 05/14/2020, 1:53 PM  Clinical Narrative:     Pts ptar request has been apporved Josem Kaufmann is [phone]. Pts sn[facility] request for [facility] has been approved [phone].   Expected Discharge Plan: [place] Barriers to Discharge: Continued Medical Work up  Expected Discharge Plan and Services Expected Discharge Plan: [place] arrangements for the past 2 months: Single Family Home                                       Social Determinants of Health (SDOH) Interventions    Readmission Risk Interventions No flowsheet data found.  Emeterio Reeve, Latanya Presser, [place] Social Worker [phone]

## 2020-05-14 NOTE — Progress Notes (Signed)
Called to [facility] and updated Claiborne Billings RN on lasix dose increase to 40 mg PO daily and advised patient was given pm lantus dose scheduled for 1800hrs.

## 2020-05-15 ENCOUNTER — Ambulatory Visit: Payer: PPO | Admitting: Family Medicine

## 2020-05-15 ENCOUNTER — Other Ambulatory Visit (HOSPITAL_COMMUNITY)
Admission: RE | Admit: 2020-05-15 | Discharge: 2020-05-15 | Disposition: A | Discharge status: Home / Self Care | Payer: PPO | Source: Skilled Nursing Facility | Attending: Internal Medicine | Admitting: Internal Medicine

## 2020-05-15 ENCOUNTER — Non-Acute Institutional Stay (SKILLED_NURSING_FACILITY): Payer: PPO | Admitting: Adult Health

## 2020-05-15 ENCOUNTER — Encounter: Payer: Self-pay | Admitting: Adult Health

## 2020-05-15 DIAGNOSIS — E785 Hyperlipidemia, unspecified: Secondary | ICD-10-CM

## 2020-05-15 DIAGNOSIS — E1121 Type 2 diabetes mellitus with diabetic nephropathy: Secondary | ICD-10-CM | POA: Diagnosis not present

## 2020-05-15 DIAGNOSIS — Z7901 Long term (current) use of anticoagulants: Secondary | ICD-10-CM | POA: Diagnosis not present

## 2020-05-15 DIAGNOSIS — E876 Hypokalemia: Secondary | ICD-10-CM | POA: Insufficient documentation

## 2020-05-15 DIAGNOSIS — I2510 Atherosclerotic heart disease of native coronary artery without angina pectoris: Secondary | ICD-10-CM

## 2020-05-15 DIAGNOSIS — E1122 Type 2 diabetes mellitus with diabetic chronic kidney disease: Secondary | ICD-10-CM | POA: Diagnosis not present

## 2020-05-15 DIAGNOSIS — N1831 Chronic kidney disease, stage 3a: Secondary | ICD-10-CM

## 2020-05-15 DIAGNOSIS — J4520 Mild intermittent asthma, uncomplicated: Secondary | ICD-10-CM

## 2020-05-15 DIAGNOSIS — E1169 Type 2 diabetes mellitus with other specified complication: Secondary | ICD-10-CM | POA: Insufficient documentation

## 2020-05-15 DIAGNOSIS — F0390 Unspecified dementia without behavioral disturbance: Secondary | ICD-10-CM | POA: Diagnosis not present

## 2020-05-15 DIAGNOSIS — E1142 Type 2 diabetes mellitus with diabetic polyneuropathy: Secondary | ICD-10-CM

## 2020-05-15 DIAGNOSIS — Z952 Presence of prosthetic heart valve: Secondary | ICD-10-CM

## 2020-05-15 DIAGNOSIS — I4811 Longstanding persistent atrial fibrillation: Secondary | ICD-10-CM | POA: Diagnosis not present

## 2020-05-15 DIAGNOSIS — I5023 Acute on chronic systolic (congestive) heart failure: Secondary | ICD-10-CM

## 2020-05-15 DIAGNOSIS — K5909 Other constipation: Secondary | ICD-10-CM

## 2020-05-15 DIAGNOSIS — F5104 Psychophysiologic insomnia: Secondary | ICD-10-CM

## 2020-05-15 DIAGNOSIS — N183 Chronic kidney disease, stage 3 unspecified: Secondary | ICD-10-CM

## 2020-05-15 DIAGNOSIS — Z794 Long term (current) use of insulin: Secondary | ICD-10-CM

## 2020-05-15 LAB — PROTIME-INR
INR: 1.3 — ABNORMAL HIGH (ref 0.8–1.2)
Prothrombin Time: 15.4 seconds — ABNORMAL HIGH (ref 11.4–15.2)

## 2020-05-15 NOTE — Progress Notes (Addendum)
Location:    [place] Room Number: 144/P Place of Service:  SNF (31)   CODE STATUS: Full Code  Allergies  Allergen Reactions  . Ambien [Zolpidem Tartrate] Other (See Comments)    Sleep walks  . Lipitor [Atorvastatin Calcium] Other (See Comments)    myalgias  . Ranitidine Other (See Comments)    Chest discomfort  . Simvastatin Other (See Comments)    Myalgias  . Xanax Xr [Alprazolam Er] Other (See Comments)    Tightness in chest  . Cholestatin Other (See Comments)    UNSPECIFIED REACTION   . Clopidogrel Bisulfate Rash    Chief Complaint  Patient presents with  . Hospitalization Follow-up    Hospitalization Follow Up    HPI:  He is a 86 year old man who has been hospitalized from 05-11-20 through 05-14-20. He went for an diagnostic cardiac cath; was found to be in fluid overload and was admitted to the hospital. He was started on a milrinone drip. He was transitioned to po diuretics. His medical treatment was maximized; he was not placed on ace/arb due to soft blood pressures. His coumadin was restarted. He is here for short term rehab with his goal to return back home. He denies any chest pain or shortness of breath. He did state that he is tired; but is feeling better. He is somewhat disoriented to his location. 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 Acute on chronic systolic CHF (congestive heart failure) ([place]) 02/09/2017  . Acute respiratory failure with hypoxia ([place])   . AKI (acute kidney injury) ([place]) 08/20/2015  . Altered mental status 02/21/2017  . Aortic stenosis    a. mod-sev by echo 05/2016.  . Asthma   . Asthma exacerbation 08/20/2015  .  Atrial fibrillation (HCC)    Paroxysmal; Echo in 2007-normal EF; mild LVH; LA enlargement; mild AS, minimal AI; neg. stress nuclear study in 2008   . BPH (benign prostatic hypertrophy) with urinary obstruction 09/27/2011   Transurethral resection of the prostate in 09/2011   . CAD (coronary artery disease), native coronary artery 06/28/2016  . Cardiogenic shock ([place])   . Carotid stenosis 04/25/2012  . Cerebrovascular disease 07/2010   TIA; carotid ultrasound in 07/2010-significant bilateral plaque without focal internal carotid artery stenosis; MRI -encephalomalacia left temporal and right temporal lobes; small inferior right cerebellar infarct; small vessel disease  . Chronic atrial fibrillation (HCC)    Paroxysmal; Echocardiogram in 2007-normal EF; mild LVH; left atrial enlargement; mild stenosis and minimal AI; negative stress nuclear study in 2008  . Chronic systolic CHF (congestive heart failure) ([place])    a. dx 05/2016 - EF 35-40%, diffuse HK, mod-severe AS, mod gradient, severe AVA VTI likely due to decreased cardiac output in setting of systolic dysfsunction and significant mitral regurgitation, mild MR, mod-severe MR, severe LAE, mild-mod RV dilation, mild RAE, mild-mod TR, mod PASP 3mmHg.  . CKD (chronic kidney disease), stage II   . Cognitive dysfunction 05/29/2015   I believe that this is related to his age as well as previous stroke   . Congestive heart failure (CHF) ([place]) 02/09/2017  . Degenerative joint disease    feet and legs  . Diabetes mellitus    no  insulin; A1c of 6.6 in 2005  . Dizziness    occurs daily,especially in am  . Elevated troponin   . Exertional dyspnea   . Gastroesophageal reflux disease   . Hepatic steatosis   . History of noncompliance with medical treatment   . Hyperlipidemia    adverse reactions to statins and niacin  . Hypertension    Borderline  . Irregular heartbeat   . Mitral regurgitation    a. mod-sev by echo 05/2016  . Peripheral vascular disease  ([place])   . Renal insufficiency   . S/P TAVR (transcatheter aortic valve replacement) 07/19/2016   29 mm Edwards Sapien 3 transcatheter heart valve placed via left percutaneous transfemoral approach  . Senile purpura ([place]) 08/15/2018  . Temporal arteritis ([place])   . Tricuspid regurgitation    a. mild-mod TR by echo 05/2016  . VT (ventricular tachycardia) ([place])     Past Surgical History:  Procedure Laterality Date  . COLONOSCOPY  2002  . COLONOSCOPY  01/19/2012   Procedure: COLONOSCOPY;  Surgeon: Rogene Houston, MD;  [facility];  Service: Endoscopy;  Laterality: N/A;  1030  . LIPOMA EXCISION  1980  . ORIF ANKLE FRACTURE  2000   Right  . PERIPHERAL VASCULAR CATHETERIZATION N/A 07/11/2016   Procedure: Carotid PTA/Stent Intervention;  Surgeon: Lorretta Harp, MD;  Location: [facility];  Service: Cardiovascular;  Laterality: N/A;  . PROSTATE SURGERY  12/2011  . RIGHT HEART CATH AND CORONARY ANGIOGRAPHY N/A 05/11/2020   Procedure: RIGHT HEART CATH AND CORONARY ANGIOGRAPHY;  Surgeon: Nelva Bush, MD;  Location: [facility];  Service: Cardiovascular;  Laterality: N/A;  . ROTATOR CUFF REPAIR     Right  . TEE WITHOUT CARDIOVERSION N/A 06/17/2016   Procedure: TRANSESOPHAGEAL ECHOCARDIOGRAM (TEE);  Surgeon: Jerline Pain, MD;  Location: [facility];  Service: Cardiovascular;  Laterality: N/A;  . TEE WITHOUT CARDIOVERSION N/A 07/19/2016   Procedure: TRANSESOPHAGEAL ECHOCARDIOGRAM (TEE);  Surgeon: Sherren Mocha, MD;  Location: [facility];  Service: Open Heart Surgery;  Laterality: N/A;  . TRANSCATHETER AORTIC VALVE REPLACEMENT, TRANSFEMORAL N/A 07/19/2016   Procedure: TRANSCATHETER AORTIC VALVE REPLACEMENT, TRANSFEMORAL;  Surgeon: Sherren Mocha, MD;  Location: [facility];  Service: Open Heart Surgery;  Laterality: N/A;  . TRANSURETHRAL RESECTION OF PROSTATE  09/2011  . URETHRAL STRICTURE DILATATION  1980s    Social History   Socioeconomic History  . Marital status: Widowed     Spouse name: Not on file  . Number of children: 1  . Years of education: Not on file  . Highest education level: Not on file  Occupational History  . Occupation: Retired  Tobacco Use  . Smoking status: Former Smoker    Packs/day: 1.00    Years: 20.00    Pack years: 20.00    Types: Cigarettes    Start date: 07/04/1950    Quit date: 04/25/1992    Years since quitting: 28.0  . Smokeless tobacco: Current User    Types: Chew  Substance and Sexual Activity  . Alcohol use: No    Alcohol/week: 0.0 standard drinks  . Drug use: No  . Sexual activity: Not Currently  Other Topics Concern  . Not on file  Social History Narrative  . Not on file   Social Determinants of Health   Financial Resource Strain:   . Difficulty of Paying Living Expenses:   Food Insecurity:   . Worried About Charity fundraiser in the Last Year:   . Ran  Out of Food in the Last Year:   Transportation Needs:   . Lack of Transportation (Medical):   [name] Lack of Transportation (Non-Medical):   Physical Activity:   . Days of Exercise per Week:   . Minutes of Exercise per Session:   Stress:   . Feeling of Stress :   Social Connections:   . Frequency of Communication with Friends and Family:   . Frequency of Social Gatherings with Friends and Family:   . Attends Religious Services:   . Active Member of Clubs or Organizations:   . Attends Archivist Meetings:   [name] Marital Status:   Intimate Partner Violence:   . Fear of Current or Ex-Partner:   . Emotionally Abused:   [name] Physically Abused:   . Sexually Abused:    Family History  Problem Relation Age of Onset  . Stroke Mother   . Diabetes Father   . Colon cancer Neg Hx       VITAL SIGNS BP 136/80   Pulse 72   Temp (!) 97 F (36.1 C) (Oral)   Resp 18   Ht 6\' 1"  (1.854 m)   Wt 222 lb 9.6 oz (101 kg)   SpO2 98%   BMI 29.37 kg/m   Outpatient Encounter Medications as of 05/15/2020  Medication Sig  . albuterol (PROVENTIL) (2.5 MG/3ML) 0.083%  nebulizer solution Take 3 mLs (2.5 mg total) by nebulization every 6 (six) hours as needed for wheezing or shortness of breath.  [name] aspirin EC 81 MG EC tablet Take 1 tablet (81 mg total) by mouth daily. Swallow whole.  . bisoprolol (ZEBETA) 5 MG tablet Take 0.5 tablets (2.5 mg total) by mouth daily.  . cholecalciferol (VITAMIN D3) 25 MCG (1000 UNIT) tablet Take 1,000 Units by mouth in the morning and at bedtime.  . digoxin (LANOXIN) 0.125 MG tablet Take 1 tablet (0.125 mg total) by mouth daily.  . furosemide (LASIX) 40 MG tablet Take 1 tablet (40 mg total) by mouth daily.  . insulin glargine (LANTUS SOLOSTAR) 100 UNIT/ML Solostar Pen INJECT 32 UNITS EVERY EVENING.  . Melatonin 10 MG TABS Take 10 mg by mouth at bedtime.  . metFORMIN (GLUCOPHAGE-XR) 500 MG 24 hr tablet TAKE 1 TABLET BY MOUTH ONCE DAILY WITH BREAKFAST.  . NON FORMULARY Diet: _x____ Regular, ______ NAS, ____x___Consistent Carbohydrate, _______NPO _____Other  . NON FORMULARY Diet - Liquids: _x__Regular; ___Thickened ___ Consistency: ___ Nectar, ___Honey, ___ Pudding; ____ Fluid Restriction  . nortriptyline (PAMELOR) 10 MG capsule Take 10 mg by mouth at bedtime. For burning in feet  . potassium chloride SA (KLOR-CON) 20 MEQ tablet TAKE 1 TABLET BY MOUTH ON MONDAY, WEDNESDAY AND FRIDAY.  [name] psyllium (METAMUCIL) 58.6 % powder Take 1 packet by mouth daily.  . rosuvastatin (CRESTOR) 5 MG tablet TAKE (1) TABLET BY MOUTH ONCE DAILY AT BEDTIME.  [name] spironolactone (ALDACTONE) 25 MG tablet Take 0.5 tablets (12.5 mg total) by mouth at bedtime.  [name] warfarin (COUMADIN) 6 MG tablet Take 6 mg by mouth daily.   No facility-administered encounter medications on file as of 05/15/2020.     SIGNIFICANT DIAGNOSTIC EXAMS  TODAY   05-11-20: chest x-ray:  1. Well-positioned right-sided PICC line. 2. Cardiomegaly with mild vascular congestion. 3. Hazy ill-defined opacity overlying the right lower lung zone may represent atelectasis or  infiltrate.  05-11-20: cardiac catheterization: 1. Severe two-vessel coronary artery disease, including 80-90% proximal LAD stenosis involving ostium of D1 (70% D1 stenosis), and sequential 50% ostial and proximal LCx  lesions followed by 80% mid/distal LCx stenosis (similar in appearance to 2017) 2. Moderate, diffuse RCA disease similar to 2017. 3. Moderately elevated left heart, right heart, and pulmonary artery pressures. 4. Prominent V-waves on PCWP tracing; query more severe mitral regurgitation than reported on echocardiogram in 02/2020. 5. Severely reduced cardiac output/index.   05-12-20: 2-d echo:  Left ventricular ejection fraction, by estimation, is 35 to 40%. The left ventricle has moderately decreased function. The left ventricle  demonstrates global hypokinesis. Left ventricular diastolic function could  not be evaluated.   LABS REVIEWED TODAY  05-11-20: hgb a1c 7.8 05-12-20: wbc 7.4; hgb 13.8; hct 42.3; mcv 89.6 plt 188; glucose 185; bun 24; creat 1.46; k+ 3.7; na++ 137; ca 9.6  05-14-20: wbc 8.9; hgb 14.4; hct 43.3; mcv 88.5 plt 205; glucose 192; bun 29; creat 1.67; k+ 4.2; na++ 135; ca 9.6 INR 1.1 05-15-20: INR 1.3    Review of Systems  Constitutional: Positive for malaise/fatigue.  Respiratory: Negative for cough and shortness of breath.   Cardiovascular: Negative for chest pain, palpitations and leg swelling.  Gastrointestinal: Negative for abdominal pain, constipation and heartburn.  Musculoskeletal: Negative for back pain, joint pain and myalgias.  Skin: Negative.   Neurological: Negative for dizziness.  Psychiatric/Behavioral: The patient is not nervous/anxious.     Physical Exam Constitutional:      General: He is not in acute distress.    Appearance: He is well-developed and overweight. He is not diaphoretic.  Neck:     Thyroid: No thyromegaly.  Cardiovascular:     Rate and Rhythm: Normal rate. Rhythm irregular.     Pulses: Normal pulses.     Heart sounds:  Normal heart sounds.  Pulmonary:     Effort: Pulmonary effort is normal. No respiratory distress.     Breath sounds: Normal breath sounds.  Abdominal:     General: Bowel sounds are normal. There is no distension.     Palpations: Abdomen is soft.     Tenderness: There is no abdominal tenderness.  Musculoskeletal:     Cervical back: Neck supple.     Right lower leg: No edema.     Left lower leg: No edema.     Comments: Is able to move all extremities   Lymphadenopathy:     Cervical: No cervical adenopathy.  Skin:    General: Skin is warm and dry.     Comments: Bilateral lower extremities discolored   Neurological:     Mental Status: He is alert. Mental status is at baseline.     Comments: Is disoriented   Psychiatric:        Mood and Affect: Mood normal.      ASSESSMENT/ PLAN:  TODAY  1. Acute on chronic systolic congestive heart failure NYHA class 3: EF 35-40% (05-12-20) is stable will continue zibete 2.5 mg twice daily lasix 40 mg daily aldactone 12.5 mg daily digoxin 0.125 mg daily will monitor   2. Longstanding persistent atrial fibrillation/ s/p TAVR (transcatheter aortic valve replacement) heart rate is stable. Will continue zibeta 2.5 mg twice daily  digoxin 0.125 mg   daily for rate control is on long term coumadin therapy.   3. Long term current use of anticoagulation: INR today is 1.3 will begin coumadin 7.5 mg daily and will check INR 05-19-20.   4. 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 Diabetic peripheral neuropathy associated with type 2 diabetes mellitus: is stable will  continue pamelor 10 mg nightly   6. Type 2 diabetes mellitus with stage 3a chronic kidney disease with long term current use of insulin: is stable hgb a1c 7.8 will continue metformin xr 500 mgm daily and lantus 32 units nightly   7. Hyperlipidemia associated with type 2 diabetes mellitus: is stable will continue crestor 5 mg daily   8.  Coronary artery disease involving native coronary artery of native heart without angina: is stable will continue zibeta 2.5 mg twice daily asa 81 mg daily   9. Mild intermittent chronic asthma without complication: is stable will continue albuterol neb treatment every 6 hours as needed  10. Dementia without behavioral disturbance unspecified dementia type: is stable will continue to monitor   11. Hypokalemia: is stable k+ 4.2 will continue k+ 2 meq three times weekly   12. Chronic constipation: is stable will continue metamucil daily  13. Chronic insomnia: is stable will continue melatonin 10 mg nightly   Will check INR bmp on 05-19-20     MD is aware of resident's narcotic use and is in agreement with current plan of care. We will attempt to wean resident as appropriate.  Ok Edwards NP [facility]  Contact [phone] Monday through Friday 8am- 5pm  After hours call [phone]

## 2020-05-19 ENCOUNTER — Non-Acute Institutional Stay (SKILLED_NURSING_FACILITY): Payer: PPO | Admitting: Internal Medicine

## 2020-05-19 ENCOUNTER — Encounter: Payer: Self-pay | Admitting: Internal Medicine

## 2020-05-19 ENCOUNTER — Other Ambulatory Visit (HOSPITAL_COMMUNITY)
Admission: RE | Admit: 2020-05-19 | Discharge: 2020-05-19 | Disposition: A | Discharge status: Home / Self Care | Payer: PPO | Source: Skilled Nursing Facility | Attending: Adult Health | Admitting: Adult Health

## 2020-05-19 DIAGNOSIS — F0390 Unspecified dementia without behavioral disturbance: Secondary | ICD-10-CM

## 2020-05-19 DIAGNOSIS — I5023 Acute on chronic systolic (congestive) heart failure: Secondary | ICD-10-CM | POA: Diagnosis not present

## 2020-05-19 DIAGNOSIS — N1832 Chronic kidney disease, stage 3b: Secondary | ICD-10-CM | POA: Diagnosis not present

## 2020-05-19 DIAGNOSIS — I4811 Longstanding persistent atrial fibrillation: Secondary | ICD-10-CM | POA: Diagnosis not present

## 2020-05-19 DIAGNOSIS — E1121 Type 2 diabetes mellitus with diabetic nephropathy: Secondary | ICD-10-CM

## 2020-05-19 DIAGNOSIS — Z794 Long term (current) use of insulin: Secondary | ICD-10-CM | POA: Diagnosis not present

## 2020-05-19 DIAGNOSIS — N179 Acute kidney failure, unspecified: Secondary | ICD-10-CM | POA: Diagnosis not present

## 2020-05-19 LAB — PROTIME-INR
INR: 3.1 — ABNORMAL HIGH (ref 0.8–1.2)
Prothrombin Time: 31 seconds — ABNORMAL HIGH (ref 11.4–15.2)

## 2020-05-19 LAB — BASIC METABOLIC PANEL
Anion gap: 14 (ref 5–15)
BUN: 45 mg/dL — ABNORMAL HIGH (ref 8–23)
CO2: 23 mmol/L (ref 22–32)
Calcium: 9.4 mg/dL (ref 8.9–10.3)
Chloride: 98 mmol/L (ref 98–111)
Creatinine, Ser: 1.91 mg/dL — ABNORMAL HIGH (ref 0.61–1.24)
GFR calc Af Amer: 36 mL/min — ABNORMAL LOW (ref >60)
GFR calc non Af Amer: 31 mL/min — ABNORMAL LOW (ref >60)
Glucose, Bld: 34 mg/dL — CL (ref 70–99)
Potassium: 4.4 mmol/L (ref 3.5–5.1)
Sodium: 135 mmol/L (ref 135–145)

## 2020-05-19 NOTE — Assessment & Plan Note (Signed)
Cardiology follow-up with Richardson Dopp 05/27/2020

## 2020-05-19 NOTE — Assessment & Plan Note (Addendum)
Caregivers state that his neurocognitive issues have worsened since his catheterization and hospitalization. Unable to find his room or identify his caregivers by their full names.  They stated that he seemed to be unaware that his wife had died. Consider  Urine C&S and SLUMS MMSE if symptoms fail to improve or  progress

## 2020-05-19 NOTE — Patient Instructions (Signed)
See assessment and plan under each diagnosis in the problem list and acutely for this visit 

## 2020-05-19 NOTE — Progress Notes (Signed)
NURSING HOME LOCATION:  [place] NUMBER:  [id number]  CODE STATUS: Full code  PCP: Sallee Lange MD  This is a comprehensive admission note to [facility] performed on this date less than 30 days from date of admission. Included are preadmission medical/surgical history; reconciled medication list; family history; social history and comprehensive review of systems.  Corrections and additions to the records were documented. Comprehensive physical exam was also performed. Additionally a clinical summary was entered for each active diagnosis pertinent to this admission in the Problem List to enhance continuity of care.  HPI: Patient was hospitalized 6/28-05/14/2020 with acute on chronic systolic heart failure, NYHA class IIIb with low output in the context of multivessel CAD and chronic atrial fibrillation.  Course was complicated by AKI on CKD, stage IIIb. Pertinent history includes AS with TAVR in 2017. He presented for diagnostic right/left heart cath for worsening dyspnea.  Cath 6/28 demonstrated elevated filling pressures compatible with volume overload, low output heart failure and significant two-vessel CAD.  CI was 1.6 by Fick calculation and 1.3 by thermodilution.  Angiography documented 80-90% proximal LAD stenosis involving the ostium of D1 with sequential 50% ostial and proximal left circumflex lesions followed by 80% mid/distal left circumflex stenosis.  Moderate, diffuse RCA disease was similar to 2017 findings. He was da direct admission from the Cath Lab;and milrinone, IV Lasix, and IV heparin initiated.  Echo was repeated and demonstrated LVEF of 25-40%, lower than on prior study when EF was 40%.  Moderate MR was present.  He diuresed well and hemodynamics improved on milrinone.  He was transitioned to oral diuretics.  EP was evaluated for potential CRT pacing but he was not deemed to be a candidate given QRS duration, advanced age, and CAF.  No intervention for the  LAD lesions was performed because of absence of chest pain.  Heparin was transitioned back to warfarin for A. Fib. PT/OT recommended placement at SNF for rehab.  This morning fasting glucose was reported as 66 by staff but 34 by the lab.  It was rechecked at 9:30 revealing a value of 91.  He is on low-dose Metformin as well as 32 units of glargine insulin. PT/INR is now 3.1 on 7.5 mg of warfarin daily.  It has been 1.34 days prior on 6 mg qd.  Past medical and surgical history: Includes insulin-dependent diabetes, history of temporal arteritis, history of SVT, PVD, essential hypertension, dyslipidemia, GERD, hepatic steatosis, history of stroke, and cognitive dysfunction most likely from vascular dementia. In addition to transcatheter AV replacement he has had TURP and urethral stricture dilation remotely.  Social history: Nondrinker, former smoker.  Family history: History reviewed.  Noncontributory due to advanced age.   Review of systems: When I first saw the patient he was in his wheelchair returning from PT/OT.  Staff was trying to direct him to his room but he was having difficulty comprehending its location. Date given as "May 16, 2019" by patient.  History provided by his 2 caregivers who are visiting him.  He was only able to identify only one of the 2 caregivers and only by her first name.He was unaware that his wife had died.   He describes shortness of breath.  One of the caregivers stated that he was nauseated yesterday and also "swimmy headed".  He validates constipation.   He states that this morning he had some difficulty urinating.They questioned whether he might have a UTI.  He has no other GU symptoms.  The caregiver states that he sleeps poorly at home.  They feel that his neurocognitive issues have worsened since the catheterization and hospitalization.    Constitutional: No fever, significant weight change  Eyes: No redness, discharge, pain, vision change ENT/mouth: No  nasal congestion, purulent discharge, earache, change in hearing, sore throat  Cardiovascular: No chest pain, palpitations, paroxysmal nocturnal dyspnea Respiratory: No cough, sputum production, hemoptysis,  significant snoring, apnea  Gastrointestinal: No heartburn, dysphagia, abdominal pain, vomiting, rectal bleeding, melena Genitourinary: No dysuria, hematuria, pyuria, incontinence, nocturia Musculoskeletal: No joint stiffness, joint swelling, weakness, pain Dermatologic: No rash, pruritus, change in appearance of skin Neurologic: No  headache, syncope, seizures, numbness, tingling Psychiatric: No significant anxiety, depression Endocrine: No change in hair/skin/nails, excessive thirst, excessive hunger, excessive urination  Hematologic/lymphatic: No significant bruising, lymphadenopathy, abnormal bleeding Allergy/immunology: No itchy/watery eyes, significant sneezing, urticaria, angioedema  Physical exam:  Pertinent or positive findings: He appears his stated age.  Pattern alopecia is present.  He has complete dentures.  Heart rate is slow and irregular.  Chest is surprisingly clear.  Abdomen is protuberant.  Pedal pulses are decreased.  Stasis dermatitis changes are present greater over the right shin than the left.  He has scarring and bruising over the forearms.  There is eschar over the biopsy site of the chin.Strength to opposition is poor.  General appearance: Adequately nourished; no acute distress, increased work of breathing is present.   Lymphatic: No lymphadenopathy about the head, neck, axilla. Eyes: No conjunctival inflammation or lid edema is present. There is no scleral icterus. Ears:  External ear exam shows no significant lesions or deformities.   Nose:  External nasal examination shows no deformity or inflammation. Nasal mucosa are pink and moist without lesions, exudates Oral exam: Lips and gums are healthy appearing.There is no oropharyngeal erythema or exudate. Neck:   No thyromegaly, masses, tenderness noted.    Heart:  No gallop, murmur, click, rub.  Lungs:  without wheezes, rhonchi, rales, rubs. Abdomen: Bowel sounds are normal.  Abdomen is soft and nontender with no organomegaly, hernias, masses. GU: Deferred  Extremities:  No cyanosis, clubbing, edema. Neurologic exam:  Strength equal  in upper & lower extremities. Balance, Rhomberg, finger to nose testing could not be completed due to clinical state Skin: Warm & dry w/o tenting.  See clinical summary under each active problem in the Problem List with associated updated therapeutic plan

## 2020-05-19 NOTE — Assessment & Plan Note (Addendum)
Rate controlled 05/19/2020 PT/INR is 3.1 on 7.5 mg of warfarin daily. Decrease warfarin to 5 mg today and recheck PT tomorrow.

## 2020-05-19 NOTE — Assessment & Plan Note (Signed)
7/1 creatinine was 1.67 and GFR 37.  Today creatinine is 1.91 and GFR 31.  Metformin will be discontinued. Renal function will be monitored as he is also on spironolactone.

## 2020-05-19 NOTE — Assessment & Plan Note (Addendum)
05/19/2020 fasting glucose 66 at 6 AM with call report of 34.  Recheck at 9:30 revealed a value of 91.  Glargine will be decreased to 28 units. Metformin will be discontinued because of elevation of creatinine from 1.67 at discharge to 1.91.  GFR has dropped from 37 down to 31.

## 2020-05-20 ENCOUNTER — Other Ambulatory Visit (HOSPITAL_COMMUNITY)
Admission: RE | Admit: 2020-05-20 | Discharge: 2020-05-20 | Disposition: A | Discharge status: Home / Self Care | Payer: PPO | Source: Skilled Nursing Facility | Attending: Internal Medicine | Admitting: Internal Medicine

## 2020-05-20 DIAGNOSIS — Z7901 Long term (current) use of anticoagulants: Secondary | ICD-10-CM | POA: Diagnosis present

## 2020-05-20 LAB — PROTIME-INR
INR: 3.2 — ABNORMAL HIGH (ref 0.8–1.2)
Prothrombin Time: 31.5 seconds — ABNORMAL HIGH (ref 11.4–15.2)

## 2020-05-22 ENCOUNTER — Encounter: Payer: Self-pay | Admitting: Adult Health

## 2020-05-22 ENCOUNTER — Non-Acute Institutional Stay (SKILLED_NURSING_FACILITY): Payer: PPO | Admitting: Adult Health

## 2020-05-22 DIAGNOSIS — I5023 Acute on chronic systolic (congestive) heart failure: Secondary | ICD-10-CM | POA: Diagnosis not present

## 2020-05-22 DIAGNOSIS — I4811 Longstanding persistent atrial fibrillation: Secondary | ICD-10-CM

## 2020-05-22 DIAGNOSIS — Z7901 Long term (current) use of anticoagulants: Secondary | ICD-10-CM | POA: Diagnosis not present

## 2020-05-22 NOTE — Progress Notes (Signed)
Location:    [facility] Room Number: 144/P Place of Service:  SNF (31)  CODE STATUS: Full Code  Allergies  Allergen Reactions  . Ambien [Zolpidem Tartrate] Other (See Comments)    Sleep walks  . Lipitor [Atorvastatin Calcium] Other (See Comments)    myalgias  . Ranitidine Other (See Comments)    Chest discomfort  . Simvastatin Other (See Comments)    Myalgias  . Xanax Xr [Alprazolam Er] Other (See Comments)    Tightness in chest  . Cholestatin Other (See Comments)    UNSPECIFIED REACTION   . Clopidogrel Bisulfate Rash    Chief Complaint  Patient presents with  . Short Term Rehab           Acute on chronic systolic congestive heart failure NYHA class 3   Long standing persistent atrial fibrillation;  Long term current use of anticoagulation   Weekly follow up for the first 30 days post hospitalization.     HPI:  He is a 86 year old short term rehab patient of this facility being seen for the management of his chronic illnesses: chf; afib; anti-coagulation. There are no reports of uncontrolled pain; he continues to have some disorientation. There are no reports of agitation or anxiety.   Past Medical History:  Diagnosis Date  . Acute on chronic systolic CHF (congestive heart failure) ([place]) 02/09/2017  . Acute respiratory failure with hypoxia ([place])   . AKI (acute kidney injury) ([place]) 08/20/2015  . Altered mental status 02/21/2017  . Aortic stenosis    a. mod-sev by echo 05/2016.  . Asthma   . Asthma exacerbation 08/20/2015  . Atrial fibrillation (HCC)    Paroxysmal; Echo in 2007-normal EF; mild LVH; LA enlargement; mild AS, minimal AI; neg. stress nuclear study in 2008   . BPH (benign prostatic hypertrophy) with urinary obstruction 09/27/2011   Transurethral resection of the prostate in 09/2011   . CAD (coronary artery disease), native coronary artery 06/28/2016  . Cardiogenic shock ([place])   . Carotid stenosis 04/25/2012  . Cerebrovascular disease 07/2010    TIA; carotid ultrasound in 07/2010-significant bilateral plaque without focal internal carotid artery stenosis; MRI -encephalomalacia left temporal and right temporal lobes; small inferior right cerebellar infarct; small vessel disease  . Chronic atrial fibrillation (HCC)    Paroxysmal; Echocardiogram in 2007-normal EF; mild LVH; left atrial enlargement; mild stenosis and minimal AI; negative stress nuclear study in 2008  . Chronic systolic CHF (congestive heart failure) ([place])    a. dx 05/2016 - EF 35-40%, diffuse HK, mod-severe AS, mod gradient, severe AVA VTI likely due to decreased cardiac output in setting of systolic dysfsunction and significant mitral regurgitation, mild MR, mod-severe MR, severe LAE, mild-mod RV dilation, mild RAE, mild-mod TR, mod PASP 62mHg.  . CKD (chronic kidney disease), stage II   . Cognitive dysfunction 05/29/2015   I believe that this is related to his age as well as previous stroke   . [place] 02/09[place]2017  . Degenerative joint disease    feet and legs  . Diabetes mellitus    no insulin; A1c of 6.6 in 2005  . Dizziness    occurs daily,especially in am  . Elevated troponin   . Exertional dyspnea   . Gastroesophageal reflux disease   . Hepatic steatosis   . History of noncompliance with medical treatment   . Hyperlipidemia    adverse reactions to statins and niacin  . Hypertension    Borderline  .  Irregular heartbeat   . Mitral regurgitation    a. mod-sev by echo 05/2016  . Peripheral vascular disease ([place])   . Renal insufficiency   . S/P TAVR (transcatheter aortic valve replacement) 07/19/2016   29 mm Edwards Sapien 3 transcatheter heart valve placed via left percutaneous transfemoral approach  . Senile purpura ([place]) 08/15/2018  . Temporal arteritis ([place])   . Tricuspid regurgitation    a. mild-mod TR by echo 05/2016  . VT (ventricular tachycardia) ([place])     Past Surgical History:  Procedure Laterality Date  . COLONOSCOPY   2002  . COLONOSCOPY  01/19/2012   Procedure: COLONOSCOPY;  Surgeon: Rogene Houston, MD;  [facility];  Service: Endoscopy;  Laterality: N/A;  1030  . LIPOMA EXCISION  1980  . ORIF ANKLE FRACTURE  2000   Right  . PERIPHERAL VASCULAR CATHETERIZATION N/A 07/11/2016   Procedure: Carotid PTA/Stent Intervention;  Surgeon: Lorretta Harp, MD;  Location: [facility];  Service: Cardiovascular;  Laterality: N/A;  . PROSTATE SURGERY  12/2011  . RIGHT HEART CATH AND CORONARY ANGIOGRAPHY N/A 05/11/2020   Procedure: RIGHT HEART CATH AND CORONARY ANGIOGRAPHY;  Surgeon: Nelva Bush, MD;  Location: [facility];  Service: Cardiovascular;  Laterality: N/A;  . ROTATOR CUFF REPAIR     Right  . TEE WITHOUT CARDIOVERSION N/A 06/17/2016   Procedure: TRANSESOPHAGEAL ECHOCARDIOGRAM (TEE);  Surgeon: Jerline Pain, MD;  Location: [facility];  Service: Cardiovascular;  Laterality: N/A;  . TEE WITHOUT CARDIOVERSION N/A 07/19/2016   Procedure: TRANSESOPHAGEAL ECHOCARDIOGRAM (TEE);  Surgeon: Sherren Mocha, MD;  Location: [facility];  Service: Open Heart Surgery;  Laterality: N/A;  . TRANSCATHETER AORTIC VALVE REPLACEMENT, TRANSFEMORAL N/A 07/19/2016   Procedure: TRANSCATHETER AORTIC VALVE REPLACEMENT, TRANSFEMORAL;  Surgeon: Sherren Mocha, MD;  Location: [facility];  Service: Open Heart Surgery;  Laterality: N/A;  . TRANSURETHRAL RESECTION OF PROSTATE  09/2011  . URETHRAL STRICTURE DILATATION  1980s    Social History   Socioeconomic History  . Marital status: Widowed    Spouse name: Not on file  . Number of children: 1  . Years of education: Not on file  . Highest education level: Not on file  Occupational History  . Occupation: Retired  Tobacco Use  . Smoking status: Former Smoker    Packs/day: 1.00    Years: 20.00    Pack years: 20.00    Types: Cigarettes    Start date: 07/04/1950    Quit date: 04/25/1992    Years since quitting: 28.0  . Smokeless tobacco: Current User    Types: Chew    Substance and Sexual Activity  . Alcohol use: No    Alcohol/week: 0.0 standard drinks  . Drug use: No  . Sexual activity: Not Currently  Other Topics Concern  . Not on file  Social History Narrative  . Not on file   Social Determinants of Health   Financial Resource Strain:   . Difficulty of Paying Living Expenses:   Food Insecurity:   . Worried About Charity fundraiser in the Last Year:   . Arboriculturist in the Last Year:   Transportation Needs:   . Film/video editor (Medical):   [name] Lack of Transportation (Non-Medical):   Physical Activity:   . Days of Exercise per Week:   . Minutes of Exercise per Session:   Stress:   . Feeling of Stress :   Social Connections:   . Frequency of Communication with  Friends and Family:   . Frequency of Social Gatherings with Friends and Family:   . Attends Religious Services:   . Active Member of Clubs or Organizations:   . Attends Archivist Meetings:   [name] Marital Status:   Intimate Partner Violence:   . Fear of Current or Ex-Partner:   . Emotionally Abused:   [name] Physically Abused:   . Sexually Abused:    Family History  Problem Relation Age of Onset  . Stroke Mother   . Diabetes Father   . Colon cancer Neg Hx       VITAL SIGNS BP 124/73   Pulse 73   Temp (!) 97.2 F (36.2 C) (Oral)   Resp 16   Ht 6' (1.829 m)   Wt 222 lb 3.2 oz (100.8 kg)   SpO2 98%   BMI 30.14 kg/m   Outpatient Encounter Medications as of 05/22/2020  Medication Sig  . albuterol (PROVENTIL) (2.5 MG/3ML) 0.083% nebulizer solution Take 3 mLs (2.5 mg total) by nebulization every 6 (six) hours as needed for wheezing or shortness of breath.  [name] aspirin EC 81 MG EC tablet Take 1 tablet (81 mg total) by mouth daily. Swallow whole.  . bisoprolol (ZEBETA) 5 MG tablet Take 0.5 tablets (2.5 mg total) by mouth daily.  . cholecalciferol (VITAMIN D3) 25 MCG (1000 UNIT) tablet Take 1,000 Units by mouth in the morning and at bedtime.  . digoxin  (LANOXIN) 0.125 MG tablet Take 1 tablet (0.125 mg total) by mouth daily.  . furosemide (LASIX) 40 MG tablet Take 1 tablet (40 mg total) by mouth daily.  . insulin glargine (LANTUS SOLOSTAR) 100 UNIT/ML Solostar Pen Inject 28 Units into the skin every evening.  . Melatonin 10 MG TABS Take 10 mg by mouth at bedtime.  . NON FORMULARY Diet: _x____ Regular, ______ NAS, ____x___Consistent Carbohydrate, _______NPO _____Other  . NON FORMULARY Diet - Liquids: _x__Regular; ___Thickened ___ Consistency: ___ Nectar, ___Honey, ___ Pudding; ____ Fluid Restriction  . nortriptyline (PAMELOR) 10 MG capsule Take 10 mg by mouth at bedtime. For burning in feet  . potassium chloride SA (KLOR-CON) 20 MEQ tablet TAKE 1 TABLET BY MOUTH ON MONDAY, WEDNESDAY AND FRIDAY.  [name] psyllium (METAMUCIL) 58.6 % powder Take 1 packet by mouth daily.  . rosuvastatin (CRESTOR) 5 MG tablet TAKE (1) TABLET BY MOUTH ONCE DAILY AT BEDTIME.  [name] spironolactone (ALDACTONE) 25 MG tablet Take 0.5 tablets (12.5 mg total) by mouth at bedtime.  [name] warfarin (COUMADIN) 3 MG tablet Take 3 mg by mouth. Every Sun,Tues, Thurs.  . warfarin (COUMADIN) 6 MG tablet Take 6 mg by mouth every Monday, Wednesday, Friday, and Saturday at 6 PM.   . [DISCONTINUED] blood glucose meter kit and supplies Dispense based on patient and insurance preference. Use to test glucose three times daily.  (FOR ICD-10)  . [DISCONTINUED] insulin glargine (LANTUS SOLOSTAR) 100 UNIT/ML Solostar Pen INJECT 32 UNITS EVERY EVENING.  . [DISCONTINUED] metFORMIN (GLUCOPHAGE-XR) 500 MG 24 hr tablet TAKE 1 TABLET BY MOUTH ONCE DAILY WITH BREAKFAST.  . [DISCONTINUED] TRUEPLUS PEN NEEDLES 31G X 8 MM MISC USE WITH LANTUS SOLOSTAR EVERY EVENING   No facility-administered encounter medications on file as of 05/22/2020.     SIGNIFICANT DIAGNOSTIC EXAMS   PREVIOUS   05-11-20: chest x-ray:  1. Well-positioned right-sided PICC line. 2. Cardiomegaly with mild vascular congestion. 3. Hazy  ill-defined opacity overlying the right lower lung zone may represent atelectasis or infiltrate.  05-11-20: cardiac catheterization: 1. Severe two-vessel coronary  artery disease, including 80-90% proximal LAD stenosis involving ostium of D1 (70% D1 stenosis), and sequential 50% ostial and proximal LCx lesions followed by 80% mid/distal LCx stenosis (similar in appearance to 2017) 2. Moderate, diffuse RCA disease similar to 2017. 3. Moderately elevated left heart, right heart, and pulmonary artery pressures. 4. Prominent V-waves on PCWP tracing; query more severe mitral regurgitation than reported on echocardiogram in 02/2020. 5. Severely reduced cardiac output/index.   05-12-20: 2-d echo:  Left ventricular ejection fraction, by estimation, is 35 to 40%. The left ventricle has moderately decreased function. The left ventricle  demonstrates global hypokinesis. Left ventricular diastolic function could  not be evaluated.   NO NEW EXAMS.   LABS REVIEWED PREVIOUS   05-11-20: hgb a1c 7.8 05-12-20: wbc 7.4; hgb 13.8; hct 42.3; mcv 89.6 plt 188; glucose 185; bun 24; creat 1.46; k+ 3.7; na++ 137; ca 9.6  05-14-20: wbc 8.9; hgb 14.4; hct 43.3; mcv 88.5 plt 205; glucose 192; bun 29; creat 1.67; k+ 4.2; na++ 135; ca 9.6 INR 1.1 05-15-20: INR 1.3   TODAY  05-19-20: glucose 34; bun 45; creat 1.91; k+ 4.4' na++ 135; ca 9.4 INR 3.1 05-20-20: INR 3.2    Review of Systems  Constitutional: Negative for malaise/fatigue.  Respiratory: Negative for cough and shortness of breath.   Cardiovascular: Negative for chest pain, palpitations and leg swelling.  Gastrointestinal: Negative for abdominal pain, constipation and heartburn.  Musculoskeletal: Negative for back pain, joint pain and myalgias.  Skin: Negative.   Neurological: Negative for dizziness.  Psychiatric/Behavioral: The patient is not nervous/anxious.      Physical Exam Constitutional:      General: He is not in acute distress.    Appearance: He is  well-developed. He is not diaphoretic.  Neck:     Thyroid: No thyromegaly.  Cardiovascular:     Rate and Rhythm: Normal rate. Rhythm irregular.     Pulses: Normal pulses.     Heart sounds: Normal heart sounds.  Pulmonary:     Effort: Pulmonary effort is normal. No respiratory distress.     Breath sounds: Normal breath sounds.  Abdominal:     General: Bowel sounds are normal. There is no distension.     Palpations: Abdomen is soft.     Tenderness: There is no abdominal tenderness.  Musculoskeletal:     Cervical back: Neck supple.     Right lower leg: No edema.     Left lower leg: No edema.     Comments: Is able to move all extremities   Lymphadenopathy:     Cervical: No cervical adenopathy.  Skin:    General: Skin is warm and dry.     Comments: Bilateral lower extremities discolored   Neurological:     Mental Status: He is alert. Mental status is at baseline.     Comments: Is disoriented   Psychiatric:        Mood and Affect: Mood normal.     ASSESSMENT/ PLAN:  TODAY  1. Acute on chronic systolic congestive heart failure NYHA class 3: EF 35-40 % (05-12-20) is stable will zibeta 2.5 mg twice daily lasix 40 mg daily aldactone 12.5 mg daily digoxin 0.125 mg daily   2. Long standing persistent atrial fibrillation; is status post TAVR (transcatheter aortic valve replacement) is stable will continue zibeta 2.5 mg twice daily digoxin 0.125 mg daily for rate control is on long term coumadin therapy.   3. Long term current use of anticoagulation: INR today is 1.3 will  begin coumadin alternating 6 mg and 3 mg will check INR 05-25-20.     PREVIOUS   4. 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 Diabetic peripheral neuropathy associated with type 2 diabetes mellitus: is stable will continue pamelor 10 mg nightly   6. Type 2 diabetes mellitus with stage 3a chronic kidney disease with long term current use of insulin: is stable  hgb a1c 7.8 will continue metformin xr 500 mgm daily and lantus 32 units nightly   7. Hyperlipidemia associated with type 2 diabetes mellitus: is stable will continue crestor 5 mg daily   8. Coronary artery disease involving native coronary artery of native heart without angina: is stable will continue zibeta 2.5 mg twice daily asa 81 mg daily   9. Mild intermittent chronic asthma without complication: is stable will continue albuterol neb treatment every 6 hours as needed  10. Dementia without behavioral disturbance unspecified dementia type: is stable will continue to monitor   11. Hypokalemia: is stable k+ 4.2 will continue k+ 2 meq three times weekly   12. Chronic constipation: is stable will continue metamucil daily  13. Chronic insomnia: is stable will continue melatonin 10 mg nightly         MD is aware of resident's narcotic use and is in agreement with current plan of care. We will attempt to wean resident as appropriate.  Ok Edwards NP [facility]  Contact [phone] Monday through Friday 8am- 5pm  After hours call [phone]

## 2020-05-25 ENCOUNTER — Non-Acute Institutional Stay (SKILLED_NURSING_FACILITY): Payer: PPO | Admitting: Adult Health

## 2020-05-25 ENCOUNTER — Encounter: Payer: Self-pay | Admitting: Adult Health

## 2020-05-25 ENCOUNTER — Other Ambulatory Visit (HOSPITAL_COMMUNITY)
Admission: RE | Admit: 2020-05-25 | Discharge: 2020-05-25 | Disposition: A | Discharge status: Home / Self Care | Payer: PPO | Source: Skilled Nursing Facility | Attending: Adult Health | Admitting: Adult Health

## 2020-05-25 DIAGNOSIS — I4811 Longstanding persistent atrial fibrillation: Secondary | ICD-10-CM

## 2020-05-25 DIAGNOSIS — I4891 Unspecified atrial fibrillation: Secondary | ICD-10-CM | POA: Insufficient documentation

## 2020-05-25 DIAGNOSIS — Z7901 Long term (current) use of anticoagulants: Secondary | ICD-10-CM

## 2020-05-25 LAB — PROTIME-INR
INR: 3.1 — ABNORMAL HIGH (ref 0.8–1.2)
Prothrombin Time: 31.3 seconds — ABNORMAL HIGH (ref 11.4–15.2)

## 2020-05-25 LAB — DIGOXIN LEVEL: Digoxin Level: 0.7 ng/mL — ABNORMAL LOW (ref 0.8–2.0)

## 2020-05-25 NOTE — Progress Notes (Signed)
Location:    [facility] Room Number: 144/P Place of Service:  SNF (31)   CODE STATUS: Full Code  Allergies  Allergen Reactions  . Ambien [Zolpidem Tartrate] Other (See Comments)    Sleep walks  . Lipitor [Atorvastatin Calcium] Other (See Comments)    myalgias  . Ranitidine Other (See Comments)    Chest discomfort  . Simvastatin Other (See Comments)    Myalgias  . Xanax Xr [Alprazolam Er] Other (See Comments)    Tightness in chest  . Cholestatin Other (See Comments)    UNSPECIFIED REACTION   . Clopidogrel Bisulfate Rash    Chief Complaint  Patient presents with  . Anticoagulation    INR    HPI:  He is presently taking coumadin for his afib. His INR today is 3.1; and is alternating dosing 6 mg and 3 mg coumadin daily. There are no reports of abnormal bleeding. There are no reports of missed doses. No reports of palpitations; no shortness of breath present.   Past Medical History:  Diagnosis Date  . Acute on chronic systolic CHF (congestive heart failure) ([place]) 02/09/2017  . Acute respiratory failure with hypoxia ([place])   . AKI (acute kidney injury) ([place]) 08/20/2015  . Altered mental status 02/21/2017  . Aortic stenosis    a. mod-sev by echo 05/2016.  . Asthma   . Asthma exacerbation 08/20/2015  . Atrial fibrillation (HCC)    Paroxysmal; Echo in 2007-normal EF; mild LVH; LA enlargement; mild AS, minimal AI; neg. stress nuclear study in 2008   . BPH (benign prostatic hypertrophy) with urinary obstruction 09/27/2011   Transurethral resection of the prostate in 09/2011   . CAD (coronary artery disease), native coronary artery 06/28/2016  . Cardiogenic shock ([place])   . Carotid stenosis 04/25/2012  . Cerebrovascular disease 07/2010   TIA; carotid ultrasound in 07/2010-significant bilateral plaque without focal internal carotid artery stenosis; MRI -encephalomalacia left temporal and right temporal lobes; small inferior right cerebellar infarct; small vessel  disease  . Chronic atrial fibrillation (HCC)    Paroxysmal; Echocardiogram in 2007-normal EF; mild LVH; left atrial enlargement; mild stenosis and minimal AI; negative stress nuclear study in 2008  . Chronic systolic CHF (congestive heart failure) ([place])    a. dx 05/2016 - EF 35-40%, diffuse HK, mod-severe AS, mod gradient, severe AVA VTI likely due to decreased cardiac output in setting of systolic dysfsunction and significant mitral regurgitation, mild MR, mod-severe MR, severe LAE, mild-mod RV dilation, mild RAE, mild-mod TR, mod PASP 63mmHg.  . CKD (chronic kidney disease), stage II   . Cognitive dysfunction 05/29/2015   I believe that this is related to his age as well as previous stroke   . Congestive heart failure (CHF) ([place]) 02/09/2017  . Degenerative joint disease    feet and legs  . Diabetes mellitus    no insulin; A1c of 6.6 in 2005  . Dizziness    occurs daily,especially in am  . Elevated troponin   . Exertional dyspnea   . Gastroesophageal reflux disease   . Hepatic steatosis   . History of noncompliance with medical treatment   . Hyperlipidemia    adverse reactions to statins and niacin  . Hypertension    Borderline  . Irregular heartbeat   . Mitral regurgitation    a. mod-sev by echo 05/2016  . Peripheral vascular disease ([place])   . Renal insufficiency   . S/P TAVR (transcatheter aortic valve replacement) 07/19/2016   29 mm Edwards Sapien  3 transcatheter heart valve placed via left percutaneous transfemoral approach  . Senile purpura ([place]) 08/15/2018  . Temporal arteritis ([place])   . Tricuspid regurgitation    a. mild-mod TR by echo 05/2016  . VT (ventricular tachycardia) ([place])     Past Surgical History:  Procedure Laterality Date  . COLONOSCOPY  2002  . COLONOSCOPY  01/19/2012   Procedure: COLONOSCOPY;  Surgeon: Rogene Houston, MD;  [facility];  Service: Endoscopy;  Laterality: N/A;  1030  . LIPOMA EXCISION  1980  . ORIF ANKLE FRACTURE  2000   Right  .  PERIPHERAL VASCULAR CATHETERIZATION N/A 07/11/2016   Procedure: Carotid PTA/Stent Intervention;  Surgeon: Lorretta Harp, MD;  Location: [facility];  Service: Cardiovascular;  Laterality: N/A;  . PROSTATE SURGERY  12/2011  . RIGHT HEART CATH AND CORONARY ANGIOGRAPHY N/A 05/11/2020   Procedure: RIGHT HEART CATH AND CORONARY ANGIOGRAPHY;  Surgeon: Nelva Bush, MD;  Location: [facility];  Service: Cardiovascular;  Laterality: N/A;  . ROTATOR CUFF REPAIR     Right  . TEE WITHOUT CARDIOVERSION N/A 06/17/2016   Procedure: TRANSESOPHAGEAL ECHOCARDIOGRAM (TEE);  Surgeon: Jerline Pain, MD;  Location: [facility];  Service: Cardiovascular;  Laterality: N/A;  . TEE WITHOUT CARDIOVERSION N/A 07/19/2016   Procedure: TRANSESOPHAGEAL ECHOCARDIOGRAM (TEE);  Surgeon: Sherren Mocha, MD;  Location: [facility];  Service: Open Heart Surgery;  Laterality: N/A;  . TRANSCATHETER AORTIC VALVE REPLACEMENT, TRANSFEMORAL N/A 07/19/2016   Procedure: TRANSCATHETER AORTIC VALVE REPLACEMENT, TRANSFEMORAL;  Surgeon: Sherren Mocha, MD;  Location: [facility];  Service: Open Heart Surgery;  Laterality: N/A;  . TRANSURETHRAL RESECTION OF PROSTATE  09/2011  . URETHRAL STRICTURE DILATATION  1980s    Social History   Socioeconomic History  . Marital status: Widowed    Spouse name: Not on file  . Number of children: 1  . Years of education: Not on file  . Highest education level: Not on file  Occupational History  . Occupation: Retired  Tobacco Use  . Smoking status: Former Smoker    Packs/day: 1.00    Years: 20.00    Pack years: 20.00    Types: Cigarettes    Start date: 07/04/1950    Quit date: 04/25/1992    Years since quitting: 28.1  . Smokeless tobacco: Current User    Types: Chew  Substance and Sexual Activity  . Alcohol use: No    Alcohol/week: 0.0 standard drinks  . Drug use: No  . Sexual activity: Not Currently  Other Topics Concern  . Not on file  Social History Narrative  . Not on file    Social Determinants of Health   Financial Resource Strain:   . Difficulty of Paying Living Expenses:   Food Insecurity:   . Worried About Charity fundraiser in the Last Year:   . Arboriculturist in the Last Year:   Transportation Needs:   . Film/video editor (Medical):   [name] Lack of Transportation (Non-Medical):   Physical Activity:   . Days of Exercise per Week:   . Minutes of Exercise per Session:   Stress:   . Feeling of Stress :   Social Connections:   . Frequency of Communication with Friends and Family:   . Frequency of Social Gatherings with Friends and Family:   . Attends Religious Services:   . Active Member of Clubs or Organizations:   . Attends Archivist Meetings:   [name] Marital Status:  Intimate Partner Violence:   . Fear of Current or Ex-Partner:   . Emotionally Abused:   [name] Physically Abused:   . Sexually Abused:    Family History  Problem Relation Age of Onset  . Stroke Mother   . Diabetes Father   . Colon cancer Neg Hx       VITAL SIGNS BP 134/79   Pulse 76   Temp (!) 96.4 F (35.8 C) (Oral)   Resp 16   Ht 6\' 1"  (1.854 m)   Wt 222 lb 3.2 oz (100.8 kg)   SpO2 98%   BMI 29.32 kg/m   Outpatient Encounter Medications as of 05/25/2020  Medication Sig  . albuterol (PROVENTIL) (2.5 MG/3ML) 0.083% nebulizer solution Take 3 mLs (2.5 mg total) by nebulization every 6 (six) hours as needed for wheezing or shortness of breath.  [name] aspirin EC 81 MG EC tablet Take 1 tablet (81 mg total) by mouth daily. Swallow whole.  . bisoprolol (ZEBETA) 5 MG tablet Take 0.5 tablets (2.5 mg total) by mouth daily.  . cholecalciferol (VITAMIN D3) 25 MCG (1000 UNIT) tablet Take 1,000 Units by mouth in the morning and at bedtime.  . digoxin (LANOXIN) 0.125 MG tablet Take 1 tablet (0.125 mg total) by mouth daily.  . furosemide (LASIX) 20 MG tablet Take 20 mg by mouth daily.  . insulin glargine (LANTUS SOLOSTAR) 100 UNIT/ML Solostar Pen Inject 28 Units into the  skin every evening.  . Melatonin 10 MG TABS Take 10 mg by mouth at bedtime.  . NON FORMULARY Diet: _x____ Regular, ______ NAS, ____x___Consistent Carbohydrate, _______NPO _____Other  . NON FORMULARY Diet - Liquids: _x__Regular; ___Thickened ___ Consistency: ___ Nectar, ___Honey, ___ Pudding; ____ Fluid Restriction  . nortriptyline (PAMELOR) 10 MG capsule Take 10 mg by mouth at bedtime. For burning in feet  . potassium chloride SA (KLOR-CON) 20 MEQ tablet TAKE 1 TABLET BY MOUTH ON MONDAY, WEDNESDAY AND FRIDAY.  [name] psyllium (METAMUCIL) 58.6 % powder Take 1 packet by mouth daily.  . rosuvastatin (CRESTOR) 5 MG tablet TAKE (1) TABLET BY MOUTH ONCE DAILY AT BEDTIME.  [name] spironolactone (ALDACTONE) 25 MG tablet Take 0.5 tablets (12.5 mg total) by mouth at bedtime.  [name] warfarin (COUMADIN) 3 MG tablet Take 3 mg by mouth. Every Sun,Tues, Thurs.  . warfarin (COUMADIN) 6 MG tablet Take 6 mg by mouth every Monday, Wednesday, Friday, and Saturday at 6 PM.   . [DISCONTINUED] furosemide (LASIX) 40 MG tablet Take 1 tablet (40 mg total) by mouth daily.   No facility-administered encounter medications on file as of 05/25/2020.     SIGNIFICANT DIAGNOSTIC EXAMS   PREVIOUS   05-11-20: chest x-ray:  1. Well-positioned right-sided PICC line. 2. Cardiomegaly with mild vascular congestion. 3. Hazy ill-defined opacity overlying the right lower lung zone may represent atelectasis or infiltrate.  05-11-20: cardiac catheterization: 1. Severe two-vessel coronary artery disease, including 80-90% proximal LAD stenosis involving ostium of D1 (70% D1 stenosis), and sequential 50% ostial and proximal LCx lesions followed by 80% mid/distal LCx stenosis (similar in appearance to 2017) 2. Moderate, diffuse RCA disease similar to 2017. 3. Moderately elevated left heart, right heart, and pulmonary artery pressures. 4. Prominent V-waves on PCWP tracing; query more severe mitral regurgitation than reported on echocardiogram in  02/2020. 5. Severely reduced cardiac output/index.   05-12-20: 2-d echo:  Left ventricular ejection fraction, by estimation, is 35 to 40%. The left ventricle has moderately decreased function. The left ventricle  demonstrates global hypokinesis. Left ventricular diastolic  function could  not be evaluated.   NO NEW EXAMS.   LABS REVIEWED PREVIOUS   05-11-20: hgb a1c 7.8 05-12-20: wbc 7.4; hgb 13.8; hct 42.3; mcv 89.6 plt 188; glucose 185; bun 24; creat 1.46; k+ 3.7; na++ 137; ca 9.6  05-14-20: wbc 8.9; hgb 14.4; hct 43.3; mcv 88.5 plt 205; glucose 192; bun 29; creat 1.67; k+ 4.2; na++ 135; ca 9.6 INR 1.1 05-15-20: INR 1.3  05-19-20: glucose 34; bun 45; creat 1.91; k+ 4.4' na++ 135; ca 9.4 INR 3.1 05-20-20: INR 3.2   TODAY  05-25-20: INR 3.1 digoxin 0.7   Review of Systems  Constitutional: Negative for malaise/fatigue.  Respiratory: Negative for cough and shortness of breath.   Cardiovascular: Negative for chest pain, palpitations and leg swelling.  Gastrointestinal: Negative for abdominal pain, constipation and heartburn.  Musculoskeletal: Negative for back pain, joint pain and myalgias.  Skin: Negative.   Neurological: Negative for dizziness.  Psychiatric/Behavioral: The patient is not nervous/anxious.    Physical Exam Constitutional:      General: He is not in acute distress.    Appearance: He is well-developed. He is not diaphoretic.  Neck:     Thyroid: No thyromegaly.  Cardiovascular:     Rate and Rhythm: Normal rate. Rhythm irregular.     Pulses: Normal pulses.     Heart sounds: Normal heart sounds.  Pulmonary:     Effort: Pulmonary effort is normal. No respiratory distress.     Breath sounds: Normal breath sounds.  Abdominal:     General: Bowel sounds are normal. There is no distension.     Palpations: Abdomen is soft.     Tenderness: There is no abdominal tenderness.  Musculoskeletal:     Cervical back: Neck supple.     Right lower leg: No edema.     Left lower leg: No  edema.     Comments: Is able to move all extremities   Lymphadenopathy:     Cervical: No cervical adenopathy.  Skin:    General: Skin is warm and dry.  Neurological:     Mental Status: He is alert. Mental status is at baseline.  Psychiatric:        Mood and Affect: Mood normal.       ASSESSMENT/ PLAN:  TODAY  1. Long standing persistent atrial fibrillation 2. Current use of long term anticoagulation  For INR: 3.1 will hold 6 mg coumadin today then resume alternating dosing; will check INR 05-28-20.     MD is aware of resident's narcotic use and is in agreement with current plan of care. We will attempt to wean resident as appropriate.  Ok Edwards NP [facility]  Contact [phone] Monday through Friday 8am- 5pm  After hours call [phone]

## 2020-05-27 ENCOUNTER — Ambulatory Visit: Payer: PPO | Admitting: Physician Assistant

## 2020-05-28 ENCOUNTER — Encounter: Payer: Self-pay | Admitting: Adult Health

## 2020-05-28 ENCOUNTER — Non-Acute Institutional Stay (SKILLED_NURSING_FACILITY): Payer: PPO | Admitting: Adult Health

## 2020-05-28 ENCOUNTER — Other Ambulatory Visit: Payer: PPO

## 2020-05-28 ENCOUNTER — Telehealth: Payer: Self-pay | Admitting: Family Medicine

## 2020-05-28 DIAGNOSIS — N183 Chronic kidney disease, stage 3 unspecified: Secondary | ICD-10-CM

## 2020-05-28 DIAGNOSIS — E1122 Type 2 diabetes mellitus with diabetic chronic kidney disease: Secondary | ICD-10-CM | POA: Diagnosis not present

## 2020-05-28 DIAGNOSIS — F0390 Unspecified dementia without behavioral disturbance: Secondary | ICD-10-CM

## 2020-05-28 DIAGNOSIS — I5022 Chronic systolic (congestive) heart failure: Secondary | ICD-10-CM | POA: Diagnosis not present

## 2020-05-28 NOTE — Telephone Encounter (Signed)
Please call patient to have his schedule INR. Pt was due for INR 05/28/20. Pt can keep August appt also. Thank you

## 2020-05-28 NOTE — Telephone Encounter (Signed)
[name] come by to make appt for Mallon he had a appt on 07/20 and cancelled only time we was able to work him back in is 06/30/20. [name] is wanting to know when his INR needs to be checked since we are unable to get appt till then?   Pt call back [phone]

## 2020-05-28 NOTE — Progress Notes (Signed)
Location:    [facility] Room Number: 05/14/2020 Place of Service:  SNF (31)   CODE STATUS: Full Code  Allergies  Allergen Reactions  . Ambien [Zolpidem Tartrate] Other (See Comments)    Sleep walks  . Lipitor [Atorvastatin Calcium] Other (See Comments)    myalgias  . Ranitidine Other (See Comments)    Chest discomfort  . Simvastatin Other (See Comments)    Myalgias  . Xanax Xr [Alprazolam Er] Other (See Comments)    Tightness in chest  . Cholestatin Other (See Comments)    UNSPECIFIED REACTION   . Clopidogrel Bisulfate Rash    Chief Complaint  Patient presents with  . Acute Visit    Care Plan Meeting    HPI:  We have come together for his care plan meeting. Family present. BIMS 12/15 mood 8/30. He has had one fall without injury. He has had increased confusion since his admission to this facility. He is able to ambulate 100 feet and is working on steps. His weight is stable. He requires extensive assist with adls. Is occasionally incontinent of bladder and bowel. He has a walker at home. His goal is to return back home. He does have 24 hour care at home. He continues to be followed for his chronic illnesses including:Chronic systolic heart failure dementia without behavioral disturbance unspecified dementia type  CKD stage 3 due to type 2 diabetes mellitus    Past Medical History:  Diagnosis Date  . Acute on chronic systolic CHF (congestive heart failure) ([place]) 02/09/2017  . Acute respiratory failure with hypoxia ([place])   . AKI (acute kidney injury) ([place]) 08/20/2015  . Altered mental status 02/21/2017  . Aortic stenosis    a. mod-sev by echo 05/2016.  . Asthma   . Asthma exacerbation 08/20/2015  . Atrial fibrillation (HCC)    Paroxysmal; Echo in 2007-normal EF; mild LVH; LA enlargement; mild AS, minimal AI; neg. stress nuclear study in 2008   . BPH (benign prostatic hypertrophy) with urinary obstruction 09/27/2011   Transurethral resection of the  prostate in 09/2011   . CAD (coronary artery disease), native coronary artery 06/28/2016  . Cardiogenic shock ([place])   . Carotid stenosis 04/25/2012  . Cerebrovascular disease 07/2010   TIA; carotid ultrasound in 07/2010-significant bilateral plaque without focal internal carotid artery stenosis; MRI -encephalomalacia left temporal and right temporal lobes; small inferior right cerebellar infarct; small vessel disease  . Chronic atrial fibrillation (HCC)    Paroxysmal; Echocardiogram in 2007-normal EF; mild LVH; left atrial enlargement; mild stenosis and minimal AI; negative stress nuclear study in 2008  . Chronic systolic CHF (congestive heart failure) ([place])    a. dx 05/2016 - EF 35-40%, diffuse HK, mod-severe AS, mod gradient, severe AVA VTI likely due to decreased cardiac output in setting of systolic dysfsunction and significant mitral regurgitation, mild MR, mod-severe MR, severe LAE, mild-mod RV dilation, mild RAE, mild-mod TR, mod PASP 107mmHg.  . CKD (chronic kidney disease), stage II   . Cognitive dysfunction 05/29/2015   I believe that this is related to his age as well as previous stroke   . Congestive heart failure (CHF) ([place]) 02/09/2017  . Degenerative joint disease    feet and legs  . Diabetes mellitus    no insulin; A1c of 6.6 in 2005  . Dizziness    occurs daily,especially in am  . Elevated troponin   . Exertional dyspnea   . Gastroesophageal reflux disease   . Hepatic steatosis   .  History of noncompliance with medical treatment   . Hyperlipidemia    adverse reactions to statins and niacin  . Hypertension    Borderline  . Irregular heartbeat   . Mitral regurgitation    a. mod-sev by echo 05/2016  . Peripheral vascular disease ([place])   . Renal insufficiency   . S/P TAVR (transcatheter aortic valve replacement) 07/19/2016   29 mm Edwards Sapien 3 transcatheter heart valve placed via left percutaneous transfemoral approach  . Senile purpura ([place]) 08/15/2018  . Temporal arteritis  ([place])   . Tricuspid regurgitation    a. mild-mod TR by echo 05/2016  . VT (ventricular tachycardia) ([place])     Past Surgical History:  Procedure Laterality Date  . COLONOSCOPY  2002  . COLONOSCOPY  01/19/2012   Procedure: COLONOSCOPY;  Surgeon: Rogene Houston, MD;  [facility];  Service: Endoscopy;  Laterality: N/A;  1030  . LIPOMA EXCISION  1980  . ORIF ANKLE FRACTURE  2000   Right  . PERIPHERAL VASCULAR CATHETERIZATION N/A 07/11/2016   Procedure: Carotid PTA/Stent Intervention;  Surgeon: Lorretta Harp, MD;  Location: [facility];  Service: Cardiovascular;  Laterality: N/A;  . PROSTATE SURGERY  12/2011  . RIGHT HEART CATH AND CORONARY ANGIOGRAPHY N/A 05/11/2020   Procedure: RIGHT HEART CATH AND CORONARY ANGIOGRAPHY;  Surgeon: Nelva Bush, MD;  Location: [facility];  Service: Cardiovascular;  Laterality: N/A;  . ROTATOR CUFF REPAIR     Right  . TEE WITHOUT CARDIOVERSION N/A 06/17/2016   Procedure: TRANSESOPHAGEAL ECHOCARDIOGRAM (TEE);  Surgeon: Jerline Pain, MD;  Location: [facility];  Service: Cardiovascular;  Laterality: N/A;  . TEE WITHOUT CARDIOVERSION N/A 07/19/2016   Procedure: TRANSESOPHAGEAL ECHOCARDIOGRAM (TEE);  Surgeon: Sherren Mocha, MD;  Location: [facility];  Service: Open Heart Surgery;  Laterality: N/A;  . TRANSCATHETER AORTIC VALVE REPLACEMENT, TRANSFEMORAL N/A 07/19/2016   Procedure: TRANSCATHETER AORTIC VALVE REPLACEMENT, TRANSFEMORAL;  Surgeon: Sherren Mocha, MD;  Location: [facility];  Service: Open Heart Surgery;  Laterality: N/A;  . TRANSURETHRAL RESECTION OF PROSTATE  09/2011  . URETHRAL STRICTURE DILATATION  1980s    Social History   Socioeconomic History  . Marital status: Widowed    Spouse name: Not on file  . Number of children: 1  . Years of education: Not on file  . Highest education level: Not on file  Occupational History  . Occupation: Retired  Tobacco Use  . Smoking status: Former Smoker    Packs/day: 1.00    Years: 20.00     Pack years: 20.00    Types: Cigarettes    Start date: 07/04/1950    Quit date: 04/25/1992    Years since quitting: 28.1  . Smokeless tobacco: Current User    Types: Chew  Substance and Sexual Activity  . Alcohol use: No    Alcohol/week: 0.0 standard drinks  . Drug use: No  . Sexual activity: Not Currently  Other Topics Concern  . Not on file  Social History Narrative  . Not on file   Social Determinants of Health   Financial Resource Strain:   . Difficulty of Paying Living Expenses:   Food Insecurity:   . Worried About Charity fundraiser in the Last Year:   . Arboriculturist in the Last Year:   Transportation Needs:   . Film/video editor (Medical):   [name] Lack of Transportation (Non-Medical):   Physical Activity:   . Days of Exercise per Week:   .  Minutes of Exercise per Session:   Stress:   . Feeling of Stress :   Social Connections:   . Frequency of Communication with Friends and Family:   . Frequency of Social Gatherings with Friends and Family:   . Attends Religious Services:   . Active Member of Clubs or Organizations:   . Attends Archivist Meetings:   [name] Marital Status:   Intimate Partner Violence:   . Fear of Current or Ex-Partner:   . Emotionally Abused:   [name] Physically Abused:   . Sexually Abused:    Family History  Problem Relation Age of Onset  . Stroke Mother   . Diabetes Father   . Colon cancer Neg Hx       VITAL SIGNS BP 109/63   Pulse 74   Temp (!) 97.5 F (36.4 C) (Oral)   Resp 16   Ht 6\' 1"  (1.854 m)   Wt 222 lb 3.2 oz (100.8 kg)   SpO2 98%   BMI 29.32 kg/m   Outpatient Encounter Medications as of 05/28/2020  Medication Sig  . albuterol (PROVENTIL) (2.5 MG/3ML) 0.083% nebulizer solution Take 3 mLs (2.5 mg total) by nebulization every 6 (six) hours as needed for wheezing or shortness of breath.  [name] aspirin EC 81 MG EC tablet Take 1 tablet (81 mg total) by mouth daily. Swallow whole.  . bisoprolol (ZEBETA) 5 MG tablet  Take 0.5 tablets (2.5 mg total) by mouth daily.  . cholecalciferol (VITAMIN D3) 25 MCG (1000 UNIT) tablet Take 1,000 Units by mouth in the morning and at bedtime.  . digoxin (LANOXIN) 0.125 MG tablet Take 1 tablet (0.125 mg total) by mouth daily.  . furosemide (LASIX) 20 MG tablet Take 20 mg by mouth daily.  . insulin glargine (LANTUS SOLOSTAR) 100 UNIT/ML Solostar Pen Inject 28 Units into the skin every evening.  . Melatonin 10 MG TABS Take 10 mg by mouth at bedtime.  . NON FORMULARY Diet: _x____ Regular, ______ NAS, ____x___Consistent Carbohydrate, _______NPO _____Other  . NON FORMULARY Diet - Liquids: _x__Regular; ___Thickened ___ Consistency: ___ Nectar, ___Honey, ___ Pudding; ____ Fluid Restriction  . nortriptyline (PAMELOR) 10 MG capsule Take 10 mg by mouth at bedtime. For burning in feet  . potassium chloride SA (KLOR-CON) 20 MEQ tablet TAKE 1 TABLET BY MOUTH ON MONDAY, WEDNESDAY AND FRIDAY.  [name] psyllium (METAMUCIL) 58.6 % powder Take 1 packet by mouth daily.  . rosuvastatin (CRESTOR) 5 MG tablet TAKE (1) TABLET BY MOUTH ONCE DAILY AT BEDTIME.  [name] spironolactone (ALDACTONE) 25 MG tablet Take 0.5 tablets (12.5 mg total) by mouth at bedtime.  [name] warfarin (COUMADIN) 3 MG tablet Take 3 mg by mouth. Every Sun,Tues, Thurs.  . warfarin (COUMADIN) 6 MG tablet Take 6 mg by mouth every Monday, Wednesday, Friday, and Saturday at 6 PM.    No facility-administered encounter medications on file as of 05/28/2020.     SIGNIFICANT DIAGNOSTIC EXAMS   PREVIOUS   05-11-20: chest x-ray:  1. Well-positioned right-sided PICC line. 2. Cardiomegaly with mild vascular congestion. 3. Hazy ill-defined opacity overlying the right lower lung zone may represent atelectasis or infiltrate.  05-11-20: cardiac catheterization: 1. Severe two-vessel coronary artery disease, including 80-90% proximal LAD stenosis involving ostium of D1 (70% D1 stenosis), and sequential 50% ostial and proximal LCx lesions followed by  80% mid/distal LCx stenosis (similar in appearance to 2017) 2. Moderate, diffuse RCA disease similar to 2017. 3. Moderately elevated left heart, right heart, and pulmonary artery pressures. 4. Prominent  V-waves on PCWP tracing; query more severe mitral regurgitation than reported on echocardiogram in 02/2020. 5. Severely reduced cardiac output/index.   05-12-20: 2-d echo:  Left ventricular ejection fraction, by estimation, is 35 to 40%. The left ventricle has moderately decreased function. The left ventricle  demonstrates global hypokinesis. Left ventricular diastolic function could  not be evaluated.   NO NEW EXAMS.   LABS REVIEWED PREVIOUS   05-11-20: hgb a1c 7.8 05-12-20: wbc 7.4; hgb 13.8; hct 42.3; mcv 89.6 plt 188; glucose 185; bun 24; creat 1.46; k+ 3.7; na++ 137; ca 9.6  05-14-20: wbc 8.9; hgb 14.4; hct 43.3; mcv 88.5 plt 205; glucose 192; bun 29; creat 1.67; k+ 4.2; na++ 135; ca 9.6 INR 1.1 05-15-20: INR 1.3  05-19-20: glucose 34; bun 45; creat 1.91; k+ 4.4' na++ 135; ca 9.4 INR 3.1 05-20-20: INR 3.2  05-25-20: INR 3.1 digoxin 0.7   NO NEW LABS.   Review of Systems  Constitutional: Negative for malaise/fatigue.  Respiratory: Negative for cough and shortness of breath.   Cardiovascular: Negative for chest pain, palpitations and leg swelling.  Gastrointestinal: Negative for abdominal pain, constipation and heartburn.  Musculoskeletal: Negative for back pain, joint pain and myalgias.  Skin: Negative.   Neurological: Negative for dizziness.  Psychiatric/Behavioral: The patient is not nervous/anxious.     Physical Exam Constitutional:      General: He is not in acute distress.    Appearance: He is well-developed. He is not diaphoretic.  Neck:     Thyroid: No thyromegaly.  Cardiovascular:     Rate and Rhythm: Normal rate. Rhythm irregular.     Pulses: Normal pulses.     Heart sounds: Normal heart sounds.  Pulmonary:     Effort: Pulmonary effort is normal. No respiratory distress.       Breath sounds: Normal breath sounds.  Abdominal:     General: Bowel sounds are normal. There is no distension.     Palpations: Abdomen is soft.     Tenderness: There is no abdominal tenderness.  Musculoskeletal:     Cervical back: Neck supple.     Right lower leg: No edema.     Left lower leg: No edema.     Comments: Is able to move all extremities   Lymphadenopathy:     Cervical: No cervical adenopathy.  Skin:    General: Skin is warm and dry.  Neurological:     Mental Status: He is alert. Mental status is at baseline.  Psychiatric:        Mood and Affect: Mood normal.       ASSESSMENT/ PLAN:  TODAY  1.  Chronic systolic heart failure 2.  dementia without behavioral disturbance unspecified dementia type 3.  CKD stage 3 due to type 2 diabetes mellitus  Will continue therapy as directed Will continue current medications Will continue to monitor his status.  His goal is to return back home has all needed dme.     MD is aware of resident's narcotic use and is in agreement with current plan of care. We will attempt to wean resident as appropriate.  Ok Edwards NP [facility]  Contact [phone] Monday through Friday 8am- 5pm  After hours call [phone]

## 2020-05-28 NOTE — Telephone Encounter (Signed)
Pt needs nurse visit scheduled for INR. Pt was due for INR 05/28/20. Please contact patient to have this set up. Thank you. Keep August appt. Thanks!!

## 2020-05-29 ENCOUNTER — Encounter: Payer: Self-pay | Admitting: Adult Health

## 2020-05-29 ENCOUNTER — Non-Acute Institutional Stay (SKILLED_NURSING_FACILITY): Payer: PPO | Admitting: Adult Health

## 2020-05-29 ENCOUNTER — Encounter (HOSPITAL_COMMUNITY)
Admission: RE | Admit: 2020-05-29 | Discharge: 2020-05-29 | Disposition: A | Discharge status: Home / Self Care | Payer: PPO | Source: Skilled Nursing Facility | Attending: Adult Health | Admitting: Adult Health

## 2020-05-29 ENCOUNTER — Other Ambulatory Visit: Payer: Self-pay | Admitting: Adult Health

## 2020-05-29 DIAGNOSIS — E1122 Type 2 diabetes mellitus with diabetic chronic kidney disease: Secondary | ICD-10-CM

## 2020-05-29 DIAGNOSIS — I5023 Acute on chronic systolic (congestive) heart failure: Secondary | ICD-10-CM | POA: Diagnosis not present

## 2020-05-29 DIAGNOSIS — I482 Chronic atrial fibrillation, unspecified: Secondary | ICD-10-CM | POA: Insufficient documentation

## 2020-05-29 DIAGNOSIS — E1121 Type 2 diabetes mellitus with diabetic nephropathy: Secondary | ICD-10-CM

## 2020-05-29 DIAGNOSIS — Z794 Long term (current) use of insulin: Secondary | ICD-10-CM

## 2020-05-29 DIAGNOSIS — N1832 Chronic kidney disease, stage 3b: Secondary | ICD-10-CM

## 2020-05-29 DIAGNOSIS — I4811 Longstanding persistent atrial fibrillation: Secondary | ICD-10-CM

## 2020-05-29 DIAGNOSIS — F0390 Unspecified dementia without behavioral disturbance: Secondary | ICD-10-CM

## 2020-05-29 LAB — PROTIME-INR
INR: 1.9 — ABNORMAL HIGH (ref 0.8–1.2)
Prothrombin Time: 21.1 seconds — ABNORMAL HIGH (ref 11.4–15.2)

## 2020-05-29 MED ORDER — POTASSIUM CHLORIDE CRYS ER 20 MEQ PO TBCR: EXTENDED_RELEASE_TABLET | ORAL | 0 refills | Status: DC

## 2020-05-29 MED ORDER — WARFARIN SODIUM 3 MG PO TABS: 3.0000 mg | ORAL_TABLET | Freq: Every day | ORAL | 0 refills | Status: DC

## 2020-05-29 MED ORDER — WARFARIN SODIUM 6 MG PO TABS: 6.0000 mg | ORAL_TABLET | ORAL | 0 refills | Status: DC

## 2020-05-29 MED ORDER — FUROSEMIDE 20 MG PO TABS: 20.0000 mg | ORAL_TABLET | Freq: Every day | ORAL | 0 refills | Status: DC

## 2020-05-29 MED ORDER — LANTUS SOLOSTAR 100 UNIT/ML ~~LOC~~ SOPN: 28.0000 [IU] | PEN_INJECTOR | Freq: Every evening | SUBCUTANEOUS | 0 refills | Status: DC

## 2020-05-29 MED ORDER — DIGOXIN 125 MCG PO TABS: 0.1250 mg | ORAL_TABLET | Freq: Every day | ORAL | 0 refills | Status: DC

## 2020-05-29 MED ORDER — SPIRONOLACTONE 25 MG PO TABS: 12.5000 mg | ORAL_TABLET | Freq: Every day | ORAL | 0 refills | Status: DC

## 2020-05-29 MED ORDER — NORTRIPTYLINE HCL 10 MG PO CAPS: 10.0000 mg | ORAL_CAPSULE | Freq: Every day | ORAL | 0 refills | Status: DC

## 2020-05-29 MED ORDER — ROSUVASTATIN CALCIUM 5 MG PO TABS: ORAL_TABLET | ORAL | 0 refills | Status: DC

## 2020-05-29 MED ORDER — ALBUTEROL SULFATE (2.5 MG/3ML) 0.083% IN NEBU: 2.5000 mg | INHALATION_SOLUTION | Freq: Four times a day (QID) | RESPIRATORY_TRACT | 0 refills | Status: DC | PRN

## 2020-05-29 MED ORDER — BISOPROLOL FUMARATE 5 MG PO TABS: 2.5000 mg | ORAL_TABLET | Freq: Every day | ORAL | 0 refills | Status: DC

## 2020-05-29 NOTE — Telephone Encounter (Signed)
Called to schedule INR mailbox is full will try to recall

## 2020-05-29 NOTE — Progress Notes (Signed)
Location:    [facility] Room Number: 144/P Place of Service:  SNF (31)    CODE STATUS: Full Code  Allergies  Allergen Reactions  . Ambien [Zolpidem Tartrate] Other (See Comments)    Sleep walks  . Lipitor [Atorvastatin Calcium] Other (See Comments)    myalgias  . Ranitidine Other (See Comments)    Chest discomfort  . Simvastatin Other (See Comments)    Myalgias  . Xanax Xr [Alprazolam Er] Other (See Comments)    Tightness in chest  . Cholestatin Other (See Comments)    UNSPECIFIED REACTION   . Clopidogrel Bisulfate Rash    Chief Complaint  Patient presents with  . Discharge Note    Discharge Visit    HPI:  He is being discharged to home with home health for pt/ot. He will not need dme. He has 24 hour care at home. He will need his prescriptions written and will need to follow up with his medical provider. He was hospitalized for acute on chronic chf. He was admitted to this facility for short term rehab. He has participated in therapy . He has had issues with some confusion since his admission. He is ready to complete therapy on a home health basis.    Past Medical History:  Diagnosis Date  . Acute on chronic systolic CHF (congestive heart failure) ([place]) 02/09/2017  . Acute respiratory failure with hypoxia ([place])   . AKI (acute kidney injury) ([place]) 08/20/2015  . Altered mental status 02/21/2017  . Aortic stenosis    a. mod-sev by echo 05/2016.  . Asthma   . Asthma exacerbation 08/20/2015  . Atrial fibrillation (HCC)    Paroxysmal; Echo in 2007-normal EF; mild LVH; LA enlargement; mild AS, minimal AI; neg. stress nuclear study in 2008   . BPH (benign prostatic hypertrophy) with urinary obstruction 09/27/2011   Transurethral resection of the prostate in 09/2011   . CAD (coronary artery disease), native coronary artery 06/28/2016  . Cardiogenic shock ([place])   . Carotid stenosis 04/25/2012  . Cerebrovascular disease 07/2010   TIA; carotid ultrasound in  07/2010-significant bilateral plaque without focal internal carotid artery stenosis; MRI -encephalomalacia left temporal and right temporal lobes; small inferior right cerebellar infarct; small vessel disease  . Chronic atrial fibrillation (HCC)    Paroxysmal; Echocardiogram in 2007-normal EF; mild LVH; left atrial enlargement; mild stenosis and minimal AI; negative stress nuclear study in 2008  . Chronic systolic CHF (congestive heart failure) ([place])    a. dx 05/2016 - EF 35-40%, diffuse HK, mod-severe AS, mod gradient, severe AVA VTI likely due to decreased cardiac output in setting of systolic dysfsunction and significant mitral regurgitation, mild MR, mod-severe MR, severe LAE, mild-mod RV dilation, mild RAE, mild-mod TR, mod PASP 80mmHg.  . CKD (chronic kidney disease), stage II   . Cognitive dysfunction 05/29/2015   I believe that this is related to his age as well as previous stroke   . Congestive heart failure (CHF) ([place]) 02/09/2017  . Degenerative joint disease    feet and legs  . Diabetes mellitus    no insulin; A1c of 6.6 in 2005  . Dizziness    occurs daily,especially in am  . Elevated troponin   . Exertional dyspnea   . Gastroesophageal reflux disease   . Hepatic steatosis   . History of noncompliance with medical treatment   . Hyperlipidemia    adverse reactions to statins and niacin  . Hypertension    Borderline  . Irregular  heartbeat   . Mitral regurgitation    a. mod-sev by echo 05/2016  . Peripheral vascular disease ([place])   . Renal insufficiency   . S/P TAVR (transcatheter aortic valve replacement) 07/19/2016   29 mm Edwards Sapien 3 transcatheter heart valve placed via left percutaneous transfemoral approach  . Senile purpura ([place]) 08/15/2018  . Temporal arteritis ([place])   . Tricuspid regurgitation    a. mild-mod TR by echo 05/2016  . VT (ventricular tachycardia) ([place])     Past Surgical History:  Procedure Laterality Date  . COLONOSCOPY  2002  . COLONOSCOPY  01/19/2012     Procedure: COLONOSCOPY;  Surgeon: Rogene Houston, MD;  [facility];  Service: Endoscopy;  Laterality: N/A;  1030  . LIPOMA EXCISION  1980  . ORIF ANKLE FRACTURE  2000   Right  . PERIPHERAL VASCULAR CATHETERIZATION N/A 07/11/2016   Procedure: Carotid PTA/Stent Intervention;  Surgeon: Lorretta Harp, MD;  Location: [facility];  Service: Cardiovascular;  Laterality: N/A;  . PROSTATE SURGERY  12/2011  . RIGHT HEART CATH AND CORONARY ANGIOGRAPHY N/A 05/11/2020   Procedure: RIGHT HEART CATH AND CORONARY ANGIOGRAPHY;  Surgeon: Nelva Bush, MD;  Location: [facility];  Service: Cardiovascular;  Laterality: N/A;  . ROTATOR CUFF REPAIR     Right  . TEE WITHOUT CARDIOVERSION N/A 06/17/2016   Procedure: TRANSESOPHAGEAL ECHOCARDIOGRAM (TEE);  Surgeon: Jerline Pain, MD;  Location: [facility];  Service: Cardiovascular;  Laterality: N/A;  . TEE WITHOUT CARDIOVERSION N/A 07/19/2016   Procedure: TRANSESOPHAGEAL ECHOCARDIOGRAM (TEE);  Surgeon: Sherren Mocha, MD;  Location: [facility];  Service: Open Heart Surgery;  Laterality: N/A;  . TRANSCATHETER AORTIC VALVE REPLACEMENT, TRANSFEMORAL N/A 07/19/2016   Procedure: TRANSCATHETER AORTIC VALVE REPLACEMENT, TRANSFEMORAL;  Surgeon: Sherren Mocha, MD;  Location: [facility];  Service: Open Heart Surgery;  Laterality: N/A;  . TRANSURETHRAL RESECTION OF PROSTATE  09/2011  . URETHRAL STRICTURE DILATATION  1980s    Social History   Socioeconomic History  . Marital status: Widowed    Spouse name: Not on file  . Number of children: 1  . Years of education: Not on file  . Highest education level: Not on file  Occupational History  . Occupation: Retired  Tobacco Use  . Smoking status: Former Smoker    Packs/day: 1.00    Years: 20.00    Pack years: 20.00    Types: Cigarettes    Start date: 07/04/1950    Quit date: 04/25/1992    Years since quitting: 28.1  . Smokeless tobacco: Current User    Types: Chew  Substance and Sexual Activity  .  Alcohol use: No    Alcohol/week: 0.0 standard drinks  . Drug use: No  . Sexual activity: Not Currently  Other Topics Concern  . Not on file  Social History Narrative  . Not on file   Social Determinants of Health   Financial Resource Strain:   . Difficulty of Paying Living Expenses:   Food Insecurity:   . Worried About Charity fundraiser in the Last Year:   . Arboriculturist in the Last Year:   Transportation Needs:   . Film/video editor (Medical):   [name] Lack of Transportation (Non-Medical):   Physical Activity:   . Days of Exercise per Week:   . Minutes of Exercise per Session:   Stress:   . Feeling of Stress :   Social Connections:   . Frequency of Communication with Friends  and Family:   . Frequency of Social Gatherings with Friends and Family:   . Attends Religious Services:   . Active Member of Clubs or Organizations:   . Attends Archivist Meetings:   [name] Marital Status:   Intimate Partner Violence:   . Fear of Current or Ex-Partner:   . Emotionally Abused:   [name] Physically Abused:   . Sexually Abused:    Family History  Problem Relation Age of Onset  . Stroke Mother   . Diabetes Father   . Colon cancer Neg Hx     VITAL SIGNS BP 109/63   Pulse 72   Temp (!) 97.5 F (36.4 C) (Oral)   Resp 16   Ht 6\' 1"  (1.854 m)   Wt 222 lb 3.2 oz (100.8 kg)   SpO2 98%   BMI 29.32 kg/m   Patient's Medications  New Prescriptions   No medications on file  Previous Medications   ALBUTEROL (PROVENTIL) (2.5 MG/3ML) 0.083% NEBULIZER SOLUTION    Take 3 mLs (2.5 mg total) by nebulization every 6 (six) hours as needed for wheezing or shortness of breath.   ASPIRIN EC 81 MG EC TABLET    Take 1 tablet (81 mg total) by mouth daily. Swallow whole.   BISOPROLOL (ZEBETA) 5 MG TABLET    Take 0.5 tablets (2.5 mg total) by mouth daily.   CHOLECALCIFEROL (VITAMIN D3) 25 MCG (1000 UNIT) TABLET    Take 1,000 Units by mouth in the morning and at bedtime.   DIGOXIN (LANOXIN)  0.125 MG TABLET    Take 1 tablet (0.125 mg total) by mouth daily.   FUROSEMIDE (LASIX) 20 MG TABLET    Take 20 mg by mouth daily.   INSULIN GLARGINE (LANTUS SOLOSTAR) 100 UNIT/ML SOLOSTAR PEN    Inject 28 Units into the skin every evening.   MELATONIN 10 MG TABS    Take 10 mg by mouth at bedtime.   NON FORMULARY    Diet: _x____ Regular, ______ NAS, ____x___Consistent Carbohydrate, _______NPO _____Other   NON FORMULARY    Diet - Liquids: _x__Regular; ___Thickened ___ Consistency: ___ Nectar, ___Honey, ___ Pudding; ____ Fluid Restriction   NORTRIPTYLINE (PAMELOR) 10 MG CAPSULE    Take 10 mg by mouth at bedtime. For burning in feet   POTASSIUM CHLORIDE SA (KLOR-CON) 20 MEQ TABLET    TAKE 1 TABLET BY MOUTH ON MONDAY, WEDNESDAY AND FRIDAY.   PSYLLIUM (METAMUCIL) 58.6 % POWDER    Take 1 packet by mouth daily.   ROSUVASTATIN (CRESTOR) 5 MG TABLET    TAKE (1) TABLET BY MOUTH ONCE DAILY AT BEDTIME.   SPIRONOLACTONE (ALDACTONE) 25 MG TABLET    Take 0.5 tablets (12.5 mg total) by mouth at bedtime.   WARFARIN (COUMADIN) 3 MG TABLET    Take 3 mg by mouth. Every Sun,Tues, Thurs.   WARFARIN (COUMADIN) 6 MG TABLET    Take 6 mg by mouth every Monday, Wednesday, Friday, and Saturday at 6 PM.   Modified Medications   No medications on file  Discontinued Medications   No medications on file     SIGNIFICANT DIAGNOSTIC EXAMS   PREVIOUS   05-11-20: chest x-ray:  1. Well-positioned right-sided PICC line. 2. Cardiomegaly with mild vascular congestion. 3. Hazy ill-defined opacity overlying the right lower lung zone may represent atelectasis or infiltrate.  05-11-20: cardiac catheterization: 1. Severe two-vessel coronary artery disease, including 80-90% proximal LAD stenosis involving ostium of D1 (70% D1 stenosis), and sequential 50% ostial and proximal LCx  lesions followed by 80% mid/distal LCx stenosis (similar in appearance to 2017) 2. Moderate, diffuse RCA disease similar to 2017. 3. Moderately elevated  left heart, right heart, and pulmonary artery pressures. 4. Prominent V-waves on PCWP tracing; query more severe mitral regurgitation than reported on echocardiogram in 02/2020. 5. Severely reduced cardiac output/index.   05-12-20: 2-d echo:  Left ventricular ejection fraction, by estimation, is 35 to 40%. The left ventricle has moderately decreased function. The left ventricle  demonstrates global hypokinesis. Left ventricular diastolic function could  not be evaluated.   NO NEW EXAMS.   LABS REVIEWED PREVIOUS   05-11-20: hgb a1c 7.8 05-12-20: wbc 7.4; hgb 13.8; hct 42.3; mcv 89.6 plt 188; glucose 185; bun 24; creat 1.46; k+ 3.7; na++ 137; ca 9.6  05-14-20: wbc 8.9; hgb 14.4; hct 43.3; mcv 88.5 plt 205; glucose 192; bun 29; creat 1.67; k+ 4.2; na++ 135; ca 9.6 INR 1.1 05-15-20: INR 1.3  05-19-20: glucose 34; bun 45; creat 1.91; k+ 4.4' na++ 135; ca 9.4 INR 3.1 05-20-20: INR 3.2  05-25-20: INR 3.1 digoxin 0.7   TODAY  05-29-20: INR 1.9   Review of Systems  Constitutional: Negative for malaise/fatigue.  Respiratory: Negative for cough and shortness of breath.   Cardiovascular: Negative for chest pain, palpitations and leg swelling.  Gastrointestinal: Negative for abdominal pain, constipation and heartburn.  Musculoskeletal: Negative for back pain, joint pain and myalgias.  Skin: Negative.   Neurological: Negative for dizziness.  Psychiatric/Behavioral: The patient is not nervous/anxious.     Physical Exam Constitutional:      General: He is not in acute distress.    Appearance: He is well-developed. He is not diaphoretic.  Neck:     Thyroid: No thyromegaly.  Cardiovascular:     Rate and Rhythm: Normal rate. Rhythm irregular.     Pulses: Normal pulses.     Heart sounds: Normal heart sounds.  Pulmonary:     Effort: Pulmonary effort is normal. No respiratory distress.     Breath sounds: Normal breath sounds.  Abdominal:     General: Bowel sounds are normal. There is no distension.      Palpations: Abdomen is soft.     Tenderness: There is no abdominal tenderness.  Musculoskeletal:     Cervical back: Neck supple.     Right lower leg: No edema.     Left lower leg: No edema.     Comments: Is able to move all extremities   Lymphadenopathy:     Cervical: No cervical adenopathy.  Skin:    General: Skin is warm and dry.  Neurological:     Mental Status: He is alert. Mental status is at baseline.  Psychiatric:        Mood and Affect: Mood normal.     ASSESSMENT/ PLAN:  Patient is being discharged with the following home health services:  Pt/ot to evaluate and treat as indicated for gait balance strength adl training.   Patient is being discharged with the following durable medical equipment:  None needed   Patient has been advised to f/u with their PCP in 1-2 weeks to bring them up to date on their rehab stay.  Social services at facility was responsible for arranging this appointment.  Pt was provided with a 30 day supply of prescriptions for medications and refills must be obtained from their PCP.  For controlled substances, a more limited supply may be provided adequate until PCP appointment only.  A 30 day supply of his prescription  medications have been [facility]  Time spent with patient: 35 minutes: dme; medications; home health.    Ok Edwards NP [facility]  Contact [phone] Monday through Friday 8am- 5pm  After hours call [phone]

## 2020-05-29 NOTE — Progress Notes (Deleted)
Location:    [facility] Room Number: 144/P Place of Service:  SNF (31)   CODE STATUS: Full Code  Allergies  Allergen Reactions  . Ambien [Zolpidem Tartrate] Other (See Comments)    Sleep walks  . Lipitor [Atorvastatin Calcium] Other (See Comments)    myalgias  . Ranitidine Other (See Comments)    Chest discomfort  . Simvastatin Other (See Comments)    Myalgias  . Xanax Xr [Alprazolam Er] Other (See Comments)    Tightness in chest  . Cholestatin Other (See Comments)    UNSPECIFIED REACTION   . Clopidogrel Bisulfate Rash    Chief Complaint  Patient presents with  . Short Term Rehab    Routine Visit    HPI:    Past Medical History:  Diagnosis Date  . Acute on chronic systolic CHF (congestive heart failure) ([place]) 02/09/2017  . Acute respiratory failure with hypoxia ([place])   . AKI (acute kidney injury) ([place]) 08/20/2015  . Altered mental status 02/21/2017  . Aortic stenosis    a. mod-sev by echo 05/2016.  . Asthma   . Asthma exacerbation 08/20/2015  . Atrial fibrillation (HCC)    Paroxysmal; Echo in 2007-normal EF; mild LVH; LA enlargement; mild AS, minimal AI; neg. stress nuclear study in 2008   . BPH (benign prostatic hypertrophy) with urinary obstruction 09/27/2011   Transurethral resection of the prostate in 09/2011   . CAD (coronary artery disease), native coronary artery 06/28/2016  . Cardiogenic shock ([place])   . Carotid stenosis 04/25/2012  . Cerebrovascular disease 07/2010   TIA; carotid ultrasound in 07/2010-significant bilateral plaque without focal internal carotid artery stenosis; MRI -encephalomalacia left temporal and right temporal lobes; small inferior right cerebellar infarct; small vessel disease  . Chronic atrial fibrillation (HCC)    Paroxysmal; Echocardiogram in 2007-normal EF; mild LVH; left atrial enlargement; mild stenosis and minimal AI; negative stress nuclear study in 2008  . Chronic systolic CHF (congestive heart failure) ([place])     a. dx 05/2016 - EF 35-40%, diffuse HK, mod-severe AS, mod gradient, severe AVA VTI likely due to decreased cardiac output in setting of systolic dysfsunction and significant mitral regurgitation, mild MR, mod-severe MR, severe LAE, mild-mod RV dilation, mild RAE, mild-mod TR, mod PASP 54mmHg.  . CKD (chronic kidney disease), stage II   . Cognitive dysfunction 05/29/2015   I believe that this is related to his age as well as previous stroke   . Congestive heart failure (CHF) ([place]) 02/09/2017  . Degenerative joint disease    feet and legs  . Diabetes mellitus    no insulin; A1c of 6.6 in 2005  . Dizziness    occurs daily,especially in am  . Elevated troponin   . Exertional dyspnea   . Gastroesophageal reflux disease   . Hepatic steatosis   . History of noncompliance with medical treatment   . Hyperlipidemia    adverse reactions to statins and niacin  . Hypertension    Borderline  . Irregular heartbeat   . Mitral regurgitation    a. mod-sev by echo 05/2016  . Peripheral vascular disease ([place])   . Renal insufficiency   . S/P TAVR (transcatheter aortic valve replacement) 07/19/2016   29 mm Edwards Sapien 3 transcatheter heart valve placed via left percutaneous transfemoral approach  . Senile purpura ([place]) 08/15/2018  . Temporal arteritis ([place])   . Tricuspid regurgitation    a. mild-mod TR by echo 05/2016  . VT (ventricular tachycardia) ([place])  Past Surgical History:  Procedure Laterality Date  . COLONOSCOPY  2002  . COLONOSCOPY  01/19/2012   Procedure: COLONOSCOPY;  Surgeon: Rogene Houston, MD;  [facility];  Service: Endoscopy;  Laterality: N/A;  1030  . LIPOMA EXCISION  1980  . ORIF ANKLE FRACTURE  2000   Right  . PERIPHERAL VASCULAR CATHETERIZATION N/A 07/11/2016   Procedure: Carotid PTA/Stent Intervention;  Surgeon: Lorretta Harp, MD;  Location: [facility];  Service: Cardiovascular;  Laterality: N/A;  . PROSTATE SURGERY  12/2011  . RIGHT HEART CATH AND  CORONARY ANGIOGRAPHY N/A 05/11/2020   Procedure: RIGHT HEART CATH AND CORONARY ANGIOGRAPHY;  Surgeon: Nelva Bush, MD;  Location: [facility] LAB;  Service: Cardiovascular;  Laterality: N/A;  . ROTATOR CUFF REPAIR     Right  . TEE WITHOUT CARDIOVERSION N/A 06/17/2016   Procedure: TRANSESOPHAGEAL ECHOCARDIOGRAM (TEE);  Surgeon: Jerline Pain, MD;  Location: [facility];  Service: Cardiovascular;  Laterality: N/A;  . TEE WITHOUT CARDIOVERSION N/A 07/19/2016   Procedure: TRANSESOPHAGEAL ECHOCARDIOGRAM (TEE);  Surgeon: Sherren Mocha, MD;  Location: [facility];  Service: Open Heart Surgery;  Laterality: N/A;  . TRANSCATHETER AORTIC VALVE REPLACEMENT, TRANSFEMORAL N/A 07/19/2016   Procedure: TRANSCATHETER AORTIC VALVE REPLACEMENT, TRANSFEMORAL;  Surgeon: Sherren Mocha, MD;  Location: [facility];  Service: Open Heart Surgery;  Laterality: N/A;  . TRANSURETHRAL RESECTION OF PROSTATE  09/2011  . URETHRAL STRICTURE DILATATION  1980s    Social History   Socioeconomic History  . Marital status: Widowed    Spouse name: Not on file  . Number of children: 1  . Years of education: Not on file  . Highest education level: Not on file  Occupational History  . Occupation: Retired  Tobacco Use  . Smoking status: Former Smoker    Packs/day: 1.00    Years: 20.00    Pack years: 20.00    Types: Cigarettes    Start date: 07/04/1950    Quit date: 04/25/1992    Years since quitting: 28.1  . Smokeless tobacco: Current User    Types: Chew  Substance and Sexual Activity  . Alcohol use: No    Alcohol/week: 0.0 standard drinks  . Drug use: No  . Sexual activity: Not Currently  Other Topics Concern  . Not on file  Social History Narrative  . Not on file   Social Determinants of Health   Financial Resource Strain:   . Difficulty of Paying Living Expenses:   Food Insecurity:   . Worried About Charity fundraiser in the Last Year:   . Arboriculturist in the Last Year:   Transportation Needs:   . Lexicographer (Medical):   [name] Lack of Transportation (Non-Medical):   Physical Activity:   . Days of Exercise per Week:   . Minutes of Exercise per Session:   Stress:   . Feeling of Stress :   Social Connections:   . Frequency of Communication with Friends and Family:   . Frequency of Social Gatherings with Friends and Family:   . Attends Religious Services:   . Active Member of Clubs or Organizations:   . Attends Archivist Meetings:   [name] Marital Status:   Intimate Partner Violence:   . Fear of Current or Ex-Partner:   . Emotionally Abused:   [name] Physically Abused:   . Sexually Abused:    Family History  Problem Relation Age of Onset  . Stroke Mother   .  Diabetes Father   . Colon cancer Neg Hx       VITAL SIGNS BP 109/63   Pulse 72   Temp (!) 97.5 F (36.4 C) (Oral)   Resp 16   Ht 6\' 1"  (1.854 m)   Wt 222 lb 3.2 oz (100.8 kg)   SpO2 98%   BMI 29.32 kg/m   Outpatient Encounter Medications as of 05/29/2020  Medication Sig  . albuterol (PROVENTIL) (2.5 MG/3ML) 0.083% nebulizer solution Take 3 mLs (2.5 mg total) by nebulization every 6 (six) hours as needed for wheezing or shortness of breath.  [name] aspirin EC 81 MG EC tablet Take 1 tablet (81 mg total) by mouth daily. Swallow whole.  . bisoprolol (ZEBETA) 5 MG tablet Take 0.5 tablets (2.5 mg total) by mouth daily.  . cholecalciferol (VITAMIN D3) 25 MCG (1000 UNIT) tablet Take 1,000 Units by mouth in the morning and at bedtime.  . digoxin (LANOXIN) 0.125 MG tablet Take 1 tablet (0.125 mg total) by mouth daily.  . furosemide (LASIX) 20 MG tablet Take 20 mg by mouth daily.  . insulin glargine (LANTUS SOLOSTAR) 100 UNIT/ML Solostar Pen Inject 28 Units into the skin every evening.  . Melatonin 10 MG TABS Take 10 mg by mouth at bedtime.  . NON FORMULARY Diet: _x____ Regular, ______ NAS, ____x___Consistent Carbohydrate, _______NPO _____Other  . NON FORMULARY Diet - Liquids: _x__Regular; ___Thickened ___ Consistency:  ___ Nectar, ___Honey, ___ Pudding; ____ Fluid Restriction  . nortriptyline (PAMELOR) 10 MG capsule Take 10 mg by mouth at bedtime. For burning in feet  . potassium chloride SA (KLOR-CON) 20 MEQ tablet TAKE 1 TABLET BY MOUTH ON MONDAY, WEDNESDAY AND FRIDAY.  [name] psyllium (METAMUCIL) 58.6 % powder Take 1 packet by mouth daily.  . rosuvastatin (CRESTOR) 5 MG tablet TAKE (1) TABLET BY MOUTH ONCE DAILY AT BEDTIME.  [name] spironolactone (ALDACTONE) 25 MG tablet Take 0.5 tablets (12.5 mg total) by mouth at bedtime.  [name] warfarin (COUMADIN) 3 MG tablet Take 3 mg by mouth. Every Sun,Tues, Thurs.  . warfarin (COUMADIN) 6 MG tablet Take 6 mg by mouth every Monday, Wednesday, Friday, and Saturday at 6 PM.    No facility-administered encounter medications on file as of 05/29/2020.     SIGNIFICANT DIAGNOSTIC EXAMS       ASSESSMENT/ PLAN:    MD is aware of resident's narcotic use and is in agreement with current plan of care. We will attempt to wean resident as appropriate.  Ok Edwards NP [facility]  Contact [phone] Monday through Friday 8am- 5pm  After hours call [phone]

## 2020-05-29 NOTE — Telephone Encounter (Signed)
Patient scheduled nurse visit for INR next week

## 2020-05-31 DIAGNOSIS — J9601 Acute respiratory failure with hypoxia: Secondary | ICD-10-CM | POA: Diagnosis not present

## 2020-05-31 DIAGNOSIS — M159 Polyosteoarthritis, unspecified: Secondary | ICD-10-CM | POA: Diagnosis not present

## 2020-05-31 DIAGNOSIS — I5023 Acute on chronic systolic (congestive) heart failure: Secondary | ICD-10-CM | POA: Diagnosis not present

## 2020-05-31 DIAGNOSIS — I4811 Longstanding persistent atrial fibrillation: Secondary | ICD-10-CM | POA: Diagnosis not present

## 2020-05-31 DIAGNOSIS — N179 Acute kidney failure, unspecified: Secondary | ICD-10-CM | POA: Diagnosis not present

## 2020-05-31 DIAGNOSIS — J4520 Mild intermittent asthma, uncomplicated: Secondary | ICD-10-CM | POA: Diagnosis not present

## 2020-05-31 DIAGNOSIS — G8929 Other chronic pain: Secondary | ICD-10-CM | POA: Diagnosis not present

## 2020-05-31 DIAGNOSIS — D631 Anemia in chronic kidney disease: Secondary | ICD-10-CM | POA: Diagnosis not present

## 2020-05-31 DIAGNOSIS — E1151 Type 2 diabetes mellitus with diabetic peripheral angiopathy without gangrene: Secondary | ICD-10-CM | POA: Diagnosis not present

## 2020-05-31 DIAGNOSIS — I2510 Atherosclerotic heart disease of native coronary artery without angina pectoris: Secondary | ICD-10-CM | POA: Diagnosis not present

## 2020-05-31 DIAGNOSIS — E1142 Type 2 diabetes mellitus with diabetic polyneuropathy: Secondary | ICD-10-CM | POA: Diagnosis not present

## 2020-05-31 DIAGNOSIS — N1832 Chronic kidney disease, stage 3b: Secondary | ICD-10-CM | POA: Diagnosis not present

## 2020-05-31 DIAGNOSIS — I130 Hypertensive heart and chronic kidney disease with heart failure and stage 1 through stage 4 chronic kidney disease, or unspecified chronic kidney disease: Secondary | ICD-10-CM | POA: Diagnosis not present

## 2020-06-01 ENCOUNTER — Telehealth: Payer: Self-pay | Admitting: Family Medicine

## 2020-06-01 NOTE — Telephone Encounter (Signed)
Patient's niece(Barbara) calling wanting appointment for patient due to his confusion. It happening more and more she states.I explain had to send back message to see where you can put him in at.Please advise.

## 2020-06-01 NOTE — Telephone Encounter (Signed)
It would be fine to go ahead and move him from a nurse visit over to my schedule at 2 PM on Tuesday But the patient would still come at 130 for the nurse to do a INR Then I will see them after that

## 2020-06-01 NOTE — Telephone Encounter (Signed)
Scheduled office visit and INR for tomorrow at 2 pm

## 2020-06-02 ENCOUNTER — Other Ambulatory Visit: Payer: Self-pay

## 2020-06-02 ENCOUNTER — Telehealth: Payer: Self-pay | Admitting: Family Medicine

## 2020-06-02 ENCOUNTER — Other Ambulatory Visit: Payer: PPO

## 2020-06-02 ENCOUNTER — Ambulatory Visit: Payer: PPO | Admitting: Family Medicine

## 2020-06-02 ENCOUNTER — Encounter: Payer: Self-pay | Admitting: Family Medicine

## 2020-06-02 ENCOUNTER — Ambulatory Visit (INDEPENDENT_AMBULATORY_CARE_PROVIDER_SITE_OTHER): Payer: PPO | Admitting: Family Medicine

## 2020-06-02 VITALS — BP 134/76 | Temp 96.7°F | Wt 218.0 lb

## 2020-06-02 DIAGNOSIS — F0390 Unspecified dementia without behavioral disturbance: Secondary | ICD-10-CM

## 2020-06-02 DIAGNOSIS — N289 Disorder of kidney and ureter, unspecified: Secondary | ICD-10-CM

## 2020-06-02 DIAGNOSIS — G309 Alzheimer's disease, unspecified: Secondary | ICD-10-CM | POA: Diagnosis not present

## 2020-06-02 DIAGNOSIS — Z7901 Long term (current) use of anticoagulants: Secondary | ICD-10-CM

## 2020-06-02 DIAGNOSIS — I5022 Chronic systolic (congestive) heart failure: Secondary | ICD-10-CM

## 2020-06-02 LAB — POCT INR: INR: 3.3 — AB (ref 2.0–3.0)

## 2020-06-02 NOTE — Telephone Encounter (Signed)
Verbal order given to [facility] at [facility]

## 2020-06-02 NOTE — Patient Instructions (Signed)
Take 1/2 tablet on Sunday, Tuesday, Thursday and Friday. Take one tablet on Monday, Wednesday and Saturday. Follow up in one month for office visit and INR.

## 2020-06-02 NOTE — Telephone Encounter (Signed)
[facility] with [facility] is needing verbal orders for patient for physical therapy 3 times a week for 4 weeks. [phone]

## 2020-06-02 NOTE — Progress Notes (Signed)
   Subjective:    Patient ID: [name] [name], male    DOB: [birth year], 86 y.o.   MRN: [id number]  HPI Pt here today for memory loss. Niece and nephew here with patient today. Family states that memory loss is worsening.  Patient having a very difficult time Having a lot of difficult time with his memory often recalling issues that are not true or recalling that family is still alive It is been very difficult also for the patient to follow even the guidance of family members.  Patient not aggressive.  Pt was in hospital on 05/11/20 and since then he has went down hill with memory/confusion. Pt states that he has not been able to remember for about 2 weeks.    Review of Systems Please see above    Objective:   Physical Exam  30 minutes spent with patient Patient can recall items 2 out of 3 after distraction 2 out of 3 clock drawing he fails.  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 0 No  Risk for fall due to : Impaired balance/gait;Impaired mobility -  Risk for fall due to: Comment uses walker -    Assessment & Plan:  Dementia with behavioral disturbance Patient not aggressive not threatening anybody We did discuss in detail how the mind can sometimes start thinking faulty with difficult time remembering what the true past was  Family will try to do the best they can at reorienting him No driving no riding in the lawnmower Adjusted INR today Lab work indicated because of history of CHF stable currently Recheck metabolic 7 Set up CT scan patient would not tolerate MRI  Bring patient back in 1 month to check INR and discuss all of this further Hold off on any type of  psychotropic medications currently Follow-up if progressive troubles or worse Warning signs regarding CHF regarding what to watch for were discussed

## 2020-06-02 NOTE — Telephone Encounter (Signed)
That would be that would be fine

## 2020-06-03 LAB — BASIC METABOLIC PANEL
BUN/Creatinine Ratio: 15 (ref 10–24)
BUN: 23 mg/dL (ref 8–27)
CO2: 22 mmol/L (ref 20–29)
Calcium: 9.9 mg/dL (ref 8.6–10.2)
Chloride: 99 mmol/L (ref 96–106)
Creatinine, Ser: 1.58 mg/dL — ABNORMAL HIGH (ref 0.76–1.27)
GFR calc Af Amer: 45 mL/min/{1.73_m2} — ABNORMAL LOW (ref >59)
GFR calc non Af Amer: 39 mL/min/{1.73_m2} — ABNORMAL LOW (ref >59)
Glucose: 164 mg/dL — ABNORMAL HIGH (ref 65–99)
Potassium: 4.9 mmol/L (ref 3.5–5.2)
Sodium: 138 mmol/L (ref 134–144)

## 2020-06-03 LAB — HEPATIC FUNCTION PANEL
ALT: 48 IU/L — ABNORMAL HIGH (ref 0–44)
AST: 42 IU/L — ABNORMAL HIGH (ref 0–40)
Albumin: 4.4 g/dL (ref 3.6–4.6)
Alkaline Phosphatase: 117 IU/L (ref 48–121)
Bilirubin Total: 0.4 mg/dL (ref 0.0–1.2)
Bilirubin, Direct: 0.18 mg/dL (ref 0.00–0.40)
Total Protein: 7.8 g/dL (ref 6.0–8.5)

## 2020-06-03 LAB — BRAIN NATRIURETIC PEPTIDE: BNP: 89.6 pg/mL (ref 0.0–100.0)

## 2020-06-04 ENCOUNTER — Encounter: Payer: Self-pay | Admitting: Family Medicine

## 2020-06-04 ENCOUNTER — Other Ambulatory Visit: Payer: Self-pay

## 2020-06-04 ENCOUNTER — Telehealth: Payer: Self-pay | Admitting: Family Medicine

## 2020-06-04 NOTE — Telephone Encounter (Signed)
[facility] for niece-Barbara Henrene Pastor [phone], need to give CT appt info  Appt:  Wednesday, 06/10/2020, arrive @ [facility] 6:30pm (to reschedule call [phone])   (mailed letter to home address as well) (voicemail for main # was full, couldn't leave a message)

## 2020-06-05 NOTE — Telephone Encounter (Signed)
[name] returned call and I notified her of the appt time for the CT scan. She understood.

## 2020-06-10 ENCOUNTER — Ambulatory Visit (HOSPITAL_COMMUNITY)
Admission: RE | Admit: 2020-06-10 | Discharge: 2020-06-10 | Disposition: A | Discharge status: Home / Self Care | Payer: PPO | Source: Ambulatory Visit | Attending: Family Medicine | Admitting: Family Medicine

## 2020-06-10 ENCOUNTER — Other Ambulatory Visit: Payer: Self-pay

## 2020-06-10 DIAGNOSIS — F0390 Unspecified dementia without behavioral disturbance: Secondary | ICD-10-CM | POA: Diagnosis not present

## 2020-06-10 DIAGNOSIS — R9082 White matter disease, unspecified: Secondary | ICD-10-CM | POA: Diagnosis not present

## 2020-06-10 DIAGNOSIS — G309 Alzheimer's disease, unspecified: Secondary | ICD-10-CM | POA: Diagnosis not present

## 2020-06-10 DIAGNOSIS — R413 Other amnesia: Secondary | ICD-10-CM | POA: Diagnosis not present

## 2020-06-11 ENCOUNTER — Ambulatory Visit (HOSPITAL_COMMUNITY)
Admit: 2020-06-11 | Discharge: 2020-06-11 | Disposition: A | Discharge status: Home / Self Care | Payer: PPO | Attending: Internal Medicine | Admitting: Internal Medicine

## 2020-06-11 ENCOUNTER — Encounter (HOSPITAL_COMMUNITY): Payer: Self-pay

## 2020-06-11 ENCOUNTER — Telehealth (HOSPITAL_COMMUNITY): Payer: Self-pay

## 2020-06-11 VITALS — BP 110/70 | HR 65 | Wt 224.6 lb

## 2020-06-11 DIAGNOSIS — N183 Chronic kidney disease, stage 3 unspecified: Secondary | ICD-10-CM | POA: Insufficient documentation

## 2020-06-11 DIAGNOSIS — I130 Hypertensive heart and chronic kidney disease with heart failure and stage 1 through stage 4 chronic kidney disease, or unspecified chronic kidney disease: Secondary | ICD-10-CM | POA: Diagnosis not present

## 2020-06-11 DIAGNOSIS — Z7982 Long term (current) use of aspirin: Secondary | ICD-10-CM | POA: Diagnosis not present

## 2020-06-11 DIAGNOSIS — Z952 Presence of prosthetic heart valve: Secondary | ICD-10-CM | POA: Insufficient documentation

## 2020-06-11 DIAGNOSIS — R42 Dizziness and giddiness: Secondary | ICD-10-CM | POA: Diagnosis not present

## 2020-06-11 DIAGNOSIS — Z79899 Other long term (current) drug therapy: Secondary | ICD-10-CM | POA: Insufficient documentation

## 2020-06-11 DIAGNOSIS — Z888 Allergy status to other drugs, medicaments and biological substances status: Secondary | ICD-10-CM | POA: Diagnosis not present

## 2020-06-11 DIAGNOSIS — I2510 Atherosclerotic heart disease of native coronary artery without angina pectoris: Secondary | ICD-10-CM | POA: Diagnosis not present

## 2020-06-11 DIAGNOSIS — N1832 Chronic kidney disease, stage 3b: Secondary | ICD-10-CM | POA: Diagnosis not present

## 2020-06-11 DIAGNOSIS — Z7901 Long term (current) use of anticoagulants: Secondary | ICD-10-CM | POA: Diagnosis not present

## 2020-06-11 DIAGNOSIS — R5381 Other malaise: Secondary | ICD-10-CM | POA: Insufficient documentation

## 2020-06-11 DIAGNOSIS — E785 Hyperlipidemia, unspecified: Secondary | ICD-10-CM | POA: Insufficient documentation

## 2020-06-11 DIAGNOSIS — Z794 Long term (current) use of insulin: Secondary | ICD-10-CM | POA: Diagnosis not present

## 2020-06-11 DIAGNOSIS — I5022 Chronic systolic (congestive) heart failure: Secondary | ICD-10-CM | POA: Insufficient documentation

## 2020-06-11 DIAGNOSIS — Z8673 Personal history of transient ischemic attack (TIA), and cerebral infarction without residual deficits: Secondary | ICD-10-CM | POA: Insufficient documentation

## 2020-06-11 DIAGNOSIS — K760 Fatty (change of) liver, not elsewhere classified: Secondary | ICD-10-CM | POA: Diagnosis not present

## 2020-06-11 DIAGNOSIS — Z833 Family history of diabetes mellitus: Secondary | ICD-10-CM | POA: Insufficient documentation

## 2020-06-11 DIAGNOSIS — E1122 Type 2 diabetes mellitus with diabetic chronic kidney disease: Secondary | ICD-10-CM | POA: Diagnosis not present

## 2020-06-11 DIAGNOSIS — J45909 Unspecified asthma, uncomplicated: Secondary | ICD-10-CM | POA: Insufficient documentation

## 2020-06-11 DIAGNOSIS — Z87891 Personal history of nicotine dependence: Secondary | ICD-10-CM | POA: Insufficient documentation

## 2020-06-11 DIAGNOSIS — I350 Nonrheumatic aortic (valve) stenosis: Secondary | ICD-10-CM | POA: Insufficient documentation

## 2020-06-11 DIAGNOSIS — F0390 Unspecified dementia without behavioral disturbance: Secondary | ICD-10-CM | POA: Diagnosis not present

## 2020-06-11 DIAGNOSIS — I482 Chronic atrial fibrillation, unspecified: Secondary | ICD-10-CM | POA: Insufficient documentation

## 2020-06-11 DIAGNOSIS — K219 Gastro-esophageal reflux disease without esophagitis: Secondary | ICD-10-CM | POA: Insufficient documentation

## 2020-06-11 DIAGNOSIS — I5023 Acute on chronic systolic (congestive) heart failure: Secondary | ICD-10-CM | POA: Diagnosis not present

## 2020-06-11 LAB — BASIC METABOLIC PANEL
Anion gap: 11 (ref 5–15)
BUN: 18 mg/dL (ref 8–23)
CO2: 25 mmol/L (ref 22–32)
Calcium: 9.4 mg/dL (ref 8.9–10.3)
Chloride: 98 mmol/L (ref 98–111)
Creatinine, Ser: 1.32 mg/dL — ABNORMAL HIGH (ref 0.61–1.24)
GFR calc Af Amer: 57 mL/min — ABNORMAL LOW (ref >60)
GFR calc non Af Amer: 49 mL/min — ABNORMAL LOW (ref >60)
Glucose, Bld: 241 mg/dL — ABNORMAL HIGH (ref 70–99)
Potassium: 5.5 mmol/L — ABNORMAL HIGH (ref 3.5–5.1)
Sodium: 134 mmol/L — ABNORMAL LOW (ref 135–145)

## 2020-06-11 LAB — BRAIN NATRIURETIC PEPTIDE: B Natriuretic Peptide: 141.9 pg/mL — ABNORMAL HIGH (ref 0.0–100.0)

## 2020-06-11 LAB — CBC
HCT: 47.1 % (ref 39.0–52.0)
Hemoglobin: 15.3 g/dL (ref 13.0–17.0)
MCH: 30.1 pg (ref 26.0–34.0)
MCHC: 32.5 g/dL (ref 30.0–36.0)
MCV: 92.7 fL (ref 80.0–100.0)
Platelets: 220 10*3/uL (ref 150–400)
RBC: 5.08 MIL/uL (ref 4.22–5.81)
RDW: 14.8 % (ref 11.5–15.5)
WBC: 10.3 10*3/uL (ref 4.0–10.5)
nRBC: 0 % (ref 0.0–0.2)

## 2020-06-11 LAB — DIGOXIN LEVEL: Digoxin Level: 1.1 ng/mL (ref 0.8–2.0)

## 2020-06-11 MED ORDER — DIGOXIN 125 MCG PO TABS: 0.0625 mg | ORAL_TABLET | Freq: Every day | ORAL | 3 refills | Status: DC

## 2020-06-11 MED ORDER — ALPRAZOLAM 0.25 MG PO TABS
0.2500 mg | ORAL_TABLET | Freq: Three times a day (TID) | ORAL | 0 refills | Status: DC | PRN
Start: 2020-06-11 — End: 2020-07-03

## 2020-06-11 NOTE — Telephone Encounter (Signed)
Pt and patients family members advised;apps scheduled,lab order entered,med list updated to reflect changes

## 2020-06-11 NOTE — Telephone Encounter (Signed)
-----   Message from Consuelo Pandy, [place] sent at 06/11/2020  1:47 PM EDT ----- BNP (fluid marked) slightly elevated. Kidney function stable but K mildly elevated.  Instruct pt to take an extra lasix tablet today, 20 mg.  Stop KCl. Stop Spironolactone.   Dig level also elevated. Hold Digoxin x 2 days, then reduced down to 1/2 tablet daily, 0.6125 mg. Return for repeat BMP, BNP and repeat dig level on Monday. F/u w/ PharmD in 2 weeks.

## 2020-06-11 NOTE — Patient Instructions (Signed)
START Xanax 0.25 mg, one tab three times daily as needed  Labs today We will only contact you if something comes back abnormal or we need to make some changes. Otherwise no news is good news!  Marland KitchenYour physician recommends that you schedule a follow-up appointment in: 6 weeks with Dr Haroldine Laws  Do the following things EVERYDAY: 1) Weigh yourself in the morning before breakfast. Write it down and keep it in a log. 2) Take your medicines as prescribed 3) Eat low salt foods--Limit salt (sodium) to 2000 mg per day.  4) Stay as active as you can everyday 5) Limit all fluids for the day to less than 2 liters  If you have any questions or concerns before your next appointment please send [place] a message through [place] or call our office at [phone].    TO LEAVE A MESSAGE FOR THE NURSE SELECT OPTION 2, PLEASE LEAVE A MESSAGE INCLUDING:  YOUR NAME  DATE OF BIRTH  CALL BACK NUMBER  REASON FOR CALL**this is important as we prioritize the call backs  YOU WILL RECEIVE A CALL BACK THE SAME DAY AS LONG AS YOU CALL BEFORE 4:00 PM

## 2020-06-11 NOTE — Progress Notes (Signed)
Advanced Heart Failure Clinic Note   Referring Physician: PCP: Kathyrn Drown, MD PCP-Cardiologist: Sherren Mocha, MD  AHF: Dr. Haroldine Laws   HPI:   86 y/o male, widower (wife died 2 years ago), with DM2, HTN, CKD, CHF with prior EF 40%, CAD, AS s/p TAVR in 2017 and dementia, who was referred for diagnostic [facility] for worsening dyspnea.   R/LHC on 05/11/20 demonstrated elevated filling pressures c/w volume overload, low output HF and significant 2v CAD. CI 1.6 by Fick calculation. 1.3 by Thermodilution. PCWP 27 mmHg. Angiography showed  80-90% proximal LAD stenosis involving ostium of D1 (70% D1 stenosis), and sequential 50% ostial and proximal LCx lesions followed by 80% mid/distal LCx stenosis (similar in appearance to 2017). Moderate, diffuse RCA disease similar to 2017.  He was directly admitted from the cath lab and started on milrinone,  IV Lasix and IV heparin. Echo was repeated and demonstrated LVEF 25-40% (lower than prior study), RV ok, moderate MR, prosthetic AV ok. He diuresed well and hemodynamics improved on milrinone. Transitioned off milrinone and placed back on PO diuretics. EP evaluated for potential CRT pacing but he was deemed not a candidate given QRS duration, advanced age and AFib. Case also discussed with interventionalist and it was decided not to intervene on the LAD given lack of CP. Will continue to optimize medically. He was treated w/ ASA, statin, ? blocker as well as spiro and digoxin for HF. BP too soft for ARNI/ARB. Coumadin resumed for AF.   PT/OT evaluated and recommended SNF. He was discharged to SNF on 7/1.   He presents to clinic today for post hospital f/u. He is here w/ his family (nephew and niece). He completed therapy at SNF. Now back at home w/ 24 hr nursing care and home PT.   Wt is up slightly from d/c wt, 217>>224 lb but appetite has improved since leaving the hospital. No CP. Euvolemic on exam. Continues w/ NYHA III symptoms, chronic. Ambulates  w/ walker. No resting symptoms. Sleeps w/ only 1 pillow. No orthopnea/PND. Tolerating medications ok. He does note slight dizziness upon standing but quickly resolves. No syncope/near syncope. BP stable 110/70. EKG shows chronic rate controlled afib 59 bpm. QT/QTc ok 404/399 ms. No abnormal bleeding w/ coumadin. No falls.   R/LHC 05/11/20 Conclusions: 1. Severe two-vessel coronary artery disease, including 80-90% proximal LAD stenosis involving ostium of D1 (70% D1 stenosis), and sequential 50% ostial and proximal LCx lesions followed by 80% mid/distal LCx stenosis (similar in appearance to 2017). 2. Moderate, diffuse RCA disease similar to 2017. 3. Moderately elevated left heart, right heart, and pulmonary artery pressures. 4. Prominent V-waves on PCWP tracing; query more severe mitral regurgitation than reported on echocardiogram in 02/2020. 5. Severely reduced cardiac output/index.  Right Heart Pressures RA (mean): 13 mmHg RV (S/EDP): 60/12 mmHg PA (S/D, mean): 60/30 (40) mmHg PCWP (mean): 27 mmHg with prominent V-waves  Ao sat: 98% PA sat: 55%  Fick CO: 3.4 L/min Fick CI: 1.6 L/min/m^2  Thermodilution CO: 2.8 L/min Thermodilution CI: 1.3 L/min/m^2    2D Echo 04/2020 Left ventricular ejection fraction, by estimation, is 35 to 40%. The left ventricle has moderately decreased function. The left ventricle demonstrates global hypokinesis. Left ventricular diastolic function could not be evaluated. 2. Right ventricular systolic function is normal. The right ventricular size is normal. There is mildly elevated pulmonary artery systolic pressure. 3. Left atrial size was severely dilated. 4. Right atrial size was moderately dilated. 5. The mitral valve is normal in  structure. Moderate mitral valve regurgitation. 6. Tricuspid valve regurgitation is moderate. 7. The aortic valve has been repaired/replaced. Aortic valve regurgitation is not visualized. There is a 29 mm Edwards Sapien  prosthetic (TAVR) valve present in the aortic position. Echo findings are consistent with normal structure and function of the aortic valve prosthesis. 8. The inferior vena cava is normal in size with greater than 50% respiratory variability, suggesting right atrial pressure of 3 mmHg.     Review of Systems: [y] = yes, [ ]  = no   General: Weight gain [ ] ; Weight loss [ ] ; Anorexia [ ] ; Fatigue [Y ]; Fever [ ] ; Chills [ ] ; Weakness [ Y]  Cardiac: Chest pain/pressure [ ] ; Resting SOB [ ] ; Exertional SOB [ Y]; Orthopnea [ ] ; Pedal Edema [ ] ; Palpitations [ ] ; Syncope [ ] ; Presyncope [ ] ; Paroxysmal nocturnal dyspnea[ ]   Pulmonary: Cough [ ] ; Wheezing[ ] ; Hemoptysis[ ] ; Sputum [ ] ; Snoring [ ]   GI: Vomiting[ ] ; Dysphagia[ ] ; Melena[ ] ; Hematochezia [ ] ; Heartburn[ ] ; Abdominal pain [ ] ; Constipation [ ] ; Diarrhea [ ] ; BRBPR [ ]   GU: Hematuria[ ] ; Dysuria [ ] ; Nocturia[ ]   Vascular: Pain in legs with walking [ ] ; Pain in feet with lying flat [ ] ; Non-healing sores [ ] ; Stroke [ ] ; TIA [ ] ; Slurred speech [ ] ;  Neuro: Headaches[ ] ; Vertigo[ ] ; Seizures[ ] ; Paresthesias[ ] ;Blurred vision [ ] ; Diplopia [ ] ; Vision changes [ ]   Ortho/Skin: Arthritis [ ] ; Joint pain [ ] ; Muscle pain [ ] ; Joint swelling [ ] ; Back Pain [ ] ; Rash [ ]   Psych: Depression[ ] ; Anxiety[ ]   Heme: Bleeding problems [ ] ; Clotting disorders [ ] ; Anemia [ ]   Endocrine: Diabetes [ ] ; Thyroid dysfunction[ ]    Past Medical History:  Diagnosis Date  . Acute on chronic systolic CHF (congestive heart failure) ([place]) 02/09/2017  . Acute respiratory failure with hypoxia ([place])   . AKI (acute kidney injury) ([place]) 08/20/2015  . Altered mental status 02/21/2017  . Aortic stenosis    a. mod-sev by echo 05/2016.  . Asthma   . Asthma exacerbation 08/20/2015  . Atrial fibrillation (HCC)    Paroxysmal; Echo in 2007-normal EF; mild LVH; LA enlargement; mild AS, minimal AI; neg. stress nuclear study in 2008   . BPH (benign prostatic  hypertrophy) with urinary obstruction 09/27/2011   Transurethral resection of the prostate in 09/2011   . CAD (coronary artery disease), native coronary artery 06/28/2016  . Cardiogenic shock ([place])   . Carotid stenosis 04/25/2012  . Cerebrovascular disease 07/2010   TIA; carotid ultrasound in 07/2010-significant bilateral plaque without focal internal carotid artery stenosis; MRI -encephalomalacia left temporal and right temporal lobes; small inferior right cerebellar infarct; small vessel disease  . Chronic atrial fibrillation (HCC)    Paroxysmal; Echocardiogram in 2007-normal EF; mild LVH; left atrial enlargement; mild stenosis and minimal AI; negative stress nuclear study in 2008  . Chronic systolic CHF (congestive heart failure) ([place])    a. dx 05/2016 - EF 35-40%, diffuse HK, mod-severe AS, mod gradient, severe AVA VTI likely due to decreased cardiac output in setting of systolic dysfsunction and significant mitral regurgitation, mild MR, mod-severe MR, severe LAE, mild-mod RV dilation, mild RAE, mild-mod TR, mod PASP 85mmHg.  . CKD (chronic kidney disease), stage II   . Cognitive dysfunction 05/29/2015   I believe that this is related to his age as well as previous stroke   . Congestive heart failure (CHF) ([place]) 02/09/2017  .  Degenerative joint disease    feet and legs  . Diabetes mellitus    no insulin; A1c of 6.6 in 2005  . Dizziness    occurs daily,especially in am  . Elevated troponin   . Exertional dyspnea   . Gastroesophageal reflux disease   . Hepatic steatosis   . History of noncompliance with medical treatment   . Hyperlipidemia    adverse reactions to statins and niacin  . Hypertension    Borderline  . Irregular heartbeat   . Mitral regurgitation    a. mod-sev by echo 05/2016  . Peripheral vascular disease ([place])   . Renal insufficiency   . S/P TAVR (transcatheter aortic valve replacement) 07/19/2016   29 mm Edwards Sapien 3 transcatheter heart valve placed via left  percutaneous transfemoral approach  . Senile purpura ([place]) 08/15/2018  . Temporal arteritis ([place])   . Tricuspid regurgitation    a. mild-mod TR by echo 05/2016  . VT (ventricular tachycardia) (HCC)     Current Outpatient Medications  Medication Sig Dispense Refill  . albuterol (PROVENTIL) (2.5 MG/3ML) 0.083% nebulizer solution Take 3 mLs (2.5 mg total) by nebulization every 6 (six) hours as needed for wheezing or shortness of breath. 150 mL 0  . aspirin EC 81 MG EC tablet Take 1 tablet (81 mg total) by mouth daily. Swallow whole. 30 tablet 11  . bisoprolol (ZEBETA) 5 MG tablet Take 0.5 tablets (2.5 mg total) by mouth daily. 15 tablet 0  . cholecalciferol (VITAMIN D3) 25 MCG (1000 UNIT) tablet Take 1,000 Units by mouth in the morning and at bedtime.    . digoxin (LANOXIN) 0.125 MG tablet Take 1 tablet (0.125 mg total) by mouth daily. 30 tablet 0  . furosemide (LASIX) 20 MG tablet Take 1 tablet (20 mg total) by mouth daily. 30 tablet 0  . insulin glargine (LANTUS SOLOSTAR) 100 UNIT/ML Solostar Pen Inject 28 Units into the skin every evening. 15 mL 0  . isosorbide mononitrate (IMDUR) 30 MG 24 hr tablet Take 15 mg by mouth daily.    . Melatonin 10 MG TABS Take 10 mg by mouth at bedtime.    . NON FORMULARY Diet: _x____ Regular, ______ NAS, ____x___Consistent Carbohydrate, _______NPO _____Other    . NON FORMULARY Diet - Liquids: _x__Regular; ___Thickened ___ Consistency: ___ Nectar, ___Honey, ___ Pudding; ____ Fluid Restriction    . nortriptyline (PAMELOR) 10 MG capsule Take 1 capsule (10 mg total) by mouth at bedtime. For burning in feet 30 capsule 0  . potassium chloride SA (KLOR-CON) 20 MEQ tablet TAKE 1 TABLET BY MOUTH ON MONDAY, WEDNESDAY AND FRIDAY. 12 tablet 0  . psyllium (METAMUCIL) 58.6 % powder Take 1 packet by mouth daily.    . rosuvastatin (CRESTOR) 5 MG tablet TAKE (1) TABLET BY MOUTH ONCE DAILY AT BEDTIME. 30 tablet 0  . spironolactone (ALDACTONE) 25 MG tablet Take 0.5 tablets  (12.5 mg total) by mouth at bedtime. 15 tablet 0  . warfarin (COUMADIN) 3 MG tablet Take 1 tablet (3 mg total) by mouth daily at 4 PM. Every Sun,Tues, Thurs. 12 tablet 0  . warfarin (COUMADIN) 6 MG tablet Take 1 tablet (6 mg total) by mouth every Monday, Wednesday, Friday, and Saturday at 6 PM. 16 tablet 0  . ALPRAZolam (XANAX) 0.25 MG tablet Take 1 tablet (0.25 mg total) by mouth 3 (three) times daily as needed for anxiety. 30 tablet 0   No current facility-administered medications for this encounter.    Allergies  Allergen Reactions  .  Ambien [Zolpidem Tartrate] Other (See Comments)    Sleep walks  . Lipitor [Atorvastatin Calcium] Other (See Comments)    myalgias  . Ranitidine Other (See Comments)    Chest discomfort  . Simvastatin Other (See Comments)    Myalgias  . Xanax Xr [Alprazolam Er] Other (See Comments)    Tightness in chest  . Cholestatin Other (See Comments)    UNSPECIFIED REACTION   . Clopidogrel Bisulfate Rash      Social History   Socioeconomic History  . Marital status: Widowed    Spouse name: Not on file  . Number of children: 1  . Years of education: Not on file  . Highest education level: Not on file  Occupational History  . Occupation: Retired  Tobacco Use  . Smoking status: Former Smoker    Packs/day: 1.00    Years: 20.00    Pack years: 20.00    Types: Cigarettes    Start date: 07/04/1950    Quit date: 04/25/1992    Years since quitting: 28.1  . Smokeless tobacco: Current User    Types: Chew  Substance and Sexual Activity  . Alcohol use: No    Alcohol/week: 0.0 standard drinks  . Drug use: No  . Sexual activity: Not Currently  Other Topics Concern  . Not on file  Social History Narrative  . Not on file   Social Determinants of Health   Financial Resource Strain:   . Difficulty of Paying Living Expenses:   Food Insecurity:   . Worried About Charity fundraiser in the Last Year:   . Arboriculturist in the Last Year:   Transportation  Needs:   . Film/video editor (Medical):   [name] Lack of Transportation (Non-Medical):   Physical Activity:   . Days of Exercise per Week:   . Minutes of Exercise per Session:   Stress:   . Feeling of Stress :   Social Connections:   . Frequency of Communication with Friends and Family:   . Frequency of Social Gatherings with Friends and Family:   . Attends Religious Services:   . Active Member of Clubs or Organizations:   . Attends Archivist Meetings:   [name] Marital Status:   Intimate Partner Violence:   . Fear of Current or Ex-Partner:   . Emotionally Abused:   [name] Physically Abused:   . Sexually Abused:       Family History  Problem Relation Age of Onset  . Stroke Mother   . Diabetes Father   . Colon cancer Neg Hx     Vitals:   06/11/20 1113  BP: 110/70  Pulse: 65  SpO2: 99%  Weight: (!) 101.9 kg (224 lb 9.6 oz)     PHYSICAL EXAM: General:  Well appearing, elderly WM in wheel chair. No respiratory difficulty HEENT: normal Neck: supple. no JVD. Carotids 2+ bilat; no bruits. No lymphadenopathy or thyromegaly appreciated. Cor: PMI nondisplaced. Irreguarlly irregular rhythm, regular rate. No rubs, gallops or murmurs. Lungs: clear Abdomen: soft, nontender, nondistended. No hepatosplenomegaly. No bruits or masses. Good bowel sounds. Extremities: no cyanosis, clubbing, rash, edema Neuro: alert & oriented x 3, cranial nerves grossly intact. moves all 4 extremities w/o difficulty. Affect pleasant.  ECG: chronic atrial fibrillation, 59 bpm    ASSESSMENT & PLAN:  1. Chronic Systolic Heart Failure  - Echo 4/21 LVEF 40%. RV ok. Mild MR - Echo 5/21 LVEF 35-40%. RV ok, moderate MR - Echo 6/21 LVEF 25-40%, RV ok,  moderate MR, prosthetic AV ok  - Recent admission 6/21 for ADHF. RHC demonstrated elevated filling pressures and low CO/CI. PCWP 27 mmHg. RA pressure 13 mmHg. CI 1.6 by FICK, 1.3 by Thermo. Required milrinone to help w/ diuresis.  - volume status stable  post discharge. Euvolemic on exam today. NYHA Class III, chronic  - Continue Lasix 20 mg daily  - Continue bisoprolol 2.5 mg daily.  - Continue 0.125 mg digoxin. Check dig level - Continue spironolactone 12.5 mg qhs - Continue Imdur 15 mg daily  - BP too soft for ARNi/ARB. SBP 110, has had occasional dizziness w/ standing.  - not a candidate for CTR-P per EP  - we discussed daily wts + low sodium diet and fluid restriction - check BMP today   2. CAD - LHC in 2017 showed moderate nonobstructive disease - [facility] 6/21 w/ progressive disease: severe 2VD w/80-90% proximal LAD stenosis involving ostium of D1 (70% D1 stenosis), and sequential 50% ostial and proximal LCx lesions followed by 80% mid/distal LCx stenosis (similar in appearance to 2017). Moderate, diffuse RCA disease similar to 2017. Medical management elected given lack of CP.  - remains stable w/o CP.  - Continue 81 mg asa daily   - Continue statin, Crestor 5 mg daily  - continue bisoprolol 2.5 mg daily    3. Mitral Regurgitation - Echo 02/2020 showed only mild MR - Echo 6/21>> moderate, likely functional    4. H/o Aortic Stenosis w/ TAVR - s/p TAVR 07/2016 - stable prosthesis on recent echo 6/21   5. Carotid Artery Disease - s/p Rt CEA 2017  - last carotid dopplers 2018 showed patent RICA and mod (50-69%) Lt ICA stenosis - asymptomatic - continue medical therapy w/ ASA + statin. BP is well controlled.  - outpatient surveillance per gen cards   6. Chronic Afib - well rate controlled, 59 bpm on EKG  - Continue bisoprolol 2.5 mg daily.   - Continue Coumadin. INRs followed by PCP.  - also on ASA for CAD. Denies abnormal bleeding. Check CBC today   7. T2DM - poorly controlled. Hgb A1c 3/21 was 9.8  - on insulin - managed by PCP  8. Stage III CKD - baseline SCr ~1.3-1.5 - check BMP today   9. Debility and Dementia - At home w/ 24 hr home health nursing care + PT   Followed closely at home by home heath.  24/7 care. Discussed w/ pt and family daily wts for monitor of volume status. Instructed to call if > 3 lb gain in 24 hrs or > 5 lb in 1 week. F/u w/ Dr. Haroldine Laws in ~6 weeks or sooner if needed.    Lyda Jester, PA-C 06/11/20

## 2020-06-14 ENCOUNTER — Other Ambulatory Visit: Payer: Self-pay

## 2020-06-14 ENCOUNTER — Emergency Department (HOSPITAL_COMMUNITY): Payer: PPO

## 2020-06-14 ENCOUNTER — Emergency Department (HOSPITAL_COMMUNITY)
Admission: EM | Admit: 2020-06-14 | Discharge: 2020-06-14 | Disposition: A | Discharge status: Home / Self Care | Payer: PPO | Attending: Emergency Medicine | Admitting: Emergency Medicine

## 2020-06-14 ENCOUNTER — Encounter (HOSPITAL_COMMUNITY): Payer: Self-pay | Admitting: Emergency Medicine

## 2020-06-14 DIAGNOSIS — Z20822 Contact with and (suspected) exposure to covid-19: Secondary | ICD-10-CM | POA: Insufficient documentation

## 2020-06-14 DIAGNOSIS — Z794 Long term (current) use of insulin: Secondary | ICD-10-CM | POA: Insufficient documentation

## 2020-06-14 DIAGNOSIS — E1142 Type 2 diabetes mellitus with diabetic polyneuropathy: Secondary | ICD-10-CM | POA: Diagnosis not present

## 2020-06-14 DIAGNOSIS — Z79899 Other long term (current) drug therapy: Secondary | ICD-10-CM | POA: Diagnosis not present

## 2020-06-14 DIAGNOSIS — G8929 Other chronic pain: Secondary | ICD-10-CM | POA: Diagnosis not present

## 2020-06-14 DIAGNOSIS — I4891 Unspecified atrial fibrillation: Secondary | ICD-10-CM | POA: Diagnosis not present

## 2020-06-14 DIAGNOSIS — J189 Pneumonia, unspecified organism: Secondary | ICD-10-CM | POA: Diagnosis not present

## 2020-06-14 DIAGNOSIS — J9601 Acute respiratory failure with hypoxia: Secondary | ICD-10-CM | POA: Diagnosis not present

## 2020-06-14 DIAGNOSIS — R0689 Other abnormalities of breathing: Secondary | ICD-10-CM | POA: Diagnosis not present

## 2020-06-14 DIAGNOSIS — Z87891 Personal history of nicotine dependence: Secondary | ICD-10-CM | POA: Diagnosis not present

## 2020-06-14 DIAGNOSIS — J45901 Unspecified asthma with (acute) exacerbation: Secondary | ICD-10-CM | POA: Diagnosis not present

## 2020-06-14 DIAGNOSIS — E1122 Type 2 diabetes mellitus with diabetic chronic kidney disease: Secondary | ICD-10-CM | POA: Diagnosis not present

## 2020-06-14 DIAGNOSIS — I5033 Acute on chronic diastolic (congestive) heart failure: Secondary | ICD-10-CM | POA: Diagnosis not present

## 2020-06-14 DIAGNOSIS — I130 Hypertensive heart and chronic kidney disease with heart failure and stage 1 through stage 4 chronic kidney disease, or unspecified chronic kidney disease: Secondary | ICD-10-CM | POA: Insufficient documentation

## 2020-06-14 DIAGNOSIS — I959 Hypotension, unspecified: Secondary | ICD-10-CM | POA: Diagnosis not present

## 2020-06-14 DIAGNOSIS — E1151 Type 2 diabetes mellitus with diabetic peripheral angiopathy without gangrene: Secondary | ICD-10-CM | POA: Diagnosis not present

## 2020-06-14 DIAGNOSIS — N183 Chronic kidney disease, stage 3 unspecified: Secondary | ICD-10-CM | POA: Diagnosis not present

## 2020-06-14 DIAGNOSIS — R0602 Shortness of breath: Secondary | ICD-10-CM | POA: Diagnosis present

## 2020-06-14 DIAGNOSIS — Z7982 Long term (current) use of aspirin: Secondary | ICD-10-CM | POA: Insufficient documentation

## 2020-06-14 DIAGNOSIS — D631 Anemia in chronic kidney disease: Secondary | ICD-10-CM | POA: Diagnosis not present

## 2020-06-14 DIAGNOSIS — M159 Polyosteoarthritis, unspecified: Secondary | ICD-10-CM | POA: Diagnosis not present

## 2020-06-14 DIAGNOSIS — N179 Acute kidney failure, unspecified: Secondary | ICD-10-CM | POA: Diagnosis not present

## 2020-06-14 DIAGNOSIS — I517 Cardiomegaly: Secondary | ICD-10-CM | POA: Diagnosis not present

## 2020-06-14 DIAGNOSIS — I2510 Atherosclerotic heart disease of native coronary artery without angina pectoris: Secondary | ICD-10-CM | POA: Insufficient documentation

## 2020-06-14 DIAGNOSIS — N1832 Chronic kidney disease, stage 3b: Secondary | ICD-10-CM | POA: Diagnosis not present

## 2020-06-14 DIAGNOSIS — R069 Unspecified abnormalities of breathing: Secondary | ICD-10-CM | POA: Diagnosis not present

## 2020-06-14 DIAGNOSIS — R531 Weakness: Secondary | ICD-10-CM | POA: Diagnosis not present

## 2020-06-14 DIAGNOSIS — I4811 Longstanding persistent atrial fibrillation: Secondary | ICD-10-CM | POA: Diagnosis not present

## 2020-06-14 DIAGNOSIS — J4520 Mild intermittent asthma, uncomplicated: Secondary | ICD-10-CM | POA: Diagnosis not present

## 2020-06-14 DIAGNOSIS — I5023 Acute on chronic systolic (congestive) heart failure: Secondary | ICD-10-CM | POA: Diagnosis not present

## 2020-06-14 LAB — CBC WITH DIFFERENTIAL/PLATELET
Basophils Absolute: 0.1 10*3/uL (ref 0.0–0.1)
Basophils Relative: 1 %
Eosinophils Absolute: 3.1 10*3/uL — ABNORMAL HIGH (ref 0.0–0.5)
Eosinophils Relative: 32 %
HCT: 46.8 % (ref 39.0–52.0)
Hemoglobin: 15.2 g/dL (ref 13.0–17.0)
Lymphocytes Relative: 9 %
Lymphs Abs: 1 10*3/uL (ref 0.7–4.0)
MCHC: 32.5 g/dL (ref 30.0–36.0)
MCV: 92.3 fL (ref 80.0–100.0)
Monocytes Absolute: 0.5 10*3/uL (ref 0.1–1.0)
Monocytes Relative: 6 %
Neutro Abs: 4.3 10*3/uL (ref 1.7–7.7)
Neutrophils Relative %: 51 %
Platelets: 209 10*3/uL (ref 150–400)
RBC: 5.07 MIL/uL (ref 4.22–5.81)
RDW: 14.9 % (ref 11.5–15.5)
WBC: 8.4 10*3/uL (ref 4.0–10.5)

## 2020-06-14 LAB — COMPREHENSIVE METABOLIC PANEL
ALT: 62 U/L — ABNORMAL HIGH (ref 0–44)
AST: 42 U/L — ABNORMAL HIGH (ref 15–41)
Albumin: 3.5 g/dL (ref 3.5–5.0)
Alkaline Phosphatase: 106 U/L (ref 38–126)
Anion gap: 9 (ref 5–15)
BUN: 22 mg/dL (ref 8–23)
CO2: 26 mmol/L (ref 22–32)
Calcium: 8.8 mg/dL — ABNORMAL LOW (ref 8.9–10.3)
Chloride: 101 mmol/L (ref 98–111)
Creatinine, Ser: 1.41 mg/dL — ABNORMAL HIGH (ref 0.61–1.24)
GFR calc Af Amer: 52 mL/min — ABNORMAL LOW (ref >60)
GFR calc non Af Amer: 45 mL/min — ABNORMAL LOW (ref >60)
Glucose, Bld: 193 mg/dL — ABNORMAL HIGH (ref 70–99)
Potassium: 4.1 mmol/L (ref 3.5–5.1)
Sodium: 136 mmol/L (ref 135–145)
Total Bilirubin: 0.6 mg/dL (ref 0.3–1.2)
Total Protein: 7.1 g/dL (ref 6.5–8.1)

## 2020-06-14 LAB — PROTIME-INR
INR: 2 — ABNORMAL HIGH (ref 0.8–1.2)
Prothrombin Time: 22.2 seconds — ABNORMAL HIGH (ref 11.4–15.2)

## 2020-06-14 LAB — BRAIN NATRIURETIC PEPTIDE: B Natriuretic Peptide: 78 pg/mL (ref 0.0–100.0)

## 2020-06-14 LAB — TROPONIN I (HIGH SENSITIVITY)
Troponin I (High Sensitivity): 23 ng/L — ABNORMAL HIGH (ref <18)
Troponin I (High Sensitivity): 27 ng/L — ABNORMAL HIGH (ref <18)

## 2020-06-14 LAB — SARS CORONAVIRUS 2 BY RT PCR (HOSPITAL ORDER, PERFORMED IN ~~LOC~~ HOSPITAL LAB): SARS Coronavirus 2: NEGATIVE

## 2020-06-14 MED ORDER — AMOXICILLIN-POT CLAVULANATE 875-125 MG PO TABS
1.0000 | ORAL_TABLET | Freq: Two times a day (BID) | ORAL | 0 refills | Status: DC
Start: 2020-06-14 — End: 2020-06-22

## 2020-06-14 MED ORDER — AMOXICILLIN-POT CLAVULANATE 875-125 MG PO TABS
1.0000 | ORAL_TABLET | Freq: Once | ORAL | Status: AC
  Administered 2020-06-14: 1 via ORAL
  Filled 2020-06-14: qty 1

## 2020-06-14 NOTE — ED Triage Notes (Signed)
Patient orthostatic positive per paramedic.

## 2020-06-14 NOTE — ED Notes (Signed)
Pt ambulated with two staff members (standby assist)- pt has slow steady gait, takes small steps around room- denies SOB- O2 sats range from 92-97%. Dr Regenia Skeeter made aware.

## 2020-06-14 NOTE — ED Triage Notes (Signed)
Patient brought in via EMS from home. Alert and oriented. Airway patent. Patient c/o shortness of breath with productive cough and generalized weakness x3 days. Denies any fevers. Patient showed a-fib on monitor per paramedic. Patient has hx of afib and CHF. No edema noted. Patient not eating or drinking well over past 3 days. Patient given 500ML of NS in route.

## 2020-06-14 NOTE — ED Provider Notes (Signed)
[facility] EMERGENCY DEPARTMENT Provider Note   CSN: [id number] Arrival date & time: 06/14/20  1759     History Chief Complaint  Patient presents with  . Shortness of Breath    [name] [name] is a 86 y.o. male.  HPI 86 year old male presents with shortness of breath and cough.  Ongoing for about 3 days.  He feels generally weak as well.  Cough is productive of yellow sputum.  He denies any fevers.  No chest pain or leg swelling.  No vomiting.  He has received his Covid vaccination several months ago.   Past Medical History:  Diagnosis Date  . Acute on chronic systolic CHF (congestive heart failure) ([place]) 02/09/2017  . Acute respiratory failure with hypoxia ([place])   . AKI (acute kidney injury) ([place]) 08/20/2015  . Altered mental status 02/21/2017  . Aortic stenosis    a. mod-sev by echo 05/2016.  . Asthma   . Asthma exacerbation 08/20/2015  . Atrial fibrillation (HCC)    Paroxysmal; Echo in 2007-normal EF; mild LVH; LA enlargement; mild AS, minimal AI; neg. stress nuclear study in 2008   . BPH (benign prostatic hypertrophy) with urinary obstruction 09/27/2011   Transurethral resection of the prostate in 09/2011   . CAD (coronary artery disease), native coronary artery 06/28/2016  . Cardiogenic shock ([place])   . Carotid stenosis 04/25/2012  . Cerebrovascular disease 07/2010   TIA; carotid ultrasound in 07/2010-significant bilateral plaque without focal internal carotid artery stenosis; MRI -encephalomalacia left temporal and right temporal lobes; small inferior right cerebellar infarct; small vessel disease  . Chronic atrial fibrillation (HCC)    Paroxysmal; Echocardiogram in 2007-normal EF; mild LVH; left atrial enlargement; mild stenosis and minimal AI; negative stress nuclear study in 2008  . Chronic systolic CHF (congestive heart failure) ([place])    a. dx 05/2016 - EF 35-40%, diffuse HK, mod-severe AS, mod gradient, severe AVA VTI likely due to decreased cardiac output in setting of  systolic dysfsunction and significant mitral regurgitation, mild MR, mod-severe MR, severe LAE, mild-mod RV dilation, mild RAE, mild-mod TR, mod PASP 71mmHg.  . CKD (chronic kidney disease), stage II   . Cognitive dysfunction 05/29/2015   I believe that this is related to his age as well as previous stroke   . Congestive heart failure (CHF) ([place]) 02/09/2017  . Degenerative joint disease    feet and legs  . Diabetes mellitus    no insulin; A1c of 6.6 in 2005  . Dizziness    occurs daily,especially in am  . Elevated troponin   . Exertional dyspnea   . Gastroesophageal reflux disease   . Hepatic steatosis   . History of noncompliance with medical treatment   . Hyperlipidemia    adverse reactions to statins and niacin  . Hypertension    Borderline  . Irregular heartbeat   . Mitral regurgitation    a. mod-sev by echo 05/2016  . Peripheral vascular disease ([place])   . Renal insufficiency   . S/P TAVR (transcatheter aortic valve replacement) 07/19/2016   29 mm Edwards Sapien 3 transcatheter heart valve placed via left percutaneous transfemoral approach  . Senile purpura ([place]) 08/15/2018  . Temporal arteritis ([place])   . Tricuspid regurgitation    a. mild-mod TR by echo 05/2016  . VT (ventricular tachycardia) [facility])     Patient Active Problem List   Diagnosis Date Noted  . CKD stage 3 due to type 2 diabetes mellitus ([place]) 05/15/2020  . Diabetic peripheral neuropathy associated with type  2 diabetes mellitus ([place]) 05/15/2020  . Hyperlipidemia associated with type 2 diabetes mellitus ([place]) 05/15/2020  . Dementia without behavioral disturbance ([place]) 05/15/2020  . Hypokalemia 05/15/2020  . Chronic insomnia 05/15/2020  . Acute on chronic HFrEF (heart failure with reduced ejection fraction) ([place]) 05/11/2020  . Acute on chronic systolic heart failure, NYHA class 3 ([place]) 05/11/2020  . Senile purpura ([place]) 08/15/2018  . Renal insufficiency 03/14/2018  . Altered mental status 02/21/2017  . Chronic  systolic heart failure ([place]) 02/09/2017  . Controlled type 2 diabetes mellitus with hyperglycemia ([place]) 02/09/2017  . Elevated troponin   . S/P TAVR (transcatheter aortic valve replacement) 07/19/2016  . Mitral regurgitation   . VT (ventricular tachycardia) ([place])   . Acute respiratory failure with hypoxia ([place])   . Shortness of breath 06/28/2016  . CAD (coronary artery disease), native coronary artery 06/28/2016  . Acute on chronic combined systolic and diastolic CHF, NYHA class 4 ([place])   . Anemia of chronic disease 04/12/2016  . Orthostatic hypotension 08/20/2015  . AKI (acute kidney injury) ([place]) 08/20/2015  . Chronic asthma 08/20/2015  . Cognitive dysfunction 05/29/2015  . Chronic back pain 07/29/2014  . Lumbar pain 06/23/2014  . Chronic pain of right ankle 05/30/2014  . Diabetic neuropathy ([place]) 05/30/2014  . Chest congestion 05/02/2013  . Wheezing 05/02/2013  . Chronic constipation 04/30/2013  . Leg edema, left 04/27/2013  . Current use of long term anticoagulation 02/16/2013  . Carotid stenosis 04/25/2012  . BPH (benign prostatic hypertrophy) with urinary obstruction 09/27/2011  . Dyspnea on exertion 08/01/2011  . DM (diabetes mellitus), type 2 with renal complications ([place]) 51/88/4166  . Hypertension   . Atrial fibrillation ([place])   . Hyperlipidemia   . History of noncompliance with medical treatment   . Aortic stenosis   . Cerebrovascular disease 07/15/2010    Past Surgical History:  Procedure Laterality Date  . COLONOSCOPY  2002  . COLONOSCOPY  01/19/2012   Procedure: COLONOSCOPY;  Surgeon: Rogene Houston, MD;  Location[facility];  Service: Endoscopy;  Laterality: N/A;  1030  . LIPOMA EXCISION  1980  . ORIF ANKLE FRACTURE  2000   Right  . PERIPHERAL VASCULAR CATHETERIZATION N/A 07/11/2016   Procedure: Carotid PTA/Stent Intervention;  Surgeon: Lorretta Harp, MD;  Location: [place] CV LAB;  Service: Cardiovascular;  Laterality: N/A;  . PROSTATE SURGERY   12/2011  . RIGHT HEART CATH AND CORONARY ANGIOGRAPHY N/A 05/11/2020   Procedure: RIGHT HEART CATH AND CORONARY ANGIOGRAPHY;  Surgeon: Nelva Bush, MD;  Location: [place] CV LAB;  Service: Cardiovascular;  Laterality: N/A;  . ROTATOR CUFF REPAIR     Right  . TEE WITHOUT CARDIOVERSION N/A 06/17/2016   Procedure: TRANSESOPHAGEAL ECHOCARDIOGRAM (TEE);  Surgeon: Jerline Pain, MD;  Location: [place];  Service: Cardiovascular;  Laterality: N/A;  . TEE WITHOUT CARDIOVERSION N/A 07/19/2016   Procedure: TRANSESOPHAGEAL ECHOCARDIOGRAM (TEE);  Surgeon: Sherren Mocha, MD;  Location: [place];  Service: Open Heart Surgery;  Laterality: N/A;  . TRANSCATHETER AORTIC VALVE REPLACEMENT, TRANSFEMORAL N/A 07/19/2016   Procedure: TRANSCATHETER AORTIC VALVE REPLACEMENT, TRANSFEMORAL;  Surgeon: Sherren Mocha, MD;  Location: [place];  Service: Open Heart Surgery;  Laterality: N/A;  . TRANSURETHRAL RESECTION OF PROSTATE  09/2011  . URETHRAL STRICTURE DILATATION  1980s       Family History  Problem Relation Age of Onset  . Stroke Mother   . Diabetes Father   . Colon cancer Neg Hx     Social History  Tobacco Use  . Smoking status: Former Smoker    Packs/day: 1.00    Years: 20.00    Pack years: 20.00    Types: Cigarettes    Start date: 07/04/1950    Quit date: 04/25/1992    Years since quitting: 28.1  . Smokeless tobacco: Current User    Types: Chew  Substance Use Topics  . Alcohol use: No    Alcohol/week: 0.0 standard drinks  . Drug use: No    Home Medications Prior to Admission medications   Medication Sig Start Date End Date Taking? Authorizing Provider  albuterol (PROVENTIL) (2.5 MG/3ML) 0.083% nebulizer solution Take 3 mLs (2.5 mg total) by nebulization every 6 (six) hours as needed for wheezing or shortness of breath. 05/29/20   Gerlene Fee, NP  ALPRAZolam Duanne Moron) 0.25 MG tablet Take 1 tablet (0.25 mg total) by mouth 3 (three) times daily as needed for anxiety. 06/11/20 06/11/21   Lyda Jester M, PA-C  amoxicillin-clavulanate (AUGMENTIN) 875-125 MG tablet Take 1 tablet by mouth 2 (two) times daily. One po bid x 7 days 06/14/20   Sherwood Gambler, MD  aspirin EC 81 MG EC tablet Take 1 tablet (81 mg total) by mouth daily. Swallow whole. 05/15/20   Lyda Jester M, PA-C  bisoprolol (ZEBETA) 5 MG tablet Take 0.5 tablets (2.5 mg total) by mouth daily. 05/29/20   Gerlene Fee, NP  cholecalciferol (VITAMIN D3) 25 MCG (1000 UNIT) tablet Take 1,000 Units by mouth in the morning and at bedtime.    [provider]  digoxin (LANOXIN) 0.125 MG tablet Take 0.5 tablets (0.0625 mg total) by mouth daily. 06/11/20   Lyda Jester M, PA-C  furosemide (LASIX) 20 MG tablet Take 1 tablet (20 mg total) by mouth daily. 05/29/20   Gerlene Fee, NP  insulin glargine (LANTUS SOLOSTAR) 100 UNIT/ML Solostar Pen Inject 28 Units into the skin every evening. 05/29/20   Gerlene Fee, NP  isosorbide mononitrate (IMDUR) 30 MG 24 hr tablet Take 15 mg by mouth daily. 05/26/20   [provider]  Melatonin 10 MG TABS Take 10 mg by mouth at bedtime.    [provider]  NON FORMULARY Diet: _x____ Regular, ______ NAS, ____x___Consistent Carbohydrate, _______NPO _____Other 05/15/20   [provider]  NON FORMULARY Diet - Liquids: _x__Regular; ___Thickened ___ Consistency: ___ Nectar, ___Honey, ___ Pudding; ____ Fluid Restriction 05/15/20   [provider]  nortriptyline (PAMELOR) 10 MG capsule Take 1 capsule (10 mg total) by mouth at bedtime. For burning in feet 05/29/20   Nyoka Cowden, Phylis Bougie, NP  psyllium (METAMUCIL) 58.6 % powder Take 1 packet by mouth daily.    [provider]  rosuvastatin (CRESTOR) 5 MG tablet TAKE (1) TABLET BY MOUTH ONCE DAILY AT BEDTIME. 05/29/20   Gerlene Fee, NP  warfarin (COUMADIN) 3 MG tablet Take 1 tablet (3 mg total) by mouth daily at 4 PM. Every Sun,Tues, Thurs. 05/29/20   Gerlene Fee, NP  warfarin (COUMADIN) 6  MG tablet Take 1 tablet (6 mg total) by mouth every Monday, Wednesday, Friday, and Saturday at 6 PM. 05/29/20   Nyoka Cowden, Phylis Bougie, NP    Allergies    Ambien [zolpidem tartrate], Lipitor [atorvastatin calcium], Ranitidine, Simvastatin, Xanax xr [alprazolam er], Cholestatin, and Clopidogrel bisulfate  Review of Systems   Review of Systems  Constitutional: Positive for fatigue. Negative for fever.  Respiratory: Positive for cough and shortness of breath.   Cardiovascular: Negative for chest pain and leg swelling.  Gastrointestinal: Negative for vomiting.  All other systems reviewed and are negative.   Physical Exam Updated Vital Signs BP 117/79   Pulse 87   Temp 97.6 F (36.4 C) (Oral)   Resp 17   Ht 6\' 1"  (1.854 m)   Wt (!) 97.1 kg   SpO2 96%   BMI 28.23 kg/m   Physical Exam Vitals and nursing note reviewed.  Constitutional:      General: He is not in acute distress.    Appearance: He is well-developed. He is not ill-appearing or diaphoretic.  HENT:     Head: Normocephalic and atraumatic.     Right Ear: External ear normal.     Left Ear: External ear normal.     Nose: Nose normal.  Eyes:     General:        Right eye: No discharge.        Left eye: No discharge.  Cardiovascular:     Rate and Rhythm: Normal rate and regular rhythm.     Heart sounds: Normal heart sounds.  Pulmonary:     Effort: Pulmonary effort is normal. No tachypnea or accessory muscle usage.     Breath sounds: Rales (mild, at the bases) present.  Abdominal:     Palpations: Abdomen is soft.     Tenderness: There is no abdominal tenderness.  Musculoskeletal:     Cervical back: Neck supple.     Right lower leg: No edema.     Left lower leg: No edema.  Skin:    General: Skin is warm and dry.  Neurological:     Mental Status: He is alert.  Psychiatric:        Mood and Affect: Mood is not anxious.     ED Results / Procedures / Treatments   Labs (all labs ordered are listed, but only abnormal  results are displayed) Labs Reviewed  COMPREHENSIVE METABOLIC PANEL - Abnormal; Notable for the following components:      Result Value   Glucose, Bld 193 (*)    Creatinine, Ser 1.41 (*)    Calcium 8.8 (*)    AST 42 (*)    ALT 62 (*)    GFR calc non Af Amer 45 (*)    GFR calc Af Amer 52 (*)    All other components within normal limits  CBC WITH DIFFERENTIAL/PLATELET - Abnormal; Notable for the following components:   Eosinophils Absolute 3.1 (*)    All other components within normal limits  PROTIME-INR - Abnormal; Notable for the following components:   Prothrombin Time 22.2 (*)    INR 2.0 (*)    All other components within normal limits  TROPONIN I (HIGH SENSITIVITY) - Abnormal; Notable for the following components:   Troponin I (High Sensitivity) 23 (*)    All other components within normal limits  TROPONIN I (HIGH SENSITIVITY) - Abnormal; Notable for the following components:   Troponin I (High Sensitivity) 27 (*)    All other components within normal limits  SARS CORONAVIRUS 2 BY RT PCR [facility] ORDER, [place] [facility])  BRAIN NATRIURETIC PEPTIDE    EKG EKG Interpretation  Date/Time:  Sunday June 14 2020 18:32:26 EDT Ventricular Rate:  63 PR Interval:    QRS Duration: 133 QT Interval:  398 QTC Calculation: 408 R Axis:   -45 Text Interpretation: Atrial fibrillation Left bundle branch block Confirmed by Sherwood Gambler [phone]) on 06/14/2020 8:16:07 PM   Radiology DG Chest Portable 1 View  Result  Date: 06/14/2020 CLINICAL DATA:  Cough, dyspnea, alert and oriented EXAM: PORTABLE CHEST 1 VIEW COMPARISON:  Radiograph 05/11/2020, CT 02/21/2017 FINDINGS: Prior TAVR in stable position. Cardiomediastinal contours are similar to prior counting for differences in technique. The aorta is calcified. The remaining cardiomediastinal contours are unremarkable. There are chronically coarsened interstitial changes. Some more focal patchy opacity in the right  infrahilar lung could reflect atelectasis or vascular crowding though early consolidation is not excluded. Lungs are otherwise clear. No pneumothorax or visible effusion. No acute osseous or soft tissue abnormality. IMPRESSION: Patchy right infrahilar opacity, possibly atelectasis/vascular crowding though early consolidation is not excluded. Chronically coarsened interstitial changes. Stable positioning of a TAVR with unchanged mild cardiomegaly. The aorta is calcified. The remaining cardiomediastinal contours are unremarkable. Electronically Signed   By: Lovena Le M.D.   On: 06/14/2020 19:30    Procedures Procedures (including critical care time)  Medications Ordered in ED Medications  amoxicillin-clavulanate (AUGMENTIN) 875-125 MG per tablet 1 tablet (1 tablet Oral Given 06/14/20 2215)    ED Course  I have reviewed the triage vital signs and the nursing notes.  Pertinent labs & imaging results that were available during my care of the patient were reviewed by me and considered in my medical decision making (see chart for details).    MDM Rules/Calculators/A&P                          Patient is well-appearing here.  He is not having any increased work of breathing or hypoxia.  X-ray shows possible infiltrate.  My suspicion of ACS, PE, dissection is pretty low.  He does have some mildly elevated troponins but they are flat.  My suspicion of ACS again is pretty low and so I do not think he needs to be admitted.  He would like to go home which I think is reasonable.  I discussed with pharmacy given his Coumadin therapy and they recommend Augmentin and follow-up as an outpatient to get INR checked.  Otherwise, he ambulated without hypoxia or increased work of breathing.  Discharged home with return precautions. Final Clinical Impression(s) / ED Diagnoses Final diagnoses:  Community acquired pneumonia of right lung, unspecified part of lung    Rx / DC Orders ED Discharge Orders          Ordered    amoxicillin-clavulanate (AUGMENTIN) 875-125 MG tablet  2 times daily     Discontinue  Reprint     06/14/20 2229           Sherwood Gambler, MD 06/14/20 2343

## 2020-06-14 NOTE — Discharge Instructions (Signed)
If you develop fever, coughing up blood, trouble breathing, chest pain, vomiting, or any other new/concerning symptoms then return to the ER for evaluation.  You are being prescribed Augmentin, which can change the effectiveness of your Coumadin/warfarin.  It is important to know that your doctor know about this so they can continue to check your INR

## 2020-06-15 ENCOUNTER — Ambulatory Visit (HOSPITAL_COMMUNITY)
Admission: RE | Admit: 2020-06-15 | Discharge: 2020-06-15 | Disposition: A | Discharge status: Home / Self Care | Payer: PPO | Source: Ambulatory Visit | Attending: Cardiology | Admitting: Cardiology

## 2020-06-15 DIAGNOSIS — I5022 Chronic systolic (congestive) heart failure: Secondary | ICD-10-CM | POA: Diagnosis not present

## 2020-06-15 LAB — DIGOXIN LEVEL: Digoxin Level: 0.7 ng/mL — ABNORMAL LOW (ref 0.8–2.0)

## 2020-06-15 LAB — BASIC METABOLIC PANEL
Anion gap: 10 (ref 5–15)
BUN: 20 mg/dL (ref 8–23)
CO2: 26 mmol/L (ref 22–32)
Calcium: 9.5 mg/dL (ref 8.9–10.3)
Chloride: 97 mmol/L — ABNORMAL LOW (ref 98–111)
Creatinine, Ser: 1.49 mg/dL — ABNORMAL HIGH (ref 0.61–1.24)
GFR calc Af Amer: 49 mL/min — ABNORMAL LOW (ref >60)
GFR calc non Af Amer: 42 mL/min — ABNORMAL LOW (ref >60)
Glucose, Bld: 242 mg/dL — ABNORMAL HIGH (ref 70–99)
Potassium: 4.4 mmol/L (ref 3.5–5.1)
Sodium: 133 mmol/L — ABNORMAL LOW (ref 135–145)

## 2020-06-15 LAB — URINALYSIS, ROUTINE W REFLEX MICROSCOPIC
Bacteria, UA: NONE SEEN
Bilirubin Urine: NEGATIVE
Glucose, UA: >=500 mg/dL — AB
Ketones, ur: NEGATIVE mg/dL
Leukocytes,Ua: NEGATIVE
Nitrite: NEGATIVE
Protein, ur: NEGATIVE mg/dL
Specific Gravity, Urine: 1.028 (ref 1.005–1.030)
pH: 5 (ref 5.0–8.0)

## 2020-06-15 LAB — BRAIN NATRIURETIC PEPTIDE: B Natriuretic Peptide: 88.9 pg/mL (ref 0.0–100.0)

## 2020-06-15 NOTE — Addendum Note (Signed)
Encounter addended by: Shonna Chock, CMA on: 06/14/1885 3:03 PM  Actions taken: Order list changed, Diagnosis association updated

## 2020-06-16 ENCOUNTER — Telehealth (INDEPENDENT_AMBULATORY_CARE_PROVIDER_SITE_OTHER): Payer: PPO | Admitting: Family Medicine

## 2020-06-16 ENCOUNTER — Other Ambulatory Visit: Payer: Self-pay

## 2020-06-16 ENCOUNTER — Telehealth: Payer: Self-pay | Admitting: Family Medicine

## 2020-06-16 DIAGNOSIS — E119 Type 2 diabetes mellitus without complications: Secondary | ICD-10-CM | POA: Diagnosis not present

## 2020-06-16 DIAGNOSIS — G301 Alzheimer's disease with late onset: Secondary | ICD-10-CM

## 2020-06-16 DIAGNOSIS — F0281 Dementia in other diseases classified elsewhere with behavioral disturbance: Secondary | ICD-10-CM

## 2020-06-16 DIAGNOSIS — F02818 Dementia in other diseases classified elsewhere, unspecified severity, with other behavioral disturbance: Secondary | ICD-10-CM

## 2020-06-16 MED ORDER — OLANZAPINE 2.5 MG PO TABS: ORAL_TABLET | ORAL | 0 refills | Status: DC

## 2020-06-16 NOTE — Telephone Encounter (Signed)
Pt niece contacted and verbalized understanding. Pt niece states a phone visit will be fine. Pt placed on schedule.

## 2020-06-16 NOTE — Telephone Encounter (Signed)
Pt's niece is calling stating pt has dementia and they know that but in the evenings he is becoming combative. She is wanting to know what they need to do or if there is medication that could help. She states when he gets something on his mind he is going to do it wether it could hurt him or someone else.

## 2020-06-16 NOTE — Progress Notes (Addendum)
   Subjective:    Patient ID: [name] [name], male    DOB: [birth year], 86 y.o.   MRN: [id number] I connected with  [name] [name] on 08/03/20 by a video enabled telemedicine application and verified that I am speaking with the correct person using two identifiers.   I discussed the limitations of evaluation and management by telemedicine. The patient expressed understanding and agreed to proceed.  Virtual Visit via Video Note  I connected with [name] [name] on 08/03/20 at  4:10 PM EDT by a video enabled telemedicine application and verified that I am speaking with the correct person using two identifiers.  Location: Patient: Home Provider: Office   I discussed the limitations of evaluation and management by telemedicine and the availability of in person appointments. The patient expressed understanding and agreed to proceed.  History of Present Illness:    Observations/Objective:   Assessment and Plan:   Follow Up Instructions:    I discussed the assessment and treatment plan with the patient. The patient was provided an opportunity to ask questions and all were answered. The patient agreed with the plan and demonstrated an understanding of the instructions.   The patient was advised to call back or seek an in-person evaluation if the symptoms worsen or if the condition fails to improve as anticipated.  I provided 20 minutes of non-face-to-face time during this encounter.   Sallee Lange, MD   HPI Patient niece calls today to discuss patients dementia and him becoming combative and relentless in the evenings for the past couple of weeks and getting worse.   all phone consultation with niece and patient regarding his dementia he becomes combative and relentless in the evenings very difficult to get along with.  In addition to this having difficult time following family's direction.  It makes it difficult for them to take care of him they are trying to keep him at home as much  as possible he has took off across the road to a neighbor's house and they have done their best to prevent this from happening they have an attendant with him at all times he has had ongoing progressive dementia and behavioral issues over the past several weeks worse over the past couple weeks Review of Systems     Objective:   Physical Exam  Today's visit was via telephone Physical exam was not possible for this visit       Assessment & Plan:  Dementia with behavioral disturbances We will go ahead with Zyprexa started off with 2.5 mg in the evening then after several days increase it to 2.5 mg twice daily We will reassess again early next week on how things are going I did discuss with the niece help use of antipsychotics is associated with increased risk of stroke but they are in a situation where control of this situation is necessary to lessen the risk of being placed into a nursing home.

## 2020-06-16 NOTE — Telephone Encounter (Signed)
I would recommend that we do a phone consultation with the niece I can do this today at 4:10 PM (please let them know it could be as late as 30 but we can do all this by phone-there is not a simple straightforward answer to her question)

## 2020-06-17 ENCOUNTER — Telehealth: Payer: Self-pay | Admitting: Family Medicine

## 2020-06-17 NOTE — Telephone Encounter (Signed)
[facility] is requesting a prescription for tub bench and side rail for bed for patient called into [place]

## 2020-06-17 NOTE — Telephone Encounter (Signed)
Please do so

## 2020-06-18 NOTE — Telephone Encounter (Signed)
Script faxed and [name] [name] is aware.

## 2020-06-22 ENCOUNTER — Other Ambulatory Visit: Payer: Self-pay | Admitting: Adult Health

## 2020-06-22 ENCOUNTER — Ambulatory Visit (INDEPENDENT_AMBULATORY_CARE_PROVIDER_SITE_OTHER): Payer: PPO | Admitting: Family Medicine

## 2020-06-22 ENCOUNTER — Other Ambulatory Visit: Payer: Self-pay | Admitting: Family Medicine

## 2020-06-22 ENCOUNTER — Other Ambulatory Visit: Payer: Self-pay

## 2020-06-22 DIAGNOSIS — F0281 Dementia in other diseases classified elsewhere with behavioral disturbance: Secondary | ICD-10-CM

## 2020-06-22 DIAGNOSIS — G301 Alzheimer's disease with late onset: Secondary | ICD-10-CM

## 2020-06-22 DIAGNOSIS — F02818 Dementia in other diseases classified elsewhere, unspecified severity, with other behavioral disturbance: Secondary | ICD-10-CM

## 2020-06-22 NOTE — Progress Notes (Signed)
   Subjective:    Patient ID: [name] [name], male    DOB: Jan 29, 1932, 86 y.o.   MRN: [id number]  HPI Phone consultation his niece is with patient Dementia with behavioral disturbance Taking Zyprexa currently Family member state that this is actually made a big difference He is less aggressive more agreeable less argumentative.  Less hallucinations tolerating medicine well doing well at home  Family states they are holding the Xanax because that combined with Zyprexa makes him sleep too much Review of Systems See above    Objective:   Physical Exam  Today's visit was via telephone Physical exam was not possible for this visit  Please keep follow-up next week     Assessment & Plan:  Behavioral disturbances dementia doing much better on Zyprexa continue this follow-up if progressive troubles or if worse warning signs discussed in detail

## 2020-06-24 ENCOUNTER — Institutional Professional Consult (permissible substitution): Payer: PPO | Admitting: Emergency Medicine

## 2020-06-30 ENCOUNTER — Other Ambulatory Visit: Payer: Self-pay

## 2020-06-30 ENCOUNTER — Ambulatory Visit (INDEPENDENT_AMBULATORY_CARE_PROVIDER_SITE_OTHER): Payer: PPO | Admitting: Family Medicine

## 2020-06-30 ENCOUNTER — Encounter: Payer: Self-pay | Admitting: Family Medicine

## 2020-06-30 VITALS — BP 124/76 | Temp 97.4°F | Wt 223.6 lb

## 2020-06-30 DIAGNOSIS — E1121 Type 2 diabetes mellitus with diabetic nephropathy: Secondary | ICD-10-CM

## 2020-06-30 DIAGNOSIS — F0281 Dementia in other diseases classified elsewhere with behavioral disturbance: Secondary | ICD-10-CM | POA: Diagnosis not present

## 2020-06-30 DIAGNOSIS — I10 Essential (primary) hypertension: Secondary | ICD-10-CM | POA: Diagnosis not present

## 2020-06-30 DIAGNOSIS — Z7901 Long term (current) use of anticoagulants: Secondary | ICD-10-CM | POA: Diagnosis not present

## 2020-06-30 DIAGNOSIS — G301 Alzheimer's disease with late onset: Secondary | ICD-10-CM | POA: Diagnosis not present

## 2020-06-30 DIAGNOSIS — I4811 Longstanding persistent atrial fibrillation: Secondary | ICD-10-CM | POA: Diagnosis not present

## 2020-06-30 DIAGNOSIS — Z794 Long term (current) use of insulin: Secondary | ICD-10-CM | POA: Diagnosis not present

## 2020-06-30 DIAGNOSIS — D692 Other nonthrombocytopenic purpura: Secondary | ICD-10-CM

## 2020-06-30 DIAGNOSIS — N1832 Chronic kidney disease, stage 3b: Secondary | ICD-10-CM | POA: Diagnosis not present

## 2020-06-30 DIAGNOSIS — F02818 Dementia in other diseases classified elsewhere, unspecified severity, with other behavioral disturbance: Secondary | ICD-10-CM | POA: Insufficient documentation

## 2020-06-30 LAB — POCT INR: INR: 2.6 (ref 2.0–3.0)

## 2020-06-30 NOTE — Patient Instructions (Signed)
Keep Coumadin dose same. Recheck INR in one month. Office visit in 3 months

## 2020-06-30 NOTE — Progress Notes (Signed)
Subjective:    Patient ID: [name] [name], male    DOB: Feb 10, 1932, 86 y.o.   MRN: [id number]  HPI Pt here for follow up and INR. Pt family and caregiver state he is doing OK.   Results for orders placed or performed in visit on 06/30/20  POCT INR  Result Value Ref Range   INR 2.6 2.0 - 3.0   Patient here to follow-up regarding blood pressure regarding INR regarding his glucose and blood pressure as well as dementia and behavioral issues Medication is making a difference he is more attentive and less argumentative.  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  HENT: Negative for congestion and rhinorrhea.   Respiratory: Negative for cough and shortness of breath.   Cardiovascular: Negative for chest pain and leg swelling.  Gastrointestinal: Negative for abdominal pain and diarrhea.  Skin: Negative for color change and rash.  Neurological: Negative for dizziness and headaches.  Psychiatric/Behavioral: Negative for behavioral problems and confusion.       Objective:   Physical Exam Vitals reviewed.  Constitutional:      General: He is not in acute distress. HENT:     Head: Normocephalic and atraumatic.  Eyes:     General:        Right eye: No discharge.        Left eye: No discharge.  Neck:     Trachea: No tracheal deviation.  Cardiovascular:     Rate and Rhythm: Normal rate. Rhythm irregular.     Heart sounds: Normal heart sounds. No murmur heard.   Pulmonary:     Effort: Pulmonary effort is normal. No respiratory  distress.     Breath sounds: Normal breath sounds.  Lymphadenopathy:     Cervical: No cervical adenopathy.  Skin:    General: Skin is warm and dry.  Neurological:     Mental Status: He is alert.     Coordination: Coordination normal.  Psychiatric:        Behavior: Behavior normal.           Assessment & Plan:  1. Encounter for current long-term use of anticoagulants INR looks good continue current measures repeat again in 1 month no bleeding issues - POCT INR  2. Essential hypertension Blood pressure good control continue current measures.  Minimize salt  3. Longstanding persistent atrial fibrillation (HCC) Heart rate under good control.  Under anticoagulation  4. Senile purpura (HCC) Bruising noted on the arms stable  5. Type 2 diabetes mellitus with stage 3b chronic kidney disease, with long-term current use of insulin (HCC) Diabetes family has had to go up to 32 units no longer using Metformin they will keep [place] posted on how things are going  6. Late onset Alzheimer's disease with behavioral disturbance ([place]) Zyprexa was necessary to help him with behavioral disturbances he frequently asked about driving I informed him in the family he should not drive  Follow-up here in 3 months  30 minutes was spent with the patient including previsit chart review, time spent with patient,  discussion of health issues, review of data including medical record, and documentation of the visit.

## 2020-07-02 ENCOUNTER — Other Ambulatory Visit: Payer: Self-pay

## 2020-07-02 ENCOUNTER — Telehealth: Payer: Self-pay | Admitting: Family Medicine

## 2020-07-02 ENCOUNTER — Ambulatory Visit (HOSPITAL_COMMUNITY)
Admission: RE | Admit: 2020-07-02 | Discharge: 2020-07-02 | Disposition: A | Discharge status: Home / Self Care | Payer: PPO | Source: Ambulatory Visit | Attending: Internal Medicine | Admitting: Internal Medicine

## 2020-07-02 DIAGNOSIS — F0390 Unspecified dementia without behavioral disturbance: Secondary | ICD-10-CM | POA: Diagnosis not present

## 2020-07-02 DIAGNOSIS — I340 Nonrheumatic mitral (valve) insufficiency: Secondary | ICD-10-CM | POA: Diagnosis not present

## 2020-07-02 DIAGNOSIS — I130 Hypertensive heart and chronic kidney disease with heart failure and stage 1 through stage 4 chronic kidney disease, or unspecified chronic kidney disease: Secondary | ICD-10-CM | POA: Insufficient documentation

## 2020-07-02 DIAGNOSIS — I5022 Chronic systolic (congestive) heart failure: Secondary | ICD-10-CM | POA: Diagnosis not present

## 2020-07-02 DIAGNOSIS — E1122 Type 2 diabetes mellitus with diabetic chronic kidney disease: Secondary | ICD-10-CM | POA: Diagnosis not present

## 2020-07-02 DIAGNOSIS — Z952 Presence of prosthetic heart valve: Secondary | ICD-10-CM | POA: Diagnosis not present

## 2020-07-02 DIAGNOSIS — I4891 Unspecified atrial fibrillation: Secondary | ICD-10-CM | POA: Insufficient documentation

## 2020-07-02 DIAGNOSIS — I2510 Atherosclerotic heart disease of native coronary artery without angina pectoris: Secondary | ICD-10-CM | POA: Diagnosis not present

## 2020-07-02 DIAGNOSIS — N183 Chronic kidney disease, stage 3 unspecified: Secondary | ICD-10-CM | POA: Insufficient documentation

## 2020-07-02 MED ORDER — SPIRONOLACTONE 25 MG PO TABS
12.5000 mg | ORAL_TABLET | Freq: Every day | ORAL | 11 refills | Status: DC
Start: 2020-07-02 — End: 2021-07-06

## 2020-07-02 NOTE — Telephone Encounter (Signed)
[name] stop by regarding [employer], Pt just come from the heart clinic. [name] ask the heart doctor did Bank need to go back on something for his diabetes the two meds she was inquiring about was [place] or Jardiance helpful for heart failure and diabetes. Also the pt is needing a refill on ALPRAZolam Duanne Moron) 0.25 MG tablet [[id number] sent to  [place], [place] call back [phone]

## 2020-07-02 NOTE — Progress Notes (Signed)
PCP: Kathyrn Drown, MD PCP-Cardiologist: Sherren Mocha, MD  AHF: Dr. Haroldine Laws   HPI:   86 y/o male, widower (wife died 2 years ago),with DM2, HTN, CKD, CHF with priorEF 40%, CAD,AS s/p TAVR in 2017 and dementia, who was referred for diagnostic [facility] for worsening dyspnea.   R/LHC on 05/11/20 demonstrated elevated filling pressures c/w volume overload,low output HF and significant 2v CAD. CI1.6 by Fick calculation. 1.3 by Thermodilution. PCWP 27 mmHg.Angiography showed80-90% proximal LAD stenosis involving ostium of D1 (70% D1 stenosis), and sequential 50% ostial and proximal LCx lesions followed by 80% mid/distal LCx stenosis (similar in appearance to 2017).Moderate, diffuse RCA disease similar to 2017.  He was directly admitted from the cath lab and started on milrinone, IV furosemide and IV heparin. Echo was repeated and demonstrated LVEF 25-40%(lower than prior study), RV ok, moderate MR, prosthetic AV ok. He diuresed well and hemodynamics improved on milrinone. Transitioned off milrinone and placed back on oral diuretics. EP evaluated for potential CRT pacing but he was deemed not a candidate givenQRS duration, advanced age and AFib. Case also discussed with interventionalist and it was decided not to intervene on the LAD given lack of CP. Will continue to optimize medically. He was treated with ASA, statin,betablocker as well as spironolactone and digoxin for HF. BP too soft for ARNI/ARB.Coumadin resumed for AF.  PT/OT evaluated and recommended SNF. He was discharged to SNF on 05/14/20.   Presented to clinic 06/11/20 for post hospital follow-up with his family (nephew and niece). He had completed therapy at SNF and was back at home with 24 hr nursing care and home PT.  Weight was up slightly from discharge weight, 217>>224 lb but appetite improved since leaving the hospital. No chest pain. Euvolemic on exam. Continued with NYHA III symptoms, chronic. Ambulated with walker. No  resting symptoms. Sleeps with only 1 pillow. No orthopnea/PND. Was tolerating medications ok. He did note slight dizziness upon standing but quickly resolved. No syncope/near syncope. BP was stable at 110/70. EKG showed chronic rate controlled afib at 59 bpm. QT/QTc ok 404/399 ms. No abnormal bleeding with coumadin. No falls.    Admitted to ED 06/14/20 with SOB, productive cough, weakness x3 days. R/o ACS (mildly elevated troponins but flat). Xray showed possible infiltrate and was treated with Augmentin for CAP.  Today he returns to HF clinic in a wheelchair for pharmacist medication titration with home nurse and niece. At last visit with MD, BNP level was slightly elevated from baseline and digoxin level was high at 1.1 ng/mL. Instructed to take an extra dose of furosemide 20 mg and to hold digoxin for 2 days then decrease digoxin to 0.0625 mg daily. Additionally, potassium level was elevated at 5.5 so potassium supplements and spironolactone were discontinued. Of note, after decision was made to discontinue these medications it was noted that the labs were hemolyzed. Overall doing well today. Home nurse endorses dizziness in morning and throughout the day that improves with time and breathing exercises. Nurse also reports fatigue/lack of energy, but says its more reminiscent of depression symptoms. No chest pain or palpitations. Nurse reports SOB periodically when he overexerts himself, but subsides with rest. Daily home weights stable, ranging from 215 -217 lbs on furosemide 20 mg daily. No LEE on exam. No PND or orthopnea. No appetite change, nurse endorses adherence to low salt diet. No problems obtaining or tolerating medications.  HF Medications: Bisoprolol 2.5 mg daily Imdur 15 mg daily Digoxin 0.0625 mg daily Furosemide 20 mg daily  Has the patient been experiencing any side effects to the medications prescribed?  no  Does the patient have any problems obtaining medications due to  transportation or finances?   No - [employer] (Medicare) prescription coverage  Understanding of regimen: good Understanding of indications: good Potential of compliance: good Patient understands to avoid NSAIDs. Patient understands to avoid decongestants.    Pertinent Lab Values: 06/15/20 . Serum creatinine 1.49, BUN 20, Potassium 4.4, Sodium 133, BNP 88.9 pg/mL, Digoxin 0.7 ng/mL   Vital Signs: . Weight: 223.8 lbs (last clinic weight: 224 lbs) . Blood pressure: 152/78 (home BP this morning 144/76)  . Heart rate: 76 bpm  Assessment: 1. Chronic Systolic Heart Failure  - Echo 4/21 LVEF 40%. RV ok. Mild MR - Echo 5/21 LVEF 35-40%. RV ok, moderate MR - Echo 6/21 LVEF 25-40%, RV ok, moderate MR, prosthetic AV ok - Recent admission 6/21 for ADHF. RHC demonstrated elevated filling pressures and low CO/CI. PCWP 27 mmHg. RA pressure 13 mmHg. CI 1.6 by FICK, 1.3 by Thermo. Required milrinone to help w/ diuresis.  - NYHA Class III, chronic. Euvolemic on exam today.  - Continue furosemide 20 mg daily  -ContinueBisoprolol 2.5 mg daily. - BP historically too soft for ARNi/ARB. SBP 152 today, likely due to agitation (previously in 110s). Has had occasional dizziness with standing. Consider retrial of losartan in future if BP remains elevated. - Restart spironolactone 12.5 mg nightly. Spironolactone previously discontinued due to elevated potassium level of 5.5 on 06/11/20. Notably, that result was hemolyzed. Will restart spironolactone today with plans to monitor potassium carefully. Will repeat BMET next week.  -Continue Imdur 15 mg daily  - Continue Digoxin0.0625 mg daily. Digoxin level WNL on 06/15/2020 (0.7 ng/mL) -Not a candidate forCTR-P per EP - Daily wts + low sodium diet discussed  2. CAD - LHC in 2017 showed moderate nonobstructive disease - [facility] 6/21 w/ progressive disease: severe 2VD w/80-90% proximal LAD stenosis involving ostium of D1 (70% D1 stenosis), and  sequential 50% ostial and proximal LCx lesions followed by 80% mid/distal LCx stenosis (similar in appearance to 2017). Moderate, diffuse RCA disease similar to 2017. Medical management elected given lack of CP.  - remains stable w/o CP.  -Continue81 mg asa daily   -Continue statin, Crestor 5 mg daily  - Continue Bisoprolol2.5 mg daily   3. Mitral Regurgitation - Echo 02/2020 showed only mild MR - Echo 6/21>> moderate, likely functional   4. H/o Aortic Stenosis w/ TAVR - s/p TAVR 07/2016 - stable prosthesis on recent echo 6/21  5. Carotid Artery Disease - s/p Rt CEA 2017  - last carotid dopplers 2018 showed patent RICA and mod (50-69%) Lt ICA stenosis - asymptomatic - Continue medical therapy w/ ASA + statin. - Outpatient surveillance per gen cards   6. Chronic Afib - Well rate controlled, 59 bpm on EKG 06/11/20 -ContinueBisoprolol 2.5 mg daily.  - Continue Coumadin. INRs followed by PCP.  - Also on ASA for CAD. Denies abnormal bleeding.   7. T2DM -Hgb A1c 07/11/20 was 7.8 (improved from 9.8 on 01/24/20). - Managed by PCP. - On insulin - Rehab center stopped metformin and decreased insulin regimen recently due to low reading. - Patient would be a good candidate for SGLT2i in the future for concomitant T2DM and heart failure indications. Recommend ensuring he has no further low BG readings before starting.    8. Stage III CKD - baseline SCr ~1.3-1.5  9. Debility and Dementia - At home w/ 24 hr  home health nursing care + PT    Plan: 1) Medication changes: Based on clinical presentation, vital signs and recent labs will restart spironolactone 12.5 mg nightly. 2) Labs: BMET in 6 days with Dr. Kathlen Mody. 3) Follow-up: Follow-up with Bensimhon in 2 weeks, 07/14/20.   Audry Riles, PharmD, BCPS, BCCP, CPP Heart Failure Clinic Pharmacist [phone]

## 2020-07-02 NOTE — Patient Instructions (Signed)
It was a pleasure seeing you today!  MEDICATIONS: -We are changing your medications today - Restart Spironolactone at 12.5 mg (1/2 tablet) nightly -Call if you have questions about your medications.   NEXT APPOINTMENT: Return to clinic in 2 weeks with Dr. Haroldine Laws.  In general, to take care of your heart failure: -Limit your fluid intake to 2 Liters (half-gallon) per day.   -Limit your salt intake to ideally 2-3 grams (2000-3000 mg) per day. -Weigh yourself daily and record, and bring that "weight diary" to your next appointment.  (Weight gain of 2-3 pounds in 1 day typically means fluid weight.) -The medications for your heart are to help your heart and help you live longer.   -Please contact us before stopping any of your heart medications.  Call the clinic at [phone] with questions or to reschedule future appointments.

## 2020-07-03 ENCOUNTER — Other Ambulatory Visit: Payer: Self-pay | Admitting: Family Medicine

## 2020-07-03 MED ORDER — ALPRAZOLAM 0.25 MG PO TABS: 0.2500 mg | ORAL_TABLET | Freq: Three times a day (TID) | ORAL | 0 refills | Status: DC | PRN

## 2020-07-07 ENCOUNTER — Other Ambulatory Visit: Payer: Self-pay

## 2020-07-07 ENCOUNTER — Ambulatory Visit: Payer: PPO | Admitting: Physician Assistant

## 2020-07-07 ENCOUNTER — Encounter: Payer: Self-pay | Admitting: Physician Assistant

## 2020-07-07 VITALS — BP 104/62 | HR 82 | Ht 73.0 in | Wt 224.0 lb

## 2020-07-07 DIAGNOSIS — I5022 Chronic systolic (congestive) heart failure: Secondary | ICD-10-CM

## 2020-07-07 DIAGNOSIS — I2510 Atherosclerotic heart disease of native coronary artery without angina pectoris: Secondary | ICD-10-CM | POA: Diagnosis not present

## 2020-07-07 DIAGNOSIS — I6523 Occlusion and stenosis of bilateral carotid arteries: Secondary | ICD-10-CM | POA: Diagnosis not present

## 2020-07-07 DIAGNOSIS — I4821 Permanent atrial fibrillation: Secondary | ICD-10-CM | POA: Diagnosis not present

## 2020-07-07 DIAGNOSIS — I340 Nonrheumatic mitral (valve) insufficiency: Secondary | ICD-10-CM

## 2020-07-07 DIAGNOSIS — Z952 Presence of prosthetic heart valve: Secondary | ICD-10-CM | POA: Diagnosis not present

## 2020-07-07 NOTE — Patient Instructions (Addendum)
Medication Instructions:  Your physician recommends that you continue on your current medications as directed. Please refer to the Current Medication list given to you today.  *If you need a refill on your cardiac medications before your next appointment, please call your pharmacy*  Lab Work: You will have labs drawn today: BMET  Testing/Procedures: Your physician has requested that you have a carotid duplex. This test is an ultrasound of the carotid arteries in your neck. It looks at blood flow through these arteries that supply the brain with blood. Allow one hour for this exam. There are no restrictions or special instructions.  Follow-Up: At [facility], you and your health needs are our priority.  As part of our continuing mission to provide you with exceptional heart care, we have created designated Provider Care Teams.  These Care Teams include your primary Cardiologist (physician) and Advanced Practice Providers (APPs -  Physician Assistants and Nurse Practitioners) who all work together to provide you with the care you need, when you need it.  Your next appointment:   6 month(s)  The format for your next appointment:   In Person  Provider:   Sherren Mocha, MD

## 2020-07-07 NOTE — Progress Notes (Signed)
Cardiology Office Note:    Date:  07/07/2020   ID:  [name] [name], DOB 1931/12/26, MRN [id number]  PCP:  Kathyrn Drown, MD  Cardiologist:  Sherren Mocha, MD   Electrophysiologist:  None  Advanced Heart Failure Clinic:  [name], MD    Referring MD: Kathyrn Drown, MD   Chief Complaint:  Follow-up (CAD, CHF, AFib)    Patient Profile:    [name] [name] is a 86 y.o. male with:   Aortic stenosis ? S/p TAVR 03/2016  Coronary artery disease ? Moderate nonobstructive disease by cardiac catheterization in 2017 ? Cath 6/21: pLAD 85; D1 70; dLCx 80 >> Med Rx   Carotid artery disease ? S/p R carotid stent 03/2016 ? [place] 12/18:  Patent R ICA stent; LICA 85-27  Permanent atrial fibrillation  Warfarin (managed by PCP)  Chronic systolic CHF ? Nonischemic cardiomyopathy ? Echo 7/17: EF 35 (severe low gradientASwith low EF) ? Echo 3/18: EF 40 ? Echocardiogram 6/21: EF 35-40, BAE, RVSP 38  Mitral regurgitation   Echocardiogram 6/21: mod MR (likely functional MR)   Hypertension  Diabetes mellitus  Chronic kidney disease   Hyperlipidemia  Prior CV studies: Echocardiogram 05/12/20 EF 35-40, global HK, severe LAE, mod RAE, mod MR, mod TR, normally functioning TAVR, RVSP 37.8 mmHg   Cardiac catheterization 05/11/20 LM irregs LAD ost 40, prox 85; D1 70 LCx ost 50, prox 50, dist 80 RCA mod diffuse disease  Mean RA 13, mean PA 40, mean PCWP 27; CO 3.4, CI 1.6   Echocardiogram 02/14/2020 EF 40, global HK, mild LVH, mildly reduced RV SF, severe LAE, moderate RAE, mild MR, TAVR with mean gradient 5 mmHg, no AI  Carotid US 11/01/2017 Patent R ICA stent; LICA 78-24  Echocardiogram 11/01/2017 Mild LVH, EF 35-40, diffuse HK, s/p TAVR with mean gradient 5 mmHg, no AI, mild MAC, mild MR, moderate-severe LAE, mild RAE, mild TR, PASP 44  Echocardiogram 06/19/2017 Mild LVH, EF 40-45, diffuse HK, s/p TAVR, mean gradient 6 mmHg, no PVL, mild MR, mod LAE, PASP 38  Cardiac  catheterization 06/17/2016 LAD mid 75; D2 75 LCx ostial 50, mid 80 RCA mid 40  History of Present Illness:    [name] [name] was seen by me in June 2021 in follow up for shortness of breath.  I set him up for a R and L cardiac catheterization.  This demonstrated severe 2 v CAD with 80-90% pLAD stenosis involving the ostium of the D1 and sequential 50% ostial and proximal LCx lesions followed by 80% mid-distal LCx stenosis.  There were moderately elevated L and R heart and pulmonary artery pressures.  There was severely reduced CO/CI.  He was admitted for management of low output heart failure.  PCI of the LAD could be considered once his CHF was optimized.  He was managed with Milrinone and IV Furosemide.  He was followed by the CHF Team (Dr. Haroldine Laws).  He was not felt to be a candidate for CRT by EP.  Upon further review with interventional cardiology, it was decided to not intervene on the LAD given lack of chest pain.  Medical Rx was continued.  He was DC to SNF.    He was seen in the CHF clinic 06/11/20.  Digoxin level was elevated and his dose was reduced.  He was also taken off of spironolactone due to hyperkalemia.  He was placed back on spironolactone 8/19 in the pharmacy clinic.  He needs a BMET today.  He has not  been having chest discomfort.  His breathing is stable.  He has not had orthopnea, lower extremity swelling or syncope.  He was diagnosed with pneumonia some weeks ago and still has a cough.   Past Medical History:  Diagnosis Date  . Acute on chronic systolic CHF (congestive heart failure) ([place]) 02/09/2017  . Acute respiratory failure with hypoxia ([place])   . AKI (acute kidney injury) ([place]) 08/20/2015  . Altered mental status 02/21/2017  . Aortic stenosis    a. mod-sev by echo 05/2016.  . Asthma   . Asthma exacerbation 08/20/2015  . Atrial fibrillation (HCC)    Paroxysmal; Echo in 2007-normal EF; mild LVH; LA enlargement; mild AS, minimal AI; neg. stress nuclear study in 2008   . BPH  (benign prostatic hypertrophy) with urinary obstruction 09/27/2011   Transurethral resection of the prostate in 09/2011   . CAD (coronary artery disease), native coronary artery 06/28/2016  . Cardiogenic shock ([place])   . Carotid stenosis 04/25/2012  . Cerebrovascular disease 07/2010   TIA; carotid ultrasound in 07/2010-significant bilateral plaque without focal internal carotid artery stenosis; MRI -encephalomalacia left temporal and right temporal lobes; small inferior right cerebellar infarct; small vessel disease  . Chronic atrial fibrillation (HCC)    Paroxysmal; Echocardiogram in 2007-normal EF; mild LVH; left atrial enlargement; mild stenosis and minimal AI; negative stress nuclear study in 2008  . Chronic systolic CHF (congestive heart failure) ([place])    a. dx 05/2016 - EF 35-40%, diffuse HK, mod-severe AS, mod gradient, severe AVA VTI likely due to decreased cardiac output in setting of systolic dysfsunction and significant mitral regurgitation, mild MR, mod-severe MR, severe LAE, mild-mod RV dilation, mild RAE, mild-mod TR, mod PASP 27mHg.  . CKD (chronic kidney disease), stage II   . Cognitive dysfunction 05/29/2015   I believe that this is related to his age as well as previous stroke   . Congestive heart failure (CHF) ([place] 02/09/2017  . Degenerative joint disease    feet and legs  . Diabetes mellitus    no insulin; A1c of 6.6 in 2005  . Dizziness    occurs daily,especially in am  . Elevated troponin   . Exertional dyspnea   . Gastroesophageal reflux disease   . Hepatic steatosis   . History of noncompliance with medical treatment   . Hyperlipidemia    adverse reactions to statins and niacin  . Hypertension    Borderline  . Irregular heartbeat   . Mitral regurgitation    a. mod-sev by echo 05/2016  . Peripheral vascular disease ([place]   . Renal insufficiency   . S/P TAVR (transcatheter aortic valve replacement) 07/19/2016   29 mm Edwards Sapien 3 transcatheter heart valve placed  via left percutaneous transfemoral approach  . Senile purpura ([place] 08/15/2018  . Temporal arteritis ([place]   . Tricuspid regurgitation    a. mild-mod TR by echo 05/2016  . VT (ventricular tachycardia) (HCC)     Current Medications: Current Meds  Medication Sig  . albuterol (PROVENTIL) (2.5 MG/3ML) 0.083% nebulizer solution Take 3 mLs (2.5 mg total) by nebulization every 6 (six) hours as needed for wheezing or shortness of breath.  . ALPRAZolam (XANAX) 0.25 MG tablet Take 1 tablet (0.25 mg total) by mouth 3 (three) times daily as needed for anxiety.  .Marland Kitchenaspirin EC 81 MG EC tablet Take 1 tablet (81 mg total) by mouth daily. Swallow whole.  . bisoprolol (ZEBETA) 5 MG tablet Take 0.5 tablets (2.5 mg total) by mouth daily.  .[name]  cholecalciferol (VITAMIN D3) 25 MCG (1000 UNIT) tablet Take 1,000 Units by mouth in the morning and at bedtime.  . digoxin (LANOXIN) 0.125 MG tablet Take 0.5 tablets (0.0625 mg total) by mouth daily.  . furosemide (LASIX) 20 MG tablet Take 1 tablet (20 mg total) by mouth daily.  . insulin glargine (LANTUS SOLOSTAR) 100 UNIT/ML Solostar Pen Inject 32 Units into the skin daily.  . isosorbide mononitrate (IMDUR) 30 MG 24 hr tablet Take 15 mg by mouth daily.  . nortriptyline (PAMELOR) 10 MG capsule Take 1 capsule (10 mg total) by mouth at bedtime. For burning in feet  . OLANZapine (ZYPREXA) 2.5 MG tablet TAKE 1 TABLET TWICE DAILY AS DIRECTED.  [name] psyllium (METAMUCIL) 58.6 % powder Take 1 packet by mouth daily.  . rosuvastatin (CRESTOR) 5 MG tablet TAKE (1) TABLET BY MOUTH ONCE DAILY AT BEDTIME.  [name] spironolactone (ALDACTONE) 25 MG tablet Take 0.5 tablets (12.5 mg total) by mouth daily.  [name] warfarin (COUMADIN) 3 MG tablet Take 1 tablet (3 mg total) by mouth daily at 4 PM. Every Sun,Tues, Thurs.  . warfarin (COUMADIN) 6 MG tablet Take 1 tablet (6 mg total) by mouth every Monday, Wednesday, Friday, and Saturday at 6 PM.     Allergies:   Ambien [zolpidem tartrate], Lipitor  [atorvastatin calcium], Ranitidine, Simvastatin, Xanax xr [alprazolam er], Cholestatin, and Clopidogrel bisulfate   Social History   Tobacco Use  . Smoking status: Former Smoker    Packs/day: 1.00    Years: 20.00    Pack years: 20.00    Types: Cigarettes    Start date: 07/04/1950    Quit date: 04/25/1992    Years since quitting: 28.2  . Smokeless tobacco: Current User    Types: Chew  Substance Use Topics  . Alcohol use: No    Alcohol/week: 0.0 standard drinks  . Drug use: No     Family Hx: The patient's family history includes Diabetes in his father; Stroke in his mother. There is no history of Colon cancer.  ROS   EKGs/Labs/Other Test Reviewed:    EKG:  EKG is not ordered today.  The ekg ordered today demonstrates n/a  Recent Labs: 04/01/2020: NT-Pro BNP 985 06/14/2020: ALT 62; Hemoglobin 15.2; Platelets 209 06/15/2020: B Natriuretic Peptide 88.9; BUN 20; Creatinine, Ser 1.49; Potassium 4.4; Sodium 133   Recent Lipid Panel Lab Results  Component Value Date/Time   CHOL 137 01/24/2020 09:00 AM   TRIG 147 01/24/2020 09:00 AM   HDL 37 (L) 01/24/2020 09:00 AM   CHOLHDL 4.3 07/23/2019 02:52 PM   CHOLHDL 5.9 08/01/2014 10:03 AM   LDLCALC 74 01/24/2020 09:00 AM    Physical Exam:    VS:  BP 104/62   Pulse 82   Ht _0  (1.854 m)   Wt 224 lb (101.6 kg)   SpO2 92%   BMI 29.55 kg/m     Wt Readings from Last 3 Encounters:  07/07/20 224 lb (101.6 kg)  06/30/20 223 lb 9.6 oz (101.4 kg)  06/14/20 (!) 214 lb (97.1 kg)     Constitutional:      Appearance: Healthy appearance. Not in distress.  Pulmonary:     Effort: Pulmonary effort is normal.     Breath sounds: No wheezing. No rales.  Cardiovascular:     Normal rate. Irregularly irregular rhythm. Normal S1. Normal S2.     Murmurs: There is a grade 2/6 holosystolic murmur at the LLSB.  Edema:    Peripheral edema absent.  Abdominal:  Palpations: Abdomen is soft.  Musculoskeletal:     Cervical back: Neck supple.  Skin:    General: Skin is warm and dry.  Neurological:     General: No focal deficit present.     Mental Status: Alert and oriented to person, place and time.     Cranial Nerves: Cranial nerves are intact.       ASSESSMENT & PLAN:    1. Chronic systolic heart failure (HCC) EF 35-40 by echocardiogram 04/2020.  Nonischemic cardiomyopathy.  NYHA IIb-III.  Volume status overall stable.  He is currently managed in the advanced heart failure clinic.  Blood pressure will not allow addition of ACE inhibitor/ARB.  Continue bisoprolol, digoxin, furosemide, isosorbide, spironolactone.  Obtain follow-up BMET today.  Continue follow-up with Dr. Haroldine Laws of the advanced heart failure clinic.  2. Coronary artery disease involving native coronary artery of native heart without angina pectoris Cardiac catheterization June 2021 with 85% proximal LAD stenosis.  PCI was considered at that time.  However, it was decided to manage him medically given no symptoms of angina.  It was felt that his shortness of breath was mainly related to heart failure.  He is currently not having chest discomfort.  Continue aspirin, beta-blocker, rosuvastatin, isosorbide.  Follow-up with [name] in 6 months  3. S/P TAVR (transcatheter aortic valve replacement) Normally functioning TAVR by most recent echocardiogram.  Continue SBE prophylaxis.  4. Mitral valve insufficiency, unspecified etiology Moderate by most recent echocardiogram.  5. Permanent atrial fibrillation ([place]) He remains on warfarin for anticoagulation.  This is managed by primary care.  Rate is well controlled.  Continue current dose of bisoprolol.  6. Bilateral carotid artery stenosis Status post right carotid stent in 2017.  Last ultrasound obtained in 2018 with 50-69% stenosis in the left ICA.  Obtain follow-up carotid US.  Continue aspirin, rosuvastatin.   Dispo:  Return in about 6 months (around 01/07/2021) for Routine Follow Up, w/ [name], in person.     Medication Adjustments/Labs and Tests Ordered: Current medicines are reviewed at length with the patient today.  Concerns regarding medicines are outlined above.  Tests Ordered: Orders Placed This Encounter  Procedures  . Basic metabolic panel  . VAS US CAROTID   Medication Changes: No orders of the defined types were placed in this encounter.   Signed, Richardson Dopp, PA-C  07/07/2020 3:38 PM    [facility] [address], [place], [place]  [address] Phone: [phone]; Fax: [phone]

## 2020-07-08 ENCOUNTER — Other Ambulatory Visit: Payer: Self-pay | Admitting: Physician Assistant

## 2020-07-08 DIAGNOSIS — I6523 Occlusion and stenosis of bilateral carotid arteries: Secondary | ICD-10-CM

## 2020-07-08 LAB — BASIC METABOLIC PANEL
BUN/Creatinine Ratio: 14 (ref 10–24)
BUN: 17 mg/dL (ref 8–27)
CO2: 24 mmol/L (ref 20–29)
Calcium: 8.9 mg/dL (ref 8.6–10.2)
Chloride: 100 mmol/L (ref 96–106)
Creatinine, Ser: 1.19 mg/dL (ref 0.76–1.27)
GFR calc Af Amer: 64 mL/min/{1.73_m2} (ref >59)
GFR calc non Af Amer: 55 mL/min/{1.73_m2} — ABNORMAL LOW (ref >59)
Glucose: 222 mg/dL — ABNORMAL HIGH (ref 65–99)
Potassium: 4.3 mmol/L (ref 3.5–5.2)
Sodium: 136 mmol/L (ref 134–144)

## 2020-07-09 ENCOUNTER — Other Ambulatory Visit: Payer: Self-pay | Admitting: Family Medicine

## 2020-07-10 ENCOUNTER — Telehealth: Payer: Self-pay | Admitting: Family Medicine

## 2020-07-10 NOTE — Telephone Encounter (Signed)
Patient may have Xanax sent into his pharmacy with current +1 refill, may pend  As for the diabetes medicine I would recommend sticking with insulin at this point.  The other medications run the risk of aggravating peripheral artery disease and potentially increasing the risk of amputation If possible family should record some glucose readings and send those to [place] within the next 2 weeks

## 2020-07-10 NOTE — Telephone Encounter (Signed)
Discussed with Barbara(niece/caregiver) (DPR).  Niece verbalized understanding.

## 2020-07-10 NOTE — Telephone Encounter (Signed)
Daughter notified 

## 2020-07-10 NOTE — Telephone Encounter (Signed)
Patient's caregivers mother has Covid but caregiver has been fully vaccined. Patient daughter wants to know if she needs to be around patient or not. Please advise

## 2020-07-10 NOTE — Telephone Encounter (Signed)
If the caretaker has been around her mother then the caretaker should stay away from [facility] for at least the next 10 days

## 2020-07-13 NOTE — Progress Notes (Signed)
Advanced Heart Failure Clinic Note   Referring Physician: PCP: Kathyrn Drown, MD PCP-Cardiologist: Sherren Mocha, MD  AHF: Dr. Haroldine Laws   HPI:   86 y/o male, widower (wife died 2 years ago), with DM2, HTN, CKD, CHF with prior EF 40%, CAD, AS s/p TAVR in 2017 and dementia, who was referred for diagnostic [facility] for worsening dyspnea.   R/LHC on 05/11/20 demonstrated elevated filling pressures c/w volume overload, low output HF and significant 2v CAD. CI 1.6 by Fick calculation. 1.3 by Thermodilution. PCWP 27 mmHg. Angiography showed  80-90% proximal LAD stenosis involving ostium of D1 (70% D1 stenosis), and sequential 50% ostial and proximal LCx lesions followed by 80% mid/distal LCx stenosis (similar in appearance to 2017). Moderate, diffuse RCA disease similar to 2017.  He was directly admitted from the cath lab and started on milrinone,  IV Lasix and IV heparin. Echo was repeated and demonstrated LVEF 25-40% (lower than prior study), RV ok, moderate MR, prosthetic AV ok. He diuresed well and hemodynamics improved on milrinone. Transitioned off milrinone and placed back on PO diuretics. EP evaluated for potential CRT pacing but he was deemed not a candidate given QRS duration, advanced age and AFib. Case also discussed with interventionalist and it was decided not to intervene on the LAD given lack of CP. Will continue to optimize medically. He was treated w/ ASA, statin, ? blocker as well as spiro and digoxin for HF. BP too soft for ARNI/ARB. Coumadin resumed for AF.   PT/OT evaluated and recommended SNF. He was discharged to SNF on 7/1.   Today he returns for HF follow up with his niece. Overall feeling ok. Limited by back pain. He has ongoing fatigue. Remains SOB with exertion. Denies PND/Orthopnea. Needs help with ADLs. Appetite ok. No fever or chills. Weight at home 21-219 pounds. Taking all medications. He has 24 hour caregivers.    R/LHC 05/11/20 Conclusions: 1. Severe two-vessel  coronary artery disease, including 80-90% proximal LAD stenosis involving ostium of D1 (70% D1 stenosis), and sequential 50% ostial and proximal LCx lesions followed by 80% mid/distal LCx stenosis (similar in appearance to 2017). 2. Moderate, diffuse RCA disease similar to 2017. 3. Moderately elevated left heart, right heart, and pulmonary artery pressures. 4. Prominent V-waves on PCWP tracing; query more severe mitral regurgitation than reported on echocardiogram in 02/2020. 5. Severely reduced cardiac output/index.  Right Heart Pressures RA (mean): 13 mmHg RV (S/EDP): 60/12 mmHg PA (S/D, mean): 60/30 (40) mmHg PCWP (mean): 27 mmHg with prominent V-waves  Ao sat: 98% PA sat: 55%  Fick CO: 3.4 L/min Fick CI: 1.6 L/min/m^2  Thermodilution CO: 2.8 L/min Thermodilution CI: 1.3 L/min/m^2    2D Echo 04/2020 Left ventricular ejection fraction, by estimation, is 35 to 40%. The left ventricle has moderately decreased function. The left ventricle demonstrates global hypokinesis. Left ventricular diastolic function could not be evaluated. 2. Right ventricular systolic function is normal. The right ventricular size is normal. There is mildly elevated pulmonary artery systolic pressure. 3. Left atrial size was severely dilated. 4. Right atrial size was moderately dilated. 5. The mitral valve is normal in structure. Moderate mitral valve regurgitation. 6. Tricuspid valve regurgitation is moderate. 7. The aortic valve has been repaired/replaced. Aortic valve regurgitation is not visualized. There is a 29 mm Edwards Sapien prosthetic (TAVR) valve present in the aortic position. Echo findings are consistent with normal structure and function of the aortic valve prosthesis. 8. The inferior vena cava is normal in size with greater than  50% respiratory variability, suggesting right atrial pressure of 3 mmHg.      Past Medical History:  Diagnosis Date  . Acute on chronic systolic CHF  (congestive heart failure) ([place]) 02/09/2017  . Acute respiratory failure with hypoxia ([place])   . AKI (acute kidney injury) ([place]) 08/20/2015  . Altered mental status 02/21/2017  . Aortic stenosis    a. mod-sev by echo 05/2016.  . Asthma   . Asthma exacerbation 08/20/2015  . Atrial fibrillation (HCC)    Paroxysmal; Echo in 2007-normal EF; mild LVH; LA enlargement; mild AS, minimal AI; neg. stress nuclear study in 2008   . BPH (benign prostatic hypertrophy) with urinary obstruction 09/27/2011   Transurethral resection of the prostate in 09/2011   . CAD (coronary artery disease), native coronary artery 06/28/2016  . Cardiogenic shock ([place])   . Carotid stenosis 04/25/2012  . Cerebrovascular disease 07/2010   TIA; carotid ultrasound in 07/2010-significant bilateral plaque without focal internal carotid artery stenosis; MRI -encephalomalacia left temporal and right temporal lobes; small inferior right cerebellar infarct; small vessel disease  . Chronic atrial fibrillation (HCC)    Paroxysmal; Echocardiogram in 2007-normal EF; mild LVH; left atrial enlargement; mild stenosis and minimal AI; negative stress nuclear study in 2008  . Chronic systolic CHF (congestive heart failure) ([place])    a. dx 05/2016 - EF 35-40%, diffuse HK, mod-severe AS, mod gradient, severe AVA VTI likely due to decreased cardiac output in setting of systolic dysfsunction and significant mitral regurgitation, mild MR, mod-severe MR, severe LAE, mild-mod RV dilation, mild RAE, mild-mod TR, mod PASP 37mmHg.  . CKD (chronic kidney disease), stage II   . Cognitive dysfunction 05/29/2015   I believe that this is related to his age as well as previous stroke   . Congestive heart failure (CHF) ([place]) 02/09/2017  . Degenerative joint disease    feet and legs  . Diabetes mellitus    no insulin; A1c of 6.6 in 2005  . Dizziness    occurs daily,especially in am  . Elevated troponin   . Exertional dyspnea   . Gastroesophageal reflux disease   .  Hepatic steatosis   . History of noncompliance with medical treatment   . Hyperlipidemia    adverse reactions to statins and niacin  . Hypertension    Borderline  . Irregular heartbeat   . Mitral regurgitation    a. mod-sev by echo 05/2016  . Peripheral vascular disease ([place])   . Renal insufficiency   . S/P TAVR (transcatheter aortic valve replacement) 07/19/2016   29 mm Edwards Sapien 3 transcatheter heart valve placed via left percutaneous transfemoral approach  . Senile purpura ([place]) 08/15/2018  . Temporal arteritis ([place])   . Tricuspid regurgitation    a. mild-mod TR by echo 05/2016  . VT (ventricular tachycardia) (HCC)     Current Outpatient Medications  Medication Sig Dispense Refill  . albuterol (PROVENTIL) (2.5 MG/3ML) 0.083% nebulizer solution Take 3 mLs (2.5 mg total) by nebulization every 6 (six) hours as needed for wheezing or shortness of breath. 150 mL 0  . ALPRAZolam (XANAX) 0.25 MG tablet Take 1 tablet (0.25 mg total) by mouth 3 (three) times daily as needed for anxiety. 30 tablet 0  . aspirin EC 81 MG EC tablet Take 1 tablet (81 mg total) by mouth daily. Swallow whole. 30 tablet 11  . bisoprolol (ZEBETA) 5 MG tablet Take 0.5 tablets (2.5 mg total) by mouth daily. 15 tablet 0  . cholecalciferol (VITAMIN D3) 25 MCG (1000 UNIT)  tablet Take 1,000 Units by mouth in the morning and at bedtime.    . digoxin (LANOXIN) 0.125 MG tablet Take 0.5 tablets (0.0625 mg total) by mouth daily. 45 tablet 3  . furosemide (LASIX) 20 MG tablet Take 1 tablet (20 mg total) by mouth daily. 30 tablet 0  . insulin glargine (LANTUS SOLOSTAR) 100 UNIT/ML Solostar Pen Inject 32 Units into the skin daily.    . isosorbide mononitrate (IMDUR) 30 MG 24 hr tablet Take 15 mg by mouth daily.    . nortriptyline (PAMELOR) 10 MG capsule Take 1 capsule (10 mg total) by mouth at bedtime. For burning in feet 30 capsule 0  . OLANZapine (ZYPREXA) 2.5 MG tablet TAKE 1 TABLET TWICE DAILY AS DIRECTED. 60 tablet 5  .  psyllium (METAMUCIL) 58.6 % powder Take 1 packet by mouth daily.    . rosuvastatin (CRESTOR) 5 MG tablet TAKE (1) TABLET BY MOUTH ONCE DAILY AT BEDTIME. 30 tablet 0  . spironolactone (ALDACTONE) 25 MG tablet Take 0.5 tablets (12.5 mg total) by mouth daily. 15 tablet 11  . warfarin (COUMADIN) 3 MG tablet Take 1 tablet (3 mg total) by mouth daily at 4 PM. Every Sun,Tues, Thurs. 12 tablet 0  . warfarin (COUMADIN) 6 MG tablet Take 1 tablet (6 mg total) by mouth every Monday, Wednesday, Friday, and Saturday at 6 PM. 16 tablet 0   No current facility-administered medications for this encounter.    Allergies  Allergen Reactions  . Ambien [Zolpidem Tartrate] Other (See Comments)    Sleep walks  . Lipitor [Atorvastatin Calcium] Other (See Comments)    myalgias  . Ranitidine Other (See Comments)    Chest discomfort  . Simvastatin Other (See Comments)    Myalgias  . Xanax Xr [Alprazolam Er] Other (See Comments)    Tightness in chest  . Cholestatin Other (See Comments)    UNSPECIFIED REACTION   . Clopidogrel Bisulfate Rash      Social History   Socioeconomic History  . Marital status: Widowed    Spouse name: Not on file  . Number of children: 1  . Years of education: Not on file  . Highest education level: Not on file  Occupational History  . Occupation: Retired  Tobacco Use  . Smoking status: Former Smoker    Packs/day: 1.00    Years: 20.00    Pack years: 20.00    Types: Cigarettes    Start date: 07/04/1950    Quit date: 04/25/1992    Years since quitting: 28.2  . Smokeless tobacco: Current User    Types: Chew  Substance and Sexual Activity  . Alcohol use: No    Alcohol/week: 0.0 standard drinks  . Drug use: No  . Sexual activity: Not Currently  Other Topics Concern  . Not on file  Social History Narrative  . Not on file   Social Determinants of Health   Financial Resource Strain:   . Difficulty of Paying Living Expenses: Not on file  Food Insecurity:   . Worried  About Charity fundraiser in the Last Year: Not on file  . Ran Out of Food in the Last Year: Not on file  Transportation Needs:   . Lack of Transportation (Medical): Not on file  . Lack of Transportation (Non-Medical): Not on file  Physical Activity:   . Days of Exercise per Week: Not on file  . Minutes of Exercise per Session: Not on file  Stress:   . Feeling of Stress : Not  on file  Social Connections:   . Frequency of Communication with Friends and Family: Not on file  . Frequency of Social Gatherings with Friends and Family: Not on file  . Attends Religious Services: Not on file  . Active Member of Clubs or Organizations: Not on file  . Attends Archivist Meetings: Not on file  . Marital Status: Not on file  Intimate Partner Violence:   . Fear of Current or Ex-Partner: Not on file  . Emotionally Abused: Not on file  . Physically Abused: Not on file  . Sexually Abused: Not on file      Family History  Problem Relation Age of Onset  . Stroke Mother   . Diabetes Father   . Colon cancer Neg Hx     Vitals:   07/14/20 1540  BP: 108/60  Pulse: 69  SpO2: 93%  Weight: 100.4 kg (221 lb 6 oz)   Wt Readings from Last 3 Encounters:  07/14/20 100.4 kg (221 lb 6 oz)  07/07/20 101.6 kg (224 lb)  06/30/20 101.4 kg (223 lb 9.6 oz)     PHYSICAL EXAM: General:  Elderly. Arrived in wheel chair. No resp difficulty HEENT: normal Neck: supple. no JVD. Carotids 2+ bilat; no bruits. No lymphadenopathy or thryomegaly appreciated. Cor: PMI nondisplaced. Irregular rate & rhythm. No rubs, gallops or murmurs. Lungs: clear Abdomen: soft, nontender, nondistended. No hepatosplenomegaly. No bruits or masses. Good bowel sounds. Extremities: no cyanosis, clubbing, rash, edema Neuro: alert & orientedx3, cranial nerves grossly intact. moves all 4 extremities w/o difficulty. Affect pleasant    ASSESSMENT & PLAN:  1. Chronic Systolic Heart Failure  - Echo 4/21 LVEF 40%. RV ok. Mild  MR - Echo 5/21 LVEF 35-40%. RV ok, moderate MR - Echo 6/21 LVEF 35-40%, RV ok, moderate MR, prosthetic AV ok  - Recent admission 6/21 for ADHF. RHC demonstrated elevated filling pressures and low CO/CI. PCWP 27 mmHg. RA pressure 13 mmHg. CI 1.6 by FICK, 1.3 by Thermo. Required milrinone to help w/ diuresis.  -NYHA  III. Volume status stable.  Continue Lasix 20 mg daily  - Continue bisoprolol 2.5 mg daily.  - Continue 0.125 mg digoxin, dig level 0.7 (06/15/20) - Continue spironolactone 12.5 mg qhs - Continue Imdur 15 mg daily  - BP too soft for ARNi/ARB.   - not a candidate for CTR-P per EP   2. CAD - LHC in 2017 showed moderate nonobstructive disease - [facility] 6/21 w/ progressive disease: severe 2VD w/80-90% proximal LAD stenosis involving ostium of D1 (70% D1 stenosis), and sequential 50% ostial and proximal LCx lesions followed by 80% mid/distal LCx stenosis (similar in appearance to 2017). Moderate, diffuse RCA disease similar to 2017. Medical management elected given lack of CP.  - No chest pain.  - Continue 81 mg asa daily   - Continue statin, Crestor 5 mg daily  - continue bisoprolol 2.5 mg daily   3. Mitral Regurgitation - Echo 02/2020 showed only mild MR - Echo 6/21>> moderate, likely functional    4. H/o Aortic Stenosis w/ TAVR - s/p TAVR 07/2016 - stable prosthesis on recent echo 6/21  5. Carotid Artery Disease - s/p Rt CEA 2017  - last carotid dopplers 2018 showed patent RICA and mod (50-69%) Lt ICA stenosis - asymptomatic - continue medical therapy w/ ASA + statin. BP is well controlled.  - outpatient surveillance per gen cards   6. Chronic Afib - Continue bisoprolol 2.5 mg daily.   - Continue Coumadin. INRs  followed by PCP.  - also on ASA for CAD. Denies abnormal bleeding. Check CBC today   7. T2DM - poorly controlled. Hgb A1c 3/21 was 9.8  - on insulin - managed by PCP  8. Stage III CKD - baseline SCr ~1.3-1.5 - Reviewed BMET from 07/07/20,  stable.  9. Debility and Dementia - At home w/ 24 hr home health nursing care + PT    Follow up in 3 months.   Darrick Grinder, NP 07/14/20   Patient seen and examined with the above-signed Advanced Practice Provider and/or Housestaff. I personally reviewed laboratory data, imaging studies and relevant notes. I independently examined the patient and formulated the important aspects of the plan. I have edited the note to reflect any of my changes or salient points. I have personally discussed the plan with the patient and/or family.  Remains very weak. Gets around with walker. Denies CP, LE edema, orthopnea or PND. Complaint with meds   General:  Elderly weak appearing. No resp difficulty HEENT: normal Neck: supple. no JVD. Carotids 2+ bilat; no bruits. No lymphadenopathy or thryomegaly appreciated. Cor: PMI nondisplaced. Irregular rate & rhythm. No rubs, gallops or murmurs. Lungs: clear Abdomen: soft, nontender, nondistended. No hepatosplenomegaly. No bruits or masses. Good bowel sounds. Extremities: no cyanosis, clubbing, rash, no edema Neuro: alert & orientedx3, cranial nerves grossly intact. moves all 4 extremities w/o difficulty. Affect pleasant  Stable from HF standpoint. Functional capacity mainly limited by age and physical debility. No changes to meds today. I offered to arrange for outpatient PT but family is not interested in this currently. (He just complete HHPT).   [name], MD  6:13 PM

## 2020-07-14 ENCOUNTER — Ambulatory Visit (HOSPITAL_COMMUNITY)
Admission: RE | Admit: 2020-07-14 | Discharge: 2020-07-14 | Disposition: A | Discharge status: Home / Self Care | Payer: PPO | Source: Ambulatory Visit | Attending: Internal Medicine | Admitting: Internal Medicine

## 2020-07-14 ENCOUNTER — Other Ambulatory Visit: Payer: Self-pay

## 2020-07-14 VITALS — BP 108/60 | HR 69 | Wt 221.4 lb

## 2020-07-14 DIAGNOSIS — Z888 Allergy status to other drugs, medicaments and biological substances status: Secondary | ICD-10-CM | POA: Insufficient documentation

## 2020-07-14 DIAGNOSIS — Z87891 Personal history of nicotine dependence: Secondary | ICD-10-CM | POA: Diagnosis not present

## 2020-07-14 DIAGNOSIS — I5022 Chronic systolic (congestive) heart failure: Secondary | ICD-10-CM | POA: Insufficient documentation

## 2020-07-14 DIAGNOSIS — I482 Chronic atrial fibrillation, unspecified: Secondary | ICD-10-CM | POA: Diagnosis not present

## 2020-07-14 DIAGNOSIS — I130 Hypertensive heart and chronic kidney disease with heart failure and stage 1 through stage 4 chronic kidney disease, or unspecified chronic kidney disease: Secondary | ICD-10-CM | POA: Diagnosis not present

## 2020-07-14 DIAGNOSIS — I2510 Atherosclerotic heart disease of native coronary artery without angina pectoris: Secondary | ICD-10-CM

## 2020-07-14 DIAGNOSIS — Z794 Long term (current) use of insulin: Secondary | ICD-10-CM | POA: Diagnosis not present

## 2020-07-14 DIAGNOSIS — Z79899 Other long term (current) drug therapy: Secondary | ICD-10-CM | POA: Diagnosis not present

## 2020-07-14 DIAGNOSIS — E785 Hyperlipidemia, unspecified: Secondary | ICD-10-CM | POA: Diagnosis not present

## 2020-07-14 DIAGNOSIS — F0390 Unspecified dementia without behavioral disturbance: Secondary | ICD-10-CM | POA: Diagnosis not present

## 2020-07-14 DIAGNOSIS — Z7901 Long term (current) use of anticoagulants: Secondary | ICD-10-CM | POA: Diagnosis not present

## 2020-07-14 DIAGNOSIS — K760 Fatty (change of) liver, not elsewhere classified: Secondary | ICD-10-CM | POA: Diagnosis not present

## 2020-07-14 DIAGNOSIS — Z7982 Long term (current) use of aspirin: Secondary | ICD-10-CM | POA: Diagnosis not present

## 2020-07-14 DIAGNOSIS — K219 Gastro-esophageal reflux disease without esophagitis: Secondary | ICD-10-CM | POA: Insufficient documentation

## 2020-07-14 DIAGNOSIS — J45909 Unspecified asthma, uncomplicated: Secondary | ICD-10-CM | POA: Diagnosis not present

## 2020-07-14 DIAGNOSIS — E1122 Type 2 diabetes mellitus with diabetic chronic kidney disease: Secondary | ICD-10-CM | POA: Insufficient documentation

## 2020-07-14 DIAGNOSIS — Z952 Presence of prosthetic heart valve: Secondary | ICD-10-CM | POA: Diagnosis not present

## 2020-07-14 DIAGNOSIS — N183 Chronic kidney disease, stage 3 unspecified: Secondary | ICD-10-CM | POA: Diagnosis not present

## 2020-07-14 DIAGNOSIS — Z8673 Personal history of transient ischemic attack (TIA), and cerebral infarction without residual deficits: Secondary | ICD-10-CM | POA: Diagnosis not present

## 2020-07-14 DIAGNOSIS — Z833 Family history of diabetes mellitus: Secondary | ICD-10-CM | POA: Insufficient documentation

## 2020-07-14 NOTE — Patient Instructions (Signed)
Please call our office in February to schedule your follow up appointment.  If you have any questions or concerns before your next appointment please send [place] a message through [place] or call our office at [phone].    TO LEAVE A MESSAGE FOR THE NURSE SELECT OPTION 2, PLEASE LEAVE A MESSAGE INCLUDING: . YOUR NAME . DATE OF BIRTH . CALL BACK NUMBER . REASON FOR CALL**this is important as we prioritize the call backs  [place] AS LONG AS YOU CALL BEFORE 4:00 PM  At the [facility], you and your health needs are our priority. As part of our continuing mission to provide you with exceptional heart care, we have created designated Provider Care Teams. These Care Teams include your primary Cardiologist (physician) and Advanced Practice Providers (APPs- Physician Assistants and Nurse Practitioners) who all work together to provide you with the care you need, when you need it.   You may see any of the following providers on your designated Care Team at your next follow up: [name] Dr [name] . Dr Loralie Champagne . Darrick Grinder, NP . Lyda Jester, PA . Audry Riles, PharmD   Please be sure to bring in all your medications bottles to every appointment.

## 2020-07-15 ENCOUNTER — Ambulatory Visit (INDEPENDENT_AMBULATORY_CARE_PROVIDER_SITE_OTHER): Payer: PPO

## 2020-07-15 DIAGNOSIS — I6523 Occlusion and stenosis of bilateral carotid arteries: Secondary | ICD-10-CM | POA: Diagnosis not present

## 2020-07-16 ENCOUNTER — Encounter: Payer: Self-pay | Admitting: Physician Assistant

## 2020-07-17 ENCOUNTER — Telehealth: Payer: Self-pay | Admitting: Physician Assistant

## 2020-07-17 NOTE — Telephone Encounter (Signed)
New Message   Patient is returning call in reference to doppler results.

## 2020-07-17 NOTE — Telephone Encounter (Signed)
The patient has been notified of the result and verbalized understanding.  All questions (if any) were answered. Darrell Jewel, RN 07/17/2020 11:54 AM

## 2020-07-27 DIAGNOSIS — E119 Type 2 diabetes mellitus without complications: Secondary | ICD-10-CM | POA: Diagnosis not present

## 2020-07-28 ENCOUNTER — Other Ambulatory Visit (INDEPENDENT_AMBULATORY_CARE_PROVIDER_SITE_OTHER): Payer: PPO | Admitting: *Deleted

## 2020-07-28 ENCOUNTER — Other Ambulatory Visit: Payer: Self-pay

## 2020-07-28 DIAGNOSIS — Z7901 Long term (current) use of anticoagulants: Secondary | ICD-10-CM | POA: Diagnosis not present

## 2020-07-28 DIAGNOSIS — W19XXXA Unspecified fall, initial encounter: Secondary | ICD-10-CM

## 2020-07-28 LAB — POCT INR: INR: 2.7 (ref 2.0–3.0)

## 2020-07-30 ENCOUNTER — Other Ambulatory Visit: Payer: Self-pay | Admitting: Adult Health

## 2020-08-04 ENCOUNTER — Telehealth: Payer: Self-pay | Admitting: Family Medicine

## 2020-08-04 NOTE — Telephone Encounter (Signed)
Patient niece [name] wanting to know if you have set up his physical therapy section yet because they havent heard anything.

## 2020-08-04 NOTE — Telephone Encounter (Signed)
I know this was just put in last week but wanted to see if you had any info?

## 2020-08-06 NOTE — Telephone Encounter (Signed)
Sorry, just getting to this referral  Home health PT or PT at outpt rehab?  Please advise

## 2020-08-07 NOTE — Telephone Encounter (Signed)
Home PT for weakness in legs and ataxia with imbalance Pt homebound

## 2020-08-24 DIAGNOSIS — E119 Type 2 diabetes mellitus without complications: Secondary | ICD-10-CM | POA: Diagnosis not present

## 2020-08-25 ENCOUNTER — Other Ambulatory Visit: Payer: Self-pay | Admitting: Adult Health

## 2020-08-25 ENCOUNTER — Other Ambulatory Visit: Payer: Self-pay | Admitting: Family Medicine

## 2020-08-26 NOTE — Telephone Encounter (Signed)
So it is quite possible medications can be altered by folks outside of our office  So therefore before refilling these When we are uncertain I prefer for you all to connect with [name] [name] and make sure that these medications are definitely what he is taking if so then he may have 6 refills on each if not then do not refill them

## 2020-08-26 NOTE — Telephone Encounter (Signed)
[name] confirmed medications- Refill sent to pharmacy

## 2020-08-26 NOTE — Telephone Encounter (Signed)
Once again I do not trust the system Please review these medications with [name] make sure the amount and directions are correct. Obviously if she does not have these medicines with her she can always call us back to confirm Once confirmed then may send in 6 months on each

## 2020-08-26 NOTE — Telephone Encounter (Signed)
[name] [name] confirmed meds- refills sent to pharmacy.

## 2020-08-27 ENCOUNTER — Other Ambulatory Visit (INDEPENDENT_AMBULATORY_CARE_PROVIDER_SITE_OTHER): Payer: PPO | Admitting: *Deleted

## 2020-08-27 DIAGNOSIS — Z7901 Long term (current) use of anticoagulants: Secondary | ICD-10-CM

## 2020-08-27 LAB — POCT INR: INR: 2.5 (ref 2.0–3.0)

## 2020-08-31 ENCOUNTER — Other Ambulatory Visit: Payer: Self-pay | Admitting: Family Medicine

## 2020-08-31 ENCOUNTER — Encounter (HOSPITAL_COMMUNITY): Payer: Self-pay

## 2020-08-31 ENCOUNTER — Emergency Department (HOSPITAL_COMMUNITY)
Admission: EM | Admit: 2020-08-31 | Discharge: 2020-08-31 | Disposition: A | Discharge status: Home / Self Care | Payer: PPO | Attending: Emergency Medicine | Admitting: Emergency Medicine

## 2020-08-31 ENCOUNTER — Telehealth: Payer: Self-pay

## 2020-08-31 ENCOUNTER — Emergency Department (HOSPITAL_COMMUNITY): Payer: PPO

## 2020-08-31 ENCOUNTER — Other Ambulatory Visit: Payer: Self-pay

## 2020-08-31 DIAGNOSIS — M5489 Other dorsalgia: Secondary | ICD-10-CM | POA: Diagnosis not present

## 2020-08-31 DIAGNOSIS — W19XXXA Unspecified fall, initial encounter: Secondary | ICD-10-CM

## 2020-08-31 DIAGNOSIS — Z87891 Personal history of nicotine dependence: Secondary | ICD-10-CM | POA: Insufficient documentation

## 2020-08-31 DIAGNOSIS — R52 Pain, unspecified: Secondary | ICD-10-CM | POA: Diagnosis not present

## 2020-08-31 DIAGNOSIS — Z79899 Other long term (current) drug therapy: Secondary | ICD-10-CM | POA: Insufficient documentation

## 2020-08-31 DIAGNOSIS — S3992XA Unspecified injury of lower back, initial encounter: Secondary | ICD-10-CM | POA: Diagnosis present

## 2020-08-31 DIAGNOSIS — I4891 Unspecified atrial fibrillation: Secondary | ICD-10-CM | POA: Diagnosis not present

## 2020-08-31 DIAGNOSIS — W01190A Fall on same level from slipping, tripping and stumbling with subsequent striking against furniture, initial encounter: Secondary | ICD-10-CM | POA: Diagnosis not present

## 2020-08-31 DIAGNOSIS — I5023 Acute on chronic systolic (congestive) heart failure: Secondary | ICD-10-CM | POA: Diagnosis not present

## 2020-08-31 DIAGNOSIS — I130 Hypertensive heart and chronic kidney disease with heart failure and stage 1 through stage 4 chronic kidney disease, or unspecified chronic kidney disease: Secondary | ICD-10-CM | POA: Insufficient documentation

## 2020-08-31 DIAGNOSIS — G319 Degenerative disease of nervous system, unspecified: Secondary | ICD-10-CM | POA: Diagnosis not present

## 2020-08-31 DIAGNOSIS — S199XXA Unspecified injury of neck, initial encounter: Secondary | ICD-10-CM | POA: Diagnosis not present

## 2020-08-31 DIAGNOSIS — I739 Peripheral vascular disease, unspecified: Secondary | ICD-10-CM | POA: Diagnosis not present

## 2020-08-31 DIAGNOSIS — F0390 Unspecified dementia without behavioral disturbance: Secondary | ICD-10-CM | POA: Insufficient documentation

## 2020-08-31 DIAGNOSIS — K5792 Diverticulitis of intestine, part unspecified, without perforation or abscess without bleeding: Secondary | ICD-10-CM | POA: Insufficient documentation

## 2020-08-31 DIAGNOSIS — E1122 Type 2 diabetes mellitus with diabetic chronic kidney disease: Secondary | ICD-10-CM | POA: Insufficient documentation

## 2020-08-31 DIAGNOSIS — J45909 Unspecified asthma, uncomplicated: Secondary | ICD-10-CM | POA: Diagnosis not present

## 2020-08-31 DIAGNOSIS — N183 Chronic kidney disease, stage 3 unspecified: Secondary | ICD-10-CM | POA: Diagnosis not present

## 2020-08-31 DIAGNOSIS — Z7982 Long term (current) use of aspirin: Secondary | ICD-10-CM | POA: Insufficient documentation

## 2020-08-31 DIAGNOSIS — S0990XA Unspecified injury of head, initial encounter: Secondary | ICD-10-CM | POA: Diagnosis not present

## 2020-08-31 DIAGNOSIS — I959 Hypotension, unspecified: Secondary | ICD-10-CM | POA: Diagnosis not present

## 2020-08-31 DIAGNOSIS — S39012A Strain of muscle, fascia and tendon of lower back, initial encounter: Secondary | ICD-10-CM | POA: Diagnosis not present

## 2020-08-31 DIAGNOSIS — M47812 Spondylosis without myelopathy or radiculopathy, cervical region: Secondary | ICD-10-CM | POA: Diagnosis not present

## 2020-08-31 DIAGNOSIS — I2510 Atherosclerotic heart disease of native coronary artery without angina pectoris: Secondary | ICD-10-CM | POA: Insufficient documentation

## 2020-08-31 DIAGNOSIS — R1032 Left lower quadrant pain: Secondary | ICD-10-CM | POA: Diagnosis not present

## 2020-08-31 DIAGNOSIS — M549 Dorsalgia, unspecified: Secondary | ICD-10-CM | POA: Diagnosis not present

## 2020-08-31 DIAGNOSIS — M545 Low back pain, unspecified: Secondary | ICD-10-CM | POA: Diagnosis not present

## 2020-08-31 LAB — COMPREHENSIVE METABOLIC PANEL
ALT: 24 U/L (ref 0–44)
AST: 25 U/L (ref 15–41)
Albumin: 3.7 g/dL (ref 3.5–5.0)
Alkaline Phosphatase: 104 U/L (ref 38–126)
Anion gap: 10 (ref 5–15)
BUN: 13 mg/dL (ref 8–23)
CO2: 27 mmol/L (ref 22–32)
Calcium: 9.1 mg/dL (ref 8.9–10.3)
Chloride: 97 mmol/L — ABNORMAL LOW (ref 98–111)
Creatinine, Ser: 1.05 mg/dL (ref 0.61–1.24)
GFR, Estimated: >60 mL/min (ref >60)
Glucose, Bld: 232 mg/dL — ABNORMAL HIGH (ref 70–99)
Potassium: 3.6 mmol/L (ref 3.5–5.1)
Sodium: 134 mmol/L — ABNORMAL LOW (ref 135–145)
Total Bilirubin: 1.1 mg/dL (ref 0.3–1.2)
Total Protein: 8.2 g/dL — ABNORMAL HIGH (ref 6.5–8.1)

## 2020-08-31 LAB — CBC WITH DIFFERENTIAL/PLATELET
Abs Immature Granulocytes: 0.08 10*3/uL — ABNORMAL HIGH (ref 0.00–0.07)
Basophils Absolute: 0.1 10*3/uL (ref 0.0–0.1)
Basophils Relative: 1 %
Eosinophils Absolute: 0.5 10*3/uL (ref 0.0–0.5)
Eosinophils Relative: 5 %
HCT: 43.9 % (ref 39.0–52.0)
Hemoglobin: 14.6 g/dL (ref 13.0–17.0)
Immature Granulocytes: 1 %
Lymphocytes Relative: 6 %
Lymphs Abs: 0.7 10*3/uL (ref 0.7–4.0)
MCH: 30.2 pg (ref 26.0–34.0)
MCHC: 33.3 g/dL (ref 30.0–36.0)
MCV: 90.9 fL (ref 80.0–100.0)
Monocytes Absolute: 0.6 10*3/uL (ref 0.1–1.0)
Monocytes Relative: 6 %
Neutro Abs: 8.9 10*3/uL — ABNORMAL HIGH (ref 1.7–7.7)
Neutrophils Relative %: 81 %
Platelets: 209 10*3/uL (ref 150–400)
RBC: 4.83 MIL/uL (ref 4.22–5.81)
RDW: 13.5 % (ref 11.5–15.5)
WBC: 10.8 10*3/uL — ABNORMAL HIGH (ref 4.0–10.5)
nRBC: 0 % (ref 0.0–0.2)

## 2020-08-31 LAB — PROTIME-INR
INR: 2.1 — ABNORMAL HIGH (ref 0.8–1.2)
Prothrombin Time: 22.4 seconds — ABNORMAL HIGH (ref 11.4–15.2)

## 2020-08-31 LAB — LIPASE, BLOOD: Lipase: 20 U/L (ref 11–51)

## 2020-08-31 MED ORDER — AMOXICILLIN-POT CLAVULANATE 875-125 MG PO TABS: 1.0000 | ORAL_TABLET | Freq: Two times a day (BID) | ORAL | 0 refills | Status: DC

## 2020-08-31 MED ORDER — HYDROCODONE-ACETAMINOPHEN 5-325 MG PO TABS: 1.0000 | ORAL_TABLET | Freq: Four times a day (QID) | ORAL | 0 refills | Status: DC | PRN

## 2020-08-31 MED ORDER — IOHEXOL 300 MG/ML  SOLN
100.0000 mL | Freq: Once | INTRAMUSCULAR | Status: AC | PRN
  Administered 2020-08-31: 100 mL via INTRAVENOUS

## 2020-08-31 MED ORDER — AMOXICILLIN-POT CLAVULANATE 875-125 MG PO TABS
1.0000 | ORAL_TABLET | Freq: Once | ORAL | Status: AC
  Administered 2020-08-31: 1 via ORAL
  Filled 2020-08-31: qty 1

## 2020-08-31 MED ORDER — HYDROCODONE-ACETAMINOPHEN 5-325 MG PO TABS
1.0000 | ORAL_TABLET | Freq: Once | ORAL | Status: AC
  Administered 2020-08-31: 1 via ORAL
  Filled 2020-08-31: qty 1

## 2020-08-31 MED ORDER — FENTANYL CITRATE (PF) 100 MCG/2ML IJ SOLN
50.0000 ug | Freq: Once | INTRAMUSCULAR | Status: AC
  Administered 2020-08-31: 50 ug via INTRAVENOUS
  Filled 2020-08-31: qty 2

## 2020-08-31 NOTE — ED Provider Notes (Signed)
[facility] EMERGENCY DEPARTMENT Provider Note   CSN: [id number] Arrival date & time: 08/31/20  1542     History Chief Complaint  Patient presents with  . Fall    Patient reports lower back pain s/p mechanical fall this am at 0800. Able to ambulate to stretcher per EMS. Reports hitting posterior head. Currently on blood thinner. No LOC.     [name] [name] is a 86 y.o. male.  HPI 86 year old male presents with a fall and severe low back pain.  He states he was on the scale and his foot slipped when he was trying to get up and he fell backwards hitting a dresser.  He hit the back of his head but did not lose consciousness and has no headache.  He is having severe low back pain.  He also developed left lower quadrant abdominal pain last night that has been bothering him.  No other trauma including to his extremities or chest.  Took some Tylenol with no relief.  He is on warfarin but he is not sure why.   Past Medical History:  Diagnosis Date  . Acute on chronic systolic CHF (congestive heart failure) ([place]) 02/09/2017  . Acute respiratory failure with hypoxia ([place])   . AKI (acute kidney injury) ([place]) 08/20/2015  . Altered mental status 02/21/2017  . Aortic stenosis    a. mod-sev by echo 05/2016.  . Asthma   . Asthma exacerbation 08/20/2015  . Atrial fibrillation (HCC)    Paroxysmal; Echo in 2007-normal EF; mild LVH; LA enlargement; mild AS, minimal AI; neg. stress nuclear study in 2008   . BPH (benign prostatic hypertrophy) with urinary obstruction 09/27/2011   Transurethral resection of the prostate in 09/2011   . CAD (coronary artery disease), native coronary artery 06/28/2016  . Cardiogenic shock ([place])   . Carotid stenosis 04/25/2012   Carotid US 9/21: Bilateral ICA 1-39  . Cerebrovascular disease 07/2010   TIA; carotid ultrasound in 07/2010-significant bilateral plaque without focal internal carotid artery stenosis; MRI -encephalomalacia left temporal and right temporal lobes; small  inferior right cerebellar infarct; small vessel disease  . Chronic atrial fibrillation (HCC)    Paroxysmal; Echocardiogram in 2007-normal EF; mild LVH; left atrial enlargement; mild stenosis and minimal AI; negative stress nuclear study in 2008  . Chronic systolic CHF (congestive heart failure) ([place])    a. dx 05/2016 - EF 35-40%, diffuse HK, mod-severe AS, mod gradient, severe AVA VTI likely due to decreased cardiac output in setting of systolic dysfsunction and significant mitral regurgitation, mild MR, mod-severe MR, severe LAE, mild-mod RV dilation, mild RAE, mild-mod TR, mod PASP 76mmHg.  . CKD (chronic kidney disease), stage II   . Cognitive dysfunction 05/29/2015   I believe that this is related to his age as well as previous stroke   . Congestive heart failure (CHF) ([place]) 02/09/2017  . Degenerative joint disease    feet and legs  . Diabetes mellitus    no insulin; A1c of 6.6 in 2005  . Dizziness    occurs daily,especially in am  . Elevated troponin   . Exertional dyspnea   . Gastroesophageal reflux disease   . Hepatic steatosis   . History of noncompliance with medical treatment   . Hyperlipidemia    adverse reactions to statins and niacin  . Hypertension    Borderline  . Irregular heartbeat   . Mitral regurgitation    a. mod-sev by echo 05/2016  . Peripheral vascular disease ([place])   . Renal insufficiency   .  S/P TAVR (transcatheter aortic valve replacement) 07/19/2016   29 mm Edwards Sapien 3 transcatheter heart valve placed via left percutaneous transfemoral approach  . Senile purpura ([place]) 08/15/2018  . Temporal arteritis ([place])   . Tricuspid regurgitation    a. mild-mod TR by echo 05/2016  . VT (ventricular tachycardia) [facility])     Patient Active Problem List   Diagnosis Date Noted  . Late onset Alzheimer's disease with behavioral disturbance ([place]) 06/30/2020  . CKD stage 3 due to type 2 diabetes mellitus ([place]) 05/15/2020  . Diabetic peripheral neuropathy associated with  type 2 diabetes mellitus ([place]) 05/15/2020  . Hyperlipidemia associated with type 2 diabetes mellitus ([place]) 05/15/2020  . Dementia without behavioral disturbance ([place]) 05/15/2020  . Hypokalemia 05/15/2020  . Chronic insomnia 05/15/2020  . Acute on chronic HFrEF (heart failure with reduced ejection fraction) ([place]) 05/11/2020  . Acute on chronic systolic heart failure, NYHA class 3 ([place]) 05/11/2020  . Senile purpura ([place]) 08/15/2018  . Renal insufficiency 03/14/2018  . Altered mental status 02/21/2017  . Chronic systolic heart failure ([place]) 02/09/2017  . Controlled type 2 diabetes mellitus with hyperglycemia ([place]) 02/09/2017  . Elevated troponin   . S/P TAVR (transcatheter aortic valve replacement) 07/19/2016  . Mitral regurgitation   . VT (ventricular tachycardia) ([place])   . Acute respiratory failure with hypoxia ([place])   . Shortness of breath 06/28/2016  . CAD (coronary artery disease), native coronary artery 06/28/2016  . Acute on chronic combined systolic and diastolic CHF, NYHA class 4 ([place])   . Anemia of chronic disease 04/12/2016  . Orthostatic hypotension 08/20/2015  . AKI (acute kidney injury) ([place]) 08/20/2015  . Chronic asthma 08/20/2015  . Cognitive dysfunction 05/29/2015  . Chronic back pain 07/29/2014  . Lumbar pain 06/23/2014  . Chronic pain of right ankle 05/30/2014  . Diabetic neuropathy ([place]) 05/30/2014  . Chest congestion 05/02/2013  . Wheezing 05/02/2013  . Chronic constipation 04/30/2013  . Leg edema, left 04/27/2013  . Current use of long term anticoagulation 02/16/2013  . Carotid stenosis 04/25/2012  . BPH (benign prostatic hypertrophy) with urinary obstruction 09/27/2011  . Dyspnea on exertion 08/01/2011  . DM (diabetes mellitus), type 2 with renal complications ([place]) 81/82/9937  . Hypertension   . Atrial fibrillation ([place])   . Hyperlipidemia   . History of noncompliance with medical treatment   . Aortic stenosis   . Cerebrovascular disease 07/15/2010     Past Surgical History:  Procedure Laterality Date  . COLONOSCOPY  2002  . COLONOSCOPY  01/19/2012   Procedure: COLONOSCOPY;  Surgeon: Rogene Houston, MD;  Location[facility];  Service: Endoscopy;  Laterality: N/A;  1030  . LIPOMA EXCISION  1980  . ORIF ANKLE FRACTURE  2000   Right  . PERIPHERAL VASCULAR CATHETERIZATION N/A 07/11/2016   Procedure: Carotid PTA/Stent Intervention;  Surgeon: Lorretta Harp, MD;  Location: [facility];  Service: Cardiovascular;  Laterality: N/A;  . PROSTATE SURGERY  12/2011  . RIGHT HEART CATH AND CORONARY ANGIOGRAPHY N/A 05/11/2020   Procedure: RIGHT HEART CATH AND CORONARY ANGIOGRAPHY;  Surgeon: Nelva Bush, MD;  Location: [place] [facility];  Service: Cardiovascular;  Laterality: N/A;  . ROTATOR CUFF REPAIR     Right  . TEE WITHOUT CARDIOVERSION N/A 06/17/2016   Procedure: TRANSESOPHAGEAL ECHOCARDIOGRAM (TEE);  Surgeon: Jerline Pain, MD;  Location: [place];  Service: Cardiovascular;  Laterality: N/A;  . TEE WITHOUT CARDIOVERSION N/A 07/19/2016   Procedure: TRANSESOPHAGEAL ECHOCARDIOGRAM (TEE);  Surgeon: Legrand Como  Burt Knack, MD;  Location: [place];  Service: Open Heart Surgery;  Laterality: N/A;  . TRANSCATHETER AORTIC VALVE REPLACEMENT, TRANSFEMORAL N/A 07/19/2016   Procedure: TRANSCATHETER AORTIC VALVE REPLACEMENT, TRANSFEMORAL;  Surgeon: Sherren Mocha, MD;  Location: [place];  Service: Open Heart Surgery;  Laterality: N/A;  . TRANSURETHRAL RESECTION OF PROSTATE  09/2011  . URETHRAL STRICTURE DILATATION  1980s       Family History  Problem Relation Age of Onset  . Stroke Mother   . Diabetes Father   . Colon cancer Neg Hx     Social History   Tobacco Use  . Smoking status: Former Smoker    Packs/day: 1.00    Years: 20.00    Pack years: 20.00    Types: Cigarettes    Start date: 07/04/1950    Quit date: 04/25/1992    Years since quitting: 28.3  . Smokeless tobacco: Current User    Types: Chew  Substance Use Topics  . Alcohol  use: No    Alcohol/week: 0.0 standard drinks  . Drug use: No    Home Medications Prior to Admission medications   Medication Sig Start Date End Date Taking? Authorizing Provider  ALPRAZolam (XANAX) 0.25 MG tablet Take 1 tablet (0.25 mg total) by mouth 3 (three) times daily as needed for anxiety. 07/03/20 07/03/21 Yes Kathyrn Drown, MD  aspirin EC 81 MG EC tablet Take 1 tablet (81 mg total) by mouth daily. Swallow whole. 05/15/20  Yes Rosita Fire, Brittainy M, PA-C  bisoprolol (ZEBETA) 5 MG tablet Take 0.5 tablets (2.5 mg total) by mouth daily. 05/29/20  Yes Gerlene Fee, NP  cholecalciferol (VITAMIN D3) 25 MCG (1000 UNIT) tablet Take 1,000 Units by mouth in the morning and at bedtime.   Yes [provider]  digoxin (LANOXIN) 0.125 MG tablet Take 0.5 tablets (0.0625 mg total) by mouth daily. 06/11/20  Yes Simmons, Brittainy M, PA-C  furosemide (LASIX) 20 MG tablet TAKE (1) TABLET BY MOUTH ONCE DAILY. 08/26/20  Yes Luking, Elayne Snare, MD  insulin glargine (LANTUS SOLOSTAR) 100 UNIT/ML Solostar Pen Inject 32 Units into the skin daily.   Yes [provider]  isosorbide mononitrate (IMDUR) 30 MG 24 hr tablet Take 15 mg by mouth daily. 05/26/20  Yes [provider]  nortriptyline (PAMELOR) 10 MG capsule TAKE 1 CAPSULES AT BEDTIME FOR BURNING IN FEET.[28 IN PACKS/ NONE IN BOTTLE] 08/26/20  Yes Luking, Laruth Hanger A, MD  OLANZapine (ZYPREXA) 2.5 MG tablet TAKE 1 TABLET TWICE DAILY AS DIRECTED. 08/26/20  Yes Luking, Aquila Delaughter A, MD  psyllium (METAMUCIL) 58.6 % powder Take 1 packet by mouth daily.   Yes [provider]  rosuvastatin (CRESTOR) 5 MG tablet TAKE (1) TABLET BY MOUTH ONCE DAILY AT BEDTIME. 08/26/20  Yes Luking, Elayne Snare, MD  spironolactone (ALDACTONE) 25 MG tablet Take 0.5 tablets (12.5 mg total) by mouth daily. 07/02/20 09/30/20 Yes Bensimhon, Shaune Pascal, MD  warfarin (COUMADIN) 3 MG tablet TAKE (1) TABLET BY MOUTH EVERY SUNDAY,TUESDAY AND THURSDAY AT 4PM. Patient taking  differently: Take 3 mg by mouth See admin instructions. Take 1 tablet by mouth every Sunday,Tuesday and Thursday at 4 PM. 08/26/20  Yes Wolfgang Phoenix, Elayne Snare, MD  warfarin (COUMADIN) 6 MG tablet Take 1 tablet (6 mg total) by mouth every Monday, Wednesday, Friday, and Saturday at 6 PM. 05/29/20  Yes Green, Phylis Bougie, NP  albuterol (PROVENTIL) (2.5 MG/3ML) 0.083% nebulizer solution Take 3 mLs (2.5 mg total) by nebulization every 6 (six) hours as needed for wheezing  or shortness of breath. Patient not taking: Reported on 08/31/2020 05/29/20   Gerlene Fee, NP  amoxicillin-clavulanate (AUGMENTIN) 875-125 MG tablet Take 1 tablet by mouth 2 (two) times daily. One po bid x 7 days 08/31/20   Sherwood Gambler, MD  HYDROcodone-acetaminophen [facility]) 5-325 MG tablet Take 1 tablet by mouth every 6 (six) hours as needed for severe pain. 08/31/20   Sherwood Gambler, MD  potassium chloride SA (KLOR-CON) 20 MEQ tablet TAKE 1 TABLET BY MOUTH ON MONDAY, WEDNESDAY AND FRIDAY. Patient not taking: TAKE 1 TABLET BY MOUTH ON MONDAY, WEDNESDAY AND FRIDAY. 08/26/20   Kathyrn Drown, MD    Allergies    Ambien [zolpidem tartrate], Lipitor [atorvastatin calcium], Ranitidine, Simvastatin, Xanax xr [alprazolam er], Cholestatin, and Clopidogrel bisulfate  Review of Systems   Review of Systems  Respiratory: Negative for shortness of breath.   Gastrointestinal: Positive for abdominal pain.  Musculoskeletal: Positive for back pain. Negative for neck pain.  Neurological: Negative for weakness, numbness and headaches.  All other systems reviewed and are negative.   Physical Exam Updated Vital Signs BP (!) 151/89   Pulse 75   Temp (!) 96.3 F (35.7 C) (Axillary)   Resp 20   Ht 6\' 2"  (1.88 m)   Wt 96.6 kg   SpO2 94%   BMI 27.35 kg/m   Physical Exam Vitals and nursing note reviewed.  Constitutional:      Appearance: He is well-developed.  HENT:     Head: Normocephalic and atraumatic.     Comments: No signs of head  trauma    Right Ear: External ear normal.     Left Ear: External ear normal.     Nose: Nose normal.  Eyes:     General:        Right eye: No discharge.        Left eye: No discharge.  Cardiovascular:     Rate and Rhythm: Normal rate and regular rhythm.     Heart sounds: Normal heart sounds.  Pulmonary:     Effort: Pulmonary effort is normal.     Breath sounds: Normal breath sounds.  Abdominal:     Palpations: Abdomen is soft.     Tenderness: There is abdominal tenderness in the left lower quadrant.  Musculoskeletal:     Cervical back: Neck supple. No tenderness.     Thoracic back: No tenderness.     Lumbar back: Tenderness (diffuse) present.     Right hip: No tenderness. Normal range of motion.     Left hip: No tenderness. Normal range of motion.     Comments: There seems to be mild lower back tenderness but he is in significant pain when trying to sit up and has to roll over for the exam  Skin:    General: Skin is warm and dry.  Neurological:     Mental Status: He is alert and oriented to person, place, and time.     Comments: Awake, alert, oriented to person, place, time and situation. 5/5 strength in all 4 extremities. Grossly normal sensation.  Psychiatric:        Mood and Affect: Mood is not anxious.     ED Results / Procedures / Treatments   Labs (all labs ordered are listed, but only abnormal results are displayed) Labs Reviewed  COMPREHENSIVE METABOLIC PANEL - Abnormal; Notable for the following components:      Result Value   Sodium 134 (*)    Chloride 97 (*)  Glucose, Bld 232 (*)    Total Protein 8.2 (*)    All other components within normal limits  CBC WITH DIFFERENTIAL/PLATELET - Abnormal; Notable for the following components:   WBC 10.8 (*)    Neutro Abs 8.9 (*)    Abs Immature Granulocytes 0.08 (*)    All other components within normal limits  PROTIME-INR - Abnormal; Notable for the following components:   Prothrombin Time 22.4 (*)    INR 2.1 (*)     All other components within normal limits  LIPASE, BLOOD  URINALYSIS, ROUTINE W REFLEX MICROSCOPIC    EKG EKG Interpretation  Date/Time:  Monday August 31 2020 16:09:30 EDT Ventricular Rate:  76 PR Interval:    QRS Duration: 152 QT Interval:  437 QTC Calculation: 444 R Axis:   -51 Text Interpretation: Atrial fibrillation Multiple ventricular premature complexes Nonspecific IVCD with LAD LVH with secondary repolarization abnormality Confirmed by Sherwood Gambler [id number]) on 08/31/2020 4:22:48 PM   Radiology CT Head Wo Contrast  Result Date: 08/31/2020 CLINICAL DATA:  Fall, hit back of head EXAM: CT HEAD WITHOUT CONTRAST TECHNIQUE: Contiguous axial images were obtained from the base of the skull through the vertex without intravenous contrast. COMPARISON:  06/10/2020 FINDINGS: Brain: There is atrophy and chronic small vessel disease changes. Old bilateral temporal infarcts. Old bilateral cerebellar infarcts. No acute intracranial abnormality. Specifically, no hemorrhage, hydrocephalus, mass lesion, acute infarction, or significant intracranial injury. Vascular: No hyperdense vessel or unexpected calcification. Skull: No acute calvarial abnormality. Sinuses/Orbits: Visualized paranasal sinuses and mastoids clear. Orbital soft tissues unremarkable. Other: None IMPRESSION: Old bilateral temporal and cerebellar infarcts. Atrophy, chronic microvascular disease. No acute intracranial abnormality. Electronically Signed   By: Rolm Baptise M.D.   On: 08/31/2020 18:53   CT Cervical Spine Wo Contrast  Result Date: 08/31/2020 CLINICAL DATA:  Fall, neck trauma, EXAM: CT CERVICAL SPINE WITHOUT CONTRAST TECHNIQUE: Multidetector CT imaging of the cervical spine was performed without intravenous contrast. Multiplanar CT image reconstructions were also generated. COMPARISON:  None. FINDINGS: Alignment: No subluxation Skull base and vertebrae: No acute fracture. No primary bone lesion or focal pathologic  process. Soft tissues and spinal canal: No prevertebral fluid or swelling. No visible canal hematoma. Disc levels: Degenerative disc changes at C5-6 and C6-7 with disc space narrowing and spurring. Mild bilateral degenerative facet disease diffusely. Upper chest: Biapical scarring. Other: None IMPRESSION: Degenerative disc and facet disease.  No acute bony abnormality. Electronically Signed   By: Rolm Baptise M.D.   On: 08/31/2020 18:56   CT ABDOMEN PELVIS W CONTRAST  Result Date: 08/31/2020 CLINICAL DATA:  Low back pain following fall, initial encounter EXAM: CT ABDOMEN AND PELVIS WITH CONTRAST TECHNIQUE: Multidetector CT imaging of the abdomen and pelvis was performed using the standard protocol following bolus administration of intravenous contrast. CONTRAST:  182mL OMNIPAQUE IOHEXOL 300 MG/ML  SOLN COMPARISON:  02/09/2017 FINDINGS: Lower chest: Lung bases demonstrate mild fibrotic change without acute infiltrate or sizable effusion. Changes of prior TAVR are noted. Hepatobiliary: Fatty infiltration of the liver is noted. The gallbladder is within normal limits. Pancreas: Unremarkable. No pancreatic ductal dilatation or surrounding inflammatory changes. Spleen: Normal in size without focal abnormality. Adrenals/Urinary Tract: Adrenal glands are within normal limits. Kidneys demonstrate renal cystic change bilaterally. No renal calculi or obstructive changes are noted. The bladder is partially distended. Stomach/Bowel: Scattered diverticular change of the colon is noted. Minimal inflammatory changes are noted along the junction of the descending colon and sigmoid which may represent some early diverticulitis.  No perforation or abscess is seen. The appendix is within normal limits. No inflammatory changes are seen. Small bowel and stomach are unremarkable. Vascular/Lymphatic: Aortic atherosclerosis. No enlarged abdominal or pelvic lymph nodes. Reproductive: Prostate is unremarkable. Other: No abdominal wall  hernia or abnormality. No abdominopelvic ascites. Musculoskeletal: Degenerative changes of lumbar spine are seen. Chronic L2 compression deformity is noted. IMPRESSION: Changes of very early diverticulitis without perforation or abscess formation as described. Chronic changes without other acute abnormality. Electronically Signed   By: Inez Catalina M.D.   On: 08/31/2020 18:57   CT L-SPINE NO CHARGE  Result Date: 08/31/2020 CLINICAL DATA:  Fall today.  Back pain. EXAM: CT LUMBAR SPINE WITHOUT CONTRAST TECHNIQUE: Multidetector CT imaging of the lumbar spine was performed without intravenous contrast administration. Multiplanar CT image reconstructions were also generated. COMPARISON:  Lumbar spine radiographs 07/10/2018 FINDINGS: Segmentation: Normal Alignment: Normal Vertebrae: Mild to moderate compression fracture L2 unchanged from the prior study. No acute fracture or mass. Schmorl's node superior endplates of L4 and L5 Paraspinal and other soft tissues: Atherosclerotic aorta without aneurysm. No paraspinous mass or adenopathy. Disc levels: T12-L1: Mild disc degeneration.  Negative for stenosis. L1-2: Mild disc bulging and mild facet degeneration. Negative for stenosis L2-3: Disc bulging and mild to moderate facet degeneration. Mild spinal stenosis. L3-4: Disc degeneration with Schmorl's node in the superior endplate of L4. Diffuse disc bulging and bilateral facet degeneration. No significant stenosis L4-5: Mild disc bulging. Moderate facet hypertrophy bilaterally. Mild spinal stenosis. L5-S1: Mild facet degeneration.  No significant stenosis. IMPRESSION: Negative for acute fracture.  Chronic fracture L2 vertebral body. Lumbar degenerative changes above. Mild spinal stenosis L2-3 and L4-5. Electronically Signed   By: Franchot Gallo M.D.   On: 08/31/2020 19:06    Procedures Procedures (including critical care time)  Medications Ordered in ED Medications  fentaNYL (SUBLIMAZE) injection 50 mcg (50 mcg  Intravenous Given 08/31/20 1622)  iohexol (OMNIPAQUE) 300 MG/ML solution 100 mL (100 mLs Intravenous Contrast Given 08/31/20 1754)  HYDROcodone-acetaminophen (NORCO/VICODIN) 5-325 MG per tablet 1 tablet (1 tablet Oral Given 08/31/20 1929)  amoxicillin-clavulanate (AUGMENTIN) 875-125 MG per tablet 1 tablet (1 tablet Oral Given 08/31/20 1929)    ED Course  I have reviewed the triage vital signs and the nursing notes.  Pertinent labs & imaging results that were available during my care of the patient were reviewed by me and considered in my medical decision making (see chart for details).    MDM Rules/Calculators/A&P                          Patient's work-up thankfully shows negative head CT and C-spine CT.  No significant traumatic injury including his L-spine.  He does appear to have early diverticulitis without complication.  This explains his left lower quadrant pain.  No vomiting and he tolerated oral Augmentin.  I think Augmentin is a better option than Cipro/Flagyl with his Coumadin.  I did discuss how this can alter his effect of warfarin and he needs to get his INR rechecked sooner than typical.  He is able to get up and walk with assistance and he has good help at home according to him and his niece.  He would like to go home and thus will discharge home with pain control and antibiotics. Final Clinical Impression(s) / ED Diagnoses Final diagnoses:  Fall, initial encounter  Acute diverticulitis  Strain of lumbar region, initial encounter    Rx / DC Orders ED  Discharge Orders         Ordered    HYDROcodone-acetaminophen (NORCO) 5-325 MG tablet  Every 6 hours PRN        08/31/20 2124    amoxicillin-clavulanate (AUGMENTIN) 875-125 MG tablet  2 times daily        08/31/20 2124           Sherwood Gambler, MD 08/31/20 2144

## 2020-08-31 NOTE — Telephone Encounter (Signed)
[name] calling about multiple prescriptions.  She said the pharmacy still does not have Bisoprolol, Nortriptylyne and Warfarin.  She wants the Warfarin changed to 6 mg so they can cut in half.  She said that pharmacy keeps filling the Potassium but that he hasn't been on that a while so she wants to make sure he isn't suppose to be taking that still?  [place].

## 2020-08-31 NOTE — ED Notes (Signed)
Attempted to ambulated patient after pain medication was given. Pt took 2 steps with 2 person assistance as well as wheeled walker. Pt had difficulty standing without maximum assistance and swayed back and forth during ambulation attempt.

## 2020-08-31 NOTE — Discharge Instructions (Addendum)
You are being prescribed Augmentin, which can alter the effects of your warfarin/Coumadin.  Be sure to get your INR checked in the next 3 days.  If you develop worsening, continued, or recurrent abdominal pain, uncontrolled vomiting, fever, chest or back pain, or any other new/concerning symptoms then return to the ER for evaluation.

## 2020-08-31 NOTE — Telephone Encounter (Signed)
Nurses Please call patient Verify which medicines patient taking which ones they are not taking and which ones they need refills on  As for the potassium please inquire is patient taking potassium currently if so what days of the week etc.  Once we have this information we will be able to send in some refills.  Thank you

## 2020-08-31 NOTE — ED Notes (Signed)
Attempted to ambulate pt. Pt extremely weak with difficulty standing without assistance.

## 2020-09-01 ENCOUNTER — Telehealth: Payer: Self-pay

## 2020-09-01 DIAGNOSIS — R531 Weakness: Secondary | ICD-10-CM

## 2020-09-01 NOTE — Telephone Encounter (Signed)
Nile Dear called and wanted to know if Dr Nicki Reaper can get Volanda Napoleon into some physical therapy at the [facility] he fell three past two day ambulance had to come. There is a order for in home therapy but no one has reached out and she is wanting to see if he can get into the [facility].   [name] call back [phone]

## 2020-09-01 NOTE — Telephone Encounter (Signed)
Nurses Please call and talk with [facility] Unfortunately [facility] is not taking in any outpatient admissions They are taking in patients who have been hospitalized for several days but not straight referrals from the home Unfortunately if patient gets dramatically worse there is no other options but to go back to the ER If they would like for [place] to consult home health to come out and see him back to be done. I can do a home visit later in the week.(My schedule is such to where I cannot do that today or tomorrow) Please see what they would like to do

## 2020-09-01 NOTE — Telephone Encounter (Signed)
Go ahead send in 4 months refill on nortriptyline and warfarin Recommend discontinuing potassium from the refill list also notify pharmacy of this

## 2020-09-01 NOTE — Telephone Encounter (Signed)
Spoke with [name]. Urgent referral placed for home health. [name] verbalized understanding if pt gets worse to take pt to Er.

## 2020-09-01 NOTE — Telephone Encounter (Signed)
[name] [name] states they are only accepting referrals from the hospital right now and as for anywhere else there would have to be a authorization from his insurance.

## 2020-09-01 NOTE — Telephone Encounter (Signed)
Patient niece(Barbara) is wanting some help getting him into [facility]. She states patient is unable to use his legs. Please advise

## 2020-09-01 NOTE — Telephone Encounter (Signed)
Spoke with [name]. [name] states that for right now it is temporary until he can get back on feet. Right now family is having to have 2 people stay around the clock. Pt is very weak and feet will not move. [name] states pt is "all out of sorts again". Pt was taken to ER last night for diverticulits and was given amoxicillin and pain med. [name] is not giving pain med only giving Tylenol q 6 hrs (2 tablets). Pt has fallen multiple times and the EMS has had to be called to help them. Left message on admissions voicemail [facility].

## 2020-09-01 NOTE — Telephone Encounter (Signed)
Nurses first clarify with [name] is she seeking to have him placed into the [facility]?  If so for how long?  Temporary or permanent?  Also clarify with [name] has gotten to the point where they are no longer able to take care of him?    Nurses please call [facility] find out from them what is the process for the patient to be admitted there when they feel like they can no longer care for the patient at home-you may have to speak with someone within the administration or admitting thank you

## 2020-09-02 ENCOUNTER — Other Ambulatory Visit: Payer: Self-pay | Admitting: *Deleted

## 2020-09-02 MED ORDER — BISOPROLOL FUMARATE 5 MG PO TABS
2.5000 mg | ORAL_TABLET | Freq: Every day | ORAL | 2 refills | Status: DC
Start: 2020-09-02 — End: 2020-10-20

## 2020-09-02 MED ORDER — WARFARIN SODIUM 6 MG PO TABS
ORAL_TABLET | ORAL | 2 refills | Status: DC
Start: 2020-09-02 — End: 2020-12-22

## 2020-09-02 MED ORDER — NORTRIPTYLINE HCL 10 MG PO CAPS
ORAL_CAPSULE | ORAL | 2 refills | Status: DC
Start: 2020-09-02 — End: 2020-12-22

## 2020-09-02 NOTE — Telephone Encounter (Signed)
OK to send 6 mg Warfarin to pharmacy? Please advise. Thank you

## 2020-09-02 NOTE — Telephone Encounter (Signed)
Refills sent and [name] was notified.

## 2020-09-02 NOTE — Telephone Encounter (Signed)
May have 3 months refill thank you

## 2020-09-03 ENCOUNTER — Other Ambulatory Visit: Payer: Self-pay

## 2020-09-03 ENCOUNTER — Other Ambulatory Visit (INDEPENDENT_AMBULATORY_CARE_PROVIDER_SITE_OTHER): Payer: PPO | Admitting: *Deleted

## 2020-09-03 DIAGNOSIS — Z7901 Long term (current) use of anticoagulants: Secondary | ICD-10-CM

## 2020-09-03 LAB — POCT INR: INR: 3 (ref 2.0–3.0)

## 2020-09-23 ENCOUNTER — Other Ambulatory Visit: Payer: Self-pay | Admitting: Family Medicine

## 2020-09-23 ENCOUNTER — Ambulatory Visit (INDEPENDENT_AMBULATORY_CARE_PROVIDER_SITE_OTHER): Payer: PPO | Admitting: Family Medicine

## 2020-09-23 ENCOUNTER — Other Ambulatory Visit: Payer: Self-pay

## 2020-09-23 ENCOUNTER — Encounter: Payer: Self-pay | Admitting: Family Medicine

## 2020-09-23 VITALS — BP 114/68 | HR 82 | Temp 97.1°F | Wt 207.4 lb

## 2020-09-23 DIAGNOSIS — I4811 Longstanding persistent atrial fibrillation: Secondary | ICD-10-CM | POA: Diagnosis not present

## 2020-09-23 DIAGNOSIS — E1122 Type 2 diabetes mellitus with diabetic chronic kidney disease: Secondary | ICD-10-CM

## 2020-09-23 DIAGNOSIS — F09 Unspecified mental disorder due to known physiological condition: Secondary | ICD-10-CM

## 2020-09-23 DIAGNOSIS — I5022 Chronic systolic (congestive) heart failure: Secondary | ICD-10-CM | POA: Diagnosis not present

## 2020-09-23 DIAGNOSIS — N1832 Chronic kidney disease, stage 3b: Secondary | ICD-10-CM | POA: Diagnosis not present

## 2020-09-23 DIAGNOSIS — R54 Age-related physical debility: Secondary | ICD-10-CM | POA: Diagnosis not present

## 2020-09-23 DIAGNOSIS — R296 Repeated falls: Secondary | ICD-10-CM

## 2020-09-23 DIAGNOSIS — Z7901 Long term (current) use of anticoagulants: Secondary | ICD-10-CM | POA: Diagnosis not present

## 2020-09-23 DIAGNOSIS — Z23 Encounter for immunization: Secondary | ICD-10-CM | POA: Diagnosis not present

## 2020-09-23 DIAGNOSIS — Z794 Long term (current) use of insulin: Secondary | ICD-10-CM

## 2020-09-23 LAB — POCT INR: INR: 1.7 — AB (ref 2.0–3.0)

## 2020-09-23 NOTE — Progress Notes (Signed)
Subjective:    Patient ID: [name] [name], male    DOB: [birth year], 86 y.o.   MRN: [id number]  HPI Encounter for current long-term use of anticoagulants - Plan: POCT INR  Need for vaccination - Plan: Flu Vaccine QUAD High Dose(Fluad)  Longstanding persistent atrial fibrillation (HCC)  Chronic systolic heart failure (HCC)  Type 2 diabetes mellitus with stage 3b chronic kidney disease, with long-term current use of insulin (HCC)  Cognitive dysfunction  This is a very nice gentleman.  He has diabetes which is sometimes difficult to control.  He does try to do the best he can at watching how he eats.  His niece states he is just not eating as well as he used to.  In addition to this he is having frequent falls related to weakness in his legs he has not had any injuries but they have had to call the ambulance multiple times to help  In addition to this also having difficult times with low blood pressure although he is on minimal amount of medicines to control his heart  He also has chronic anticoagulation and it is getting closer to the point where that risk of this outweighs the benefit  He does have some cognitive dysfunction and has overall frailty in my opinion it is not safe for him to drive.  This has not gone over well with him.   Results for orders placed or performed in visit on 09/23/20  POCT INR  Result Value Ref Range   INR 1.7 (A) 2.0 - 3.0   Family has not heard anything regarding home health referral- it looks like they have had trouble getting patient accepted but it is being worked on. Review of Systems Please see above    Objective:   Physical Exam Heart rate controlled irregular pulses are normal blood pressure on the low side at 94/64 extremities no edema skin warm dry Adjust the Coumadin we will recheck again in 4 weeks     Assessment & Plan:  1. Encounter for current long-term use of anticoagulants INR was checked we adjusted it try to keep the INR  between 2.0-3.0 - POCT INR  2. Need for vaccination Flu shot today - Flu Vaccine QUAD High Dose(Fluad)  3. Longstanding persistent atrial fibrillation ([place]) I do not find evidence of severe failure currently I am concerned about patient's underlying condition and his relatively low blood pressure but he is already on a reduced amount of medicine he was encouraged to try to quit drinking and he may use a small amount of salt as long as he is not having swelling of the lower legs  4. Chronic systolic heart failure (HCC) Significant heart failure although stable  5. Type 2 diabetes mellitus with stage 3b chronic kidney disease, with long-term current use of insulin (HCC) Diabetes sugar readings look respectable continue current measures.  Avoid blood sugars  6. Cognitive dysfunction I do not feel it is wise for this gentleman to drive I encouraged his family to do everything for him  7. Frequent falls Patient is rapidly losing strength having frequent falls the importance of using his Merlyn Conley was stressed they utilize a bedside commode  8. Frailty Rapidly he is getting weaker but this is causing significant issues this patient may well need to be in assisted living or nursing home in the near future we have tried to consult home health apparently for some reason that consult felt throat we will reinitiate physical therapy at home long-term  it is doubtful he will be able to stay at home difficult situation  Underlying renal disease followed by the kidney doctor on a regular basis apparently they have him on a low-dose farxiag

## 2020-09-24 ENCOUNTER — Other Ambulatory Visit: Payer: Self-pay | Admitting: Family Medicine

## 2020-09-24 ENCOUNTER — Other Ambulatory Visit: Payer: Self-pay | Admitting: *Deleted

## 2020-09-24 DIAGNOSIS — E119 Type 2 diabetes mellitus without complications: Secondary | ICD-10-CM | POA: Diagnosis not present

## 2020-09-24 DIAGNOSIS — R531 Weakness: Secondary | ICD-10-CM

## 2020-09-24 NOTE — Progress Notes (Signed)
09/24/20-sent message to [facility].

## 2020-09-24 NOTE — Progress Notes (Signed)
Urgent referral put in for home health

## 2020-09-25 NOTE — Telephone Encounter (Signed)
Please talk with niece [name] I am assuming that they are using 6 mg on Sundays and 3 mg tablets on other days?  If so may have appropriate number of 3 mg based on his current prescription sent as a monthly allotment with 6 refills Very important if they are doing it this way that they do not get this mixed up

## 2020-09-25 NOTE — Telephone Encounter (Signed)
Discussed with [name] and she verbalized understanding.

## 2020-09-25 NOTE — Telephone Encounter (Signed)
Per Dr Nicki Reaper: Patient needs to stick with administration instructions given at visit 09/23/20 and stick with the 6mg  tablets(refuse the 3mg  tablets to avoid confusion)  Patient should take  Coumadin 6mg  1/2 tablet Sunday, Tuesday, and Friday with a whole tablet Monday, Wednesday, Thursday and Saturday.  Recheck INR in 4 weeks

## 2020-09-30 ENCOUNTER — Ambulatory Visit: Payer: PPO | Admitting: Family Medicine

## 2020-10-01 ENCOUNTER — Telehealth: Payer: Self-pay | Admitting: Family Medicine

## 2020-10-01 NOTE — Telephone Encounter (Signed)
Discussed with pt. Pt verbalized understanding.  °

## 2020-10-01 NOTE — Telephone Encounter (Signed)
Pt nephew Richardson Landry calling in office. Pt did an at home test today and tested positive for COVID. Pt is having sore throat, nasal congestion, tired, aches and pains, flu like symptoms. No temp, no headache. Breathing OK. Stayed in bed all day yesterday.   Nephew is not sure what he needs to do because he is suppose to have surgery on 10/12/20 and does not want to have to postpone surgery. [name] also tested positive. Should pt nephew call ambulance or can they have someone come out to home to give infusion or what does he need to do?   Please advise. Thank you Call back number [phone]

## 2020-10-01 NOTE — Telephone Encounter (Signed)
1.  He is considered high risk  #2 he should be seen tomorrow as a car visit this could be done around 11:45 AM and in his situation I would just see him at the car because it would be very difficult for him to get out Obviously if he is too weak to come in the car then please see #3 #3 if he is having severe shortness of breath, passing out, too weak to move, frequent vomiting, then in that situation he should go to the ER In those situations the ER would evaluate to see if it is necessary for him to be admitted and treated with IV remdemsivir or if they felt he was stable they would end up sending him home with doing a outpatient infusion at the infusion center in [place] #4 it would be wise to call the infusion center to give them the information.  But first please talk with his nephew to see if they would want the infusion center to call the nephew rather than BAs number.  Infusion center [phone]  Please handle the situation and keep me posted on what is going on thank you

## 2020-10-01 NOTE — Telephone Encounter (Signed)
[name] [name] contacted and verbalized understanding. [name] [name] states that he is having surgery on the 29th and does not want to risk getting COVID. Niece [name] is positive also so it would be hard getting patient here for car visit. Informed [name] [name] that if BA had any symptoms stated in message to take him to ER; [name] [name] verbalized understanding. Left pt name, DOB, positive test date and [place] number on infusion hotline.

## 2020-10-01 NOTE — Telephone Encounter (Signed)
FYI It would be wise to advise Richardson Landry to call his doctor to find out what he should do for himself given that [name] is positive thank you

## 2020-10-02 ENCOUNTER — Other Ambulatory Visit: Payer: Self-pay | Admitting: Nurse Practitioner

## 2020-10-02 DIAGNOSIS — U071 COVID-19: Secondary | ICD-10-CM

## 2020-10-02 NOTE — Progress Notes (Signed)
I connected by phone with Nile Dear, patient's POA, on 10/02/2020 at 8:52 AM to discuss the potential use of a treatment for mild to moderate COVID-19 viral infection in non-hospitalized patients.  This patient is a 86 y.o. male that meets the FDA criteria for Emergency Use Authorization of bamlanivimab/etesevimab, casirivimab\imdevimab, or sotrovimab  Has a (+) direct SARS-CoV-2 viral test result  Has mild or moderate COVID-19   Is ? 86 years of age and weighs ? 40 kg  Is NOT hospitalized due to COVID-19  Is NOT requiring oxygen therapy or requiring an increase in baseline oxygen flow rate due to COVID-19  Is within 10 days of symptom onset  Has at least one of the high risk factor(s) for progression to severe COVID-19 and/or hospitalization as defined in EUA.  Specific high risk criteria : Older age (>/= 84 yo), BMI > 25, Cardiovascular disease or hypertension, Chronic Lung Disease and Other high risk medical condition per CDC:  SVI  Patient has dementia and will be coming with his family member who is also positive for covid to infusion. She consented on his behalf.   I have spoken and communicated the following to the patient or parent/caregiver:  1. FDA has authorized the emergency use of bamlanivimab/etesevimab, casirivimab\imdevimab, or sotrovimab for the treatment of mild to moderate COVID-19 in adults and pediatric patients with positive results of direct SARS-CoV-2 viral testing who are 72 years of age and older weighing at least 40 kg, and who are at high risk for progressing to severe COVID-19 and/or hospitalization.  2. The significant known and potential risks and benefits of bamlanivimab/etesevimab, casirivimab\imdevimab, or sotrovimab, and the extent to which such potential risks and benefits are unknown.  3. Information on available alternative treatments and the risks and benefits of those alternatives, including clinical trials.  4. Patients treated with  bamlanivimab/etesevimab, casirivimab\imdevimab, or sotrovimab should continue to self-isolate and use infection control measures (e.g., wear mask, isolate, social distance, avoid sharing personal items, clean and disinfect "high touch" surfaces, and frequent handwashing) according to CDC guidelines.   5. The patient or parent/caregiver has the option to accept or refuse bamlanivimab/etesevimab, casirivimab\imdevimab, or sotrovimab.  After reviewing this information with the patient, the patient has agreed to receive one of the available covid 19 monoclonal antibodies and will be provided an appropriate fact sheet prior to infusion.Beckey Rutter, [place], AGNP-C [phone] ([place])

## 2020-10-03 ENCOUNTER — Ambulatory Visit (HOSPITAL_COMMUNITY)
Admission: RE | Admit: 2020-10-03 | Discharge: 2020-10-03 | Disposition: A | Discharge status: Home / Self Care | Payer: PPO | Source: Ambulatory Visit | Attending: Pulmonary Disease | Admitting: Pulmonary Disease

## 2020-10-03 DIAGNOSIS — Z23 Encounter for immunization: Secondary | ICD-10-CM | POA: Insufficient documentation

## 2020-10-03 DIAGNOSIS — U071 COVID-19: Secondary | ICD-10-CM | POA: Diagnosis present

## 2020-10-03 DIAGNOSIS — J989 Respiratory disorder, unspecified: Secondary | ICD-10-CM | POA: Diagnosis not present

## 2020-10-03 DIAGNOSIS — I10 Essential (primary) hypertension: Secondary | ICD-10-CM | POA: Insufficient documentation

## 2020-10-03 DIAGNOSIS — R54 Age-related physical debility: Secondary | ICD-10-CM | POA: Diagnosis not present

## 2020-10-03 MED ORDER — FAMOTIDINE IN NACL 20-0.9 MG/50ML-% IV SOLN: 20.0000 mg | Freq: Once | INTRAVENOUS | Status: DC | PRN

## 2020-10-03 MED ORDER — DIPHENHYDRAMINE HCL 50 MG/ML IJ SOLN: 50.0000 mg | Freq: Once | INTRAMUSCULAR | Status: DC | PRN

## 2020-10-03 MED ORDER — EPINEPHRINE 0.3 MG/0.3ML IJ SOAJ: 0.3000 mg | Freq: Once | INTRAMUSCULAR | Status: DC | PRN

## 2020-10-03 MED ORDER — ALBUTEROL SULFATE HFA 108 (90 BASE) MCG/ACT IN AERS: 2.0000 | INHALATION_SPRAY | Freq: Once | RESPIRATORY_TRACT | Status: DC | PRN

## 2020-10-03 MED ORDER — SOTROVIMAB 500 MG/8ML IV SOLN
500.0000 mg | Freq: Once | INTRAVENOUS | Status: AC
  Administered 2020-10-03: 500 mg via INTRAVENOUS

## 2020-10-03 MED ORDER — SODIUM CHLORIDE 0.9 % IV SOLN: INTRAVENOUS | Status: DC | PRN

## 2020-10-03 MED ORDER — METHYLPREDNISOLONE SODIUM SUCC 125 MG IJ SOLR: 125.0000 mg | Freq: Once | INTRAMUSCULAR | Status: DC | PRN

## 2020-10-03 NOTE — Discharge Instructions (Signed)

## 2020-10-03 NOTE — Progress Notes (Signed)
Diagnosis: COVID-19  Physician: Dr. Asencion Noble  Procedure: Covid Infusion Clinic Med: Sotrovimab infusion - Provided patient with sotrovimab fact sheet for patients, parents, and caregivers prior to infusion. Patient and caregiver were informed that there will be administrative charges related to today's infusion. His niece was notified of the same, she is power of attorney. She wishes to have the infusion performed.     Complications: No immediate complications noted  Discharge: Discharged home  If after the infusion you have any questions or concerns please call the Advanced Practice Provider at [place] 10/03/2020

## 2020-10-16 ENCOUNTER — Other Ambulatory Visit: Payer: Self-pay | Admitting: Family Medicine

## 2020-10-20 ENCOUNTER — Other Ambulatory Visit: Payer: Self-pay | Admitting: Family Medicine

## 2020-10-21 ENCOUNTER — Other Ambulatory Visit: Payer: PPO | Admitting: Family Medicine

## 2020-10-21 ENCOUNTER — Other Ambulatory Visit: Payer: Self-pay

## 2020-10-21 ENCOUNTER — Other Ambulatory Visit: Payer: PPO

## 2020-10-21 ENCOUNTER — Ambulatory Visit (INDEPENDENT_AMBULATORY_CARE_PROVIDER_SITE_OTHER): Payer: PPO | Admitting: Family Medicine

## 2020-10-21 DIAGNOSIS — I739 Peripheral vascular disease, unspecified: Secondary | ICD-10-CM | POA: Diagnosis not present

## 2020-10-21 DIAGNOSIS — E1142 Type 2 diabetes mellitus with diabetic polyneuropathy: Secondary | ICD-10-CM

## 2020-10-21 DIAGNOSIS — Z7901 Long term (current) use of anticoagulants: Secondary | ICD-10-CM

## 2020-10-21 DIAGNOSIS — D72829 Elevated white blood cell count, unspecified: Secondary | ICD-10-CM | POA: Diagnosis not present

## 2020-10-21 LAB — POCT INR: INR: 2.7 (ref 2.0–3.0)

## 2020-10-21 NOTE — Progress Notes (Signed)
   Subjective:    Patient ID: [name] [name], male    DOB: 1932-05-28, 86 y.o.   MRN: [id number]  HPIpt arrives for INR check and has some concerns.   Results for orders placed or performed in visit on 10/21/20  POCT INR  Result Value Ref Range   INR 2.7 2.0 - 3.0   Patient finds himself feeling fatigued and tired at times other times he finds himself feeling pretty good he denies any sweats chills wheezing difficulty breathing family is concerned that his B12 level is low because of his fatigue in addition to this they find that his appetite seems to be okay his sugars are running a little higher than what they have noticed but they are trying to do the best he can at minimizing starches no hallucination issues recently   Review of Systems    Please see above Objective:   Physical Exam Heart irregular rate is controlled lungs are clear no crackles extremities no edema skin warm dry neurologic grossly normal patient is very nice he asked if he can drive I told him it is not possible for him to drive  Patient with diabetic peripheral neuropathy in the feet on exam     Assessment & Plan:  He should not drive family is aware Diabetes subpar control very important to recheck an A1c they will do lab work again next week After we get lab work patient may need adjustment to his diabetes approach At the same time we do not want to be too aggressive, we need to avoid low blood sugars  B12 possibly low will check level at the request of family Fatigue tiredness related to his underlying condition INR looks good follow-up again in 4 weeks for recheck follow-up office visit in February  Patient does have history of peripheral vascular disease I find no evidence of any ulcers on his lower legs color looks good patient does get some leg pain with activity but not severe

## 2020-10-22 ENCOUNTER — Other Ambulatory Visit: Payer: Self-pay | Admitting: *Deleted

## 2020-10-22 DIAGNOSIS — E1122 Type 2 diabetes mellitus with diabetic chronic kidney disease: Secondary | ICD-10-CM

## 2020-10-22 DIAGNOSIS — I10 Essential (primary) hypertension: Secondary | ICD-10-CM

## 2020-10-22 DIAGNOSIS — Z794 Long term (current) use of insulin: Secondary | ICD-10-CM

## 2020-10-22 DIAGNOSIS — E1142 Type 2 diabetes mellitus with diabetic polyneuropathy: Secondary | ICD-10-CM

## 2020-10-22 NOTE — Progress Notes (Signed)
Labs ordered.

## 2020-10-28 DIAGNOSIS — Z794 Long term (current) use of insulin: Secondary | ICD-10-CM | POA: Diagnosis not present

## 2020-10-28 DIAGNOSIS — E1122 Type 2 diabetes mellitus with diabetic chronic kidney disease: Secondary | ICD-10-CM | POA: Diagnosis not present

## 2020-10-28 DIAGNOSIS — I10 Essential (primary) hypertension: Secondary | ICD-10-CM | POA: Diagnosis not present

## 2020-10-28 DIAGNOSIS — E1142 Type 2 diabetes mellitus with diabetic polyneuropathy: Secondary | ICD-10-CM | POA: Diagnosis not present

## 2020-10-28 DIAGNOSIS — N1832 Chronic kidney disease, stage 3b: Secondary | ICD-10-CM | POA: Diagnosis not present

## 2020-10-29 LAB — BASIC METABOLIC PANEL
BUN/Creatinine Ratio: 9 — ABNORMAL LOW (ref 10–24)
BUN: 9 mg/dL (ref 8–27)
CO2: 22 mmol/L (ref 20–29)
Calcium: 9.3 mg/dL (ref 8.6–10.2)
Chloride: 98 mmol/L (ref 96–106)
Creatinine, Ser: 1.03 mg/dL (ref 0.76–1.27)
GFR calc Af Amer: 76 mL/min/{1.73_m2} (ref >59)
GFR calc non Af Amer: 65 mL/min/{1.73_m2} (ref >59)
Glucose: 155 mg/dL — ABNORMAL HIGH (ref 65–99)
Potassium: 4.1 mmol/L (ref 3.5–5.2)
Sodium: 138 mmol/L (ref 134–144)

## 2020-10-29 LAB — VITAMIN B12: Vitamin B-12: 322 pg/mL (ref 232–1245)

## 2020-10-29 LAB — HEMOGLOBIN A1C
Est. average glucose Bld gHb Est-mCnc: 203 mg/dL
Hgb A1c MFr Bld: 8.7 % — ABNORMAL HIGH (ref 4.8–5.6)

## 2020-11-18 ENCOUNTER — Other Ambulatory Visit: Payer: Self-pay

## 2020-11-18 ENCOUNTER — Other Ambulatory Visit (INDEPENDENT_AMBULATORY_CARE_PROVIDER_SITE_OTHER): Payer: PPO | Admitting: *Deleted

## 2020-11-18 DIAGNOSIS — Z7901 Long term (current) use of anticoagulants: Secondary | ICD-10-CM

## 2020-11-18 LAB — POCT INR: INR: 1.5 — AB (ref 2.0–3.0)

## 2020-11-18 NOTE — Progress Notes (Signed)
inr

## 2020-11-19 ENCOUNTER — Telehealth: Payer: Self-pay | Admitting: *Deleted

## 2020-11-19 NOTE — Telephone Encounter (Signed)
Patient came in for INR yesterday and brought a list of glucose readings -left in Dr. Roby Lofts folder. Wanting to know if he needs to continue to keep these 4 x per day and if so will need a refill on strips.

## 2020-11-20 NOTE — Telephone Encounter (Signed)
I reviewed over his readings Metformin 500 mg 1/2 tablet daily, #15, 6 refills Continue insulin as prescribed May back off on checking sugars 2 a couple times a week in the morning and a couple times a week toward supper or bedtime May have refill of strips With testing for 3 times daily because of insulin may have a month supply with 12 refills

## 2020-11-23 MED ORDER — METFORMIN HCL 500 MG PO TABS: ORAL_TABLET | ORAL | 6 refills | Status: DC

## 2020-11-23 NOTE — Addendum Note (Signed)
Addended by: Vicente Males on: 11/23/2020 09:11 AM   Modules accepted: Orders

## 2020-11-23 NOTE — Telephone Encounter (Signed)
Pt niece [name] contacted and verbalized understanding. Metformin sent into pharmacy, glucose strips written out on script pad and awaiting signature. Will fax once signed.

## 2020-11-26 DIAGNOSIS — E119 Type 2 diabetes mellitus without complications: Secondary | ICD-10-CM | POA: Diagnosis not present

## 2020-12-03 DIAGNOSIS — E1151 Type 2 diabetes mellitus with diabetic peripheral angiopathy without gangrene: Secondary | ICD-10-CM | POA: Diagnosis not present

## 2020-12-03 DIAGNOSIS — Z7901 Long term (current) use of anticoagulants: Secondary | ICD-10-CM | POA: Diagnosis not present

## 2020-12-03 DIAGNOSIS — F0280 Dementia in other diseases classified elsewhere without behavioral disturbance: Secondary | ICD-10-CM | POA: Diagnosis not present

## 2020-12-03 DIAGNOSIS — E1159 Type 2 diabetes mellitus with other circulatory complications: Secondary | ICD-10-CM | POA: Diagnosis not present

## 2020-12-03 DIAGNOSIS — N183 Chronic kidney disease, stage 3 unspecified: Secondary | ICD-10-CM | POA: Diagnosis not present

## 2020-12-03 DIAGNOSIS — I25118 Atherosclerotic heart disease of native coronary artery with other forms of angina pectoris: Secondary | ICD-10-CM | POA: Diagnosis not present

## 2020-12-03 DIAGNOSIS — E1165 Type 2 diabetes mellitus with hyperglycemia: Secondary | ICD-10-CM | POA: Diagnosis not present

## 2020-12-03 DIAGNOSIS — G309 Alzheimer's disease, unspecified: Secondary | ICD-10-CM | POA: Diagnosis not present

## 2020-12-03 DIAGNOSIS — D692 Other nonthrombocytopenic purpura: Secondary | ICD-10-CM | POA: Diagnosis not present

## 2020-12-03 DIAGNOSIS — I480 Paroxysmal atrial fibrillation: Secondary | ICD-10-CM | POA: Diagnosis not present

## 2020-12-03 DIAGNOSIS — I5022 Chronic systolic (congestive) heart failure: Secondary | ICD-10-CM | POA: Diagnosis not present

## 2020-12-03 DIAGNOSIS — Z515 Encounter for palliative care: Secondary | ICD-10-CM | POA: Diagnosis not present

## 2020-12-09 ENCOUNTER — Other Ambulatory Visit: Payer: Self-pay

## 2020-12-09 ENCOUNTER — Other Ambulatory Visit (INDEPENDENT_AMBULATORY_CARE_PROVIDER_SITE_OTHER): Payer: PPO | Admitting: *Deleted

## 2020-12-09 DIAGNOSIS — Z7901 Long term (current) use of anticoagulants: Secondary | ICD-10-CM | POA: Diagnosis not present

## 2020-12-09 LAB — POCT INR: INR: 3.4 — AB (ref 2.0–3.0)

## 2020-12-13 ENCOUNTER — Telehealth: Payer: Self-pay | Admitting: Family Medicine

## 2020-12-13 NOTE — Telephone Encounter (Signed)
Nurses Recent A1c was 8.7 Patient simply glucose readings many of them in the morning time are in the mid 100s later in the day or in the 200s Please verify is he taking 32 units each evening? If that is the case may increase it to 34 units Please make sure his medication list is updated If it is not 32 units please send me the correct the dose he is take thank you

## 2020-12-14 ENCOUNTER — Other Ambulatory Visit: Payer: Self-pay | Admitting: *Deleted

## 2020-12-14 MED ORDER — LANTUS SOLOSTAR 100 UNIT/ML ~~LOC~~ SOPN
34.0000 [IU] | PEN_INJECTOR | Freq: Every day | SUBCUTANEOUS | 0 refills | Status: DC
Start: 2020-12-14 — End: 2021-01-20

## 2020-12-14 NOTE — Telephone Encounter (Signed)
Called and discussed with pt's niece [name]. She states he is taking 32 units at 2pm. Discussed to increase it to 34 units and med list updated with new dose

## 2020-12-21 ENCOUNTER — Other Ambulatory Visit: Payer: Self-pay | Admitting: Family Medicine

## 2020-12-21 NOTE — Telephone Encounter (Signed)
Last seen for check up 10/21/20

## 2020-12-22 ENCOUNTER — Other Ambulatory Visit: Payer: Self-pay | Admitting: Family Medicine

## 2020-12-23 NOTE — Telephone Encounter (Signed)
Check up 10/21/20

## 2020-12-25 ENCOUNTER — Encounter: Payer: Self-pay | Admitting: Family Medicine

## 2020-12-25 ENCOUNTER — Ambulatory Visit (INDEPENDENT_AMBULATORY_CARE_PROVIDER_SITE_OTHER): Payer: PPO | Admitting: Family Medicine

## 2020-12-25 ENCOUNTER — Other Ambulatory Visit: Payer: Self-pay

## 2020-12-25 VITALS — BP 132/69 | HR 53 | Temp 96.7°F | Wt 205.2 lb

## 2020-12-25 DIAGNOSIS — E785 Hyperlipidemia, unspecified: Secondary | ICD-10-CM | POA: Diagnosis not present

## 2020-12-25 DIAGNOSIS — N183 Chronic kidney disease, stage 3 unspecified: Secondary | ICD-10-CM

## 2020-12-25 DIAGNOSIS — I5023 Acute on chronic systolic (congestive) heart failure: Secondary | ICD-10-CM

## 2020-12-25 DIAGNOSIS — E1122 Type 2 diabetes mellitus with diabetic chronic kidney disease: Secondary | ICD-10-CM | POA: Diagnosis not present

## 2020-12-25 DIAGNOSIS — I4811 Longstanding persistent atrial fibrillation: Secondary | ICD-10-CM | POA: Diagnosis not present

## 2020-12-25 DIAGNOSIS — I5022 Chronic systolic (congestive) heart failure: Secondary | ICD-10-CM | POA: Diagnosis not present

## 2020-12-25 DIAGNOSIS — D692 Other nonthrombocytopenic purpura: Secondary | ICD-10-CM | POA: Diagnosis not present

## 2020-12-25 DIAGNOSIS — R531 Weakness: Secondary | ICD-10-CM

## 2020-12-25 DIAGNOSIS — Z7901 Long term (current) use of anticoagulants: Secondary | ICD-10-CM | POA: Diagnosis not present

## 2020-12-25 DIAGNOSIS — R2681 Unsteadiness on feet: Secondary | ICD-10-CM

## 2020-12-25 DIAGNOSIS — F09 Unspecified mental disorder due to known physiological condition: Secondary | ICD-10-CM | POA: Diagnosis not present

## 2020-12-25 DIAGNOSIS — J8410 Pulmonary fibrosis, unspecified: Secondary | ICD-10-CM

## 2020-12-25 DIAGNOSIS — Z794 Long term (current) use of insulin: Secondary | ICD-10-CM

## 2020-12-25 DIAGNOSIS — I739 Peripheral vascular disease, unspecified: Secondary | ICD-10-CM

## 2020-12-25 DIAGNOSIS — N1832 Chronic kidney disease, stage 3b: Secondary | ICD-10-CM

## 2020-12-25 DIAGNOSIS — R270 Ataxia, unspecified: Secondary | ICD-10-CM

## 2020-12-25 LAB — POCT INR: INR: 1.8 — AB (ref 2.0–3.0)

## 2020-12-25 NOTE — Progress Notes (Signed)
Subjective:    Patient ID: [name], male    DOB: 1932-09-20, 87 y.o.   MRN: [id number]  HPI Pt here for follow up. Pt has not received therapy at this time and family would like therapy. Pt sleeping more than normal.  Results for orders placed or performed in visit on 12/25/20  POCT INR  Result Value Ref Range   INR 1.8 (A) 2.0 - 3.0  Encounter for current long-term use of anticoagulants - Plan: POCT INR  Weakness - Plan: Ambulatory referral to Home Health  Gait instability - Plan: Ambulatory referral to Home Health  Type 2 diabetes mellitus with stage 3b chronic kidney disease, with long-term current use of insulin ([place]) - Plan: Basic metabolic panel, Hemoglobin A1c  Renal insufficiency - Plan: Basic metabolic panel  Hyperlipidemia, unspecified hyperlipidemia type - Plan: Lipid panel  Longstanding persistent atrial fibrillation (HCC)  Chronic systolic heart failure (HCC)  Senile purpura ([place])  Acute on chronic systolic heart failure, NYHA class 3 (HCC)  Peripheral vascular disease, unspecified ([place])  CKD stage 3 due to type 2 diabetes mellitus (HCC)  Cognitive dysfunction     Review of Systems  Constitutional: Negative for activity change.  HENT: Negative for congestion and rhinorrhea.   Respiratory: Negative for cough and shortness of breath.   Cardiovascular: Negative for chest pain.  Gastrointestinal: Negative for abdominal pain, diarrhea, nausea and vomiting.  Genitourinary: Negative for dysuria and hematuria.  Neurological: Negative for weakness and headaches.  Psychiatric/Behavioral: Negative for behavioral problems and confusion.       Objective:   Physical Exam Vitals reviewed.  Constitutional:      Appearance: He is well-nourished.  Cardiovascular:     Rate and Rhythm: Normal rate.     Heart sounds: Normal heart sounds. No murmur heard.   Pulmonary:     Effort: Pulmonary effort is normal.     Breath sounds: Normal breath sounds.   Musculoskeletal:        General: No edema.  Lymphadenopathy:     Cervical: No cervical adenopathy.  Neurological:     Mental Status: He is alert.  Psychiatric:        Behavior: Behavior normal.    Ulcers in the feet Venous stasis changes Does have significant underlying heart related issues but stable currently Diabetes sugars in the morning time look good but later in the day are elevated Family tries to monitor closely his insulin        Assessment & Plan:  .1. Encounter for current long-term use of anticoagulants INR we will adjust the Coumadin and recheck this again in 4 weeks time currently 1.8 - POCT INR  2. Pulmonary fibrosis ([place]) Does get out of breath with activity.  But no worse compared to previous keep everything as is  3. Gait instability He has severe weakness in his legs with difficulty walking uses a walker and needs to have home health working with him along with physical therapy - Ambulatory referral to [place]  4. CKD stage 3 due to type 2 diabetes mellitus ([place]) Underlying kidney disease monitor creatinine periodically watch diet closely watch salt  5. Type 2 diabetes mellitus with stage 3b chronic kidney disease, with long-term current use of insulin (HCC) Continue insulin avoid low sugar spells avoid excessively high it is reasonable to accept A1c in the 7 or 8 range - Basic metabolic panel - Hemoglobin A1c  6. Hyperlipidemia, unspecified hyperlipidemia type Continue current medication watch diet - Lipid panel  7. Longstanding persistent atrial fibrillation (HCC) Continue Coumadin watch diet  8. Chronic systolic heart failure (HCC) Avoid excessive salt blood pressure under good control lungs sound good  9. Senile purpura (HCC) Monitor  10. Acute on chronic systolic heart failure, NYHA class 3 (HCC) Continue current medications.  No need to add diuretics on top of what is going on  11. Peripheral vascular disease, unspecified  ([place]) Patient has peripheral vascular disease related to cardiovascular as well as diabetes stable currently no ulcers noted  12. Cognitive dysfunction Patient is not allowed to drive family is to help take care of him  63. Weakness He would benefit from physical therapy Monthly INR Follow-up every 3 months with labs and office visit - Ambulatory referral to [place]

## 2020-12-28 NOTE — Addendum Note (Signed)
Addended by: Vicente Males on: 12/28/2020 09:01 AM   Modules accepted: Orders

## 2020-12-28 NOTE — Progress Notes (Signed)
12/29/19- PT and Home Health referral placed(both placed just in case)

## 2021-01-02 ENCOUNTER — Other Ambulatory Visit: Payer: Self-pay | Admitting: Family Medicine

## 2021-01-04 DIAGNOSIS — E119 Type 2 diabetes mellitus without complications: Secondary | ICD-10-CM | POA: Diagnosis not present

## 2021-01-05 ENCOUNTER — Telehealth: Payer: Self-pay

## 2021-01-05 ENCOUNTER — Other Ambulatory Visit: Payer: Self-pay | Admitting: Family Medicine

## 2021-01-05 NOTE — Telephone Encounter (Signed)
Prescription sent to pharmacy by Dr Nicki Reaper: [name] [name]) notified that referral coordinator is still working on referral and do to shortages it has taken time to find agency accepting new patients.

## 2021-01-05 NOTE — Telephone Encounter (Signed)
May have refill of Xanax with 2 additional refills please pend  Please inform [name] that due to home health shortages we are running into some difficulty getting this set up but we are hopeful that we can find somebody but due to staffing shortages with the home health agency currently right now having a difficult time setting this up.

## 2021-01-05 NOTE — Telephone Encounter (Signed)
Needs refill on ALPRAZolam (XANAX) 0.25 MG tablet [place], [place] [facility] [place]   Pt also has not heard anything on therapy that Dr Nicki Reaper was going to set him up with and Dr Nicki Reaper told them to call back if nothing I two weeks.  [name] call back [phone]

## 2021-01-07 ENCOUNTER — Telehealth: Payer: Self-pay | Admitting: *Deleted

## 2021-01-07 DIAGNOSIS — E1140 Type 2 diabetes mellitus with diabetic neuropathy, unspecified: Secondary | ICD-10-CM | POA: Diagnosis not present

## 2021-01-07 DIAGNOSIS — Z7984 Long term (current) use of oral hypoglycemic drugs: Secondary | ICD-10-CM | POA: Diagnosis not present

## 2021-01-07 DIAGNOSIS — E1122 Type 2 diabetes mellitus with diabetic chronic kidney disease: Secondary | ICD-10-CM | POA: Diagnosis not present

## 2021-01-07 DIAGNOSIS — I4811 Longstanding persistent atrial fibrillation: Secondary | ICD-10-CM | POA: Diagnosis not present

## 2021-01-07 DIAGNOSIS — N1832 Chronic kidney disease, stage 3b: Secondary | ICD-10-CM | POA: Diagnosis not present

## 2021-01-07 DIAGNOSIS — Z794 Long term (current) use of insulin: Secondary | ICD-10-CM | POA: Diagnosis not present

## 2021-01-07 DIAGNOSIS — E1151 Type 2 diabetes mellitus with diabetic peripheral angiopathy without gangrene: Secondary | ICD-10-CM | POA: Diagnosis not present

## 2021-01-07 DIAGNOSIS — J8410 Pulmonary fibrosis, unspecified: Secondary | ICD-10-CM | POA: Diagnosis not present

## 2021-01-07 DIAGNOSIS — I878 Other specified disorders of veins: Secondary | ICD-10-CM | POA: Diagnosis not present

## 2021-01-07 DIAGNOSIS — Z7901 Long term (current) use of anticoagulants: Secondary | ICD-10-CM | POA: Diagnosis not present

## 2021-01-07 DIAGNOSIS — I130 Hypertensive heart and chronic kidney disease with heart failure and stage 1 through stage 4 chronic kidney disease, or unspecified chronic kidney disease: Secondary | ICD-10-CM | POA: Diagnosis not present

## 2021-01-07 DIAGNOSIS — Z9181 History of falling: Secondary | ICD-10-CM | POA: Diagnosis not present

## 2021-01-07 DIAGNOSIS — D692 Other nonthrombocytopenic purpura: Secondary | ICD-10-CM | POA: Diagnosis not present

## 2021-01-07 DIAGNOSIS — I5023 Acute on chronic systolic (congestive) heart failure: Secondary | ICD-10-CM | POA: Diagnosis not present

## 2021-01-07 DIAGNOSIS — E785 Hyperlipidemia, unspecified: Secondary | ICD-10-CM | POA: Diagnosis not present

## 2021-01-07 DIAGNOSIS — Z7982 Long term (current) use of aspirin: Secondary | ICD-10-CM | POA: Diagnosis not present

## 2021-01-07 DIAGNOSIS — K5790 Diverticulosis of intestine, part unspecified, without perforation or abscess without bleeding: Secondary | ICD-10-CM | POA: Diagnosis not present

## 2021-01-07 NOTE — Telephone Encounter (Addendum)
[name] [name] pt with [facility] calling to get orders for 1 for 1 week and 2 week 4. Also was reconciling meds and he did not have any  Potassium or norco in the home that was on med list and he did have lantus at home that was not on med list. does take lantus solostar 34 units evening.   [employer] [phone]

## 2021-01-07 NOTE — Progress Notes (Signed)
Advanced Heart Failure Clinic Note   Referring Physician: PCP: Kathyrn Drown, MD PCP-Cardiologist: Sherren Mocha, MD  AHF: Dr. Haroldine Laws   HPI:   87 y/o male with DM2, HTN, CKD, CHF with prior EF 40%, CAD, AS s/p TAVR in 2017 and dementia  R/LHC on 05/11/20 demonstrated elevated filling pressures c/w volume overload, low output HF and significant 2v CAD. CI 1.6 by Fick calculation. 1.3 by Thermodilution. PCWP 27 mmHg. Angiography showed  80-90% proximal LAD stenosis involving ostium of D1 (70% D1 stenosis), and sequential 50% ostial and proximal LCx lesions followed by 80% mid/distal LCx stenosis (similar in appearance to 2017). Moderate, diffuse RCA disease similar to 2017.  He was directly admitted from the cath lab and started on milrinone,  IV Lasix and IV heparin. Echo was repeated and demonstrated LVEF 35-40% (lower than prior study), RV ok, moderate MR, prosthetic AV ok. He diuresed well and hemodynamics improved on milrinone. Transitioned off milrinone and placed back on PO diuretics. EP evaluated for potential CRT pacing but he was deemed not a candidate given QRS duration, advanced age and AFib. Case also discussed with interventionalist and it was decided not to intervene on the LAD given lack of CP.   PT/OT evaluated and recommended SNF. He was discharged to SNF on 7/1. Went to [employer] [facility].  Here for f/u with his family. Has been very weak. Lost 20 pounds. Has had a couple falls. Uses a walker. Denies CP or SOB. BP stable. No    R/LHC 05/11/20 Conclusions: 1. Severe two-vessel coronary artery disease, including 80-90% proximal LAD stenosis involving ostium of D1 (70% D1 stenosis), and sequential 50% ostial and proximal LCx lesions followed by 80% mid/distal LCx stenosis (similar in appearance to 2017). 2. Moderate, diffuse RCA disease similar to 2017. 3. Moderately elevated left heart, right heart, and pulmonary artery pressures. 4. Prominent V-waves on PCWP tracing; query  more severe mitral regurgitation than reported on echocardiogram in 02/2020. 5. Severely reduced cardiac output/index.  Right Heart Pressures RA (mean): 13 mmHg RV (S/EDP): 60/12 mmHg PA (S/D, mean): 60/30 (40) mmHg PCWP (mean): 27 mmHg with prominent V-waves  Ao sat: 98% PA sat: 55%  Fick CO: 3.4 L/min Fick CI: 1.6 L/min/m^2  Thermodilution CO: 2.8 L/min Thermodilution CI: 1.3 L/min/m^2    2D Echo 04/2020 Left ventricular ejection fraction, by estimation, is 35 to 40%. The left ventricle has moderately decreased function. The left ventricle demonstrates global hypokinesis. Left ventricular diastolic function could not be evaluated. 2. Right ventricular systolic function is normal. The right ventricular size is normal. There is mildly elevated pulmonary artery systolic pressure. 3. Left atrial size was severely dilated. 4. Right atrial size was moderately dilated. 5. The mitral valve is normal in structure. Moderate mitral valve regurgitation. 6. Tricuspid valve regurgitation is moderate. 7. The aortic valve has been repaired/replaced. Aortic valve regurgitation is not visualized. There is a 29 mm Edwards Sapien prosthetic (TAVR) valve present in the aortic position. Echo findings are consistent with normal structure and function of the aortic valve prosthesis. 8. The inferior vena cava is normal in size with greater than 50% respiratory variability, suggesting right atrial pressure of 3 mmHg.      Past Medical History:  Diagnosis Date  . Acute on chronic systolic CHF (congestive heart failure) ([place]) 02/09/2017  . Acute respiratory failure with hypoxia ([place])   . AKI (acute kidney injury) ([place]) 08/20/2015  . Altered mental status 02/21/2017  . Aortic stenosis    a. mod-sev  by echo 05/2016.  . Asthma   . Asthma exacerbation 08/20/2015  . Atrial fibrillation (HCC)    Paroxysmal; Echo in 2007-normal EF; mild LVH; LA enlargement; mild AS, minimal AI; neg. stress nuclear study  in 2008   . BPH (benign prostatic hypertrophy) with urinary obstruction 09/27/2011   Transurethral resection of the prostate in 09/2011   . CAD (coronary artery disease), native coronary artery 06/28/2016  . Cardiogenic shock ([place])   . Carotid stenosis 04/25/2012   Carotid US 9/21: Bilateral ICA 1-39  . Cerebrovascular disease 07/2010   TIA; carotid ultrasound in 07/2010-significant bilateral plaque without focal internal carotid artery stenosis; MRI -encephalomalacia left temporal and right temporal lobes; small inferior right cerebellar infarct; small vessel disease  . Chronic atrial fibrillation (HCC)    Paroxysmal; Echocardiogram in 2007-normal EF; mild LVH; left atrial enlargement; mild stenosis and minimal AI; negative stress nuclear study in 2008  . Chronic systolic CHF (congestive heart failure) ([place])    a. dx 05/2016 - EF 35-40%, diffuse HK, mod-severe AS, mod gradient, severe AVA VTI likely due to decreased cardiac output in setting of systolic dysfsunction and significant mitral regurgitation, mild MR, mod-severe MR, severe LAE, mild-mod RV dilation, mild RAE, mild-mod TR, mod PASP 55mmHg.  . CKD (chronic kidney disease), stage II   . Cognitive dysfunction 05/29/2015   I believe that this is related to his age as well as previous stroke   . Congestive heart failure (CHF) ([place]) 02/09/2017  . Degenerative joint disease    feet and legs  . Diabetes mellitus    no insulin; A1c of 6.6 in 2005  . Dizziness    occurs daily,especially in am  . Elevated troponin   . Exertional dyspnea   . Gastroesophageal reflux disease   . Hepatic steatosis   . History of noncompliance with medical treatment   . Hyperlipidemia    adverse reactions to statins and niacin  . Hypertension    Borderline  . Irregular heartbeat   . Mitral regurgitation    a. mod-sev by echo 05/2016  . Peripheral vascular disease ([place])   . Renal insufficiency   . S/P TAVR (transcatheter aortic valve replacement) 07/19/2016    29 mm Edwards Sapien 3 transcatheter heart valve placed via left percutaneous transfemoral approach  . Senile purpura ([place]) 08/15/2018  . Temporal arteritis ([place])   . Tricuspid regurgitation    a. mild-mod TR by echo 05/2016  . VT (ventricular tachycardia) (HCC)     Current Outpatient Medications  Medication Sig Dispense Refill  . ALPRAZolam (XANAX) 0.25 MG tablet TAKE (1) TABLET BY MOUTH THREE TIMES DAILY AS NEEDED FOR ANXIETY. 30 tablet 2  . aspirin EC 81 MG EC tablet Take 1 tablet (81 mg total) by mouth daily. Swallow whole. 30 tablet 11  . bisoprolol (ZEBETA) 5 MG tablet TAKE 0.5 TABLET (2.5MG  TOTAL) BY MOUTH ONCE DAILY. 14 tablet 2  . cholecalciferol (VITAMIN D3) 25 MCG (1000 UNIT) tablet Take 1,000 Units by mouth in the morning and at bedtime.    . digoxin (LANOXIN) 0.125 MG tablet Take 0.5 tablets (0.0625 mg total) by mouth daily. 45 tablet 3  . furosemide (LASIX) 20 MG tablet TAKE (1) TABLET BY MOUTH ONCE DAILY. 28 tablet 11  . HYDROcodone-acetaminophen (NORCO) 5-325 MG tablet Take 1 tablet by mouth every 6 (six) hours as needed for severe pain. 10 tablet 0  . insulin glargine (LANTUS SOLOSTAR) 100 UNIT/ML Solostar Pen Inject 34 Units into the skin daily. 15 mL  0  . isosorbide mononitrate (IMDUR) 30 MG 24 hr tablet Take 15 mg by mouth daily.    . metFORMIN (GLUCOPHAGE) 500 MG tablet Take 1/2 tablet po daily 15 tablet 6  . Methylcobalamin (B12-ACTIVE PO) Take by mouth.    . nortriptyline (PAMELOR) 10 MG capsule TAKE 1 CAPSULES AT BEDTIME FOR BURNING IN FEET.[28 IN PACKS/ NONE IN BOTTLE] 28 capsule 11  . OLANZapine (ZYPREXA) 2.5 MG tablet TAKE 1 TABLET TWICE DAILY AS DIRECTED. 56 tablet 11  . potassium chloride SA (KLOR-CON) 20 MEQ tablet TAKE 1 TABLET BY MOUTH ON MONDAY, WEDNESDAY AND FRIDAY. 12 tablet 4  . psyllium (METAMUCIL) 58.6 % powder Take 1 packet by mouth daily.    . rosuvastatin (CRESTOR) 5 MG tablet TAKE (1) TABLET BY MOUTH ONCE DAILY AT BEDTIME. 28 tablet 4  .  spironolactone (ALDACTONE) 25 MG tablet Take 0.5 tablets (12.5 mg total) by mouth daily. 15 tablet 11  . TRUEPLUS PEN NEEDLES 31G X 8 MM MISC USE WITH LANTUS SOLOSTAR EVERY EVENING 30 each 11  . warfarin (COUMADIN) 6 MG tablet TAKE 1 TABLET BY MOUTH EVERY MONDAY,WEDNESDAY AND SATURDAY AND 1/2 TABLET ON SUNDAY,TUESDAY,THURSDAY AND FRIDAY. 19 tablet 11   No current facility-administered medications for this encounter.    Allergies  Allergen Reactions  . Ambien [Zolpidem Tartrate] Other (See Comments)    Sleep walks  . Lipitor [Atorvastatin Calcium] Other (See Comments)    myalgias  . Ranitidine Other (See Comments)    Chest discomfort  . Simvastatin Other (See Comments)    Myalgias  . Xanax Xr [Alprazolam Er] Other (See Comments)    Tightness in chest  . Cholestatin Other (See Comments)    UNSPECIFIED REACTION   . Clopidogrel Bisulfate Rash      Social History   Socioeconomic History  . Marital status: Widowed    Spouse name: Not on file  . Number of children: 1  . Years of education: Not on file  . Highest education level: Not on file  Occupational History  . Occupation: Retired  Tobacco Use  . Smoking status: Former Smoker    Packs/day: 1.00    Years: 20.00    Pack years: 20.00    Types: Cigarettes    Start date: 07/04/1950    Quit date: 04/25/1992    Years since quitting: 28.7  . Smokeless tobacco: Current User    Types: Chew  Substance and Sexual Activity  . Alcohol use: No    Alcohol/week: 0.0 standard drinks  . Drug use: No  . Sexual activity: Not Currently  Other Topics Concern  . Not on file  Social History Narrative  . Not on file   Social Determinants of Health   Financial Resource Strain: Not on file  Food Insecurity: Not on file  Transportation Needs: Not on file  Physical Activity: Not on file  Stress: Not on file  Social Connections: Not on file  Intimate Partner Violence: Not on file      Family History  Problem Relation Age of Onset  .  Stroke Mother   . Diabetes Father   . Colon cancer Neg Hx     Vitals:   01/08/21 1125  BP: 118/70  Pulse: 77  SpO2: 98%  Weight: 91.8 kg (202 lb 6.4 oz)   Wt Readings from Last 3 Encounters:  01/08/21 91.8 kg (202 lb 6.4 oz)  12/25/20 93.1 kg (205 lb 3.2 oz)  09/23/20 94.1 kg (207 lb 6.4 oz)  PHYSICAL EXAM: General:  Elderly. Arrived in wheel chair. No resp difficulty HEENT: normal Neck: supple. no JVD. Carotids 2+ bilat; no bruits. No lymphadenopathy or thryomegaly appreciated. Cor: PMI nondisplaced. Irregular rate & rhythm. No rubs, gallops or murmurs. Lungs: clear Abdomen: soft, nontender, nondistended. No hepatosplenomegaly. No bruits or masses. Good bowel sounds. Extremities: no cyanosis, clubbing, rash, edema Neuro: alert & orientedx3, cranial nerves grossly intact. moves all 4 extremities w/o difficulty. Affect pleasant    ASSESSMENT & PLAN:  1. Chronic Systolic Heart Failure  - Echo 4/21 LVEF 40%. RV ok. Mild MR - Echo 5/21 LVEF 35-40%. RV ok, moderate MR - Echo 6/21 LVEF 35-40%, RV ok, moderate MR, prosthetic AV ok  - Recent admission 6/21 for ADHF. RHC demonstrated elevated filling pressures and low CO/CI. PCWP 27 mmHg. RA pressure 13 mmHg. CI 1.6 by FICK, 1.3 by Thermo. Required milrinone to help w/ diuresis.  -NYHA  III. Functional capacity mainly limited by age and physical debility. About to start Rehab Tuesday - Volume status stable to a bit a low. Change lasix to 20mg  MWF + as needed (not daily).  - Continue bisoprolol 2.5 mg daily.  - Continue 0.125 mg digoxin - Continue spironolactone 12.5 mg qhs - Continue Imdur 15 mg daily  - BP has been too soft for ARNi/ARB.   - not a candidate for CTR-P per EP  - check labs today - F/u 6 months with echo  2. CAD - LHC in 2017 showed moderate nonobstructive disease - [facility] 6/21 w/ progressive disease: severe 2VD w/80-90% proximal LAD stenosis involving ostium of D1 (70% D1 stenosis), and sequential 50%  ostial and proximal LCx lesions followed by 80% mid/distal LCx stenosis (similar in appearance to 2017). Moderate, diffuse RCA disease similar to 2017. Medical management elected given lack of CP.  - No s/s angina - Can stop ASA with warfarin - Continue statin, Crestor 5 mg daily  - continue bisoprolol 2.5 mg daily   3. Mitral Regurgitation - Echo 02/2020 showed only mild MR - Echo 6/21>> moderate, likely functional    4. H/o Aortic Stenosis w/ TAVR - s/p TAVR 07/2016 - stable prosthesis on recent echo 6/21  5. Carotid Artery Disease - s/p Rt CEA 2017  - last carotid dopplers 2018 showed patent RICA and mod (50-69%) Lt ICA stenosis - asymptomatic - continue medical therapy w/statin. BP is well controlled.   6. Chronic Afib - rate controlled Continue bisoprolol 2.5 mg daily.   - Continue Coumadin. INRs followed by PCP.   7. T2DM - poorly controlled. Hgb A1c 3/21 was 9.8  - on insulin - managed by PCP  8. Stage III CKD - baseline SCr ~1.3-1.5 - labs today  9. Debility and Dementia - At home w/ 24 hr home health nursing care + PT    [name], MD 01/07/21

## 2021-01-08 ENCOUNTER — Other Ambulatory Visit: Payer: Self-pay

## 2021-01-08 ENCOUNTER — Ambulatory Visit (HOSPITAL_COMMUNITY)
Admission: RE | Admit: 2021-01-08 | Discharge: 2021-01-08 | Disposition: A | Discharge status: Home / Self Care | Payer: PPO | Source: Ambulatory Visit | Attending: Internal Medicine | Admitting: Internal Medicine

## 2021-01-08 ENCOUNTER — Other Ambulatory Visit: Payer: Self-pay | Admitting: Family Medicine

## 2021-01-08 ENCOUNTER — Encounter (HOSPITAL_COMMUNITY): Payer: Self-pay | Admitting: Internal Medicine

## 2021-01-08 VITALS — BP 118/70 | HR 77 | Wt 202.4 lb

## 2021-01-08 DIAGNOSIS — Z952 Presence of prosthetic heart valve: Secondary | ICD-10-CM | POA: Insufficient documentation

## 2021-01-08 DIAGNOSIS — I4821 Permanent atrial fibrillation: Secondary | ICD-10-CM | POA: Diagnosis not present

## 2021-01-08 DIAGNOSIS — Z794 Long term (current) use of insulin: Secondary | ICD-10-CM | POA: Insufficient documentation

## 2021-01-08 DIAGNOSIS — I482 Chronic atrial fibrillation, unspecified: Secondary | ICD-10-CM | POA: Insufficient documentation

## 2021-01-08 DIAGNOSIS — Z8673 Personal history of transient ischemic attack (TIA), and cerebral infarction without residual deficits: Secondary | ICD-10-CM | POA: Insufficient documentation

## 2021-01-08 DIAGNOSIS — I5022 Chronic systolic (congestive) heart failure: Secondary | ICD-10-CM | POA: Diagnosis not present

## 2021-01-08 DIAGNOSIS — Z7982 Long term (current) use of aspirin: Secondary | ICD-10-CM | POA: Insufficient documentation

## 2021-01-08 DIAGNOSIS — I2510 Atherosclerotic heart disease of native coronary artery without angina pectoris: Secondary | ICD-10-CM

## 2021-01-08 DIAGNOSIS — I130 Hypertensive heart and chronic kidney disease with heart failure and stage 1 through stage 4 chronic kidney disease, or unspecified chronic kidney disease: Secondary | ICD-10-CM | POA: Insufficient documentation

## 2021-01-08 DIAGNOSIS — I4891 Unspecified atrial fibrillation: Secondary | ICD-10-CM | POA: Diagnosis not present

## 2021-01-08 DIAGNOSIS — Z87891 Personal history of nicotine dependence: Secondary | ICD-10-CM | POA: Insufficient documentation

## 2021-01-08 DIAGNOSIS — E1122 Type 2 diabetes mellitus with diabetic chronic kidney disease: Secondary | ICD-10-CM | POA: Insufficient documentation

## 2021-01-08 DIAGNOSIS — F0390 Unspecified dementia without behavioral disturbance: Secondary | ICD-10-CM | POA: Insufficient documentation

## 2021-01-08 DIAGNOSIS — J45909 Unspecified asthma, uncomplicated: Secondary | ICD-10-CM | POA: Diagnosis not present

## 2021-01-08 DIAGNOSIS — Z7901 Long term (current) use of anticoagulants: Secondary | ICD-10-CM | POA: Diagnosis not present

## 2021-01-08 DIAGNOSIS — Z79899 Other long term (current) drug therapy: Secondary | ICD-10-CM | POA: Diagnosis not present

## 2021-01-08 DIAGNOSIS — E785 Hyperlipidemia, unspecified: Secondary | ICD-10-CM | POA: Insufficient documentation

## 2021-01-08 DIAGNOSIS — N183 Chronic kidney disease, stage 3 unspecified: Secondary | ICD-10-CM | POA: Diagnosis not present

## 2021-01-08 DIAGNOSIS — I081 Rheumatic disorders of both mitral and tricuspid valves: Secondary | ICD-10-CM | POA: Diagnosis not present

## 2021-01-08 LAB — CBC
HCT: 46 % (ref 39.0–52.0)
Hemoglobin: 15.1 g/dL (ref 13.0–17.0)
MCH: 30 pg (ref 26.0–34.0)
MCHC: 32.8 g/dL (ref 30.0–36.0)
MCV: 91.3 fL (ref 80.0–100.0)
Platelets: 244 10*3/uL (ref 150–400)
RBC: 5.04 MIL/uL (ref 4.22–5.81)
RDW: 13.2 % (ref 11.5–15.5)
WBC: 8.1 10*3/uL (ref 4.0–10.5)
nRBC: 0 % (ref 0.0–0.2)

## 2021-01-08 LAB — BASIC METABOLIC PANEL
Anion gap: 11 (ref 5–15)
BUN: 17 mg/dL (ref 8–23)
CO2: 27 mmol/L (ref 22–32)
Calcium: 9.5 mg/dL (ref 8.9–10.3)
Chloride: 96 mmol/L — ABNORMAL LOW (ref 98–111)
Creatinine, Ser: 1.19 mg/dL (ref 0.61–1.24)
GFR, Estimated: 59 mL/min — ABNORMAL LOW (ref >60)
Glucose, Bld: 291 mg/dL — ABNORMAL HIGH (ref 70–99)
Potassium: 4.4 mmol/L (ref 3.5–5.1)
Sodium: 134 mmol/L — ABNORMAL LOW (ref 135–145)

## 2021-01-08 LAB — BRAIN NATRIURETIC PEPTIDE: B Natriuretic Peptide: 69.7 pg/mL (ref 0.0–100.0)

## 2021-01-08 LAB — DIGOXIN LEVEL: Digoxin Level: 0.6 ng/mL — ABNORMAL LOW (ref 0.8–2.0)

## 2021-01-08 MED ORDER — POTASSIUM CHLORIDE CRYS ER 20 MEQ PO TBCR
EXTENDED_RELEASE_TABLET | ORAL | 4 refills | Status: DC
Start: 2021-01-08 — End: 2021-01-08

## 2021-01-08 MED ORDER — FUROSEMIDE 20 MG PO TABS
20.0000 mg | ORAL_TABLET | ORAL | 11 refills | Status: DC
Start: 2021-01-08 — End: 2021-08-21

## 2021-01-08 NOTE — Telephone Encounter (Signed)
Ben contacted. Reordered Potassium. D/C hydrocodone from med list. Suezanne Jacquet states that pt has Xanax on med list but none in home and Suezanne Jacquet was going over allergies and noticed that patient has had an intolerance to Xanax in the past (chest tightness). Ben needing to know how to proceed forward with the Xanax. Please advise. Thank you

## 2021-01-08 NOTE — Addendum Note (Signed)
Encounter addended by: Scarlette Calico, RN on: 01/08/2021 12:00 PM  Actions taken: Pharmacy for encounter modified, Diagnosis association updated, Order list changed, Clinical Note Signed, Charge Capture section accepted

## 2021-01-08 NOTE — Telephone Encounter (Signed)
1.-He has taken plain Xanax before without trouble 2.  At 1 time a specialist put him on Xanax extended release and he had side effects therefore it ended up on the epic as a " allergy" but it is not an allergy.  I would stay away from Xanax extended release. #3 I truly do not like using these type of medicines with BA he is elderly and becoming frail with frequent falls.  I do not feel he needs to have any of these in his home.  From time to time we may prescribe a limited number of low-dose Xanax.  For now we will leave this on the list but this is not intended to be a regular medicine for this patient

## 2021-01-08 NOTE — Telephone Encounter (Signed)
Potassium refills were sent into the pharmacy in December, may resend these again 4 months worth  Hydrocodone lets take that off his med list Thank you

## 2021-01-08 NOTE — Telephone Encounter (Signed)
Ben contacted and verbalized understanding.  °

## 2021-01-08 NOTE — Patient Instructions (Signed)
Stop Aspirin  Decrease Furosemide to 20 mg every Monday, Wednesday, and Friday, can take on other days AS NEEDED  Labs done today, we will call you for abnormal results  If you have any questions or concerns before your next appointment please send [place] a message through [place] or call our office at [phone].    TO LEAVE A MESSAGE FOR THE NURSE SELECT OPTION 2, PLEASE LEAVE A MESSAGE INCLUDING: . YOUR NAME . DATE OF BIRTH . CALL BACK NUMBER . REASON FOR CALL**this is important as we prioritize the call backs  [place] AS LONG AS YOU CALL BEFORE 4:00 PM  At the [facility], you and your health needs are our priority. As part of our continuing mission to provide you with exceptional heart care, we have created designated Provider Care Teams. These Care Teams include your primary Cardiologist (physician) and Advanced Practice Providers (APPs- Physician Assistants and Nurse Practitioners) who all work together to provide you with the care you need, when you need it.   You may see any of the following providers on your designated Care Team at your next follow up: [name] Dr [name] . Dr Loralie Champagne . Dr Vickki Muff . Darrick Grinder, NP . Lyda Jester, [place] . Audry Riles, PharmD   Please be sure to bring in all your medications bottles to every appointment.

## 2021-01-11 ENCOUNTER — Ambulatory Visit: Payer: PPO | Admitting: Cardiovascular Disease

## 2021-01-11 ENCOUNTER — Other Ambulatory Visit: Payer: Self-pay

## 2021-01-11 ENCOUNTER — Encounter: Payer: Self-pay | Admitting: Cardiovascular Disease

## 2021-01-11 VITALS — BP 100/60 | HR 76 | Ht 73.0 in | Wt 201.6 lb

## 2021-01-11 DIAGNOSIS — I4821 Permanent atrial fibrillation: Secondary | ICD-10-CM | POA: Diagnosis not present

## 2021-01-11 DIAGNOSIS — I340 Nonrheumatic mitral (valve) insufficiency: Secondary | ICD-10-CM

## 2021-01-11 DIAGNOSIS — Z952 Presence of prosthetic heart valve: Secondary | ICD-10-CM | POA: Diagnosis not present

## 2021-01-11 DIAGNOSIS — I5022 Chronic systolic (congestive) heart failure: Secondary | ICD-10-CM

## 2021-01-11 NOTE — Progress Notes (Signed)
Cardiology Office Note:    Date:  01/11/2021   ID:  [name] [name], DOB [birth year], MRN [id number]  PCP:  Kathyrn Drown, MD   [facility]  Cardiologist:  Sherren Mocha, MD . Advanced Practice Provider:  No care team member to display Electrophysiologist:  None       Referring MD: Kathyrn Drown, MD   Chief Complaint  Patient presents with   S/P TAVR    History of Present Illness:    [name] [name] is a 87 y.o. male with a hx of:  Aortic stenosis ? S/p TAVR 03/2016  Coronary artery disease ? Moderate nonobstructive disease by cardiac catheterization in 2017 ? Cath 6/21: pLAD 85; D1 70; dLCx 80 >> Med Rx   Carotid artery disease ? S/p R carotid stent 03/2016 ? [place] 12/18:  Patent R ICA stent; LICA 01-75  Permanent atrial fibrillation ? Warfarin (managed by PCP)  Chronic systolic CHF ? Nonischemic cardiomyopathy ? Echo 7/17: EF 35 (severe low gradientASwith low EF) ? Echo 3/18: EF 40 ? Echocardiogram 6/21: EF 35-40, BAE, RVSP 38  Mitral regurgitation  ? Echocardiogram 6/21: mod MR (likely functional MR)   Hypertension  Diabetes mellitus  Chronic kidney disease   Hyperlipidemia  The patient is here with his family today.  He is brought in a wheelchair.  He does not add much to the history and has developed progressive problems related to dementia.  He denies chest pain or shortness of breath.  He has had no recent leg swelling.  He has become much less active than in the past.  The patient just saw Dr. Haroldine Laws last week and is felt to be stable.  His furosemide was reduced to Monday Wednesday Friday dosing program.  No other changes were made.  He plans to see him back in 6 months with an echocardiogram.  The patient is here with his family today. He is on a walker. He is about to start home physical therapy.   Past Medical History:  Diagnosis Date   Acute on chronic systolic CHF (congestive heart failure) ([place]) 02/09/2017    Acute respiratory failure with hypoxia (HCC)    AKI (acute kidney injury) ([place]) 08/20/2015   Altered mental status 02/21/2017   Aortic stenosis    a. mod-sev by echo 05/2016.   Asthma    Asthma exacerbation 08/20/2015   Atrial fibrillation (HCC)    Paroxysmal; Echo in 2007-normal EF; mild LVH; LA enlargement; mild AS, minimal AI; neg. stress nuclear study in 2008    BPH (benign prostatic hypertrophy) with urinary obstruction 09/27/2011   Transurethral resection of the prostate in 09/2011    CAD (coronary artery disease), native coronary artery 06/28/2016   Cardiogenic shock ([place])    Carotid stenosis 04/25/2012   Carotid US 9/21: Bilateral ICA 1-39   Cerebrovascular disease 07/2010   TIA; carotid ultrasound in 07/2010-significant bilateral plaque without focal internal carotid artery stenosis; MRI -encephalomalacia left temporal and right temporal lobes; small inferior right cerebellar infarct; small vessel disease   Chronic atrial fibrillation (HCC)    Paroxysmal; Echocardiogram in 2007-normal EF; mild LVH; left atrial enlargement; mild stenosis and minimal AI; negative stress nuclear study in 1025   Chronic systolic CHF (congestive heart failure) ([place])    a. dx 05/2016 - EF 35-40%, diffuse HK, mod-severe AS, mod gradient, severe AVA VTI likely due to decreased cardiac output in setting of systolic dysfsunction and significant mitral regurgitation, mild MR, mod-severe MR, severe  LAE, mild-mod RV dilation, mild RAE, mild-mod TR, mod PASP 80mmHg.   CKD (chronic kidney disease), stage II    Cognitive dysfunction 05/29/2015   I believe that this is related to his age as well as previous stroke    Congestive heart failure (CHF) ([place]) 02/09/2017   Degenerative joint disease    feet and legs   Diabetes mellitus    no insulin; A1c of 6.6 in 2005   Dizziness    occurs daily,especially in am   Elevated troponin    Exertional dyspnea    Gastroesophageal reflux disease    Hepatic  steatosis    History of noncompliance with medical treatment    Hyperlipidemia    adverse reactions to statins and niacin   Hypertension    Borderline   Irregular heartbeat    Mitral regurgitation    a. mod-sev by echo 05/2016   Peripheral vascular disease ([place])    Renal insufficiency    S/P TAVR (transcatheter aortic valve replacement) 07/19/2016   29 mm Edwards Sapien 3 transcatheter heart valve placed via left percutaneous transfemoral approach   Senile purpura ([place]) 08/15/2018   Temporal arteritis (HCC)    Tricuspid regurgitation    a. mild-mod TR by echo 05/2016   VT (ventricular tachycardia) ([place])     Past Surgical History:  Procedure Laterality Date   COLONOSCOPY  2002   COLONOSCOPY  01/19/2012   Procedure: COLONOSCOPY;  Surgeon: Rogene Houston, MD;  [facility];  Service: Endoscopy;  Laterality: N/A;  Foley   Right   PERIPHERAL VASCULAR CATHETERIZATION N/A 07/11/2016   Procedure: Carotid PTA/Stent Intervention;  Surgeon: Lorretta Harp, MD;  Location: [facility];  Service: Cardiovascular;  Laterality: N/A;   PROSTATE SURGERY  12/2011   RIGHT HEART CATH AND CORONARY ANGIOGRAPHY N/A 05/11/2020   Procedure: RIGHT HEART CATH AND CORONARY ANGIOGRAPHY;  Surgeon: Nelva Bush, MD;  Location: [facility];  Service: Cardiovascular;  Laterality: N/A;   ROTATOR CUFF REPAIR     Right   TEE WITHOUT CARDIOVERSION N/A 06/17/2016   Procedure: TRANSESOPHAGEAL ECHOCARDIOGRAM (TEE);  Surgeon: Jerline Pain, MD;  Location: [facility] ENDOSCOPY;  Service: Cardiovascular;  Laterality: N/A;   TEE WITHOUT CARDIOVERSION N/A 07/19/2016   Procedure: TRANSESOPHAGEAL ECHOCARDIOGRAM (TEE);  Surgeon: Sherren Mocha, MD;  Location: [facility];  Service: Open Heart Surgery;  Laterality: N/A;   TRANSCATHETER AORTIC VALVE REPLACEMENT, TRANSFEMORAL N/A 07/19/2016   Procedure: TRANSCATHETER AORTIC VALVE REPLACEMENT,  TRANSFEMORAL;  Surgeon: Sherren Mocha, MD;  Location: [facility];  Service: Open Heart Surgery;  Laterality: N/A;   TRANSURETHRAL RESECTION OF PROSTATE  09/2011   URETHRAL STRICTURE DILATATION  1980s    Current Medications: Current Meds  Medication Sig   ALPRAZolam (XANAX) 0.25 MG tablet TAKE (1) TABLET BY MOUTH THREE TIMES DAILY AS NEEDED FOR ANXIETY.   bisoprolol (ZEBETA) 5 MG tablet TAKE 0.5 TABLET (2.5MG  TOTAL) BY MOUTH ONCE DAILY.   cholecalciferol (VITAMIN D3) 25 MCG (1000 UNIT) tablet Take 1,000 Units by mouth in the morning and at bedtime.   digoxin (LANOXIN) 0.125 MG tablet Take 0.5 tablets (0.0625 mg total) by mouth daily.   furosemide (LASIX) 20 MG tablet Take 1 tablet (20 mg total) by mouth 3 (three) times a week. Every Mon, Wed, and Fri, can take on other days as needed   insulin glargine (LANTUS SOLOSTAR) 100 UNIT/ML Solostar Pen Inject 34 Units into  the skin daily.   isosorbide mononitrate (IMDUR) 30 MG 24 hr tablet Take 15 mg by mouth daily.   metFORMIN (GLUCOPHAGE) 500 MG tablet Take 1/2 tablet po daily   Methylcobalamin (B12-ACTIVE PO) Take by mouth.   nortriptyline (PAMELOR) 10 MG capsule TAKE 1 CAPSULES AT BEDTIME FOR BURNING IN FEET.[28 IN PACKS/ NONE IN BOTTLE]   OLANZapine (ZYPREXA) 2.5 MG tablet TAKE 1 TABLET TWICE DAILY AS DIRECTED.   psyllium (METAMUCIL) 58.6 % powder Take 1 packet by mouth daily.   rosuvastatin (CRESTOR) 5 MG tablet TAKE (1) TABLET BY MOUTH ONCE DAILY AT BEDTIME.   TRUEPLUS PEN NEEDLES 31G X 8 MM MISC USE WITH LANTUS SOLOSTAR EVERY EVENING   warfarin (COUMADIN) 6 MG tablet TAKE 1 TABLET BY MOUTH EVERY MONDAY,WEDNESDAY AND SATURDAY AND 1/2 TABLET ON SUNDAY,TUESDAY,THURSDAY AND FRIDAY.     Allergies:   Ambien [zolpidem tartrate], Lipitor [atorvastatin calcium], Ranitidine, Simvastatin, Xanax xr [alprazolam er], Cholestatin, and Clopidogrel bisulfate   Social History   Socioeconomic History   Marital status: Widowed    Spouse name:  Not on file   Number of children: 1   Years of education: Not on file   Highest education level: Not on file  Occupational History   Occupation: Retired  Tobacco Use   Smoking status: Former Smoker    Packs/day: 1.00    Years: 20.00    Pack years: 20.00    Types: Cigarettes    Start date: 07/04/1950    Quit date: 04/25/1992    Years since quitting: 28.7   Smokeless tobacco: Current User    Types: Chew  Substance and Sexual Activity   Alcohol use: No    Alcohol/week: 0.0 standard drinks   Drug use: No   Sexual activity: Not Currently  Other Topics Concern   Not on file  Social History Narrative   Not on file   Social Determinants of Health   Financial Resource Strain: Not on file  Food Insecurity: Not on file  Transportation Needs: Not on file  Physical Activity: Not on file  Stress: Not on file  Social Connections: Not on file     Family History: The patient's family history includes Diabetes in his father; Stroke in his mother. There is no history of Colon cancer.  ROS:   Please see the history of present illness.    All other systems reviewed and are negative.  EKGs/Labs/Other Studies Reviewed:    The following studies were reviewed today: Echo 05/12/2020: 1. Left ventricular ejection fraction, by estimation, is 35 to 40%. The  left ventricle has moderately decreased function. The left ventricle  demonstrates global hypokinesis. Left ventricular diastolic function could  not be evaluated.  2. Right ventricular systolic function is normal. The right ventricular  size is normal. There is mildly elevated pulmonary artery systolic  pressure.  3. Left atrial size was severely dilated.  4. Right atrial size was moderately dilated.  5. The mitral valve is normal in structure. Moderate mitral valve  regurgitation.  6. Tricuspid valve regurgitation is moderate.  7. The aortic valve has been repaired/replaced. Aortic valve  regurgitation is not  visualized. There is a 29 mm Edwards Sapien  prosthetic (TAVR) valve present in the aortic position. Echo findings are  consistent with normal structure and function of the  aortic valve prosthesis.  8. The inferior vena cava is normal in size with greater than 50%  respiratory variability, suggesting right atrial pressure of 3 mmHg.   Comparison(s): No significant  change from prior study. Prior images  reviewed side by side.   EKG:  EKG is not ordered today.    Recent Labs: 04/01/2020: NT-Pro BNP 985 08/31/2020: ALT 24 01/08/2021: B Natriuretic Peptide 69.7; BUN 17; Creatinine, Ser 1.19; Hemoglobin 15.1; Platelets 244; Potassium 4.4; Sodium 134  Recent Lipid Panel    Component Value Date/Time   CHOL 137 01/24/2020 0900   TRIG 147 01/24/2020 0900   HDL 37 (L) 01/24/2020 0900   CHOLHDL 4.3 07/23/2019 1452   CHOLHDL 5.9 08/01/2014 1003   VLDL 49 (H) 08/01/2014 1003   LDLCALC 74 01/24/2020 0900     Risk Assessment/Calculations:    CHA2DS2-VASc Score = 6  This indicates a 9.7% annual risk of stroke. The patient's score is based upon: CHF History: Yes HTN History: Yes Diabetes History: Yes Stroke History: No Vascular Disease History: Yes Age Score: 2 Gender Score: 0      Physical Exam:    VS:  BP 100/60    Pulse 76    Ht 6\' 1"  (1.854 m)    Wt 201 lb 9.6 oz (91.4 kg)    SpO2 96%    BMI 26.60 kg/m     Wt Readings from Last 3 Encounters:  01/11/21 201 lb 9.6 oz (91.4 kg)  01/08/21 202 lb 6.4 oz (91.8 kg)  12/25/20 205 lb 3.2 oz (93.1 kg)     GEN: Elderly male in no acute distress HEENT: Normal NECK: No JVD; No carotid bruits LYMPHATICS: No lymphadenopathy CARDIAC: Irregular, no murmurs, rubs, gallops RESPIRATORY:  Clear to auscultation without rales, wheezing or rhonchi  ABDOMEN: Soft, non-tender, non-distended MUSCULOSKELETAL:  No edema; No deformity  SKIN: Warm and dry NEUROLOGIC:  Alert, answers questions appropriately PSYCHIATRIC: Flat  affect  ASSESSMENT:    1. S/P TAVR (transcatheter aortic valve replacement)   2. Chronic systolic heart failure (HCC)   3. Permanent atrial fibrillation ([place])   4. Mitral valve insufficiency, unspecified etiology    PLAN:    In order of problems listed above:  1. Normal transcatheter heart valve function on most recent echo.  Study is reviewed and demonstrates normal transvalvular gradients and no paravalvular regurgitation.  He is scheduled for a follow-up echocardiogram in 6 months. 2. Stable, most recent LVEF 35 to 40%, under the care of Dr. Haroldine Laws.  Medical program reviewed with digoxin, spironolactone, isosorbide, furosemide. 3. On warfarin.  Rate well controlled.  On bisoprolol and digoxin. 4. No murmur appreciable on his exam today.  Has had mild to moderate mitral regurgitation in the past.  The patient appears to be clinically stable.  Advised that I am happy to see him back on an as-needed basis as he follows routinely every 6 months in the advanced heart failure clinic.        Medication Adjustments/Labs and Tests Ordered: Current medicines are reviewed at length with the patient today.  Concerns regarding medicines are outlined above.  No orders of the defined types were placed in this encounter.  No orders of the defined types were placed in this encounter.   There are no Patient Instructions on file for this visit.   Signed, Sherren Mocha, MD  01/11/2021 12:45 PM    [place]

## 2021-01-12 DIAGNOSIS — K5790 Diverticulosis of intestine, part unspecified, without perforation or abscess without bleeding: Secondary | ICD-10-CM | POA: Diagnosis not present

## 2021-01-12 DIAGNOSIS — I4811 Longstanding persistent atrial fibrillation: Secondary | ICD-10-CM | POA: Diagnosis not present

## 2021-01-12 DIAGNOSIS — E1151 Type 2 diabetes mellitus with diabetic peripheral angiopathy without gangrene: Secondary | ICD-10-CM | POA: Diagnosis not present

## 2021-01-12 DIAGNOSIS — E785 Hyperlipidemia, unspecified: Secondary | ICD-10-CM | POA: Diagnosis not present

## 2021-01-12 DIAGNOSIS — E1140 Type 2 diabetes mellitus with diabetic neuropathy, unspecified: Secondary | ICD-10-CM | POA: Diagnosis not present

## 2021-01-12 DIAGNOSIS — D692 Other nonthrombocytopenic purpura: Secondary | ICD-10-CM | POA: Diagnosis not present

## 2021-01-12 DIAGNOSIS — Z7901 Long term (current) use of anticoagulants: Secondary | ICD-10-CM | POA: Diagnosis not present

## 2021-01-12 DIAGNOSIS — Z7982 Long term (current) use of aspirin: Secondary | ICD-10-CM | POA: Diagnosis not present

## 2021-01-12 DIAGNOSIS — J8410 Pulmonary fibrosis, unspecified: Secondary | ICD-10-CM | POA: Diagnosis not present

## 2021-01-12 DIAGNOSIS — I878 Other specified disorders of veins: Secondary | ICD-10-CM | POA: Diagnosis not present

## 2021-01-12 DIAGNOSIS — Z794 Long term (current) use of insulin: Secondary | ICD-10-CM | POA: Diagnosis not present

## 2021-01-12 DIAGNOSIS — I130 Hypertensive heart and chronic kidney disease with heart failure and stage 1 through stage 4 chronic kidney disease, or unspecified chronic kidney disease: Secondary | ICD-10-CM | POA: Diagnosis not present

## 2021-01-12 DIAGNOSIS — N1832 Chronic kidney disease, stage 3b: Secondary | ICD-10-CM | POA: Diagnosis not present

## 2021-01-12 DIAGNOSIS — I5023 Acute on chronic systolic (congestive) heart failure: Secondary | ICD-10-CM | POA: Diagnosis not present

## 2021-01-12 DIAGNOSIS — Z9181 History of falling: Secondary | ICD-10-CM | POA: Diagnosis not present

## 2021-01-12 DIAGNOSIS — E1122 Type 2 diabetes mellitus with diabetic chronic kidney disease: Secondary | ICD-10-CM | POA: Diagnosis not present

## 2021-01-12 DIAGNOSIS — Z7984 Long term (current) use of oral hypoglycemic drugs: Secondary | ICD-10-CM | POA: Diagnosis not present

## 2021-01-13 DIAGNOSIS — L821 Other seborrheic keratosis: Secondary | ICD-10-CM | POA: Diagnosis not present

## 2021-01-13 DIAGNOSIS — C44311 Basal cell carcinoma of skin of nose: Secondary | ICD-10-CM | POA: Diagnosis not present

## 2021-01-13 DIAGNOSIS — X32XXXD Exposure to sunlight, subsequent encounter: Secondary | ICD-10-CM | POA: Diagnosis not present

## 2021-01-13 DIAGNOSIS — L814 Other melanin hyperpigmentation: Secondary | ICD-10-CM | POA: Diagnosis not present

## 2021-01-13 DIAGNOSIS — L570 Actinic keratosis: Secondary | ICD-10-CM | POA: Diagnosis not present

## 2021-01-13 DIAGNOSIS — L820 Inflamed seborrheic keratosis: Secondary | ICD-10-CM | POA: Diagnosis not present

## 2021-01-19 DIAGNOSIS — D692 Other nonthrombocytopenic purpura: Secondary | ICD-10-CM | POA: Diagnosis not present

## 2021-01-19 DIAGNOSIS — Z794 Long term (current) use of insulin: Secondary | ICD-10-CM | POA: Diagnosis not present

## 2021-01-19 DIAGNOSIS — N1832 Chronic kidney disease, stage 3b: Secondary | ICD-10-CM | POA: Diagnosis not present

## 2021-01-19 DIAGNOSIS — E1151 Type 2 diabetes mellitus with diabetic peripheral angiopathy without gangrene: Secondary | ICD-10-CM | POA: Diagnosis not present

## 2021-01-19 DIAGNOSIS — I4811 Longstanding persistent atrial fibrillation: Secondary | ICD-10-CM | POA: Diagnosis not present

## 2021-01-19 DIAGNOSIS — E1140 Type 2 diabetes mellitus with diabetic neuropathy, unspecified: Secondary | ICD-10-CM | POA: Diagnosis not present

## 2021-01-19 DIAGNOSIS — E785 Hyperlipidemia, unspecified: Secondary | ICD-10-CM | POA: Diagnosis not present

## 2021-01-19 DIAGNOSIS — E1122 Type 2 diabetes mellitus with diabetic chronic kidney disease: Secondary | ICD-10-CM | POA: Diagnosis not present

## 2021-01-19 DIAGNOSIS — J8410 Pulmonary fibrosis, unspecified: Secondary | ICD-10-CM | POA: Diagnosis not present

## 2021-01-19 DIAGNOSIS — Z7901 Long term (current) use of anticoagulants: Secondary | ICD-10-CM | POA: Diagnosis not present

## 2021-01-19 DIAGNOSIS — Z7982 Long term (current) use of aspirin: Secondary | ICD-10-CM | POA: Diagnosis not present

## 2021-01-19 DIAGNOSIS — I130 Hypertensive heart and chronic kidney disease with heart failure and stage 1 through stage 4 chronic kidney disease, or unspecified chronic kidney disease: Secondary | ICD-10-CM | POA: Diagnosis not present

## 2021-01-19 DIAGNOSIS — Z9181 History of falling: Secondary | ICD-10-CM | POA: Diagnosis not present

## 2021-01-19 DIAGNOSIS — Z7984 Long term (current) use of oral hypoglycemic drugs: Secondary | ICD-10-CM | POA: Diagnosis not present

## 2021-01-19 DIAGNOSIS — K5790 Diverticulosis of intestine, part unspecified, without perforation or abscess without bleeding: Secondary | ICD-10-CM | POA: Diagnosis not present

## 2021-01-19 DIAGNOSIS — I878 Other specified disorders of veins: Secondary | ICD-10-CM | POA: Diagnosis not present

## 2021-01-19 DIAGNOSIS — I5023 Acute on chronic systolic (congestive) heart failure: Secondary | ICD-10-CM | POA: Diagnosis not present

## 2021-01-20 ENCOUNTER — Other Ambulatory Visit: Payer: Self-pay | Admitting: Family Medicine

## 2021-01-21 DIAGNOSIS — I5023 Acute on chronic systolic (congestive) heart failure: Secondary | ICD-10-CM | POA: Diagnosis not present

## 2021-01-21 DIAGNOSIS — I130 Hypertensive heart and chronic kidney disease with heart failure and stage 1 through stage 4 chronic kidney disease, or unspecified chronic kidney disease: Secondary | ICD-10-CM | POA: Diagnosis not present

## 2021-01-21 DIAGNOSIS — E1140 Type 2 diabetes mellitus with diabetic neuropathy, unspecified: Secondary | ICD-10-CM | POA: Diagnosis not present

## 2021-01-22 ENCOUNTER — Other Ambulatory Visit (INDEPENDENT_AMBULATORY_CARE_PROVIDER_SITE_OTHER): Payer: PPO

## 2021-01-22 ENCOUNTER — Other Ambulatory Visit: Payer: Self-pay

## 2021-01-22 DIAGNOSIS — Z7901 Long term (current) use of anticoagulants: Secondary | ICD-10-CM | POA: Diagnosis not present

## 2021-01-22 LAB — POCT INR: INR: 2.5 (ref 2.0–3.0)

## 2021-01-28 ENCOUNTER — Telehealth: Payer: Self-pay | Admitting: Family Medicine

## 2021-01-28 NOTE — Telephone Encounter (Signed)
Nurses I reviewed over his glucose readings Please inquire with family what doses of insulin as he currently is utilizing? Also please remind him that the labs that were ordered in February need to be completed before his next office visit  (Fasting glucoses running between 130 to 175 sometimes 190 Lunchtime running in the mid 200s before dinner running in the upper 170s sometimes mid 200s evening time running in the low to mid 200s)

## 2021-01-28 NOTE — Telephone Encounter (Signed)
[name] [name] contacted. Pt is taking 36 units Lantus at around 1 pm. Also informed niece that pt has lab orders in system and provider would like them done before next office visit. [name] [name] verbalized understanding.

## 2021-02-01 DIAGNOSIS — Z7901 Long term (current) use of anticoagulants: Secondary | ICD-10-CM | POA: Diagnosis not present

## 2021-02-01 DIAGNOSIS — I878 Other specified disorders of veins: Secondary | ICD-10-CM | POA: Diagnosis not present

## 2021-02-01 DIAGNOSIS — E1151 Type 2 diabetes mellitus with diabetic peripheral angiopathy without gangrene: Secondary | ICD-10-CM | POA: Diagnosis not present

## 2021-02-01 DIAGNOSIS — J8410 Pulmonary fibrosis, unspecified: Secondary | ICD-10-CM | POA: Diagnosis not present

## 2021-02-01 DIAGNOSIS — I4811 Longstanding persistent atrial fibrillation: Secondary | ICD-10-CM | POA: Diagnosis not present

## 2021-02-01 DIAGNOSIS — K5790 Diverticulosis of intestine, part unspecified, without perforation or abscess without bleeding: Secondary | ICD-10-CM | POA: Diagnosis not present

## 2021-02-01 DIAGNOSIS — E785 Hyperlipidemia, unspecified: Secondary | ICD-10-CM | POA: Diagnosis not present

## 2021-02-01 DIAGNOSIS — D692 Other nonthrombocytopenic purpura: Secondary | ICD-10-CM | POA: Diagnosis not present

## 2021-02-01 DIAGNOSIS — N1832 Chronic kidney disease, stage 3b: Secondary | ICD-10-CM | POA: Diagnosis not present

## 2021-02-01 DIAGNOSIS — I5023 Acute on chronic systolic (congestive) heart failure: Secondary | ICD-10-CM | POA: Diagnosis not present

## 2021-02-01 DIAGNOSIS — Z7984 Long term (current) use of oral hypoglycemic drugs: Secondary | ICD-10-CM | POA: Diagnosis not present

## 2021-02-01 DIAGNOSIS — I130 Hypertensive heart and chronic kidney disease with heart failure and stage 1 through stage 4 chronic kidney disease, or unspecified chronic kidney disease: Secondary | ICD-10-CM | POA: Diagnosis not present

## 2021-02-01 DIAGNOSIS — Z794 Long term (current) use of insulin: Secondary | ICD-10-CM | POA: Diagnosis not present

## 2021-02-01 DIAGNOSIS — E1122 Type 2 diabetes mellitus with diabetic chronic kidney disease: Secondary | ICD-10-CM | POA: Diagnosis not present

## 2021-02-01 DIAGNOSIS — Z9181 History of falling: Secondary | ICD-10-CM | POA: Diagnosis not present

## 2021-02-01 DIAGNOSIS — E1140 Type 2 diabetes mellitus with diabetic neuropathy, unspecified: Secondary | ICD-10-CM | POA: Diagnosis not present

## 2021-02-01 DIAGNOSIS — Z7982 Long term (current) use of aspirin: Secondary | ICD-10-CM | POA: Diagnosis not present

## 2021-02-01 NOTE — Telephone Encounter (Signed)
I would recommend going up to 38 units

## 2021-02-01 NOTE — Telephone Encounter (Signed)
36 units please

## 2021-02-01 NOTE — Telephone Encounter (Signed)
Pt niece [name] contacted; pt niece states she had told us 36 units but it is really 34 units. Does pt go up to 36 or go to 38 units? Please advise. Thank you

## 2021-02-01 NOTE — Telephone Encounter (Signed)
Left message to return call [name] [name]- DPR)

## 2021-02-02 DIAGNOSIS — E785 Hyperlipidemia, unspecified: Secondary | ICD-10-CM | POA: Diagnosis not present

## 2021-02-02 DIAGNOSIS — E1122 Type 2 diabetes mellitus with diabetic chronic kidney disease: Secondary | ICD-10-CM | POA: Diagnosis not present

## 2021-02-02 DIAGNOSIS — N289 Disorder of kidney and ureter, unspecified: Secondary | ICD-10-CM | POA: Diagnosis not present

## 2021-02-02 DIAGNOSIS — Z794 Long term (current) use of insulin: Secondary | ICD-10-CM | POA: Diagnosis not present

## 2021-02-02 DIAGNOSIS — N1832 Chronic kidney disease, stage 3b: Secondary | ICD-10-CM | POA: Diagnosis not present

## 2021-02-02 NOTE — Telephone Encounter (Signed)
Barbara notified.

## 2021-02-03 LAB — BASIC METABOLIC PANEL
BUN/Creatinine Ratio: 17 (ref 10–24)
BUN: 19 mg/dL (ref 8–27)
CO2: 21 mmol/L (ref 20–29)
Calcium: 9.4 mg/dL (ref 8.6–10.2)
Chloride: 100 mmol/L (ref 96–106)
Creatinine, Ser: 1.1 mg/dL (ref 0.76–1.27)
Glucose: 163 mg/dL — ABNORMAL HIGH (ref 65–99)
Potassium: 4.5 mmol/L (ref 3.5–5.2)
Sodium: 137 mmol/L (ref 134–144)
eGFR: 65 mL/min/{1.73_m2} (ref >59)

## 2021-02-03 LAB — LIPID PANEL
Chol/HDL Ratio: 4.3 ratio (ref 0.0–5.0)
Cholesterol, Total: 150 mg/dL (ref 100–199)
HDL: 35 mg/dL — ABNORMAL LOW (ref >39)
LDL Chol Calc (NIH): 92 mg/dL (ref 0–99)
Triglycerides: 130 mg/dL (ref 0–149)
VLDL Cholesterol Cal: 23 mg/dL (ref 5–40)

## 2021-02-03 LAB — HEMOGLOBIN A1C
Est. average glucose Bld gHb Est-mCnc: 217 mg/dL
Hgb A1c MFr Bld: 9.2 % — ABNORMAL HIGH (ref 4.8–5.6)

## 2021-02-04 ENCOUNTER — Telehealth: Payer: Self-pay

## 2021-02-04 NOTE — Telephone Encounter (Signed)
Ben contacted and verbalized understanding.

## 2021-02-04 NOTE — Telephone Encounter (Signed)
Ben with [facility] is calling in reference to [facility] home Physical Therapy  2 week 1 1 week 1  Ben call back [phone]

## 2021-02-04 NOTE — Telephone Encounter (Signed)
That would be fine thank you

## 2021-02-08 DIAGNOSIS — E119 Type 2 diabetes mellitus without complications: Secondary | ICD-10-CM | POA: Diagnosis not present

## 2021-02-08 DIAGNOSIS — I5023 Acute on chronic systolic (congestive) heart failure: Secondary | ICD-10-CM | POA: Diagnosis not present

## 2021-02-08 DIAGNOSIS — I4811 Longstanding persistent atrial fibrillation: Secondary | ICD-10-CM | POA: Diagnosis not present

## 2021-02-08 DIAGNOSIS — E1151 Type 2 diabetes mellitus with diabetic peripheral angiopathy without gangrene: Secondary | ICD-10-CM | POA: Diagnosis not present

## 2021-02-08 DIAGNOSIS — Z9181 History of falling: Secondary | ICD-10-CM | POA: Diagnosis not present

## 2021-02-08 DIAGNOSIS — E1122 Type 2 diabetes mellitus with diabetic chronic kidney disease: Secondary | ICD-10-CM | POA: Diagnosis not present

## 2021-02-08 DIAGNOSIS — Z794 Long term (current) use of insulin: Secondary | ICD-10-CM | POA: Diagnosis not present

## 2021-02-08 DIAGNOSIS — J8410 Pulmonary fibrosis, unspecified: Secondary | ICD-10-CM | POA: Diagnosis not present

## 2021-02-08 DIAGNOSIS — I130 Hypertensive heart and chronic kidney disease with heart failure and stage 1 through stage 4 chronic kidney disease, or unspecified chronic kidney disease: Secondary | ICD-10-CM | POA: Diagnosis not present

## 2021-02-08 DIAGNOSIS — N1832 Chronic kidney disease, stage 3b: Secondary | ICD-10-CM | POA: Diagnosis not present

## 2021-02-08 DIAGNOSIS — E1140 Type 2 diabetes mellitus with diabetic neuropathy, unspecified: Secondary | ICD-10-CM | POA: Diagnosis not present

## 2021-02-08 DIAGNOSIS — Z7901 Long term (current) use of anticoagulants: Secondary | ICD-10-CM | POA: Diagnosis not present

## 2021-02-08 DIAGNOSIS — D692 Other nonthrombocytopenic purpura: Secondary | ICD-10-CM | POA: Diagnosis not present

## 2021-02-08 DIAGNOSIS — Z7982 Long term (current) use of aspirin: Secondary | ICD-10-CM | POA: Diagnosis not present

## 2021-02-08 DIAGNOSIS — I878 Other specified disorders of veins: Secondary | ICD-10-CM | POA: Diagnosis not present

## 2021-02-08 DIAGNOSIS — Z7984 Long term (current) use of oral hypoglycemic drugs: Secondary | ICD-10-CM | POA: Diagnosis not present

## 2021-02-08 DIAGNOSIS — K5790 Diverticulosis of intestine, part unspecified, without perforation or abscess without bleeding: Secondary | ICD-10-CM | POA: Diagnosis not present

## 2021-02-08 DIAGNOSIS — E785 Hyperlipidemia, unspecified: Secondary | ICD-10-CM | POA: Diagnosis not present

## 2021-02-15 ENCOUNTER — Other Ambulatory Visit: Payer: Self-pay | Admitting: Cardiology

## 2021-02-15 ENCOUNTER — Other Ambulatory Visit: Payer: Self-pay | Admitting: Family Medicine

## 2021-02-17 DIAGNOSIS — I130 Hypertensive heart and chronic kidney disease with heart failure and stage 1 through stage 4 chronic kidney disease, or unspecified chronic kidney disease: Secondary | ICD-10-CM | POA: Diagnosis not present

## 2021-02-17 DIAGNOSIS — N1832 Chronic kidney disease, stage 3b: Secondary | ICD-10-CM | POA: Diagnosis not present

## 2021-02-17 DIAGNOSIS — J8410 Pulmonary fibrosis, unspecified: Secondary | ICD-10-CM | POA: Diagnosis not present

## 2021-02-17 DIAGNOSIS — K5790 Diverticulosis of intestine, part unspecified, without perforation or abscess without bleeding: Secondary | ICD-10-CM | POA: Diagnosis not present

## 2021-02-17 DIAGNOSIS — Z7901 Long term (current) use of anticoagulants: Secondary | ICD-10-CM | POA: Diagnosis not present

## 2021-02-17 DIAGNOSIS — Z7982 Long term (current) use of aspirin: Secondary | ICD-10-CM | POA: Diagnosis not present

## 2021-02-17 DIAGNOSIS — I4811 Longstanding persistent atrial fibrillation: Secondary | ICD-10-CM | POA: Diagnosis not present

## 2021-02-17 DIAGNOSIS — I5023 Acute on chronic systolic (congestive) heart failure: Secondary | ICD-10-CM | POA: Diagnosis not present

## 2021-02-17 DIAGNOSIS — Z9181 History of falling: Secondary | ICD-10-CM | POA: Diagnosis not present

## 2021-02-17 DIAGNOSIS — Z794 Long term (current) use of insulin: Secondary | ICD-10-CM | POA: Diagnosis not present

## 2021-02-17 DIAGNOSIS — E785 Hyperlipidemia, unspecified: Secondary | ICD-10-CM | POA: Diagnosis not present

## 2021-02-17 DIAGNOSIS — E1140 Type 2 diabetes mellitus with diabetic neuropathy, unspecified: Secondary | ICD-10-CM | POA: Diagnosis not present

## 2021-02-17 DIAGNOSIS — I878 Other specified disorders of veins: Secondary | ICD-10-CM | POA: Diagnosis not present

## 2021-02-17 DIAGNOSIS — Z7984 Long term (current) use of oral hypoglycemic drugs: Secondary | ICD-10-CM | POA: Diagnosis not present

## 2021-02-17 DIAGNOSIS — E1151 Type 2 diabetes mellitus with diabetic peripheral angiopathy without gangrene: Secondary | ICD-10-CM | POA: Diagnosis not present

## 2021-02-17 DIAGNOSIS — D692 Other nonthrombocytopenic purpura: Secondary | ICD-10-CM | POA: Diagnosis not present

## 2021-02-17 DIAGNOSIS — E1122 Type 2 diabetes mellitus with diabetic chronic kidney disease: Secondary | ICD-10-CM | POA: Diagnosis not present

## 2021-02-23 ENCOUNTER — Other Ambulatory Visit: Payer: Self-pay

## 2021-02-23 ENCOUNTER — Other Ambulatory Visit (INDEPENDENT_AMBULATORY_CARE_PROVIDER_SITE_OTHER): Payer: PPO | Admitting: *Deleted

## 2021-02-23 DIAGNOSIS — Z7901 Long term (current) use of anticoagulants: Secondary | ICD-10-CM

## 2021-02-23 LAB — POCT INR: INR: 1.5 — AB (ref 2.0–3.0)

## 2021-02-24 DIAGNOSIS — Z08 Encounter for follow-up examination after completed treatment for malignant neoplasm: Secondary | ICD-10-CM | POA: Diagnosis not present

## 2021-02-24 DIAGNOSIS — L570 Actinic keratosis: Secondary | ICD-10-CM | POA: Diagnosis not present

## 2021-02-24 DIAGNOSIS — X32XXXD Exposure to sunlight, subsequent encounter: Secondary | ICD-10-CM | POA: Diagnosis not present

## 2021-02-24 DIAGNOSIS — Z85828 Personal history of other malignant neoplasm of skin: Secondary | ICD-10-CM | POA: Diagnosis not present

## 2021-03-16 ENCOUNTER — Other Ambulatory Visit: Payer: PPO

## 2021-03-16 DIAGNOSIS — E119 Type 2 diabetes mellitus without complications: Secondary | ICD-10-CM | POA: Diagnosis not present

## 2021-03-24 ENCOUNTER — Ambulatory Visit (INDEPENDENT_AMBULATORY_CARE_PROVIDER_SITE_OTHER): Payer: PPO | Admitting: Family Medicine

## 2021-03-24 ENCOUNTER — Encounter: Payer: Self-pay | Admitting: Family Medicine

## 2021-03-24 ENCOUNTER — Other Ambulatory Visit: Payer: Self-pay

## 2021-03-24 VITALS — BP 103/59 | HR 75 | Temp 97.8°F | Ht 73.0 in | Wt 200.4 lb

## 2021-03-24 DIAGNOSIS — R29898 Other symptoms and signs involving the musculoskeletal system: Secondary | ICD-10-CM

## 2021-03-24 DIAGNOSIS — I4811 Longstanding persistent atrial fibrillation: Secondary | ICD-10-CM | POA: Diagnosis not present

## 2021-03-24 DIAGNOSIS — Z794 Long term (current) use of insulin: Secondary | ICD-10-CM

## 2021-03-24 DIAGNOSIS — R270 Ataxia, unspecified: Secondary | ICD-10-CM

## 2021-03-24 DIAGNOSIS — E1122 Type 2 diabetes mellitus with diabetic chronic kidney disease: Secondary | ICD-10-CM

## 2021-03-24 DIAGNOSIS — E1169 Type 2 diabetes mellitus with other specified complication: Secondary | ICD-10-CM | POA: Diagnosis not present

## 2021-03-24 DIAGNOSIS — E785 Hyperlipidemia, unspecified: Secondary | ICD-10-CM | POA: Diagnosis not present

## 2021-03-24 DIAGNOSIS — I10 Essential (primary) hypertension: Secondary | ICD-10-CM

## 2021-03-24 DIAGNOSIS — R296 Repeated falls: Secondary | ICD-10-CM | POA: Diagnosis not present

## 2021-03-24 DIAGNOSIS — Z7901 Long term (current) use of anticoagulants: Secondary | ICD-10-CM | POA: Diagnosis not present

## 2021-03-24 DIAGNOSIS — N1832 Chronic kidney disease, stage 3b: Secondary | ICD-10-CM | POA: Diagnosis not present

## 2021-03-24 LAB — POCT INR: INR: 1.9 — AB (ref 2.0–3.0)

## 2021-03-24 NOTE — Progress Notes (Signed)
   Subjective:    Patient ID: [name], male    DOB: 07-30-1932, 87 y.o.   MRN: [id number]  Diabetes He presents for his follow-up diabetic visit. He has type 2 diabetes mellitus. He is compliant with treatment all of the time. Home blood sugar record trend: brought in sugar readings.   Pt complaining of back and knee pain.  Patient with chronic low back pain and discomfort does not radiate down the legs Also has chronic bilateral knee pain makes it difficult for him to get up he has had a couple different falls out of the bed because of his legs giving way Too weak to walk any long distance Unstable on his feet uses a walker Did benefit from home health as well as physical therapy family request continuation of this  Encounter for current long-term use of anticoagulants - Plan: POCT INR  Longstanding persistent atrial fibrillation ([place])  Ataxia - Plan: Ambulatory referral to Home Health  Weakness of both lower extremities - Plan: Ambulatory referral to [facility]  Frequent falls - Plan: Ambulatory referral to [facility]  Hyperlipidemia associated with type 2 diabetes mellitus ([place]) - Plan: Lipid panel  Primary hypertension - Plan: Comprehensive metabolic panel  Type 2 diabetes mellitus with stage 3b chronic kidney disease, with long-term current use of insulin ([place]) - Plan: Hemoglobin A1c   Results for orders placed or performed in visit on 03/24/21  POCT INR  Result Value Ref Range   INR 1.9 (A) 2.0 - 3.0     Review of Systems See above    Objective:   Physical Exam Lungs clear heart rate is controlled irregular extremities no edema skin warm dry venous insufficiency noted in the feet diabetic foot exam normal Patient week difficult for him to stand up unsteady      Assessment & Plan:  1. Encounter for current long-term use of anticoagulants INR 1.9 continue current measures follow-up again in 1 month - POCT INR  2. Longstanding persistent atrial  fibrillation (HCC) Heart rate controlled continue current measures no sign of CHF going on currently  3. Ataxia Use walker patient would benefit from home physical therapy would also benefit from home health he has an existing home health and physical therapy we will try to continue this Patient is homebound cannot get out except when family takes him - Ambulatory referral to [facility]  4. Weakness of both lower extremities Uses a walker but would benefit from using a wheelchair when utilizing long distances - Ambulatory referral to [facility]  5. Frequent falls Using walker Home physical therapy Home health consult With - Ambulatory referral to [facility]  6. Hyperlipidemia associated with type 2 diabetes mellitus (HCC) Check lipid panel Keep diabetes under good control increase long-acting insulin to 40 units Send [place] some readings in several weeks If any low sugars notify us  - Lipid panel  7. Primary hypertension Blood pressure decent control continue current measures He is on minimal dosing Blood pressure on the low range - Comprehensive metabolic panel  8. Type 2 diabetes mellitus with stage 3b chronic kidney disease, with long-term current use of insulin (HCC) Previous A1c above 9 hopefully with adjustment of insulin we will get it into the eights we are not expecting to get into the sevens do not want low blood sugars - Hemoglobin A1c Follow-up in 3 months follow-up sooner problems

## 2021-03-31 DIAGNOSIS — N183 Chronic kidney disease, stage 3 unspecified: Secondary | ICD-10-CM | POA: Diagnosis not present

## 2021-03-31 DIAGNOSIS — G309 Alzheimer's disease, unspecified: Secondary | ICD-10-CM | POA: Diagnosis not present

## 2021-03-31 DIAGNOSIS — I5022 Chronic systolic (congestive) heart failure: Secondary | ICD-10-CM | POA: Diagnosis not present

## 2021-03-31 DIAGNOSIS — E1151 Type 2 diabetes mellitus with diabetic peripheral angiopathy without gangrene: Secondary | ICD-10-CM | POA: Diagnosis not present

## 2021-03-31 DIAGNOSIS — E1165 Type 2 diabetes mellitus with hyperglycemia: Secondary | ICD-10-CM | POA: Diagnosis not present

## 2021-03-31 DIAGNOSIS — F0280 Dementia in other diseases classified elsewhere without behavioral disturbance: Secondary | ICD-10-CM | POA: Diagnosis not present

## 2021-03-31 DIAGNOSIS — I25118 Atherosclerotic heart disease of native coronary artery with other forms of angina pectoris: Secondary | ICD-10-CM | POA: Diagnosis not present

## 2021-03-31 DIAGNOSIS — D6869 Other thrombophilia: Secondary | ICD-10-CM | POA: Diagnosis not present

## 2021-03-31 DIAGNOSIS — E1159 Type 2 diabetes mellitus with other circulatory complications: Secondary | ICD-10-CM | POA: Diagnosis not present

## 2021-03-31 DIAGNOSIS — E1122 Type 2 diabetes mellitus with diabetic chronic kidney disease: Secondary | ICD-10-CM | POA: Diagnosis not present

## 2021-03-31 DIAGNOSIS — I480 Paroxysmal atrial fibrillation: Secondary | ICD-10-CM | POA: Diagnosis not present

## 2021-03-31 DIAGNOSIS — D692 Other nonthrombocytopenic purpura: Secondary | ICD-10-CM | POA: Diagnosis not present

## 2021-04-13 ENCOUNTER — Telehealth: Payer: Self-pay

## 2021-04-13 NOTE — Telephone Encounter (Signed)
[name] calling to let Dr know [name] [name] tested positive for Covid he was around someone with Covid Pt is not showing symptoms Pt wants to know what to do.   Pt call back [phone]

## 2021-04-13 NOTE — Telephone Encounter (Signed)
[name] called back test is negative and wants to cancel out message

## 2021-04-13 NOTE — Telephone Encounter (Signed)
So noted-message is canceled

## 2021-04-22 ENCOUNTER — Other Ambulatory Visit (INDEPENDENT_AMBULATORY_CARE_PROVIDER_SITE_OTHER): Payer: PPO | Admitting: *Deleted

## 2021-04-22 ENCOUNTER — Other Ambulatory Visit: Payer: Self-pay

## 2021-04-22 DIAGNOSIS — Z794 Long term (current) use of insulin: Secondary | ICD-10-CM | POA: Diagnosis not present

## 2021-04-22 DIAGNOSIS — E1169 Type 2 diabetes mellitus with other specified complication: Secondary | ICD-10-CM | POA: Diagnosis not present

## 2021-04-22 DIAGNOSIS — E785 Hyperlipidemia, unspecified: Secondary | ICD-10-CM | POA: Diagnosis not present

## 2021-04-22 DIAGNOSIS — N1832 Chronic kidney disease, stage 3b: Secondary | ICD-10-CM | POA: Diagnosis not present

## 2021-04-22 DIAGNOSIS — I10 Essential (primary) hypertension: Secondary | ICD-10-CM | POA: Diagnosis not present

## 2021-04-22 DIAGNOSIS — E1122 Type 2 diabetes mellitus with diabetic chronic kidney disease: Secondary | ICD-10-CM | POA: Diagnosis not present

## 2021-04-22 DIAGNOSIS — Z7901 Long term (current) use of anticoagulants: Secondary | ICD-10-CM | POA: Diagnosis not present

## 2021-04-22 LAB — POCT INR: INR: 2.5 (ref 2.0–3.0)

## 2021-04-23 ENCOUNTER — Other Ambulatory Visit: Payer: PPO

## 2021-04-23 LAB — COMPREHENSIVE METABOLIC PANEL
ALT: 25 IU/L (ref 0–44)
AST: 16 IU/L (ref 0–40)
Albumin/Globulin Ratio: 1.3 (ref 1.2–2.2)
Albumin: 4 g/dL (ref 3.6–4.6)
Alkaline Phosphatase: 151 IU/L — ABNORMAL HIGH (ref 44–121)
BUN/Creatinine Ratio: 14 (ref 10–24)
BUN: 18 mg/dL (ref 8–27)
Bilirubin Total: 0.5 mg/dL (ref 0.0–1.2)
CO2: 19 mmol/L — ABNORMAL LOW (ref 20–29)
Calcium: 9.2 mg/dL (ref 8.6–10.2)
Chloride: 98 mmol/L (ref 96–106)
Creatinine, Ser: 1.31 mg/dL — ABNORMAL HIGH (ref 0.76–1.27)
Globulin, Total: 3.1 g/dL (ref 1.5–4.5)
Glucose: 204 mg/dL — ABNORMAL HIGH (ref 65–99)
Potassium: 4.1 mmol/L (ref 3.5–5.2)
Sodium: 138 mmol/L (ref 134–144)
Total Protein: 7.1 g/dL (ref 6.0–8.5)
eGFR: 53 mL/min/{1.73_m2} — ABNORMAL LOW (ref >59)

## 2021-04-23 LAB — LIPID PANEL
Chol/HDL Ratio: 4.6 ratio (ref 0.0–5.0)
Cholesterol, Total: 162 mg/dL (ref 100–199)
HDL: 35 mg/dL — ABNORMAL LOW (ref >39)
LDL Chol Calc (NIH): 96 mg/dL (ref 0–99)
Triglycerides: 175 mg/dL — ABNORMAL HIGH (ref 0–149)
VLDL Cholesterol Cal: 31 mg/dL (ref 5–40)

## 2021-04-23 LAB — HEMOGLOBIN A1C
Est. average glucose Bld gHb Est-mCnc: 200 mg/dL
Hgb A1c MFr Bld: 8.6 % — ABNORMAL HIGH (ref 4.8–5.6)

## 2021-04-26 MED ORDER — METFORMIN HCL 500 MG PO TABS: ORAL_TABLET | ORAL | 5 refills | Status: DC

## 2021-04-26 NOTE — Progress Notes (Signed)
[facility]

## 2021-04-26 NOTE — Addendum Note (Signed)
Addended by: Dairl Ponder on: 04/26/2021 11:39 AM   Modules accepted: Orders

## 2021-04-30 ENCOUNTER — Ambulatory Visit: Payer: PPO | Admitting: Family Medicine

## 2021-05-20 ENCOUNTER — Other Ambulatory Visit: Payer: PPO

## 2021-05-20 ENCOUNTER — Ambulatory Visit (INDEPENDENT_AMBULATORY_CARE_PROVIDER_SITE_OTHER): Payer: PPO | Admitting: Family Medicine

## 2021-05-20 ENCOUNTER — Other Ambulatory Visit: Payer: Self-pay

## 2021-05-20 VITALS — BP 118/72 | HR 98 | Temp 97.3°F | Wt 194.0 lb

## 2021-05-20 DIAGNOSIS — R531 Weakness: Secondary | ICD-10-CM

## 2021-05-20 DIAGNOSIS — Z7901 Long term (current) use of anticoagulants: Secondary | ICD-10-CM

## 2021-05-20 DIAGNOSIS — R269 Unspecified abnormalities of gait and mobility: Secondary | ICD-10-CM | POA: Diagnosis not present

## 2021-05-20 DIAGNOSIS — F09 Unspecified mental disorder due to known physiological condition: Secondary | ICD-10-CM

## 2021-05-20 DIAGNOSIS — I25119 Atherosclerotic heart disease of native coronary artery with unspecified angina pectoris: Secondary | ICD-10-CM | POA: Diagnosis not present

## 2021-05-20 LAB — POCT INR: INR: 1.5 — AB (ref 2.0–3.0)

## 2021-05-20 MED ORDER — OLANZAPINE 2.5 MG PO TABS: ORAL_TABLET | ORAL | 11 refills | Status: DC

## 2021-05-20 NOTE — Progress Notes (Signed)
   Subjective:    Patient ID: [name] [name], male    DOB: 1932/10/17, 87 y.o.   MRN: [id number]  HPI Pt here for follow up. Pt family is requesting Physical Therapy with [facility] Tressia Danas). Pt has states issues with hand pain/joint pain. Pt not as mobile. Does have exercise ball to use with hands. Pt only walks in home, uses wheelchair for outdoor activities. For one week pt has not allowed aide or family to touch left arm. Pt has become a little bit more forgetful.  Patient on chronic Coumadin no bleeding issues Patient does have chronic weakness losing some weight not eating as well Has diabetes under fair control Patient uses a walker inside is homebound.  Uses a wheelchair outside Patient has chronic underlying heart disease Moderate cognitive deficits  Review of Systems     Objective:   Physical Exam Lungs are clear heart is irregular rate is controlled HEENT benign Extremities no edema      Assessment & Plan:  1. Encounter for current long-term use of anticoagulants Adjust Coumadin 6 mg every day recheck INR in 3 weeks - POCT INR  2. Weakness Home physical therapy patient is bedbound would benefit from physical therapy to help prevent falls - Ambulatory referral to Physical Therapy  3. Abnormal gait See above - Ambulatory referral to Physical Therapy  4. Coronary artery disease involving native coronary artery of native heart with angina pectoris ([place]) Tolerating medicine well  5. Cognitive dysfunction Patient still interactive but clearly is losing ground Also his weight is going down I believe this is a byproduct of not needing as much if he continues to lose weight may need to do further work-up Reduce Zyprexa to once per day  Follow-up 3 months INR 3 weeks

## 2021-05-21 ENCOUNTER — Ambulatory Visit: Payer: PPO | Admitting: Family Medicine

## 2021-05-26 ENCOUNTER — Telehealth: Payer: Self-pay | Admitting: *Deleted

## 2021-05-26 ENCOUNTER — Telehealth: Payer: Self-pay | Admitting: Family Medicine

## 2021-05-26 MED ORDER — OLANZAPINE 2.5 MG PO TABS: ORAL_TABLET | ORAL | 5 refills | Status: DC

## 2021-05-26 NOTE — Telephone Encounter (Signed)
These go ahead and increase his Zyprexa, 2.5 mg twice daily, #60, 5 refills, [name] is aware, please send in a new prescription thank you   [name] was told that if he is not tremendously better within the next 7 days to notify us

## 2021-05-26 NOTE — Telephone Encounter (Signed)
Barbara(DPR) called and stated since patient's Zyprexa 2.5 mg was decreased he is much more confused and having nightmares and thinking they are real and calling people in the room with him  [place]

## 2021-05-26 NOTE — Telephone Encounter (Signed)
Prescription sent electronically to pharmacy. Barbara(DPR) aware

## 2021-05-27 ENCOUNTER — Ambulatory Visit: Payer: PPO | Admitting: Family Medicine

## 2021-05-27 ENCOUNTER — Telehealth: Payer: Self-pay | Admitting: Family Medicine

## 2021-05-27 DIAGNOSIS — F09 Unspecified mental disorder due to known physiological condition: Secondary | ICD-10-CM

## 2021-05-27 DIAGNOSIS — R531 Weakness: Secondary | ICD-10-CM

## 2021-05-27 DIAGNOSIS — R269 Unspecified abnormalities of gait and mobility: Secondary | ICD-10-CM

## 2021-05-27 DIAGNOSIS — R270 Ataxia, unspecified: Secondary | ICD-10-CM

## 2021-05-27 DIAGNOSIS — Z9181 History of falling: Secondary | ICD-10-CM

## 2021-05-27 NOTE — Telephone Encounter (Signed)
May consult home health for consultation as well as physical therapy due to ataxia, leg weakness, risk of falls

## 2021-05-27 NOTE — Telephone Encounter (Signed)
Referral ordered in Epic. 

## 2021-05-27 NOTE — Telephone Encounter (Signed)
Niece-Barbara is requesting in home therapy for patient . She states not able to get around good and needing therapist to come in. She is requesting [facility] home health .

## 2021-06-07 DIAGNOSIS — I10 Essential (primary) hypertension: Secondary | ICD-10-CM | POA: Diagnosis not present

## 2021-06-09 ENCOUNTER — Other Ambulatory Visit: Payer: Self-pay

## 2021-06-09 ENCOUNTER — Other Ambulatory Visit (INDEPENDENT_AMBULATORY_CARE_PROVIDER_SITE_OTHER): Payer: PPO | Admitting: *Deleted

## 2021-06-09 ENCOUNTER — Other Ambulatory Visit: Payer: PPO

## 2021-06-09 ENCOUNTER — Telehealth: Payer: Self-pay | Admitting: Family Medicine

## 2021-06-09 DIAGNOSIS — Z7901 Long term (current) use of anticoagulants: Secondary | ICD-10-CM

## 2021-06-09 LAB — POCT INR: INR: 1.9 — AB (ref 2.0–3.0)

## 2021-06-09 NOTE — Telephone Encounter (Signed)
Glucose readings were reviewed These readings were sent to [place] via family  Please connect with family verify that he is using 34 units of insulin each evening If so I recommend 36 units.  Otherwise keep everything else the same Send [place] readings in several weeks Obviously if having frequent low sugars it is important to let us know

## 2021-06-10 NOTE — Telephone Encounter (Signed)
Verified current patient insulin dose is 40 units at 2 pm daily for , please advise next dose.

## 2021-06-11 NOTE — Telephone Encounter (Signed)
May increase from 40 units currently New dose 42 units Please make sure epic reflects this Monitor sugars if having any low sugar spells on a regular basis important to notify us  Send [place] readings in several weeks

## 2021-06-11 NOTE — Telephone Encounter (Signed)
[name] [name](DPR) advised per Dr Nicki Reaper: May increase from 40 units currently New dose 42 units Please make sure epic reflects this Monitor sugars if having any low sugar spells on a regular basis important to notify us   Send [place] readings in several weeks  [name] verbalized understanding.

## 2021-06-15 DIAGNOSIS — N183 Chronic kidney disease, stage 3 unspecified: Secondary | ICD-10-CM | POA: Diagnosis not present

## 2021-06-15 DIAGNOSIS — E1122 Type 2 diabetes mellitus with diabetic chronic kidney disease: Secondary | ICD-10-CM | POA: Diagnosis not present

## 2021-06-15 DIAGNOSIS — I130 Hypertensive heart and chronic kidney disease with heart failure and stage 1 through stage 4 chronic kidney disease, or unspecified chronic kidney disease: Secondary | ICD-10-CM | POA: Diagnosis not present

## 2021-06-15 DIAGNOSIS — E1151 Type 2 diabetes mellitus with diabetic peripheral angiopathy without gangrene: Secondary | ICD-10-CM | POA: Diagnosis not present

## 2021-06-15 DIAGNOSIS — F0280 Dementia in other diseases classified elsewhere without behavioral disturbance: Secondary | ICD-10-CM | POA: Diagnosis not present

## 2021-06-15 DIAGNOSIS — D631 Anemia in chronic kidney disease: Secondary | ICD-10-CM | POA: Diagnosis not present

## 2021-06-15 DIAGNOSIS — J8410 Pulmonary fibrosis, unspecified: Secondary | ICD-10-CM | POA: Diagnosis not present

## 2021-06-15 DIAGNOSIS — R270 Ataxia, unspecified: Secondary | ICD-10-CM | POA: Diagnosis not present

## 2021-06-15 DIAGNOSIS — G301 Alzheimer's disease with late onset: Secondary | ICD-10-CM | POA: Diagnosis not present

## 2021-06-15 DIAGNOSIS — E1142 Type 2 diabetes mellitus with diabetic polyneuropathy: Secondary | ICD-10-CM | POA: Diagnosis not present

## 2021-06-15 DIAGNOSIS — J45909 Unspecified asthma, uncomplicated: Secondary | ICD-10-CM | POA: Diagnosis not present

## 2021-06-15 DIAGNOSIS — N138 Other obstructive and reflux uropathy: Secondary | ICD-10-CM | POA: Diagnosis not present

## 2021-06-15 DIAGNOSIS — I25119 Atherosclerotic heart disease of native coronary artery with unspecified angina pectoris: Secondary | ICD-10-CM | POA: Diagnosis not present

## 2021-06-15 DIAGNOSIS — E785 Hyperlipidemia, unspecified: Secondary | ICD-10-CM | POA: Diagnosis not present

## 2021-06-15 DIAGNOSIS — I5043 Acute on chronic combined systolic (congestive) and diastolic (congestive) heart failure: Secondary | ICD-10-CM | POA: Diagnosis not present

## 2021-06-15 DIAGNOSIS — E1169 Type 2 diabetes mellitus with other specified complication: Secondary | ICD-10-CM | POA: Diagnosis not present

## 2021-06-15 DIAGNOSIS — D692 Other nonthrombocytopenic purpura: Secondary | ICD-10-CM | POA: Diagnosis not present

## 2021-06-15 DIAGNOSIS — M545 Low back pain, unspecified: Secondary | ICD-10-CM | POA: Diagnosis not present

## 2021-06-15 DIAGNOSIS — E1165 Type 2 diabetes mellitus with hyperglycemia: Secondary | ICD-10-CM | POA: Diagnosis not present

## 2021-06-15 DIAGNOSIS — I4891 Unspecified atrial fibrillation: Secondary | ICD-10-CM | POA: Diagnosis not present

## 2021-06-15 DIAGNOSIS — I951 Orthostatic hypotension: Secondary | ICD-10-CM | POA: Diagnosis not present

## 2021-06-15 DIAGNOSIS — G8929 Other chronic pain: Secondary | ICD-10-CM | POA: Diagnosis not present

## 2021-06-15 DIAGNOSIS — F5104 Psychophysiologic insomnia: Secondary | ICD-10-CM | POA: Diagnosis not present

## 2021-06-15 DIAGNOSIS — N401 Enlarged prostate with lower urinary tract symptoms: Secondary | ICD-10-CM | POA: Diagnosis not present

## 2021-06-15 DIAGNOSIS — I6529 Occlusion and stenosis of unspecified carotid artery: Secondary | ICD-10-CM | POA: Diagnosis not present

## 2021-06-16 ENCOUNTER — Telehealth (INDEPENDENT_AMBULATORY_CARE_PROVIDER_SITE_OTHER): Payer: PPO | Admitting: Family Medicine

## 2021-06-16 DIAGNOSIS — R3589 Other polyuria: Secondary | ICD-10-CM | POA: Diagnosis not present

## 2021-06-16 LAB — POCT URINALYSIS DIPSTICK
Spec Grav, UA: 1.015 (ref 1.010–1.025)
pH, UA: 6 (ref 5.0–8.0)

## 2021-06-16 NOTE — Telephone Encounter (Signed)
More than likely due to worsening dementia Urine culture being sent just in case When significantly anxious may utilize alprazolam that is already been prescribed If wanting to do a phone visit next week or a sooner follow-up next week we can do so

## 2021-06-16 NOTE — Telephone Encounter (Signed)
Patient would like you to call niece at [facility] with results.

## 2021-06-16 NOTE — Telephone Encounter (Signed)
Family dropped off urine sample: urine dipped and spun and ready for microscopic- dip results below Results for orders placed or performed in visit on 06/16/21  POCT urinalysis dipstick  Result Value Ref Range   Color, UA     Clarity, UA     Glucose, UA     Bilirubin, UA     Ketones, UA     Spec Grav, UA 1.015 1.010 - 1.025   Blood, UA     pH, UA 6.0 5.0 - 8.0   Protein, UA     Urobilinogen, UA     Nitrite, UA     Leukocytes, UA     Appearance     Odor

## 2021-06-16 NOTE — Telephone Encounter (Signed)
Urine sent for culture and left message to return call

## 2021-06-16 NOTE — Telephone Encounter (Signed)
Niece [name] contacted and verbalized understanding. Niece transferred up front to schedule phone visit for next week.

## 2021-06-16 NOTE — Telephone Encounter (Signed)
[name] states that [name] [name] has been all out of sorts and coming up with stuff out of his head for several days. He called her at [place] adamant he had to go to court and they couldn't convince him otherwise- he told her he had the paper and just stuff like that and they wanted to make sure it wasn't related to urine infection vs worsening dementia

## 2021-06-16 NOTE — Telephone Encounter (Signed)
Send for culture please

## 2021-06-18 LAB — URINE CULTURE

## 2021-06-21 DIAGNOSIS — D692 Other nonthrombocytopenic purpura: Secondary | ICD-10-CM | POA: Diagnosis not present

## 2021-06-21 DIAGNOSIS — E1142 Type 2 diabetes mellitus with diabetic polyneuropathy: Secondary | ICD-10-CM | POA: Diagnosis not present

## 2021-06-21 DIAGNOSIS — D631 Anemia in chronic kidney disease: Secondary | ICD-10-CM | POA: Diagnosis not present

## 2021-06-21 DIAGNOSIS — E1122 Type 2 diabetes mellitus with diabetic chronic kidney disease: Secondary | ICD-10-CM | POA: Diagnosis not present

## 2021-06-21 DIAGNOSIS — N183 Chronic kidney disease, stage 3 unspecified: Secondary | ICD-10-CM | POA: Diagnosis not present

## 2021-06-21 DIAGNOSIS — I6529 Occlusion and stenosis of unspecified carotid artery: Secondary | ICD-10-CM | POA: Diagnosis not present

## 2021-06-21 DIAGNOSIS — I5043 Acute on chronic combined systolic (congestive) and diastolic (congestive) heart failure: Secondary | ICD-10-CM | POA: Diagnosis not present

## 2021-06-21 DIAGNOSIS — E1151 Type 2 diabetes mellitus with diabetic peripheral angiopathy without gangrene: Secondary | ICD-10-CM | POA: Diagnosis not present

## 2021-06-21 DIAGNOSIS — E1169 Type 2 diabetes mellitus with other specified complication: Secondary | ICD-10-CM | POA: Diagnosis not present

## 2021-06-21 DIAGNOSIS — R270 Ataxia, unspecified: Secondary | ICD-10-CM | POA: Diagnosis not present

## 2021-06-21 DIAGNOSIS — E1165 Type 2 diabetes mellitus with hyperglycemia: Secondary | ICD-10-CM | POA: Diagnosis not present

## 2021-06-21 DIAGNOSIS — F5104 Psychophysiologic insomnia: Secondary | ICD-10-CM | POA: Diagnosis not present

## 2021-06-21 DIAGNOSIS — I4891 Unspecified atrial fibrillation: Secondary | ICD-10-CM | POA: Diagnosis not present

## 2021-06-21 DIAGNOSIS — J8410 Pulmonary fibrosis, unspecified: Secondary | ICD-10-CM | POA: Diagnosis not present

## 2021-06-21 DIAGNOSIS — E785 Hyperlipidemia, unspecified: Secondary | ICD-10-CM | POA: Diagnosis not present

## 2021-06-21 DIAGNOSIS — G301 Alzheimer's disease with late onset: Secondary | ICD-10-CM | POA: Diagnosis not present

## 2021-06-21 DIAGNOSIS — M545 Low back pain, unspecified: Secondary | ICD-10-CM | POA: Diagnosis not present

## 2021-06-21 DIAGNOSIS — N401 Enlarged prostate with lower urinary tract symptoms: Secondary | ICD-10-CM | POA: Diagnosis not present

## 2021-06-21 DIAGNOSIS — N138 Other obstructive and reflux uropathy: Secondary | ICD-10-CM | POA: Diagnosis not present

## 2021-06-21 DIAGNOSIS — J45909 Unspecified asthma, uncomplicated: Secondary | ICD-10-CM | POA: Diagnosis not present

## 2021-06-21 DIAGNOSIS — F0280 Dementia in other diseases classified elsewhere without behavioral disturbance: Secondary | ICD-10-CM | POA: Diagnosis not present

## 2021-06-21 DIAGNOSIS — I25119 Atherosclerotic heart disease of native coronary artery with unspecified angina pectoris: Secondary | ICD-10-CM | POA: Diagnosis not present

## 2021-06-21 DIAGNOSIS — I951 Orthostatic hypotension: Secondary | ICD-10-CM | POA: Diagnosis not present

## 2021-06-21 DIAGNOSIS — I130 Hypertensive heart and chronic kidney disease with heart failure and stage 1 through stage 4 chronic kidney disease, or unspecified chronic kidney disease: Secondary | ICD-10-CM | POA: Diagnosis not present

## 2021-06-21 DIAGNOSIS — G8929 Other chronic pain: Secondary | ICD-10-CM | POA: Diagnosis not present

## 2021-06-24 ENCOUNTER — Telehealth (INDEPENDENT_AMBULATORY_CARE_PROVIDER_SITE_OTHER): Payer: PPO | Admitting: Family Medicine

## 2021-06-24 ENCOUNTER — Other Ambulatory Visit: Payer: Self-pay

## 2021-06-24 DIAGNOSIS — G301 Alzheimer's disease with late onset: Secondary | ICD-10-CM | POA: Diagnosis not present

## 2021-06-24 DIAGNOSIS — F0281 Dementia in other diseases classified elsewhere with behavioral disturbance: Secondary | ICD-10-CM | POA: Diagnosis not present

## 2021-06-24 DIAGNOSIS — N183 Chronic kidney disease, stage 3 unspecified: Secondary | ICD-10-CM | POA: Diagnosis not present

## 2021-06-24 DIAGNOSIS — I5043 Acute on chronic combined systolic (congestive) and diastolic (congestive) heart failure: Secondary | ICD-10-CM | POA: Diagnosis not present

## 2021-06-24 DIAGNOSIS — E1122 Type 2 diabetes mellitus with diabetic chronic kidney disease: Secondary | ICD-10-CM | POA: Diagnosis not present

## 2021-06-24 DIAGNOSIS — I5022 Chronic systolic (congestive) heart failure: Secondary | ICD-10-CM | POA: Diagnosis not present

## 2021-06-24 DIAGNOSIS — I130 Hypertensive heart and chronic kidney disease with heart failure and stage 1 through stage 4 chronic kidney disease, or unspecified chronic kidney disease: Secondary | ICD-10-CM | POA: Diagnosis not present

## 2021-06-24 DIAGNOSIS — I25119 Atherosclerotic heart disease of native coronary artery with unspecified angina pectoris: Secondary | ICD-10-CM | POA: Diagnosis not present

## 2021-06-24 DIAGNOSIS — D631 Anemia in chronic kidney disease: Secondary | ICD-10-CM | POA: Diagnosis not present

## 2021-06-24 DIAGNOSIS — F02818 Dementia in other diseases classified elsewhere, unspecified severity, with other behavioral disturbance: Secondary | ICD-10-CM

## 2021-06-24 NOTE — Progress Notes (Addendum)
   Subjective:    Patient ID: [name] [name], male    DOB: 11/25/1931, 87 y.o.   MRN: [id number] Telephone call only does not have the ability to do video HPI Patient presents [name] does the speaking [name] [facility]) calls to report patient's dementia has been getting worse the last 2 weeks. Problem [name] relates that the patient's have a lot of agitation.  There have been frequent times where he has missed believes about what is going on.  Gets frequently fixated on things that are not real  Also becoming progressively weaker and having a harder time getting around has physical therapy trying to help him  Virtual Visit via Telephone Note  I connected with [place] on 06/24/21 at 11:00 AM EDT by telephone and verified that I am speaking with the correct person using two identifiers.  Location: Patient: home Provider: office   I discussed the limitations, risks, security and privacy concerns of performing an evaluation and management service by telephone and the availability of in person appointments. I also discussed with the patient that there may be a patient responsible charge related to this service. The patient expressed understanding and agreed to proceed.   History of Present Illness:    Observations/Objective:   Assessment and Plan:   Follow Up Instructions:    I discussed the assessment and treatment plan with the patient. The patient was provided an opportunity to ask questions and all were answered. The patient agreed with the plan and demonstrated an understanding of the instructions.   The patient was advised to call back or seek an in-person evaluation if the symptoms worsen or if the condition fails to improve as anticipated.  I provided 20 minutes of non-face-to-face time during this encounter.     Review of Systems     Objective:   Physical Exam Today's visit was via telephone Physical exam was not possible for this visit        Assessment  & Plan:  Patient is not aggressive He is not a threat to others He is currently on Zyprexa twice daily to try to help him with his behavior to cut down some of the getting upset about various issues Behavioral issues were discussed in detail for family and caretakers to do Xanax low-dose when necessary Did discuss palliative care with them They get their health care through advanced health care currently  We will communicate with cardiology about this patient given that he is getting weaker and increased risk of falling plus also shifting toward palliative care may want to potentially reconsider warfarin use and digoxin use  I was able to communicate with cardiology.  His cardiologist felt it was reasonable to stop Coumadin given his increased risk of falls and bleeding.  Family will be notified.

## 2021-06-30 ENCOUNTER — Other Ambulatory Visit: Payer: Self-pay

## 2021-06-30 ENCOUNTER — Telehealth: Payer: Self-pay | Admitting: Family Medicine

## 2021-06-30 NOTE — Telephone Encounter (Signed)
Left message to return call With [facility])

## 2021-06-30 NOTE — Telephone Encounter (Signed)
Pt niece [name] is wondering about pt taking dementia med TID instead of BID. Niece states that B.A. is more confused and talking about things that are not happening. Wondering if dementia meds would be better that Xanax. They gave pt a 0.25 mg xanax one night and he crashed and was a zombie the next day. [name] states she is not sure if it was the med that affected him that way or just because he had been up for several days. Please advise. Thank you

## 2021-06-30 NOTE — Progress Notes (Signed)
Pt niece [name] contacted and verbalized understanding. Coumadin d/c from pt med list

## 2021-06-30 NOTE — Telephone Encounter (Signed)
Sadly it truly sounds that he is having progressive dementia I will look into seeing if the medication can be given more frequently (After talking with [name] please send message back to me for further consideration on my part) Because the Xanax because such trouble with him it would be fine to hold off on utilizing Xanax  Progressive dementia often will get to the point where it takes a team of several people to be able to keep things relatively doable For some individuals they can get to the point where it is just not possible to keep them at their home if they become too much to handle Thru [facility] / [place] they can have a medical social worker talk with [name] to see if there are other additional help that can be offered to the home I am concerned that medication has a limited role But I will look into the medication further that she speaks of then we will get back with her later this week-Friday Also please ask [name] which agency is doing his home health care

## 2021-07-01 ENCOUNTER — Telehealth: Payer: Self-pay

## 2021-07-01 DIAGNOSIS — F02818 Dementia in other diseases classified elsewhere, unspecified severity, with other behavioral disturbance: Secondary | ICD-10-CM

## 2021-07-01 DIAGNOSIS — F0281 Dementia in other diseases classified elsewhere with behavioral disturbance: Secondary | ICD-10-CM

## 2021-07-01 NOTE — Telephone Encounter (Signed)
Spoke with [name] [name] and she has been informed per drs notes, she is agreeable to [facility] / social work orders. Patient is receiving services from [facility], patient is receiving PT services at home as well. Will await call in regards to any advise on medication changes if any are applicable.

## 2021-07-02 ENCOUNTER — Telehealth: Payer: Self-pay | Admitting: *Deleted

## 2021-07-02 NOTE — Telephone Encounter (Signed)
See 07/01/21 message and response

## 2021-07-02 NOTE — Telephone Encounter (Signed)
Lets make sure that we have a referral to [place] service worker to connect with [name] to see what type of help they can get for [facility]

## 2021-07-02 NOTE — Telephone Encounter (Signed)
Referral ordered in Epic. 

## 2021-07-02 NOTE — Chronic Care Management (AMB) (Signed)
  Chronic Care Management   Outreach Note  07/02/2021 Name: [name] [name] MRN: [id number] DOB: 1932/10/10  [name] [name] is a 87 y.o. year old male who is a primary care patient of Luking, Elayne Snare, MD. I reached out to [employer] by phone today in response to a referral sent by [name] [name]'s PCP, Dr. Wolfgang Phoenix      An unsuccessful telephone outreach was attempted today. The patient was referred to the case management team for assistance with care management and care coordination.   Follow Up Plan: A HIPAA compliant phone message was left for the patient providing contact information and requesting a return call. The care management team will reach out to the patient again over the next 7 days.  If patient returns call to provider office, please advise to call [place] at [phone].  [facility]  Direct Dial: [phone]

## 2021-07-02 NOTE — Telephone Encounter (Signed)
Leda Min, LPN    Spoke with [name] and she has been informed per drs notes, she is agreeable to [facility] / social work orders. Patient is receiving services from [facility], patient is receiving PT services at home as well. Will await call in regards to any advise on medication changes if any are applicable.

## 2021-07-05 ENCOUNTER — Other Ambulatory Visit (HOSPITAL_COMMUNITY): Payer: Self-pay | Admitting: Internal Medicine

## 2021-07-05 DIAGNOSIS — R270 Ataxia, unspecified: Secondary | ICD-10-CM | POA: Diagnosis not present

## 2021-07-05 DIAGNOSIS — D631 Anemia in chronic kidney disease: Secondary | ICD-10-CM | POA: Diagnosis not present

## 2021-07-05 DIAGNOSIS — J8410 Pulmonary fibrosis, unspecified: Secondary | ICD-10-CM | POA: Diagnosis not present

## 2021-07-05 DIAGNOSIS — N183 Chronic kidney disease, stage 3 unspecified: Secondary | ICD-10-CM | POA: Diagnosis not present

## 2021-07-05 DIAGNOSIS — G301 Alzheimer's disease with late onset: Secondary | ICD-10-CM | POA: Diagnosis not present

## 2021-07-05 DIAGNOSIS — I951 Orthostatic hypotension: Secondary | ICD-10-CM | POA: Diagnosis not present

## 2021-07-05 DIAGNOSIS — N138 Other obstructive and reflux uropathy: Secondary | ICD-10-CM | POA: Diagnosis not present

## 2021-07-05 DIAGNOSIS — N401 Enlarged prostate with lower urinary tract symptoms: Secondary | ICD-10-CM | POA: Diagnosis not present

## 2021-07-05 DIAGNOSIS — I25119 Atherosclerotic heart disease of native coronary artery with unspecified angina pectoris: Secondary | ICD-10-CM | POA: Diagnosis not present

## 2021-07-05 DIAGNOSIS — F0280 Dementia in other diseases classified elsewhere without behavioral disturbance: Secondary | ICD-10-CM | POA: Diagnosis not present

## 2021-07-05 DIAGNOSIS — M545 Low back pain, unspecified: Secondary | ICD-10-CM | POA: Diagnosis not present

## 2021-07-05 DIAGNOSIS — I6529 Occlusion and stenosis of unspecified carotid artery: Secondary | ICD-10-CM | POA: Diagnosis not present

## 2021-07-05 DIAGNOSIS — E1169 Type 2 diabetes mellitus with other specified complication: Secondary | ICD-10-CM | POA: Diagnosis not present

## 2021-07-05 DIAGNOSIS — F5104 Psychophysiologic insomnia: Secondary | ICD-10-CM | POA: Diagnosis not present

## 2021-07-05 DIAGNOSIS — E1122 Type 2 diabetes mellitus with diabetic chronic kidney disease: Secondary | ICD-10-CM | POA: Diagnosis not present

## 2021-07-05 DIAGNOSIS — G8929 Other chronic pain: Secondary | ICD-10-CM | POA: Diagnosis not present

## 2021-07-05 DIAGNOSIS — E1142 Type 2 diabetes mellitus with diabetic polyneuropathy: Secondary | ICD-10-CM | POA: Diagnosis not present

## 2021-07-05 DIAGNOSIS — E785 Hyperlipidemia, unspecified: Secondary | ICD-10-CM | POA: Diagnosis not present

## 2021-07-05 DIAGNOSIS — I5043 Acute on chronic combined systolic (congestive) and diastolic (congestive) heart failure: Secondary | ICD-10-CM | POA: Diagnosis not present

## 2021-07-05 DIAGNOSIS — D692 Other nonthrombocytopenic purpura: Secondary | ICD-10-CM | POA: Diagnosis not present

## 2021-07-05 DIAGNOSIS — J45909 Unspecified asthma, uncomplicated: Secondary | ICD-10-CM | POA: Diagnosis not present

## 2021-07-05 DIAGNOSIS — E1151 Type 2 diabetes mellitus with diabetic peripheral angiopathy without gangrene: Secondary | ICD-10-CM | POA: Diagnosis not present

## 2021-07-05 DIAGNOSIS — I130 Hypertensive heart and chronic kidney disease with heart failure and stage 1 through stage 4 chronic kidney disease, or unspecified chronic kidney disease: Secondary | ICD-10-CM | POA: Diagnosis not present

## 2021-07-05 DIAGNOSIS — I4891 Unspecified atrial fibrillation: Secondary | ICD-10-CM | POA: Diagnosis not present

## 2021-07-05 DIAGNOSIS — E1165 Type 2 diabetes mellitus with hyperglycemia: Secondary | ICD-10-CM | POA: Diagnosis not present

## 2021-07-06 ENCOUNTER — Other Ambulatory Visit: Payer: Self-pay | Admitting: Family Medicine

## 2021-07-07 ENCOUNTER — Other Ambulatory Visit: Payer: PPO

## 2021-07-08 NOTE — Chronic Care Management (AMB) (Signed)
  Chronic Care Management   Note  07/08/2021 Name: [name] [name] MRN: [id number] DOB: [birth year]  [name] [name] is a 87 y.o. year old male who is a primary care patient of Luking, Elayne Snare, MD. I reached out to [employer] by phone today in response to a referral sent by [name] [name]'s PCP, Dr. Wolfgang Phoenix      [name] [name] was given information about Chronic Care Management services today including:  CCM service includes personalized support from designated clinical staff supervised by his physician, including individualized plan of care and coordination with other care providers 24/7 contact phone numbers for assistance for urgent and routine care needs. Service will only be billed when office clinical staff spend 20 minutes or more in a month to coordinate care. Only one practitioner may furnish and bill the service in a calendar month. The patient may stop CCM services at any time (effective at the end of the month) by phone call to the office staff. The patient will be responsible for cost sharing (co-pay) of up to 20% of the service fee (after annual deductible is met).  [name] DPR on file  verbally agreed to assistance and services provided by embedded care coordination/care management team today.  Follow up plan: Telephone appointment with care management team member scheduled [facility]VC:[id number] Clinical SW on 07/22/21 and RNCM on 08/04/21  [facility]  Direct Dial: [phone]

## 2021-07-12 ENCOUNTER — Telehealth: Payer: Self-pay

## 2021-07-12 DIAGNOSIS — F0280 Dementia in other diseases classified elsewhere without behavioral disturbance: Secondary | ICD-10-CM | POA: Diagnosis not present

## 2021-07-12 DIAGNOSIS — F5104 Psychophysiologic insomnia: Secondary | ICD-10-CM | POA: Diagnosis not present

## 2021-07-12 DIAGNOSIS — E1142 Type 2 diabetes mellitus with diabetic polyneuropathy: Secondary | ICD-10-CM | POA: Diagnosis not present

## 2021-07-12 DIAGNOSIS — R270 Ataxia, unspecified: Secondary | ICD-10-CM | POA: Diagnosis not present

## 2021-07-12 DIAGNOSIS — I5043 Acute on chronic combined systolic (congestive) and diastolic (congestive) heart failure: Secondary | ICD-10-CM | POA: Diagnosis not present

## 2021-07-12 DIAGNOSIS — I951 Orthostatic hypotension: Secondary | ICD-10-CM | POA: Diagnosis not present

## 2021-07-12 DIAGNOSIS — J45909 Unspecified asthma, uncomplicated: Secondary | ICD-10-CM | POA: Diagnosis not present

## 2021-07-12 DIAGNOSIS — E1122 Type 2 diabetes mellitus with diabetic chronic kidney disease: Secondary | ICD-10-CM | POA: Diagnosis not present

## 2021-07-12 DIAGNOSIS — D631 Anemia in chronic kidney disease: Secondary | ICD-10-CM | POA: Diagnosis not present

## 2021-07-12 DIAGNOSIS — M545 Low back pain, unspecified: Secondary | ICD-10-CM | POA: Diagnosis not present

## 2021-07-12 DIAGNOSIS — E1165 Type 2 diabetes mellitus with hyperglycemia: Secondary | ICD-10-CM | POA: Diagnosis not present

## 2021-07-12 DIAGNOSIS — I4891 Unspecified atrial fibrillation: Secondary | ICD-10-CM | POA: Diagnosis not present

## 2021-07-12 DIAGNOSIS — G8929 Other chronic pain: Secondary | ICD-10-CM | POA: Diagnosis not present

## 2021-07-12 DIAGNOSIS — N401 Enlarged prostate with lower urinary tract symptoms: Secondary | ICD-10-CM | POA: Diagnosis not present

## 2021-07-12 DIAGNOSIS — E1169 Type 2 diabetes mellitus with other specified complication: Secondary | ICD-10-CM | POA: Diagnosis not present

## 2021-07-12 DIAGNOSIS — J8410 Pulmonary fibrosis, unspecified: Secondary | ICD-10-CM | POA: Diagnosis not present

## 2021-07-12 DIAGNOSIS — I6529 Occlusion and stenosis of unspecified carotid artery: Secondary | ICD-10-CM | POA: Diagnosis not present

## 2021-07-12 DIAGNOSIS — N183 Chronic kidney disease, stage 3 unspecified: Secondary | ICD-10-CM | POA: Diagnosis not present

## 2021-07-12 DIAGNOSIS — N138 Other obstructive and reflux uropathy: Secondary | ICD-10-CM | POA: Diagnosis not present

## 2021-07-12 DIAGNOSIS — G301 Alzheimer's disease with late onset: Secondary | ICD-10-CM | POA: Diagnosis not present

## 2021-07-12 DIAGNOSIS — I130 Hypertensive heart and chronic kidney disease with heart failure and stage 1 through stage 4 chronic kidney disease, or unspecified chronic kidney disease: Secondary | ICD-10-CM | POA: Diagnosis not present

## 2021-07-12 DIAGNOSIS — D692 Other nonthrombocytopenic purpura: Secondary | ICD-10-CM | POA: Diagnosis not present

## 2021-07-12 DIAGNOSIS — I25119 Atherosclerotic heart disease of native coronary artery with unspecified angina pectoris: Secondary | ICD-10-CM | POA: Diagnosis not present

## 2021-07-12 DIAGNOSIS — E1151 Type 2 diabetes mellitus with diabetic peripheral angiopathy without gangrene: Secondary | ICD-10-CM | POA: Diagnosis not present

## 2021-07-12 DIAGNOSIS — E785 Hyperlipidemia, unspecified: Secondary | ICD-10-CM | POA: Diagnosis not present

## 2021-07-12 NOTE — Telephone Encounter (Signed)
Very nice patient yet very complex(patient with underlying dementia as well as history of heart disease and diabetes)  If his shortness of breath is getting dramatically worse we can see him in the office this week or even as a car visit if that is possible Consideration for home COVID test would be a good idea As for the frequency of urination that is hard to know if that is a sign of underlying issues or not Would be beneficial to know how his sugars are running If they are severely elevated that can cause increased urination Also if they are able to collect a specimen with a clean-catch cup that can be submitted as well

## 2021-07-12 NOTE — Telephone Encounter (Signed)
Spoke with Richardson Landry and informed pf drs recommendations, he verbalizes understaning. Appt offered for tomorrow morning for evaluation.

## 2021-07-12 NOTE — Telephone Encounter (Signed)
Patient's family member, Richardson Landry calls to inform per patient in home Physical Therapist,  patient has slight cough, short of breath when he walks, for a couple of days. Going to bathroom frequently, please advise

## 2021-07-13 ENCOUNTER — Ambulatory Visit (INDEPENDENT_AMBULATORY_CARE_PROVIDER_SITE_OTHER): Payer: PPO | Admitting: Family Medicine

## 2021-07-13 ENCOUNTER — Encounter (HOSPITAL_COMMUNITY): Payer: Self-pay

## 2021-07-13 ENCOUNTER — Emergency Department (HOSPITAL_COMMUNITY)
Admission: EM | Admit: 2021-07-13 | Discharge: 2021-07-13 | Disposition: A | Discharge status: Home / Self Care | Payer: PPO | Attending: Emergency Medicine | Admitting: Emergency Medicine

## 2021-07-13 ENCOUNTER — Emergency Department (HOSPITAL_COMMUNITY): Payer: PPO

## 2021-07-13 ENCOUNTER — Other Ambulatory Visit: Payer: Self-pay

## 2021-07-13 DIAGNOSIS — R0602 Shortness of breath: Secondary | ICD-10-CM | POA: Insufficient documentation

## 2021-07-13 DIAGNOSIS — I130 Hypertensive heart and chronic kidney disease with heart failure and stage 1 through stage 4 chronic kidney disease, or unspecified chronic kidney disease: Secondary | ICD-10-CM | POA: Insufficient documentation

## 2021-07-13 DIAGNOSIS — Z794 Long term (current) use of insulin: Secondary | ICD-10-CM

## 2021-07-13 DIAGNOSIS — Z87891 Personal history of nicotine dependence: Secondary | ICD-10-CM | POA: Insufficient documentation

## 2021-07-13 DIAGNOSIS — R300 Dysuria: Secondary | ICD-10-CM

## 2021-07-13 DIAGNOSIS — I2510 Atherosclerotic heart disease of native coronary artery without angina pectoris: Secondary | ICD-10-CM | POA: Insufficient documentation

## 2021-07-13 DIAGNOSIS — J45901 Unspecified asthma with (acute) exacerbation: Secondary | ICD-10-CM | POA: Diagnosis not present

## 2021-07-13 DIAGNOSIS — N1832 Chronic kidney disease, stage 3b: Secondary | ICD-10-CM | POA: Insufficient documentation

## 2021-07-13 DIAGNOSIS — Z79899 Other long term (current) drug therapy: Secondary | ICD-10-CM | POA: Insufficient documentation

## 2021-07-13 DIAGNOSIS — R3912 Poor urinary stream: Secondary | ICD-10-CM | POA: Diagnosis not present

## 2021-07-13 DIAGNOSIS — F0280 Dementia in other diseases classified elsewhere without behavioral disturbance: Secondary | ICD-10-CM | POA: Diagnosis not present

## 2021-07-13 DIAGNOSIS — R0600 Dyspnea, unspecified: Secondary | ICD-10-CM

## 2021-07-13 DIAGNOSIS — F0390 Unspecified dementia without behavioral disturbance: Secondary | ICD-10-CM | POA: Diagnosis not present

## 2021-07-13 DIAGNOSIS — E1122 Type 2 diabetes mellitus with diabetic chronic kidney disease: Secondary | ICD-10-CM

## 2021-07-13 DIAGNOSIS — I5043 Acute on chronic combined systolic (congestive) and diastolic (congestive) heart failure: Secondary | ICD-10-CM | POA: Insufficient documentation

## 2021-07-13 DIAGNOSIS — Z7984 Long term (current) use of oral hypoglycemic drugs: Secondary | ICD-10-CM | POA: Diagnosis not present

## 2021-07-13 DIAGNOSIS — I509 Heart failure, unspecified: Secondary | ICD-10-CM | POA: Diagnosis not present

## 2021-07-13 DIAGNOSIS — I5022 Chronic systolic (congestive) heart failure: Secondary | ICD-10-CM

## 2021-07-13 DIAGNOSIS — E860 Dehydration: Secondary | ICD-10-CM | POA: Insufficient documentation

## 2021-07-13 DIAGNOSIS — G309 Alzheimer's disease, unspecified: Secondary | ICD-10-CM | POA: Diagnosis not present

## 2021-07-13 DIAGNOSIS — Z8673 Personal history of transient ischemic attack (TIA), and cerebral infarction without residual deficits: Secondary | ICD-10-CM | POA: Diagnosis not present

## 2021-07-13 DIAGNOSIS — R0609 Other forms of dyspnea: Secondary | ICD-10-CM

## 2021-07-13 DIAGNOSIS — R339 Retention of urine, unspecified: Secondary | ICD-10-CM | POA: Diagnosis not present

## 2021-07-13 DIAGNOSIS — I517 Cardiomegaly: Secondary | ICD-10-CM | POA: Diagnosis not present

## 2021-07-13 LAB — CBC WITH DIFFERENTIAL/PLATELET
Abs Immature Granulocytes: 0.06 10*3/uL (ref 0.00–0.07)
Basophils Absolute: 0 10*3/uL (ref 0.0–0.1)
Basophils Relative: 0 %
Eosinophils Absolute: 0.1 10*3/uL (ref 0.0–0.5)
Eosinophils Relative: 2 %
HCT: 41.9 % (ref 39.0–52.0)
Hemoglobin: 14.4 g/dL (ref 13.0–17.0)
Immature Granulocytes: 1 %
Lymphocytes Relative: 12 %
Lymphs Abs: 0.9 10*3/uL (ref 0.7–4.0)
MCH: 30.9 pg (ref 26.0–34.0)
MCHC: 34.4 g/dL (ref 30.0–36.0)
MCV: 89.9 fL (ref 80.0–100.0)
Monocytes Absolute: 0.6 10*3/uL (ref 0.1–1.0)
Monocytes Relative: 8 %
Neutro Abs: 5.8 10*3/uL (ref 1.7–7.7)
Neutrophils Relative %: 77 %
Platelets: 172 10*3/uL (ref 150–400)
RBC: 4.66 MIL/uL (ref 4.22–5.81)
RDW: 13.5 % (ref 11.5–15.5)
WBC: 7.5 10*3/uL (ref 4.0–10.5)
nRBC: 0 % (ref 0.0–0.2)

## 2021-07-13 LAB — POCT URINALYSIS DIPSTICK
Spec Grav, UA: 1.015 (ref 1.010–1.025)
pH, UA: 6 (ref 5.0–8.0)

## 2021-07-13 LAB — COMPREHENSIVE METABOLIC PANEL
ALT: 27 U/L (ref 0–44)
AST: 23 U/L (ref 15–41)
Albumin: 3.7 g/dL (ref 3.5–5.0)
Alkaline Phosphatase: 86 U/L (ref 38–126)
Anion gap: 8 (ref 5–15)
BUN: 23 mg/dL (ref 8–23)
CO2: 25 mmol/L (ref 22–32)
Calcium: 9.3 mg/dL (ref 8.9–10.3)
Chloride: 101 mmol/L (ref 98–111)
Creatinine, Ser: 1.18 mg/dL (ref 0.61–1.24)
GFR, Estimated: 60 mL/min — ABNORMAL LOW (ref >60)
Glucose, Bld: 231 mg/dL — ABNORMAL HIGH (ref 70–99)
Potassium: 4.3 mmol/L (ref 3.5–5.1)
Sodium: 134 mmol/L — ABNORMAL LOW (ref 135–145)
Total Bilirubin: 0.6 mg/dL (ref 0.3–1.2)
Total Protein: 7.1 g/dL (ref 6.5–8.1)

## 2021-07-13 LAB — URINALYSIS, ROUTINE W REFLEX MICROSCOPIC
Bilirubin Urine: NEGATIVE
Glucose, UA: 50 mg/dL — AB
Hgb urine dipstick: NEGATIVE
Ketones, ur: 5 mg/dL — AB
Leukocytes,Ua: NEGATIVE
Nitrite: NEGATIVE
Protein, ur: NEGATIVE mg/dL
Specific Gravity, Urine: 1.021 (ref 1.005–1.030)
pH: 5 (ref 5.0–8.0)

## 2021-07-13 LAB — BRAIN NATRIURETIC PEPTIDE: B Natriuretic Peptide: 82 pg/mL (ref 0.0–100.0)

## 2021-07-13 MED ORDER — SODIUM CHLORIDE 0.9 % IV BOLUS
1000.0000 mL | Freq: Once | INTRAVENOUS | Status: AC
  Administered 2021-07-13: 1000 mL via INTRAVENOUS

## 2021-07-13 MED ORDER — TAMSULOSIN HCL 0.4 MG PO CAPS: 0.4000 mg | ORAL_CAPSULE | Freq: Every day | ORAL | 0 refills | Status: DC

## 2021-07-13 NOTE — ED Triage Notes (Addendum)
Pt with dementia, family dropped off, but unable to stay with pt.  Awaiting another caregiver to explain reason for visit.  Was sent by pmd. For difficulty with urination

## 2021-07-13 NOTE — ED Notes (Signed)
Information from caregiver

## 2021-07-13 NOTE — ED Notes (Signed)
Bladder scan post IV fluids 157m

## 2021-07-13 NOTE — ED Notes (Signed)
Dr Regenia Skeeter updated caregiver on results.  Call placed to family for ride home

## 2021-07-13 NOTE — ED Provider Notes (Signed)
[facility] EMERGENCY DEPARTMENT Provider Note   CSN: [id number] Arrival date & time: 07/13/21  1148     History Chief Complaint  Patient presents with   Weakness    Hieu Rieken Herda is a 87 y.o. male.  HPI Patient presents at the behest of his physician due to concern for difficulty with urination.  The patient self has dementia, level 5 caveat. He is unsure why he is here, but seems to answer questions about his current status appropriately, specifically.  He denies pain, dyspnea, confusion.  Additional details on chart review from visit earlier today with assessment and plan as below: 1. Dysuria Has had difficulty producing urine today's urine actually looks good it was collected at home - POCT Urinalysis Dipstick   2. DOE (dyspnea on exertion) Shortness of breath with activity has underlying heart disease worsening shortness of breath with weakness and fatigue.  Patient becoming deconditioned.  Very difficult to tell if he is entering into pulmonary edema without running some additional test   3. Weak urine stream Over the past week has had very difficult time urinating raises into question urinary retention patient is a poor historian.  May well benefit from ultrasound to make sure he does not have bladder outlet obstruction from the prostate   4. Chronic systolic heart failure (HCC) Seemingly stable currently I do not detect florid pulmonary edema but would benefit from additional blood work   Unable to get O2 saturation on patient here    5. Type 2 diabetes mellitus with stage 3b chronic kidney disease, with long-term current use of insulin (HCC) Glucose readings look stable   6. Dementia without behavioral disturbance, unspecified dementia type ([place]) Severe dementia that is getting worse Although patient would benefit from further evaluation from the ER I believe patient is heading into terminal phase of his dementia over the course of the next several months.  After ER  visit we will discuss with family about potential for hospice care versus palliative   Triage was spoken with family taking patient to the ER because of the shortness of breath and potential urinary retention     Past Medical History:  Diagnosis Date   Acute on chronic systolic CHF (congestive heart failure) ([place]) 02/09/2017   Acute respiratory failure with hypoxia ([place])    AKI (acute kidney injury) ([place]) 08/20/2015   Altered mental status 02/21/2017   Aortic stenosis    a. mod-sev by echo 05/2016.   Asthma    Asthma exacerbation 08/20/2015   Atrial fibrillation (HCC)    Paroxysmal; Echo in 2007-normal EF; mild LVH; LA enlargement; mild AS, minimal AI; neg. stress nuclear study in 2008    BPH (benign prostatic hypertrophy) with urinary obstruction 09/27/2011   Transurethral resection of the prostate in 09/2011    CAD (coronary artery disease), native coronary artery 06/28/2016   Cardiogenic shock ([place])    Carotid stenosis 04/25/2012   Carotid US 9/21: Bilateral ICA 1-39   Cerebrovascular disease 07/2010   TIA; carotid ultrasound in 07/2010-significant bilateral plaque without focal internal carotid artery stenosis; MRI -encephalomalacia left temporal and right temporal lobes; small inferior right cerebellar infarct; small vessel disease   Chronic atrial fibrillation (HCC)    Paroxysmal; Echocardiogram in 2007-normal EF; mild LVH; left atrial enlargement; mild stenosis and minimal AI; negative stress nuclear study in [id number]   Chronic systolic CHF (congestive heart failure) ([place])    a. dx 05/2016 - EF 35-40%, diffuse HK, mod-severe AS, mod gradient, severe AVA VTI  likely due to decreased cardiac output in setting of systolic dysfsunction and significant mitral regurgitation, mild MR, mod-severe MR, severe LAE, mild-mod RV dilation, mild RAE, mild-mod TR, mod PASP 70mHg.   CKD (chronic kidney disease), stage II    Cognitive dysfunction 05/29/2015   I believe that this is related to his age as well  as previous stroke    Congestive heart failure (CHF) ([place] 02/09/2017   Degenerative joint disease    feet and legs   Diabetes mellitus    no insulin; A1c of 6.6 in 2005   Dizziness    occurs daily,especially in am   Elevated troponin    Exertional dyspnea    Gastroesophageal reflux disease    Hepatic steatosis    History of noncompliance with medical treatment    Hyperlipidemia    adverse reactions to statins and niacin   Hypertension    Borderline   Irregular heartbeat    Mitral regurgitation    a. mod-sev by echo 05/2016   Peripheral vascular disease ([place]    Renal insufficiency    S/P TAVR (transcatheter aortic valve replacement) 07/19/2016   29 mm Edwards Sapien 3 transcatheter heart valve placed via left percutaneous transfemoral approach   Senile purpura ([place] 08/15/2018   Temporal arteritis (HCC)    Tricuspid regurgitation    a. mild-mod TR by echo 05/2016   VT (ventricular tachycardia) ([facility]     Patient Active Problem List   Diagnosis Date Noted   Pulmonary fibrosis ([facility] 12/25/2020   Peripheral vascular disease, unspecified ([place] 10/21/2020   Late onset Alzheimer's disease with behavioral disturbance (HB[place]art 06/30/2020   CKD stage 3 due to type 2 diabetes mellitus (HFunk[place] 05/15/2020   Diabetic peripheral neuropathy associated with type 2 diabetes mellitus (HS[place] 05/15/2020   Hyperlipidemia associated with type 2 diabetes mellitus ([place] 05/15/2020   Dementia without behavioral disturbance ([place] 05/15/2020   Hypokalemia 05/15/2020   Chronic insomnia 05/15/2020   Acute on chronic HFrEF (heart failure with reduced ejection fraction) ([place] 05/11/2020   Acute on chronic systolic heart failure, NYHA class 3 (HGar[place] 05/11/2020   Senile purpura (HBraden[place] 08/15/2018   Renal insufficiency 03/14/2018   Altered mental status [id number]  Chronic systolic heart failure (HW[place] 02/09/2017   Controlled type 2 diabetes mellitus with hyperglycemia (HJohnson 02/09/2017   Elevated troponin    S/P  TAVR (transcatheter aortic valve replacement) 07/19/2016   Mitral regurgitation    VT (ventricular tachycardia) (HCC)    Shortness of breath 06/28/2016   Coronary artery disease involving native coronary artery of native heart with angina pectoris (HCC)    Acute on chronic combined systolic and diastolic CHF, NYHA class 4 (HCC)    Anemia of chronic disease 04/12/2016   Orthostatic hypotension 08/20/2015   AKI (acute kidney injury) (H[place] 08/20/2015   Chronic asthma 08/20/2015   Cognitive dysfunction 05/29/2015   Chronic back pain 07/29/2014   Lumbar pain 06/23/2014   Chronic pain of right ankle 05/30/2014   Diabetic peripheral neuropathy (HGen[place] 05/30/2014   Chest congestion 05/02/2013   Wheezing 05/02/2013   Chronic constipation 04/30/2013   Leg edema, left 04/27/2013   Current use of long term anticoagulation 02/16/2013   Carotid stenosis 04/25/2012   BPH (benign prostatic hypertrophy) with urinary obstruction 09/27/2011   Dyspnea on exertion 08/01/2011   DM (diabetes mellitus), type 2 with renal complications (HPine Beach [id number]  Hypertension    Atrial fibrillation (HGl[place]    Hyperlipidemia    History of noncompliance with medical treatment  Aortic stenosis    Cerebrovascular disease 07/15/2010    Past Surgical History:  Procedure Laterality Date   COLONOSCOPY  2002   COLONOSCOPY  01/19/2012   Procedure: COLONOSCOPY;  Surgeon: Rogene Houston, MD;  [facility];  Service: Endoscopy;  Laterality: N/A;  [place]   Right   PERIPHERAL VASCULAR CATHETERIZATION N/A 07/11/2016   Procedure: Carotid PTA/Stent Intervention;  Surgeon: Lorretta Harp, MD;  Location: [place] CV LAB;  Service: Cardiovascular;  Laterality: N/A;   PROSTATE SURGERY  12/2011   RIGHT HEART CATH AND CORONARY ANGIOGRAPHY N/A 05/11/2020   Procedure: RIGHT HEART CATH AND CORONARY ANGIOGRAPHY;  Surgeon: Nelva Bush, MD;  Location: [place] CV LAB;   Service: Cardiovascular;  Laterality: N/A;   ROTATOR CUFF REPAIR     Right   TEE WITHOUT CARDIOVERSION N/A 06/17/2016   Procedure: TRANSESOPHAGEAL ECHOCARDIOGRAM (TEE);  Surgeon: Jerline Pain, MD;  Location: [facility] ENDOSCOPY;  Service: Cardiovascular;  Laterality: N/A;   TEE WITHOUT CARDIOVERSION N/A 07/19/2016   Procedure: TRANSESOPHAGEAL ECHOCARDIOGRAM (TEE);  Surgeon: Sherren Mocha, MD;  Location: [place];  Service: Open Heart Surgery;  Laterality: N/A;   TRANSCATHETER AORTIC VALVE REPLACEMENT, TRANSFEMORAL N/A 07/19/2016   Procedure: TRANSCATHETER AORTIC VALVE REPLACEMENT, TRANSFEMORAL;  Surgeon: Sherren Mocha, MD;  Location: [place];  Service: Open Heart Surgery;  Laterality: N/A;   TRANSURETHRAL RESECTION OF PROSTATE  09/2011   URETHRAL STRICTURE DILATATION  1980s       Family History  Problem Relation Age of Onset   Stroke Mother    Diabetes Father    Colon cancer Neg Hx     Social History   Tobacco Use   Smoking status: Former    Packs/day: 1.00    Years: 20.00    Pack years: 20.00    Types: Cigarettes    Start date: 07/04/1950    Quit date: 04/25/1992    Years since quitting: 29.2   Smokeless tobacco: Current    Types: Chew  Substance Use Topics   Alcohol use: No    Alcohol/week: 0.0 standard drinks   Drug use: No    Home Medications Prior to Admission medications   Medication Sig Start Date End Date Taking? Authorizing Provider  ALPRAZolam (XANAX) 0.25 MG tablet TAKE (1) TABLET BY MOUTH THREE TIMES DAILY AS NEEDED FOR ANXIETY. 01/05/21  Yes Luking, Scott A, MD  bisoprolol (ZEBETA) 5 MG tablet TAKE 0.5 TABLET (2.'5MG'$  TOTAL) BY MOUTH ONCE DAILY. 07/06/21  Yes Kathyrn Drown, MD  cholecalciferol (VITAMIN D3) 25 MCG (1000 UNIT) tablet Take 1,000 Units by mouth in the morning and at bedtime.   Yes [provider]  digoxin (LANOXIN) 0.125 MG tablet TAKE (1/2) TABLET BY MOUTH ONCE DAILY. 02/17/21  Yes Bensimhon, Shaune Pascal, MD  furosemide (LASIX) 20 MG tablet Take 1 tablet  (20 mg total) by mouth 3 (three) times a week. Every Mon, Wed, and Fri, can take on other days as needed 01/08/21  Yes Bensimhon, Shaune Pascal, MD  isosorbide mononitrate (IMDUR) 30 MG 24 hr tablet Take 15 mg by mouth daily. 05/26/20  Yes [provider]  LANTUS SOLOSTAR 100 UNIT/ML Solostar Pen INJECT 34 UNITS EVERY EVENING. 01/20/21  Yes Kathyrn Drown, MD  metFORMIN (GLUCOPHAGE) 500 MG tablet Take 1/2 tablet po twice a day 04/26/21  Yes Luking, Elayne Snare, MD  Methylcobalamin (B12-ACTIVE PO) Take by mouth.   Yes [provider]  nortriptyline (PAMELOR) 10 MG capsule TAKE 1 CAPSULES AT BEDTIME FOR BURNING IN FEET.[28 IN PACKS/ NONE IN BOTTLE] 12/22/20  Yes Kathyrn Drown, MD  OLANZapine (ZYPREXA) 2.5 MG tablet One tablet twice a day 05/26/21  Yes Luking, Scott A, MD  psyllium (METAMUCIL) 58.6 % powder Take 1 packet by mouth daily.   Yes [provider]  rosuvastatin (CRESTOR) 5 MG tablet TAKE (1) TABLET BY MOUTH ONCE DAILY AT BEDTIME. 02/15/21  Yes Luking, Elayne Snare, MD  spironolactone (ALDACTONE) 25 MG tablet TAKE 1/2 TABLET BY MOUTH ONCE DAILY. 07/06/21  Yes Bensimhon, Shaune Pascal, MD  TRUEPLUS PEN NEEDLES 31G X 8 MM MISC USE WITH LANTUS SOLOSTAR EVERY EVENING 01/04/21  Yes Kathyrn Drown, MD    Allergies    Ambien [zolpidem tartrate], Lipitor [atorvastatin calcium], Ranitidine, Simvastatin, Xanax xr [alprazolam er], Cholestatin, and Clopidogrel bisulfate  Review of Systems   Review of Systems  Unable to perform ROS: Dementia   Physical Exam Updated Vital Signs BP (!) 152/58   Pulse 65   Temp 97.8 F (36.6 C) (Oral)   Resp 17   SpO2 98%   Physical Exam Vitals and nursing note reviewed.  Constitutional:      General: He is not in acute distress.    Appearance: He is well-developed.  HENT:     Head: Normocephalic and atraumatic.  Eyes:     Conjunctiva/sclera: Conjunctivae normal.  Cardiovascular:     Rate and Rhythm: Normal rate and regular rhythm.  Pulmonary:      Effort: Pulmonary effort is normal. No respiratory distress.     Breath sounds: No stridor.  Abdominal:     General: There is no distension.  Skin:    General: Skin is warm and dry.  Neurological:     Mental Status: He is alert.     Motor: Atrophy present.  Psychiatric:        Cognition and Memory: Cognition is impaired. Memory is impaired.    ED Results / Procedures / Treatments   Labs (all labs ordered are listed, but only abnormal results are displayed) Labs Reviewed  COMPREHENSIVE METABOLIC PANEL - Abnormal; Notable for the following components:      Result Value   Sodium 134 (*)    Glucose, Bld 231 (*)    GFR, Estimated 60 (*)    All other components within normal limits  URINALYSIS, ROUTINE W REFLEX MICROSCOPIC - Abnormal; Notable for the following components:   Glucose, UA 50 (*)    Ketones, ur 5 (*)    All other components within normal limits  CBC WITH DIFFERENTIAL/PLATELET  BRAIN NATRIURETIC PEPTIDE    EKG None  Radiology DG Chest 2 View  Result Date: 07/13/2021 CLINICAL DATA:  poss CHF exacerbation? EXAM: CHEST - 2 VIEW COMPARISON:  August 2021 FINDINGS: The heart is borderline enlarged. No pleural effusion. No pneumothorax. Coarse interstitial markings are similar. No new consolidation. Stable appearance of endovascular valve prosthesis. IMPRESSION: Borderline cardiomegaly without definite pulmonary edema. No pleural effusion. Electronically Signed   By: Albin Felling M.D.   On: 07/13/2021 14:05    Procedures Procedures   Medications Ordered in ED Medications  sodium chloride 0.9 % bolus 1,000 mL (has no administration in time range)    ED Course  I have reviewed the triage vital signs and the nursing notes.  Pertinent labs & imaging results that were available during my care of the patient were reviewed by me and considered in my medical decision making (  see chart for details).   3:45 PM On repeat exam patient is in no distress, no complaints.  I  have discussed the patient's results with his niece via telephone.  Absent complaints of abdominal pain, flank pain, no hematuria, low suspicion for occult stone.  Some suspicion for dehydration contributing to the patient's difficulty with generating a stream.  X-ray reviewed, labs reviewed, no evidence for pulmonary congestion, decompensated heart failure, no hemodynamic stability, no evidence for atypical ACS, bacteremia, sepsis.  Patient will follow up with primary care next week after fluid resuscitation here.   Final Clinical Impression(s) / ED Diagnoses Final diagnoses:  Dehydration     Carmin Muskrat, MD 07/13/21 1550

## 2021-07-13 NOTE — Progress Notes (Signed)
   Subjective:    Patient ID: [name] [name], male    DOB: January 20, 1932, 87 y.o.   MRN: [id number] Car visit at the office Patient too weak to get out of the car HPI Pt having shortness of breath for about one week. Also having issues with urination. Having a hard time going;grunts and strains. Therapy stated they think his kidneys may not be working well. Possible fluid retention.   Results for orders placed or performed in visit on 07/13/21  POCT Urinalysis Dipstick  Result Value Ref Range   Color, UA     Clarity, UA     Glucose, UA     Bilirubin, UA     Ketones, UA     Spec Grav, UA 1.015 1.010 - 1.025   Blood, UA     pH, UA 6.0 5.0 - 8.0   Protein, UA     Urobilinogen, UA     Nitrite, UA     Leukocytes, UA     Appearance     Odor      Significant shortness of breath over the past week gets out of breath with minimal activity.  Does have some underlying history of heart disease  Also according to family has had very difficult time getting urine they were able to get a small specimen earlier today which looks good no obvious sign of infection but apparently when he goes to urinate he feels like he needs to but he can only dribble a small amount out  Patient does have mild to moderate dementia so therefore he is not a good historian  Underlying diabetes as well Recently cardiology stopped warfarin because of increased weakness and falls Review of Systems     Objective:   Physical Exam Atrial fibrillation detected rate is controlled Lungs some congestion noted in the left base but no true crackles Extremities trace edema skin warm dry abdomen soft  Unable to get O2 sat blood pressure 108/68     Assessment & Plan:  1. Dysuria Has had difficulty producing urine today's urine actually looks good it was collected at home - POCT Urinalysis Dipstick  2. DOE (dyspnea on exertion) Shortness of breath with activity has underlying heart disease worsening shortness of breath  with weakness and fatigue.  Patient becoming deconditioned.  Very difficult to tell if he is entering into pulmonary edema without running some additional test  3. Weak urine stream Over the past week has had very difficult time urinating raises into question urinary retention patient is a poor historian.  May well benefit from ultrasound to make sure he does not have bladder outlet obstruction from the prostate  4. Chronic systolic heart failure (HCC) Seemingly stable currently I do not detect florid pulmonary edema but would benefit from additional blood work  Unable to get O2 saturation on patient here   5. Type 2 diabetes mellitus with stage 3b chronic kidney disease, with long-term current use of insulin (HCC) Glucose readings look stable  6. Dementia without behavioral disturbance, unspecified dementia type ([name] [name]) Severe dementia that is getting worse Although patient would benefit from further evaluation from the ER I believe patient is heading into terminal phase of his dementia over the course of the next several months.  After ER visit we will discuss with family about potential for hospice care versus palliative  Triage was spoken with family taking patient to the ER because of the shortness of breath and potential urinary retention

## 2021-07-13 NOTE — ED Provider Notes (Signed)
I was asked to see patient at the caregivers request.  They are concerned because the urinary symptoms do not seem to have been addressed.  It seems like he is having a lot of difficulty starting and urinating.  Here he has 172 mL in his bladder.  He he was able to urinate earlier.  Given this, he does not meet criteria for a Foley but I think it is reasonable to start him on tamsulosin.   Sherwood Gambler, MD 07/13/21 1754

## 2021-07-13 NOTE — Addendum Note (Signed)
Addended by: Vicente Males on: 07/13/2021 11:45 AM   Modules accepted: Orders

## 2021-07-13 NOTE — ED Notes (Signed)
Pt bladder scanned 125m, no suprapubic tenderness or distention, attempt to get up to bedside commode to provide urine specimen

## 2021-07-13 NOTE — ED Notes (Signed)
Caregiver at bedside

## 2021-07-13 NOTE — Discharge Instructions (Addendum)
As discussed, your evaluation today has been largely reassuring.  But, it is important that you monitor your condition carefully, and do not hesitate to return to the ED if you develop new, or concerning changes in your condition. ? ?Otherwise, please follow-up with your physician for appropriate ongoing care. ? ?

## 2021-07-14 DIAGNOSIS — E119 Type 2 diabetes mellitus without complications: Secondary | ICD-10-CM | POA: Diagnosis not present

## 2021-07-15 LAB — URINE CULTURE

## 2021-07-20 ENCOUNTER — Other Ambulatory Visit: Payer: Self-pay

## 2021-07-20 ENCOUNTER — Ambulatory Visit (INDEPENDENT_AMBULATORY_CARE_PROVIDER_SITE_OTHER): Payer: PPO | Admitting: Family Medicine

## 2021-07-20 VITALS — BP 106/63 | HR 80 | Ht 73.0 in

## 2021-07-20 DIAGNOSIS — R270 Ataxia, unspecified: Secondary | ICD-10-CM

## 2021-07-20 DIAGNOSIS — R531 Weakness: Secondary | ICD-10-CM

## 2021-07-20 NOTE — Progress Notes (Signed)
   Subjective:    Patient ID: [name] [name], male    DOB: 1932-05-30, 87 y.o.   MRN: [id number]  HPI  Patient arrives for a follow up from a recent ER visit for Dehydration and difficulty with urine stream. ER also stated patient's dementia is worsening.  Review of Systems     Objective:   Physical Exam Lungs clear heart irregular but rate controlled extremities with minimal edema skin warm dry       Assessment & Plan:   End-stage care Palliative approach would be best We will try to minimize putting him through any type of extraordinary measures He would benefit from physical therapy we will try to get this approved for him He is at risk for falls He is homebound Recheck again in approximately 1 month if he is unable to come here I will go to his house

## 2021-07-22 ENCOUNTER — Ambulatory Visit (INDEPENDENT_AMBULATORY_CARE_PROVIDER_SITE_OTHER): Payer: PPO | Admitting: *Deleted

## 2021-07-22 DIAGNOSIS — N1832 Chronic kidney disease, stage 3b: Secondary | ICD-10-CM

## 2021-07-22 DIAGNOSIS — R3912 Poor urinary stream: Secondary | ICD-10-CM

## 2021-07-22 DIAGNOSIS — F09 Unspecified mental disorder due to known physiological condition: Secondary | ICD-10-CM

## 2021-07-22 DIAGNOSIS — R269 Unspecified abnormalities of gait and mobility: Secondary | ICD-10-CM

## 2021-07-22 DIAGNOSIS — E1122 Type 2 diabetes mellitus with diabetic chronic kidney disease: Secondary | ICD-10-CM

## 2021-07-22 DIAGNOSIS — F02818 Dementia in other diseases classified elsewhere, unspecified severity, with other behavioral disturbance: Secondary | ICD-10-CM

## 2021-07-22 DIAGNOSIS — F0281 Dementia in other diseases classified elsewhere with behavioral disturbance: Secondary | ICD-10-CM

## 2021-07-22 NOTE — Patient Instructions (Signed)
Visit Information   PATIENT GOALS:   Goals Addressed             This Visit's Progress    Find Help in My Community       Timeframe:  Short-Term Goal Priority:  High Start Date:    07/22/21                         Expected End Date:             08/23/21          Follow Up Date 08/05/21    - begin a notebook of services in my neighborhood or community - call 211 when I need some help - follow-up on any referrals for help I am given - think ahead to make sure my need does not become an emergency - make a note about what I need to have by the phone or take with me, like an identification card or social security number have a back-up plan - have a back-up plan - make a list of family or friends that I can call    Why is this important?   Knowing how and where to find help for yourself or family in your neighborhood and community is an important skill.  You will want to take some steps to learn how.    Notes:         Consent to CCM Services: [name] [name] was given information about Chronic Care Management services including:  CCM service includes personalized support from designated clinical staff supervised by his physician, including individualized plan of care and coordination with other care providers 24/7 contact phone numbers for assistance for urgent and routine care needs. Service will only be billed when office clinical staff spend 20 minutes or more in a month to coordinate care. Only one practitioner may furnish and bill the service in a calendar month. The patient may stop CCM services at any time (effective at the end of the month) by phone call to the office staff. The patient will be responsible for cost sharing (co-pay) of up to 20% of the service fee (after annual deductible is met).  Patient agreed to services and verbal consent obtained.   The patient verbalized understanding of instructions, educational materials, and care plan provided today and declined  offer to receive copy of patient instructions, educational materials, and care plan.   Telephone follow up appointment with care management team member scheduled for:08/05/21  Eduard Clos MSW, LCSW Licensed Clinical Social Worker [facility] [phone]   CLINICAL CARE PLAN: Patient Care Plan: LCSW Plan of Care     Problem Identified: Mobility and Independence decline   Priority: High     Goal: Provide resources and support to aide in pt's care where his Mobility and Independence are declining   Start Date: 07/22/2021  Expected End Date: 08/23/2021  This Visit's Progress: On track  Priority: High  Note:   Current barriers:    ADL IADL limitations, Cognitive Deficits, Memory Deficits, Inability to perform ADL's independently, and Lacks knowledge of community resource:   Clinical Goals: Patient will work with CSW to address needs related to in-home support/resources Clinical Interventions:  1:1 collaboration with primary care provider regarding development and update of comprehensive plan of care as evidenced by provider attestation and co-signature Inter-disciplinary care team collaboration (see longitudinal plan of care) Assessment of needs, barriers , agencies contacted, as well as how impacting Review various  resources, discussed options and provided patient information about  Dementia resources and support  Home health request to resume/comtinue Supplies for home use Caregiver stress acknowledged , Increase in actives / exercise encouraged , and Collaborated with PCP Patient Goals/Self-Care Activities: Over the next 20 days Look for email regarding ordering of supplies/cost I will coordinate with PCP to assist with resuming home health physical therapy  begin a notebook of services in my neighborhood or community - call 211 when I need some help - follow-up on any referrals for help I am given - think ahead to make sure my need does not become an  emergency - make a note about what I need to have by the phone or take with me, like an identification card or social security number have a back-up plan - have a back-up plan - make a list of family or friends that I can call

## 2021-07-22 NOTE — Chronic Care Management (AMB) (Signed)
Chronic Care Management    Clinical Social Work Note  07/22/2021 Name: [name] [name] MRN: [id number] DOB: 08-Jun-1932  [place] Lanius Zehnder is a 87 y.o. year old male who is a primary care patient of Luking, Elayne Snare, MD. The CCM team was consulted to assist the patient with chronic disease management and/or care coordination needs related to: [employer]  and Caregiver Stress.   Engaged with patient by telephone for initial visit in response to provider referral for social work chronic care management and care coordination services.   Consent to Services:  The patient was given information about Chronic Care Management services, agreed to services, and gave verbal consent prior to initiation of services.  Please see initial visit note for detailed documentation.   Patient agreed to services and consent obtained.   Assessment: Review of patient past medical history, allergies, medications, and health status, including review of relevant consultants reports was performed today as part of a comprehensive evaluation and provision of chronic care management and care coordination services.     SDOH (Social Determinants of Health) assessments and interventions performed:    Advanced Directives Status: Not addressed in this encounter.  CCM Care Plan  Allergies  Allergen Reactions   Ambien [Zolpidem Tartrate] Other (See Comments)    Sleep walks   Lipitor [Atorvastatin Calcium] Other (See Comments)    myalgias   Ranitidine Other (See Comments)    Chest discomfort   Simvastatin Other (See Comments)    Myalgias   Xanax Xr [Alprazolam Er] Other (See Comments)    Tightness in chest   Cholestatin Other (See Comments)    UNSPECIFIED REACTION    Clopidogrel Bisulfate Rash    Outpatient Encounter Medications as of 07/22/2021  Medication Sig Note   ALPRAZolam (XANAX) 0.25 MG tablet TAKE (1) TABLET BY MOUTH THREE TIMES DAILY AS NEEDED FOR ANXIETY.    bisoprolol (ZEBETA) 5 MG tablet TAKE 0.5  TABLET (2.'5MG'$  TOTAL) BY MOUTH ONCE DAILY.    cholecalciferol (VITAMIN D3) 25 MCG (1000 UNIT) tablet Take 1,000 Units by mouth in the morning and at bedtime.    digoxin (LANOXIN) 0.125 MG tablet TAKE (1/2) TABLET BY MOUTH ONCE DAILY.    furosemide (LASIX) 20 MG tablet Take 1 tablet (20 mg total) by mouth 3 (three) times a week. Every Mon, Wed, and Fri, can take on other days as needed    isosorbide mononitrate (IMDUR) 30 MG 24 hr tablet Take 15 mg by mouth daily.    LANTUS SOLOSTAR 100 UNIT/ML Solostar Pen INJECT 34 UNITS EVERY EVENING. 07/13/2021: At 2pm   metFORMIN (GLUCOPHAGE) 500 MG tablet Take 1/2 tablet po twice a day    Methylcobalamin (B12-ACTIVE PO) Take by mouth.    nortriptyline (PAMELOR) 10 MG capsule TAKE 1 CAPSULES AT BEDTIME FOR BURNING IN FEET.[28 IN PACKS/ NONE IN BOTTLE]    OLANZapine (ZYPREXA) 2.5 MG tablet One tablet twice a day    psyllium (METAMUCIL) 58.6 % powder Take 1 packet by mouth daily.    rosuvastatin (CRESTOR) 5 MG tablet TAKE (1) TABLET BY MOUTH ONCE DAILY AT BEDTIME.    spironolactone (ALDACTONE) 25 MG tablet TAKE 1/2 TABLET BY MOUTH ONCE DAILY.    TRUEPLUS PEN NEEDLES 31G X 8 MM MISC USE WITH LANTUS SOLOSTAR EVERY EVENING    No facility-administered encounter medications on file as of 07/22/2021.    Patient Active Problem List   Diagnosis Date Noted   Pulmonary fibrosis ([place]) 12/25/2020   Peripheral vascular disease, unspecified ([place])  10/21/2020   Late onset Alzheimer's disease with behavioral disturbance ([place]) 06/30/2020   CKD stage 3 due to type 2 diabetes mellitus ([place]) 05/15/2020   Diabetic peripheral neuropathy associated with type 2 diabetes mellitus ([place]) 05/15/2020   Hyperlipidemia associated with type 2 diabetes mellitus ([place]) 05/15/2020   Dementia without behavioral disturbance ([place]) 05/15/2020   Hypokalemia 05/15/2020   Chronic insomnia 05/15/2020   Acute on chronic HFrEF (heart failure with reduced ejection fraction) ([place]) 05/11/2020   Acute on  chronic systolic heart failure, NYHA class 3 ([place]) 05/11/2020   Senile purpura ([place]) 08/15/2018   Renal insufficiency 03/14/2018   Altered mental status [id number]   Chronic systolic heart failure ([place]) 02/09/2017   Controlled type 2 diabetes mellitus with hyperglycemia (HCC) 02/09/2017   Elevated troponin    S/P TAVR (transcatheter aortic valve replacement) 07/19/2016   Mitral regurgitation    VT (ventricular tachycardia) ([facility])    Shortness of breath 06/28/2016   Coronary artery disease involving native coronary artery of native heart with angina pectoris (HCC)    Acute on chronic combined systolic and diastolic CHF, NYHA class 4 (H[facility])    Anemia of chronic disease 04/12/2016   Orthostatic hypotension 08/20/2015   AKI (acute kidney injury) ([place]) 08/20/2015   Chronic asthma 08/20/2015   Cognitive dysfunction 05/29/2015   Chronic back pain 07/29/2014   Lumbar pain 06/23/2014   Chronic pain of right ankle 05/30/2014   Diabetic peripheral neuropathy ([place]) 05/30/2014   Chest congestion 05/02/2013   Wheezing 05/02/2013   Chronic constipation 04/30/2013   Leg edema, left 04/27/2013   Current use of long term anticoagulation 02/16/2013   Carotid stenosis 04/25/2012   BPH (benign prostatic hypertrophy) with urinary obstruction 09/27/2011   Dyspnea on exertion 08/01/2011   DM (diabetes mellitus), type 2 with renal complications ([place]) [id number]   Hypertension    Atrial fibrillation (HCC)    Hyperlipidemia    History of noncompliance with medical treatment    Aortic stenosis    Cerebrovascular disease 07/15/2010    Conditions to be addressed/monitored: Dementia; ADL IADL limitations, Cognitive Deficits, Memory Deficits, and Lacks knowledge of community resource:    Care Plan : LCSW Plan of Care  Updates made by Deirdre Peer, LCSW since 07/22/2021 12:00 AM     Problem: Mobility and Independence decline   Priority: High     Goal: Provide resources and support to aide in pt's  care where his Mobility and Independence are declining   Start Date: 07/22/2021  Expected End Date: 08/23/2021  This Visit's Progress: On track  Priority: High  Note:   Current barriers:    ADL IADL limitations, Cognitive Deficits, Memory Deficits, Inability to perform ADL's independently, and Lacks knowledge of community resource:   Clinical Goals: Patient will work with CSW to address needs related to in-home support/resources Clinical Interventions:  1:1 collaboration with primary care provider regarding development and update of comprehensive plan of care as evidenced by provider attestation and co-signature Inter-disciplinary care team collaboration (see longitudinal plan of care) Assessment of needs, barriers , agencies contacted, as well as how impacting Review various resources, discussed options and provided patient information about  Dementia resources and support  Home health request to resume/comtinue Supplies for home use Caregiver stress acknowledged , Increase in actives / exercise encouraged , and Collaborated with PCP Patient Goals/Self-Care Activities: Over the next 20 days Look for email regarding ordering of supplies/cost I will coordinate with PCP to assist with resuming home health physical therapy  begin a notebook of services in my neighborhood or community - call 211 when I need some help - follow-up on any referrals for help I am given - think ahead to make sure my need does not become an emergency - make a note about what I need to have by the phone or take with me, like an identification card or social security number have a back-up plan - have a back-up plan - make a list of family or friends that I can call          Follow Up Plan: Appointment scheduled for SW follow up with client by phone on:     08/05/21 Eduard Clos MSW, [place] Licensed Clinical Social Worker [place] [phone]

## 2021-08-02 NOTE — Addendum Note (Signed)
Addended by: Dairl Ponder on: 08/02/2021 08:42 AM   Modules accepted: Orders

## 2021-08-04 ENCOUNTER — Telehealth: Payer: Self-pay | Admitting: Family Medicine

## 2021-08-04 ENCOUNTER — Ambulatory Visit: Payer: PPO | Admitting: *Deleted

## 2021-08-04 DIAGNOSIS — I5022 Chronic systolic (congestive) heart failure: Secondary | ICD-10-CM

## 2021-08-04 DIAGNOSIS — N1832 Chronic kidney disease, stage 3b: Secondary | ICD-10-CM

## 2021-08-04 NOTE — Telephone Encounter (Signed)
Called and informed niece the referral coordinator is working on the referral request. She did confirm [facility] accept the insurance

## 2021-08-04 NOTE — Telephone Encounter (Signed)
Niece called to check on home health referral. Stated she call [facility] and they have not received a referral from Dr. Nicki Reaper and she was following up.  CB#  [phone]

## 2021-08-04 NOTE — Patient Instructions (Signed)
Visit Information   PATIENT GOALS:   Goals Addressed             This Visit's Progress    Monitor and Manage My Blood Sugar-Diabetes Type 2       Timeframe:  Long-Range Goal Priority:  Medium Start Date:           08/04/2021                  Expected End Date:    02/01/2022 Follow Up Date 09/15/2021   - check blood sugar at prescribed times - check blood sugar if I feel it is too high or too low - enter blood sugar readings and medication or insulin into daily log - take the blood sugar log to all doctor visits - take the blood sugar meter to all doctor visits  - follow carbohydrate modified diet - look over education mailed- hypoglycemia   Why is this important?   Checking your blood sugar at home helps to keep it from getting very high or very low.  Writing the results in a diary or log helps the doctor know how to care for you.  Your blood sugar log should have the time, date and the results.  Also, write down the amount of insulin or other medicine that you take.  Other information, like what you ate, exercise done and how you were feeling, will also be helpful.     Notes:      Track and Manage Fluids and Swelling-Heart Failure       Timeframe:  Long-Range Goal Priority:  Medium Start Date:        08/04/2021                     Expected End Date:      02/01/2022                  Follow Up Date 09/15/2021   - follow heart failure action plan if symptoms flare up - call your doctor early on for change in healthy status or symptoms - call office if I gain more than 3 pounds in one day or 5 pounds in one week (if you are able to weigh) - monitor swelling and call your doctor if you feel you have more fluid - take all medications as prescribed - practice safety precautions, keep pathways clear, always use your walker - follow a low salt diet- avoid salty snacks and fast food - continue checking blood pressure daily   Why is this important?   It is important to check your  weight daily and watch how much salt and liquids you have.  It will help you to manage your heart failure.    Notes:         Consent to CCM Services: Mr. Nodarse was given information about Chronic Care Management services including:  CCM service includes personalized support from designated clinical staff supervised by his physician, including individualized plan of care and coordination with other care providers 24/7 contact phone numbers for assistance for urgent and routine care needs. Service will only be billed when office clinical staff spend 20 minutes or more in a month to coordinate care. Only one practitioner may furnish and bill the service in a calendar month. The patient may stop CCM services at any time (effective at the end of the month) by phone call to the office staff. The patient will be responsible for cost sharing (co-pay) of  up to 20% of the service fee (after annual deductible is met).  Patient agreed to services and verbal consent obtained.   The patient verbalized understanding of instructions, educational materials, and care plan provided today and agreed to receive a mailed copy of patient instructions, educational materials, and care plan.   Telephone follow up appointment with care management team member scheduled for:   11/2/2022Heart Failure Action Plan A heart failure action plan helps you understand what to do when you have symptoms of heart failure. Your action plan is a color-coded plan that lists the symptoms to watch for and indicates what actions to take. If you have symptoms in the red zone, you need medical care right away. If you have symptoms in the yellow zone, you are having problems. If you have symptoms in the green zone, you are doing well. Follow the plan that was created by you and your health care provider. Review your plan each time you visit your health care provider. Red zone These signs and symptoms mean you should get medical help  right away: You have trouble breathing when resting. You have a dry cough that is getting worse. You have swelling or pain in your legs or abdomen that is getting worse. You suddenly gain more than 2-3 lb (0.9-1.4 kg) in 24 hours, or more than 5 lb (2.3 kg) in a week. This amount may be more or less depending on your condition. You have trouble staying awake or you feel confused. You have chest pain. You do not have an appetite. You pass out. You have worsening sadness or depression. If you have any of these symptoms, call your local emergency services (911 in the [place] right away. Do not drive yourself to the hospital. Yellow zone These signs and symptoms mean your condition may be getting worse and you should make some changes: You have trouble breathing when you are active, or you need to sleep with your head raised on extra pillows to help you breathe. You have swelling in your legs or abdomen. You gain 2-3 lb (0.9-1.4 kg) in 24 hours, or 5 lb (2.3 kg) in a week. This amount may be more or less depending on your condition. You get tired easily. You have trouble sleeping. You have a dry cough. If you have any of these symptoms: Contact your health care provider within the next day. Your health care provider may adjust your medicines. Green zone These signs mean you are doing well and can continue what you are doing: You do not have shortness of breath. You have very little swelling or no new swelling. Your weight is stable (no gain or loss). You have a normal activity level. You do not have chest pain or any other new symptoms. Follow these instructions at home: Take over-the-counter and prescription medicines only as told by your health care provider. Weigh yourself daily. Your target weight is __________ lb (__________ kg). Call your health care provider if you gain more than __________ lb (__________ kg) in 24 hours, or more than __________ lb (__________ kg) in a week. Health  care provider name: _____________________________________________________ Health care provider phone number: _____________________________________________________ Eat a heart-healthy diet. Work with a diet and nutrition specialist (dietitian) to create an eating plan that is best for you. Keep all follow-up visits. This is important. Where to find more information American Heart Association: www.heart.org Summary A heart failure action plan helps you understand what to do when you have symptoms of heart failure. Follow the  action plan that was created by you and your health care provider. Get help right away if you have any symptoms in the red zone. This information is not intended to replace advice given to you by your health care provider. Make sure you discuss any questions you have with your health care provider. Document Revised: 06/15/2020 Document Reviewed: 06/15/2020 Elsevier Patient Education  2022 [place]. Low-Sodium Eating Plan Sodium, which is an element that makes up salt, helps you maintain a healthy balance of fluids in your body. Too much sodium can increase your blood pressure and cause fluid and waste to be held in your body. Your health care provider or dietitian may recommend following this plan if you have high blood pressure (hypertension), kidney disease, liver disease, or heart failure. Eating less sodium can help lower your blood pressure, reduce swelling, and protect your heart, liver, and kidneys. What are tips for following this plan? Reading food labels The Nutrition Facts label lists the amount of sodium in one serving of the food. If you eat more than one serving, you must multiply the listed amount of sodium by the number of servings. Choose foods with less than 140 mg of sodium per serving. Avoid foods with 300 mg of sodium or more per serving. Shopping  Look for lower-sodium products, often labeled as "low-sodium" or "no salt added." Always check the  sodium content, even if foods are labeled as "unsalted" or "no salt added." Buy fresh foods. Avoid canned foods and pre-made or frozen meals. Avoid canned, cured, or processed meats. Buy breads that have less than 80 mg of sodium per slice. Cooking  Eat more home-cooked food and less restaurant, buffet, and fast food. Avoid adding salt when cooking. Use salt-free seasonings or herbs instead of table salt or sea salt. Check with your health care provider or pharmacist before using salt substitutes. Cook with plant-based oils, such as canola, sunflower, or olive oil. Meal planning When eating at a restaurant, ask that your food be prepared with less salt or no salt, if possible. Avoid dishes labeled as brined, pickled, cured, smoked, or made with soy sauce, miso, or teriyaki sauce. Avoid foods that contain MSG (monosodium glutamate). MSG is sometimes added to [place] food, bouillon, and some canned foods. Make meals that can be grilled, baked, poached, roasted, or steamed. These are generally made with less sodium. General information Most people on this plan should limit their sodium intake to 1,500-2,000 mg (milligrams) of sodium each day. What foods should I eat? Fruits Fresh, frozen, or canned fruit. Fruit juice. Vegetables Fresh or frozen vegetables. "No salt added" canned vegetables. "No salt added" tomato sauce and paste. Low-sodium or reduced-sodium tomato and vegetable juice. Grains Low-sodium cereals, including oats, puffed wheat and rice, and shredded wheat. Low-sodium crackers. Unsalted rice. Unsalted pasta. Low-sodium bread. Whole-grain breads and whole-grain pasta. Meats and other proteins Fresh or frozen (no salt added) meat, poultry, seafood, and fish. Low-sodium canned tuna and salmon. Unsalted nuts. Dried peas, beans, and lentils without added salt. Unsalted canned beans. Eggs. Unsalted nut butters. Dairy Milk. Soy milk. Cheese that is naturally low in sodium, such as  ricotta cheese, fresh mozzarella, or Swiss cheese. Low-sodium or reduced-sodium cheese. Cream cheese. Yogurt. Seasonings and condiments Fresh and dried herbs and spices. Salt-free seasonings. Low-sodium mustard and ketchup. Sodium-free salad dressing. Sodium-free light mayonnaise. Fresh or refrigerated horseradish. Lemon juice. Vinegar. Other foods Homemade, reduced-sodium, or low-sodium soups. Unsalted popcorn and pretzels. Low-salt or salt-free chips. The items listed  above may not be a complete list of foods and beverages you can eat. Contact a dietitian for more information. What foods should I avoid? Vegetables Sauerkraut, pickled vegetables, and relishes. Olives. [place] fries. Onion rings. Regular canned vegetables (not low-sodium or reduced-sodium). Regular canned tomato sauce and paste (not low-sodium or reduced-sodium). Regular tomato and vegetable juice (not low-sodium or reduced-sodium). Frozen vegetables in sauces. Grains Instant hot cereals. Bread stuffing, pancake, and biscuit mixes. Croutons. Seasoned rice or pasta mixes. Noodle soup cups. Boxed or frozen macaroni and cheese. Regular salted crackers. Self-rising flour. Meats and other proteins Meat or fish that is salted, canned, smoked, spiced, or pickled. Precooked or cured meat, such as sausages or meat loaves. Berniece Salines. Ham. Pepperoni. Hot dogs. Corned beef. Chipped beef. Salt pork. Jerky. Pickled herring. Anchovies and sardines. Regular canned tuna. Salted nuts. Dairy Processed cheese and cheese spreads. Hard cheeses. Cheese curds. Blue cheese. Feta cheese. String cheese. Regular cottage cheese. Buttermilk. Canned milk. Fats and oils Salted butter. Regular margarine. Ghee. Bacon fat. Seasonings and condiments Onion salt, garlic salt, seasoned salt, table salt, and sea salt. Canned and packaged gravies. Worcestershire sauce. Tartar sauce. Barbecue sauce. Teriyaki sauce. Soy sauce, including reduced-sodium. Steak sauce. Fish sauce.  Oyster sauce. Cocktail sauce. Horseradish that you find on the shelf. Regular ketchup and mustard. Meat flavorings and tenderizers. Bouillon cubes. Hot sauce. Pre-made or packaged marinades. Pre-made or packaged taco seasonings. Relishes. Regular salad dressings. Salsa. Other foods Salted popcorn and pretzels. Corn chips and puffs. Potato and tortilla chips. Canned or dried soups. Pizza. Frozen entrees and pot pies. The items listed above may not be a complete list of foods and beverages you should avoid. Contact a dietitian for more information. Summary Eating less sodium can help lower your blood pressure, reduce swelling, and protect your heart, liver, and kidneys. Most people on this plan should limit their sodium intake to 1,500-2,000 mg (milligrams) of sodium each day. Canned, boxed, and frozen foods are high in sodium. Restaurant foods, fast foods, and pizza are also very high in sodium. You also get sodium by adding salt to food. Try to cook at home, eat more fresh fruits and vegetables, and eat less fast food and canned, processed, or prepared foods. This information is not intended to replace advice given to you by your health care provider. Make sure you discuss any questions you have with your health care provider. Document Revised: 12/06/2019 Document Reviewed: 10/02/2019 Elsevier Patient Education  2022 [place]. Hypoglycemia Hypoglycemia is when the sugar (glucose) level in your blood is too low. Low blood sugar can happen to people who have diabetes and people who do not have diabetes. Low blood sugar can happen quickly, and it can be an emergency. What are the causes? This condition happens most often in people who have diabetes. It may be caused by: Diabetes medicine. Not eating enough, or not eating often enough. Doing more physical activity. Drinking alcohol on an empty stomach. If you do not have diabetes, this condition may be caused by: A tumor in the pancreas. Not  eating enough, or not eating for long periods at a time (fasting). A very bad infection or illness. Problems after having weight loss (bariatric) surgery. Kidney failure or liver failure. Certain medicines. What increases the risk? This condition is more likely to develop in people who: Have diabetes and take medicines to lower their blood sugar. Abuse alcohol. Have a very bad illness. What are the signs or symptoms? Mild Hunger. Sweating and feeling  clammy. Feeling dizzy or light-headed. Being sleepy or having trouble sleeping. Feeling like you may vomit (nauseous). A fast heartbeat. A headache. Blurry vision. Mood changes, such as: Being grouchy. Feeling worried or nervous (anxious). Tingling or loss of feeling (numbness) around your mouth, lips, or tongue. Moderate Confusion and poor judgment. Behavior changes. Weakness. Uneven heartbeat. Trouble with moving (coordination). Very low Very low blood sugar (severe hypoglycemia) is a medical emergency. It can cause: Fainting. Seizures. Loss of consciousness (coma). Death. How is this treated? Treating low blood sugar Low blood sugar is often treated by eating or drinking something that has sugar in it right away. The food or drink should contain 15 grams of a fast-acting carb (carbohydrate). Options include: 4 oz (120 mL) of fruit juice. 4 oz (120 mL) of regular soda (not diet soda). A few pieces of hard candy. Check food labels to see how many pieces to eat for 15 grams. 1 Tbsp (15 mL) of sugar or honey. 4 glucose tablets. 1 tube of glucose gel. Treating low blood sugar if you have diabetes If you can think clearly and swallow safely, follow the 15:15 rule: Take 15 grams of a fast-acting carb. Talk with your doctor about how much you should take. Always keep a source of fast-acting carb with you, such as: Glucose tablets (take 4 tablets). A few pieces of hard candy. Check food labels to see how many pieces to eat for  15 grams. 4 oz (120 mL) of fruit juice. 4 oz (120 mL) of regular soda (not diet soda). 1 Tbsp (15 mL) of honey or sugar. 1 tube of glucose gel. Check your blood sugar 15 minutes after you take the carb. If your blood sugar is still at or below 70 mg/dL (3.9 mmol/L), take 15 grams of a carb again. If your blood sugar does not go above 70 mg/dL (3.9 mmol/L) after 3 tries, get help right away. After your blood sugar goes back to normal, eat a meal or a snack within 1 hour.  Treating very low blood sugar If your blood sugar is below 54 mg/dL (3 mmol/L), you have very low blood sugar, or severe hypoglycemia. This is an emergency. Get medical help right away. If you have very low blood sugar and you cannot eat or drink, you will need to be given a hormone called glucagon. A family member or friend should learn how to check your blood sugar and how to give you glucagon. Ask your doctor if you need to have an emergency glucagon kit at home. Very low blood sugar may also need to be treated in a hospital. Follow these instructions at home: General instructions Take over-the-counter and prescription medicines only as told by your doctor. Stay aware of your blood sugar as told by your doctor. If you drink alcohol: Limit how much you have to: 0-1 drink a day for women who are not pregnant. 0-2 drinks a day for men. Know how much alcohol is in your drink. In the [place] one drink equals one 12 oz bottle of beer (355 mL), one 5 oz glass of wine (148 mL), or one 1 oz glass of hard liquor (44 mL). Be sure to eat food when you drink alcohol. Know that your body absorbs alcohol quickly. This may lead to low blood sugar later. Be sure to keep checking your blood sugar. Keep all follow-up visits. If you have diabetes:  Always have a fast-acting carb (15 grams) with you to treat low blood sugar. Follow  your diabetes care plan as told by your doctor. Make sure you: Know the symptoms of low blood sugar. Check  your blood sugar as often as told. Always check it before and after exercise. Always check your blood sugar before you drive. Take your medicines as told. Follow your meal plan. Eat on time. Do not skip meals. Share your diabetes care plan with: Your work or school. People you live with. Carry a card or wear jewelry that says you have diabetes. Where to find more information American Diabetes Association: www.diabetes.org Contact a doctor if: You have trouble keeping your blood sugar in your target range. You have low blood sugar often. Get help right away if: You still have symptoms after you eat or drink something that contains 15 grams of fast-acting carb, and you cannot get your blood sugar above 70 mg/dL by following the 15:15 rule. Your blood sugar is below 54 mg/dL (3 mmol/L). You have a seizure. You faint. These symptoms may be an emergency. Get help right away. Call your local emergency services (911 in the [place] Do not wait to see if the symptoms will go away. Do not drive yourself to the hospital. Summary Hypoglycemia happens when the level of sugar (glucose) in your blood is too low. Low blood sugar can happen to people who have diabetes and people who do not have diabetes. Low blood sugar can happen quickly, and it can be an emergency. Make sure you know the symptoms of low blood sugar and know how to treat it. Always keep a source of sugar (fast-acting carb) with you to treat low blood sugar. This information is not intended to replace advice given to you by your health care provider. Make sure you discuss any questions you have with your health care provider. Document Revised: 10/01/2020 Document Reviewed: 10/01/2020 Elsevier Patient Education  2022 [place]   Jacqlyn [facility], BSN RN [employer] [facility] [phone]

## 2021-08-04 NOTE — Chronic Care Management (AMB) (Signed)
Chronic Care Management   CCM RN Visit Note  08/04/2021 Name: [name] [name] MRN: [id number] DOB: [birth year]  Subjective: [name] [name] is a 87 y.o. year old male who is a primary care patient of Luking, Elayne Snare, MD. The care management team was consulted for assistance with disease management and care coordination needs.    Engaged with patient by telephone for initial visit in response to provider referral for case management and/or care coordination services.   Consent to Services:  The patient was given the following information about Chronic Care Management services today, agreed to services, and gave verbal consent: 1. CCM service includes personalized support from designated clinical staff supervised by the primary care provider, including individualized plan of care and coordination with other care providers 2. 24/7 contact phone numbers for assistance for urgent and routine care needs. 3. Service will only be billed when office clinical staff spend 20 minutes or more in a month to coordinate care. 4. Only one practitioner may furnish and bill the service in a calendar month. 5.The patient may stop CCM services at any time (effective at the end of the month) by phone call to the office staff. 6. The patient will be responsible for cost sharing (co-pay) of up to 20% of the service fee (after annual deductible is met). Patient agreed to services and consent obtained.  Patient agreed to services and verbal consent obtained.   Assessment: Review of patient past medical history, allergies, medications, health status, including review of consultants reports, laboratory and other test data, was performed as part of comprehensive evaluation and provision of chronic care management services.   SDOH (Social Determinants of Health) assessments and interventions performed:  SDOH Interventions    Flowsheet Row Most Recent Value  SDOH Interventions   Food Insecurity Interventions Intervention Not  Indicated  Housing Interventions Intervention Not Indicated  Transportation Interventions Intervention Not Indicated        CCM Care Plan  Allergies  Allergen Reactions   Ambien [Zolpidem Tartrate] Other (See Comments)    Sleep walks   Lipitor [Atorvastatin Calcium] Other (See Comments)    myalgias   Ranitidine Other (See Comments)    Chest discomfort   Simvastatin Other (See Comments)    Myalgias   Xanax Xr [Alprazolam Er] Other (See Comments)    Tightness in chest   Cholestatin Other (See Comments)    UNSPECIFIED REACTION    Clopidogrel Bisulfate Rash    Outpatient Encounter Medications as of 08/04/2021  Medication Sig Note   ALPRAZolam (XANAX) 0.25 MG tablet TAKE (1) TABLET BY MOUTH THREE TIMES DAILY AS NEEDED FOR ANXIETY.    bisoprolol (ZEBETA) 5 MG tablet TAKE 0.5 TABLET (2.5MG TOTAL) BY MOUTH ONCE DAILY.    cholecalciferol (VITAMIN D3) 25 MCG (1000 UNIT) tablet Take 1,000 Units by mouth in the morning and at bedtime.    digoxin (LANOXIN) 0.125 MG tablet TAKE (1/2) TABLET BY MOUTH ONCE DAILY.    furosemide (LASIX) 20 MG tablet Take 1 tablet (20 mg total) by mouth 3 (three) times a week. Every Mon, Wed, and Fri, can take on other days as needed    isosorbide mononitrate (IMDUR) 30 MG 24 hr tablet Take 15 mg by mouth daily.    LANTUS SOLOSTAR 100 UNIT/ML Solostar Pen INJECT 34 UNITS EVERY EVENING. (Patient taking differently: 42 Units.) 07/13/2021: At 2pm   metFORMIN (GLUCOPHAGE) 500 MG tablet Take 1/2 tablet po twice a day    Methylcobalamin (B12-ACTIVE PO) Take by mouth.  nortriptyline (PAMELOR) 10 MG capsule TAKE 1 CAPSULES AT BEDTIME FOR BURNING IN FEET.[28 IN PACKS/ NONE IN BOTTLE]    OLANZapine (ZYPREXA) 2.5 MG tablet One tablet twice a day    psyllium (METAMUCIL) 58.6 % powder Take 1 packet by mouth daily.    rosuvastatin (CRESTOR) 5 MG tablet TAKE (1) TABLET BY MOUTH ONCE DAILY AT BEDTIME.    spironolactone (ALDACTONE) 25 MG tablet TAKE 1/2 TABLET BY MOUTH ONCE  DAILY.    TRUEPLUS PEN NEEDLES 31G X 8 MM MISC USE WITH LANTUS SOLOSTAR EVERY EVENING    No facility-administered encounter medications on file as of 08/04/2021.    Patient Active Problem List   Diagnosis Date Noted   Pulmonary fibrosis ([place]) 12/25/2020   Peripheral vascular disease, unspecified ([place]) 10/21/2020   Late onset Alzheimer's disease with behavioral disturbance ([place]) 06/30/2020   CKD stage 3 due to type 2 diabetes mellitus ([place]) 05/15/2020   Diabetic peripheral neuropathy associated with type 2 diabetes mellitus ([place]) 05/15/2020   Hyperlipidemia associated with type 2 diabetes mellitus ([place]) 05/15/2020   Dementia without behavioral disturbance ([place]) 05/15/2020   Hypokalemia 05/15/2020   Chronic insomnia 05/15/2020   Acute on chronic HFrEF (heart failure with reduced ejection fraction) ([place]) 05/11/2020   Acute on chronic systolic heart failure, NYHA class 3 ([place]) 05/11/2020   Senile purpura ([place]) 08/15/2018   Renal insufficiency 03/14/2018   Altered mental status 14/43/1540   Chronic systolic heart failure ([place]) 02/09/2017   Controlled type 2 diabetes mellitus with hyperglycemia (HCC) 02/09/2017   Elevated troponin    S/P TAVR (transcatheter aortic valve replacement) 07/19/2016   Mitral regurgitation    VT (ventricular tachycardia) (HCC)    Shortness of breath 06/28/2016   Coronary artery disease involving native coronary artery of native heart with angina pectoris (HCC)    Acute on chronic combined systolic and diastolic CHF, NYHA class 4 (HCC)    Anemia of chronic disease 04/12/2016   Orthostatic hypotension 08/20/2015   AKI (acute kidney injury) ([place]) 08/20/2015   Chronic asthma 08/20/2015   Cognitive dysfunction 05/29/2015   Chronic back pain 07/29/2014   Lumbar pain 06/23/2014   Chronic pain of right ankle 05/30/2014   Diabetic peripheral neuropathy ([place]) 05/30/2014   Chest congestion 05/02/2013   Wheezing 05/02/2013   Chronic constipation 04/30/2013   Leg  edema, left 04/27/2013   Current use of long term anticoagulation 02/16/2013   Carotid stenosis 04/25/2012   BPH (benign prostatic hypertrophy) with urinary obstruction 09/27/2011   Dyspnea on exertion 08/01/2011   DM (diabetes mellitus), type 2 with renal complications ([place]) 08/67/6195   Hypertension    Atrial fibrillation ([place])    Hyperlipidemia    History of noncompliance with medical treatment    Aortic stenosis    Cerebrovascular disease 07/15/2010    Conditions to be addressed/monitored:CHF and DMII  Care Plan : Heart Failure (Adult)  Updates made by Kassie Mends, RN since 08/04/2021 12:00 AM     Problem: Symptom Exacerbation (Heart Failure)   Priority: Medium     Long-Range Goal: Symptom Exacerbation Prevented or Minimized   Start Date: 08/04/2021  Expected End Date: 02/01/2022  This Visit's Progress: On track  Priority: Medium  Note:   Current Barriers:  Knowledge deficit related to basic heart failure pathophysiology and self care management- needs teaching/ reinforcement HF action plan, symptom management, spoke with patient's nephew Remo Lipps and niece [name] as well as caregiver (pt has 24/7 hired caregivers), pt has Alzheimer's dementia and has had  2 falls in the last year. Has DME and uses walker, gait belt with ambulating,  Family reports home health PT was to be reinstated, pt was discharged 07/23/21 per [place], RN care manager to check into and will see if further orders placed, family states pt needs a better wheelchair and they would like PT to evaluate to determine what would be the best type and size of wheelchair.  Family reports pt has all medications and taking as prescribed, appetite is excellent, sleeping "okay", pt is unable to weigh at present due to difficulty standing. Cognitive Deficits -dementia Unable to perform ADLs independently- caregivers assist, pt requires 24 hour care Unable to perform IADLs independently- caregivers assist Case  Manager Clinical Goal(s):  patient will verbalize understanding of Heart Failure Action Plan and when to call doctor Interventions:  Collaboration with Kathyrn Drown, MD regarding development and update of comprehensive plan of care as evidenced by provider attestation and co-signature Inter-disciplinary care team collaboration (see longitudinal plan of care) Basic overview and discussion of pathophysiology of Heart Failure reviewed  Provided verbal education on low sodium diet Provided written education on low sodium diet- mailed  Reviewed importance of weighing daily if able (pt has not been able recently) Reviewed being mindful of symptom management such as edema, cough, dyspnea, importance of calling doctor early on for changes in health status/ symptoms Reviewed safety precautions Telephone call to [facility] in [place], spoke with Jeani Hawking and left Duwayne Heck (case manager in office) voicemail asking if she has orders for continuation of home health PT services, will await return phone call Self-Care Activities: Attends all scheduled provider appointments Calls provider office for new concerns or questions Patient Goals: - follow heart failure action plan if symptoms flare up - call your doctor early on for change in healthy status or symptoms - call office if I gain more than 3 pounds in one day or 5 pounds in one week (if you are able to weigh) - monitor swelling and call your doctor if you feel you have more fluid - take all medications as prescribed - practice safety precautions, keep pathways clear, always use your walker - follow a low salt diet- avoid salty snacks and fast food - continue checking blood pressure daily - keep legs up while sitting - watch for swelling in feet, ankles and legs every day Follow Up Plan: Telephone follow up appointment with care management team member scheduled for:    09/15/2021    Care Plan : Diabetes Type 2 (Adult)  Updates  made by Kassie Mends, RN since 08/04/2021 12:00 AM     Problem: Glycemic Management (Diabetes, Type 2)   Priority: Medium     Long-Range Goal: Glycemic Management Optimized   Start Date: 08/04/2021  Expected End Date: 02/01/2022  This Visit's Progress: On track  Priority: Medium  Note:   Objective:  Lab Results  Component Value Date   HGBA1C 8.6 (H) 04/22/2021   Lab Results  Component Value Date   CREATININE 1.18 07/13/2021   CREATININE 1.31 (H) 04/22/2021   CREATININE 1.10 02/02/2021   Lab Results  Component Value Date   EGFR 53 (L) 04/22/2021   Current Barriers:  Knowledge Deficits related to basic Diabetes pathophysiology and self care/management- caregivers check patient's CBG TID with fasting ranges 149-169 and random hs ranges 239-268, patient is trying to adhere to ADA/ carbohydrate modified diet, eats fast food on occasion, does not drink sugary drinks, is unable to exercise,  uses walker and can take approximately 25 steps with assistance x 1 and gait belt.  Patient just quit chewing tobacco and is using gum to help with this. Cognitive Deficits- Alzheimer's dementia Unable to perform ADLs independently- family/ CG assist Unable to perform IADLs independently- family/ CG assist Case Manager Clinical Goal(s):  patient will demonstrate improved adherence to prescribed treatment plan for diabetes self care/management as evidenced by: improvement in blood sugars and AIC daily monitoring and recording of CBG  adherence to ADA/ carb modified diet  adherence to prescribed medication regimen  contacting provider for new or worsened symptoms or questions Interventions:  Collaboration with Wolfgang Phoenix, Elayne Snare, MD regarding development and update of comprehensive plan of care as evidenced by provider attestation and co-signature Inter-disciplinary care team collaboration (see longitudinal plan of care) Provided education to patient about basic DM disease process Reviewed  medications with patient/ caregiver and discussed importance of medication adherence Discussed plans with patient for ongoing care management follow up and provided patient with direct contact information for care management team Provided patient with written educational materials related to hypo and hyperglycemia and importance of correct treatment Reviewed scheduled/upcoming provider appointments including: 10/11 primary care provider,  10/14 Dr. Haroldine Laws Review of patient status, including review of consultants reports, relevant laboratory and other test results, and medications completed. Reviewed carbohydrate modified diet and food choices Self-Care Activities  Attends all scheduled provider appointments Checks blood sugars as prescribed and utilize hyper and hypoglycemia protocol as needed Adheres to prescribed ADA/carb modified Patient Goals: - check blood sugar at prescribed times - check blood sugar if I feel it is too high or too low - enter blood sugar readings and medication or insulin into daily log - take the blood sugar log to all doctor visits - take the blood sugar meter to all doctor visits  - follow carbohydrate modified diet - look over education mailed- hypoglycemia - change to whole grain breads, cereal, pasta - fill half of plate with vegetables - limit fast food meals to no more than 1 per week - manage portion size - prepare main meal at home 3 to 5 days each week - read food labels for fat, fiber, carbohydrates and portion size - check feet daily for cuts, sores or redness - keep feet up while sitting - trim toenails straight across - wash and dry feet carefully every day - wear comfortable, cotton socks - wear comfortable, well-fitting shoes Follow Up Plan: Telephone follow up appointment with care management team member scheduled for:   09/15/2021     Plan:Telephone follow up appointment with care management team member scheduled for:  09/15/2021  Jacqlyn Larsen [facility], BSN RN Case Manager [place] [phone]

## 2021-08-05 ENCOUNTER — Ambulatory Visit: Payer: PPO | Admitting: *Deleted

## 2021-08-05 DIAGNOSIS — F0281 Dementia in other diseases classified elsewhere with behavioral disturbance: Secondary | ICD-10-CM

## 2021-08-05 DIAGNOSIS — Z794 Long term (current) use of insulin: Secondary | ICD-10-CM

## 2021-08-05 DIAGNOSIS — R269 Unspecified abnormalities of gait and mobility: Secondary | ICD-10-CM

## 2021-08-05 DIAGNOSIS — G301 Alzheimer's disease with late onset: Secondary | ICD-10-CM

## 2021-08-05 DIAGNOSIS — R531 Weakness: Secondary | ICD-10-CM

## 2021-08-05 NOTE — Patient Instructions (Signed)
Visit Information  PATIENT GOALS:  Goals Addressed             This Visit's Progress    Find Help in My Community       Timeframe:  Short-Term Goal Priority:  High Start Date:    07/22/21                         Expected End Date:             08/23/21          Follow Up Date   08/23/21          - begin a notebook of services in my neighborhood or community - call 211 when I need some help - follow-up on any referrals for help I am given - think ahead to make sure my need does not become an emergency - make a note about what I need to have by the phone or take with me, like an identification card or social security number have a back-up plan - have a back-up plan - make a list of family or friends that I can call    Why is this important?   Knowing how and where to find help for yourself or family in your neighborhood and community is an important skill.  You will want to take some steps to learn how.    Notes:         The patient verbalized understanding of instructions, educational materials, and care plan provided today and declined offer to receive copy of patient instructions, educational materials, and care plan.   Telephone follow up appointment with care management team member scheduled for:08/23/21 Eduard Clos MSW, LCSW Licensed Clinical Social Worker [facility] [phone]

## 2021-08-05 NOTE — Chronic Care Management (AMB) (Signed)
Chronic Care Management    Clinical Social Work Note  08/05/2021 Name: [name] [name] MRN: [id number] DOB: 23-Jan-1932  [name] [name] is a 87 y.o. year old male who is a primary care patient of Luking, Elayne Snare, MD. The CCM team was consulted to assist the patient with chronic disease management and/or care coordination needs related to: [employer]  and Caregiver Stress.   Engaged with patient by telephone for follow up visit in response to provider referral for social work chronic care management and care coordination services.   Consent to Services:  The patient was given information about Chronic Care Management services, agreed to services, and gave verbal consent prior to initiation of services.  Please see initial visit note for detailed documentation.   Patient agreed to services and consent obtained.   Assessment: Review of patient past medical history, allergies, medications, and health status, including review of relevant consultants reports was performed today as part of a comprehensive evaluation and provision of chronic care management and care coordination services.     SDOH (Social Determinants of Health) assessments and interventions performed:    Advanced Directives Status: Not addressed in this encounter.  CCM Care Plan  Allergies  Allergen Reactions   Ambien [Zolpidem Tartrate] Other (See Comments)    Sleep walks   Lipitor [Atorvastatin Calcium] Other (See Comments)    myalgias   Ranitidine Other (See Comments)    Chest discomfort   Simvastatin Other (See Comments)    Myalgias   Xanax Xr [Alprazolam Er] Other (See Comments)    Tightness in chest   Cholestatin Other (See Comments)    UNSPECIFIED REACTION    Clopidogrel Bisulfate Rash    Outpatient Encounter Medications as of 08/05/2021  Medication Sig Note   ALPRAZolam (XANAX) 0.25 MG tablet TAKE (1) TABLET BY MOUTH THREE TIMES DAILY AS NEEDED FOR ANXIETY.    bisoprolol (ZEBETA) 5 MG tablet TAKE 0.5  TABLET (2.5MG  TOTAL) BY MOUTH ONCE DAILY.    cholecalciferol (VITAMIN D3) 25 MCG (1000 UNIT) tablet Take 1,000 Units by mouth in the morning and at bedtime.    digoxin (LANOXIN) 0.125 MG tablet TAKE (1/2) TABLET BY MOUTH ONCE DAILY.    furosemide (LASIX) 20 MG tablet Take 1 tablet (20 mg total) by mouth 3 (three) times a week. Every Mon, Wed, and Fri, can take on other days as needed    isosorbide mononitrate (IMDUR) 30 MG 24 hr tablet Take 15 mg by mouth daily.    LANTUS SOLOSTAR 100 UNIT/ML Solostar Pen INJECT 34 UNITS EVERY EVENING. (Patient taking differently: 42 Units.) 07/13/2021: At 2pm   metFORMIN (GLUCOPHAGE) 500 MG tablet Take 1/2 tablet po twice a day    Methylcobalamin (B12-ACTIVE PO) Take by mouth.    nortriptyline (PAMELOR) 10 MG capsule TAKE 1 CAPSULES AT BEDTIME FOR BURNING IN FEET.[28 IN PACKS/ NONE IN BOTTLE]    OLANZapine (ZYPREXA) 2.5 MG tablet One tablet twice a day    psyllium (METAMUCIL) 58.6 % powder Take 1 packet by mouth daily.    rosuvastatin (CRESTOR) 5 MG tablet TAKE (1) TABLET BY MOUTH ONCE DAILY AT BEDTIME.    spironolactone (ALDACTONE) 25 MG tablet TAKE 1/2 TABLET BY MOUTH ONCE DAILY.    TRUEPLUS PEN NEEDLES 31G X 8 MM MISC USE WITH LANTUS SOLOSTAR EVERY EVENING    No facility-administered encounter medications on file as of 08/05/2021.    Patient Active Problem List   Diagnosis Date Noted   Pulmonary fibrosis ([place]) 12/25/2020  Peripheral vascular disease, unspecified ([place]) 10/21/2020   Late onset Alzheimer's disease with behavioral disturbance ([place]) 06/30/2020   CKD stage 3 due to type 2 diabetes mellitus ([place]) 05/15/2020   Diabetic peripheral neuropathy associated with type 2 diabetes mellitus ([place]) 05/15/2020   Hyperlipidemia associated with type 2 diabetes mellitus ([place]) 05/15/2020   Dementia without behavioral disturbance ([place]) 05/15/2020   Hypokalemia 05/15/2020   Chronic insomnia 05/15/2020   Acute on chronic HFrEF (heart failure with reduced  ejection fraction) ([place]) 05/11/2020   Acute on chronic systolic heart failure, NYHA class 3 ([place]) 05/11/2020   Senile purpura ([place]) 08/15/2018   Renal insufficiency 03/14/2018   Altered mental status 93/23/5573   Chronic systolic heart failure ([place]) 02/09/2017   Controlled type 2 diabetes mellitus with hyperglycemia (HCC) 02/09/2017   Elevated troponin    S/P TAVR (transcatheter aortic valve replacement) 07/19/2016   Mitral regurgitation    VT (ventricular tachycardia) (HCC)    Shortness of breath 06/28/2016   Coronary artery disease involving native coronary artery of native heart with angina pectoris (HCC)    Acute on chronic combined systolic and diastolic CHF, NYHA class 4 (HCC)    Anemia of chronic disease 04/12/2016   Orthostatic hypotension 08/20/2015   AKI (acute kidney injury) ([place]) 08/20/2015   Chronic asthma 08/20/2015   Cognitive dysfunction 05/29/2015   Chronic back pain 07/29/2014   Lumbar pain 06/23/2014   Chronic pain of right ankle 05/30/2014   Diabetic peripheral neuropathy ([place]) 05/30/2014   Chest congestion 05/02/2013   Wheezing 05/02/2013   Chronic constipation 04/30/2013   Leg edema, left 04/27/2013   Current use of long term anticoagulation 02/16/2013   Carotid stenosis 04/25/2012   BPH (benign prostatic hypertrophy) with urinary obstruction 09/27/2011   Dyspnea on exertion 08/01/2011   DM (diabetes mellitus), type 2 with renal complications ([place]) 22/12/5425   Hypertension    Atrial fibrillation ([place])    Hyperlipidemia    History of noncompliance with medical treatment    Aortic stenosis    Cerebrovascular disease 07/15/2010    Conditions to be addressed/monitored: Dementia; ADL IADL limitations, Cognitive Deficits, and Memory Deficits  Care Plan : LCSW Plan of Care  Updates made by Deirdre Peer, LCSW since 08/05/2021 12:00 AM     Problem: Mobility and Independence decline   Priority: High     Goal: Provide resources and support to aide in  pt's care where his Mobility and Independence are declining   Start Date: 07/22/2021  Expected End Date: 08/23/2021  This Visit's Progress: On track  Recent Progress: On track  Priority: High  Note:   Current barriers:    ADL IADL limitations, Cognitive Deficits, Memory Deficits, Inability to perform ADL's independently, and Lacks knowledge of community resource:   Clinical Goals: Patient will work with CSW to address needs related to in-home support/resources Clinical Interventions:  Per niece pt is getting weaker and they are still awaiting HH PT to begin again. CSW will inquire with PCP office.  1:1 collaboration with primary care provider regarding development and update of comprehensive plan of care as evidenced by provider attestation and co-signature Inter-disciplinary care team collaboration (see longitudinal plan of care) Assessment of needs, barriers , agencies contacted, as well as how impacting Review various resources, discussed options and provided patient information about  Dementia resources and support  Home health request to resume/comtinue Supplies for home use Caregiver stress acknowledged , Increase in actives / exercise encouraged , and Collaborated with PCP Patient Goals/Self-Care Activities:  Over the next 20 days Look for email regarding ordering of supplies/cost I will coordinate with PCP to assist with resuming home health physical therapy  begin a notebook of services in my neighborhood or community - call 211 when I need some help - follow-up on any referrals for help I am given - think ahead to make sure my need does not become an emergency - make a note about what I need to have by the phone or take with me, like an identification card or social security number have a back-up plan - have a back-up plan - make a list of family or friends that I can call          Follow Up Plan: Appointment scheduled for SW follow up with client by phone on: 08/23/21       Eduard Clos MSW, [place] Licensed Clinical Social Worker [place] [phone]

## 2021-08-13 DIAGNOSIS — N1832 Chronic kidney disease, stage 3b: Secondary | ICD-10-CM | POA: Diagnosis not present

## 2021-08-13 DIAGNOSIS — F0281 Dementia in other diseases classified elsewhere with behavioral disturbance: Secondary | ICD-10-CM | POA: Diagnosis not present

## 2021-08-13 DIAGNOSIS — G301 Alzheimer's disease with late onset: Secondary | ICD-10-CM

## 2021-08-13 DIAGNOSIS — Z794 Long term (current) use of insulin: Secondary | ICD-10-CM

## 2021-08-13 DIAGNOSIS — I5022 Chronic systolic (congestive) heart failure: Secondary | ICD-10-CM | POA: Diagnosis not present

## 2021-08-13 DIAGNOSIS — E1122 Type 2 diabetes mellitus with diabetic chronic kidney disease: Secondary | ICD-10-CM | POA: Diagnosis not present

## 2021-08-14 ENCOUNTER — Inpatient Hospital Stay (HOSPITAL_COMMUNITY)
Admission: EM | Admit: 2021-08-14 | Discharge: 2021-08-21 | DRG: 177 | Disposition: A | Discharge status: Hospice | Payer: PPO | Attending: Internal Medicine | Admitting: Internal Medicine

## 2021-08-14 ENCOUNTER — Encounter (HOSPITAL_COMMUNITY): Payer: Self-pay | Admitting: Emergency Medicine

## 2021-08-14 ENCOUNTER — Other Ambulatory Visit: Payer: Self-pay

## 2021-08-14 ENCOUNTER — Emergency Department (HOSPITAL_COMMUNITY): Payer: PPO

## 2021-08-14 DIAGNOSIS — K760 Fatty (change of) liver, not elsewhere classified: Secondary | ICD-10-CM | POA: Diagnosis present

## 2021-08-14 DIAGNOSIS — Z823 Family history of stroke: Secondary | ICD-10-CM

## 2021-08-14 DIAGNOSIS — Z91199 Patient's noncompliance with other medical treatment and regimen due to unspecified reason: Secondary | ICD-10-CM

## 2021-08-14 DIAGNOSIS — J9811 Atelectasis: Secondary | ICD-10-CM | POA: Diagnosis not present

## 2021-08-14 DIAGNOSIS — G301 Alzheimer's disease with late onset: Secondary | ICD-10-CM | POA: Diagnosis not present

## 2021-08-14 DIAGNOSIS — G9341 Metabolic encephalopathy: Secondary | ICD-10-CM | POA: Diagnosis not present

## 2021-08-14 DIAGNOSIS — R0602 Shortness of breath: Secondary | ICD-10-CM | POA: Diagnosis not present

## 2021-08-14 DIAGNOSIS — E1122 Type 2 diabetes mellitus with diabetic chronic kidney disease: Secondary | ICD-10-CM | POA: Diagnosis present

## 2021-08-14 DIAGNOSIS — N1832 Chronic kidney disease, stage 3b: Secondary | ICD-10-CM | POA: Diagnosis not present

## 2021-08-14 DIAGNOSIS — I4891 Unspecified atrial fibrillation: Secondary | ICD-10-CM | POA: Diagnosis present

## 2021-08-14 DIAGNOSIS — J690 Pneumonitis due to inhalation of food and vomit: Principal | ICD-10-CM | POA: Diagnosis present

## 2021-08-14 DIAGNOSIS — E872 Acidosis, unspecified: Secondary | ICD-10-CM | POA: Diagnosis present

## 2021-08-14 DIAGNOSIS — K219 Gastro-esophageal reflux disease without esophagitis: Secondary | ICD-10-CM | POA: Diagnosis present

## 2021-08-14 DIAGNOSIS — R531 Weakness: Secondary | ICD-10-CM

## 2021-08-14 DIAGNOSIS — R410 Disorientation, unspecified: Secondary | ICD-10-CM | POA: Diagnosis not present

## 2021-08-14 DIAGNOSIS — I130 Hypertensive heart and chronic kidney disease with heart failure and stage 1 through stage 4 chronic kidney disease, or unspecified chronic kidney disease: Secondary | ICD-10-CM | POA: Diagnosis not present

## 2021-08-14 DIAGNOSIS — I350 Nonrheumatic aortic (valve) stenosis: Secondary | ICD-10-CM

## 2021-08-14 DIAGNOSIS — I10 Essential (primary) hypertension: Secondary | ICD-10-CM

## 2021-08-14 DIAGNOSIS — Z794 Long term (current) use of insulin: Secondary | ICD-10-CM

## 2021-08-14 DIAGNOSIS — F02818 Dementia in other diseases classified elsewhere, unspecified severity, with other behavioral disturbance: Secondary | ICD-10-CM | POA: Diagnosis present

## 2021-08-14 DIAGNOSIS — J9601 Acute respiratory failure with hypoxia: Secondary | ICD-10-CM | POA: Diagnosis present

## 2021-08-14 DIAGNOSIS — F0390 Unspecified dementia without behavioral disturbance: Secondary | ICD-10-CM | POA: Diagnosis present

## 2021-08-14 DIAGNOSIS — E1165 Type 2 diabetes mellitus with hyperglycemia: Secondary | ICD-10-CM | POA: Diagnosis not present

## 2021-08-14 DIAGNOSIS — Z7984 Long term (current) use of oral hypoglycemic drugs: Secondary | ICD-10-CM

## 2021-08-14 DIAGNOSIS — Z833 Family history of diabetes mellitus: Secondary | ICD-10-CM

## 2021-08-14 DIAGNOSIS — R404 Transient alteration of awareness: Secondary | ICD-10-CM | POA: Diagnosis not present

## 2021-08-14 DIAGNOSIS — I482 Chronic atrial fibrillation, unspecified: Secondary | ICD-10-CM | POA: Diagnosis not present

## 2021-08-14 DIAGNOSIS — E1142 Type 2 diabetes mellitus with diabetic polyneuropathy: Secondary | ICD-10-CM | POA: Diagnosis present

## 2021-08-14 DIAGNOSIS — I5022 Chronic systolic (congestive) heart failure: Secondary | ICD-10-CM | POA: Diagnosis present

## 2021-08-14 DIAGNOSIS — E1151 Type 2 diabetes mellitus with diabetic peripheral angiopathy without gangrene: Secondary | ICD-10-CM | POA: Diagnosis present

## 2021-08-14 DIAGNOSIS — Z953 Presence of xenogenic heart valve: Secondary | ICD-10-CM

## 2021-08-14 DIAGNOSIS — I2510 Atherosclerotic heart disease of native coronary artery without angina pectoris: Secondary | ICD-10-CM | POA: Diagnosis present

## 2021-08-14 DIAGNOSIS — E785 Hyperlipidemia, unspecified: Secondary | ICD-10-CM | POA: Diagnosis not present

## 2021-08-14 DIAGNOSIS — J45909 Unspecified asthma, uncomplicated: Secondary | ICD-10-CM | POA: Diagnosis not present

## 2021-08-14 DIAGNOSIS — E1129 Type 2 diabetes mellitus with other diabetic kidney complication: Secondary | ICD-10-CM | POA: Diagnosis present

## 2021-08-14 DIAGNOSIS — Z888 Allergy status to other drugs, medicaments and biological substances status: Secondary | ICD-10-CM

## 2021-08-14 DIAGNOSIS — M1990 Unspecified osteoarthritis, unspecified site: Secondary | ICD-10-CM | POA: Diagnosis present

## 2021-08-14 DIAGNOSIS — Z66 Do not resuscitate: Secondary | ICD-10-CM | POA: Diagnosis not present

## 2021-08-14 DIAGNOSIS — Z20822 Contact with and (suspected) exposure to covid-19: Secondary | ICD-10-CM | POA: Diagnosis not present

## 2021-08-14 DIAGNOSIS — Z515 Encounter for palliative care: Secondary | ICD-10-CM | POA: Diagnosis not present

## 2021-08-14 DIAGNOSIS — Z79899 Other long term (current) drug therapy: Secondary | ICD-10-CM

## 2021-08-14 DIAGNOSIS — Z7401 Bed confinement status: Secondary | ICD-10-CM | POA: Diagnosis not present

## 2021-08-14 DIAGNOSIS — Z23 Encounter for immunization: Secondary | ICD-10-CM | POA: Diagnosis not present

## 2021-08-14 DIAGNOSIS — Z8673 Personal history of transient ischemic attack (TIA), and cerebral infarction without residual deficits: Secondary | ICD-10-CM

## 2021-08-14 DIAGNOSIS — Z87891 Personal history of nicotine dependence: Secondary | ICD-10-CM

## 2021-08-14 DIAGNOSIS — Z952 Presence of prosthetic heart valve: Secondary | ICD-10-CM

## 2021-08-14 DIAGNOSIS — E860 Dehydration: Secondary | ICD-10-CM | POA: Diagnosis not present

## 2021-08-14 DIAGNOSIS — Z7901 Long term (current) use of anticoagulants: Secondary | ICD-10-CM

## 2021-08-14 DIAGNOSIS — F0394 Unspecified dementia, unspecified severity, with anxiety: Secondary | ICD-10-CM | POA: Diagnosis not present

## 2021-08-14 DIAGNOSIS — I340 Nonrheumatic mitral (valve) insufficiency: Secondary | ICD-10-CM | POA: Diagnosis present

## 2021-08-14 DIAGNOSIS — R5381 Other malaise: Secondary | ICD-10-CM | POA: Diagnosis not present

## 2021-08-14 DIAGNOSIS — R4182 Altered mental status, unspecified: Secondary | ICD-10-CM | POA: Diagnosis not present

## 2021-08-14 DIAGNOSIS — I447 Left bundle-branch block, unspecified: Secondary | ICD-10-CM | POA: Diagnosis present

## 2021-08-14 DIAGNOSIS — I480 Paroxysmal atrial fibrillation: Secondary | ICD-10-CM | POA: Diagnosis present

## 2021-08-14 DIAGNOSIS — R739 Hyperglycemia, unspecified: Secondary | ICD-10-CM | POA: Diagnosis not present

## 2021-08-14 LAB — URINALYSIS, ROUTINE W REFLEX MICROSCOPIC
Bacteria, UA: NONE SEEN
Bilirubin Urine: NEGATIVE
Glucose, UA: >=500 mg/dL — AB
Hgb urine dipstick: NEGATIVE
Ketones, ur: NEGATIVE mg/dL
Leukocytes,Ua: NEGATIVE
Nitrite: NEGATIVE
Protein, ur: NEGATIVE mg/dL
Specific Gravity, Urine: 1.009 (ref 1.005–1.030)
pH: 6 (ref 5.0–8.0)

## 2021-08-14 LAB — BASIC METABOLIC PANEL
Anion gap: 10 (ref 5–15)
BUN: 29 mg/dL — ABNORMAL HIGH (ref 8–23)
CO2: 22 mmol/L (ref 22–32)
Calcium: 9 mg/dL (ref 8.9–10.3)
Chloride: 99 mmol/L (ref 98–111)
Creatinine, Ser: 1.25 mg/dL — ABNORMAL HIGH (ref 0.61–1.24)
GFR, Estimated: 56 mL/min — ABNORMAL LOW (ref >60)
Glucose, Bld: 286 mg/dL — ABNORMAL HIGH (ref 70–99)
Potassium: 4.9 mmol/L (ref 3.5–5.1)
Sodium: 131 mmol/L — ABNORMAL LOW (ref 135–145)

## 2021-08-14 LAB — RESP PANEL BY RT-PCR (FLU A&B, COVID) ARPGX2
Influenza A by PCR: NEGATIVE
Influenza B by PCR: NEGATIVE
SARS Coronavirus 2 by RT PCR: NEGATIVE

## 2021-08-14 LAB — CBC
HCT: 45.4 % (ref 39.0–52.0)
Hemoglobin: 15.5 g/dL (ref 13.0–17.0)
MCH: 31.9 pg (ref 26.0–34.0)
MCHC: 34.1 g/dL (ref 30.0–36.0)
MCV: 93.4 fL (ref 80.0–100.0)
Platelets: 167 10*3/uL (ref 150–400)
RBC: 4.86 MIL/uL (ref 4.22–5.81)
RDW: 13.7 % (ref 11.5–15.5)
WBC: 7.8 10*3/uL (ref 4.0–10.5)
nRBC: 0 % (ref 0.0–0.2)

## 2021-08-14 LAB — DIGOXIN LEVEL: Digoxin Level: 0.2 ng/mL — ABNORMAL LOW (ref 0.8–2.0)

## 2021-08-14 LAB — LACTIC ACID, PLASMA: Lactic Acid, Venous: 2.5 mmol/L (ref 0.5–1.9)

## 2021-08-14 MED ORDER — SODIUM CHLORIDE 0.9 % IV SOLN: INTRAVENOUS | Status: DC

## 2021-08-14 MED ORDER — SODIUM CHLORIDE 0.9 % IV BOLUS
500.0000 mL | Freq: Once | INTRAVENOUS | Status: AC
  Administered 2021-08-14: 500 mL via INTRAVENOUS

## 2021-08-14 NOTE — ED Provider Notes (Signed)
[name] EMERGENCY DEPARTMENT Provider Note   CSN: [id number] Arrival date & time: 08/14/21  1632     History Chief Complaint  Patient presents with   Weakness    [name] [name] is a 87 y.o. male.   Weakness  This patient is an 87 year old male, he has a known history of congestive heart failure on digoxin, aortic stenosis, asthma, atrial fibrillation, coronary disease, history of prior stroke, he is a diabetic who also has hyperlipidemia hypertension and mitral regurgitation.  He did have a transcatheter aortic valve replacement.  The patient presents to the hospital today with a complaint of generalized weakness and hoarseness, he has 24-hour a day home health nursing, they noted that he was weaker than usual and had a hoarse voice and they wanted him to be evaluated.  He does not have any complaints to me other than stating he is frustrated that he has to wear a pull-up due to urinary incontinence which is a chronic problem for him.  No diarrhea no abdominal pain no chest pain no shortness of breath no cough no headache no focal weakness of the arms or the legs.  Past Medical History:  Diagnosis Date   Acute on chronic systolic CHF (congestive heart failure) ([place]) 02/09/2017   Acute respiratory failure with hypoxia (HCC)    AKI (acute kidney injury) ([place]) 08/20/2015   Altered mental status 02/21/2017   Aortic stenosis    a. mod-sev by echo 05/2016.   Asthma    Asthma exacerbation 08/20/2015   Atrial fibrillation (HCC)    Paroxysmal; Echo in 2007-normal EF; mild LVH; LA enlargement; mild AS, minimal AI; neg. stress nuclear study in 2008    BPH (benign prostatic hypertrophy) with urinary obstruction 09/27/2011   Transurethral resection of the prostate in 09/2011    CAD (coronary artery disease), native coronary artery 06/28/2016   Cardiogenic shock ([place])    Carotid stenosis 04/25/2012   Carotid US 9/21: Bilateral ICA 1-39   Cerebrovascular disease 07/2010   TIA; carotid  ultrasound in 07/2010-significant bilateral plaque without focal internal carotid artery stenosis; MRI -encephalomalacia left temporal and right temporal lobes; small inferior right cerebellar infarct; small vessel disease   Chronic atrial fibrillation (HCC)    Paroxysmal; Echocardiogram in 2007-normal EF; mild LVH; left atrial enlargement; mild stenosis and minimal AI; negative stress nuclear study in 2010   Chronic systolic CHF (congestive heart failure) ([place])    a. dx 05/2016 - EF 35-40%, diffuse HK, mod-severe AS, mod gradient, severe AVA VTI likely due to decreased cardiac output in setting of systolic dysfsunction and significant mitral regurgitation, mild MR, mod-severe MR, severe LAE, mild-mod RV dilation, mild RAE, mild-mod TR, mod PASP 41mmHg.   CKD (chronic kidney disease), stage II    Cognitive dysfunction 05/29/2015   I believe that this is related to his age as well as previous stroke    Congestive heart failure (CHF) ([place]) 02/09/2017   Degenerative joint disease    feet and legs   Diabetes mellitus    no insulin; A1c of 6.6 in 2005   Dizziness    occurs daily,especially in am   Elevated troponin    Exertional dyspnea    Gastroesophageal reflux disease    Hepatic steatosis    History of noncompliance with medical treatment    Hyperlipidemia    adverse reactions to statins and niacin   Hypertension    Borderline   Irregular heartbeat    Mitral regurgitation    a. mod-sev  by echo 05/2016   Peripheral vascular disease ([place])    Renal insufficiency    S/P TAVR (transcatheter aortic valve replacement) 07/19/2016   29 mm Edwards Sapien 3 transcatheter heart valve placed via left percutaneous transfemoral approach   Senile purpura ([place]) 08/15/2018   Temporal arteritis (HCC)    Tricuspid regurgitation    a. mild-mod TR by echo 05/2016   VT (ventricular tachycardia)     Patient Active Problem List   Diagnosis Date Noted   Generalized weakness 08/14/2021   Pulmonary fibrosis ([place])  12/25/2020   Peripheral vascular disease, unspecified ([place]) 10/21/2020   Late onset Alzheimer's disease with behavioral disturbance ([place]) 06/30/2020   CKD stage 3 due to type 2 diabetes mellitus ([place]) 05/15/2020   Diabetic peripheral neuropathy associated with type 2 diabetes mellitus ([place]) 05/15/2020   Hyperlipidemia associated with type 2 diabetes mellitus ([place]) 05/15/2020   Dementia without behavioral disturbance ([place]) 05/15/2020   Hypokalemia 05/15/2020   Chronic insomnia 05/15/2020   Acute on chronic HFrEF (heart failure with reduced ejection fraction) ([place]) 05/11/2020   Acute on chronic systolic heart failure, NYHA class 3 ([place]) 05/11/2020   Senile purpura ([place]) 08/15/2018   Renal insufficiency 03/14/2018   Altered mental status 24/58/0998   Chronic systolic heart failure ([place]) 02/09/2017   Controlled type 2 diabetes mellitus with hyperglycemia ([place]) 02/09/2017   Elevated troponin    S/P TAVR (transcatheter aortic valve replacement) 07/19/2016   Mitral regurgitation    VT (ventricular tachycardia)    Shortness of breath 06/28/2016   Coronary artery disease involving native coronary artery of native heart with angina pectoris (HCC)    Acute on chronic combined systolic and diastolic CHF, NYHA class 4 ([place])    Anemia of chronic disease 04/12/2016   Orthostatic hypotension 08/20/2015   AKI (acute kidney injury) ([place]) 08/20/2015   Chronic asthma 08/20/2015   Cognitive dysfunction 05/29/2015   Chronic back pain 07/29/2014   Lumbar pain 06/23/2014   Chronic pain of right ankle 05/30/2014   Diabetic peripheral neuropathy ([place]) 05/30/2014   Chest congestion 05/02/2013   Wheezing 05/02/2013   Chronic constipation 04/30/2013   Leg edema, left 04/27/2013   Current use of long term anticoagulation 02/16/2013   Carotid stenosis 04/25/2012   BPH (benign prostatic hypertrophy) with urinary obstruction 09/27/2011   Dyspnea on exertion 08/01/2011   DM (diabetes mellitus), type 2 with  renal complications ([place]) 33/82/5053   Hypertension    Atrial fibrillation ([place])    Hyperlipidemia    History of noncompliance with medical treatment    Aortic stenosis    Cerebrovascular disease 07/15/2010    Past Surgical History:  Procedure Laterality Date   COLONOSCOPY  2002   COLONOSCOPY  01/19/2012   Procedure: COLONOSCOPY;  Surgeon: Rogene Houston, MD;  [facility];  Service: Endoscopy;  Laterality: N/A;  [place]   Right   PERIPHERAL VASCULAR CATHETERIZATION N/A 07/11/2016   Procedure: Carotid PTA/Stent Intervention;  Surgeon: Lorretta Harp, MD;  Location: [place] [facility] LAB;  Service: Cardiovascular;  Laterality: N/A;   PROSTATE SURGERY  12/2011   RIGHT HEART CATH AND CORONARY ANGIOGRAPHY N/A 05/11/2020   Procedure: RIGHT HEART CATH AND CORONARY ANGIOGRAPHY;  Surgeon: Nelva Bush, MD;  Location: [place] [facility] LAB;  Service: Cardiovascular;  Laterality: N/A;   ROTATOR CUFF REPAIR     Right   TEE WITHOUT CARDIOVERSION N/A 06/17/2016   Procedure: TRANSESOPHAGEAL ECHOCARDIOGRAM (  TEE);  Surgeon: Jerline Pain, MD;  Location: [facility] ENDOSCOPY;  Service: Cardiovascular;  Laterality: N/A;   TEE WITHOUT CARDIOVERSION N/A 07/19/2016   Procedure: TRANSESOPHAGEAL ECHOCARDIOGRAM (TEE);  Surgeon: Sherren Mocha, MD;  Location: [place];  Service: Open Heart Surgery;  Laterality: N/A;   TRANSCATHETER AORTIC VALVE REPLACEMENT, TRANSFEMORAL N/A 07/19/2016   Procedure: TRANSCATHETER AORTIC VALVE REPLACEMENT, TRANSFEMORAL;  Surgeon: Sherren Mocha, MD;  Location: [place];  Service: Open Heart Surgery;  Laterality: N/A;   TRANSURETHRAL RESECTION OF PROSTATE  09/2011   URETHRAL STRICTURE DILATATION  1980s       Family History  Problem Relation Age of Onset   Stroke Mother    Diabetes Father    Colon cancer Neg Hx     Social History   Tobacco Use   Smoking status: Former    Packs/day: 1.00    Years: 20.00    Pack years: 20.00     Types: Cigarettes    Start date: 07/04/1950    Quit date: 04/25/1992    Years since quitting: 29.3   Smokeless tobacco: Current    Types: Chew  Substance Use Topics   Alcohol use: No    Alcohol/week: 0.0 standard drinks   Drug use: No    Home Medications Prior to Admission medications   Medication Sig Start Date End Date Taking? Authorizing Provider  acetaminophen (TYLENOL) 325 MG tablet Take 650 mg by mouth every 6 (six) hours as needed for mild pain or moderate pain.   Yes [provider]  ALPRAZolam (XANAX) 0.25 MG tablet TAKE (1) TABLET BY MOUTH THREE TIMES DAILY AS NEEDED FOR ANXIETY. Patient taking differently: Take 0.25 mg by mouth 3 (three) times daily as needed for anxiety. 01/05/21  Yes Luking, Elayne Snare, MD  bisoprolol (ZEBETA) 5 MG tablet TAKE 0.5 TABLET (2.5MG  TOTAL) BY MOUTH ONCE DAILY. 07/06/21  Yes Kathyrn Drown, MD  cholecalciferol (VITAMIN D3) 25 MCG (1000 UNIT) tablet Take 1,000 Units by mouth daily.   Yes [provider]  digoxin (LANOXIN) 0.125 MG tablet TAKE (1/2) TABLET BY MOUTH ONCE DAILY. Patient taking differently: Take 0.0625 mg by mouth daily. 02/17/21  Yes Bensimhon, Shaune Pascal, MD  furosemide (LASIX) 20 MG tablet Take 1 tablet (20 mg total) by mouth 3 (three) times a week. Every Mon, Wed, and Fri, can take on other days as needed 01/08/21  Yes Bensimhon, Shaune Pascal, MD  isosorbide mononitrate (IMDUR) 30 MG 24 hr tablet Take 15 mg by mouth daily. 05/26/20  Yes [provider]  LANTUS SOLOSTAR 100 UNIT/ML Solostar Pen INJECT 34 UNITS EVERY EVENING. Patient taking differently: 42 Units. 01/20/21  Yes Kathyrn Drown, MD  metFORMIN (GLUCOPHAGE) 500 MG tablet Take 1/2 tablet po twice a day Patient taking differently: Take 500 mg by mouth 2 (two) times daily. Take 1/2 tablet po twice a day 04/26/21  Yes Luking, Scott A, MD  Methylcobalamin (B12-ACTIVE PO) Take 500 mg by mouth daily.   Yes [provider]  nortriptyline (PAMELOR) 10 MG capsule  TAKE 1 CAPSULES AT BEDTIME FOR BURNING IN FEET.[28 IN PACKS/ NONE IN BOTTLE] Patient taking differently: Take 10 mg by mouth at bedtime. TAKE 1 CAPSULES AT BEDTIME FOR BURNING IN FEET.[28 IN PACKS/ NONE IN BOTTLE] 12/22/20  Yes Luking, Elayne Snare, MD  OLANZapine (ZYPREXA) 2.5 MG tablet One tablet twice a day Patient taking differently: Take 2.5 mg by mouth 2 (two) times daily. One tablet twice a day 05/26/21  Yes Luking, Elayne Snare, MD  psyllium (METAMUCIL) 58.6 % powder Take 1 packet by mouth daily as needed.   Yes [provider]  rosuvastatin (CRESTOR) 5 MG tablet TAKE (1) TABLET BY MOUTH ONCE DAILY AT BEDTIME. 02/15/21  Yes Luking, Elayne Snare, MD  spironolactone (ALDACTONE) 25 MG tablet TAKE 1/2 TABLET BY MOUTH ONCE DAILY. Patient taking differently: Take 12.5 mg by mouth at bedtime. 07/06/21  Yes Bensimhon, Shaune Pascal, MD  TRUEPLUS PEN NEEDLES 31G X 8 MM MISC USE WITH LANTUS SOLOSTAR EVERY EVENING 01/04/21   Kathyrn Drown, MD    Allergies    Ambien [zolpidem tartrate], Lipitor [atorvastatin calcium], Ranitidine, Simvastatin, Xanax xr [alprazolam er], Cholestatin, and Clopidogrel bisulfate  Review of Systems   Review of Systems  Neurological:  Positive for weakness.  All other systems reviewed and are negative.  Physical Exam Updated Vital Signs BP (!) 162/59   Pulse 62   Temp (!) 97.5 F (36.4 C) (Oral)   Resp (!) 22   Ht 1.854 m (6\' 1" )   Wt 95.3 kg   SpO2 100%   BMI 27.71 kg/m   Physical Exam Vitals and nursing note reviewed.  Constitutional:      General: He is not in acute distress.    Appearance: He is well-developed.  HENT:     Head: Normocephalic and atraumatic.     Mouth/Throat:     Pharynx: No oropharyngeal exudate.     Comments: Mucous membranes are normal-appearing, he has evidence of chewing tobacco in his oropharynx as well as on his lips.  He states he does chew tobacco frequently Eyes:     General: No scleral icterus.       Right eye: No discharge.         Left eye: No discharge.     Conjunctiva/sclera: Conjunctivae normal.     Pupils: Pupils are equal, round, and reactive to light.  Neck:     Thyroid: No thyromegaly.     Vascular: No JVD.  Cardiovascular:     Rate and Rhythm: Normal rate and regular rhythm.     Heart sounds: Murmur heard.    No friction rub. No gallop.  Pulmonary:     Effort: Pulmonary effort is normal. No respiratory distress.     Breath sounds: Normal breath sounds. No wheezing or rales.  Abdominal:     General: Bowel sounds are normal. There is no distension.     Palpations: Abdomen is soft. There is no mass.     Tenderness: There is no abdominal tenderness.  Musculoskeletal:        General: No tenderness. Normal range of motion.     Cervical back: Normal range of motion and neck supple.     Right lower leg: No edema.     Left lower leg: No edema.  Lymphadenopathy:     Cervical: No cervical adenopathy.  Skin:    General: Skin is warm and dry.     Findings: No erythema or rash.  Neurological:     Mental Status: He is alert.     Coordination: Coordination normal.     Comments: The patient has normal strength in all 4 extremities, he does need some assistance getting to the sitting position but is able to assist and hold himself in the sitting position without difficulty.  He can straight leg raise and grip with normal strength.  Cranial nerves III through XII are normal, his voice is very soft in fact he speaks with a hoarse voice but has  no sore throat no trismus or torticollis  Psychiatric:        Behavior: Behavior normal.    ED Results / Procedures / Treatments   Labs (all labs ordered are listed, but only abnormal results are displayed) Labs Reviewed  BASIC METABOLIC PANEL - Abnormal; Notable for the following components:      Result Value   Sodium 131 (*)    Glucose, Bld 286 (*)    BUN 29 (*)    Creatinine, Ser 1.25 (*)    GFR, Estimated 56 (*)    All other components within normal limits   URINALYSIS, ROUTINE W REFLEX MICROSCOPIC - Abnormal; Notable for the following components:   Color, Urine STRAW (*)    Glucose, UA >=500 (*)    All other components within normal limits  LACTIC ACID, PLASMA - Abnormal; Notable for the following components:   Lactic Acid, Venous 2.5 (*)    All other components within normal limits  DIGOXIN LEVEL - Abnormal; Notable for the following components:   Digoxin Level 0.2 (*)    All other components within normal limits  RESP PANEL BY RT-PCR (FLU A&B, COVID) [id number]  URINE CULTURE  CBC    EKG EKG Interpretation  Date/Time:  Saturday August 14 2021 17:33:17 EDT Ventricular Rate:  72 PR Interval:    QRS Duration: 132 QT Interval:  391 QTC Calculation: 428 R Axis:   -51 Text Interpretation: Atrial fibrillation Left bundle branch block since last tracing no significant change Confirmed by Noemi Chapel [id number]) on 08/14/2021 6:03:38 PM  Radiology DG Chest Portable 1 View  Result Date: 08/14/2021 CLINICAL DATA:  Weakness.  Hoarse voice. EXAM: PORTABLE CHEST 1 VIEW COMPARISON:  Chest radiograph 07/13/2021. FINDINGS: Monitoring leads overlie the patient. Stable cardiac and mediastinal contours. No consolidative pulmonary opacities. No pleural effusion or pneumothorax. IMPRESSION: No acute cardiopulmonary process. Electronically Signed   By: Lovey Newcomer M.D.   On: 08/14/2021 19:13    Procedures Procedures   Medications Ordered in ED Medications  0.9 %  sodium chloride infusion ( Intravenous New Bag/Given 08/14/21 1845)  sodium chloride 0.9 % bolus 500 mL (0 mLs Intravenous Stopped 08/14/21 2209)    ED Course  I have reviewed the triage vital signs and the nursing notes.  Pertinent labs & imaging results that were available during my care of the patient were reviewed by me and considered in my medical decision making (see chart for details).    MDM Rules/Calculators/A&P                           The patient's EKG is rather  unremarkable, he has atrial fibrillation with a left bundle branch block but is rate controlled.  Obtaining chest x-ray and labs, vital signs are unremarkable, the patient is agreeable to the plan.  We will also need a urinalysis  Vitals:   08/14/21 1728 08/14/21 1732 08/14/21 1736  BP:  136/68   Pulse:  75   Resp:  20   Temp:  (!) 97.5 F (36.4 C)   TempSrc:  Oral   SpO2: 98% 100%   Weight:   95.3 kg  Height:   1.854 m (6\' 1" )   Thankfully the patient had no acute findings on his work-up, his vital signs remained in a very stable range other than some hypertension.  There is no signs of infection or significant anemia.  I discussed the case with his daughter who came to  see him in the hospital and she reports that he has been so weak he cannot even stand with multiple people assisting him at home.  The hospitalist has been kind enough to admit this patient in the hospital.  I appreciate her willingness to take great care of our patients  Final Clinical Impression(s) / ED Diagnoses Final diagnoses:  Generalized weakness  Primary hypertension     Noemi Chapel, MD 08/14/21 2330

## 2021-08-14 NOTE — ED Triage Notes (Signed)
Patient coming from home by [facility]- health aid reports patient was extremely weak today, unable to ambulate and hoarse voice which is not normal for him. Patient A&0x3- unsure of date. EMS unable to provide if that is patients baseline.

## 2021-08-14 NOTE — ED Notes (Signed)
Patients brief removed and primo fit placed on patient. Patient repositioned in the bed. Niece at bedside with patient. Call light within reach.

## 2021-08-15 ENCOUNTER — Encounter (HOSPITAL_COMMUNITY): Payer: Self-pay | Admitting: Internal Medicine

## 2021-08-15 DIAGNOSIS — R531 Weakness: Secondary | ICD-10-CM | POA: Diagnosis not present

## 2021-08-15 DIAGNOSIS — Z23 Encounter for immunization: Secondary | ICD-10-CM | POA: Diagnosis present

## 2021-08-15 DIAGNOSIS — F0394 Unspecified dementia, unspecified severity, with anxiety: Secondary | ICD-10-CM | POA: Diagnosis not present

## 2021-08-15 DIAGNOSIS — I5022 Chronic systolic (congestive) heart failure: Secondary | ICD-10-CM | POA: Diagnosis present

## 2021-08-15 DIAGNOSIS — E860 Dehydration: Secondary | ICD-10-CM | POA: Diagnosis present

## 2021-08-15 DIAGNOSIS — G301 Alzheimer's disease with late onset: Secondary | ICD-10-CM

## 2021-08-15 DIAGNOSIS — F0284 Dementia in other diseases classified elsewhere, unspecified severity, with anxiety: Secondary | ICD-10-CM

## 2021-08-15 DIAGNOSIS — E1142 Type 2 diabetes mellitus with diabetic polyneuropathy: Secondary | ICD-10-CM | POA: Diagnosis present

## 2021-08-15 DIAGNOSIS — I130 Hypertensive heart and chronic kidney disease with heart failure and stage 1 through stage 4 chronic kidney disease, or unspecified chronic kidney disease: Secondary | ICD-10-CM | POA: Diagnosis present

## 2021-08-15 DIAGNOSIS — Z515 Encounter for palliative care: Secondary | ICD-10-CM | POA: Diagnosis not present

## 2021-08-15 DIAGNOSIS — I482 Chronic atrial fibrillation, unspecified: Secondary | ICD-10-CM | POA: Diagnosis present

## 2021-08-15 DIAGNOSIS — J9601 Acute respiratory failure with hypoxia: Secondary | ICD-10-CM | POA: Diagnosis present

## 2021-08-15 DIAGNOSIS — E1122 Type 2 diabetes mellitus with diabetic chronic kidney disease: Secondary | ICD-10-CM | POA: Diagnosis present

## 2021-08-15 DIAGNOSIS — Z794 Long term (current) use of insulin: Secondary | ICD-10-CM

## 2021-08-15 DIAGNOSIS — N1832 Chronic kidney disease, stage 3b: Secondary | ICD-10-CM | POA: Diagnosis present

## 2021-08-15 DIAGNOSIS — E785 Hyperlipidemia, unspecified: Secondary | ICD-10-CM | POA: Diagnosis present

## 2021-08-15 DIAGNOSIS — F02818 Dementia in other diseases classified elsewhere, unspecified severity, with other behavioral disturbance: Secondary | ICD-10-CM | POA: Diagnosis present

## 2021-08-15 DIAGNOSIS — Z953 Presence of xenogenic heart valve: Secondary | ICD-10-CM | POA: Diagnosis not present

## 2021-08-15 DIAGNOSIS — K760 Fatty (change of) liver, not elsewhere classified: Secondary | ICD-10-CM | POA: Diagnosis present

## 2021-08-15 DIAGNOSIS — G9341 Metabolic encephalopathy: Secondary | ICD-10-CM | POA: Diagnosis not present

## 2021-08-15 DIAGNOSIS — E1151 Type 2 diabetes mellitus with diabetic peripheral angiopathy without gangrene: Secondary | ICD-10-CM | POA: Diagnosis present

## 2021-08-15 DIAGNOSIS — Z66 Do not resuscitate: Secondary | ICD-10-CM | POA: Diagnosis present

## 2021-08-15 DIAGNOSIS — I2510 Atherosclerotic heart disease of native coronary artery without angina pectoris: Secondary | ICD-10-CM | POA: Diagnosis present

## 2021-08-15 DIAGNOSIS — E872 Acidosis, unspecified: Secondary | ICD-10-CM | POA: Diagnosis present

## 2021-08-15 DIAGNOSIS — J690 Pneumonitis due to inhalation of food and vomit: Secondary | ICD-10-CM | POA: Diagnosis present

## 2021-08-15 DIAGNOSIS — Z20822 Contact with and (suspected) exposure to covid-19: Secondary | ICD-10-CM | POA: Diagnosis present

## 2021-08-15 DIAGNOSIS — J45909 Unspecified asthma, uncomplicated: Secondary | ICD-10-CM | POA: Diagnosis present

## 2021-08-15 DIAGNOSIS — E1165 Type 2 diabetes mellitus with hyperglycemia: Secondary | ICD-10-CM | POA: Diagnosis present

## 2021-08-15 LAB — CBC WITH DIFFERENTIAL/PLATELET
Abs Immature Granulocytes: 0.1 10*3/uL — ABNORMAL HIGH (ref 0.00–0.07)
Basophils Absolute: 0 10*3/uL (ref 0.0–0.1)
Basophils Relative: 0 %
Eosinophils Absolute: 0.2 10*3/uL (ref 0.0–0.5)
Eosinophils Relative: 2 %
HCT: 43.9 % (ref 39.0–52.0)
Hemoglobin: 14.8 g/dL (ref 13.0–17.0)
Immature Granulocytes: 1 %
Lymphocytes Relative: 13 %
Lymphs Abs: 1 10*3/uL (ref 0.7–4.0)
MCH: 31.5 pg (ref 26.0–34.0)
MCHC: 33.7 g/dL (ref 30.0–36.0)
MCV: 93.4 fL (ref 80.0–100.0)
Monocytes Absolute: 0.6 10*3/uL (ref 0.1–1.0)
Monocytes Relative: 8 %
Neutro Abs: 6.1 10*3/uL (ref 1.7–7.7)
Neutrophils Relative %: 76 %
Platelets: 153 10*3/uL (ref 150–400)
RBC: 4.7 MIL/uL (ref 4.22–5.81)
RDW: 13.7 % (ref 11.5–15.5)
WBC: 8.1 10*3/uL (ref 4.0–10.5)
nRBC: 0 % (ref 0.0–0.2)

## 2021-08-15 LAB — LACTIC ACID, PLASMA: Lactic Acid, Venous: 1.7 mmol/L (ref 0.5–1.9)

## 2021-08-15 LAB — GLUCOSE, CAPILLARY
Glucose-Capillary: 217 mg/dL — ABNORMAL HIGH (ref 70–99)
Glucose-Capillary: 258 mg/dL — ABNORMAL HIGH (ref 70–99)

## 2021-08-15 LAB — CBG MONITORING, ED
Glucose-Capillary: 208 mg/dL — ABNORMAL HIGH (ref 70–99)
Glucose-Capillary: 300 mg/dL — ABNORMAL HIGH (ref 70–99)

## 2021-08-15 LAB — COMPREHENSIVE METABOLIC PANEL
ALT: 25 U/L (ref 0–44)
AST: 24 U/L (ref 15–41)
Albumin: 3.8 g/dL (ref 3.5–5.0)
Alkaline Phosphatase: 80 U/L (ref 38–126)
Anion gap: 7 (ref 5–15)
BUN: 24 mg/dL — ABNORMAL HIGH (ref 8–23)
CO2: 26 mmol/L (ref 22–32)
Calcium: 9.1 mg/dL (ref 8.9–10.3)
Chloride: 103 mmol/L (ref 98–111)
Creatinine, Ser: 1.1 mg/dL (ref 0.61–1.24)
GFR, Estimated: >60 mL/min (ref >60)
Glucose, Bld: 251 mg/dL — ABNORMAL HIGH (ref 70–99)
Potassium: 4.2 mmol/L (ref 3.5–5.1)
Sodium: 136 mmol/L (ref 135–145)
Total Bilirubin: 0.8 mg/dL (ref 0.3–1.2)
Total Protein: 7.1 g/dL (ref 6.5–8.1)

## 2021-08-15 LAB — MAGNESIUM: Magnesium: 1.8 mg/dL (ref 1.7–2.4)

## 2021-08-15 LAB — HEMOGLOBIN A1C
Hgb A1c MFr Bld: 8.6 % — ABNORMAL HIGH (ref 4.8–5.6)
Mean Plasma Glucose: 200.12 mg/dL

## 2021-08-15 MED ORDER — INSULIN GLARGINE 100 UNIT/ML SOLOSTAR PEN: 34.0000 [IU] | PEN_INJECTOR | Freq: Every day | SUBCUTANEOUS | Status: DC

## 2021-08-15 MED ORDER — MORPHINE SULFATE (PF) 2 MG/ML IV SOLN: 2.0000 mg | INTRAVENOUS | Status: DC | PRN

## 2021-08-15 MED ORDER — ACETAMINOPHEN 325 MG PO TABS: 650.0000 mg | ORAL_TABLET | Freq: Four times a day (QID) | ORAL | Status: DC | PRN

## 2021-08-15 MED ORDER — INSULIN ASPART 100 UNIT/ML IJ SOLN
0.0000 [IU] | Freq: Every day | INTRAMUSCULAR | Status: DC
Start: 2021-08-15 — End: 2021-08-18
  Administered 2021-08-15: 3 [IU] via SUBCUTANEOUS

## 2021-08-15 MED ORDER — ROSUVASTATIN CALCIUM 10 MG PO TABS
5.0000 mg | ORAL_TABLET | Freq: Every day | ORAL | Status: DC
  Administered 2021-08-15 – 2021-08-17 (×3): 5 mg via ORAL
  Filled 2021-08-15 (×3): qty 1

## 2021-08-15 MED ORDER — SODIUM CHLORIDE 0.9 % IV SOLN: INTRAVENOUS | Status: DC

## 2021-08-15 MED ORDER — ONDANSETRON HCL 4 MG PO TABS: 4.0000 mg | ORAL_TABLET | Freq: Four times a day (QID) | ORAL | Status: DC | PRN

## 2021-08-15 MED ORDER — ONDANSETRON HCL 4 MG/2ML IJ SOLN: 4.0000 mg | Freq: Four times a day (QID) | INTRAMUSCULAR | Status: DC | PRN

## 2021-08-15 MED ORDER — OLANZAPINE 5 MG PO TABS
2.5000 mg | ORAL_TABLET | Freq: Two times a day (BID) | ORAL | Status: DC
  Administered 2021-08-15 – 2021-08-17 (×7): 2.5 mg via ORAL
  Filled 2021-08-15 (×8): qty 1

## 2021-08-15 MED ORDER — ISOSORBIDE MONONITRATE ER 30 MG PO TB24
15.0000 mg | ORAL_TABLET | Freq: Every day | ORAL | Status: DC
  Administered 2021-08-15 – 2021-08-17 (×3): 15 mg via ORAL
  Filled 2021-08-15 (×4): qty 1

## 2021-08-15 MED ORDER — OXYCODONE HCL 5 MG PO TABS: 5.0000 mg | ORAL_TABLET | ORAL | Status: DC | PRN

## 2021-08-15 MED ORDER — INSULIN ASPART 100 UNIT/ML IJ SOLN
0.0000 [IU] | Freq: Three times a day (TID) | INTRAMUSCULAR | Status: DC
Start: 2021-08-15 — End: 2021-08-18
  Administered 2021-08-15: 8 [IU] via SUBCUTANEOUS
  Administered 2021-08-15 (×2): 5 [IU] via SUBCUTANEOUS
  Administered 2021-08-16: 2 [IU] via SUBCUTANEOUS
  Administered 2021-08-16: 3 [IU] via SUBCUTANEOUS
  Administered 2021-08-16: 8 [IU] via SUBCUTANEOUS
  Administered 2021-08-17 – 2021-08-18 (×4): 5 [IU] via SUBCUTANEOUS
  Filled 2021-08-15 (×2): qty 1

## 2021-08-15 MED ORDER — INFLUENZA VAC A&B SA ADJ QUAD 0.5 ML IM PRSY
0.5000 mL | PREFILLED_SYRINGE | INTRAMUSCULAR | Status: AC
  Administered 2021-08-16: 0.5 mL via INTRAMUSCULAR
  Filled 2021-08-15: qty 0.5

## 2021-08-15 MED ORDER — HEPARIN SODIUM (PORCINE) 5000 UNIT/ML IJ SOLN
5000.0000 [IU] | Freq: Three times a day (TID) | INTRAMUSCULAR | Status: DC
Start: 2021-08-15 — End: 2021-08-18
  Administered 2021-08-15 – 2021-08-18 (×10): 5000 [IU] via SUBCUTANEOUS
  Filled 2021-08-15 (×11): qty 1

## 2021-08-15 MED ORDER — INSULIN GLARGINE-YFGN 100 UNIT/ML ~~LOC~~ SOLN
34.0000 [IU] | Freq: Every day | SUBCUTANEOUS | Status: DC
  Administered 2021-08-15: 34 [IU] via SUBCUTANEOUS
  Filled 2021-08-15: qty 0.34

## 2021-08-15 MED ORDER — DIGOXIN 125 MCG PO TABS
0.0625 mg | ORAL_TABLET | Freq: Every day | ORAL | Status: DC
  Administered 2021-08-15 – 2021-08-17 (×3): 0.0625 mg via ORAL
  Filled 2021-08-15 (×4): qty 1

## 2021-08-15 MED ORDER — BISOPROLOL FUMARATE 5 MG PO TABS
5.0000 mg | ORAL_TABLET | Freq: Every day | ORAL | Status: DC
  Administered 2021-08-15 – 2021-08-17 (×3): 5 mg via ORAL
  Filled 2021-08-15 (×4): qty 1

## 2021-08-15 MED ORDER — ACETAMINOPHEN 650 MG RE SUPP
650.0000 mg | Freq: Four times a day (QID) | RECTAL | Status: DC | PRN
  Filled 2021-08-15: qty 1

## 2021-08-15 MED ORDER — SPIRONOLACTONE 12.5 MG HALF TABLET
12.5000 mg | ORAL_TABLET | Freq: Every day | ORAL | Status: DC
  Administered 2021-08-15: 12.5 mg via ORAL
  Filled 2021-08-15 (×2): qty 1

## 2021-08-15 MED ORDER — NORTRIPTYLINE HCL 10 MG PO CAPS
10.0000 mg | ORAL_CAPSULE | Freq: Every day | ORAL | Status: DC
  Administered 2021-08-15 – 2021-08-17 (×4): 10 mg via ORAL
  Filled 2021-08-15 (×8): qty 1

## 2021-08-15 MED ORDER — ALPRAZOLAM 0.25 MG PO TABS
0.2500 mg | ORAL_TABLET | Freq: Three times a day (TID) | ORAL | Status: DC | PRN
  Administered 2021-08-15 – 2021-08-16 (×3): 0.25 mg via ORAL
  Filled 2021-08-15 (×3): qty 1

## 2021-08-15 MED ORDER — SPIRONOLACTONE 25 MG PO TABS
12.5000 mg | ORAL_TABLET | Freq: Every day | ORAL | Status: DC
  Administered 2021-08-16 – 2021-08-17 (×2): 12.5 mg via ORAL
  Filled 2021-08-15: qty 0.5
  Filled 2021-08-15: qty 1
  Filled 2021-08-15: qty 0.5
  Filled 2021-08-15: qty 1
  Filled 2021-08-15 (×2): qty 0.5

## 2021-08-15 NOTE — NC FL2 (Signed)
[place] MEDICAID FL2 LEVEL OF CARE SCREENING TOOL     IDENTIFICATION  Patient Name: [name] [name] Birthdate: 1932-10-13 Sex: male Admission Date (Current Location): 08/14/2021  [facility] and [place] Number:  [employer] and Address:  [facility] [address] [place]      Provider Number: [id number]  Attending Physician Name and Address:  [place] Donnamarie Poag, DO  Relative Name and Phone Number:       Current Level of Care: Hospital Recommended Level of Care: [place] Prior Approval Number:    Date Approved/Denied:   PASRR Number:    Discharge Plan: SNF    Current Diagnoses: Patient Active Problem List   Diagnosis Date Noted   Dehydration 08/15/2021   Generalized weakness 08/14/2021   Pulmonary fibrosis ([place]) 12/25/2020   Peripheral vascular disease, unspecified ([place]) 10/21/2020   Late onset Alzheimer's disease with behavioral disturbance ([place]) 06/30/2020   CKD stage 3 due to type 2 diabetes mellitus ([place]) 05/15/2020   Diabetic peripheral neuropathy associated with type 2 diabetes mellitus ([place]) 05/15/2020   Hyperlipidemia associated with type 2 diabetes mellitus ([place]) 05/15/2020   Dementia ([place]) 05/15/2020   Hypokalemia 05/15/2020   Chronic insomnia 05/15/2020   Acute on chronic HFrEF (heart failure with reduced ejection fraction) ([place]) 05/11/2020   Acute on chronic systolic heart failure, NYHA class 3 ([place]) 05/11/2020   Senile purpura ([place]) 08/15/2018   Renal insufficiency 03/14/2018   Altered mental status 14/48/1856   Chronic systolic heart failure ([place]) 02/09/2017   Controlled type 2 diabetes mellitus with hyperglycemia (HCC) 02/09/2017   Elevated troponin    S/P TAVR (transcatheter aortic valve replacement) 07/19/2016   Mitral regurgitation    VT (ventricular tachycardia)    Shortness of breath 06/28/2016   Coronary artery disease involving native coronary artery of native heart with angina pectoris (HCC)     Acute on chronic combined systolic and diastolic CHF, NYHA class 4 (HCC)    Anemia of chronic disease 04/12/2016   Orthostatic hypotension 08/20/2015   AKI (acute kidney injury) ([place]) 08/20/2015   Chronic asthma 08/20/2015   Cognitive dysfunction 05/29/2015   Chronic back pain 07/29/2014   Lumbar pain 06/23/2014   Chronic pain of right ankle 05/30/2014   Diabetic peripheral neuropathy ([place]) 05/30/2014   Chest congestion 05/02/2013   Wheezing 05/02/2013   Chronic constipation 04/30/2013   Leg edema, left 04/27/2013   Current use of long term anticoagulation 02/16/2013   Carotid stenosis 04/25/2012   BPH (benign prostatic hypertrophy) with urinary obstruction 09/27/2011   Dyspnea on exertion 08/01/2011   DM (diabetes mellitus), type 2 with renal complications ([place]) 31/49/7026   Hypertension    Atrial fibrillation (HCC)    Hyperlipidemia    History of noncompliance with medical treatment    Aortic stenosis    Cerebrovascular disease 07/15/2010    Orientation RESPIRATION BLADDER Height & Weight     Self, Situation  Normal Incontinent, External catheter Weight: 210 lb (95.3 kg) Height:  6\' 1"  (185.4 cm)  BEHAVIORAL SYMPTOMS/MOOD NEUROLOGICAL BOWEL NUTRITION STATUS      Incontinent Diet  AMBULATORY STATUS COMMUNICATION OF NEEDS Skin   Extensive Assist Verbally Normal                       Personal Care Assistance Level of Assistance  Bathing, Dressing, Feeding, Total care Bathing Assistance: Limited assistance Feeding assistance: Independent Dressing Assistance: Limited assistance Total Care Assistance: Maximum assistance  Functional Limitations Info  Sight, Hearing, Speech Sight Info: Adequate Hearing Info: Adequate Speech Info: Adequate    SPECIAL CARE FACTORS FREQUENCY  PT (By licensed PT), OT (By licensed OT)     PT Frequency: 5 times weekly OT Frequency: 5 times weekly            Contractures Contractures Info: Not present    Additional Factors  Info  Code Status, Allergies Code Status Info: DNR Allergies Info: Ambien (zolpidem Tartrate), Lipitor (atorvastatin Calcium), Ranitidine, Simvastatin, Xanax Xr (alprazolam Er), Cholestatin, Clopidogrel Bisulfate           Current Medications (08/15/2021):  This is the current hospital active medication list Current Facility-Administered Medications  Medication Dose Route Frequency Provider Last Rate Last Admin   0.9 %  sodium chloride infusion   Intravenous Continuous [place] Eric J, DO       acetaminophen (TYLENOL) tablet 650 mg  650 mg Oral Q6H PRN Zierle-Ghosh, Asia B, DO       Or   acetaminophen (TYLENOL) suppository 650 mg  650 mg Rectal Q6H PRN Zierle-Ghosh, Asia B, DO       ALPRAZolam (XANAX) tablet 0.25 mg  0.25 mg Oral TID PRN Zierle-Ghosh, Asia B, DO       bisoprolol (ZEBETA) tablet 5 mg  5 mg Oral Daily Zierle-Ghosh, Asia B, DO   5 mg at 08/15/21 0846   digoxin (LANOXIN) tablet 0.0625 mg  0.0625 mg Oral Daily Zierle-Ghosh, Asia B, DO   0.0625 mg at 08/15/21 0847   heparin injection 5,000 Units  5,000 Units Subcutaneous Q8H Zierle-Ghosh, Asia B, DO   5,000 Units at 08/15/21 1406   insulin aspart (novoLOG) injection 0-15 Units  0-15 Units Subcutaneous TID WC Zierle-Ghosh, Asia B, DO   8 Units at 08/15/21 1212   insulin aspart (novoLOG) injection 0-5 Units  0-5 Units Subcutaneous QHS Zierle-Ghosh, Asia B, DO       insulin glargine-yfgn (SEMGLEE) injection 34 Units  34 Units Subcutaneous QHS Zierle-Ghosh, Asia B, DO       isosorbide mononitrate (IMDUR) 24 hr tablet 15 mg  15 mg Oral Daily Zierle-Ghosh, Asia B, DO   15 mg at 08/15/21 0846   morphine 2 MG/ML injection 2 mg  2 mg Intravenous Q2H PRN Zierle-Ghosh, Asia B, DO       nortriptyline (PAMELOR) capsule 10 mg  10 mg Oral QHS Zierle-Ghosh, Asia B, DO   10 mg at 08/15/21 0314   OLANZapine (ZYPREXA) tablet 2.5 mg  2.5 mg Oral BID Zierle-Ghosh, Asia B, DO   2.5 mg at 08/15/21 0846   ondansetron (ZOFRAN) tablet 4 mg  4 mg Oral Q6H  PRN Zierle-Ghosh, Asia B, DO       Or   ondansetron (ZOFRAN) injection 4 mg  4 mg Intravenous Q6H PRN Zierle-Ghosh, Asia B, DO       oxyCODONE (Oxy IR/ROXICODONE) immediate release tablet 5 mg  5 mg Oral Q4H PRN Zierle-Ghosh, Asia B, DO       rosuvastatin (CRESTOR) tablet 5 mg  5 mg Oral QHS Zierle-Ghosh, Asia B, DO       [START ON 08/16/2021] spironolactone (ALDACTONE) tablet 12.5 mg  12.5 mg Oral QHS [place] Donnamarie Poag, DO         Discharge Medications: Please see discharge summary for a list of discharge medications.  Relevant Imaging Results:  Relevant Lab Results:   Additional Information SSN: [id number]  Iona Beard, [place]

## 2021-08-15 NOTE — Evaluation (Signed)
Physical Therapy Evaluation Patient Details Name: [name] [name] MRN: [id number] DOB: Sep 10, 1932 Today's Date: 08/15/2021  History of Present Illness  [name] [name] is a 87 year old male with past medical history significant for chronic systolic congestive heart failure, Alzheimer's dementia, paroxysmal atrial fibrillation, BPH, CAD, aortic stenosis s/p TAVR, type 2 diabetes mellitus, GERD, hyperlipidemia, CKD stage IIIb who presented to [facility] ED via EMS from home with weakness, inability to ambulate.  Patient's niece at bedside and gives most of history.  Review of EMR notable for advancing dementia, now reported end-stage by PCP.  Niece reports patient normally ambulates with a walker at baseline.  He has been progressively getting more weak and his home health aide was unable to get him up and get into the bath.  Patient currently lives alone at home with assistance from a home health aide occasionally.  Given his progressive disease and his inability to care for himself at home, patient was admitted for further evaluation, IV fluid hydration for dehydration and likely need of placement.  Clinical Impression  Present in room with niece who helped as primary historian. Agreeable to PT eval. Able to assist in bed mobility, but increased difficulty with balance and strength in sitting. Good progression within session in sit to stand progressing from requiring [facility] to [facility] and increasing glute/quad strength throughout. Discussed benefits of SNF to increase strength. Notified of sit to stand status. Patient will benefit from continued physical therapy in hospital and recommended venue below to increase strength, balance, endurance for safe ADLs and gait.      Recommendations for follow up therapy are one component of a multi-disciplinary discharge planning process, led by the attending physician.  Recommendations may be updated based on patient status, additional functional criteria and  insurance authorization.  Follow Up Recommendations SNF    Equipment Recommendations  None recommended by PT    Recommendations for Other Services       Precautions / Restrictions Precautions Precautions: Fall Restrictions Weight Bearing Restrictions: No      Mobility  Bed Mobility Overal bed mobility: Needs Assistance Bed Mobility: Rolling;Sidelying to Sit;Sit to Supine Rolling: Mod assist Sidelying to sit: Mod assist;Max assist   Sit to supine: Mod assist (PT took LEs)   General bed mobility comments: slow and labored    Transfers Overall transfer level: Needs assistance Equipment used: Rolling walker (2 wheeled) Transfers: Sit to/from Stand Sit to Stand: Mod assist;Max assist (x3 from EOB)         General transfer comment: slow and labored, verbal cues for pushing through knees and standing tall.  Ambulation/Gait                Stairs            Wheelchair Mobility    Modified Rankin (Stroke Patients Only)       Balance Overall balance assessment: Mild deficits observed, not formally tested                                           Pertinent Vitals/Pain Pain Assessment: Faces Pain Score: 3  Faces Pain Scale: Hurts a little bit Pain Location: back Pain Intervention(s): Monitored during session    Home Living Family/patient expects to be discharged to:: Skilled nursing facility  Prior Function Level of Independence: Needs assistance   Gait / Transfers Assistance Needed: hx RW at baseline, 24 hour care currentl;y  ADL's / Homemaking Assistance Needed: 24 hr caretaking        Hand Dominance        Extremity/Trunk Assessment   Upper Extremity Assessment Upper Extremity Assessment: Generalized weakness    Lower Extremity Assessment Lower Extremity Assessment: Generalized weakness    Cervical / Trunk Assessment Cervical / Trunk Assessment: Normal  Communication    Communication: HOH  Cognition Arousal/Alertness: Lethargic Behavior During Therapy: WFL for tasks assessed/performed Overall Cognitive Status: History of cognitive impairments - at baseline                                        General Comments      Exercises     Assessment/Plan    PT Assessment Patient needs continued PT services  PT Problem List Decreased strength;Decreased activity tolerance;Decreased balance;Decreased mobility;Decreased safety awareness       PT Treatment Interventions DME instruction;Gait training;Functional mobility training;Therapeutic activities;Therapeutic exercise;Balance training;Neuromuscular re-education;Patient/family education    PT Goals (Current goals can be found in the Care Plan section)  Acute Rehab PT Goals Patient Stated Goal: increase strength PT Goal Formulation: With patient/family Time For Goal Achievement: 08/29/21 Potential to Achieve Goals: Good    Frequency Min 3X/week   Barriers to discharge        Co-evaluation               AM-PAC PT "6 Clicks" Mobility  Outcome Measure Help needed turning from your back to your side while in a flat bed without using bedrails?: A Little Help needed moving from lying on your back to sitting on the side of a flat bed without using bedrails?: A Lot Help needed moving to and from a bed to a chair (including a wheelchair)?: A Lot Help needed standing up from a chair using your arms (e.g., wheelchair or bedside chair)?: A Lot Help needed to walk in hospital room?: A Lot Help needed climbing 3-5 steps with a railing? : Total 6 Click Score: 12    End of Session   Activity Tolerance: Patient tolerated treatment well;Patient limited by fatigue Patient left: in bed;with call bell/phone within reach;with family/visitor present Nurse Communication: Mobility status PT Visit Diagnosis: Unsteadiness on feet (R26.81);Other abnormalities of gait and mobility (R26.89);Muscle  weakness (generalized) (M62.81)    Time: 0938-1829 PT Time Calculation (min) (ACUTE ONLY): 20 min   Charges:   PT Evaluation $PT Eval Low Complexity: 1 Low PT Treatments $Therapeutic Activity: 8-22 mins       1:24 PM,08/15/21 Domenic Moras, PT, DPT Physical Therapist at [facility]

## 2021-08-15 NOTE — ED Notes (Signed)
Internal MD at bedside for eval.

## 2021-08-15 NOTE — Plan of Care (Signed)

## 2021-08-15 NOTE — Progress Notes (Signed)
PROGRESS NOTE    [name] [name]  [id number] DOB: August 26, 1932 DOA: 08/14/2021 PCP: Kathyrn Drown, MD    Brief Narrative:  [name] [name] is a 87 year old male with past medical history significant for chronic systolic congestive heart failure, Alzheimer's dementia, paroxysmal atrial fibrillation, BPH, CAD, aortic stenosis s/p TAVR, type 2 diabetes mellitus, GERD, hyperlipidemia, CKD stage IIIb who presented to [facility] ED via EMS from home with weakness, inability to ambulate.  Patient's niece at bedside and gives most of history.  Review of EMR notable for advancing dementia, now reported end-stage by PCP.  Niece reports patient normally ambulates with a walker at baseline.  He has been progressively getting more weak and his home health aide was unable to get him up and get into the bath.  Patient currently lives alone at home with assistance from a home health aide occasionally.  Given his progressive disease and his inability to care for himself at home, patient was admitted for further evaluation, IV fluid hydration for dehydration and likely need of placement.  In the ED, temperature 97.5 F, HR 75, RR 20, BP 136/68, SPO2 100% on room air.  Sodium 131, potassium 4.9, chloride 99, CO2 22, glucose 286, BUN 29, creatinine 1.25.  Lactic acid 2.5.  WBC 7.8, hemoglobin 15.5, platelets 167.  Digoxin 0.2.  COVID-19 PCR negative.  Influenza A/B PCR negative.  Urinalysis unrevealing.  Chest x-ray with no acute cardiopulmonary disease process.  EKG with atrial fibrillation, rate 72, LBBB, QTC 428, no concerning dynamic changes; unchanged in comparison to prior tracing.  Patient was started on IV fluid hydration.  Duration consulted for further evaluation and management of dehydration, lactic acidosis, and generalized weakness/debility.   Assessment & Plan:   Active Problems:   DM (diabetes mellitus), type 2 with renal complications (HCC)   Dementia (HCC)   Generalized  weakness   Generalized weakness, debility, deconditioning: Dehydration: Patient presenting to the ED with acute weakness, in the setting of progressive generalized debility, deconditioning, and weakness.  Patient with underlying end-stage Alzheimer's dementia which is likely significant contributing factor.  Patient with notable elevation of lactic acid of 2.5 likely secondary to dehydration from poor oral intake.  Patient is afebrile without leukocytosis, LFTs within normal limits; unlikely infectious etiology.  Patient currently living alone with intermittent use of aids.  Given his advanced dementia and generalized weakness, unsafe for discharge home will need placement. --PT/OT evaluation --lactic acid 2.5>2.1 --Continue IV fluid hydration with NS at 75 mL/h --nutrition consult --repeat lactic acid in the am. --TOC for likely need of placement  CKD stage IIIb Creatinine 1.25 on admission.  Creatinine baseline 1.1-1.2.  Patient with reported poor oral intake in the preceding days. --Cr 1.25>1.10 --Avoid nephrotoxins, renally dose all medications --IVF w/ NS at 2mL/hr --Encourage increase oral intake --BMP in am  Chronic systolic congestive heart failure, compensated TTE 05/04/2020 with LVEF 35-40%, LV with moderate decreased function with global hypokinesis, increased PASP, LA severely dilated, RA moderately dilated, moderate MR, noted prosthetic TAVR, IVC within normal limits.  Follows with cardiology outpatient, Dr. Haroldine Laws. --Bisoprolol 2.5 mg p.o. daily --Digoxin 0.125 mg p.o. daily --Spironolactone 12.5 mg p.o. nightly (start PM 10/3) --Imdur 50 mg p.o. daily --Not on ARNi/ARB as BP cannot tolerate --Strict I's and O's and daily weights --Outpatient follow-up with cardiology  Hx severe AS s/p TAVR Follows with cardiology, [name] outpatient.  TTE 2021 with prosthetic TAVR valve present in the aortic position. --Outpatient follow-up with cardiology  CAD --Crestor 5 mg  p.o. daily --Not on aspirin, continues on warfarin  Chronic atrial fibrillation --Continue rate control with bisoprolol 2.5 mg p.o. daily --Continue warfarin, pharmacy consulted for dosing/monitoring  Type 2 diabetes mellitus, with hyperglycemia Home regimen includes Lantus 34 units subcutaneously daily, metformin 250mg  PO BID. hemoglobin A1c 8.6 on 04/22/2021, not optimally controlled. --Hold oral hypoglycemics while inpatient --Semglee 34u [place] qHS --Moderate SSI for coverage --CBGs qAC/HS  End-stage Alzheimer's dementia --Olanzapine 2.5 mg p.o. twice daily --Xanax 0.25 mg p.o. 3 times daily as needed anxiety   DVT prophylaxis: heparin injection 5,000 Units Start: 08/15/21 0600 SCDs Start: 08/15/21 0143   Code Status: DNR Family Communication: Updated patient's niece, [name] ([place]) via telephone this morning  Disposition Plan:  Level of care: Med-Surg Status is: Observation  The patient remains OBS appropriate and will d/c before 2 midnights.  Dispo: The patient is from: Home              Anticipated d/c is to: SNF              Patient currently is medically stable to d/c.   Difficult to place patient No   Consultants:  none  Procedures:  none  Antimicrobials:  none   Subjective: Patient seen examined at bedside, resting comfortably.  Pleasantly confused.  No family present.  States "need to get back home".  Unable to further obtain history given his underlying dementia.  But denies headache, no chest pain, no shortness of breath, no abdominal pain.  No acute events overnight per nursing staff.  Awaiting PT/OT evaluation, likely needs SNF placement is unsafe to return home alone.  Objective: Vitals:   08/15/21 0800 08/15/21 0815 08/15/21 0830 08/15/21 0845  BP: 136/63  (!) 159/62   Pulse: 66 64 67 65  Resp: (!) 21 15 16 14   Temp:      TempSrc:      SpO2: 97% 98% 98% 97%  Weight:      Height:       No intake or output data in the 24 hours ending 08/15/21  1053 Filed Weights   08/14/21 1736  Weight: 95.3 kg    Examination:  General exam: Appears calm and comfortable, elderly in appearance, pleasantly confused Respiratory system: Clear to auscultation. Respiratory effort normal.  On room air Cardiovascular system: S1 & S2 heard, irregularly irregular rhythm, normal rate. No JVD, murmurs, rubs, gallops or clicks. No pedal edema. Gastrointestinal system: Abdomen is nondistended, soft and nontender. No organomegaly or masses felt. Normal bowel sounds heard. Central nervous system: Alert, oriented to place (hospital), but not time (2012), person Johnella Moloney is person who ran against Trump"), or situation. No focal neurological deficits. Extremities: Symmetric 5 x 5 power. Skin: No rashes, lesions or ulcers Psychiatry: Judgement and insight appear poor. Mood & affect appropriate.     Data Reviewed: I have personally reviewed following labs and imaging studies  CBC: Recent Labs  Lab 08/14/21 1751 08/15/21 0541  WBC 7.8 8.1  NEUTROABS  --  6.1  HGB 15.5 14.8  HCT 45.4 43.9  MCV 93.4 93.4  PLT 167 751   Basic Metabolic Panel: Recent Labs  Lab 08/14/21 1751 08/15/21 0541  NA 131* 136  K 4.9 4.2  CL 99 103  CO2 22 26  GLUCOSE 286* 251*  BUN 29* 24*  CREATININE 1.25* 1.10  CALCIUM 9.0 9.1  MG  --  1.8   GFR: Estimated Creatinine Clearance: 53.5 mL/min (by C-G formula based on  SCr of 1.1 mg/dL). Liver Function Tests: Recent Labs  Lab 08/15/21 0541  AST 24  ALT 25  ALKPHOS 80  BILITOT 0.8  PROT 7.1  ALBUMIN 3.8   No results for input(s): LIPASE, AMYLASE in the last 168 hours. No results for input(s): AMMONIA in the last 168 hours. Coagulation Profile: No results for input(s): INR, PROTIME in the last 168 hours. Cardiac Enzymes: No results for input(s): CKTOTAL, CKMB, CKMBINDEX, TROPONINI in the last 168 hours. BNP (last 3 results) No results for input(s): PROBNP in the last 8760 hours. HbA1C: No results for  input(s): HGBA1C in the last 72 hours. CBG: Recent Labs  Lab 08/15/21 0744  GLUCAP 208*   Lipid Profile: No results for input(s): CHOL, HDL, LDLCALC, TRIG, CHOLHDL, LDLDIRECT in the last 72 hours. Thyroid Function Tests: No results for input(s): TSH, T4TOTAL, FREET4, T3FREE, THYROIDAB in the last 72 hours. Anemia Panel: No results for input(s): VITAMINB12, FOLATE, FERRITIN, TIBC, IRON, RETICCTPCT in the last 72 hours. Sepsis Labs: Recent Labs  Lab 08/14/21 1839 08/15/21 0541  LATICACIDVEN 2.5* 1.7    Recent Results (from the past 240 hour(s))  Resp Panel by RT-PCR (Flu A&B, Covid) Nasopharyngeal Swab     Status: None   Collection Time: 08/14/21  6:23 PM   Specimen: Nasopharyngeal Swab; Nasopharyngeal(NP) swabs in vial transport medium  Result Value Ref Range Status   SARS Coronavirus 2 by RT PCR NEGATIVE NEGATIVE Final    Comment: (NOTE) SARS-CoV-2 target nucleic acids are NOT DETECTED.  The SARS-CoV-2 RNA is generally detectable in upper respiratory specimens during the acute phase of infection. The lowest concentration of SARS-CoV-2 viral copies this assay can detect is 138 copies/mL. A negative result does not preclude SARS-Cov-2 infection and should not be used as the sole basis for treatment or other patient management decisions. A negative result may occur with  improper specimen collection/handling, submission of specimen other than nasopharyngeal swab, presence of viral mutation(s) within the areas targeted by this assay, and inadequate number of viral copies(<138 copies/mL). A negative result must be combined with clinical observations, patient history, and epidemiological information. The expected result is Negative.  Fact Sheet for Patients:  EntrepreneurPulse.com.au  Fact Sheet for Healthcare Providers:  IncredibleEmployment.be  This test is no t yet approved or cleared by the [place] FDA and  has been authorized  for detection and/or diagnosis of SARS-CoV-2 by FDA under an Emergency Use Authorization (EUA). This EUA will remain  in effect (meaning this test can be used) for the duration of the COVID-19 declaration under Section 564(b)(1) of the Act, 21 U.S.C.section 360bbb-3(b)(1), unless the authorization is terminated  or revoked sooner.       Influenza A by PCR NEGATIVE NEGATIVE Final   Influenza B by PCR NEGATIVE NEGATIVE Final    Comment: (NOTE) The Xpert Xpress SARS-CoV-2/FLU/RSV plus assay is intended as an aid in the diagnosis of influenza from Nasopharyngeal swab specimens and should not be used as a sole basis for treatment. Nasal washings and aspirates are unacceptable for Xpert Xpress SARS-CoV-2/FLU/RSV testing.  Fact Sheet for Patients: EntrepreneurPulse.com.au  Fact Sheet for Healthcare Providers: IncredibleEmployment.be  This test is not yet approved or cleared by the [place] FDA and has been authorized for detection and/or diagnosis of SARS-CoV-2 by FDA under an Emergency Use Authorization (EUA). This EUA will remain in effect (meaning this test can be used) for the duration of the COVID-19 declaration under Section 564(b)(1) of the Act, 21 U.S.C. section 360bbb-3(b)(1), unless  the authorization is terminated or revoked.  Performed at [facility], [address] [place], [place] [address]          Radiology Studies: DG Chest Portable 1 View  Result Date: 08/14/2021 CLINICAL DATA:  Weakness.  Hoarse voice. EXAM: PORTABLE CHEST 1 VIEW COMPARISON:  Chest radiograph 07/13/2021. FINDINGS: Monitoring leads overlie the patient. Stable cardiac and mediastinal contours. No consolidative pulmonary opacities. No pleural effusion or pneumothorax. IMPRESSION: No acute cardiopulmonary process. Electronically Signed   By: Lovey Newcomer M.D.   On: 08/14/2021 19:13        Scheduled Meds:  bisoprolol  5 mg Oral Daily   digoxin  0.0625 mg  Oral Daily   heparin  5,000 Units Subcutaneous Q8H   insulin aspart  0-15 Units Subcutaneous TID WC   insulin aspart  0-5 Units Subcutaneous QHS   insulin glargine-yfgn  34 Units Subcutaneous QHS   isosorbide mononitrate  15 mg Oral Daily   nortriptyline  10 mg Oral QHS   OLANZapine  2.5 mg Oral BID   rosuvastatin  5 mg Oral QHS   spironolactone  12.5 mg Oral QHS   Continuous Infusions:  sodium chloride 75 mL/hr at 08/15/21 0240     LOS: 0 days    Time spent: 42 minutes spent on chart review, discussion with nursing staff, consultants, updating family and interview/physical exam; more than 50% of that time was spent in counseling and/or coordination of care.    Yohance Hathorne J [place] DO [facility] Available via Epic secure chat 7am-7pm After these hours, please refer to coverage provider listed on [email or web address] 08/15/2021, 10:53 AM

## 2021-08-15 NOTE — H&P (Signed)
TRH H&P    Patient Demographics:    [name] [name], is a 87 y.o. male  MRN: [id number]  DOB - Feb 07, 1932  Admit Date - 08/14/2021  Referring MD/NP/PA: Sabra Heck  Outpatient Primary MD for the patient is Kathyrn Drown, MD  Patient coming from: Home  Chief complaint-generalized weakness   HPI:    [name] [name]  is a 87 y.o. male, with history of CHF, dementia, atrial fibrillation, BPH, coronary artery disease, cerebrovascular disease, diabetes mellitus type 2, GERD, hyperlipidemia, renal insufficiency, status post TAVR, and more presents the ED with a chief complaint of generalized weakness.  Niece is at bedside and she gives most of the history.  Patient's dementia prohibits him from participating in much of the history.  He is however pleasant, and oriented to context and person, getting slightly confused with time and repeating the same questions over and over.  Niece reports that patient normally ambulates with walker.  He does have a wheelchair at home for when he is having an off day.  Patient has been getting progressively more weak, but then had a sudden change today.  His home health aide was able to get him up and get him to the bath.  He got weaker throughout the day and around 3 PM he could not transfer from commode to chair.  EMS had to come and physically lift him out.  He has not had any complaints of fevers, pain, nausea/vomiting, diarrhea, dysuria, or other.  He has had a normal appetite and normal p.o. intake of fluids as well.  Reports the patient is too weak to be taking care of at home.  He does live by himself and only occasionally has home health aide.  Due to lack of safe discharge plan, patient will need placement.  Patient does not smoke, does not drink alcohol, is vaccinated for COVID.  Patient is DNR.  In the ED Temp 97.5, heart rate 60-75, respiratory rate 13-32, blood pressure 162/59, satting  at 100% White blood cell count 7.8, hemoglobin 15.5 Chemistry panel reveals a hyperglycemia and a slight bump in creatinine very close to patient's baseline Lactic acid 2.5, repeat pending Respiratory panel negative UA shows glucosuria, not indicative of UTI Chest x-ray shows no acute cardiopulmonary disease    Review of systems:    Unfortunately patient is unable to provide review of systems secondary to dementia.    Past History of the following :    Past Medical History:  Diagnosis Date   Acute on chronic systolic CHF (congestive heart failure) ([place]) 02/09/2017   Acute respiratory failure with hypoxia (HCC)    AKI (acute kidney injury) ([place]) 08/20/2015   Altered mental status 02/21/2017   Aortic stenosis    a. mod-sev by echo 05/2016.   Asthma    Asthma exacerbation 08/20/2015   Atrial fibrillation (HCC)    Paroxysmal; Echo in 2007-normal EF; mild LVH; LA enlargement; mild AS, minimal AI; neg. stress nuclear study in 2008    BPH (benign prostatic hypertrophy) with urinary obstruction 09/27/2011   Transurethral  resection of the prostate in 09/2011    CAD (coronary artery disease), native coronary artery 06/28/2016   Cardiogenic shock ([place])    Carotid stenosis 04/25/2012   Carotid US 9/21: Bilateral ICA 1-39   Cerebrovascular disease 07/2010   TIA; carotid ultrasound in 07/2010-significant bilateral plaque without focal internal carotid artery stenosis; MRI -encephalomalacia left temporal and right temporal lobes; small inferior right cerebellar infarct; small vessel disease   Chronic atrial fibrillation (HCC)    Paroxysmal; Echocardiogram in 2007-normal EF; mild LVH; left atrial enlargement; mild stenosis and minimal AI; negative stress nuclear study in 1025   Chronic systolic CHF (congestive heart failure) ([place])    a. dx 05/2016 - EF 35-40%, diffuse HK, mod-severe AS, mod gradient, severe AVA VTI likely due to decreased cardiac output in setting of systolic dysfsunction and  significant mitral regurgitation, mild MR, mod-severe MR, severe LAE, mild-mod RV dilation, mild RAE, mild-mod TR, mod PASP 25mmHg.   CKD (chronic kidney disease), stage II    Cognitive dysfunction 05/29/2015   I believe that this is related to his age as well as previous stroke    Congestive heart failure (CHF) ([place]) 02/09/2017   Degenerative joint disease    feet and legs   Diabetes mellitus    no insulin; A1c of 6.6 in 2005   Dizziness    occurs daily,especially in am   Elevated troponin    Exertional dyspnea    Gastroesophageal reflux disease    Hepatic steatosis    History of noncompliance with medical treatment    Hyperlipidemia    adverse reactions to statins and niacin   Hypertension    Borderline   Irregular heartbeat    Mitral regurgitation    a. mod-sev by echo 05/2016   Peripheral vascular disease ([place])    Renal insufficiency    S/P TAVR (transcatheter aortic valve replacement) 07/19/2016   29 mm Edwards Sapien 3 transcatheter heart valve placed via left percutaneous transfemoral approach   Senile purpura ([place]) 08/15/2018   Temporal arteritis (HCC)    Tricuspid regurgitation    a. mild-mod TR by echo 05/2016   VT (ventricular tachycardia)       Past Surgical History:  Procedure Laterality Date   COLONOSCOPY  2002   COLONOSCOPY  01/19/2012   Procedure: COLONOSCOPY;  Surgeon: Rogene Houston, MD;  [facility];  Service: Endoscopy;  Laterality: N/A;  [place]   Right   PERIPHERAL VASCULAR CATHETERIZATION N/A 07/11/2016   Procedure: Carotid PTA/Stent Intervention;  Surgeon: Lorretta Harp, MD;  Location: [facility];  Service: Cardiovascular;  Laterality: N/A;   PROSTATE SURGERY  12/2011   RIGHT HEART CATH AND CORONARY ANGIOGRAPHY N/A 05/11/2020   Procedure: RIGHT HEART CATH AND CORONARY ANGIOGRAPHY;  Surgeon: Nelva Bush, MD;  Location: [facility];  Service: Cardiovascular;  Laterality: N/A;    ROTATOR CUFF REPAIR     Right   TEE WITHOUT CARDIOVERSION N/A 06/17/2016   Procedure: TRANSESOPHAGEAL ECHOCARDIOGRAM (TEE);  Surgeon: Jerline Pain, MD;  Location: [facility] ENDOS[facility];  Service: Cardiovascular;  Laterality: N/A;   TEE WITHOUT CARDIOVERSION N/A 07/19/2016   Procedure: TRANSESOPHAGEAL ECHOCARDIOGRAM (TEE);  Surgeon: Sherren Mocha, MD;  Location: [facility];  Service: Open Heart Surgery;  Laterality: N/A;   TRANSCATHETER AORTIC VALVE REPLACEMENT, TRANSFEMORAL N/A 07/19/2016   Procedure: TRANSCATHETER AORTIC VALVE REPLACEMENT, TRANSFEMORAL;  Surgeon: Sherren Mocha, MD;  Location: [facility];  Service: Open Heart Surgery;  Laterality: N/A;   TRANSURETHRAL RESECTION OF PROSTATE  09/2011   URETHRAL STRICTURE DILATATION  1980s      Social History:      Social History   Tobacco Use   Smoking status: Former    Packs/day: 1.00    Years: 20.00    Pack years: 20.00    Types: Cigarettes    Start date: 07/04/1950    Quit date: 04/25/1992    Years since quitting: 29.3   Smokeless tobacco: Current    Types: Chew  Substance Use Topics   Alcohol use: No    Alcohol/week: 0.0 standard drinks       Family History :     Family History  Problem Relation Age of Onset   Stroke Mother    Diabetes Father    Colon cancer Neg Hx       Home Medications:   Prior to Admission medications   Medication Sig Start Date End Date Taking? Authorizing Provider  acetaminophen (TYLENOL) 325 MG tablet Take 650 mg by mouth every 6 (six) hours as needed for mild pain or moderate pain.   Yes [provider]  ALPRAZolam (XANAX) 0.25 MG tablet TAKE (1) TABLET BY MOUTH THREE TIMES DAILY AS NEEDED FOR ANXIETY. Patient taking differently: Take 0.25 mg by mouth 3 (three) times daily as needed for anxiety. 01/05/21  Yes Luking, Elayne Snare, MD  bisoprolol (ZEBETA) 5 MG tablet TAKE 0.5 TABLET (2.5MG  TOTAL) BY MOUTH ONCE DAILY. 07/06/21  Yes Kathyrn Drown, MD  cholecalciferol (VITAMIN D3) 25 MCG (1000 UNIT) tablet  Take 1,000 Units by mouth daily.   Yes [provider]  digoxin (LANOXIN) 0.125 MG tablet TAKE (1/2) TABLET BY MOUTH ONCE DAILY. Patient taking differently: Take 0.0625 mg by mouth daily. 02/17/21  Yes Bensimhon, Shaune Pascal, MD  furosemide (LASIX) 20 MG tablet Take 1 tablet (20 mg total) by mouth 3 (three) times a week. Every Mon, Wed, and Fri, can take on other days as needed 01/08/21  Yes Bensimhon, Shaune Pascal, MD  isosorbide mononitrate (IMDUR) 30 MG 24 hr tablet Take 15 mg by mouth daily. 05/26/20  Yes [provider]  LANTUS SOLOSTAR 100 UNIT/ML Solostar Pen INJECT 34 UNITS EVERY EVENING. Patient taking differently: 42 Units. 01/20/21  Yes Kathyrn Drown, MD  metFORMIN (GLUCOPHAGE) 500 MG tablet Take 1/2 tablet po twice a day Patient taking differently: Take 500 mg by mouth 2 (two) times daily. Take 1/2 tablet po twice a day 04/26/21  Yes Luking, Scott A, MD  Methylcobalamin (B12-ACTIVE PO) Take 500 mg by mouth daily.   Yes [provider]  nortriptyline (PAMELOR) 10 MG capsule TAKE 1 CAPSULES AT BEDTIME FOR BURNING IN FEET.[28 IN PACKS/ NONE IN BOTTLE] Patient taking differently: Take 10 mg by mouth at bedtime. TAKE 1 CAPSULES AT BEDTIME FOR BURNING IN FEET.[28 IN PACKS/ NONE IN BOTTLE] 12/22/20  Yes Luking, Elayne Snare, MD  OLANZapine (ZYPREXA) 2.5 MG tablet One tablet twice a day Patient taking differently: Take 2.5 mg by mouth 2 (two) times daily. One tablet twice a day 05/26/21  Yes Luking, Scott A, MD  psyllium (METAMUCIL) 58.6 % powder Take 1 packet by mouth daily as needed.   Yes [provider]  rosuvastatin (CRESTOR) 5 MG tablet TAKE (1) TABLET BY MOUTH ONCE DAILY AT BEDTIME. 02/15/21  Yes Luking, Elayne Snare, MD  spironolactone (ALDACTONE) 25 MG tablet TAKE 1/2 TABLET BY MOUTH ONCE DAILY. Patient taking differently: Take 12.5  mg by mouth at bedtime. 07/06/21  Yes Bensimhon, Shaune Pascal, MD  TRUEPLUS PEN NEEDLES 31G X 8 MM MISC USE WITH LANTUS SOLOSTAR EVERY EVENING 01/04/21    Kathyrn Drown, MD     Allergies:     Allergies  Allergen Reactions   Ambien [Zolpidem Tartrate] Other (See Comments)    Sleep walks   Lipitor [Atorvastatin Calcium] Other (See Comments)    myalgias   Ranitidine Other (See Comments)    Chest discomfort   Simvastatin Other (See Comments)    Myalgias   Xanax Xr [Alprazolam Er] Other (See Comments)    Tightness in chest   Cholestatin Other (See Comments)    UNSPECIFIED REACTION    Clopidogrel Bisulfate Rash     Physical Exam:   Vitals  Blood pressure (!) 132/56, pulse 62, temperature (!) 97.5 F (36.4 C), temperature source Oral, resp. rate 16, height 6\' 1"  (1.854 m), weight 95.3 kg, SpO2 99 %.  1.  General: Patient lying supine in bed,  no acute distress   2. Psychiatric: Alert and oriented to person and context, mood and behavior normal for situation, pleasant and cooperative with exam   3. Neurologic: Speech and language are normal, face is symmetric, moves all 4 extremities voluntarily, at baseline without acute deficits on limited exam   4. HEENMT:  Head is atraumatic, normocephalic, pupils reactive to light, neck is supple, trachea is midline, mucous membranes are moist   5. Respiratory : Lungs are clear to auscultation bilaterally without wheezing, rhonchi, rales, no cyanosis, no increase in work of breathing or accessory muscle use   6. Cardiovascular : Heart rate normal, rhythm is regular, murmur present, rubs or gallops, no peripheral edema, peripheral pulses palpated   7. Gastrointestinal:  Abdomen is soft, nondistended, nontender to palpation bowel sounds active, no masses or organomegaly palpated   8. Skin:  Skin is warm, dry and intact without rashes, acute lesions, or ulcers on limited exam   9.Musculoskeletal:  No acute deformities or trauma, no asymmetry in tone, no peripheral edema, peripheral pulses palpated, no tenderness to palpation in the extremities     Data Review:    CBC Recent  Labs  Lab 08/14/21 1751  WBC 7.8  HGB 15.5  HCT 45.4  PLT 167  MCV 93.4  MCH 31.9  MCHC 34.1  RDW 13.7   ------------------------------------------------------------------------------------------------------------------  Results for orders placed or performed during the hospital encounter of 08/14/21 (from the past 48 hour(s))  Basic metabolic panel     Status: Abnormal   Collection Time: 08/14/21  5:51 PM  Result Value Ref Range   Sodium 131 (L) 135 - 145 mmol/L   Potassium 4.9 3.5 - 5.1 mmol/L   Chloride 99 98 - 111 mmol/L   CO2 22 22 - 32 mmol/L   Glucose, Bld 286 (H) 70 - 99 mg/dL    Comment: Glucose reference range applies only to samples taken after fasting for at least 8 hours.   BUN 29 (H) 8 - 23 mg/dL   Creatinine, Ser 1.25 (H) 0.61 - 1.24 mg/dL   Calcium 9.0 8.9 - 10.3 mg/dL   GFR, Estimated 56 (L) >60 mL/min    Comment: (NOTE) Calculated using the CKD-EPI Creatinine Equation (2021)    Anion gap 10 5 - 15    Comment: Performed at [facility], [address] [place], [place] [address]  CBC     Status: None   Collection Time: 08/14/21  5:51 PM  Result Value  Ref Range   WBC 7.8 4.0 - 10.5 K/uL   RBC 4.86 4.22 - 5.81 MIL/uL   Hemoglobin 15.5 13.0 - 17.0 g/dL   HCT 45.4 39.0 - 52.0 %   MCV 93.4 80.0 - 100.0 fL   MCH 31.9 26.0 - 34.0 pg   MCHC 34.1 30.0 - 36.0 g/dL   RDW 13.7 11.5 - 15.5 %   Platelets 167 150 - 400 K/uL   nRBC 0.0 0.0 - 0.2 %    Comment: Performed at [facility], [address] [place], [place] [address]  Resp Panel by RT-PCR (Flu A&B, Covid) Nasopharyngeal Swab     Status: None   Collection Time: 08/14/21  6:23 PM   Specimen: Nasopharyngeal Swab; Nasopharyngeal(NP) swabs in vial transport medium  Result Value Ref Range   SARS Coronavirus 2 by RT PCR NEGATIVE NEGATIVE    Comment: (NOTE) SARS-CoV-2 target nucleic acids are NOT DETECTED.  The SARS-CoV-2 RNA is generally detectable in upper respiratory specimens during the acute phase of  infection. The lowest concentration of SARS-CoV-2 viral copies this assay can detect is 138 copies/mL. A negative result does not preclude SARS-Cov-2 infection and should not be used as the sole basis for treatment or other patient management decisions. A negative result may occur with  improper specimen collection/handling, submission of specimen other than nasopharyngeal swab, presence of viral mutation(s) within the areas targeted by this assay, and inadequate number of viral copies(<138 copies/mL). A negative result must be combined with clinical observations, patient history, and epidemiological information. The expected result is Negative.  Fact Sheet for Patients:  EntrepreneurPulse.com.au  Fact Sheet for Healthcare Providers:  IncredibleEmployment.be  This test is no t yet approved or cleared by the [place] FDA and  has been authorized for detection and/or diagnosis of SARS-CoV-2 by FDA under an Emergency Use Authorization (EUA). This EUA will remain  in effect (meaning this test can be used) for the duration of the COVID-19 declaration under Section 564(b)(1) of the Act, 21 U.S.C.section 360bbb-3(b)(1), unless the authorization is terminated  or revoked sooner.       Influenza A by PCR NEGATIVE NEGATIVE   Influenza B by PCR NEGATIVE NEGATIVE    Comment: (NOTE) The Xpert Xpress SARS-CoV-2/FLU/RSV plus assay is intended as an aid in the diagnosis of influenza from Nasopharyngeal swab specimens and should not be used as a sole basis for treatment. Nasal washings and aspirates are unacceptable for Xpert Xpress SARS-CoV-2/FLU/RSV testing.  Fact Sheet for Patients: EntrepreneurPulse.com.au  Fact Sheet for Healthcare Providers: IncredibleEmployment.be  This test is not yet approved or cleared by the [place] FDA and has been authorized for detection and/or diagnosis of SARS-CoV-2 by FDA  under an Emergency Use Authorization (EUA). This EUA will remain in effect (meaning this test can be used) for the duration of the COVID-19 declaration under Section 564(b)(1) of the Act, 21 U.S.C. section 360bbb-3(b)(1), unless the authorization is terminated or revoked.  Performed at [facility], [address], [place], [place] [address]   Lactic acid, plasma     Status: Abnormal   Collection Time: 08/14/21  6:39 PM  Result Value Ref Range   Lactic Acid, Venous 2.5 (HH) 0.5 - 1.9 mmol/L    Comment: CRITICAL RESULT CALLED TO, READ BACK BY AND VERIFIED WITH: RAND,K 1923 08/14/2021 [place],R Performed at [facility], [address] [place], [place] [address]   Digoxin level     Status: Abnormal   Collection Time: 08/14/21  6:39 PM  Result Value Ref Range   Digoxin Level 0.2 (L) 0.8 - 2.0 ng/mL    Comment: Performed at [facility], [address], [place], [place] [address]  Urinalysis, Routine w reflex microscopic Urine, Clean Catch     Status: Abnormal   Collection Time: 08/14/21  9:00 PM  Result Value Ref Range   Color, Urine STRAW (A) YELLOW   APPearance CLEAR CLEAR   Specific Gravity, Urine 1.009 1.005 - 1.030   pH 6.0 5.0 - 8.0   Glucose, UA >=500 (A) NEGATIVE mg/dL   Hgb urine dipstick NEGATIVE NEGATIVE   Bilirubin Urine NEGATIVE NEGATIVE   Ketones, ur NEGATIVE NEGATIVE mg/dL   Protein, ur NEGATIVE NEGATIVE mg/dL   Nitrite NEGATIVE NEGATIVE   Leukocytes,Ua NEGATIVE NEGATIVE   RBC / HPF 0-5 0 - 5 RBC/hpf   WBC, UA 0-5 0 - 5 WBC/hpf   Bacteria, UA NONE SEEN NONE SEEN    Comment: Performed at [facility], [address], [name], [place] [address]    Chemistries  Recent Labs  Lab 08/14/21 1751  NA 131*  K 4.9  CL 99  CO2 22  GLUCOSE 286*  BUN 29*  CREATININE 1.25*  CALCIUM 9.0    ------------------------------------------------------------------------------------------------------------------  ------------------------------------------------------------------------------------------------------------------ GFR: Estimated Creatinine Clearance: 47.1 mL/min (A) (by C-G formula based on SCr of 1.25 mg/dL (H)). Liver Function Tests: No results for input(s): AST, ALT, ALKPHOS, BILITOT, PROT, ALBUMIN in the last 168 hours. No results for input(s): LIPASE, AMYLASE in the last 168 hours. No results for input(s): AMMONIA in the last 168 hours. Coagulation Profile: No results for input(s): INR, PROTIME in the last 168 hours. Cardiac Enzymes: No results for input(s): CKTOTAL, CKMB, CKMBINDEX, TROPONINI in the last 168 hours. BNP (last 3 results) No results for input(s): PROBNP in the last 8760 hours. HbA1C: No results for input(s): HGBA1C in the last 72 hours. CBG: No results for input(s): GLUCAP in the last 168 hours. Lipid Profile: No results for input(s): CHOL, HDL, LDLCALC, TRIG, CHOLHDL, LDLDIRECT in the last 72 hours. Thyroid Function Tests: No results for input(s): TSH, T4TOTAL, FREET4, T3FREE, THYROIDAB in the last 72 hours. Anemia Panel: No results for input(s): VITAMINB12, FOLATE, FERRITIN, TIBC, IRON, RETICCTPCT in the last 72 hours.  --------------------------------------------------------------------------------------------------------------- Urine analysis:    Component Value Date/Time   COLORURINE STRAW (A) 08/14/2021 2100   APPEARANCEUR CLEAR 08/14/2021 2100   LABSPEC 1.009 08/14/2021 2100   PHURINE 6.0 08/14/2021 2100   GLUCOSEU >=500 (A) 08/14/2021 2100   HGBUR NEGATIVE 08/14/2021 2100   BILIRUBINUR NEGATIVE 08/14/2021 2100   BILIRUBINUR + 06/23/2014 [place] 08/14/2021 2100   PROTEINUR NEGATIVE 08/14/2021 2100   UROBILINOGEN 2.0 (A) 07/27/2017 1056   UROBILINOGEN 0.2 08/20/2015 2030   NITRITE NEGATIVE 08/14/2021 2100    LEUKOCYTESUR NEGATIVE 08/14/2021 2100      Imaging Results:    DG Chest Portable 1 View  Result Date: 08/14/2021 CLINICAL DATA:  Weakness.  Hoarse voice. EXAM: PORTABLE CHEST 1 VIEW COMPARISON:  Chest radiograph 07/13/2021. FINDINGS: Monitoring leads overlie the patient. Stable cardiac and mediastinal contours. No consolidative pulmonary opacities. No pleural effusion or pneumothorax. IMPRESSION: No acute cardiopulmonary process. Electronically Signed   By: Lovey Newcomer M.D.   On: 08/14/2021 19:13       Assessment & Plan:    Active Problems:   DM (diabetes mellitus), type 2 with renal complications (HCC)   Dementia (HCC)   Generalized weakness   Generalized weakness PT eval and treat TOC for  placement Infectious work-up negative Patient did have a lactic acidosis indicating very could be dehydrated, continue IV fluids, trend lactic acid Ultimately family does not think he is safe to be taking care of at home Dementia Continue Zyprexa, Pamelor, Xanax Diabetes mellitus type 2 Continue basal insulin, sliding scale coverage Update hemoglobin A1c CAD Continue Imdur and statin Continue Zebeta Atrial fibrillation Continue digoxin    DVT Prophylaxis-   Heparin- SCDs   AM Labs Ordered, also please review Full Orders  Family Communication: Admission, patients condition and plan of care including tests being ordered have been discussed with the patient and niece who indicate understanding and agree with the plan and Code Status.  Code Status: DNR  Admission status: Inpatient :The appropriate admission status for this patient is INPATIENT. Inpatient status is judged to be reasonable and necessary in order to provide the required intensity of service to ensure the patient's safety. The patient's presenting symptoms, physical exam findings, and initial radiographic and laboratory data in the context of their chronic comorbidities is felt to place them at high risk for further clinical  deterioration. Furthermore, it is not anticipated that the patient will be medically stable for discharge from the hospital within 2 midnights of admission. The following factors support the admission status of inpatient.     The patient's presenting symptoms include generalized weakness. The initial radiographic and laboratory data are worrisome because of lactic acidosis. The chronic co-morbidities include CAD, CHF, hypertension, hyperlipidemia, diabetes mellitus type 2, A. fib.       * I certify that at the point of admission it is my clinical judgment that the patient will require inpatient hospital care spanning beyond 2 midnights from the point of admission due to high intensity of service, high risk for further deterioration and high frequency of surveillance required.*  Time spent in minutes : [place]

## 2021-08-15 NOTE — TOC Initial Note (Signed)
Transition of Care [facility]) - Initial/Assessment Note    Patient Details  Name: [name] [name] MRN: [id number] Date of Birth: [birth year]  Transition of [facility]) CM/SW Contact:    Iona Beard, [place] Phone Number: 08/15/2021, 3:10 PM  Clinical Narrative:                 [facility] consulted for SNF placement. CSW spoke in room with pts nephew to complete assessment. Pt has a 24 hour caregiver in the home. Pt has assistance with all ADLs. Pt is able to use a walker in the home with assistance. Pt and family agreeable to SNF and prefer the [facility]. CSW to complete Fl2 and send out to local facilities for bed offers. TOC to follow.   Expected Discharge Plan: Skilled Nursing Facility Barriers to Discharge: Continued Medical Work up   Patient Goals and CMS Choice Patient states their goals for this hospitalization and ongoing recovery are:: Go to rehab CMS Medicare.gov Compare Post Acute Care list provided to:: Patient Represenative (must comment) Choice offered to / list presented to : [place] / Guardian  Expected Discharge Plan and Services Expected Discharge Plan: [place] In-house Referral: Clinical Social Work Discharge Planning Services: CM Consult Post Acute Care Choice: [place] Living arrangements for the past 2 months: [place]                                      Prior Living Arrangements/Services Living arrangements for the past 2 months: Single Family Home Lives with:: Self Patient language and need for interpreter reviewed:: Yes Do you feel safe going back to the place where you live?: Yes      Need for Family Participation in Patient Care: Yes (Comment) Care giver support system in place?: Yes (comment) Current home services: DME, Homehealth aide Criminal Activity/Legal Involvement Pertinent to Current Situation/Hospitalization: No - Comment as needed  Activities of Daily Living      Permission Sought/Granted                   Emotional Assessment Appearance:: Appears stated age Attitude/Demeanor/Rapport: Engaged Affect (typically observed): Accepting Orientation: : Oriented to Self, Oriented to Situation Alcohol / Substance Use: Not Applicable Psych Involvement: No (comment)  Admission diagnosis:  Dehydration [E86.0] Primary hypertension [I10] Generalized weakness [R53.1] Patient Active Problem List   Diagnosis Date Noted   Dehydration 08/15/2021   Generalized weakness 08/14/2021   Pulmonary fibrosis ([place]) 12/25/2020   Peripheral vascular disease, unspecified ([place]) 10/21/2020   Late onset Alzheimer's disease with behavioral disturbance ([place]) 06/30/2020   CKD stage 3 due to type 2 diabetes mellitus ([place]) 05/15/2020   Diabetic peripheral neuropathy associated with type 2 diabetes mellitus ([place]) 05/15/2020   Hyperlipidemia associated with type 2 diabetes mellitus ([place]) 05/15/2020   Dementia ([place]) 05/15/2020   Hypokalemia 05/15/2020   Chronic insomnia 05/15/2020   Acute on chronic HFrEF (heart failure with reduced ejection fraction) ([place]) 05/11/2020   Acute on chronic systolic heart failure, NYHA class 3 ([place]) 05/11/2020   Senile purpura ([place]) 08/15/2018   Renal insufficiency 03/14/2018   Altered mental status 32/20/2542   Chronic systolic heart failure ([place]) 02/09/2017   Controlled type 2 diabetes mellitus with hyperglycemia (HCC) 02/09/2017   Elevated troponin    S/P TAVR (transcatheter aortic valve replacement) 07/19/2016   Mitral regurgitation    VT (ventricular tachycardia)  Shortness of breath 06/28/2016   Coronary artery disease involving native coronary artery of native heart with angina pectoris (HCC)    Acute on chronic combined systolic and diastolic CHF, NYHA class 4 (HCC)    Anemia of chronic disease 04/12/2016   Orthostatic hypotension 08/20/2015   AKI (acute kidney injury) ([place]) 08/20/2015   Chronic asthma 08/20/2015   Cognitive dysfunction 05/29/2015   Chronic  back pain 07/29/2014   Lumbar pain 06/23/2014   Chronic pain of right ankle 05/30/2014   Diabetic peripheral neuropathy ([place]) 05/30/2014   Chest congestion 05/02/2013   Wheezing 05/02/2013   Chronic constipation 04/30/2013   Leg edema, left 04/27/2013   Current use of long term anticoagulation 02/16/2013   Carotid stenosis 04/25/2012   BPH (benign prostatic hypertrophy) with urinary obstruction 09/27/2011   Dyspnea on exertion 08/01/2011   DM (diabetes mellitus), type 2 with renal complications ([place]) 20/80/2233   Hypertension    Atrial fibrillation (HCC)    Hyperlipidemia    History of noncompliance with medical treatment    Aortic stenosis    Cerebrovascular disease 07/15/2010   PCP:  Kathyrn Drown, MD Pharmacy:   [facility], [place] [address] [place] [address] Phone: [phone] Fax: [phone]  [facility], [place] [address] [place] [address] Phone: [phone] Fax: [phone]     Social Determinants of Health (SDOH) Interventions    Readmission Risk Interventions No flowsheet data found.

## 2021-08-16 LAB — BASIC METABOLIC PANEL
Anion gap: 7 (ref 5–15)
BUN: 20 mg/dL (ref 8–23)
CO2: 24 mmol/L (ref 22–32)
Calcium: 8.9 mg/dL (ref 8.9–10.3)
Chloride: 105 mmol/L (ref 98–111)
Creatinine, Ser: 0.96 mg/dL (ref 0.61–1.24)
GFR, Estimated: >60 mL/min (ref >60)
Glucose, Bld: 206 mg/dL — ABNORMAL HIGH (ref 70–99)
Potassium: 4.1 mmol/L (ref 3.5–5.1)
Sodium: 136 mmol/L (ref 135–145)

## 2021-08-16 LAB — MAGNESIUM: Magnesium: 1.7 mg/dL (ref 1.7–2.4)

## 2021-08-16 LAB — URINE CULTURE: Culture: NO GROWTH

## 2021-08-16 LAB — GLUCOSE, CAPILLARY
Glucose-Capillary: 190 mg/dL — ABNORMAL HIGH (ref 70–99)
Glucose-Capillary: 261 mg/dL — ABNORMAL HIGH (ref 70–99)
Glucose-Capillary: 139 mg/dL — ABNORMAL HIGH (ref 70–99)
Glucose-Capillary: 93 mg/dL (ref 70–99)

## 2021-08-16 LAB — LACTIC ACID, PLASMA: Lactic Acid, Venous: 2.2 mmol/L (ref 0.5–1.9)

## 2021-08-16 MED ORDER — TRAZODONE HCL 50 MG PO TABS
50.0000 mg | ORAL_TABLET | Freq: Every evening | ORAL | Status: DC | PRN
  Administered 2021-08-16: 50 mg via ORAL
  Filled 2021-08-16: qty 1

## 2021-08-16 MED ORDER — INSULIN ASPART 100 UNIT/ML IJ SOLN
4.0000 [IU] | Freq: Three times a day (TID) | INTRAMUSCULAR | Status: DC
  Administered 2021-08-16 (×2): 4 [IU] via SUBCUTANEOUS

## 2021-08-16 MED ORDER — INSULIN GLARGINE-YFGN 100 UNIT/ML ~~LOC~~ SOLN
40.0000 [IU] | Freq: Every day | SUBCUTANEOUS | Status: DC
  Administered 2021-08-16: 40 [IU] via SUBCUTANEOUS
  Filled 2021-08-16: qty 0.4

## 2021-08-16 MED ORDER — MELATONIN 3 MG PO TABS
3.0000 mg | ORAL_TABLET | Freq: Every day | ORAL | Status: DC
  Administered 2021-08-16 – 2021-08-17 (×2): 3 mg via ORAL
  Filled 2021-08-16 (×2): qty 1

## 2021-08-16 NOTE — TOC Progression Note (Signed)
Transition of Care [facility]) - Progression Note    Patient Details  Name: [name] [name] MRN: [id number] Date of Birth: May 01, 1932  Transition of Care [facility]) CM/SW [place], [place] Phone Number: 08/16/2021, 1:33 PM  Clinical Narrative:    CSW spoke with pts family who would like to accept bed at [facility]. CSW started insurance auth for pt. TOC to follow for insurance auth.   Expected Discharge Plan: [place] Barriers to Discharge: Continued Medical Work up  Expected Discharge Plan and Services Expected Discharge Plan: [place] In-house Referral: Clinical Social Work Discharge Planning Services: CM Consult Post Acute Care Choice: [place] arrangements for the past 2 months: Single Family Home                                       Social Determinants of Health (SDOH) Interventions    Readmission Risk Interventions No flowsheet data found.

## 2021-08-16 NOTE — Evaluation (Signed)
Occupational Therapy Evaluation Patient Details Name: [name] [name] MRN: [id number] DOB: Oct 16, 1932 Today's Date: 08/16/2021   History of Present Illness [name] [name] is a 87 year old male with past medical history significant for chronic systolic congestive heart failure, Alzheimer's dementia, paroxysmal atrial fibrillation, BPH, CAD, aortic stenosis s/p TAVR, type 2 diabetes mellitus, GERD, hyperlipidemia, CKD stage IIIb who presented [facility] ED via EMS from home with weakness, inability to ambulate.  Patient's niece at bedside and gives most of history.  Review of EMR notable for advancing dementia, now reported end-stage by PCP.  Niece reports patient normally ambulates with a walker at baseline.  He has been progressively getting more weak and his home health aide was unable to get him up and get into the bath.  Patient currently lives alone at home with assistance from a home health aide occasionally.  Given his progressive disease and his inability to care for himself at home, patient was admitted for further evaluation, IV fluid hydration for dehydration and likely need of placement.   Clinical Impression   Patient in bed upon therapy arrival with visitors present. Agreeable to participate in OT evaluation. Recliner unavailable to transfer to from bed and patient completed evaluation seated on EOB. Patient presents with functional strength in BUE and good gross grasp bilaterally. Unable to sustained strength and endurance overall when completing functional tasks such as sit to stand or bed mobility. Patient did demonstrate improvement with ability to sit to stand from EOB based on yesterday's PT evaluation. Provided education to [place] (caregiver) on helping patient sit on edge of bed while she is visiting and reviewed how to turn on and off bed alarm. Pt will benefit from skilled OT services to focus on overall activity tolerance and increase his safety when completing ADL tasks  with caregivers at home. Recommend d/c to SNF for short term rehab prior to returning home.       Recommendations for follow up therapy are one component of a multi-disciplinary discharge planning process, led by the attending physician.  Recommendations may be updated based on patient status, additional functional criteria and insurance authorization.   Follow Up Recommendations  SNF    Equipment Recommendations  None recommended by OT       Precautions / Restrictions Precautions Precautions: Fall Restrictions Weight Bearing Restrictions: No      Mobility Bed Mobility Overal bed mobility: Needs Assistance Bed Mobility: Supine to Sit;Sit to Supine     Supine to sit: Min assist;HOB elevated Sit to supine: Min assist   General bed mobility comments: slow and labored    Transfers Overall transfer level: Needs assistance Equipment used: Rolling walker (2 wheeled) Transfers: Sit to/from Stand Sit to Stand: Min assist;From elevated surface         General transfer comment: slow and labored. provided verbal and physical cues to push up from bed to stand. Pt used walker to pull and push up.    Balance Overall balance assessment: Mild deficits observed, not formally tested         ADL either performed or assessed with clinical judgement   ADL Overall ADL's : Needs assistance/impaired     Grooming: Wash/dry hands;Wash/dry face;Brushing hair;Set up;Sitting   Upper Body Bathing: Minimal assistance;Sitting   Lower Body Bathing: Total assistance;Bed level   Upper Body Dressing : Minimal assistance;Sitting   Lower Body Dressing: Total assistance;Bed level   Toilet Transfer: Moderate assistance;BSC;RW;Squat-pivot   Toileting- Clothing Manipulation and Hygiene: Total assistance;Sit to/from stand;Sitting/lateral  lean               Vision Baseline Vision/History: 0 No visual deficits Ability to See in Adequate Light: 0 Adequate Patient Visual Report: No change  from baseline Vision Assessment?: No apparent visual deficits            Pertinent Vitals/Pain Pain Assessment: Faces Faces Pain Scale: Hurts a little bit Pain Location: back Pain Intervention(s): Limited activity within patient's tolerance;Monitored during session;Repositioned     Hand Dominance Right   Extremity/Trunk Assessment Upper Extremity Assessment Upper Extremity Assessment: Overall WFL for tasks assessed   Lower Extremity Assessment Lower Extremity Assessment: Defer to PT evaluation       Communication Communication Communication: HOH   Cognition Arousal/Alertness: Awake/alert Behavior During Therapy: WFL for tasks assessed/performed Overall Cognitive Status: History of cognitive impairments - at baseline           General Comments  Unable to handle challenges when seated on edge of bed. Slowly would lean posteriorly slightly. Min assist needed to correct seated posture            Home Living Family/patient expects to be discharged to:: Skilled nursing facility Living Arrangements: Alone       Additional Comments: Receives 24hour caregiver assistance      Prior Functioning/Environment Level of Independence: Needs assistance  Gait / Transfers Assistance Needed: Able to use RW to ambulate in home, 24 hour caregiver assist ADL's / Homemaking Assistance Needed: Receives assistance for all ADL tasks.            OT Problem List: Decreased activity tolerance;Impaired balance (sitting and/or standing);Decreased knowledge of use of DME or AE;Decreased safety awareness;Decreased strength      OT Treatment/Interventions: Self-care/ADL training;Therapeutic exercise;Therapeutic activities;Neuromuscular education;Energy conservation;DME and/or AE instruction;Patient/family education;Manual therapy;Balance training;Modalities    OT Goals(Current goals can be found in the care plan section) Acute Rehab OT Goals Patient Stated Goal: to go home OT Goal  Formulation: With patient Time For Goal Achievement: 08/30/21 Potential to Achieve Goals: Good  OT Frequency: Min 2X/week    AM-PAC OT "6 Clicks" Daily Activity     Outcome Measure Help from another person eating meals?: A Little Help from another person taking care of personal grooming?: A Little Help from another person toileting, which includes using toliet, bedpan, or urinal?: Total Help from another person bathing (including washing, rinsing, drying)?: A Lot Help from another person to put on and taking off regular upper body clothing?: A Little Help from another person to put on and taking off regular lower body clothing?: A Lot 6 Click Score: 14   End of Session Equipment Utilized During Treatment: Gait belt;Rolling walker  Activity Tolerance: Patient tolerated treatment well Patient left: in bed;with call bell/phone within reach;with bed alarm set;with family/visitor present  OT Visit Diagnosis: Muscle weakness (generalized) (M62.81);Repeated falls (R29.6)                Time: 6269-4854 OT Time Calculation (min): 45 min Charges:  OT General Charges $OT Visit: 1 Visit OT Evaluation $OT Eval Moderate Complexity: 1 Mod OT Treatments $Self Care/Home Management : 23-37 mins  Ailene Ravel, OTR/L,CBIS  [phone]   Dorleen Kissel, Clarene Duke 08/16/2021, 9:53 AM

## 2021-08-16 NOTE — Plan of Care (Signed)
Pt anxious during overnight xanax given and pt continued to be confused and restless. Removed IV and multiple times removed condom catheter. Vitals stable.  Problem: Coping: Goal: Level of anxiety will decrease Outcome: Not Progressing   Problem: Education: Goal: Knowledge of General Education information will improve Description: Including pain rating scale, medication(s)/side effects and non-pharmacologic comfort measures Outcome: Progressing   Problem: Health Behavior/Discharge Planning: Goal: Ability to manage health-related needs will improve Outcome: Progressing   Problem: Clinical Measurements: Goal: Ability to maintain clinical measurements within normal limits will improve Outcome: Progressing Goal: Will remain free from infection Outcome: Progressing Goal: Diagnostic test results will improve Outcome: Progressing Goal: Respiratory complications will improve Outcome: Progressing Goal: Cardiovascular complication will be avoided Outcome: Progressing   Problem: Activity: Goal: Risk for activity intolerance will decrease Outcome: Progressing   Problem: Nutrition: Goal: Adequate nutrition will be maintained Outcome: Progressing   Problem: Elimination: Goal: Will not experience complications related to bowel motility Outcome: Progressing Goal: Will not experience complications related to urinary retention Outcome: Progressing   Problem: Pain Managment: Goal: General experience of comfort will improve Outcome: Progressing   Problem: Safety: Goal: Ability to remain free from injury will improve Outcome: Progressing   Problem: Skin Integrity: Goal: Risk for impaired skin integrity will decrease Outcome: Progressing

## 2021-08-16 NOTE — Progress Notes (Signed)
PROGRESS NOTE    [name] [name]  [id number] DOB: 1931-11-21 DOA: 08/14/2021 PCP: Kathyrn Drown, MD    Brief Narrative:  [name] [name] is a 87 year old male with past medical history significant for chronic systolic congestive heart failure, Alzheimer's dementia, paroxysmal atrial fibrillation, BPH, CAD, aortic stenosis s/p TAVR, type 2 diabetes mellitus, GERD, hyperlipidemia, CKD stage IIIb who presented to [facility] ED via EMS from home with weakness, inability to ambulate.  Patient's niece at bedside and gives most of history.  Review of EMR notable for advancing dementia, now reported end-stage by PCP.  Niece reports patient normally ambulates with a walker at baseline.  He has been progressively getting more weak and his home health aide was unable to get him up and get into the bath.  Patient currently lives alone at home with assistance from a home health aide occasionally.  Given his progressive disease and his inability to care for himself at home, patient was admitted for further evaluation, IV fluid hydration for dehydration and likely need of placement.  In the ED, temperature 97.5 F, HR 75, RR 20, BP 136/68, SPO2 100% on room air.  Sodium 131, potassium 4.9, chloride 99, CO2 22, glucose 286, BUN 29, creatinine 1.25.  Lactic acid 2.5.  WBC 7.8, hemoglobin 15.5, platelets 167.  Digoxin 0.2.  COVID-19 PCR negative.  Influenza A/B PCR negative.  Urinalysis unrevealing.  Chest x-ray with no acute cardiopulmonary disease process.  EKG with atrial fibrillation, rate 72, LBBB, QTC 428, no concerning dynamic changes; unchanged in comparison to prior tracing.  Patient was started on IV fluid hydration.  Duration consulted for further evaluation and management of dehydration, lactic acidosis, and generalized weakness/debility.   Assessment & Plan:   Principal Problem:   Generalized weakness Active Problems:   Hypertension   Atrial fibrillation (HCC)   Hyperlipidemia   Aortic  stenosis   DM (diabetes mellitus), type 2 with renal complications (HCC)   Current use of long term anticoagulation   Diabetic peripheral neuropathy (HCC)   S/P TAVR (transcatheter aortic valve replacement)   Mitral regurgitation   Chronic systolic heart failure (HCC)   CKD stage 3 due to type 2 diabetes mellitus (HCC)   Dementia (HCC)   Dehydration   Generalized weakness, debility, deconditioning: Dehydration: Lactic acidosis Patient presenting to the ED with acute weakness, in the setting of progressive generalized debility, deconditioning, and weakness.  Patient with underlying end-stage Alzheimer's dementia which is likely significant contributing factor.  Patient with notable elevation of lactic acid of 2.5 likely secondary to dehydration from poor oral intake.  Patient is afebrile without leukocytosis, LFTs within normal limits; unlikely infectious etiology.  Patient currently living alone with intermittent use of aids.  Given his advanced dementia and generalized weakness, unsafe for discharge home will need placement. --PT recommends SNF placement --OT evaluation: Pending --lactic acid 2.5>2.1 --Discontinue IV fluid hydration --repeat lactic acid in the am. --TOC for SNF placement  CKD stage IIIb Creatinine 1.25 on admission.  Creatinine baseline 1.1-1.2.  Patient with reported poor oral intake in the preceding days. --Cr 1.25>1.10>0.96 --Avoid nephrotoxins, renally dose all medications --Discontinue IV fluids today --Encourage increase oral intake --BMP in am  Chronic systolic congestive heart failure, compensated TTE 05/04/2020 with LVEF 35-40%, LV with moderate decreased function with global hypokinesis, increased PASP, LA severely dilated, RA moderately dilated, moderate MR, noted prosthetic TAVR, IVC within normal limits.  Follows with cardiology outpatient, Dr. Haroldine Laws. --Bisoprolol 2.5 mg p.o. daily --Digoxin 0.125  mg p.o. daily --Spironolactone 12.5 mg p.o. nightly  (start PM 10/3) --Imdur 50 mg p.o. daily --Not on ARNi/ARB as BP cannot tolerate --Strict I's and O's and daily weights --Outpatient follow-up with cardiology  Hx severe AS s/p TAVR Follows with cardiology, [name] outpatient.  TTE 2021 with prosthetic TAVR valve present in the aortic position. --Outpatient follow-up with cardiology  CAD --Crestor 5 mg p.o. daily --Not on aspirin, continues on warfarin  Chronic atrial fibrillation --Continue rate control with bisoprolol 2.5 mg p.o. daily --Continue warfarin, pharmacy consulted for dosing/monitoring  Type 2 diabetes mellitus, with hyperglycemia Home regimen includes Lantus 34 units subcutaneously daily, metformin 250mg  PO BID. hemoglobin A1c 8.6 on 04/22/2021, not optimally controlled. --Hold oral hypoglycemics while inpatient --Semglee 40u [place]o qHS --Novolog 4u TIDAC --Moderate SSI for coverage --CBGs qAC/HS  End-stage Alzheimer's dementia --Olanzapine 2.5 mg p.o. twice daily --Xanax 0.25 mg p.o. 3 times daily as needed anxiety   DVT prophylaxis: heparin injection 5,000 Units Start: 08/15/21 0600 SCDs Start: 08/15/21 0143   Code Status: DNR Family Communication: Updated patient's nephew and home health caregiver who is present at bedside this morning  Disposition Plan:  Level of care: Med-Surg Status is: Inpatient  Remains inpatient appropriate because:Unsafe d/c plan  Dispo: The patient is from: Home              Anticipated d/c is to: SNF              Patient currently is medically stable to d/c.   Difficult to place patient No   Consultants:  none  Procedures:  none  Antimicrobials:  none   Subjective: Patient seen examined at bedside, resting comfortably.  Eating breakfast.  Patient's home health caregiver and nephew present at bedside.  Remains pleasantly confused.  Denies headache, no chest pain, no shortness of breath, no abdominal pain.  No acute events overnight per nursing staff.  Awaiting OT  evaluation.  Pending SNF placement as is unsafe to return home alone.  Objective: Vitals:   08/15/21 2001 08/16/21 0215 08/16/21 0456 08/16/21 0500  BP: 105/64 129/65 (!) 154/69   Pulse: 71 75 74   Resp: 20 20 20    Temp: 98.1 F (36.7 C) 97.9 F (36.6 C) 98.5 F (36.9 C)   TempSrc:  Oral    SpO2: 99% 100% 100%   Weight:    95.2 kg  Height:        Intake/Output Summary (Last 24 hours) at 08/16/2021 0949 Last data filed at 08/16/2021 0900 Gross per 24 hour  Intake 720 ml  Output 600 ml  Net 120 ml   Filed Weights   08/14/21 1736 08/16/21 0500  Weight: 95.3 kg 95.2 kg    Examination:  General exam: Appears calm and comfortable, elderly in appearance, pleasantly confused Respiratory system: Clear to auscultation. Respiratory effort normal.  On room air Cardiovascular system: S1 & S2 heard, irregularly irregular rhythm, normal rate. No JVD, murmurs, rubs, gallops or clicks. No pedal edema. Gastrointestinal system: Abdomen is nondistended, soft and nontender. No organomegaly or masses felt. Normal bowel sounds heard. Central nervous system: Alert, oriented to place (hospital), but not time (2012), person Johnella Moloney is person who ran against Trump"), or situation. No focal neurological deficits. Extremities: Symmetric 5 x 5 power. Skin: No rashes, lesions or ulcers Psychiatry: Judgement and insight appear poor. Mood & affect appropriate.     Data Reviewed: I have personally reviewed following labs and imaging studies  CBC: Recent Labs  Lab 08/14/21  1751 08/15/21 0541  WBC 7.8 8.1  NEUTROABS  --  6.1  HGB 15.5 14.8  HCT 45.4 43.9  MCV 93.4 93.4  PLT 167 563   Basic Metabolic Panel: Recent Labs  Lab 08/14/21 1751 08/15/21 0541 08/16/21 0649  NA 131* 136 136  K 4.9 4.2 4.1  CL 99 103 105  CO2 22 26 24   GLUCOSE 286* 251* 206*  BUN 29* 24* 20  CREATININE 1.25* 1.10 0.96  CALCIUM 9.0 9.1 8.9  MG  --  1.8 1.7   GFR: Estimated Creatinine Clearance: 61.3  mL/min (by C-G formula based on SCr of 0.96 mg/dL). Liver Function Tests: Recent Labs  Lab 08/15/21 0541  AST 24  ALT 25  ALKPHOS 80  BILITOT 0.8  PROT 7.1  ALBUMIN 3.8   No results for input(s): LIPASE, AMYLASE in the last 168 hours. No results for input(s): AMMONIA in the last 168 hours. Coagulation Profile: No results for input(s): INR, PROTIME in the last 168 hours. Cardiac Enzymes: No results for input(s): CKTOTAL, CKMB, CKMBINDEX, TROPONINI in the last 168 hours. BNP (last 3 results) No results for input(s): PROBNP in the last 8760 hours. HbA1C: Recent Labs    08/15/21 0542  HGBA1C 8.6*   CBG: Recent Labs  Lab 08/15/21 0744 08/15/21 1148 08/15/21 1705 08/15/21 2002 08/16/21 0734  GLUCAP 208* 300* 217* 258* 190*   Lipid Profile: No results for input(s): CHOL, HDL, LDLCALC, TRIG, CHOLHDL, LDLDIRECT in the last 72 hours. Thyroid Function Tests: No results for input(s): TSH, T4TOTAL, FREET4, T3FREE, THYROIDAB in the last 72 hours. Anemia Panel: No results for input(s): VITAMINB12, FOLATE, FERRITIN, TIBC, IRON, RETICCTPCT in the last 72 hours. Sepsis Labs: Recent Labs  Lab 08/14/21 1839 08/15/21 0541 08/16/21 0650  LATICACIDVEN 2.5* 1.7 2.2*    Recent Results (from the past 240 hour(s))  Resp Panel by RT-PCR (Flu A&B, Covid) Nasopharyngeal Swab     Status: None   Collection Time: 08/14/21  6:23 PM   Specimen: Nasopharyngeal Swab; Nasopharyngeal(NP) swabs in vial transport medium  Result Value Ref Range Status   SARS Coronavirus 2 by RT PCR NEGATIVE NEGATIVE Final    Comment: (NOTE) SARS-CoV-2 target nucleic acids are NOT DETECTED.  The SARS-CoV-2 RNA is generally detectable in upper respiratory specimens during the acute phase of infection. The lowest concentration of SARS-CoV-2 viral copies this assay can detect is 138 copies/mL. A negative result does not preclude SARS-Cov-2 infection and should not be used as the sole basis for treatment or other  patient management decisions. A negative result may occur with  improper specimen collection/handling, submission of specimen other than nasopharyngeal swab, presence of viral mutation(s) within the areas targeted by this assay, and inadequate number of viral copies(<138 copies/mL). A negative result must be combined with clinical observations, patient history, and epidemiological information. The expected result is Negative.  Fact Sheet for Patients:  EntrepreneurPulse.com.au  Fact Sheet for Healthcare Providers:  IncredibleEmployment.be  This test is no t yet approved or cleared by the [place] FDA and  has been authorized for detection and/or diagnosis of SARS-CoV-2 by FDA under an Emergency Use Authorization (EUA). This EUA will remain  in effect (meaning this test can be used) for the duration of the COVID-19 declaration under Section 564(b)(1) of the Act, 21 U.S.C.section 360bbb-3(b)(1), unless the authorization is terminated  or revoked sooner.       Influenza A by PCR NEGATIVE NEGATIVE Final   Influenza B by PCR NEGATIVE NEGATIVE Final  Comment: (NOTE) The Xpert Xpress SARS-CoV-2/FLU/RSV plus assay is intended as an aid in the diagnosis of influenza from Nasopharyngeal swab specimens and should not be used as a sole basis for treatment. Nasal washings and aspirates are unacceptable for Xpert Xpress SARS-CoV-2/FLU/RSV testing.  Fact Sheet for Patients: EntrepreneurPulse.com.au  Fact Sheet for Healthcare Providers: IncredibleEmployment.be  This test is not yet approved or cleared by the [place] FDA and has been authorized for detection and/or diagnosis of SARS-CoV-2 by FDA under an Emergency Use Authorization (EUA). This EUA will remain in effect (meaning this test can be used) for the duration of the COVID-19 declaration under Section 564(b)(1) of the Act, 21 U.S.C. section  360bbb-3(b)(1), unless the authorization is terminated or revoked.  Performed at [facility], [address] [place], [place] [address]   Urine Culture     Status: None   Collection Time: 08/14/21  9:00 PM   Specimen: Urine, Clean Catch  Result Value Ref Range Status   Specimen Description   Final    URINE, CLEAN CATCH Performed at [facility], [address] [place], [place] [address]    Special Requests   Final    NONE Performed at [facility], [address] [place], [place] [address]    Culture   Final    NO GROWTH Performed at [facility], [place] [address], [place], [place] [address]    Report Status 08/16/2021 FINAL  Final         Radiology Studies: DG Chest Portable 1 View  Result Date: 08/14/2021 CLINICAL DATA:  Weakness.  Hoarse voice. EXAM: PORTABLE CHEST 1 VIEW COMPARISON:  Chest radiograph 07/13/2021. FINDINGS: Monitoring leads overlie the patient. Stable cardiac and mediastinal contours. No consolidative pulmonary opacities. No pleural effusion or pneumothorax. IMPRESSION: No acute cardiopulmonary process. Electronically Signed   By: Lovey Newcomer M.D.   On: 08/14/2021 19:13        Scheduled Meds:  bisoprolol  5 mg Oral Daily   digoxin  0.0625 mg Oral Daily   heparin  5,000 Units Subcutaneous Q8H   influenza vaccine adjuvanted  0.5 mL Intramuscular Tomorrow-1000   insulin aspart  0-15 Units Subcutaneous TID WC   insulin aspart  0-5 Units Subcutaneous QHS   insulin aspart  4 Units Subcutaneous TID WC   insulin glargine-yfgn  40 Units Subcutaneous QHS   isosorbide mononitrate  15 mg Oral Daily   nortriptyline  10 mg Oral QHS   OLANZapine  2.5 mg Oral BID   rosuvastatin  5 mg Oral QHS   spironolactone  12.5 mg Oral QHS   Continuous Infusions:     LOS: 1 day    Time spent: 39 minutes spent on chart review, discussion with nursing staff, consultants, updating family and interview/physical exam; more than 50% of that time was spent in counseling  and/or coordination of care.    Mirai Greenwood J [place] DO [facility] Available via Epic secure chat 7am-7pm After these hours, please refer to coverage provider listed on [email or web address] 08/16/2021, 9:49 AM

## 2021-08-16 NOTE — Plan of Care (Signed)
  Problem: Education: Goal: Knowledge of General Education information will improve Description: Including pain rating scale, medication(s)/side effects and non-pharmacologic comfort measures Outcome: Not Progressing   Problem: Health Behavior/Discharge Planning: Goal: Ability to manage health-related needs will improve Outcome: Not Progressing   Problem: Clinical Measurements: Goal: Ability to maintain clinical measurements within normal limits will improve Outcome: Progressing Goal: Will remain free from infection Outcome: Progressing Goal: Diagnostic test results will improve Outcome: Progressing Goal: Respiratory complications will improve Outcome: Progressing Goal: Cardiovascular complication will be avoided Outcome: Progressing   Problem: Activity: Goal: Risk for activity intolerance will decrease Outcome: Not Progressing   Problem: Nutrition: Goal: Adequate nutrition will be maintained Outcome: Progressing   Problem: Coping: Goal: Level of anxiety will decrease Outcome: Progressing   Problem: Elimination: Goal: Will not experience complications related to bowel motility Outcome: Progressing Goal: Will not experience complications related to urinary retention Outcome: Progressing   Problem: Pain Managment: Goal: General experience of comfort will improve Outcome: Progressing   Problem: Safety: Goal: Ability to remain free from injury will improve Outcome: Progressing   Problem: Skin Integrity: Goal: Risk for impaired skin integrity will decrease Outcome: Not Progressing

## 2021-08-16 NOTE — Plan of Care (Signed)
  Problem: Acute Rehab OT Goals (only OT should resolve) Goal: Pt. Will Perform Upper Body Bathing Flowsheets (Taken 08/16/2021 0957) Pt Will Perform Upper Body Bathing:  with supervision  sitting Goal: Pt. Will Perform Lower Body Bathing Flowsheets (Taken 08/16/2021 0957) Pt Will Perform Lower Body Bathing:  with mod assist  sitting/lateral leans  sit to/from stand Goal: Pt. Will Perform Upper Body Dressing Flowsheets (Taken 08/16/2021 0957) Pt Will Perform Upper Body Dressing:  with supervision  sitting Goal: Pt. Will Perform Lower Body Dressing Flowsheets (Taken 08/16/2021 0957) Pt Will Perform Lower Body Dressing:  with mod assist  sit to/from stand  sitting/lateral leans Goal: Pt. Will Transfer To Toilet Flowsheets (Taken 08/16/2021 0957) Pt Will Transfer to Toilet:  with min assist  ambulating  regular height toilet  bedside commode  grab bars Note: With use of RW. Goal: Pt. Will Perform Toileting-Clothing Manipulation Flowsheets (Taken 08/16/2021 0957) Pt Will Perform Toileting - Clothing Manipulation and hygiene:  with min assist  sitting/lateral leans  sit to/from stand Goal: OT Additional ADL Goal #1 Flowsheets (Taken 08/16/2021 0957) Additional ADL Goal #1: Patient will increase overall activity tolerance while completing a 10-15 minute morning ADL task without requiring more than 1 rest break seated.

## 2021-08-17 LAB — GLUCOSE, CAPILLARY
Glucose-Capillary: 204 mg/dL — ABNORMAL HIGH (ref 70–99)
Glucose-Capillary: 224 mg/dL — ABNORMAL HIGH (ref 70–99)
Glucose-Capillary: 237 mg/dL — ABNORMAL HIGH (ref 70–99)
Glucose-Capillary: 222 mg/dL — ABNORMAL HIGH (ref 70–99)
Glucose-Capillary: 166 mg/dL — ABNORMAL HIGH (ref 70–99)

## 2021-08-17 MED ORDER — TRAZODONE HCL 50 MG PO TABS: 25.0000 mg | ORAL_TABLET | Freq: Every evening | ORAL | Status: DC | PRN

## 2021-08-17 MED ORDER — INSULIN GLARGINE-YFGN 100 UNIT/ML ~~LOC~~ SOLN
45.0000 [IU] | Freq: Every day | SUBCUTANEOUS | Status: DC
  Administered 2021-08-17: 45 [IU] via SUBCUTANEOUS
  Filled 2021-08-17 (×2): qty 0.45

## 2021-08-17 MED ORDER — INSULIN ASPART 100 UNIT/ML IJ SOLN: 8.0000 [IU] | Freq: Three times a day (TID) | INTRAMUSCULAR | Status: DC

## 2021-08-17 NOTE — Progress Notes (Signed)
PROGRESS NOTE    WALLACE GAPPA  [id number] DOB: 1932/06/12 DOA: 08/14/2021 PCP: Kathyrn Drown, MD    Brief Narrative:  Sheppard Luckenbach Stefanik is a 87 year old male with past medical history significant for chronic systolic congestive heart failure, Alzheimer's dementia, paroxysmal atrial fibrillation, BPH, CAD, aortic stenosis s/p TAVR, type 2 diabetes mellitus, GERD, hyperlipidemia, CKD stage IIIb who presented to [facility] ED via EMS from home with weakness, inability to ambulate.  Patient's niece at bedside and gives most of history.  Review of EMR notable for advancing dementia, now reported end-stage by PCP.  Niece reports patient normally ambulates with a walker at baseline.  He has been progressively getting more weak and his home health aide was unable to get him up and get into the bath.  Patient currently lives alone at home with assistance from a home health aide occasionally.  Given his progressive disease and his inability to care for himself at home, patient was admitted for further evaluation, IV fluid hydration for dehydration and likely need of placement.  In the ED, temperature 97.5 F, HR 75, RR 20, BP 136/68, SPO2 100% on room air.  Sodium 131, potassium 4.9, chloride 99, CO2 22, glucose 286, BUN 29, creatinine 1.25.  Lactic acid 2.5.  WBC 7.8, hemoglobin 15.5, platelets 167.  Digoxin 0.2.  COVID-19 PCR negative.  Influenza A/B PCR negative.  Urinalysis unrevealing.  Chest x-ray with no acute cardiopulmonary disease process.  EKG with atrial fibrillation, rate 72, LBBB, QTC 428, no concerning dynamic changes; unchanged in comparison to prior tracing.  Patient was started on IV fluid hydration.  Duration consulted for further evaluation and management of dehydration, lactic acidosis, and generalized weakness/debility.   Assessment & Plan:   Principal Problem:   Generalized weakness Active Problems:   Hypertension   Atrial fibrillation (HCC)   Hyperlipidemia   Aortic  stenosis   DM (diabetes mellitus), type 2 with renal complications (HCC)   Current use of long term anticoagulation   Diabetic peripheral neuropathy (HCC)   S/P TAVR (transcatheter aortic valve replacement)   Mitral regurgitation   Chronic systolic heart failure (HCC)   CKD stage 3 due to type 2 diabetes mellitus (HCC)   Dementia (HCC)   Dehydration   Generalized weakness, debility, deconditioning: Dehydration: Lactic acidosis Patient presenting to the ED with acute weakness, in the setting of progressive generalized debility, deconditioning, and weakness.  Patient with underlying end-stage Alzheimer's dementia which is likely significant contributing factor.  Patient with notable elevation of lactic acid of 2.5 likely secondary to dehydration from poor oral intake.  Patient is afebrile without leukocytosis, LFTs within normal limits; unlikely infectious etiology.  Patient currently living alone with intermittent use of aids.  Given his advanced dementia and generalized weakness, unsafe for discharge home will need placement. --PT/OT recommends SNF placement; pending insurance authorization for [facility]  CKD stage IIIb Creatinine 1.25 on admission.  Creatinine baseline 1.1-1.2.  Patient with reported poor oral intake in the preceding days.  Received IV fluid hydration which has now been discontinued with normalization of creatinine. --Cr 1.25>1.10>0.96 --Avoid nephrotoxins, renally dose all medications --Encourage increase oral intake  Chronic systolic congestive heart failure, compensated TTE 05/04/2020 with LVEF 35-40%, LV with moderate decreased function with global hypokinesis, increased PASP, LA severely dilated, RA moderately dilated, moderate MR, noted prosthetic TAVR, IVC within normal limits.  Follows with cardiology outpatient, Dr. Haroldine Laws. --Bisoprolol 2.5 mg p.o. daily --Digoxin 0.125 mg p.o. daily --Spironolactone 12.5 mg p.o.  nightly (start PM 10/3) --Imdur 50 mg  p.o. daily --Not on ARNi/ARB as BP cannot tolerate --Strict I's and O's and daily weights --Outpatient follow-up with cardiology  Hx severe AS s/p TAVR Follows with cardiology, [name] outpatient.  TTE 2021 with prosthetic TAVR valve present in the aortic position. --Outpatient follow-up with cardiology  CAD --Crestor 5 mg p.o. daily --Not on aspirin, continues on warfarin  Chronic atrial fibrillation --Continue rate control with bisoprolol 2.5 mg p.o. daily --Continue warfarin, pharmacy consulted for dosing/monitoring  Type 2 diabetes mellitus, with hyperglycemia Home regimen includes Lantus 34 units subcutaneously daily, metformin 250mg  PO BID. hemoglobin A1c 8.6 on 04/22/2021, not optimally controlled. --Hold oral hypoglycemics while inpatient --Semglee 45u [place] qHS --Novolog 8u TIDAC --Moderate SSI for coverage --CBGs qAC/HS  End-stage Alzheimer's dementia --Olanzapine 2.5 mg p.o. twice daily --Xanax 0.25 mg p.o. 3 times daily as needed anxiety --Melatonin 3 mg p.o. nightly --Trazodone 25 mg p.o. nightly as needed   DVT prophylaxis: heparin injection 5,000 Units Start: 08/15/21 0600 SCDs Start: 08/15/21 0143   Code Status: DNR Family Communication: Updated patient's nephew and home health caregiver who is present at bedside this morning  Disposition Plan:  Level of care: Med-Surg Status is: Inpatient  Remains inpatient appropriate because:Unsafe d/c plan  Dispo: The patient is from: Home              Anticipated d/c is to: SNF [facility], pending insurance authorization              Patient currently is medically stable to d/c.   Difficult to place patient No   Consultants:  none  Procedures:  none  Antimicrobials:  none   Subjective: Patient seen examined at bedside, resting comfortably.  Sleeping but easily arousable.  Patient with anxiety overnight. Patient's home health caregiver present at bedside.  Remains pleasantly confused.  Denies headache,  no chest pain, no shortness of breath, no abdominal pain.  No acute events overnight per nursing staff.  Pending insurance authorization for discharge to [facility] as patient unable to safely discharge home as he lives alone.  Objective: Vitals:   08/16/21 1312 08/16/21 2022 08/17/21 0351 08/17/21 0500  BP: 134/85 121/62 129/68   Pulse: 64 92 (!) 106   Resp: 16 19 19    Temp: 98.5 F (36.9 C) 98 F (36.7 C) 98.4 F (36.9 C)   TempSrc:      SpO2: 100% 98% 100%   Weight:    91.9 kg  Height:        Intake/Output Summary (Last 24 hours) at 08/17/2021 0833 Last data filed at 08/17/2021 0400 Gross per 24 hour  Intake 240 ml  Output 1700 ml  Net -1460 ml   Filed Weights   08/14/21 1736 08/16/21 0500 08/17/21 0500  Weight: 95.3 kg 95.2 kg 91.9 kg    Examination:  General exam: Appears calm and comfortable, elderly in appearance, pleasantly confused Respiratory system: Clear to auscultation. Respiratory effort normal.  On room air Cardiovascular system: S1 & S2 heard, irregularly irregular rhythm, normal rate. No JVD, murmurs, rubs, gallops or clicks. No pedal edema. Gastrointestinal system: Abdomen is nondistended, soft and nontender. No organomegaly or masses felt. Normal bowel sounds heard. Central nervous system: Alert, oriented to place (hospital), but not time (2012), person Johnella Moloney is person who ran against Trump"), or situation. No focal neurological deficits. Extremities: Symmetric 5 x 5 power. Skin: No rashes, lesions or ulcers Psychiatry: Judgement and insight appear poor. Mood &  affect appropriate.     Data Reviewed: I have personally reviewed following labs and imaging studies  CBC: Recent Labs  Lab 08/14/21 1751 08/15/21 0541  WBC 7.8 8.1  NEUTROABS  --  6.1  HGB 15.5 14.8  HCT 45.4 43.9  MCV 93.4 93.4  PLT 167 650   Basic Metabolic Panel: Recent Labs  Lab 08/14/21 1751 08/15/21 0541 08/16/21 0649  NA 131* 136 136  K 4.9 4.2 4.1  CL 99  103 105  CO2 22 26 24   GLUCOSE 286* 251* 206*  BUN 29* 24* 20  CREATININE 1.25* 1.10 0.96  CALCIUM 9.0 9.1 8.9  MG  --  1.8 1.7   GFR: Estimated Creatinine Clearance: 61.3 mL/min (by C-G formula based on SCr of 0.96 mg/dL). Liver Function Tests: Recent Labs  Lab 08/15/21 0541  AST 24  ALT 25  ALKPHOS 80  BILITOT 0.8  PROT 7.1  ALBUMIN 3.8   No results for input(s): LIPASE, AMYLASE in the last 168 hours. No results for input(s): AMMONIA in the last 168 hours. Coagulation Profile: No results for input(s): INR, PROTIME in the last 168 hours. Cardiac Enzymes: No results for input(s): CKTOTAL, CKMB, CKMBINDEX, TROPONINI in the last 168 hours. BNP (last 3 results) No results for input(s): PROBNP in the last 8760 hours. HbA1C: Recent Labs    08/15/21 0542  HGBA1C 8.6*   CBG: Recent Labs  Lab 08/16/21 1102 08/16/21 1604 08/16/21 2030 08/17/21 0653 08/17/21 0706  GLUCAP 261* 139* 93 204* 224*   Lipid Profile: No results for input(s): CHOL, HDL, LDLCALC, TRIG, CHOLHDL, LDLDIRECT in the last 72 hours. Thyroid Function Tests: No results for input(s): TSH, T4TOTAL, FREET4, T3FREE, THYROIDAB in the last 72 hours. Anemia Panel: No results for input(s): VITAMINB12, FOLATE, FERRITIN, TIBC, IRON, RETICCTPCT in the last 72 hours. Sepsis Labs: Recent Labs  Lab 08/14/21 1839 08/15/21 0541 08/16/21 0650  LATICACIDVEN 2.5* 1.7 2.2*    Recent Results (from the past 240 hour(s))  Resp Panel by RT-PCR (Flu A&B, Covid) Nasopharyngeal Swab     Status: None   Collection Time: 08/14/21  6:23 PM   Specimen: Nasopharyngeal Swab; Nasopharyngeal(NP) swabs in vial transport medium  Result Value Ref Range Status   SARS Coronavirus 2 by RT PCR NEGATIVE NEGATIVE Final    Comment: (NOTE) SARS-CoV-2 target nucleic acids are NOT DETECTED.  The SARS-CoV-2 RNA is generally detectable in upper respiratory specimens during the acute phase of infection. The lowest concentration of SARS-CoV-2  viral copies this assay can detect is 138 copies/mL. A negative result does not preclude SARS-Cov-2 infection and should not be used as the sole basis for treatment or other patient management decisions. A negative result may occur with  improper specimen collection/handling, submission of specimen other than nasopharyngeal swab, presence of viral mutation(s) within the areas targeted by this assay, and inadequate number of viral copies(<138 copies/mL). A negative result must be combined with clinical observations, patient history, and epidemiological information. The expected result is Negative.  Fact Sheet for Patients:  EntrepreneurPulse.com.au  Fact Sheet for Healthcare Providers:  IncredibleEmployment.be  This test is no t yet approved or cleared by the [place] FDA and  has been authorized for detection and/or diagnosis of SARS-CoV-2 by FDA under an Emergency Use Authorization (EUA). This EUA will remain  in effect (meaning this test can be used) for the duration of the COVID-19 declaration under Section 564(b)(1) of the Act, 21 U.S.C.section 360bbb-3(b)(1), unless the authorization is terminated  or revoked  sooner.       Influenza A by PCR NEGATIVE NEGATIVE Final   Influenza B by PCR NEGATIVE NEGATIVE Final    Comment: (NOTE) The Xpert Xpress SARS-CoV-2/FLU/RSV plus assay is intended as an aid in the diagnosis of influenza from Nasopharyngeal swab specimens and should not be used as a sole basis for treatment. Nasal washings and aspirates are unacceptable for Xpert Xpress SARS-CoV-2/FLU/RSV testing.  Fact Sheet for Patients: EntrepreneurPulse.com.au  Fact Sheet for Healthcare Providers: IncredibleEmployment.be  This test is not yet approved or cleared by the [place] FDA and has been authorized for detection and/or diagnosis of SARS-CoV-2 by FDA under an Emergency Use Authorization  (EUA). This EUA will remain in effect (meaning this test can be used) for the duration of the COVID-19 declaration under Section 564(b)(1) of the Act, 21 U.S.C. section 360bbb-3(b)(1), unless the authorization is terminated or revoked.  Performed at [facility], [address] [place], [place] [address]   Urine Culture     Status: None   Collection Time: 08/14/21  9:00 PM   Specimen: Urine, Clean Catch  Result Value Ref Range Status   Specimen Description   Final    URINE, CLEAN CATCH Performed at [facility], [address], [place], [place] [address]    Special Requests   Final    NONE Performed at [facility], [address] [place], [place] [address]    Culture   Final    NO GROWTH Performed at [facility], [place] [address], [place], [place] [address]    Report Status 08/16/2021 FINAL  Final         Radiology Studies: No results found.      Scheduled Meds:  bisoprolol  5 mg Oral Daily   digoxin  0.0625 mg Oral Daily   heparin  5,000 Units Subcutaneous Q8H   insulin aspart  0-15 Units Subcutaneous TID WC   insulin aspart  0-5 Units Subcutaneous QHS   insulin aspart  8 Units Subcutaneous TID WC   insulin glargine-yfgn  45 Units Subcutaneous QHS   isosorbide mononitrate  15 mg Oral Daily   melatonin  3 mg Oral QHS   nortriptyline  10 mg Oral QHS   OLANZapine  2.5 mg Oral BID   rosuvastatin  5 mg Oral QHS   spironolactone  12.5 mg Oral QHS   Continuous Infusions:     LOS: 2 days    Time spent: 38 minutes spent on chart review, discussion with nursing staff, consultants, updating family and interview/physical exam; more than 50% of that time was spent in counseling and/or coordination of care.    Syliva Mee J [place] DO [facility] Available via Epic secure chat 7am-7pm After these hours, please refer to coverage provider listed on [email or web address] 08/17/2021, 8:33 AM

## 2021-08-17 NOTE — Plan of Care (Signed)
  Problem: Education: Goal: Knowledge of General Education information will improve Description: Including pain rating scale, medication(s)/side effects and non-pharmacologic comfort measures Outcome: Not Progressing   Problem: Health Behavior/Discharge Planning: Goal: Ability to manage health-related needs will improve Outcome: Not Progressing   Problem: Clinical Measurements: Goal: Ability to maintain clinical measurements within normal limits will improve Outcome: Progressing Goal: Will remain free from infection Outcome: Progressing Goal: Diagnostic test results will improve Outcome: Progressing Goal: Respiratory complications will improve Outcome: Progressing Goal: Cardiovascular complication will be avoided Outcome: Progressing   Problem: Activity: Goal: Risk for activity intolerance will decrease Outcome: Not Progressing   Problem: Nutrition: Goal: Adequate nutrition will be maintained Outcome: Progressing   Problem: Coping: Goal: Level of anxiety will decrease Outcome: Progressing   Problem: Elimination: Goal: Will not experience complications related to bowel motility Outcome: Progressing Goal: Will not experience complications related to urinary retention Outcome: Progressing   Problem: Pain Managment: Goal: General experience of comfort will improve Outcome: Progressing   Problem: Safety: Goal: Ability to remain free from injury will improve Outcome: Progressing   Problem: Skin Integrity: Goal: Risk for impaired skin integrity will decrease Outcome: Not Progressing

## 2021-08-17 NOTE — TOC Progression Note (Signed)
Transition of Care [facility]) - Progression Note    Patient Details  Name: [name] [name] MRN: [id number] Date of Birth: [birth year]  Transition of Care [facility]) CM/SW [place], [place] Phone Number: 08/17/2021, 10:44 AM  Clinical Narrative:    CSW followed up with pts insurance who states that Josem Kaufmann is under med review and they will call with update when there is an answer. They are hopeful to have an answer today. CSW spoke to [place] with [facility] who states they can accept pt for SNF when insurance has approved. CSW inquired about if their beds have rails as family was inquiring with CSW. CSW updated that they do not have rails as they are seen as restraints however they do have U bars. Also, if pt is likely to try and get up they will lower his bed to the lowest setting and place mats on all sides. TOC to follow for insurance approval.  Expected Discharge Plan: Skilled Nursing Facility Barriers to Discharge: Ship broker  Expected Discharge Plan and Services Expected Discharge Plan: [place] In-house Referral: Clinical Social Work Discharge Planning Services: CM Consult Post Acute Care Choice: [place] Living arrangements for the past 2 months: Single Family Home                                       Social Determinants of Health (SDOH) Interventions    Readmission Risk Interventions No flowsheet data found.

## 2021-08-17 NOTE — Discharge Summary (Signed)
Physician Discharge Summary  [name] [name] [id number] DOB: 25-Oct-1932 DOA: 08/14/2021  PCP: Kathyrn Drown, MD  Admit date: 08/14/2021 Discharge date: 08/20/2021  Admitted From: Home Disposition: Home with hospice  Recommendations for Outpatient Follow-up:  Follow up with hospice agency  Home health: Hospice services Equipment/devices: Hospital bed, Foley catheter  Discharge Condition: Stable CODE STATUS: DNR Diet recommendation: Comfort feeds as tolerates  History of present illness:  [name] [name] is a 87 year old male with past medical history significant for chronic systolic congestive heart failure, Alzheimer's dementia, paroxysmal atrial fibrillation, BPH, CAD, aortic stenosis s/p TAVR, type 2 diabetes mellitus, GERD, hyperlipidemia, CKD stage IIIb who presented to [facility] ED via EMS from home with weakness, inability to ambulate.  Patient's niece at bedside and gives most of history.  Review of EMR notable for advancing dementia, now reported end-stage by PCP.  Niece reports patient normally ambulates with a walker at baseline.  He has been progressively getting more weak and his home health aide was unable to get him up and get into the bath.  Patient currently lives alone at home with assistance from a home health aide occasionally.  Given his progressive disease and his inability to care for himself at home, patient was admitted for further evaluation, IV fluid hydration for dehydration and likely need of placement.   In the ED, temperature 97.5 F, HR 75, RR 20, BP 136/68, SPO2 100% on room air.  Sodium 131, potassium 4.9, chloride 99, CO2 22, glucose 286, BUN 29, creatinine 1.25.  Lactic acid 2.5.  WBC 7.8, hemoglobin 15.5, platelets 167.  Digoxin 0.2.  COVID-19 PCR negative.  Influenza A/B PCR negative.  Urinalysis unrevealing.  Chest x-ray with no acute cardiopulmonary disease process.  EKG with atrial fibrillation, rate 72, LBBB, QTC 428, no concerning dynamic  changes; unchanged in comparison to prior tracing.  Patient was started on IV fluid hydration.  Duration consulted for further evaluation and management of dehydration, lactic acidosis, and generalized weakness/debility.  Hospital course:  Acute metabolic encephalopathy Aspiration pneumonia Concern for pontine hemorrhage During the hospitalization, patient has become more somnolent, lethargic with coarse respiratory sounds. Chest x-ray this morning notable for interval finding of right-sided consolidation concerning for aspiration pneumonia.  CT head without contrast with concern for hemorrhage within the pons.  Urinalysis unrevealing.  Blood cultures x2 with 1 out of 4 with GPC's, likely contaminant.  Initially started on Unasyn and normal saline bolus.  Given his acute decline, discussed with patient's niece and nephew at bedside and determined best course moving forward is to transition to comfort measures as he has had a progressive decline over the last several months.  Continue Ativan and oral morphine for symptomatic relief of agitation, shortness of breath, pain control.  Scopolamine patch for excessive secretions.  Foley catheter placed for comfort and will continue on discharge.  Discharging back home under hospice care services given that no residential hospice bed available at this time. Overall very poor prognosis with likely less than 2 weeks of life remaining given his lack of oral intake, somnolence lethargy.   Generalized weakness, debility, deconditioning: Dehydration: Lactic acidosis Patient presenting to the ED with acute weakness, in the setting of progressive generalized debility, deconditioning, and weakness.  Patient with underlying end-stage Alzheimer's dementia which is likely significant contributing factor.  Patient with notable elevation of lactic acid of 2.5 likely secondary to dehydration from poor oral intake.  Patient is afebrile without leukocytosis, LFTs within normal  limits.  Patient currently living alone with intermittent use of aids. Given patient's progressive decline during hospitalization, now transitioned to comfort measures.   CKD stage IIIb Creatinine 1.25 on admission.  Creatinine baseline 1.1-1.2.  Patient with reported poor oral intake in the preceding days.  Received IV fluid hydration which has now been discontinued with normalization of creatinine.  Now on comfort measures as above.  Discontinued all lab draws.   Chronic systolic congestive heart failure, compensated TTE 05/04/2020 with LVEF 35-40%, LV with moderate decreased function with global hypokinesis, increased PASP, LA severely dilated, RA moderately dilated, moderate MR, noted prosthetic TAVR, IVC within normal limits.  Follows with cardiology outpatient, Dr. Haroldine Laws.  On bisoprolol, digoxin, spironolactone, Imdur outpatient, all of which has been discontinued as transition to comfort measures.   Hx severe AS s/p TAVR Follows with cardiology, [name] outpatient.  TTE 2021 with prosthetic TAVR valve present in the aortic position.   CAD Discontinue Crestor on comfort measures   Chronic atrial fibrillation Discontinue bisoprolol now on comfort measures.  Not on anticoagulation outpatient; and now with possible pontine hemorrhage as above.   Type 2 diabetes mellitus, with hyperglycemia Home regimen includes Lantus 34 units subcutaneously daily, metformin 250mg  PO BID. hemoglobin A1c 8.6 on 04/22/2021, not optimally controlled.  Insulin and glucose monitoring now discontinued as transition to comfort measures.   End-stage Alzheimer's dementia Progress significantly, on olanzapine, Xanax at home; which have now been discontinued.  Continue Haldol, Ativan, morphine as above for symptomatic relief.    Discharge Diagnoses:  Principal Problem:   Generalized weakness Active Problems:   Hypertension   Atrial fibrillation (HCC)   Hyperlipidemia   Aortic stenosis   DM (diabetes  mellitus), type 2 with renal complications (HCC)   Current use of long term anticoagulation   Diabetic peripheral neuropathy (HCC)   S/P TAVR (transcatheter aortic valve replacement)   Mitral regurgitation   Chronic systolic heart failure ([place])   CKD stage 3 due to type 2 diabetes mellitus ([place])   Dementia ([place])   Dehydration   Hospice care patient    Discharge Instructions  Discharge Instructions     Diet - low sodium heart healthy   Complete by: As directed    Increase activity slowly   Complete by: As directed       Allergies as of 08/20/2021       Reactions   Ambien [zolpidem Tartrate] Other (See Comments)   Sleep walks   Lipitor [atorvastatin Calcium] Other (See Comments)   myalgias   Ranitidine Other (See Comments)   Chest discomfort   Simvastatin Other (See Comments)   Myalgias   Xanax Xr [alprazolam Er] Other (See Comments)   Tightness in chest   Cholestatin Other (See Comments)   UNSPECIFIED REACTION    Clopidogrel Bisulfate Rash        Medication List     STOP taking these medications    B12-ACTIVE PO   bisoprolol 5 MG tablet Commonly known as: ZEBETA   cholecalciferol 25 MCG (1000 UNIT) tablet Commonly known as: VITAMIN D3   digoxin 0.125 MG tablet Commonly known as: LANOXIN   furosemide 20 MG tablet Commonly known as: LASIX   isosorbide mononitrate 30 MG 24 hr tablet Commonly known as: IMDUR   Lantus SoloStar 100 UNIT/ML Solostar Pen Generic drug: insulin glargine   metFORMIN 500 MG tablet Commonly known as: GLUCOPHAGE   nortriptyline 10 MG capsule Commonly known as: PAMELOR   OLANZapine 2.5 MG tablet Commonly known as: ZYPREXA  psyllium 58.6 % powder Commonly known as: METAMUCIL   rosuvastatin 5 MG tablet Commonly known as: CRESTOR   spironolactone 25 MG tablet Commonly known as: ALDACTONE   TRUEplus Pen Needles 31G X 8 MM Misc Generic drug: Insulin Pen Needle       TAKE these medications    acetaminophen 325  MG tablet Commonly known as: TYLENOL Take 650 mg by mouth every 6 (six) hours as needed for mild pain or moderate pain.   ALPRAZolam 0.25 MG tablet Commonly known as: XANAX Take 1 tablet (0.25 mg total) by mouth 3 (three) times daily as needed for anxiety. What changed: See the new instructions.   LORazepam 2 MG/ML concentrated solution Commonly known as: ATIVAN Take 1 mL (2 mg total) by mouth every 6 (six) hours as needed for up to 14 days for anxiety (shortness of breath, aggitation).   morphine 10 MG/5ML solution Take 2.5 mLs (5 mg total) by mouth every 2 (two) hours as needed for up to 7 days for severe pain or moderate pain.   scopolamine 1 MG/3DAYS Commonly known as: TRANSDERM-SCOP Place 1 patch (1.5 mg total) onto the skin every 3 (three) days for 18 days. Start taking on: August 21, 2021        [place], [facility] Follow up.   Contact information: [address] [place] [address] [phone]                Allergies  Allergen Reactions   Ambien [Zolpidem Tartrate] Other (See Comments)    Sleep walks   Lipitor [Atorvastatin Calcium] Other (See Comments)    myalgias   Ranitidine Other (See Comments)    Chest discomfort   Simvastatin Other (See Comments)    Myalgias   Xanax Xr [Alprazolam Er] Other (See Comments)    Tightness in chest   Cholestatin Other (See Comments)    UNSPECIFIED REACTION    Clopidogrel Bisulfate Rash    Consultations: Palliative care   Procedures/Studies: CT HEAD WO CONTRAST (5MM)  Result Date: 08/18/2021 CLINICAL DATA:  Mental status change, persistent or worsening. Metabolic encephalopathy. EXAM: CT HEAD WITHOUT CONTRAST TECHNIQUE: Contiguous axial images were obtained from the base of the skull through the vertex without intravenous contrast. COMPARISON:  08/31/2020 CT.  04/03/2012 MRI. FINDINGS: Brain: Chronic cerebellar nuclear calcification. Old small vessel cerebellar infarctions as  seen previously. Chronic small-vessel ischemic changes of the pons. Question punctate hemorrhagic small vessel infarction in the right para median pons. This is not definite. Chronic volume loss of the anterior temporal lobes right more than left and of the left lateral temporal lobe. These could be due to old infarctions or old head trauma. Cerebral hemispheres otherwise show atrophy and chronic small-vessel ischemic changes of the white matter. No hydrocephalus or extra-axial collection. Vascular: There is atherosclerotic calcification of the major vessels at the base of the brain. Skull: Negative Sinuses/Orbits: Clear/normal Other: None IMPRESSION: Chronic small-vessel ischemic changes throughout the brain as outlined above. Question acute punctate hemorrhagic infarction in the right para median pons. This is not definite. Consider MRI for confirmation. Atrophy and encephalomalacia of both anterior temporal lobes right more than left and of the lateral left temporal lobe. These could be due to old infarctions or old head trauma. Electronically Signed   By: Nelson Chimes M.D.   On: 08/18/2021 13:15   DG CHEST PORT 1 VIEW  Result Date: 08/18/2021 CLINICAL DATA:  Shortness of breath. EXAM: PORTABLE CHEST 1  VIEW COMPARISON:  August 14, 2021. FINDINGS: The heart size and mediastinal contours are within normal limits. Status post transcatheter aortic valve repair. Mild bibasilar subsegmental atelectasis is noted. The visualized skeletal structures are unremarkable. IMPRESSION: Mild bibasilar subsegmental atelectasis. Electronically Signed   By: Marijo Conception M.D.   On: 08/18/2021 13:20   DG Chest Portable 1 View  Result Date: 08/14/2021 CLINICAL DATA:  Weakness.  Hoarse voice. EXAM: PORTABLE CHEST 1 VIEW COMPARISON:  Chest radiograph 07/13/2021. FINDINGS: Monitoring leads overlie the patient. Stable cardiac and mediastinal contours. No consolidative pulmonary opacities. No pleural effusion or pneumothorax.  IMPRESSION: No acute cardiopulmonary process. Electronically Signed   By: Lovey Newcomer M.D.   On: 08/14/2021 19:13     Subjective: Patient seen examined bedside, resting comfortably.  Caretaker and niece present.  Patient continues to be somnolent, confused.  Poor/little oral intake.  Discharging home today under home hospice services.  No other questions or concerns at this time.    No acute events overnight per nursing staff.  Discharge Exam: Vitals:   08/19/21 2300 08/20/21 0527  BP:  (!) 159/69  Pulse: 84 75  Resp:    Temp:  97.8 F (36.6 C)  SpO2: 100% 100%   Vitals:   08/19/21 1352 08/19/21 2051 08/19/21 2300 08/20/21 0527  BP: (!) 132/45 (!) 153/61  (!) 159/69  Pulse: 81 83 84 75  Resp:  20    Temp: 98.2 F (36.8 C) 98 F (36.7 C)  97.8 F (36.6 C)  TempSrc: Oral Oral  Oral  SpO2: 91% 98% 100% 100%  Weight:      Height:        General: Alertness waxing/waning, confused, chronically ill/cachectic in appearance Cardiovascular: RRR, S1/S2 +, no rubs, no gallops Respiratory: Coarse breath sounds bilaterally, decreased breath sounds bilateral bases, no wheezing Abdominal: Soft, NT, ND, bowel sounds +, Foley catheter placed for comfort Extremities: no edema, no cyanosis    The results of significant diagnostics from this hospitalization (including imaging, microbiology, ancillary and laboratory) are listed below for reference.     Microbiology: Recent Results (from the past 240 hour(s))  Resp Panel by RT-PCR (Flu A&B, Covid) Nasopharyngeal Swab     Status: None   Collection Time: 08/14/21  6:23 PM   Specimen: Nasopharyngeal Swab; Nasopharyngeal(NP) swabs in vial transport medium  Result Value Ref Range Status   SARS Coronavirus 2 by RT PCR NEGATIVE NEGATIVE Final    Comment: (NOTE) SARS-CoV-2 target nucleic acids are NOT DETECTED.  The SARS-CoV-2 RNA is generally detectable in upper respiratory specimens during the acute phase of infection. The  lowest concentration of SARS-CoV-2 viral copies this assay can detect is 138 copies/mL. A negative result does not preclude SARS-Cov-2 infection and should not be used as the sole basis for treatment or other patient management decisions. A negative result may occur with  improper specimen collection/handling, submission of specimen other than nasopharyngeal swab, presence of viral mutation(s) within the areas targeted by this assay, and inadequate number of viral copies(<138 copies/mL). A negative result must be combined with clinical observations, patient history, and epidemiological information. The expected result is Negative.  Fact Sheet for Patients:  EntrepreneurPulse.com.au  Fact Sheet for Healthcare Providers:  IncredibleEmployment.be  This test is no t yet approved or cleared by the [place] FDA and  has been authorized for detection and/or diagnosis of SARS-CoV-2 by FDA under an Emergency Use Authorization (EUA). This EUA will remain  in effect (meaning this test can  be used) for the duration of the COVID-19 declaration under Section 564(b)(1) of the Act, 21 U.S.C.section 360bbb-3(b)(1), unless the authorization is terminated  or revoked sooner.       Influenza A by PCR NEGATIVE NEGATIVE Final   Influenza B by PCR NEGATIVE NEGATIVE Final    Comment: (NOTE) The Xpert Xpress SARS-CoV-2/FLU/RSV plus assay is intended as an aid in the diagnosis of influenza from Nasopharyngeal swab specimens and should not be used as a sole basis for treatment. Nasal washings and aspirates are unacceptable for Xpert Xpress SARS-CoV-2/FLU/RSV testing.  Fact Sheet for Patients: EntrepreneurPulse.com.au  Fact Sheet for Healthcare Providers: IncredibleEmployment.be  This test is not yet approved or cleared by the [place] FDA and has been authorized for detection and/or diagnosis of SARS-CoV-2 by FDA under  an Emergency Use Authorization (EUA). This EUA will remain in effect (meaning this test can be used) for the duration of the COVID-19 declaration under Section 564(b)(1) of the Act, 21 U.S.C. section 360bbb-3(b)(1), unless the authorization is terminated or revoked.  Performed at [facility], [address], [place], [place] [address]   Urine Culture     Status: None   Collection Time: 08/14/21  9:00 PM   Specimen: Urine, Clean Catch  Result Value Ref Range Status   Specimen Description   Final    URINE, CLEAN CATCH Performed at [facility], [address] [place], [place] [address]    Special Requests   Final    NONE Performed at [facility], [address], [place], [place] [address]    Culture   Final    NO GROWTH Performed at [facility], [place] [address], [place], [place] [address]    Report Status 08/16/2021 FINAL  Final  Culture, blood (routine x 2)     Status: None (Preliminary result)   Collection Time: 08/18/21 10:27 AM   Specimen: BLOOD RIGHT HAND  Result Value Ref Range Status   Specimen Description BLOOD RIGHT HAND  Final   Special Requests   Final    BOTTLES DRAWN AEROBIC AND ANAEROBIC Blood Culture adequate volume   Culture   Final    NO GROWTH 2 DAYS Performed at [facility], [address] [place], [place] [address]    Report Status PENDING  Incomplete  Culture, blood (routine x 2)     Status: Abnormal (Preliminary result)   Collection Time: 08/18/21 10:29 AM   Specimen: BLOOD LEFT FOREARM  Result Value Ref Range Status   Specimen Description   Final    BLOOD LEFT FOREARM Performed at [facility], [address] [place], [place] [address]    Special Requests   Final    BOTTLES DRAWN AEROBIC AND ANAEROBIC Blood Culture adequate volume Performed at [facility], [address], [place], [place] [address]    Culture  Setup Time   Final    GRAM POSITIVE COCCI ANAEROBIC BOTTLE Gram Stain Report Called to,Read Back By and Verified With: THOMAS,  [id number]  BY MATTHEWS, B 10.6.22 CRITICAL RESULT CALLED TO, READ BACK BY AND VERIFIED WITH: G,COFFEE PHARMD @0941  08/19/21 EB    Culture (A)  Final    STAPHYLOCOCCUS EPIDERMIDIS THE SIGNIFICANCE OF ISOLATING THIS ORGANISM FROM A SINGLE SET OF BLOOD CULTURES WHEN MULTIPLE SETS ARE DRAWN IS UNCERTAIN. PLEASE NOTIFY THE MICROBIOLOGY DEPARTMENT WITHIN ONE WEEK IF SPECIATION AND SENSITIVITIES ARE REQUIRED. Performed at [facility], [place] [address] [place], [place] [address]    Report Status PENDING  Incomplete  Blood Culture ID Panel (Reflexed)     Status: Abnormal   Collection Time: 08/18/21 10:29 AM  Result Value Ref Range Status   Enterococcus faecalis NOT DETECTED NOT DETECTED Final   Enterococcus Faecium NOT DETECTED NOT DETECTED Final   Listeria monocytogenes NOT DETECTED NOT DETECTED Final   Staphylococcus species DETECTED (A) NOT DETECTED Final    Comment: CRITICAL RESULT CALLED TO, READ BACK BY AND VERIFIED WITH: G,COFFEE PHARMD @0941  08/19/21 EB    Staphylococcus aureus (BCID) NOT DETECTED NOT DETECTED Final   Staphylococcus epidermidis DETECTED (A) NOT DETECTED Final    Comment: CRITICAL RESULT CALLED TO, READ BACK BY AND VERIFIED WITH: G,COFFEE PHARMD @0941  08/19/21 EB    Staphylococcus lugdunensis NOT DETECTED NOT DETECTED Final   Streptococcus species NOT DETECTED NOT DETECTED Final   Streptococcus agalactiae NOT DETECTED NOT DETECTED Final   Streptococcus pneumoniae NOT DETECTED NOT DETECTED Final   Streptococcus pyogenes NOT DETECTED NOT DETECTED Final   A.calcoaceticus-baumannii NOT DETECTED NOT DETECTED Final   Bacteroides fragilis NOT DETECTED NOT DETECTED Final   Enterobacterales NOT DETECTED NOT DETECTED Final   Enterobacter cloacae complex NOT DETECTED NOT DETECTED Final   Escherichia coli NOT DETECTED NOT DETECTED Final   Klebsiella aerogenes NOT DETECTED NOT DETECTED Final   Klebsiella oxytoca NOT DETECTED NOT DETECTED Final   Klebsiella pneumoniae NOT  DETECTED NOT DETECTED Final   Proteus species NOT DETECTED NOT DETECTED Final   Salmonella species NOT DETECTED NOT DETECTED Final   Serratia marcescens NOT DETECTED NOT DETECTED Final   Haemophilus influenzae NOT DETECTED NOT DETECTED Final   Neisseria meningitidis NOT DETECTED NOT DETECTED Final   Pseudomonas aeruginosa NOT DETECTED NOT DETECTED Final   Stenotrophomonas maltophilia NOT DETECTED NOT DETECTED Final   Candida albicans NOT DETECTED NOT DETECTED Final   Candida auris NOT DETECTED NOT DETECTED Final   Candida glabrata NOT DETECTED NOT DETECTED Final   Candida krusei NOT DETECTED NOT DETECTED Final   Candida parapsilosis NOT DETECTED NOT DETECTED Final   Candida tropicalis NOT DETECTED NOT DETECTED Final   Cryptococcus neoformans/gattii NOT DETECTED NOT DETECTED Final   Methicillin resistance mecA/C NOT DETECTED NOT DETECTED Final    Comment: Performed at [facility], [address], [place], [place] [address]  Urine Culture     Status: Abnormal (Preliminary result)   Collection Time: 08/18/21 11:32 AM   Specimen: Urine, Clean Catch  Result Value Ref Range Status   Specimen Description   Final    URINE, CLEAN CATCH Performed at [facility], [address], [place], [place] [address]    Special Requests   Final    NONE Performed at [facility], [address] [place], [place] [address]    Culture (A)  Final    >=100,000 COLONIES/mL MORGANELLA MORGANII REPEATING SUSCEPTIBILITIES Performed at [facility], [place] [address] [place], [place] [address]    Report Status PENDING  Incomplete     Labs: BNP (last 3 results) Recent Labs    01/08/21 1205 07/13/21 1320  BNP 69.7 29.5   Basic Metabolic Panel: Recent Labs  Lab 08/14/21 1751 08/15/21 0541 08/16/21 0649 08/18/21 1030  NA 131* 136 136 135  K 4.9 4.2 4.1 4.5  CL 99 103 105 102  CO2 22 26 24 24   GLUCOSE 286* 251* 206* 226*  BUN 29* 24* 20 30*  CREATININE 1.25* 1.10 0.96 1.15  CALCIUM 9.0  9.1 8.9 8.9  MG  --  1.8 1.7 1.8  Liver Function Tests: Recent Labs  Lab 08/15/21 0541 08/18/21 1030  AST 24 47*  ALT 25 30  ALKPHOS 80 90  BILITOT 0.8 1.6*  PROT 7.1 7.3  ALBUMIN 3.8 3.7   No results for input(s): LIPASE, AMYLASE in the last 168 hours. No results for input(s): AMMONIA in the last 168 hours. CBC: Recent Labs  Lab 08/14/21 1751 08/15/21 0541 08/18/21 1030  WBC 7.8 8.1 10.2  NEUTROABS  --  6.1  --   HGB 15.5 14.8 13.6  HCT 45.4 43.9 41.2  MCV 93.4 93.4 96.5  PLT 167 153 166   Cardiac Enzymes: No results for input(s): CKTOTAL, CKMB, CKMBINDEX, TROPONINI in the last 168 hours. BNP: Invalid input(s): POCBNP CBG: Recent Labs  Lab 08/17/21 1106 08/17/21 1650 08/17/21 2046 08/18/21 0759 08/18/21 1035  GLUCAP 237* 222* 166* 184* 205*   D-Dimer No results for input(s): DDIMER in the last 72 hours. Hgb A1c No results for input(s): HGBA1C in the last 72 hours.  Lipid Profile No results for input(s): CHOL, HDL, LDLCALC, TRIG, CHOLHDL, LDLDIRECT in the last 72 hours. Thyroid function studies No results for input(s): TSH, T4TOTAL, T3FREE, THYROIDAB in the last 72 hours.  Invalid input(s): FREET3 Anemia work up No results for input(s): VITAMINB12, FOLATE, FERRITIN, TIBC, IRON, RETICCTPCT in the last 72 hours. Urinalysis    Component Value Date/Time   COLORURINE YELLOW 08/18/2021 1132   APPEARANCEUR HAZY (A) 08/18/2021 1132   LABSPEC 1.018 08/18/2021 1132   PHURINE 5.0 08/18/2021 1132   GLUCOSEU 50 (A) 08/18/2021 1132   HGBUR MODERATE (A) 08/18/2021 1132   BILIRUBINUR NEGATIVE 08/18/2021 1132   BILIRUBINUR + 06/23/2014 1707   KETONESUR NEGATIVE 08/18/2021 1132   PROTEINUR 30 (A) 08/18/2021 1132   UROBILINOGEN 2.0 (A) 07/27/2017 1056   UROBILINOGEN 0.2 08/20/2015 2030   NITRITE NEGATIVE 08/18/2021 1132   LEUKOCYTESUR MODERATE (A) 08/18/2021 1132   Sepsis Labs Invalid input(s): PROCALCITONIN,  WBC,  LACTICIDVEN Microbiology Recent Results  (from the past 240 hour(s))  Resp Panel by RT-PCR (Flu A&B, Covid) Nasopharyngeal Swab     Status: None   Collection Time: 08/14/21  6:23 PM   Specimen: Nasopharyngeal Swab; Nasopharyngeal(NP) swabs in vial transport medium  Result Value Ref Range Status   SARS Coronavirus 2 by RT PCR NEGATIVE NEGATIVE Final    Comment: (NOTE) SARS-CoV-2 target nucleic acids are NOT DETECTED.  The SARS-CoV-2 RNA is generally detectable in upper respiratory specimens during the acute phase of infection. The lowest concentration of SARS-CoV-2 viral copies this assay can detect is 138 copies/mL. A negative result does not preclude SARS-Cov-2 infection and should not be used as the sole basis for treatment or other patient management decisions. A negative result may occur with  improper specimen collection/handling, submission of specimen other than nasopharyngeal swab, presence of viral mutation(s) within the areas targeted by this assay, and inadequate number of viral copies(<138 copies/mL). A negative result must be combined with clinical observations, patient history, and epidemiological information. The expected result is Negative.  Fact Sheet for Patients:  EntrepreneurPulse.com.au  Fact Sheet for Healthcare Providers:  IncredibleEmployment.be  This test is no t yet approved or cleared by the [place] FDA and  has been authorized for detection and/or diagnosis of SARS-CoV-2 by FDA under an Emergency Use Authorization (EUA). This EUA will remain  in effect (meaning this test can be used) for the duration of the COVID-19 declaration under Section 564(b)(1) of the Act, 21 U.S.C.section 360bbb-3(b)(1), unless the authorization is terminated  or revoked sooner.       Influenza A by PCR NEGATIVE NEGATIVE Final   Influenza B by PCR NEGATIVE NEGATIVE Final    Comment: (NOTE) The Xpert Xpress SARS-CoV-2/FLU/RSV plus assay is intended as an aid in the  diagnosis of influenza from Nasopharyngeal swab specimens and should not be used as a sole basis for treatment. Nasal washings and aspirates are unacceptable for Xpert Xpress SARS-CoV-2/FLU/RSV testing.  Fact Sheet for Patients: EntrepreneurPulse.com.au  Fact Sheet for Healthcare Providers: IncredibleEmployment.be  This test is not yet approved or cleared by the [place] FDA and has been authorized for detection and/or diagnosis of SARS-CoV-2 by FDA under an Emergency Use Authorization (EUA). This EUA will remain in effect (meaning this test can be used) for the duration of the COVID-19 declaration under Section 564(b)(1) of the Act, 21 U.S.C. section 360bbb-3(b)(1), unless the authorization is terminated or revoked.  Performed at [facility], [address], [place], [place] [address]   Urine Culture     Status: None   Collection Time: 08/14/21  9:00 PM   Specimen: Urine, Clean Catch  Result Value Ref Range Status   Specimen Description   Final    URINE, CLEAN CATCH Performed at [facility], [address], [place], [place] [address]    Special Requests   Final    NONE Performed at [facility], [address], [place], [place] [address]    Culture   Final    NO GROWTH Performed at [facility], [place] [address] [place], [place] [address]    Report Status 08/16/2021 FINAL  Final  Culture, blood (routine x 2)     Status: None (Preliminary result)   Collection Time: 08/18/21 10:27 AM   Specimen: BLOOD RIGHT HAND  Result Value Ref Range Status   Specimen Description BLOOD RIGHT HAND  Final   Special Requests   Final    BOTTLES DRAWN AEROBIC AND ANAEROBIC Blood Culture adequate volume   Culture   Final    NO GROWTH 2 DAYS Performed at [facility], [address] [place], [place] [address]    Report Status PENDING  Incomplete  Culture, blood (routine x 2)     Status: Abnormal (Preliminary result)   Collection Time: 08/18/21  10:29 AM   Specimen: BLOOD LEFT FOREARM  Result Value Ref Range Status   Specimen Description   Final    BLOOD LEFT FOREARM Performed at [facility], [address] [place], [place] [address]    Special Requests   Final    BOTTLES DRAWN AEROBIC AND ANAEROBIC Blood Culture adequate volume Performed at [facility], [address] [place], [place] [address]    Culture  Setup Time   Final    GRAM POSITIVE COCCI ANAEROBIC BOTTLE Gram Stain Report Called to,Read Back By and Verified With: THOMAS, [id number]  BY MATTHEWS, B 10.6.22 CRITICAL RESULT CALLED TO, READ BACK BY AND VERIFIED WITH: G,COFFEE PHARMD @0941  08/19/21 EB    Culture (A)  Final    STAPHYLOCOCCUS EPIDERMIDIS THE SIGNIFICANCE OF ISOLATING THIS ORGANISM FROM A SINGLE SET OF BLOOD CULTURES WHEN MULTIPLE SETS ARE DRAWN IS UNCERTAIN. PLEASE NOTIFY THE MICROBIOLOGY DEPARTMENT WITHIN ONE WEEK IF SPECIATION AND SENSITIVITIES ARE REQUIRED. Performed at [facility], [place] [address] [place], [place] [address]    Report Status PENDING  Incomplete  Blood Culture ID Panel (Reflexed)     Status: Abnormal   Collection Time: 08/18/21 10:29 AM  Result Value Ref Range Status  Enterococcus faecalis NOT DETECTED NOT DETECTED Final   Enterococcus Faecium NOT DETECTED NOT DETECTED Final   Listeria monocytogenes NOT DETECTED NOT DETECTED Final   Staphylococcus species DETECTED (A) NOT DETECTED Final    Comment: CRITICAL RESULT CALLED TO, READ BACK BY AND VERIFIED WITH: G,COFFEE PHARMD @0941  08/19/21 EB    Staphylococcus aureus (BCID) NOT DETECTED NOT DETECTED Final   Staphylococcus epidermidis DETECTED (A) NOT DETECTED Final    Comment: CRITICAL RESULT CALLED TO, READ BACK BY AND VERIFIED WITH: G,COFFEE PHARMD @0941  08/19/21 EB    Staphylococcus lugdunensis NOT DETECTED NOT DETECTED Final   Streptococcus species NOT DETECTED NOT DETECTED Final   Streptococcus agalactiae NOT DETECTED NOT DETECTED Final   Streptococcus pneumoniae NOT  DETECTED NOT DETECTED Final   Streptococcus pyogenes NOT DETECTED NOT DETECTED Final   A.calcoaceticus-baumannii NOT DETECTED NOT DETECTED Final   Bacteroides fragilis NOT DETECTED NOT DETECTED Final   Enterobacterales NOT DETECTED NOT DETECTED Final   Enterobacter cloacae complex NOT DETECTED NOT DETECTED Final   Escherichia coli NOT DETECTED NOT DETECTED Final   Klebsiella aerogenes NOT DETECTED NOT DETECTED Final   Klebsiella oxytoca NOT DETECTED NOT DETECTED Final   Klebsiella pneumoniae NOT DETECTED NOT DETECTED Final   Proteus species NOT DETECTED NOT DETECTED Final   Salmonella species NOT DETECTED NOT DETECTED Final   Serratia marcescens NOT DETECTED NOT DETECTED Final   Haemophilus influenzae NOT DETECTED NOT DETECTED Final   Neisseria meningitidis NOT DETECTED NOT DETECTED Final   Pseudomonas aeruginosa NOT DETECTED NOT DETECTED Final   Stenotrophomonas maltophilia NOT DETECTED NOT DETECTED Final   Candida albicans NOT DETECTED NOT DETECTED Final   Candida auris NOT DETECTED NOT DETECTED Final   Candida glabrata NOT DETECTED NOT DETECTED Final   Candida krusei NOT DETECTED NOT DETECTED Final   Candida parapsilosis NOT DETECTED NOT DETECTED Final   Candida tropicalis NOT DETECTED NOT DETECTED Final   Cryptococcus neoformans/gattii NOT DETECTED NOT DETECTED Final   Methicillin resistance mecA/C NOT DETECTED NOT DETECTED Final    Comment: Performed at [facility], [address], [place], [place] [address]  Urine Culture     Status: Abnormal (Preliminary result)   Collection Time: 08/18/21 11:32 AM   Specimen: Urine, Clean Catch  Result Value Ref Range Status   Specimen Description   Final    URINE, CLEAN CATCH Performed at [facility], [address], [place], [place] [address]    Special Requests   Final    NONE Performed at [facility], [address], [place], [place] [address]    Culture (A)  Final    >=100,000 COLONIES/mL MORGANELLA MORGANII REPEATING  SUSCEPTIBILITIES Performed at [facility], [place] [address], [place],  [address]    Report Status PENDING  Incomplete     Time coordinating discharge: Over 30 minutes  SIGNED:   Gearline Spilman J [place] DO  [facility] 08/20/2021, 9:43 AM

## 2021-08-18 ENCOUNTER — Inpatient Hospital Stay (HOSPITAL_COMMUNITY): Payer: PPO

## 2021-08-18 ENCOUNTER — Encounter: Payer: Self-pay | Admitting: Family Medicine

## 2021-08-18 ENCOUNTER — Telehealth: Payer: Self-pay | Admitting: Family Medicine

## 2021-08-18 DIAGNOSIS — Z515 Encounter for palliative care: Secondary | ICD-10-CM

## 2021-08-18 LAB — COMPREHENSIVE METABOLIC PANEL
ALT: 30 U/L (ref 0–44)
AST: 47 U/L — ABNORMAL HIGH (ref 15–41)
Albumin: 3.7 g/dL (ref 3.5–5.0)
Alkaline Phosphatase: 90 U/L (ref 38–126)
Anion gap: 9 (ref 5–15)
BUN: 30 mg/dL — ABNORMAL HIGH (ref 8–23)
CO2: 24 mmol/L (ref 22–32)
Calcium: 8.9 mg/dL (ref 8.9–10.3)
Chloride: 102 mmol/L (ref 98–111)
Creatinine, Ser: 1.15 mg/dL (ref 0.61–1.24)
GFR, Estimated: >60 mL/min (ref >60)
Glucose, Bld: 226 mg/dL — ABNORMAL HIGH (ref 70–99)
Potassium: 4.5 mmol/L (ref 3.5–5.1)
Sodium: 135 mmol/L (ref 135–145)
Total Bilirubin: 1.6 mg/dL — ABNORMAL HIGH (ref 0.3–1.2)
Total Protein: 7.3 g/dL (ref 6.5–8.1)

## 2021-08-18 LAB — URINALYSIS, ROUTINE W REFLEX MICROSCOPIC
Bacteria, UA: NONE SEEN
Bilirubin Urine: NEGATIVE
Glucose, UA: 50 mg/dL — AB
Ketones, ur: NEGATIVE mg/dL
Nitrite: NEGATIVE
Protein, ur: 30 mg/dL — AB
Specific Gravity, Urine: 1.018 (ref 1.005–1.030)
WBC, UA: >50 WBC/hpf — ABNORMAL HIGH (ref 0–5)
pH: 5 (ref 5.0–8.0)

## 2021-08-18 LAB — CBC
HCT: 41.2 % (ref 39.0–52.0)
Hemoglobin: 13.6 g/dL (ref 13.0–17.0)
MCH: 31.9 pg (ref 26.0–34.0)
MCHC: 33 g/dL (ref 30.0–36.0)
MCV: 96.5 fL (ref 80.0–100.0)
Platelets: 166 10*3/uL (ref 150–400)
RBC: 4.27 MIL/uL (ref 4.22–5.81)
RDW: 13.5 % (ref 11.5–15.5)
WBC: 10.2 10*3/uL (ref 4.0–10.5)
nRBC: 0 % (ref 0.0–0.2)

## 2021-08-18 LAB — GLUCOSE, CAPILLARY
Glucose-Capillary: 184 mg/dL — ABNORMAL HIGH (ref 70–99)
Glucose-Capillary: 205 mg/dL — ABNORMAL HIGH (ref 70–99)

## 2021-08-18 LAB — MAGNESIUM: Magnesium: 1.8 mg/dL (ref 1.7–2.4)

## 2021-08-18 LAB — PROCALCITONIN: Procalcitonin: 0.12 ng/mL

## 2021-08-18 MED ORDER — SODIUM CHLORIDE 0.9 % IV SOLN
3.0000 g | Freq: Four times a day (QID) | INTRAVENOUS | Status: DC
  Administered 2021-08-18: 3 g via INTRAVENOUS
  Filled 2021-08-18 (×5): qty 8

## 2021-08-18 MED ORDER — MORPHINE SULFATE (PF) 2 MG/ML IV SOLN
1.0000 mg | INTRAVENOUS | Status: DC | PRN
  Administered 2021-08-19 – 2021-08-20 (×2): 1 mg via INTRAVENOUS
  Filled 2021-08-18 (×2): qty 1

## 2021-08-18 MED ORDER — TRAZODONE HCL 50 MG PO TABS: 25.0000 mg | ORAL_TABLET | Freq: Every evening | ORAL | 0 refills | Status: DC | PRN

## 2021-08-18 MED ORDER — GLYCOPYRROLATE 1 MG PO TABS: 1.0000 mg | ORAL_TABLET | ORAL | Status: DC | PRN

## 2021-08-18 MED ORDER — HALOPERIDOL LACTATE 2 MG/ML PO CONC
0.5000 mg | ORAL | Status: DC | PRN
  Filled 2021-08-18: qty 0.3

## 2021-08-18 MED ORDER — SCOPOLAMINE 1 MG/3DAYS TD PT72: 1.0000 | MEDICATED_PATCH | TRANSDERMAL | Status: DC

## 2021-08-18 MED ORDER — GLYCOPYRROLATE 0.2 MG/ML IJ SOLN: 0.2000 mg | INTRAMUSCULAR | Status: DC | PRN

## 2021-08-18 MED ORDER — LORAZEPAM 2 MG/ML PO CONC: 1.0000 mg | ORAL | Status: DC | PRN

## 2021-08-18 MED ORDER — GUAIFENESIN ER 600 MG PO TB12: 600.0000 mg | ORAL_TABLET | Freq: Two times a day (BID) | ORAL | Status: DC

## 2021-08-18 MED ORDER — HALOPERIDOL 0.5 MG PO TABS: 0.5000 mg | ORAL_TABLET | ORAL | Status: DC | PRN

## 2021-08-18 MED ORDER — ALPRAZOLAM 0.25 MG PO TABS: 0.2500 mg | ORAL_TABLET | Freq: Three times a day (TID) | ORAL | 0 refills | Status: DC | PRN

## 2021-08-18 MED ORDER — ALPRAZOLAM 0.25 MG PO TABS: 0.2500 mg | ORAL_TABLET | Freq: Three times a day (TID) | ORAL | 0 refills | Status: AC | PRN

## 2021-08-18 MED ORDER — LORAZEPAM 2 MG/ML IJ SOLN
1.0000 mg | INTRAMUSCULAR | Status: DC | PRN
  Administered 2021-08-20: 1 mg via INTRAVENOUS
  Filled 2021-08-18: qty 1

## 2021-08-18 MED ORDER — HALOPERIDOL LACTATE 5 MG/ML IJ SOLN
0.5000 mg | INTRAMUSCULAR | Status: DC | PRN
  Administered 2021-08-18: 0.5 mg via INTRAVENOUS
  Filled 2021-08-18: qty 1

## 2021-08-18 MED ORDER — LORAZEPAM 1 MG PO TABS
1.0000 mg | ORAL_TABLET | ORAL | Status: DC | PRN
  Filled 2021-08-18: qty 1

## 2021-08-18 MED ORDER — SODIUM CHLORIDE 0.9 % IV BOLUS
1000.0000 mL | Freq: Once | INTRAVENOUS | Status: AC
  Administered 2021-08-18: 1000 mL via INTRAVENOUS

## 2021-08-18 NOTE — Progress Notes (Signed)
Pharmacy Antibiotic Note  [name] [name] is a 87 y.o. male admitted on 08/14/2021 with aspiration pneumonia.  Pharmacy has been consulted for unasyn dosing.  Plan: Unasyn 3gm IV q6h F/u cxs and clinical progress Monitor V/S, labs  Height: 6\' 1"  (185.4 cm) Weight: 94.2 kg (207 lb 10.8 oz) IBW/kg (Calculated) : 79.9  Temp (24hrs), Avg:98.6 F (37 C), Min:98 F (36.7 C), Max:99.6 F (37.6 C)  Recent Labs  Lab 08/14/21 1751 08/14/21 1839 08/15/21 0541 08/16/21 0649 08/16/21 0650 08/18/21 1030  WBC 7.8  --  8.1  --   --  10.2  CREATININE 1.25*  --  1.10 0.96  --  1.15  LATICACIDVEN  --  2.5* 1.7  --  2.2*  --     Estimated Creatinine Clearance: 51.1 mL/min (by C-G formula based on SCr of 1.15 mg/dL).    Allergies  Allergen Reactions   Ambien [Zolpidem Tartrate] Other (See Comments)    Sleep walks   Lipitor [Atorvastatin Calcium] Other (See Comments)    myalgias   Ranitidine Other (See Comments)    Chest discomfort   Simvastatin Other (See Comments)    Myalgias   Xanax Xr [Alprazolam Er] Other (See Comments)    Tightness in chest   Cholestatin Other (See Comments)    UNSPECIFIED REACTION    Clopidogrel Bisulfate Rash    Antimicrobials this admission: unasyn 10/5 >> pending  Microbiology results: 10/5 BCx: pending 10/5 UCx: pending  10/1 UC: no growth  Thank you for allowing pharmacy to be a part of this patient's care.  Isac Sarna, BS [place], [place] Clinical Pharmacist Pager [phone] 08/18/2021 11:38 AM

## 2021-08-18 NOTE — Telephone Encounter (Signed)
Patient is in hospital and needing help getting into [facility] for rehab not able to stand.He needing a letter stating that he is needing rehab to help him walk and stand because his insurance is denying  his stay. Please advise

## 2021-08-18 NOTE — Progress Notes (Addendum)
PROGRESS NOTE    [name] [name]  [id number] DOB: [birth year] DOA: 08/14/2021 PCP: Kathyrn Drown, MD    Brief Narrative:  [name] [name] is a 87 year old male with past medical history significant for chronic systolic congestive heart failure, Alzheimer's dementia, paroxysmal atrial fibrillation, BPH, CAD, aortic stenosis s/p TAVR, type 2 diabetes mellitus, GERD, hyperlipidemia, CKD stage IIIb who presented to [facility] ED via EMS from home with weakness, inability to ambulate.  Patient's niece at bedside and gives most of history.  Review of EMR notable for advancing dementia, now reported end-stage by PCP.  Niece reports patient normally ambulates with a walker at baseline.  He has been progressively getting more weak and his home health aide was unable to get him up and get into the bath.  Patient currently lives alone at home with assistance from a home health aide occasionally.  Given his progressive disease and his inability to care for himself at home, patient was admitted for further evaluation, IV fluid hydration for dehydration and likely need of placement.  In the ED, temperature 97.5 F, HR 75, RR 20, BP 136/68, SPO2 100% on room air.  Sodium 131, potassium 4.9, chloride 99, CO2 22, glucose 286, BUN 29, creatinine 1.25.  Lactic acid 2.5.  WBC 7.8, hemoglobin 15.5, platelets 167.  Digoxin 0.2.  COVID-19 PCR negative.  Influenza A/B PCR negative.  Urinalysis unrevealing.  Chest x-ray with no acute cardiopulmonary disease process.  EKG with atrial fibrillation, rate 72, LBBB, QTC 428, no concerning dynamic changes; unchanged in comparison to prior tracing.  Patient was started on IV fluid hydration.  Duration consulted for further evaluation and management of dehydration, lactic acidosis, and generalized weakness/debility.   Assessment & Plan:   Principal Problem:   Generalized weakness Active Problems:   Hypertension   Atrial fibrillation (HCC)   Hyperlipidemia   Aortic  stenosis   DM (diabetes mellitus), type 2 with renal complications (HCC)   Current use of long term anticoagulation   Diabetic peripheral neuropathy (HCC)   S/P TAVR (transcatheter aortic valve replacement)   Mitral regurgitation   Chronic systolic heart failure (HCC)   CKD stage 3 due to type 2 diabetes mellitus ([place])   Dementia ([place])   Dehydration   Addendum: [id number] Updated patient's niece and nephew [name] and Richardson Landry via telephone.  Patient with continued progressive decline over the past 2 days, now somnolent and lethargic.  Chest x-ray with findings of aspiration pneumonia on the right and CT head with concerning hemorrhage within the pons.  Discussed with family about continued aggressive measures with IV antibiotics, labs, MRI and given his advanced age and worsening dementia they wish to transition care to a more comfort approach.  We will discontinue all lab draws, imaging studies, unnecessary medications.  Family does wish for transition if needed to residential hospice in [place]. --Ativan/Haldol as needed for agitation, shortness of breath --Morphine IV for pain control, shortness of breath --Scopolamine patch/Robinul for excessive secretions --Overall very poor prognosis with days to weeks of life remaining  Acute metabolic encephalopathy Aspiration pneumonia Patient has become more somnolent and lethargic the past 24 hours.  Patient with coarse respiratory sounds.  Chest x-ray this morning notable for interval finding of right-sided consolidation concerning for aspiration pneumonia.  Patient is afebrile. --Check CT head without contrast --CBC, CMP, blood cultures, urinalysis, urine culture ordered --Start Unasyn --SLP evaluation --1 L NS bolus --Discontinued sedating medications include trazodone, Valium, Zyprexa --Supportive care, incentive spirometry, flutter valve  Generalized weakness, debility, deconditioning: Dehydration: Lactic acidosis Patient presenting to the  ED with acute weakness, in the setting of progressive generalized debility, deconditioning, and weakness.  Patient with underlying end-stage Alzheimer's dementia which is likely significant contributing factor.  Patient with notable elevation of lactic acid of 2.5 likely secondary to dehydration from poor oral intake.  Patient is afebrile without leukocytosis, LFTs within normal limits; unlikely infectious etiology.  Patient currently living alone with intermittent use of aids.  Given his advanced dementia and generalized weakness, unsafe for discharge home will need placement. --PT/OT recommends SNF placement; patient's insurance declined; requesting peer-to-peer  CKD stage IIIb Creatinine 1.25 on admission.  Creatinine baseline 1.1-1.2.  Patient with reported poor oral intake in the preceding days.  Received IV fluid hydration which has now been discontinued with normalization of creatinine. --Cr 1.25>1.10>0.96 --Avoid nephrotoxins, renally dose all medications --Encourage increase oral intake  Chronic systolic congestive heart failure, compensated TTE 05/04/2020 with LVEF 35-40%, LV with moderate decreased function with global hypokinesis, increased PASP, LA severely dilated, RA moderately dilated, moderate MR, noted prosthetic TAVR, IVC within normal limits.  Follows with cardiology outpatient, Dr. Haroldine Laws. --Bisoprolol 2.5 mg p.o. daily --Digoxin 0.125 mg p.o. daily --Spironolactone 12.5 mg p.o. nightly (start PM 10/3) --Imdur 50 mg p.o. daily --Not on ARNi/ARB as BP cannot tolerate --Strict I's and O's and daily weights --Outpatient follow-up with cardiology  Hx severe AS s/p TAVR Follows with cardiology, [name] outpatient.  TTE 2021 with prosthetic TAVR valve present in the aortic position. --Outpatient follow-up with cardiology  CAD --Crestor 5 mg p.o. daily  Chronic atrial fibrillation --Continue rate control with bisoprolol 2.5 mg p.o. daily  Type 2 diabetes mellitus, with  hyperglycemia Home regimen includes Lantus 34 units subcutaneously daily, metformin 250mg  PO BID. hemoglobin A1c 8.6 on 04/22/2021, not optimally controlled. --Hold oral hypoglycemics while inpatient --Semglee 45u [place] qHS --Novolog 8u TIDAC --Moderate SSI for coverage --CBGs qAC/HS  End-stage Alzheimer's dementia --Olanzapine 2.5 mg p.o. twice daily --Xanax 0.25 mg p.o. 3 times daily as needed anxiety --Melatonin 3 mg p.o. nightly --Trazodone 25 mg p.o. nightly as needed   DVT prophylaxis: heparin injection 5,000 Units Start: 08/15/21 0600 SCDs Start: 08/15/21 0143   Code Status: DNR Family Communication: Updated patient's niece and home health caregiver who is present at bedside this morning  Disposition Plan:  Level of care: Med-Surg Status is: Inpatient  Remains inpatient appropriate because:Unsafe d/c plan  Dispo: The patient is from: Home              Anticipated d/c is to: SNF [facility], but declined by insurance today              Patient currently is medically stable to d/c.   Difficult to place patient No   Consultants:  none  Procedures:  none  Antimicrobials:  Unasyn 10/5   Subjective: Patient seen examined at bedside, somnolent.  Home health caregiver and niece present at bedside.  More lethargic this morning.  Nurse concerned for possible stroke versus infection/aspiration.  Chest x-ray this morning notable for interval finding of right lower lobe consolidation concerning for aspiration pneumonia.  Patient was declined for SNF placement by insurance company today.    Objective: Vitals:   08/17/21 1247 08/17/21 2045 08/18/21 0419 08/18/21 0500  BP: 134/68 (!) 153/61 128/83   Pulse: (!) 58 62 (!) 57   Resp: 20 20 19    Temp: 99.6 F (37.6 C) 98.3 F (36.8 C) 98 F (36.7  C)   TempSrc: Axillary     SpO2: 94% 93% 92%   Weight:    94.2 kg  Height:        Intake/Output Summary (Last 24 hours) at 08/18/2021 0947 Last data filed at 08/18/2021  0500 Gross per 24 hour  Intake 480 ml  Output 300 ml  Net 180 ml   Filed Weights   08/16/21 0500 08/17/21 0500 08/18/21 0500  Weight: 95.2 kg 91.9 kg 94.2 kg    Examination:  General exam: Somnolent, more more lethargic, elderly/chronically ill in appearance, pleasantly confused Respiratory system: Coarse breath sounds bilaterally no wheezing on room air Cardiovascular system: S1 & S2 heard, irregularly irregular rhythm, normal rate. No JVD, murmurs, rubs, gallops or clicks. No pedal edema. Gastrointestinal system: Abdomen is nondistended, soft and nontender. No organomegaly or masses felt. Normal bowel sounds heard. Central nervous system: Alert, but not oriented. No focal neurological deficits. Extremities: Moves all extremities independently, but appears decreased muscle strength Skin: No rashes, lesions or ulcers Psychiatry: Judgement and insight appear poor. Mood & affect appropriate.     Data Reviewed: I have personally reviewed following labs and imaging studies  CBC: Recent Labs  Lab 08/14/21 1751 08/15/21 0541  WBC 7.8 8.1  NEUTROABS  --  6.1  HGB 15.5 14.8  HCT 45.4 43.9  MCV 93.4 93.4  PLT 167 295   Basic Metabolic Panel: Recent Labs  Lab 08/14/21 1751 08/15/21 0541 08/16/21 0649  NA 131* 136 136  K 4.9 4.2 4.1  CL 99 103 105  CO2 22 26 24   GLUCOSE 286* 251* 206*  BUN 29* 24* 20  CREATININE 1.25* 1.10 0.96  CALCIUM 9.0 9.1 8.9  MG  --  1.8 1.7   GFR: Estimated Creatinine Clearance: 61.3 mL/min (by C-G formula based on SCr of 0.96 mg/dL). Liver Function Tests: Recent Labs  Lab 08/15/21 0541  AST 24  ALT 25  ALKPHOS 80  BILITOT 0.8  PROT 7.1  ALBUMIN 3.8   No results for input(s): LIPASE, AMYLASE in the last 168 hours. No results for input(s): AMMONIA in the last 168 hours. Coagulation Profile: No results for input(s): INR, PROTIME in the last 168 hours. Cardiac Enzymes: No results for input(s): CKTOTAL, CKMB, CKMBINDEX, TROPONINI in  the last 168 hours. BNP (last 3 results) No results for input(s): PROBNP in the last 8760 hours. HbA1C: No results for input(s): HGBA1C in the last 72 hours.  CBG: Recent Labs  Lab 08/17/21 0706 08/17/21 1106 08/17/21 1650 08/17/21 2046 08/18/21 0759  GLUCAP 224* 237* 222* 166* 184*   Lipid Profile: No results for input(s): CHOL, HDL, LDLCALC, TRIG, CHOLHDL, LDLDIRECT in the last 72 hours. Thyroid Function Tests: No results for input(s): TSH, T4TOTAL, FREET4, T3FREE, THYROIDAB in the last 72 hours. Anemia Panel: No results for input(s): VITAMINB12, FOLATE, FERRITIN, TIBC, IRON, RETICCTPCT in the last 72 hours. Sepsis Labs: Recent Labs  Lab 08/14/21 1839 08/15/21 0541 08/16/21 0650  LATICACIDVEN 2.5* 1.7 2.2*    Recent Results (from the past 240 hour(s))  Resp Panel by RT-PCR (Flu A&B, Covid) Nasopharyngeal Swab     Status: None   Collection Time: 08/14/21  6:23 PM   Specimen: Nasopharyngeal Swab; Nasopharyngeal(NP) swabs in vial transport medium  Result Value Ref Range Status   SARS Coronavirus 2 by RT PCR NEGATIVE NEGATIVE Final    Comment: (NOTE) SARS-CoV-2 target nucleic acids are NOT DETECTED.  The SARS-CoV-2 RNA is generally detectable in upper respiratory specimens during the acute phase  of infection. The lowest concentration of SARS-CoV-2 viral copies this assay can detect is 138 copies/mL. A negative result does not preclude SARS-Cov-2 infection and should not be used as the sole basis for treatment or other patient management decisions. A negative result may occur with  improper specimen collection/handling, submission of specimen other than nasopharyngeal swab, presence of viral mutation(s) within the areas targeted by this assay, and inadequate number of viral copies(<138 copies/mL). A negative result must be combined with clinical observations, patient history, and epidemiological information. The expected result is Negative.  Fact Sheet for Patients:   EntrepreneurPulse.com.au  Fact Sheet for Healthcare Providers:  IncredibleEmployment.be  This test is no t yet approved or cleared by the [place] FDA and  has been authorized for detection and/or diagnosis of SARS-CoV-2 by FDA under an Emergency Use Authorization (EUA). This EUA will remain  in effect (meaning this test can be used) for the duration of the COVID-19 declaration under Section 564(b)(1) of the Act, 21 U.S.C.section 360bbb-3(b)(1), unless the authorization is terminated  or revoked sooner.       Influenza A by PCR NEGATIVE NEGATIVE Final   Influenza B by PCR NEGATIVE NEGATIVE Final    Comment: (NOTE) The Xpert Xpress SARS-CoV-2/FLU/RSV plus assay is intended as an aid in the diagnosis of influenza from Nasopharyngeal swab specimens and should not be used as a sole basis for treatment. Nasal washings and aspirates are unacceptable for Xpert Xpress SARS-CoV-2/FLU/RSV testing.  Fact Sheet for Patients: EntrepreneurPulse.com.au  Fact Sheet for Healthcare Providers: IncredibleEmployment.be  This test is not yet approved or cleared by the [place] FDA and has been authorized for detection and/or diagnosis of SARS-CoV-2 by FDA under an Emergency Use Authorization (EUA). This EUA will remain in effect (meaning this test can be used) for the duration of the COVID-19 declaration under Section 564(b)(1) of the Act, 21 U.S.C. section 360bbb-3(b)(1), unless the authorization is terminated or revoked.  Performed at [facility], [address], [place], [place] [address]   Urine Culture     Status: None   Collection Time: 08/14/21  9:00 PM   Specimen: Urine, Clean Catch  Result Value Ref Range Status   Specimen Description   Final    URINE, CLEAN CATCH Performed at [facility], [address], [place], [place] [address]    Special Requests   Final    NONE Performed at [facility], [address], [place], [place] [address]    Culture   Final    NO GROWTH Performed at [facility], [place] [address] [place], [place] [address]    Report Status 08/16/2021 FINAL  Final         Radiology Studies: No results found.      Scheduled Meds:  bisoprolol  5 mg Oral Daily   digoxin  0.0625 mg Oral Daily   guaiFENesin  600 mg Oral BID   heparin  5,000 Units Subcutaneous Q8H   insulin aspart  0-15 Units Subcutaneous TID WC   insulin aspart  0-5 Units Subcutaneous QHS   insulin aspart  8 Units Subcutaneous TID WC   insulin glargine-yfgn  45 Units Subcutaneous QHS   isosorbide mononitrate  15 mg Oral Daily   melatonin  3 mg Oral QHS   nortriptyline  10 mg Oral QHS   rosuvastatin  5 mg Oral QHS   spironolactone  12.5 mg Oral QHS   Continuous Infusions:  sodium chloride  LOS: 3 days    Time spent: 38 minutes spent on chart review, discussion with nursing staff, consultants, updating family and interview/physical exam; more than 50% of that time was spent in counseling and/or coordination of care.    Kaylene Dawn J [place] DO [facility] Available via Epic secure chat 7am-7pm After these hours, please refer to coverage provider listed on [email or web address] 08/18/2021, 9:47 AM

## 2021-08-18 NOTE — Progress Notes (Signed)
Upon entering room pt lethargic only responsive to voice and sternal rub. Pt sounded very congested and family states he was having difficulty swallowing. Held AM po med's due to felt it unsafe to administer at this present time. LPN let MD know. CT ordered stat, MRI, UA/UC ordered. Vital signs stable. O2 sat 95% respirations 28. Patient resting with eyes closed comfortably. Will continue to monitor patient.

## 2021-08-18 NOTE — TOC Progression Note (Signed)
Transition of Care [facility]) - Progression Note    Patient Details  Name: [name] [name] MRN: [id number] Date of Birth: 05/24/1932  Transition of Care [facility]) CM/SW [place], [place] Phone Number: 08/18/2021, 1:48 PM  Clinical Narrative:    CSW updated by pts insurance that he has not been approved for SNF. There was an offer for a peer to peer however CSW spoke with MD who states that currently pt is not medically stable and peer to peer is not needed at this time. Due to this insurance Josem Kaufmann has been stopped. TOC supervisor spoke with medical director for health team advantage who states he is familiar with pts case and he does not feel that a new auth, peer to peer, or family appeal will change the auth status. It is felt that pt is in need of custodial care.   CSW spoke in room with pts caregiver who states that she is in the home M-F for 8 hours and also comes out on the weekends. She assists pt with all ADLs. CSW also spoke with pts caregiver and POA about insurance status and working on a long term plan. They would appreciate a consult with palliative for goals of care. If they decide to take pt home they will need DME in the home. TOC to follow for needs.   Expected Discharge Plan: Skilled Nursing Facility Barriers to Discharge: Ship broker  Expected Discharge Plan and Services Expected Discharge Plan: [place] In-house Referral: Clinical Social Work Discharge Planning Services: CM Consult Post Acute Care Choice: [place] Living arrangements for the past 2 months: Single Family Home                                       Social Determinants of Health (SDOH) Interventions    Readmission Risk Interventions No flowsheet data found.

## 2021-08-18 NOTE — Progress Notes (Signed)
Physical Therapy Treatment Patient Details Name: [name] [name] MRN: [id number] DOB: [birth year] Today's Date: 08/18/2021   History of Present Illness [name] [name] is a 87 year old male with past medical history significant for chronic systolic congestive heart failure, Alzheimer's dementia, paroxysmal atrial fibrillation, BPH, CAD, aortic stenosis s/p TAVR, type 2 diabetes mellitus, GERD, hyperlipidemia, CKD stage IIIb who presented to [facility] ED via EMS from home with weakness, inability to ambulate.  Patient's niece at bedside and gives most of history.  Review of EMR notable for advancing dementia, now reported end-stage by PCP.  Niece reports patient normally ambulates with a walker at baseline.  He has been progressively getting more weak and his home health aide was unable to get him up and get into the bath.  Patient currently lives alone at home with assistance from a home health aide occasionally.  Given his progressive disease and his inability to care for himself at home, patient was admitted for further evaluation, IV fluid hydration for dehydration and likely need of placement.    PT Comments    Pt responding to commands appropriately, Weaker in LE than UE.  Pt needs SNF to improve his safety in mobility.  Treatment interrupted by stat CT    Recommendations for follow up therapy are one component of a multi-disciplinary discharge planning process, led by the attending physician.  Recommendations may be updated based on patient status, additional functional criteria and insurance authorization.  Follow Up Recommendations  SNF     Equipment Recommendations  None recommended by PT    Recommendations for Other Services       Precautions / Restrictions Precautions Precautions: Fall                                                 Cognition Arousal/Alertness: Awake/alert Behavior During Therapy: WFL for tasks assessed/performed Overall Cognitive  Status: History of cognitive impairments - at baseline                                        Exercises General Exercises - Upper Extremity Shoulder Flexion: AAROM;Both;5 reps Shoulder ABduction:  (chest press x 5) General Exercises - Lower Extremity Ankle Circles/Pumps: Both;10 reps Short Arc Quad: Both;AAROM;5 reps Heel Slides: Both;5 reps;AAROM Hip ABduction/ADduction: Both;5 reps;AAROM    General Comments        Pertinent Vitals/Pain Pain Assessment: Faces Pain Score: 0-No pain    Home Living Family/patient expects to be discharged to:: Skilled nursing facility Living Arrangements: Alone             Additional Comments: Receives 24hour caregiver assistance    Prior Function Level of Independence: Needs assistance  Gait / Transfers Assistance Needed: Able to use RW to ambulate in home, 24 hour caregiver assist ADL's / Homemaking Assistance Needed: Receives assistance for all ADL tasks.     PT Goals (current goals can now be found in the care plan section)      Frequency    Min 3X/week       AM-PAC PT "6 Clicks" Mobility   Outcome Measure  Help needed turning from your back to your side while in a flat bed without using bedrails?: A Lot Help needed moving from lying on your back to  sitting on the side of a flat bed without using bedrails?: A Lot Help needed moving to and from a bed to a chair (including a wheelchair)?: A Lot Help needed standing up from a chair using your arms (e.g., wheelchair or bedside chair)?: A Lot Help needed to walk in hospital room?: A Lot Help needed climbing 3-5 steps with a railing? : A Lot 6 Click Score: 12    End of Session   Activity Tolerance: Patient limited by fatigue Patient left:  (Pt had to go down for a stat CT)   PT Visit Diagnosis: Unsteadiness on feet (R26.81);Other abnormalities of gait and mobility (R26.89);Muscle weakness (generalized) (M62.81)     Time: 4103-0131 PT Time Calculation  (min) (ACUTE ONLY): 15 min  Charges:  $Therapeutic Exercise: 8-22 mins                      Rayetta Humphrey, PT CLT [phone]  08/18/2021, 10:00 AM

## 2021-08-18 NOTE — Plan of Care (Signed)
Pt alert self. Drowsy during overnight. Pt would not take meds during overnight at hs. Pt drowsy and when awakened by kept closing mouth. This am pt more alert. Orange juice given. Pt noted to have increased upper tracheal congestion. Pt encouraged to cough and followed direction. Incentive spirometer brought in room and pt instructed on use. Poor effort only 250 ml.  Problem: Education: Goal: Knowledge of General Education information will improve Description: Including pain rating scale, medication(s)/side effects and non-pharmacologic comfort measures Outcome: Progressing   Problem: Health Behavior/Discharge Planning: Goal: Ability to manage health-related needs will improve Outcome: Progressing   Problem: Clinical Measurements: Goal: Ability to maintain clinical measurements within normal limits will improve Outcome: Progressing Goal: Will remain free from infection Outcome: Progressing Goal: Diagnostic test results will improve Outcome: Progressing Goal: Respiratory complications will improve Outcome: Progressing Goal: Cardiovascular complication will be avoided Outcome: Progressing   Problem: Activity: Goal: Risk for activity intolerance will decrease Outcome: Progressing   Problem: Nutrition: Goal: Adequate nutrition will be maintained Outcome: Progressing   Problem: Coping: Goal: Level of anxiety will decrease Outcome: Progressing   Problem: Elimination: Goal: Will not experience complications related to bowel motility Outcome: Progressing Goal: Will not experience complications related to urinary retention Outcome: Progressing   Problem: Pain Managment: Goal: General experience of comfort will improve Outcome: Progressing   Problem: Safety: Goal: Ability to remain free from injury will improve Outcome: Progressing   Problem: Skin Integrity: Goal: Risk for impaired skin integrity will decrease Outcome: Progressing

## 2021-08-18 NOTE — Progress Notes (Signed)
SLP Cancellation Note  Patient Details Name: [name] [name] MRN: [id number] DOB: 27-Feb-1932   Cancelled treatment:       Reason Eval/Treat Not Completed: Fatigue/lethargy limiting ability to participate;Patient's level of consciousness. Despite max cues, SLP was unable to rouse Pt to a level appropriate for PO trials. Pt's respirations are audible and congested; Recommend NPO status as his alertness places him at very high risk for aspiration. Note palliative consult; SLP will continue to follow for goals of care and/or appropriateness for BSE. Thank you,  Yovanni Frenette H. Roddie Mc, CCC-SLP Speech Language Pathologist    Wende Bushy 08/18/2021, 2:51 PM

## 2021-08-18 NOTE — Telephone Encounter (Signed)
Letter up front for pick up. [name] [name](DPR) notified.

## 2021-08-18 NOTE — Progress Notes (Signed)
Pt still presenting with difficulty swallowing some response to voice but weak. Responsive to sternal rub. Speech at bedside recommended NPO. MD informed. Discussed next steps in plan of care with family- family agreed/felt appropriate to transition patient to comfort care measures. Both POA's present at bedside and caregiver. Will continue to monitor patient and provide family with support through transition.

## 2021-08-18 NOTE — Telephone Encounter (Signed)
We can do a letter but Education officer, museum at the hospital should also be able to help assist getting him into [facility] whether or not it is covered by his private insurance I am not certain but health team insurance it should be covered We will do a letter But I highly recommend having nurse coordinator/social worker help them with this issue

## 2021-08-18 NOTE — Telephone Encounter (Signed)
A letter was done please see previous messages

## 2021-08-18 NOTE — Progress Notes (Signed)
Palliative: Thank you for this consult. Unfortunately due to high volume of consults there will be a delay in a Palliative Provider seeing this patient. Palliative Medicine is anticipated to return to service on 10/6 and will see patient at that time.  No charge [name] Axe, NP Palliative Medicine Please call Palliative Medicine team phone with any questions [phone]. For individual providers please see AMION.

## 2021-08-19 ENCOUNTER — Telehealth: Payer: Self-pay | Admitting: Family Medicine

## 2021-08-19 DIAGNOSIS — Z515 Encounter for palliative care: Secondary | ICD-10-CM

## 2021-08-19 LAB — BLOOD CULTURE ID PANEL (REFLEXED) - BCID2

## 2021-08-19 MED ORDER — SCOPOLAMINE 1 MG/3DAYS TD PT72: 1.0000 | MEDICATED_PATCH | TRANSDERMAL | 0 refills | Status: AC

## 2021-08-19 MED ORDER — MORPHINE SULFATE 10 MG/5ML PO SOLN: 5.0000 mg | ORAL | 0 refills | Status: DC | PRN

## 2021-08-19 MED ORDER — LORAZEPAM 2 MG/ML PO CONC: 2.0000 mg | Freq: Four times a day (QID) | ORAL | 0 refills | Status: DC | PRN

## 2021-08-19 NOTE — Consult Note (Signed)
Family has decided on comfort measures with transfer to [facility].  Bed not available until 10/7.  Comfort measures in place per attending.  TOC team set up referral and transfer to hospice inpatient 10/7.  No immediate needs present.  Patient in no acute distress with comfort meds in place should symptoms indicate a need.  Will follow-up tomorrow 10/7 to determine any needs or changes in care-plan.   Kizzie Fantasia, MSN, RN-BC, [facility], [facility] Palliative Clinical Specialist [facility]

## 2021-08-19 NOTE — TOC Progression Note (Signed)
Transition of Care [facility]) - Progression Note    Patient Details  Name: [name] [name] MRN: [id number] Date of Birth: 1932-03-31  Transition of Care [facility]) CM/SW [place], [place] Phone Number: 08/19/2021, 1:40 PM  Clinical Narrative:    CSW updated by MD that pts family have chosen to go with comfort care measures and would like to have pt at the [facility]. CSW spoke to [place] with hospice who states that they will accept referral however they do not have currently have beds, pt will be added to the wait list. CSW spoke with pts family about this and explained that pt can go home with hospice care and then transition to the [facility] when a bed is available. Pts family agreeable to this. Pts hospital bed should be delivered this evening. MD to send medications to [facility] in [place] and they will be delivered to the home tomorrow afternoon. TOC to follow.   Expected Discharge Plan: Skilled Nursing Facility Barriers to Discharge: Ship broker  Expected Discharge Plan and Services Expected Discharge Plan: [place] In-house Referral: Clinical Social Work Discharge Planning Services: CM Consult Post Acute Care Choice: [place] Living arrangements for the past 2 months: Single Family Home                                       Social Determinants of Health (SDOH) Interventions    Readmission Risk Interventions No flowsheet data found.

## 2021-08-19 NOTE — Telephone Encounter (Signed)
Nile Dear called and wanted to cx any future appointments for patient. She states he is under hospice care.  CB# [phone]

## 2021-08-19 NOTE — Progress Notes (Signed)
PROGRESS NOTE    [name] [name]  [id number] DOB: [birth year] DOA: 08/14/2021 PCP: Kathyrn Drown, MD    Brief Narrative:  [name] [name] is a 87 year old male with past medical history significant for chronic systolic congestive heart failure, Alzheimer's dementia, paroxysmal atrial fibrillation, BPH, CAD, aortic stenosis s/p TAVR, type 2 diabetes mellitus, GERD, hyperlipidemia, CKD stage IIIb who presented to [facility] ED via EMS from home with weakness, inability to ambulate.  Patient's niece at bedside and gives most of history.  Review of EMR notable for advancing dementia, now reported end-stage by PCP.  Niece reports patient normally ambulates with a walker at baseline.  He has been progressively getting more weak and his home health aide was unable to get him up and get into the bath.  Patient currently lives alone at home with assistance from a home health aide occasionally.  Given his progressive disease and his inability to care for himself at home, patient was admitted for further evaluation, IV fluid hydration for dehydration and likely need of placement.  In the ED, temperature 97.5 F, HR 75, RR 20, BP 136/68, SPO2 100% on room air.  Sodium 131, potassium 4.9, chloride 99, CO2 22, glucose 286, BUN 29, creatinine 1.25.  Lactic acid 2.5.  WBC 7.8, hemoglobin 15.5, platelets 167.  Digoxin 0.2.  COVID-19 PCR negative.  Influenza A/B PCR negative.  Urinalysis unrevealing.  Chest x-ray with no acute cardiopulmonary disease process.  EKG with atrial fibrillation, rate 72, LBBB, QTC 428, no concerning dynamic changes; unchanged in comparison to prior tracing.  Patient was started on IV fluid hydration.  Duration consulted for further evaluation and management of dehydration, lactic acidosis, and generalized weakness/debility.   Assessment & Plan:   Principal Problem:   Generalized weakness Active Problems:   Hypertension   Atrial fibrillation (HCC)   Hyperlipidemia   Aortic  stenosis   DM (diabetes mellitus), type 2 with renal complications (HCC)   Current use of long term anticoagulation   Diabetic peripheral neuropathy (HCC)   S/P TAVR (transcatheter aortic valve replacement)   Mitral regurgitation   Chronic systolic heart failure (HCC)   CKD stage 3 due to type 2 diabetes mellitus ([place])   Dementia ([place])   Dehydration   Addendum: 3267 Updated patient's niece and nephew [name] and Richardson Landry via telephone.  Patient with continued progressive decline over the past 2 days, now somnolent and lethargic.  Chest x-ray with findings of aspiration pneumonia on the right and CT head with concerning hemorrhage within the pons.  Discussed with family about continued aggressive measures with IV antibiotics, labs, MRI and given his advanced age and worsening dementia they wish to transition care to a more comfort approach.  We will discontinue all lab draws, imaging studies, unnecessary medications.  Family does wish for transition if needed to residential hospice in [place].   Acute metabolic encephalopathy Aspiration pneumonia Concern for pontine hemorrhage During the hospitalization, patient has become more somnolent, lethargic with coarse respiratory sounds. Chest x-ray this morning notable for interval finding of right-sided consolidation concerning for aspiration pneumonia.  CT head without contrast with concern for hemorrhage within the pons.  Urinalysis unrevealing.  Blood cultures x2 with 1 out of 4 with GPC's, likely contaminant.  Initially started on Unasyn and normal saline bolus.  Given his acute decline, discussed with patient's niece and nephew at bedside and determined best course moving forward is to transition to comfort measures as he has had a progressive decline over  the last several months. --Comfort measures, supportive care --Ativan/Haldol as needed for agitation, shortness of breath --Morphine IV for pain control, shortness of breath --Scopolamine  patch/Robinul for excessive secretions --SW for residential hospice referral, if no beds anticipate discharge home with home hospice once equipment available --Overall very poor prognosis with likely less than 2 weeks of life remaining given his lack of oral intake, somnolence lethargy.  Generalized weakness, debility, deconditioning: Dehydration: Lactic acidosis Patient presenting to the ED with acute weakness, in the setting of progressive generalized debility, deconditioning, and weakness.  Patient with underlying end-stage Alzheimer's dementia which is likely significant contributing factor.  Patient with notable elevation of lactic acid of 2.5 likely secondary to dehydration from poor oral intake.  Patient is afebrile without leukocytosis, LFTs within normal limits.   Patient currently living alone with intermittent use of aids. Given patient's progressive decline during hospitalization, now transitioned to comfort measures.  CKD stage IIIb Creatinine 1.25 on admission.  Creatinine baseline 1.1-1.2.  Patient with reported poor oral intake in the preceding days.  Received IV fluid hydration which has now been discontinued with normalization of creatinine.  Now on comfort measures as above.  Discontinued all lab draws.  Chronic systolic congestive heart failure, compensated TTE 05/04/2020 with LVEF 35-40%, LV with moderate decreased function with global hypokinesis, increased PASP, LA severely dilated, RA moderately dilated, moderate MR, noted prosthetic TAVR, IVC within normal limits.  Follows with cardiology outpatient, Dr. Haroldine Laws.  On bisoprolol, digoxin, spironolactone, Imdur outpatient, all of which has been discontinued as transition to comfort measures.  Hx severe AS s/p TAVR Follows with cardiology, [name] outpatient.  TTE 2021 with prosthetic TAVR valve present in the aortic position.  CAD Discontinue Crestor on comfort measures  Chronic atrial fibrillation Discontinue  bisoprolol now on comfort measures.  Not on anticoagulation outpatient; and now with possible pontine hemorrhage as above.  Type 2 diabetes mellitus, with hyperglycemia Home regimen includes Lantus 34 units subcutaneously daily, metformin 250mg  PO BID. hemoglobin A1c 8.6 on 04/22/2021, not optimally controlled.  Insulin and glucose monitoring now discontinued as transition to comfort measures.  End-stage Alzheimer's dementia Progress significantly, on olanzapine, Xanax at home; which have now been discontinued.  Continue Haldol, Ativan, morphine as above for symptomatic relief.   DVT prophylaxis:   Comfort measures   Code Status: DNR Family Communication: Updated patient's family present at bedside this morning  Disposition Plan:  Level of care: Telemetry Status is: Inpatient  Remains inpatient appropriate because:Unsafe d/c plan  Dispo: The patient is from: Home              Anticipated d/c is to:  Residential hospice versus home with hospice                Patient currently is medically stable to d/c.   Difficult to place patient No   Consultants:  Palliative care  Procedures:  none  Antimicrobials:  Unasyn 10/5 - 10/5   Subjective: Patient seen examined at bedside, somnolent.  Waxing and waning mental status.  Currently nonresponsive to commands.  Multiple family members present at bedside.  Overall very grim/poor prognosis with less than 2 weeks of life remaining.  Social work attempting to obtain residential hospice placement; if unsuccessful anticipate discharge home with home hospice services until bed available.  May need home equipment, social work/home hospice agency to evaluate.    Objective: Vitals:   08/18/21 1056 08/18/21 1433 08/18/21 2115 08/19/21 0434  BP: (!) 155/53 128/76 (!) 130/52  129/68  Pulse: 84 82 78 68  Resp: (!) 26 14 20 20   Temp:   (!) 97.4 F (36.3 C) 98.2 F (36.8 C)  TempSrc:   Oral Oral  SpO2: 95% 96% 96% 92%  Weight:      Height:         Intake/Output Summary (Last 24 hours) at 08/19/2021 1249 Last data filed at 08/19/2021 0900 Gross per 24 hour  Intake 859.79 ml  Output 700 ml  Net 159.79 ml   Filed Weights   08/16/21 0500 08/17/21 0500 08/18/21 0500  Weight: 95.2 kg 91.9 kg 94.2 kg    Examination:  General exam: Somnolent, lethargic, elderly/chronically ill in appearance, nonresponsive to verbal command; NAD Respiratory system: Coarse breath sounds bilaterally no wheezing on room air Cardiovascular system: S1 & S2 heard, irregularly irregular rhythm, normal rate. No JVD, murmurs, rubs, gallops or clicks. No pedal edema. Gastrointestinal system: Abdomen is nondistended, soft and nontender. No organomegaly or masses felt. Normal bowel sounds heard. Central nervous system: Somnolent Extremities: No peripheral edema Skin: No rashes, lesions or ulcers Psychiatry: Unable to assess due to current mental status    Data Reviewed: I have personally reviewed following labs and imaging studies  CBC: Recent Labs  Lab 08/14/21 1751 08/15/21 0541 08/18/21 1030  WBC 7.8 8.1 10.2  NEUTROABS  --  6.1  --   HGB 15.5 14.8 13.6  HCT 45.4 43.9 41.2  MCV 93.4 93.4 96.5  PLT 167 153 035   Basic Metabolic Panel: Recent Labs  Lab 08/14/21 1751 08/15/21 0541 08/16/21 0649 08/18/21 1030  NA 131* 136 136 135  K 4.9 4.2 4.1 4.5  CL 99 103 105 102  CO2 22 26 24 24   GLUCOSE 286* 251* 206* 226*  BUN 29* 24* 20 30*  CREATININE 1.25* 1.10 0.96 1.15  CALCIUM 9.0 9.1 8.9 8.9  MG  --  1.8 1.7 1.8   GFR: Estimated Creatinine Clearance: 51.1 mL/min (by C-G formula based on SCr of 1.15 mg/dL). Liver Function Tests: Recent Labs  Lab 08/15/21 0541 08/18/21 1030  AST 24 47*  ALT 25 30  ALKPHOS 80 90  BILITOT 0.8 1.6*  PROT 7.1 7.3  ALBUMIN 3.8 3.7   No results for input(s): LIPASE, AMYLASE in the last 168 hours. No results for input(s): AMMONIA in the last 168 hours. Coagulation Profile: No results for input(s):  INR, PROTIME in the last 168 hours. Cardiac Enzymes: No results for input(s): CKTOTAL, CKMB, CKMBINDEX, TROPONINI in the last 168 hours. BNP (last 3 results) No results for input(s): PROBNP in the last 8760 hours. HbA1C: No results for input(s): HGBA1C in the last 72 hours.  CBG: Recent Labs  Lab 08/17/21 1106 08/17/21 1650 08/17/21 2046 08/18/21 0759 08/18/21 1035  GLUCAP 237* 222* 166* 184* 205*   Lipid Profile: No results for input(s): CHOL, HDL, LDLCALC, TRIG, CHOLHDL, LDLDIRECT in the last 72 hours. Thyroid Function Tests: No results for input(s): TSH, T4TOTAL, FREET4, T3FREE, THYROIDAB in the last 72 hours. Anemia Panel: No results for input(s): VITAMINB12, FOLATE, FERRITIN, TIBC, IRON, RETICCTPCT in the last 72 hours. Sepsis Labs: Recent Labs  Lab 08/14/21 1839 08/15/21 0541 08/16/21 0650 08/18/21 1030  PROCALCITON  --   --   --  0.12  LATICACIDVEN 2.5* 1.7 2.2*  --     Recent Results (from the past 240 hour(s))  Resp Panel by RT-PCR (Flu A&B, Covid) Nasopharyngeal Swab     Status: None   Collection Time: 08/14/21  6:23  PM   Specimen: Nasopharyngeal Swab; Nasopharyngeal(NP) swabs in vial transport medium  Result Value Ref Range Status   SARS Coronavirus 2 by RT PCR NEGATIVE NEGATIVE Final    Comment: (NOTE) SARS-CoV-2 target nucleic acids are NOT DETECTED.  The SARS-CoV-2 RNA is generally detectable in upper respiratory specimens during the acute phase of infection. The lowest concentration of SARS-CoV-2 viral copies this assay can detect is 138 copies/mL. A negative result does not preclude SARS-Cov-2 infection and should not be used as the sole basis for treatment or other patient management decisions. A negative result may occur with  improper specimen collection/handling, submission of specimen other than nasopharyngeal swab, presence of viral mutation(s) within the areas targeted by this assay, and inadequate number of viral copies(<138 copies/mL). A  negative result must be combined with clinical observations, patient history, and epidemiological information. The expected result is Negative.  Fact Sheet for Patients:  EntrepreneurPulse.com.au  Fact Sheet for Healthcare Providers:  IncredibleEmployment.be  This test is no t yet approved or cleared by the [place] FDA and  has been authorized for detection and/or diagnosis of SARS-CoV-2 by FDA under an Emergency Use Authorization (EUA). This EUA will remain  in effect (meaning this test can be used) for the duration of the COVID-19 declaration under Section 564(b)(1) of the Act, 21 U.S.C.section 360bbb-3(b)(1), unless the authorization is terminated  or revoked sooner.       Influenza A by PCR NEGATIVE NEGATIVE Final   Influenza B by PCR NEGATIVE NEGATIVE Final    Comment: (NOTE) The Xpert Xpress SARS-CoV-2/FLU/RSV plus assay is intended as an aid in the diagnosis of influenza from Nasopharyngeal swab specimens and should not be used as a sole basis for treatment. Nasal washings and aspirates are unacceptable for Xpert Xpress SARS-CoV-2/FLU/RSV testing.  Fact Sheet for Patients: EntrepreneurPulse.com.au  Fact Sheet for Healthcare Providers: IncredibleEmployment.be  This test is not yet approved or cleared by the [place] FDA and has been authorized for detection and/or diagnosis of SARS-CoV-2 by FDA under an Emergency Use Authorization (EUA). This EUA will remain in effect (meaning this test can be used) for the duration of the COVID-19 declaration under Section 564(b)(1) of the Act, 21 U.S.C. section 360bbb-3(b)(1), unless the authorization is terminated or revoked.  Performed at [facility], [address] [place], [place] [address]   Urine Culture     Status: None   Collection Time: 08/14/21  9:00 PM   Specimen: Urine, Clean Catch  Result Value Ref Range Status   Specimen Description    Final    URINE, CLEAN CATCH Performed at [facility], [address] [place], [place] [address]    Special Requests   Final    NONE Performed at [facility], [address] [place], [place] [address]    Culture   Final    NO GROWTH Performed at [facility], [place] [address] [place], [place] [address]    Report Status 08/16/2021 FINAL  Final  Culture, blood (routine x 2)     Status: None (Preliminary result)   Collection Time: 08/18/21 10:27 AM   Specimen: BLOOD RIGHT HAND  Result Value Ref Range Status   Specimen Description BLOOD RIGHT HAND  Final   Special Requests   Final    BOTTLES DRAWN AEROBIC AND ANAEROBIC Blood Culture adequate volume   Culture   Final    NO GROWTH < 24 HOURS Performed at [facility], [address] [place],  [address]  Report Status PENDING  Incomplete  Culture, blood (routine x 2)     Status: None (Preliminary result)   Collection Time: 08/18/21 10:29 AM   Specimen: BLOOD LEFT FOREARM  Result Value Ref Range Status   Specimen Description   Final    BLOOD LEFT FOREARM Performed at [facility], [address], [place], [place] [address]    Special Requests   Final    BOTTLES DRAWN AEROBIC AND ANAEROBIC Blood Culture adequate volume Performed at [facility], [address] [place], [place] [address]    Culture  Setup Time   Final    GRAM POSITIVE COCCI ANAEROBIC BOTTLE Gram Stain Report Called to,Read Back By and Verified With: THOMAS, [id number]  BY MATTHEWS, B 10.6.22 CRITICAL RESULT CALLED TO, READ BACK BY AND VERIFIED WITH: G,COFFEE PHARMD @0941  08/19/21 EB Performed at [facility], [place] [address] [place], [place] [address]    Culture GRAM POSITIVE COCCI  Final   Report Status PENDING  Incomplete  Blood Culture ID Panel (Reflexed)     Status: Abnormal   Collection Time: 08/18/21 10:29 AM  Result Value Ref Range Status   Enterococcus faecalis NOT DETECTED NOT DETECTED Final   Enterococcus Faecium NOT DETECTED NOT  DETECTED Final   Listeria monocytogenes NOT DETECTED NOT DETECTED Final   Staphylococcus species DETECTED (A) NOT DETECTED Final    Comment: CRITICAL RESULT CALLED TO, READ BACK BY AND VERIFIED WITH: G,COFFEE PHARMD @0941  08/19/21 EB    Staphylococcus aureus (BCID) NOT DETECTED NOT DETECTED Final   Staphylococcus epidermidis DETECTED (A) NOT DETECTED Final    Comment: CRITICAL RESULT CALLED TO, READ BACK BY AND VERIFIED WITH: G,COFFEE PHARMD @0941  08/19/21 EB    Staphylococcus lugdunensis NOT DETECTED NOT DETECTED Final   Streptococcus species NOT DETECTED NOT DETECTED Final   Streptococcus agalactiae NOT DETECTED NOT DETECTED Final   Streptococcus pneumoniae NOT DETECTED NOT DETECTED Final   Streptococcus pyogenes NOT DETECTED NOT DETECTED Final   A.calcoaceticus-baumannii NOT DETECTED NOT DETECTED Final   Bacteroides fragilis NOT DETECTED NOT DETECTED Final   Enterobacterales NOT DETECTED NOT DETECTED Final   Enterobacter cloacae complex NOT DETECTED NOT DETECTED Final   Escherichia coli NOT DETECTED NOT DETECTED Final   Klebsiella aerogenes NOT DETECTED NOT DETECTED Final   Klebsiella oxytoca NOT DETECTED NOT DETECTED Final   Klebsiella pneumoniae NOT DETECTED NOT DETECTED Final   Proteus species NOT DETECTED NOT DETECTED Final   Salmonella species NOT DETECTED NOT DETECTED Final   Serratia marcescens NOT DETECTED NOT DETECTED Final   Haemophilus influenzae NOT DETECTED NOT DETECTED Final   Neisseria meningitidis NOT DETECTED NOT DETECTED Final   Pseudomonas aeruginosa NOT DETECTED NOT DETECTED Final   Stenotrophomonas maltophilia NOT DETECTED NOT DETECTED Final   Candida albicans NOT DETECTED NOT DETECTED Final   Candida auris NOT DETECTED NOT DETECTED Final   Candida glabrata NOT DETECTED NOT DETECTED Final   Candida krusei NOT DETECTED NOT DETECTED Final   Candida parapsilosis NOT DETECTED NOT DETECTED Final   Candida tropicalis NOT DETECTED NOT DETECTED Final   Cryptococcus  neoformans/gattii NOT DETECTED NOT DETECTED Final   Methicillin resistance mecA/C NOT DETECTED NOT DETECTED Final    Comment: Performed at [facility], [address], [place], [place] [address]  Urine Culture     Status: Abnormal (Preliminary result)   Collection Time: 08/18/21 11:32 AM   Specimen: Urine, Clean Catch  Result Value Ref Range Status   Specimen Description   Final  URINE, CLEAN CATCH Performed at [facility], [address] [place], [place] [address]    Special Requests   Final    NONE Performed at [facility], [address], [place], [place] [address]    Culture (A)  Final    >=100,000 COLONIES/mL MORGANELLA MORGANII SUSCEPTIBILITIES TO FOLLOW Performed at [facility], [place] [address] [place],  [address]    Report Status PENDING  Incomplete         Radiology Studies: CT HEAD WO CONTRAST (5MM)  Result Date: 08/18/2021 CLINICAL DATA:  Mental status change, persistent or worsening. Metabolic encephalopathy. EXAM: CT HEAD WITHOUT CONTRAST TECHNIQUE: Contiguous axial images were obtained from the base of the skull through the vertex without intravenous contrast. COMPARISON:  08/31/2020 CT.  04/03/2012 MRI. FINDINGS: Brain: Chronic cerebellar nuclear calcification. Old small vessel cerebellar infarctions as seen previously. Chronic small-vessel ischemic changes of the pons. Question punctate hemorrhagic small vessel infarction in the right para median pons. This is not definite. Chronic volume loss of the anterior temporal lobes right more than left and of the left lateral temporal lobe. These could be due to old infarctions or old head trauma. Cerebral hemispheres otherwise show atrophy and chronic small-vessel ischemic changes of the white matter. No hydrocephalus or extra-axial collection. Vascular: There is atherosclerotic calcification of the major vessels at the base of the brain. Skull: Negative Sinuses/Orbits: Clear/normal Other: None  IMPRESSION: Chronic small-vessel ischemic changes throughout the brain as outlined above. Question acute punctate hemorrhagic infarction in the right para median pons. This is not definite. Consider MRI for confirmation. Atrophy and encephalomalacia of both anterior temporal lobes right more than left and of the lateral left temporal lobe. These could be due to old infarctions or old head trauma. Electronically Signed   By: Nelson Chimes M.D.   On: 08/18/2021 13:15   DG CHEST PORT 1 VIEW  Result Date: 08/18/2021 CLINICAL DATA:  Shortness of breath. EXAM: PORTABLE CHEST 1 VIEW COMPARISON:  August 14, 2021. FINDINGS: The heart size and mediastinal contours are within normal limits. Status post transcatheter aortic valve repair. Mild bibasilar subsegmental atelectasis is noted. The visualized skeletal structures are unremarkable. IMPRESSION: Mild bibasilar subsegmental atelectasis. Electronically Signed   By: Marijo Conception M.D.   On: 08/18/2021 13:20        Scheduled Meds:  scopolamine  1 patch Transdermal Q72H   Continuous Infusions:      LOS: 4 days    Time spent: 26 minutes spent on chart review, discussion with nursing staff, consultants, updating family and interview/physical exam; more than 50% of that time was spent in counseling and/or coordination of care.    Fay Bagg J [place] DO [facility] Available via Epic secure chat 7am-7pm After these hours, please refer to coverage provider listed on [email or web address] 08/19/2021, 12:49 PM

## 2021-08-19 NOTE — Progress Notes (Signed)
Date and time results received: 08/19/21 0650   Test: blood culture anaerobic  Critical Value: gram + cocci  Name of Provider Notified: Eric [place] MD  08/19/2021 0655 Orders Received? Or Actions Taken?:  No action taken. Pt currently on comfort care.

## 2021-08-19 NOTE — Telephone Encounter (Signed)
Samantha from Hospice called and asked did Dr. Wolfgang Phoenix want to continue to be patients attending physician or have one of the hospice providers.  CB# [phone]

## 2021-08-19 NOTE — Telephone Encounter (Signed)
Please advise. Thank you

## 2021-08-19 NOTE — Plan of Care (Signed)

## 2021-08-19 NOTE — Progress Notes (Signed)
PHARMACY - PHYSICIAN COMMUNICATION CRITICAL VALUE ALERT - BLOOD CULTURE IDENTIFICATION (BCID)  [name] [name] is an 87 y.o. male who presented to [facility] on 08/14/2021 with a chief complaint of aspiration pna  Assessment:  + BCID, 1 out of 4 bottles, positive for gram + cocci, BCID staph species - staph epi mec a not detected. Contaminant likely(include suspected source if known)  Name of physician (or Provider) Contacted: [place]  Current antibiotics: comfort care    Changes to prescribed antibiotics recommended:  Comfort care at this time   Results for orders placed or performed during the hospital encounter of 08/14/21  Blood Culture ID Panel (Reflexed) (Collected: 08/18/2021 10:29 AM)  Result Value Ref Range   Enterococcus faecalis NOT DETECTED NOT DETECTED   Enterococcus Faecium NOT DETECTED NOT DETECTED   Listeria monocytogenes NOT DETECTED NOT DETECTED   Staphylococcus species DETECTED (A) NOT DETECTED   Staphylococcus aureus (BCID) NOT DETECTED NOT DETECTED   Staphylococcus epidermidis DETECTED (A) NOT DETECTED   Staphylococcus lugdunensis NOT DETECTED NOT DETECTED   Streptococcus species NOT DETECTED NOT DETECTED   Streptococcus agalactiae NOT DETECTED NOT DETECTED   Streptococcus pneumoniae NOT DETECTED NOT DETECTED   Streptococcus pyogenes NOT DETECTED NOT DETECTED   A.calcoaceticus-baumannii NOT DETECTED NOT DETECTED   Bacteroides fragilis NOT DETECTED NOT DETECTED   Enterobacterales NOT DETECTED NOT DETECTED   Enterobacter cloacae complex NOT DETECTED NOT DETECTED   Escherichia coli NOT DETECTED NOT DETECTED   Klebsiella aerogenes NOT DETECTED NOT DETECTED   Klebsiella oxytoca NOT DETECTED NOT DETECTED   Klebsiella pneumoniae NOT DETECTED NOT DETECTED   Proteus species NOT DETECTED NOT DETECTED   Salmonella species NOT DETECTED NOT DETECTED   Serratia marcescens NOT DETECTED NOT DETECTED   Haemophilus influenzae NOT DETECTED NOT DETECTED   Neisseria meningitidis  NOT DETECTED NOT DETECTED   Pseudomonas aeruginosa NOT DETECTED NOT DETECTED   Stenotrophomonas maltophilia NOT DETECTED NOT DETECTED   Candida albicans NOT DETECTED NOT DETECTED   Candida auris NOT DETECTED NOT DETECTED   Candida glabrata NOT DETECTED NOT DETECTED   Candida krusei NOT DETECTED NOT DETECTED   Candida parapsilosis NOT DETECTED NOT DETECTED   Candida tropicalis NOT DETECTED NOT DETECTED   Cryptococcus neoformans/gattii NOT DETECTED NOT DETECTED   Methicillin resistance mecA/C NOT DETECTED NOT DETECTED    [name] 08/19/2021  9:59 AM

## 2021-08-20 ENCOUNTER — Ambulatory Visit: Payer: PPO | Admitting: Family Medicine

## 2021-08-20 MED ORDER — MORPHINE SULFATE (PF) 2 MG/ML IV SOLN
2.0000 mg | Freq: Once | INTRAVENOUS | Status: AC
Start: 2021-08-20 — End: 2021-08-20
  Administered 2021-08-20: 2 mg via INTRAMUSCULAR
  Filled 2021-08-20: qty 1

## 2021-08-20 MED ORDER — LORAZEPAM 2 MG/ML PO CONC: 2.0000 mg | Freq: Four times a day (QID) | ORAL | 0 refills | Status: AC | PRN

## 2021-08-20 MED ORDER — MORPHINE SULFATE 10 MG/5ML PO SOLN: 5.0000 mg | ORAL | 0 refills | Status: AC | PRN

## 2021-08-20 NOTE — TOC Transition Note (Signed)
Transition of Care [facility]) - CM/SW Discharge Note   Patient Details  Name: [name] [name] MRN: [id number] Date of Birth: 21-Sep-1932  [facility]) CM/SW Contact:  Natasha Bence, LCSW Phone Number: 08/20/2021, 11:09 AM   Clinical Narrative:    CSW notified of patient's readiness for discharge. Patient's family requested for meds to be sent to [facility]. MD reported that meds have been sent to requested pharmacy. [facility] agree to provide hospice RN upon discharge. CSW scheduled EMS. TOC signing off.    Final next level of care: Home w Hospice Care Barriers to Discharge: Barriers Resolved   Patient Goals and CMS Choice Patient states their goals for this hospitalization and ongoing recovery are:: Return home with home hospice CMS Medicare.gov Compare Post Acute Care list provided to:: Patient Choice offered to / list presented to : Patient  Discharge Placement                Patient to be transferred to facility by: [facility] Name of family member notified: Nile Dear (Niece)   [phone] Patient and family notified of of transfer: 08/20/21  Discharge Plan and Services In-house Referral: Clinical Social Work Discharge Planning Services: [employer] Consult Post Acute Care Choice: [facility]                               Social Determinants of Health (SDOH) Interventions     Readmission Risk Interventions No flowsheet data found.

## 2021-08-20 NOTE — Telephone Encounter (Signed)
I called and left a message If he is going to be at his home I can be a hospice attending but if he is going to be in a nursing facility or in the hospice home it would be best for the hospice attending to be his attending Aldona Bar supposed to call me back thank you

## 2021-08-21 LAB — CULTURE, BLOOD (ROUTINE X 2): Special Requests: ADEQUATE

## 2021-08-21 LAB — URINE CULTURE: Culture: >=100000 — AB

## 2021-08-21 NOTE — Progress Notes (Signed)
EMS picked up patient.  Discharged home with hospice.

## 2021-08-23 ENCOUNTER — Ambulatory Visit (INDEPENDENT_AMBULATORY_CARE_PROVIDER_SITE_OTHER): Admitting: *Deleted

## 2021-08-23 ENCOUNTER — Telehealth: Payer: Self-pay | Admitting: *Deleted

## 2021-08-23 ENCOUNTER — Telehealth

## 2021-08-23 DIAGNOSIS — G301 Alzheimer's disease with late onset: Secondary | ICD-10-CM

## 2021-08-23 DIAGNOSIS — F02818 Dementia in other diseases classified elsewhere, unspecified severity, with other behavioral disturbance: Secondary | ICD-10-CM

## 2021-08-23 DIAGNOSIS — R531 Weakness: Secondary | ICD-10-CM

## 2021-08-23 LAB — CULTURE, BLOOD (ROUTINE X 2)
Culture: NO GROWTH
Special Requests: ADEQUATE

## 2021-08-23 NOTE — Telephone Encounter (Signed)
error 

## 2021-08-23 NOTE — Chronic Care Management (AMB) (Signed)
Chronic Care Management    Clinical Social Work Note  08/23/2021 Name: [name] [name] MRN: [id number] DOB: 1932-01-17  [name] [name] is a 87 y.o. year old male who is a primary care patient of Luking, Elayne Snare, MD. The CCM team was consulted to assist the patient with chronic disease management and/or care coordination needs related to: [employer]  and Level of Care Concerns.   Engaged with patient by telephone for follow up visit in response to provider referral for social work chronic care management and care coordination services.   Consent to Services:  The patient was given information about Chronic Care Management services, agreed to services, and gave verbal consent prior to initiation of services.  Please see initial visit note for detailed documentation.   Patient agreed to services and consent obtained.   Assessment: Review of patient past medical history, allergies, medications, and health status, including review of relevant consultants reports was performed today as part of a comprehensive evaluation and provision of chronic care management and care coordination services.     SDOH (Social Determinants of Health) assessments and interventions performed:    Advanced Directives Status: See Care Plan for related entries.  CCM Care Plan  Allergies  Allergen Reactions   Ambien [Zolpidem Tartrate] Other (See Comments)    Sleep walks   Lipitor [Atorvastatin Calcium] Other (See Comments)    myalgias   Ranitidine Other (See Comments)    Chest discomfort   Simvastatin Other (See Comments)    Myalgias   Xanax Xr [Alprazolam Er] Other (See Comments)    Tightness in chest   Cholestatin Other (See Comments)    UNSPECIFIED REACTION    Clopidogrel Bisulfate Rash    Outpatient Encounter Medications as of 08/23/2021  Medication Sig   acetaminophen (TYLENOL) 325 MG tablet Take 650 mg by mouth every 6 (six) hours as needed for mild pain or moderate pain.   ALPRAZolam  (XANAX) 0.25 MG tablet Take 1 tablet (0.25 mg total) by mouth 3 (three) times daily as needed for anxiety.   LORazepam (ATIVAN) 2 MG/ML concentrated solution Take 1 mL (2 mg total) by mouth every 6 (six) hours as needed for up to 14 days for anxiety (shortness of breath, aggitation).   morphine 10 MG/5ML solution Take 2.5 mLs (5 mg total) by mouth every 2 (two) hours as needed for up to 7 days for severe pain or moderate pain.   scopolamine (TRANSDERM-SCOP) 1 MG/3DAYS Place 1 patch (1.5 mg total) onto the skin every 3 (three) days for 18 days.   No facility-administered encounter medications on file as of 08/23/2021.    Patient Active Problem List   Diagnosis Date Noted   Hospice care patient 08/19/2021   Dehydration 08/15/2021   Generalized weakness 08/14/2021   Pulmonary fibrosis ([place]) 12/25/2020   Peripheral vascular disease, unspecified ([place]) 10/21/2020   Late onset Alzheimer's disease with behavioral disturbance ([place]) 06/30/2020   CKD stage 3 due to type 2 diabetes mellitus ([place]) 05/15/2020   Diabetic peripheral neuropathy associated with type 2 diabetes mellitus ([place]) 05/15/2020   Hyperlipidemia associated with type 2 diabetes mellitus ([place]) 05/15/2020   Dementia ([place]) 05/15/2020   Hypokalemia 05/15/2020   Chronic insomnia 05/15/2020   Acute on chronic HFrEF (heart failure with reduced ejection fraction) ([place]) 05/11/2020   Acute on chronic systolic heart failure, NYHA class 3 ([place]) 05/11/2020   Senile purpura ([place]) 08/15/2018   Renal insufficiency 03/14/2018   Altered mental status 99/35/7017   Chronic systolic  heart failure ([place]) 02/09/2017   Controlled type 2 diabetes mellitus with hyperglycemia ([place]) 02/09/2017   Elevated troponin    S/P TAVR (transcatheter aortic valve replacement) 07/19/2016   Mitral regurgitation    VT (ventricular tachycardia)    Shortness of breath 06/28/2016   Coronary artery disease involving native coronary artery of native heart with angina  pectoris (HCC)    Acute on chronic combined systolic and diastolic CHF, NYHA class 4 (HCC)    Anemia of chronic disease 04/12/2016   Orthostatic hypotension 08/20/2015   AKI (acute kidney injury) ([place]) 08/20/2015   Chronic asthma 08/20/2015   Cognitive dysfunction 05/29/2015   Chronic back pain 07/29/2014   Lumbar pain 06/23/2014   Chronic pain of right ankle 05/30/2014   Diabetic peripheral neuropathy ([place]) 05/30/2014   Chest congestion 05/02/2013   Wheezing 05/02/2013   Chronic constipation 04/30/2013   Leg edema, left 04/27/2013   Current use of long term anticoagulation 02/16/2013   Carotid stenosis 04/25/2012   BPH (benign prostatic hypertrophy) with urinary obstruction 09/27/2011   Dyspnea on exertion 08/01/2011   DM (diabetes mellitus), type 2 with renal complications ([place]) 69/48/5462   Hypertension    Atrial fibrillation ([place])    Hyperlipidemia    History of noncompliance with medical treatment    Aortic stenosis    Cerebrovascular disease 07/15/2010    Conditions to be addressed/monitored: Dementia; Level of care concerns  Care Plan : LCSW Plan of Care  Updates made by Deirdre Peer, LCSW since 08/23/2021 12:00 AM     Problem: Mobility and Independence decline   Priority: High     Goal: Provide resources and support to aide in pt's care where his Mobility and Independence are declining Completed 08/23/2021  Start Date: 07/22/2021  Expected End Date: 08/23/2021  Recent Progress: On track  Priority: High  Note:   Current barriers:    ADL IADL limitations, Cognitive Deficits, Memory Deficits, Inability to perform ADL's independently, and Lacks knowledge of community resource:   Clinical Goals: Patient will work with CSW to address needs related to in-home support/resources Clinical Interventions:   Per niece, pt is now under Hospice care- will sign off.  1:1 collaboration with primary care provider regarding development and update of comprehensive plan of  care as evidenced by provider attestation and co-signature Inter-disciplinary care team collaboration (see longitudinal plan of care) Assessment of needs, barriers , agencies contacted, as well as how impacting Review various resources, discussed options and provided patient information about  Dementia resources and support  Home health request to resume/comtinue Supplies for home use Caregiver stress acknowledged , Increase in actives / exercise encouraged , and Collaborated with PCP Patient Goals/Self-Care Activities: Over the next 20 days Look for email regarding ordering of supplies/cost I will coordinate with PCP to assist with resuming home health physical therapy  begin a notebook of services in my neighborhood or community - call 211 when I need some help - follow-up on any referrals for help I am given - think ahead to make sure my need does not become an emergency - make a note about what I need to have by the phone or take with me, like an identification card or social security number have a back-up plan - have a back-up plan - make a list of family or friends that I can call          Follow Up Plan:  Pt under [place] to sign off.  Eduard Clos MSW, LCSW Licensed Clinical Social Worker [place] [phone]

## 2021-08-23 NOTE — Patient Instructions (Signed)
Visit Information  PATIENT GOALS:  Goals Addressed             This Visit's Progress    COMPLETED: Find Help in My Community       Timeframe:  Short-Term Goal Priority:  High Start Date:    07/22/21                         Expected End Date:             08/23/21          Follow Up Date   08/23/21          - begin a notebook of services in my neighborhood or community - call 211 when I need some help - follow-up on any referrals for help I am given - think ahead to make sure my need does not become an emergency - make a note about what I need to have by the phone or take with me, like an identification card or social security number have a back-up plan - have a back-up plan - make a list of family or friends that I can call    Why is this important?   Knowing how and where to find help for yourself or family in your neighborhood and community is an important skill.  You will want to take some steps to learn how.    Notes:         The patient verbalized understanding of instructions, educational materials, and care plan provided today and declined offer to receive copy of patient instructions, educational materials, and care plan.   No further follow up required:    Signature.Eduard Clos MSW, LCSW Licensed Clinical Social Worker [facility] [phone]   Visit Information  PATIENT GOALS:  Goals Addressed             This Visit's Progress    COMPLETED: Find Help in My Community       Timeframe:  Short-Term Goal Priority:  High Start Date:    07/22/21                         Expected End Date:             08/23/21          Follow Up Date   08/23/21          - begin a notebook of services in my neighborhood or community - call 211 when I need some help - follow-up on any referrals for help I am given - think ahead to make sure my need does not become an emergency - make a note about what I need to have by the phone or take with me, like  an identification card or social security number have a back-up plan - have a back-up plan - make a list of family or friends that I can call    Why is this important?   Knowing how and where to find help for yourself or family in your neighborhood and community is an important skill.  You will want to take some steps to learn how.    Notes:         The patient verbalized understanding of instructions, educational materials, and care plan provided today and declined offer to receive copy of patient instructions, educational materials, and care plan.   No further follow up required:    Marcie Bal  [place], [place] Licensed Music therapist Family Medicine [phone]

## 2021-08-24 ENCOUNTER — Ambulatory Visit: Payer: PPO | Admitting: Family Medicine

## 2021-08-27 ENCOUNTER — Ambulatory Visit (HOSPITAL_COMMUNITY): Payer: PPO

## 2021-08-27 ENCOUNTER — Telehealth: Payer: Self-pay | Admitting: Family Medicine

## 2021-08-27 ENCOUNTER — Encounter (HOSPITAL_COMMUNITY): Payer: PPO | Admitting: Internal Medicine

## 2021-08-27 NOTE — Telephone Encounter (Signed)
error 

## 2021-09-01 ENCOUNTER — Telehealth: Payer: Self-pay | Admitting: Family Medicine

## 2021-09-01 MED ORDER — CEPHALEXIN 500 MG PO CAPS
ORAL_CAPSULE | ORAL | 0 refills | Status: DC
Start: 2021-09-01 — End: 2021-09-30

## 2021-09-01 NOTE — Telephone Encounter (Signed)
Keflex 500 mg 1 taken 3 times daily for 7 days

## 2021-09-01 NOTE — Telephone Encounter (Signed)
Hospice nurse calling to inform that family states the last couple of mornings family has noticed mucus in foley bag and pt has complained of feeling the urge to urinate but not being able to urinate and having a burning feeling. Pt felt clammy yesterday and began to sweat. Temp this morning 98.4 when hospice nurse checked it. Family/ hospice requesting antibiotic to be called into [place]. Please advise. Thank you (Call [place] back with information)

## 2021-09-01 NOTE — Addendum Note (Signed)
Addended by: Vicente Males on: 09/01/2021 10:49 AM   Modules accepted: Orders

## 2021-09-01 NOTE — Telephone Encounter (Signed)
Medication sent to pharmacy and niece [name] is aware

## 2021-09-06 ENCOUNTER — Telehealth: Payer: Self-pay | Admitting: Family Medicine

## 2021-09-06 ENCOUNTER — Ambulatory Visit: Payer: Self-pay | Admitting: *Deleted

## 2021-09-06 DIAGNOSIS — N1832 Chronic kidney disease, stage 3b: Secondary | ICD-10-CM

## 2021-09-06 DIAGNOSIS — E1122 Type 2 diabetes mellitus with diabetic chronic kidney disease: Secondary | ICD-10-CM

## 2021-09-06 DIAGNOSIS — I5022 Chronic systolic (congestive) heart failure: Secondary | ICD-10-CM

## 2021-09-06 NOTE — Telephone Encounter (Signed)
Lorazepam not currently on med list. Please advise. Thank you

## 2021-09-06 NOTE — Chronic Care Management (AMB) (Signed)
   09/06/2021  Deklan Minar Joe March 06, 1932 [id number]   Received notification patient is now under hospice care.  Care plan completed and case closed.  Jacqlyn Larsen [facility], BSN RN Case Manager [facility] [phone]

## 2021-09-06 NOTE — Telephone Encounter (Signed)
[name] [name] from [facility] stated that patient needed lorazepam refilled. He is taking 1 mil every 6 hours.  [name]  [phone]

## 2021-09-06 NOTE — Telephone Encounter (Signed)
Please talk with the hospice nurse to clarify is the lorazepam liquid?  Or pill.  If she could tell us the concentration and the amount that is typically sent in then I will be happy to send this in on Tuesday thank you

## 2021-09-07 NOTE — Telephone Encounter (Signed)
[name] [name] w/ hospice returned the call to clarify prescription request is lorazepam 2 mg/ ml concentrate. Previously filled by a covering provider. Please refill 2mg / 1 ml Q 6hrs PRN anxiety. [employer].

## 2021-09-08 ENCOUNTER — Other Ambulatory Visit: Payer: Self-pay | Admitting: Family Medicine

## 2021-09-08 MED ORDER — LORAZEPAM 2 MG/ML PO CONC: ORAL | 0 refills | Status: DC

## 2021-09-08 NOTE — Telephone Encounter (Signed)
Prescription sent to [facility]

## 2021-09-08 NOTE — Telephone Encounter (Signed)
Prescription was sent to [facility]

## 2021-09-13 DIAGNOSIS — I5022 Chronic systolic (congestive) heart failure: Secondary | ICD-10-CM | POA: Diagnosis not present

## 2021-09-13 DIAGNOSIS — F02818 Dementia in other diseases classified elsewhere, unspecified severity, with other behavioral disturbance: Secondary | ICD-10-CM

## 2021-09-13 DIAGNOSIS — G301 Alzheimer's disease with late onset: Secondary | ICD-10-CM

## 2021-09-13 DIAGNOSIS — N1832 Chronic kidney disease, stage 3b: Secondary | ICD-10-CM | POA: Diagnosis not present

## 2021-09-13 DIAGNOSIS — E1122 Type 2 diabetes mellitus with diabetic chronic kidney disease: Secondary | ICD-10-CM

## 2021-09-13 DIAGNOSIS — Z794 Long term (current) use of insulin: Secondary | ICD-10-CM | POA: Diagnosis not present

## 2021-09-15 ENCOUNTER — Telehealth: Payer: PPO

## 2021-09-28 ENCOUNTER — Telehealth: Payer: Self-pay | Admitting: Family Medicine

## 2021-09-28 NOTE — Telephone Encounter (Signed)
Please advise. Thank you

## 2021-09-28 NOTE — Telephone Encounter (Signed)
Mandi- from Hospice wanting patient lorazepam liquid switched to pill form. And wanting it for 2 pills every 2 hours for anxiety. If any question please call [phone]

## 2021-09-28 NOTE — Telephone Encounter (Signed)
Patient was seen yesterday by hospice and nurse informed niece [name] that morphine was being ordered. Ms. [name] [name] called today to see if the orders had been put in. She just wanted to stay on top of this. Please advise.  CB#:  [phone]

## 2021-09-30 ENCOUNTER — Telehealth: Payer: Self-pay | Admitting: Family Medicine

## 2021-09-30 ENCOUNTER — Other Ambulatory Visit: Payer: Self-pay | Admitting: Family Medicine

## 2021-09-30 MED ORDER — MORPHINE SULFATE 10 MG/5ML PO SOLN: 5.0000 mg | ORAL | 0 refills | Status: DC | PRN

## 2021-09-30 MED ORDER — CEPHALEXIN 250 MG/5ML PO SUSR: ORAL | 0 refills | Status: AC

## 2021-09-30 MED ORDER — LORAZEPAM 2 MG/ML PO CONC: ORAL | 0 refills | Status: AC

## 2021-09-30 NOTE — Telephone Encounter (Signed)
Ms. Henrene Pastor called and inquired about patient's medications. States he has been without it for several days and he is in pain. Ms. Henrene Pastor would like someone to call her as soon as the medications have been approved and sent to the pharmacy.   [place]  CB#  [phone]

## 2021-09-30 NOTE — Telephone Encounter (Signed)
please let me speak with hospice nurse

## 2021-09-30 NOTE — Telephone Encounter (Signed)
[name] [name] contacted office. [name] [name] is needing Lorazepam concentrate and Morphine sulfate concentrate sent to [name]. [name] [name] is utilizing Morphine 20mg /ml-10 mg q 2 hours.  Karen-hospice nurse also states that she changed [name] [name] foley this morning and [name] [name] had lots of sediment, foul odor and [name] [name] had complaints regarding urgency. Please advise. Thank you. [name] would like a call back when this is done.

## 2021-09-30 NOTE — Telephone Encounter (Signed)
Contacted [place]. Patient needing lorazepam liquid switched to pill form. And wanting it for 2 pills every 2 hours for anxiety. Please advise. Thank you

## 2021-09-30 NOTE — Telephone Encounter (Signed)
I did speak with [name] I also spoke with hospice nurse Patient may well have UTI we will do liquid Keflex for 5 days Refills of liquid Ativan and liquid morphine was given after discussing with [facility]

## 2021-10-01 ENCOUNTER — Other Ambulatory Visit: Payer: Self-pay | Admitting: Family Medicine

## 2021-10-01 ENCOUNTER — Telehealth: Payer: Self-pay | Admitting: Family Medicine

## 2021-10-01 MED ORDER — MORPHINE SULFATE 5 MG/ML IJ SOLN: INTRAMUSCULAR | 0 refills | Status: AC

## 2021-10-01 NOTE — Telephone Encounter (Signed)
Santiago Glad from [facility] called in and states the morphine concentrate was not right. Needs to be Morphine 20 mg/ml. Pt getting 10 mg q 2 hrs prn. Please advise. Thank you  [name]

## 2021-10-01 NOTE — Telephone Encounter (Signed)
Hospice nurse contacted and informed

## 2021-10-01 NOTE — Telephone Encounter (Signed)
FYI-corrected prescription was sent in

## 2021-10-18 ENCOUNTER — Telehealth: Payer: Self-pay | Admitting: Family Medicine

## 2021-10-18 NOTE — Telephone Encounter (Signed)
FYI- Nile Dear called to let you know that he passed away on November 10, 2021 at [facility]

## 2021-10-19 NOTE — Telephone Encounter (Signed)
I did speak with family Please forward sympathy card for [place] to fill out thank you

## 2021-11-14 DEATH — deceased
